# Patient Record
Sex: Male | Born: 1935 | Race: White | Hispanic: No | Marital: Married | Smoking: Former smoker
Health system: Southern US, Community
[De-identification: ages and names within clinical notes are randomized; demographics above are authoritative.]

## PROBLEM LIST (undated history)

## (undated) DIAGNOSIS — I10 Essential (primary) hypertension: Secondary | ICD-10-CM

## (undated) DIAGNOSIS — I509 Heart failure, unspecified: Secondary | ICD-10-CM

## (undated) DIAGNOSIS — Z87442 Personal history of urinary calculi: Secondary | ICD-10-CM

## (undated) DIAGNOSIS — M21371 Foot drop, right foot: Secondary | ICD-10-CM

## (undated) DIAGNOSIS — I2510 Atherosclerotic heart disease of native coronary artery without angina pectoris: Secondary | ICD-10-CM

## (undated) DIAGNOSIS — M1990 Unspecified osteoarthritis, unspecified site: Secondary | ICD-10-CM

## (undated) DIAGNOSIS — G250 Essential tremor: Secondary | ICD-10-CM

## (undated) DIAGNOSIS — M25512 Pain in left shoulder: Secondary | ICD-10-CM

## (undated) DIAGNOSIS — E785 Hyperlipidemia, unspecified: Secondary | ICD-10-CM

## (undated) DIAGNOSIS — M542 Cervicalgia: Secondary | ICD-10-CM

## (undated) DIAGNOSIS — J449 Chronic obstructive pulmonary disease, unspecified: Secondary | ICD-10-CM

## (undated) DIAGNOSIS — I219 Acute myocardial infarction, unspecified: Secondary | ICD-10-CM

## (undated) DIAGNOSIS — I5030 Unspecified diastolic (congestive) heart failure: Secondary | ICD-10-CM

## (undated) DIAGNOSIS — G4730 Sleep apnea, unspecified: Secondary | ICD-10-CM

## (undated) HISTORY — DX: Acute myocardial infarction, unspecified: I21.9

## (undated) HISTORY — PX: TONSILLECTOMY: SUR1361

## (undated) HISTORY — PX: CORONARY ANGIOPLASTY: SHX604

## (undated) HISTORY — PX: KNEE ARTHROSCOPY: SUR90

## (undated) HISTORY — PX: COLONOSCOPY: SHX174

---

## 1996-09-02 HISTORY — PX: KNEE SURGERY: SHX244

## 2008-02-09 ENCOUNTER — Other Ambulatory Visit: Payer: Self-pay

## 2008-02-09 ENCOUNTER — Inpatient Hospital Stay: Payer: Self-pay | Admitting: Internal Medicine

## 2008-02-19 ENCOUNTER — Ambulatory Visit: Payer: Self-pay | Admitting: Cardiovascular Disease

## 2009-01-06 ENCOUNTER — Observation Stay: Payer: Self-pay | Admitting: Internal Medicine

## 2009-07-05 ENCOUNTER — Ambulatory Visit: Payer: Self-pay | Admitting: Gastroenterology

## 2010-06-02 HISTORY — PX: BACK SURGERY: SHX140

## 2010-06-04 ENCOUNTER — Ambulatory Visit: Payer: Self-pay | Admitting: Internal Medicine

## 2010-06-06 ENCOUNTER — Ambulatory Visit: Payer: Self-pay | Admitting: Cardiovascular Disease

## 2010-06-06 ENCOUNTER — Inpatient Hospital Stay (HOSPITAL_COMMUNITY): Admission: EM | Admit: 2010-06-06 | Discharge: 2010-06-13 | Payer: Self-pay | Admitting: Emergency Medicine

## 2010-11-15 LAB — BASIC METABOLIC PANEL
BUN: 28 mg/dL — ABNORMAL HIGH (ref 6–23)
CO2: 26 mEq/L (ref 19–32)
CO2: 31 mEq/L (ref 19–32)
CO2: 31 mEq/L (ref 19–32)
Calcium: 9.3 mg/dL (ref 8.4–10.5)
Chloride: 102 mEq/L (ref 96–112)
Chloride: 103 mEq/L (ref 96–112)
Creatinine, Ser: 1.19 mg/dL (ref 0.4–1.5)
Creatinine, Ser: 1.3 mg/dL (ref 0.4–1.5)
Glucose, Bld: 123 mg/dL — ABNORMAL HIGH (ref 70–99)
Glucose, Bld: 135 mg/dL — ABNORMAL HIGH (ref 70–99)
Glucose, Bld: 96 mg/dL (ref 70–99)
Potassium: 3.1 mEq/L — ABNORMAL LOW (ref 3.5–5.1)
Potassium: 3.4 mEq/L — ABNORMAL LOW (ref 3.5–5.1)
Sodium: 139 mEq/L (ref 135–145)

## 2010-11-15 LAB — URINALYSIS, ROUTINE W REFLEX MICROSCOPIC
Bilirubin Urine: NEGATIVE
Glucose, UA: NEGATIVE mg/dL
Ketones, ur: NEGATIVE mg/dL
Leukocytes, UA: NEGATIVE
Nitrite: NEGATIVE
Protein, ur: 100 mg/dL — AB
pH: 7 (ref 5.0–8.0)

## 2010-11-15 LAB — DIFFERENTIAL
Basophils Absolute: 0 10*3/uL (ref 0.0–0.1)
Eosinophils Absolute: 0 10*3/uL (ref 0.0–0.7)
Eosinophils Relative: 0 % (ref 0–5)
Monocytes Absolute: 1.5 10*3/uL — ABNORMAL HIGH (ref 0.1–1.0)

## 2010-11-15 LAB — CBC
HCT: 41.5 % (ref 39.0–52.0)
HCT: 44.7 % (ref 39.0–52.0)
Hemoglobin: 14.2 g/dL (ref 13.0–17.0)
Hemoglobin: 14.8 g/dL (ref 13.0–17.0)
Hemoglobin: 15.3 g/dL (ref 13.0–17.0)
MCH: 30.7 pg (ref 26.0–34.0)
MCH: 31.2 pg (ref 26.0–34.0)
MCH: 31.5 pg (ref 26.0–34.0)
MCH: 31.7 pg (ref 26.0–34.0)
MCHC: 33.8 g/dL (ref 30.0–36.0)
MCHC: 34.2 g/dL (ref 30.0–36.0)
MCHC: 35 g/dL (ref 30.0–36.0)
MCHC: 35.1 g/dL (ref 30.0–36.0)
MCV: 89.1 fL (ref 78.0–100.0)
MCV: 90.3 fL (ref 78.0–100.0)
MCV: 93 fL (ref 78.0–100.0)
Platelets: 150 10*3/uL (ref 150–400)
Platelets: 154 10*3/uL (ref 150–400)
RBC: 4.75 MIL/uL (ref 4.22–5.81)
RDW: 13.9 % (ref 11.5–15.5)

## 2010-11-15 LAB — COMPREHENSIVE METABOLIC PANEL
ALT: 214 U/L — ABNORMAL HIGH (ref 0–53)
Albumin: 3.5 g/dL (ref 3.5–5.2)
Alkaline Phosphatase: 60 U/L (ref 39–117)
Glucose, Bld: 105 mg/dL — ABNORMAL HIGH (ref 70–99)
Potassium: 3.1 mEq/L — ABNORMAL LOW (ref 3.5–5.1)
Sodium: 141 mEq/L (ref 135–145)
Total Protein: 6.2 g/dL (ref 6.0–8.3)

## 2010-11-15 LAB — PROTIME-INR: Prothrombin Time: 14.3 seconds (ref 11.6–15.2)

## 2012-11-30 ENCOUNTER — Inpatient Hospital Stay: Payer: Self-pay | Admitting: Internal Medicine

## 2012-11-30 LAB — CBC WITH DIFFERENTIAL/PLATELET
Basophil #: 0.1 x10 3/mm 3
Basophil %: 0.7 %
Eosinophil #: 0.2 x10 3/mm 3
Eosinophil %: 2.4 %
HCT: 38 % — ABNORMAL LOW
HGB: 13 g/dL
Lymphocyte %: 25.3 %
Lymphs Abs: 2.4 x10 3/mm 3
MCH: 31.3 pg
MCHC: 34.2 g/dL
MCV: 92 fL
Monocyte #: 0.8 "x10 3/mm "
Monocyte %: 9 %
Neutrophil #: 5.9 x10 3/mm 3
Neutrophil %: 62.6 %
Platelet: 136 x10 3/mm 3 — ABNORMAL LOW
RBC: 4.16 x10 6/mm 3 — ABNORMAL LOW
RDW: 14.6 % — ABNORMAL HIGH
WBC: 9.4 x10 3/mm 3

## 2012-11-30 LAB — COMPREHENSIVE METABOLIC PANEL
Albumin: 3.3 g/dL — ABNORMAL LOW (ref 3.4–5.0)
BUN: 22 mg/dL — ABNORMAL HIGH (ref 7–18)
Calcium, Total: 8.2 mg/dL — ABNORMAL LOW (ref 8.5–10.1)
Chloride: 111 mmol/L — ABNORMAL HIGH (ref 98–107)
Co2: 27 mmol/L (ref 21–32)
Creatinine: 1.17 mg/dL (ref 0.60–1.30)
Osmolality: 291 (ref 275–301)
SGPT (ALT): 36 U/L (ref 12–78)
Total Protein: 6.3 g/dL — ABNORMAL LOW (ref 6.4–8.2)

## 2012-12-02 LAB — COMPREHENSIVE METABOLIC PANEL
Alkaline Phosphatase: 64 U/L (ref 50–136)
Calcium, Total: 8.2 mg/dL — ABNORMAL LOW (ref 8.5–10.1)
Osmolality: 287 (ref 275–301)
Sodium: 142 mmol/L (ref 136–145)

## 2012-12-02 LAB — PRO B NATRIURETIC PEPTIDE: B-Type Natriuretic Peptide: 805 pg/mL — ABNORMAL HIGH (ref 0–450)

## 2013-04-14 DIAGNOSIS — M542 Cervicalgia: Secondary | ICD-10-CM | POA: Insufficient documentation

## 2013-04-22 DIAGNOSIS — M75120 Complete rotator cuff tear or rupture of unspecified shoulder, not specified as traumatic: Secondary | ICD-10-CM | POA: Insufficient documentation

## 2014-04-07 DIAGNOSIS — M21371 Foot drop, right foot: Secondary | ICD-10-CM | POA: Insufficient documentation

## 2014-12-23 NOTE — Discharge Summary (Signed)
PATIENT NAME:  Dylan [name], Dylan [name] MR#:  [id number] DATE OF BIRTH:  1936-04-25  DATE OF ADMISSION:  11/30/2012 DATE OF DISCHARGE:  12/02/2012  ADMITTING DIAGNOSIS: Shortness of breath, and generalized edema.   DISCHARGE DIAGNOSES: 1.  Shortness of breath, generalized edema likely due to diastolic congestive heart failure, previous history of having diastolic dysfunction; however, echo done during recent office visit showed no diastolic dysfunction.  2.  Morbid obesity.  3.  Hypertension.  4.  Hyperlipidemia.  5.  History of coronary artery disease.  6.  Ongoing tobacco abuse.  7.  Osteoarthritis.  8.  Possible sleep apnea   PERTINENT LABS AND EVALUATIONS: Admitting glucose 105, BUN 22. Creatinine was 1.17, sodium 144, potassium was 3.3, chloride 111, CO2 was 27, calcium 8.2.   LFTs showed a total protein of 6.3, an albumin of 3.3. The rest of the LFTs were negative.   WBC count was 9.4, hemoglobin 13, platelet count was 136.   PA and lateral chest x-ray showed minimal right lung bases mild interstitial prominence, suggesting possible CHF.   BNP was 805.  CONSULTANTS: Dr. Welton FlakesKhan of cardiology.   HOSPITAL COURSE: Please refer to H and P done by the admitting physician. The patient is a pleasant 79 year old male who was referred to the hospital by Dr. Welton FlakesKhan, his cardiologist, for feeling of increase in swelling, weight gain, shortness of breath. The patient does have a history of diastolic CHF in the past. He had a recent echo done in the office which did not show any diastolic dysfunction. The patient was on hydrochlorothiazide, which was discontinued a few weeks ago. He was referred to the hospital and was started on IV Lasix. The patient showed significant diuresis. He lost approximately 3 to 5 pounds during the hospital stay. He is doing much better, and was seen by Cardiology, and was switched over to p.o. Lasix at this time. His volume status is significantly improved, and he will be discharged  home. Discharge instructions for CHF given.  DISCHARGE MEDICATIONS: Simvastatin daily; propranolol 60, 1 tablet p.o. [name].i.d.; Plavix 75 p.o. daily; multivitamin daily; stool softener 2 tablets at bedtime; lisinopril 40 daily; hydralazine 100, 1 tablet p.o. [name].i.d.; Celebrex 100, 2 tablets daily; doxazosin 8 mg at bedtime; aspirin 81, 1 tablet p.o. daily; alprazolam 0.5 at bedtime; Lasix 20, 1 tablet p.o. [name].i.d.; KCl 20, 1 tablet p.o. [name].i.d.   DIET: Low sodium, low fat, low cholesterol.   ACTIVITY: As tolerated.   Follow-up with primary M.D. in 1 to 2 weeks. Follow up with Dr. Welton FlakesKhan next week as scheduled. Check a BNP at the time of visit to Dr. Welton FlakesKhan or Dr. Dewaine Oatsenny Tate, his regular doctor.   Note: 35 minutes spent.    ____________________________ Lacie ScottsShreyang H. Allena KatzPatel, MD shp:dm D: 12/03/2012 08:30:00 ET T: 12/03/2012 08:43:52 ET JOB#: [id number]  cc: Camryn Quesinberry H. Allena KatzPatel, MD, <Dictator> Charise CarwinSHREYANG H Ninoska Goswick MD ELECTRONICALLY SIGNED 12/06/2012 19:34

## 2014-12-23 NOTE — Consult Note (Signed)
PATIENT NAME:  Dylan [name], Dylan [name] MR#:  [id number] DATE OF BIRTH:  May 31, 1936  DATE OF CONSULTATION:  12/01/2012  REFERRING PHYSICIAN:  Dr. Cherlynn KaiserSainani. CONSULTING PHYSICIAN:  Verta EllenMonica A. Meda Dudzinski, PA-C dictating for Dr. Adrian BlackwaterShaukat Khan.  REASON FOR CONSULTATION: Edema, anasarca.   HISTORY OF PRESENT ILLNESS: The patient is a 79 year old white male who was referred for admission after being seen in the office yesterday. The patient notes that since last Friday, he has had increased edema with increased abdominal girth, pedal edema, shortness of breath, and orthopnea. He was recently on a blood pressure medication with hydrochlorothiazide which was discontinued. He denies any chest pain, chest pressure, arm pain, and jaw pain. Per his wife, he has had elevated blood pressure as high as 220/97 over the last weekend. At his office visit, he was also noted to have some reduced urinary output. He had had labs by his primary care physician, Dr. Dewaine Oatsenny Tate, last Friday, but we did not have the labs at that time for review.   Since the patient has been admitted here, he was given IV Lasix and has had improvement in his shortness of breath, abdominal edema and pedal edema. He still has some shortness of breath and orthopnea though his symptoms are much improved.   PAST MEDICAL HISTORY:  1. Coronary artery disease. Last cardiac catheterization with dilated left ventricle, normal left ventricular ejection fraction, moderate 40 to 50% disease in the mid-LAD and 40% in the mid-left circumflex, but have ectatic almost aneurysmally dilated RCA with irregularities in the mid-portion of the RCA, currently being treated medically.  2. History of cardiomyopathy.  3. Hypertension.  4. Obesity.  5. Essential tremor.  6. BPH. 7. Osteoarthritis.   SURGERIES: Back surgery.  ALLERGIES: No known drug allergies.   HOME MEDICATIONS:  1. Celebrex 200 mg 1 tablet p.o. daily.  2. Centrum Silver multivitamin 1 tablet daily.  3. CVS  stool softener 100 mg daily as needed.  4. Doxazosin mesylate 8 mg p.o. daily.  5. Aspirin 81 mg p.o. daily.  6. Hydralazine 100 mg p.o. twice daily (due to his high blood pressure, it was recommended he increase his dose to 3 times a day).  7. Lisinopril 40 mg 1 tablet p.o. daily.  8. Plavix 75 mg 1 tablet p.o. daily.  9. Potassium chloride 20 mEq 1 tablet p.o. daily.  10. Propranolol 40 mg 1 tablet p.o. twice daily.  11. Simvastatin 20 mg 1 tablet p.o. at bedtime.   SOCIAL HISTORY: The patient smokes cigars occasionally, smokes 4 to 5 cigarettes per day for the last 40 to 50 years, denies alcohol abuse, denies illicit drug abuse. He lives at home with his wife.   FAMILY HISTORY: Mother died of complications of colon cancer, father had Parkinson's disease.   REVIEW OF SYSTEMS: CONSTITUTIONAL: The patient denies any fever, weight gain of 15 to 20 pounds.  EYES: No blurry vision, double vision.  ENT: No tinnitus. No postnasal drip.  RESPIRATORY: The patient complains of shortness of breath, worsening orthopnea.  CARDIOVASCULAR: No chest pain, palpitations, syncope.  GASTROINTESTINAL: The patient denies any nausea and vomiting, but has had increased abdominal girth and tightness.  HEMATOLOGICAL: The patient has a bruise on the left lower quadrant.  MUSCULOSKELETAL: The patient is positive for swelling.   PHYSICAL EXAMINATION:  GENERAL: This is a pleasant male who is not in any acute distress. He is alert and oriented x 3 and sitting up in bed.  VITAL SIGNS: With a temperature of  97.8 degrees Fahrenheit, heart rate is 53, respiratory rate is 22, blood pressure 173/79, O2 sat is 96% on room air.  HEENT: Head atraumatic, normocephalic. Eyes: Pupils are round, equal. There is no scleral icterus. Conjunctivae are pink. Ears and nose are normal to external inspection. Mouth: Poor dentition.  NECK: Neck is supple. Trachea is midline. There are no carotid bruits noted, no JVD.  LUNGS: Clear to  auscultation bilaterally with some diminished breath sounds, no adventitious breath sounds can be appreciated.  CARDIOVASCULAR: Regular rate and rhythm. No murmurs, rubs, or clicks.  ABDOMEN: Obese. Bowel sounds are present in all four quadrants. It is soft, nontender to palpation.  EXTREMITIES: The patient has skin discoloration consistent with chronic venous changes, 2+ edema from the knees to the ankles bilaterally.   ANCILLARY DATA: EKG done in our office 11/30/2012 with sinus bradycardia 58 beats per minute, first-degree AV block, nonspecific ST and T changes.   Echocardiogram done in our office 11/30/2012 with normal left ventricular ejection fraction of 60%, mild left ventricular hypertrophy, mildly dilated left and right atria, trace to mild aortic regurgitation, mild to moderate mitral regurgitation, mild tricuspid regurgitation. This was a technically difficult study. (Echocardiogram done in November 2013 also showed some mild diastolic dysfunction).   Labs done by Dr. Dewaine Oats on 11/27/2012 with a glucose 106, BUN 25, creatinine 1.17, sodium 144, potassium 4.2, chloride 108, total protein 6, albumin 3.9, bilirubin 0.6, alkaline phosphatase 67, AST 22, ALT 25, total cholesterol 84, HDL 35, triglycerides 70, LDL 35, TSH 2.74   Labs done at [facility] on 11/30/2012 with glucose of 105, BUN 22, creatinine 1.17, sodium 144, potassium 3.3, chloride 111, CO2 is 27, estimated GFR is greater than 60, total protein is 6.3, albumin is 3.3, total bilirubin 0.6, alkaline phosphatase 78, AST is 28, ALT is 36. White blood cell count is 9.4, hemoglobin is 13, hematocrit 38, platelet count 136,000, MCV is 92.   ASSESSMENT/PLAN:  1. Anasarca/worsening peripheral edema. The patient's edema probably multifactorial as he did have some mild diastolic congestive heart failure on his last echocardiogram in November of 2013. Since he has recently started hydrochlorothiazide as part of his blood pressure  medicines, he probably has fluid overload. Echocardiogram done in our office yesterday on 11/30/2012 as noted above. The patient has improved with Lasix 40 mg IV which should be continued and he notes that he has increased urinary output. The patient does have some hypoalbuminemia that may be contributing to symptoms as well. The patient will need to be discharged on diuretic  to avoid fluid overload in the future. 2. Hypertension. The patient is currently taking hydralazine 100 mg [name].i.d., lisinopril 40 mg daily, propranolol 60 mg [name].i.d. If BPs remain elevated, would increase hydralazine to 100 mg 3 times daily.  3. Hyperlipidemia. The patient should continue simvastatin.  4. Benign prostatic hypertrophy. Continue doxazosin. 5. History of coronary artery disease, stable. Continue aspirin, Plavix, statin, beta blocker.  Thank you very much for this consultation and allowing [place] to participate in this patient's care.    ____________________________ Verta Ellen, PA-C mam:es D: 12/01/2012 08:36:10 ET T: 12/01/2012 09:05:20 ET JOB#: [id number]  cc: Verta Ellen, PA-C, <Dictator> Jillene Bucks. Arlana Pouch, MD Tysheena Ginzburg A [facility] PA ELECTRONICALLY SIGNED 12/01/2012 12:07

## 2014-12-23 NOTE — Consult Note (Signed)
Brief Consult Note: Diagnosis: Anasarca/edema.   Patient was seen by consultant.   Consult note dictated.   Comments: Edema/anasarca likely multifactorial as has low protein & albumin and likely diastolic dysfx. Pt c/o decreased urinary output at office, but has had good output during hospitalization. His echo done in office yesterday was a technically difficult study with LVEF60%, mild LVH, mildly dilated atria, trace to mild AR, mild to moderate MR, mild TR. No diastolic dysfx noted, but had mild diastolic dysfx on last echo. Patient feeling better (less SOB and reduction in pedal edema) with IV Lasix which will be continued. If BP elevated, can increase hydralazine from 100mg  BID to 100mg  TID. Will follow patient with you.  Electronic Signatures: Radene KneeKhan, Shaukat Ali (MD)   (Signed 03-Apr-14 09:26)  Co-Signer: Brief Consult Note Maia PlanManzi, Darcel Frane A (PA-C)   (Signed 01-Apr-14 08:25)  Authored: Brief Consult Note  Last Updated: 03-Apr-14 09:26 by Radene KneeKhan, Shaukat Ali (MD)

## 2014-12-23 NOTE — H&P (Signed)
PATIENT NAME:  Dylan [name], Dylan [name] MR#:  [id number] DATE OF BIRTH:  [birth year]  DATE OF ADMISSION:  11/30/2012  PRIMARY CARE PHYSICIAN:  Katherina Right C. Arlana Pouch, MD  CARDIOLOGIST:  Laurier Nancy, MD  CHIEF COMPLAINT:  Generalized edema and also increasing peripheral edema.   HISTORY OF PRESENT ILLNESS:  This is a 79 year old male who was referred to the hospital by his cardiologist, Dr. Welton Flakes, due to feeling increasingly bloated and also having increasing weight gain and also increasing peripheral edema and generalized anasarca. The patient says that he was taken off his diuretics, HCTZ and possibly Lasix a few days back. Since then, the patient says that he has been feeling more and more bloated and feels like he is swelling up all over. He denies any shortness of breath or any paroxysmal nocturnal dyspnea. He denies any chest pain. He denies any fevers, chills, cough or any upper respiratory symptoms. He denies any diarrhea, constipation. Because of the fact that he was feeling  more and more bloated and swollen, he was sent over to the hospital for further evaluation.   REVIEW OF SYSTEMS:  CONSTITUTIONAL: No documented fever. Positive weight gain about 15 to 20 pounds, no weight loss.  EYES: No blurry or double vision.  ENT: No tinnitus, no postnasal drip, no redness of the oropharynx.  RESPIRATORY: No cough, no wheeze, hemoptysis, no dyspnea.  CARDIOVASCULAR: No chest pain, no orthopnea, no palpitations, no syncope.  GASTROINTESTINAL: No nausea. No vomiting, no diarrhea, no abdominal pain, no melena, no hematochezia.  GENITOURINARY: No dysuria, no hematuria.  ENDOCRINE: No polyuria or nocturia.  HEMATOLOGIC: No anemia, no bruising, no bleeding.  INTEGUMENTARY: No rashes. No lesions.  MUSCULOSKELETAL: No arthritis. No swelling. No gout.  NEUROLOGIC: No numbness, no tingling, no ataxia. No seizure-type activity.  PSYCHIATRIC: No anxiety, no insomnia, no ADD.   PAST MEDICAL HISTORY:  Consistent with  hypertension, hyperlipidemia, morbid obesity, history of coronary artery disease, history of diastolic congestive heart failure, ongoing tobacco abuse, osteoarthritis.   ALLERGIES:  No known drug allergies.   SOCIAL HISTORY:  Still smokes about 4 to 5 cigarettes per day, has been a smoker for the past 40 to 50 years. No alcohol abuse. No illicit drug abuse. Lives at home with his wife.   FAMILY HISTORY:  Mother died from complications of colon cancer. Father had Parkinson's disease and also heart disease.   CURRENT MEDICATIONS:  Xanax 0.5 mg daily, aspirin 81 mg daily, Celebrex 200 mg daily, Plavix 75 mg daily, doxazosin 8 mg at bedtime, hydralazine 100 mg b.i.d., lisinopril 40 mg daily, multivitamin daily, potassium 20 mEq b.i.d., propranolol 60 mg b.i.d., simvastatin 10 mg daily and stool softener daily.   PHYSICAL EXAMINATION ON ADMISSION:  VITAL SIGNS: Temperature is 95.1, pulse 63, respirations 20, blood pressure 130/77, saturation 97% on room air.  GENERAL: He is a pleasant appearing male in no apparent distress.  HEENT: Atraumatic, normocephalic. Extraocular muscles are intact. Pupils equal and reactive to light. Sclerae anicteric. No conjunctival injection. No pharyngeal erythema.  NECK: Supple. There is no jugular venous distention, no bruits, no lymphadenopathy or thyromegaly.  HEART: Regular rate and rhythm. No murmurs, rubs or clicks.  LUNGS: Clear to auscultation bilaterally. No rales or rhonchi. No wheezes.  ABDOMEN: Soft, flat, nontender, nondistended. Has good bowel sounds. No hepatosplenomegaly appreciated.  EXTREMITIES: No evidence of any cyanosis or clubbing. He does have +2 pitting edema from the knees to the ankles bilaterally, faint pedal pulses and +2 radial pulses bilaterally.  NEUROLOGICAL: The patient is alert, awake and oriented x 3 with no focal, motor or sensory deficits appreciated bilaterally.  SKIN: Moist and warm with no rash appreciated.  LYMPHATIC: There is no  cervical or axillary lymphadenopathy.   LABORATORY DATA:  Still pending.   ASSESSMENT AND PLAN:  This is a 79 year old male with a history of hypertension, benign prostatic hypertrophy, history of diastolic congestive heart failure, hyperlipidemia, anxiety, ongoing tobacco abuse, morbid obesity, history of coronary artery disease who presents to the hospital with lower extremity edema and feeling swollen all over. He is noted to have generalized anasarca with the peripheral edema.  1.  Anasarca/worsening peripheral edema. The exact etiology is probably related to underlying diastolic congestive heart failure. The patient was recently taken off his diuretics a few days back. He had an echocardiogram done at his cardiologist's office, Dr. Welton FlakesKhan, which showed a normal ejection fraction and no left ventricular dysfunction presently and no evidence of any pericardial effusion. I will start diuresing him with intravenous Lasix, follow ins and outs and daily weights. I will also check an albumin level to rule out any evidence of hypoalbuminemia causing his peripheral edema.  2.  Hypertension. The patient is presently hemodynamically stable. I will continue his hydralazine, lisinopril and propranolol for now.  3.  Hyperlipidemia. Continue with simvastatin.  4.  Benign prostatic hypertrophy. Continue doxazosin.  5.  History of coronary artery disease. Continue aspirin, Plavix, propranolol and simvastatin.  6.  History of osteoarthritis. Continue Celebrex.  7.  History of diastolic congestive heart failure. As mentioned, I will diurese the patient with intravenous Lasix, follow ins and outs and daily weights, continue his propranolol, continue his angiotensin-converting enzyme inhibitor as stated.   CODE STATUS:  THE PATIENT IS A FULL CODE.   TIME SPENT ON ADMISSION:  50 minutes.    ____________________________ Rolly PancakeVivek J. Cherlynn KaiserSainani, MD vjs:si D: 11/30/2012 14:59:00 ET T: 11/30/2012 15:38:25  ET JOB#: [id number]  cc: Rolly PancakeVivek J. Cherlynn KaiserSainani, MD, <Dictator> Houston SirenVIVEK J Denesha Brouse MD ELECTRONICALLY SIGNED 12/02/2012 21:17

## 2015-12-14 ENCOUNTER — Inpatient Hospital Stay
Admission: EM | Admit: 2015-12-14 | Discharge: 2015-12-16 | DRG: 292 | Disposition: A | Discharge status: Home / Self Care | Payer: Medicare Other | Attending: Specialist | Admitting: Specialist

## 2015-12-14 ENCOUNTER — Encounter: Payer: Self-pay | Admitting: Emergency Medicine

## 2015-12-14 ENCOUNTER — Emergency Department: Payer: Medicare Other

## 2015-12-14 DIAGNOSIS — I110 Hypertensive heart disease with heart failure: Secondary | ICD-10-CM | POA: Diagnosis not present

## 2015-12-14 DIAGNOSIS — Z833 Family history of diabetes mellitus: Secondary | ICD-10-CM

## 2015-12-14 DIAGNOSIS — I2510 Atherosclerotic heart disease of native coronary artery without angina pectoris: Secondary | ICD-10-CM | POA: Diagnosis present

## 2015-12-14 DIAGNOSIS — Z79899 Other long term (current) drug therapy: Secondary | ICD-10-CM

## 2015-12-14 DIAGNOSIS — I5033 Acute on chronic diastolic (congestive) heart failure: Secondary | ICD-10-CM | POA: Diagnosis present

## 2015-12-14 DIAGNOSIS — J209 Acute bronchitis, unspecified: Secondary | ICD-10-CM | POA: Diagnosis present

## 2015-12-14 DIAGNOSIS — R0602 Shortness of breath: Secondary | ICD-10-CM | POA: Diagnosis not present

## 2015-12-14 DIAGNOSIS — M1990 Unspecified osteoarthritis, unspecified site: Secondary | ICD-10-CM | POA: Diagnosis present

## 2015-12-14 DIAGNOSIS — Z7982 Long term (current) use of aspirin: Secondary | ICD-10-CM

## 2015-12-14 DIAGNOSIS — R7989 Other specified abnormal findings of blood chemistry: Secondary | ICD-10-CM

## 2015-12-14 DIAGNOSIS — E785 Hyperlipidemia, unspecified: Secondary | ICD-10-CM | POA: Diagnosis present

## 2015-12-14 DIAGNOSIS — F17210 Nicotine dependence, cigarettes, uncomplicated: Secondary | ICD-10-CM | POA: Diagnosis present

## 2015-12-14 DIAGNOSIS — I248 Other forms of acute ischemic heart disease: Secondary | ICD-10-CM | POA: Diagnosis present

## 2015-12-14 DIAGNOSIS — I509 Heart failure, unspecified: Secondary | ICD-10-CM

## 2015-12-14 DIAGNOSIS — R778 Other specified abnormalities of plasma proteins: Secondary | ICD-10-CM

## 2015-12-14 HISTORY — DX: Essential tremor: G25.0

## 2015-12-14 HISTORY — DX: Unspecified osteoarthritis, unspecified site: M19.90

## 2015-12-14 HISTORY — DX: Foot drop, right foot: M21.371

## 2015-12-14 HISTORY — DX: Cervicalgia: M54.2

## 2015-12-14 HISTORY — DX: Unspecified diastolic (congestive) heart failure: I50.30

## 2015-12-14 HISTORY — DX: Essential (primary) hypertension: I10

## 2015-12-14 HISTORY — DX: Pain in left shoulder: M25.512

## 2015-12-14 HISTORY — DX: Hyperlipidemia, unspecified: E78.5

## 2015-12-14 HISTORY — DX: Atherosclerotic heart disease of native coronary artery without angina pectoris: I25.10

## 2015-12-14 LAB — CBC
HCT: 41.1 % (ref 40.0–52.0)
HEMOGLOBIN: 13.8 g/dL (ref 13.0–18.0)
MCH: 31.6 pg (ref 26.0–34.0)
MCHC: 33.6 g/dL (ref 32.0–36.0)
MCV: 93.9 fL (ref 80.0–100.0)
Platelets: 111 10*3/uL — ABNORMAL LOW (ref 150–440)
RBC: 4.38 MIL/uL — AB (ref 4.40–5.90)
RDW: 14.1 % (ref 11.5–14.5)
WBC: 7 10*3/uL (ref 3.8–10.6)

## 2015-12-14 LAB — BASIC METABOLIC PANEL
ANION GAP: 6 (ref 5–15)
BUN: 24 mg/dL — ABNORMAL HIGH (ref 6–20)
CALCIUM: 8.7 mg/dL — AB (ref 8.9–10.3)
CO2: 24 mmol/L (ref 22–32)
Chloride: 106 mmol/L (ref 101–111)
Creatinine, Ser: 1.1 mg/dL (ref 0.61–1.24)
GLUCOSE: 105 mg/dL — AB (ref 65–99)
Potassium: 4 mmol/L (ref 3.5–5.1)
SODIUM: 136 mmol/L (ref 135–145)

## 2015-12-14 LAB — TROPONIN I: Troponin I: 0.07 ng/mL — ABNORMAL HIGH (ref <0.031)

## 2015-12-14 MED ORDER — METHYLPREDNISOLONE SODIUM SUCC 125 MG IJ SOLR
125.0000 mg | Freq: Once | INTRAMUSCULAR | Status: AC
  Administered 2015-12-15: 125 mg via INTRAVENOUS
  Filled 2015-12-14: qty 2

## 2015-12-14 MED ORDER — IPRATROPIUM-ALBUTEROL 0.5-2.5 (3) MG/3ML IN SOLN
3.0000 mL | Freq: Once | RESPIRATORY_TRACT | Status: AC
Start: 2015-12-14 — End: 2015-12-14
  Administered 2015-12-14: 3 mL via RESPIRATORY_TRACT
  Filled 2015-12-14: qty 3

## 2015-12-14 NOTE — ED Provider Notes (Signed)
[facility] Emergency Department Provider Note    ____________________________________________  Time seen: ~2115  I have reviewed the triage vital signs and the nursing notes.   HISTORY  Chief Complaint Shortness of Breath and Cough   History limited by: Not Limited   HPI Dylan [name] is a 80 y.o. male who presents to the emergency department today because of concerns for cough congestion shortness of breath. Patient states that the symptoms started 1-2 days ago. He states he did have a sick contact in that his wife had similar symptoms earlier in the week. Patient states that he has not been having any significant pain in his chest. He denies any fevers. He denies any productive cough. He went to urgent care where an x-ray showed a pleural effusion and he was sent to the emergency department.  In addition the patient states heart rate is typically not low.    Past Medical History  Diagnosis Date  . Osteoarthritis   . Tremor, essential   . Foot drop, right   . Cervicalgia   . Shoulder pain, left     There are no active problems to display for this patient.   Past Surgical History  Procedure Laterality Date  . Brain surgery    . Knee surgery      No current outpatient prescriptions on file.  Allergies Review of patient's allergies indicates no known allergies.  No family history on file.  Social History Social History  Substance Use Topics  . Smoking status: Current Every Day Smoker -- 0.50 packs/day    Types: Cigarettes  . Smokeless tobacco: None  . Alcohol Use: None    Review of Systems  Constitutional: Negative for fever. Cardiovascular: Negative for chest pain. Respiratory: Positive for shortness of breath, cough Gastrointestinal: Negative for abdominal pain, vomiting and diarrhea. Neurological: Negative for headaches, focal weakness or numbness.  10-point ROS otherwise  negative.  ____________________________________________   PHYSICAL EXAM:  VITAL SIGNS: ED Triage Vitals  Enc Vitals Group     BP 12/14/15 1937 161/67 mmHg     Pulse Rate 12/14/15 1937 55     Resp 12/14/15 1937 22     Temp 12/14/15 1937 98.3 F (36.8 C)     Temp Source 12/14/15 1937 Oral     SpO2 12/14/15 1937 94 %     Weight 12/14/15 1937 292 lb (132.45 kg)     Height 12/14/15 1937 5\' 11"  (1.803 m)   Constitutional: Alert and oriented. Well appearing and in no distress. Eyes: Conjunctivae are normal. PERRL. Normal extraocular movements. ENT   Head: Normocephalic and atraumatic.   Nose: No congestion/rhinnorhea.   Mouth/Throat: Mucous membranes are moist.   Neck: No stridor. Hematological/Lymphatic/Immunilogical: No cervical lymphadenopathy. Cardiovascular: Bradycardic, regular rhythm.  No murmurs, rubs, or gallops. Respiratory: Mildly increased respiratory effort. Bilateral expiratory wheezing. Gastrointestinal: Soft and nontender. No distention.  Genitourinary: Deferred Musculoskeletal: Normal range of motion in all extremities. No joint effusions.  No lower extremity tenderness nor edema. Neurologic:  Normal speech and language. No gross focal neurologic deficits are appreciated.  Skin:  Skin is warm, dry and intact. No rash noted. Psychiatric: Mood and affect are normal. Speech and behavior are normal. Patient exhibits appropriate insight and judgment.  ____________________________________________    LABS (pertinent positives/negatives)  Labs Reviewed  BASIC METABOLIC PANEL - Abnormal; Notable for the following:    Glucose, Bld 105 (*)    BUN 24 (*)    Calcium 8.7 (*)  All other components within normal limits  CBC - Abnormal; Notable for the following:    RBC 4.38 (*)    Platelets 111 (*)    All other components within normal limits  TROPONIN I - Abnormal; Notable for the following:    Troponin I 0.07 (*)    All other components within normal  limits  TROPONIN I  BRAIN NATRIURETIC PEPTIDE     ____________________________________________   EKG  I, Phineas Semen, attending physician, personally viewed and interpreted this EKG  EKG Time: 1933 Rate: 55 Rhythm: sinus bradycardia with 1st degree av block Axis: left axis deviation Intervals: qtc 472 QRS: nonspecific intraventricular block ST changes: no st elevation Impression: abnormal ekg  ____________________________________________    RADIOLOGY  CXR IMPRESSION: Tiny amount of right pleural fluid unchanged. ____________________________________________   PROCEDURES  Procedure(s) performed: None  Critical Care performed: No  ____________________________________________   INITIAL IMPRESSION / ASSESSMENT AND PLAN / ED COURSE  Pertinent labs & imaging results that were available during my care of the patient were reviewed by me and considered in my medical decision making (see chart for details).   Patient presented to the emergency department today because of concerns for cough and shortness of breath. On exam patient does have diffuse wheezing. No history of COPD. His troponin was elevated 0.07. Patient did not have any pain in EKG does not show any ST elevation however this is a concerning finding. Furthermore the patient was bradycardic during his time here in the emergency department. Y says this is quite unusual. Will plan on admission for further workup and evaluation. ____________________________________________   FINAL CLINICAL IMPRESSION(S) / ED DIAGNOSES  Final diagnoses:  Shortness of breath  Elevated troponin     Phineas Semen, MD 12/14/15 2355

## 2015-12-14 NOTE — ED Notes (Signed)
Pt bradycardic(55), MD made aware.

## 2015-12-14 NOTE — ED Notes (Signed)
Patient with complaint of cough, congestion and shortness of breath that started yesterday. Patient reports that it became worse today. Patient states that he was sent her by [facility] urgent care. Patient states that they did an x-ray and told him that he has fluid in his lung.

## 2015-12-14 NOTE — ED Notes (Signed)
Critical lab value troponin 0.07, MD made aware

## 2015-12-15 ENCOUNTER — Inpatient Hospital Stay
Admit: 2015-12-15 | Discharge: 2015-12-15 | Disposition: A | Discharge status: Home / Self Care | Payer: Medicare Other | Attending: Internal Medicine | Admitting: Internal Medicine

## 2015-12-15 ENCOUNTER — Encounter: Payer: Self-pay | Admitting: Internal Medicine

## 2015-12-15 DIAGNOSIS — Z7982 Long term (current) use of aspirin: Secondary | ICD-10-CM | POA: Diagnosis not present

## 2015-12-15 DIAGNOSIS — I2510 Atherosclerotic heart disease of native coronary artery without angina pectoris: Secondary | ICD-10-CM | POA: Diagnosis present

## 2015-12-15 DIAGNOSIS — R0602 Shortness of breath: Secondary | ICD-10-CM | POA: Diagnosis present

## 2015-12-15 DIAGNOSIS — Z79899 Other long term (current) drug therapy: Secondary | ICD-10-CM | POA: Diagnosis not present

## 2015-12-15 DIAGNOSIS — F17210 Nicotine dependence, cigarettes, uncomplicated: Secondary | ICD-10-CM | POA: Diagnosis present

## 2015-12-15 DIAGNOSIS — I110 Hypertensive heart disease with heart failure: Secondary | ICD-10-CM | POA: Diagnosis present

## 2015-12-15 DIAGNOSIS — E785 Hyperlipidemia, unspecified: Secondary | ICD-10-CM | POA: Diagnosis present

## 2015-12-15 DIAGNOSIS — M1990 Unspecified osteoarthritis, unspecified site: Secondary | ICD-10-CM | POA: Diagnosis present

## 2015-12-15 DIAGNOSIS — Z833 Family history of diabetes mellitus: Secondary | ICD-10-CM | POA: Diagnosis not present

## 2015-12-15 DIAGNOSIS — I5033 Acute on chronic diastolic (congestive) heart failure: Secondary | ICD-10-CM | POA: Diagnosis present

## 2015-12-15 DIAGNOSIS — I509 Heart failure, unspecified: Secondary | ICD-10-CM

## 2015-12-15 DIAGNOSIS — I248 Other forms of acute ischemic heart disease: Secondary | ICD-10-CM | POA: Diagnosis present

## 2015-12-15 DIAGNOSIS — J209 Acute bronchitis, unspecified: Secondary | ICD-10-CM | POA: Diagnosis present

## 2015-12-15 LAB — TROPONIN I
TROPONIN I: 0.06 ng/mL — AB (ref <0.031)
Troponin I: 0.05 ng/mL — ABNORMAL HIGH (ref <0.031)
Troponin I: 0.05 ng/mL — ABNORMAL HIGH (ref <0.031)
Troponin I: 0.05 ng/mL — ABNORMAL HIGH (ref <0.031)

## 2015-12-15 LAB — CBC
HCT: 43.2 % (ref 40.0–52.0)
Hemoglobin: 14.6 g/dL (ref 13.0–18.0)
MCH: 31.7 pg (ref 26.0–34.0)
MCHC: 33.8 g/dL (ref 32.0–36.0)
MCV: 93.7 fL (ref 80.0–100.0)
PLATELETS: 112 10*3/uL — AB (ref 150–440)
RBC: 4.61 MIL/uL (ref 4.40–5.90)
RDW: 14 % (ref 11.5–14.5)
WBC: 6.4 10*3/uL (ref 3.8–10.6)

## 2015-12-15 LAB — BRAIN NATRIURETIC PEPTIDE: B Natriuretic Peptide: 176 pg/mL — ABNORMAL HIGH (ref 0.0–100.0)

## 2015-12-15 LAB — CREATININE, SERUM: Creatinine, Ser: 1.1 mg/dL (ref 0.61–1.24)

## 2015-12-15 MED ORDER — IPRATROPIUM-ALBUTEROL 0.5-2.5 (3) MG/3ML IN SOLN
3.0000 mL | Freq: Four times a day (QID) | RESPIRATORY_TRACT | Status: DC
  Administered 2015-12-15 – 2015-12-16 (×4): 3 mL via RESPIRATORY_TRACT
  Filled 2015-12-15 (×5): qty 3

## 2015-12-15 MED ORDER — GUAIFENESIN ER 600 MG PO TB12
600.0000 mg | ORAL_TABLET | Freq: Two times a day (BID) | ORAL | Status: DC
  Administered 2015-12-15 – 2015-12-16 (×3): 600 mg via ORAL
  Filled 2015-12-15 (×3): qty 1

## 2015-12-15 MED ORDER — ENOXAPARIN SODIUM 40 MG/0.4ML ~~LOC~~ SOLN
40.0000 mg | Freq: Two times a day (BID) | SUBCUTANEOUS | Status: DC
  Filled 2015-12-15 (×2): qty 0.4

## 2015-12-15 MED ORDER — PROPRANOLOL HCL ER 120 MG PO CP24
120.0000 mg | ORAL_CAPSULE | Freq: Every day | ORAL | Status: DC
  Administered 2015-12-15: 120 mg via ORAL
  Filled 2015-12-15 (×2): qty 1

## 2015-12-15 MED ORDER — DOXAZOSIN MESYLATE 8 MG PO TABS
8.0000 mg | ORAL_TABLET | Freq: Every day | ORAL | Status: DC
  Administered 2015-12-15: 8 mg via ORAL
  Filled 2015-12-15 (×3): qty 1

## 2015-12-15 MED ORDER — SODIUM CHLORIDE 0.9% FLUSH: 3.0000 mL | INTRAVENOUS | Status: DC | PRN

## 2015-12-15 MED ORDER — SPIRONOLACTONE 25 MG PO TABS
25.0000 mg | ORAL_TABLET | Freq: Every day | ORAL | Status: DC
  Administered 2015-12-15: 25 mg via ORAL
  Filled 2015-12-15 (×2): qty 1

## 2015-12-15 MED ORDER — ENOXAPARIN SODIUM 40 MG/0.4ML ~~LOC~~ SOLN: 40.0000 mg | SUBCUTANEOUS | Status: DC

## 2015-12-15 MED ORDER — ACETAMINOPHEN 325 MG PO TABS: 650.0000 mg | ORAL_TABLET | ORAL | Status: DC | PRN

## 2015-12-15 MED ORDER — FUROSEMIDE 10 MG/ML IJ SOLN
40.0000 mg | Freq: Two times a day (BID) | INTRAMUSCULAR | Status: DC
  Administered 2015-12-15 – 2015-12-16 (×3): 40 mg via INTRAVENOUS
  Filled 2015-12-15 (×3): qty 4

## 2015-12-15 MED ORDER — ENALAPRIL MALEATE 10 MG PO TABS
10.0000 mg | ORAL_TABLET | Freq: Every day | ORAL | Status: DC
  Administered 2015-12-15: 10 mg via ORAL
  Filled 2015-12-15 (×2): qty 1

## 2015-12-15 MED ORDER — POTASSIUM CHLORIDE CRYS ER 20 MEQ PO TBCR
20.0000 meq | EXTENDED_RELEASE_TABLET | Freq: Two times a day (BID) | ORAL | Status: DC
  Administered 2015-12-15 – 2015-12-16 (×3): 20 meq via ORAL
  Filled 2015-12-15 (×3): qty 1

## 2015-12-15 MED ORDER — ASPIRIN EC 81 MG PO TBEC
81.0000 mg | DELAYED_RELEASE_TABLET | Freq: Every day | ORAL | Status: DC
  Administered 2015-12-15 – 2015-12-16 (×2): 81 mg via ORAL
  Filled 2015-12-15 (×2): qty 1

## 2015-12-15 MED ORDER — PREDNISONE 50 MG PO TABS
50.0000 mg | ORAL_TABLET | Freq: Every day | ORAL | Status: DC
  Administered 2015-12-15 – 2015-12-16 (×2): 50 mg via ORAL
  Filled 2015-12-15 (×2): qty 1

## 2015-12-15 MED ORDER — PRIMIDONE 50 MG PO TABS
50.0000 mg | ORAL_TABLET | Freq: Every day | ORAL | Status: DC
  Administered 2015-12-15: 50 mg via ORAL
  Filled 2015-12-15: qty 1

## 2015-12-15 MED ORDER — GUAIFENESIN-DM 100-10 MG/5ML PO SYRP
5.0000 mL | ORAL_SOLUTION | ORAL | Status: DC | PRN
  Administered 2015-12-15 (×2): 5 mL via ORAL
  Filled 2015-12-15 (×2): qty 5

## 2015-12-15 MED ORDER — ONDANSETRON HCL 4 MG/2ML IJ SOLN: 4.0000 mg | Freq: Four times a day (QID) | INTRAMUSCULAR | Status: DC | PRN

## 2015-12-15 MED ORDER — LEVOFLOXACIN 500 MG PO TABS
500.0000 mg | ORAL_TABLET | Freq: Every day | ORAL | Status: DC
  Administered 2015-12-15 – 2015-12-16 (×2): 500 mg via ORAL
  Filled 2015-12-15 (×2): qty 1

## 2015-12-15 MED ORDER — SODIUM CHLORIDE 0.9% FLUSH
3.0000 mL | Freq: Two times a day (BID) | INTRAVENOUS | Status: DC
  Administered 2015-12-15 (×3): 3 mL via INTRAVENOUS

## 2015-12-15 MED ORDER — HYDRALAZINE HCL 50 MG PO TABS
100.0000 mg | ORAL_TABLET | Freq: Two times a day (BID) | ORAL | Status: DC
  Administered 2015-12-15 – 2015-12-16 (×4): 100 mg via ORAL
  Filled 2015-12-15 (×4): qty 2

## 2015-12-15 MED ORDER — SODIUM CHLORIDE 0.9 % IV SOLN
250.0000 mL | INTRAVENOUS | Status: DC | PRN
Start: 2015-12-15 — End: 2015-12-16

## 2015-12-15 MED ORDER — GUAIFENESIN-DM 100-10 MG/5ML PO SYRP
5.0000 mL | ORAL_SOLUTION | ORAL | Status: DC | PRN
  Administered 2015-12-15: 5 mL via ORAL
  Filled 2015-12-15: qty 5

## 2015-12-15 MED ORDER — ATORVASTATIN CALCIUM 20 MG PO TABS
40.0000 mg | ORAL_TABLET | Freq: Every day | ORAL | Status: DC
  Administered 2015-12-15: 40 mg via ORAL
  Filled 2015-12-15: qty 2

## 2015-12-15 NOTE — H&P (Signed)
[facility] - [place] at [facility]   PATIENT NAME: Dylan CharJerry Soliz    MR#:  [id number]  DATE OF BIRTH:  1936/07/27  DATE OF ADMISSION:  12/14/2015  PRIMARY CARE PHYSICIAN: No primary care provider on file.   REQUESTING/REFERRING PHYSICIAN:   CHIEF COMPLAINT:   Chief Complaint  Patient presents with  . Shortness of Breath  . Cough    HISTORY OF PRESENT ILLNESS: Dylan [name]  is a 80 y.o. male with a known history of Hypertension, hyperlipidemia, diastolic heart failure, coronary artery disease presented to the emergency room with cough and congestion for the last 3 days. Patient also complains of shortness of breath for the last 2 days and was worse was yesterday. Patient went to the urgent care and he was worked up with a chest x-ray which showed a pleural effusion and he was referred to the emergency room. No complaints of any chest pain. Has history of orthopnea. No fevers or chills. The cough is dry in nature. No history of recent travel or sick contacts at home, workup in the emergency room showed a borderline troponin of 0.07.Hospitalist service was consulted for further care of the patient.  PAST MEDICAL HISTORY:   Past Medical History  Diagnosis Date  . Osteoarthritis   . Tremor, essential   . Foot drop, right   . Cervicalgia   . Shoulder pain, left   . Hypertension   . Hyperlipidemia   . Diastolic heart failure (HCC)   . CAD (coronary artery disease)     PAST SURGICAL HISTORY: Past Surgical History  Procedure Laterality Date  . Brain surgery    . Knee surgery      SOCIAL HISTORY:  Social History  Substance Use Topics  . Smoking status: Current Every Day Smoker -- 0.50 packs/day    Types: Cigarettes  . Smokeless tobacco: Not on file  . Alcohol Use: No    FAMILY HISTORY:  Family History  Problem Relation Age of Onset  . Diabetes Son     DRUG ALLERGIES: No Known Allergies  REVIEW OF SYSTEMS:   CONSTITUTIONAL: No fever, fatigue or  weakness.  EYES: No blurred or double vision.  EARS, NOSE, AND THROAT: No tinnitus or ear pain.  RESPIRATORY: Has cough, has shortness of breath, no wheezing or hemoptysis.  CARDIOVASCULAR: No chest pain, has orthopnea, has edema.  GASTROINTESTINAL: No nausea, vomiting, diarrhea or abdominal pain.  GENITOURINARY: No dysuria, hematuria.  ENDOCRINE: No polyuria, nocturia,  HEMATOLOGY: No anemia, easy bruising or bleeding SKIN: No rash or lesion. MUSCULOSKELETAL: No joint pain or arthritis.   NEUROLOGIC: No tingling, numbness, weakness.  PSYCHIATRY: No anxiety or depression.   MEDICATIONS AT HOME:  Prior to Admission medications   Medication Sig Start Date End Date Taking? Authorizing Provider  doxazosin (CARDURA) 8 MG tablet Take 1 tablet by mouth daily. 11/16/15  Yes Historical Provider, MD  enalapril-hydrochlorothiazide (VASERETIC) 10-25 MG tablet Take 1 tablet by mouth daily. 12/07/15  Yes Historical Provider, MD  hydrALAZINE (APRESOLINE) 100 MG tablet Take 1 tablet by mouth 2 (two) times daily. 11/16/15  Yes Historical Provider, MD  potassium chloride SA (K-DUR,KLOR-CON) 20 MEQ tablet Take 1 tablet by mouth 2 (two) times daily. 11/16/15  Yes Historical Provider, MD  primidone (MYSOLINE) 50 MG tablet Take 1 tablet by mouth 4 (four) times daily as needed. 11/16/15  Yes Historical Provider, MD  propranolol ER (INDERAL LA) 120 MG 24 hr capsule Take 1 capsule by mouth daily. 11/22/15  Yes  Historical Provider, MD  simvastatin (ZOCOR) 80 MG tablet Take 1 tablet by mouth daily. 11/22/15  Yes Historical Provider, MD  spironolactone (ALDACTONE) 25 MG tablet Take 1 tablet by mouth daily. 12/07/15  Yes Historical Provider, MD      PHYSICAL EXAMINATION:   VITAL SIGNS: Blood pressure 171/86, pulse 66, temperature 98.3 F (36.8 C), temperature source Oral, resp. rate 24, height  (1.803 m), weight 132.45 kg (292 lb), SpO2 99 %.  GENERAL:  79 y.o.-year-old patient lying in the bed with no acute  distress.  EYES: Pupils equal, round, reactive to light and accommodation. No scleral icterus. Extraocular muscles intact.  HEENT: Head atraumatic, normocephalic. Oropharynx and nasopharynx clear.  NECK:  Supple, no jugular venous distention. No thyroid enlargement, no tenderness.  LUNGS: Normal breath sounds bilaterally, no wheezing, basal crepitations heard bilaterally. No use of accessory muscles of respiration.  CARDIOVASCULAR: S1, S2 normal. No murmurs, rubs, or gallops.  ABDOMEN: Soft, nontender, nondistended. Bowel sounds present. No organomegaly or mass.  EXTREMITIES: Has pedal edema, no cyanosis, or clubbing.  NEUROLOGIC: Cranial nerves II through XII are intact. Muscle strength 5/5 in all extremities. Sensation intact. Gait normal. PSYCHIATRIC: The patient is alert and oriented x 3.  SKIN: No obvious rash, lesion, or ulcer.   LABORATORY PANEL:   CBC  Recent Labs Lab 12/14/15 1942  WBC 7.0  HGB 13.8  HCT 41.1  PLT 111*  MCV 93.9  MCH 31.6  MCHC 33.6  RDW 14.1   ------------------------------------------------------------------------------------------------------------------  Chemistries   Recent Labs Lab 12/14/15 1942  NA 136  K 4.0  CL 106  CO2 24  GLUCOSE 105*  BUN 24*  CREATININE 1.10  CALCIUM 8.7*   ------------------------------------------------------------------------------------------------------------------ estimated creatinine clearance is 76.9 mL/min (by C-G formula based on Cr of 1.1). ------------------------------------------------------------------------------------------------------------------ No results for input(s): TSH, T4TOTAL, T3FREE, THYROIDAB in the last 72 hours.  Invalid input(s): FREET3   Coagulation profile No results for input(s): INR, PROTIME in the last 168 hours. ------------------------------------------------------------------------------------------------------------------- No results for input(s): DDIMER in the last  72 hours. -------------------------------------------------------------------------------------------------------------------  Cardiac Enzymes  Recent Labs Lab 12/14/15 1942  TROPONINI 0.07*   ------------------------------------------------------------------------------------------------------------------ Invalid input(s): POCBNP  ---------------------------------------------------------------------------------------------------------------  Urinalysis    Component Value Date/Time   COLORURINE YELLOW 06/07/2010 0426   APPEARANCEUR CLEAR 06/07/2010 0426   LABSPEC 1.015 06/07/2010 0426   PHURINE 7.0 06/07/2010 0426   GLUCOSEU NEGATIVE 06/07/2010 0426   HGBUR SMALL* 06/07/2010 0426   BILIRUBINUR NEGATIVE 06/07/2010 0426   KETONESUR NEGATIVE 06/07/2010 0426   PROTEINUR 100* 06/07/2010 0426   UROBILINOGEN 0.2 06/07/2010 0426   NITRITE NEGATIVE 06/07/2010 0426   LEUKOCYTESUR NEGATIVE 06/07/2010 0426     RADIOLOGY: Dg Chest 2 View  12/14/2015  CLINICAL DATA:  Cough and congestion which shortness of breath beginning yesterday. EXAM: CHEST  2 VIEW COMPARISON:  12/01/2012 FINDINGS: Lungs are adequately inflated without focal consolidation. Minimal blunting of the right costophrenic angle compatible with a small amount of pleural fluid unchanged. Flattening of the hemidiaphragms. Cardiomediastinal silhouette is within normal. There is mild calcified plaque over the aortic arch. There are mild degenerative changes of the spine. IMPRESSION: Tiny amount of right pleural fluid unchanged. Electronically Signed   By: Elberta Fortis M.D.   On: 12/14/2015 21:07    EKG: Orders placed or performed during the hospital encounter of 12/14/15  . EKG 12-Lead  . EKG 12-Lead  . ED EKG within 10 minutes  . ED EKG within 10 minutes    IMPRESSION AND PLAN:  80 year old male patient with history of diastolic heart failure, hypertension, hyperlipidemia, coronary artery disease presented to the emergency  room with difficulty breathing for the last 3 days. Admitting diagnosis 1. Decompensated heart failure 2. Fluid overload 3. Acute on chronic diastolic heart failure 4. Hypertension 5. Hyperlipidemia 6. Coronary artery disease Treatment plan Admit patient to telemetry Diurese patient with IV Lasix 40 mg every 12 hourly and oral Aldactone Continue potassium supplementation  Low-salt diet Cycle troponin to rule out cardiac ischemia Check echocardiogram Resume home medications for coronary artery disease DVT prophylaxis with subcutaneous Lovenox 40 MG daily. Supportive care  All the records are reviewed and case discussed with ED provider. Management plans discussed with the patient, family and they are in agreement.  CODE STATUS:FULL Code Status History    This patient does not have a recorded code status. Please follow your organizational policy for patients in this situation.       TOTAL TIME TAKING CARE OF THIS PATIENT: 53 minutes.    Ihor Austin M.D on 12/15/2015 at 12:33 AM  Between 7am to 6pm - Pager - [phone]  After 6pm go to [email or web address] - password [email or web address] [facility]  [place] [facility]  Office  [phone]  CC: Primary care physician; No primary care provider on file.

## 2015-12-15 NOTE — Plan of Care (Signed)
Problem: Education: Goal: Ability to demonstrate managment of disease process will improve Outcome: Progressing Handouts provided to patients.

## 2015-12-15 NOTE — Progress Notes (Signed)
*  PRELIMINARY RESULTS* Echocardiogram 2D Echocardiogram has been performed.  Dylan HousekeeperJerry R [name] 12/15/2015, 11:15 AM

## 2015-12-15 NOTE — Progress Notes (Signed)
Patient complaining of some shortness of breath, lung sounds are slightly wheezy - pt requesting breathing treatment. Dr. Allena KatzPatel notified. MD to place orders.

## 2015-12-15 NOTE — Progress Notes (Signed)
Patient has rested quietly today. No pain. Refused bed alarm, but calls for assistance if family is not in the room. Family at bedside most of the day. Patient hoping to discharge tomorrow.

## 2015-12-15 NOTE — Progress Notes (Signed)
A&O. Admitted from home with swelling and shortness of breath. IV lasix given. Skin verified with Tiffany RN and [facility]. Robitussin given for cough. Educated on CHF. Patient of RA. No s/s distress.

## 2015-12-15 NOTE — Progress Notes (Signed)
Patient refuses bed alarm. Educated on safety. Agrees to call before getting up. 

## 2015-12-15 NOTE — Plan of Care (Signed)
Problem: Education: Goal: Knowledge of [facility] Education information/materials will improve Outcome: Progressing Handouts given to patient and verbally educated patient about CHF,and S&S of CHF.

## 2015-12-15 NOTE — Care Management Note (Signed)
Case Management Note  Patient Details  Name: KENO CARAWAY MRN: [id number] Date of Birth: 1936-07-01  Subjective/Objective:     CM consult for discharge planning.  Admitted with heart failure. On RA, No home O2. Lives at home with wife. Drives, ambulatory with a walker and a cane. Denies issues accessing medical care, medications or copays. PCP is [employer]. Cardiologist is Dr. Chancy Milroy. He has an appointment with Dr. Chancy Milroy on Monday. Patient states he should go home tomorrow per the attending. No needs anticipated.                Action/Plan: Home with self care.   Expected Discharge Date:                  Expected Discharge Plan:  Home/Self Care  In-House Referral:     Discharge [place] not met per provider, CM Consult  Post Acute Care Choice:    Choice offered to:     DME Arranged:    DME Agency:     HH Arranged:    HH Agency:     Status of Service:  Completed, signed off  Medicare Important Message Given:    Date Medicare IM Given:    Medicare IM give by:    Date Additional Medicare IM Given:    Additional Medicare Important Message give by:     If discussed at [place] of Stay Meetings, dates discussed:    Additional Comments:  Jolly Mango, RN 12/15/2015, 3:07 PM

## 2015-12-15 NOTE — Progress Notes (Signed)
[facility] - [place] at [facility]                                                                                                                                                                                            Patient Demographics   Dylan [name], is a 80 y.o. male, DOB - 01-19-1936, ZOX:[id number]  Admit date - 12/14/2015   Admitting Physician Ihor Austin, MD  Outpatient Primary MD for the patient is No primary care provider on file.   LOS - 0  Subjective: Patient continues to complain of cough swelling in the lower extremity improved. Denies any chest pains. Complains of intermittent wheezing     Review of Systems:   CONSTITUTIONAL: No documented fever. No fatigue, weakness. No weight gain, no weight loss.  EYES: No blurry or double vision.  ENT: No tinnitus. No postnasal drip. No redness of the oropharynx.  RESPIRATORY: Positive cough, positive wheeze, no hemoptysis. Positive dyspnea.  CARDIOVASCULAR: No chest pain. No orthopnea. No palpitations. No syncope.  GASTROINTESTINAL: No nausea, no vomiting or diarrhea. No abdominal pain. No melena or hematochezia.  GENITOURINARY: No dysuria or hematuria.  ENDOCRINE: No polyuria or nocturia. No heat or cold intolerance.  HEMATOLOGY: No anemia. No bruising. No bleeding.  INTEGUMENTARY: No rashes. No lesions.  MUSCULOSKELETAL: No arthritis. No swelling. No gout. Positive edema of the lower extremities NEUROLOGIC: No numbness, tingling, or ataxia. No seizure-type activity.  PSYCHIATRIC: No anxiety. No insomnia. No ADD.    Vitals:   Filed Vitals:   12/15/15 0147 12/15/15 0523 12/15/15 0923 12/15/15 1247  BP: 167/89 153/72 121/63 153/79  Pulse: 57 59 89 90  Temp: 98.2 F (36.8 C) 98.5 F (36.9 C)  98.5 F (36.9 C)  TempSrc: Oral Oral    Resp: Height:      Weight: 131.815 kg (290 lb 9.6 oz)     SpO2: 97% 93%  94%    Wt Readings from Last 3 Encounters:  12/15/15 131.815 kg (290 lb  9.6 oz)     Intake/Output Summary (Last 24 hours) at 12/15/15 1307 Last data filed at 12/15/15 0830  Gross per 24 hour  Intake    240 ml  Output    700 ml  Net   -460 ml    Physical Exam:   GENERAL: Pleasant-appearing in no apparent distress.  HEAD, EYES, EARS, NOSE AND THROAT: Atraumatic, normocephalic. Extraocular muscles are intact. Pupils equal and reactive to light. Sclerae anicteric. No conjunctival injection. No oro-pharyngeal erythema.  NECK: Supple. There is no jugular venous distention. No bruits, no lymphadenopathy, no thyromegaly.  HEART: Regular rate and rhythm,. No murmurs, no rubs, no clicks.  LUNGS: Occasional wheezing bilaterally no rhonchi has crackles at the bases ABDOMEN: Soft, flat, nontender, nondistended. Has good bowel sounds. No hepatosplenomegaly appreciated.  EXTREMITIES: No evidence of any cyanosis, clubbing, 2+ peripheral edema.  +2 pedal and radial pulses bilaterally.  NEUROLOGIC: The patient is alert, awake, and oriented x3 with no focal motor or sensory deficits appreciated bilaterally.  SKIN: Moist and warm with no rashes appreciated.  Psych: Not anxious, depressed LN: No inguinal LN enlargement    Antibiotics   Anti-infectives    Start     Dose/Rate Route Frequency Ordered Stop   12/15/15 1000  levofloxacin (LEVAQUIN) tablet 500 mg     500 mg Oral Daily 12/15/15 0846        Medications   Scheduled Meds: . aspirin EC  81 mg Oral Daily  . atorvastatin  40 mg Oral q1800  . doxazosin  8 mg Oral QHS  . enalapril  10 mg Oral Daily  . enoxaparin (LOVENOX) injection  40 mg Subcutaneous BID  . furosemide  40 mg Intravenous Q12H  . guaiFENesin  600 mg Oral BID  . hydrALAZINE  100 mg Oral BID  . levofloxacin  500 mg Oral Daily  . potassium chloride SA  20 mEq Oral BID  . predniSONE  50 mg Oral Q breakfast  . primidone  50 mg Oral QHS  . propranolol ER  120 mg Oral Daily  . sodium chloride flush  3 mL Intravenous Q12H  . spironolactone  25 mg  Oral Daily   Continuous Infusions:  PRN Meds:.sodium chloride, acetaminophen, guaiFENesin-dextromethorphan, ondansetron (ZOFRAN) IV, sodium chloride flush   Data Review:   Micro Results No results found for this or any previous visit (from the past 240 hour(s)).  Radiology Reports Dg Chest 2 View  12/14/2015  CLINICAL DATA:  Cough and congestion which shortness of breath beginning yesterday. EXAM: CHEST  2 VIEW COMPARISON:  12/01/2012 FINDINGS: Lungs are adequately inflated without focal consolidation. Minimal blunting of the right costophrenic angle compatible with a small amount of pleural fluid unchanged. Flattening of the hemidiaphragms. Cardiomediastinal silhouette is within normal. There is mild calcified plaque over the aortic arch. There are mild degenerative changes of the spine. IMPRESSION: Tiny amount of right pleural fluid unchanged. Electronically Signed   By: Elberta Fortisaniel  Boyle M.D.   On: 12/14/2015 21:07     CBC  Recent Labs Lab 12/14/15 1942 12/15/15 0256  WBC 7.0 6.4  HGB 13.8 14.6  HCT 41.1 43.2  PLT 111* 112*  MCV 93.9 93.7  MCH 31.6 31.7  MCHC 33.6 33.8  RDW 14.1 14.0    Chemistries   Recent Labs Lab 12/14/15 1942 12/15/15 0256  NA 136  --   K 4.0  --   CL 106  --   CO2 24  --   GLUCOSE 105*  --   BUN 24*  --   CREATININE 1.10 1.10  CALCIUM 8.7*  --    ------------------------------------------------------------------------------------------------------------------ estimated creatinine clearance is 76.6 mL/min (by C-G formula based on Cr of 1.1). ------------------------------------------------------------------------------------------------------------------ No results for input(s): HGBA1C in the last 72 hours. ------------------------------------------------------------------------------------------------------------------ No results for input(s): CHOL, HDL, LDLCALC, TRIG, CHOLHDL, LDLDIRECT in the last 72  hours. ------------------------------------------------------------------------------------------------------------------ No results for input(s): TSH, T4TOTAL, T3FREE, THYROIDAB in the last 72 hours.  Invalid input(s): FREET3 ------------------------------------------------------------------------------------------------------------------ No results for input(s): VITAMINB12, FOLATE, FERRITIN, TIBC, IRON, RETICCTPCT in the last 72 hours.  Coagulation profile No results  for input(s): INR, PROTIME in the last 168 hours.  No results for input(s): DDIMER in the last 72 hours.  Cardiac Enzymes  Recent Labs Lab 12/15/15 0005 12/15/15 0256 12/15/15 0735  TROPONINI 0.05* 0.05* 0.05*   ------------------------------------------------------------------------------------------------------------------ Invalid input(s): POCBNP    Assessment & Plan   80 year old male patient with history of diastolic heart failure, hypertension, hyperlipidemia, coronary artery disease presented to the emergency room with difficulty breathing and also coughing and wheezing for the past 3 days  1.  acute on chronic diastolic CHF will treat with IV Lasix and monitor renal function floor low ins and outs daily weights   2.cough with bronchospasm likely due to acute bronchitis at this time will place him on nebulizers. As well as oral steroids and oral antibiotics.  3. Coronary artery disease Slightly abnormal troponin likely due to demand ischemia on its her for any cardiac symptoms 4. Hypertension continue therapy with enalapril and Inderal 5. HyperlipidemiaContinue Lipitor  6. Coronary artery disease) continue aspirin and Inderal   7. Miscellaneous Lovenox for DVT prophylaxis      Code Status Orders        Start     Ordered   12/15/15 0134  Full code   Continuous     12/15/15 0134    Code Status History    Date Active Date Inactive Code Status Order ID Comments User Context   This patient has a  current code status but no historical code status.           Consults  none  DVT Prophylaxis  Lovenox    Lab Results  Component Value Date   PLT 112* 12/15/2015     Time Spent in minutes   Greater than 50% of time spent in care coordination and counseling patient regarding the condition and plan of care.   Auburn Bilberry M.D on 12/15/2015 at 1:07 PM  Between 7am to 6pm - Pager - [phone]  After 6pm go to [email or web address] - password [email or web address] [facility]  [facility] [place] [facility]   Office  [phone]

## 2015-12-15 NOTE — Progress Notes (Signed)
Per pt, son is going to bring in his home med list.

## 2015-12-16 LAB — BASIC METABOLIC PANEL
ANION GAP: 9 (ref 5–15)
BUN: 46 mg/dL — AB (ref 6–20)
CHLORIDE: 102 mmol/L (ref 101–111)
CO2: 24 mmol/L (ref 22–32)
Calcium: 8.1 mg/dL — ABNORMAL LOW (ref 8.9–10.3)
Creatinine, Ser: 1.24 mg/dL (ref 0.61–1.24)
GFR, EST NON AFRICAN AMERICAN: 54 mL/min — AB (ref >60)
GLUCOSE: 109 mg/dL — AB (ref 65–99)
POTASSIUM: 3.1 mmol/L — AB (ref 3.5–5.1)
SODIUM: 135 mmol/L (ref 135–145)

## 2015-12-16 LAB — ECHOCARDIOGRAM COMPLETE
Height: 71 in
WEIGHTICAEL: 4649.6 [oz_av]

## 2015-12-16 LAB — CBC
HEMATOCRIT: 39.8 % — AB (ref 40.0–52.0)
HEMOGLOBIN: 13.5 g/dL (ref 13.0–18.0)
MCH: 31.1 pg (ref 26.0–34.0)
MCHC: 34 g/dL (ref 32.0–36.0)
MCV: 91.5 fL (ref 80.0–100.0)
Platelets: 122 10*3/uL — ABNORMAL LOW (ref 150–440)
RBC: 4.35 MIL/uL — ABNORMAL LOW (ref 4.40–5.90)
RDW: 14 % (ref 11.5–14.5)
WBC: 7.5 10*3/uL (ref 3.8–10.6)

## 2015-12-16 MED ORDER — LEVOFLOXACIN 500 MG PO TABS: 500.0000 mg | ORAL_TABLET | Freq: Every day | ORAL | Status: DC

## 2015-12-16 MED ORDER — FUROSEMIDE 20 MG PO TABS: 20.0000 mg | ORAL_TABLET | Freq: Two times a day (BID) | ORAL | Status: DC

## 2015-12-16 MED ORDER — PREDNISONE 10 MG PO TABS: ORAL_TABLET | ORAL | Status: DC

## 2015-12-16 NOTE — Progress Notes (Signed)
Notified Dr. Tobi BastosPyreddy of heart rate going down into the 30's. Ordered to hold propranolol.

## 2015-12-16 NOTE — Discharge Summary (Signed)
[facility] [place] [facility]   PATIENT NAME: Dylan [name]    MR#:  [id number]  DATE OF BIRTH:  1935-09-03  DATE OF ADMISSION:  12/14/2015 ADMITTING PHYSICIAN: Ihor Austin, MD  DATE OF DISCHARGE: 12/16/2015 11:45 AM  PRIMARY CARE PHYSICIAN: No primary care provider on file.    ADMISSION DIAGNOSIS:  Shortness of breath [R06.02] Elevated troponin [R79.89]  DISCHARGE DIAGNOSIS:  Active Problems:   CHF (congestive heart failure) (HCC)   SECONDARY DIAGNOSIS:   Past Medical History  Diagnosis Date  . Osteoarthritis   . Tremor, essential   . Foot drop, right   . Cervicalgia   . Shoulder pain, left   . Hypertension   . Hyperlipidemia   . Diastolic heart failure (HCC)   . CAD (coronary artery disease)     HOSPITAL COURSE:   80 year old male with past medical history of coronary artery disease, diastolic CHF, hypertension, hyperlipidemia morbid obesity, osteoarthritis who presented to the hospital due to shortness of breath and noted to be in congestive heart failure.  1. Acute on chronic diastolic CHF-patient was admitted to the hospital and started on IV diuretics. He has responded well to it and clinically feels better. He is about 1.5 L negative since admission. -Patient was not on any diuretics prior to coming in the hospital and therefore is being short discharged on oral Lasix. -He will continue his propranolol and ACE inhibitor as stated below. He will follow-up with his cardiologist as an outpatient next week or so.  2. Acute bronchitis-while in the hospital patient was given some DuoNeb's, oral prednisone, and empiric Levaquin. -This has improved and now he is being discharged on oral prednisone taper and empiric Levaquin for a few more days.  3. Essential hypertension-patient remained hemodynamically stable while in the hospital -He will continue his propranolol, enalapril/HCTZ upon discharge.  4. Hyperlipidemia-patient resume his  simvastatin.  5. History of coronary disease-patient had no acute chest pain on the hospital. He will continue his aspirin, propranolol, statin.  DISCHARGE CONDITIONS:   Stable  CONSULTS OBTAINED:  Treatment Team:  Laurier Nancy, MD  DRUG ALLERGIES:  No Known Allergies  DISCHARGE MEDICATIONS:   Discharge Medication List as of 12/16/2015 11:05 AM    START taking these medications   Details  furosemide (LASIX) 20 MG tablet Take 1 tablet (20 mg total) by mouth 2 (two) times daily., Starting 12/16/2015, Until Discontinued, Print    levofloxacin (LEVAQUIN) 500 MG tablet Take 1 tablet (500 mg total) by mouth daily., Starting 12/16/2015, Until Discontinued, Print    predniSONE (DELTASONE) 10 MG tablet Label  & dispense according to the schedule below. 5 Pills PO for 1 day then, 4 Pills PO for 1 day, 3 Pills PO for 1 day, 2 Pills PO for 1 day, 1 Pill PO for 1 days then STOP., Print      CONTINUE these medications which have NOT CHANGED   Details  aspirin 81 MG tablet Take 81 mg by mouth daily., Until Discontinued, Historical Med    doxazosin (CARDURA) 8 MG tablet Take 1 tablet by mouth daily after supper. , Starting 11/16/2015, Until Discontinued, Historical Med    enalapril-hydrochlorothiazide (VASERETIC) 10-25 MG tablet Take 1 tablet by mouth daily after supper. , Starting 12/07/2015, Until Discontinued, Historical Med    hydrALAZINE (APRESOLINE) 100 MG tablet Take 1 tablet by mouth 2 (two) times daily., Starting 11/16/2015, Until Discontinued, Historical Med    multivitamin-iron-minerals-folic acid (CENTRUM) chewable tablet Chew 1 tablet by  mouth daily., Until Discontinued, Historical Med    potassium chloride SA (K-DUR,KLOR-CON) 20 MEQ tablet Take 1 tablet by mouth 2 (two) times daily., Starting 11/16/2015, Until Discontinued, Historical Med    primidone (MYSOLINE) 50 MG tablet Take 1 tablet by mouth 2 times daily at 12 noon and 4 pm. , Starting 11/16/2015, Until Discontinued,  Historical Med    propranolol ER (INDERAL LA) 120 MG 24 hr capsule Take 1 capsule by mouth daily., Starting 11/22/2015, Until Discontinued, Historical Med    simvastatin (ZOCOR) 80 MG tablet Take 1 tablet by mouth daily after supper. , Starting 11/22/2015, Until Discontinued, Historical Med    spironolactone (ALDACTONE) 25 MG tablet Take 1 tablet by mouth daily after supper. , Starting 12/07/2015, Until Discontinued, Historical Med         DISCHARGE INSTRUCTIONS:   DIET:  Cardiac diet  DISCHARGE CONDITION:  Stable  ACTIVITY:  Activity as tolerated  OXYGEN:  Home Oxygen: No.   Oxygen Delivery: room air  DISCHARGE LOCATION:  home   If you experience worsening of your admission symptoms, develop shortness of breath, life threatening emergency, suicidal or homicidal thoughts you must seek medical attention immediately by calling 911 or calling your MD immediately  if symptoms less severe.  You Must read complete instructions/literature along with all the possible adverse reactions/side effects for all the Medicines you take and that have been prescribed to you. Take any new Medicines after you have completely understood and accpet all the possible adverse reactions/side effects.   Please note  You were cared for by a hospitalist during your hospital stay. If you have any questions about your discharge medications or the care you received while you were in the hospital after you are discharged, you can call the unit and asked to speak with the hospitalist on call if the hospitalist that took care of you is not available. Once you are discharged, your primary care physician will handle any further medical issues. Please note that NO REFILLS for any discharge medications will be authorized once you are discharged, as it is imperative that you return to your primary care physician (or establish a relationship with a primary care physician if you do not have one) for your aftercare needs so  that they can reassess your need for medications and monitor your lab values.     Today   Shortness of breath much improved. Positive cough but nonproductive. Afebrile. No complaints presently. Wants to go home.  VITAL SIGNS:  Blood pressure 121/54, pulse 49, temperature 97.6 F (36.4 C), temperature source Oral, resp. rate 18, height 5\' 11"  (1.803 m), weight 130.636 kg (288 lb), SpO2 95 %.  I/O:   Intake/Output Summary (Last 24 hours) at 12/16/15 1347 Last data filed at 12/16/15 1027  Gross per 24 hour  Intake    600 ml  Output   1450 ml  Net   -850 ml    PHYSICAL EXAMINATION:  GENERAL:  80 y.o.-year-old obese patient sitting up in bed in no acute distress.  EYES: Pupils equal, round, reactive to light and accommodation. No scleral icterus. Extraocular muscles intact.  HEENT: Head atraumatic, normocephalic. Oropharynx and nasopharynx clear.  NECK:  Supple, no jugular venous distention. No thyroid enlargement, no tenderness.  LUNGS: Normal breath sounds bilaterally, no wheezing, rales,rhonchi. No use of accessory muscles of respiration.  CARDIOVASCULAR: S1, S2 normal. No murmurs, rubs, or gallops.  ABDOMEN: Soft, non-tender, non-distended. Bowel sounds present. No organomegaly or mass.  EXTREMITIES: +1 edema b/l,  No cyanosis, or clubbing.  NEUROLOGIC: Cranial nerves II through XII are intact. No focal motor or sensory defecits b/l.  PSYCHIATRIC: The patient is alert and oriented x 3. Good affect.  SKIN: No obvious rash, lesion, or ulcer.   DATA REVIEW:   CBC  Recent Labs Lab 12/16/15 0616  WBC 7.5  HGB 13.5  HCT 39.8*  PLT 122*    Chemistries   Recent Labs Lab 12/16/15 0616  NA 135  K 3.1*  CL 102  CO2 24  GLUCOSE 109*  BUN 46*  CREATININE 1.24  CALCIUM 8.1*    Cardiac Enzymes  Recent Labs Lab 12/15/15 1320  TROPONINI 0.06*    Microbiology Results  No results found for this or any previous visit.  RADIOLOGY:  Dg Chest 2 View  12/14/2015   CLINICAL DATA:  Cough and congestion which shortness of breath beginning yesterday. EXAM: CHEST  2 VIEW COMPARISON:  12/01/2012 FINDINGS: Lungs are adequately inflated without focal consolidation. Minimal blunting of the right costophrenic angle compatible with a small amount of pleural fluid unchanged. Flattening of the hemidiaphragms. Cardiomediastinal silhouette is within normal. There is mild calcified plaque over the aortic arch. There are mild degenerative changes of the spine. IMPRESSION: Tiny amount of right pleural fluid unchanged. Electronically Signed   By: Elberta Fortis M.D.   On: 12/14/2015 21:07      Management plans discussed with the patient, family and they are in agreement.  CODE STATUS:     Code Status Orders        Start     Ordered   12/15/15 0134  Full code   Continuous     12/15/15 0134    Code Status History    Date Active Date Inactive Code Status Order ID Comments User Context   This patient has a current code status but no historical code status.      TOTAL TIME TAKING CARE OF THIS PATIENT: 40 minutes.    Houston Siren M.D on 12/16/2015 at 1:47 PM  Between 7am to 6pm - Pager - [phone]  After 6pm go to [email or web address] - password [email or web address]  [facility]  Office  [phone]  CC: Primary care physician; No primary care provider on file.

## 2015-12-16 NOTE — Progress Notes (Signed)
Dylan [name] is a 80 y.o. male  [id number]  Primary Cardiologist: Adrian Blackwater Reason for Consultation: CHF  HPI: 80 year old white male with a past medical history of coronary artery disease congestive heart failure presented to the hospital with cough shortness of breath but no chest pain. The patient had mild pleural effusion and diuresed on Lasix and got better.   Review of Systems: Patient does have orthopnea PND but no chest pain   Past Medical History  Diagnosis Date  . Osteoarthritis   . Tremor, essential   . Foot drop, right   . Cervicalgia   . Shoulder pain, left   . Hypertension   . Hyperlipidemia   . Diastolic heart failure (HCC)   . CAD (coronary artery disease)     Medications Prior to Admission  Medication Sig Dispense Refill  . aspirin 81 MG tablet Take 81 mg by mouth daily.    Marland Kitchen doxazosin (CARDURA) 8 MG tablet Take 1 tablet by mouth daily after supper.     . enalapril-hydrochlorothiazide (VASERETIC) 10-25 MG tablet Take 1 tablet by mouth daily after supper.     . hydrALAZINE (APRESOLINE) 100 MG tablet Take 1 tablet by mouth 2 (two) times daily.    . multivitamin-iron-minerals-folic acid (CENTRUM) chewable tablet Chew 1 tablet by mouth daily.    . potassium chloride SA (K-DUR,KLOR-CON) 20 MEQ tablet Take 1 tablet by mouth 2 (two) times daily.    . primidone (MYSOLINE) 50 MG tablet Take 1 tablet by mouth 2 times daily at 12 noon and 4 pm.     . propranolol ER (INDERAL LA) 120 MG 24 hr capsule Take 1 capsule by mouth daily.    . simvastatin (ZOCOR) 80 MG tablet Take 1 tablet by mouth daily after supper.     Marland Kitchen spironolactone (ALDACTONE) 25 MG tablet Take 1 tablet by mouth daily after supper.        Marland Kitchen aspirin EC  81 mg Oral Daily  . atorvastatin  40 mg Oral q1800  . doxazosin  8 mg Oral QHS  . enalapril  10 mg Oral Daily  . enoxaparin (LOVENOX) injection  40 mg Subcutaneous BID  . furosemide  40 mg Intravenous Q12H  . guaiFENesin  600 mg Oral BID  .  hydrALAZINE  100 mg Oral BID  . ipratropium-albuterol  3 mL Nebulization Q6H  . levofloxacin  500 mg Oral Daily  . potassium chloride SA  20 mEq Oral BID  . predniSONE  50 mg Oral Q breakfast  . primidone  50 mg Oral QHS  . propranolol ER  120 mg Oral Daily  . sodium chloride flush  3 mL Intravenous Q12H  . spironolactone  25 mg Oral Daily    Infusions:    No Known Allergies  Social History   Social History  . Marital Status: Married    Spouse Name: N/A  . Number of Children: N/A  . Years of Education: N/A   Occupational History  . retired    Social History Main Topics  . Smoking status: Current Every Day Smoker -- 0.50 packs/day    Types: Cigarettes  . Smokeless tobacco: Not on file  . Alcohol Use: No  . Drug Use: No  . Sexual Activity: Not on file   Other Topics Concern  . Not on file   Social History Narrative    Family History  Problem Relation Age of Onset  . Diabetes Son     PHYSICAL EXAM: Filed Vitals:  12/15/15 2220 12/16/15 0520  BP:  121/54  Pulse: 70 49  Temp:  97.6 F (36.4 C)  Resp:  18     Intake/Output Summary (Last 24 hours) at 12/16/15 0958 Last data filed at 12/16/15 0754  Gross per 24 hour  Intake    240 ml  Output   1250 ml  Net  -1010 ml    General:  Well appearing. No respiratory difficulty HEENT: normal Neck: supple. no JVD. Carotids 2+ bilat; no bruits. No lymphadenopathy or thryomegaly appreciated. Cor: PMI nondisplaced. Regular rate & rhythm. No rubs, gallops or murmurs. Lungs: clear Abdomen: soft, nontender, nondistended. No hepatosplenomegaly. No bruits or masses. Good bowel sounds. Extremities: no cyanosis, clubbing, rash, edema Neuro: alert & oriented x 3, cranial nerves grossly intact. moves all 4 extremities w/o difficulty. Affect pleasant.  ZOX:WRUEAECG:Sinus bradycardia nonspecific ST-T changes  Results for orders placed or performed during the hospital encounter of 12/14/15 (from the past 24 hour(s))  Troponin I      Status: Abnormal   Collection Time: 12/15/15  1:20 PM  Result Value Ref Range   Troponin I 0.06 (H) <0.031 ng/mL  Basic metabolic panel     Status: Abnormal   Collection Time: 12/16/15  6:16 AM  Result Value Ref Range   Sodium 135 135 - 145 mmol/L   Potassium 3.1 (L) 3.5 - 5.1 mmol/L   Chloride 102 101 - 111 mmol/L   CO2 24 22 - 32 mmol/L   Glucose, Bld 109 (H) 65 - 99 mg/dL   BUN 46 (H) 6 - 20 mg/dL   Creatinine, Ser 5.401.24 0.61 - 1.24 mg/dL   Calcium 8.1 (L) 8.9 - 10.3 mg/dL   GFR calc non Af Amer 54 (L) >60 mL/min   GFR calc Af Amer >60 >60 mL/min   Anion gap 9 5 - 15  CBC     Status: Abnormal   Collection Time: 12/16/15  6:16 AM  Result Value Ref Range   WBC 7.5 3.8 - 10.6 K/uL   RBC 4.35 (L) 4.40 - 5.90 MIL/uL   Hemoglobin 13.5 13.0 - 18.0 g/dL   HCT 98.139.8 (L) 19.140.0 - 47.852.0 %   MCV 91.5 80.0 - 100.0 fL   MCH 31.1 26.0 - 34.0 pg   MCHC 34.0 32.0 - 36.0 g/dL   RDW 29.514.0 62.111.5 - 30.814.5 %   Platelets 122 (L) 150 - 440 K/uL   Dg Chest 2 View  12/14/2015  CLINICAL DATA:  Cough and congestion which shortness of breath beginning yesterday. EXAM: CHEST  2 VIEW COMPARISON:  12/01/2012 FINDINGS: Lungs are adequately inflated without focal consolidation. Minimal blunting of the right costophrenic angle compatible with a small amount of pleural fluid unchanged. Flattening of the hemidiaphragms. Cardiomediastinal silhouette is within normal. There is mild calcified plaque over the aortic arch. There are mild degenerative changes of the spine. IMPRESSION: Tiny amount of right pleural fluid unchanged. Electronically Signed   By: Elberta Fortisaniel  Boyle M.D.   On: 12/14/2015 21:07     ASSESSMENT AND PLAN: Scan congestive heart failure. Due to diastolic dysfunction with left ventricular ejection fraction normal on echocardiogram but mild diastolic dysfunction. Patient does not need to be on beta blocker but can be sent home on Lasix an KCL.Harlym Gehling A to the top or to the top

## 2016-02-08 DIAGNOSIS — I213 ST elevation (STEMI) myocardial infarction of unspecified site: Secondary | ICD-10-CM | POA: Insufficient documentation

## 2016-02-09 DIAGNOSIS — I2510 Atherosclerotic heart disease of native coronary artery without angina pectoris: Secondary | ICD-10-CM | POA: Insufficient documentation

## 2016-02-09 DIAGNOSIS — I441 Atrioventricular block, second degree: Secondary | ICD-10-CM | POA: Insufficient documentation

## 2016-04-22 ENCOUNTER — Other Ambulatory Visit: Payer: Self-pay | Admitting: Cardiovascular Disease

## 2016-04-30 ENCOUNTER — Encounter
Admission: RE | Disposition: A | Discharge status: Home / Self Care | Payer: Self-pay | Source: Ambulatory Visit | Attending: Cardiovascular Disease

## 2016-04-30 ENCOUNTER — Ambulatory Visit
Admission: RE | Admit: 2016-04-30 | Discharge: 2016-04-30 | Disposition: A | Discharge status: Home / Self Care | Payer: Medicare Other | Source: Ambulatory Visit | Attending: Cardiovascular Disease | Admitting: Cardiovascular Disease

## 2016-04-30 ENCOUNTER — Encounter: Payer: Self-pay | Admitting: *Deleted

## 2016-04-30 DIAGNOSIS — Z6841 Body Mass Index (BMI) 40.0 and over, adult: Secondary | ICD-10-CM | POA: Diagnosis not present

## 2016-04-30 DIAGNOSIS — F17200 Nicotine dependence, unspecified, uncomplicated: Secondary | ICD-10-CM | POA: Diagnosis not present

## 2016-04-30 DIAGNOSIS — R0600 Dyspnea, unspecified: Secondary | ICD-10-CM | POA: Insufficient documentation

## 2016-04-30 DIAGNOSIS — I509 Heart failure, unspecified: Secondary | ICD-10-CM | POA: Diagnosis not present

## 2016-04-30 DIAGNOSIS — I429 Cardiomyopathy, unspecified: Secondary | ICD-10-CM | POA: Diagnosis not present

## 2016-04-30 DIAGNOSIS — I252 Old myocardial infarction: Secondary | ICD-10-CM | POA: Diagnosis not present

## 2016-04-30 DIAGNOSIS — E669 Obesity, unspecified: Secondary | ICD-10-CM | POA: Insufficient documentation

## 2016-04-30 DIAGNOSIS — R0601 Orthopnea: Secondary | ICD-10-CM | POA: Diagnosis not present

## 2016-04-30 DIAGNOSIS — Z7901 Long term (current) use of anticoagulants: Secondary | ICD-10-CM | POA: Insufficient documentation

## 2016-04-30 DIAGNOSIS — Z7982 Long term (current) use of aspirin: Secondary | ICD-10-CM | POA: Diagnosis not present

## 2016-04-30 DIAGNOSIS — I110 Hypertensive heart disease with heart failure: Secondary | ICD-10-CM | POA: Diagnosis not present

## 2016-04-30 DIAGNOSIS — Z800 Family history of malignant neoplasm of digestive organs: Secondary | ICD-10-CM | POA: Insufficient documentation

## 2016-04-30 DIAGNOSIS — Z79899 Other long term (current) drug therapy: Secondary | ICD-10-CM | POA: Diagnosis not present

## 2016-04-30 DIAGNOSIS — R251 Tremor, unspecified: Secondary | ICD-10-CM | POA: Diagnosis not present

## 2016-04-30 DIAGNOSIS — R0602 Shortness of breath: Secondary | ICD-10-CM | POA: Diagnosis present

## 2016-04-30 DIAGNOSIS — E785 Hyperlipidemia, unspecified: Secondary | ICD-10-CM | POA: Diagnosis not present

## 2016-04-30 DIAGNOSIS — I2510 Atherosclerotic heart disease of native coronary artery without angina pectoris: Secondary | ICD-10-CM | POA: Insufficient documentation

## 2016-04-30 HISTORY — PX: CARDIAC CATHETERIZATION: SHX172

## 2016-04-30 HISTORY — DX: Heart failure, unspecified: I50.9

## 2016-04-30 SURGERY — LEFT HEART CATH AND CORONARY ANGIOGRAPHY
Anesthesia: Moderate Sedation | Laterality: Left

## 2016-04-30 MED ORDER — HEPARIN (PORCINE) IN NACL 2-0.9 UNIT/ML-% IJ SOLN
INTRAMUSCULAR | Status: AC
  Filled 2016-04-30: qty 1000

## 2016-04-30 MED ORDER — SODIUM CHLORIDE 0.9% FLUSH: 3.0000 mL | INTRAVENOUS | Status: DC | PRN

## 2016-04-30 MED ORDER — FENTANYL CITRATE (PF) 100 MCG/2ML IJ SOLN
INTRAMUSCULAR | Status: AC
  Filled 2016-04-30: qty 2

## 2016-04-30 MED ORDER — SODIUM CHLORIDE 0.9 % IV SOLN: INTRAVENOUS | Status: DC

## 2016-04-30 MED ORDER — MIDAZOLAM HCL 2 MG/2ML IJ SOLN
INTRAMUSCULAR | Status: DC | PRN
  Administered 2016-04-30: 1 mg via INTRAVENOUS

## 2016-04-30 MED ORDER — MIDAZOLAM HCL 2 MG/2ML IJ SOLN
INTRAMUSCULAR | Status: AC
  Filled 2016-04-30: qty 2

## 2016-04-30 MED ORDER — IOPAMIDOL (ISOVUE-300) INJECTION 61%
INTRAVENOUS | Status: DC | PRN
  Administered 2016-04-30: 130 mL via INTRA_ARTERIAL

## 2016-04-30 MED ORDER — ASPIRIN 81 MG PO CHEW: 81.0000 mg | CHEWABLE_TABLET | ORAL | Status: DC

## 2016-04-30 MED ORDER — FENTANYL CITRATE (PF) 100 MCG/2ML IJ SOLN
INTRAMUSCULAR | Status: DC | PRN
  Administered 2016-04-30 (×2): 25 ug via INTRAVENOUS

## 2016-04-30 MED ORDER — SODIUM CHLORIDE 0.9 % IV SOLN: 250.0000 mL | INTRAVENOUS | Status: DC | PRN

## 2016-04-30 MED ORDER — SODIUM CHLORIDE 0.9% FLUSH: 3.0000 mL | Freq: Two times a day (BID) | INTRAVENOUS | Status: DC

## 2016-04-30 SURGICAL SUPPLY — 10 items
CATH 5FR JL4 DIAGNOSTIC (CATHETERS) ×3 IMPLANT
CATH 5FR JR4 DIAGNOSTIC (CATHETERS) ×2 IMPLANT
CATH INFINITI 5FR ANG PIGTAIL (CATHETERS) ×3 IMPLANT
DEVICE CLOSURE MYNXGRIP 5F (Vascular Products) ×3 IMPLANT
GAUZE SPONGE 4X4 12PLY STRL (GAUZE/BANDAGES/DRESSINGS) ×3 IMPLANT
KIT MANI 3VAL PERCEP (MISCELLANEOUS) ×3 IMPLANT
NEEDLE PERC 18GX7CM (NEEDLE) ×3 IMPLANT
PACK CARDIAC CATH (CUSTOM PROCEDURE TRAY) ×3 IMPLANT
SHEATH PINNACLE 5F 10CM (SHEATH) ×3 IMPLANT
WIRE EMERALD 3MM-J .035X150CM (WIRE) ×3 IMPLANT

## 2016-04-30 NOTE — Discharge Instructions (Signed)

## 2016-05-24 DIAGNOSIS — J449 Chronic obstructive pulmonary disease, unspecified: Secondary | ICD-10-CM | POA: Insufficient documentation

## 2016-06-10 ENCOUNTER — Inpatient Hospital Stay
Admission: EM | Admit: 2016-06-10 | Discharge: 2016-06-13 | DRG: 292 | Disposition: A | Discharge status: Home Health | Payer: Medicare Other | Attending: Internal Medicine | Admitting: Internal Medicine

## 2016-06-10 ENCOUNTER — Emergency Department: Payer: Medicare Other

## 2016-06-10 DIAGNOSIS — R0602 Shortness of breath: Secondary | ICD-10-CM

## 2016-06-10 DIAGNOSIS — Z6841 Body Mass Index (BMI) 40.0 and over, adult: Secondary | ICD-10-CM

## 2016-06-10 DIAGNOSIS — R001 Bradycardia, unspecified: Secondary | ICD-10-CM | POA: Diagnosis present

## 2016-06-10 DIAGNOSIS — L0292 Furuncle, unspecified: Secondary | ICD-10-CM | POA: Diagnosis not present

## 2016-06-10 DIAGNOSIS — E876 Hypokalemia: Secondary | ICD-10-CM | POA: Diagnosis not present

## 2016-06-10 DIAGNOSIS — Z7982 Long term (current) use of aspirin: Secondary | ICD-10-CM | POA: Diagnosis not present

## 2016-06-10 DIAGNOSIS — Z8249 Family history of ischemic heart disease and other diseases of the circulatory system: Secondary | ICD-10-CM

## 2016-06-10 DIAGNOSIS — I890 Lymphedema, not elsewhere classified: Secondary | ICD-10-CM | POA: Diagnosis present

## 2016-06-10 DIAGNOSIS — Z23 Encounter for immunization: Secondary | ICD-10-CM | POA: Diagnosis present

## 2016-06-10 DIAGNOSIS — M6281 Muscle weakness (generalized): Secondary | ICD-10-CM

## 2016-06-10 DIAGNOSIS — M21371 Foot drop, right foot: Secondary | ICD-10-CM | POA: Diagnosis present

## 2016-06-10 DIAGNOSIS — M542 Cervicalgia: Secondary | ICD-10-CM | POA: Diagnosis not present

## 2016-06-10 DIAGNOSIS — E785 Hyperlipidemia, unspecified: Secondary | ICD-10-CM | POA: Diagnosis not present

## 2016-06-10 DIAGNOSIS — Z7902 Long term (current) use of antithrombotics/antiplatelets: Secondary | ICD-10-CM | POA: Diagnosis not present

## 2016-06-10 DIAGNOSIS — I5033 Acute on chronic diastolic (congestive) heart failure: Secondary | ICD-10-CM | POA: Diagnosis present

## 2016-06-10 DIAGNOSIS — R262 Difficulty in walking, not elsewhere classified: Secondary | ICD-10-CM

## 2016-06-10 DIAGNOSIS — M1990 Unspecified osteoarthritis, unspecified site: Secondary | ICD-10-CM | POA: Diagnosis present

## 2016-06-10 DIAGNOSIS — Z955 Presence of coronary angioplasty implant and graft: Secondary | ICD-10-CM | POA: Diagnosis not present

## 2016-06-10 DIAGNOSIS — I252 Old myocardial infarction: Secondary | ICD-10-CM

## 2016-06-10 DIAGNOSIS — I872 Venous insufficiency (chronic) (peripheral): Secondary | ICD-10-CM | POA: Diagnosis present

## 2016-06-10 DIAGNOSIS — I110 Hypertensive heart disease with heart failure: Principal | ICD-10-CM | POA: Diagnosis present

## 2016-06-10 DIAGNOSIS — I2510 Atherosclerotic heart disease of native coronary artery without angina pectoris: Secondary | ICD-10-CM | POA: Diagnosis present

## 2016-06-10 DIAGNOSIS — G4733 Obstructive sleep apnea (adult) (pediatric): Secondary | ICD-10-CM | POA: Diagnosis not present

## 2016-06-10 DIAGNOSIS — N179 Acute kidney failure, unspecified: Secondary | ICD-10-CM | POA: Diagnosis not present

## 2016-06-10 DIAGNOSIS — J449 Chronic obstructive pulmonary disease, unspecified: Secondary | ICD-10-CM | POA: Diagnosis present

## 2016-06-10 DIAGNOSIS — R609 Edema, unspecified: Secondary | ICD-10-CM

## 2016-06-10 LAB — COMPREHENSIVE METABOLIC PANEL
ALBUMIN: 3.4 g/dL — AB (ref 3.5–5.0)
ALT: 53 U/L (ref 17–63)
ANION GAP: 9 (ref 5–15)
AST: 50 U/L — ABNORMAL HIGH (ref 15–41)
Alkaline Phosphatase: 112 U/L (ref 38–126)
BUN: 30 mg/dL — ABNORMAL HIGH (ref 6–20)
CHLORIDE: 98 mmol/L — AB (ref 101–111)
CO2: 33 mmol/L — AB (ref 22–32)
Calcium: 9.2 mg/dL (ref 8.9–10.3)
Creatinine, Ser: 1.45 mg/dL — ABNORMAL HIGH (ref 0.61–1.24)
GFR calc non Af Amer: 45 mL/min — ABNORMAL LOW (ref >60)
GFR, EST AFRICAN AMERICAN: 52 mL/min — AB (ref >60)
Glucose, Bld: 150 mg/dL — ABNORMAL HIGH (ref 65–99)
Potassium: 3 mmol/L — ABNORMAL LOW (ref 3.5–5.1)
SODIUM: 140 mmol/L (ref 135–145)
Total Bilirubin: 0.5 mg/dL (ref 0.3–1.2)
Total Protein: 6.6 g/dL (ref 6.5–8.1)

## 2016-06-10 LAB — CBC WITH DIFFERENTIAL/PLATELET
BASOS ABS: 0.1 10*3/uL (ref 0–0.1)
Basophils Relative: 1 %
Eosinophils Absolute: 0.3 10*3/uL (ref 0–0.7)
Eosinophils Relative: 2 %
HEMATOCRIT: 38.1 % — AB (ref 40.0–52.0)
HEMOGLOBIN: 12.8 g/dL — AB (ref 13.0–18.0)
LYMPHS PCT: 15 %
Lymphs Abs: 1.8 10*3/uL (ref 1.0–3.6)
MCH: 30.3 pg (ref 26.0–34.0)
MCHC: 33.5 g/dL (ref 32.0–36.0)
MCV: 90.4 fL (ref 80.0–100.0)
MONO ABS: 0.9 10*3/uL (ref 0.2–1.0)
MONOS PCT: 8 %
NEUTROS ABS: 8.9 10*3/uL — AB (ref 1.4–6.5)
NEUTROS PCT: 74 %
Platelets: 192 10*3/uL (ref 150–440)
RBC: 4.22 MIL/uL — ABNORMAL LOW (ref 4.40–5.90)
RDW: 17.5 % — ABNORMAL HIGH (ref 11.5–14.5)
WBC: 11.9 10*3/uL — ABNORMAL HIGH (ref 3.8–10.6)

## 2016-06-10 LAB — URINALYSIS COMPLETE WITH MICROSCOPIC (ARMC ONLY)
BACTERIA UA: NONE SEEN
Bilirubin Urine: NEGATIVE
GLUCOSE, UA: NEGATIVE mg/dL
Hgb urine dipstick: NEGATIVE
Ketones, ur: NEGATIVE mg/dL
Leukocytes, UA: NEGATIVE
Nitrite: NEGATIVE
PROTEIN: NEGATIVE mg/dL
SPECIFIC GRAVITY, URINE: 1.004 — AB (ref 1.005–1.030)
Squamous Epithelial / LPF: NONE SEEN
WBC UA: NONE SEEN WBC/hpf (ref 0–5)
pH: 7 (ref 5.0–8.0)

## 2016-06-10 LAB — MAGNESIUM: MAGNESIUM: 1.6 mg/dL — AB (ref 1.7–2.4)

## 2016-06-10 LAB — BRAIN NATRIURETIC PEPTIDE: B Natriuretic Peptide: 627 pg/mL — ABNORMAL HIGH (ref 0.0–100.0)

## 2016-06-10 LAB — TROPONIN I: Troponin I: 0.1 ng/mL (ref <0.03)

## 2016-06-10 MED ORDER — ALBUTEROL SULFATE (2.5 MG/3ML) 0.083% IN NEBU: 2.5000 mg | INHALATION_SOLUTION | RESPIRATORY_TRACT | Status: DC | PRN

## 2016-06-10 MED ORDER — SODIUM CHLORIDE 0.9% FLUSH
3.0000 mL | Freq: Two times a day (BID) | INTRAVENOUS | Status: DC
  Administered 2016-06-10 – 2016-06-13 (×5): 3 mL via INTRAVENOUS

## 2016-06-10 MED ORDER — INFLUENZA VAC SPLIT QUAD 0.5 ML IM SUSY
0.5000 mL | PREFILLED_SYRINGE | INTRAMUSCULAR | Status: AC
  Administered 2016-06-11: 0.5 mL via INTRAMUSCULAR
  Filled 2016-06-10: qty 0.5

## 2016-06-10 MED ORDER — FUROSEMIDE 10 MG/ML IJ SOLN
60.0000 mg | Freq: Once | INTRAMUSCULAR | Status: AC
  Administered 2016-06-10: 60 mg via INTRAVENOUS
  Filled 2016-06-10: qty 8

## 2016-06-10 MED ORDER — SODIUM CHLORIDE 0.9 % IV SOLN: 250.0000 mL | INTRAVENOUS | Status: DC | PRN

## 2016-06-10 MED ORDER — SODIUM CHLORIDE 0.9% FLUSH
3.0000 mL | Freq: Two times a day (BID) | INTRAVENOUS | Status: DC
  Administered 2016-06-10 – 2016-06-12 (×4): 3 mL via INTRAVENOUS

## 2016-06-10 MED ORDER — ACETAMINOPHEN 650 MG RE SUPP: 650.0000 mg | Freq: Four times a day (QID) | RECTAL | Status: DC | PRN

## 2016-06-10 MED ORDER — BISACODYL 5 MG PO TBEC: 5.0000 mg | DELAYED_RELEASE_TABLET | Freq: Every day | ORAL | Status: DC | PRN

## 2016-06-10 MED ORDER — FUROSEMIDE 10 MG/ML IJ SOLN
40.0000 mg | Freq: Two times a day (BID) | INTRAMUSCULAR | Status: DC
  Administered 2016-06-10 – 2016-06-12 (×5): 40 mg via INTRAVENOUS
  Filled 2016-06-10 (×5): qty 4

## 2016-06-10 MED ORDER — SENNOSIDES-DOCUSATE SODIUM 8.6-50 MG PO TABS: 1.0000 | ORAL_TABLET | Freq: Every evening | ORAL | Status: DC | PRN

## 2016-06-10 MED ORDER — SODIUM CHLORIDE 0.9% FLUSH: 3.0000 mL | INTRAVENOUS | Status: DC | PRN

## 2016-06-10 MED ORDER — ONDANSETRON HCL 4 MG/2ML IJ SOLN: 4.0000 mg | Freq: Four times a day (QID) | INTRAMUSCULAR | Status: DC | PRN

## 2016-06-10 MED ORDER — HYDROCODONE-ACETAMINOPHEN 5-325 MG PO TABS: 1.0000 | ORAL_TABLET | ORAL | Status: DC | PRN

## 2016-06-10 MED ORDER — ONDANSETRON HCL 4 MG PO TABS
4.0000 mg | ORAL_TABLET | Freq: Four times a day (QID) | ORAL | Status: DC | PRN
Start: 2016-06-10 — End: 2016-06-13

## 2016-06-10 MED ORDER — ACETAMINOPHEN 325 MG PO TABS: 650.0000 mg | ORAL_TABLET | Freq: Four times a day (QID) | ORAL | Status: DC | PRN

## 2016-06-10 MED ORDER — HEPARIN SODIUM (PORCINE) 5000 UNIT/ML IJ SOLN
5000.0000 [IU] | Freq: Three times a day (TID) | INTRAMUSCULAR | Status: DC
  Filled 2016-06-10 (×4): qty 1

## 2016-06-10 NOTE — ED Notes (Signed)
Dr. Derrill KayGoodman notified of troponin 0.10.

## 2016-06-10 NOTE — H&P (Addendum)
[facility] [place] [facility]   PATIENT NAME: Dylan [name]    MR#:  [id number]  DATE OF BIRTH:  [birth year]  DATE OF ADMISSION:  06/10/2016  PRIMARY CARE PHYSICIAN: Jaclyn ShaggyATE,[name], MD   REQUESTING/REFERRING PHYSICIAN: Phineas SemenGraydon Goodman, MD  CHIEF COMPLAINT:   Chief Complaint  Patient presents with  . Leg Swelling  . Shortness of Breath   Shortness of breath and leg swelling for 1 week HISTORY OF PRESENT ILLNESS:  Dylan [name]  is a 80 y.o. male with a known history of CAD, chronic diastolic CHF, hypertension, hyperlipidemia and OSA. The patient has had worsening leg swelling and shortness of breath for the past one week. He also complains of orthopnea, nocturnal dyspnea, generalized weakness, increased body weight and leg skiing weeping. His cardiologist, Dr. Welton FlakesKhan, added additional diuretics without improvement. His chest x-ray show congestive heart failure.  PAST MEDICAL HISTORY:   Past Medical History:  Diagnosis Date  . CAD (coronary artery disease)   . Cervicalgia   . CHF (congestive heart failure) (HCC)   . Diastolic heart failure (HCC)   . Foot drop, right   . Hyperlipidemia   . Hypertension   . Osteoarthritis   . Shoulder pain, left   . Tremor, essential     PAST SURGICAL HISTORY:   Past Surgical History:  Procedure Laterality Date  . BACK SURGERY    . CARDIAC CATHETERIZATION Left 04/30/2016   Procedure: Left Heart Cath and Coronary Angiography;  Surgeon: Laurier NancyShaukat A Khan, MD;  Location: [facility] INVASIVE CV LAB;  Service: Cardiovascular;  Laterality: Left;  . CORONARY ANGIOPLASTY    . KNEE SURGERY      SOCIAL HISTORY:   Social History  Substance Use Topics  . Smoking status: Current Every Day Smoker    Packs/day: 0.25    Years: 58.00    Types: Cigarettes  . Smokeless tobacco: Never Used  . Alcohol use No    FAMILY HISTORY:   Family History  Problem Relation Age of Onset  . Diabetes Son   . Cancer Mother     DRUG ALLERGIES:  No  Known Allergies  REVIEW OF SYSTEMS:   Review of Systems  Constitutional: Positive for malaise/fatigue. Negative for chills and fever.       Increased body weight  Eyes: Negative for blurred vision and double vision.  Respiratory: Positive for cough and shortness of breath. Negative for hemoptysis, sputum production, wheezing and stridor.   Cardiovascular: Positive for orthopnea and leg swelling. Negative for chest pain and palpitations.       Nocturnal dyspnea  Gastrointestinal: Positive for constipation. Negative for abdominal pain, blood in stool, diarrhea, melena, nausea and vomiting.  Genitourinary: Positive for frequency. Negative for dysuria, hematuria and urgency.  Musculoskeletal: Negative for back pain.  Skin: Negative for itching and rash.  Neurological: Positive for weakness. Negative for dizziness, focal weakness, seizures, loss of consciousness and headaches.  Psychiatric/Behavioral: Negative for depression. The patient is not nervous/anxious.     MEDICATIONS AT HOME:   Prior to Admission medications   Medication Sig Start Date End Date Taking? Authorizing Provider  aspirin 81 MG tablet Take 81 mg by mouth daily.    Historical Provider, MD  atorvastatin (LIPITOR) 40 MG tablet Take 40 mg by mouth daily at 6 PM.    Historical Provider, MD  clopidogrel (PLAVIX) 75 MG tablet Take 75 mg by mouth daily.    Historical Provider, MD  docusate sodium (COLACE) 100 MG capsule Take 200  mg by mouth at bedtime.    Historical Provider, MD  doxazosin (CARDURA) 8 MG tablet Take 1 tablet by mouth daily after supper.  11/16/15   Historical Provider, MD  enalapril-hydrochlorothiazide (VASERETIC) 10-25 MG tablet Take 1 tablet by mouth daily after supper.  12/07/15   Historical Provider, MD  furosemide (LASIX) 20 MG tablet Take 1 tablet (20 mg total) by mouth 2 (two) times daily. Patient taking differently: Take 40 mg by mouth daily. 1 tab in pm 12/16/15   Houston Siren, MD  hydrALAZINE  (APRESOLINE) 100 MG tablet Take 1 tablet by mouth 2 (two) times daily. 11/16/15   Historical Provider, MD  levofloxacin (LEVAQUIN) 500 MG tablet Take 1 tablet (500 mg total) by mouth daily. 12/16/15   Houston Siren, MD  lisinopril (PRINIVIL,ZESTRIL) 5 MG tablet Take 5 mg by mouth daily.    Historical Provider, MD  multivitamin-iron-minerals-folic acid (CENTRUM) chewable tablet Chew 1 tablet by mouth daily.    Historical Provider, MD  potassium chloride SA (K-DUR,KLOR-CON) 20 MEQ tablet Take 1 tablet by mouth 2 (two) times daily. 11/16/15   Historical Provider, MD  predniSONE (DELTASONE) 10 MG tablet Label  & dispense according to the schedule below. 5 Pills PO for 1 day then, 4 Pills PO for 1 day, 3 Pills PO for 1 day, 2 Pills PO for 1 day, 1 Pill PO for 1 days then STOP. 12/16/15   Houston Siren, MD  primidone (MYSOLINE) 50 MG tablet Take 1 tablet by mouth 2 times daily at 12 noon and 4 pm. 3 tabs in am and 2 tabs in pm 11/16/15   Historical Provider, MD  propranolol ER (INDERAL LA) 120 MG 24 hr capsule Take 1 capsule by mouth daily. 11/22/15   Historical Provider, MD  simvastatin (ZOCOR) 80 MG tablet Take 1 tablet by mouth daily after supper.  11/22/15   Historical Provider, MD  spironolactone (ALDACTONE) 25 MG tablet Take 1 tablet by mouth daily after supper.  12/07/15   Historical Provider, MD      VITAL SIGNS:  Blood pressure (!) 137/55, pulse (!) 48, temperature 98.6 F (37 C), temperature source Oral, resp. rate 20, weight 290 lb (131.5 kg), SpO2 99 %.  PHYSICAL EXAMINATION:  Physical Exam  Constitutional: He is oriented to person, place, and time and well-developed, well-nourished, and in no distress.  Morbid obese.  HENT:  Mouth/Throat: Oropharynx is clear and moist.  Eyes: Conjunctivae and EOM are normal. Pupils are equal, round, and reactive to light. No scleral icterus.  Neck: Normal range of motion. Neck supple. No JVD present. No tracheal deviation present.  Cardiovascular: Normal  rate, regular rhythm and normal heart sounds.  Exam reveals no gallop.   No murmur heard. Pulmonary/Chest: Effort normal. No respiratory distress. He has no wheezes. He has rales.  Abdominal: Soft. Bowel sounds are normal. He exhibits no distension. There is no tenderness.  Musculoskeletal: Normal range of motion. He exhibits edema. He exhibits no tenderness.  Bilateral lower extremity, ankle and foot edema, 3+ with weeping  Lymphadenopathy:    He has no cervical adenopathy.  Neurological: He is alert and oriented to person, place, and time. No cranial nerve deficit.  Skin: No rash noted. No erythema.  Chronic skin changes on bilateral lower extremities.  Psychiatric: Affect normal.    LABORATORY PANEL:   CBC  Recent Labs Lab 06/10/16 1612  WBC 11.9*  HGB 12.8*  HCT 38.1*  PLT 192   ------------------------------------------------------------------------------------------------------------------  Chemistries  Recent Labs Lab 06/10/16 1612  NA 140  K 3.0*  CL 98*  CO2 33*  GLUCOSE 150*  BUN 30*  CREATININE 1.45*  CALCIUM 9.2  AST 50*  ALT 53  ALKPHOS 112  BILITOT 0.5   ------------------------------------------------------------------------------------------------------------------  Cardiac Enzymes  Recent Labs Lab 06/10/16 1612  TROPONINI 0.10*   ------------------------------------------------------------------------------------------------------------------  RADIOLOGY:  Dg Chest 2 View  Result Date: 06/10/2016 CLINICAL DATA:  Lower extremity edema and shortness of breath EXAM: CHEST  2 VIEW COMPARISON:  December 14, 2015. FINDINGS: There is patchy interstitial edema. There are bilateral pleural effusions. There is atelectatic change in the lung bases. There is cardiomegaly with pulmonary venous hypertension. There is atherosclerotic calcification in the aorta. No adenopathy. There are mild lesions evident. IMPRESSION: Evidence of congestive heart failure.   Aortic atherosclerosis. Electronically Signed   By: Bretta Bang III M.D.   On: 06/10/2016 17:18      IMPRESSION AND PLAN:   Acute on chronic diastolic CHF. The patient will be admitted to telemetry floor, continue telemetry monitor, start Lasix 40 mg IV twice a day, continue spironolactone, cardiology consult. He had a echocardiograph in cardiologist's office 4 days ago.  Hypertension. Continue home hypertension medication.  CAD, continue aspirin, Plavix and the statin.  OSA. CPAP at night. Morbid obesity. Tobacco abuse. Smoking cessation was counseled for 3 minutes.  All the records are reviewed and case discussed with ED provider. Management plans discussed with the patient, family and they are in agreement.  CODE STATUS: Full code  TOTAL TIME TAKING CARE OF THIS PATIENT: 57 minutes.    Shaune Pollack M.D on 06/10/2016 at 6:31 PM  Between 7am to 6pm - Pager - [phone]  After 6pm go to [email or web address] - Social research officer, government  [facility]  Office  [phone]  CC: Primary care physician; [name], MD   Note: This dictation was prepared with Dragon dictation along with smaller phrase technology. Any transcriptional errors that result from this process are unintentional.

## 2016-06-10 NOTE — ED Provider Notes (Signed)
[facility] Emergency Department Provider Note  ____________________________________________   I have reviewed the triage vital signs and the nursing notes.   HISTORY  Chief Complaint Leg Swelling and Shortness of Breath   History limited by: Not Limited   HPI Dylan [name] is a 80 y.o. male who presents to the emergency department today because of concerns for leg swelling and shortness breath. His legs have been getting more swollen over the past 5-6 days. Family is now concerned because they're weeping. Patient denies any pain in his legs. He also describes orthopnea stating that he now has to sit in a recliner. Patient denies any recent chest pain. Family states that his cardiologist has added on a second fluid pill.   Past Medical History:  Diagnosis Date  . CAD (coronary artery disease)   . Cervicalgia   . CHF (congestive heart failure) (HCC)   . Diastolic heart failure (HCC)   . Foot drop, right   . Hyperlipidemia   . Hypertension   . Osteoarthritis   . Shoulder pain, left   . Tremor, essential     Patient Active Problem List   Diagnosis Date Noted  . CHF (congestive heart failure) (HCC) 12/15/2015    Past Surgical History:  Procedure Laterality Date  . BACK SURGERY    . CARDIAC CATHETERIZATION Left 04/30/2016   Procedure: Left Heart Cath and Coronary Angiography;  Surgeon: Laurier NancyShaukat A Khan, MD;  Location: [facility] INVASIVE CV LAB;  Service: Cardiovascular;  Laterality: Left;  . CORONARY ANGIOPLASTY    . KNEE SURGERY      Prior to Admission medications   Medication Sig Start Date End Date Taking? Authorizing Provider  aspirin 81 MG tablet Take 81 mg by mouth daily.    Historical Provider, MD  atorvastatin (LIPITOR) 40 MG tablet Take 40 mg by mouth daily at 6 PM.    Historical Provider, MD  clopidogrel (PLAVIX) 75 MG tablet Take 75 mg by mouth daily.    Historical Provider, MD  docusate sodium (COLACE) 100 MG capsule Take 200 mg by mouth at  bedtime.    Historical Provider, MD  doxazosin (CARDURA) 8 MG tablet Take 1 tablet by mouth daily after supper.  11/16/15   Historical Provider, MD  enalapril-hydrochlorothiazide (VASERETIC) 10-25 MG tablet Take 1 tablet by mouth daily after supper.  12/07/15   Historical Provider, MD  furosemide (LASIX) 20 MG tablet Take 1 tablet (20 mg total) by mouth 2 (two) times daily. Patient taking differently: Take 40 mg by mouth daily. 1 tab in pm 12/16/15   [place] SirenVivek J Sainani, MD  hydrALAZINE (APRESOLINE) 100 MG tablet Take 1 tablet by mouth 2 (two) times daily. 11/16/15   Historical Provider, MD  levofloxacin (LEVAQUIN) 500 MG tablet Take 1 tablet (500 mg total) by mouth daily. 12/16/15   Houston SirenVivek J Sainani, MD  lisinopril (PRINIVIL,ZESTRIL) 5 MG tablet Take 5 mg by mouth daily.    Historical Provider, MD  multivitamin-iron-minerals-folic acid (CENTRUM) chewable tablet Chew 1 tablet by mouth daily.    Historical Provider, MD  potassium chloride SA (K-DUR,KLOR-CON) 20 MEQ tablet Take 1 tablet by mouth 2 (two) times daily. 11/16/15   Historical Provider, MD  predniSONE (DELTASONE) 10 MG tablet Label  & dispense according to the schedule below. 5 Pills PO for 1 day then, 4 Pills PO for 1 day, 3 Pills PO for 1 day, 2 Pills PO for 1 day, 1 Pill PO for 1 days then STOP. 12/16/15   Vivek  Jasper Loser, MD  primidone (MYSOLINE) 50 MG tablet Take 1 tablet by mouth 2 times daily at 12 noon and 4 pm. 3 tabs in am and 2 tabs in pm 11/16/15   Historical Provider, MD  propranolol ER (INDERAL LA) 120 MG 24 hr capsule Take 1 capsule by mouth daily. 11/22/15   Historical Provider, MD  simvastatin (ZOCOR) 80 MG tablet Take 1 tablet by mouth daily after supper.  11/22/15   Historical Provider, MD  spironolactone (ALDACTONE) 25 MG tablet Take 1 tablet by mouth daily after supper.  12/07/15   Historical Provider, MD    Allergies Review of patient's allergies indicates no known allergies.  Family History  Problem Relation Age of Onset  .  Diabetes Son     Social History Social History  Substance Use Topics  . Smoking status: Current Every Day Smoker    Packs/day: 0.25    Years: 58.00    Types: Cigarettes  . Smokeless tobacco: Never Used  . Alcohol use No    Review of Systems  Constitutional: Negative for fever. Cardiovascular: Negative for chest pain. Respiratory: Positive for shortness of breath. Gastrointestinal: Negative for abdominal pain, vomiting and diarrhea. Musculoskeletal: Negative for back pain. Bilateral leg swelling. Skin: Negative for rash. Neurological: Negative for headaches, focal weakness or numbness.  10-point ROS otherwise negative.  ____________________________________________   PHYSICAL EXAM:  VITAL SIGNS: ED Triage Vitals  Enc Vitals Group     BP 06/10/16 1609 (!) 170/69     Pulse Rate 06/10/16 1609 (!) 52     Resp 06/10/16 1609 (!) 22     Temp 06/10/16 1609 98.6 F (37 C)     Temp Source 06/10/16 1609 Oral     SpO2 06/10/16 1609 97 %     Weight 06/10/16 1610 290 lb (131.5 kg)     Height --      Head Circumference --      Peak Flow --      Pain Score 06/10/16 1610 0   Constitutional: Alert and oriented.  Eyes: Conjunctivae are normal. Normal extraocular movements. ENT   Head: Normocephalic and atraumatic.   Nose: No congestion/rhinnorhea.   Mouth/Throat: Mucous membranes are moist.   Neck: No stridor. Hematological/Lymphatic/Immunilogical: No cervical lymphadenopathy. Cardiovascular: Normal rate, regular rhythm.  No murmurs, rubs, or gallops. Respiratory: Mild increased respiratory effort.  Gastrointestinal: Soft and nontender. No distention.  Genitourinary: Deferred Musculoskeletal: Normal range of motion in all extremities. 3+ bilateral pitting edema with weeping noted.  Neurologic:  Normal speech and language. No gross focal neurologic deficits are appreciated.  Skin:  Skin is warm, dry and intact. No rash noted. Psychiatric: Mood and affect are  normal. Speech and behavior are normal. Patient exhibits appropriate insight and judgment.  ____________________________________________    LABS (pertinent positives/negatives)  Labs Reviewed  TROPONIN I - Abnormal; Notable for the following:       Result Value   Troponin I 0.10 (*)    All other components within normal limits  CBC WITH DIFFERENTIAL/PLATELET - Abnormal; Notable for the following:    WBC 11.9 (*)    RBC 4.22 (*)    Hemoglobin 12.8 (*)    HCT 38.1 (*)    RDW 17.5 (*)    Neutro Abs 8.9 (*)    All other components within normal limits  COMPREHENSIVE METABOLIC PANEL - Abnormal; Notable for the following:    Potassium 3.0 (*)    Chloride 98 (*)    CO2 33 (*)  Glucose, Bld 150 (*)    BUN 30 (*)    Creatinine, Ser 1.45 (*)    Albumin 3.4 (*)    AST 50 (*)    GFR calc non Af Amer 45 (*)    GFR calc Af Amer 52 (*)    All other components within normal limits  BRAIN NATRIURETIC PEPTIDE - Abnormal; Notable for the following:    B Natriuretic Peptide 627.0 (*)    All other components within normal limits     ____________________________________________   EKG  I, Phineas Semen, attending physician, personally viewed and interpreted this EKG  EKG Time: 1613 Rate: 52 Rhythm: sinus bradycardia with 1st degree av block Axis: left axis deviation Intervals: qtc 474 QRS: narrow, LVH ST changes: no st elevation Impression: abnormal ekg   ____________________________________________    RADIOLOGY  CXR IMPRESSION:  Evidence of congestive heart failure. Aortic atherosclerosis.    ____________________________________________   PROCEDURES  Procedures  ____________________________________________   INITIAL IMPRESSION / ASSESSMENT AND PLAN / ED COURSE  Pertinent labs & imaging results that were available during my care of the patient were reviewed by me and considered in my medical decision making (see chart for details).  Workup concerning for  CHF exacerbation. Will give IV lasix. Will admit to hospitalist. Think mildly elevated troponin likely secondary to CHF and not true ACS.  ____________________________________________   FINAL CLINICAL IMPRESSION(S) / ED DIAGNOSES  Final diagnoses:  Peripheral edema  Shortness of breath     Note: This dictation was prepared with Dragon dictation. Any transcriptional errors that result from this process are unintentional    Phineas Semen, MD 06/10/16 1759

## 2016-06-10 NOTE — ED Notes (Signed)
Pt has swelling of feet and lower legs for 2 weeks which is worse during last 2-3 days.  Pt also reports sob. cig smoker.  No fever.  Pt has drainage from right lower leg with redness to right foot.  No chest pain.  Pt alert.  Speech clear.  Pt states no n/v/d.  No abd pain.

## 2016-06-10 NOTE — Progress Notes (Signed)
Patient observed with wound on the right groin, per patient is boil wound.

## 2016-06-10 NOTE — Progress Notes (Signed)
Patient observed with rupture boil, dressing clean, dry and intact, bilateral lower extremities edema with some redness. Redness to abdominal fold, under breast, and groin areas.

## 2016-06-10 NOTE — ED Notes (Signed)
Report called to floor nurse nicki rn.  Pt alert.  Family with pt.

## 2016-06-10 NOTE — Progress Notes (Signed)
Patient received on 2A from Ed. Alert and oriented able to make needs known. Tele box  and skin verified with Paulino RilyAshley RN. Patient observed with surgical site on the right arm with steri strips, open area on the right groin with dry dressing. Patient on Ra, no distress noted. Will continues to monitor patient. Family at bedside with patient.

## 2016-06-10 NOTE — ED Triage Notes (Signed)
Pt to ER via POV c/o bilateral lower leg swelling, SOB with exertion. Pt unable to lie back and breathe comfortably. Pt advised to come to ER per Dr. Welton FlakesKhan today during appt.

## 2016-06-11 LAB — BASIC METABOLIC PANEL
Anion gap: 8 (ref 5–15)
BUN: 28 mg/dL — AB (ref 6–20)
CALCIUM: 9.2 mg/dL (ref 8.9–10.3)
CHLORIDE: 99 mmol/L — AB (ref 101–111)
CO2: 36 mmol/L — AB (ref 22–32)
CREATININE: 1.35 mg/dL — AB (ref 0.61–1.24)
GFR calc Af Amer: 56 mL/min — ABNORMAL LOW (ref >60)
GFR calc non Af Amer: 49 mL/min — ABNORMAL LOW (ref >60)
GLUCOSE: 107 mg/dL — AB (ref 65–99)
Potassium: 3.1 mmol/L — ABNORMAL LOW (ref 3.5–5.1)
Sodium: 143 mmol/L (ref 135–145)

## 2016-06-11 LAB — CBC
HCT: 37.3 % — ABNORMAL LOW (ref 40.0–52.0)
Hemoglobin: 12.6 g/dL — ABNORMAL LOW (ref 13.0–18.0)
MCH: 30.6 pg (ref 26.0–34.0)
MCHC: 33.9 g/dL (ref 32.0–36.0)
MCV: 90.1 fL (ref 80.0–100.0)
PLATELETS: 170 10*3/uL (ref 150–440)
RBC: 4.14 MIL/uL — ABNORMAL LOW (ref 4.40–5.90)
RDW: 17.1 % — AB (ref 11.5–14.5)
WBC: 10 10*3/uL (ref 3.8–10.6)

## 2016-06-11 MED ORDER — DOCUSATE SODIUM 100 MG PO CAPS
200.0000 mg | ORAL_CAPSULE | Freq: Every day | ORAL | Status: DC
  Administered 2016-06-11 – 2016-06-12 (×2): 200 mg via ORAL
  Filled 2016-06-11 (×2): qty 2

## 2016-06-11 MED ORDER — ATORVASTATIN CALCIUM 20 MG PO TABS
40.0000 mg | ORAL_TABLET | Freq: Every day | ORAL | Status: DC
  Administered 2016-06-11 – 2016-06-12 (×2): 40 mg via ORAL
  Filled 2016-06-11 (×2): qty 2

## 2016-06-11 MED ORDER — SPIRONOLACTONE 25 MG PO TABS
25.0000 mg | ORAL_TABLET | ORAL | Status: DC
  Administered 2016-06-11: 25 mg via ORAL
  Filled 2016-06-11: qty 1

## 2016-06-11 MED ORDER — UMECLIDINIUM-VILANTEROL 62.5-25 MCG/INH IN AEPB
1.0000 | INHALATION_SPRAY | Freq: Every day | RESPIRATORY_TRACT | Status: DC
  Administered 2016-06-11 – 2016-06-13 (×3): 1 via RESPIRATORY_TRACT
  Filled 2016-06-11: qty 14

## 2016-06-11 MED ORDER — LISINOPRIL 5 MG PO TABS
5.0000 mg | ORAL_TABLET | Freq: Every day | ORAL | Status: DC
  Administered 2016-06-11 – 2016-06-13 (×2): 5 mg via ORAL
  Filled 2016-06-11 (×3): qty 1

## 2016-06-11 MED ORDER — CLOPIDOGREL BISULFATE 75 MG PO TABS
75.0000 mg | ORAL_TABLET | Freq: Every day | ORAL | Status: DC
  Administered 2016-06-11 – 2016-06-13 (×3): 75 mg via ORAL
  Filled 2016-06-11 (×3): qty 1

## 2016-06-11 MED ORDER — HYDRALAZINE HCL 20 MG/ML IJ SOLN: 10.0000 mg | Freq: Four times a day (QID) | INTRAMUSCULAR | Status: DC | PRN

## 2016-06-11 MED ORDER — PRIMIDONE 50 MG PO TABS
150.0000 mg | ORAL_TABLET | Freq: Every day | ORAL | Status: DC
  Administered 2016-06-11 – 2016-06-13 (×3): 150 mg via ORAL
  Filled 2016-06-11 (×3): qty 3

## 2016-06-11 MED ORDER — PRIMIDONE 50 MG PO TABS
100.0000 mg | ORAL_TABLET | ORAL | Status: DC
  Administered 2016-06-11 – 2016-06-12 (×2): 100 mg via ORAL
  Filled 2016-06-11 (×2): qty 2

## 2016-06-11 MED ORDER — NYSTATIN 100000 UNIT/GM EX POWD
Freq: Two times a day (BID) | CUTANEOUS | Status: DC
  Administered 2016-06-11 (×2): via TOPICAL
  Administered 2016-06-12: 1 g via TOPICAL
  Administered 2016-06-12 – 2016-06-13 (×2): via TOPICAL
  Filled 2016-06-11: qty 15

## 2016-06-11 MED ORDER — PROPRANOLOL HCL ER 120 MG PO CP24
120.0000 mg | ORAL_CAPSULE | Freq: Every day | ORAL | Status: DC
  Filled 2016-06-11 (×2): qty 1

## 2016-06-11 MED ORDER — MAGNESIUM SULFATE 2 GM/50ML IV SOLN
2.0000 g | Freq: Once | INTRAVENOUS | Status: AC
  Administered 2016-06-11: 2 g via INTRAVENOUS
  Filled 2016-06-11: qty 50

## 2016-06-11 MED ORDER — HYDRALAZINE HCL 50 MG PO TABS
100.0000 mg | ORAL_TABLET | Freq: Two times a day (BID) | ORAL | Status: DC
  Administered 2016-06-11 (×2): 100 mg via ORAL
  Filled 2016-06-11 (×3): qty 2

## 2016-06-11 MED ORDER — PRIMIDONE 50 MG PO TABS: 50.0000 mg | ORAL_TABLET | Freq: Two times a day (BID) | ORAL | Status: DC

## 2016-06-11 MED ORDER — ASPIRIN EC 81 MG PO TBEC
81.0000 mg | DELAYED_RELEASE_TABLET | Freq: Every day | ORAL | Status: DC
  Administered 2016-06-11 – 2016-06-13 (×3): 81 mg via ORAL
  Filled 2016-06-11 (×3): qty 1

## 2016-06-11 MED ORDER — POTASSIUM CHLORIDE CRYS ER 20 MEQ PO TBCR
40.0000 meq | EXTENDED_RELEASE_TABLET | ORAL | Status: AC
  Administered 2016-06-11 (×2): 40 meq via ORAL
  Filled 2016-06-11 (×2): qty 2

## 2016-06-11 NOTE — Progress Notes (Signed)
Dylan [name] is a 80 y.o. male  [id number]  Primary Cardiologist: Adrian Blackwater Reason for Consultation: CHF  HPI: 12 YOWM with h/o CHF due diastolic dysfunction presented to office with swelling of legs discolaration and boils oozing and was sent to [facility].   Review of Systems: No chest pain, but has PND/Orthopnea along with leg swelling   Past Medical History:  Diagnosis Date  . CAD (coronary artery disease)   . Cervicalgia   . CHF (congestive heart failure) (HCC)   . Diastolic heart failure (HCC)   . Foot drop, right   . Hyperlipidemia   . Hypertension   . Osteoarthritis   . Shoulder pain, left   . Tremor, essential     Medications Prior to Admission  Medication Sig Dispense Refill  . albuterol (PROVENTIL HFA;VENTOLIN HFA) 108 (90 Base) MCG/ACT inhaler Inhale 2 puffs into the lungs every 6 (six) hours as needed.    Marland Kitchen aspirin 81 MG tablet Take 81 mg by mouth daily.    Marland Kitchen atorvastatin (LIPITOR) 40 MG tablet Take 40 mg by mouth daily at 6 PM.    . cephALEXin (KEFLEX) 500 MG capsule Take 500 mg by mouth 2 (two) times daily.    . clopidogrel (PLAVIX) 75 MG tablet Take 75 mg by mouth daily.    Marland Kitchen docusate sodium (COLACE) 100 MG capsule Take 200 mg by mouth at bedtime.    . furosemide (LASIX) 20 MG tablet Take 1 tablet (20 mg total) by mouth 2 (two) times daily. (Patient taking differently: Take 40 mg by mouth daily. 1 tab in pm) 60 tablet 1  . hydrALAZINE (APRESOLINE) 100 MG tablet Take 1 tablet by mouth 2 (two) times daily.    . hydrochlorothiazide (HYDRODIURIL) 25 MG tablet Take 25 mg by mouth daily.    Marland Kitchen lisinopril (PRINIVIL,ZESTRIL) 5 MG tablet Take 5 mg by mouth daily.    . multivitamin-iron-minerals-folic acid (CENTRUM) chewable tablet Chew 1 tablet by mouth daily.    . primidone (MYSOLINE) 50 MG tablet Take 1 tablet by mouth 2 times daily at 12 noon and 4 pm. 3 tabs in am and 2 tabs in pm    . propranolol ER (INDERAL LA) 120 MG 24 hr capsule Take 1 capsule by mouth daily.     Marland Kitchen spironolactone (ALDACTONE) 25 MG tablet Take 1 tablet by mouth daily after supper.     . umeclidinium-vilanterol (ANORO ELLIPTA) 62.5-25 MCG/INH AEPB Inhale 1 puff into the lungs daily.       Marland Kitchen aspirin EC  81 mg Oral Daily  . atorvastatin  40 mg Oral q1800  . clopidogrel  75 mg Oral Daily  . docusate sodium  200 mg Oral QHS  . furosemide  40 mg Intravenous Q12H  . heparin  5,000 Units Subcutaneous Q8H  . hydrALAZINE  100 mg Oral BID  . lisinopril  5 mg Oral Daily  . potassium chloride  40 mEq Oral Q4H  . primidone  100 mg Oral Daily  . primidone  150 mg Oral Q1200  . propranolol ER  120 mg Oral Daily  . sodium chloride flush  3 mL Intravenous Q12H  . sodium chloride flush  3 mL Intravenous Q12H  . spironolactone  25 mg Oral PC supper  . umeclidinium-vilanterol  1 puff Inhalation Daily    Infusions:    No Known Allergies  Social History   Social History  . Marital status: Married    Spouse name: N/A  . Number  of children: N/A  . Years of education: N/A   Occupational History  . retired    Social History Main Topics  . Smoking status: Current Every Day Smoker    Packs/day: 0.25    Years: 58.00    Types: Cigarettes  . Smokeless tobacco: Never Used  . Alcohol use No  . Drug use: No  . Sexual activity: Not on file   Other Topics Concern  . Not on file   Social History Narrative  . No narrative on file    Family History  Problem Relation Age of Onset  . Diabetes Son   . Cancer Mother     PHYSICAL EXAM: Vitals:   06/11/16 0439 06/11/16 0750  BP: (!) 137/57 (!) 134/55  Pulse: (!) 46 (!) 51  Resp: 14   Temp: 98.3 F (36.8 C) 98.4 F (36.9 C)     Intake/Output Summary (Last 24 hours) at 06/11/16 1007 Last data filed at 06/11/16 1000  Gross per 24 hour  Intake              250 ml  Output             3375 ml  Net            -3125 ml    General:  Well appearing. No respiratory difficulty HEENT: normal Neck: supple. no JVD. Carotids 2+ bilat;  no bruits. No lymphadenopathy or thryomegaly appreciated. Cor: PMI nondisplaced. Regular rate & rhythm. No rubs, gallops or murmurs. Lungs: clear Abdomen: soft, nontender, nondistended. No hepatosplenomegaly. No bruits or masses. Good bowel sounds. Extremities: no cyanosis, clubbing, rash, edema Neuro: alert & oriented x 3, cranial nerves grossly intact. moves all 4 extremities w/o difficulty. Affect pleasant.  ZOX:WRUEA bradycardia with non-specific st changes.  Results for orders placed or performed during the hospital encounter of 06/10/16 (from the past 24 hour(s))  Troponin I     Status: Abnormal   Collection Time: 06/10/16  4:12 PM  Result Value Ref Range   Troponin I 0.10 (HH) <0.03 ng/mL  CBC with Differential     Status: Abnormal   Collection Time: 06/10/16  4:12 PM  Result Value Ref Range   WBC 11.9 (H) 3.8 - 10.6 K/uL   RBC 4.22 (L) 4.40 - 5.90 MIL/uL   Hemoglobin 12.8 (L) 13.0 - 18.0 g/dL   HCT 54.0 (L) 98.1 - 19.1 %   MCV 90.4 80.0 - 100.0 fL   MCH 30.3 26.0 - 34.0 pg   MCHC 33.5 32.0 - 36.0 g/dL   RDW 47.8 (H) 29.5 - 62.1 %   Platelets 192 150 - 440 K/uL   Neutrophils Relative % 74 %   Neutro Abs 8.9 (H) 1.4 - 6.5 K/uL   Lymphocytes Relative 15 %   Lymphs Abs 1.8 1.0 - 3.6 K/uL   Monocytes Relative 8 %   Monocytes Absolute 0.9 0.2 - 1.0 K/uL   Eosinophils Relative 2 %   Eosinophils Absolute 0.3 0 - 0.7 K/uL   Basophils Relative 1 %   Basophils Absolute 0.1 0 - 0.1 K/uL  Comprehensive metabolic panel     Status: Abnormal   Collection Time: 06/10/16  4:12 PM  Result Value Ref Range   Sodium 140 135 - 145 mmol/L   Potassium 3.0 (L) 3.5 - 5.1 mmol/L   Chloride 98 (L) 101 - 111 mmol/L   CO2 33 (H) 22 - 32 mmol/L   Glucose, Bld 150 (H) 65 - 99 mg/dL  BUN 30 (H) 6 - 20 mg/dL   Creatinine, Ser 1.611.45 (H) 0.61 - 1.24 mg/dL   Calcium 9.2 8.9 - 09.610.3 mg/dL   Total Protein 6.6 6.5 - 8.1 g/dL   Albumin 3.4 (L) 3.5 - 5.0 g/dL   AST 50 (H) 15 - 41 U/L   ALT 53 17 - 63  U/L   Alkaline Phosphatase 112 38 - 126 U/L   Total Bilirubin 0.5 0.3 - 1.2 mg/dL   GFR calc non Af Amer 45 (L) >60 mL/min   GFR calc Af Amer 52 (L) >60 mL/min   Anion gap 9 5 - 15  Brain natriuretic peptide     Status: Abnormal   Collection Time: 06/10/16  4:13 PM  Result Value Ref Range   B Natriuretic Peptide 627.0 (H) 0.0 - 100.0 pg/mL  Urinalysis complete, with microscopic ([facility] only)     Status: Abnormal   Collection Time: 06/10/16  7:35 PM  Result Value Ref Range   Color, Urine COLORLESS (A) YELLOW   APPearance CLEAR (A) CLEAR   Glucose, UA NEGATIVE NEGATIVE mg/dL   Bilirubin Urine NEGATIVE NEGATIVE   Ketones, ur NEGATIVE NEGATIVE mg/dL   Specific Gravity, Urine 1.004 (L) 1.005 - 1.030   Hgb urine dipstick NEGATIVE NEGATIVE   pH 7.0 5.0 - 8.0   Protein, ur NEGATIVE NEGATIVE mg/dL   Nitrite NEGATIVE NEGATIVE   Leukocytes, UA NEGATIVE NEGATIVE   RBC / HPF 0-5 0 - 5 RBC/hpf   WBC, UA NONE SEEN 0 - 5 WBC/hpf   Bacteria, UA NONE SEEN NONE SEEN   Squamous Epithelial / LPF NONE SEEN NONE SEEN  Magnesium     Status: Abnormal   Collection Time: 06/10/16  8:39 PM  Result Value Ref Range   Magnesium 1.6 (L) 1.7 - 2.4 mg/dL  Basic metabolic panel     Status: Abnormal   Collection Time: 06/11/16  4:30 AM  Result Value Ref Range   Sodium 143 135 - 145 mmol/L   Potassium 3.1 (L) 3.5 - 5.1 mmol/L   Chloride 99 (L) 101 - 111 mmol/L   CO2 36 (H) 22 - 32 mmol/L   Glucose, Bld 107 (H) 65 - 99 mg/dL   BUN 28 (H) 6 - 20 mg/dL   Creatinine, Ser 0.451.35 (H) 0.61 - 1.24 mg/dL   Calcium 9.2 8.9 - 40.910.3 mg/dL   GFR calc non Af Amer 49 (L) >60 mL/min   GFR calc Af Amer 56 (L) >60 mL/min   Anion gap 8 5 - 15  CBC     Status: Abnormal   Collection Time: 06/11/16  4:30 AM  Result Value Ref Range   WBC 10.0 3.8 - 10.6 K/uL   RBC 4.14 (L) 4.40 - 5.90 MIL/uL   Hemoglobin 12.6 (L) 13.0 - 18.0 g/dL   HCT 81.137.3 (L) 91.440.0 - 78.252.0 %   MCV 90.1 80.0 - 100.0 fL   MCH 30.6 26.0 - 34.0 pg   MCHC 33.9  32.0 - 36.0 g/dL   RDW 95.617.1 (H) 21.311.5 - 08.614.5 %   Platelets 170 150 - 440 K/uL   Dg Chest 2 View  Result Date: 06/10/2016 CLINICAL DATA:  Lower extremity edema and shortness of breath EXAM: CHEST  2 VIEW COMPARISON:  December 14, 2015. FINDINGS: There is patchy interstitial edema. There are bilateral pleural effusions. There is atelectatic change in the lung bases. There is cardiomegaly with pulmonary venous hypertension. There is atherosclerotic calcification in the aorta. No adenopathy. There are  mild lesions evident. IMPRESSION: Evidence of congestive heart failure.  Aortic atherosclerosis. Electronically Signed   By: Bretta Bang III M.D.   On: 06/10/2016 17:18     ASSESSMENT AND PLAN:CHF due to diastolic dysfunction. H/o PCI with DES this year f/u with cath recently which showed distal RCA disease 60%, with ectasia and being treated medically. LVEF normal on echo last month with grade 1 diastolic dysfunction. Agree with diuresis but may need antibiotics and vascular consult for legs.  Ryan Ogborn A

## 2016-06-11 NOTE — Progress Notes (Signed)
Patient observed with heart rate in the low 50's. Patient attending made aware, new order to hold Propranolol ER med. Held as ordered. Patient stable, no acute distress noted at this time. Remains asymptomatic.

## 2016-06-11 NOTE — Consult Note (Signed)
WOC Nurse wound consult note Reason for Consult:Chronic venous insufficiency.  Nonintact blisters to right anterior lower leg.  Chronic skin changes to bilateral lower legs.  Edema from knees to toes.  Will begin compression today.  Boil to right abdominal pannus.  Has been I & D, wife is applying dry dressing daily.  Will pack with packing strip today.  Wound type: Chronic venous insufficiency Pressure Ulcer POA: N/A Measurement:Right abdominal pannus boil:  1 cm x 0.5 cm x 1 cm  Right lower legs with 3 0.5 cm blisters (ruptured) to anterior lower leg.  Feet are warm to touch and erythematous.  Will apply bilateral Unnas boots today Wound EAV:WUJWbed:pink and moist.  Boil with moderate drainage and erythema circumferentially.  Will pack with Iodoform packing strip.  Drainage (amount, consistency, odor) Moderate purulent to right abdominal pannus boil Weeping from bilateral lower legs, worse on the right.  Periwound:Dry, cracked skin to lower legs.  Dressing procedure/placement/frequency:Cleanse bialteral lower legs with soap and water and pat gently dry.  Apply zinc layer, secured with self adherent coban.  Change weekly.   Cleanse right abdominal boil with NS and pat gently dry.  Gently fill wound depth with Iodofrm packing strip.  Cover with dry dressing.  Change daily.  Will not follow at this time.  Please re-consult if needed.  Maple HudsonKaren Reign Bartnick RN BSN CWON Pager [phone]

## 2016-06-11 NOTE — Progress Notes (Signed)
[facility] - [place] at [facility]   PATIENT NAME: Dylan [name]    MR#:  [id number]  DATE OF BIRTH:  Oct 14, 1935  SUBJECTIVE:   Patient admitted due to lower extremity edema and shortness of breath and noted to be in congestive heart failure. Being diuresed with IV Lasix. Shortness of breath improved since yesterday.  REVIEW OF SYSTEMS:    Review of Systems  Constitutional: Negative for chills and fever.  HENT: Negative for congestion and tinnitus.   Eyes: Negative for blurred vision and double vision.  Respiratory: Positive for shortness of breath. Negative for cough and wheezing.   Cardiovascular: Positive for leg swelling. Negative for chest pain, orthopnea and PND.  Gastrointestinal: Negative for abdominal pain, diarrhea, nausea and vomiting.  Genitourinary: Negative for dysuria and hematuria.  Neurological: Negative for dizziness, sensory change and focal weakness.  All other systems reviewed and are negative.   Nutrition: Heart healthy Tolerating Diet: Yes Tolerating PT: Await Eval.    DRUG ALLERGIES:  No Known Allergies  VITALS:  Blood pressure (!) 130/50, pulse (!) 53, temperature 98 F (36.7 C), temperature source Oral, resp. rate 12, height 5\' 8"  (1.727 m), weight 125.8 kg (277 lb 6.4 oz), SpO2 95 %.  PHYSICAL EXAMINATION:   Physical Exam  GENERAL:  80 y.o.-year-old obese patient lying in the bed in no acute distress.  EYES: Pupils equal, round, reactive to light and accommodation. No scleral icterus. Extraocular muscles intact.  HEENT: Head atraumatic, normocephalic. Oropharynx and nasopharynx clear.  NECK:  Supple, + jugular venous distention. No thyroid enlargement, no tenderness.  LUNGS: Normal breath sounds bilaterally, no wheezing, rhonchi, bibasilar rales. No use of accessory muscles of respiration.  CARDIOVASCULAR: S1, S2 normal. No murmurs, rubs, or gallops.  ABDOMEN: Soft, nontender, nondistended. Bowel sounds present. No organomegaly  or mass.  EXTREMITIES: No cyanosis, clubbing, + 2 edema b/l, signs of chronic venous stasis b/l. NEUROLOGIC: Cranial nerves II through XII are intact. No focal Motor or sensory deficits b/l. Globally weak.   PSYCHIATRIC: The patient is alert and oriented x 3.  SKIN: No obvious rash, lesion, or ulcer.    LABORATORY PANEL:   CBC  Recent Labs Lab 06/11/16 0430  WBC 10.0  HGB 12.6*  HCT 37.3*  PLT 170   ------------------------------------------------------------------------------------------------------------------  Chemistries   Recent Labs Lab 06/10/16 1612 06/10/16 2039 06/11/16 0430  NA 140  --  143  K 3.0*  --  3.1*  CL 98*  --  99*  CO2 33*  --  36*  GLUCOSE 150*  --  107*  BUN 30*  --  28*  CREATININE 1.45*  --  1.35*  CALCIUM 9.2  --  9.2  MG  --  1.6*  --   AST 50*  --   --   ALT 53  --   --   ALKPHOS 112  --   --   BILITOT 0.5  --   --    ------------------------------------------------------------------------------------------------------------------  Cardiac Enzymes  Recent Labs Lab 06/10/16 1612  TROPONINI 0.10*   ------------------------------------------------------------------------------------------------------------------  RADIOLOGY:  Dg Chest 2 View  Result Date: 06/10/2016 CLINICAL DATA:  Lower extremity edema and shortness of breath EXAM: CHEST  2 VIEW COMPARISON:  December 14, 2015. FINDINGS: There is patchy interstitial edema. There are bilateral pleural effusions. There is atelectatic change in the lung bases. There is cardiomegaly with pulmonary venous hypertension. There is atherosclerotic calcification in the aorta. No adenopathy. There are mild lesions evident. IMPRESSION: Evidence of congestive  heart failure.  Aortic atherosclerosis. Electronically Signed   By: Bretta BangWilliam  Woodruff III M.D.   On: 06/10/2016 17:18     ASSESSMENT AND PLAN:   80 year old male with past medical history of congestive heart failure, hypertension,  hyperlipidemia, history of coronary disease who presented to the hospital due to shortness of breath and lower extremity edema noted to be in congestive heart failure.   1. Acute on chronic diastolic congestive heart failure-discussed the cause patient shortness of breath and worsening lower extremity edema. -Continue diuresis with IV Lasix, aldactone, follow I's and O's and daily weights. -Appreciate cardiology consult, continue lisinopril, propranolol on hold due to bradycardia.  2. Chronic Venous stasis - due to LE edema and poor mobility.  - will [facility].   3. HTN - cont. Lisinopril.   - hold Propranolol due to bradycardia  4. Hyperlipidemia - cont. Atorvastatin  5. COPD - no acute exacerbation.  - cont. Anoro Ellipta.     All the records are reviewed and case discussed with Care Management/Social Worker. Management plans discussed with the patient, family and they are in agreement.  CODE STATUS: Full Code  DVT Prophylaxis: Heparin SQ  TOTAL TIME TAKING CARE OF THIS PATIENT: 30 minutes.   POSSIBLE D/C IN 1-2 DAYS, DEPENDING ON CLINICAL CONDITION.   Houston SirenSAINANI,VIVEK J M.D on 06/11/2016 at 2:59 PM  Between 7am to 6pm - Pager - [phone]  After 6pm go to [email or web address] - Social research officer, governmentpassword EPAS [email or web address]  [employer]  [facility]  Office  [phone]  CC: Primary care physician; Jaclyn ShaggyATE,[name], MD

## 2016-06-11 NOTE — Care Management (Signed)
Patient admitted with congestive heart failure.  He has had increasing shortness of breath, inability to lie flat, swelling of lower extremities and skin weeping.  He is currently open to [facility] physical therapy for what sound like djd in his knee.  Patient discusses Dr Lenard ForthMundy says he needs surgery.  CM discussed with patient he would not be able to have any invasive surgery until his cardiac status improves.  [facility] and [facility] extremities are now wrapped with coban.  Patient also has an abscess  right abdomen .  he is able to perform his adls.  Wife works at [employer] [facility] [employer] [facility].  Has a walker.  Notified [facility] of admission.  Patient will require resumption of PT in addition to SN OT Aide at discharge if he is able to discharge directly home.  Will also need to be assessed for need of home 02.  This was discussed during progression.  Patient says He has qualified for a home cpap but it has not been delivered.  Ordered by Dr Milta DeitersKhan's office.  Contacted office to troubleshoot the cpap referral office made and had to leave a voicemail message.

## 2016-06-12 DIAGNOSIS — R600 Localized edema: Secondary | ICD-10-CM

## 2016-06-12 DIAGNOSIS — R0602 Shortness of breath: Secondary | ICD-10-CM

## 2016-06-12 DIAGNOSIS — I5032 Chronic diastolic (congestive) heart failure: Secondary | ICD-10-CM

## 2016-06-12 DIAGNOSIS — N189 Chronic kidney disease, unspecified: Secondary | ICD-10-CM

## 2016-06-12 LAB — BASIC METABOLIC PANEL
Anion gap: 10 (ref 5–15)
BUN: 33 mg/dL — AB (ref 6–20)
CHLORIDE: 95 mmol/L — AB (ref 101–111)
CO2: 33 mmol/L — ABNORMAL HIGH (ref 22–32)
CREATININE: 1.38 mg/dL — AB (ref 0.61–1.24)
Calcium: 8.8 mg/dL — ABNORMAL LOW (ref 8.9–10.3)
GFR calc Af Amer: 55 mL/min — ABNORMAL LOW (ref >60)
GFR calc non Af Amer: 47 mL/min — ABNORMAL LOW (ref >60)
GLUCOSE: 109 mg/dL — AB (ref 65–99)
POTASSIUM: 3.1 mmol/L — AB (ref 3.5–5.1)
SODIUM: 138 mmol/L (ref 135–145)

## 2016-06-12 LAB — MAGNESIUM: Magnesium: 1.7 mg/dL (ref 1.7–2.4)

## 2016-06-12 MED ORDER — POTASSIUM CHLORIDE CRYS ER 20 MEQ PO TBCR
40.0000 meq | EXTENDED_RELEASE_TABLET | Freq: Once | ORAL | Status: AC
  Administered 2016-06-12: 40 meq via ORAL
  Filled 2016-06-12: qty 2

## 2016-06-12 MED ORDER — POTASSIUM CHLORIDE CRYS ER 20 MEQ PO TBCR
20.0000 meq | EXTENDED_RELEASE_TABLET | Freq: Every day | ORAL | Status: DC
  Administered 2016-06-12 – 2016-06-13 (×2): 20 meq via ORAL
  Filled 2016-06-12 (×3): qty 1

## 2016-06-12 MED ORDER — MAGNESIUM SULFATE 2 GM/50ML IV SOLN
2.0000 g | Freq: Once | INTRAVENOUS | Status: AC
  Administered 2016-06-12: 2 g via INTRAVENOUS
  Filled 2016-06-12: qty 50

## 2016-06-12 MED ORDER — HYDRALAZINE HCL 50 MG PO TABS
50.0000 mg | ORAL_TABLET | Freq: Two times a day (BID) | ORAL | Status: DC
  Administered 2016-06-12 – 2016-06-13 (×2): 50 mg via ORAL
  Filled 2016-06-12 (×2): qty 1

## 2016-06-12 MED ORDER — SPIRONOLACTONE 25 MG PO TABS
25.0000 mg | ORAL_TABLET | ORAL | Status: DC
  Administered 2016-06-12: 25 mg via ORAL
  Filled 2016-06-12: qty 1

## 2016-06-12 NOTE — Progress Notes (Signed)
[facility] - [place] at [facility]   PATIENT NAME: Antionette CharJerry Primo    MR#:  [id number]  DATE OF BIRTH:  03-16-1936  SUBJECTIVE:   Better Shortness of breath, on O2 [place] 2L.  REVIEW OF SYSTEMS:    Review of Systems  Constitutional: Negative for chills and fever.  HENT: Negative for congestion and tinnitus.   Eyes: Negative for blurred vision and double vision.  Respiratory: Positive for shortness of breath. Negative for cough and wheezing.   Cardiovascular: Positive for leg swelling. Negative for chest pain, orthopnea and PND.  Gastrointestinal: Negative for abdominal pain, diarrhea, nausea and vomiting.  Genitourinary: Negative for dysuria and hematuria.  Neurological: Negative for dizziness, sensory change and focal weakness.  All other systems reviewed and are negative.   Nutrition: Heart healthy Tolerating Diet: Yes Tolerating PT: Await Eval.    DRUG ALLERGIES:  No Known Allergies  VITALS:  Blood pressure (!) 108/45, pulse (!) 49, temperature 98 F (36.7 C), temperature source Oral, resp. rate 20, height 5\' 8"  (1.727 m), weight 270 lb 11.2 oz (122.8 kg), SpO2 99 %.  PHYSICAL EXAMINATION:   Physical Exam  GENERAL:  80 y.o.-year-old obese patient lying in the bed in no acute distress.  EYES: Pupils equal, round, reactive to light and accommodation. No scleral icterus. Extraocular muscles intact.  HEENT: Head atraumatic, normocephalic. Oropharynx and nasopharynx clear.  NECK:  Supple, + jugular venous distention. No thyroid enlargement, no tenderness.  LUNGS: Normal breath sounds bilaterally, no wheezing, rhonchi, bibasilar rales. No use of accessory muscles of respiration.  CARDIOVASCULAR: S1, S2 normal. No murmurs, rubs, or gallops.  ABDOMEN: Soft, nontender, nondistended. Bowel sounds present. No organomegaly or mass.  EXTREMITIES: No cyanosis, clubbing,  chronic venous stasis on bilateral legs, in dressing, 2+ edema on both ankles and feet.    NEUROLOGIC: Cranial nerves II through XII are intact. No focal Motor or sensory deficits b/l. Globally weak.   PSYCHIATRIC: The patient is alert and oriented x 3.  SKIN: No obvious rash, lesion, or ulcer.    LABORATORY PANEL:   CBC  Recent Labs Lab 06/11/16 0430  WBC 10.0  HGB 12.6*  HCT 37.3*  PLT 170   ------------------------------------------------------------------------------------------------------------------  Chemistries   Recent Labs Lab 06/10/16 1612  06/12/16 0502  NA 140  < > 138  K 3.0*  < > 3.1*  CL 98*  < > 95*  CO2 33*  < > 33*  GLUCOSE 150*  < > 109*  BUN 30*  < > 33*  CREATININE 1.45*  < > 1.38*  CALCIUM 9.2  < > 8.8*  MG  --   < > 1.7  AST 50*  --   --   ALT 53  --   --   ALKPHOS 112  --   --   BILITOT 0.5  --   --   < > = values in this interval not displayed. ------------------------------------------------------------------------------------------------------------------  Cardiac Enzymes  Recent Labs Lab 06/10/16 1612  TROPONINI 0.10*   ------------------------------------------------------------------------------------------------------------------  RADIOLOGY:  Dg Chest 2 View  Result Date: 06/10/2016 CLINICAL DATA:  Lower extremity edema and shortness of breath EXAM: CHEST  2 VIEW COMPARISON:  December 14, 2015. FINDINGS: There is patchy interstitial edema. There are bilateral pleural effusions. There is atelectatic change in the lung bases. There is cardiomegaly with pulmonary venous hypertension. There is atherosclerotic calcification in the aorta. No adenopathy. There are mild lesions evident. IMPRESSION: Evidence of congestive heart failure.  Aortic atherosclerosis. Electronically Signed  By: Bretta Bang III M.D.   On: 06/10/2016 17:18     ASSESSMENT AND PLAN:   80 year old male with past medical history of congestive heart failure, hypertension, hyperlipidemia, history of coronary disease who presented to the hospital due  to shortness of breath and lower extremity edema noted to be in congestive heart failure.   1. Acute on chronic diastolic congestive heart failure-discussed the cause patient shortness of breath and worsening lower extremity edema. -Continue diuresis with IV Lasix, aldactone, follow I's and O's and daily weights. -Appreciate cardiology consult, continue lisinopril, propranolol on hold due to bradycardia.  2. Chronic Venous stasis - due to LE edema and poor mobility.  Wound care with UNNA boots. Vascular surgery consult per Dr. Welton Flakes.  3. HTN, BP is in low side, hold Lisinopril, hydralazine and lasix if low BP.   - hold Propranolol due to bradycardia  4. Hyperlipidemia - cont. Atorvastatin  5. COPD - no acute exacerbation.  - cont. Anoro Ellipta.    * ARF. Improving. F/u BMP.  * Hypokalemia. Given KCl. F/u K. * Hypomagnesemia. Given iv mag, f/u mag.  All the records are reviewed and case discussed with Care Management/Social Worker. Management plans discussed with the patient, family and they are in agreement.  CODE STATUS: Full Code  DVT Prophylaxis: Heparin SQ  TOTAL TIME TAKING CARE OF THIS PATIENT: 35 minutes.   POSSIBLE D/C IN 1-2 DAYS, DEPENDING ON CLINICAL CONDITION.   Shaune Pollack M.D on 06/12/2016 at 2:05 PM  Between 7am to 6pm - Pager - [phone]  After 6pm go to [email or web address] - Social research officer, government  [employer] [facility]  Office  [phone]  CC: Primary care physician; [name], MD

## 2016-06-12 NOTE — Care Management (Signed)
Spoke  with patient's wife. Found that [facility] is supplying patient's home cpap and patient will receive it today. Notified primary nurse that family to bring in unit and it will have to be cleared by  biomed before used.  If patient qualifies for home 02- referral should go to [facility].  Home health services to [facility].  Wife says that her husband is not very motivated to participate in his own plan of care

## 2016-06-12 NOTE — Progress Notes (Signed)
Heart Failure Clinic appointment on June 24, 2016 at 12:00pm with Dylan Kindredina Blima Jaimes, FNP. Please call [phone] to reschedule.

## 2016-06-12 NOTE — Consult Note (Signed)
[facility] VE[facility] Vascular Consult Note  MRN : [id number]  Dylan [name] is a 80 y.o. (1935-09-20) male who presents with chief complaint of  Chief Complaint  Patient presents with  . Leg Swelling  . Shortness of Breath  .  History of Present Illness: I am asked to see the patient by Dr. Romeo Apple for evaluation of his severe venous stasis.  The patient presented to [facility] approximately 2 days ago secondary to increasing shortness of breath and massive swelling of his lower extremities. At the time of admission is legs have become weeping. He notes that if he put on pants that they would be saturated with fluid within the hour. His son notes that he was sleeping upright in a chair with his feet in a dependent position for many days.  Patient feels that his difficulties began back in April when he experienced a severe episode of bronchitis subsequently in June he had a myocardial infarction and coronary angioplasty and stent placement was performed. Within the month he developed severe pneumonia. He states that as these multiple medical problems occurred his swelling became worse and worse.  Patient denies CHF. He denies DVT. He has not been wearing any compression.  Since his hospitalization he has been effectively diuresed. His sleep apnea is now being treated with CPAP. He states he is feeling much better and is now breathing quite easily with oxygen by nasal cannula.  Current Facility-Administered Medications  Medication Dose Route Frequency Provider Last Rate Last Dose  . 0.9 %  sodium chloride infusion  250 mL Intravenous PRN Shaune Pollack, MD      . acetaminophen (TYLENOL) tablet 650 mg  650 mg Oral Q6H PRN Shaune Pollack, MD       Or  . acetaminophen (TYLENOL) suppository 650 mg  650 mg Rectal Q6H PRN Shaune Pollack, MD      . albuterol (PROVENTIL) (2.5 MG/3ML) 0.083% nebulizer solution 2.5 mg  2.5 mg Nebulization Q2H PRN Shaune Pollack, MD      . aspirin EC tablet 81  mg  81 mg Oral Daily Shaune Pollack, MD   81 mg at 06/12/16 0932  . atorvastatin (LIPITOR) tablet 40 mg  40 mg Oral q1800 Shaune Pollack, MD   40 mg at 06/11/16 1751  . bisacodyl (DULCOLAX) EC tablet 5 mg  5 mg Oral Daily PRN Shaune Pollack, MD      . clopidogrel (PLAVIX) tablet 75 mg  75 mg Oral Daily Shaune Pollack, MD   75 mg at 06/12/16 0928  . docusate sodium (COLACE) capsule 200 mg  200 mg Oral QHS Shaune Pollack, MD   200 mg at 06/11/16 2123  . furosemide (LASIX) injection 40 mg  40 mg Intravenous Q12H Shaune Pollack, MD   40 mg at 06/12/16 1241  . heparin injection 5,000 Units  5,000 Units Subcutaneous Q8H Shaune Pollack, MD      . hydrALAZINE (APRESOLINE) injection 10 mg  10 mg Intravenous Q6H PRN Shaune Pollack, MD      . hydrALAZINE (APRESOLINE) tablet 50 mg  50 mg Oral BID Shaune Pollack, MD      . HYDROcodone-acetaminophen (NORCO/VICODIN) 5-325 MG per tablet 1-2 tablet  1-2 tablet Oral Q4H PRN Shaune Pollack, MD      . lisinopril (PRINIVIL,ZESTRIL) tablet 5 mg  5 mg Oral Daily Shaune Pollack, MD   5 mg at 06/11/16 4098  . nystatin (MYCOSTATIN/NYSTOP) topical powder   Topical BID Houston Siren, MD      .  ondansetron (ZOFRAN) tablet 4 mg  4 mg Oral Q6H PRN Shaune PollackQing Chen, MD       Or  . ondansetron [facility](ZOFRAN) injection 4 mg  4 mg Intravenous Q6H PRN Shaune PollackQing Chen, MD      . potassium chloride SA (K-DUR,KLOR-CON) CR tablet 20 mEq  20 mEq Oral Daily Shaune PollackQing Chen, MD   20 mEq at 06/12/16 1240  . primidone (MYSOLINE) tablet 100 mg  100 mg Oral Daily Houston SirenVivek J Sainani, MD   100 mg at 06/12/16 1623  . primidone (MYSOLINE) tablet 150 mg  150 mg Oral Q1200 Houston SirenVivek J Sainani, MD   150 mg at 06/12/16 1239  . senna-docusate (Senokot-S) tablet 1 tablet  1 tablet Oral QHS PRN Shaune PollackQing Chen, MD      . sodium chloride flush (NS) 0.9 % injection 3 mL  3 mL Intravenous Q12H Shaune PollackQing Chen, MD   3 mL at 06/12/16 0932  . sodium chloride flush (NS) 0.9 % injection 3 mL  3 mL Intravenous Q12H Shaune PollackQing Chen, MD   3 mL at 06/11/16 2120  . sodium chloride flush (NS) 0.9 % injection 3 mL  3  mL Intravenous PRN Shaune PollackQing Chen, MD      . spironolactone (ALDACTONE) tablet 25 mg  25 mg Oral PC supper Shaune PollackQing Chen, MD      . umeclidinium-vilanterol [facility](ANORO ELLIPTA) 62.5-25 MCG/INH 1 puff  1 puff Inhalation Daily Shaune PollackQing Chen, MD   1 puff at 06/12/16 [id number]    Past Medical History:  Diagnosis Date  . CAD (coronary artery disease)   . Cervicalgia   . CHF (congestive heart failure) (HCC)   . Diastolic heart failure (HCC)   . Foot drop, right   . Hyperlipidemia   . Hypertension   . Osteoarthritis   . Shoulder pain, left   . Tremor, essential     Past Surgical History:  Procedure Laterality Date  . BACK SURGERY    . CARDIAC CATHETERIZATION Left 04/30/2016   Procedure: Left Heart Cath and Coronary Angiography;  Surgeon: Laurier NancyShaukat A Khan, MD;  Location: [facility] INVASIVE CV LAB;  Service: Cardiovascular;  Laterality: Left;  . CORONARY ANGIOPLASTY    . KNEE SURGERY      Social History Social History  Substance Use Topics  . Smoking status: Current Every Day Smoker    Packs/day: 0.25    Years: 58.00    Types: Cigarettes  . Smokeless tobacco: Never Used  . Alcohol use No    Family History Family History  Problem Relation Age of Onset  . Diabetes Son   . Cancer Mother   No family history of bleeding clotting disorders, porphyria or autoimmune disease.  No Known Allergies   REVIEW OF SYSTEMS (Negative unless checked)  Constitutional: [] Weight loss  [] Fever  [] Chills Cardiac: [] Chest pain   [] Chest pressure   [] Palpitations   [x] Shortness of breath when laying flat   [] Shortness of breath at rest   [x] Shortness of breath with exertion. Vascular:  [] Pain in legs with walking   [] Pain in legs at rest   [] Pain in legs when laying flat   [] Claudication   [x] Pain in feet when walking  [] Pain in feet at rest  [] Pain in feet when laying flat   [] History of DVT   [] Phlebitis   [x] Swelling in legs   [] Varicose veins   [x] Non-healing ulcers Pulmonary:   [] Uses home oxygen   [] Productive cough    [] Hemoptysis   [] Wheeze  [x] COPD   [x] Asthma Neurologic:  [] Dizziness  []   Blackouts   [] Seizures   [] History of stroke   [] History of TIA  [] Aphasia   [] Temporary blindness   [] Dysphagia   [] Weakness or numbness in arms   [] Weakness or numbness in legs Musculoskeletal:  [x] Arthritis   [x] Joint swelling   [x] Joint pain   [] Low back pain Hematologic:  [] Easy bruising  [] Easy bleeding   [] Hypercoagulable state   [] Anemic  [] Hepatitis Gastrointestinal:  [] Blood in stool   [] Vomiting blood  [] Gastroesophageal reflux/heartburn   [] Difficulty swallowing. Genitourinary:  [] Chronic kidney disease   [] Difficult urination  [] Frequent urination  [] Burning with urination   [] Blood in urine Skin:  [] Rashes   [x] Ulcers   [x] Wounds Psychological:  [] History of anxiety   []  History of major depression.  Physical Examination  Vitals:   06/12/16 0421 06/12/16 0506 06/12/16 0815 06/12/16 1522  BP:  (!) 145/76 (!) 108/45 (!) 126/58  Pulse:  60 (!) 49 (!) 57  Resp:  15 20 18   Temp:  98.4 F (36.9 C) 98 F (36.7 C) 98.2 F (36.8 C)  TempSrc:  Oral Oral Oral  SpO2:  98% 99% 100%  Weight: 122.8 kg (270 lb 11.2 oz)     Height: 5\' 8"  (1.727 m)      Body mass index is 41.16 kg/m. Gen:  WD/WN, NAD Head: [place]/AT, No temporalis wasting. Prominent temp pulse not noted. Ear/Nose/Throat: Hearing grossly intact, nares w/o erythema or drainage, oropharynx w/o Erythema/Exudate Eyes: PERRLA, EOMI.  Neck: Supple, no nuchal rigidity.  No bruit or JVD.  Pulmonary:  Good air movement, clear to auscultation bilaterally.  Cardiac: RRR, normal S1, S2, no Murmurs, rubs or gallops. Vascular:   There is 3+ pitting edema, Unna boots are placed this morning they're clean and intact and did not remove them to inspect the wounds Vessel Right Left  Radial Palpable Palpable  Ulnar Palpable Palpable  Brachial Palpable Palpable  Carotid Palpable, without bruit Palpable, without bruit  Aorta Not palpable N/A  Femoral Palpable Palpable   Popliteal Not Palpable Not Palpable  PT Not Palpable Not Palpable  DP Not Palpable Not Palpable   Gastrointestinal: soft, non-tender/non-distended. No guarding/reflex. No masses, surgical incisions, or scars. Musculoskeletal: M/S 4/5 Lower extremities with 5 out of 5 strength upper extremities.  Extremities without ischemic changes.  No deformity or atrophy. No edema. Neurologic: CN 2-12 intact. Pain and light touch intact in extremities.  Symmetrical.  Speech is fluent. Motor exam as listed above. Psychiatric: Judgment intact, Mood & affect appropriate for pt's clinical situation. Dermatologic: No rashes.  No cellulitis or open wounds. Lymph : No Cervical, Axillary, or Inguinal lymphadenopathy.   CBC Lab Results  Component Value Date   WBC 10.0 06/11/2016   HGB 12.6 (L) 06/11/2016   HCT 37.3 (L) 06/11/2016   MCV 90.1 06/11/2016   PLT 170 06/11/2016    BMET    Component Value Date/Time   NA 138 06/12/2016 0502   NA 142 12/02/2012 0512   K 3.1 (L) 06/12/2016 0502   K 3.1 (L) 12/02/2012 0512   CL 95 (L) 06/12/2016 0502   CL 105 12/02/2012 0512   CO2 33 (H) 06/12/2016 0502   CO2 29 12/02/2012 0512   GLUCOSE 109 (H) 06/12/2016 0502   GLUCOSE 97 12/02/2012 0512   BUN 33 (H) 06/12/2016 0502   BUN 24 (H) 12/02/2012 0512   CREATININE 1.38 (H) 06/12/2016 0502   CREATININE 1.20 12/02/2012 0512   CALCIUM 8.8 (L) 06/12/2016 0502   CALCIUM 8.2 (L) 12/02/2012 1610  GFRNONAA 47 (L) 06/12/2016 0502   GFRNONAA 59 (L) 12/02/2012 0512   GFRAA 55 (L) 06/12/2016 0502   GFRAA >60 12/02/2012 0512   Estimated Creatinine Clearance: 56.3 mL/min (by C-G formula based on SCr of 1.38 mg/dL (H)).  COAG Lab Results  Component Value Date   INR 1.09 06/12/2010   INR 1.14 06/07/2010    Radiology Dg Chest 2 View  Result Date: 06/10/2016 CLINICAL DATA:  Lower extremity edema and shortness of breath EXAM: CHEST  2 VIEW COMPARISON:  December 14, 2015. FINDINGS: There is patchy interstitial edema.  There are bilateral pleural effusions. There is atelectatic change in the lung bases. There is cardiomegaly with pulmonary venous hypertension. There is atherosclerotic calcification in the aorta. No adenopathy. There are mild lesions evident. IMPRESSION: Evidence of congestive heart failure.  Aortic atherosclerosis. Electronically Signed   By: Bretta Bang III M.D.   On: 06/10/2016 17:18      Assessment/Plan 1. Chronic Venous stasis with ulcerations - due to LE edema and poor mobility in association with his CHF and sleep apnea/pulmonary dysfunction.  I agree with UNNA boots for now. I have reviewed lymphedema and the treatment plan with the patient compression will be the mainstay. He has a new recliner at home and so elevation was also discussed in detail. Furthermore, the possibility of adding a lymph pump was also reviewed with the patient. A total of 60 minutes of time was spent discussing the etiology of his venous disease as well as the treatment plan.  2. Acute on chronic diastolic congestive heart failure-Continue diuresis with IV Lasix, aldactone, follow I's and O's and daily weights continue lisinopril, propranolol on hold due to bradycardia.  3. HTN, BP is low, hold Lisinopril, hydralazine and lasix as needed.   The Propranolol was discontinued due to bradycardia  4. Hyperlipidemia - continue statin therapy with Atorvastatin  5. COPD/sleep apnea - he will continue his inhalers his CPAP as now been delivered he will begin using this.   6.  Acute renal failure-diuretics will be adjusted I's and O's will be monitored   Levora Dredge, MD  06/12/2016 5:04 PM    This note was created with Dragon medical transcription system.  Any error is purely unintentional

## 2016-06-12 NOTE — Progress Notes (Signed)
SUBJECTIVE: Patient is feeling much better less short of breath and got his CPAP   Vitals:   06/12/16 0421 06/12/16 0506 06/12/16 0815 06/12/16 1522  BP:  (!) 145/76 (!) 108/45 (!) 126/58  Pulse:  60 (!) 49 (!) 57  Resp:  Temp:  98.4 F (36.9 C) 98 F (36.7 C) 98.2 F (36.8 C)  TempSrc:  Oral Oral Oral  SpO2:  98% 99% 100%  Weight: 270 lb 11.2 oz (122.8 kg)     Height:  (1.727 m)       Intake/Output Summary (Last 24 hours) at 06/12/16 1825 Last data filed at 06/12/16 1555  Gross per 24 hour  Intake              840 ml  Output             2725 ml  Net            -1885 ml    LABS: Basic Metabolic Panel:  Recent Labs  16/10/96 2039 06/11/16 0430 06/12/16 0502  NA  --  143 138  K  --  3.1* 3.1*  CL  --  99* 95*  CO2  --  36* 33*  GLUCOSE  --  107* 109*  BUN  --  28* 33*  CREATININE  --  1.35* 1.38*  CALCIUM  --  9.2 8.8*  MG 1.6*  --  1.7   Liver Function Tests:  Recent Labs  06/10/16 1612  AST 50*  ALT 53  ALKPHOS 112  BILITOT 0.5  PROT 6.6  ALBUMIN 3.4*   No results for input(s): LIPASE, AMYLASE in the last 72 hours. CBC:  Recent Labs  06/10/16 1612 06/11/16 0430  WBC 11.9* 10.0  NEUTROABS 8.9*  --   HGB 12.8* 12.6*  HCT 38.1* 37.3*  MCV 90.4 90.1  PLT 192 170   Cardiac Enzymes:  Recent Labs  06/10/16 1612  TROPONINI 0.10*   BNP: Invalid input(s): POCBNP D-Dimer: No results for input(s): DDIMER in the last 72 hours. Hemoglobin A1C: No results for input(s): HGBA1C in the last 72 hours. Fasting Lipid Panel: No results for input(s): CHOL, HDL, LDLCALC, TRIG, CHOLHDL, LDLDIRECT in the last 72 hours. Thyroid Function Tests: No results for input(s): TSH, T4TOTAL, T3FREE, THYROIDAB in the last 72 hours.  Invalid input(s): FREET3 Anemia Panel: No results for input(s): VITAMINB12, FOLATE, FERRITIN, TIBC, IRON, RETICCTPCT in the last 72 hours.   PHYSICAL EXAM General: Well developed, well nourished, in no acute  distress HEENT:  Normocephalic and atramatic Neck:  No JVD.  Lungs: Clear bilaterally to auscultation and percussion. Heart: HRRR . Normal S1 and S2 without gallops or murmurs.  Abdomen: Bowel sounds are positive, abdomen soft and non-tender  Msk:  Back normal, normal gait. Normal strength and tone for age. Extremities: No clubbing, cyanosis or edema.   Neuro: Alert and oriented X 3. Psych:  Good affect, responds appropriately  TELEMETRY: Sinus rhythm  ASSESSMENT AND PLAN: Sleep apnea with right-sided failure and stasis of the lower extremity with blisters requiring vascular to see the patient. CPAP should be used which will help in improving patient's symptoms.  Active Problems:   Acute on chronic diastolic CHF (congestive heart failure) (HCC)    Adrian Blackwater A, MD, [facility] 06/12/2016 6:25 PM

## 2016-06-12 NOTE — Progress Notes (Signed)
Continue diuersis, and also advise getting beside wound care involved , get vascular to see patient.

## 2016-06-13 LAB — BASIC METABOLIC PANEL
Anion gap: 9 (ref 5–15)
BUN: 35 mg/dL — AB (ref 6–20)
CHLORIDE: 95 mmol/L — AB (ref 101–111)
CO2: 36 mmol/L — AB (ref 22–32)
Calcium: 9.1 mg/dL (ref 8.9–10.3)
Creatinine, Ser: 1.31 mg/dL — ABNORMAL HIGH (ref 0.61–1.24)
GFR calc Af Amer: 58 mL/min — ABNORMAL LOW (ref >60)
GFR calc non Af Amer: 50 mL/min — ABNORMAL LOW (ref >60)
Glucose, Bld: 109 mg/dL — ABNORMAL HIGH (ref 65–99)
POTASSIUM: 3.7 mmol/L (ref 3.5–5.1)
SODIUM: 140 mmol/L (ref 135–145)

## 2016-06-13 LAB — MAGNESIUM: MAGNESIUM: 2 mg/dL (ref 1.7–2.4)

## 2016-06-13 MED ORDER — ALUM & MAG HYDROXIDE-SIMETH 200-200-20 MG/5ML PO SUSP
15.0000 mL | ORAL | Status: DC | PRN
  Administered 2016-06-13: 15 mL via ORAL
  Filled 2016-06-13: qty 30

## 2016-06-13 MED ORDER — FUROSEMIDE 40 MG PO TABS
40.0000 mg | ORAL_TABLET | Freq: Every day | ORAL | Status: DC
  Administered 2016-06-13: 40 mg via ORAL
  Filled 2016-06-13: qty 1

## 2016-06-13 NOTE — Evaluation (Signed)
Physical Therapy Evaluation Patient Details Name: Dylan [name] MRN: [id number] DOB: [birth year] Today's Date: 06/13/2016   History of Present Illness  80 y.o. male with a history of CAD, chronic diastolic CHF, hypertension, hyperlipidemia and OSA. The patient has had worsening leg swelling and shortness of breath for the past week. He also complains of orthopnea, nocturnal dyspnea, generalized weakness, increased body weight and leg weeping.  Admitted for acute on chronic CHF.  Clinical Impression  Pt was on supplemental O2 initially, took off nasal cannula and he remained in the 90s t/o the session even with  increased therapeutic exercises apart from exam including prolonged ambulation/gait training and seated LE (LAQ, hip flexion marching, hip Ab/Ad) strengthening exercises.  Pt did well and should be able to go home, he would benefit from HHPT for issues related to this hospitalization as well as current L knee pain/weakness associated with arthritis.    Follow Up Recommendations Home health PT    Equipment Recommendations       Recommendations for Other Services       Precautions / Restrictions Precautions Precautions: Fall Restrictions Weight Bearing Restrictions: No      Mobility  Bed Mobility               General bed mobility comments: Pt in recliner on arrival, not tested, reports he needed light assist to get to sitting EOB from supine  Transfers Overall transfer level: Independent Equipment used: Rolling walker (2 wheeled)             General transfer comment: Pt was able to rise to standing w/o direct assist and overall showed good safety and confidence rising with walker  Ambulation/Gait Ambulation/Gait assistance: Supervision Ambulation Distance (Feet): 60 Feet Assistive device: Rolling walker (2 wheeled)     Gait velocity interpretation: <1.8 ft/sec, indicative of risk for recurrent falls General Gait Details: Pt with slow, deliberate cadence,  but was able to ambulate w/o LOBs or any direct assist.  He did have baseline R foot drop but was able to clear his toes and on room air his O2 sats remained >92% (generall 93-95%).  Stairs            Wheelchair Mobility    Modified Rankin (Stroke Patients Only)       Balance Overall balance assessment: Modified Independent                                           Pertinent Vitals/Pain Pain Assessment:  (reports groin pain from getting out of the 2 days ago)    Home Living Family/patient expects to be discharged to:: Private residence Living Arrangements: Spouse/significant other   Type of Home: House Home Access: Ramped entrance       Home Equipment: Environmental consultantWalker - 2 wheels      Prior Function Level of Independence: Independent with assistive device(s)         Comments: Pt reports that he can normally get out of the house with some regularity but recently has not been able to get out much     Hand Dominance        Extremity/Trunk Assessment   Upper Extremity Assessment: Generalized weakness (essentailly no shoulder elevation b/l, good tricep strength)           Lower Extremity Assessment: Overall WFL for tasks assessed;Generalized weakness (R functional foot drop, but able to  hold against mod resist)         Communication   Communication: No difficulties  Cognition Arousal/Alertness: Awake/alert Behavior During Therapy: WFL for tasks assessed/performed Overall Cognitive Status: Within Functional Limits for tasks assessed                      General Comments      Exercises     Assessment/Plan    PT Assessment Patient needs continued PT services  PT Problem List Decreased strength;Decreased balance;Decreased mobility;Decreased coordination;Decreased cognition;Decreased knowledge of use of DME;Decreased safety awareness;Decreased activity tolerance;Pain          PT Treatment Interventions DME instruction;Gait  training;Functional mobility training;Therapeutic activities;Therapeutic exercise;Balance training;Patient/family education    PT Goals (Current goals can be found in the Care Plan section)  Acute Rehab PT Goals Patient Stated Goal: go home PT Goal Formulation: With patient Time For Goal Achievement: 06/27/16 Potential to Achieve Goals: Good    Frequency Min 2X/week   Barriers to discharge        Co-evaluation               End of Session Equipment Utilized During Treatment: Gait belt Activity Tolerance: Patient tolerated treatment well Patient left: with bed alarm set;with call bell/phone within reach;with family/visitor present;with nursing/sitter in room Nurse Communication: Mobility status         Time: 1610-9604 PT Time Calculation (min) (ACUTE ONLY): 27 min   Charges:   PT Evaluation $PT Eval Low Complexity: 1 Procedure PT Treatments $Therapeutic Exercise: 8-22 mins   PT G Codes:        Malachi Pro, DPT 06/13/2016, 10:21 AM

## 2016-06-13 NOTE — Progress Notes (Signed)
SUBJECTIVE: Feeling much better   Vitals:   06/13/16 0003 06/13/16 0401 06/13/16 0406 06/13/16 0847  BP:  134/71  (!) 131/59  Pulse:  67  75  Resp:  18  18  Temp:  97.5 F (36.4 C)  98.1 F (36.7 C)  TempSrc:  Oral  Oral  SpO2: 95% 96%  97%  Weight:   269 lb 14.4 oz (122.4 kg)   Height:        Intake/Output Summary (Last 24 hours) at 06/13/16 0959 Last data filed at 06/13/16 [id number]  Gross per 24 hour  Intake             1080 ml  Output             3200 ml  Net            -2120 ml    LABS: Basic Metabolic Panel:  Recent Labs  96/12/5408/11/17 0502 06/13/16 0449  NA 138 140  K 3.1* 3.7  CL 95* 95*  CO2 33* 36*  GLUCOSE 109* 109*  BUN 33* 35*  CREATININE 1.38* 1.31*  CALCIUM 8.8* 9.1  MG 1.7 2.0   Liver Function Tests:  Recent Labs  06/10/16 1612  AST 50*  ALT 53  ALKPHOS 112  BILITOT 0.5  PROT 6.6  ALBUMIN 3.4*   No results for input(s): LIPASE, AMYLASE in the last 72 hours. CBC:  Recent Labs  06/10/16 1612 06/11/16 0430  WBC 11.9* 10.0  NEUTROABS 8.9*  --   HGB 12.8* 12.6*  HCT 38.1* 37.3*  MCV 90.4 90.1  PLT 192 170   Cardiac Enzymes:  Recent Labs  06/10/16 1612  TROPONINI 0.10*   BNP: Invalid input(s): POCBNP D-Dimer: No results for input(s): DDIMER in the last 72 hours. Hemoglobin A1C: No results for input(s): HGBA1C in the last 72 hours. Fasting Lipid Panel: No results for input(s): CHOL, HDL, LDLCALC, TRIG, CHOLHDL, LDLDIRECT in the last 72 hours. Thyroid Function Tests: No results for input(s): TSH, T4TOTAL, T3FREE, THYROIDAB in the last 72 hours.  Invalid input(s): FREET3 Anemia Panel: No results for input(s): VITAMINB12, FOLATE, FERRITIN, TIBC, IRON, RETICCTPCT in the last 72 hours.   PHYSICAL EXAM General: Well developed, well nourished, in no acute distress HEENT:  Normocephalic and atramatic Neck:  No JVD.  Lungs: Clear bilaterally to auscultation and percussion. Heart: HRRR . Normal S1 and S2 without gallops or murmurs.   Abdomen: Bowel sounds are positive, abdomen soft and non-tender  Msk:  Back normal, normal gait. Normal strength and tone for age. Extremities: No clubbing, cyanosis or edema.   Neuro: Alert and oriented X 3. Psych:  Good affect, responds appropriately  TELEMETRY:NSR  ASSESSMENT AND PLAN: CHF/Sleep apnea/venous stasis, may go home with f/u next Tuesday at 2 pm.  Active Problems:   Acute on chronic diastolic CHF (congestive heart failure) (HCC)    Adrian BlackwaterKHAN,Yovan Leeman A, MD, [facility] 06/13/2016 9:59 AM

## 2016-06-13 NOTE — Progress Notes (Signed)
Patient is discharge home in  A stable condition on room air, summary and f/u care given to both patient and wife , verbalized understanding. Left with wife

## 2016-06-13 NOTE — Discharge Instructions (Signed)
Heart Failure Clinic appointment on June 24, 2016 at 12:00pm with Clarisa Kindredina Hackney, FNP. Please call [phone] to reschedule.  Heart healthy diet. Activity as tolerated. CPAP at night. HHPT.

## 2016-06-13 NOTE — Care Management Note (Addendum)
Case Management Note  Patient Details  Name: Dylan [name] MRN: [id number] Date of Birth: December 01, 1935  Subjective/Objective:                  Met with patient and his wife to discuss discharge planning. He is open to [place] and would like to resume care with them. He is pending medical clearance for left knee surgery with Dr. Leanor Kail.  He has limited mobility due to knee pain. His PCP is Dr. Hall Busing of [facility]. He uses [facility] for medications. He has wheelchair, walker, rollator, and bedside commode at home. He has had sleep study per Dr. Bridgett Larsson and pending CPAP delivery to home. He has children and a sister that provide transportation at home. He lives with his wife and she works so he will be alone some of the time. He is not concerned with that. He is currently no requiring O2.   Action/Plan: Tharon Aquas RN with [facility] notified of patient discharge to home today. RN needed for disease management/diet instructions and wound care to bilateral lower extremities.  No further RNCM needs.   Expected Discharge Date:                  Expected Discharge Plan:     In-House Referral:     Discharge planning Services  CM Consult  Post Acute Care Choice:  Home Health Choice offered to:  Patient, Spouse  DME Arranged:    DME Agency:     HH Arranged:  RN, PT, Nurse's Aide, OT [place] Agency:  [place]  Status of Service:  Completed, signed off  If discussed at [place] of Stay Meetings, dates discussed:    Additional Comments:  Marshell Garfinkel, RN 06/13/2016, 9:47 AM

## 2016-06-13 NOTE — Progress Notes (Signed)
Pt placed on home CPAP unit. CPAP plugged into red outlet. Brand new machine, just delivered today to hospital, with no signs of damage. 2L O2 in line

## 2016-06-13 NOTE — Discharge Summary (Signed)
[facility] [place] [facility]   PATIENT NAME: Dylan [name]    MR#:  [id number]  DATE OF BIRTH:  [birth year]  DATE OF ADMISSION:  06/10/2016   ADMITTING PHYSICIAN: Shaune PollackQing Deverick Pruss, MD  DATE OF DISCHARGE: 06/13/2016 PRIMARY CARE PHYSICIAN: Jaclyn ShaggyATE,[name], MD   ADMISSION DIAGNOSIS:  Shortness of breath [R06.02] Peripheral edema [R60.9] DISCHARGE DIAGNOSIS:  Active Problems:   Acute on chronic diastolic CHF (congestive heart failure) (HCC) Chronic Venous stasis SECONDARY DIAGNOSIS:   Past Medical History:  Diagnosis Date  . CAD (coronary artery disease)   . Cervicalgia   . CHF (congestive heart failure) (HCC)   . Diastolic heart failure (HCC)   . Foot drop, right   . Hyperlipidemia   . Hypertension   . Osteoarthritis   . Shoulder pain, left   . Tremor, essential    HOSPITAL COURSE:   80 year old male with past medical history of congestive heart failure, hypertension, hyperlipidemia, history of coronary disease who presented to the hospital due to shortness of breath and lower extremity edema noted to be in congestive heart failure.   1. Acute on chronic diastolic congestive heart failure-discussed the cause patient shortness of breath and worsening lower extremity edema. He has been treated with IV Lasix, aldactone and lisinopril, propranolol on hold due to bradycardia.  2. Chronic Venous stasis - due to LE edema and poor mobility.  Wound care with UNNA boots. No intervention per Dr. Gilda CreaseSchnier, Vascular surgery consult.  3. HTN, BP was in low side, hold Lisinopril, hydralazine and lasix if low BP. - hold Propranolol due to bradycardia  4. Hyperlipidemia - cont. Atorvastatin  5. COPD - no acute exacerbation.  - cont. Anoro Ellipta.    * ARF. Improved.  * Hypokalemia. Improved with KCl. * Hypomagnesemia.  Improved with iv mag. Per Dr. Welton FlakesKhan, patient can be discharged to home today. DISCHARGE CONDITIONS:  Stable, discharge to home with HHPT  today. CONSULTS OBTAINED:  Treatment Team:  Laurier NancyShaukat A Khan, MD Renford DillsGregory G Schnier, MD DRUG ALLERGIES:  No Known Allergies DISCHARGE MEDICATIONS:     Medication List    STOP taking these medications   cephALEXin 500 MG capsule Commonly known as:  KEFLEX     TAKE these medications   albuterol 108 (90 Base) MCG/ACT inhaler Commonly known as:  PROVENTIL HFA;VENTOLIN HFA Inhale 2 puffs into the lungs every 6 (six) hours as needed.   aspirin 81 MG tablet Take 81 mg by mouth daily.   atorvastatin 40 MG tablet Commonly known as:  LIPITOR Take 40 mg by mouth daily at 6 PM.   clopidogrel 75 MG tablet Commonly known as:  PLAVIX Take 75 mg by mouth daily.   docusate sodium 100 MG capsule Commonly known as:  COLACE Take 200 mg by mouth at bedtime.   furosemide 20 MG tablet Commonly known as:  LASIX Take 1 tablet (20 mg total) by mouth 2 (two) times daily. What changed:  how much to take  when to take this  additional instructions   hydrALAZINE 100 MG tablet Commonly known as:  APRESOLINE Take 1 tablet by mouth 2 (two) times daily.   hydrochlorothiazide 25 MG tablet Commonly known as:  HYDRODIURIL Take 25 mg by mouth daily.   lisinopril 5 MG tablet Commonly known as:  PRINIVIL,ZESTRIL Take 5 mg by mouth daily.   multivitamin-iron-minerals-folic acid chewable tablet Chew 1 tablet by mouth daily.   primidone 50 MG tablet Commonly known as:  MYSOLINE Take 1 tablet  by mouth 2 times daily at 12 noon and 4 pm. 3 tabs in am and 2 tabs in pm   propranolol ER 120 MG 24 hr capsule Commonly known as:  INDERAL LA Take 1 capsule by mouth daily.   spironolactone 25 MG tablet Commonly known as:  ALDACTONE Take 1 tablet by mouth daily after supper.   umeclidinium-vilanterol 62.5-25 MCG/INH Aepb Commonly known as:  ANORO ELLIPTA Inhale 1 puff into the lungs daily.        DISCHARGE INSTRUCTIONS:   DIET:  Heart healthy diet. DISCHARGE CONDITION:   Stable. ACTIVITY:  As tolerated.  If you experience worsening of your admission symptoms, develop shortness of breath, life threatening emergency, suicidal or homicidal thoughts you must seek medical attention immediately by calling 911 or calling your MD immediately  if symptoms less severe.  You Must read complete instructions/literature along with all the possible adverse reactions/side effects for all the Medicines you take and that have been prescribed to you. Take any new Medicines after you have completely understood and accpet all the possible adverse reactions/side effects.   Please note  You were cared for by a hospitalist during your hospital stay. If you have any questions about your discharge medications or the care you received while you were in the hospital after you are discharged, you can call the unit and asked to speak with the hospitalist on call if the hospitalist that took care of you is not available. Once you are discharged, your primary care physician will handle any further medical issues. Please note that NO REFILLS for any discharge medications will be authorized once you are discharged, as it is imperative that you return to your primary care physician (or establish a relationship with a primary care physician if you do not have one) for your aftercare needs so that they can reassess your need for medications and monitor your lab values.    On the day of Discharge:  VITAL SIGNS:  Blood pressure (!) 131/59, pulse 75, temperature 98.1 F (36.7 C), temperature source Oral, resp. rate 18, height 5\' 8"  (1.727 m), weight 269 lb 14.4 oz (122.4 kg), SpO2 97 %. PHYSICAL EXAMINATION:  GENERAL:  80 y.o.-year-old patient lying in the bed with no acute distress.  EYES: Pupils equal, round, reactive to light and accommodation. No scleral icterus. Extraocular muscles intact.  HEENT: Head atraumatic, normocephalic. Oropharynx and nasopharynx clear.  NECK:  Supple, no jugular  venous distention. No thyroid enlargement, no tenderness.  LUNGS: Normal breath sounds bilaterally, no wheezing, rales,rhonchi or crepitation. No use of accessory muscles of respiration.  CARDIOVASCULAR: S1, S2 normal. No murmurs, rubs, or gallops.  ABDOMEN: Soft, non-tender, non-distended. Bowel sounds present. No organomegaly or mass.  EXTREMITIES: No pedal edema, cyanosis, or clubbing.  NEUROLOGIC: Cranial nerves II through XII are intact. Muscle strength 5/5 in all extremities. Sensation intact. Gait not checked.  PSYCHIATRIC: The patient is alert and oriented x 3.  SKIN: No obvious rash, lesion, or ulcer.  DATA REVIEW:   CBC  Recent Labs Lab 06/11/16 0430  WBC 10.0  HGB 12.6*  HCT 37.3*  PLT 170    Chemistries   Recent Labs Lab 06/10/16 1612  06/13/16 0449  NA 140  < > 140  K 3.0*  < > 3.7  CL 98*  < > 95*  CO2 33*  < > 36*  GLUCOSE 150*  < > 109*  BUN 30*  < > 35*  CREATININE 1.45*  < > 1.31*  CALCIUM 9.2  < > 9.1  MG  --   < > 2.0  AST 50*  --   --   ALT 53  --   --   ALKPHOS 112  --   --   BILITOT 0.5  --   --   < > = values in this interval not displayed.   Microbiology Results  No results found for this or any previous visit.  RADIOLOGY:  No results found.   Management plans discussed with the patient, family and they are in agreement.  CODE STATUS:     Code Status Orders        Start     Ordered   06/10/16 1935  Full code  Continuous     06/10/16 1934    Code Status History    Date Active Date Inactive Code Status Order ID Comments User Context   12/15/2015  1:34 AM 12/16/2015  3:07 PM Full Code [id number]  Ihor Austin, MD ED    Advance Directive Documentation   Flowsheet Row Most Recent Value  Type of Advance Directive  Living will, Healthcare Power of Attorney  Pre-existing out of facility DNR order (yellow form or pink MOST form)  No data  "MOST" Form in Place?  No data      TOTAL TIME TAKING CARE OF THIS PATIENT: 35 minutes.     Shaune Pollack M.D on 06/13/2016 at 11:40 AM  Between 7am to 6pm - Pager - [phone]  After 6pm go to [email or web address] - Social research officer, government  [facility] [place]  Office  [phone]  CC: Primary care physician; [name], MD   Note: This dictation was prepared with Dragon dictation along with smaller phrase technology. Any transcriptional errors that result from this process are unintentional.

## 2016-06-17 ENCOUNTER — Ambulatory Visit (INDEPENDENT_AMBULATORY_CARE_PROVIDER_SITE_OTHER): Payer: Medicare Other | Admitting: Vascular Surgery

## 2016-06-17 ENCOUNTER — Encounter (INDEPENDENT_AMBULATORY_CARE_PROVIDER_SITE_OTHER): Payer: Self-pay | Admitting: Vascular Surgery

## 2016-06-17 DIAGNOSIS — L97919 Non-pressure chronic ulcer of unspecified part of right lower leg with unspecified severity: Principal | ICD-10-CM

## 2016-06-17 DIAGNOSIS — I83019 Varicose veins of right lower extremity with ulcer of unspecified site: Secondary | ICD-10-CM

## 2016-06-17 DIAGNOSIS — I83009 Varicose veins of unspecified lower extremity with ulcer of unspecified site: Secondary | ICD-10-CM | POA: Insufficient documentation

## 2016-06-17 DIAGNOSIS — I872 Venous insufficiency (chronic) (peripheral): Secondary | ICD-10-CM | POA: Diagnosis not present

## 2016-06-17 DIAGNOSIS — I890 Lymphedema, not elsewhere classified: Secondary | ICD-10-CM | POA: Insufficient documentation

## 2016-06-17 DIAGNOSIS — L97909 Non-pressure chronic ulcer of unspecified part of unspecified lower leg with unspecified severity: Secondary | ICD-10-CM

## 2016-06-17 NOTE — Progress Notes (Signed)
MRN : [id number]  Dylan [name] is a 80 y.o. ([birth year]) male who presents with chief complaint of  Chief Complaint  Patient presents with  . New Patient (Initial Visit)  .  History of Present Illness: Patient is seen for evaluation of leg pain and swelling associated with new onset ulceration. The patient first noticed the swelling remotely. The swelling is associated with pain and discoloration. The pain and swelling worsens with prolonged dependency and improves with elevation. The pain is unrelated to activity.  The patient notes that in the morning the legs are better but the leg symptoms worsened throughout the course of the day. The patient has also noted a progressive worsening of the discoloration in the ankle and shin area.   The patient notes that an ulcer has developed acutely without specific trauma and since it occurred it has been very slow to heal.  There is a moderate amount of drainage associated with the open area.  The wound is also very painful.  The patient denies claudication symptoms or rest pain symptoms.  The patient denies DJD and LS spine disease.  The patient has not had any past angiography, interventions or vascular surgery.  Elevation makes the leg symptoms better, dependency makes them much worse. The patient denies any recent changes in medications.  The patient has not been wearing graduated compression.  The patient denies a history of DVT or PE. There is no prior history of phlebitis. There is no history of primary lymphedema.  No history of malignancies. No history of trauma or groin or pelvic surgery. There is no history of radiation treatment to the groin or pelvis       Current Outpatient Prescriptions  Medication Sig Dispense Refill  . albuterol (PROVENTIL HFA;VENTOLIN HFA) 108 (90 Base) MCG/ACT inhaler Inhale 2 puffs into the lungs every 6 (six) hours as needed.    Marland Kitchen. aspirin 81 MG tablet Take 81 mg by mouth daily.    Marland Kitchen. atorvastatin  (LIPITOR) 40 MG tablet Take 40 mg by mouth daily at 6 PM.    . clopidogrel (PLAVIX) 75 MG tablet Take 75 mg by mouth daily.    Marland Kitchen. docusate sodium (COLACE) 100 MG capsule Take 200 mg by mouth at bedtime.    . furosemide (LASIX) 20 MG tablet Take 1 tablet (20 mg total) by mouth 2 (two) times daily. (Patient taking differently: Take 40 mg by mouth daily. 1 tab in pm) 60 tablet 1  . hydrALAZINE (APRESOLINE) 100 MG tablet Take 1 tablet by mouth 2 (two) times daily.    . hydrochlorothiazide (HYDRODIURIL) 25 MG tablet Take 25 mg by mouth daily.    . isosorbide mononitrate (IMDUR) 30 MG 24 hr tablet     . lisinopril (PRINIVIL,ZESTRIL) 5 MG tablet Take 5 mg by mouth daily.    . multivitamin-iron-minerals-folic acid (CENTRUM) chewable tablet Chew 1 tablet by mouth daily.    . nitroGLYCERIN (NITROSTAT) 0.3 MG SL tablet Place under the tongue.    . primidone (MYSOLINE) 50 MG tablet Take 1 tablet by mouth 2 times daily at 12 noon and 4 pm. 3 tabs in am and 2 tabs in pm    . propranolol ER (INDERAL LA) 120 MG 24 hr capsule Take 1 capsule by mouth daily.    Marland Kitchen. spironolactone (ALDACTONE) 25 MG tablet Take 1 tablet by mouth daily after supper.     . umeclidinium-vilanterol (ANORO ELLIPTA) 62.5-25 MCG/INH AEPB Inhale 1 puff into the lungs daily.  No current facility-administered medications for this visit.     Past Medical History:  Diagnosis Date  . CAD (coronary artery disease)   . Cervicalgia   . CHF (congestive heart failure) (HCC)   . Diastolic heart failure (HCC)   . Foot drop, right   . Hyperlipidemia   . Hypertension   . Myocardial infarction   . Osteoarthritis   . Shoulder pain, left   . Tremor, essential     Past Surgical History:  Procedure Laterality Date  . BACK SURGERY    . CARDIAC CATHETERIZATION Left 04/30/2016   Procedure: Left Heart Cath and Coronary Angiography;  Surgeon: Laurier Nancy, MD;  Location: [facility] INVASIVE CV LAB;  Service: Cardiovascular;  Laterality: Left;  .  CORONARY ANGIOPLASTY    . KNEE SURGERY      Social History Social History  Substance Use Topics  . Smoking status: Former Smoker    Packs/day: 0.25    Years: 58.00    Types: Cigarettes  . Smokeless tobacco: Never Used  . Alcohol use No     Family History Family History  Problem Relation Age of Onset  . Diabetes Son   . Cancer Mother     No Known Allergies   REVIEW OF SYSTEMS (Negative unless checked)  Constitutional: [] Weight loss  [] Fever  [] Chills Cardiac: [] Chest pain   [] Chest pressure   [] Palpitations   [] Shortness of breath at rest   [] Shortness of breath with exertion. Vascular:  [] Pain in legs with walking   [x] Pain in legs at rest    [] Pain in feet at rest    [] History of DVT   [] Phlebitis   [x] Swelling in legs   [] Varicose veins   [] Non-healing ulcers Pulmonary:   [] Uses home oxygen   [] Productive cough   [] Hemoptysis   [] Wheeze  [] COPD   [] Asthma Neurologic:  [] Dizziness  [] Blackouts   [] Seizures   [] History of stroke   [] History of TIA  [] Aphasia   [] Temporary blindness   [] Dysphagia   [] Weakness or numbness in arm   [] Weakness or numbness in leg Musculoskeletal:  [] Arthritis   [] Joint swelling   [] Joint pain   [] Low back pain Hematologic:  [] Easy bruising  [] Easy bleeding   [] Hypercoagulable state   [] Anemic   Gastrointestinal:  [] Blood in stool   [] Vomiting blood  [] Gastroesophageal reflux/heartburn   [] Difficulty swallowing. Genitourinary:  [] Chronic kidney disease   [] Difficult urination  [] Frequent urination  [] Burning with urination   [] Blood in urine Skin:  [] Rashes   [x] Ulcers   Psychological:  [] History of anxiety   []  History of major depression.    Physical Examination  Vitals:   06/17/16 0931  BP: (!) 166/86  Pulse: 70  Resp: 16  Weight: 279 lb (126.6 kg)  Height: 5\' 10"  (1.778 m)   Body mass index is 40.03 kg/m. Gen:  WD/WN, NAD Head: [place]/AT, No temporalis wasting. Ear/Nose/Throat: Hearing grossly intact, nares w/o erythema or drainage,  trachea midline Eyes: PERR, EOM appear normal. Sclera non-icteric Neck: Supple, no nuchal rigidity.  No bruit or JVD.  Pulmonary:  Good air movement, equal and clear to auscultation bilaterally.  Cardiac: RRR, normal S1, S2, no Murmurs, rubs or gallops. Vascular:   Ulcer noninfected in the ankle Vessel Right Left  Radial Palpable Palpable  Ulnar Palpable Palpable  Brachial Palpable Palpable  Carotid Palpable Palpable  Aorta Not palpable N/A  Femoral Palpable Palpable  Popliteal Palpable Palpable  PT Palpable Palpable  DP Palpable Palpable   Gastrointestinal:  soft, non-rigid/non-distended. No guarding.  Musculoskeletal: M/S 5/5 throughout.  No deformity or atrophy. Neurologic: CN 2-12 intact. Pain and light touch intact in extremities.  Speech is fluent. Motor exam as listed above. Psychiatric: Judgment intact, Mood & affect appropriate for pt's clinical situation. Dermatologic: No signs consistent with cellulitis. Lymphatic:  No cervical lymphadenopathy  CBC Lab Results  Component Value Date   WBC 10.0 06/11/2016   HGB 12.6 (L) 06/11/2016   HCT 37.3 (L) 06/11/2016   MCV 90.1 06/11/2016   PLT 170 06/11/2016    BMET    Component Value Date/Time   NA 140 06/13/2016 0449   NA 142 12/02/2012 0512   K 3.7 06/13/2016 0449   K 3.1 (L) 12/02/2012 0512   CL 95 (L) 06/13/2016 0449   CL 105 12/02/2012 0512   CO2 36 (H) 06/13/2016 0449   CO2 29 12/02/2012 0512   GLUCOSE 109 (H) 06/13/2016 0449   GLUCOSE 97 12/02/2012 0512   BUN 35 (H) 06/13/2016 0449   BUN 24 (H) 12/02/2012 0512   CREATININE 1.31 (H) 06/13/2016 0449   CREATININE 1.20 12/02/2012 0512   CALCIUM 9.1 06/13/2016 0449   CALCIUM 8.2 (L) 12/02/2012 0512   GFRNONAA 50 (L) 06/13/2016 0449   GFRNONAA 59 (L) 12/02/2012 0512   GFRAA 58 (L) 06/13/2016 0449   GFRAA >60 12/02/2012 0512   Estimated Creatinine Clearance: 62.1 mL/min (by C-G formula based on SCr of 1.31 mg/dL (H)).  COAG Lab Results  Component Value  Date   INR 1.09 06/12/2010   INR 1.14 06/07/2010    Radiology Dg Chest 2 View  Result Date: 06/10/2016 CLINICAL DATA:  Lower extremity edema and shortness of breath EXAM: CHEST  2 VIEW COMPARISON:  December 14, 2015. FINDINGS: There is patchy interstitial edema. There are bilateral pleural effusions. There is atelectatic change in the lung bases. There is cardiomegaly with pulmonary venous hypertension. There is atherosclerotic calcification in the aorta. No adenopathy. There are mild lesions evident. IMPRESSION: Evidence of congestive heart failure.  Aortic atherosclerosis. Electronically Signed   By: Bretta Bang III M.D.   On: 06/10/2016 17:18      Assessment/Plan 1. 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    I have had a long discussion with the patient regarding venous insufficiency and why it  causes symptoms, specifically venous ulceration . I have discussed with the patient the chronic skin changes that accompany venous insufficiency and the long term sequela such as infection and recurring  ulceration.  Patient will be placed in [facility] which will be changed weekly drainage permitting.  In addition, behavioral modification including several periods of elevation of the lower extremities during the day will be continued. Achieving a position with the ankles at heart level was stressed to the patient  The patient is instructed to begin routine exercise, especially walking on a daily basis  Follow up in one week to reassess the degree of swelling and the control that Unna therapy is offering.   The patient can be assessed for graduated compression stockings or wraps as well as a Lymph Pump once the ulcers are healed.  - Apply unna boot  2. Chronic venous insufficiency See above - Apply unna boot  3. Lymphedema Compression and possibly a lymph pump    Levora Dredge, MD  06/17/2016 10:13 AM    This  note was created with Dragon medical transcription system.  Any errors from  dictation are purely unintentional

## 2016-06-24 ENCOUNTER — Ambulatory Visit: Payer: Medicare Other | Admitting: Family

## 2016-06-24 ENCOUNTER — Ambulatory Visit (INDEPENDENT_AMBULATORY_CARE_PROVIDER_SITE_OTHER): Payer: Medicare Other | Admitting: Vascular Surgery

## 2016-06-24 ENCOUNTER — Encounter (INDEPENDENT_AMBULATORY_CARE_PROVIDER_SITE_OTHER): Payer: Self-pay | Admitting: Vascular Surgery

## 2016-06-24 VITALS — BP 150/80 | HR 61 | Resp 16

## 2016-06-24 DIAGNOSIS — I509 Heart failure, unspecified: Secondary | ICD-10-CM

## 2016-06-24 DIAGNOSIS — I890 Lymphedema, not elsewhere classified: Secondary | ICD-10-CM | POA: Diagnosis not present

## 2016-06-24 DIAGNOSIS — I872 Venous insufficiency (chronic) (peripheral): Secondary | ICD-10-CM | POA: Diagnosis not present

## 2016-06-24 NOTE — Progress Notes (Signed)
Subjective:    Patient ID: Dylan ClintonJerry B Otoole, male    DOB: [birth year], 80 y.o.   MRN: [id number] Chief Complaint  Patient presents with  . Wound Check   Patient presents for a one week wound check. He was placed in bilateral unna boots last week for a right lower extremity calf ulceration and left lower extremity swelling. Patient states his week in unna boot has been unremarkable and he has been elevating his legs on a daily basis.    Wound Check  He was originally treated 3 to 5 days ago. Prior ED Treatment: Bilateral Unna Boots. His temperature was unmeasured prior to arrival. There has been no drainage from the wound. There is no redness present. There is no swelling present. The pain has no pain. He has no difficulty moving the affected extremity or digit.   Review of Systems  Constitutional: Negative.   HENT: Negative.   Eyes: Negative.   Respiratory: Negative.   Cardiovascular: Positive for leg swelling (Right Lower Extremity Ulceration).  Gastrointestinal: Negative.   Endocrine: Negative.   Genitourinary: Negative.   Musculoskeletal: Negative.   Skin: Negative.   Allergic/Immunologic: Negative.   Neurological: Negative.   Hematological: Negative.   Psychiatric/Behavioral: Negative.       Objective:   Physical Exam  Constitutional: He is oriented to person, place, and time. He appears well-developed and well-nourished.  HENT:  Head: Normocephalic and atraumatic.  Eyes: Conjunctivae and EOM are normal. Pupils are equal, round, and reactive to light.  Neck: Normal range of motion.  Cardiovascular: Normal rate, regular rhythm, normal heart sounds and intact distal pulses.   Pulses:      Dorsalis pedis pulses are 1+ on the right side, and 1+ on the left side.       Posterior tibial pulses are 1+ on the right side, and 1+ on the left side.  Pulmonary/Chest: Effort normal and breath sounds normal.  Abdominal: Soft. Bowel sounds are normal.  Musculoskeletal: Normal range of  motion. He exhibits edema (Edema has improved. Right lower extremity ulceration has healed. ).  Neurological: He is alert and oriented to person, place, and time.  Skin: Skin is warm and dry.  No ulcerations noted on bilateral lower extremities.  Psychiatric: He has a normal mood and affect. His behavior is normal. Judgment and thought content normal.   BP (!) 150/80   Pulse 61   Resp 16   Past Medical History:  Diagnosis Date  . CAD (coronary artery disease)   . Cervicalgia   . CHF (congestive heart failure) (HCC)   . Diastolic heart failure (HCC)   . Foot drop, right   . Hyperlipidemia   . Hypertension   . Myocardial infarction   . Osteoarthritis   . Shoulder pain, left   . Tremor, essential    Social History   Social History  . Marital status: Married    Spouse name: N/A  . Number of children: N/A  . Years of education: N/A   Occupational History  . retired    Social History Main Topics  . Smoking status: Former Smoker    Packs/day: 0.25    Years: 58.00    Types: Cigarettes  . Smokeless tobacco: Never Used  . Alcohol use No  . Drug use: No  . Sexual activity: Not on file   Other Topics Concern  . Not on file   Social History Narrative  . No narrative on file   Past Surgical History:  Procedure  Laterality Date  . BACK SURGERY    . CARDIAC CATHETERIZATION Left 04/30/2016   Procedure: Left Heart Cath and Coronary Angiography;  Surgeon: Laurier Nancy, MD;  Location: [facility] INVASIVE CV [facility];  Service: Cardiovascular;  Laterality: Left;  . CORONARY ANGIOPLASTY    . KNEE SURGERY     Family History  Problem Relation Age of Onset  . Diabetes Son   . Cancer Mother    No Known Allergies     Assessment & Plan:  Patient presents for a one week wound check. He was placed in bilateral unna boots last week for a right lower extremity calf ulceration and left lower extremity swelling. Patient states his week in unna boot has been unremarkable and he has been  elevating his legs on a daily basis.   1. Congestive heart failure, unspecified congestive heart failure chronicity, unspecified congestive heart failure type (HCC) - Stable This can be a contributing factor to the patient lower extremity edema. Encouraged compression and elevation on a daily basis to control symptoms.   2. Chronic venous insufficiency - Stable The patient was encouraged to wear graduated compression stockings (20-30 mmHg) on a daily basis. The patient was instructed to begin wearing the stockings first thing in the morning and removing them in the evening. The patient was instructed specifically not to sleep in the stockings. Prescription given. In addition, behavioral modification including elevation during the day will be continued. Anti-inflammatories for pain.  3. Lymphedema - Improved The patient was encouraged to wear graduated compression stockings (20-30 mmHg) on a daily basis. The patient was instructed to begin wearing the stockings first thing in the morning and removing them in the evening. The patient was instructed specifically not to sleep in the stockings. Prescription given. In addition, behavioral modification including elevation during the day will be continued. Anti-inflammatories for pain. We discussed a lymphedema pump if conventional therapy goes not work. The patient was advised to follow up in three weeks after wearing his compression stockings daily with elevation to assess conservative therapy.  Current Outpatient Prescriptions on File Prior to Visit  Medication Sig Dispense Refill  . albuterol (PROVENTIL HFA;VENTOLIN HFA) 108 (90 Base) MCG/ACT inhaler Inhale 2 puffs into the lungs every 6 (six) hours as needed.    Marland Kitchen aspirin 81 MG tablet Take 81 mg by mouth daily.    Marland Kitchen atorvastatin (LIPITOR) 40 MG tablet Take 40 mg by mouth daily at 6 PM.    . clopidogrel (PLAVIX) 75 MG tablet Take 75 mg by mouth daily.    Marland Kitchen docusate sodium (COLACE) 100 MG capsule  Take 200 mg by mouth at bedtime.    . hydrochlorothiazide (HYDRODIURIL) 25 MG tablet Take 25 mg by mouth daily.    . isosorbide mononitrate (IMDUR) 30 MG 24 hr tablet     . lisinopril (PRINIVIL,ZESTRIL) 5 MG tablet Take 5 mg by mouth daily.    . multivitamin-iron-minerals-folic acid (CENTRUM) chewable tablet Chew 1 tablet by mouth daily.    . nitroGLYCERIN (NITROSTAT) 0.3 MG SL tablet Place under the tongue.    . primidone (MYSOLINE) 50 MG tablet Take 1 tablet by mouth 2 times daily at 12 noon and 4 pm. 3 tabs in am and 2 tabs in pm    . propranolol ER (INDERAL LA) 120 MG 24 hr capsule Take 1 capsule by mouth daily.    Marland Kitchen spironolactone (ALDACTONE) 25 MG tablet Take 1 tablet by mouth daily after supper.     . umeclidinium-vilanterol [facility]  ELLIPTA) 62.5-25 MCG/INH AEPB Inhale 1 puff into the lungs daily.    . furosemide (LASIX) 20 MG tablet Take 1 tablet (20 mg total) by mouth 2 (two) times daily. (Patient not taking: Reported on 06/24/2016) 60 tablet 1  . hydrALAZINE (APRESOLINE) 100 MG tablet Take 1 tablet by mouth 2 (two) times daily.     No current facility-administered medications on file prior to visit.     There are no Patient Instructions on file for this visit. Return in about 3 weeks (around 07/15/2016), or Lymphedema Check.   Sulema Braid A Prayan Ulin, PA-C

## 2016-07-15 ENCOUNTER — Ambulatory Visit (INDEPENDENT_AMBULATORY_CARE_PROVIDER_SITE_OTHER): Payer: Medicare Other | Admitting: Vascular Surgery

## 2016-07-16 ENCOUNTER — Encounter (INDEPENDENT_AMBULATORY_CARE_PROVIDER_SITE_OTHER): Payer: Self-pay

## 2016-07-16 ENCOUNTER — Ambulatory Visit (INDEPENDENT_AMBULATORY_CARE_PROVIDER_SITE_OTHER): Payer: Medicare Other | Admitting: Vascular Surgery

## 2016-07-16 ENCOUNTER — Encounter (INDEPENDENT_AMBULATORY_CARE_PROVIDER_SITE_OTHER): Payer: Self-pay | Admitting: Vascular Surgery

## 2016-07-16 VITALS — BP 150/79 | HR 57 | Resp 16 | Ht 70.0 in | Wt 273.0 lb

## 2016-07-16 DIAGNOSIS — I83019 Varicose veins of right lower extremity with ulcer of unspecified site: Secondary | ICD-10-CM | POA: Diagnosis not present

## 2016-07-16 DIAGNOSIS — I890 Lymphedema, not elsewhere classified: Secondary | ICD-10-CM | POA: Diagnosis not present

## 2016-07-16 DIAGNOSIS — L97919 Non-pressure chronic ulcer of unspecified part of right lower leg with unspecified severity: Secondary | ICD-10-CM

## 2016-07-16 DIAGNOSIS — I509 Heart failure, unspecified: Secondary | ICD-10-CM

## 2016-07-16 NOTE — Progress Notes (Signed)
Subjective:    Patient ID: Dylan [name], male    DOB: Feb 11, 1936, 80 y.o.   MRN: [id number] Chief Complaint  Patient presents with  . Follow-up   Patient presents after four weeks of unna boot treatments for lymphedema exacerbation with left lower extremity calf ulcer formation. Underwent four weeks of unna boots transitioning to medical grade one compression stockings. Patient reports an overall improvement in the health of his legs. His "swelling" is controlled and his ulceration has healed. We reviewed how to correctly wear compression stockings and elevate his legs. At this point, he is not interested in pursing any additional treatment with a lymphedema boot.    Review of Systems  Constitutional: Negative.   HENT: Negative.   Eyes: Negative.   Respiratory: Negative.   Cardiovascular: Negative.   Gastrointestinal: Negative.   Endocrine: Negative.   Genitourinary: Negative.   Musculoskeletal: Negative.   Skin: Negative.   Allergic/Immunologic: Negative.   Neurological: Negative.   Hematological: Negative.   Psychiatric/Behavioral: Negative.        Objective:   Physical Exam  Constitutional: He is oriented to person, place, and time. He appears well-developed and well-nourished.  HENT:  Head: Normocephalic and atraumatic.  Eyes: Conjunctivae and EOM are normal. Pupils are equal, round, and reactive to light.  Neck: Normal range of motion.  Cardiovascular: Normal rate, regular rhythm, normal heart sounds and intact distal pulses.   Pulses:      Radial pulses are 2+ on the right side, and 2+ on the left side.       Dorsalis pedis pulses are 2+ on the right side, and 2+ on the left side.       Posterior tibial pulses are 2+ on the right side, and 2+ on the left side.  Pulmonary/Chest: Effort normal and breath sounds normal.  Abdominal: Soft. Bowel sounds are normal.  Musculoskeletal: Normal range of motion. He exhibits no edema.  Neurological: He is alert and oriented to  person, place, and time.  Skin: Skin is warm and dry.  Psychiatric: He has a normal mood and affect. His behavior is normal. Judgment and thought content normal.   BP (!) 150/79   Pulse (!) 57   Resp 16   Ht 5\' 10"  (1.778 m)   Wt 273 lb (123.8 kg)   BMI 39.17 kg/m   Past Medical History:  Diagnosis Date  . CAD (coronary artery disease)   . Cervicalgia   . CHF (congestive heart failure) (HCC)   . Diastolic heart failure (HCC)   . Foot drop, right   . Hyperlipidemia   . Hypertension   . Myocardial infarction   . Osteoarthritis   . Shoulder pain, left   . Tremor, essential    Social History   Social History  . Marital status: Married    Spouse name: N/A  . Number of children: N/A  . Years of education: N/A   Occupational History  . retired    Social History Main Topics  . Smoking status: Former Smoker    Packs/day: 0.25    Years: 58.00    Types: Cigarettes  . Smokeless tobacco: Never Used  . Alcohol use No  . Drug use: No  . Sexual activity: Not on file   Other Topics Concern  . Not on file   Social History Narrative  . No narrative on file   Past Surgical History:  Procedure Laterality Date  . BACK SURGERY    . CARDIAC CATHETERIZATION Left  04/30/2016   Procedure: Left Heart Cath and Coronary Angiography;  Surgeon: Laurier NancyShaukat A Khan, MD;  Location: [facility] INVASIVE CV LAB;  Service: Cardiovascular;  Laterality: Left;  . CORONARY ANGIOPLASTY    . KNEE SURGERY     Family History  Problem Relation Age of Onset  . Diabetes Son   . Cancer Mother    No Known Allergies     Assessment & Plan:  Patient presents after four weeks of unna boot treatments for lymphedema exacerbation with left lower extremity calf ulcer formation. Underwent four weeks of unna boots transitioning to medical grade one compression stockings. Patient reports an overall improvement in the health of his legs. His "swelling" is controlled and his ulceration has healed. We reviewed how to  correctly wear compression stockings and elevate his legs. At this point, he is not interested in pursing any additional treatment with a lymphedema boot.   1. Lymphedema - Improvement Improvement. Continue daily use of compression and elevation. Lymphedema pump is an option in future if patient wishes to pursue it.   2. 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 Continue compression and elevation.   3. Congestive heart failure, unspecified congestive heart failure chronicity, unspecified congestive heart failure type (HCC) Control important. Contributing factor in lower extremity edema.   Current Outpatient Prescriptions on File Prior to Visit  Medication Sig Dispense Refill  . albuterol (PROVENTIL HFA;VENTOLIN HFA) 108 (90 Base) MCG/ACT inhaler Inhale 2 puffs into the lungs every 6 (six) hours as needed.    Marland Kitchen. aspirin 81 MG tablet Take 81 mg by mouth daily.    Marland Kitchen. atorvastatin (LIPITOR) 40 MG tablet Take 40 mg by mouth daily at 6 PM.    . clopidogrel (PLAVIX) 75 MG tablet Take 75 mg by mouth daily.    Marland Kitchen. docusate sodium (COLACE) 100 MG capsule Take 200 mg by mouth at bedtime.    . hydrochlorothiazide (HYDRODIURIL) 25 MG tablet Take 25 mg by mouth daily.    . isosorbide mononitrate (IMDUR) 30 MG 24 hr tablet     . lisinopril (PRINIVIL,ZESTRIL) 5 MG tablet Take 5 mg by mouth daily.    . multivitamin-iron-minerals-folic acid (CENTRUM) chewable tablet Chew 1 tablet by mouth daily.    . nitroGLYCERIN (NITROSTAT) 0.3 MG SL tablet Place under the tongue.    . primidone (MYSOLINE) 50 MG tablet Take 1 tablet by mouth 2 times daily at 12 noon and 4 pm. 3 tabs in am and 2 tabs in pm    . propranolol ER (INDERAL LA) 120 MG 24 hr capsule Take 1 capsule by mouth daily.    Marland Kitchen. spironolactone (ALDACTONE) 25 MG tablet Take 1 tablet by mouth daily after supper.     . torsemide (DEMADEX) 20 MG tablet     . umeclidinium-vilanterol (ANORO ELLIPTA) 62.5-25 MCG/INH AEPB  Inhale 1 puff into the lungs daily.    . furosemide (LASIX) 20 MG tablet Take 1 tablet (20 mg total) by mouth 2 (two) times daily. (Patient not taking: Reported on 07/16/2016) 60 tablet 1  . hydrALAZINE (APRESOLINE) 100 MG tablet Take 1 tablet by mouth 2 (two) times daily.     No current facility-administered medications on file prior to visit.     There are no Patient Instructions on file for this visit. No Follow-up on file.   Jaimon Bugaj A Ekaterini Capitano, PA-C

## 2016-08-05 ENCOUNTER — Emergency Department: Payer: Medicare Other

## 2016-08-05 ENCOUNTER — Emergency Department
Admission: EM | Admit: 2016-08-05 | Discharge: 2016-08-05 | Disposition: A | Discharge status: Home / Self Care | Payer: Medicare Other | Attending: Emergency Medicine | Admitting: Emergency Medicine

## 2016-08-05 ENCOUNTER — Encounter: Payer: Self-pay | Admitting: Emergency Medicine

## 2016-08-05 DIAGNOSIS — M62830 Muscle spasm of back: Secondary | ICD-10-CM | POA: Diagnosis not present

## 2016-08-05 DIAGNOSIS — I110 Hypertensive heart disease with heart failure: Secondary | ICD-10-CM | POA: Insufficient documentation

## 2016-08-05 DIAGNOSIS — Z87891 Personal history of nicotine dependence: Secondary | ICD-10-CM | POA: Diagnosis not present

## 2016-08-05 DIAGNOSIS — I2510 Atherosclerotic heart disease of native coronary artery without angina pectoris: Secondary | ICD-10-CM | POA: Insufficient documentation

## 2016-08-05 DIAGNOSIS — Z79899 Other long term (current) drug therapy: Secondary | ICD-10-CM | POA: Diagnosis not present

## 2016-08-05 DIAGNOSIS — M549 Dorsalgia, unspecified: Secondary | ICD-10-CM | POA: Diagnosis present

## 2016-08-05 DIAGNOSIS — I509 Heart failure, unspecified: Secondary | ICD-10-CM | POA: Insufficient documentation

## 2016-08-05 DIAGNOSIS — Z9861 Coronary angioplasty status: Secondary | ICD-10-CM | POA: Insufficient documentation

## 2016-08-05 DIAGNOSIS — Z7982 Long term (current) use of aspirin: Secondary | ICD-10-CM | POA: Diagnosis not present

## 2016-08-05 MED ORDER — CYCLOBENZAPRINE HCL 10 MG PO TABS
10.0000 mg | ORAL_TABLET | Freq: Once | ORAL | Status: AC
  Administered 2016-08-05: 10 mg via ORAL
  Filled 2016-08-05: qty 1

## 2016-08-05 MED ORDER — TRAMADOL HCL 50 MG PO TABS
50.0000 mg | ORAL_TABLET | Freq: Once | ORAL | Status: AC
  Administered 2016-08-05: 50 mg via ORAL
  Filled 2016-08-05: qty 1

## 2016-08-05 MED ORDER — TRAMADOL HCL 50 MG PO TABS: 50.0000 mg | ORAL_TABLET | Freq: Two times a day (BID) | ORAL | 0 refills | Status: DC | PRN

## 2016-08-05 MED ORDER — CYCLOBENZAPRINE HCL 10 MG PO TABS: 10.0000 mg | ORAL_TABLET | Freq: Three times a day (TID) | ORAL | 0 refills | Status: DC | PRN

## 2016-08-05 NOTE — ED Triage Notes (Addendum)
Pt to ed with c/o back pain and right shoulder pain since Saturday.  Pt denies injury.  Pt states "I am having muscle spasms."  Pt states pain initially started on left lower back but then pain has radiated to all over back.

## 2016-08-05 NOTE — ED Provider Notes (Signed)
[facility] Emergency Department Provider Note   ____________________________________________   First MD Initiated Contact with Patient 08/05/16 1335     (approximate)  I have reviewed the triage vital signs and the nursing notes.   HISTORY  Chief Complaint Back Pain    HPI Dylan [name] is a 80 y.o. male patient complaining of upper back and right shoulder pain for 2 days. Patient denies any injury. Patient describes his pain as "muscle spasm". Patient says started the lower part of his back and radiating to his shoulder. Patient rates his pain as a 9/10. Patient described a pain is acute and sharp   Past Medical History:  Diagnosis Date  . CAD (coronary artery disease)   . Cervicalgia   . CHF (congestive heart failure) (HCC)   . Diastolic heart failure (HCC)   . Foot drop, right   . Hyperlipidemia   . Hypertension   . Myocardial infarction   . Osteoarthritis   . Shoulder pain, left   . Tremor, essential     Patient Active Problem List   Diagnosis Date Noted  . Varicose veins of lower extremities with ulcer (HCC) 06/17/2016  . Chronic venous insufficiency 06/17/2016  . Lymphedema 06/17/2016  . Acute on chronic diastolic CHF (congestive heart failure) (HCC) 06/10/2016  . CHF (congestive heart failure) (HCC) 12/15/2015    Past Surgical History:  Procedure Laterality Date  . BACK SURGERY    . CARDIAC CATHETERIZATION Left 04/30/2016   Procedure: Left Heart Cath and Coronary Angiography;  Surgeon: Laurier NancyShaukat A Khan, MD;  Location: [facility] INVASIVE CV LAB;  Service: Cardiovascular;  Laterality: Left;  . CORONARY ANGIOPLASTY    . KNEE SURGERY      Prior to Admission medications   Medication Sig Start Date End Date Taking? Authorizing Provider  albuterol (PROVENTIL HFA;VENTOLIN HFA) 108 (90 Base) MCG/ACT inhaler Inhale 2 puffs into the lungs every 6 (six) hours as needed. 05/24/16 05/24/17  Historical Provider, MD  aspirin 81 MG tablet Take 81 mg  by mouth daily.    Historical Provider, MD  atorvastatin (LIPITOR) 40 MG tablet Take 40 mg by mouth daily at 6 PM.    Historical Provider, MD  clopidogrel (PLAVIX) 75 MG tablet Take 75 mg by mouth daily.    Historical Provider, MD  cyclobenzaprine (FLEXERIL) 10 MG tablet Take 1 tablet (10 mg total) by mouth 3 (three) times daily as needed. 08/05/16   Joni Reiningonald K Smith, PA-C  docusate sodium (COLACE) 100 MG capsule Take 200 mg by mouth at bedtime.    Historical Provider, MD  furosemide (LASIX) 20 MG tablet Take 1 tablet (20 mg total) by mouth 2 (two) times daily. Patient not taking: Reported on 07/16/2016 12/16/15   Houston SirenVivek J Sainani, MD  hydrALAZINE (APRESOLINE) 100 MG tablet Take 1 tablet by mouth 2 (two) times daily. 11/16/15   Historical Provider, MD  hydrochlorothiazide (HYDRODIURIL) 25 MG tablet Take 25 mg by mouth daily.    Historical Provider, MD  isosorbide mononitrate (IMDUR) 30 MG 24 hr tablet  05/31/16   Historical Provider, MD  lisinopril (PRINIVIL,ZESTRIL) 5 MG tablet Take 5 mg by mouth daily.    Historical Provider, MD  multivitamin-iron-minerals-folic acid (CENTRUM) chewable tablet Chew 1 tablet by mouth daily.    Historical Provider, MD  nitroGLYCERIN (NITROSTAT) 0.3 MG SL tablet Place under the tongue. 02/09/16 02/08/17  Historical Provider, MD  primidone (MYSOLINE) 50 MG tablet Take 1 tablet by mouth 2 times daily at 12 noon and  4 pm. 3 tabs in am and 2 tabs in pm 11/16/15   Historical Provider, MD  propranolol ER (INDERAL LA) 120 MG 24 hr capsule Take 1 capsule by mouth daily. 11/22/15   Historical Provider, MD  spironolactone (ALDACTONE) 25 MG tablet Take 1 tablet by mouth daily after supper.  12/07/15   Historical Provider, MD  torsemide (DEMADEX) 20 MG tablet  06/21/16   Historical Provider, MD  traMADol (ULTRAM) 50 MG tablet Take 1 tablet (50 mg total) by mouth every 12 (twelve) hours as needed. 08/05/16   Joni Reiningonald K Smith, PA-C  umeclidinium-vilanterol (ANORO ELLIPTA) 62.5-25 MCG/INH AEPB  Inhale 1 puff into the lungs daily. 05/24/16   Historical Provider, MD    Allergies Patient has no known allergies.  Family History  Problem Relation Age of Onset  . Diabetes Son   . Cancer Mother     Social History Social History  Substance Use Topics  . Smoking status: Former Smoker    Packs/day: 0.25    Years: 58.00    Types: Cigarettes  . Smokeless tobacco: Never Used  . Alcohol use No    Review of Systems Constitutional: No fever/chills Eyes: No visual changes. ENT: No sore throat. Cardiovascular: Denies chest pain. Respiratory: Denies shortness of breath. Gastrointestinal: No abdominal pain.  No nausea, no vomiting.  No diarrhea.  No constipation. Genitourinary: Negative for dysuria. Musculoskeletal:Positive for back pain. Skin: Negative for rash. Neurological: Negative for headaches, focal weakness or numbness. Endocrine:Hyperlipidemia hypertension ____________________________________________   PHYSICAL EXAM:  VITAL SIGNS: ED Triage Vitals  Enc Vitals Group     BP 08/05/16 1124 (!) 162/75     Pulse Rate 08/05/16 1124 65     Resp 08/05/16 1124 20     Temp 08/05/16 1124 99.3 F (37.4 C)     Temp Source 08/05/16 1124 Oral     SpO2 08/05/16 1124 99 %     Weight 08/05/16 1125 273 lb (123.8 kg)     Height --      Head Circumference --      Peak Flow --      Pain Score 08/05/16 1125 0     Pain Loc --      Pain Edu? --      Excl. in GC? --     Constitutional: Alert and oriented. Well appearing and in no acute distress. Eyes: Conjunctivae are normal. PERRL. EOMI. Head: Atraumatic. Nose: No congestion/rhinnorhea. Mouth/Throat: Mucous membranes are moist.  Oropharynx non-erythematous. Neck: No stridor.  *No cervical spine tenderness to palpation. Hematological/Lymphatic/Immunilogical: No cervical lymphadenopathy. Cardiovascular: Normal rate, regular rhythm. Grossly normal heart sounds.  Good peripheral circulation. Respiratory: Normal respiratory  effort.  No retractions. Lungs CTAB. Gastrointestinal: Soft and nontender. No distention. No abdominal bruits. No CVA tenderness. Musculoskeletal: No lower extremity tenderness nor edema.  No joint effusions. Neurologic:  Normal speech and language. No gross focal neurologic deficits are appreciated. No gait instability. Skin:  Skin is warm, dry and intact. No rash noted. Psychiatric: Mood and affect are normal. Speech and behavior are normal.  ____________________________________________   LABS (all labs ordered are listed, but only abnormal results are displayed)  Labs Reviewed - No data to display ____________________________________________  EKG   ____________________________________________  RADIOLOGY  No acute findings. X-rays compared to previous findings showed compression fracture of lumbar vertebrae. ____________________________________________   PROCEDURES  Procedure(s) performed: None  Procedures  Critical Care performed: No  ____________________________________________   INITIAL IMPRESSION / ASSESSMENT AND PLAN / ED COURSE  Pertinent labs & imaging results that were available during my care of the patient were reviewed by me and considered in my medical decision making (see chart for details).  Muscle spasms upper back. Patient given discharge care instructions. Patient given prescription for Flexeril and tramadol. Patient advised to follow-up family doctor condition persists.  Clinical Course    Discussed x-ray finding with patient and wife.  ____________________________________________   FINAL CLINICAL IMPRESSION(S) / ED DIAGNOSES  Final diagnoses:  Muscle spasm of back      NEW MEDICATIONS STARTED DURING THIS VISIT:  New Prescriptions   CYCLOBENZAPRINE (FLEXERIL) 10 MG TABLET    Take 1 tablet (10 mg total) by mouth 3 (three) times daily as needed.   TRAMADOL (ULTRAM) 50 MG TABLET    Take 1 tablet (50 mg total) by mouth every 12 (twelve)  hours as needed.     Note:  This document was prepared using Dragon voice recognition software and may include unintentional dictation errors.    Joni Reining, PA-C 08/05/16 1429    Rockne Menghini, MD 08/05/16 [phone]

## 2016-08-08 ENCOUNTER — Emergency Department
Admission: EM | Admit: 2016-08-08 | Discharge: 2016-08-08 | Disposition: A | Discharge status: Home / Self Care | Payer: Medicare Other | Attending: Student in an Organized Health Care Education/Training Program | Admitting: Student in an Organized Health Care Education/Training Program

## 2016-08-08 ENCOUNTER — Encounter: Payer: Self-pay | Admitting: Emergency Medicine

## 2016-08-08 ENCOUNTER — Emergency Department: Payer: Medicare Other

## 2016-08-08 DIAGNOSIS — I110 Hypertensive heart disease with heart failure: Secondary | ICD-10-CM | POA: Insufficient documentation

## 2016-08-08 DIAGNOSIS — Z79899 Other long term (current) drug therapy: Secondary | ICD-10-CM | POA: Diagnosis not present

## 2016-08-08 DIAGNOSIS — Y999 Unspecified external cause status: Secondary | ICD-10-CM | POA: Insufficient documentation

## 2016-08-08 DIAGNOSIS — S82852A Displaced trimalleolar fracture of left lower leg, initial encounter for closed fracture: Secondary | ICD-10-CM | POA: Diagnosis not present

## 2016-08-08 DIAGNOSIS — W010XXA Fall on same level from slipping, tripping and stumbling without subsequent striking against object, initial encounter: Secondary | ICD-10-CM | POA: Diagnosis not present

## 2016-08-08 DIAGNOSIS — I5030 Unspecified diastolic (congestive) heart failure: Secondary | ICD-10-CM | POA: Insufficient documentation

## 2016-08-08 DIAGNOSIS — S8992XA Unspecified injury of left lower leg, initial encounter: Secondary | ICD-10-CM | POA: Diagnosis present

## 2016-08-08 DIAGNOSIS — Y939 Activity, unspecified: Secondary | ICD-10-CM | POA: Insufficient documentation

## 2016-08-08 DIAGNOSIS — I2510 Atherosclerotic heart disease of native coronary artery without angina pectoris: Secondary | ICD-10-CM | POA: Diagnosis not present

## 2016-08-08 DIAGNOSIS — Y929 Unspecified place or not applicable: Secondary | ICD-10-CM | POA: Diagnosis not present

## 2016-08-08 DIAGNOSIS — Z87891 Personal history of nicotine dependence: Secondary | ICD-10-CM | POA: Insufficient documentation

## 2016-08-08 MED ORDER — HYDROCODONE-ACETAMINOPHEN 5-325 MG PO TABS
1.0000 | ORAL_TABLET | Freq: Once | ORAL | Status: AC
  Administered 2016-08-08: 1 via ORAL
  Filled 2016-08-08: qty 1

## 2016-08-08 MED ORDER — POLYETHYLENE GLYCOL 3350 17 GM/SCOOP PO POWD: 1.0000 | Freq: Once | ORAL | 0 refills | Status: AC

## 2016-08-08 MED ORDER — POLYETHYLENE GLYCOL 3350 17 G PO PACK
17.0000 g | PACK | Freq: Every day | ORAL | Status: DC
  Administered 2016-08-08: 17 g via ORAL
  Filled 2016-08-08: qty 1

## 2016-08-08 NOTE — Care Management Note (Signed)
Case Management Note  Patient Details  Name: Dylan [name] MRN: [id number] Date of Birth: 1936-01-25  Subjective/Objective:       Spoke to [place] from [facility] and gave him the referral on the phone     For PT, OT and an aide if possible.   Action/Plan:   Expected Discharge Date:                  Expected Discharge Plan:     In-House Referral:     Discharge planning Services     Post Acute Care Choice:    Choice offered to:     DME Arranged:    DME Agency:     HH Arranged:    HH Agency:     Status of Service:     If discussed at [employer] Length [employer] Stay Meetings, dates discussed:    Additional Comments:  Dylan BueCheryl Myndi Wamble, RN 08/08/2016, 10:17 AM

## 2016-08-08 NOTE — ED Triage Notes (Signed)
Pt in via EMS; reports "slipping off toilet on Tuesday with my knees back under me."  Pt with complaints of worsening pain and swelling to left knee and difficulty ambulating since then.  Pt denies hitting head or LOC with fall.  Pt A/Ox4, vitals WDL, no immediate distress at this time.  MD at bedside.

## 2016-08-08 NOTE — ED Notes (Signed)
Patient transported to X-ray 

## 2016-08-08 NOTE — Care Management Note (Signed)
Case Management Note  Patient Details  Name: Dylan [name] MRN: [id number] Date of Birth: 10-28-1935  Subjective/Objective:    Spoke to patient per MD, who [facility]. He will complete the     Medicare face to face and I will contact the agency.            Action/Plan:   Expected Discharge Date:                  Expected Discharge Plan:     In-House Referral:     Discharge planning Services     Post Acute Care Choice:    Choice offered to:     DME Arranged:    DME Agency:     HH Arranged:    HH Agency:     Status of Service:     If discussed at [employer] Length [employer] Stay Meetings, dates discussed:    Additional Comments:  Berna BueCheryl Lashandra Arauz, RN 08/08/2016, 10:14 AM

## 2016-08-08 NOTE — Discharge Instructions (Signed)
Keep splint on until seen by your physician or the orthopedic physician As directed. Elevate above heart on two-three pillow during the day and night to reduce swelling and pain. Keep splint material dry. Take medications as needed for pain. Return for severe intractable pain or other concerns.

## 2016-08-08 NOTE — ED Provider Notes (Signed)
[facility] Emergency Department Provider Note    First MD Initiated Contact with Patient 08/08/16 [phone]     (approximate)  I have reviewed the triage vital signs and the nursing notes.   HISTORY  Chief Complaint Fall and Knee Pain    HPI MANUEL DALL is a 80 y.o. male who presents with progressively worsening left knee lower leg and ankle pain that started on Tuesday after the patient had a mechanical fall while getting off of this to. Patient states that he slipped and fell on her left knee and rolled his left ankle. States that he has a history of his knee giving out on him and states that this what happened on Tuesday. Patient was seen on Monday for low back pain and muscle spasm and had evidence of small vertebral compression fracture. Pain is been well controlled at home. No new numbness or tingling. No urinary or bowel incontinence. No urinary retention or hesitancy. No fevers. Patient denies any pain at this time and has been having worsening difficulty ambulating.   Past Medical History:  Diagnosis Date  . CAD (coronary artery disease)   . Cervicalgia   . CHF (congestive heart failure) (HCC)   . Diastolic heart failure (HCC)   . Foot drop, right   . Hyperlipidemia   . Hypertension   . Myocardial infarction   . Osteoarthritis   . Shoulder pain, left   . Tremor, essential    Family History  Problem Relation Age of Onset  . Diabetes Son   . Cancer Mother    Past Surgical History:  Procedure Laterality Date  . BACK SURGERY    . CARDIAC CATHETERIZATION Left 04/30/2016   Procedure: Left Heart Cath and Coronary Angiography;  Surgeon: Laurier Nancy, MD;  Location: [facility] INVASIVE CV LAB;  Service: Cardiovascular;  Laterality: Left;  . CORONARY ANGIOPLASTY    . KNEE SURGERY     Patient Active Problem List   Diagnosis Date Noted  . Varicose veins of lower extremities with ulcer (HCC) 06/17/2016  . Chronic venous insufficiency 06/17/2016  .  Lymphedema 06/17/2016  . Acute on chronic diastolic CHF (congestive heart failure) (HCC) 06/10/2016  . CHF (congestive heart failure) (HCC) 12/15/2015      Prior to Admission medications   Medication Sig Start Date End Date Taking? Authorizing Provider  albuterol (PROVENTIL HFA;VENTOLIN HFA) 108 (90 Base) MCG/ACT inhaler Inhale 2 puffs into the lungs every 6 (six) hours as needed. 05/24/16 05/24/17  Historical Provider, MD  aspirin 81 MG tablet Take 81 mg by mouth daily.    Historical Provider, MD  atorvastatin (LIPITOR) 40 MG tablet Take 40 mg by mouth daily at 6 PM.    Historical Provider, MD  clopidogrel (PLAVIX) 75 MG tablet Take 75 mg by mouth daily.    Historical Provider, MD  cyclobenzaprine (FLEXERIL) 10 MG tablet Take 1 tablet (10 mg total) by mouth 3 (three) times daily as needed. 08/05/16   Joni Reining, PA-C  docusate sodium (COLACE) 100 MG capsule Take 200 mg by mouth at bedtime.    Historical Provider, MD  furosemide (LASIX) 20 MG tablet Take 1 tablet (20 mg total) by mouth 2 (two) times daily. Patient not taking: Reported on 07/16/2016 12/16/15   Houston Siren, MD  hydrALAZINE (APRESOLINE) 100 MG tablet Take 1 tablet by mouth 2 (two) times daily. 11/16/15   Historical Provider, MD  hydrochlorothiazide (HYDRODIURIL) 25 MG tablet Take 25 mg by mouth daily.  Historical Provider, MD  isosorbide mononitrate (IMDUR) 30 MG 24 hr tablet  05/31/16   Historical Provider, MD  lisinopril (PRINIVIL,ZESTRIL) 5 MG tablet Take 5 mg by mouth daily.    Historical Provider, MD  multivitamin-iron-minerals-folic acid (CENTRUM) chewable tablet Chew 1 tablet by mouth daily.    Historical Provider, MD  nitroGLYCERIN (NITROSTAT) 0.3 MG SL tablet Place under the tongue. 02/09/16 02/08/17  Historical Provider, MD  polyethylene glycol powder (MIRALAX) powder Take 255 g by mouth once. Mix one tablespoon with 8oz of your favorite juice or water every day until you are having soft formed stools. Then start taking  once daily if you didn't have a stool the day before. 08/08/16 08/08/16  Willy EddyPatrick Rector Devonshire, MD  primidone (MYSOLINE) 50 MG tablet Take 1 tablet by mouth 2 times daily at 12 noon and 4 pm. 3 tabs in am and 2 tabs in pm 11/16/15   Historical Provider, MD  propranolol ER (INDERAL LA) 120 MG 24 hr capsule Take 1 capsule by mouth daily. 11/22/15   Historical Provider, MD  spironolactone (ALDACTONE) 25 MG tablet Take 1 tablet by mouth daily after supper.  12/07/15   Historical Provider, MD  torsemide (DEMADEX) 20 MG tablet  06/21/16   Historical Provider, MD  traMADol (ULTRAM) 50 MG tablet Take 1 tablet (50 mg total) by mouth every 12 (twelve) hours as needed. 08/05/16   Joni Reiningonald K Smith, PA-C  umeclidinium-vilanterol (ANORO ELLIPTA) 62.5-25 MCG/INH AEPB Inhale 1 puff into the lungs daily. 05/24/16   Historical Provider, MD    Allergies Patient has no known allergies.    Social History Social History  Substance Use Topics  . Smoking status: Former Smoker    Packs/day: 0.25    Years: 58.00    Types: Cigarettes  . Smokeless tobacco: Never Used  . Alcohol use No    Review of Systems Patient denies headaches, rhinorrhea, blurry vision, numbness, shortness of breath, chest pain, edema, cough, abdominal pain, nausea, vomiting, diarrhea, dysuria, fevers, rashes or hallucinations unless otherwise stated above in HPI. ____________________________________________   PHYSICAL EXAM:  VITAL SIGNS: Vitals:   08/08/16 1131 08/08/16 1134  BP: (!) 121/59   Pulse:  64  Resp:    Temp:      Constitutional: Alert and oriented. Well appearing and in no acute distress. Eyes: Conjunctivae are normal. PERRL. EOMI. Head: Atraumatic. Nose: No congestion/rhinnorhea. Mouth/Throat: Mucous membranes are moist.  Oropharynx non-erythematous. Neck: No stridor. Painless ROM. No cervical spine tenderness to palpation Hematological/Lymphatic/Immunilogical: No cervical lymphadenopathy. Cardiovascular: Normal rate, regular  rhythm. Grossly normal heart sounds.  Good peripheral circulation. Respiratory: Normal respiratory effort.  No retractions. Lungs CTAB. Gastrointestinal: Soft and nontender. No distention. No abdominal bruits. No CVA tenderness.  Musculoskeletal: Bilateral 2+ lower extremity swelling. DP and PT pulses are palpable. Tenderness to palpation to the left proximal tibia as well as ankle. No evidence of warmth or erythema overlying the joints. No effusions. There is clicking with range of motion of the left. Sensation is intact distally to light touch. Neurologic:  Normal speech and language. No gross focal neurologic deficits are appreciated. No gait instability. Skin:  Skin is warm, dry and intact. No rash noted. Psychiatric: Mood and affect are normal. Speech and behavior are normal.  ____________________________________________   LABS (all labs ordered are listed, but only abnormal results are displayed)  No results found for this or any previous visit (from the past 24 hour(s)). ____________________________________________  _________________________________  RADIOLOGY  I personally reviewed all radiographic images  ordered to evaluate for the above acute complaints and reviewed radiology reports and findings.  These findings were personally discussed with the patient.  Please see medical record for radiology report.  ____________________________________________   PROCEDURES  Procedure(s) performed: none ORTHOPEDIC INJURY TREATMENT Date/Time: 08/08/2016 10:14 AM Performed by: Willy EddyOBINSON, Riyad Keena Authorized by: Willy EddyOBINSON, Madicyn Mesina  Consent: Verbal consent obtained. Consent given by: patient Injury location: ankle Pre-procedure neurovascular assessment: neurovascularly intact Pre-procedure distal perfusion: normal Pre-procedure neurological function: normal Pre-procedure range of motion: reduced  Anesthesia: Local anesthesia used: no  Sedation: Patient sedated: no Immobilization:  splint Splint type: short leg Post-procedure neurovascular assessment: post-procedure neurovascularly intact Post-procedure distal perfusion: normal Post-procedure neurological function: normal Post-procedure range of motion: unchanged Patient tolerance: Patient tolerated the procedure well with no immediate complications       Critical Care performed: no ____________________________________________   INITIAL IMPRESSION / ASSESSMENT AND PLAN / ED COURSE  Pertinent labs & imaging results that were available during my care of the patient were reviewed by me and considered in my medical decision making (see chart for details).  DDX: fracutre, contusion, Oa, ligament injury, septic arthritis, dvt, edema  Orpah ClintonJerry B Murnane is a 80 y.o. who presents to the ED with complaint of left knee and left ankle pain.  Patient is AFVSS in ED. Exam as above. Given current presentation have considered the above differential.  No evidence of neurologic injury. Not clinically consistent with cauda equina or radiculopathy that would cause the patient's fall. Based on his acute pain will order radiographic imaging to evaluate for fracture.  Clinical Course as of Aug 08 1556  Thu Aug 08, 2016  0912 XR with evidence of triamalleolar fracture.  Will touch base with ortho.  [PR]  1015 Spoke with Dr. Hyacinth MeekerMiller regarding the patient's x-ray imaging. He reviewed imaging and does feel patient is amenable to outpatient follow-up and casting. A spoke with case manager who agrees to evaluate patient and help coordinate home health. Patient remains hemodynamic stable.  [PR]  1100 Have arranged for home PT/OT.  Patient  [PR]    Clinical Course User Index [PR] Willy EddyPatrick Keondra Haydu, MD     ____________________________________________   FINAL CLINICAL IMPRESSION(S) / ED DIAGNOSES  Final diagnoses:  Trimalleolar fracture of ankle, closed, left, initial encounter      NEW MEDICATIONS STARTED DURING THIS  VISIT:  Discharge Medication List as of 08/08/2016 11:45 AM    START taking these medications   Details  polyethylene glycol powder (MIRALAX) powder Take 255 g by mouth once. Mix one tablespoon with 8oz of your favorite juice or water every day until you are having soft formed stools. Then start taking once daily if you didn't have a stool the day before., Starting Thu 08/08/2016, Print         Note:  This document was prepared using Dragon voice recognition software and may include unintentional dictation errors.    Willy EddyPatrick Navaeh Kehres, MD 08/08/16 [phone]

## 2016-10-18 DIAGNOSIS — Z6841 Body Mass Index (BMI) 40.0 and over, adult: Secondary | ICD-10-CM

## 2016-10-18 DIAGNOSIS — G250 Essential tremor: Secondary | ICD-10-CM | POA: Insufficient documentation

## 2017-08-31 ENCOUNTER — Inpatient Hospital Stay
Admission: EM | Admit: 2017-08-31 | Discharge: 2017-09-05 | DRG: 191 | Disposition: A | Discharge status: Skilled Nursing Facility | Payer: Medicare Other | Attending: Internal Medicine | Admitting: Internal Medicine

## 2017-08-31 ENCOUNTER — Other Ambulatory Visit: Payer: Self-pay

## 2017-08-31 ENCOUNTER — Emergency Department: Payer: Medicare Other

## 2017-08-31 ENCOUNTER — Encounter: Payer: Self-pay | Admitting: Emergency Medicine

## 2017-08-31 DIAGNOSIS — L308 Other specified dermatitis: Secondary | ICD-10-CM | POA: Diagnosis present

## 2017-08-31 DIAGNOSIS — R531 Weakness: Secondary | ICD-10-CM | POA: Diagnosis present

## 2017-08-31 DIAGNOSIS — Z7982 Long term (current) use of aspirin: Secondary | ICD-10-CM

## 2017-08-31 DIAGNOSIS — I252 Old myocardial infarction: Secondary | ICD-10-CM

## 2017-08-31 DIAGNOSIS — W1839XA Other fall on same level, initial encounter: Secondary | ICD-10-CM | POA: Diagnosis present

## 2017-08-31 DIAGNOSIS — Z9861 Coronary angioplasty status: Secondary | ICD-10-CM

## 2017-08-31 DIAGNOSIS — G250 Essential tremor: Secondary | ICD-10-CM | POA: Diagnosis present

## 2017-08-31 DIAGNOSIS — J441 Chronic obstructive pulmonary disease with (acute) exacerbation: Principal | ICD-10-CM | POA: Diagnosis present

## 2017-08-31 DIAGNOSIS — Z7902 Long term (current) use of antithrombotics/antiplatelets: Secondary | ICD-10-CM

## 2017-08-31 DIAGNOSIS — M25562 Pain in left knee: Secondary | ICD-10-CM

## 2017-08-31 DIAGNOSIS — Z87891 Personal history of nicotine dependence: Secondary | ICD-10-CM

## 2017-08-31 DIAGNOSIS — Z833 Family history of diabetes mellitus: Secondary | ICD-10-CM

## 2017-08-31 DIAGNOSIS — Y92009 Unspecified place in unspecified non-institutional (private) residence as the place of occurrence of the external cause: Secondary | ICD-10-CM

## 2017-08-31 DIAGNOSIS — I5032 Chronic diastolic (congestive) heart failure: Secondary | ICD-10-CM | POA: Diagnosis present

## 2017-08-31 DIAGNOSIS — N183 Chronic kidney disease, stage 3 (moderate): Secondary | ICD-10-CM | POA: Diagnosis present

## 2017-08-31 DIAGNOSIS — N179 Acute kidney failure, unspecified: Secondary | ICD-10-CM | POA: Diagnosis present

## 2017-08-31 DIAGNOSIS — Z7952 Long term (current) use of systemic steroids: Secondary | ICD-10-CM

## 2017-08-31 DIAGNOSIS — M1712 Unilateral primary osteoarthritis, left knee: Secondary | ICD-10-CM | POA: Diagnosis present

## 2017-08-31 DIAGNOSIS — R059 Cough, unspecified: Secondary | ICD-10-CM

## 2017-08-31 DIAGNOSIS — E669 Obesity, unspecified: Secondary | ICD-10-CM | POA: Diagnosis present

## 2017-08-31 DIAGNOSIS — I130 Hypertensive heart and chronic kidney disease with heart failure and stage 1 through stage 4 chronic kidney disease, or unspecified chronic kidney disease: Secondary | ICD-10-CM | POA: Diagnosis present

## 2017-08-31 DIAGNOSIS — G4733 Obstructive sleep apnea (adult) (pediatric): Secondary | ICD-10-CM | POA: Diagnosis present

## 2017-08-31 DIAGNOSIS — Z79899 Other long term (current) drug therapy: Secondary | ICD-10-CM

## 2017-08-31 DIAGNOSIS — S8011XA Contusion of right lower leg, initial encounter: Secondary | ICD-10-CM | POA: Diagnosis present

## 2017-08-31 DIAGNOSIS — E785 Hyperlipidemia, unspecified: Secondary | ICD-10-CM | POA: Diagnosis present

## 2017-08-31 DIAGNOSIS — I2510 Atherosclerotic heart disease of native coronary artery without angina pectoris: Secondary | ICD-10-CM | POA: Diagnosis present

## 2017-08-31 DIAGNOSIS — E876 Hypokalemia: Secondary | ICD-10-CM | POA: Diagnosis present

## 2017-08-31 DIAGNOSIS — G629 Polyneuropathy, unspecified: Secondary | ICD-10-CM | POA: Diagnosis present

## 2017-08-31 DIAGNOSIS — R05 Cough: Secondary | ICD-10-CM

## 2017-08-31 LAB — URINALYSIS, COMPLETE (UACMP) WITH MICROSCOPIC
BACTERIA UA: NONE SEEN
BILIRUBIN URINE: NEGATIVE
Glucose, UA: NEGATIVE mg/dL
Hgb urine dipstick: NEGATIVE
KETONES UR: NEGATIVE mg/dL
LEUKOCYTES UA: NEGATIVE
Nitrite: NEGATIVE
Protein, ur: 30 mg/dL — AB
SQUAMOUS EPITHELIAL / LPF: NONE SEEN
Specific Gravity, Urine: 1.015 (ref 1.005–1.030)
pH: 6 (ref 5.0–8.0)

## 2017-08-31 LAB — COMPREHENSIVE METABOLIC PANEL
ALBUMIN: 3.8 g/dL (ref 3.5–5.0)
ALT: 45 U/L (ref 17–63)
ANION GAP: 8 (ref 5–15)
AST: 46 U/L — ABNORMAL HIGH (ref 15–41)
Alkaline Phosphatase: 105 U/L (ref 38–126)
BUN: 39 mg/dL — ABNORMAL HIGH (ref 6–20)
CO2: 32 mmol/L (ref 22–32)
Calcium: 9 mg/dL (ref 8.9–10.3)
Chloride: 104 mmol/L (ref 101–111)
Creatinine, Ser: 1.56 mg/dL — ABNORMAL HIGH (ref 0.61–1.24)
GFR calc Af Amer: 47 mL/min — ABNORMAL LOW (ref >60)
GFR calc non Af Amer: 41 mL/min — ABNORMAL LOW (ref >60)
GLUCOSE: 117 mg/dL — AB (ref 65–99)
POTASSIUM: 3.4 mmol/L — AB (ref 3.5–5.1)
SODIUM: 144 mmol/L (ref 135–145)
Total Bilirubin: 0.8 mg/dL (ref 0.3–1.2)
Total Protein: 7.1 g/dL (ref 6.5–8.1)

## 2017-08-31 LAB — CBC
HCT: 42.6 % (ref 40.0–52.0)
HEMOGLOBIN: 14.4 g/dL (ref 13.0–18.0)
MCH: 31.9 pg (ref 26.0–34.0)
MCHC: 33.8 g/dL (ref 32.0–36.0)
MCV: 94.4 fL (ref 80.0–100.0)
Platelets: 153 10*3/uL (ref 150–440)
RBC: 4.52 MIL/uL (ref 4.40–5.90)
RDW: 15.3 % — AB (ref 11.5–14.5)
WBC: 7.9 10*3/uL (ref 3.8–10.6)

## 2017-08-31 LAB — INFLUENZA PANEL BY PCR (TYPE A & B)
INFLAPCR: NEGATIVE
Influenza B By PCR: NEGATIVE

## 2017-08-31 LAB — TSH: TSH: 3.782 u[IU]/mL (ref 0.350–4.500)

## 2017-08-31 MED ORDER — GUAIFENESIN-DM 100-10 MG/5ML PO SYRP
5.0000 mL | ORAL_SOLUTION | ORAL | Status: DC | PRN
  Administered 2017-08-31 – 2017-09-04 (×9): 5 mL via ORAL
  Filled 2017-08-31 (×9): qty 5

## 2017-08-31 MED ORDER — ISOSORBIDE MONONITRATE ER 30 MG PO TB24
30.0000 mg | ORAL_TABLET | Freq: Every day | ORAL | Status: DC
Start: 2017-08-31 — End: 2017-09-05
  Administered 2017-08-31 – 2017-09-05 (×6): 30 mg via ORAL
  Filled 2017-08-31 (×6): qty 1

## 2017-08-31 MED ORDER — LISINOPRIL 5 MG PO TABS
5.0000 mg | ORAL_TABLET | Freq: Every day | ORAL | Status: DC
  Administered 2017-08-31 – 2017-09-05 (×6): 5 mg via ORAL
  Filled 2017-08-31 (×6): qty 1

## 2017-08-31 MED ORDER — HEPARIN SODIUM (PORCINE) 5000 UNIT/ML IJ SOLN
5000.0000 [IU] | Freq: Three times a day (TID) | INTRAMUSCULAR | Status: DC
  Administered 2017-08-31 – 2017-09-05 (×11): 5000 [IU] via SUBCUTANEOUS
  Filled 2017-08-31 (×18): qty 1

## 2017-08-31 MED ORDER — HYDRALAZINE HCL 50 MG PO TABS
100.0000 mg | ORAL_TABLET | Freq: Two times a day (BID) | ORAL | Status: DC
Start: 2017-08-31 — End: 2017-09-05
  Administered 2017-08-31 – 2017-09-05 (×9): 100 mg via ORAL
  Filled 2017-08-31 (×10): qty 2

## 2017-08-31 MED ORDER — CENTRUM PO CHEW: 1.0000 | CHEWABLE_TABLET | Freq: Every day | ORAL | Status: DC

## 2017-08-31 MED ORDER — CARVEDILOL 12.5 MG PO TABS
12.5000 mg | ORAL_TABLET | Freq: Two times a day (BID) | ORAL | Status: DC
  Administered 2017-08-31 – 2017-09-04 (×7): 12.5 mg via ORAL
  Filled 2017-08-31 (×9): qty 1

## 2017-08-31 MED ORDER — HYDROCHLOROTHIAZIDE 25 MG PO TABS
25.0000 mg | ORAL_TABLET | Freq: Every day | ORAL | Status: DC
  Administered 2017-08-31: 25 mg via ORAL
  Filled 2017-08-31: qty 1

## 2017-08-31 MED ORDER — GABAPENTIN 300 MG PO CAPS
300.0000 mg | ORAL_CAPSULE | Freq: Three times a day (TID) | ORAL | Status: DC
  Administered 2017-08-31 – 2017-09-05 (×16): 300 mg via ORAL
  Filled 2017-08-31 (×16): qty 1

## 2017-08-31 MED ORDER — OXYCODONE-ACETAMINOPHEN 5-325 MG PO TABS
1.0000 | ORAL_TABLET | Freq: Four times a day (QID) | ORAL | Status: DC | PRN
  Administered 2017-08-31 (×2): 1 via ORAL
  Filled 2017-08-31 (×2): qty 1

## 2017-08-31 MED ORDER — PROPRANOLOL HCL ER 60 MG PO CP24
120.0000 mg | ORAL_CAPSULE | Freq: Every day | ORAL | Status: DC
  Administered 2017-08-31 – 2017-09-02 (×3): 120 mg via ORAL
  Filled 2017-08-31 (×7): qty 2

## 2017-08-31 MED ORDER — ADULT MULTIVITAMIN W/MINERALS CH: 1.0000 | ORAL_TABLET | Freq: Every day | ORAL | Status: DC

## 2017-08-31 MED ORDER — ASPIRIN 81 MG PO CHEW
81.0000 mg | CHEWABLE_TABLET | Freq: Every day | ORAL | Status: DC
  Administered 2017-08-31 – 2017-09-05 (×6): 81 mg via ORAL
  Filled 2017-08-31 (×6): qty 1

## 2017-08-31 MED ORDER — HALOPERIDOL 2 MG PO TABS
2.0000 mg | ORAL_TABLET | Freq: Four times a day (QID) | ORAL | Status: DC | PRN
  Filled 2017-08-31: qty 1

## 2017-08-31 MED ORDER — ACETAMINOPHEN 650 MG RE SUPP: 650.0000 mg | Freq: Four times a day (QID) | RECTAL | Status: DC | PRN

## 2017-08-31 MED ORDER — PRIMIDONE 50 MG PO TABS
50.0000 mg | ORAL_TABLET | Freq: Two times a day (BID) | ORAL | Status: DC
  Administered 2017-08-31 – 2017-09-05 (×11): 50 mg via ORAL
  Filled 2017-08-31 (×14): qty 1

## 2017-08-31 MED ORDER — CYCLOBENZAPRINE HCL 10 MG PO TABS: 10.0000 mg | ORAL_TABLET | Freq: Three times a day (TID) | ORAL | Status: DC | PRN

## 2017-08-31 MED ORDER — NITROGLYCERIN 0.3 MG SL SUBL: 0.3000 mg | SUBLINGUAL_TABLET | SUBLINGUAL | Status: DC | PRN

## 2017-08-31 MED ORDER — UMECLIDINIUM-VILANTEROL 62.5-25 MCG/INH IN AEPB
1.0000 | INHALATION_SPRAY | Freq: Every day | RESPIRATORY_TRACT | Status: DC
  Administered 2017-08-31 – 2017-09-05 (×6): 1 via RESPIRATORY_TRACT
  Filled 2017-08-31 (×2): qty 14

## 2017-08-31 MED ORDER — ACETAMINOPHEN 325 MG PO TABS
650.0000 mg | ORAL_TABLET | Freq: Four times a day (QID) | ORAL | Status: DC | PRN
  Administered 2017-09-01: 650 mg via ORAL
  Filled 2017-08-31 (×2): qty 2

## 2017-08-31 MED ORDER — NITROGLYCERIN 0.4 MG SL SUBL: 0.4000 mg | SUBLINGUAL_TABLET | SUBLINGUAL | Status: DC | PRN

## 2017-08-31 MED ORDER — ONDANSETRON HCL 4 MG/2ML IJ SOLN: 4.0000 mg | Freq: Four times a day (QID) | INTRAMUSCULAR | Status: DC | PRN

## 2017-08-31 MED ORDER — SPIRONOLACTONE 25 MG PO TABS
25.0000 mg | ORAL_TABLET | ORAL | Status: DC
  Administered 2017-08-31 – 2017-09-04 (×5): 25 mg via ORAL
  Filled 2017-08-31 (×5): qty 1

## 2017-08-31 MED ORDER — ONDANSETRON HCL 4 MG PO TABS
4.0000 mg | ORAL_TABLET | Freq: Four times a day (QID) | ORAL | Status: DC | PRN
Start: 2017-08-31 — End: 2017-09-05

## 2017-08-31 MED ORDER — DOCUSATE SODIUM 100 MG PO CAPS
200.0000 mg | ORAL_CAPSULE | Freq: Every day | ORAL | Status: DC
  Administered 2017-08-31 – 2017-09-04 (×5): 200 mg via ORAL
  Filled 2017-08-31 (×5): qty 2

## 2017-08-31 MED ORDER — HALOPERIDOL LACTATE 5 MG/ML IJ SOLN: 2.0000 mg | Freq: Four times a day (QID) | INTRAMUSCULAR | Status: DC | PRN

## 2017-08-31 MED ORDER — TRAMADOL HCL 50 MG PO TABS
50.0000 mg | ORAL_TABLET | Freq: Two times a day (BID) | ORAL | Status: DC | PRN
  Administered 2017-08-31: 50 mg via ORAL
  Filled 2017-08-31: qty 1

## 2017-08-31 MED ORDER — ATORVASTATIN CALCIUM 20 MG PO TABS
40.0000 mg | ORAL_TABLET | Freq: Every day | ORAL | Status: DC
  Administered 2017-08-31 – 2017-09-04 (×5): 40 mg via ORAL
  Filled 2017-08-31 (×5): qty 2

## 2017-08-31 MED ORDER — TORSEMIDE 20 MG PO TABS
20.0000 mg | ORAL_TABLET | Freq: Every day | ORAL | Status: DC
  Administered 2017-08-31 – 2017-09-05 (×6): 20 mg via ORAL
  Filled 2017-08-31 (×6): qty 1

## 2017-08-31 MED ORDER — CLOPIDOGREL BISULFATE 75 MG PO TABS
75.0000 mg | ORAL_TABLET | Freq: Every day | ORAL | Status: DC
  Administered 2017-08-31 – 2017-09-02 (×3): 75 mg via ORAL
  Filled 2017-08-31 (×5): qty 1

## 2017-08-31 NOTE — Progress Notes (Signed)
PT Cancellation Note  Patient Details Name: Orpah ClintonJerry B Wessler MRN: [id number] DOB: 1935-12-04   Cancelled Treatment:    Reason Eval/Treat Not Completed: Medical issues which prohibited therapy.  Awaiting ortho consult and will see when guidance given.   Ivar DrapeRuth E Ronnesha Mester 08/31/2017, 1:40 PM   Samul Dadauth Magdelyn Roebuck, PT MS Acute Rehab Dept. Number: [facility] [phone] and [facility] [phone]

## 2017-08-31 NOTE — ED Triage Notes (Addendum)
Pt arrives via [facility] s/p fall in his home. Pt reports increased weakness in legs for several days. Typically walks with a walker but has no strength in bilateral legs. Swelling in bilateral legs. Pt reports pain in lower right leg with visible swelling and hematoma. Pt has several skin tears from fall on left hand. Stiches on right ear from dermatologist appt skin ca removal.

## 2017-08-31 NOTE — Progress Notes (Signed)
Note reviewed, saw the patient, and agree with the getting physical therapy in order consult.  Patient has large hematoma on the right leg, patient has chronic dermatitis in both legs.  Continue conservative management get physical therapy, orthopedic consult.  Continue current medications, for hypokalemia replace the potassium.

## 2017-08-31 NOTE — Care Management Obs Status (Signed)
MEDICARE OBSERVATION STATUS NOTIFICATION   Patient Details  Name: Dylan [name] MRN: [id number] Date of Birth: Apr 06, 1936   Medicare Observation Status Notification Given:  Yes    Ainara Eldridge A, RN 08/31/2017, 12:31 PM

## 2017-08-31 NOTE — H&P (Signed)
Dylan [name] is an 81 y.o. male.   Chief Complaint: Fall HPI: The patient with past medical history of CAD, diastolic heart failure, hypertension and arthritis presents to the emergency department after suffering a fall.  The patient states that his left knee which has been surgically repaired 20 years ago has been hurting much more lately.  Also over the last 2 days he has become precipitously weaker in his legs to the point where they "give out" and he falls.  The patient is tried to walk with a walker but is unable to support himself.  In the emergency department x-ray of the patient's right leg showed no fracture however he does have a very large hematoma of the right lower leg.  Due to his inability to ambulate and groin weakness emergency department staff called the hospitalist service for admission.  Past Medical History:  Diagnosis Date  . CAD (coronary artery disease)   . Cervicalgia   . CHF (congestive heart failure) ([place])   . Diastolic heart failure ([place])   . Foot drop, right   . Hyperlipidemia   . Hypertension   . Myocardial infarction ([place])   . Osteoarthritis   . Shoulder pain, left   . Tremor, essential     Past Surgical History:  Procedure Laterality Date  . BACK SURGERY    . CARDIAC CATHETERIZATION Left 04/30/2016   Procedure: Left Heart Cath and Coronary Angiography;  Surgeon: Dionisio David, MD;  Location: [place] CV LAB;  Service: Cardiovascular;  Laterality: Left;  . CORONARY ANGIOPLASTY    . KNEE SURGERY      Family History  Problem Relation Age of Onset  . Diabetes Son   . Cancer Mother    Social History:  reports that he has quit smoking. His smoking use included cigarettes. He has a 14.50 pack-year smoking history. he has never used smokeless tobacco. He reports that he does not drink alcohol or use drugs.  Allergies: No Known Allergies  Medications Prior to Admission  Medication Sig Dispense Refill  . aspirin 81 MG tablet Take 81 mg by mouth  daily.    Marland Kitchen atorvastatin (LIPITOR) 40 MG tablet Take 40 mg by mouth daily at 6 PM.    . carvedilol (COREG) 12.5 MG tablet Take 12.5 mg by mouth 2 (two) times daily.    Marland Kitchen docusate sodium (COLACE) 100 MG capsule Take 200 mg by mouth at bedtime.    . gabapentin (NEURONTIN) 300 MG capsule Take 300 mg by mouth 3 (three) times daily.     . hydrochlorothiazide (HYDRODIURIL) 25 MG tablet Take 25 mg by mouth daily.    . isosorbide mononitrate (IMDUR) 30 MG 24 hr tablet     . lisinopril (PRINIVIL,ZESTRIL) 5 MG tablet Take 5 mg by mouth daily.    . multivitamin-iron-minerals-folic acid (CENTRUM) chewable tablet Chew 1 tablet by mouth daily.    . nitroGLYCERIN (NITROSTAT) 0.3 MG SL tablet Place under the tongue.    . primidone (MYSOLINE) 50 MG tablet Take 200 tablets by mouth 2 times daily at 12 noon and 4 pm.     . spironolactone (ALDACTONE) 25 MG tablet Take 1 tablet by mouth daily.     Marland Kitchen torsemide (DEMADEX) 20 MG tablet Take 20 mg by mouth daily.       Results for orders placed or performed during the hospital encounter of 08/31/17 (from the past 48 hour(s))  CBC     Status: Abnormal   Collection Time: 08/31/17  2:59 AM  Result Value Ref Range   WBC 7.9 3.8 - 10.6 K/uL   RBC 4.52 4.40 - 5.90 MIL/uL   Hemoglobin 14.4 13.0 - 18.0 g/dL   HCT 42.6 40.0 - 52.0 %   MCV 94.4 80.0 - 100.0 fL   MCH 31.9 26.0 - 34.0 pg   MCHC 33.8 32.0 - 36.0 g/dL   RDW 15.3 (H) 11.5 - 14.5 %   Platelets 153 150 - 440 K/uL    Comment: Performed at [facility], [address] [place], [place] [address]  Comprehensive metabolic panel     Status: Abnormal   Collection Time: 08/31/17  2:59 AM  Result Value Ref Range   Sodium 144 135 - 145 mmol/L   Potassium 3.4 (L) 3.5 - 5.1 mmol/L   Chloride 104 101 - 111 mmol/L   CO2 32 22 - 32 mmol/L   Glucose, Bld 117 (H) 65 - 99 mg/dL   BUN 39 (H) 6 - 20 mg/dL   Creatinine, Ser 1.56 (H) 0.61 - 1.24 mg/dL   Calcium 9.0 8.9 - 10.3 mg/dL   Total Protein 7.1 6.5 - 8.1  g/dL   Albumin 3.8 3.5 - 5.0 g/dL   AST 46 (H) 15 - 41 U/L   ALT 45 17 - 63 U/L   Alkaline Phosphatase 105 38 - 126 U/L   Total Bilirubin 0.8 0.3 - 1.2 mg/dL   GFR calc non Af Amer 41 (L) >60 mL/min   GFR calc Af Amer 47 (L) >60 mL/min    Comment: (NOTE) The eGFR has been calculated using the CKD EPI equation. This calculation has not been validated in all clinical situations. eGFR's persistently <60 mL/min signify possible Chronic Kidney Disease.    Anion gap 8 5 - 15    Comment: Performed at [facility], [address] [place], [place] [address]  TSH     Status: None   Collection Time: 08/31/17  2:59 AM  Result Value Ref Range   TSH 3.782 0.350 - 4.500 uIU/mL    Comment: Performed by a 3rd Generation assay with a functional sensitivity of <=0.01 uIU/mL. Performed at [facility], [address] [place], [place] [address]   Urinalysis, Complete w Microscopic     Status: Abnormal   Collection Time: 08/31/17  4:10 AM  Result Value Ref Range   Color, Urine YELLOW (A) YELLOW   APPearance CLEAR (A) CLEAR   Specific Gravity, Urine 1.015 1.005 - 1.030   pH 6.0 5.0 - 8.0   Glucose, UA NEGATIVE NEGATIVE mg/dL   Hgb urine dipstick NEGATIVE NEGATIVE   Bilirubin Urine NEGATIVE NEGATIVE   Ketones, ur NEGATIVE NEGATIVE mg/dL   Protein, ur 30 (A) NEGATIVE mg/dL   Nitrite NEGATIVE NEGATIVE   Leukocytes, UA NEGATIVE NEGATIVE   RBC / HPF 0-5 0 - 5 RBC/hpf   WBC, UA 0-5 0 - 5 WBC/hpf   Bacteria, UA NONE SEEN NONE SEEN   Squamous Epithelial / LPF NONE SEEN NONE SEEN   Mucus PRESENT     Comment: Performed at [facility], [address], [place], [place] [address]   Dg Chest 2 View  Result Date: 08/31/2017 CLINICAL DATA:  81 year old male with cough. EXAM: CHEST  2 VIEW COMPARISON:  Chest radiograph dated 06/10/2016 FINDINGS: Mild bibasilar atelectatic changes. There is diffuse interstitial coarsening. No focal consolidation, pleural effusion, or  pneumothorax. Mild cardiomegaly. No acute osseous pathology. IMPRESSION: 1. No acute cardiopulmonary process. 2. Mild cardiomegaly and  chronic interstitial coarsening. Electronically Signed   By: Anner Crete M.D.   On: 08/31/2017 03:50   Dg Tibia/fibula Right  Result Date: 08/31/2017 CLINICAL DATA:  81 year old male with fall and right lower extremity pain. EXAM: RIGHT TIBIA AND FIBULA - 2 VIEW COMPARISON:  Right femur radiograph dated 06/04/2010 FINDINGS: There is no acute fracture or dislocation. Mild osteopenia. Stable sclerotic changes of the distal femur most consistent with an enchondroma. There is mild arthritic changes of the knee with meniscal calcinosis. Focal soft tissue hematoma noted in the anterolateral soft tissues of the mid calf. Small calcifications of the skin likely related to chronic venous stasis. IMPRESSION: 1. No acute fracture or dislocation. 2. Nonacute findings as described above. Electronically Signed   By: Anner Crete M.D.   On: 08/31/2017 03:53    Review of Systems  Constitutional: Negative for chills and fever.  HENT: Negative for sore throat and tinnitus.   Eyes: Negative for blurred vision and redness.  Respiratory: Negative for cough and shortness of breath.   Cardiovascular: Negative for chest pain, palpitations, orthopnea and PND.  Gastrointestinal: Negative for abdominal pain, diarrhea, nausea and vomiting.  Genitourinary: Negative for dysuria, frequency and urgency.  Musculoskeletal: Positive for falls. Negative for joint pain and myalgias.  Skin: Negative for rash.       No lesions  Neurological: Negative for speech change, focal weakness and weakness.  Endo/Heme/Allergies: Does not bruise/bleed easily.       No temperature intolerance  Psychiatric/Behavioral: Negative for depression and suicidal ideas.    Blood pressure 139/62, pulse 67, temperature 98.6 F (37 C), temperature source Oral, resp. rate (!) 21, height '5\' 9"'  (1.753 m), weight  136.1 kg (300 lb), SpO2 96 %. Physical Exam  Vitals reviewed. Constitutional: He is oriented to person, place, and time. He appears well-developed and well-nourished.  HENT:  Head: Normocephalic and atraumatic.  Mouth/Throat: Oropharynx is clear and moist.  Eyes: Conjunctivae and EOM are normal. Pupils are equal, round, and reactive to light. No scleral icterus.  Neck: Normal range of motion. Neck supple. No JVD present. No tracheal deviation present. No thyromegaly present.  Cardiovascular: Regular rhythm. Exam reveals no gallop and no friction rub.  No murmur heard. GI: Soft. Bowel sounds are normal. He exhibits no distension. There is no tenderness.  Genitourinary:  Genitourinary Comments: deferred  Musculoskeletal: Normal range of motion. He exhibits edema (feet bilarerally).  Lymphadenopathy:    He has no cervical adenopathy.  Neurological: He is alert and oriented to person, place, and time. No cranial nerve deficit.  Skin: Skin is warm and dry. No rash noted. No pallor.  Hematoma right lower leg; left knee enlarged with well-healed post-operative scar; skin tears left forearm; areas of ecchymosis everywhere  Psychiatric: He has a normal mood and affect. His behavior is normal. Judgment and thought content normal.     Assessment/Plan This is a 81 year old male admitted for weakness. 1.  Weakness: Multifactorial; obesity, arthritis, neuropathy and now large hematoma of the right leg.  Needs PT/OT eval.  Orthopedics consulted.  Hematoma may need evacuation. 2.  CKD: Stage III; avoid nephrotoxic agents.  Hydrate with intravenous fluid. 3.  Obstructive sleep apnea: The patient should use CPAP at night.  Encourage usage while hospitalized. 4.  Falls: Patient may need rehab following discharge from the hospital. 5.  CAD: Stable; continue aspirin, Plavix and Imdur 6.  Hypertension: Acceptable for age; continue carvedilol, hydralazine, hydrochlorothiazide and lisinopril 7.  COPD:  Undiagnosed however, the patient  has been on an [place] and currently has some wheezing.  Continue inhaled steroid.  Albuterol as needed 8.  Hyperlipidemia: Continue statin therapy 9. DVT prophylaxis: Heparin/SCDs 10.  GI prophylaxis: None The patient is a full code.  Time spent on admission orders and patient care approximately 45 minutes  Harrie Foreman, MD 08/31/2017, 7:17 AM

## 2017-08-31 NOTE — Progress Notes (Signed)
OT Cancellation Note  Patient Details Name: Orpah ClintonJerry B Wilcock MRN: [id number] DOB: 1936-02-17   Cancelled Treatment:     OT order received, patient admitted this date and currently has an ortho consult pending.  Will await results of consult and then proceed with OT evaluation next date.  Jaivyn Gulla T Christionna Poland, OTR/L, CLT   Diany Formosa 08/31/2017, 9:40 AM

## 2017-08-31 NOTE — ED Notes (Signed)
Steri strips placed on patients skin tears on left hand and left elbow

## 2017-09-01 ENCOUNTER — Observation Stay: Payer: Medicare Other

## 2017-09-01 NOTE — Progress Notes (Signed)
PT Cancellation Note  Patient Details Name: Dylan [name] MRN: [id number] DOB: 1936/06/25   Cancelled Treatment:    Reason Eval/Treat Not Completed: Medical issues which prohibited therapy(Ortho consult still pending; anticipate completion this AM.  Will re-attempt and initiate services as appropriate this PM.)   Silvie Obremski H. Manson PasseyBrown, PT, DPT, NCS 09/01/17, 8:43 AM [phone]

## 2017-09-01 NOTE — Progress Notes (Signed)
[facility] [place] at [facility]   PATIENT NAME: Dylan [name]    MR#:  [id number]  DATE OF BIRTH:  July 27, 1936  SUBJECTIVE: Needed for generalized weakness in the legs, falls, noted to have hematoma on the right leg and also left knee pain, having some cough.  CHIEF COMPLAINT:   Chief Complaint  Patient presents with  . Fall    REVIEW OF SYSTEMS:   ROS CONSTITUTIONAL: No fever, fatigue or weakness.  EYES: No blurred or double vision.  EARS, NOSE, AND THROAT: No tinnitus or ear pain.  RESPIRATORY: No cough, shortness of breath, wheezing or hemoptysis.  CARDIOVASCULAR: No chest pain, orthopnea, edema.  GASTROINTESTINAL: No nausea, vomiting, diarrhea or abdominal pain.  GENITOURINARY: No dysuria, hematuria.  ENDOCRINE: No polyuria, nocturia,  HEMATOLOGY: No anemia, easy bruising or bleeding SKIN: No rash or lesion. MUSCULOSKELETAL: No joint pain or arthritis.   NEUROLOGIC: No tingling, numbness, weakness.  PSYCHIATRY: No anxiety or depression.   DRUG ALLERGIES:  No Known Allergies  VITALS:  Blood pressure (!) 143/73, pulse 68, temperature 99.1 F (37.3 C), temperature source Oral, resp. rate 20, height 5\' 9"  (1.753 m), weight 133.2 kg (293 lb 11.2 oz), SpO2 95 %.  PHYSICAL EXAMINATION:  GENERAL:  81 y.o.-year-old patient lying in the bed with no acute distress.  EYES: Pupils equal, round, reactive to light and accommodation. No scleral icterus. Extraocular muscles intact.  HEENT: Head atraumatic, normocephalic. Oropharynx and nasopharynx clear.  NECK:  Supple, no jugular venous distention. No thyroid enlargement, no tenderness.  LUNGS: Normal breath sounds bilaterally, no wheezing, rales,rhonchi or crepitation. No use of accessory muscles of respiration.  CARDIOVASCULAR: S1, S2 normal. No murmurs, rubs, or gallops.  ABDOMEN: Soft, nontender, nondistended. Bowel sounds present. No organomegaly or mass.  EXTREMITIES: No pedal edema, cyanosis, or  clubbing.  NEUROLOGIC: Cranial nerves II through XII are intact. Muscle strength 5/5 in all extremities. Sensation intact. Gait not checked.  PSYCHIATRIC: The patient is alert and oriented x 3.  SKIN: No obvious rash, lesion, or ulcer.    LABORATORY PANEL:   CBC Recent Labs  Lab 08/31/17 0259  WBC 7.9  HGB 14.4  HCT 42.6  PLT 153   ------------------------------------------------------------------------------------------------------------------  Chemistries  Recent Labs  Lab 08/31/17 0259  NA 144  K 3.4*  CL 104  CO2 32  GLUCOSE 117*  BUN 39*  CREATININE 1.56*  CALCIUM 9.0  AST 46*  ALT 45  ALKPHOS 105  BILITOT 0.8   ------------------------------------------------------------------------------------------------------------------  Cardiac Enzymes No results for input(s): TROPONINI in the last 168 hours. ------------------------------------------------------------------------------------------------------------------  RADIOLOGY:  Dg Chest 2 View  Result Date: 08/31/2017 CLINICAL DATA:  81 year old male with cough. EXAM: CHEST  2 VIEW COMPARISON:  Chest radiograph dated 06/10/2016 FINDINGS: Mild bibasilar atelectatic changes. There is diffuse interstitial coarsening. No focal consolidation, pleural effusion, or pneumothorax. Mild cardiomegaly. No acute osseous pathology. IMPRESSION: 1. No acute cardiopulmonary process. 2. Mild cardiomegaly and chronic interstitial coarsening. Electronically Signed   By: Elgie CollardArash  Radparvar M.D.   On: 08/31/2017 03:50   Dg Tibia/fibula Right  Result Date: 08/31/2017 CLINICAL DATA:  81 year old male with fall and right lower extremity pain. EXAM: RIGHT TIBIA AND FIBULA - 2 VIEW COMPARISON:  Right femur radiograph dated 06/04/2010 FINDINGS: There is no acute fracture or dislocation. Mild osteopenia. Stable sclerotic changes of the distal femur most consistent with an enchondroma. There is mild arthritic changes of the knee with meniscal  calcinosis. Focal soft tissue hematoma noted in the anterolateral  soft tissues of the mid calf. Small calcifications of the skin likely related to chronic venous stasis. IMPRESSION: 1. No acute fracture or dislocation. 2. Nonacute findings as described above. Electronically Signed   By: Elgie CollardArash  Radparvar M.D.   On: 08/31/2017 03:53   Dg Knee 4 Views W/patella Left  Result Date: 09/01/2017 CLINICAL DATA:  Left knee pain and limited range of motion. Weakness. No known injury. EXAM: LEFT KNEE - COMPLETE 4+ VIEW COMPARISON:  08/08/2016. FINDINGS: Marked lateral joint space narrowing with mild progression. Spur formation involving all 3 joint compartments. Medial meniscal calcification. No effusion seen. Diffuse subcutaneous edema. IMPRESSION: 1. Tricompartmental degenerative changes, most pronounced involving the lateral compartment. 2. Chondrocalcinosis. 3. Diffuse subcutaneous edema. Electronically Signed   By: Beckie SaltsSteven  Reid M.D.   On: 09/01/2017 07:43    EKG:   Orders placed or performed during the hospital encounter of 08/31/17  . EKG 12-Lead  . EKG 12-Lead    ASSESSMENT AND PLAN:  Ambulatory difficulty secondary to knee pain: Physical therapy recommended skilled nursing. #2. large hematoma on the right leg due to fall: Decreasing: 3.  Chronic dermatitis in both legs.  Continue Unna boots. 4.  Diastolic heart failure: Stable.on diuretics,aldactone 4.  Essential hypertension: Controlled. #5 mild hypokalemia.  Replaced. 6..  Acute on chronic renal failure: Recheck kidney function.  Chronic kidney disease stage III.   All the records are reviewed and case discussed with Care Management/Social Workerr. Management plans discussed with the patient, family and they are in agreement.  CODE STATUS: full  TOTAL TIME TAKING CARE OF THIS PATIENT: 35 minutes.   POSSIBLE D/C IN 1-2DAYS, DEPENDING ON CLINICAL CONDITION.   Katha HammingSnehalatha Maurie Olesen M.D on 09/01/2017 at 2:53 PM  Between 7am to 6pm -  Pager - [phone]  After 6pm go to [email or web address] - password [email or web address] [facility]  [facility]  Office  [phone]  CC: Primary care physician; Jaclyn Shaggyate, [name], MD   Note: This dictation was prepared with Dragon dictation along with smaller phrase technology. Any transcriptional errors that result from this process are unintentional.

## 2017-09-01 NOTE — Clinical Social Work Placement (Signed)
   CLINICAL SOCIAL WORK PLACEMENT  NOTE  Date:  09/01/2017  Patient Details  Name: [name] MRN: [id number] Date of Birth: [birth year]  Clinical Social Work is seeking post-discharge placement for this patient at the Skilled  Nursing Facility level of care (*CSW will initial, date and re-position this form in  chart as items are completed):  Yes   Patient/family provided with [place] Clinical Social Work Department's list of facilities offering this level of care within the geographic area requested by the patient (or if unable, by the patient's family).  Yes   Patient/family informed of their freedom to choose among providers that offer the needed level of care, that participate in Medicare, Medicaid or managed care program needed by the patient, have an available bed and are willing to accept the patient.  Yes   Patient/family informed of 's ownership interest in [facility] and [facility], as well as of the fact that they are under no obligation to receive care at these facilities.  PASRR submitted to EDS on 09/01/17     PASRR number received on 09/01/17     Existing PASRR number confirmed on       FL2 transmitted to all facilities in geographic area requested by pt/family on 09/01/17     FL2 transmitted to all facilities within larger geographic area on       Patient informed that his/her managed care company has contracts with or will negotiate with certain facilities, including the following:            Patient/family informed of bed offers received.  Patient chooses bed at       Physician recommends and patient chooses bed at      Patient to be transferred to   on  .  Patient to be transferred to facility by       Patient family notified on   of transfer.  Name of family member notified:        PHYSICIAN       Additional Comment:    _______________________________________________ Garima Chronis, Darleen CrockerBailey M, LCSW 09/01/2017, 5:35 PM

## 2017-09-01 NOTE — Clinical Social Work Note (Signed)
Clinical Social Work Assessment  Patient Details  Name: Dylan [name] MRN: [id number] Date of Birth: 1935-12-14  Date of referral:  09/01/17               Reason for consult:  Facility Placement                Permission sought to share information with:  Chartered certified accountant granted to share information::  Yes, Verbal Permission Granted  Name::      [place]::   [place]   Relationship::     Contact Information:     Housing/Transportation Living arrangements for the past 2 months:  [place] of Information:  Patient, Spouse Patient Interpreter Needed:  None Criminal Activity/Legal Involvement Pertinent to Current Situation/Hospitalization:  No - Comment as needed Significant Relationships:  Adult Children, Spouse Lives with:  Spouse Do you feel safe going back to the place where you live?  Yes Need for family participation in patient care:  Yes (Comment)  Care giving concerns:  Patient lives in [place] with his wife [name].    Social Worker assessment / plan:  Holiday representative (CSW) received verbal consult from PT that recommendation is SNF. CSW met with patient and his wife [name] and son [name] were at bedside. Patient was alert and oriented X4 and was laying in the bed. CSW introduced self and explained role of CSW department. Patient reported that he lives in [place] with his wife. CSW explained that PT is recommending SNF and that [facility] requires authorization. Patient reported that he doesn't really want to go to SNF and prefers to go home. Patient reported that he will make a decision about SNF after he talks to the Ortho MD. Patient gave CSW permission to fax out [place] to [facility]. CSW will continue to follow and assist as needed.   Employment status:  Disabled (Comment on whether or not currently receiving Disability), Retired Nurse, adult PT Recommendations:  [place] / Referral to community resources:  [place]  Patient/Family's Response to care:  Patient is agreeable to [facility] in [place].   Patient/Family's Understanding of and Emotional Response to Diagnosis, Current Treatment, and Prognosis:  Patient and his wife were very pleasant and thanked CSW for assistance.   Emotional Assessment Appearance:  Appears stated age Attitude/Demeanor/Rapport:    Affect (typically observed):  Accepting, Adaptable, Pleasant Orientation:  Oriented to Self, Oriented to Place, Oriented to  Time, Oriented to Situation Alcohol / Substance use:  Not Applicable Psych involvement (Current and /or in the community):  No (Comment)  Discharge Needs  Concerns to be addressed:  Discharge Planning Concerns Readmission within the last 30 days:  No Current discharge risk:  Dependent with Mobility Barriers to Discharge:  Continued Medical Work up   [employer], [name], LCSW 09/01/2017, 5:36 PM

## 2017-09-01 NOTE — NC FL2 (Signed)
[place] MEDICAID FL2 LEVEL OF CARE SCREENING TOOL     IDENTIFICATION  Patient Name: Dylan [name] Birthdate: 1936-05-21 Sex: male Admission Date (Current Location): 08/31/2017  [place]   Facility and Address:  [facility], [address], [place], [place] [address]      Provider Number: [id number]  Attending Physician Name and Address:  Katha HammingKonidena, Snehalatha, MD  Relative Name and Phone Number:       Current Level of Care: Hospital Recommended Level of Care: Skilled Nursing Facility Prior Approval Number:    Date Approved/Denied:   PASRR Number: [id number])  Discharge Plan: SNF    Current Diagnoses: Patient Active Problem List   Diagnosis Date Noted  . Weakness 08/31/2017  . Varicose veins of lower extremities with ulcer (HCC) 06/17/2016  . Chronic venous insufficiency 06/17/2016  . Lymphedema 06/17/2016  . Acute on chronic diastolic CHF (congestive heart failure) (HCC) 06/10/2016  . CHF (congestive heart failure) (HCC) 12/15/2015    Orientation RESPIRATION BLADDER Height & Weight     Self, Time, Situation, Place  Normal Continent Weight: 293 lb 11.2 oz (133.2 kg) Height:  5\' 9"  (175.3 cm)  BEHAVIORAL SYMPTOMS/MOOD NEUROLOGICAL BOWEL NUTRITION STATUS      Continent Diet(Diet: Heart Healthy )  AMBULATORY STATUS COMMUNICATION OF NEEDS Skin   Extensive Assist Verbally Normal                       Personal Care Assistance Level of Assistance  Bathing, Feeding, Dressing Bathing Assistance: Limited assistance Feeding assistance: Independent Dressing Assistance: Limited assistance     Functional Limitations Info  Sight, Hearing, Speech Sight Info: Adequate Hearing Info: Adequate Speech Info: Adequate    SPECIAL CARE FACTORS FREQUENCY  PT (By licensed PT), OT (By licensed OT)     PT Frequency: (5) OT Frequency: (5)            Contractures      Additional Factors Info  Code Status,  Allergies Code Status Info: (Full Code. ) Allergies Info: (No Known Allergies. )           Current Medications (09/01/2017):  This is the current hospital active medication list Current Facility-Administered Medications  Medication Dose Route Frequency Provider Last Rate Last Dose  . acetaminophen (TYLENOL) tablet 650 mg  650 mg Oral Q6H PRN Arnaldo Nataliamond, Michael S, MD       Or  . acetaminophen (TYLENOL) suppository 650 mg  650 mg Rectal Q6H PRN Arnaldo Nataliamond, Michael S, MD      . aspirin chewable tablet 81 mg  81 mg Oral Daily Arnaldo Nataliamond, Michael S, MD   81 mg at 09/01/17 1100  . atorvastatin (LIPITOR) tablet 40 mg  40 mg Oral q1800 Arnaldo Nataliamond, Michael S, MD   40 mg at 08/31/17 1716  . carvedilol (COREG) tablet 12.5 mg  12.5 mg Oral BID Arnaldo Nataliamond, Michael S, MD   12.5 mg at 09/01/17 1100  . clopidogrel (PLAVIX) tablet 75 mg  75 mg Oral Daily Arnaldo Nataliamond, Michael S, MD   75 mg at 09/01/17 1100  . cyclobenzaprine (FLEXERIL) tablet 10 mg  10 mg Oral TID PRN Arnaldo Nataliamond, Michael S, MD      . docusate sodium (COLACE) capsule 200 mg  200 mg Oral QHS Arnaldo Nataliamond, Michael S, MD   200 mg at 08/31/17 2050  . gabapentin (NEURONTIN) capsule 300 mg  300 mg Oral TID Arnaldo Nataliamond, Michael S, MD   300 mg at 09/01/17 1100  .  guaiFENesin-dextromethorphan (ROBITUSSIN DM) 100-10 MG/5ML syrup 5 mL  5 mL Oral Q4H PRN Katha HammingKonidena, Snehalatha, MD   5 mL at 09/01/17 1108  . heparin injection 5,000 Units  5,000 Units Subcutaneous Q8H Arnaldo Nataliamond, Michael S, MD   5,000 Units at 09/01/17 1443  . hydrALAZINE (APRESOLINE) tablet 100 mg  100 mg Oral BID Arnaldo Nataliamond, Michael S, MD   100 mg at 09/01/17 1100  . isosorbide mononitrate (IMDUR) 24 hr tablet 30 mg  30 mg Oral Daily Arnaldo Nataliamond, Michael S, MD   30 mg at 09/01/17 1100  . lisinopril (PRINIVIL,ZESTRIL) tablet 5 mg  5 mg Oral Daily Arnaldo Nataliamond, Michael S, MD   5 mg at 09/01/17 1100  . ondansetron (ZOFRAN) tablet 4 mg  4 mg Oral Q6H PRN Arnaldo Nataliamond, Michael S, MD       Or  . ondansetron [facility](ZOFRAN) injection 4 mg  4 mg  Intravenous Q6H PRN Arnaldo Nataliamond, Michael S, MD      . oxyCODONE-acetaminophen (PERCOCET/ROXICET) 5-325 MG per tablet 1 tablet  1 tablet Oral Q6H PRN Arnaldo Nataliamond, Michael S, MD   1 tablet at 08/31/17 1321  . primidone (MYSOLINE) tablet 50 mg  50 mg Oral q12n4p Arnaldo Nataliamond, Michael S, MD   50 mg at 09/01/17 1110  . propranolol ER (INDERAL LA) 24 hr capsule 120 mg  120 mg Oral Daily Arnaldo Nataliamond, Michael S, MD   120 mg at 09/01/17 1100  . spironolactone (ALDACTONE) tablet 25 mg  25 mg Oral PC supper Arnaldo Nataliamond, Michael S, MD   25 mg at 08/31/17 2051  . torsemide (DEMADEX) tablet 20 mg  20 mg Oral Daily Arnaldo Nataliamond, Michael S, MD   20 mg at 09/01/17 1100  . traMADol (ULTRAM) tablet 50 mg  50 mg Oral Q12H PRN Arnaldo Nataliamond, Michael S, MD   50 mg at 08/31/17 1009  . umeclidinium-vilanterol (ANORO ELLIPTA) 62.5-25 MCG/INH 1 puff  1 puff Inhalation Daily Arnaldo Nataliamond, Michael S, MD   1 puff at 09/01/17 1100     Discharge Medications: Please see discharge summary for a list of discharge medications.  Relevant Imaging Results:  Relevant Lab Results:   Additional Information (SSN: [id number])  Vera Furniss, Darleen CrockerBailey M, LCSW
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
## 2017-09-01 NOTE — Progress Notes (Signed)
Dr. Hooten spokErnest Pinee to this writer this morning about patient. It is okay for patient to work with physical therapy. Dr. Ernest PineHooten will see the patient later today.

## 2017-09-01 NOTE — Progress Notes (Signed)
OT Cancellation Note  Patient Details Name: Dylan [name] MRN: [id number] DOB: 1935-09-26   Cancelled Treatment:    Reason Eval/Treat Not Completed: Medical issues which prohibited therapy. Order received, chart reviewed. Ortho consult still pending; anticipate completion this AM.  Will re-attempt and initiate services as appropriate this PM.   Richrd PrimeJamie Stiller, MPH, MS, OTR/L ascom [phone] 09/01/17, 11:11 AM

## 2017-09-01 NOTE — Consult Note (Signed)
ORTHOPAEDIC CONSULTATION  PATIENT NAME: Dylan ClintonJerry B Caughlin DOB: 08-29-1936  MRN: [id number]  REQUESTING PHYSICIAN: Katha HammingKonidena, Snehalatha, MD  Chief Complaint: Left knee pain  HPI: Dylan [name] is a 81 y.o. male who complains of a long history of progressive left knee pain.  The patient has a remote history of repair of a left quadriceps tendon rupture performed by Dr. Deeann SaintHoward Miller.  More recently he has had progressive valgus deformity to the left knee with increasing pain, especially along the lateral aspect of the knee.  He has become relatively sedentary at home and tends to spend most of his time in a recliner.  The patient apparently sustained a fall in the home yesterday when the left leg "buckled" and he struck the right lower leg.  He developed a large hematoma to the right lower leg and had additional difficulty walking.  His family is concerned that he has become progressively weaker and unstable with standing or even simple ambulation.  The patient was apparently fitted with a brace by Dr. Hyacinth MeekerMiller.  He has received intra-articular corticosteroid injections to the left knee with minimal benefit.  More recently he received another injection by Dr. Hyacinth MeekerMiller.  Of note, the patient already had an appointment to see me on January 22 for evaluation of the left knee.  Past Medical History:  Diagnosis Date  . CAD (coronary artery disease)   . Cervicalgia   . CHF (congestive heart failure) (HCC)   . Diastolic heart failure (HCC)   . Foot drop, right   . Hyperlipidemia   . Hypertension   . Myocardial infarction (HCC)   . Osteoarthritis   . Shoulder pain, left   . Tremor, essential    Past Surgical History:  Procedure Laterality Date  . BACK SURGERY    . CARDIAC CATHETERIZATION Left 04/30/2016   Procedure: Left Heart Cath and Coronary Angiography;  Surgeon: Laurier NancyShaukat A Khan, MD;  Location: [facility] INVASIVE CV LAB;  Service: Cardiovascular;  Laterality: Left;  . CORONARY ANGIOPLASTY    . KNEE  SURGERY     Social History   Socioeconomic History  . Marital status: Married    Spouse name: None  . Number of children: None  . Years of education: None  . Highest education level: None  Social Needs  . Financial resource strain: None  . Food insecurity - worry: None  . Food insecurity - inability: None  . Transportation needs - medical: None  . Transportation needs - non-medical: None  Occupational History  . Occupation: retired  Tobacco Use  . Smoking status: Former Smoker    Packs/day: 0.25    Years: 58.00    Pack years: 14.50    Types: Cigarettes  . Smokeless tobacco: Never Used  Substance and Sexual Activity  . Alcohol use: No    Alcohol/week: 0.0 oz  . Drug use: No  . Sexual activity: None  Other Topics Concern  . None  Social History Narrative  . None   Family History  Problem Relation Age of Onset  . Diabetes Son   . Cancer Mother    No Known Allergies Prior to Admission medications   Medication Sig Start Date End Date Taking? Authorizing Provider  aspirin 81 MG tablet Take 81 mg by mouth daily.   Yes [provider]  atorvastatin (LIPITOR) 40 MG tablet Take 40 mg by mouth daily at 6 PM.   Yes [provider]  carvedilol (COREG) 12.5 MG tablet Take 12.5 mg by mouth 2 (  two) times daily.   Yes [provider]  docusate sodium (COLACE) 100 MG capsule Take 200 mg by mouth at bedtime.   Yes [provider]  gabapentin (NEURONTIN) 300 MG capsule Take 300 mg by mouth 3 (three) times daily.    Yes [provider]  hydrochlorothiazide (HYDRODIURIL) 25 MG tablet Take 25 mg by mouth daily.   Yes [provider]  isosorbide mononitrate (IMDUR) 30 MG 24 hr tablet  05/31/16  Yes [provider]  lisinopril (PRINIVIL,ZESTRIL) 5 MG tablet Take 5 mg by mouth daily.   Yes [provider]  multivitamin-iron-minerals-folic acid (CENTRUM) chewable tablet Chew 1 tablet by mouth daily.   Yes [provider]  nitroGLYCERIN (NITROSTAT) 0.3 MG SL tablet Place under the tongue. 02/09/16 08/31/17 Yes [provider]  primidone (MYSOLINE) 50 MG tablet Take 200 tablets by mouth 2 times daily at 12 noon and 4 pm.  11/16/15  Yes [provider]  spironolactone (ALDACTONE) 25 MG tablet Take 1 tablet by mouth daily.    Yes [provider]  torsemide (DEMADEX) 20 MG tablet Take 20 mg by mouth daily.    Yes [provider]   Dg Chest 2 View  Result Date: 08/31/2017 CLINICAL DATA:  81 year old male with cough. EXAM: CHEST  2 VIEW COMPARISON:  Chest radiograph dated 06/10/2016 FINDINGS: Mild bibasilar atelectatic changes. There is diffuse interstitial coarsening. No focal consolidation, pleural effusion, or pneumothorax. Mild cardiomegaly. No acute osseous pathology. IMPRESSION: 1. No acute cardiopulmonary process. 2. Mild cardiomegaly and chronic interstitial coarsening. Electronically Signed   By: Elgie Collard M.D.   On: 08/31/2017 03:50   Dg Tibia/fibula Right  Result Date: 08/31/2017 CLINICAL DATA:  81 year old male with fall and right lower extremity pain. EXAM: RIGHT TIBIA AND FIBULA - 2 VIEW COMPARISON:  Right femur radiograph dated 06/04/2010 FINDINGS: There is no acute fracture or dislocation. Mild osteopenia. Stable sclerotic changes of the distal femur most consistent with an enchondroma. There is mild arthritic changes of the knee with meniscal calcinosis. Focal soft tissue hematoma noted in the anterolateral soft tissues of the mid calf. Small calcifications of the skin likely related to chronic venous stasis. IMPRESSION: 1. No acute fracture or dislocation. 2. Nonacute findings as described above. Electronically Signed   By: Elgie Collard M.D.   On: 08/31/2017 03:53   Dg Knee 4 Views W/patella Left  Result Date: 09/01/2017 CLINICAL DATA:  Left knee pain and limited range of motion. Weakness. No known injury. EXAM: LEFT KNEE - COMPLETE 4+ VIEW  COMPARISON:  08/08/2016. FINDINGS: Marked lateral joint space narrowing with mild progression. Spur formation involving all 3 joint compartments. Medial meniscal calcification. No effusion seen. Diffuse subcutaneous edema. IMPRESSION: 1. Tricompartmental degenerative changes, most pronounced involving the lateral compartment. 2. Chondrocalcinosis. 3. Diffuse subcutaneous edema. Electronically Signed   By: Beckie Salts M.D.   On: 09/01/2017 07:43    Positive ROS: All other systems have been reviewed and were otherwise negative with the exception of those mentioned in the HPI and as above.  Physical Exam: General: Well developed, well nourished male seen in no acute distress. HEENT: Atraumatic and normocephalic. Sclera are clear. Extraocular motion is intact. Oropharynx is clear with moist mucosa. Neck: Supple, nontender, good range of motion.  Lungs: Bilateral wheezing is noted, somewhat more pronounced on the right side. Cardiovascular: Regular rate and rhythm with normal S1 and S2. No murmurs. No gallops or rubs. Pedal pulses are palpable bilaterally. Homans test is negative  bilaterally.  Moderate pretibial and ankle edema. Abdomen: Soft, nontender, and nondistended. Bowel sounds are present. Skin: No lesions in the area of chief complaint Neurologic: Awake, alert, and oriented. Sensory function is grossly intact. Motor strength is felt to be 4-5 over 5 bilaterally with the exception of the left foot and ankle where he demonstrates a complete foot drop. No clonus or tremor. Good motor coordination. Lymphatic: No axillary or cervical lymphadenopathy  MUSCULOSKELETAL: Examination of the right lower extremity demonstrates some mild tenderness to palpation about the knee.  No gross knee effusion.  There is a large hematoma noted along the lateral aspect of the right lower leg.  Some trophic skin changes are noted extending up the lower leg.  No active dorsiflexion of the foot or toes.  Examination of  the left lower extremity demonstrates reasonably good range of motion of the left hip.  Examination of the left knee demonstrates moderate swelling.  A relative valgus deformity is noted.  There is tenderness to palpation on the lateral joint line with lateral pseudolaxity appreciated.  Well-healed surgical incisions are noted to the left knee with some soft tissue swelling noted anteriorly.  The patient is able to perform a straight leg raise, although there does appear to be some palpable atrophy to the distal portion of the extensor mechanism.  The patient demonstrates approximately 90 degrees of flexion.  Crepitance is noted with range of motion.  Trophic skin changes are noted to the right lower leg.  Assessment: Severe degenerative arthrosis of the left knee Status post remote repair of left quadriceps tendon rupture Hematoma to the right lower leg  Plan: The findings were discussed in detail with the patient and his family. The importance of using the right AFO brace at all times was emphasized with the patient due to his increased fall risk.  I reviewed the evaluation by Physical Therapy today.  The patient is extremely weak and deconditioned.  At the present time I do not believe he has the strength or endurance to tolerate total knee arthroplasty.  I would agree with recommendations for rehabilitation.  The in the meantime, the patient may benefit from a lateral unloading osteoarthritis brace for the left knee.  The patient is to follow-up with me in the office on September 23, 2017 as already scheduled.  Emiel Kielty P. Angie FavaHooten, Jr. M.D.

## 2017-09-02 ENCOUNTER — Encounter
Admission: RE | Admit: 2017-09-02 | Discharge: 2017-09-02 | Disposition: A | Discharge status: Home / Self Care | Payer: Medicare Other | Source: Ambulatory Visit | Attending: Internal Medicine | Admitting: Internal Medicine

## 2017-09-02 ENCOUNTER — Observation Stay: Payer: Medicare Other

## 2017-09-02 DIAGNOSIS — I5032 Chronic diastolic (congestive) heart failure: Secondary | ICD-10-CM | POA: Insufficient documentation

## 2017-09-02 LAB — BASIC METABOLIC PANEL
ANION GAP: 11 (ref 5–15)
BUN: 41 mg/dL — ABNORMAL HIGH (ref 6–20)
CALCIUM: 8.5 mg/dL — AB (ref 8.9–10.3)
CO2: 31 mmol/L (ref 22–32)
CREATININE: 1.51 mg/dL — AB (ref 0.61–1.24)
Chloride: 99 mmol/L — ABNORMAL LOW (ref 101–111)
GFR, EST AFRICAN AMERICAN: 49 mL/min — AB (ref >60)
GFR, EST NON AFRICAN AMERICAN: 42 mL/min — AB (ref >60)
Glucose, Bld: 123 mg/dL — ABNORMAL HIGH (ref 65–99)
Potassium: 3.7 mmol/L (ref 3.5–5.1)
SODIUM: 141 mmol/L (ref 135–145)

## 2017-09-02 MED ORDER — AZITHROMYCIN 250 MG PO TABS
250.0000 mg | ORAL_TABLET | Freq: Every day | ORAL | Status: DC
  Administered 2017-09-03 – 2017-09-05 (×3): 250 mg via ORAL
  Filled 2017-09-02 (×3): qty 1

## 2017-09-02 MED ORDER — IPRATROPIUM-ALBUTEROL 0.5-2.5 (3) MG/3ML IN SOLN
3.0000 mL | Freq: Four times a day (QID) | RESPIRATORY_TRACT | Status: DC
  Administered 2017-09-02 – 2017-09-03 (×7): 3 mL via RESPIRATORY_TRACT
  Filled 2017-09-02 (×7): qty 3

## 2017-09-02 MED ORDER — AZITHROMYCIN 500 MG PO TABS
500.0000 mg | ORAL_TABLET | Freq: Every day | ORAL | Status: AC
  Administered 2017-09-02: 500 mg via ORAL
  Filled 2017-09-02: qty 1

## 2017-09-02 MED ORDER — METHYLPREDNISOLONE SODIUM SUCC 125 MG IJ SOLR
60.0000 mg | INTRAMUSCULAR | Status: DC
  Administered 2017-09-02 – 2017-09-03 (×2): 60 mg via INTRAVENOUS
  Filled 2017-09-02 (×2): qty 2

## 2017-09-02 NOTE — Progress Notes (Signed)
Clinical Social Worker (CSW) presented SNF bed offers to patient and his wife Dylan [name]. [facility]. [facility] is agreeable [facility]. [facility] is aware of above and [facility].   [facility]Bailey Leni Pankonin, LCSW [phone]

## 2017-09-02 NOTE — Progress Notes (Signed)
Dressing to left arm changed. Telfa applied with gauze and paper tape

## 2017-09-02 NOTE — Progress Notes (Signed)
[facility] [place] [facility]   PATIENT NAME: Dylan [name]    MR#:  [id number]  DATE OF BIRTH:  Mar 13, 1936  Lots of cough, fever yesterday, he feels cough is productive.  Having knee pain, hematoma in the right leg.  Requesting sutures removed from the right ear.  CHIEF COMPLAINT:   Chief Complaint  Patient presents with  . Fall    REVIEW OF SYSTEMS:   ROS CONSTITUTIONAL: No fever, fatigue or weakness.  EYES: No blurred or double vision.  EARS, NOSE, AND THROAT: No tinnitus or ear pain.  Concern about stitches on the right ear. RESPIRATORY: Cough, wheezing, mild shortness of breath.Marland Kitchen  CARDIOVASCULAR: No chest pain, orthopnea, edema.  GASTROINTESTINAL: No nausea, vomiting, diarrhea or abdominal pain.  GENITOURINARY: No dysuria, hematuria.  ENDOCRINE: No polyuria, nocturia,  HEMATOLOGY: No anemia, easy bruising or bleeding SKIN: No rash or lesion. MUSCULOSKELETAL: Left knee pain, hematoma in the right leg. NEUROLOGIC: No tingling, numbness, weakness.  PSYCHIATRY: No anxiety or depression.   DRUG ALLERGIES:  No Known Allergies  VITALS:  Blood pressure 126/68, pulse 66, temperature 99.6 F (37.6 C), temperature source Oral, resp. rate (!) 26, height 5\' 9"  (1.753 m), weight 134.7 kg (296 lb 14.4 oz), SpO2 95 %.  PHYSICAL EXAMINATION:  GENERAL:  82 y.o.-year-old patient lying in the bed with no acute distress.  EYES: Pupils equal, round, reactive to light and accommodation. No scleral icterus. Extraocular muscles intact.  HEENT: Head atraumatic, normocephalic. Oropharynx and nasopharynx clear.  NECK:  Supple, no jugular venous distention. No thyroid enlargement, no tenderness.  LUNGS:  patient has wheezing bilaterally  cARDIOVASCULAR: S1, S2 normal. No murmurs, rubs, or gallops.  ABDOMEN: Soft, nontender, nondistended. Bowel sounds present. No organomegaly or mass.  EXTREMITIES: Shortness hematoma in the right leg, swollen left knee, chronic  dermatitis in both legs. NEUROLOGIC: Cranial nerves II through XII are intact. Muscle strength 5/5 in all extremities. Sensation intact. Gait not checked.  PSYCHIATRIC: The patient is alert and oriented x 3.  SKIN: Chronic dermatitis in both legs. LABORATORY PANEL:   CBC Recent Labs  Lab 08/31/17 0259  WBC 7.9  HGB 14.4  HCT 42.6  PLT 153   ------------------------------------------------------------------------------------------------------------------  Chemistries  Recent Labs  Lab 08/31/17 0259 09/02/17 0426  NA 144 141  K 3.4* 3.7  CL 104 99*  CO2 32 31  GLUCOSE 117* 123*  BUN 39* 41*  CREATININE 1.56* 1.51*  CALCIUM 9.0 8.5*  AST 46*  --   ALT 45  --   ALKPHOS 105  --   BILITOT 0.8  --    ------------------------------------------------------------------------------------------------------------------  Cardiac Enzymes No results for input(s): TROPONINI in the last 168 hours. ------------------------------------------------------------------------------------------------------------------  RADIOLOGY:  Dg Knee 4 Views W/patella Left  Result Date: 09/01/2017 CLINICAL DATA:  Left knee pain and limited range of motion. Weakness. No known injury. EXAM: LEFT KNEE - COMPLETE 4+ VIEW COMPARISON:  08/08/2016. FINDINGS: Marked lateral joint space narrowing with mild progression. Spur formation involving all 3 joint compartments. Medial meniscal calcification. No effusion seen. Diffuse subcutaneous edema. IMPRESSION: 1. Tricompartmental degenerative changes, most pronounced involving the lateral compartment. 2. Chondrocalcinosis. 3. Diffuse subcutaneous edema. Electronically Signed   By: Beckie Salts M.D.   On: 09/01/2017 07:43    EKG:   Orders placed or performed during the hospital encounter of 08/31/17  . EKG 12-Lead  . EKG 12-Lead    ASSESSMENT AND PLAN:  Ambulatory difficulty secondary to knee pain: Physical therapy recommended skilled  nursing and by orthopedic,  follow-up with Dr. Ernest PineHooten as an outpatient for knee pain and swelling.. #2. large hematoma on the right leg due to fall: Decreasing: continue cold compressions.ice packs,   3.  Chronic dermatitis in both legs.  Continue Unna boots. 4.  Diastolic heart failure: Stable.on diuretics,aldactone 4.  Essential hypertension: Controlled. #5 mild hypokalemia.  Replaced. 6..  Acute on chronic renal failure: Recheck kidney function.  Chronic kidney disease stage III. 7.  COPD exacerbation, possible acute bronchitis.  Evaluate for pneumonia by doing chest x-ray Bronchodilators, IV antibiotics, steroids.  Unable to discharge because patient is wheezing and also has cough and fever.  Remove the sutures from the right Ear as he requested.  He is due to get sutures removed from by PCP but he got admitted that day. .  All the records are reviewed and case discussed with Care Management/Social Workerr. Management plans discussed with the patient, family and they are in agreement.  CODE STATUS: full  TOTAL TIME TAKING CARE OF THIS PATIENT: 35 minutes.   POSSIBLE D/C IN 1-2DAYS, DEPENDING ON CLINICAL CONDITION.   Katha HammingSnehalatha Rayon Mcchristian M.D on 09/02/2017 at 8:43 AM  Between 7am to 6pm - Pager - [phone]  After 6pm go to [email or web address] - password [email or web address] [facility]  [facility]  Office  [phone]  CC: Primary care physician; Jaclyn Shaggyate, [name], MD   Note: This dictation was prepared with Dragon dictation along with smaller phrase technology. Any transcriptional errors that result from this process are unintentional.

## 2017-09-02 NOTE — Evaluation (Signed)
Occupational Therapy Evaluation Patient Details Name: Dylan [name] MRN: [id number] DOB: November 16, 1935 Today's Date: 09/02/2017    History of Present Illness 81yo male pt presented to ER status and admitted under observation status post fall in home environment with acute onset of R LE pain/hematoma.    Clinical Impression   Pt seen for OT evaluation this date. Spouse present. Pt was modified independent using RW and indep with ADL tasks up until a few weeks ago when he progressively got weaker and fell leading to this admission. Pt presents with decreased shoulder ROM bilaterally (chronic), tremors in hands (pt reports seeing rheumatologist for this), decreased strength, impaired balance, activity tolerance, and knowledge of AE/DME requiring significant assist for functional mobility and ADL tasks. Pt will benefit from skilled OT services to address noted impairments and functional deficits in order to maximize return to PLOF and minimize risk of future falls/injury/readmission. Recommend STR following hospitalization.     Follow Up Recommendations  SNF    Equipment Recommendations  3 in 1 bedside commode(bariatric 3:1)    Recommendations for Other Services       Precautions / Restrictions Precautions Precautions: Fall Restrictions Weight Bearing Restrictions: No      Mobility Bed Mobility Overal bed mobility: Needs Assistance Bed Mobility: Supine to Sit;Sit to Supine     Supine to sit: Mod assist;Max assist Sit to supine: Mod assist;Max assist      Transfers                      Balance Overall balance assessment: Needs assistance Sitting-balance support: No upper extremity supported;Feet supported Sitting balance-Leahy Scale: Good                                     ADL either performed or assessed with clinical judgement   ADL Overall ADL's : Needs assistance/impaired Eating/Feeding: Set up;Sitting Eating/Feeding Details (indicate cue type  and reason): PRN assist from spouse for opening small containers/packets due to tremors Grooming: Bed level;Set up   Upper Body Bathing: Sitting;Minimal assistance;Moderate assistance   Lower Body Bathing: Sitting/lateral leans;Moderate assistance;Maximal assistance   Upper Body Dressing : Sitting;Minimal assistance   Lower Body Dressing: Sitting/lateral leans;Moderate assistance;Maximal assistance   Toilet Transfer: Stand-pivot;BSC;Moderate assistance;+2 for physical assistance;Maximal assistance   Toileting- Clothing Manipulation and Hygiene: Maximal assistance;Sitting/lateral lean               Vision Baseline Vision/History: Wears glasses Wears Glasses: Reading only Patient Visual Report: No change from baseline       Perception     Praxis      Pertinent Vitals/Pain Pain Assessment: No/denies pain     Hand Dominance     Extremity/Trunk Assessment Upper Extremity Assessment Upper Extremity Assessment: RUE deficits/detail;LUE deficits/detail RUE Deficits / Details: shoulder flexion 2+/5, otherwise 4/5, essential tremor RUE Coordination: decreased fine motor LUE Deficits / Details: shoulder flexion 2+/5, otherwise 4/5, essential tremor LUE Coordination: decreased fine motor   Lower Extremity Assessment Lower Extremity Assessment: Generalized weakness(3-/5 bilaterally, hematoma on lateral side of RLE)   Cervical / Trunk Assessment Cervical / Trunk Assessment: Normal   Communication Communication Communication: No difficulties   Cognition Arousal/Alertness: Awake/alert Behavior During Therapy: WFL for tasks assessed/performed Overall Cognitive Status: Within Functional Limits for tasks assessed  General Comments       Exercises     Shoulder Instructions      Home Living Family/patient expects to be discharged to:: Private residence Living Arrangements: Spouse/significant other Available Help at  Discharge: Family(spouse with recent back surgery, unable to provide much physical assist) Type of Home: House Home Access: Ramped entrance     Home Layout: One level     Bathroom Shower/Tub: Tub/shower unit;Walk-in shower(uses walk in)   Bathroom Toilet: Handicapped height     Home Equipment: Environmental consultant - 2 wheels          Prior Functioning/Environment Level of Independence: Independent with assistive device(s)        Comments: Mod indep with ADLs, household and limited community mobility with RW; endorses progressive LE weakness in past 2-3 weeks leading to fall and this admission        OT Problem List: Decreased strength;Decreased knowledge of use of DME or AE;Decreased range of motion;Decreased activity tolerance;Impaired UE functional use      OT Treatment/Interventions: Self-care/ADL training;Therapeutic exercise;Therapeutic activities;Energy conservation;DME and/or AE instruction;Patient/family education    OT Goals(Current goals can be found in the care plan section) Acute Rehab OT Goals Patient Stated Goal: to go to rehab to get stronger OT Goal Formulation: With patient/family Time For Goal Achievement: 09/16/17 Potential to Achieve Goals: Good ADL Goals Pt Will Perform Lower Body Dressing: sit to/from stand;with adaptive equipment;with min guard assist(min guard x2 during transitional movements/standing) Pt Will Transfer to Toilet: with min guard assist;stand pivot transfer;bedside commode(min guard x2)  OT Frequency: Min 1X/week   Barriers to D/C: Decreased caregiver support;Inaccessible home environment          Co-evaluation              AM-PAC PT "6 Clicks" Daily Activity     Outcome Measure Help from another person eating meals?: A Little Help from another person taking care of personal grooming?: A Little Help from another person toileting, which includes using toliet, bedpan, or urinal?: A Lot Help from another person bathing (including  washing, rinsing, drying)?: A Lot Help from another person to put on and taking off regular upper body clothing?: A Little Help from another person to put on and taking off regular lower body clothing?: A Lot 6 Click Score: 15   End of Session    Activity Tolerance: Patient tolerated treatment well Patient left: in bed;with call bell/phone within reach;with bed alarm set;with family/visitor present  OT Visit Diagnosis: Other abnormalities of gait and mobility (R26.89);Muscle weakness (generalized) (M62.81)                Time: 4098-1191 OT Time Calculation (min): 34 min Charges:  OT General Charges $OT Visit: 1 Visit OT Evaluation $OT Eval Low Complexity: 1 Low OT Treatments $Self Care/Home Management : 8-22 mins G-Codes: OT G-codes **NOT FOR INPATIENT CLASS** Functional Assessment Tool Used: AM-PAC 6 Clicks Daily Activity;Clinical judgement Functional Limitation: Self care Self Care Current Status (Y7829): At least 40 percent but less than 60 percent impaired, limited or restricted Self Care Goal Status (F6213): At least 20 percent but less than 40 percent impaired, limited or restricted   Richrd Prime, MPH, MS, OTR/L ascom [phone] 09/02/17, 12:25 PM

## 2017-09-02 NOTE — Progress Notes (Signed)
R ear sutures removed by RN. Pt has moderate yellow/green purulent drainage from suture sites. MD notified. Will continue to monitor. Pt is currently on PO antibiotic.

## 2017-09-02 NOTE — Progress Notes (Signed)
Pt Heart rate 45. BP 119/55. Dr. Anne HahnWillis made aware. Order to hold Coreg and Hydralazine, and to put cardiac monitor obtained.

## 2017-09-03 DIAGNOSIS — G4733 Obstructive sleep apnea (adult) (pediatric): Secondary | ICD-10-CM | POA: Diagnosis present

## 2017-09-03 DIAGNOSIS — Y92009 Unspecified place in unspecified non-institutional (private) residence as the place of occurrence of the external cause: Secondary | ICD-10-CM | POA: Diagnosis not present

## 2017-09-03 DIAGNOSIS — Z833 Family history of diabetes mellitus: Secondary | ICD-10-CM | POA: Diagnosis not present

## 2017-09-03 DIAGNOSIS — N183 Chronic kidney disease, stage 3 (moderate): Secondary | ICD-10-CM | POA: Diagnosis present

## 2017-09-03 DIAGNOSIS — E669 Obesity, unspecified: Secondary | ICD-10-CM | POA: Diagnosis present

## 2017-09-03 DIAGNOSIS — E785 Hyperlipidemia, unspecified: Secondary | ICD-10-CM | POA: Diagnosis present

## 2017-09-03 DIAGNOSIS — G250 Essential tremor: Secondary | ICD-10-CM | POA: Diagnosis present

## 2017-09-03 DIAGNOSIS — E876 Hypokalemia: Secondary | ICD-10-CM | POA: Diagnosis present

## 2017-09-03 DIAGNOSIS — J441 Chronic obstructive pulmonary disease with (acute) exacerbation: Secondary | ICD-10-CM | POA: Diagnosis present

## 2017-09-03 DIAGNOSIS — I5032 Chronic diastolic (congestive) heart failure: Secondary | ICD-10-CM | POA: Diagnosis present

## 2017-09-03 DIAGNOSIS — Z7952 Long term (current) use of systemic steroids: Secondary | ICD-10-CM | POA: Diagnosis not present

## 2017-09-03 DIAGNOSIS — I2510 Atherosclerotic heart disease of native coronary artery without angina pectoris: Secondary | ICD-10-CM | POA: Diagnosis present

## 2017-09-03 DIAGNOSIS — Z7902 Long term (current) use of antithrombotics/antiplatelets: Secondary | ICD-10-CM | POA: Diagnosis not present

## 2017-09-03 DIAGNOSIS — M1712 Unilateral primary osteoarthritis, left knee: Secondary | ICD-10-CM | POA: Diagnosis present

## 2017-09-03 DIAGNOSIS — Z7982 Long term (current) use of aspirin: Secondary | ICD-10-CM | POA: Diagnosis not present

## 2017-09-03 DIAGNOSIS — G629 Polyneuropathy, unspecified: Secondary | ICD-10-CM | POA: Diagnosis present

## 2017-09-03 DIAGNOSIS — N179 Acute kidney failure, unspecified: Secondary | ICD-10-CM | POA: Diagnosis present

## 2017-09-03 DIAGNOSIS — Z79899 Other long term (current) drug therapy: Secondary | ICD-10-CM | POA: Diagnosis not present

## 2017-09-03 DIAGNOSIS — R531 Weakness: Secondary | ICD-10-CM | POA: Diagnosis present

## 2017-09-03 DIAGNOSIS — Z9861 Coronary angioplasty status: Secondary | ICD-10-CM | POA: Diagnosis not present

## 2017-09-03 DIAGNOSIS — W1839XA Other fall on same level, initial encounter: Secondary | ICD-10-CM | POA: Diagnosis present

## 2017-09-03 DIAGNOSIS — S8011XA Contusion of right lower leg, initial encounter: Secondary | ICD-10-CM | POA: Diagnosis present

## 2017-09-03 DIAGNOSIS — I130 Hypertensive heart and chronic kidney disease with heart failure and stage 1 through stage 4 chronic kidney disease, or unspecified chronic kidney disease: Secondary | ICD-10-CM | POA: Diagnosis present

## 2017-09-03 DIAGNOSIS — Z87891 Personal history of nicotine dependence: Secondary | ICD-10-CM | POA: Diagnosis not present

## 2017-09-03 DIAGNOSIS — L308 Other specified dermatitis: Secondary | ICD-10-CM | POA: Diagnosis present

## 2017-09-03 DIAGNOSIS — I252 Old myocardial infarction: Secondary | ICD-10-CM | POA: Diagnosis not present

## 2017-09-03 MED ORDER — IPRATROPIUM-ALBUTEROL 0.5-2.5 (3) MG/3ML IN SOLN
3.0000 mL | Freq: Three times a day (TID) | RESPIRATORY_TRACT | Status: DC
  Administered 2017-09-04 – 2017-09-05 (×5): 3 mL via RESPIRATORY_TRACT
  Filled 2017-09-03 (×5): qty 3

## 2017-09-03 MED ORDER — PRIMIDONE 50 MG PO TABS: 50.0000 mg | ORAL_TABLET | Freq: Two times a day (BID) | ORAL | Status: DC

## 2017-09-03 MED ORDER — IPRATROPIUM-ALBUTEROL 0.5-2.5 (3) MG/3ML IN SOLN: 3.0000 mL | Freq: Four times a day (QID) | RESPIRATORY_TRACT | Status: DC

## 2017-09-03 MED ORDER — HYDRALAZINE HCL 100 MG PO TABS: 100.0000 mg | ORAL_TABLET | Freq: Two times a day (BID) | ORAL | Status: DC

## 2017-09-03 MED ORDER — METHYLPREDNISOLONE SODIUM SUCC 40 MG IJ SOLR
40.0000 mg | Freq: Four times a day (QID) | INTRAMUSCULAR | Status: DC
  Administered 2017-09-03 – 2017-09-04 (×5): 40 mg via INTRAVENOUS
  Filled 2017-09-03 (×5): qty 1

## 2017-09-03 MED ORDER — UMECLIDINIUM-VILANTEROL 62.5-25 MCG/INH IN AEPB: 1.0000 | INHALATION_SPRAY | Freq: Every day | RESPIRATORY_TRACT | Status: DC

## 2017-09-03 MED ORDER — PROPRANOLOL HCL ER 120 MG PO CP24: 120.0000 mg | ORAL_CAPSULE | Freq: Every day | ORAL | Status: DC

## 2017-09-03 MED ORDER — ALUM & MAG HYDROXIDE-SIMETH 200-200-20 MG/5ML PO SUSP
15.0000 mL | Freq: Four times a day (QID) | ORAL | Status: DC | PRN
  Administered 2017-09-03: 15 mL via ORAL
  Filled 2017-09-03 (×2): qty 30

## 2017-09-03 MED ORDER — CLOPIDOGREL BISULFATE 75 MG PO TABS: 75.0000 mg | ORAL_TABLET | Freq: Every day | ORAL | Status: DC

## 2017-09-03 MED ORDER — GUAIFENESIN-DM 100-10 MG/5ML PO SYRP: 5.0000 mL | ORAL_SOLUTION | ORAL | 0 refills | Status: DC | PRN

## 2017-09-03 MED ORDER — NICOTINE 14 MG/24HR TD PT24
14.0000 mg | MEDICATED_PATCH | Freq: Every day | TRANSDERMAL | Status: DC
  Filled 2017-09-03: qty 1

## 2017-09-03 NOTE — Progress Notes (Signed)
[facility] has been received. Per MD patient is not stable for D/C today. Marcelino DusterMichelle is aware of above. Clinical Social Worker (CSW) will continue to follow and assist as needed.    [employer] IncorporatedBailey Emerson Schreifels, LCSW [phone]

## 2017-09-03 NOTE — Progress Notes (Signed)
Occupational Therapy Treatment Patient Details Name: Dylan [name] MRN: [id number] DOB: Oct 11, 1935 Today's Date: 09/03/2017    History of present illness 82yo male pt presented to ER status and admitted under observation status post fall in home environment with acute onset of R LE pain/hematoma.    OT comments  Pt seen for co-tx for safety with focus on functional mobility and ADL tasks. Pt performed bed mobility and functional transfers with no LOB, good compensatory techniques to minimize impact of R foot drop. VSS throughout. Once seated, pt/spouse educated in energy conservation strategies with handout provided as well as LB dressing using AE. Pt progressing towards goals. Will continue to progress. STR remains appropriate.    Follow Up Recommendations  SNF    Equipment Recommendations       Recommendations for Other Services      Precautions / Restrictions Precautions Precautions: Fall Restrictions Weight Bearing Restrictions: No       Mobility Bed Mobility Overal bed mobility: Needs Assistance Bed Mobility: Supine to Sit     Supine to sit: Min assist     General bed mobility comments: min assist x1 by PT   Transfers Overall transfer level: Needs assistance Equipment used: Rolling walker (2 wheeled) Transfers: Sit to/from Stand Sit to Stand: Min guard;Min assist         General transfer comment: min assist x1 min guard x1    Balance Overall balance assessment: Needs assistance Sitting-balance support: No upper extremity supported;Feet supported Sitting balance-Leahy Scale: Good     Standing balance support: Bilateral upper extremity supported Standing balance-Leahy Scale: Fair                             ADL either performed or assessed with clinical judgement   ADL Overall ADL's : Needs assistance/impaired                       Lower Body Dressing Details (indicate cue type and reason): pt/spouse educated in use of reacher for  LB dressing to minimize SOB, energy conservation and improve functional independence                     Vision       Perception     Praxis      Cognition Arousal/Alertness: Awake/alert Behavior During Therapy: WFL for tasks assessed/performed Overall Cognitive Status: Within Functional Limits for tasks assessed                                          Exercises Other Exercises Other Exercises: pt/spouse educated in energy conservation strategies including home/routines modifications, AE/DME, falls prevention with handout provided. Would benefit from additional training to support recall and carryover    Shoulder Instructions       General Comments      Pertinent Vitals/ Pain       Pain Assessment: Faces Faces Pain Scale: Hurts little more Pain Location: L knee, R LE Pain Descriptors / Indicators: Aching;Grimacing;Guarding Pain Intervention(s): Limited activity within patient's tolerance;Monitored during session  Home Living                                          Prior Functioning/Environment  Frequency  Min 1X/week        Progress Toward Goals  OT Goals(current goals can now be found in the care plan section)  Progress towards OT goals: Progressing toward goals  Acute Rehab OT Goals Patient Stated Goal: to go to rehab to get stronger OT Goal Formulation: With patient/family Time For Goal Achievement: 09/16/17 Potential to Achieve Goals: Good  Plan Discharge plan remains appropriate;Frequency remains appropriate    Co-evaluation    PT/OT/SLP Co-Evaluation/Treatment: Yes Reason for Co-Treatment: For patient/therapist safety;To address functional/ADL transfers PT goals addressed during session: Mobility/safety with mobility OT goals addressed during session: ADL's and self-care;Proper use of Adaptive equipment and DME      AM-PAC PT "6 Clicks" Daily Activity     Outcome Measure   Help  from another person eating meals?: A Little Help from another person taking care of personal grooming?: A Little Help from another person toileting, which includes using toliet, bedpan, or urinal?: A Little Help from another person bathing (including washing, rinsing, drying)?: A Lot Help from another person to put on and taking off regular upper body clothing?: A Little Help from another person to put on and taking off regular lower body clothing?: A Lot 6 Click Score: 16    End of Session Equipment Utilized During Treatment: Gait belt  OT Visit Diagnosis: Other abnormalities of gait and mobility (R26.89);Muscle weakness (generalized) (M62.81)   Activity Tolerance Patient tolerated treatment well   Patient Left in bed;with call bell/phone within reach;with bed alarm set;with family/visitor present   Nurse Communication          Time: 1610-9604 OT Time Calculation (min): 20 min  Charges: OT General Charges $OT Visit: 1 Visit OT Treatments $Self Care/Home Management : 8-22 mins  Richrd Prime, MPH, MS, OTR/L ascom [phone] 09/03/17, 3:57 PM

## 2017-09-03 NOTE — Progress Notes (Addendum)
Pt has been alert and oriented x4 throughout shift. Pt has had non-productive cough and wheezing- Robitussin and duonebs decreased symptoms per pt. RLE has been elevated with ice applied. [employer] came this morning to take measurements for brace- stated it may take 1-2 days and would [facility] to [facility] if needed.  [place], Latricia HeftKorie G

## 2017-09-03 NOTE — Discharge Summary (Addendum)
[facility] - [place] [facility]   PATIENT NAME: Dylan [name]    MR#:  [id number]  DATE OF BIRTH:  21-Sep-1935  DATE OF ADMISSION:  08/31/2017   ADMITTING PHYSICIAN: Arnaldo Natal, MD  DATE OF DISCHARGE: 09/04/2017  PRIMARY CARE PHYSICIAN: Dylan Shaggy, MD   ADMISSION DIAGNOSIS:  [facility] - Fall DISCHARGE DIAGNOSIS:  Active Problems:   Weakness  SECONDARY DIAGNOSIS:   Past Medical History:  Diagnosis Date  . CAD (coronary artery disease)   . Cervicalgia   . CHF (congestive heart failure) (HCC)   . Diastolic heart failure (HCC)   . Foot drop, right   . Hyperlipidemia   . Hypertension   . Myocardial infarction (HCC)   . Osteoarthritis   . Shoulder pain, left   . Tremor, essential    HOSPITAL COURSE:  Ambulatory difficulty secondary to knee pain: Physical therapy recommended skilled nursing and by orthopedic, follow-up with Dr. Ernest Pine as an outpatient for knee pain and swelling.. #2. large hematoma on the right leg due to fall: Decreasing: continue cold compressions.ice packs, 3.  Chronic dermatitis in both legs.  Continue Unna boots. 4.  Diastolic heart failure: Stable.on diuretics,aldactone 4.  Essential hypertension: Controlled. #5 mild hypokalemia.  improved. 6..  Acute on chronic renal failure:  Stable. Chronic kidney disease stage III. 7.  COPD exacerbation, possible acute bronchitis.  No pneumonia per chest x-ray. He is treated with ronchodilators, antibiotics, steroids. Robitussin prn. But still has a lot of cough and wheezing. Increased iv solumedrol to q6h.  * tobacco abuse.  Smoking cessation was counseled for 3- 4 minutes. Nicotine patch. DISCHARGE CONDITIONS:  Unable to discharge today, possible discharge to SNF in 2 days. CONSULTS OBTAINED:  Treatment Team:  Donato Heinz, MD DRUG ALLERGIES:  No Known Allergies DISCHARGE MEDICATIONS:   Allergies as of 09/03/2017   No Known Allergies     Medication List    STOP taking  these medications   hydrochlorothiazide 25 MG tablet Commonly known as:  HYDRODIURIL     TAKE these medications   aspirin 81 MG tablet Take 81 mg by mouth daily.   atorvastatin 40 MG tablet Commonly known as:  LIPITOR Take 40 mg by mouth daily at 6 PM.   carvedilol 12.5 MG tablet Commonly known as:  COREG Take 12.5 mg by mouth 2 (two) times daily.   clopidogrel 75 MG tablet Commonly known as:  PLAVIX Take 1 tablet (75 mg total) by mouth daily.   docusate sodium 100 MG capsule Commonly known as:  COLACE Take 200 mg by mouth at bedtime.   gabapentin 300 MG capsule Commonly known as:  NEURONTIN Take 300 mg by mouth 3 (three) times daily.   guaiFENesin-dextromethorphan 100-10 MG/5ML syrup Commonly known as:  ROBITUSSIN DM Take 5 mLs by mouth every 4 (four) hours as needed for cough.   hydrALAZINE 100 MG tablet Commonly known as:  APRESOLINE Take 1 tablet (100 mg total) by mouth 2 (two) times daily.   ipratropium-albuterol 0.5-2.5 (3) MG/3ML Soln Commonly known as:  DUONEB Take 3 mLs by nebulization every 6 (six) hours.   isosorbide mononitrate 30 MG 24 hr tablet Commonly known as:  IMDUR   lisinopril 5 MG tablet Commonly known as:  PRINIVIL,ZESTRIL Take 5 mg by mouth daily.   multivitamin-iron-minerals-folic acid chewable tablet Chew 1 tablet by mouth daily.   nitroGLYCERIN 0.3 MG SL tablet Commonly known as:  NITROSTAT Place under the tongue.   primidone 50  MG tablet Commonly known as:  MYSOLINE Take 1 tablet (50 mg total) by mouth 2 times daily at 12 noon and 4 pm. What changed:  how much to take   propranolol ER 120 MG 24 hr capsule Commonly known as:  INDERAL LA Take 1 capsule (120 mg total) by mouth daily.   spironolactone 25 MG tablet Commonly known as:  ALDACTONE Take 1 tablet by mouth daily.   torsemide 20 MG tablet Commonly known as:  DEMADEX Take 20 mg by mouth daily.   umeclidinium-vilanterol 62.5-25 MCG/INH Aepb Commonly known as:   ANORO ELLIPTA Inhale 1 puff into the lungs daily. Start taking on:  09/04/2017        DISCHARGE INSTRUCTIONS:  See AVS.  If you experience worsening of your admission symptoms, develop shortness of breath, life threatening emergency, suicidal or homicidal thoughts you must seek medical attention immediately by calling 911 or calling your MD immediately  if symptoms less severe.  You Must read complete instructions/literature along with all the possible adverse reactions/side effects for all the Medicines you take and that have been prescribed to you. Take any new Medicines after you have completely understood and accpet all the possible adverse reactions/side effects.   Please note  You were cared for by a hospitalist during your hospital stay. If you have any questions about your discharge medications or the care you received while you were in the hospital after you are discharged, you can call the unit and asked to speak with the hospitalist on call if the hospitalist that took care of you is not available. Once you are discharged, your primary care physician will handle any further medical issues. Please note that NO REFILLS for any discharge medications will be authorized once you are discharged, as it is imperative that you return to your primary care physician (or establish a relationship with a primary care physician if you do not have one) for your aftercare needs so that they can reassess your need for medications and monitor your lab values.    On the day of Discharge:  VITAL SIGNS:  Blood pressure (!) 141/73, pulse (!) 53, temperature 98 F (36.7 C), temperature source Oral, resp. rate 15, height 5\' 9"  (1.753 m), weight 295 lb 14.4 oz (134.2 kg), SpO2 95 %. PHYSICAL EXAMINATION:  GENERAL:  82 y.o.-year-old patient lying in the bed with no acute distress.  EYES: Pupils equal, round, reactive to light and accommodation. No scleral icterus. Extraocular muscles intact.  HEENT: Head  atraumatic, normocephalic. Oropharynx and nasopharynx clear.  NECK:  Supple, no jugular venous distention. No thyroid enlargement, no tenderness.  LUNGS: Normal breath sounds bilaterally, moderate wheezing, no rales,rhonchi or crepitation. No use of accessory muscles of respiration.  CARDIOVASCULAR: S1, S2 normal. No murmurs, rubs, or gallops.  ABDOMEN: Soft, non-tender, non-distended. Bowel sounds present. No organomegaly or mass.  EXTREMITIES: No pedal edema, cyanosis, or clubbing. Right leg hematoma. NEUROLOGIC: Cranial nerves II through XII are intact. Muscle strength 4/5 in all extremities. Sensation intact. Gait not checked.  PSYCHIATRIC: The patient is alert and oriented x 3.  SKIN: No obvious rash, lesion, or ulcer.  DATA REVIEW:   CBC Recent Labs  Lab 08/31/17 0259  WBC 7.9  HGB 14.4  HCT 42.6  PLT 153    Chemistries  Recent Labs  Lab 08/31/17 0259 09/02/17 0426  NA 144 141  K 3.4* 3.7  CL 104 99*  CO2 32 31  GLUCOSE 117* 123*  BUN 39* 41*  CREATININE 1.56* 1.51*  CALCIUM 9.0 8.5*  AST 46*  --   ALT 45  --   ALKPHOS 105  --   BILITOT 0.8  --      Microbiology Results  No results found for this or any previous visit.  RADIOLOGY:  No results found.   Management plans discussed with the patient, his wife and they are in agreement.  CODE STATUS: Full Code   TOTAL TIME TAKING CARE OF THIS PATIENT: 37 minutes.    Shaune Pollack M.D on 09/03/2017 at 10:22 AM  Between 7am to 6pm - Pager - [phone]  After 6pm go to [email or web address] - Social research officer, government  [facility]  Office  [phone]  CC: Primary care physician; Dylan Shaggy, MD   Note: This dictation was prepared with Dragon dictation along with smaller phrase technology. Any transcriptional errors that result from this process are unintentional.

## 2017-09-03 NOTE — Progress Notes (Signed)
Physical Therapy Treatment Patient Details Name: Dylan [name] MRN: [id number] DOB: [birth year] Today's Date: 09/03/2017    History of Present Illness 82yo male pt presented to ER status and admitted under observation status post fall in home environment with acute onset of R LE pain/hematoma.     PT Comments    Pt is making good progress towards goals with decreased physical assist required and pt motivated to ambulate. +2 for safety with use of RW. Compensates with larger step length due to foot drop. Good endurance with there-ex. Still recommending rehab due to high fall risk and since pt is not at baseline level.   Follow Up Recommendations  SNF     Equipment Recommendations  Rolling walker with 5" wheels;3in1 (PT)    Recommendations for Other Services       Precautions / Restrictions Precautions Precautions: Fall Restrictions Weight Bearing Restrictions: No    Mobility  Bed Mobility Overal bed mobility: Needs Assistance Bed Mobility: Supine to Sit     Supine to sit: Min assist     General bed mobility comments: cues for initiation. Needs slight assist for trunkal elevation. Once seated, able to sit with supervision. Reported dizziness with sitting.  Transfers Overall transfer level: Needs assistance Equipment used: Rolling walker (2 wheeled) Transfers: Sit to/from Stand Sit to Stand: Min assist         General transfer comment: stood from elevated bed. Broad BOS. Reports initial dizziness upon standing, however disapates quickly  Ambulation/Gait Ambulation/Gait assistance: Min assist Ambulation Distance (Feet): 15 Feet Assistive device: Rolling walker (2 wheeled) Gait Pattern/deviations: Step-to pattern     General Gait Details: increased step height on R foot due to foot drop. Ambulated in room with min assist and cues for safe technique.   Stairs            Wheelchair Mobility    Modified Rankin (Stroke Patients Only)       Balance                                             Cognition Arousal/Alertness: Awake/alert Behavior During Therapy: WFL for tasks assessed/performed Overall Cognitive Status: Within Functional Limits for tasks assessed                                        Exercises Other Exercises Other Exercises: supine ther-ex performed on B LE including SLRs, hip abd/ad, SAQ, hip add squeezes, and glut squeezes. All ther-ex performed x 10 reps with min assist for correct technique    General Comments        Pertinent Vitals/Pain Pain Assessment: Faces Faces Pain Scale: Hurts little more Pain Location: L knee, R LE Pain Descriptors / Indicators: Aching;Grimacing;Guarding Pain Intervention(s): Limited activity within patient's tolerance    Home Living                      Prior Function            PT Goals (current goals can now be found in the care plan section) Acute Rehab PT Goals Patient Stated Goal: to go to rehab to get stronger PT Goal Formulation: With patient/family Time For Goal Achievement: 09/15/17 Potential to Achieve Goals: Good Progress towards PT goals: Progressing toward goals  Frequency    Min 2X/week      PT Plan Current plan remains appropriate    Co-evaluation              AM-PAC PT "6 Clicks" Daily Activity  Outcome Measure  Difficulty turning over in bed (including adjusting bedclothes, sheets and blankets)?: Unable Difficulty moving from lying on back to sitting on the side of the bed? : Unable Difficulty sitting down on and standing up from a chair with arms (e.g., wheelchair, bedside commode, etc,.)?: Unable Help needed moving to and from a bed to chair (including a wheelchair)?: A Little Help needed walking in hospital room?: A Little Help needed climbing 3-5 steps with a railing? : A Lot 6 Click Score: 11    End of Session Equipment Utilized During Treatment: Gait belt Activity Tolerance: Patient  tolerated treatment well Patient left: in chair;with chair alarm set Nurse Communication: Mobility status PT Visit Diagnosis: Muscle weakness (generalized) (M62.81);Repeated falls (R29.6);Pain;Difficulty in walking, not elsewhere classified (R26.2) Pain - Right/Left: Right Pain - part of body: Leg     Time: 0960-4540 PT Time Calculation (min) (ACUTE ONLY): 30 min  Charges:  $Gait Training: 8-22 mins $Therapeutic Exercise: 8-22 mins                    G Codes:       Elizabeth Palau, PT, DPT [phone]    Dylan [name] 09/03/2017, 3:33 PM

## 2017-09-04 LAB — CBC
HEMATOCRIT: 36.3 % — AB (ref 40.0–52.0)
HEMOGLOBIN: 12.2 g/dL — AB (ref 13.0–18.0)
MCH: 31.9 pg (ref 26.0–34.0)
MCHC: 33.7 g/dL (ref 32.0–36.0)
MCV: 94.7 fL (ref 80.0–100.0)
Platelets: 146 10*3/uL — ABNORMAL LOW (ref 150–440)
RBC: 3.83 MIL/uL — AB (ref 4.40–5.90)
RDW: 15.5 % — ABNORMAL HIGH (ref 11.5–14.5)
WBC: 8.3 10*3/uL (ref 3.8–10.6)

## 2017-09-04 MED ORDER — METHYLPREDNISOLONE SODIUM SUCC 40 MG IJ SOLR
40.0000 mg | Freq: Three times a day (TID) | INTRAMUSCULAR | Status: DC
  Administered 2017-09-04 – 2017-09-05 (×2): 40 mg via INTRAVENOUS
  Filled 2017-09-04 (×3): qty 1

## 2017-09-04 NOTE — Progress Notes (Signed)
OT Cancellation Note  Patient Details Name: Dylan [name] MRN: [id number] DOB: June 02, 1936   Cancelled Treatment:    Reason Eval/Treat Not Completed: Other (comment). Pt with RN upon attempt to treat. Will attempt at later date/time as pt is available.   Richrd PrimeJamie Stiller, MPH, MS, OTR/L ascom [phone] 09/04/17, 4:28 PM

## 2017-09-04 NOTE — Progress Notes (Signed)
Physical Therapy Treatment Patient Details Name: Dylan [name] MRN: [id number] DOB: 07-08-1936 Today's Date: 09/04/2017    History of Present Illness 81yo male pt presented to ER status and admitted under observation status post fall in home environment with acute onset of R LE pain/hematoma.     PT Comments    Pt in recliner, ready to walk.  Stood with min guard and was able to ambulate around unit this am.  PT took frequent self initiated rest breaks due to right knee instability and "sliding".  Gait remains very slow and guarded.  Continues to await arrival of knee brace.  Overall he was able to increase his gait distances but he continues to rely heavily on walker for support and fall risk remains high.  SNF remains appropriate at this time.    Follow Up Recommendations  SNF     Equipment Recommendations  Rolling walker with 5" wheels;3in1 (PT)    Recommendations for Other Services       Precautions / Restrictions Precautions Precautions: Fall Restrictions Weight Bearing Restrictions: No    Mobility  Bed Mobility               General bed mobility comments: in recliner  Transfers Overall transfer level: Needs assistance Equipment used: Rolling walker (2 wheeled) Transfers: Sit to/from Stand Sit to Stand: Min guard;Min assist            Ambulation/Gait Ambulation/Gait assistance: Min assist Ambulation Distance (Feet): 160 Feet Assistive device: Rolling walker (2 wheeled) Gait Pattern/deviations: Step-to pattern   Gait velocity interpretation: <1.8 ft/sec, indicative of risk for recurrent falls General Gait Details: gait slow and guarded, increased step height on R due to foot drop.     Stairs            Wheelchair Mobility    Modified Rankin (Stroke Patients Only)       Balance Overall balance assessment: Needs assistance Sitting-balance support: No upper extremity supported;Feet supported Sitting balance-Leahy Scale: Good      Standing balance support: Bilateral upper extremity supported Standing balance-Leahy Scale: Fair Standing balance comment: relies heavily on walker for support                            Cognition Arousal/Alertness: Awake/alert Behavior During Therapy: WFL for tasks assessed/performed Overall Cognitive Status: Within Functional Limits for tasks assessed                                        Exercises      General Comments        Pertinent Vitals/Pain Pain Assessment: 0-10 Pain Score: 4  Pain Location: L knee  Pain Descriptors / Indicators: Aching;Guarding Pain Intervention(s): Limited activity within patient's tolerance;Monitored during session    Home Living                      Prior Function            PT Goals (current goals can now be found in the care plan section) Progress towards PT goals: Progressing toward goals    Frequency    Min 2X/week      PT Plan Current plan remains appropriate    Co-evaluation              AM-PAC PT "6 Clicks" Daily Activity  Outcome Measure  Difficulty turning over in bed (including adjusting bedclothes, sheets and blankets)?: Unable Difficulty moving from lying on back to sitting on the side of the bed? : Unable Difficulty sitting down on and standing up from a chair with arms (e.g., wheelchair, bedside commode, etc,.)?: A Little Help needed moving to and from a bed to chair (including a wheelchair)?: A Little Help needed walking in hospital room?: A Little Help needed climbing 3-5 steps with a railing? : A Lot 6 Click Score: 13    End of Session Equipment Utilized During Treatment: Gait belt Activity Tolerance: Patient tolerated treatment well Patient left: in chair;with chair alarm set;with call bell/phone within reach;with family/visitor present   Pain - Right/Left: Right Pain - part of body: Leg     Time: [id number] PT Time Calculation (min) (ACUTE ONLY): 17  min  Charges:  $Gait Training: 8-22 mins                    G Codes:       Danielle DessSarah Maryem Shuffler, PTA 09/04/17, 11:17 AM

## 2017-09-04 NOTE — Progress Notes (Signed)
[facility] - [place] at [facility]   PATIENT NAME: Muneer Leider    MR#:  [id number]  DATE OF BIRTH:  [birth year]  Lots of cough, fever yesterday, he feels cough is productive.  Having knee pain, hematoma in the right leg.  Requesting sutures removed from the right ear.  CHIEF COMPLAINT:   Chief Complaint  Patient presents with  . Fall   Still cough, better shortness of breath and wheezing. REVIEW OF SYSTEMS:   ROS CONSTITUTIONAL: No fever, fatigue or weakness.  EYES: No blurred or double vision.  EARS, NOSE, AND THROAT: No tinnitus or ear pain.  Concern about stitches on the right ear. RESPIRATORY: Cough, wheezing, mild shortness of breath.Marland Kitchen  CARDIOVASCULAR: No chest pain, orthopnea, edema.  GASTROINTESTINAL: No nausea, vomiting, diarrhea or abdominal pain.  GENITOURINARY: No dysuria, hematuria.  ENDOCRINE: No polyuria, nocturia,  HEMATOLOGY: No anemia, easy bruising or bleeding SKIN: No rash or lesion. MUSCULOSKELETAL: Left knee pain, hematoma in the right leg. NEUROLOGIC: No tingling, numbness, weakness.  PSYCHIATRY: No anxiety or depression.   DRUG ALLERGIES:  No Known Allergies  VITALS:  Blood pressure (!) 151/76, pulse (!) 51, temperature 98.6 F (37 C), temperature source Oral, resp. rate 17, height 5\' 9"  (1.753 m), weight 295 lb 14.4 oz (134.2 kg), SpO2 100 %.  PHYSICAL EXAMINATION:  GENERAL:  82 y.o.-year-old patient lying in the bed with no acute distress.  EYES: Pupils equal, round, reactive to light and accommodation. No scleral icterus. Extraocular muscles intact.  HEENT: Head atraumatic, normocephalic. Oropharynx and nasopharynx clear.  NECK:  Supple, no jugular venous distention. No thyroid enlargement, no tenderness.  LUNGS:  patient has wheezing bilaterally, no rales or use of accessory muscle to breath. cARDIOVASCULAR: S1, S2 normal. No murmurs, rubs, or gallops.  ABDOMEN: Soft, nontender, nondistended. Bowel sounds present. No  organomegaly or mass.  EXTREMITIES: hematoma in the right leg, swollen left knee, chronic dermatitis in both legs. NEUROLOGIC: Cranial nerves II through XII are intact. Muscle strength 4/5 in all extremities. Sensation intact. Gait not checked.  PSYCHIATRIC: The patient is alert and oriented x 3.  SKIN: Chronic dermatitis in both legs. LABORATORY PANEL:   CBC Recent Labs  Lab 09/04/17 0442  WBC 8.3  HGB 12.2*  HCT 36.3*  PLT 146*   ------------------------------------------------------------------------------------------------------------------  Chemistries  Recent Labs  Lab 08/31/17 0259 09/02/17 0426  NA 144 141  K 3.4* 3.7  CL 104 99*  CO2 32 31  GLUCOSE 117* 123*  BUN 39* 41*  CREATININE 1.56* 1.51*  CALCIUM 9.0 8.5*  AST 46*  --   ALT 45  --   ALKPHOS 105  --   BILITOT 0.8  --    ------------------------------------------------------------------------------------------------------------------  Cardiac Enzymes No results for input(s): TROPONINI in the last 168 hours. ------------------------------------------------------------------------------------------------------------------  RADIOLOGY:  No results found.  EKG:   Orders placed or performed during the hospital encounter of 08/31/17  . EKG 12-Lead  . EKG 12-Lead    ASSESSMENT AND PLAN:  Ambulatory difficulty secondary to knee pain: Physical therapy recommended skilled nursingand by orthopedic, follow-up with Dr. Ernest Pine as an outpatient for knee pain and swelling.. #2. large hematoma on the right leg due to fall: Decreasing:continue cold compressions.ice packs, 3. Chronic dermatitis in both legs. Continue Unna boots. 4. Diastolic heart failure: Stable.on diuretics,aldactone 4. Essential hypertension: Controlled. #5 mild hypokalemia. improved. 6.. Acute on chronic renal failure:  Stable. Chronic kidney disease stage III. 7.COPD exacerbation, possible acute bronchitis. No pneumonia per chest  x-ray. He is treated with ronchodilators, antibiotics, steroids. Robitussin prn. taper iv solumedrol.  * tobacco abuse.  Smoking cessation was counseled for 3- 4 minutes. Nicotine patch. .  All the records are reviewed and case discussed with Care Management/Social Workerr. Management plans discussed with the patient, his wife and they are in agreement.  CODE STATUS: full  TOTAL TIME TAKING CARE OF THIS PATIENT: 32 minutes.   POSSIBLE D/C to SNF IN 1-2 DAYS, DEPENDING ON CLINICAL CONDITION.   Shaune PollackQing Willodene Stallings M.D on 09/04/2017 at 1:55 PM  Between 7am to 6pm - Pager - [phone]  After 6pm go to [email or web address] - password [email or web address] [facility]  [facility] [place] [facility]  Office  [phone]  CC: Primary care physician; Jaclyn Shaggyate, [name], MD   Note: This dictation was prepared with Dragon dictation along with smaller phrase technology. Any transcriptional errors that result from this process are unintentional.

## 2017-09-04 NOTE — Progress Notes (Signed)
Pt and his wife very concerned this morning about the hematoma to pt's right leg. They state that the swelling has not decreased at all since admission. Pt's legs have been elevated and ice applied continuously. Dr. Imogene Burnhen assessed on rounds and asked that RN contact Dr. Ernest PineHooten. Dr. Ernest PineHooten stated that it would take weeks for swelling to decrease and that he would assess tomorrow prior to discharge. Dr. Ernest PineHooten stated that a larger pair of knee TED hose could be applied. Pt and his wife informed.   [place], Latricia HeftKorie G

## 2017-09-05 MED ORDER — PREDNISONE 10 MG PO TABS: ORAL_TABLET | ORAL | 0 refills | Status: DC

## 2017-09-05 MED ORDER — ALUM & MAG HYDROXIDE-SIMETH 200-200-20 MG/5ML PO SUSP: 15.0000 mL | Freq: Four times a day (QID) | ORAL | 0 refills | Status: DC | PRN

## 2017-09-05 NOTE — Progress Notes (Signed)
[facility] being [facility] today via EMS. [facility] DC paperwork given to patient, IV removed and belongings packed.  NT will prepare patient for transportation after Dr. Ernest PineHooten sees patient.

## 2017-09-05 NOTE — Progress Notes (Signed)
EMS here to transport patient. VSS at time of discharge. 

## 2017-09-05 NOTE — Progress Notes (Signed)
[facility] - [place] at [facility]   PATIENT NAME: Dylan [name]    MR#:  [id number]  DATE OF BIRTH:  [birth year]  DATE OF ADMISSION:  08/31/2017   ADMITTING PHYSICIAN: Arnaldo NatalMichael S Diamond, MD  DATE OF DISCHARGE: 09/05/2017  PRIMARY CARE PHYSICIAN: Jaclyn Shaggyate, [name], MD   ADMISSION DIAGNOSIS:  [facility] - Fall DISCHARGE DIAGNOSIS:  Active Problems:   Weakness  SECONDARY DIAGNOSIS:   Past Medical History:  Diagnosis Date  . CAD (coronary artery disease)   . Cervicalgia   . CHF (congestive heart failure) (HCC)   . Diastolic heart failure (HCC)   . Foot drop, right   . Hyperlipidemia   . Hypertension   . Myocardial infarction (HCC)   . Osteoarthritis   . Shoulder pain, left   . Tremor, essential    HOSPITAL COURSE:  Ambulatory difficulty secondary to knee pain: Physical therapy recommended skilled nursing and by orthopedic, follow-up with Dr. Ernest PineHooten as an outpatient for knee pain and swelling.. #2. large hematoma on the right leg due to fall: Decreasing: continue cold compressions.ice packs, 3.  Chronic dermatitis in both legs.  Continue Unna boots. 4.  Diastolic heart failure: Stable.on diuretics,aldactone 4.  Essential hypertension: Controlled. #5 mild hypokalemia.  improved. 6..  Acute on chronic renal failure:  Stable. Chronic kidney disease stage III. 7.  COPD exacerbation, possible acute bronchitis.  No pneumonia per chest x-ray. He is treated with ronchodilators, antibiotics, steroids. Robitussin prn. Taper prednisone.  * tobacco abuse.  Smoking cessation was counseled for 3- 4 minutes. Nicotine patch. DISCHARGE CONDITIONS:  Stable, discharge to SNF today.. CONSULTS OBTAINED:  Treatment Team:  Donato HeinzHooten, James P, MD DRUG ALLERGIES:  No Known Allergies DISCHARGE MEDICATIONS:   Allergies as of 09/05/2017   No Known Allergies     Medication List    STOP taking these medications   hydrochlorothiazide 25 MG tablet Commonly known as:  HYDRODIURIL     TAKE these medications   alum & mag hydroxide-simeth 200-200-20 MG/5ML suspension Commonly known as:  MAALOX/MYLANTA Take 15 mLs by mouth every 6 (six) hours as needed for indigestion.   aspirin 81 MG tablet Take 81 mg by mouth daily.   atorvastatin 40 MG tablet Commonly known as:  LIPITOR Take 40 mg by mouth daily at 6 PM.   carvedilol 12.5 MG tablet Commonly known as:  COREG Take 12.5 mg by mouth 2 (two) times daily.   clopidogrel 75 MG tablet Commonly known as:  PLAVIX Take 1 tablet (75 mg total) by mouth daily.   docusate sodium 100 MG capsule Commonly known as:  COLACE Take 200 mg by mouth at bedtime.   gabapentin 300 MG capsule Commonly known as:  NEURONTIN Take 300 mg by mouth 3 (three) times daily.   guaiFENesin-dextromethorphan 100-10 MG/5ML syrup Commonly known as:  ROBITUSSIN DM Take 5 mLs by mouth every 4 (four) hours as needed for cough.   hydrALAZINE 100 MG tablet Commonly known as:  APRESOLINE Take 1 tablet (100 mg total) by mouth 2 (two) times daily.   ipratropium-albuterol 0.5-2.5 (3) MG/3ML Soln Commonly known as:  DUONEB Take 3 mLs by nebulization every 6 (six) hours.   isosorbide mononitrate 30 MG 24 hr tablet Commonly known as:  IMDUR   lisinopril 5 MG tablet Commonly known as:  PRINIVIL,ZESTRIL Take 5 mg by mouth daily.   multivitamin-iron-minerals-folic acid chewable tablet Chew 1 tablet by mouth daily.   nitroGLYCERIN 0.3 MG SL tablet Commonly known as:  NITROSTAT  Place under the tongue.   predniSONE 10 MG tablet Commonly known as:  DELTASONE 40 mg po daily 1 day, 30 mg po daily 1 day, 20 mg po daily 1 day, 10 mg po daily 1 day.   primidone 50 MG tablet Commonly known as:  MYSOLINE Take 1 tablet (50 mg total) by mouth 2 times daily at 12 noon and 4 pm. What changed:  how much to take   propranolol ER 120 MG 24 hr capsule Commonly known as:  INDERAL LA Take 1 capsule (120 mg total) by mouth daily.   spironolactone 25 MG  tablet Commonly known as:  ALDACTONE Take 1 tablet by mouth daily.   torsemide 20 MG tablet Commonly known as:  DEMADEX Take 20 mg by mouth daily.   umeclidinium-vilanterol 62.5-25 MCG/INH Aepb Commonly known as:  ANORO ELLIPTA Inhale 1 puff into the lungs daily.        DISCHARGE INSTRUCTIONS:  See AVS.  If you experience worsening of your admission symptoms, develop shortness of breath, life threatening emergency, suicidal or homicidal thoughts you must seek medical attention immediately by calling 911 or calling your MD immediately  if symptoms less severe.  You Must read complete instructions/literature along with all the possible adverse reactions/side effects for all the Medicines you take and that have been prescribed to you. Take any new Medicines after you have completely understood and accpet all the possible adverse reactions/side effects.   Please note  You were cared for by a hospitalist during your hospital stay. If you have any questions about your discharge medications or the care you received while you were in the hospital after you are discharged, you can call the unit and asked to speak with the hospitalist on call if the hospitalist that took care of you is not available. Once you are discharged, your primary care physician will handle any further medical issues. Please note that NO REFILLS for any discharge medications will be authorized once you are discharged, as it is imperative that you return to your primary care physician (or establish a relationship with a primary care physician if you do not have one) for your aftercare needs so that they can reassess your need for medications and monitor your lab values.    On the day of Discharge:  VITAL SIGNS:  Blood pressure 136/66, pulse (!) 51, temperature 97.7 F (36.5 C), temperature source Oral, resp. rate 19, height 5\' 9"  (1.753 m), weight (!) 300 lb 11.3 oz (136.4 kg), SpO2 99 %. PHYSICAL EXAMINATION:  GENERAL:   82 y.o.-year-old patient lying in the bed with no acute distress.  EYES: Pupils equal, round, reactive to light and accommodation. No scleral icterus. Extraocular muscles intact.  HEENT: Head atraumatic, normocephalic. Oropharynx and nasopharynx clear.  NECK:  Supple, no jugular venous distention. No thyroid enlargement, no tenderness.  LUNGS: Normal breath sounds bilaterally, mild wheezing, no rales,rhonchi or crepitation. No use of accessory muscles of respiration.  CARDIOVASCULAR: S1, S2 normal. No murmurs, rubs, or gallops.  ABDOMEN: Soft, non-tender, non-distended. Bowel sounds present. No organomegaly or mass.  EXTREMITIES: No pedal edema, cyanosis, or clubbing. Right leg hematoma. NEUROLOGIC: Cranial nerves II through XII are intact. Muscle strength 4/5 in all extremities. Sensation intact. Gait not checked.  PSYCHIATRIC: The patient is alert and oriented x 3.  SKIN: No obvious rash, lesion, or ulcer.  DATA REVIEW:   CBC Recent Labs  Lab 09/04/17 0442  WBC 8.3  HGB 12.2*  HCT 36.3*  PLT 146*  Chemistries  Recent Labs  Lab 08/31/17 0259 09/02/17 0426  NA 144 141  K 3.4* 3.7  CL 104 99*  CO2 32 31  GLUCOSE 117* 123*  BUN 39* 41*  CREATININE 1.56* 1.51*  CALCIUM 9.0 8.5*  AST 46*  --   ALT 45  --   ALKPHOS 105  --   BILITOT 0.8  --      Microbiology Results  No results found for this or any previous visit.  RADIOLOGY:  No results found.   Management plans discussed with the patient, his wife and they are in agreement.  CODE STATUS: Full Code   TOTAL TIME TAKING CARE OF THIS PATIENT: 35 minutes.    Shaune Pollack M.D on 09/05/2017 at 9:04 AM  Between 7am to 6pm - Pager - [phone]  After 6pm go to [email or web address] - Social research officer, government  [facility] [place] [facility]  Office  [phone]  CC: Primary care physician; [name], MD   Note: This dictation was prepared with Dragon dictation along with smaller phrase technology. Any  transcriptional errors that result from this process are unintentional.

## 2017-09-05 NOTE — Progress Notes (Signed)
EMS called

## 2017-09-05 NOTE — Discharge Instructions (Signed)
Fall precaution. Smoking cessation.

## 2017-09-05 NOTE — Progress Notes (Signed)
Patient is medically stable for [facility]. [facility] authorization has been received and patient can come today to room 210-A. RN will call report at [phone] and arrange EMS for transport. Clinical Child psychotherapistocial Worker (CSW) sent [facility] [employer]. Patient is aware of above. Patient's wife Eber JonesCarolyn is at bedside and aware of above. Please reconsult if future social work needs arise. CSW signing off.   [facility] Hughes IncorporatedBailey Keiyana Stehr, LCSW [phone]

## 2017-09-05 NOTE — Discharge Summary (Signed)
[facility] -  at [facility]   PATIENT NAME: Dylan [name]    MR#:  [id number]  DATE OF BIRTH:  23-Aug-1936  DATE OF ADMISSION:  08/31/2017   ADMITTING PHYSICIAN: Arnaldo NatalMichael S Diamond, MD  DATE OF DISCHARGE: 09/05/2017  PRIMARY CARE PHYSICIAN: Jaclyn Shaggyate, [name], MD   ADMISSION DIAGNOSIS:  [facility] - Fall DISCHARGE DIAGNOSIS:  Active Problems:   Weakness  SECONDARY DIAGNOSIS:   Past Medical History:  Diagnosis Date  . CAD (coronary artery disease)   . Cervicalgia   . CHF (congestive heart failure) (HCC)   . Diastolic heart failure (HCC)   . Foot drop, right   . Hyperlipidemia   . Hypertension   . Myocardial infarction (HCC)   . Osteoarthritis   . Shoulder pain, left   . Tremor, essential    HOSPITAL COURSE:  Ambulatory difficulty secondary to knee pain: Physical therapy recommended skilled nursing and by orthopedic, follow-up with Dr. Ernest PineHooten as an outpatient for knee pain and swelling.. #2. large hematoma on the right leg due to fall: Decreasing: continue cold compressions.ice packs, 3.  Chronic dermatitis in both legs.  Continue Unna boots. 4.  Diastolic heart failure: Stable.on diuretics,aldactone 4.  Essential hypertension: Controlled. #5 mild hypokalemia.  improved. 6..  Acute on chronic renal failure:  Stable. Chronic kidney disease stage III. 7.  COPD exacerbation, possible acute bronchitis.  No pneumonia per chest x-ray. He is treated with ronchodilators, antibiotics, steroids. Robitussin prn. Taper prednisone.  * tobacco abuse.  Smoking cessation was counseled for 3- 4 minutes. Nicotine patch. DISCHARGE CONDITIONS:  Stable, discharge to SNF today.. CONSULTS OBTAINED:  Treatment Team:  Donato HeinzHooten, James P, MD DRUG ALLERGIES:  No Known Allergies DISCHARGE MEDICATIONS:   Allergies as of 09/05/2017   No Known Allergies     Medication List    STOP taking these medications   hydrochlorothiazide 25 MG tablet Commonly known as:  HYDRODIURIL     TAKE these medications   alum & mag hydroxide-simeth 200-200-20 MG/5ML suspension Commonly known as:  MAALOX/MYLANTA Take 15 mLs by mouth every 6 (six) hours as needed for indigestion.   aspirin 81 MG tablet Take 81 mg by mouth daily.   atorvastatin 40 MG tablet Commonly known as:  LIPITOR Take 40 mg by mouth daily at 6 PM.   carvedilol 12.5 MG tablet Commonly known as:  COREG Take 12.5 mg by mouth 2 (two) times daily.   clopidogrel 75 MG tablet Commonly known as:  PLAVIX Take 1 tablet (75 mg total) by mouth daily.   docusate sodium 100 MG capsule Commonly known as:  COLACE Take 200 mg by mouth at bedtime.   gabapentin 300 MG capsule Commonly known as:  NEURONTIN Take 300 mg by mouth 3 (three) times daily.   guaiFENesin-dextromethorphan 100-10 MG/5ML syrup Commonly known as:  ROBITUSSIN DM Take 5 mLs by mouth every 4 (four) hours as needed for cough.   hydrALAZINE 100 MG tablet Commonly known as:  APRESOLINE Take 1 tablet (100 mg total) by mouth 2 (two) times daily.   ipratropium-albuterol 0.5-2.5 (3) MG/3ML Soln Commonly known as:  DUONEB Take 3 mLs by nebulization every 6 (six) hours.   isosorbide mononitrate 30 MG 24 hr tablet Commonly known as:  IMDUR   lisinopril 5 MG tablet Commonly known as:  PRINIVIL,ZESTRIL Take 5 mg by mouth daily.   multivitamin-iron-minerals-folic acid chewable tablet Chew 1 tablet by mouth daily.   nitroGLYCERIN 0.3 MG SL tablet Commonly known as:  NITROSTAT  Place under the tongue.   predniSONE 10 MG tablet Commonly known as:  DELTASONE 40 mg po daily 1 day, 30 mg po daily 1 day, 20 mg po daily 1 day, 10 mg po daily 1 day.   primidone 50 MG tablet Commonly known as:  MYSOLINE Take 1 tablet (50 mg total) by mouth 2 times daily at 12 noon and 4 pm. What changed:  how much to take   propranolol ER 120 MG 24 hr capsule Commonly known as:  INDERAL LA Take 1 capsule (120 mg total) by mouth daily.   spironolactone 25 MG  tablet Commonly known as:  ALDACTONE Take 1 tablet by mouth daily.   torsemide 20 MG tablet Commonly known as:  DEMADEX Take 20 mg by mouth daily.   umeclidinium-vilanterol 62.5-25 MCG/INH Aepb Commonly known as:  ANORO ELLIPTA Inhale 1 puff into the lungs daily.        DISCHARGE INSTRUCTIONS:  See AVS.  If you experience worsening of your admission symptoms, develop shortness of breath, life threatening emergency, suicidal or homicidal thoughts you must seek medical attention immediately by calling 911 or calling your MD immediately  if symptoms less severe.  You Must read complete instructions/literature along with all the possible adverse reactions/side effects for all the Medicines you take and that have been prescribed to you. Take any new Medicines after you have completely understood and accpet all the possible adverse reactions/side effects.   Please note  You were cared for by a hospitalist during your hospital stay. If you have any questions about your discharge medications or the care you received while you were in the hospital after you are discharged, you can call the unit and asked to speak with the hospitalist on call if the hospitalist that took care of you is not available. Once you are discharged, your primary care physician will handle any further medical issues. Please note that NO REFILLS for any discharge medications will be authorized once you are discharged, as it is imperative that you return to your primary care physician (or establish a relationship with a primary care physician if you do not have one) for your aftercare needs so that they can reassess your need for medications and monitor your lab values.    On the day of Discharge:  VITAL SIGNS:  Blood pressure 136/66, pulse (!) 51, temperature 97.7 F (36.5 C), temperature source Oral, resp. rate 19, height 5\' 9"  (1.753 m), weight (!) 300 lb 11.3 oz (136.4 kg), SpO2 99 %. PHYSICAL EXAMINATION:  GENERAL:   82 y.o.-year-old patient lying in the bed with no acute distress.  EYES: Pupils equal, round, reactive to light and accommodation. No scleral icterus. Extraocular muscles intact.  HEENT: Head atraumatic, normocephalic. Oropharynx and nasopharynx clear.  NECK:  Supple, no jugular venous distention. No thyroid enlargement, no tenderness.  LUNGS: Normal breath sounds bilaterally, mild wheezing, no rales,rhonchi or crepitation. No use of accessory muscles of respiration.  CARDIOVASCULAR: S1, S2 normal. No murmurs, rubs, or gallops.  ABDOMEN: Soft, non-tender, non-distended. Bowel sounds present. No organomegaly or mass.  EXTREMITIES: No pedal edema, cyanosis, or clubbing. Right leg hematoma. NEUROLOGIC: Cranial nerves II through XII are intact. Muscle strength 4/5 in all extremities. Sensation intact. Gait not checked.  PSYCHIATRIC: The patient is alert and oriented x 3.  SKIN: No obvious rash, lesion, or ulcer.  DATA REVIEW:   CBC Recent Labs  Lab 09/04/17 0442  WBC 8.3  HGB 12.2*  HCT 36.3*  PLT 146*  Chemistries  Recent Labs  Lab 08/31/17 0259 09/02/17 0426  NA 144 141  K 3.4* 3.7  CL 104 99*  CO2 32 31  GLUCOSE 117* 123*  BUN 39* 41*  CREATININE 1.56* 1.51*  CALCIUM 9.0 8.5*  AST 46*  --   ALT 45  --   ALKPHOS 105  --   BILITOT 0.8  --      Microbiology Results  No results found for this or any previous visit.  RADIOLOGY:  No results found.   Management plans discussed with the patient, his wife and they are in agreement.  CODE STATUS: Full Code   TOTAL TIME TAKING CARE OF THIS PATIENT: 35 minutes.    Shaune Pollack M.D on 09/05/2017 at 9:26 AM  Between 7am to 6pm - Pager - [phone]  After 6pm go to [email or web address] - Social research officer, government  [facility] [place] [facility]  Office  [phone]  CC: Primary care physician; [name], MD   Note: This dictation was prepared with Dragon dictation along with smaller phrase technology. Any  transcriptional errors that result from this process are unintentional.

## 2017-09-05 NOTE — Consult Note (Signed)
Orthopaedics: Patient up in a chair. Making progress daily with PT. Measured for a left knee brace as per [employer].  Hematoma to lateral right lower leg soft. Skin intact. Moderate ecchymosis.  Ace wrap applied. Continue with ice, elevation, and light compression. Follow-up as previously scheduled.  James P. Angie FavaHooten, Jr. M.D.

## 2017-09-05 NOTE — Clinical Social Work Placement (Signed)
   CLINICAL SOCIAL WORK PLACEMENT  NOTE  Date:  09/05/2017  Patient Details  Name: Dylan [name] MRN: [id number] Date of Birth: 1935/12/30  Clinical Social Work is seeking post-discharge placement for this patient at the Skilled  Nursing Facility level of care (*CSW will initial, date and re-position this form in  chart as items are completed):  Yes   Patient/family provided with [facility] Clinical Social Work Department's list of facilities offering this level of care within the geographic area requested by the patient (or if unable, by the patient's family).  Yes   Patient/family informed of their freedom to choose among providers that offer the needed level of care, that participate in Medicare, Medicaid or managed care program needed by the patient, have an available bed and are willing to accept the patient.  Yes   Patient/family informed of [facility]'s ownership interest [facility] and [facility], as well as of the fact that they are under no obligation to receive care at these facilities.  PASRR submitted to EDS on 09/01/17     PASRR number received on 09/01/17     Existing PASRR number confirmed on       FL2 transmitted to all facilities in geographic area requested by pt/family on 09/01/17     FL2 transmitted to all facilities within larger geographic area on       Patient informed that his/her managed care company has contracts with or will negotiate with certain facilities, including the following:        Yes   Patient/family informed of bed offers received.  Patient chooses bed at [facility] )     Physician recommends and patient chooses bed at      Patient to be transferred to [facility] ) on 09/05/17.  Patient to be [facility]     Patient family notified on 09/05/17 of transfer.  Name of family member notified:  (Patient's wife Dylan [name] is at bedside and aware of D/C today. )     PHYSICIAN        Additional Comment:    _______________________________________________ Bobetta Korf, Darleen CrockerBailey M, LCSW 09/05/2017, 9:59 AM

## 2017-09-08 ENCOUNTER — Other Ambulatory Visit
Admission: RE | Admit: 2017-09-08 | Discharge: 2017-09-08 | Disposition: A | Discharge status: Home / Self Care | Payer: Medicare Other | Source: Ambulatory Visit | Attending: Gerontology | Admitting: Gerontology

## 2017-09-08 DIAGNOSIS — R531 Weakness: Secondary | ICD-10-CM | POA: Diagnosis present

## 2017-09-08 LAB — CBC WITH DIFFERENTIAL/PLATELET
BASOS ABS: 0.1 10*3/uL (ref 0–0.1)
Basophils Relative: 1 %
Eosinophils Absolute: 0.4 10*3/uL (ref 0–0.7)
Eosinophils Relative: 3 %
HCT: 40.6 % (ref 40.0–52.0)
Hemoglobin: 13.5 g/dL (ref 13.0–18.0)
LYMPHS ABS: 2.2 10*3/uL (ref 1.0–3.6)
Lymphocytes Relative: 19 %
MCH: 31.5 pg (ref 26.0–34.0)
MCHC: 33.2 g/dL (ref 32.0–36.0)
MCV: 95 fL (ref 80.0–100.0)
MONOS PCT: 9 %
Monocytes Absolute: 1.1 10*3/uL — ABNORMAL HIGH (ref 0.2–1.0)
NEUTROS PCT: 68 %
Neutro Abs: 8 10*3/uL — ABNORMAL HIGH (ref 1.4–6.5)
Platelets: 208 10*3/uL (ref 150–440)
RBC: 4.28 MIL/uL — AB (ref 4.40–5.90)
RDW: 15.7 % — AB (ref 11.5–14.5)
WBC: 11.8 10*3/uL — AB (ref 3.8–10.6)

## 2017-09-08 LAB — COMPREHENSIVE METABOLIC PANEL
ALBUMIN: 2.9 g/dL — AB (ref 3.5–5.0)
ALK PHOS: 110 U/L (ref 38–126)
ALT: 96 U/L — ABNORMAL HIGH (ref 17–63)
ANION GAP: 12 (ref 5–15)
AST: 61 U/L — ABNORMAL HIGH (ref 15–41)
BUN: 47 mg/dL — ABNORMAL HIGH (ref 6–20)
CALCIUM: 8.2 mg/dL — AB (ref 8.9–10.3)
CO2: 24 mmol/L (ref 22–32)
Chloride: 104 mmol/L (ref 101–111)
Creatinine, Ser: 1.45 mg/dL — ABNORMAL HIGH (ref 0.61–1.24)
GFR calc non Af Amer: 44 mL/min — ABNORMAL LOW (ref >60)
GFR, EST AFRICAN AMERICAN: 51 mL/min — AB (ref >60)
GLUCOSE: 119 mg/dL — AB (ref 65–99)
POTASSIUM: 3.8 mmol/L (ref 3.5–5.1)
SODIUM: 140 mmol/L (ref 135–145)
Total Bilirubin: 0.9 mg/dL (ref 0.3–1.2)
Total Protein: 5.9 g/dL — ABNORMAL LOW (ref 6.5–8.1)

## 2017-09-08 LAB — LIPID PANEL
CHOL/HDL RATIO: 2.9 ratio
CHOLESTEROL: 100 mg/dL (ref 0–200)
HDL: 34 mg/dL — ABNORMAL LOW (ref >40)
LDL CALC: 54 mg/dL (ref 0–99)
Triglycerides: 61 mg/dL (ref <150)
VLDL: 12 mg/dL (ref 0–40)

## 2017-09-08 LAB — VITAMIN B12: Vitamin B-12: 699 pg/mL (ref 180–914)

## 2017-09-08 LAB — C-REACTIVE PROTEIN: CRP: 5.3 mg/dL — AB (ref <1.0)

## 2017-09-08 LAB — MAGNESIUM: Magnesium: 2.1 mg/dL (ref 1.7–2.4)

## 2017-09-08 LAB — SEDIMENTATION RATE: SED RATE: 45 mm/h — AB (ref 0–20)

## 2017-09-09 LAB — VITAMIN D 25 HYDROXY (VIT D DEFICIENCY, FRACTURES): Vit D, 25-Hydroxy: 35.3 ng/mL (ref 30.0–100.0)

## 2017-09-10 NOTE — ED Provider Notes (Signed)
[facility] Emergency Department Provider Note    First MD Initiated Contact with Patient 08/31/17 [phone]     (approximate)  I have reviewed the triage vital signs and the nursing notes.   HISTORY  Chief Complaint Fall    HPI Dylan [name] is a 82 y.o. male with below list of chronic medical conditions including CAD and diastolic heart failure presents to the emergency department after fall.  Patient states that he fell secondary to bilateral lower extremity weakness.  Patient states that he has attempted to walk with a walker but is unable to support himself when doing so.  Patient states that he has noted progressive weakness over the past 2 days rendering him unable to walk.  Patient admits to 7 out of 10 right lower extremity pain secondary to the fall tonight.  Patient denies any head injury.  Patient also admits to preceding nonproductive cough and dyspnea.  Patient denies any fever   Past Medical History:  Diagnosis Date  . CAD (coronary artery disease)   . Cervicalgia   . CHF (congestive heart failure) (HCC)   . Diastolic heart failure (HCC)   . Foot drop, right   . Hyperlipidemia   . Hypertension   . Myocardial infarction (HCC)   . Osteoarthritis   . Shoulder pain, left   . Tremor, essential     Patient Active Problem List   Diagnosis Date Noted  . Weakness 08/31/2017  . Varicose veins of lower extremities with ulcer (HCC) 06/17/2016  . Chronic venous insufficiency 06/17/2016  . Lymphedema 06/17/2016  . Acute on chronic diastolic CHF (congestive heart failure) (HCC) 06/10/2016  . CHF (congestive heart failure) (HCC) 12/15/2015    Past Surgical History:  Procedure Laterality Date  . BACK SURGERY    . CARDIAC CATHETERIZATION Left 04/30/2016   Procedure: Left Heart Cath and Coronary Angiography;  Surgeon: Laurier Nancy, MD;  Location: [facility] INVASIVE CV LAB;  Service: Cardiovascular;  Laterality: Left;  . CORONARY ANGIOPLASTY    . KNEE  SURGERY      Prior to Admission medications   Medication Sig Start Date End Date Taking? Authorizing Provider  aspirin 81 MG tablet Take 81 mg by mouth daily.   Yes [provider]  atorvastatin (LIPITOR) 40 MG tablet Take 40 mg by mouth daily at 6 PM.   Yes [provider]  carvedilol (COREG) 12.5 MG tablet Take 12.5 mg by mouth 2 (two) times daily.   Yes [provider]  docusate sodium (COLACE) 100 MG capsule Take 200 mg by mouth at bedtime.   Yes [provider]  gabapentin (NEURONTIN) 300 MG capsule Take 300 mg by mouth 3 (three) times daily.    Yes [provider]  isosorbide mononitrate (IMDUR) 30 MG 24 hr tablet  05/31/16  Yes [provider]  lisinopril (PRINIVIL,ZESTRIL) 5 MG tablet Take 5 mg by mouth daily.   Yes [provider]  multivitamin-iron-minerals-folic acid (CENTRUM) chewable tablet Chew 1 tablet by mouth daily.   Yes [provider]  nitroGLYCERIN (NITROSTAT) 0.3 MG SL tablet Place under the tongue. 02/09/16 08/31/17 Yes [provider]  spironolactone (ALDACTONE) 25 MG tablet Take 1 tablet by mouth daily.    Yes [provider]  torsemide (DEMADEX) 20 MG tablet Take 20 mg by mouth daily.    Yes [provider]  alum & mag hydroxide-simeth (MAALOX/MYLANTA) 200-200-20 MG/5ML suspension Take 15 mLs by mouth every 6 (six) hours as needed  for indigestion. 09/05/17   Shaune Pollackhen, Qing, MD  clopidogrel (PLAVIX) 75 MG tablet Take 1 tablet (75 mg total) by mouth daily. 09/03/17   Shaune Pollackhen, Qing, MD  guaiFENesin-dextromethorphan [facility](ROBITUSSIN DM) 100-10 MG/5ML syrup Take 5 mLs by mouth every 4 (four) hours as needed for cough. 09/03/17   Shaune Pollackhen, Qing, MD  hydrALAZINE (APRESOLINE) 100 MG tablet Take 1 tablet (100 mg total) by mouth 2 (two) times daily. 09/03/17   Shaune Pollackhen, Qing, MD  ipratropium-albuterol (DUONEB) 0.5-2.5 (3) MG/3ML SOLN Take 3 mLs by nebulization every 6 (six) hours. 09/03/17   Shaune Pollackhen, Qing, MD    predniSONE (DELTASONE) 10 MG tablet 40 mg po daily 1 day, 30 mg po daily 1 day, 20 mg po daily 1 day, 10 mg po daily 1 day. 09/05/17   Shaune Pollackhen, Qing, MD  primidone (MYSOLINE) 50 MG tablet Take 1 tablet (50 mg total) by mouth 2 times daily at 12 noon and 4 pm. 09/03/17   Shaune Pollackhen, Qing, MD  propranolol ER (INDERAL LA) 120 MG 24 hr capsule Take 1 capsule (120 mg total) by mouth daily. 09/03/17   Shaune Pollackhen, Qing, MD  umeclidinium-vilanterol [facility](ANORO ELLIPTA) 62.5-25 MCG/INH AEPB Inhale 1 puff into the lungs daily. 09/04/17   Shaune Pollackhen, Qing, MD    Allergies No known drug allergies  Family History  Problem Relation Age of Onset  . Diabetes Son   . Cancer Mother     Social History Social History   Tobacco Use  . Smoking status: Former Smoker    Packs/day: 0.25    Years: 58.00    Pack years: 14.50    Types: Cigarettes  . Smokeless tobacco: Never Used  Substance Use Topics  . Alcohol use: No    Alcohol/week: 0.0 oz  . Drug use: No    Review of Systems Constitutional: No fever/chills Eyes: No visual changes. ENT: No sore throat. Cardiovascular: Denies chest pain. Respiratory: Denies shortness of breath. Gastrointestinal: No abdominal pain.  No nausea, no vomiting.  No diarrhea.  No constipation. Genitourinary: Negative for dysuria. Musculoskeletal: Negative for neck pain.  Negative for back pain.  Positive for right leg pain Integumentary: Negative for rash. Neurological: Negative for headaches, focal weakness or numbness.   ____________________________________________   PHYSICAL EXAM:  VITAL SIGNS: ED Triage Vitals  Enc Vitals Group     BP 08/31/17 0257 124/67     Pulse Rate 08/31/17 0253 70     Resp 08/31/17 0253 (!) 22     Temp 08/31/17 0253 99.4 F (37.4 C)     Temp Source 08/31/17 0253 Oral     SpO2 08/31/17 0253 96 %     Weight 08/31/17 0256 136.1 kg (300 lb)     Height 08/31/17 0256 1.753 m (5\' 9" )     Head Circumference --      Peak Flow --      Pain Score 08/31/17 0252 4      Pain Loc --      Pain Edu? --      Excl. in GC? --     Constitutional: Alert and oriented. Well appearing and in no acute distress. Eyes: Conjunctivae are normal.  Head: Atraumatic. Mouth/Throat: Mucous membranes are moist.  Oropharynx non-erythematous. Neck: No stridor.  No cervical spine tenderness to palpation. Cardiovascular: Normal rate, regular rhythm. Good peripheral circulation. Grossly normal heart sounds. Respiratory: Normal respiratory effort.  No retractions. Lungs CTAB. Gastrointestinal: Soft and nontender. No distention.  Musculoskeletal: Large area of ecchymoses (hematoma) noted to the right lower  extremity  neurologic:  Normal speech and language. No gross focal neurologic deficits are appreciated.  Skin:  Skin is warm, dry and intact. No rash noted.  Large hematoma right lower extremity Psychiatric: Mood and affect are normal. Speech and behavior are normal.  ____________________________________________   LABS (all labs ordered are listed, but only abnormal results are displayed)  Labs Reviewed  CBC - Abnormal; Notable for the following components:      Result Value   RDW 15.3 (*)    All other components within normal limits  COMPREHENSIVE METABOLIC PANEL - Abnormal; Notable for the following components:   Potassium 3.4 (*)    Glucose, Bld 117 (*)    BUN 39 (*)    Creatinine, Ser 1.56 (*)    AST 46 (*)    GFR calc non Af Amer 41 (*)    GFR calc Af Amer 47 (*)    All other components within normal limits  URINALYSIS, COMPLETE (UACMP) WITH MICROSCOPIC - Abnormal; Notable for the following components:   Color, Urine YELLOW (*)    APPearance CLEAR (*)    Protein, ur 30 (*)    All other components within normal limits  BASIC METABOLIC PANEL - Abnormal; Notable for the following components:   Chloride 99 (*)    Glucose, Bld 123 (*)    BUN 41 (*)    Creatinine, Ser 1.51 (*)    Calcium 8.5 (*)    GFR calc non Af Amer 42 (*)    GFR calc Af Amer 49 (*)    All  other components within normal limits  CBC - Abnormal; Notable for the following components:   RBC 3.83 (*)    Hemoglobin 12.2 (*)    HCT 36.3 (*)    RDW 15.5 (*)    Platelets 146 (*)    All other components within normal limits  TSH  INFLUENZA PANEL BY PCR (TYPE A & B)   ____________________________________________  EKG  ED ECG REPORT I, [place] Tyree Vandruff, the attending physician, personally viewed and interpreted this ECG.   Date: 09/10/2017  EKG Time: 2:58 AM  Rate: 72  Rhythm: Normal sinus rhythm  Axis: Normal  Intervals: Normal  ST&T Change: None  ____________________________________________  RADIOLOGY I, [place] Bayley Hurn, personally viewed and evaluated these images (plain radiographs) as part of my medical decision making, as well as reviewing the written report by the radiologist.  CLINICAL DATA:  82 year old male with cough.  EXAM: CHEST  2 VIEW  COMPARISON:  Chest radiograph dated 06/10/2016  FINDINGS: Mild bibasilar atelectatic changes. There is diffuse interstitial coarsening. No focal consolidation, pleural effusion, or pneumothorax. Mild cardiomegaly. No acute osseous pathology.  IMPRESSION: 1. No acute cardiopulmonary process. 2. Mild cardiomegaly and chronic interstitial coarsening.   Electronically Signed   By: Elgie Collard M.D.   On: 08/31/2017 03:50  CLINICAL DATA:  82 year old male with fall and right lower extremity pain.  EXAM: RIGHT TIBIA AND FIBULA - 2 VIEW  COMPARISON:  Right femur radiograph dated 06/04/2010  FINDINGS: There is no acute fracture or dislocation. Mild osteopenia. Stable sclerotic changes of the distal femur most consistent with an enchondroma. There is mild arthritic changes of the knee with meniscal calcinosis. Focal soft tissue hematoma noted in the anterolateral soft tissues of the mid calf. Small calcifications of the skin likely related to chronic venous stasis.  IMPRESSION: 1. No acute  fracture or dislocation. 2. Nonacute findings as described above.   Electronically Signed  By: Elgie Collard M.D.   On: 08/31/2017 03:53 __________________________ Procedures   ____________________________________________   INITIAL IMPRESSION / ASSESSMENT AND PLAN / ED COURSE  As part of my medical decision making, I reviewed the following data within the electronic MEDICAL RECORD [id number] year old male presenting with above-stated history of progressive weakness with inability to ambulate.  X-ray of the right lower extremity revealed no evidence of fracture or dislocation likewise chest x-ray unremarkable.  Patient unable to ambulate while in the emergency department and patient's elderly wife states inability to care for the patient at home.  As such patient discussed with Dr. Sheryle Hail for hospital admission for further management ____________________________________________  FINAL CLINICAL IMPRESSION(S) / ED DIAGNOSES  Final diagnoses:  Knee pain, left  Cough     MEDICATIONS GIVEN DURING THIS VISIT:  Medications  azithromycin (ZITHROMAX) tablet 500 mg (500 mg Oral Given 09/02/17 0953)     ED Discharge Orders        Ordered    alum & mag hydroxide-simeth (MAALOX/MYLANTA) 200-200-20 MG/5ML suspension  Every 6 hours PRN     09/05/17 0849    predniSONE (DELTASONE) 10 MG tablet     09/05/17 0849    Increase activity slowly     09/05/17 0849    Diet - low sodium heart healthy     09/05/17 0849    umeclidinium-vilanterol (ANORO ELLIPTA) 62.5-25 MCG/INH AEPB  Daily     09/03/17 1018    guaiFENesin-dextromethorphan (ROBITUSSIN DM) 100-10 MG/5ML syrup  Every 4 hours PRN     09/03/17 1014    primidone (MYSOLINE) 50 MG tablet  2 times daily     09/03/17 1014    clopidogrel (PLAVIX) 75 MG tablet  Daily     09/03/17 1018    hydrALAZINE (APRESOLINE) 100 MG tablet  2 times daily     09/03/17 1018    propranolol ER (INDERAL LA) 120 MG 24 hr capsule  Daily     09/03/17 1018     ipratropium-albuterol (DUONEB) 0.5-2.5 (3) MG/3ML SOLN  Every 6 hours     09/03/17 1018    Increase activity slowly     09/03/17 1021    Diet - low sodium heart healthy     09/03/17 1021       Note:  This document was prepared using Dragon voice recognition software and may include unintentional dictation errors.    Darci Current, MD 09/10/17 2248

## 2017-09-12 ENCOUNTER — Encounter: Payer: Self-pay | Admitting: Gerontology

## 2017-09-12 ENCOUNTER — Non-Acute Institutional Stay (SKILLED_NURSING_FACILITY): Payer: Medicare Other | Admitting: Gerontology

## 2017-09-12 DIAGNOSIS — T148XXA Other injury of unspecified body region, initial encounter: Secondary | ICD-10-CM

## 2017-09-12 DIAGNOSIS — S8011XD Contusion of right lower leg, subsequent encounter: Secondary | ICD-10-CM

## 2017-09-12 NOTE — Progress Notes (Signed)
Location:   [facility] Room Number: 210A Place of Service:  SNF [phone]) Provider:  Lorenso Quarry, NP-C  [name], MD  Patient Care Team: [name], MD as PCP - General (Internal Medicine)  Extended Emergency Contact Information Primary Emergency Contact: Clute,[name] Address: [address]          [place], [place] [address] D[place] of [place] Home Phone: [phone] Mobile Phone: [phone] Relation: Spouse Secondary Emergency Contact: Donavan Foil of [place] Home Phone: [phone] Mobile Phone: [phone] Relation: Son  Code Status: FULL Goals of care: Advanced Directive information Advanced Directives 09/12/2017  Does Patient Have a Medical Advance Directive? Yes  Type of Advance Directive Healthcare Power of Attorney  Does patient want to make changes to medical advance directive? No - Patient declined  Copy of Healthcare Power of Attorney in Chart? Yes  Would patient like information on creating a medical advance directive? -     Chief Complaint  Patient presents with  . Acute Visit    Recheck hematoma    HPI:  Pt is a 82 y.o. [name] seen today for an acute visit for recheck of the hematoma on the right lateral tibia.  On initial evaluation, there is a large, bulbous hematoma approximately 10 cm in diameter on the proximal, lateral right tibia.  Initially, there was a serous filled pocket that was flat at the apex of the hematoma. Surrounding tissue was red, bruised, warm to touch and very tender to touch. Lower leg/tibia extending to the ankle was also red, warm, edematous cellulitis appearing. 3+ pitting edema of the foot. Pt was started on IV Clindamycin and warm compresses once XRays were taken and did not reveal osteomyelitis. Today, the hematoma is more flattened, with a slightly extended base. The serous filled blister on the apex is approx 4 cm in diameter. The blister ruptured and was draining serosanginous  fluid. Surrounding tissue was less red and less warm than on last check. Area is still very tender touch. Tissue was cleaned/dried. Tissue flap left in place. Blister compressed. Dry dressing applied until medication received from the pharmacy. Pt remains afebrile. Pt continues to work with PT and OT. Pain is controlled except with palpation of the hematoma. Otherwise, VSS. No other complaints.    Past Medical History:  Diagnosis Date  . CAD (coronary artery disease)   . Cervicalgia   . CHF (congestive heart failure) (HCC)   . Diastolic heart failure (HCC)   . Foot drop, right   . Hyperlipidemia    unspecified  . Hypertension   . Myocardial infarction (HCC)   . Osteoarthritis   . Shoulder pain, left   . Tremor, essential    Past Surgical History:  Procedure Laterality Date  . BACK SURGERY  06/2010  . CARDIAC CATHETERIZATION Left 04/30/2016   Procedure: Left Heart Cath and Coronary Angiography;  Surgeon: Laurier Nancy, MD;  Location: [facility] INVASIVE CV LAB;  Service: Cardiovascular;  Laterality: Left;  . CORONARY ANGIOPLASTY    . KNEE ARTHROSCOPY    . KNEE SURGERY Left 1998    No Known Allergies  Allergies as of 09/12/2017   No Known Allergies     Medication List        Accurate as of 09/12/17  1:55 PM. Always use your most recent med list.          alum & mag hydroxide-simeth 200-200-20 MG/5ML suspension Commonly known as:  MAALOX/MYLANTA Take Dylan mLs  by mouth every 6 (six) hours as needed for indigestion.   aspirin EC 81 MG tablet Take 81 mg by mouth daily.   atorvastatin 10 MG tablet Commonly known as:  LIPITOR Take 10 mg by mouth daily.   carvedilol 12.5 MG tablet Commonly known as:  COREG Take 12.5 mg by mouth 2 (two) times daily.   CENTROVITE Tabs Take 1 tablet by mouth daily.   docusate sodium 100 MG capsule Commonly known as:  COLACE Take 200 mg by mouth at bedtime.   ENSURE ENLIVE PO Take 1 Bottle by mouth 2 (two) times daily between meals.     feeding supplement (PRO-STAT SUGAR FREE 64) Liqd Take 30 mLs by mouth 2 (two) times daily between meals.   guaiFENesin-dextromethorphan 100-10 MG/5ML syrup Commonly known as:  ROBITUSSIN DM Take 5 mLs by mouth every 4 (four) hours as needed for cough.   hydrALAZINE 100 MG tablet Commonly known as:  APRESOLINE Take 1 tablet (100 mg total) by mouth 2 (two) times daily.   ipratropium-albuterol 0.5-2.5 (3) MG/3ML Soln Commonly known as:  DUONEB Take 3 mLs by nebulization every 6 (six) hours as needed.   isosorbide mononitrate 30 MG 24 hr tablet Commonly known as:  IMDUR Take 30 mg by mouth daily.   lisinopril 5 MG tablet Commonly known as:  PRINIVIL,ZESTRIL Take 5 mg by mouth daily.   nitroGLYCERIN 0.3 MG SL tablet Commonly known as:  NITROSTAT Place 0.3 mg under the tongue every 5 (five) minutes as needed for chest pain.   primidone 50 MG tablet Commonly known as:  MYSOLINE Take 200 mg by mouth 2 (two) times daily. 4 tabs   propranolol ER 120 MG 24 hr capsule Commonly known as:  INDERAL LA Take 1 capsule (120 mg total) by mouth daily.   RISA-BID PROBIOTIC Tabs Take 1 tablet by mouth daily.   spironolactone 25 MG tablet Commonly known as:  ALDACTONE Take 1 tablet by mouth daily.   torsemide 20 MG tablet Commonly known as:  DEMADEX Take 20 mg by mouth daily.   umeclidinium-vilanterol 62.5-25 MCG/INH Aepb Commonly known as:  ANORO ELLIPTA Inhale 1 puff into the lungs daily.       Review of Systems  Constitutional: Negative for activity change, appetite change, chills, diaphoresis and fever.  HENT: Negative for congestion, mouth sores, nosebleeds, postnasal drip, sneezing, sore throat, trouble swallowing and voice change.   Respiratory: Negative for apnea, cough, choking, chest tightness, shortness of breath and wheezing.   Cardiovascular: Positive for leg swelling. Negative for chest pain and palpitations.  Gastrointestinal: Negative for abdominal distention,  abdominal pain, constipation, diarrhea and nausea.  Genitourinary: Negative for difficulty urinating, dysuria, frequency and urgency.  Musculoskeletal: Positive for arthralgias (typical arthritis) and gait problem. Negative for back pain and myalgias.  Skin: Positive for wound. Negative for color change, pallor and rash.  Neurological: Negative for dizziness, tremors, syncope, speech difficulty, weakness, numbness and headaches.  Psychiatric/Behavioral: Negative for agitation and behavioral problems.  All other systems reviewed and are negative.   Immunization History  Administered Date(s) Administered  . Influenza,inj,Quad PF,6+ Mos 06/11/2016   Pertinent  Health Maintenance Due  Topic Date Due  . PNA vac Low Risk Adult (1 of 2 - PCV13) 09/14/2002  . INFLUENZA VACCINE  04/02/2017   No flowsheet data found. Functional Status Survey:    Vitals:   09/12/17 1324  BP: 119/66  Pulse: 63  Resp: 20  Temp: 98.8 F (37.1 C)  TempSrc: Oral  SpO2:  98%  Weight: (!) 301 lb 6.4 oz (136.7 kg)  Height: 5\' 9"  (1.753 m)   Body mass index is 44.51 kg/m. Physical Exam  Constitutional: He is oriented to person, place, and time. Vital signs are normal. He appears well-developed and well-nourished. He is active and cooperative. He does not appear ill. No distress.  HENT:  Head: Normocephalic and atraumatic.  Mouth/Throat: Uvula is midline, oropharynx is clear and moist and mucous membranes are normal. Mucous membranes are not pale, not dry and not cyanotic.  Eyes: Conjunctivae, EOM and lids are normal. Pupils are equal, round, and reactive to light.  Neck: Trachea normal, normal range of motion and full passive range of motion without pain. Neck supple. No JVD present. No tracheal deviation, no edema and no erythema present. No thyromegaly present.  Cardiovascular: Normal rate, regular rhythm, normal heart sounds, intact distal pulses and normal pulses. Exam reveals no gallop, no distant heart  sounds and no friction rub.  No murmur heard. Pulses:      Dorsalis pedis pulses are 2+ on the right side, and 2+ on the left side.  2-3+ BLE pitting edema  Pulmonary/Chest: Effort normal. No accessory muscle usage. No respiratory distress. He has decreased breath sounds in the right lower field and the left lower field. He has no wheezes. He has no rhonchi. He has no rales. He exhibits no tenderness.  Abdominal: Soft. Normal appearance and bowel sounds are normal. He exhibits no distension and no ascites. There is no tenderness.  Musculoskeletal: Normal range of motion. He exhibits no edema or tenderness.  Expected osteoarthritis, stiffness; Bilateral Calves soft, supple. Negative Homan's Sign. B- pedal pulses equal; generalized weakness  Neurological: He is alert and oriented to person, place, and time. He has normal strength. A sensory deficit is present. Coordination and gait abnormal.  Skin: Skin is warm, dry and intact. He is not diaphoretic. No cyanosis. No pallor. Nails show no clubbing.     Psychiatric: He has a normal mood and affect. His speech is normal and behavior is normal. Judgment and thought content normal. Cognition and memory are normal.  Nursing note and vitals reviewed.   Labs reviewed: Recent Labs    08/31/17 0259 09/02/17 0426 09/08/17 0400  NA 144 141 140  K 3.4* 3.7 3.8  CL 104 99* 104  CO2 32 31 24  GLUCOSE 117* 123* 119*  BUN 39* 41* 47*  CREATININE 1.56* 1.51* 1.45*  CALCIUM 9.0 8.5* 8.2*  MG  --   --  2.1   Recent Labs    08/31/17 0259 09/08/17 0400  AST 46* 61*  ALT 45 96*  ALKPHOS 105 110  BILITOT 0.8 0.9  PROT 7.1 5.9*  ALBUMIN 3.8 2.9*   Recent Labs    08/31/17 0259 09/04/17 0442 09/08/17 0930  WBC 7.9 8.3 11.8*  NEUTROABS  --   --  8.0*  HGB 14.4 12.2* 13.5  HCT 42.6 36.3* 40.6  MCV 94.4 94.7 95.0  PLT 153 146* 208   Lab Results  Component Value Date   TSH 3.782 08/31/2017   No results found for: HGBA1C Lab Results    Component Value Date   CHOL 100 09/08/2017   HDL 34 (L) 09/08/2017   LDLCALC 54 09/08/2017   TRIG 61 09/08/2017   CHOLHDL 2.9 09/08/2017    Significant Diagnostic Results in last 30 days:  Dg Chest 1 View  Result Date: 09/02/2017 CLINICAL DATA:  Cough EXAM: CHEST 1 VIEW COMPARISON:  08/31/2017 FINDINGS:  Cardiac shadow is enlarged but stable. Aortic calcifications are again seen. The lungs are well aerated bilaterally. No focal infiltrate is noted. No bony abnormality is seen. IMPRESSION: No active disease. Electronically Signed   By: Alcide CleverMark  Lukens M.D.   On: 09/02/2017 09:58   Dg Chest 2 View  Result Date: 08/31/2017 CLINICAL DATA:  82 year old [name] with cough. EXAM: CHEST  2 VIEW COMPARISON:  Chest radiograph dated 06/10/2016 FINDINGS: Mild bibasilar atelectatic changes. There is diffuse interstitial coarsening. No focal consolidation, pleural effusion, or pneumothorax. Mild cardiomegaly. No acute osseous pathology. IMPRESSION: 1. No acute cardiopulmonary process. 2. Mild cardiomegaly and chronic interstitial coarsening. Electronically Signed   By: Elgie CollardArash  Radparvar M.D.   On: 08/31/2017 03:50   Dg Tibia/fibula Right  Result Date: 08/31/2017 CLINICAL DATA:  82 year old [name] with fall and right lower extremity pain. EXAM: RIGHT TIBIA AND FIBULA - 2 VIEW COMPARISON:  Right femur radiograph dated 06/04/2010 FINDINGS: There is no acute fracture or dislocation. Mild osteopenia. Stable sclerotic changes of the distal femur most consistent with an enchondroma. There is mild arthritic changes of the knee with meniscal calcinosis. Focal soft tissue hematoma noted in the anterolateral soft tissues of the mid calf. Small calcifications of the skin likely related to chronic venous stasis. IMPRESSION: 1. No acute fracture or dislocation. 2. Nonacute findings as described above. Electronically Signed   By: Elgie CollardArash  Radparvar M.D.   On: 08/31/2017 03:53   Dg Knee 4 Views W/patella Left  Result Date:  09/01/2017 CLINICAL DATA:  Left knee pain and limited range of motion. Weakness. No known injury. EXAM: LEFT KNEE - COMPLETE 4+ VIEW COMPARISON:  08/08/2016. FINDINGS: Marked lateral joint space narrowing with mild progression. Spur formation involving all 3 joint compartments. Medial meniscal calcification. No effusion seen. Diffuse subcutaneous edema. IMPRESSION: 1. Tricompartmental degenerative changes, most pronounced involving the lateral compartment. 2. Chondrocalcinosis. 3. Diffuse subcutaneous edema. Electronically Signed   By: Beckie SaltsSteven  Reid M.D.   On: 09/01/2017 07:43    Assessment/Plan Hematoma of leg, right, subsequent encounter Blood blister  Silver sulfadiazine 1% cream- apply thin film to area of blisters/hematoma/cellulitis of the RLE daily.  Cover with ABD and Ace wrap  Monitor for edema  Wrap leg with Ace wrap starting at the toes and working the way up to the knee  Elevate leg when at rest  Continue warm moist compresses to the hematoma twice daily  Course of IV clindamycin completed today  Follow up with orthopedics as instructed  Family/ staff Communication:   Total Time:  Documentation:  Face to Face:  Family/Phone:   Labs/tests ordered:    Medication list reviewed and assessed for continued appropriateness.  Brynda RimShannon H. Kenza Munar, NP-C Geriatrics [facility] [phone] [address] [place], [place] [address] Cell Phone (Mon-Fri 8am-5pm):  [phone] On Call:  [phone] & follow prompts after 5pm & weekends Office Phone:  [phone] Office Fax:  [phone]

## 2017-09-18 ENCOUNTER — Encounter: Payer: Self-pay | Admitting: Gerontology

## 2017-09-18 ENCOUNTER — Non-Acute Institutional Stay (SKILLED_NURSING_FACILITY): Payer: Medicare Other | Admitting: Gerontology

## 2017-09-18 DIAGNOSIS — I509 Heart failure, unspecified: Secondary | ICD-10-CM | POA: Diagnosis not present

## 2017-09-18 DIAGNOSIS — S8011XD Contusion of right lower leg, subsequent encounter: Secondary | ICD-10-CM

## 2017-09-18 NOTE — Progress Notes (Signed)
Location:   [facility] Room Number: [place] of Service:  SNF [id number]) Provider:  Toni Arthurs, NP-C  Albina Billet, MD  Patient Care Team: Albina Billet, MD as PCP - General (Internal Medicine)  Extended Emergency Contact Information Primary Emergency Contact: Moccia,[name] Address: [address], [place] [address] Johnnette Litter of [place] Phone: [phone] Mobile Phone: [phone] Relation: Spouse Secondary Emergency Contact: Lilli Light of [place] Phone: [phone] Mobile Phone: [phone] Relation: Son  Code Status: FULL Goals of care: Advanced Directive information Advanced Directives 09/18/2017  Does Patient Have a Medical Advance Directive? Yes  Type of Advance Directive [place]  Does patient want to make changes to medical advance directive? No - Patient declined  Copy of [place] in Chart? Yes  Would patient like information on creating a medical advance directive? -     Chief Complaint  Patient presents with  . Medical Management of Chronic Issues    Routine Visit    HPI:  Pt is a 82 y.o. male seen today for medical management of chronic diseases.  Patient was admitted to the facility for rehab following admission to the hospital after a fall for generalized weakness and deconditioning.  Patient also struck his right anterior lower leg on the corner of a wall when he fell, causing a very large hematoma on the lateral aspect of the tibia that is painful with surrounding cellulitis.  The serosanguineous filled blister at the apex of the hematoma rupture last week.  Patient has been getting twice daily warm compresses to the hematoma and daily application of silver sulfadiazine and compression Ace wrap.  Patient also had 1 week course of IV clindamycin for the cellulitis.  Today, the cellulitis is improving, areas less red and less warm.  Hematoma appears  somewhat smaller.  However the top layer of skin is sloughing and surrounding area appears indurated.  Patient continues to have bilateral pedal edema.  He appears to have had a 10 pound weight gain since admission as well as noted edema to bilateral upper extremities.  Patient denies chest pain or shortness of breath.  Lungs sound clear.  Patient is also requesting something a little stronger than Tylenol for pain in the leg.  Patient reports a tingling, electrical sensation in the leg starting around the area of the hematoma.  Patient is already on gabapentin for cervicalgia.  Otherwise, patient continues to try to participate in PT and OT.  Vital signs stable.  No other complaints.     Past Medical History:  Diagnosis Date  . CAD (coronary artery disease)   . Cervicalgia   . CHF (congestive heart failure) ([place])   . Diastolic heart failure ([place])   . Foot drop, right   . Hyperlipidemia    unspecified  . Hypertension   . Myocardial infarction ([place])   . Osteoarthritis   . Shoulder pain, left   . Tremor, essential    Past Surgical History:  Procedure Laterality Date  . BACK SURGERY  06/2010  . CARDIAC CATHETERIZATION Left 04/30/2016   Procedure: Left Heart Cath and Coronary Angiography;  Surgeon: Dionisio David, MD;  Location: [facility];  Service: Cardiovascular;  Laterality: Left;  . CORONARY ANGIOPLASTY    . KNEE ARTHROSCOPY    . KNEE SURGERY Left 1998    No Known Allergies  Allergies as of 09/18/2017  No Known Allergies     Medication List        Accurate as of 09/18/17  2:02 PM. Always use your most recent med list.          acetaminophen 325 MG tablet Commonly known as:  TYLENOL Take 650 mg by mouth every 4 (four) hours as needed.   alum & mag hydroxide-simeth 200-200-20 MG/5ML suspension Commonly known as:  MAALOX/MYLANTA Take 15 mLs by mouth every 6 (six) hours as needed for indigestion.   aspirin EC 81 MG tablet Take 81 mg by mouth daily.     atorvastatin 10 MG tablet Commonly known as:  LIPITOR Take 10 mg by mouth daily.   carvedilol 12.5 MG tablet Commonly known as:  COREG Take 12.5 mg by mouth 2 (two) times daily.   CENTROVITE Tabs Take 1 tablet by mouth daily.   docusate sodium 100 MG capsule Commonly known as:  COLACE Take 200 mg by mouth at bedtime.   ENSURE ENLIVE PO Take 1 Bottle by mouth 2 (two) times daily between meals.   feeding supplement (PRO-STAT SUGAR FREE 64) Liqd Take 30 mLs by mouth 2 (two) times daily between meals.   gabapentin 300 MG capsule Commonly known as:  NEURONTIN Take 300 mg by mouth 3 (three) times daily.   guaiFENesin-dextromethorphan 100-10 MG/5ML syrup Commonly known as:  ROBITUSSIN DM Take 5 mLs by mouth every 4 (four) hours as needed for cough.   hydrALAZINE 100 MG tablet Commonly known as:  APRESOLINE Take 1 tablet (100 mg total) by mouth 2 (two) times daily.   ipratropium-albuterol 0.5-2.5 (3) MG/3ML Soln Commonly known as:  DUONEB Take 3 mLs by nebulization every 6 (six) hours as needed.   isosorbide mononitrate 30 MG 24 hr tablet Commonly known as:  IMDUR Take 30 mg by mouth daily.   lisinopril 5 MG tablet Commonly known as:  PRINIVIL,ZESTRIL Take 5 mg by mouth daily.   nitroGLYCERIN 0.3 MG SL tablet Commonly known as:  NITROSTAT Place 0.3 mg under the tongue every 5 (five) minutes as needed for chest pain.   primidone 50 MG tablet Commonly known as:  MYSOLINE Take 200 mg by mouth 2 (two) times daily. 4 tabs   propranolol ER 120 MG 24 hr capsule Commonly known as:  INDERAL LA Take 1 capsule (120 mg total) by mouth daily.   RISA-BID PROBIOTIC Tabs Take 1 tablet by mouth daily.   spironolactone 25 MG tablet Commonly known as:  ALDACTONE Take 1 tablet by mouth daily.   torsemide 20 MG tablet Commonly known as:  DEMADEX Take 20 mg by mouth daily.   umeclidinium-vilanterol 62.5-25 MCG/INH Aepb Commonly known as:  ANORO ELLIPTA Inhale 1 puff into the  lungs daily.       Review of Systems  Constitutional: Negative for activity change, appetite change, chills, diaphoresis and fever.  HENT: Negative for congestion, mouth sores, nosebleeds, postnasal drip, sneezing, sore throat, trouble swallowing and voice change.   Respiratory: Negative for apnea, cough, choking, chest tightness, shortness of breath and wheezing.   Cardiovascular: Positive for leg swelling. Negative for chest pain and palpitations.  Gastrointestinal: Negative for abdominal distention, abdominal pain, constipation, diarrhea and nausea.  Genitourinary: Negative for difficulty urinating, dysuria, frequency and urgency.  Musculoskeletal: Positive for arthralgias (typical arthritis) and gait problem. Negative for back pain and myalgias.  Skin: Positive for wound. Negative for color change, pallor and rash.  Neurological: Negative for dizziness, tremors, syncope, speech difficulty, weakness, numbness and headaches.  Psychiatric/Behavioral: Negative for agitation and behavioral problems.  All other systems reviewed and are negative.   Immunization History  Administered Date(s) Administered  . Influenza,inj,Quad PF,6+ Mos 06/11/2016   Pertinent  Health Maintenance Due  Topic Date Due  . PNA vac Low Risk Adult (1 of 2 - PCV13) 09/14/2002  . INFLUENZA VACCINE  04/02/2017   No flowsheet data found. Functional Status Survey:    Vitals:   09/18/17 1353  BP: (!) 131/59  Pulse: 67  Resp: 20  Temp: 98.4 F (36.9 C)  TempSrc: Oral  SpO2: 96%  Weight: (!) 311 lb 9.6 oz (141.3 kg)  Height: '5\' 9"'  (1.753 m)   Body mass index is 46.02 kg/m. Physical Exam  Constitutional: He is oriented to person, place, and time. Vital signs are normal. He appears well-developed and well-nourished. He is active and cooperative. He does not appear ill. No distress.  HENT:  Head: Normocephalic and atraumatic.  Mouth/Throat: Uvula is midline, oropharynx is clear and moist and mucous  membranes are normal. Mucous membranes are not pale, not dry and not cyanotic.  Eyes: Conjunctivae, EOM and lids are normal. Pupils are equal, round, and reactive to light.  Neck: Trachea normal, normal range of motion and full passive range of motion without pain. Neck supple. No JVD present. No tracheal deviation, no edema and no erythema present. No thyromegaly present.  Cardiovascular: Normal rate, regular rhythm, normal heart sounds, intact distal pulses and normal pulses. Exam reveals no gallop, no distant heart sounds and no friction rub.  No murmur heard. Pulses:      Dorsalis pedis pulses are 2+ on the right side, and 2+ on the left side.  3+ BLE edema  Pulmonary/Chest: Effort normal. No accessory muscle usage. No respiratory distress. He has decreased breath sounds in the right lower field and the left lower field. He has no wheezes. He has no rhonchi. He has no rales. He exhibits no tenderness.  Abdominal: Soft. Normal appearance and bowel sounds are normal. He exhibits no distension and no ascites. There is no tenderness.  Musculoskeletal: Normal range of motion. He exhibits no edema or tenderness.  Expected osteoarthritis, stiffness; Bilateral Calves soft, supple. Negative Homan's Sign. B- pedal pulses equal; generalized weakness  Neurological: He is alert and oriented to person, place, and time. He has normal strength.  Skin: Skin is warm, dry and intact. He is not diaphoretic. No cyanosis. No pallor. Nails show no clubbing.     Psychiatric: He has a normal mood and affect. His speech is normal and behavior is normal. Judgment and thought content normal. Cognition and memory are normal.  Nursing note and vitals reviewed.   Labs reviewed: Recent Labs    08/31/17 0259 09/02/17 0426 09/08/17 0400  NA 144 141 140  K 3.4* 3.7 3.8  CL 104 99* 104  CO2 32 31 24  GLUCOSE 117* 123* 119*  BUN 39* 41* 47*  CREATININE 1.56* 1.51* 1.45*  CALCIUM 9.0 8.5* 8.2*  MG  --   --  2.1    Recent Labs    08/31/17 0259 09/08/17 0400  AST 46* 61*  ALT 45 96*  ALKPHOS 105 110  BILITOT 0.8 0.9  PROT 7.1 5.9*  ALBUMIN 3.8 2.9*   Recent Labs    08/31/17 0259 09/04/17 0442 09/08/17 0930  WBC 7.9 8.3 11.8*  NEUTROABS  --   --  8.0*  HGB 14.4 12.2* 13.5  HCT 42.6 36.3* 40.6  MCV 94.4 94.7 95.0  PLT  153 146* 208   Lab Results  Component Value Date   TSH 3.782 08/31/2017   No results found for: HGBA1C Lab Results  Component Value Date   CHOL 100 09/08/2017   HDL 34 (L) 09/08/2017   LDLCALC 54 09/08/2017   TRIG 61 09/08/2017   CHOLHDL 2.9 09/08/2017    Significant Diagnostic Results in last 30 days:  Dg Chest 1 View  Result Date: 09/02/2017 CLINICAL DATA:  Cough EXAM: CHEST 1 VIEW COMPARISON:  08/31/2017 FINDINGS: Cardiac shadow is enlarged but stable. Aortic calcifications are again seen. The lungs are well aerated bilaterally. No focal infiltrate is noted. No bony abnormality is seen. IMPRESSION: No active disease. Electronically Signed   By: Inez Catalina M.D.   On: 09/02/2017 09:58   Dg Chest 2 View  Result Date: 08/31/2017 CLINICAL DATA:  82 year old male with cough. EXAM: CHEST  2 VIEW COMPARISON:  Chest radiograph dated 06/10/2016 FINDINGS: Mild bibasilar atelectatic changes. There is diffuse interstitial coarsening. No focal consolidation, pleural effusion, or pneumothorax. Mild cardiomegaly. No acute osseous pathology. IMPRESSION: 1. No acute cardiopulmonary process. 2. Mild cardiomegaly and chronic interstitial coarsening. Electronically Signed   By: Anner Crete M.D.   On: 08/31/2017 03:50   Dg Tibia/fibula Right  Result Date: 08/31/2017 CLINICAL DATA:  82 year old male with fall and right lower extremity pain. EXAM: RIGHT TIBIA AND FIBULA - 2 VIEW COMPARISON:  Right femur radiograph dated 06/04/2010 FINDINGS: There is no acute fracture or dislocation. Mild osteopenia. Stable sclerotic changes of the distal femur most consistent with an  enchondroma. There is mild arthritic changes of the knee with meniscal calcinosis. Focal soft tissue hematoma noted in the anterolateral soft tissues of the mid calf. Small calcifications of the skin likely related to chronic venous stasis. IMPRESSION: 1. No acute fracture or dislocation. 2. Nonacute findings as described above. Electronically Signed   By: Anner Crete M.D.   On: 08/31/2017 03:53   Dg Knee 4 Views W/patella Left  Result Date: 09/01/2017 CLINICAL DATA:  Left knee pain and limited range of motion. Weakness. No known injury. EXAM: LEFT KNEE - COMPLETE 4+ VIEW COMPARISON:  08/08/2016. FINDINGS: Marked lateral joint space narrowing with mild progression. Spur formation involving all 3 joint compartments. Medial meniscal calcification. No effusion seen. Diffuse subcutaneous edema. IMPRESSION: 1. Tricompartmental degenerative changes, most pronounced involving the lateral compartment. 2. Chondrocalcinosis. 3. Diffuse subcutaneous edema. Electronically Signed   By: Claudie Revering M.D.   On: 09/01/2017 07:43    Assessment/Plan Hematoma of the leg, right, subsequent encounter  Continue daily dressing changes with Silvadene cream, ABD pads, and Ace wrap  Continue to elevate the leg as much as possible or tolerable  Increase gabapentin to 400 mg p.o. 3 times daily  Tramadol 50 mg p.o. every 4 hours as needed pain  Continue Tylenol 650 mg p.o. every 4 hours as needed pain  Congestive heart failure, unspecified HF chronicity, unspecified heart failure type (HCC)  Lasix 40 mg IM x1 now  Increase torsemide to 1-1/2 tablets (30 mg) p.o. daily  Continue spironolactone 25 mg p.o. daily  TED hose to the left leg daily.  On in the early a.m., off in the p.m.  Elevate BLE on pillows when at rest  Decrease carvedilol to 6.25 mg p.o. twice daily due to bradycardia and increase in torsemide  Recheck labs next week  Family/ staff Communication:   Total Time:  Documentation:  Face  to Face:  Family/Phone:   Labs/tests ordered: CBC, met C  next week  Medication list reviewed and assessed for continued appropriateness. Monthly medication orders reviewed and signed.  Vikki Ports, NP-C Geriatrics [facility] [phone], [place] [address] Cell Phone (Mon-Fri 8am-5pm):  [phone] On Call:  [phone] & follow prompts after 5pm & weekends Office Phone:  [phone] Office Fax:  [phone]

## 2017-09-22 DIAGNOSIS — S8011XD Contusion of right lower leg, subsequent encounter: Secondary | ICD-10-CM | POA: Insufficient documentation

## 2017-09-22 DIAGNOSIS — T148XXA Other injury of unspecified body region, initial encounter: Secondary | ICD-10-CM | POA: Insufficient documentation

## 2017-09-23 ENCOUNTER — Non-Acute Institutional Stay (SKILLED_NURSING_FACILITY): Payer: Medicare Other | Admitting: Gerontology

## 2017-09-23 ENCOUNTER — Encounter (INDEPENDENT_AMBULATORY_CARE_PROVIDER_SITE_OTHER): Payer: Self-pay | Admitting: Vascular Surgery

## 2017-09-23 ENCOUNTER — Ambulatory Visit (INDEPENDENT_AMBULATORY_CARE_PROVIDER_SITE_OTHER): Payer: Medicare Other | Admitting: Vascular Surgery

## 2017-09-23 VITALS — BP 116/58 | HR 57 | Resp 20 | Ht 69.0 in | Wt 306.0 lb

## 2017-09-23 DIAGNOSIS — I872 Venous insufficiency (chronic) (peripheral): Secondary | ICD-10-CM

## 2017-09-23 DIAGNOSIS — L97211 Non-pressure chronic ulcer of right calf limited to breakdown of skin: Secondary | ICD-10-CM | POA: Diagnosis not present

## 2017-09-23 DIAGNOSIS — I83012 Varicose veins of right lower extremity with ulcer of calf: Secondary | ICD-10-CM | POA: Diagnosis not present

## 2017-09-23 DIAGNOSIS — I509 Heart failure, unspecified: Secondary | ICD-10-CM

## 2017-09-23 DIAGNOSIS — E785 Hyperlipidemia, unspecified: Secondary | ICD-10-CM | POA: Diagnosis not present

## 2017-09-23 DIAGNOSIS — I890 Lymphedema, not elsewhere classified: Secondary | ICD-10-CM | POA: Diagnosis not present

## 2017-09-23 DIAGNOSIS — I10 Essential (primary) hypertension: Secondary | ICD-10-CM

## 2017-09-23 NOTE — Assessment & Plan Note (Signed)
Although this currently looks like a somewhat shallow ulceration, given the underlying hematoma I think this will likely break down and have some liquefactive hematoma expressed.  He is going to have a fair size ulceration that weeks to heal.  I think an Unna boot would be the best therapy.  Were asked going to place that on both legs due to the massive swelling on the left leg too.  Change weekly.  Recheck in 3-4 weeks

## 2017-09-23 NOTE — Assessment & Plan Note (Signed)
BLE painful, edematous.  Patient reports the pain is improved when legs are in the dependent position.  Bilateral pedal pulses faint but equal.  Bilateral capillary refills equal and WNL.  RLE with large hematoma and cellulitis.  Patient scheduled for office visit with vascular MD today.

## 2017-09-23 NOTE — Patient Instructions (Signed)

## 2017-09-23 NOTE — Assessment & Plan Note (Signed)
Swelling is much worse.  Unna boots today.  Change weekly and recheck in 3-4 weeks.

## 2017-09-23 NOTE — Assessment & Plan Note (Signed)
Could certainly exacerbate lower extremity swelling.

## 2017-09-23 NOTE — Assessment & Plan Note (Signed)
lipid control important in reducing the progression of atherosclerotic disease. Continue statin therapy  

## 2017-09-23 NOTE — Progress Notes (Signed)
Patient ID: Dylan [name], male   DOB: 07-Jun-1936, 82 y.o.   MRN: [id number]  Chief Complaint  Patient presents with  . New Patient (Initial Visit)    Bilateral leg swelling    HPI Dylan [name] is a 82 y.o. male.  I am asked to see the patient by Dr. Marry Guan for evaluation of leg swelling.  He has been previously seen in our office for leg swelling and lymphedema back in 2017, and was at that time reporting that he was doing well with compression stockings and did not desire any other treatments for his lymphedema.  The patient reports what seems like a minor trauma to his right leg resulting in a significant hematoma in the right lateral and anterior mid calf area.  This was a very large pain that has decreased in size with Ace wraps.  It has left a very irregular, 6-8 cm ulceration in that place.  Following that, he has been sitting with his legs dependent much of the time in the left leg is gotten very swollen.  He was seen by Dr. Marry Guan who referred him to see [place].  He denies fever or chills.  His activity level has been very poor.   Past Medical History:  Diagnosis Date  . CAD (coronary artery disease)   . Cervicalgia   . CHF (congestive heart failure) ([place])   . Diastolic heart failure ([place])   . Foot drop, right   . Hyperlipidemia    unspecified  . Hypertension   . Myocardial infarction ([place])   . Osteoarthritis   . Shoulder pain, left   . Tremor, essential     Past Surgical History:  Procedure Laterality Date  . BACK SURGERY  06/2010  . CARDIAC CATHETERIZATION Left 04/30/2016   Procedure: Left Heart Cath and Coronary Angiography;  Surgeon: Dionisio David, MD;  Location: [facility];  Service: Cardiovascular;  Laterality: Left;  . CORONARY ANGIOPLASTY    . KNEE ARTHROSCOPY    . KNEE SURGERY Left 1998    Family History  Problem Relation Age of Onset  . Diabetes Son   . Cancer Mother   . Colon cancer Mother   . Lung cancer Mother   . Tremor Father   . Tremor  Brother      Social History Social History   Tobacco Use  . Smoking status: Former Smoker    Packs/day: 0.25    Years: 58.00    Pack years: 14.50    Types: Cigarettes  . Smokeless tobacco: Never Used  Substance Use Topics  . Alcohol use: No    Alcohol/week: 0.0 oz  . Drug use: No     No Known Allergies  Current Outpatient Medications  Medication Sig Dispense Refill  . acetaminophen (TYLENOL) 325 MG tablet Take 650 mg by mouth every 4 (four) hours as needed.    Marland Kitchen alum & mag hydroxide-simeth (MAALOX/MYLANTA) 200-200-20 MG/5ML suspension Take 15 mLs by mouth every 6 (six) hours as needed for indigestion. 355 mL 0  . Amino Acids-Protein Hydrolys (FEEDING SUPPLEMENT, PRO-STAT SUGAR FREE 64,) LIQD Take 30 mLs by mouth 2 (two) times daily between meals.    Marland Kitchen aspirin EC 81 MG tablet Take 81 mg by mouth daily.    Marland Kitchen atorvastatin (LIPITOR) 10 MG tablet Take 10 mg by mouth daily.    . carvedilol (COREG) 12.5 MG tablet Take 12.5 mg by mouth 2 (two) times daily.    Marland Kitchen docusate sodium (COLACE)  100 MG capsule Take 200 mg by mouth at bedtime.    . gabapentin (NEURONTIN) 300 MG capsule Take 300 mg by mouth 3 (three) times daily.    Marland Kitchen guaiFENesin-dextromethorphan (ROBITUSSIN DM) 100-10 MG/5ML syrup Take 5 mLs by mouth every 4 (four) hours as needed for cough. 118 mL 0  . hydrALAZINE (APRESOLINE) 100 MG tablet Take 1 tablet (100 mg total) by mouth 2 (two) times daily.    Marland Kitchen ipratropium-albuterol (DUONEB) 0.5-2.5 (3) MG/3ML SOLN Take 3 mLs by nebulization every 6 (six) hours as needed.    . isosorbide mononitrate (IMDUR) 30 MG 24 hr tablet Take 30 mg by mouth daily.     Marland Kitchen lisinopril (PRINIVIL,ZESTRIL) 5 MG tablet Take 5 mg by mouth daily.     . Multiple Vitamins-Minerals (CENTROVITE) TABS Take 1 tablet by mouth daily.    . nitroGLYCERIN (NITROSTAT) 0.3 MG SL tablet Place 0.3 mg under the tongue every 5 (five) minutes as needed for chest pain.    . Nutritional Supplements (ENSURE ENLIVE PO) Take 1  Bottle by mouth 2 (two) times daily between meals.    . potassium chloride (KLOR-CON) 20 MEQ packet Take by mouth once.    . primidone (MYSOLINE) 50 MG tablet Take 200 mg by mouth 2 (two) times daily. 4 tabs    . Probiotic Product (RISA-BID PROBIOTIC) TABS Take 1 tablet by mouth daily.    . propranolol ER (INDERAL LA) 120 MG 24 hr capsule Take 1 capsule (120 mg total) by mouth daily.    Marland Kitchen spironolactone (ALDACTONE) 25 MG tablet Take 1 tablet by mouth daily.     Marland Kitchen torsemide (DEMADEX) 20 MG tablet Take 20 mg by mouth daily.     . traMADol (ULTRAM) 50 MG tablet Take by mouth.    . umeclidinium-vilanterol (ANORO ELLIPTA) 62.5-25 MCG/INH AEPB Inhale 1 puff into the lungs daily.     No current facility-administered medications for this visit.       REVIEW OF SYSTEMS (Negative unless checked)  Constitutional: '[]' Weight loss  '[]' Fever  '[]' Chills Cardiac: '[]' Chest pain   '[]' Chest pressure   '[]' Palpitations   '[]' Shortness of breath when laying flat   '[]' Shortness of breath at rest   '[]' Shortness of breath with exertion. Vascular:  '[]' Pain in legs with walking   '[]' Pain in legs at rest   '[]' Pain in legs when laying flat   '[]' Claudication   '[]' Pain in feet when walking  '[]' Pain in feet at rest  '[]' Pain in feet when laying flat   '[]' History of DVT   '[]' Phlebitis   '[x]' Swelling in legs   '[x]' Varicose veins   '[x]' Non-healing ulcers Pulmonary:   '[]' Uses home oxygen   '[]' Productive cough   '[]' Hemoptysis   '[]' Wheeze  '[]' COPD   '[]' Asthma Neurologic:  '[]' Dizziness  '[]' Blackouts   '[]' Seizures   '[]' History of stroke   '[]' History of TIA  '[]' Aphasia   '[]' Temporary blindness   '[]' Dysphagia   '[]' Weakness or numbness in arms   '[]' Weakness or numbness in legs Musculoskeletal:  '[x]' Arthritis   '[]' Joint swelling   '[]' Joint pain   '[]' Low back pain Hematologic:  '[]' Easy bruising  '[]' Easy bleeding   '[]' Hypercoagulable state   '[]' Anemic  '[]' Hepatitis Gastrointestinal:  '[]' Blood in stool   '[]' Vomiting blood  '[]' Gastroesophageal reflux/heartburn   '[]' Abdominal  pain Genitourinary:  '[]' Chronic kidney disease   '[]' Difficult urination  '[]' Frequent urination  '[]' Burning with urination   '[]' Hematuria Skin:  '[]' Rashes   '[x]' Ulcers   '[x]' Wounds Psychological:  '[]' History of anxiety   '[]'  History of major  depression.    Physical Exam BP (!) 116/58 (BP Location: Right Arm, Patient Position: Sitting)   Pulse (!) 57   Resp 20   Ht '5\' 9"'  (1.753 m)   Wt (!) 138.8 kg (306 lb)   BMI 45.19 kg/m  Gen:  WD/WN, NAD Head: [place]/AT, No temporalis wasting.  Ear/Nose/Throat: Hearing grossly intact, nares w/o erythema or drainage, oropharynx w/o Erythema/Exudate Eyes: Conjunctiva clear, sclera non-icteric  Neck: trachea midline.  No JVD.  Pulmonary:  Good air movement, respirations not labored, no use of accessory muscles Cardiac: Irregular Vascular:  Vessel Right Left  Radial Palpable Palpable                          PT Not Palpable Not Palpable  DP Not Palpable Not Palpable   Gastrointestinal: soft, non-tender/non-distended.  Musculoskeletal: Right foot drop.  No deformity or atrophy.  1-2+ right lower extremity edema, 3-4+ left lower extremity edema.  Irregular ulceration on the right lateral and anterior calf as described above Neurologic: Sensation grossly intact in extremities.  Symmetrical.  Speech is fluent. Motor exam as listed above. Psychiatric: Judgment intact, Mood & affect appropriate for pt's clinical situation. Dermatologic: Right calf ulceration   Radiology Dg Chest 1 View  Result Date: 09/02/2017 CLINICAL DATA:  Cough EXAM: CHEST 1 VIEW COMPARISON:  08/31/2017 FINDINGS: Cardiac shadow is enlarged but stable. Aortic calcifications are again seen. The lungs are well aerated bilaterally. No focal infiltrate is noted. No bony abnormality is seen. IMPRESSION: No active disease. Electronically Signed   By: Inez Catalina M.D.   On: 09/02/2017 09:58   Dg Chest 2 View  Result Date: 08/31/2017 CLINICAL DATA:  82 year old male with cough. EXAM: CHEST  2  VIEW COMPARISON:  Chest radiograph dated 06/10/2016 FINDINGS: Mild bibasilar atelectatic changes. There is diffuse interstitial coarsening. No focal consolidation, pleural effusion, or pneumothorax. Mild cardiomegaly. No acute osseous pathology. IMPRESSION: 1. No acute cardiopulmonary process. 2. Mild cardiomegaly and chronic interstitial coarsening. Electronically Signed   By: Anner Crete M.D.   On: 08/31/2017 03:50   Dg Tibia/fibula Right  Result Date: 08/31/2017 CLINICAL DATA:  82 year old male with fall and right lower extremity pain. EXAM: RIGHT TIBIA AND FIBULA - 2 VIEW COMPARISON:  Right femur radiograph dated 06/04/2010 FINDINGS: There is no acute fracture or dislocation. Mild osteopenia. Stable sclerotic changes of the distal femur most consistent with an enchondroma. There is mild arthritic changes of the knee with meniscal calcinosis. Focal soft tissue hematoma noted in the anterolateral soft tissues of the mid calf. Small calcifications of the skin likely related to chronic venous stasis. IMPRESSION: 1. No acute fracture or dislocation. 2. Nonacute findings as described above. Electronically Signed   By: Anner Crete M.D.   On: 08/31/2017 03:53   Dg Knee 4 Views W/patella Left  Result Date: 09/01/2017 CLINICAL DATA:  Left knee pain and limited range of motion. Weakness. No known injury. EXAM: LEFT KNEE - COMPLETE 4+ VIEW COMPARISON:  08/08/2016. FINDINGS: Marked lateral joint space narrowing with mild progression. Spur formation involving all 3 joint compartments. Medial meniscal calcification. No effusion seen. Diffuse subcutaneous edema. IMPRESSION: 1. Tricompartmental degenerative changes, most pronounced involving the lateral compartment. 2. Chondrocalcinosis. 3. Diffuse subcutaneous edema. Electronically Signed   By: Claudie Revering M.D.   On: 09/01/2017 07:43    Labs Recent Results (from the past 2160 hour(s))  CBC     Status: Abnormal   Collection Time: 08/31/17  2:59 AM  Result Value Ref Range   WBC 7.9 3.8 - 10.6 K/uL   RBC 4.52 4.40 - 5.90 MIL/uL   Hemoglobin 14.4 13.0 - 18.0 g/dL   HCT 42.6 40.0 - 52.0 %   MCV 94.4 80.0 - 100.0 fL   MCH 31.9 26.0 - 34.0 pg   MCHC 33.8 32.0 - 36.0 g/dL   RDW 15.3 (H) 11.5 - 14.5 %   Platelets 153 150 - 440 K/uL    Comment: Performed at [facility], [address] [place], [place] [address]  Comprehensive metabolic panel     Status: Abnormal   Collection Time: 08/31/17  2:59 AM  Result Value Ref Range   Sodium 144 135 - 145 mmol/L   Potassium 3.4 (L) 3.5 - 5.1 mmol/L   Chloride 104 101 - 111 mmol/L   CO2 32 22 - 32 mmol/L   Glucose, Bld 117 (H) 65 - 99 mg/dL   BUN 39 (H) 6 - 20 mg/dL   Creatinine, Ser 1.56 (H) 0.61 - 1.24 mg/dL   Calcium 9.0 8.9 - 10.3 mg/dL   Total Protein 7.1 6.5 - 8.1 g/dL   Albumin 3.8 3.5 - 5.0 g/dL   AST 46 (H) 15 - 41 U/L   ALT 45 17 - 63 U/L   Alkaline Phosphatase 105 38 - 126 U/L   Total Bilirubin 0.8 0.3 - 1.2 mg/dL   GFR calc non Af Amer 41 (L) >60 mL/min   GFR calc Af Amer 47 (L) >60 mL/min    Comment: (NOTE) The eGFR has been calculated using the CKD EPI equation. This calculation has not been validated in all clinical situations. eGFR's persistently <60 mL/min signify possible Chronic Kidney Disease.    Anion gap 8 5 - 15    Comment: Performed at [facility], [address] [place], [place] [address]  TSH     Status: None   Collection Time: 08/31/17  2:59 AM  Result Value Ref Range   TSH 3.782 0.350 - 4.500 uIU/mL    Comment: Performed by a 3rd Generation assay with a functional sensitivity of <=0.01 uIU/mL. Performed at [facility], [address] [place], [place] [address]   Urinalysis, Complete w Microscopic     Status: Abnormal   Collection Time: 08/31/17  4:10 AM  Result Value Ref Range   Color, Urine YELLOW (A) YELLOW   APPearance CLEAR (A) CLEAR   Specific Gravity, Urine 1.015 1.005 - 1.030   pH 6.0 5.0 - 8.0   Glucose, UA  NEGATIVE NEGATIVE mg/dL   Hgb urine dipstick NEGATIVE NEGATIVE   Bilirubin Urine NEGATIVE NEGATIVE   Ketones, ur NEGATIVE NEGATIVE mg/dL   Protein, ur 30 (A) NEGATIVE mg/dL   Nitrite NEGATIVE NEGATIVE   Leukocytes, UA NEGATIVE NEGATIVE   RBC / HPF 0-5 0 - 5 RBC/hpf   WBC, UA 0-5 0 - 5 WBC/hpf   Bacteria, UA NONE SEEN NONE SEEN   Squamous Epithelial / LPF NONE SEEN NONE SEEN   Mucus PRESENT     Comment: Performed at [facility], [address], [place], [place] [address]  Influenza panel by PCR (type A & B)     Status: None   Collection Time: 08/31/17 10:15 AM  Result Value Ref Range   Influenza A By PCR NEGATIVE NEGATIVE   Influenza B By PCR NEGATIVE NEGATIVE    Comment: (NOTE) The Xpert Xpress Flu assay is intended as an aid in the diagnosis of  influenza and  should not be used as a sole basis for treatment.  This  assay is FDA approved for nasopharyngeal swab specimens only. Nasal  washings and aspirates are unacceptable for Xpert Xpress Flu testing. Performed at [facility], [address] [place], [place] [address]   Basic metabolic panel     Status: Abnormal   Collection Time: 09/02/17  4:26 AM  Result Value Ref Range   Sodium 141 135 - 145 mmol/L   Potassium 3.7 3.5 - 5.1 mmol/L   Chloride 99 (L) 101 - 111 mmol/L   CO2 31 22 - 32 mmol/L   Glucose, Bld 123 (H) 65 - 99 mg/dL   BUN 41 (H) 6 - 20 mg/dL   Creatinine, Ser 1.51 (H) 0.61 - 1.24 mg/dL   Calcium 8.5 (L) 8.9 - 10.3 mg/dL   GFR calc non Af Amer 42 (L) >60 mL/min   GFR calc Af Amer 49 (L) >60 mL/min    Comment: (NOTE) The eGFR has been calculated using the CKD EPI equation. This calculation has not been validated in all clinical situations. eGFR's persistently <60 mL/min signify possible Chronic Kidney Disease.    Anion gap 11 5 - 15    Comment: Performed at [facility], [address] [place], [place] [address]  CBC     Status: Abnormal   Collection Time: 09/04/17  4:42 AM   Result Value Ref Range   WBC 8.3 3.8 - 10.6 K/uL   RBC 3.83 (L) 4.40 - 5.90 MIL/uL   Hemoglobin 12.2 (L) 13.0 - 18.0 g/dL   HCT 36.3 (L) 40.0 - 52.0 %   MCV 94.7 80.0 - 100.0 fL   MCH 31.9 26.0 - 34.0 pg   MCHC 33.7 32.0 - 36.0 g/dL   RDW 15.5 (H) 11.5 - 14.5 %   Platelets 146 (L) 150 - 440 K/uL    Comment: Performed at [facility], [address] [place], [place] [address]  Lipid panel     Status: Abnormal   Collection Time: 09/08/17  4:00 AM  Result Value Ref Range   Cholesterol 100 0 - 200 mg/dL   Triglycerides 61 <150 mg/dL   HDL 34 (L) >40 mg/dL   Total CHOL/HDL Ratio 2.9 RATIO   VLDL 12 0 - 40 mg/dL   LDL Cholesterol 54 0 - 99 mg/dL    Comment:        Total Cholesterol/HDL:CHD Risk Coronary Heart Disease Risk Table                     Men   Women  1/2 Average Risk   3.4   3.3  Average Risk       5.0   4.4  2 X Average Risk   9.6   7.1  3 X Average Risk  23.4   11.0        Use the calculated Patient Ratio above and the CHD Risk Table to determine the patient's CHD Risk.        ATP III CLASSIFICATION (LDL):  <100     mg/dL   Optimal  100-129  mg/dL   Near or Above                    Optimal  130-159  mg/dL   Borderline  160-189  mg/dL   High  >190     mg/dL   Very High Performed at [facility], [place], [place]  [address]   Magnesium     Status: None   Collection Time: 09/08/17  4:00 AM  Result Value Ref Range   Magnesium 2.1 1.7 - 2.4 mg/dL    Comment: Performed at [facility], [address] [place], [place] [address]  Comprehensive metabolic panel     Status: Abnormal   Collection Time: 09/08/17  4:00 AM  Result Value Ref Range   Sodium 140 135 - 145 mmol/L   Potassium 3.8 3.5 - 5.1 mmol/L    Comment: HEMOLYSIS AT THIS LEVEL MAY AFFECT RESULT   Chloride 104 101 - 111 mmol/L   CO2 24 22 - 32 mmol/L   Glucose, Bld 119 (H) 65 - 99 mg/dL   BUN 47 (H) 6 - 20 mg/dL   Creatinine, Ser 1.45 (H) 0.61 - 1.24  mg/dL   Calcium 8.2 (L) 8.9 - 10.3 mg/dL   Total Protein 5.9 (L) 6.5 - 8.1 g/dL   Albumin 2.9 (L) 3.5 - 5.0 g/dL   AST 61 (H) 15 - 41 U/L   ALT 96 (H) 17 - 63 U/L   Alkaline Phosphatase 110 38 - 126 U/L   Total Bilirubin 0.9 0.3 - 1.2 mg/dL   GFR calc non Af Amer 44 (L) >60 mL/min   GFR calc Af Amer 51 (L) >60 mL/min    Comment: (NOTE) The eGFR has been calculated using the CKD EPI equation. This calculation has not been validated in all clinical situations. eGFR's persistently <60 mL/min signify possible Chronic Kidney Disease.    Anion gap 12 5 - 15    Comment: Performed at [facility], [address] [place], [place] [address]  Vitamin B12     Status: None   Collection Time: 09/08/17  4:00 AM  Result Value Ref Range   Vitamin B-12 699 180 - 914 pg/mL    Comment: (NOTE) This assay is not validated for testing neonatal or myeloproliferative syndrome specimens for Vitamin B12 levels. Performed at [facility], [place] [address] [place], [place] [address]   VITAMIN D 25 Hydroxy (Vit-D Deficiency, Fractures)     Status: None   Collection Time: 09/08/17  4:00 AM  Result Value Ref Range   Vit D, 25-Hydroxy 35.3 30.0 - 100.0 ng/mL    Comment: (NOTE) Vitamin D deficiency has been defined by the [place] practice guideline as a level of serum 25-OH vitamin D less than 20 ng/mL (1,2). The Endocrine Society went on to further define vitamin D insufficiency as a level between 21 and 29 ng/mL (2). 1. IOM (Institute of Medicine). 2010. Dietary reference   intakes for calcium and D. [place]: The   [employer]. 2. Holick MF, Binkley [place], Bischoff-Ferrari HA, et al.   Evaluation, treatment, and prevention of vitamin D   deficiency: an Endocrine Society clinical practice   guideline. JCEM. 2011 Jul; 96(7):1911-30. Performed At: [facility] [place], [place] [id number] Rush Farmer MD  [id number] Performed at [facility], [address] [place],  [address]   C-reactive protein     Status: Abnormal   Collection Time: 09/08/17  4:00 AM  Result Value Ref Range   CRP 5.3 (H) <1.0 mg/dL    Comment: Performed at [facility] [address], [place],  [address]  CBC with Differential/Platelet     Status: Abnormal   Collection Time: 09/08/17  9:30 AM  Result Value Ref Range  WBC 11.8 (H) 3.8 - 10.6 K/uL   RBC 4.28 (L) 4.40 - 5.90 MIL/uL   Hemoglobin 13.5 13.0 - 18.0 g/dL   HCT 40.6 40.0 - 52.0 %   MCV 95.0 80.0 - 100.0 fL   MCH 31.5 26.0 - 34.0 pg   MCHC 33.2 32.0 - 36.0 g/dL   RDW 15.7 (H) 11.5 - 14.5 %   Platelets 208 150 - 440 K/uL   Neutrophils Relative % 68 %   Lymphocytes Relative 19 %   Monocytes Relative 9 %   Eosinophils Relative 3 %   Basophils Relative 1 %   Neutro Abs 8.0 (H) 1.4 - 6.5 K/uL   Lymphs Abs 2.2 1.0 - 3.6 K/uL   Monocytes Absolute 1.1 (H) 0.2 - 1.0 K/uL   Eosinophils Absolute 0.4 0 - 0.7 K/uL   Basophils Absolute 0.1 0 - 0.1 K/uL   RBC Morphology MIXED RBC POPULATION    WBC Morphology MILD LEFT SHIFT (1-5% METAS, OCC MYELO, OCC BANDS)     Comment: Performed at [facility], [address] [place], [place] [address]  Sedimentation rate     Status: Abnormal   Collection Time: 09/08/17  9:30 AM  Result Value Ref Range   Sed Rate 45 (H) 0 - 20 mm/hr    Comment: Performed at [facility], [address] [place], [place] [address]    Assessment/Plan:  CHF (congestive heart failure) ([place]) Could certainly exacerbate lower extremity swelling.  Hypertension blood pressure control important in reducing the progression of atherosclerotic disease. On appropriate oral medications.   Hyperlipidemia lipid control important in reducing the progression of atherosclerotic disease. Continue statin therapy   Varicose veins of lower extremities with ulcer ([place]) Although this currently looks  like a somewhat shallow ulceration, given the underlying hematoma I think this will likely break down and have some liquefactive hematoma expressed.  He is going to have a fair size ulceration that weeks to heal.  I think an Unna boot would be the best therapy.  Were asked going to place that on both legs due to the massive swelling on the left leg too.  Change weekly.  Recheck in 3-4 weeks  Lymphedema Swelling is much worse.  Unna boots today.  Change weekly and recheck in 3-4 weeks.      Leotis Pain 09/23/2017, 4:16 PM   This note was created with Dragon medical transcription system.  Any errors from dictation are unintentional.

## 2017-09-23 NOTE — Assessment & Plan Note (Signed)
Patient has received 40 mg IM Lasix x2 doses since admission, plus an increase in p.o. Torsemide.  Patient's weight continues to be significantly higher than admission weight.

## 2017-09-23 NOTE — Progress Notes (Signed)
Location:      Place of Service:  SNF (31) Provider:  Lorenso QuarryShannon Dvonte Gatliff, NP-[name]  Dylan [name], Dylan C, MD  Patient Care Team: Dylan [name], Dylan C, MD as PCP - General (Internal Medicine)  Extended Emergency Contact Information Primary Emergency Contact: [name],Dylan T Address: [address]          [place], [place] [address] [place] Home Phone: [phone] Mobile Phone: [phone] Relation: Spouse Secondary Emergency Contact: Dylan [name],Dylan  [place] Home Phone: [phone] Mobile Phone: [phone] Relation: Son  Code Status:  FULL Goals of care: Advanced Directive information Advanced Directives 09/18/2017  Does Patient Have a Medical Advance Directive? Yes  Type of Advance Directive Healthcare Power of Attorney  Does patient want to make changes to medical advance directive? No - Patient declined  Copy of Healthcare Power of Attorney in Chart? Yes  Would patient like information on creating a medical advance directive? -     Chief Complaint  Patient presents with  . Follow-up    RLE hematoma    HPI:  Pt is a 82 y.o. male seen today for medical management of chronic diseases.    CHF (congestive heart failure) (HCC) Patient has received 40 mg IM Lasix x2 doses since admission, plus an increase in p.o. Torsemide.  Patient's weight continues to be significantly higher than admission weight. Denies chest pain or shortness of breath.  Chronic venous insufficiency BLE painful, edematous.  Patient reports the pain is improved when legs are in the dependent position.  Bilateral pedal pulses faint but equal.  Bilateral capillary refills equal and WNL.  RLE with large hematoma and cellulitis.  Patient scheduled for office visit with vascular MD today.    Past Medical History:  Diagnosis Date  . CAD (coronary artery disease)   . Cervicalgia   . CHF (congestive heart failure) (HCC)   . Diastolic heart failure (HCC)   . Foot drop, right   . Hyperlipidemia    unspecified  . Hypertension   . Myocardial infarction (HCC)   . Osteoarthritis   . Shoulder pain, left   . Tremor, essential    Past Surgical History:  Procedure Laterality Date  . BACK SURGERY  06/2010  . CARDIAC CATHETERIZATION Left 04/30/2016   Procedure: Left Heart Cath and Coronary Angiography;  Surgeon: Dylan NancyShaukat A Khan, MD;  Location: [facility] INVASIVE CV LAB;  Service: Cardiovascular;  Laterality: Left;  . CORONARY ANGIOPLASTY    . KNEE ARTHROSCOPY    . KNEE SURGERY Left 1998    No Known Allergies  Allergies as of 09/23/2017   No Known Allergies     Medication List        Accurate as of 09/23/17  4:27 PM. Always use your most recent med list.          acetaminophen 325 MG tablet Commonly known as:  TYLENOL Take 650 mg by mouth every 4 (four) hours as needed.   alum & mag hydroxide-simeth 200-200-20 MG/5ML suspension Commonly known as:  MAALOX/MYLANTA Take 15 mLs by mouth every 6 (six) hours as needed for indigestion.   aspirin EC 81 MG tablet Take 81 mg by mouth daily.   atorvastatin 10 MG tablet Commonly known as:  LIPITOR Take 10 mg by mouth daily.   carvedilol 12.5 MG tablet Commonly known as:  COREG Take 12.5 mg by mouth 2 (two) times daily.   CENTROVITE Tabs Take 1 tablet by mouth daily.   docusate sodium 100 MG capsule Commonly known as:  COLACE Take 200 mg by mouth at bedtime.   ENSURE ENLIVE PO Take 1 Bottle by mouth 2 (two) times daily between meals.   feeding supplement (PRO-STAT SUGAR FREE 64) Liqd Take 30 mLs by mouth 2 (two) times daily between meals.   gabapentin 300 MG capsule Commonly known as:  NEURONTIN Take 300 mg by mouth 3 (three) times daily.   guaiFENesin-dextromethorphan 100-10 MG/5ML syrup Commonly known as:  ROBITUSSIN DM Take 5 mLs by mouth every 4 (four) hours as needed for cough.   hydrALAZINE 100 MG tablet Commonly known as:  APRESOLINE Take 1 tablet (100 mg total) by mouth 2 (two) times daily.     ipratropium-albuterol 0.5-2.5 (3) MG/3ML Soln Commonly known as:  DUONEB Take 3 mLs by nebulization every 6 (six) hours as needed.   isosorbide mononitrate 30 MG 24 hr tablet Commonly known as:  IMDUR Take 30 mg by mouth daily.   lisinopril 5 MG tablet Commonly known as:  PRINIVIL,ZESTRIL Take 5 mg by mouth daily.   nitroGLYCERIN 0.3 MG SL tablet Commonly known as:  NITROSTAT Place 0.3 mg under the tongue every 5 (five) minutes as needed for chest pain.   potassium chloride 20 MEQ packet Commonly known as:  KLOR-CON Take by mouth once.   primidone 50 MG tablet Commonly known as:  MYSOLINE Take 200 mg by mouth 2 (two) times daily. 4 tabs   propranolol ER 120 MG 24 hr capsule Commonly known as:  INDERAL LA Take 1 capsule (120 mg total) by mouth daily.   RISA-BID PROBIOTIC Tabs Take 1 tablet by mouth daily.   spironolactone 25 MG tablet Commonly known as:  ALDACTONE Take 1 tablet by mouth daily.   torsemide 20 MG tablet Commonly known as:  DEMADEX Take 20 mg by mouth daily.   ULTRAM 50 MG tablet Generic drug:  traMADol Take by mouth.   umeclidinium-vilanterol 62.5-25 MCG/INH Aepb Commonly known as:  ANORO ELLIPTA Inhale 1 puff into the lungs daily.       Review of Systems  Constitutional: Negative for activity change, appetite change, chills, diaphoresis and fever.  HENT: Negative for congestion, mouth sores, nosebleeds, postnasal drip, sneezing, sore throat, trouble swallowing and voice change.   Respiratory: Negative for apnea, cough, choking, chest tightness, shortness of breath and wheezing.   Cardiovascular: Positive for leg swelling. Negative for chest pain and palpitations.  Gastrointestinal: Negative for abdominal distention, abdominal pain, constipation, diarrhea and nausea.  Genitourinary: Negative for difficulty urinating, dysuria, frequency and urgency.  Musculoskeletal: Positive for arthralgias (typical arthritis). Negative for back pain, gait  problem and myalgias.  Skin: Positive for wound. Negative for color change, pallor and rash.  Neurological: Negative for dizziness, tremors, syncope, speech difficulty, weakness, numbness and headaches.  Psychiatric/Behavioral: Negative for agitation and behavioral problems.  All other systems reviewed and are negative.   Immunization History  Administered Date(s) Administered  . Influenza,inj,Quad PF,6+ Mos 06/11/2016   Pertinent  Health Maintenance Due  Topic Date Due  . PNA vac Low Risk Adult (1 of 2 - PCV13) 09/14/2002  . INFLUENZA VACCINE  04/02/2017   No flowsheet data found. Functional Status Survey:    Vitals:   09/23/17 0430  BP: (!) 123/58  Pulse: 78  Resp: 18  Temp: 98.6 F (37 [name])  SpO2: 96%  Weight: (!) 307 lb 3.2 oz (139.3 kg)   Body mass index is 45.37 kg/m. Physical Exam  Constitutional: He is oriented to person, place, and time. Vital signs are normal. He  appears well-developed and well-nourished. He is active and cooperative. He does not appear ill. No distress.  HENT:  Head: Normocephalic and atraumatic.  Mouth/Throat: Uvula is midline, oropharynx is clear and moist and mucous membranes are normal. Mucous membranes are not pale, not dry and not cyanotic.  Eyes: Conjunctivae, EOM and lids are normal. Pupils are equal, round, and reactive to light.  Neck: Trachea normal, normal range of motion and full passive range of motion without pain. Neck supple. No JVD present. No tracheal deviation, no edema and no erythema present. No thyromegaly present.  Cardiovascular: Normal rate, regular rhythm, normal heart sounds, intact distal pulses and normal pulses. Exam reveals no gallop, no distant heart sounds and no friction rub.  No murmur heard. Pulses:      Dorsalis pedis pulses are 2+ on the right side, and 2+ on the left side.  3+ BLE pitting edema  Pulmonary/Chest: Effort normal and breath sounds normal. No accessory muscle usage. No respiratory distress. He  has no decreased breath sounds. He has no wheezes. He has no rhonchi. He has no rales. He exhibits no tenderness.  Abdominal: Soft. Normal appearance and bowel sounds are normal. He exhibits no distension and no ascites. There is no tenderness.  Musculoskeletal: Normal range of motion. He exhibits no edema or tenderness.  Expected osteoarthritis, stiffness; Bilateral Calves soft, supple. Negative Homan's Sign. B- pedal pulses equal; generalized weakness  Neurological: He is alert and oriented to person, place, and time. He has normal strength.  Skin: Skin is warm, dry and intact. He is not diaphoretic. No cyanosis. No pallor. Nails show no clubbing.  RLE hematoma, cellulitis, edema  Psychiatric: He has a normal mood and affect. His speech is normal and behavior is normal. Judgment and thought content normal. Cognition and memory are normal.  Nursing note and vitals reviewed.   Labs reviewed: Recent Labs    08/31/17 0259 09/02/17 0426 09/08/17 0400  NA 144 141 140  K 3.4* 3.7 3.8  CL 104 99* 104  CO2 32 31 24  GLUCOSE 117* 123* 119*  BUN 39* 41* 47*  CREATININE 1.56* 1.51* 1.45*  CALCIUM 9.0 8.5* 8.2*  MG  --   --  2.1   Recent Labs    08/31/17 0259 09/08/17 0400  AST 46* 61*  ALT 45 96*  ALKPHOS 105 110  BILITOT 0.8 0.9  PROT 7.1 5.9*  ALBUMIN 3.8 2.9*   Recent Labs    08/31/17 0259 09/04/17 0442 09/08/17 0930  WBC 7.9 8.3 11.8*  NEUTROABS  --   --  8.0*  HGB 14.4 12.2* 13.5  HCT 42.6 36.3* 40.6  MCV 94.4 94.7 95.0  PLT 153 146* 208   Lab Results  Component Value Date   TSH 3.782 08/31/2017   No results found for: HGBA1C Lab Results  Component Value Date   CHOL 100 09/08/2017   HDL 34 (L) 09/08/2017   LDLCALC 54 09/08/2017   TRIG 61 09/08/2017   CHOLHDL 2.9 09/08/2017    Significant Diagnostic Results in last 30 days:  Dg Chest 1 View  Result Date: 09/02/2017 CLINICAL DATA:  Cough EXAM: CHEST 1 VIEW COMPARISON:  08/31/2017 FINDINGS: Cardiac shadow is  enlarged but stable. Aortic calcifications are again seen. The lungs are well aerated bilaterally. No focal infiltrate is noted. No bony abnormality is seen. IMPRESSION: No active disease. Electronically Signed   By: Alcide Clever M.D.   On: 09/02/2017 09:58   Dg Chest 2 View  Result Date:  08/31/2017 CLINICAL DATA:  82 year old male with cough. EXAM: CHEST  2 VIEW COMPARISON:  Chest radiograph dated 06/10/2016 FINDINGS: Mild bibasilar atelectatic changes. There is diffuse interstitial coarsening. No focal consolidation, pleural effusion, or pneumothorax. Mild cardiomegaly. No acute osseous pathology. IMPRESSION: 1. No acute cardiopulmonary process. 2. Mild cardiomegaly and chronic interstitial coarsening. Electronically Signed   By: Elgie Collard M.D.   On: 08/31/2017 03:50   Dg Tibia/fibula Right  Result Date: 08/31/2017 CLINICAL DATA:  82 year old male with fall and right lower extremity pain. EXAM: RIGHT TIBIA AND FIBULA - 2 VIEW COMPARISON:  Right femur radiograph dated 06/04/2010 FINDINGS: There is no acute fracture or dislocation. Mild osteopenia. Stable sclerotic changes of the distal femur most consistent with an enchondroma. There is mild arthritic changes of the knee with meniscal calcinosis. Focal soft tissue hematoma noted in the anterolateral soft tissues of the mid calf. Small calcifications of the skin likely related to chronic venous stasis. IMPRESSION: 1. No acute fracture or dislocation. 2. Nonacute findings as described above. Electronically Signed   By: Elgie Collard M.D.   On: 08/31/2017 03:53   Dg Knee 4 Views W/patella Left  Result Date: 09/01/2017 CLINICAL DATA:  Left knee pain and limited range of motion. Weakness. No known injury. EXAM: LEFT KNEE - COMPLETE 4+ VIEW COMPARISON:  08/08/2016. FINDINGS: Marked lateral joint space narrowing with mild progression. Spur formation involving all 3 joint compartments. Medial meniscal calcification. No effusion seen. Diffuse  subcutaneous edema. IMPRESSION: 1. Tricompartmental degenerative changes, most pronounced involving the lateral compartment. 2. Chondrocalcinosis. 3. Diffuse subcutaneous edema. Electronically Signed   By: Beckie Salts M.D.   On: 09/01/2017 07:43    Assessment/Plan Dylan [name] was seen today for follow-up.  Diagnoses and all orders for this visit:  Chronic venous insufficiency  Congestive heart failure, unspecified HF chronicity, unspecified heart failure type (HCC)   Continue to encourage patient to keep legs elevated as much as tolerable  Ace wrap or appropriately sized TED hose to bilateral lower extremities daily  Increase torsemide to 40 mg p.o. daily  Labs in 1 week  Follow recommendations given by Dr. Wyn Quaker  Family/ staff Communication:   Total Time:  Documentation:  Face to Face:  Family/Phone:   Labs/tests ordered: CBC, CRP, CMP next week  Medication list reviewed and assessed for continued appropriateness. Monthly medication orders reviewed and signed.  Brynda Rim, NP-[name] [phone], [place] [address] Cell Phone (Mon-Fri 8am-5pm):  [phone] On Call:  [phone] & follow prompts after 5pm & weekends Office Phone:  [phone] Office Fax:  [phone]

## 2017-09-23 NOTE — Assessment & Plan Note (Signed)
blood pressure control important in reducing the progression of atherosclerotic disease. On appropriate oral medications.  

## 2017-09-24 ENCOUNTER — Other Ambulatory Visit: Payer: Self-pay

## 2017-09-24 ENCOUNTER — Non-Acute Institutional Stay (SKILLED_NURSING_FACILITY): Payer: Medicare Other | Admitting: Gerontology

## 2017-09-24 DIAGNOSIS — I872 Venous insufficiency (chronic) (peripheral): Secondary | ICD-10-CM | POA: Diagnosis not present

## 2017-09-24 DIAGNOSIS — R531 Weakness: Secondary | ICD-10-CM

## 2017-09-24 DIAGNOSIS — E46 Unspecified protein-calorie malnutrition: Secondary | ICD-10-CM | POA: Diagnosis not present

## 2017-09-24 DIAGNOSIS — T148XXA Other injury of unspecified body region, initial encounter: Secondary | ICD-10-CM | POA: Diagnosis not present

## 2017-09-24 DIAGNOSIS — I509 Heart failure, unspecified: Secondary | ICD-10-CM | POA: Diagnosis not present

## 2017-09-24 DIAGNOSIS — S8011XD Contusion of right lower leg, subsequent encounter: Secondary | ICD-10-CM | POA: Diagnosis not present

## 2017-09-24 MED ORDER — TRAMADOL HCL 50 MG PO TABS: 50.0000 mg | ORAL_TABLET | ORAL | 0 refills | Status: DC | PRN

## 2017-09-24 NOTE — Telephone Encounter (Signed)
Rx sent to [facility] phone : [phone] , fax : [phone]  

## 2017-09-25 ENCOUNTER — Encounter: Payer: Self-pay | Admitting: Gerontology

## 2017-09-25 NOTE — Progress Notes (Signed)
Location:   [facility] Room Number: 210A Place of Service:  SNF [phone]  Provider: Lorenso QuarryShannon Ripley Lovecchio, NP-C  PCP: Jaclyn Shaggyate, [name], MD Patient Care Team: Jaclyn Shaggyate, [name], MD as PCP - General (Internal Medicine)  Extended Emergency Contact Information Primary Emergency Contact: Hanner,[name] Address: [address]          [place], [place] [address] [place] Home Phone: [phone] Mobile Phone: [phone] Relation: Spouse Secondary Emergency Contact: [name],[name]  [place] Home Phone: [phone] Mobile Phone: [phone] Relation: Son  Code Status: FULL Goals of care:  Advanced Directive information Advanced Directives 09/25/2017  Does Patient Have a Medical Advance Directive? Yes  Type of Estate agentAdvance Directive Healthcare Power of CrescentAttorney;Living will  Does patient want to make changes to medical advance directive? No - Patient declined  Copy of Healthcare Power of Attorney in Chart? Yes  Would patient like information on creating a medical advance directive? -     No Known Allergies  Chief Complaint  Patient presents with  . Discharge Note    Discharged from SNF    HPI:  82 y.o. male seen today for discharge evaluation. Pt was admitted to the facility for rehab for generalized weakness and deconditioning. Pt fell at home, sticking his right tibia on the corner of a wall. He developed a large hematoma with surrounding cellulitis and sanginous filled bullous. The leg had 3+ pitting edema, warm to touch and tender. Pt was treated for 1 week of IV antibiotics with improvement of symptoms. Bullous and surrounding tissue was treated with Silver Sulfadiazine and daily wraps with compression until pt was able to be seen by Vascular MD. Orders were given to apply UNNA boots and changed weekly. Ambulation encouraged. Pt participated with PT/OT for strengthening. Hematoma was painful, but improved with medications, intermittent heat, elevation  and diuretics. Pt reports his appetite has improved. He is voiding well and having regular BMs. Toes warm, pink with WNL cap refills. B-pedal pulses weak but equal. Pt reports he is feeling better and wants to go home. VSS. No other complaints.     Past Medical History:  Diagnosis Date  . CAD (coronary artery disease)   . Cervicalgia   . CHF (congestive heart failure) (HCC)   . Diastolic heart failure (HCC)   . Foot drop, right   . Hyperlipidemia    unspecified  . Hypertension   . Myocardial infarction (HCC)   . Osteoarthritis   . Shoulder pain, left   . Tremor, essential     Past Surgical History:  Procedure Laterality Date  . BACK SURGERY  06/2010  . CARDIAC CATHETERIZATION Left 04/30/2016   Procedure: Left Heart Cath and Coronary Angiography;  Surgeon: Laurier NancyShaukat A Khan, MD;  Location: [facility] INVASIVE CV LAB;  Service: Cardiovascular;  Laterality: Left;  . CORONARY ANGIOPLASTY    . KNEE ARTHROSCOPY    . KNEE SURGERY Left 1998      reports that he has quit smoking. His smoking use included cigarettes. He has a 14.50 pack-year smoking history. he has never used smokeless tobacco. He reports that he does not drink alcohol or use drugs. Social History   Socioeconomic History  . Marital status: Married    Spouse name: Eber JonesCarolyn  . Number of children: 2  . Years of education: 3211  . Highest education level: 11th grade  Social Needs  . Financial resource strain: Not on file  . Food insecurity - worry: Not on file  .  Food insecurity - inability: Not on file  . Transportation needs - medical: Not on file  . Transportation needs - non-medical: Not on file  Occupational History  . Occupation: retired  Tobacco Use  . Smoking status: Former Smoker    Packs/day: 0.25    Years: 58.00    Pack years: 14.50    Types: Cigarettes  . Smokeless tobacco: Never Used  Substance and Sexual Activity  . Alcohol use: No    Alcohol/week: 0.0 oz  . Drug use: No  . Sexual activity: Not on file    Other Topics Concern  . Not on file  Social History Narrative   Full Code with HCPOA and living will   Married with 2 children   Former smoker   Alcohol - none   Smokeless tobacco - none   Functional Status Survey:    No Known Allergies  Pertinent  Health Maintenance Due  Topic Date Due  . PNA vac Low Risk Adult (1 of 2 - PCV13) 09/14/2002  . INFLUENZA VACCINE  04/02/2017    Medications: Allergies as of 09/24/2017   No Known Allergies     Medication List        Accurate as of 09/24/17 11:59 PM. Always use your most recent med list.          acetaminophen 325 MG tablet Commonly known as:  TYLENOL Take 650 mg by mouth every 4 (four) hours as needed.   alum & mag hydroxide-simeth 200-200-20 MG/5ML suspension Commonly known as:  MAALOX/MYLANTA Take 15 mLs by mouth every 6 (six) hours as needed for indigestion.   aspirin EC 81 MG tablet Take 81 mg by mouth daily.   atorvastatin 10 MG tablet Commonly known as:  LIPITOR Take 10 mg by mouth daily.   carvedilol 12.5 MG tablet Commonly known as:  COREG Take 12.5 mg by mouth 2 (two) times daily.   CENTROVITE Tabs Take 1 tablet by mouth daily.   DERMACLOUD Crea Apply liberal amount topically to area of skin irritation as needed. Ok to leave at bedside.   docusate sodium 100 MG capsule Commonly known as:  COLACE Take 200 mg by mouth at bedtime.   ENDIT EX Apply topically to sacrum/buttocks 2 times daily and as needed   ENSURE ENLIVE PO Take 1 Bottle by mouth 2 (two) times daily between meals.   feeding supplement (PRO-STAT SUGAR FREE 64) Liqd Take 30 mLs by mouth 2 (two) times daily between meals.   gabapentin 400 MG capsule Commonly known as:  NEURONTIN Take 400 mg by mouth 3 (three) times daily.   guaiFENesin-dextromethorphan 100-10 MG/5ML syrup Commonly known as:  ROBITUSSIN DM Take 5 mLs by mouth every 4 (four) hours as needed for cough.   hydrALAZINE 100 MG tablet Commonly known as:   APRESOLINE Take 1 tablet (100 mg total) by mouth 2 (two) times daily.   ipratropium-albuterol 0.5-2.5 (3) MG/3ML Soln Commonly known as:  DUONEB Take 3 mLs by nebulization every 6 (six) hours as needed.   isosorbide mononitrate 30 MG 24 hr tablet Commonly known as:  IMDUR Take 30 mg by mouth daily.   lisinopril 5 MG tablet Commonly known as:  PRINIVIL,ZESTRIL Take 5 mg by mouth daily.   nitroGLYCERIN 0.3 MG SL tablet Commonly known as:  NITROSTAT Place 0.3 mg under the tongue every 5 (five) minutes as needed for chest pain.   primidone 50 MG tablet Commonly known as:  MYSOLINE Take 200 mg by mouth 2 (two) times  daily. 4 tabs   RISA-BID PROBIOTIC Tabs Take 1 tablet by mouth daily.   spironolactone 25 MG tablet Commonly known as:  ALDACTONE Take 1 tablet by mouth daily.   torsemide 20 MG tablet Commonly known as:  DEMADEX Take 40 mg by mouth daily. 2 tabs   traMADol 50 MG tablet Commonly known as:  ULTRAM Take 1 tablet (50 mg total) by mouth every 4 (four) hours as needed.   umeclidinium-vilanterol 62.5-25 MCG/INH Aepb Commonly known as:  ANORO ELLIPTA Inhale 1 puff into the lungs daily.       Review of Systems  Constitutional: Negative for activity change, appetite change, chills, diaphoresis and fever.  HENT: Negative for congestion, mouth sores, nosebleeds, postnasal drip, sneezing, sore throat, trouble swallowing and voice change.   Respiratory: Negative for apnea, cough, choking, chest tightness, shortness of breath and wheezing.   Cardiovascular: Negative for chest pain, palpitations and leg swelling.  Gastrointestinal: Negative for abdominal distention, abdominal pain, constipation, diarrhea and nausea.  Genitourinary: Negative for difficulty urinating, dysuria, frequency and urgency.  Musculoskeletal: Positive for arthralgias (typical arthritis). Negative for back pain, gait problem and myalgias.  Skin: Positive for wound. Negative for color change, pallor  and rash.  Neurological: Positive for weakness. Negative for dizziness, tremors, syncope, speech difficulty, numbness and headaches.  Psychiatric/Behavioral: Negative for agitation and behavioral problems.  All other systems reviewed and are negative.   Vitals:   09/24/17 0806  BP: 127/78  Pulse: 67  Resp: 20  Temp: 97.9 F (36.6 C)  TempSrc: Oral  SpO2: 97%  Weight: (!) 305 lb 12.8 oz (138.7 kg)  Height: 5\' 9"  (1.753 m)   Body mass index is 45.16 kg/m. Physical Exam  Constitutional: He is oriented to person, place, and time. Vital signs are normal. He appears well-developed and well-nourished. He is active and cooperative. He does not appear ill. No distress.  HENT:  Head: Normocephalic and atraumatic.  Mouth/Throat: Uvula is midline, oropharynx is clear and moist and mucous membranes are normal. Mucous membranes are not pale, not dry and not cyanotic.  Eyes: Conjunctivae, EOM and lids are normal. Pupils are equal, round, and reactive to light.  Neck: Trachea normal, normal range of motion and full passive range of motion without pain. Neck supple. No JVD present. No tracheal deviation, no edema and no erythema present. No thyromegaly present.  Cardiovascular: Normal rate, regular rhythm, normal heart sounds, intact distal pulses and normal pulses. Exam reveals no gallop, no distant heart sounds and no friction rub.  No murmur heard. Pulses:      Dorsalis pedis pulses are 2+ on the right side, and 2+ on the left side.  2+ BLE pitting edema  Pulmonary/Chest: Effort normal and breath sounds normal. No accessory muscle usage. No respiratory distress. He has no decreased breath sounds. He has no wheezes. He has no rhonchi. He has no rales. He exhibits no tenderness.  Abdominal: Soft. Normal appearance and bowel sounds are normal. He exhibits no distension and no ascites. There is no tenderness.  Musculoskeletal: Normal range of motion. He exhibits no edema or tenderness.  Expected  osteoarthritis, stiffness; Bilateral Calves soft, supple. Negative Homan's Sign. B- pedal pulses equal; generalized weakness and deconditioning  Neurological: He is alert and oriented to person, place, and time. He has normal strength. Coordination and gait abnormal.  Skin: Skin is warm, dry and intact. He is not diaphoretic. No cyanosis. No pallor. Nails show no clubbing.  BLE in [employer]  Psychiatric: He  has a normal mood and affect. His speech is normal and behavior is normal. Judgment and thought content normal. Cognition and memory are normal.  Nursing note and vitals reviewed.   Labs reviewed: Basic Metabolic Panel: Recent Labs    08/31/17 0259 09/02/17 0426 09/08/17 0400  NA 144 141 140  K 3.4* 3.7 3.8  CL 104 99* 104  CO2 32 31 24  GLUCOSE 117* 123* 119*  BUN 39* 41* 47*  CREATININE 1.56* 1.51* 1.45*  CALCIUM 9.0 8.5* 8.2*  MG  --   --  2.1   Liver Function Tests: Recent Labs    08/31/17 0259 09/08/17 0400  AST 46* 61*  ALT 45 96*  ALKPHOS 105 110  BILITOT 0.8 0.9  PROT 7.1 5.9*  ALBUMIN 3.8 2.9*   No results for input(s): LIPASE, AMYLASE in the last 8760 hours. No results for input(s): AMMONIA in the last 8760 hours. CBC: Recent Labs    08/31/17 0259 09/04/17 0442 09/08/17 0930  WBC 7.9 8.3 11.8*  NEUTROABS  --   --  8.0*  HGB 14.4 12.2* 13.5  HCT 42.6 36.3* 40.6  MCV 94.4 94.7 95.0  PLT 153 146* 208   Cardiac Enzymes: No results for input(s): CKTOTAL, CKMB, CKMBINDEX, TROPONINI in the last 8760 hours. BNP: Invalid input(s): POCBNP CBG: No results for input(s): GLUCAP in the last 8760 hours.  Procedures and Imaging Studies During Stay: Dg Chest 1 View  Result Date: 09/02/2017 CLINICAL DATA:  Cough EXAM: CHEST 1 VIEW COMPARISON:  08/31/2017 FINDINGS: Cardiac shadow is enlarged but stable. Aortic calcifications are again seen. The lungs are well aerated bilaterally. No focal infiltrate is noted. No bony abnormality is seen. IMPRESSION: No active  disease. Electronically Signed   By: Alcide Clever M.D.   On: 09/02/2017 09:58   Dg Chest 2 View  Result Date: 08/31/2017 CLINICAL DATA:  82 year old male with cough. EXAM: CHEST  2 VIEW COMPARISON:  Chest radiograph dated 06/10/2016 FINDINGS: Mild bibasilar atelectatic changes. There is diffuse interstitial coarsening. No focal consolidation, pleural effusion, or pneumothorax. Mild cardiomegaly. No acute osseous pathology. IMPRESSION: 1. No acute cardiopulmonary process. 2. Mild cardiomegaly and chronic interstitial coarsening. Electronically Signed   By: Elgie Collard M.D.   On: 08/31/2017 03:50   Dg Tibia/fibula Right  Result Date: 08/31/2017 CLINICAL DATA:  82 year old male with fall and right lower extremity pain. EXAM: RIGHT TIBIA AND FIBULA - 2 VIEW COMPARISON:  Right femur radiograph dated 06/04/2010 FINDINGS: There is no acute fracture or dislocation. Mild osteopenia. Stable sclerotic changes of the distal femur most consistent with an enchondroma. There is mild arthritic changes of the knee with meniscal calcinosis. Focal soft tissue hematoma noted in the anterolateral soft tissues of the mid calf. Small calcifications of the skin likely related to chronic venous stasis. IMPRESSION: 1. No acute fracture or dislocation. 2. Nonacute findings as described above. Electronically Signed   By: Elgie Collard M.D.   On: 08/31/2017 03:53   Dg Knee 4 Views W/patella Left  Result Date: 09/01/2017 CLINICAL DATA:  Left knee pain and limited range of motion. Weakness. No known injury. EXAM: LEFT KNEE - COMPLETE 4+ VIEW COMPARISON:  08/08/2016. FINDINGS: Marked lateral joint space narrowing with mild progression. Spur formation involving all 3 joint compartments. Medial meniscal calcification. No effusion seen. Diffuse subcutaneous edema. IMPRESSION: 1. Tricompartmental degenerative changes, most pronounced involving the lateral compartment. 2. Chondrocalcinosis. 3. Diffuse subcutaneous edema.  Electronically Signed   By: Beckie Salts M.D.   On:  09/01/2017 07:43    Assessment/Plan:   1. Congestive heart failure, unspecified HF chronicity, unspecified heart failure type (HCC)  Continue Torsemide 40 mg po Q Day  Continue Spironolactone 25 mg po Q Day  Follow up with Cardiologist asap after discharge for continuity of care  2. Chronic venous insufficiency 3. Hematoma of leg, right, subsequent encounter 4. Blood blister  UNNA boots to BLE  Change weekly by [facility]  Elevate legs when at rest  Edema control  Follow up with Vascular MD asap after discharge for continuity of care  5. Protein-calorie malnutrition, unspecified severity (HCC)  Continue Pro-Stat 30 mL PO BID for wound healing, low protein. Low albumin  Continue Ensure Enlive 1 bottle po BID for wound healing, low protein, low albumin  Follow up with PCP asap after discharge for medication management and f/u  6. Generalized weakness  Continue PT/OT  Continue exercises as taught by PT/OT  Ambulate with assistance  Follow up with PCP asap after discharge for continuity of care   Patient is being discharged with the following home health services: [facility] PT/OT/NSG/aide through [facility]   Patient is being discharged with the following durable medical equipment: nebulizer    Patient has been advised to f/u with their PCP in 1-2 weeks to bring them up to date on their rehab stay.  Social services at facility was responsible for arranging this appointment.  Pt was provided with a 30 day supply of prescriptions for medications and refills must be obtained from their PCP.  For controlled substances, a more limited supply may be provided adequate until PCP appointment only.  Future labs/tests needed:    Family/ staff Communication:   Total Time:  Documentation:  Face to Face:  Family/Phone:  Brynda Rim, NP-C Geriatrics [facility] [address], [place] [address] Cell Phone (Mon-Fri 8am-5pm):  [phone] On Call:  [phone] & follow prompts after 5pm & weekends Office Phone:  [phone] Office Fax:  [phone]

## 2017-09-30 ENCOUNTER — Ambulatory Visit (INDEPENDENT_AMBULATORY_CARE_PROVIDER_SITE_OTHER): Payer: Medicare Other | Admitting: Vascular Surgery

## 2017-09-30 ENCOUNTER — Encounter (INDEPENDENT_AMBULATORY_CARE_PROVIDER_SITE_OTHER): Payer: Self-pay

## 2017-09-30 VITALS — BP 156/71 | HR 66 | Resp 18 | Ht 70.0 in | Wt 300.0 lb

## 2017-09-30 DIAGNOSIS — I83012 Varicose veins of right lower extremity with ulcer of calf: Secondary | ICD-10-CM | POA: Diagnosis not present

## 2017-09-30 DIAGNOSIS — L97211 Non-pressure chronic ulcer of right calf limited to breakdown of skin: Secondary | ICD-10-CM

## 2017-09-30 DIAGNOSIS — I890 Lymphedema, not elsewhere classified: Secondary | ICD-10-CM

## 2017-09-30 NOTE — Progress Notes (Signed)
History of Present Illness  There is no documented history at this time  Assessments & Plan   There are no diagnoses linked to this encounter.    Additional instructions  Subjective:  Patient presents with venous ulcer of the Bilateral lower extremity.    Procedure:  3 layer unna wrap was placed Bilateral lower extremity.   Plan:   Follow up in one week.  

## 2017-10-07 ENCOUNTER — Encounter (INDEPENDENT_AMBULATORY_CARE_PROVIDER_SITE_OTHER): Payer: Medicare Other

## 2017-10-14 ENCOUNTER — Ambulatory Visit (INDEPENDENT_AMBULATORY_CARE_PROVIDER_SITE_OTHER): Payer: Medicare Other | Admitting: Vascular Surgery

## 2017-10-16 ENCOUNTER — Encounter (INDEPENDENT_AMBULATORY_CARE_PROVIDER_SITE_OTHER): Payer: Self-pay | Admitting: Vascular Surgery

## 2017-10-16 ENCOUNTER — Ambulatory Visit (INDEPENDENT_AMBULATORY_CARE_PROVIDER_SITE_OTHER): Payer: Medicare Other | Admitting: Vascular Surgery

## 2017-10-16 VITALS — BP 142/76 | HR 56 | Resp 17 | Ht 70.0 in | Wt 295.0 lb

## 2017-10-16 DIAGNOSIS — S8011XD Contusion of right lower leg, subsequent encounter: Secondary | ICD-10-CM | POA: Diagnosis not present

## 2017-10-16 DIAGNOSIS — I890 Lymphedema, not elsewhere classified: Secondary | ICD-10-CM

## 2017-10-16 MED ORDER — CEPHALEXIN 500 MG PO CAPS: 500.0000 mg | ORAL_CAPSULE | Freq: Four times a day (QID) | ORAL | 0 refills | Status: DC

## 2017-10-16 NOTE — Progress Notes (Signed)
Subjective:    Patient ID: Dylan [name], male    DOB: 09-16-1935, 82 y.o.   MRN: [id number] Chief Complaint  Patient presents with  . Follow-up    Unna check   Patient presents for a monthly lymphedema / wound follow-up.  The patient has a hematoma status post a fall to the right lateral calf.  Patient seen with wife.  The patient has noted minimal improvement in the size of his hematoma.  The patient has noted an improvement to the lymphedema exacerbation noted to the bilateral lower extremity.  The patient denies any ulceration to the hematoma area.  The patient denies any erythema to the right lower extremity.  The patient is scheduled to undergo a right total knee replacement in March 2019.  The patient denies any worsening pain to the bilateral lower extremity.  Patient denies any new ulceration formation to the bilateral lower extremity.  Patient denies any fever, nausea vomiting.   Review of Systems  Constitutional: Negative.   HENT: Negative.   Eyes: Negative.   Respiratory: Negative.   Cardiovascular: Positive for leg swelling.       Right Calf Hemtoma  Gastrointestinal: Negative.   Endocrine: Negative.   Genitourinary: Negative.   Musculoskeletal: Negative.   Skin: Negative.   Allergic/Immunologic: Negative.   Neurological: Negative.   Hematological: Negative.   Psychiatric/Behavioral: Negative.       Objective:   Physical Exam  Constitutional: He is oriented to person, place, and time. He appears well-developed and well-nourished. No distress.  HENT:  Head: Normocephalic and atraumatic.  Eyes: Conjunctivae are normal. Pupils are equal, round, and reactive to light.  Neck: Normal range of motion.  Cardiovascular: Normal rate, regular rhythm, normal heart sounds and intact distal pulses.  Pulses:      Radial pulses are 2+ on the right side, and 2+ on the left side.       Dorsalis pedis pulses are 1+ on the right side, and 1+ on the left side.       Posterior tibial  pulses are 1+ on the right side, and 1+ on the left side.  Right lower extremity: Large hematoma noted to the right lateral calf.  There is a small wound noted mid hematoma.  Slightly erythematous.  Pulmonary/Chest: Effort normal and breath sounds normal.  Musculoskeletal: Normal range of motion. He exhibits edema (Mild lower extremity nonpitting edema noted bilaterally).  Neurological: He is alert and oriented to person, place, and time.  Skin: He is not diaphoretic.  Psychiatric: He has a normal mood and affect. His behavior is normal. Judgment and thought content normal.  Vitals reviewed.  BP (!) 142/76 (BP Location: Right Arm, Patient Position: Sitting)   Pulse (!) 56   Resp 17   Ht 5\' 10"  (1.778 m)   Wt 295 lb (133.8 kg)   BMI 42.33 kg/m   Past Medical History:  Diagnosis Date  . CAD (coronary artery disease)   . Cervicalgia   . CHF (congestive heart failure) (HCC)   . Diastolic heart failure (HCC)   . Foot drop, right   . Hyperlipidemia    unspecified  . Hypertension   . Myocardial infarction (HCC)   . Osteoarthritis   . Shoulder pain, left   . Tremor, essential    Social History   Socioeconomic History  . Marital status: Married    Spouse name: Dylan [name]  . Number of children: 2  . Years of education: 6511  . Highest education level:  11th grade  Social Needs  . Financial resource strain: Not on file  . Food insecurity - worry: Not on file  . Food insecurity - inability: Not on file  . Transportation needs - medical: Not on file  . Transportation needs - non-medical: Not on file  Occupational History  . Occupation: retired  Tobacco Use  . Smoking status: Former Smoker    Packs/day: 0.25    Years: 58.00    Pack years: 14.50    Types: Cigarettes  . Smokeless tobacco: Never Used  Substance and Sexual Activity  . Alcohol use: No    Alcohol/week: 0.0 oz  . Drug use: No  . Sexual activity: Not on file  Other Topics Concern  . Not on file  Social History  Narrative   Full Code with HCPOA and living will   Married with 2 children   Former smoker   Alcohol - none   Smokeless tobacco - none   Past Surgical History:  Procedure Laterality Date  . BACK SURGERY  06/2010  . CARDIAC CATHETERIZATION Left 04/30/2016   Procedure: Left Heart Cath and Coronary Angiography;  Surgeon: Laurier Nancy, MD;  Location: [facility] INVASIVE CV [facility];  Service: Cardiovascular;  Laterality: Left;  . CORONARY ANGIOPLASTY    . KNEE ARTHROSCOPY    . KNEE SURGERY Left 1998   Family History  Problem Relation Age of Onset  . Diabetes Son   . Cancer Mother   . Colon cancer Mother   . Lung cancer Mother   . Tremor Father   . Tremor Brother    No Known Allergies     Assessment & Plan:  Patient presents for a monthly lymphedema / wound follow-up.  The patient has a hematoma status post a fall to the right lateral calf.  Patient seen with wife.  The patient has noted minimal improvement in the size of his hematoma.  The patient has noted an improvement to the lymphedema exacerbation noted to the bilateral lower extremity.  The patient denies any ulceration to the hematoma area.  The patient denies any erythema to the right lower extremity.  The patient is scheduled to undergo a right total knee replacement in March 2019.  The patient denies any worsening pain to the bilateral lower extremity.  Patient denies any new ulceration formation to the bilateral lower extremity.  Patient denies any fever, nausea vomiting.  1. Hematoma of leg, right, subsequent encounter - Stable Minimal improvement to the size of the hematoma status post one month of unna wrap therapy There is a small wound to the top of the hematoma I gently probed this with a sterile Q-tip and was able to apply gentle pressure to the hematoma expressing nonpurulent old blood from the area. Keflex 500 mg 1 tab every 6 hours x 10 days I will place the patient in bilateral 3 layer zinc oxide Unna wrap therapy for  another month with weekly changes The patient is to follow-up with me in 1 week to assess the hematoma site The patient should call the office if he should experience any increasing pain to the hematoma area, fever, nausea vomiting. The patient and his wife expressed their understanding  2. Lymphedema - Stable As above  Current Outpatient Medications on File Prior to Visit  Medication Sig Dispense Refill  . acetaminophen (TYLENOL) 325 MG tablet Take 650 mg by mouth every 4 (four) hours as needed.    Marland Kitchen alum & mag hydroxide-simeth (MAALOX/MYLANTA) 200-200-20 MG/5ML suspension Take  15 mLs by mouth every 6 (six) hours as needed for indigestion. 355 mL 0  . Amino Acids-Protein Hydrolys (FEEDING SUPPLEMENT, PRO-STAT SUGAR FREE 64,) LIQD Take 30 mLs by mouth 2 (two) times daily between meals.    Marland Kitchen aspirin EC 81 MG tablet Take 81 mg by mouth daily.    Marland Kitchen atorvastatin (LIPITOR) 10 MG tablet Take 10 mg by mouth daily.    . carvedilol (COREG) 12.5 MG tablet Take 12.5 mg by mouth 2 (two) times daily.    Marland Kitchen docusate sodium (COLACE) 100 MG capsule Take 200 mg by mouth at bedtime.     . gabapentin (NEURONTIN) 400 MG capsule Take 400 mg by mouth 3 (three) times daily.    Marland Kitchen guaiFENesin-dextromethorphan (ROBITUSSIN DM) 100-10 MG/5ML syrup Take 5 mLs by mouth every 4 (four) hours as needed for cough. 118 mL 0  . hydrALAZINE (APRESOLINE) 100 MG tablet Take 1 tablet (100 mg total) by mouth 2 (two) times daily.    . Infant Care Products (DERMACLOUD) CREA Apply liberal amount topically to area of skin irritation as needed. Ok to leave at bedside.    Marland Kitchen ipratropium-albuterol (DUONEB) 0.5-2.5 (3) MG/3ML SOLN Take 3 mLs by nebulization every 6 (six) hours as needed.    . isosorbide mononitrate (IMDUR) 30 MG 24 hr tablet Take 30 mg by mouth daily.     Marland Kitchen lisinopril (PRINIVIL,ZESTRIL) 5 MG tablet Take 5 mg by mouth daily.     . Multiple Vitamins-Minerals (CENTROVITE) TABS Take 1 tablet by mouth daily.    . nitroGLYCERIN  (NITROSTAT) 0.3 MG SL tablet Place 0.3 mg under the tongue every 5 (five) minutes as needed for chest pain.    . Nutritional Supplements (ENSURE ENLIVE PO) Take 1 Bottle by mouth 2 (two) times daily between meals.    . primidone (MYSOLINE) 50 MG tablet Take 200 mg by mouth 2 (two) times daily. 4 tabs    . Probiotic Product (RISA-BID PROBIOTIC) TABS Take 1 tablet by mouth daily.    . Skin Protectants, Misc. (ENDIT EX) Apply topically to sacrum/buttocks 2 times daily and as needed    . spironolactone (ALDACTONE) 25 MG tablet Take 1 tablet by mouth daily.     Marland Kitchen torsemide (DEMADEX) 20 MG tablet Take 40 mg by mouth daily. 2 tabs    . traMADol (ULTRAM) 50 MG tablet Take 1 tablet (50 mg total) by mouth every 4 (four) hours as needed. 60 tablet 0  . umeclidinium-vilanterol (ANORO ELLIPTA) 62.5-25 MCG/INH AEPB Inhale 1 puff into the lungs daily.     No current facility-administered medications on file prior to visit.    There are no Patient Instructions on file for this visit. No Follow-up on file.  Tyresa Prindiville A Jayvian Escoe, PA-C

## 2017-10-23 ENCOUNTER — Ambulatory Visit (INDEPENDENT_AMBULATORY_CARE_PROVIDER_SITE_OTHER): Payer: Medicare Other | Admitting: Vascular Surgery

## 2017-10-23 ENCOUNTER — Encounter (INDEPENDENT_AMBULATORY_CARE_PROVIDER_SITE_OTHER): Payer: Self-pay | Admitting: Vascular Surgery

## 2017-10-23 VITALS — BP 141/67 | HR 78 | Resp 18 | Ht 70.0 in | Wt 295.0 lb

## 2017-10-23 DIAGNOSIS — S8011XD Contusion of right lower leg, subsequent encounter: Secondary | ICD-10-CM

## 2017-10-23 DIAGNOSIS — I890 Lymphedema, not elsewhere classified: Secondary | ICD-10-CM

## 2017-10-24 ENCOUNTER — Encounter (INDEPENDENT_AMBULATORY_CARE_PROVIDER_SITE_OTHER): Payer: Self-pay | Admitting: Vascular Surgery

## 2017-10-24 NOTE — Progress Notes (Signed)
Subjective:    Patient ID: Dylan [name], male    DOB: 09/19/1935, 82 y.o.   MRN: [id number] Chief Complaint  Patient presents with  . Follow-up    1 week f/u unna check   The patient presents for a one-week right lower extremity hematoma check.  The patient has been in bilateral 3 layer zinc oxide Unna wraps for a lymphedema exacerbation and hematoma to the right lower extremity after a fall.  Last week I was able to express a large amount of old blood from the hematoma site.  The patient notes that his visiting nurse was also able to express additional fluid from the hematoma site on Monday.  The patient presents today with an improvement to the size of the hematoma site.  The patient is almost finished with his course of Keflex I prescribed to him last week.  The patient denies any worsening opening of the bilateral lower extremity edema.  The patient denies any erythema to the bilateral lower extremity.  Patient denies any new ulcer formation to the bilateral lower extremity.  The patient denies any fever, nausea vomiting.   Review of Systems  Constitutional: Negative.   HENT: Negative.   Eyes: Negative.   Respiratory: Negative.   Cardiovascular: Positive for leg swelling.  Gastrointestinal: Negative.   Endocrine: Negative.   Genitourinary: Negative.   Musculoskeletal: Negative.   Skin: Positive for wound.  Allergic/Immunologic: Negative.   Neurological: Negative.   Hematological: Negative.   Psychiatric/Behavioral: Negative.       Objective:   Physical Exam  Constitutional: He is oriented to person, place, and time. He appears well-developed and well-nourished. No distress.  HENT:  Head: Normocephalic and atraumatic.  Eyes: Conjunctivae are normal. Pupils are equal, round, and reactive to light.  Neck: Normal range of motion.  Cardiovascular: Normal rate, regular rhythm, normal heart sounds and intact distal pulses.  Pulses:      Radial pulses are 2+ on the right side, and  2+ on the left side.  Pulmonary/Chest: Effort normal and breath sounds normal.  Musculoskeletal: He exhibits no edema (There is minimal to no edema noted to the bilateral lower extremity).  Neurological: He is alert and oriented to person, place, and time.  Skin: Skin is warm and dry. He is not diaphoretic.  Right lower extremity: The hematoma site is almost completely resolved.  There is a 2 cm x 2 cm shallow ulceration noted to the lateral aspect of that hematoma site.  This is limited to the breakdown of skin.  There is no drainage.  There is no surrounding erythema.  This is nontender to palpation.  The wound bed has granulation tissue noted.  Psychiatric: He has a normal mood and affect. His behavior is normal. Judgment and thought content normal.  Vitals reviewed.  BP (!) 141/67 (BP Location: Right Arm)   Pulse 78   Resp 18   Ht 5\' 10"  (1.778 m)   Wt 295 lb (133.8 kg)   BMI 42.33 kg/m   Past Medical History:  Diagnosis Date  . CAD (coronary artery disease)   . Cervicalgia   . CHF (congestive heart failure) (HCC)   . Diastolic heart failure (HCC)   . Foot drop, right   . Hyperlipidemia    unspecified  . Hypertension   . Myocardial infarction (HCC)   . Osteoarthritis   . Shoulder pain, left   . Tremor, essential    Social History   Socioeconomic History  . Marital status:  Married    Spouse name: Eber JonesCarolyn  . Number of children: 2  . Years of education: 7111  . Highest education level: 11th grade  Social Needs  . Financial resource strain: Not on file  . Food insecurity - worry: Not on file  . Food insecurity - inability: Not on file  . Transportation needs - medical: Not on file  . Transportation needs - non-medical: Not on file  Occupational History  . Occupation: retired  Tobacco Use  . Smoking status: Former Smoker    Packs/day: 0.25    Years: 58.00    Pack years: 14.50    Types: Cigarettes  . Smokeless tobacco: Never Used  Substance and Sexual Activity  .  Alcohol use: No    Alcohol/week: 0.0 oz  . Drug use: No  . Sexual activity: Not on file  Other Topics Concern  . Not on file  Social History Narrative   Full Code with HCPOA and living will   Married with 2 children   Former smoker   Alcohol - none   Smokeless tobacco - none   Past Surgical History:  Procedure Laterality Date  . BACK SURGERY  06/2010  . CARDIAC CATHETERIZATION Left 04/30/2016   Procedure: Left Heart Cath and Coronary Angiography;  Surgeon: Laurier NancyShaukat A Khan, MD;  Location: [facility] INVASIVE CV LAB;  Service: Cardiovascular;  Laterality: Left;  . CORONARY ANGIOPLASTY    . KNEE ARTHROSCOPY    . KNEE SURGERY Left 1998   Family History  Problem Relation Age of Onset  . Diabetes Son   . Cancer Mother   . Colon cancer Mother   . Lung cancer Mother   . Tremor Father   . Tremor Brother    No Known Allergies     Assessment & Plan:  The patient presents for a one-week right lower extremity hematoma check.  The patient has been in bilateral 3 layer zinc oxide Unna wraps for a lymphedema exacerbation and hematoma to the right lower extremity after a fall.  Last week I was able to express a large amount of old blood from the hematoma site.  The patient notes that his visiting nurse was also able to express additional fluid from the hematoma site on Monday.  The patient presents today with an improvement to the size of the hematoma site.  The patient is almost finished with his course of Keflex I prescribed to him last week.  The patient denies any worsening opening of the bilateral lower extremity edema.  The patient denies any erythema to the bilateral lower extremity.  Patient denies any new ulcer formation to the bilateral lower extremity.  The patient denies any fever, nausea vomiting.  1. Hematoma of leg, right, subsequent encounter - Stable After evacuation of the hematoma in the office last week and his home visiting nurse on Monday the hematoma is practically  resolved. There is a small 2 cm x 2 cm very shallow wound on ulceration noted to the lateral aspect of the right calf where the hematoma was Recommend continued 3 layer zinc oxide Unna wrap to that area until the ulceration is healed The patient states that he has a medical grade 1 compression stocking for the left lower extremity. In my opinion his lymphedema is now controlled and there are no ulcerations to the left lower extremity.  I do not recommend continued Unna boot therapy to the left lower extremity however his wife who states that she "is in the medical field" is  insisting both legs continue to be wrapped. The patient to follow-up in 1 month to assess the progress he is making in Radio broadcast assistant therapy  2. Lymphedema - Stable As above  Current Outpatient Medications on File Prior to Visit  Medication Sig Dispense Refill  . acetaminophen (TYLENOL) 325 MG tablet Take 650 mg by mouth every 4 (four) hours as needed.    Marland Kitchen alum & mag hydroxide-simeth (MAALOX/MYLANTA) 200-200-20 MG/5ML suspension Take 15 mLs by mouth every 6 (six) hours as needed for indigestion. 355 mL 0  . Amino Acids-Protein Hydrolys (FEEDING SUPPLEMENT, PRO-STAT SUGAR FREE 64,) LIQD Take 30 mLs by mouth 2 (two) times daily between meals.    Marland Kitchen aspirin EC 81 MG tablet Take 81 mg by mouth daily.    Marland Kitchen atorvastatin (LIPITOR) 10 MG tablet Take 10 mg by mouth daily.    . carvedilol (COREG) 12.5 MG tablet Take 12.5 mg by mouth 2 (two) times daily.    . cephALEXin (KEFLEX) 500 MG capsule Take 1 capsule (500 mg total) by mouth 4 (four) times daily. 40 capsule 0  . docusate sodium (COLACE) 100 MG capsule Take 200 mg by mouth at bedtime.     . gabapentin (NEURONTIN) 400 MG capsule Take 400 mg by mouth 3 (three) times daily.    Marland Kitchen guaiFENesin-dextromethorphan (ROBITUSSIN DM) 100-10 MG/5ML syrup Take 5 mLs by mouth every 4 (four) hours as needed for cough. 118 mL 0  . hydrALAZINE (APRESOLINE) 100 MG tablet Take 1 tablet (100 mg total) by  mouth 2 (two) times daily.    . hydrochlorothiazide (HYDRODIURIL) 25 MG tablet     . Infant Care Products (DERMACLOUD) CREA Apply liberal amount topically to area of skin irritation as needed. Ok to leave at bedside.    Marland Kitchen ipratropium-albuterol (DUONEB) 0.5-2.5 (3) MG/3ML SOLN Take 3 mLs by nebulization every 6 (six) hours as needed.    . isosorbide mononitrate (IMDUR) 30 MG 24 hr tablet Take 30 mg by mouth daily.     Marland Kitchen lisinopril (PRINIVIL,ZESTRIL) 5 MG tablet Take 5 mg by mouth daily.     . Multiple Vitamins-Minerals (CENTROVITE) TABS Take 1 tablet by mouth daily.    . nitroGLYCERIN (NITROSTAT) 0.3 MG SL tablet Place 0.3 mg under the tongue every 5 (five) minutes as needed for chest pain.    . Nutritional Supplements (ENSURE ENLIVE PO) Take 1 Bottle by mouth 2 (two) times daily between meals.    . primidone (MYSOLINE) 50 MG tablet Take 200 mg by mouth 2 (two) times daily. 4 tabs    . Probiotic Product (RISA-BID PROBIOTIC) TABS Take 1 tablet by mouth daily.    . Skin Protectants, Misc. (ENDIT EX) Apply topically to sacrum/buttocks 2 times daily and as needed    . spironolactone (ALDACTONE) 25 MG tablet Take 1 tablet by mouth daily.     Marland Kitchen torsemide (DEMADEX) 20 MG tablet Take 40 mg by mouth daily. 2 tabs    . traMADol (ULTRAM) 50 MG tablet Take 1 tablet (50 mg total) by mouth every 4 (four) hours as needed. 60 tablet 0  . umeclidinium-vilanterol (ANORO ELLIPTA) 62.5-25 MCG/INH AEPB Inhale 1 puff into the lungs daily.     No current facility-administered medications on file prior to visit.    There are no Patient Instructions on file for this visit. No Follow-up on file.  Zenobia Kuennen A Kasheena Sambrano, PA-C

## 2017-10-28 ENCOUNTER — Ambulatory Visit (INDEPENDENT_AMBULATORY_CARE_PROVIDER_SITE_OTHER): Payer: Medicare Other | Admitting: Vascular Surgery

## 2017-10-30 ENCOUNTER — Encounter (INDEPENDENT_AMBULATORY_CARE_PROVIDER_SITE_OTHER): Payer: Self-pay

## 2017-10-30 ENCOUNTER — Ambulatory Visit (INDEPENDENT_AMBULATORY_CARE_PROVIDER_SITE_OTHER): Payer: Medicare Other | Admitting: Vascular Surgery

## 2017-10-30 VITALS — BP 145/72 | HR 53 | Resp 16

## 2017-10-30 DIAGNOSIS — L97211 Non-pressure chronic ulcer of right calf limited to breakdown of skin: Secondary | ICD-10-CM | POA: Diagnosis not present

## 2017-10-30 DIAGNOSIS — I83012 Varicose veins of right lower extremity with ulcer of calf: Secondary | ICD-10-CM | POA: Diagnosis not present

## 2017-10-30 NOTE — Progress Notes (Signed)
History of Present Illness  There is no documented history at this time  Assessments & Plan   There are no diagnoses linked to this encounter.    Additional instructions  Subjective:  Patient presents with venous ulcer of the Right lower extremity.    Procedure:  3 layer unna wrap was placed Right lower extremity.   Plan:   Follow up in one week.   

## 2017-10-31 ENCOUNTER — Encounter (INDEPENDENT_AMBULATORY_CARE_PROVIDER_SITE_OTHER): Payer: Self-pay | Admitting: Vascular Surgery

## 2017-11-07 ENCOUNTER — Encounter (INDEPENDENT_AMBULATORY_CARE_PROVIDER_SITE_OTHER): Payer: Self-pay

## 2017-11-07 ENCOUNTER — Ambulatory Visit (INDEPENDENT_AMBULATORY_CARE_PROVIDER_SITE_OTHER): Payer: Medicare Other | Admitting: Vascular Surgery

## 2017-11-07 VITALS — BP 131/69 | HR 54 | Resp 16

## 2017-11-07 DIAGNOSIS — L97211 Non-pressure chronic ulcer of right calf limited to breakdown of skin: Secondary | ICD-10-CM | POA: Diagnosis not present

## 2017-11-07 DIAGNOSIS — I83012 Varicose veins of right lower extremity with ulcer of calf: Secondary | ICD-10-CM

## 2017-11-07 NOTE — Progress Notes (Signed)
History of Present Illness  There is no documented history at this time  Assessments & Plan   There are no diagnoses linked to this encounter.    Additional instructions  Subjective:  Patient presents with venous ulcer of the Right lower extremity.    Procedure:  3 layer unna wrap was placed Right lower extremity.   Plan:   Follow up in one week.    There is significant tracking medially and superiorly creating a large pocket.  This will need to be debrided and a negative pressure dressing placed.  This can be done next week.  I am going to start him on antibiotics as well

## 2017-11-10 ENCOUNTER — Other Ambulatory Visit (INDEPENDENT_AMBULATORY_CARE_PROVIDER_SITE_OTHER): Payer: Self-pay | Admitting: Vascular Surgery

## 2017-11-11 ENCOUNTER — Other Ambulatory Visit: Payer: Self-pay

## 2017-11-11 ENCOUNTER — Encounter
Admission: RE | Admit: 2017-11-11 | Discharge: 2017-11-11 | Disposition: A | Discharge status: Home / Self Care | Payer: Medicare Other | Source: Ambulatory Visit | Attending: Vascular Surgery | Admitting: Vascular Surgery

## 2017-11-11 DIAGNOSIS — I2510 Atherosclerotic heart disease of native coronary artery without angina pectoris: Secondary | ICD-10-CM | POA: Diagnosis not present

## 2017-11-11 DIAGNOSIS — I110 Hypertensive heart disease with heart failure: Secondary | ICD-10-CM | POA: Diagnosis not present

## 2017-11-11 DIAGNOSIS — J449 Chronic obstructive pulmonary disease, unspecified: Secondary | ICD-10-CM | POA: Diagnosis not present

## 2017-11-11 DIAGNOSIS — L97215 Non-pressure chronic ulcer of right calf with muscle involvement without evidence of necrosis: Secondary | ICD-10-CM | POA: Diagnosis present

## 2017-11-11 DIAGNOSIS — Z6841 Body Mass Index (BMI) 40.0 and over, adult: Secondary | ICD-10-CM | POA: Diagnosis not present

## 2017-11-11 DIAGNOSIS — E785 Hyperlipidemia, unspecified: Secondary | ICD-10-CM | POA: Diagnosis not present

## 2017-11-11 DIAGNOSIS — Z79899 Other long term (current) drug therapy: Secondary | ICD-10-CM | POA: Diagnosis not present

## 2017-11-11 DIAGNOSIS — I5030 Unspecified diastolic (congestive) heart failure: Secondary | ICD-10-CM | POA: Diagnosis not present

## 2017-11-11 DIAGNOSIS — I252 Old myocardial infarction: Secondary | ICD-10-CM | POA: Diagnosis not present

## 2017-11-11 HISTORY — DX: Personal history of urinary calculi: Z87.442

## 2017-11-11 HISTORY — DX: Chronic obstructive pulmonary disease, unspecified: J44.9

## 2017-11-11 HISTORY — DX: Sleep apnea, unspecified: G47.30

## 2017-11-11 LAB — CBC WITH DIFFERENTIAL/PLATELET
Basophils Absolute: 0 10*3/uL (ref 0–0.1)
Basophils Relative: 1 %
Eosinophils Absolute: 0.5 10*3/uL (ref 0–0.7)
Eosinophils Relative: 6 %
HEMATOCRIT: 38.7 % — AB (ref 40.0–52.0)
Hemoglobin: 13 g/dL (ref 13.0–18.0)
LYMPHS PCT: 15 %
Lymphs Abs: 1.1 10*3/uL (ref 1.0–3.6)
MCH: 31.3 pg (ref 26.0–34.0)
MCHC: 33.6 g/dL (ref 32.0–36.0)
MCV: 93 fL (ref 80.0–100.0)
MONO ABS: 0.5 10*3/uL (ref 0.2–1.0)
MONOS PCT: 7 %
NEUTROS ABS: 5.6 10*3/uL (ref 1.4–6.5)
Neutrophils Relative %: 71 %
Platelets: 160 10*3/uL (ref 150–440)
RBC: 4.16 MIL/uL — ABNORMAL LOW (ref 4.40–5.90)
RDW: 15.3 % — AB (ref 11.5–14.5)
WBC: 7.7 10*3/uL (ref 3.8–10.6)

## 2017-11-11 LAB — BASIC METABOLIC PANEL
ANION GAP: 10 (ref 5–15)
BUN: 62 mg/dL — ABNORMAL HIGH (ref 6–20)
CO2: 27 mmol/L (ref 22–32)
Calcium: 8.9 mg/dL (ref 8.9–10.3)
Chloride: 99 mmol/L — ABNORMAL LOW (ref 101–111)
Creatinine, Ser: 2.21 mg/dL — ABNORMAL HIGH (ref 0.61–1.24)
GFR calc Af Amer: 31 mL/min — ABNORMAL LOW (ref >60)
GFR calc non Af Amer: 26 mL/min — ABNORMAL LOW (ref >60)
GLUCOSE: 124 mg/dL — AB (ref 65–99)
POTASSIUM: 3.6 mmol/L (ref 3.5–5.1)
Sodium: 136 mmol/L (ref 135–145)

## 2017-11-11 LAB — APTT: aPTT: 31 seconds (ref 24–36)

## 2017-11-11 LAB — PROTIME-INR
INR: 1.08
Prothrombin Time: 13.9 seconds (ref 11.4–15.2)

## 2017-11-11 LAB — TYPE AND SCREEN
ABO/RH(D): O NEG
Antibody Screen: NEGATIVE

## 2017-11-11 NOTE — Patient Instructions (Signed)
Your procedure is scheduled on: Thursday, November 13, 2017 Report [facility] To find out your arrival time, please call [phone] between 1PM - 3PM on: Wednesday, November 12, 2017  REMEMBER: Instructions that are not followed completely may result in serious medical risk, up to and including death; or upon the discretion of your surgeon and anesthesiologist your surgery may need to be rescheduled.  Do not eat food after midnight the night before your procedure.  No gum chewing, lozengers or hard candies.  You may however, drink CLEAR liquids up to 2 hours before you are scheduled to arrive for your surgery. Do not drink anything within 2 hours of the start of your surgery.  Clear liquids include: - water  - apple juice without pulp - clear gatorade - black coffee or tea (Do NOT add anything to the coffee or tea) Do NOT drink anything that is not on this list.  No Alcohol for 24 hours before or after surgery.  No Smoking including e-cigarettes for 24 hours prior to surgery.  No chewable tobacco products for at least 6 hours prior to surgery.  No nicotine patches on the day of surgery.  On the morning of surgery brush your teeth with toothpaste and water, you may rinse your mouth with mouthwash if you wish. Do not swallow any toothpaste or mouthwash.  Notify your doctor if there is any change in your medical condition (cold, fever, infection).  Do not wear jewelry, make-up, hairpins, clips or nail polish.  Do not wear lotions, powders, or perfumes. You may wear deodorant.  Do not shave 48 hours prior to surgery. Men may shave face and neck.  Contacts and dentures may not be worn into surgery.  Do not bring valuables to the hospital, including drivers license, insurance or credit cards.  [facility] is not responsible for any belongings or valuables.   TAKE THESE MEDICATIONS THE MORNING OF SURGERY:  1.  Carvedilol 2.  Keflex 3.   Gabapentin 4.  Isosorbide 5.  Spironolactone 6.  Anoro Ellipta inhaler 7.  Tramadol (if needed for pain) 8.  duoneb nebulizer  Use CHG wipes as directed on instruction sheet.  Use inhalers on the day of surgery and bring to the hospital.  Bring your C-PAP to the hospital with you in case you may have to spend the night.   Follow recommendations from Cardiologist or PCP regarding stopping Aspirin. STOP NOW!  NOW!  Stop Anti-inflammatories (NSAIDS) such as Advil, Aleve, Ibuprofen, Motrin, Naproxen, Naprosyn and Aspirin based products such as Excedrin, Goodys Powder, BC Powder. (May take Tylenol or Acetaminophen if needed.)  NOW!  Stop ANY OVER THE COUNTER supplements until after surgery. (May continue multivitamin.)  Wear comfortable clothing (specific to your surgery type) to the hospital.  Plan for stool softeners for home use.  If you are being discharged the day of surgery, you will not be allowed to drive home. You will need a responsible adult to drive you home and stay with you that night.   If you are taking public transportation, you will need to have a responsible adult with you. Please confirm with your physician that it is acceptable to use public transportation.   Please call [phone] if you have any questions about these instructions.

## 2017-11-12 ENCOUNTER — Telehealth (INDEPENDENT_AMBULATORY_CARE_PROVIDER_SITE_OTHER): Payer: Self-pay

## 2017-11-12 NOTE — Telephone Encounter (Signed)
Patient's wife called with questions regarding the patient's wound vac. I explained that after talking with [facility] per them they will deliver the vac to the hospital before his surgery.

## 2017-11-13 ENCOUNTER — Ambulatory Visit
Admission: RE | Admit: 2017-11-13 | Discharge: 2017-11-13 | Disposition: A | Discharge status: Home / Self Care | Payer: Medicare Other | Source: Ambulatory Visit | Attending: Vascular Surgery | Admitting: Vascular Surgery

## 2017-11-13 ENCOUNTER — Other Ambulatory Visit: Payer: Self-pay

## 2017-11-13 ENCOUNTER — Encounter: Payer: Self-pay | Admitting: Certified Registered Nurse Anesthetist

## 2017-11-13 ENCOUNTER — Encounter
Admission: RE | Disposition: A | Discharge status: Home / Self Care | Payer: Self-pay | Source: Ambulatory Visit | Attending: Vascular Surgery

## 2017-11-13 ENCOUNTER — Ambulatory Visit: Payer: Medicare Other | Admitting: Certified Registered Nurse Anesthetist

## 2017-11-13 DIAGNOSIS — E785 Hyperlipidemia, unspecified: Secondary | ICD-10-CM | POA: Insufficient documentation

## 2017-11-13 DIAGNOSIS — J449 Chronic obstructive pulmonary disease, unspecified: Secondary | ICD-10-CM | POA: Insufficient documentation

## 2017-11-13 DIAGNOSIS — Z79899 Other long term (current) drug therapy: Secondary | ICD-10-CM | POA: Insufficient documentation

## 2017-11-13 DIAGNOSIS — I110 Hypertensive heart disease with heart failure: Secondary | ICD-10-CM | POA: Insufficient documentation

## 2017-11-13 DIAGNOSIS — L97215 Non-pressure chronic ulcer of right calf with muscle involvement without evidence of necrosis: Secondary | ICD-10-CM | POA: Diagnosis not present

## 2017-11-13 DIAGNOSIS — Z6841 Body Mass Index (BMI) 40.0 and over, adult: Secondary | ICD-10-CM | POA: Insufficient documentation

## 2017-11-13 DIAGNOSIS — I5030 Unspecified diastolic (congestive) heart failure: Secondary | ICD-10-CM | POA: Insufficient documentation

## 2017-11-13 DIAGNOSIS — S81801A Unspecified open wound, right lower leg, initial encounter: Secondary | ICD-10-CM | POA: Diagnosis not present

## 2017-11-13 DIAGNOSIS — I252 Old myocardial infarction: Secondary | ICD-10-CM | POA: Insufficient documentation

## 2017-11-13 DIAGNOSIS — I2510 Atherosclerotic heart disease of native coronary artery without angina pectoris: Secondary | ICD-10-CM | POA: Insufficient documentation

## 2017-11-13 HISTORY — PX: APPLICATION OF WOUND VAC: SHX5189

## 2017-11-13 HISTORY — PX: WOUND DEBRIDEMENT: SHX247

## 2017-11-13 LAB — ABO/RH: ABO/RH(D): O NEG

## 2017-11-13 SURGERY — DEBRIDEMENT, WOUND
Anesthesia: General | Site: Leg Lower | Laterality: Right | Wound class: Clean Contaminated

## 2017-11-13 MED ORDER — CEFAZOLIN SODIUM-DEXTROSE 2-4 GM/100ML-% IV SOLN
INTRAVENOUS | Status: AC
  Filled 2017-11-13: qty 100

## 2017-11-13 MED ORDER — KETAMINE HCL 50 MG/ML IJ SOLN
INTRAMUSCULAR | Status: AC
  Filled 2017-11-13: qty 10

## 2017-11-13 MED ORDER — FAMOTIDINE 20 MG PO TABS
20.0000 mg | ORAL_TABLET | Freq: Once | ORAL | Status: AC
  Administered 2017-11-13: 20 mg via ORAL

## 2017-11-13 MED ORDER — PROPOFOL 500 MG/50ML IV EMUL
INTRAVENOUS | Status: DC | PRN
  Administered 2017-11-13: 50 ug/kg/min via INTRAVENOUS

## 2017-11-13 MED ORDER — PROPOFOL 10 MG/ML IV BOLUS
INTRAVENOUS | Status: DC | PRN
  Administered 2017-11-13: 30 mg via INTRAVENOUS
  Administered 2017-11-13: 20 mg via INTRAVENOUS

## 2017-11-13 MED ORDER — OXYCODONE HCL 5 MG PO TABS
ORAL_TABLET | ORAL | Status: AC
  Administered 2017-11-13: 5 mg via ORAL
  Filled 2017-11-13: qty 1

## 2017-11-13 MED ORDER — OXYCODONE HCL 5 MG/5ML PO SOLN
ORAL | Status: AC
  Filled 2017-11-13: qty 5

## 2017-11-13 MED ORDER — CHLORHEXIDINE GLUCONATE CLOTH 2 % EX PADS
6.0000 | MEDICATED_PAD | Freq: Once | CUTANEOUS | Status: AC
  Administered 2017-11-13: 6 via TOPICAL

## 2017-11-13 MED ORDER — MIDAZOLAM HCL 5 MG/5ML IJ SOLN
INTRAMUSCULAR | Status: AC
  Filled 2017-11-13: qty 5

## 2017-11-13 MED ORDER — OXYCODONE HCL 5 MG PO TABS
5.0000 mg | ORAL_TABLET | ORAL | Status: AC | PRN
  Administered 2017-11-13: 5 mg via ORAL

## 2017-11-13 MED ORDER — EPHEDRINE SULFATE 50 MG/ML IJ SOLN
INTRAMUSCULAR | Status: DC | PRN
  Administered 2017-11-13: 10 mg via INTRAVENOUS

## 2017-11-13 MED ORDER — EPINEPHRINE PF 1 MG/ML IJ SOLN
INTRAMUSCULAR | Status: AC
  Filled 2017-11-13: qty 1

## 2017-11-13 MED ORDER — PROPOFOL 500 MG/50ML IV EMUL
INTRAVENOUS | Status: AC
  Filled 2017-11-13: qty 50

## 2017-11-13 MED ORDER — FENTANYL CITRATE (PF) 100 MCG/2ML IJ SOLN
INTRAMUSCULAR | Status: DC | PRN
  Administered 2017-11-13 (×2): 25 ug via INTRAVENOUS
  Administered 2017-11-13: 50 ug via INTRAVENOUS

## 2017-11-13 MED ORDER — CEFAZOLIN SODIUM-DEXTROSE 2-4 GM/100ML-% IV SOLN
2.0000 g | INTRAVENOUS | Status: AC
  Administered 2017-11-13: 2 g via INTRAVENOUS

## 2017-11-13 MED ORDER — FAMOTIDINE 20 MG PO TABS
ORAL_TABLET | ORAL | Status: AC
  Administered 2017-11-13: 20 mg via ORAL
  Filled 2017-11-13: qty 1

## 2017-11-13 MED ORDER — FENTANYL CITRATE (PF) 100 MCG/2ML IJ SOLN: 25.0000 ug | INTRAMUSCULAR | Status: DC | PRN

## 2017-11-13 MED ORDER — VASOPRESSIN 20 UNIT/ML IV SOLN
INTRAVENOUS | Status: AC
  Filled 2017-11-13: qty 1

## 2017-11-13 MED ORDER — LACTATED RINGERS IV SOLN
INTRAVENOUS | Status: DC
  Administered 2017-11-13: 13:00:00 via INTRAVENOUS

## 2017-11-13 MED ORDER — ONDANSETRON HCL 4 MG/2ML IJ SOLN: 4.0000 mg | Freq: Once | INTRAMUSCULAR | Status: DC | PRN

## 2017-11-13 MED ORDER — FENTANYL CITRATE (PF) 100 MCG/2ML IJ SOLN
INTRAMUSCULAR | Status: AC
  Filled 2017-11-13: qty 2

## 2017-11-13 MED ORDER — LIDOCAINE HCL (CARDIAC) 20 MG/ML IV SOLN
INTRAVENOUS | Status: DC | PRN
  Administered 2017-11-13: 50 mg via INTRAVENOUS

## 2017-11-13 MED ORDER — PROPOFOL 10 MG/ML IV BOLUS
INTRAVENOUS | Status: AC
  Filled 2017-11-13: qty 20

## 2017-11-13 SURGICAL SUPPLY — 16 items
CANISTER SUCT 1200ML W/VALVE (MISCELLANEOUS) ×3 IMPLANT
DRAPE INCISE IOBAN 66X45 STRL (DRAPES) ×3 IMPLANT
DRAPE LAPAROTOMY 100X77 ABD (DRAPES) ×3 IMPLANT
ELECT REM PT RETURN 9FT ADLT (ELECTROSURGICAL) ×3
ELECTRODE REM PT RTRN 9FT ADLT (ELECTROSURGICAL) ×1 IMPLANT
GLOVE BIO SURGEON STRL SZ7 (GLOVE) ×3 IMPLANT
GOWN STRL REUS W/ TWL LRG LVL3 (GOWN DISPOSABLE) ×1 IMPLANT
GOWN STRL REUS W/ TWL XL LVL3 (GOWN DISPOSABLE) ×1 IMPLANT
GOWN STRL REUS W/TWL LRG LVL3 (GOWN DISPOSABLE) ×2
GOWN STRL REUS W/TWL XL LVL3 (GOWN DISPOSABLE) ×2
KIT TURNOVER KIT A (KITS) ×3 IMPLANT
NS IRRIG 1000ML POUR BTL (IV SOLUTION) ×3 IMPLANT
PACK BASIN MINOR ARMC (MISCELLANEOUS) ×3 IMPLANT
SOL PREP PVP 2OZ (MISCELLANEOUS) ×3
SOLUTION PREP PVP 2OZ (MISCELLANEOUS) ×1 IMPLANT
SPONGE LAP 18X18 5 PK (GAUZE/BANDAGES/DRESSINGS) ×3 IMPLANT

## 2017-11-13 NOTE — Anesthesia Preprocedure Evaluation (Signed)
Anesthesia Evaluation  Patient identified by MRN, date of birth, ID band Patient awake    Reviewed: Allergy & Precautions, H&P , NPO status , Patient's Chart, lab work & pertinent test results, reviewed documented beta blocker date and time   History of Anesthesia Complications Negative for: history of anesthetic complications  Airway Mallampati: III  TM Distance: >3 FB Neck ROM: full    Dental  (+) Dental Advidsory Given, Poor Dentition   Pulmonary shortness of breath and with exertion, sleep apnea , COPD,  COPD inhaler, neg recent URI, Current Smoker,           Cardiovascular Exercise Tolerance: Good hypertension, (-) angina+ CAD, + Past MI, + Cardiac Stents, + Peripheral Vascular Disease and +CHF  (-) CABG (-) dysrhythmias (-) Valvular Problems/Murmurs     Neuro/Psych negative neurological ROS  negative psych ROS   GI/Hepatic negative GI ROS, Neg liver ROS,   Endo/Other  neg diabetesMorbid obesity  Renal/GU negative Renal ROS  negative genitourinary   Musculoskeletal   Abdominal   Peds  Hematology negative hematology ROS (+)   Anesthesia Other Findings Past Medical History: No date: CAD (coronary artery disease) No date: Cervicalgia No date: CHF (congestive heart failure) (HCC) No date: COPD (chronic obstructive pulmonary disease) (HCC) No date: Diastolic heart failure (HCC) No date: Foot drop, right No date: History of kidney stones No date: Hyperlipidemia     Comment:  unspecified No date: Hypertension No date: Myocardial infarction (HCC) No date: Osteoarthritis No date: Shoulder pain, left No date: Sleep apnea No date: Tremor, essential   Reproductive/Obstetrics negative OB ROS                             Anesthesia Physical Anesthesia Plan  ASA: III  Anesthesia Plan: General   Post-op Pain Management:    Induction: Intravenous  PONV Risk Score and Plan: 1 and  Propofol infusion  Airway Management Planned: Simple Face Mask  Additional Equipment:   Intra-op Plan:   Post-operative Plan:   Informed Consent: I have reviewed the patients History and Physical, chart, labs and discussed the procedure including the risks, benefits and alternatives for the proposed anesthesia with the patient or authorized representative who has indicated his/her understanding and acceptance.   Dental Advisory Given  Plan Discussed with: Anesthesiologist, CRNA and Surgeon  Anesthesia Plan Comments:         Anesthesia Quick Evaluation

## 2017-11-13 NOTE — Transfer of Care (Signed)
Immediate Anesthesia Transfer of Care Note  Patient: Dylan [name]  Procedure(s) Performed: DEBRIDEMENT WOUND (Right Leg Lower) APPLICATION OF WOUND VAC (Right Leg Lower)  Patient Location: PACU  Anesthesia Type:MAC  Level of Consciousness: awake, alert , oriented and patient cooperative  Airway & Oxygen Therapy: Patient Spontanous Breathing and Patient connected to face mask oxygen  Post-op Assessment: Report given to RN and Post -op Vital signs reviewed and stable  Post vital signs: Reviewed and stable  Last Vitals:  Vitals:   11/13/17 1047  BP: 94/60  Pulse: 71  Resp: (!) 22  Temp: 36.4 C  SpO2: 98%    Last Pain:  Vitals:   11/13/17 1047  TempSrc: Oral         Complications: No apparent anesthesia complications

## 2017-11-13 NOTE — Op Note (Signed)
    OPERATIVE NOTE   PROCEDURE: 1. Irrigation and debridement of right calf wound on the skin, soft tissue, and muscle with VAC dressing placement for approximately 25 cm2  PRE-OPERATIVE DIAGNOSIS: Nonviable tissue and infection of right calf wound  POST-OPERATIVE DIAGNOSIS: Same as above  SURGEON: Leotis Pain, MD  ASSISTANT(S): Hezzie Bump, PA-C  ANESTHESIA: MAC  ESTIMATED BLOOD LOSS: 10 cc  FINDING(S): None  SPECIMEN(S):  none  INDICATIONS:   Dylan [name] is a 82 y.o. male who presents with a nonhealing ulcer of the right calf with significant undermining and tunneling.  This tracks several centimeters superiorly and several centimeters medially in addition to the right lateral calf wound.  He is brought to the operating room for debridement and a negative pressure dressing.  DESCRIPTION: After obtaining full informed written consent, the patient was brought back to the operating room and placed supine upon the operating table.  The patient received IV antibiotics prior to induction.  After obtaining adequate anesthesia, the patient was prepped and draped in the standard fashion.  The wound was then opened and excisional debridement was performed to the skin, soft tissue, and down to the muscle to remove all clearly non-viable tissue.  The tissue was taken back to bleeding tissue that appeared viable.  The debridement was performed with a scalpel and electrocautery and encompassed an area of approximately 25 cm2.  The skin was taken back such that the VAC sponge could easily be fit into the wound even though there is still a few centimeters of tunneling superiorly and medially.  The wound was irrigated copiously with sterile saline.  After all clearly non-viable tissue was removed, a negative pressure dressing was then placed.  It was tucked to fill the entire space of the wound which included a few centimeters superiorly and medially. The patient was then awakened from anesthesia  and taken to the recovery room in stable condition having tolerated the procedure well.  COMPLICATIONS: none  CONDITION: stable  Leotis Pain  11/13/2017, 2:11 PM   This note was created with Dragon Medical transcription system. Any errors in dictation are purely unintentional.

## 2017-11-13 NOTE — Anesthesia Post-op Follow-up Note (Signed)
Anesthesia QCDR form completed.        

## 2017-11-13 NOTE — Discharge Instructions (Signed)

## 2017-11-13 NOTE — H&P (Signed)
[facility] History & Physical Update  The patient was interviewed and re-examined.  The patient's previous History and Physical has been reviewed and is unchanged.  There is no change in the plan of care. We plan to proceed with the scheduled procedure.  Festus BarrenJason Dew, MD  11/13/2017, 11:56 AM

## 2017-11-14 ENCOUNTER — Ambulatory Visit (INDEPENDENT_AMBULATORY_CARE_PROVIDER_SITE_OTHER): Payer: Medicare Other | Admitting: Vascular Surgery

## 2017-11-14 ENCOUNTER — Encounter: Payer: Self-pay | Admitting: Vascular Surgery

## 2017-11-14 NOTE — Anesthesia Postprocedure Evaluation (Signed)
Anesthesia Post Note  Patient: Dylan [name]  Procedure(s) Performed: DEBRIDEMENT WOUND (Right Leg Lower) APPLICATION OF WOUND VAC (Right Leg Lower)  Patient location during evaluation: PACU Anesthesia Type: General Level of consciousness: awake and alert Pain management: pain level controlled Vital Signs Assessment: post-procedure vital signs reviewed and stable Respiratory status: spontaneous breathing, nonlabored ventilation, respiratory function stable and patient connected to nasal cannula oxygen Cardiovascular status: blood pressure returned to baseline and stable Postop Assessment: no apparent nausea or vomiting Anesthetic complications: no     Last Vitals:  Vitals:   11/13/17 1507 11/13/17 1541  BP: 139/66 105/67  Pulse: 65 (!) 54  Resp: 18 18  Temp: 36.5 C   SpO2: 100% 100%    Last Pain:  Vitals:   11/13/17 1541  TempSrc:   PainSc: 3                  Cleda MccreedyJoseph K Jazlynne Milliner

## 2017-11-18 ENCOUNTER — Telehealth (INDEPENDENT_AMBULATORY_CARE_PROVIDER_SITE_OTHER): Payer: Self-pay

## 2017-11-18 NOTE — Telephone Encounter (Signed)
Called Dylan BeeKevin back to let him know that Kim-PA stated that it should be fine for the patient to use a foot pedal, even with some minor pain. I had to leave a message.

## 2017-11-18 NOTE — Telephone Encounter (Signed)
Spoke with Caryn BeeKevin the therapist and informed him of Dr. Driscilla Grammesew's recommendations. See notes below.

## 2017-11-18 NOTE — Telephone Encounter (Signed)
See note below

## 2017-11-18 NOTE — Telephone Encounter (Signed)
Caryn BeeKevin therapist at [facility] called stating the patient is using a foot pedal for PT, the patient has a wound vac on his leg and the patient stated that over the weekend he used the foot pedal and it bothered him. Is it okay to continue using the foot pedal for the patient.

## 2017-11-18 NOTE — Telephone Encounter (Signed)
Caryn BeeKevin from [facility] called to state that the patient is doing PT, OT and he now has a VAC.   Caryn BeeKevin says that the patient is using a foot pedal, but says that the patient is complaining of pain. Is it okay that the patient still use the foot pedal?

## 2017-11-18 NOTE — Telephone Encounter (Signed)
Yes using a foot pedal is fine.

## 2017-12-01 ENCOUNTER — Other Ambulatory Visit: Payer: Self-pay

## 2017-12-01 ENCOUNTER — Encounter: Payer: Self-pay | Admitting: Emergency Medicine

## 2017-12-01 ENCOUNTER — Inpatient Hospital Stay
Admission: EM | Admit: 2017-12-01 | Discharge: 2017-12-05 | DRG: 871 | Disposition: A | Discharge status: Skilled Nursing Facility | Payer: Medicare Other | Attending: Internal Medicine | Admitting: Internal Medicine

## 2017-12-01 ENCOUNTER — Emergency Department: Payer: Medicare Other

## 2017-12-01 DIAGNOSIS — N183 Chronic kidney disease, stage 3 (moderate): Secondary | ICD-10-CM | POA: Diagnosis present

## 2017-12-01 DIAGNOSIS — H5789 Other specified disorders of eye and adnexa: Secondary | ICD-10-CM | POA: Diagnosis present

## 2017-12-01 DIAGNOSIS — G629 Polyneuropathy, unspecified: Secondary | ICD-10-CM | POA: Diagnosis present

## 2017-12-01 DIAGNOSIS — Z6841 Body Mass Index (BMI) 40.0 and over, adult: Secondary | ICD-10-CM | POA: Diagnosis not present

## 2017-12-01 DIAGNOSIS — L8990 Pressure ulcer of unspecified site, unspecified stage: Secondary | ICD-10-CM

## 2017-12-01 DIAGNOSIS — I2510 Atherosclerotic heart disease of native coronary artery without angina pectoris: Secondary | ICD-10-CM | POA: Diagnosis present

## 2017-12-01 DIAGNOSIS — I252 Old myocardial infarction: Secondary | ICD-10-CM | POA: Diagnosis not present

## 2017-12-01 DIAGNOSIS — E876 Hypokalemia: Secondary | ICD-10-CM | POA: Diagnosis present

## 2017-12-01 DIAGNOSIS — I872 Venous insufficiency (chronic) (peripheral): Secondary | ICD-10-CM | POA: Diagnosis present

## 2017-12-01 DIAGNOSIS — J440 Chronic obstructive pulmonary disease with acute lower respiratory infection: Secondary | ICD-10-CM | POA: Diagnosis present

## 2017-12-01 DIAGNOSIS — N189 Chronic kidney disease, unspecified: Secondary | ICD-10-CM | POA: Diagnosis not present

## 2017-12-01 DIAGNOSIS — L03115 Cellulitis of right lower limb: Secondary | ICD-10-CM | POA: Diagnosis present

## 2017-12-01 DIAGNOSIS — E785 Hyperlipidemia, unspecified: Secondary | ICD-10-CM | POA: Diagnosis present

## 2017-12-01 DIAGNOSIS — J449 Chronic obstructive pulmonary disease, unspecified: Secondary | ICD-10-CM | POA: Diagnosis not present

## 2017-12-01 DIAGNOSIS — A419 Sepsis, unspecified organism: Principal | ICD-10-CM | POA: Diagnosis present

## 2017-12-01 DIAGNOSIS — F17210 Nicotine dependence, cigarettes, uncomplicated: Secondary | ICD-10-CM | POA: Diagnosis present

## 2017-12-01 DIAGNOSIS — Z7982 Long term (current) use of aspirin: Secondary | ICD-10-CM | POA: Diagnosis not present

## 2017-12-01 DIAGNOSIS — L97919 Non-pressure chronic ulcer of unspecified part of right lower leg with unspecified severity: Secondary | ICD-10-CM | POA: Diagnosis present

## 2017-12-01 DIAGNOSIS — G250 Essential tremor: Secondary | ICD-10-CM | POA: Diagnosis present

## 2017-12-01 DIAGNOSIS — Z79899 Other long term (current) drug therapy: Secondary | ICD-10-CM

## 2017-12-01 DIAGNOSIS — J189 Pneumonia, unspecified organism: Secondary | ICD-10-CM | POA: Diagnosis present

## 2017-12-01 DIAGNOSIS — N179 Acute kidney failure, unspecified: Secondary | ICD-10-CM | POA: Diagnosis present

## 2017-12-01 DIAGNOSIS — R0602 Shortness of breath: Secondary | ICD-10-CM | POA: Diagnosis not present

## 2017-12-01 DIAGNOSIS — I130 Hypertensive heart and chronic kidney disease with heart failure and stage 1 through stage 4 chronic kidney disease, or unspecified chronic kidney disease: Secondary | ICD-10-CM | POA: Diagnosis present

## 2017-12-01 DIAGNOSIS — G4730 Sleep apnea, unspecified: Secondary | ICD-10-CM | POA: Diagnosis present

## 2017-12-01 DIAGNOSIS — I5032 Chronic diastolic (congestive) heart failure: Secondary | ICD-10-CM | POA: Diagnosis present

## 2017-12-01 DIAGNOSIS — R296 Repeated falls: Secondary | ICD-10-CM | POA: Diagnosis present

## 2017-12-01 DIAGNOSIS — I10 Essential (primary) hypertension: Secondary | ICD-10-CM | POA: Diagnosis not present

## 2017-12-01 DIAGNOSIS — S81801D Unspecified open wound, right lower leg, subsequent encounter: Secondary | ICD-10-CM | POA: Diagnosis not present

## 2017-12-01 LAB — CBC WITH DIFFERENTIAL/PLATELET
BASOS ABS: 0 10*3/uL (ref 0–0.1)
Basophils Relative: 0 %
EOS ABS: 0 10*3/uL (ref 0–0.7)
EOS PCT: 0 %
HCT: 36.1 % — ABNORMAL LOW (ref 40.0–52.0)
Hemoglobin: 12 g/dL — ABNORMAL LOW (ref 13.0–18.0)
Lymphocytes Relative: 9 %
Lymphs Abs: 1.5 10*3/uL (ref 1.0–3.6)
MCH: 31.2 pg (ref 26.0–34.0)
MCHC: 33.2 g/dL (ref 32.0–36.0)
MCV: 93.9 fL (ref 80.0–100.0)
MONO ABS: 1.2 10*3/uL — AB (ref 0.2–1.0)
Monocytes Relative: 7 %
Neutro Abs: 13.6 10*3/uL — ABNORMAL HIGH (ref 1.4–6.5)
Neutrophils Relative %: 84 %
PLATELETS: 129 10*3/uL — AB (ref 150–440)
RBC: 3.84 MIL/uL — AB (ref 4.40–5.90)
RDW: 16.7 % — AB (ref 11.5–14.5)
WBC: 16.3 10*3/uL — AB (ref 3.8–10.6)

## 2017-12-01 LAB — LACTIC ACID, PLASMA
LACTIC ACID, VENOUS: 1.6 mmol/L (ref 0.5–1.9)
Lactic Acid, Venous: 2.4 mmol/L (ref 0.5–1.9)

## 2017-12-01 LAB — COMPREHENSIVE METABOLIC PANEL
ALBUMIN: 3.2 g/dL — AB (ref 3.5–5.0)
ALT: 44 U/L (ref 17–63)
AST: 45 U/L — AB (ref 15–41)
Alkaline Phosphatase: 93 U/L (ref 38–126)
Anion gap: 10 (ref 5–15)
BILIRUBIN TOTAL: 0.7 mg/dL (ref 0.3–1.2)
BUN: 53 mg/dL — AB (ref 6–20)
CALCIUM: 8.3 mg/dL — AB (ref 8.9–10.3)
CO2: 28 mmol/L (ref 22–32)
CREATININE: 1.8 mg/dL — AB (ref 0.61–1.24)
Chloride: 100 mmol/L — ABNORMAL LOW (ref 101–111)
GFR calc Af Amer: 39 mL/min — ABNORMAL LOW (ref >60)
GFR, EST NON AFRICAN AMERICAN: 34 mL/min — AB (ref >60)
GLUCOSE: 160 mg/dL — AB (ref 65–99)
Potassium: 3 mmol/L — ABNORMAL LOW (ref 3.5–5.1)
Sodium: 138 mmol/L (ref 135–145)
TOTAL PROTEIN: 6.5 g/dL (ref 6.5–8.1)

## 2017-12-01 LAB — INFLUENZA PANEL BY PCR (TYPE A & B)
INFLBPCR: NEGATIVE
Influenza A By PCR: NEGATIVE

## 2017-12-01 LAB — TROPONIN I: TROPONIN I: 0.12 ng/mL — AB (ref <0.03)

## 2017-12-01 MED ORDER — HEPARIN SODIUM (PORCINE) 5000 UNIT/ML IJ SOLN
5000.0000 [IU] | Freq: Three times a day (TID) | INTRAMUSCULAR | Status: DC
  Administered 2017-12-01 – 2017-12-05 (×10): 5000 [IU] via SUBCUTANEOUS
  Filled 2017-12-01 (×11): qty 1

## 2017-12-01 MED ORDER — DOCUSATE SODIUM 100 MG PO CAPS: 100.0000 mg | ORAL_CAPSULE | Freq: Two times a day (BID) | ORAL | Status: DC | PRN

## 2017-12-01 MED ORDER — IPRATROPIUM-ALBUTEROL 0.5-2.5 (3) MG/3ML IN SOLN: 3.0000 mL | Freq: Four times a day (QID) | RESPIRATORY_TRACT | Status: DC | PRN

## 2017-12-01 MED ORDER — POTASSIUM CHLORIDE CRYS ER 20 MEQ PO TBCR
20.0000 meq | EXTENDED_RELEASE_TABLET | Freq: Two times a day (BID) | ORAL | Status: AC
  Administered 2017-12-01 – 2017-12-02 (×3): 20 meq via ORAL
  Filled 2017-12-01 (×3): qty 1

## 2017-12-01 MED ORDER — GABAPENTIN 400 MG PO CAPS
400.0000 mg | ORAL_CAPSULE | Freq: Every day | ORAL | Status: DC
  Administered 2017-12-01 – 2017-12-04 (×4): 400 mg via ORAL
  Filled 2017-12-01 (×4): qty 1

## 2017-12-01 MED ORDER — SODIUM CHLORIDE 0.9 % IV SOLN
2.0000 g | Freq: Once | INTRAVENOUS | Status: AC
  Administered 2017-12-01: 2 g via INTRAVENOUS
  Filled 2017-12-01: qty 2

## 2017-12-01 MED ORDER — SODIUM CHLORIDE 0.9 % IV SOLN
3.0000 g | Freq: Four times a day (QID) | INTRAVENOUS | Status: DC
  Administered 2017-12-01 – 2017-12-02 (×3): 3 g via INTRAVENOUS
  Filled 2017-12-01 (×5): qty 3

## 2017-12-01 MED ORDER — ASPIRIN EC 81 MG PO TBEC
81.0000 mg | DELAYED_RELEASE_TABLET | Freq: Every day | ORAL | Status: DC
  Administered 2017-12-02 – 2017-12-05 (×4): 81 mg via ORAL
  Filled 2017-12-01 (×4): qty 1

## 2017-12-01 MED ORDER — BOOST PO LIQD
237.0000 mL | Freq: Every day | ORAL | Status: DC
  Administered 2017-12-01 – 2017-12-05 (×5): 237 mL via ORAL
  Filled 2017-12-01 (×5): qty 237

## 2017-12-01 MED ORDER — ISOSORBIDE MONONITRATE ER 30 MG PO TB24: 30.0000 mg | ORAL_TABLET | Freq: Every day | ORAL | Status: DC

## 2017-12-01 MED ORDER — SODIUM CHLORIDE 0.9 % IV SOLN
INTRAVENOUS | Status: DC
Start: 2017-12-01 — End: 2017-12-02
  Administered 2017-12-01: 23:00:00 via INTRAVENOUS

## 2017-12-01 MED ORDER — DOCUSATE SODIUM 100 MG PO CAPS
200.0000 mg | ORAL_CAPSULE | Freq: Every day | ORAL | Status: DC
  Administered 2017-12-01 – 2017-12-04 (×4): 200 mg via ORAL
  Filled 2017-12-01 (×5): qty 2

## 2017-12-01 MED ORDER — UMECLIDINIUM-VILANTEROL 62.5-25 MCG/INH IN AEPB
1.0000 | INHALATION_SPRAY | Freq: Every day | RESPIRATORY_TRACT | Status: DC
  Administered 2017-12-01 – 2017-12-05 (×5): 1 via RESPIRATORY_TRACT
  Filled 2017-12-01: qty 14

## 2017-12-01 MED ORDER — FLUTICASONE PROPIONATE 50 MCG/ACT NA SUSP
1.0000 | Freq: Every day | NASAL | Status: DC | PRN
  Filled 2017-12-01 (×2): qty 16

## 2017-12-01 MED ORDER — VANCOMYCIN HCL IN DEXTROSE 1-5 GM/200ML-% IV SOLN
1000.0000 mg | Freq: Once | INTRAVENOUS | Status: AC
  Administered 2017-12-01: 1000 mg via INTRAVENOUS
  Filled 2017-12-01: qty 200

## 2017-12-01 MED ORDER — ATORVASTATIN CALCIUM 20 MG PO TABS
40.0000 mg | ORAL_TABLET | Freq: Every evening | ORAL | Status: DC
  Administered 2017-12-02 – 2017-12-04 (×3): 40 mg via ORAL
  Filled 2017-12-01 (×3): qty 2

## 2017-12-01 MED ORDER — SODIUM CHLORIDE 0.9 % IV BOLUS
1000.0000 mL | Freq: Once | INTRAVENOUS | Status: AC
Start: 2017-12-01 — End: 2017-12-01
  Administered 2017-12-01: 1000 mL via INTRAVENOUS

## 2017-12-01 NOTE — Consult Note (Signed)
Pharmacy Antibiotic Note  Dylan [name] is a 82 y.o. male admitted on 12/01/2017 with pneumonia.  Pharmacy has been consulted for Unasyn dosing.  Plan: Start Unasyn 3g IV every 6 hours.   Weight: 280 lb (127 kg)  Temp (24hrs), Avg:99.3 F (37.4 C), Min:98.6 F (37 C), Max:100 F (37.8 C)  Recent Labs  Lab 12/01/17 1253 12/01/17 1418  WBC 16.3*  --   CREATININE 1.80*  --   LATICACIDVEN 1.6 2.4*    Estimated Creatinine Clearance: 43.8 mL/min (A) (by C-G formula based on SCr of 1.8 mg/dL (H)).    No Known Allergies  Antimicrobials this admission: 4/1 cefepime >> x 1 dose 4/1 Unasyn >>   Microbiology results: 4/1 BCx: pending  4/1 MRSA PCR: pending  Thank you for allowing pharmacy to be a part of this patient's care.  Gardner CandleSheema M Nethan Caudillo, PharmD, BCPS Clinical Pharmacist 12/01/2017 6:21 PM

## 2017-12-01 NOTE — ED Notes (Signed)
Patient's family reports that he has been weak since yesterday and had a temperature of 101 yesterday.  Family states patient is more pale than usual and that patient had a fall this morning.  Family states they are unsure of whether patient hit his head.

## 2017-12-01 NOTE — ED Notes (Signed)
Family at bedside. 

## 2017-12-01 NOTE — ED Notes (Signed)
Date and time results received: 12/01/17 1500 (use smartphrase ".now" to insert current time)  Test: troponin Critical Value: 0.12  Name of Provider Notified: Siadecki  Orders Received? Or Actions Taken?: Orders Received - See Orders for details

## 2017-12-01 NOTE — ED Notes (Signed)
Family updated on plan of care  

## 2017-12-01 NOTE — ED Notes (Signed)
Assisted pt up to toilet with EDT Crystal and Charge RN Avery DennisonHeather

## 2017-12-01 NOTE — H&P (Signed)
[facility] [place] [facility]   PATIENT NAME: Dylan [name]    MR#:  [id number]  DATE OF BIRTH:  [birth year]  DATE OF ADMISSION:  12/01/2017  PRIMARY CARE PHYSICIAN: [name], MD   REQUESTING/REFERRING PHYSICIAN: seidecki  CHIEF COMPLAINT:   Chief Complaint  Patient presents with  . Knee Pain    HISTORY OF PRESENT ILLNESS: Dylan [name]  is a 82 y.o. male with a known history of coronary artery disease, CHF, COPD, diastolic heart failure, hyperlipidemia, hypertension, sleep apnea- had hematoma on right leg and some circular issues,  Followed by vascular surgery clinic and  Wound VAC on the right leg with following nurse at home.was on antibiotics in past, but not recently. Dressing change was done in vascular clinic visit 2 weeks ago. His next visit was scheduled tomorrow. His family noted he is progressively getting weaker and more lethargic since yesterday, so brought him to emergency room. He was noted to have elevated white blood cell counts and pneumonia, given for admission after starting on antibiotics. As per family there was low-grade fever at home.  PAST MEDICAL HISTORY:   Past Medical History:  Diagnosis Date  . CAD (coronary artery disease)   . Cervicalgia   . CHF (congestive heart failure) (HCC)   . COPD (chronic obstructive pulmonary disease) (HCC)   . Diastolic heart failure (HCC)   . Foot drop, right   . History of kidney stones   . Hyperlipidemia    unspecified  . Hypertension   . Myocardial infarction (HCC)   . Osteoarthritis   . Shoulder pain, left   . Sleep apnea   . Tremor, essential     PAST SURGICAL HISTORY:  Past Surgical History:  Procedure Laterality Date  . APPLICATION OF WOUND VAC Right 11/13/2017   Procedure: APPLICATION OF WOUND VAC;  Surgeon: Annice Needy, MD;  Location: [facility] ORS;  Service: Vascular;  Laterality: Right;  . BACK SURGERY  06/2010  . CARDIAC CATHETERIZATION Left 04/30/2016   Procedure: Left Heart Cath  and Coronary Angiography;  Surgeon: Laurier Nancy, MD;  Location: [facility] INVASIVE CV LAB;  Service: Cardiovascular;  Laterality: Left;  . COLONOSCOPY    . CORONARY ANGIOPLASTY    . KNEE ARTHROSCOPY Left   . KNEE SURGERY Left 1998  . TONSILLECTOMY    . WOUND DEBRIDEMENT Right 11/13/2017   Procedure: DEBRIDEMENT WOUND;  Surgeon: Annice Needy, MD;  Location: [facility] ORS;  Service: Vascular;  Laterality: Right;    SOCIAL HISTORY:  Social History   Tobacco Use  . Smoking status: Light Tobacco Smoker    Packs/day: 0.25    Years: 58.00    Pack years: 14.50    Types: Cigarettes  . Smokeless tobacco: Former Neurosurgeon  . Tobacco comment: 1 cigarette per day  Substance Use Topics  . Alcohol use: No    Alcohol/week: 0.0 oz    FAMILY HISTORY:  Family History  Problem Relation Age of Onset  . Diabetes Son   . Cancer Mother   . Colon cancer Mother   . Lung cancer Mother   . Tremor Father   . Heart disease Father   . Tremor Brother     DRUG ALLERGIES: No Known Allergies  REVIEW OF SYSTEMS:   CONSTITUTIONAL: No fever, Have fatigue or weakness.  EYES: No blurred or double vision.  EARS, NOSE, AND THROAT: No tinnitus or ear pain.  RESPIRATORY: No cough, shortness of breath, wheezing or hemoptysis.  CARDIOVASCULAR: No  chest pain, orthopnea, edema.  GASTROINTESTINAL: No nausea, vomiting, diarrhea or abdominal pain.  GENITOURINARY: No dysuria, hematuria.  ENDOCRINE: No polyuria, nocturia,  HEMATOLOGY: No anemia, easy bruising or bleeding SKIN: No rash or lesion. MUSCULOSKELETAL: No joint pain or arthritis.  Right leg swelling and redness with wound VAC. NEUROLOGIC: No tingling, numbness, weakness.  PSYCHIATRY: No anxiety or depression.   MEDICATIONS AT HOME:  Prior to Admission medications   Medication Sig Start Date End Date Taking? Authorizing Provider  aspirin EC 81 MG tablet Take 81 mg by mouth daily.   Yes [provider]  atorvastatin (LIPITOR) 40 MG tablet Take 40 mg by  mouth every evening.   Yes [provider]  carvedilol (COREG) 12.5 MG tablet Take 12.5 mg by mouth 2 (two) times daily.   Yes [provider]  docusate sodium (COLACE) 100 MG capsule Take 200 mg by mouth at bedtime.    Yes [provider]  hydrochlorothiazide (HYDRODIURIL) 25 MG tablet Take 25 mg by mouth daily.  10/21/17  Yes [provider]  isosorbide mononitrate (IMDUR) 30 MG 24 hr tablet Take 30 mg by mouth daily.  05/31/16  Yes [provider]  lisinopril (PRINIVIL,ZESTRIL) 5 MG tablet Take 5 mg by mouth daily.    Yes [provider]  Multiple Vitamins-Minerals (CENTROVITE) TABS Take 1 tablet by mouth daily.   Yes [provider]  Potassium 99 MG TABS Take 99 mg by mouth daily.   Yes [provider]  primidone (MYSOLINE) 50 MG tablet Take 200 mg by mouth 2 (two) times daily. 4 tabs   Yes [provider]  Skin Protectants, Misc. (ENDIT EX) Apply topically to sacrum/buttocks 2 times daily and as needed for irritation   Yes [provider]  spironolactone (ALDACTONE) 25 MG tablet Take 12.5 mg by mouth daily.    Yes [provider]  torsemide (DEMADEX) 20 MG tablet Take 20 mg by mouth daily.    Yes [provider]  cephALEXin (KEFLEX) 500 MG capsule Take 1 capsule (500 mg total) by mouth 4 (four) times daily. Patient not taking: Reported on 12/01/2017 10/16/17   Stegmayer, Cala Bradford A, PA-C  Chlorpheniramine Maleate (ALLERGY PO) Take 1 tablet by mouth daily as needed (allergies).    [provider]  fluticasone (FLONASE) 50 MCG/ACT nasal spray Place 1 spray into both nostrils daily. 11/10/17   [provider]  gabapentin (NEURONTIN) 400 MG capsule Take 400 mg by mouth at bedtime.    [provider]  Infant Care Products [facility]) CREA Apply liberal amount topically to area of skin irritation as needed. Ok to leave at bedside.    [provider]   ipratropium-albuterol (DUONEB) 0.5-2.5 (3) MG/3ML SOLN Take 3 mLs by nebulization every 6 (six) hours as needed (shortness of breath).     [provider]  lactose free nutrition (BOOST) LIQD Take 237 mLs by mouth daily.    [provider]  nitroGLYCERIN (NITROSTAT) 0.3 MG SL tablet Place 0.3 mg under the tongue every 5 (five) minutes as needed for chest pain.    [provider]  traMADol (ULTRAM) 50 MG tablet Take 1 tablet (50 mg total) by mouth every 4 (four) hours as needed. Patient taking differently: Take 50-100 mg by mouth every 4 (four) hours as needed for moderate pain.  09/24/17   Lorenso Quarry, NP  umeclidinium-vilanterol (ANORO ELLIPTA) 62.5-25 MCG/INH AEPB Inhale 1 puff into the lungs daily. 09/04/17   Shaune Pollack, MD  PHYSICAL EXAMINATION:   VITAL SIGNS: Blood pressure (!) 88/52, pulse (!) 57, temperature 97.7 F (36.5 C), temperature source Oral, resp. rate 20, weight 127 kg (280 lb), SpO2 99 %.  GENERAL:  82 y.o.-year-old patient lying in the bed with no acute distress.  EYES: Pupils equal, round, reactive to light and accommodation. No scleral icterus. Extraocular muscles intact.  HEENT: Head atraumatic, normocephalic. Oropharynx and nasopharynx clear.  NECK:  Supple, no jugular venous distention. No thyroid enlargement, no tenderness.  LUNGS: Normal breath sounds bilaterally, no wheezing, have crepitation. No use of accessory muscles of respiration.  CARDIOVASCULAR: S1, S2 normal. No murmurs, rubs, or gallops.  ABDOMEN: Soft, nontender, nondistended. Bowel sounds present. No organomegaly or mass.  EXTREMITIES: No pedal edema, cyanosis, or clubbing. Right leg have wound VAC, redness on lower half of the leg with some swelling. NEUROLOGIC: Cranial nerves II through XII are intact. Muscle strength 4/5 in all extremities. Sensation intact. Gait not checked.  PSYCHIATRIC: The patient is alert and oriented x 3.  SKIN: No obvious rash, lesion, or ulcer.    LABORATORY PANEL:   CBC Recent Labs  Lab 12/01/17 1253  WBC 16.3*  HGB 12.0*  HCT 36.1*  PLT 129*  MCV 93.9  MCH 31.2  MCHC 33.2  RDW 16.7*  LYMPHSABS 1.5  MONOABS 1.2*  EOSABS 0.0  BASOSABS 0.0   ------------------------------------------------------------------------------------------------------------------  Chemistries  Recent Labs  Lab 12/01/17 1253  NA 138  K 3.0*  CL 100*  CO2 28  GLUCOSE 160*  BUN 53*  CREATININE 1.80*  CALCIUM 8.3*  AST 45*  ALT 44  ALKPHOS 93  BILITOT 0.7   ------------------------------------------------------------------------------------------------------------------ estimated creatinine clearance is 43.8 mL/min (A) (by C-G formula based on SCr of 1.8 mg/dL (H)). ------------------------------------------------------------------------------------------------------------------ No results for input(s): TSH, T4TOTAL, T3FREE, THYROIDAB in the last 72 hours.  Invalid input(s): FREET3   Coagulation profile No results for input(s): INR, PROTIME in the last 168 hours. ------------------------------------------------------------------------------------------------------------------- No results for input(s): DDIMER in the last 72 hours. -------------------------------------------------------------------------------------------------------------------  Cardiac Enzymes Recent Labs  Lab 12/01/17 1253  TROPONINI 0.12*   ------------------------------------------------------------------------------------------------------------------ Invalid input(s): POCBNP  ---------------------------------------------------------------------------------------------------------------  Urinalysis    Component Value Date/Time   COLORURINE YELLOW (A) 08/31/2017 0410   APPEARANCEUR CLEAR (A) 08/31/2017 0410   LABSPEC 1.015 08/31/2017 0410   PHURINE 6.0 08/31/2017 0410   GLUCOSEU NEGATIVE 08/31/2017 0410   HGBUR NEGATIVE 08/31/2017 0410    BILIRUBINUR NEGATIVE 08/31/2017 0410   KETONESUR NEGATIVE 08/31/2017 0410   PROTEINUR 30 (A) 08/31/2017 0410   UROBILINOGEN 0.2 06/07/2010 0426   NITRITE NEGATIVE 08/31/2017 0410   LEUKOCYTESUR NEGATIVE 08/31/2017 0410     RADIOLOGY: Dg Tibia/fibula Right  Result Date: 12/01/2017 CLINICAL DATA:  Patient's family reports that he has been weak since yesterday and had a temperature of 101 yesterday. Family states patient is more pale than usual and that patient had a fall this morning. Family states they are unsure of whether patient hit his head. Wound vac right lower leg in place since 11/13/2017 for non-healing ulcer. EXAM: RIGHT TIBIA AND FIBULA - 2 VIEW COMPARISON:  08/31/2017 FINDINGS: No fracture. No bone resorption is seen to suggest osteomyelitis. There is area of sclerosis in the distal femur consistent with a healed cartilaginous lesion, stable from the prior exam. Knee and ankle joints are normally aligned. Wound VAC device projects over the lateral mid leg. No underlying mass or soft tissue air. There are soft tissue calcifications most evident along the medial lower leg to ankle.  There are also vascular calcifications. IMPRESSION: 1. No fracture or evidence of osteomyelitis. 2. No soft tissue air. Electronically Signed   By: Amie Portland M.D.   On: 12/01/2017 14:59   Dg Chest Portable 1 View  Result Date: 12/01/2017 CLINICAL DATA:  Weakness and fever. EXAM: PORTABLE CHEST 1 VIEW COMPARISON:  09/02/2017 FINDINGS: Mildly enlarged cardiac silhouette. Calcific atherosclerotic disease of the aorta. There is no evidence of pleural effusion or pneumothorax. Mild haziness of the left lung base may represent subtle airspace consolidation. Osseous structures are without acute abnormality. Soft tissues are grossly normal. IMPRESSION: Possible left lower lobe airspace consolidation. Electronically Signed   By: Ted Mcalpine M.D.   On: 12/01/2017 14:56    EKG: Orders placed or performed during  the hospital encounter of 12/01/17  . ED EKG  . ED EKG    IMPRESSION AND PLAN: * sepsis   Secondary to pneumonia and cellulitis   IV fluids, cultures are sent, IV antibiotics, check MRSA screening.  * right leg hematoma, status post wound VAC, cellulitis.   Unasyn for now,vascular consult to follow in hospital.  * History of hypertension   Due to hypotension, hold all antihypertensive medications for now, keep on IV fluids for now.  * hyperlipidemia   Continue atorvastatin.  * COPD   Continue nebulizer treatment.  * acute renal failure on CK D stage III   Continue monitoring with IV fluids.  * hypokalemia    Replace oral and check again.  * elevated troponin   Likely due to infection and sepsis.   All the records are reviewed and case discussed with ED provider. Management plans discussed with the patient, family and they are in agreement.  CODE STATUS: full.    Code Status Orders  (From admission, onward)        Start     Ordered   12/01/17 1924  Full code  Continuous     12/01/17 1923    Code Status History    Date Active Date Inactive Code Status Order ID Comments User Context   08/31/2017 0557 09/05/2017 1959 Full Code [id number]  Arnaldo Natal, MD Inpatient   06/10/2016 1934 06/13/2016 1816 Full Code [id number]  Shaune Pollack, MD Inpatient   12/15/2015 0134 12/16/2015 1507 Full Code [id number]  Ihor Austin, MD ED    Advance Directive Documentation     Most Recent Value  Type of Advance Directive  Healthcare Power of Attorney, Living will  Pre-existing out of facility DNR order (yellow form or pink MOST form)  -  "MOST" Form in Place?  -     Patient's wife and son present in the room.  TOTAL TIME TAKING CARE OF THIS PATIENT: 50 minutes.    Altamese Dilling M.D on 12/01/2017   Between 7am to 6pm - Pager - [phone]  After 6pm go to [email or web address] - password [email or web address]  [facility]  Office  [phone]  CC: Primary care  physician; [name], MD   Note: This dictation was prepared with Dragon dictation along with smaller phrase technology. Any transcriptional errors that result from this process are unintentional.

## 2017-12-01 NOTE — ED Notes (Signed)
Pt resting comfortably at this time. Family at bedside

## 2017-12-01 NOTE — ED Notes (Signed)
Pt assisted back into bed.

## 2017-12-01 NOTE — ED Provider Notes (Signed)
[facility] Emergency Department Provider Note ____________________________________________   First MD Initiated Contact with Patient 12/01/17 1302     (approximate)  I have reviewed the triage vital signs and the nursing notes.   HISTORY  Chief Complaint Knee Pain    HPI Dylan [name] is a 82 y.o. male with past medical history as noted below as well as chronic right calf wound status post VAC dressing who presents with increased generalized weakness over the last day, relatively acute onset, associated with decreased energy and appetite, and with fever to 101 at home yesterday.  The patient fell this morning because he was so weak.  He denies any specific acute pain to the right lower leg, but the sons state that the right lower leg might be more red than usual.  He has had no acute trauma to the right leg.   Past Medical History:  Diagnosis Date  . CAD (coronary artery disease)   . Cervicalgia   . CHF (congestive heart failure) (HCC)   . COPD (chronic obstructive pulmonary disease) (HCC)   . Diastolic heart failure (HCC)   . Foot drop, right   . History of kidney stones   . Hyperlipidemia    unspecified  . Hypertension   . Myocardial infarction (HCC)   . Osteoarthritis   . Shoulder pain, left   . Sleep apnea   . Tremor, essential     Patient Active Problem List   Diagnosis Date Noted  . Hypertension 09/23/2017  . Hyperlipidemia 09/23/2017  . Hematoma of leg, right, subsequent encounter 09/22/2017  . Blood blister 09/22/2017  . Weakness 08/31/2017  . Varicose veins of lower extremities with ulcer (HCC) 06/17/2016  . Chronic venous insufficiency 06/17/2016  . Lymphedema 06/17/2016  . Acute on chronic diastolic CHF (congestive heart failure) (HCC) 06/10/2016  . CHF (congestive heart failure) (HCC) 12/15/2015    Past Surgical History:  Procedure Laterality Date  . APPLICATION OF WOUND VAC Right 11/13/2017   Procedure: APPLICATION OF  WOUND VAC;  Surgeon: Annice Needy, MD;  Location: [facility] ORS;  Service: Vascular;  Laterality: Right;  . BACK SURGERY  06/2010  . CARDIAC CATHETERIZATION Left 04/30/2016   Procedure: Left Heart Cath and Coronary Angiography;  Surgeon: Laurier Nancy, MD;  Location: [facility] INVASIVE CV LAB;  Service: Cardiovascular;  Laterality: Left;  . COLONOSCOPY    . CORONARY ANGIOPLASTY    . KNEE ARTHROSCOPY Left   . KNEE SURGERY Left 1998  . TONSILLECTOMY    . WOUND DEBRIDEMENT Right 11/13/2017   Procedure: DEBRIDEMENT WOUND;  Surgeon: Annice Needy, MD;  Location: [facility] ORS;  Service: Vascular;  Laterality: Right;    Prior to Admission medications   Medication Sig Start Date End Date Taking? Authorizing Provider  aspirin EC 81 MG tablet Take 81 mg by mouth daily.    [provider]  atorvastatin (LIPITOR) 40 MG tablet Take 40 mg by mouth every evening.    [provider]  carvedilol (COREG) 12.5 MG tablet Take 12.5 mg by mouth 2 (two) times daily.    [provider]  cephALEXin (KEFLEX) 500 MG capsule Take 1 capsule (500 mg total) by mouth 4 (four) times daily. Patient taking differently: Take 500 mg by mouth 2 (two) times daily.  10/16/17   Stegmayer, Ranae Plumber, PA-C  Chlorpheniramine Maleate (ALLERGY PO) Take 1 tablet by mouth daily as needed (allergies).    [provider]  docusate sodium (COLACE) 100 MG capsule  Take 200 mg by mouth at bedtime.     [provider]  gabapentin (NEURONTIN) 300 MG capsule Take 300 mg by mouth daily.    [provider]  gabapentin (NEURONTIN) 400 MG capsule Take 400 mg by mouth at bedtime.    [provider]  hydrochlorothiazide (HYDRODIURIL) 25 MG tablet Take 25 mg by mouth daily.  10/21/17   [provider]  Infant Care Products (DERMACLOUD) CREA Apply liberal amount topically to area of skin irritation as needed. Ok to leave at bedside.    [provider]  ipratropium-albuterol (DUONEB) 0.5-2.5  (3) MG/3ML SOLN Take 3 mLs by nebulization every 6 (six) hours as needed (shortness of breath).     [provider]  isosorbide mononitrate (IMDUR) 30 MG 24 hr tablet Take 30 mg by mouth daily.  05/31/16   [provider]  lactose free nutrition (BOOST) LIQD Take 237 mLs by mouth daily.    [provider]  lisinopril (PRINIVIL,ZESTRIL) 5 MG tablet Take 5 mg by mouth daily.     [provider]  Multiple Vitamins-Minerals (CENTROVITE) TABS Take 1 tablet by mouth daily.    [provider]  nitroGLYCERIN (NITROSTAT) 0.3 MG SL tablet Place 0.3 mg under the tongue every 5 (five) minutes as needed for chest pain.    [provider]  Potassium 99 MG TABS Take 99 mg by mouth daily.    [provider]  primidone (MYSOLINE) 50 MG tablet Take 200 mg by mouth 2 (two) times daily. 4 tabs    [provider]  Skin Protectants, Misc. (ENDIT EX) Apply topically to sacrum/buttocks 2 times daily and as needed for irritation    [provider]  spironolactone (ALDACTONE) 25 MG tablet Take 12.5 mg by mouth daily.     [provider]  torsemide (DEMADEX) 20 MG tablet Take 20 mg by mouth daily.     [provider]  traMADol (ULTRAM) 50 MG tablet Take 1 tablet (50 mg total) by mouth every 4 (four) hours as needed. Patient taking differently: Take 50-100 mg by mouth every 4 (four) hours as needed for moderate pain.  09/24/17   Lorenso QuarryLeach, Shannon, NP  umeclidinium-vilanterol (ANORO ELLIPTA) 62.5-25 MCG/INH AEPB Inhale 1 puff into the lungs daily. 09/04/17   Shaune Pollackhen, Qing, MD    Allergies Patient has no known allergies.  Family History  Problem Relation Age of Onset  . Diabetes Son   . Cancer Mother   . Colon cancer Mother   . Lung cancer Mother   . Tremor Father   . Heart disease Father   . Tremor Brother     Social History Social History   Tobacco Use  . Smoking status: Light Tobacco Smoker    Packs/day: 0.25    Years:  58.00    Pack years: 14.50    Types: Cigarettes  . Smokeless tobacco: Former NeurosurgeonUser  . Tobacco comment: 1 cigarette per day  Substance Use Topics  . Alcohol use: No    Alcohol/week: 0.0 oz  . Drug use: No    Review of Systems  Constitutional: Positive for fever. Eyes: No redness. ENT: No neck pain. Cardiovascular: Denies chest pain. Respiratory: Denies shortness of breath. Gastrointestinal: No nausea, no vomiting.  No diarrhea.  Genitourinary: Negative for dysuria.  Musculoskeletal: Positive for right lower leg pain. Skin: Positive for right lower leg rash. Neurological: Negative for headache.   ____________________________________________   PHYSICAL EXAM:  VITAL SIGNS: ED Triage Vitals  Enc Vitals Group     BP 12/01/17 1215 137/62     Pulse Rate 12/01/17 1215 73     Resp 12/01/17 1215 18     Temp 12/01/17 1215 100 F (37.8 C)     Temp Source 12/01/17 1215 Oral     SpO2 12/01/17 1215 99 %     Weight --      Height --      Head Circumference --      Peak Flow --      Pain Score 12/01/17 1216 0     Pain Loc --      Pain Edu? --      Excl. in GC? --     Constitutional: Somewhat tired appearing but alert and oriented x4. Well appearing and in no acute distress. Eyes: Conjunctivae are normal.  Head: Atraumatic. Nose: No congestion/rhinnorhea. Mouth/Throat: Mucous membranes are somewhat dry.   Neck: Normal range of motion.  Cardiovascular: Normal rate, regular rhythm. Grossly normal heart sounds.  Good peripheral circulation. Respiratory: Normal respiratory effort.  No retractions.  Slightly decreased breath sounds bilaterally.   Gastrointestinal: Soft and nontender. No distention.  Genitourinary: No flank tenderness. Musculoskeletal: Chronic appearing bilateral lower extremity edema.  Wound VAC in place on right lower lateral leg.  Extremities warm and well perfused.  Neurologic: Motor intact in all extremities. Skin:  Skin is warm and dry.  Right lower leg with  erythema with some induration and warmth from approximately one third the way down the calf to the ankle. Psychiatric: Mood and affect are normal. Speech and behavior are normal.  ____________________________________________   LABS (all labs ordered are listed, but only abnormal results are displayed)  Labs Reviewed  LACTIC ACID, PLASMA - Abnormal; Notable for the following components:      Result Value   Lactic Acid, Venous 2.4 (*)    All other components within normal limits  COMPREHENSIVE METABOLIC PANEL - Abnormal; Notable for the following components:   Potassium 3.0 (*)    Chloride 100 (*)    Glucose, Bld 160 (*)    BUN 53 (*)    Creatinine, Ser 1.80 (*)    Calcium 8.3 (*)    Albumin 3.2 (*)    AST 45 (*)    GFR calc non Af Amer 34 (*)    GFR calc Af Amer 39 (*)    All other components within normal limits  CBC WITH DIFFERENTIAL/PLATELET - Abnormal; Notable for the following components:   WBC 16.3 (*)    RBC 3.84 (*)    Hemoglobin 12.0 (*)    HCT 36.1 (*)    RDW 16.7 (*)    Platelets 129 (*)    Neutro Abs 13.6 (*)    Monocytes Absolute 1.2 (*)    All other components within normal limits  TROPONIN I - Abnormal; Notable for the following components:   Troponin I 0.12 (*)    All other components within normal limits  CULTURE, BLOOD (ROUTINE X 2)  CULTURE, BLOOD (ROUTINE X 2)  LACTIC ACID, PLASMA  INFLUENZA PANEL BY PCR (TYPE A & B)  URINALYSIS, COMPLETE (UACMP) WITH MICROSCOPIC   ____________________________________________  EKG  ED ECG REPORT I, Dionne Bucy, the attending physician, personally viewed and interpreted this ECG.  Date: 12/01/2017 EKG Time: 1259 Rate: 77 Rhythm: normal sinus rhythm with PVCs QRS Axis: normal Intervals: Short PR, RBBB ST/T Wave abnormalities: normal Narrative Interpretation: no evidence of acute ischemia  ____________________________________________  RADIOLOGY  CXR:  Left lower lobe infiltrate XR RLE: No fracture  or evidence of osteomyelitis ____________________________________________   PROCEDURES  Procedure(s) performed: No  Procedures  Critical Care performed: No ____________________________________________   INITIAL IMPRESSION / ASSESSMENT AND PLAN / ED COURSE  Pertinent labs & imaging results that were available during my care of the patient were reviewed by me and considered in my medical decision making (see chart for details).  82 year old male with past medical history as noted above presents with increased generalized weakness over the last day as well as fever at home, and a fall today with no specific trauma except for abrasion to left arm.  On exam, the patient has a borderline temperature but otherwise normal vital signs, and the remainder the exam is as described above.  There is erythema and warmth to the right lower extremity; the sons are not completely sure whether this is new but they believe that it is a change from his baseline.  I reviewed the past medical records in Epic and confirmed that the patient has had I&D of right calf wound and that the wound VAC is in place because of this.  Differential is broad but includes primarily infectious causes, primarily cellulitis of the right lower extremity, pneumonia, UTI, viral syndrome, or less likely noninfectious cause such as dehydration, electrolyte abnormality, or other metabolic cause.  We will obtain basic labs, infection workup, x-ray of the right lower extremity, and reassess.  Anticipate the patient will need likely admission.    ----------------------------------------- 4:10 PM on 12/01/2017 -----------------------------------------  Workup reveals elevated WBC, elevated lactate, and left lower lobe infiltrate.  X-ray of the right lower extremity is negative, and the flu test is also negative.  Clinically, patient may still have cellulitis in addition to the left lower lobe infiltrate.  We will treat with  broad-spectrum Intermedics as the patient was hospitalized 2 months ago.  He is receiving IV fluids for the elevated lactate and borderline blood pressures.  I signed the patient out for admission to the hospitalist Dr. Elisabeth Pigeon.  ____________________________________________   FINAL CLINICAL IMPRESSION(S) / ED DIAGNOSES  Final diagnoses:  Healthcare-associated pneumonia  Cellulitis of right lower extremity      NEW MEDICATIONS STARTED DURING THIS VISIT:  New Prescriptions   No medications on file     Note:  This document was prepared using Dragon voice recognition software and may include unintentional dictation errors.    Dionne Bucy, MD 12/01/17 [phone]

## 2017-12-01 NOTE — ED Triage Notes (Signed)
Pt to ED via EMS from home with c/o LFT knee pain. Per EMS family is no longer able to care for pt at home and is looking for placement. Pt has wound vac noted to RT lower extremely. PT A&OX4

## 2017-12-01 NOTE — Progress Notes (Signed)
Family Meeting Note  Advance Directive:yes  Today a meeting took place with the Patient, spouse and son.   The following clinical team members were present during this meeting:MD  The following were discussed:Patient's diagnosis: Sepsis, cellulitis, Pneumonia, Patient's progosis: Unable to determine and Goals for treatment: Full Code  Additional follow-up to be provided: vascular  Time spent during discussion:20 minutes  Altamese DillingVaibhavkumar Linday Rhodes, MD

## 2017-12-01 NOTE — ED Notes (Signed)
Family states pt has been having multiple falls and has become weak and unable to get around like normal. Pt states he has been feeling weak. Pt is in and out of sleeping, per family he has been up since 5am from falling and has had a rough morning.

## 2017-12-02 ENCOUNTER — Ambulatory Visit (INDEPENDENT_AMBULATORY_CARE_PROVIDER_SITE_OTHER): Payer: Medicare Other | Admitting: Vascular Surgery

## 2017-12-02 DIAGNOSIS — L8990 Pressure ulcer of unspecified site, unspecified stage: Secondary | ICD-10-CM

## 2017-12-02 LAB — BASIC METABOLIC PANEL
Anion gap: 9 (ref 5–15)
BUN: 50 mg/dL — AB (ref 6–20)
CHLORIDE: 104 mmol/L (ref 101–111)
CO2: 28 mmol/L (ref 22–32)
Calcium: 8 mg/dL — ABNORMAL LOW (ref 8.9–10.3)
Creatinine, Ser: 1.59 mg/dL — ABNORMAL HIGH (ref 0.61–1.24)
GFR calc Af Amer: 46 mL/min — ABNORMAL LOW (ref >60)
GFR calc non Af Amer: 39 mL/min — ABNORMAL LOW (ref >60)
GLUCOSE: 106 mg/dL — AB (ref 65–99)
POTASSIUM: 3 mmol/L — AB (ref 3.5–5.1)
Sodium: 141 mmol/L (ref 135–145)

## 2017-12-02 LAB — URINALYSIS, COMPLETE (UACMP) WITH MICROSCOPIC
BILIRUBIN URINE: NEGATIVE
Bacteria, UA: NONE SEEN
GLUCOSE, UA: NEGATIVE mg/dL
Hgb urine dipstick: NEGATIVE
KETONES UR: NEGATIVE mg/dL
Leukocytes, UA: NEGATIVE
NITRITE: NEGATIVE
PH: 5 (ref 5.0–8.0)
Protein, ur: NEGATIVE mg/dL
Specific Gravity, Urine: 1.013 (ref 1.005–1.030)
Squamous Epithelial / LPF: NONE SEEN

## 2017-12-02 LAB — MRSA PCR SCREENING: MRSA by PCR: NEGATIVE

## 2017-12-02 LAB — CBC
HCT: 35 % — ABNORMAL LOW (ref 40.0–52.0)
Hemoglobin: 11.7 g/dL — ABNORMAL LOW (ref 13.0–18.0)
MCH: 31.6 pg (ref 26.0–34.0)
MCHC: 33.5 g/dL (ref 32.0–36.0)
MCV: 94.4 fL (ref 80.0–100.0)
Platelets: 122 10*3/uL — ABNORMAL LOW (ref 150–440)
RBC: 3.71 MIL/uL — ABNORMAL LOW (ref 4.40–5.90)
RDW: 16.8 % — ABNORMAL HIGH (ref 11.5–14.5)
WBC: 11.4 10*3/uL — ABNORMAL HIGH (ref 3.8–10.6)

## 2017-12-02 MED ORDER — SODIUM CHLORIDE 0.9 % IV SOLN
1.0000 g | Freq: Every day | INTRAVENOUS | Status: DC
  Administered 2017-12-02 – 2017-12-05 (×4): 1 g via INTRAVENOUS
  Filled 2017-12-02 (×4): qty 10

## 2017-12-02 MED ORDER — DIPHENHYDRAMINE HCL 25 MG PO CAPS
50.0000 mg | ORAL_CAPSULE | Freq: Three times a day (TID) | ORAL | Status: DC | PRN
  Administered 2017-12-03 – 2017-12-04 (×4): 50 mg via ORAL
  Filled 2017-12-02 (×4): qty 2

## 2017-12-02 MED ORDER — AZITHROMYCIN 250 MG PO TABS
250.0000 mg | ORAL_TABLET | Freq: Every day | ORAL | Status: DC
  Administered 2017-12-03 – 2017-12-04 (×2): 250 mg via ORAL
  Filled 2017-12-02 (×2): qty 1

## 2017-12-02 MED ORDER — AZITHROMYCIN 250 MG PO TABS
500.0000 mg | ORAL_TABLET | Freq: Once | ORAL | Status: AC
  Administered 2017-12-02: 500 mg via ORAL
  Filled 2017-12-02: qty 2

## 2017-12-02 MED ORDER — ACETAMINOPHEN 325 MG PO TABS
650.0000 mg | ORAL_TABLET | Freq: Four times a day (QID) | ORAL | Status: DC | PRN
  Administered 2017-12-02 – 2017-12-05 (×4): 650 mg via ORAL
  Filled 2017-12-02 (×4): qty 2

## 2017-12-02 MED ORDER — NAPHAZOLINE-PHENIRAMINE 0.025-0.3 % OP SOLN
1.0000 [drp] | Freq: Four times a day (QID) | OPHTHALMIC | Status: DC | PRN
  Administered 2017-12-02 – 2017-12-05 (×9): 1 [drp] via OPHTHALMIC
  Filled 2017-12-02: qty 5

## 2017-12-02 NOTE — Progress Notes (Signed)
Called Dr. Caryn BeeMaier regarding medication for itchy eyes.  Appropriate orders were placed.  Arturo MortonClay, Casey Fye N 12/02/2017  11:24 PM

## 2017-12-02 NOTE — Progress Notes (Signed)
PT Cancellation Note  Patient Details Name: Dylan [name] MRN: [id number] DOB: Feb 12, 1936   Cancelled Treatment:    Reason Eval/Treat Not Completed: Patient declined, no reason specified. Order received.  Chart reviewed.  Pt refused several times, stating that he "had help" last time he tried to get from bed to chair and "ended up in the floor".  PT educated pt concerning PT ability concerning transfers and all an evaluation entails and pt stated that he would like to wait until after his Dr. Florestine Aversomes by to check his wound.  PT will re-attempt if time allows.   Glenetta HewSarah Zailynn Brandel, PT, DPT 12/02/2017, 11:06 AM

## 2017-12-02 NOTE — Progress Notes (Signed)
Patient ID: Dylan [name], male   DOB: 27-Apr-1936, 82 y.o.   MRN: [id number]  [employer] PROGRESS NOTE  Dylan [name] XBJ:[id number] DOB: 12-18-1935 DOA: 12/01/2017 PCP: Dylan Shaggy, MD  HPI/Subjective: Patient having watery itchy eyes that improved with the eyedrop.  Patient had some cough.  Had a fall and could not get up.  He has been having lots of falls recently.  He is scared about falling.  Objective: Vitals:   12/02/17 0441 12/02/17 1306  BP: 124/64   Pulse:  69  Resp:  20  Temp:  98.4 F (36.9 C)  SpO2:  100%    Filed Weights   12/01/17 1800 12/01/17 1930  Weight: 127 kg (280 lb) 135.7 kg (299 lb 2.6 oz)    ROS: Review of Systems  Constitutional: Negative for chills and fever.  Eyes: Negative for blurred vision.  Respiratory: Positive for cough. Negative for shortness of breath.   Cardiovascular: Negative for chest pain.  Gastrointestinal: Negative for abdominal pain, constipation, diarrhea, nausea and vomiting.  Genitourinary: Negative for dysuria.  Musculoskeletal: Negative for joint pain.  Neurological: Negative for dizziness and headaches.   Exam: Physical Exam  Constitutional: He is oriented to person, place, and time.  HENT:  Nose: No mucosal edema.  Mouth/Throat: No oropharyngeal exudate or posterior oropharyngeal edema.  Eyes: Pupils are equal, round, and reactive to light. Conjunctivae and EOM are normal.  Some crusting around the left lid  Neck: No JVD present. Carotid bruit is not present. No edema present. No thyroid mass and no thyromegaly present.  Cardiovascular: S1 normal and S2 normal. Exam reveals no gallop.  No murmur heard. Pulses:      Dorsalis pedis pulses are 2+ on the right side, and 2+ on the left side.  Respiratory: No respiratory distress. He has decreased breath sounds in the right lower field and the left lower field. He has no wheezes. He has no rhonchi. He has no rales.  GI: Soft. Bowel sounds are normal. There is no  tenderness.  Musculoskeletal:       Right ankle: He exhibits swelling.       Left ankle: He exhibits swelling.  Lymphadenopathy:    He has no cervical adenopathy.  Neurological: He is alert and oriented to person, place, and time. No cranial nerve deficit.  Skin: Skin is warm. Nails show no clubbing.  Wound VAC around right leg.  Chronic lower extremity discoloration  Psychiatric: He has a normal mood and affect.      Data Reviewed: Basic Metabolic Panel: Recent Labs  Lab 12/01/17 1253 12/02/17 0512  NA 138 141  K 3.0* 3.0*  CL 100* 104  CO2 28 28  GLUCOSE 160* 106*  BUN 53* 50*  CREATININE 1.80* 1.59*  CALCIUM 8.3* 8.0*   Liver Function Tests: Recent Labs  Lab 12/01/17 1253  AST 45*  ALT 44  ALKPHOS 93  BILITOT 0.7  PROT 6.5  ALBUMIN 3.2*   CBC: Recent Labs  Lab 12/01/17 1253 12/02/17 0512  WBC 16.3* 11.4*  NEUTROABS 13.6*  --   HGB 12.0* 11.7*  HCT 36.1* 35.0*  MCV 93.9 94.4  PLT 129* 122*   Cardiac Enzymes: Recent Labs  Lab 12/01/17 1253  TROPONINI 0.12*     Recent Results (from the past 240 hour(s))  Blood Culture (routine x 2)     Status: None (Preliminary result)   Collection Time: 12/01/17 12:53 PM  Result Value Ref Range Status   Specimen Description  BLOOD RIGHT ARM  Final   Special Requests   Final    BOTTLES DRAWN AEROBIC AND ANAEROBIC Blood Culture results may not be optimal due to an excessive volume of blood received in culture bottles   Culture   Final    NO GROWTH < 24 HOURS Performed at [facility], [address] [place], [place] [address]    Report Status PENDING  Incomplete  Blood Culture (routine x 2)     Status: None (Preliminary result)   Collection Time: 12/01/17  3:55 PM  Result Value Ref Range Status   Specimen Description BLOOD BLOOD LEFT ARM  Final   Special Requests   Final    BOTTLES DRAWN AEROBIC AND ANAEROBIC Blood Culture adequate volume   Culture   Final    NO GROWTH < 24 HOURS Performed at  [facility], [address], [place], [place] [address]    Report Status PENDING  Incomplete  MRSA PCR Screening     Status: None   Collection Time: 12/02/17  5:58 AM  Result Value Ref Range Status   MRSA by PCR NEGATIVE NEGATIVE Final    Comment:        The GeneXpert MRSA Assay (FDA approved for NASAL specimens only), is one component of a comprehensive MRSA colonization surveillance program. It is not intended to diagnose MRSA infection nor to guide or monitor treatment for MRSA infections. Performed at [facility], [address] [place], [place] [address]      Studies: Dg Tibia/fibula Right  Result Date: 12/01/2017 CLINICAL DATA:  Patient's family reports that he has been weak since yesterday and had a temperature of 101 yesterday. Family states patient is more pale than usual and that patient had a fall this morning. Family states they are unsure of whether patient hit his head. Wound vac right lower leg in place since 11/13/2017 for non-healing ulcer. EXAM: RIGHT TIBIA AND FIBULA - 2 VIEW COMPARISON:  08/31/2017 FINDINGS: No fracture. No bone resorption is seen to suggest osteomyelitis. There is area of sclerosis in the distal femur consistent with a healed cartilaginous lesion, stable from the prior exam. Knee and ankle joints are normally aligned. Wound VAC device projects over the lateral mid leg. No underlying mass or soft tissue air. There are soft tissue calcifications most evident along the medial lower leg to ankle. There are also vascular calcifications. IMPRESSION: 1. No fracture or evidence of osteomyelitis. 2. No soft tissue air. Electronically Signed   By: Amie Portland M.D.   On: 12/01/2017 14:59   Dg Chest Portable 1 View  Result Date: 12/01/2017 CLINICAL DATA:  Weakness and fever. EXAM: PORTABLE CHEST 1 VIEW COMPARISON:  09/02/2017 FINDINGS: Mildly enlarged cardiac silhouette. Calcific atherosclerotic disease of the aorta. There is no evidence of  pleural effusion or pneumothorax. Mild haziness of the left lung base may represent subtle airspace consolidation. Osseous structures are without acute abnormality. Soft tissues are grossly normal. IMPRESSION: Possible left lower lobe airspace consolidation. Electronically Signed   By: Ted Mcalpine M.D.   On: 12/01/2017 14:56    Scheduled Meds: . aspirin EC  81 mg Oral Daily  . atorvastatin  40 mg Oral QPM  . azithromycin  500 mg Oral Once   Followed by  . [START ON 12/03/2017] azithromycin  250 mg Oral Daily  . docusate sodium  200 mg Oral QHS  . gabapentin  400 mg Oral QHS  . heparin  5,000 Units Subcutaneous Q8H  .  lactose free nutrition  237 mL Oral Daily  . potassium chloride  20 mEq Oral BID  . umeclidinium-vilanterol  1 puff Inhalation Daily   Continuous Infusions: . cefTRIAXone (ROCEPHIN)  IV      Assessment/Plan:  1. Clinical sepsis with pneumonia.  Switch antibiotics to Rocephin and Zithromax. 2. Chronic ulcer wound right lower extremity.  Patient has a wound VAC.  Operated by Dr. Wyn Quakerew recently. 3. Acute kidney injury on chronic kidney disease stage III.  Received IV fluids overnight.  With history of CHF I will hold off on IV fluids. 4. Relative hypotension last night.  This has improved with IV fluids.  Continue to hold antihypertensive medications 5. Weakness and falls.  Physical therapy evaluation. 6. Hyperlipidemia unspecified on atorvastatin 7. Neuropathy on gabapentin 8. Morbid obesity.  Weight loss needed  Code Status:     Code Status Orders  (From admission, onward)        Start     Ordered   12/01/17 1924  Full code  Continuous     12/01/17 1923    Code Status History    Date Active Date Inactive Code Status Order ID Comments User Context   08/31/2017 0557 09/05/2017 1959 Full Code [id number]  Arnaldo Nataliamond, Michael S, MD Inpatient   06/10/2016 1934 06/13/2016 1816 Full Code [id number]  Shaune Pollackhen, Qing, MD Inpatient   12/15/2015 0134 12/16/2015 1507 Full Code  [id number]  Ihor AustinPyreddy, Pavan, MD ED    Advance Directive Documentation     Most Recent Value  Type of Advance Directive  Healthcare Power of Attorney, Living will  Pre-existing out of facility DNR order (yellow form or pink MOST form)  -  "MOST" Form in Place?  -     Family Communication: Spoke with son at the bedside Disposition Plan: To be determined  Consultants:  Vascular surgery  Antibiotics:  Rocephin  Zithromax  Time spent: 28 minutes  Jason Frisbee [employer]  [employer]

## 2017-12-03 ENCOUNTER — Inpatient Hospital Stay: Admission: RE | Admit: 2017-12-03 | Payer: Medicare Other | Source: Ambulatory Visit

## 2017-12-03 DIAGNOSIS — I10 Essential (primary) hypertension: Secondary | ICD-10-CM

## 2017-12-03 DIAGNOSIS — R0602 Shortness of breath: Secondary | ICD-10-CM

## 2017-12-03 DIAGNOSIS — S81801D Unspecified open wound, right lower leg, subsequent encounter: Secondary | ICD-10-CM

## 2017-12-03 DIAGNOSIS — J449 Chronic obstructive pulmonary disease, unspecified: Secondary | ICD-10-CM

## 2017-12-03 DIAGNOSIS — N189 Chronic kidney disease, unspecified: Secondary | ICD-10-CM

## 2017-12-03 LAB — BASIC METABOLIC PANEL
ANION GAP: 8 (ref 5–15)
BUN: 49 mg/dL — ABNORMAL HIGH (ref 6–20)
CALCIUM: 7.7 mg/dL — AB (ref 8.9–10.3)
CO2: 25 mmol/L (ref 22–32)
Chloride: 106 mmol/L (ref 101–111)
Creatinine, Ser: 1.63 mg/dL — ABNORMAL HIGH (ref 0.61–1.24)
GFR, EST AFRICAN AMERICAN: 44 mL/min — AB (ref >60)
GFR, EST NON AFRICAN AMERICAN: 38 mL/min — AB (ref >60)
Glucose, Bld: 116 mg/dL — ABNORMAL HIGH (ref 65–99)
POTASSIUM: 2.8 mmol/L — AB (ref 3.5–5.1)
Sodium: 139 mmol/L (ref 135–145)

## 2017-12-03 MED ORDER — HYDROCODONE-ACETAMINOPHEN 5-325 MG PO TABS
1.0000 | ORAL_TABLET | Freq: Four times a day (QID) | ORAL | Status: DC | PRN
  Administered 2017-12-03 – 2017-12-05 (×2): 1 via ORAL
  Filled 2017-12-03 (×2): qty 1

## 2017-12-03 MED ORDER — SPIRONOLACTONE 25 MG PO TABS
12.5000 mg | ORAL_TABLET | Freq: Every day | ORAL | Status: DC
  Administered 2017-12-03 – 2017-12-05 (×3): 12.5 mg via ORAL
  Filled 2017-12-03: qty 1
  Filled 2017-12-03: qty 0.5
  Filled 2017-12-03: qty 1
  Filled 2017-12-03 (×2): qty 0.5
  Filled 2017-12-03: qty 1

## 2017-12-03 MED ORDER — POTASSIUM CHLORIDE CRYS ER 20 MEQ PO TBCR
40.0000 meq | EXTENDED_RELEASE_TABLET | Freq: Once | ORAL | Status: AC
  Administered 2017-12-03: 40 meq via ORAL
  Filled 2017-12-03: qty 2

## 2017-12-03 MED ORDER — TORSEMIDE 20 MG PO TABS
20.0000 mg | ORAL_TABLET | Freq: Every day | ORAL | Status: DC
  Administered 2017-12-03 – 2017-12-04 (×2): 20 mg via ORAL
  Filled 2017-12-03 (×2): qty 1

## 2017-12-03 MED ORDER — POTASSIUM CHLORIDE CRYS ER 20 MEQ PO TBCR
40.0000 meq | EXTENDED_RELEASE_TABLET | Freq: Once | ORAL | Status: DC
  Filled 2017-12-03: qty 2

## 2017-12-03 MED ORDER — MAGNESIUM SULFATE 2 GM/50ML IV SOLN
2.0000 g | Freq: Once | INTRAVENOUS | Status: AC
  Administered 2017-12-03: 2 g via INTRAVENOUS
  Filled 2017-12-03: qty 50

## 2017-12-03 NOTE — Plan of Care (Signed)
Patient has PT eval in the morning due to weakness and history of falls.  Dylan [name], [name] N

## 2017-12-03 NOTE — Progress Notes (Signed)
Patient ID: Dylan [name], male   DOB: Nov 19, 1935, 82 y.o.   MRN: [id number]  [employer] [facility] PROGRESS NOTE  FINAS DELONE WUJ:[id number] DOB: Aug 10, 1936 DOA: 12/01/2017 PCP: [name], MD  HPI/Subjective: Patient awakened from sleep.  He had a fever last night.  Still feeling weak.  Still having a cough.  Objective: Vitals:   12/03/17 0509 12/03/17 1331  BP:  112/61  Pulse:  (!) 54  Resp:    Temp: 99.8 F (37.7 C) 98.5 F (36.9 C)  SpO2:  98%    Filed Weights   12/01/17 1800 12/01/17 1930 12/03/17 0500  Weight: 127 kg (280 lb) 135.7 kg (299 lb 2.6 oz) (!) 141 kg (310 lb 14.4 oz)    ROS: Review of Systems  Constitutional: Negative for chills and fever.  Eyes: Negative for blurred vision.  Respiratory: Positive for cough. Negative for shortness of breath.   Cardiovascular: Negative for chest pain.  Gastrointestinal: Negative for abdominal pain, constipation, diarrhea, nausea and vomiting.  Genitourinary: Negative for dysuria.  Musculoskeletal: Negative for joint pain.  Neurological: Negative for dizziness and headaches.   Exam: Physical Exam  Constitutional: He is oriented to person, place, and time.  HENT:  Nose: No mucosal edema.  Mouth/Throat: No oropharyngeal exudate or posterior oropharyngeal edema.  Eyes: Pupils are equal, round, and reactive to light. Conjunctivae, EOM and lids are normal.  Some crusting around the left lid  Neck: No JVD present. Carotid bruit is not present. No edema present. No thyroid mass and no thyromegaly present.  Cardiovascular: S1 normal and S2 normal. Exam reveals no gallop.  No murmur heard. Pulses:      Dorsalis pedis pulses are 2+ on the right side, and 2+ on the left side.  Respiratory: No respiratory distress. He has decreased breath sounds in the right lower field and the left lower field. He has no wheezes. He has no rhonchi. He has no rales.  GI: Soft. Bowel sounds are normal. There is no tenderness.  Musculoskeletal:    Right shoulder: He exhibits no swelling.       Right ankle: He exhibits swelling.       Left ankle: He exhibits swelling.  Lymphadenopathy:    He has no cervical adenopathy.  Neurological: He is alert and oriented to person, place, and time. No cranial nerve deficit.  Skin: Skin is warm. No rash noted. Nails show no clubbing.  Wound VAC around right leg.  Chronic lower extremity discoloration  Psychiatric: He has a normal mood and affect.      Data Reviewed: Basic Metabolic Panel: Recent Labs  Lab 12/01/17 1253 12/02/17 0512 12/03/17 0756  NA 138 141 139  K 3.0* 3.0* 2.8*  CL 100* 104 106  CO2 28 28 25   GLUCOSE 160* 106* 116*  BUN 53* 50* 49*  CREATININE 1.80* 1.59* 1.63*  CALCIUM 8.3* 8.0* 7.7*   Liver Function Tests: Recent Labs  Lab 12/01/17 1253  AST 45*  ALT 44  ALKPHOS 93  BILITOT 0.7  PROT 6.5  ALBUMIN 3.2*   CBC: Recent Labs  Lab 12/01/17 1253 12/02/17 0512  WBC 16.3* 11.4*  NEUTROABS 13.6*  --   HGB 12.0* 11.7*  HCT 36.1* 35.0*  MCV 93.9 94.4  PLT 129* 122*   Cardiac Enzymes: Recent Labs  Lab 12/01/17 1253  TROPONINI 0.12*     Recent Results (from the past 240 hour(s))  Blood Culture (routine x 2)     Status: None (Preliminary result)  Collection Time: 12/01/17 12:53 PM  Result Value Ref Range Status   Specimen Description BLOOD RIGHT ARM  Final   Special Requests   Final    BOTTLES DRAWN AEROBIC AND ANAEROBIC Blood Culture results may not be optimal due to an excessive volume of blood received in culture bottles   Culture   Final    NO GROWTH 2 DAYS Performed at [facility], [address] [place], [place] [address]    Report Status PENDING  Incomplete  Blood Culture (routine x 2)     Status: None (Preliminary result)   Collection Time: 12/01/17  3:55 PM  Result Value Ref Range Status   Specimen Description BLOOD BLOOD LEFT ARM  Final   Special Requests   Final    BOTTLES DRAWN AEROBIC AND ANAEROBIC Blood Culture  adequate volume   Culture   Final    NO GROWTH 2 DAYS Performed at [facility], [address] [place], [place] [address]    Report Status PENDING  Incomplete  MRSA PCR Screening     Status: None   Collection Time: 12/02/17  5:58 AM  Result Value Ref Range Status   MRSA by PCR NEGATIVE NEGATIVE Final    Comment:        The GeneXpert MRSA Assay (FDA approved for NASAL specimens only), is one component of a comprehensive MRSA colonization surveillance program. It is not intended to diagnose MRSA infection nor to guide or monitor treatment for MRSA infections. Performed at [facility], [address], [place], [place] [address]      Studies: No results found.  Scheduled Meds: . aspirin EC  81 mg Oral Daily  . atorvastatin  40 mg Oral QPM  . azithromycin  250 mg Oral Daily  . docusate sodium  200 mg Oral QHS  . gabapentin  400 mg Oral QHS  . heparin  5,000 Units Subcutaneous Q8H  . lactose free nutrition  237 mL Oral Daily  . potassium chloride  40 mEq Oral Once  . potassium chloride  40 mEq Oral Once  . spironolactone  12.5 mg Oral Daily  . torsemide  20 mg Oral Daily  . umeclidinium-vilanterol  1 puff Inhalation Daily   Continuous Infusions: . cefTRIAXone (ROCEPHIN)  IV Stopped (12/03/17 1035)  . magnesium sulfate 1 - 4 g bolus IVPB      Assessment/Plan:  1. Clinical sepsis with pneumonia.   continue antibiotics to Rocephin and Zithromax. 2. Chronic ulcer wound right lower extremity.  Patient has a wound VAC.   appreciate vascular consultation 3. Acute kidney injury on chronic kidney disease stage III.  IV fluids stopped yesterday.  Continue to monitor kidney function 4. essential hypertension and weight gain.  Restart torsemide and Aldactone. 5. Hypokalemia give IV magnesium and oral potassium recheck BMP tomorrow morning 6. Weakness and falls.  Physical therapy evaluation. 7. Hyperlipidemia unspecified on atorvastatin 8. Neuropathy on  gabapentin 9. Morbid obesity.  Weight loss needed  Code Status:     Code Status Orders  (From admission, onward)        Start     Ordered   12/01/17 1924  Full code  Continuous     12/01/17 1923    Code Status History    Date Active Date Inactive Code Status Order ID Comments User Context   08/31/2017 0557 09/05/2017 1959 Full Code [id number]  Arnaldo Natal, MD Inpatient   06/10/2016 1934 06/13/2016 1816 Full Code [id number]  Shaune Pollack,  MD Inpatient   12/15/2015 0134 12/16/2015 1507 Full Code [id number]  Ihor AustinPyreddy, Pavan, MD ED    Advance Directive Documentation     Most Recent Value  Type of Advance Directive  Healthcare Power of Attorney, Living will  Pre-existing out of facility DNR order (yellow form or pink MOST form)  -  "MOST" Form in Place?  -     Family Communication: Spoke with son at the bedside yesterday Disposition Plan:  to be determined  Consultants:  Vascular surgery  Antibiotics:  Rocephin  Zithromax  Time spent: 25 minutes  Ova Meegan [employer]  [employer]

## 2017-12-03 NOTE — Clinical Social Work Note (Signed)
Clinical Social Work Assessment  Patient Details  Name: Dylan ClintonJerry B Faro MRN: [id number] Date of Birth: Jan 30, 1936  Date of referral:  12/03/17               Reason for consult:  Discharge Planning                Permission sought to share information with:  [facility] Contact Representative Permission granted to share information::  Yes, Verbal Permission Granted  Name::        Agency::     Relationship::     Contact Information:     Housing/Transportation Living arrangements for the past 2 months:  Single Family Home Source of Information:  Patient Patient Interpreter Needed:  None Criminal Activity/Legal Involvement Pertinent to Current Situation/Hospitalization:  No - Comment as needed Significant Relationships:  Spouse Lives with:  Spouse Do you feel safe going back to the place where you live?  Yes Need for family participation in patient care:  Yes (Comment)  Care giving concerns:  Patient lives with his spouse.   Social Worker assessment / plan:  CSW informed by nursing staff that patient and family have stated that patient will need to go to [facility] for wound vac therapy and PT. Patient did not work with PT yesterday. CSW spoke with patient this morning and patient states he knows he needs to go somewhere for his PT and wound vac changes as he has fallen several times and is not able to care for himself. Patient states he prefers [facility].  Employment status:  Disabled (Comment on whether or not currently receiving Disability) Insurance information:  Managed Medicare PT Recommendations:    Information / Referral to community resources:     Patient/Family's Response to care:  Patient expressed appreciation for CSW assistance.  Patient/Family's Understanding of and Emotional Response to Diagnosis, Current Treatment, and Prognosis:  Patient is aware of his limitations.   Emotional Assessment Appearance:  Appears stated age Attitude/Demeanor/Rapport:  (pleasant and  cooperative) Affect (typically observed):  Calm, Adaptable, Accepting Orientation:  Oriented to Self, Oriented to Place, Oriented to  Time, Oriented to Situation Alcohol / Substance use:  Not Applicable Psych involvement (Current and /or in the community):  No (Comment)  Discharge Needs  Concerns to be addressed:  Care Coordination Readmission within the last 30 days:  No Current discharge risk:  None Barriers to Discharge:  No Barriers Identified   York SpanielMonica Kindle Strohmeier, LCSW 12/03/2017, 11:08 AM

## 2017-12-03 NOTE — Consult Note (Signed)
[facility] Vascular Consult Note  MRN : [id number]  Dylan [name] is a 82 y.o. (1936-06-30) male who presents with chief complaint of  Chief Complaint  Patient presents with  . Knee Pain  .  History of Present Illness:   I am asked to see the patient by Dr. Elisabeth Pigeon.  The patient is an 82 year old gentleman well-known to our service with a long history of venous stasis disease and venous insufficiency.  Earlier last month the patient was seen by Dr. Wyn Quaker for a nonhealing wound of the right calf.  This was a traumatic injury he sustained after a fall and subsequently developed a very large hematoma.  On November 13, 2017 he was taken to the operating room by Dr. Wyn Quaker where debridement of a 25 cm area was performed and a VAC dressing was placed.  He has been followed by home health and has been undergoing VAC dressing changes Monday Wednesdays and Fridays.  2 days ago he presented to the [facility] ER with increasing shortness of breath increasing lethargy and progressive weakness.  On evaluation in the emergency room he was noted to have findings consistent with a pneumonia as well as an elevated white blood cell count and changes of his right lower extremity that suggested an early cellulitis.  He has been admitted and initiated on appropriate antibiotic therapy.  After 24 hours he states he is feeling significantly better.  Current Facility-Administered Medications  Medication Dose Route Frequency Provider Last Rate Last Dose  . acetaminophen (TYLENOL) tablet 650 mg  650 mg Oral Q6H PRN Altamese Dilling, MD   650 mg at 12/03/17 0355  . aspirin EC tablet 81 mg  81 mg Oral Daily Altamese Dilling, MD   81 mg at 12/03/17 0959  . atorvastatin (LIPITOR) tablet 40 mg  40 mg Oral QPM Altamese Dilling, MD   40 mg at 12/02/17 1707  . azithromycin (ZITHROMAX) tablet 250 mg  250 mg Oral Daily Alford Highland, MD   250 mg at 12/03/17 0959  .  cefTRIAXone (ROCEPHIN) 1 g in sodium chloride 0.9 % 100 mL IVPB  1 g Intravenous Daily Alford Highland, MD 200 mL/hr at 12/03/17 1000 1 g at 12/03/17 1000  . diphenhydrAMINE (BENADRYL) capsule 50 mg  50 mg Oral Q8H PRN Cammy Copa, MD   50 mg at 12/03/17 1000  . docusate sodium (COLACE) capsule 100 mg  100 mg Oral BID PRN Altamese Dilling, MD      . docusate sodium (COLACE) capsule 200 mg  200 mg Oral QHS Altamese Dilling, MD   200 mg at 12/02/17 2110  . fluticasone (FLONASE) 50 MCG/ACT nasal spray 1 spray  1 spray Each Nare Daily PRN Altamese Dilling, MD      . gabapentin (NEURONTIN) capsule 400 mg  400 mg Oral QHS Altamese Dilling, MD   400 mg at 12/02/17 2110  . heparin injection 5,000 Units  5,000 Units Subcutaneous Q8H Altamese Dilling, MD   5,000 Units at 12/03/17 0507  . HYDROcodone-acetaminophen (NORCO/VICODIN) 5-325 MG per tablet 1 tablet  1 tablet Oral Q6H PRN Alford Highland, MD   1 tablet at 12/03/17 0959  . ipratropium-albuterol (DUONEB) 0.5-2.5 (3) MG/3ML nebulizer solution 3 mL  3 mL Nebulization Q6H PRN Altamese Dilling, MD      . lactose free nutrition (Boost) liquid 237 mL  237 mL Oral Daily Altamese Dilling, MD   237 mL at 12/02/17 1055  . naphazoline-pheniramine (NAPHCON-A) 0.025-0.3 % ophthalmic  solution 1 drop  1 drop Both Eyes QID PRN Alford HighlandWieting, Richard, MD   1 drop at 12/03/17 1000  . umeclidinium-vilanterol (ANORO ELLIPTA) 62.5-25 MCG/INH 1 puff  1 puff Inhalation Daily Altamese DillingVachhani, Vaibhavkumar, MD   1 puff at 12/03/17 [id number]    Past Medical History:  Diagnosis Date  . CAD (coronary artery disease)   . Cervicalgia   . CHF (congestive heart failure) (HCC)   . COPD (chronic obstructive pulmonary disease) (HCC)   . Diastolic heart failure (HCC)   . Foot drop, right   . History of kidney stones   . Hyperlipidemia    unspecified  . Hypertension   . Myocardial infarction (HCC)   . Osteoarthritis   . Shoulder pain, left   . Sleep  apnea   . Tremor, essential     Past Surgical History:  Procedure Laterality Date  . APPLICATION OF WOUND VAC Right 11/13/2017   Procedure: APPLICATION OF WOUND VAC;  Surgeon: Annice Needyew, Jason S, MD;  Location: [facility] ORS;  Service: Vascular;  Laterality: Right;  . BACK SURGERY  06/2010  . CARDIAC CATHETERIZATION Left 04/30/2016   Procedure: Left Heart Cath and Coronary Angiography;  Surgeon: Laurier NancyShaukat A Khan, MD;  Location: [facility] INVASIVE CV LAB;  Service: Cardiovascular;  Laterality: Left;  . COLONOSCOPY    . CORONARY ANGIOPLASTY    . KNEE ARTHROSCOPY Left   . KNEE SURGERY Left 1998  . TONSILLECTOMY    . WOUND DEBRIDEMENT Right 11/13/2017   Procedure: DEBRIDEMENT WOUND;  Surgeon: Annice Needyew, Jason S, MD;  Location: [facility] ORS;  Service: Vascular;  Laterality: Right;    Social History Social History   Tobacco Use  . Smoking status: Light Tobacco Smoker    Packs/day: 0.25    Years: 58.00    Pack years: 14.50    Types: Cigarettes  . Smokeless tobacco: Former NeurosurgeonUser  . Tobacco comment: 1 cigarette per day  Substance Use Topics  . Alcohol use: No    Alcohol/week: 0.0 oz  . Drug use: No    Family History Family History  Problem Relation Age of Onset  . Diabetes Son   . Cancer Mother   . Colon cancer Mother   . Lung cancer Mother   . Tremor Father   . Heart disease Father   . Tremor Brother   No family history of bleeding/clotting disorders, porphyria or autoimmune disease   No Known Allergies   REVIEW OF SYSTEMS (Negative unless checked)  Constitutional: [] Weight loss  [] Fever  [] Chills Cardiac: [] Chest pain   [] Chest pressure   [] Palpitations   [] Shortness of breath when laying flat   [] Shortness of breath at rest   [] Shortness of breath with exertion. Vascular:  [] Pain in legs with walking   [x] Pain in legs at rest   [] Pain in legs when laying flat   [] Claudication   [] Pain in feet when walking  [] Pain in feet at rest  [] Pain in feet when laying flat   [] History of DVT   [] Phlebitis    [x] Swelling in legs   [x] Varicose veins   [x] Non-healing ulcers Pulmonary:   [] Uses home oxygen   [x] Productive cough   [] Hemoptysis   [] Wheeze  [x] COPD   [] Asthma Neurologic:  [] Dizziness  [] Blackouts   [] Seizures   [] History of stroke   [] History of TIA  [] Aphasia   [] Temporary blindness   [] Dysphagia   [] Weakness or numbness in arms   [] Weakness or numbness in legs Musculoskeletal:  [x] Arthritis   [] Joint swelling   [  x]Joint pain   [] Low back pain Hematologic:  [] Easy bruising  [] Easy bleeding   [] Hypercoagulable state   [] Anemic  [] Hepatitis Gastrointestinal:  [] Blood in stool   [] Vomiting blood  [] Gastroesophageal reflux/heartburn   [] Difficulty swallowing. Genitourinary:  [] Chronic kidney disease   [] Difficult urination  [] Frequent urination  [] Burning with urination   [] Blood in urine Skin:  [x] Rashes   [x] Ulcers   [] Wounds Psychological:  [] History of anxiety   []  History of major depression.  Physical Examination  Vitals:   12/02/17 1952 12/03/17 0353 12/03/17 0500 12/03/17 0509  BP: (!) 122/55 118/69    Pulse: 78 88    Resp:  (!) 22    Temp:  (!) 102.7 F (39.3 C)  99.8 F (37.7 C)  TempSrc:  Oral  Oral  SpO2:  94%    Weight:   (!) 310 lb 14.4 oz (141 kg)   Height:       Body mass index is 45.91 kg/m.  Head: [place]/AT, No temporalis wasting. Prominent temp pulse not noted. Ear/Nose/Throat: Nares w/o erythema or drainage, oropharynx w/o obsrtuction, Mallampati score: 4.  Dentition poor.  Eyes: PERRLA, Sclera nonicteric.  Neck: Supple, no nuchal rigidity.  No bruit or JVD.  Pulmonary:  Breath sounds equal bilaterally, no use of accessory muscles.  Cardiac: RRR, normal S1, S2, no Murmurs, rubs or gallops. Vascular: 3+ edema of the right and left legs with severe venous changes.  Venous ulcer noted in the lateral calf area on the right Gastrointestinal: soft, non-tender, non-distended.  Musculoskeletal: Moves all extremities.  No deformity or atrophy. No edema. Neurologic: CN  2-12 intact. Symmetrical.  Speech is fluent.  Psychiatric: Judgment intact, Mood & affect appropriate for pt's clinical situation. Dermatologic: Venous stasis dermatitis with ulcers present on the right .  No cellulitis or open wounds. Lymph : No Cervical,  or Inguinal lymphadenopathy.      CBC Lab Results  Component Value Date   WBC 11.4 (H) 12/02/2017   HGB 11.7 (L) 12/02/2017   HCT 35.0 (L) 12/02/2017   MCV 94.4 12/02/2017   PLT 122 (L) 12/02/2017    BMET    Component Value Date/Time   NA 139 12/03/2017 0756   NA 142 12/02/2012 0512   K 2.8 (L) 12/03/2017 0756   K 3.1 (L) 12/02/2012 0512   CL 106 12/03/2017 0756   CL 105 12/02/2012 0512   CO2 25 12/03/2017 0756   CO2 29 12/02/2012 0512   GLUCOSE 116 (H) 12/03/2017 0756   GLUCOSE 97 12/02/2012 0512   BUN 49 (H) 12/03/2017 0756   BUN 24 (H) 12/02/2012 0512   CREATININE 1.63 (H) 12/03/2017 0756   CREATININE 1.20 12/02/2012 0512   CALCIUM 7.7 (L) 12/03/2017 0756   CALCIUM 8.2 (L) 12/02/2012 0512   GFRNONAA 38 (L) 12/03/2017 0756   GFRNONAA 59 (L) 12/02/2012 0512   GFRAA 44 (L) 12/03/2017 0756   GFRAA >60 12/02/2012 0512   Estimated Creatinine Clearance: 50.5 mL/min (A) (by C-G formula based on SCr of 1.63 mg/dL (H)).  COAG Lab Results  Component Value Date   INR 1.08 11/11/2017   INR 1.09 06/12/2010   INR 1.14 06/07/2010     Assessment/Plan 1.  Venous ulceration secondary to trauma and hematoma formation: At the present time the patient appears to be improved his wound is certainly smaller than it was in March and the [facility] has been working well.  I would continue VAC therapy and change the dressing Monday Wednesday and Fridays.  He is on appropriate antibiotics for dealing with his cellulitis.  I have stressed to the patient elevation to control his edema.  No indication for re-debridement at this time we will continue to monitor.  2.  Sepsis   Secondary to pneumonia and cellulitis   IV fluids, cultures are  pending, IV antibiotics.  3.  Hypertension   Due to hypotension, hold all antihypertensive medications for now, keep on IV fluids for now.  4.  Hyperlipidemia   Continue atorvastatin.  5.  COPD   Continue nebulizer treatment.  Inspiratory spirometry will also be added  6.  Acute renal failure on CK D stage III   Continue monitoring with IV fluids.     Levora Dredge, MD  12/03/2017 10:07 AM

## 2017-12-03 NOTE — Evaluation (Signed)
Physical Therapy Evaluation Patient Details Name: Dylan ClintonJerry B Savannah MRN: [id number] DOB: [birth year] Today's Date: 12/03/2017   History of Present Illness  Pt is an 82 year old male admitted for community acquired pneumonia and cellulitis of R LE.  Pt also reports having a fall in the past week which has caused L hip pain.  PMH includes tremor, L shoulder pain, arthritis, MI, OA, Htn, HLD, R foot drop, COPD, CHF and cervicalgia.  Clinical Impression  Pt is an 82 year old male who lives in a one story home with his wife.  He reports being dependent on an aide and using a RW and WC for mobility prior to admission.  Pt was in bed upon pt arrival and reported 0/10 pain.  He had a wound vac on R LL and reports having bilateral neuropathy in feet and R foot drop.  Pt is very fearful of falling and hesitant to attempt mobility.  He required max A to attempt supine to sit at EOB, assisting minimally with bringing LE's over EOB.  Pt reported increased pain in L hip with movement and reported that his L shoulder is also in too much pain for him to assist with bed mobility.  Pt requested assistance to get back in to bed and PT and RN provided total A to scoot pt up in bed with bed in trendelenburg position.  PT provided education concerning SNF setting and importance of frequent participation in PT prior to and following pending L TKA.  Pt expressed understanding.  Pt will continue to benefit from skilled PT with focus on strength, safe use of AD, tolerance to activity, ROM, wound care and pain management.    Follow Up Recommendations SNF    Equipment Recommendations       Recommendations for Other Services       Precautions / Restrictions Precautions Precautions: Fall Precaution Comments: High fall risk Restrictions Weight Bearing Restrictions: No      Mobility  Bed Mobility Overal bed mobility: Needs Assistance Bed Mobility: Supine to Sit;Sit to Supine     Supine to sit: Max assist Sit to supine:  Max assist   General bed mobility comments: Pt assisted with minimal hip abduction/adduction to bring LE's over EOB but is unable to sit upright or scoot up in bed.  Transfers Overall transfer level: (Did not perform.  Unable to assess.  Pt is experiencing hip pain but is also very fearful of falling.)                  Ambulation/Gait                Stairs            Wheelchair Mobility    Modified Rankin (Stroke Patients Only)       Balance Overall balance assessment: Needs assistance;History of Falls Sitting-balance support: Bilateral upper extremity supported;Feet supported   Sitting balance - Comments: Requires assistance to sit at EOB.                                     Pertinent Vitals/Pain Pain Assessment: Faces Faces Pain Scale: Hurts little more Pain Location: L hip with bed mobility. Pain Intervention(s): Limited activity within patient's tolerance;Monitored during session;Patient requesting pain meds-RN notified    Home Living Family/patient expects to be discharged to:: Skilled nursing facility Living Arrangements: Spouse/significant other Available Help at Discharge: Personal care attendant;Available  PRN/intermittently Type of Home: House Home Access: Ramped entrance     Home Layout: One level Home Equipment: Walker - 2 wheels;Wheelchair - manual      Prior Function Level of Independence: Needs assistance   Gait / Transfers Assistance Needed: Uses RW for short distances and WC for longer distances.  ADL's / Homemaking Assistance Needed: Receives assistance with bathing and meals.        Hand Dominance        Extremity/Trunk Assessment   Upper Extremity Assessment Upper Extremity Assessment: Generalized weakness(L shoulder flexion/abduction ROM limited by pain.)    Lower Extremity Assessment Lower Extremity Assessment: Generalized weakness(Unable to perform SLR or hip abduction through full range against  gravity.  Neuropathy in bilateral feet.  )    Cervical / Trunk Assessment Cervical / Trunk Assessment: Normal  Communication   Communication: No difficulties  Cognition Arousal/Alertness: Awake/alert Behavior During Therapy: WFL for tasks assessed/performed Overall Cognitive Status: Within Functional Limits for tasks assessed                                        General Comments      Exercises     Assessment/Plan    PT Assessment Patient needs continued PT services  PT Problem List Decreased strength;Decreased mobility;Decreased range of motion;Decreased activity tolerance;Decreased balance;Decreased knowledge of use of DME;Impaired sensation;Cardiopulmonary status limiting activity       PT Treatment Interventions DME instruction;Therapeutic activities;Gait training;Therapeutic exercise;Patient/family education;Stair training;Balance training;Functional mobility training;Neuromuscular re-education;Wheelchair mobility training    PT Goals (Current goals can be found in the Care Plan section)  Acute Rehab PT Goals Patient Stated Goal: To have a L knee replacement and be able to walk again. PT Goal Formulation: With patient Time For Goal Achievement: 12/17/17 Potential to Achieve Goals: Good    Frequency Min 2X/week   Barriers to discharge        Co-evaluation               AM-PAC PT "6 Clicks" Daily Activity  Outcome Measure Difficulty turning over in bed (including adjusting bedclothes, sheets and blankets)?: A Lot Difficulty moving from lying on back to sitting on the side of the bed? : A Lot Difficulty sitting down on and standing up from a chair with arms (e.g., wheelchair, bedside commode, etc,.)?: A Lot Help needed moving to and from a bed to chair (including a wheelchair)?: Total Help needed walking in hospital room?: Total Help needed climbing 3-5 steps with a railing? : Total 6 Click Score: 9    End of Session   Activity  Tolerance: Patient limited by pain;Patient limited by fatigue Patient left: in bed;with call bell/phone within reach Nurse Communication: Mobility status PT Visit Diagnosis: Muscle weakness (generalized) (M62.81);History of falling (Z91.81)    Time: 1610-9604 PT Time Calculation (min) (ACUTE ONLY): 25 min   Charges:   PT Evaluation $PT Eval Moderate Complexity: 1 Mod     PT G Codes:   PT G-Codes **NOT FOR INPATIENT CLASS** Functional Assessment Tool Used: AM-PAC 6 Clicks Basic Mobility    Glenetta Hew, PT, DPT   Glenetta Hew 12/03/2017, 9:12 AM

## 2017-12-03 NOTE — NC FL2 (Signed)
[place] MEDICAID FL2 LEVEL OF CARE SCREENING TOOL     IDENTIFICATION  Patient Name: Dylan [name] Birthdate: August 20, 1936 Sex: male Admission Date (Current Location): 12/01/2017  [facility] and [place] Number:  Chiropodist and Address:  [facility], [address], [place], [place] [address]      Provider Number: [id number]  Attending Physician Name and Address:  Alford Highland, MD  Relative Name and Phone Number:       Current Level of Care: Hospital Recommended Level of Care: Skilled Nursing Facility Prior Approval Number:    Date Approved/Denied:   PASRR Number:    Discharge Plan: SNF    Current Diagnoses: Patient Active Problem List   Diagnosis Date Noted  . Pressure injury of skin 12/02/2017  . Community acquired pneumonia 12/01/2017  . Pneumonia 12/01/2017  . Hypertension 09/23/2017  . Hyperlipidemia 09/23/2017  . Hematoma of leg, right, subsequent encounter 09/22/2017  . Blood blister 09/22/2017  . Weakness 08/31/2017  . Varicose veins of lower extremities with ulcer (HCC) 06/17/2016  . Chronic venous insufficiency 06/17/2016  . Lymphedema 06/17/2016  . Acute on chronic diastolic CHF (congestive heart failure) (HCC) 06/10/2016  . CHF (congestive heart failure) (HCC) 12/15/2015    Orientation RESPIRATION BLADDER Height & Weight     Self, Time, Situation, Place  Normal Continent Weight: (!) 310 lb 14.4 oz (141 kg) Height:  5\' 9"  (175.3 cm)  BEHAVIORAL SYMPTOMS/MOOD NEUROLOGICAL BOWEL NUTRITION STATUS  (none) (none) Continent Diet(heart healthy,carb modified)  AMBULATORY STATUS COMMUNICATION OF NEEDS Skin   Extensive Assist Verbally (wound vac therapy)                       Personal Care Assistance Level of Assistance  Bathing, Dressing Bathing Assistance: Maximum assistance   Dressing Assistance: Maximum assistance     Functional Limitations Info  Sight Sight Info: Impaired        SPECIAL CARE FACTORS  FREQUENCY  PT (By licensed PT)(patient will need a wound vac after discharge)                    Contractures Contractures Info: Not present    Additional Factors Info                  Current Medications (12/03/2017):  This is the current hospital active medication list Current Facility-Administered Medications  Medication Dose Route Frequency Provider Last Rate Last Dose  . acetaminophen (TYLENOL) tablet 650 mg  650 mg Oral Q6H PRN Altamese Dilling, MD   650 mg at 12/03/17 0355  . aspirin EC tablet 81 mg  81 mg Oral Daily Altamese Dilling, MD   81 mg at 12/03/17 0959  . atorvastatin (LIPITOR) tablet 40 mg  40 mg Oral QPM Altamese Dilling, MD   40 mg at 12/02/17 1707  . azithromycin (ZITHROMAX) tablet 250 mg  250 mg Oral Daily Alford Highland, MD   250 mg at 12/03/17 0959  . cefTRIAXone (ROCEPHIN) 1 g in sodium chloride 0.9 % 100 mL IVPB  1 g Intravenous Daily Alford Highland, MD   Stopped at 12/03/17 1035  . diphenhydrAMINE (BENADRYL) capsule 50 mg  50 mg Oral Q8H PRN Cammy Copa, MD   50 mg at 12/03/17 1000  . docusate sodium (COLACE) capsule 100 mg  100 mg Oral BID PRN Altamese Dilling, MD      . docusate sodium (COLACE) capsule 200 mg  200 mg Oral QHS Altamese Dilling, MD  200 mg at 12/02/17 2110  . fluticasone (FLONASE) 50 MCG/ACT nasal spray 1 spray  1 spray Each Nare Daily PRN Altamese DillingVachhani, Vaibhavkumar, MD      . gabapentin (NEURONTIN) capsule 400 mg  400 mg Oral QHS Altamese DillingVachhani, Vaibhavkumar, MD   400 mg at 12/02/17 2110  . heparin injection 5,000 Units  5,000 Units Subcutaneous Q8H Altamese DillingVachhani, Vaibhavkumar, MD   5,000 Units at 12/03/17 0507  . HYDROcodone-acetaminophen (NORCO/VICODIN) 5-325 MG per tablet 1 tablet  1 tablet Oral Q6H PRN Alford HighlandWieting, Richard, MD   1 tablet at 12/03/17 0959  . ipratropium-albuterol (DUONEB) 0.5-2.5 (3) MG/3ML nebulizer solution 3 mL  3 mL Nebulization Q6H PRN Altamese DillingVachhani, Vaibhavkumar, MD      . lactose free nutrition  (Boost) liquid 237 mL  237 mL Oral Daily Altamese DillingVachhani, Vaibhavkumar, MD   237 mL at 12/03/17 1010  . naphazoline-pheniramine (NAPHCON-A) 0.025-0.3 % ophthalmic solution 1 drop  1 drop Both Eyes QID PRN Alford HighlandWieting, Richard, MD   1 drop at 12/03/17 1000  . umeclidinium-vilanterol (ANORO ELLIPTA) 62.5-25 MCG/INH 1 puff  1 puff Inhalation Daily Altamese DillingVachhani, Vaibhavkumar, MD   1 puff at 12/03/17 [id number]     Discharge Medications: Please see discharge summary for a list of discharge medications.  Relevant Imaging Results:  Relevant Lab Results:   Additional Information ss: [id number]  York SpanielMonica Rossi Silvestro, LCSW

## 2017-12-04 LAB — BASIC METABOLIC PANEL
ANION GAP: 9 (ref 5–15)
BUN: 47 mg/dL — ABNORMAL HIGH (ref 6–20)
CO2: 26 mmol/L (ref 22–32)
Calcium: 7.9 mg/dL — ABNORMAL LOW (ref 8.9–10.3)
Chloride: 104 mmol/L (ref 101–111)
Creatinine, Ser: 1.66 mg/dL — ABNORMAL HIGH (ref 0.61–1.24)
GFR calc non Af Amer: 37 mL/min — ABNORMAL LOW (ref >60)
GFR, EST AFRICAN AMERICAN: 43 mL/min — AB (ref >60)
GLUCOSE: 101 mg/dL — AB (ref 65–99)
POTASSIUM: 3.3 mmol/L — AB (ref 3.5–5.1)
Sodium: 139 mmol/L (ref 135–145)

## 2017-12-04 LAB — MAGNESIUM: Magnesium: 2 mg/dL (ref 1.7–2.4)

## 2017-12-04 MED ORDER — CARVEDILOL 6.25 MG PO TABS
3.1250 mg | ORAL_TABLET | Freq: Two times a day (BID) | ORAL | Status: DC
Start: 2017-12-04 — End: 2017-12-05
  Administered 2017-12-04 – 2017-12-05 (×2): 3.125 mg via ORAL
  Filled 2017-12-04 (×2): qty 1

## 2017-12-04 MED ORDER — LACTULOSE 10 GM/15ML PO SOLN
30.0000 g | Freq: Once | ORAL | Status: DC
  Filled 2017-12-04: qty 60

## 2017-12-04 MED ORDER — TORSEMIDE 20 MG PO TABS
20.0000 mg | ORAL_TABLET | Freq: Two times a day (BID) | ORAL | Status: DC
  Administered 2017-12-04 – 2017-12-05 (×2): 20 mg via ORAL
  Filled 2017-12-04 (×3): qty 1

## 2017-12-04 MED ORDER — POTASSIUM CHLORIDE CRYS ER 20 MEQ PO TBCR
20.0000 meq | EXTENDED_RELEASE_TABLET | Freq: Two times a day (BID) | ORAL | Status: DC
  Administered 2017-12-04 – 2017-12-05 (×3): 20 meq via ORAL
  Filled 2017-12-04 (×2): qty 1

## 2017-12-04 NOTE — Clinical Social Work Note (Signed)
[place] [facility] [place] [facility]. They now have a bed for patient. CSW has [facility]. CSW left a message for patient's wife. York SpanielMonica Lorra Freeman MSW,LCSW [phone]

## 2017-12-04 NOTE — Progress Notes (Signed)
Patient ID: Dylan [name], male   DOB: 10/28/1935, 82 y.o.   MRN: [id number]  [facility] PROGRESS NOTE  Dylan [name] [id number] DOB: 10/28/1935 DOA: 12/01/2017 PCP: Jaclyn Shaggyate, [name], MD  HPI/Subjective: Patient sweating at night.  Had low-grade temperature last night.  Slight cough.  Still very weak.  Objective: Vitals:   12/04/17 0446 12/04/17 1343  BP: 134/83 132/67  Pulse: 75 80  Resp: 20   Temp: 98.5 F (36.9 C) 98.6 F (37 C)  SpO2: 99% 98%    Filed Weights   12/01/17 1930 12/03/17 0500 12/04/17 0500  Weight: 135.7 kg (299 lb 2.6 oz) (!) 141 kg (310 lb 14.4 oz) (!) 143.6 kg (316 lb 8 oz)    ROS: Review of Systems  Constitutional: Negative for chills and fever.  Eyes: Negative for blurred vision.  Respiratory: Positive for cough. Negative for shortness of breath.   Cardiovascular: Negative for chest pain.  Gastrointestinal: Negative for abdominal pain, constipation, diarrhea, nausea and vomiting.  Genitourinary: Negative for dysuria.  Musculoskeletal: Negative for joint pain.  Neurological: Negative for dizziness and headaches.   Exam: Physical Exam  Constitutional: He is oriented to person, place, and time.  HENT:  Nose: No mucosal edema.  Mouth/Throat: No oropharyngeal exudate or posterior oropharyngeal edema.  Eyes: Pupils are equal, round, and reactive to light. Conjunctivae, EOM and lids are normal.  Some crusting around the left lid  Neck: No JVD present. Carotid bruit is not present. No edema present. No thyroid mass and no thyromegaly present.  Cardiovascular: S1 normal and S2 normal. Exam reveals no gallop.  No murmur heard. Pulses:      Dorsalis pedis pulses are 2+ on the right side, and 2+ on the left side.  Respiratory: No respiratory distress. He has decreased breath sounds in the right lower field and the left lower field. He has no wheezes. He has no rhonchi. He has no rales.  GI: Soft. Bowel sounds are normal. There is no tenderness.   Musculoskeletal:       Right shoulder: He exhibits no swelling.       Right ankle: He exhibits swelling.       Left ankle: He exhibits swelling.  Lymphadenopathy:    He has no cervical adenopathy.  Neurological: He is alert and oriented to person, place, and time. No cranial nerve deficit.  Skin: Skin is warm. No rash noted. Nails show no clubbing.  Wound VAC around right leg.  Chronic lower extremity discoloration  Psychiatric: He has a normal mood and affect.      Data Reviewed: Basic Metabolic Panel: Recent Labs  Lab 12/01/17 1253 12/02/17 0512 12/03/17 0756 12/04/17 0613  NA 138 141 139 139  K 3.0* 3.0* 2.8* 3.3*  CL 100* 104 106 104  CO2 28 28 25 26   GLUCOSE 160* 106* 116* 101*  BUN 53* 50* 49* 47*  CREATININE 1.80* 1.59* 1.63* 1.66*  CALCIUM 8.3* 8.0* 7.7* 7.9*  MG  --   --   --  2.0   Liver Function Tests: Recent Labs  Lab 12/01/17 1253  AST 45*  ALT 44  ALKPHOS 93  BILITOT 0.7  PROT 6.5  ALBUMIN 3.2*   CBC: Recent Labs  Lab 12/01/17 1253 12/02/17 0512  WBC 16.3* 11.4*  NEUTROABS 13.6*  --   HGB 12.0* 11.7*  HCT 36.1* 35.0*  MCV 93.9 94.4  PLT 129* 122*   Cardiac Enzymes: Recent Labs  Lab 12/01/17 1253  TROPONINI 0.12*  Recent Results (from the past 240 hour(s))  Blood Culture (routine x 2)     Status: None (Preliminary result)   Collection Time: 12/01/17 12:53 PM  Result Value Ref Range Status   Specimen Description BLOOD RIGHT ARM  Final   Special Requests   Final    BOTTLES DRAWN AEROBIC AND ANAEROBIC Blood Culture results may not be optimal due to an excessive volume of blood received in culture bottles   Culture   Final    NO GROWTH 3 DAYS Performed at [facility], [address] [place], [place] [address]    Report Status PENDING  Incomplete  Blood Culture (routine x 2)     Status: None (Preliminary result)   Collection Time: 12/01/17  3:55 PM  Result Value Ref Range Status   Specimen Description BLOOD BLOOD  LEFT ARM  Final   Special Requests   Final    BOTTLES DRAWN AEROBIC AND ANAEROBIC Blood Culture adequate volume   Culture   Final    NO GROWTH 3 DAYS Performed at [facility], [address], [place], [place] [address]    Report Status PENDING  Incomplete  MRSA PCR Screening     Status: None   Collection Time: 12/02/17  5:58 AM  Result Value Ref Range Status   MRSA by PCR NEGATIVE NEGATIVE Final    Comment:        The GeneXpert MRSA Assay (FDA approved for NASAL specimens only), is one component of a comprehensive MRSA colonization surveillance program. It is not intended to diagnose MRSA infection nor to guide or monitor treatment for MRSA infections. Performed at [facility], [address] [place], [place] [address]       Scheduled Meds: . aspirin EC  81 mg Oral Daily  . atorvastatin  40 mg Oral QPM  . azithromycin  250 mg Oral Daily  . docusate sodium  200 mg Oral QHS  . gabapentin  400 mg Oral QHS  . heparin  5,000 Units Subcutaneous Q8H  . lactose free nutrition  237 mL Oral Daily  . lactulose  30 g Oral Once  . potassium chloride  20 mEq Oral BID  . potassium chloride  40 mEq Oral Once  . spironolactone  12.5 mg Oral Daily  . torsemide  20 mg Oral BID  . umeclidinium-vilanterol  1 puff Inhalation Daily   Continuous Infusions: . cefTRIAXone (ROCEPHIN)  IV Stopped (12/04/17 9147)    Assessment/Plan:  1. Clinical sepsis with pneumonia.   continue antibiotics to Rocephin and Zithromax.  Blood cultures so far negative.  Would like to see afebrile for 24 hours prior to disposition. 2. Chronic ulcer wound right lower extremity.  Patient has a wound VAC.   appreciate vascular consultation 3. Acute kidney injury on chronic kidney disease stage III.  Creatinine baseline around 1.45.  Creatinine today 1.66 4. essential hypertension and weight gain.  Restart torsemide and Aldactone. 5. Hypokalemia.  Oral potassium replacement 6. Weakness and  falls.  Physical therapy evaluation. 7. Hyperlipidemia unspecified on atorvastatin 8. Neuropathy on gabapentin 9. Morbid obesity.  Weight loss needed  Code Status:     Code Status Orders  (From admission, onward)        Start     Ordered   12/01/17 1924  Full code  Continuous     12/01/17 1923    Code Status History    Date Active Date Inactive Code Status Order ID Comments User Context  08/31/2017 0557 09/05/2017 1959 Full Code [id number]  Arnaldo Natal, MD Inpatient   06/10/2016 1934 06/13/2016 1816 Full Code [id number]  Shaune Pollack, MD Inpatient   12/15/2015 0134 12/16/2015 1507 Full Code [id number]  Ihor Austin, MD ED    Advance Directive Documentation     Most Recent Value  Type of Advance Directive  Healthcare Power of Attorney, Living will  Pre-existing out of facility DNR order (yellow form or pink MOST form)  -  "MOST" Form in Place?  -     Disposition Plan: [facility] Saturday depending on temperature curve  Consultants:  Vascular surgery  Antibiotics:  Rocephin  Zithromax  Time spent: 25 minutes  [employer]

## 2017-12-04 NOTE — Progress Notes (Signed)
Pt wife requesting for pt to be seen by PT ASAP or first thing in the AM.  Family is upset that PT is not working daily with patient.

## 2017-12-04 NOTE — Care Management Important Message (Signed)
Important Message  Patient Details  Name: Dylan [name] MRN: [id number] Date of Birth: Jan 18, 1936   Medicare Important Message Given:  Yes    Chapman FitchBOWEN, Amelio Brosky T, RN 12/04/2017, 2:21 PM

## 2017-12-05 LAB — CBC
HEMATOCRIT: 35.4 % — AB (ref 40.0–52.0)
HEMOGLOBIN: 11.8 g/dL — AB (ref 13.0–18.0)
MCH: 31.4 pg (ref 26.0–34.0)
MCHC: 33.3 g/dL (ref 32.0–36.0)
MCV: 94.4 fL (ref 80.0–100.0)
Platelets: 169 10*3/uL (ref 150–440)
RBC: 3.75 MIL/uL — ABNORMAL LOW (ref 4.40–5.90)
RDW: 16.2 % — ABNORMAL HIGH (ref 11.5–14.5)
WBC: 8.6 10*3/uL (ref 3.8–10.6)

## 2017-12-05 LAB — BASIC METABOLIC PANEL
ANION GAP: 9 (ref 5–15)
BUN: 42 mg/dL — ABNORMAL HIGH (ref 6–20)
CALCIUM: 8 mg/dL — AB (ref 8.9–10.3)
CHLORIDE: 103 mmol/L (ref 101–111)
CO2: 27 mmol/L (ref 22–32)
CREATININE: 1.45 mg/dL — AB (ref 0.61–1.24)
GFR calc non Af Amer: 44 mL/min — ABNORMAL LOW (ref >60)
GFR, EST AFRICAN AMERICAN: 51 mL/min — AB (ref >60)
Glucose, Bld: 110 mg/dL — ABNORMAL HIGH (ref 65–99)
Potassium: 3.6 mmol/L (ref 3.5–5.1)
Sodium: 139 mmol/L (ref 135–145)

## 2017-12-05 MED ORDER — LEVOFLOXACIN 750 MG PO TABS: 750.0000 mg | ORAL_TABLET | ORAL | Status: DC

## 2017-12-05 MED ORDER — CARVEDILOL 3.125 MG PO TABS: 3.1250 mg | ORAL_TABLET | Freq: Two times a day (BID) | ORAL | 0 refills | Status: DC

## 2017-12-05 MED ORDER — LEVOFLOXACIN 750 MG PO TABS: 750.0000 mg | ORAL_TABLET | Freq: Every day | ORAL | 0 refills | Status: DC

## 2017-12-05 MED ORDER — HYDROCODONE-ACETAMINOPHEN 5-325 MG PO TABS: 1.0000 | ORAL_TABLET | Freq: Four times a day (QID) | ORAL | 0 refills | Status: DC | PRN

## 2017-12-05 MED ORDER — LEVOFLOXACIN 750 MG PO TABS
750.0000 mg | ORAL_TABLET | Freq: Every day | ORAL | Status: DC
  Administered 2017-12-05: 750 mg via ORAL
  Filled 2017-12-05: qty 1

## 2017-12-05 MED ORDER — NAPHAZOLINE-PHENIRAMINE 0.025-0.3 % OP SOLN: 1.0000 [drp] | Freq: Four times a day (QID) | OPHTHALMIC | 0 refills | Status: DC | PRN

## 2017-12-05 MED ORDER — ACETAMINOPHEN 325 MG PO TABS: 650.0000 mg | ORAL_TABLET | Freq: Four times a day (QID) | ORAL | Status: DC | PRN

## 2017-12-05 MED ORDER — DOXYCYCLINE HYCLATE 100 MG PO TABS: 100.0000 mg | ORAL_TABLET | Freq: Two times a day (BID) | ORAL | 0 refills | Status: DC

## 2017-12-05 MED ORDER — DOXYCYCLINE HYCLATE 100 MG PO TABS
100.0000 mg | ORAL_TABLET | Freq: Two times a day (BID) | ORAL | Status: DC
  Administered 2017-12-05: 100 mg via ORAL
  Filled 2017-12-05: qty 1

## 2017-12-05 NOTE — Clinical Social Work Note (Signed)
Patient discharging to [facility] today. Discharge information sent. Nurse to call report. Patient to transport via EMS however currently patient is 3rd on EMS' list. York SpanielMonica Anairis Knick MSW,LCSW [phone]

## 2017-12-05 NOTE — Clinical Social Work Placement (Signed)
   CLINICAL SOCIAL WORK PLACEMENT  NOTE  Date:  12/05/2017  Patient Details  Name: Dylan [name] MRN: [id number] Date of Birth: 09/13/1935  Clinical Social Work is seeking post-discharge placement for this patient at the Skilled  Nursing Facility level of care (*CSW will initial, date and re-position this form in  chart as items are completed):  Yes   Patient/family provided with [facility] Clinical Social Work Department's list of facilities offering this level of care within the geographic area requested by the patient (or if unable, by the patient's family).  Yes   Patient/family informed of their freedom to choose among providers that offer the needed level of care, that participate in Medicare, Medicaid or managed care program needed by the patient, have an available bed and are willing to accept the patient.  Yes   Patient/family informed of [facility]'s ownership interest in [facility] and [facility], as well as of the fact that they are under no obligation to receive care at these facilities.  PASRR submitted to EDS on       PASRR number received on       Existing PASRR number confirmed on 12/03/17     FL2 transmitted to all facilities in geographic area requested by pt/family on 12/03/17     FL2 transmitted to all facilities within larger geographic area on       Patient informed that his/her managed care company has contracts with or will negotiate with certain facilities, including the following:        Yes   Patient/family informed of bed offers received.  [facility] ([facility])     Physician recommends and patient chooses bed at (snf)    Patient to be transferred to ([facility]) on 12/05/17.  Patient to be transferred to facility by (EMS)     Patient family notified on 12/05/17 of transfer.  Name of family member notified:  Dylan [name]([name]: wife)     PHYSICIAN       Additional Comment:    _______________________________________________ York SpanielMonica Tamela Elsayed,  LCSW 12/05/2017, 3:11 PM

## 2017-12-05 NOTE — Discharge Summary (Addendum)
[facility] [place] [facility]   PATIENT NAME: Dylan [name]    MR#:  [id number]  DATE OF BIRTH:  November 10, 1935  DATE OF ADMISSION:  12/01/2017 ADMITTING PHYSICIAN: Altamese DillingVaibhavkumar Vachhani, MD  DATE OF DISCHARGE: 12/05/2017  PRIMARY CARE PHYSICIAN: Jaclyn Shaggyate, [name], MD    ADMISSION DIAGNOSIS:  Healthcare-associated pneumonia [J18.9] Cellulitis of right lower extremity [L03.115]  DISCHARGE DIAGNOSIS:  Principal Problem:   Community acquired pneumonia Active Problems:   Pneumonia   Pressure injury of skin   SECONDARY DIAGNOSIS:   Past Medical History:  Diagnosis Date  . CAD (coronary artery disease)   . Cervicalgia   . CHF (congestive heart failure) (HCC)   . COPD (chronic obstructive pulmonary disease) (HCC)   . Diastolic heart failure (HCC)   . Foot drop, right   . History of kidney stones   . Hyperlipidemia    unspecified  . Hypertension   . Myocardial infarction (HCC)   . Osteoarthritis   . Shoulder pain, left   . Sleep apnea   . Tremor, essential     HOSPITAL COURSE:   1.  Clinical sepsis with pneumonia and possible right lower extremity cellulitis.  The patient was initially on Unasyn and I switched antibiotics to Rocephin and Zithromax.  The patient was having low-grade temperatures around 10 PM every night.  Patient stated he wanted to get out of the hospital today to go to rehab.  I figured I did use a little broader coverage to cover the leg a little bit better.  I switched antibiotics to Levaquin high-dose for 7 days total and doxycycline.  Patient will finish up a course. 2.  Chronic ulcer of the right lower extremity.  Patient has a wound VAC.  Appreciate vascular consultation.  Continue wound VAC. 3.  Acute kidney injury on chronic kidney disease stage III.  Creatinine back to baseline.  Continue Lasix.  The patient was given IV fluids during the hospital course. 4.  Essential hypertension weight gain.  I restarted torsemide and Aldactone.  I  restarted low-dose Coreg.  Can go back on his low-dose lisinopril. 5.  Hypokalemia.  Replace potassium orally.  Recheck BMP on a weekly basis at facility. 6.  Weakness and falls.  Patient has a bed over at rehab at [facility] and wants to go today. 7.  Morbid obesity.  Weight loss needed 8.  Neuropathy on gabapentin 9.  Hyperlipidemia unspecified on atorvastatin 10.  History of diastolic congestive heart failure.  No signs of heart failure at this point.  I actually gave him IV fluids during the hospital course.  Continue usual medications of torsemide, Aldactone low-dose Coreg and low-dose lisinopril.  Daily weights 11.  Eye allergy and watering.  Patient was prescribed eyedrops.  DISCHARGE CONDITIONS:   Satisfactory  CONSULTS OBTAINED:  Treatment Team:  Renford DillsSchnier, Gregory G, MD  DRUG ALLERGIES:  No Known Allergies  DISCHARGE MEDICATIONS:   Allergies as of 12/05/2017   No Known Allergies     Medication List    STOP taking these medications   cephALEXin 500 MG capsule Commonly known as:  KEFLEX   hydrochlorothiazide 25 MG tablet Commonly known as:  HYDRODIURIL   traMADol 50 MG tablet Commonly known as:  ULTRAM     TAKE these medications   acetaminophen 325 MG tablet Commonly known as:  TYLENOL Take 2 tablets (650 mg total) by mouth every 6 (six) hours as needed for mild pain or fever.   ALLERGY PO Take 1  tablet by mouth daily as needed (allergies).   aspirin EC 81 MG tablet Take 81 mg by mouth daily.   atorvastatin 40 MG tablet Commonly known as:  LIPITOR Take 40 mg by mouth every evening.   carvedilol 3.125 MG tablet Commonly known as:  COREG Take 1 tablet (3.125 mg total) by mouth 2 (two) times daily with a meal. Hold for HR less than 55 What changed:    medication strength  how much to take  when to take this  additional instructions   CENTROVITE Tabs Take 1 tablet by mouth daily.   DERMACLOUD Crea Apply liberal amount topically to area of  skin irritation as needed. Ok to leave at bedside.   docusate sodium 100 MG capsule Commonly known as:  COLACE Take 200 mg by mouth at bedtime.   doxycycline 100 MG tablet Commonly known as:  VIBRA-TABS Take 1 tablet (100 mg total) by mouth every 12 (twelve) hours.   ENDIT EX Apply topically to sacrum/buttocks 2 times daily and as needed for irritation   fluticasone 50 MCG/ACT nasal spray Commonly known as:  FLONASE Place 1 spray into both nostrils daily.   gabapentin 400 MG capsule Commonly known as:  NEURONTIN Take 400 mg by mouth at bedtime.   HYDROcodone-acetaminophen 5-325 MG tablet Commonly known as:  NORCO/VICODIN Take 1 tablet by mouth every 6 (six) hours as needed for moderate pain or severe pain.   ipratropium-albuterol 0.5-2.5 (3) MG/3ML Soln Commonly known as:  DUONEB Take 3 mLs by nebulization every 6 (six) hours as needed (shortness of breath).   isosorbide mononitrate 30 MG 24 hr tablet Commonly known as:  IMDUR Take 30 mg by mouth daily.   lactose free nutrition Liqd Take 237 mLs by mouth daily.   levofloxacin 750 MG tablet Commonly known as:  LEVAQUIN Take 1 tablet (750 mg total) by mouth daily. Start taking on:  12/06/2017   lisinopril 5 MG tablet Commonly known as:  PRINIVIL,ZESTRIL Take 5 mg by mouth daily.   naphazoline-pheniramine 0.025-0.3 % ophthalmic solution Commonly known as:  NAPHCON-A Place 1 drop into both eyes 4 (four) times daily as needed for eye irritation or allergies.   nitroGLYCERIN 0.3 MG SL tablet Commonly known as:  NITROSTAT Place 0.3 mg under the tongue every 5 (five) minutes as needed for chest pain.   Potassium 99 MG Tabs Take 99 mg by mouth daily.   primidone 50 MG tablet Commonly known as:  MYSOLINE Take 200 mg by mouth 2 (two) times daily. 4 tabs   spironolactone 25 MG tablet Commonly known as:  ALDACTONE Take 12.5 mg by mouth daily.   torsemide 20 MG tablet Commonly known as:  DEMADEX Take 20 mg by mouth  daily.   umeclidinium-vilanterol 62.5-25 MCG/INH Aepb Commonly known as:  ANORO ELLIPTA Inhale 1 puff into the lungs daily.        DISCHARGE INSTRUCTIONS:    Follow-up with Dr. at rehab 1 day  If you experience worsening of your admission symptoms, develop shortness of breath, life threatening emergency, suicidal or homicidal thoughts you must seek medical attention immediately by calling 911 or calling your MD immediately  if symptoms less severe.  You Must read complete instructions/literature along with all the possible adverse reactions/side effects for all the Medicines you take and that have been prescribed to you. Take any new Medicines after you have completely understood and accept all the possible adverse reactions/side effects.   Please note  You were cared for by  a hospitalist during your hospital stay. If you have any questions about your discharge medications or the care you received while you were in the hospital after you are discharged, you can call the unit and asked to speak with the hospitalist on call if the hospitalist that took care of you is not available. Once you are discharged, your primary care physician will handle any further medical issues. Please note that NO REFILLS for any discharge medications will be authorized once you are discharged, as it is imperative that you return to your primary care physician (or establish a relationship with a primary care physician if you do not have one) for your aftercare needs so that they can reassess your need for medications and monitor your lab values.    Today   CHIEF COMPLAINT:   Chief Complaint  Patient presents with  . Knee Pain    HISTORY OF PRESENT ILLNESS:  Dylan [name]  is a 82 y.o. male was admitted with clinical sepsis and pneumonia   VITAL SIGNS:  Blood pressure (!) 147/84, pulse 85, temperature 99.2 F (37.3 C), temperature source Oral, resp. rate 20, height 5\' 9"  (1.753 m), weight (!) 136.6 kg  (301 lb 2.4 oz), SpO2 97 %.   PHYSICAL EXAMINATION:  GENERAL:  82 y.o.-year-old patient lying in the bed with no acute distress.  EYES: Pupils equal, round, reactive to light and accommodation. No scleral icterus. Extraocular muscles intact.  HEENT: Head atraumatic, normocephalic. Oropharynx and nasopharynx clear.  NECK:  Supple, no jugular venous distention. No thyroid enlargement, no tenderness.  LUNGS:   decreased breath sounds bilateral  bases, no wheezing, rales,rhonchi or crepitation. No use of accessory muscles of respiration.  CARDIOVASCULAR: S1, S2 normal. No murmurs, rubs, or gallops.  ABDOMEN: Soft, non-tender, non-distended. Bowel sounds present. No organomegaly or mass.  EXTREMITIES: 2+ edema, no cyanosis, or clubbing.  NEUROLOGIC: Cranial nerves II through XII are intact. Muscle strength 5/5 in all extremities. Sensation intact. Gait not checked.  PSYCHIATRIC: The patient is alert and oriented x 3.  SKIN: Chronic lower extremity skin discoloration.  Wound VAC right leg  DATA REVIEW:   CBC Recent Labs  Lab 12/05/17 0512  WBC 8.6  HGB 11.8*  HCT 35.4*  PLT 169    Chemistries  Recent Labs  Lab 12/01/17 1253  12/04/17 0613 12/05/17 0512  NA 138   < > 139 139  K 3.0*   < > 3.3* 3.6  CL 100*   < > 104 103  CO2 28   < > 26 27  GLUCOSE 160*   < > 101* 110*  BUN 53*   < > 47* 42*  CREATININE 1.80*   < > 1.66* 1.45*  CALCIUM 8.3*   < > 7.9* 8.0*  MG  --   --  2.0  --   AST 45*  --   --   --   ALT 44  --   --   --   ALKPHOS 93  --   --   --   BILITOT 0.7  --   --   --    < > = values in this interval not displayed.    Cardiac Enzymes Recent Labs  Lab 12/01/17 1253  TROPONINI 0.12*    Microbiology Results  Results for orders placed or performed during the hospital encounter of 12/01/17  Blood Culture (routine x 2)     Status: None (Preliminary result)   Collection Time: 12/01/17 12:53 PM  Result  Value Ref Range Status   Specimen Description BLOOD RIGHT  ARM  Final   Special Requests   Final    BOTTLES DRAWN AEROBIC AND ANAEROBIC Blood Culture results may not be optimal due to an excessive volume of blood received in culture bottles   Culture   Final    NO GROWTH 4 DAYS Performed at [facility], [address], [place], [place] [address]    Report Status PENDING  Incomplete  Blood Culture (routine x 2)     Status: None (Preliminary result)   Collection Time: 12/01/17  3:55 PM  Result Value Ref Range Status   Specimen Description BLOOD BLOOD LEFT ARM  Final   Special Requests   Final    BOTTLES DRAWN AEROBIC AND ANAEROBIC Blood Culture adequate volume   Culture   Final    NO GROWTH 4 DAYS Performed at [facility], [address], [place], [place] [address]    Report Status PENDING  Incomplete  MRSA PCR Screening     Status: None   Collection Time: 12/02/17  5:58 AM  Result Value Ref Range Status   MRSA by PCR NEGATIVE NEGATIVE Final    Comment:        The GeneXpert MRSA Assay (FDA approved for NASAL specimens only), is one component of a comprehensive MRSA colonization surveillance program. It is not intended to diagnose MRSA infection nor to guide or monitor treatment for MRSA infections. Performed at [facility], [address] [place], [place] [address]       Management plans discussed with the patient, and he wanted to get out of the hospital today rather than wait till tomorrow.  I will set up discharge for today.  CODE STATUS:     Code Status Orders  (From admission, onward)        Start     Ordered   12/01/17 1924  Full code  Continuous     12/01/17 1923    Code Status History    Date Active Date Inactive Code Status Order ID Comments User Context   08/31/2017 0557 09/05/2017 1959 Full Code [id number]  Arnaldo Natal, MD Inpatient   06/10/2016 1934 06/13/2016 1816 Full Code [id number]  Shaune Pollack, MD Inpatient   12/15/2015 0134 12/16/2015 1507 Full Code [id number]  Ihor Austin, MD ED    Advance Directive Documentation     Most Recent Value  Type of Advance Directive  Healthcare Power of Attorney, Living will  Pre-existing out of facility DNR order (yellow form or pink MOST form)  -  "MOST" Form in Place?  -      TOTAL TIME TAKING CARE OF THIS PATIENT: 35 minutes.    Alford Highland M.D on 12/05/2017 at 11:46 AM  Between 7am to 6pm - Pager - [phone]  After 6pm go to [email or web address] - password [email or web address]  [facility] Office  [phone]  CC: Primary care physician; [name], MD

## 2017-12-05 NOTE — Progress Notes (Signed)
Physical Therapy Treatment Patient Details Name: Dylan [name] MRN: [id number] DOB: [birth year] Today's Date: 12/05/2017    History of Present Illness Pt is an 82 year old male admitted for community acquired pneumonia and cellulitis of R LE.  Pt also reports having a fall in the past week which has caused L hip pain.  PMH includes tremor, L shoulder pain, arthritis, MI, OA, Htn, HLD, R foot drop, COPD, CHF and cervicalgia.    PT Comments    Pt limited this session by back pain with movement during limited bed mobility training.  Pt tolerated below therapeutic exercises but required rest breaks and only limited RLE AAROM secondary to pain.  Discussed at length physiological benefits of activity and importance of progressing activity levels and spending time in chair vs in the bed with pt agreeing but unwilling to try this date.  Pt will benefit from PT services in a SNF setting upon discharge to safely address above deficits for decreased caregiver assistance and eventual return to PLOF.     Follow Up Recommendations  SNF     Equipment Recommendations       Recommendations for Other Services       Precautions / Restrictions Precautions Precautions: Fall Precaution Comments: High fall risk Restrictions Weight Bearing Restrictions: No    Mobility  Bed Mobility   Bed Mobility: Rolling(Gentle rolling L and R)           General bed mobility comments: Pt reported no pain at rest but pt refused to sit up to EOB secondary to back pain during limited rolling L and R, MD aware  Transfers                 General transfer comment: Pt declined to attept secondary to back pain  Ambulation/Gait                 Stairs            Wheelchair Mobility    Modified Rankin (Stroke Patients Only)       Balance                                            Cognition Arousal/Alertness: Awake/alert Behavior During Therapy: WFL for tasks  assessed/performed Overall Cognitive Status: Within Functional Limits for tasks assessed                                        Exercises Total Joint Exercises Ankle Circles/Pumps: AROM;Left;10 reps;15 reps Quad Sets: Strengthening;Both;10 reps;15 reps Gluteal Sets: Strengthening;Both;10 reps;15 reps Heel Slides: Both;AAROM;10 reps;Other reps (comment)(Gentle AAROM on the R with very limited amplitude x 5 reps) Hip ABduction/ADduction: AAROM;Both;10 reps(Gentle AAROM on the R with very limited amplitude x 5 reps) Straight Leg Raises: AAROM;Both;10 reps(Gentle AAROM on the R with very limited amplitude x 5 reps) Other Exercises Other Exercises: HEP education with pt for LLE APs and BLE QS and GS x 10 each 5-6x/day Other Exercises: Pt education provided regarding physiological benefits of activities and benefits of being out of bed and in chair    General Comments        Pertinent Vitals/Pain Pain Assessment: No/denies pain    Home Living  Prior Function            PT Goals (current goals can now be found in the care plan section)      Frequency    Min 2X/week      PT Plan Current plan remains appropriate    Co-evaluation              AM-PAC PT "6 Clicks" Daily Activity  Outcome Measure                   End of Session   Activity Tolerance: Patient limited by pain Patient left: in bed;with call bell/phone within reach;with bed alarm set Nurse Communication: Mobility status PT Visit Diagnosis: Muscle weakness (generalized) (M62.81);History of falling (Z91.81)     Time: [id number] PT Time Calculation (min) (ACUTE ONLY): 27 min  Charges:  $Therapeutic Exercise: 8-22 mins $Therapeutic Activity: 8-22 mins                    G Codes:       Dylan [name]. Dylan [name] PT, DPT 12/05/17, 11:53 AM

## 2017-12-05 NOTE — Progress Notes (Signed)
Pt prepared for d/c to SNF. IV d/c'd. Skin intact except as charted in most recent assessments. Vitals are stable. Wound Vac dressing changed and dry and intact. Wound Vac was clamped when EMS arrived for transport. Report called to receiving facility. Pt to be transported by ambulance service.  Briton Sellman [place]

## 2017-12-06 LAB — CULTURE, BLOOD (ROUTINE X 2)
CULTURE: NO GROWTH
Culture: NO GROWTH
Special Requests: ADEQUATE

## 2017-12-15 ENCOUNTER — Encounter: Admission: RE | Payer: Self-pay | Source: Ambulatory Visit

## 2017-12-15 ENCOUNTER — Inpatient Hospital Stay: Admission: RE | Admit: 2017-12-15 | Payer: Medicare Other | Source: Ambulatory Visit | Admitting: Orthopedic Surgery

## 2017-12-15 SURGERY — ARTHROPLASTY, KNEE, TOTAL, USING IMAGELESS COMPUTER-ASSISTED NAVIGATION
Anesthesia: Choice | Laterality: Left

## 2017-12-23 ENCOUNTER — Ambulatory Visit (INDEPENDENT_AMBULATORY_CARE_PROVIDER_SITE_OTHER): Payer: Medicare Other | Admitting: Vascular Surgery

## 2017-12-23 ENCOUNTER — Encounter (INDEPENDENT_AMBULATORY_CARE_PROVIDER_SITE_OTHER): Payer: Self-pay | Admitting: Vascular Surgery

## 2017-12-23 VITALS — BP 153/82 | HR 74 | Resp 18 | Ht 70.0 in | Wt 282.0 lb

## 2017-12-23 DIAGNOSIS — S8011XD Contusion of right lower leg, subsequent encounter: Secondary | ICD-10-CM

## 2017-12-23 NOTE — Progress Notes (Signed)
Subjective:    Patient ID: Dylan [name], male    DOB: 10-15-1935, 82 y.o.   MRN: [id number] Chief Complaint  Patient presents with  . Follow-up    Wound check   Patient presents for his first postoperative follow-up.  The patient is status post a right lower extremity wound debridement with VAC placement on November 13, 2017.  The patient has been residing in a rehab and will be discharged within the next day or two to home.  The patient presents today for a wound check.  The patient was seen with his son.  The patient and his son note a market improvement to the patient's wound.  The wound is much smaller and healthier looking.  The patient is really eager to stop VAC therapy and transition to Unna wraps.  The patient tenuous to need a left knee replacement however Dr. Ernest Pine will not move forward with this until the wound on his right lower extremity is completely healed.  The patient notes decreased drainage to the [facility] machine.  The patient denies any new ulcer formation.  The patient denies any fever, nausea vomiting.  Review of Systems  Constitutional: Negative.   HENT: Negative.   Eyes: Negative.   Respiratory: Negative.   Cardiovascular: Positive for leg swelling.  Gastrointestinal: Negative.   Endocrine: Negative.   Genitourinary: Negative.   Musculoskeletal: Negative.   Skin: Positive for wound.  Allergic/Immunologic: Negative.   Neurological: Negative.   Hematological: Negative.   Psychiatric/Behavioral: Negative.       Objective:   Physical Exam  Constitutional: He is oriented to person, place, and time. He appears well-developed and well-nourished. No distress.  HENT:  Head: Normocephalic and atraumatic.  Right Ear: External ear normal.  Left Ear: External ear normal.  Eyes: Pupils are equal, round, and reactive to light. Conjunctivae and EOM are normal.  Neck: Normal range of motion.  Cardiovascular: Normal rate, regular rhythm and normal heart sounds.    Pulmonary/Chest: Effort normal.  Musculoskeletal: Normal range of motion. He exhibits edema (Mild nonpitting edema noted to the bilateral lower extremity).  Neurological: He is alert and oriented to person, place, and time.  Skin: He is not diaphoretic.  Right lower extremity calf: Undermining is now resolved.  Wound is a lot smaller.  Approximately 5 cm x 3 cm.  Very shallow 100% granulation tissue noted to the wound bed.  There is no drainage.  There is no surrounding erythema.  Psychiatric: He has a normal mood and affect. His behavior is normal. Judgment and thought content normal.  Vitals reviewed.  BP (!) 153/82 (BP Location: Right Arm, Patient Position: Sitting)   Pulse 74   Resp 18   Ht 5\' 10"  (1.778 m)   Wt 282 lb (127.9 kg)   BMI 40.46 kg/m   Past Medical History:  Diagnosis Date  . CAD (coronary artery disease)   . Cervicalgia   . CHF (congestive heart failure) (HCC)   . COPD (chronic obstructive pulmonary disease) (HCC)   . Diastolic heart failure (HCC)   . Foot drop, right   . History of kidney stones   . Hyperlipidemia    unspecified  . Hypertension   . Myocardial infarction (HCC)   . Osteoarthritis   . Shoulder pain, left   . Sleep apnea   . Tremor, essential    Social History   Socioeconomic History  . Marital status: Married    Spouse name: [name]  . Number of children: 2  .  Years of education: 77  . Highest education level: 11th grade  Occupational History  . Occupation: retired  Engineer, production  . Financial resource strain: Not on file  . Food insecurity:    Worry: Not on file    Inability: Not on file  . Transportation needs:    Medical: Not on file    Non-medical: Not on file  Tobacco Use  . Smoking status: Light Tobacco Smoker    Packs/day: 0.25    Years: 58.00    Pack years: 14.50    Types: Cigarettes  . Smokeless tobacco: Former Neurosurgeon  . Tobacco comment: 1 cigarette per day  Substance and Sexual Activity  . Alcohol use: No     Alcohol/week: 0.0 oz  . Drug use: No  . Sexual activity: Not on file  Lifestyle  . Physical activity:    Days per week: Not on file    Minutes per session: Not on file  . Stress: Not on file  Relationships  . Social connections:    Talks on phone: Not on file    Gets together: Not on file    Attends religious service: Not on file    Active member of club or organization: Not on file    Attends meetings of clubs or organizations: Not on file    Relationship status: Not on file  . Intimate partner violence:    Fear of current or ex partner: Not on file    Emotionally abused: Not on file    Physically abused: Not on file    Forced sexual activity: Not on file  Other Topics Concern  . Not on file  Social History Narrative   Full Code with HCPOA and living will   Married with 2 children   Former smoker   Alcohol - none   Smokeless tobacco - none   Past Surgical History:  Procedure Laterality Date  . APPLICATION OF WOUND VAC Right 11/13/2017   Procedure: APPLICATION OF WOUND VAC;  Surgeon: Annice Needy, MD;  Location: [facility] ORS;  Service: Vascular;  Laterality: Right;  . BACK SURGERY  06/2010  . CARDIAC CATHETERIZATION Left 04/30/2016   Procedure: Left Heart Cath and Coronary Angiography;  Surgeon: Laurier Nancy, MD;  Location: [facility] INVASIVE CV LAB;  Service: Cardiovascular;  Laterality: Left;  . COLONOSCOPY    . CORONARY ANGIOPLASTY    . KNEE ARTHROSCOPY Left   . KNEE SURGERY Left 1998  . TONSILLECTOMY    . WOUND DEBRIDEMENT Right 11/13/2017   Procedure: DEBRIDEMENT WOUND;  Surgeon: Annice Needy, MD;  Location: [facility] ORS;  Service: Vascular;  Laterality: Right;   Family History  Problem Relation Age of Onset  . Diabetes Son   . Cancer Mother   . Colon cancer Mother   . Lung cancer Mother   . Tremor Father   . Heart disease Father   . Tremor Brother    No Known Allergies     Assessment & Plan:  Patient presents for his first postoperative follow-up.  The patient is  status post a right lower extremity wound debridement with VAC placement on November 13, 2017.  The patient has been residing in a rehab and will be discharged within the next day or two to home.  The patient presents today for a wound check.  The patient was seen with his son.  The patient and his son note a market improvement to the patient's wound.  The wound is much smaller and healthier  looking.  The patient is really eager to stop VAC therapy and transition to Unna wraps.  The patient tenuous to need a left knee replacement however Dr. Ernest Pine will not move forward with this until the wound on his right lower extremity is completely healed.  The patient notes decreased drainage to the [facility] machine.  The patient denies any new ulcer formation.  The patient denies any fever, nausea vomiting.  1) right lower extremity hematoma - stable The patient is status post a debridement and VAC placement on November 13, 2017 The patient has seen an improvement to the undermining in total size of the wound. Granulation tissue noted to the wound bed Recommend continuing VAC therapy until the wound is much smaller and almost healed as a fear if we remove the VAC now we will lose any progress that we have made. The VAC should be changed every other day The patient should elevate his legs heart level or higher as much as possible The VAC will also speed the wound healing process as the patient is in need of a left total knee replacement and he cannot move forward with this until the wound is completely healed. The patient is to follow-up in 4-6 weeks for another wound check  Current Outpatient Medications on File Prior to Visit  Medication Sig Dispense Refill  . acetaminophen (TYLENOL) 325 MG tablet Take 2 tablets (650 mg total) by mouth every 6 (six) hours as needed for mild pain or fever.    Marland Kitchen aspirin EC 81 MG tablet Take 81 mg by mouth daily.    Marland Kitchen atorvastatin (LIPITOR) 40 MG tablet Take 40 mg by mouth every evening.     . carvedilol (COREG) 3.125 MG tablet Take 1 tablet (3.125 mg total) by mouth 2 (two) times daily with a meal. Hold for HR less than 55 60 tablet 0  . Chlorpheniramine Maleate (ALLERGY PO) Take 1 tablet by mouth daily as needed (allergies).    . docusate sodium (COLACE) 100 MG capsule Take 200 mg by mouth at bedtime.     Marland Kitchen doxycycline (VIBRA-TABS) 100 MG tablet Take 1 tablet (100 mg total) by mouth every 12 (twelve) hours. 18 tablet 0  . fluticasone (FLONASE) 50 MCG/ACT nasal spray Place 1 spray into both nostrils daily.    Marland Kitchen gabapentin (NEURONTIN) 400 MG capsule Take 400 mg by mouth at bedtime.    Marland Kitchen HYDROcodone-acetaminophen (NORCO/VICODIN) 5-325 MG tablet Take 1 tablet by mouth every 6 (six) hours as needed for moderate pain or severe pain. 20 tablet 0  . Infant Care Products (DERMACLOUD) CREA Apply liberal amount topically to area of skin irritation as needed. Ok to leave at bedside.    Marland Kitchen ipratropium-albuterol (DUONEB) 0.5-2.5 (3) MG/3ML SOLN Take 3 mLs by nebulization every 6 (six) hours as needed (shortness of breath).     . isosorbide mononitrate (IMDUR) 30 MG 24 hr tablet Take 30 mg by mouth daily.     Marland Kitchen lactose free nutrition (BOOST) LIQD Take 237 mLs by mouth daily.    Marland Kitchen levofloxacin (LEVAQUIN) 750 MG tablet Take 1 tablet (750 mg total) by mouth daily. 6 tablet 0  . lisinopril (PRINIVIL,ZESTRIL) 5 MG tablet Take 5 mg by mouth daily.     . Multiple Vitamins-Minerals (CENTROVITE) TABS Take 1 tablet by mouth daily.    . naphazoline-pheniramine (NAPHCON-A) 0.025-0.3 % ophthalmic solution Place 1 drop into both eyes 4 (four) times daily as needed for eye irritation or allergies. 15 mL 0  .  nitroGLYCERIN (NITROSTAT) 0.3 MG SL tablet Place 0.3 mg under the tongue every 5 (five) minutes as needed for chest pain.    Marland Kitchen. Potassium 99 MG TABS Take 99 mg by mouth daily.    . primidone (MYSOLINE) 50 MG tablet Take 200 mg by mouth 2 (two) times daily. 4 tabs    . Skin Protectants, Misc. (ENDIT EX) Apply  topically to sacrum/buttocks 2 times daily and as needed for irritation    . spironolactone (ALDACTONE) 25 MG tablet Take 12.5 mg by mouth daily.     Marland Kitchen. torsemide (DEMADEX) 20 MG tablet Take 20 mg by mouth daily.     Marland Kitchen. umeclidinium-vilanterol (ANORO ELLIPTA) 62.5-25 MCG/INH AEPB Inhale 1 puff into the lungs daily.     No current facility-administered medications on file prior to visit.    There are no Patient Instructions on file for this visit. Return in about 1 month (around 01/22/2018), or if symptoms worsen or fail to improve.  Phyllis Whitefield A Katalina Magri, PA-C

## 2017-12-24 ENCOUNTER — Telehealth (INDEPENDENT_AMBULATORY_CARE_PROVIDER_SITE_OTHER): Payer: Self-pay

## 2017-12-24 NOTE — Telephone Encounter (Signed)
Nurse called from [facility] to let you know that the patient is refusing the wound VAC today. She states that the patient wants to wait until he goes home tomorrow so that he can use his own wound VAC.  FYI---So today he did only accept a wet to dry wrap.

## 2017-12-26 ENCOUNTER — Telehealth (INDEPENDENT_AMBULATORY_CARE_PROVIDER_SITE_OTHER): Payer: Self-pay

## 2017-12-26 NOTE — Telephone Encounter (Signed)
Called Trey PaulaJeff back and gave him the order verbally.

## 2017-12-26 NOTE — Telephone Encounter (Signed)
Trey PaulaJeff the nurse with [facility] was calling stating that the patient has now been released from [facility] and he needs new orders for the patients wound vac care? He says that he had been changing the wound vac three times a week.  Would three times a week be okay moving forward? Is there any other instructions for this patient and his wound Vac care?

## 2018-01-09 ENCOUNTER — Telehealth (INDEPENDENT_AMBULATORY_CARE_PROVIDER_SITE_OTHER): Payer: Self-pay

## 2018-01-09 NOTE — Telephone Encounter (Signed)
Vikki Ports from [facility] called and left a message regarding the patient's wound vac and requesting a call back. I have attempted to contact Vikki Ports with the number and extension she left and was unable to make contact or leave a message for a return call.

## 2018-01-10 ENCOUNTER — Emergency Department: Payer: Medicare Other

## 2018-01-10 ENCOUNTER — Other Ambulatory Visit: Payer: Self-pay

## 2018-01-10 ENCOUNTER — Inpatient Hospital Stay
Admission: EM | Admit: 2018-01-10 | Discharge: 2018-01-14 | DRG: 871 | Disposition: A | Discharge status: Home Health | Payer: Medicare Other | Attending: Internal Medicine | Admitting: Internal Medicine

## 2018-01-10 DIAGNOSIS — Z9861 Coronary angioplasty status: Secondary | ICD-10-CM

## 2018-01-10 DIAGNOSIS — S81801A Unspecified open wound, right lower leg, initial encounter: Secondary | ICD-10-CM | POA: Diagnosis not present

## 2018-01-10 DIAGNOSIS — G9341 Metabolic encephalopathy: Secondary | ICD-10-CM | POA: Diagnosis present

## 2018-01-10 DIAGNOSIS — I252 Old myocardial infarction: Secondary | ICD-10-CM | POA: Diagnosis not present

## 2018-01-10 DIAGNOSIS — I130 Hypertensive heart and chronic kidney disease with heart failure and stage 1 through stage 4 chronic kidney disease, or unspecified chronic kidney disease: Secondary | ICD-10-CM | POA: Diagnosis present

## 2018-01-10 DIAGNOSIS — I739 Peripheral vascular disease, unspecified: Secondary | ICD-10-CM | POA: Diagnosis present

## 2018-01-10 DIAGNOSIS — L97919 Non-pressure chronic ulcer of unspecified part of right lower leg with unspecified severity: Secondary | ICD-10-CM | POA: Diagnosis present

## 2018-01-10 DIAGNOSIS — R531 Weakness: Secondary | ICD-10-CM | POA: Diagnosis present

## 2018-01-10 DIAGNOSIS — N183 Chronic kidney disease, stage 3 (moderate): Secondary | ICD-10-CM | POA: Diagnosis present

## 2018-01-10 DIAGNOSIS — L03115 Cellulitis of right lower limb: Secondary | ICD-10-CM | POA: Diagnosis present

## 2018-01-10 DIAGNOSIS — M21371 Foot drop, right foot: Secondary | ICD-10-CM | POA: Diagnosis present

## 2018-01-10 DIAGNOSIS — Z87442 Personal history of urinary calculi: Secondary | ICD-10-CM | POA: Diagnosis not present

## 2018-01-10 DIAGNOSIS — Z6841 Body Mass Index (BMI) 40.0 and over, adult: Secondary | ICD-10-CM | POA: Diagnosis not present

## 2018-01-10 DIAGNOSIS — G250 Essential tremor: Secondary | ICD-10-CM | POA: Diagnosis present

## 2018-01-10 DIAGNOSIS — S8011XA Contusion of right lower leg, initial encounter: Secondary | ICD-10-CM | POA: Diagnosis present

## 2018-01-10 DIAGNOSIS — G4733 Obstructive sleep apnea (adult) (pediatric): Secondary | ICD-10-CM | POA: Diagnosis present

## 2018-01-10 DIAGNOSIS — Z8249 Family history of ischemic heart disease and other diseases of the circulatory system: Secondary | ICD-10-CM

## 2018-01-10 DIAGNOSIS — I2510 Atherosclerotic heart disease of native coronary artery without angina pectoris: Secondary | ICD-10-CM | POA: Diagnosis present

## 2018-01-10 DIAGNOSIS — E785 Hyperlipidemia, unspecified: Secondary | ICD-10-CM | POA: Diagnosis present

## 2018-01-10 DIAGNOSIS — F329 Major depressive disorder, single episode, unspecified: Secondary | ICD-10-CM | POA: Diagnosis present

## 2018-01-10 DIAGNOSIS — Z801 Family history of malignant neoplasm of trachea, bronchus and lung: Secondary | ICD-10-CM

## 2018-01-10 DIAGNOSIS — X58XXXA Exposure to other specified factors, initial encounter: Secondary | ICD-10-CM | POA: Diagnosis present

## 2018-01-10 DIAGNOSIS — M1712 Unilateral primary osteoarthritis, left knee: Secondary | ICD-10-CM | POA: Diagnosis present

## 2018-01-10 DIAGNOSIS — Z79899 Other long term (current) drug therapy: Secondary | ICD-10-CM

## 2018-01-10 DIAGNOSIS — A419 Sepsis, unspecified organism: Secondary | ICD-10-CM | POA: Diagnosis not present

## 2018-01-10 DIAGNOSIS — F17210 Nicotine dependence, cigarettes, uncomplicated: Secondary | ICD-10-CM | POA: Diagnosis present

## 2018-01-10 DIAGNOSIS — I5032 Chronic diastolic (congestive) heart failure: Secondary | ICD-10-CM | POA: Diagnosis present

## 2018-01-10 DIAGNOSIS — Z800 Family history of malignant neoplasm of digestive organs: Secondary | ICD-10-CM

## 2018-01-10 DIAGNOSIS — G629 Polyneuropathy, unspecified: Secondary | ICD-10-CM | POA: Diagnosis present

## 2018-01-10 DIAGNOSIS — J449 Chronic obstructive pulmonary disease, unspecified: Secondary | ICD-10-CM | POA: Diagnosis present

## 2018-01-10 DIAGNOSIS — I872 Venous insufficiency (chronic) (peripheral): Secondary | ICD-10-CM | POA: Diagnosis present

## 2018-01-10 DIAGNOSIS — Z993 Dependence on wheelchair: Secondary | ICD-10-CM

## 2018-01-10 DIAGNOSIS — F419 Anxiety disorder, unspecified: Secondary | ICD-10-CM | POA: Diagnosis present

## 2018-01-10 DIAGNOSIS — Z7982 Long term (current) use of aspirin: Secondary | ICD-10-CM

## 2018-01-10 DIAGNOSIS — Z9981 Dependence on supplemental oxygen: Secondary | ICD-10-CM

## 2018-01-10 DIAGNOSIS — Z833 Family history of diabetes mellitus: Secondary | ICD-10-CM

## 2018-01-10 LAB — COMPREHENSIVE METABOLIC PANEL
ALK PHOS: 109 U/L (ref 38–126)
ALT: 73 U/L — AB (ref 17–63)
AST: 69 U/L — AB (ref 15–41)
Albumin: 3.2 g/dL — ABNORMAL LOW (ref 3.5–5.0)
Anion gap: 8 (ref 5–15)
BUN: 41 mg/dL — AB (ref 6–20)
CALCIUM: 8.9 mg/dL (ref 8.9–10.3)
CO2: 30 mmol/L (ref 22–32)
CREATININE: 1.48 mg/dL — AB (ref 0.61–1.24)
Chloride: 103 mmol/L (ref 101–111)
GFR calc non Af Amer: 43 mL/min — ABNORMAL LOW (ref >60)
GFR, EST AFRICAN AMERICAN: 50 mL/min — AB (ref >60)
GLUCOSE: 143 mg/dL — AB (ref 65–99)
Potassium: 3.7 mmol/L (ref 3.5–5.1)
SODIUM: 141 mmol/L (ref 135–145)
Total Bilirubin: 0.6 mg/dL (ref 0.3–1.2)
Total Protein: 6.6 g/dL (ref 6.5–8.1)

## 2018-01-10 LAB — URINALYSIS, ROUTINE W REFLEX MICROSCOPIC
Bacteria, UA: NONE SEEN
Bilirubin Urine: NEGATIVE
Glucose, UA: NEGATIVE mg/dL
Hgb urine dipstick: NEGATIVE
KETONES UR: NEGATIVE mg/dL
LEUKOCYTES UA: NEGATIVE
Nitrite: NEGATIVE
PROTEIN: 30 mg/dL — AB
Specific Gravity, Urine: 1.013 (ref 1.005–1.030)
Squamous Epithelial / LPF: NONE SEEN (ref 0–5)
pH: 7 (ref 5.0–8.0)

## 2018-01-10 LAB — CBC WITH DIFFERENTIAL/PLATELET
BASOS PCT: 0 %
Basophils Absolute: 0 10*3/uL (ref 0–0.1)
EOS PCT: 1 %
Eosinophils Absolute: 0.1 10*3/uL (ref 0–0.7)
HCT: 37.4 % — ABNORMAL LOW (ref 40.0–52.0)
Hemoglobin: 12.5 g/dL — ABNORMAL LOW (ref 13.0–18.0)
Lymphocytes Relative: 6 %
Lymphs Abs: 1.1 10*3/uL (ref 1.0–3.6)
MCH: 31.7 pg (ref 26.0–34.0)
MCHC: 33.4 g/dL (ref 32.0–36.0)
MCV: 94.9 fL (ref 80.0–100.0)
MONO ABS: 0.7 10*3/uL (ref 0.2–1.0)
MONOS PCT: 4 %
Neutro Abs: 16.7 10*3/uL — ABNORMAL HIGH (ref 1.4–6.5)
Neutrophils Relative %: 89 %
Platelets: 144 10*3/uL — ABNORMAL LOW (ref 150–440)
RBC: 3.94 MIL/uL — ABNORMAL LOW (ref 4.40–5.90)
RDW: 16.5 % — AB (ref 11.5–14.5)
WBC: 18.5 10*3/uL — ABNORMAL HIGH (ref 3.8–10.6)

## 2018-01-10 LAB — LACTIC ACID, PLASMA: Lactic Acid, Venous: 1.6 mmol/L (ref 0.5–1.9)

## 2018-01-10 MED ORDER — PIPERACILLIN-TAZOBACTAM 3.375 G IVPB 30 MIN
3.3750 g | Freq: Once | INTRAVENOUS | Status: AC
  Administered 2018-01-10: 3.375 g via INTRAVENOUS
  Filled 2018-01-10: qty 50

## 2018-01-10 MED ORDER — VANCOMYCIN HCL IN DEXTROSE 1-5 GM/200ML-% IV SOLN
1000.0000 mg | Freq: Once | INTRAVENOUS | Status: AC
  Administered 2018-01-10: 1000 mg via INTRAVENOUS
  Filled 2018-01-10: qty 200

## 2018-01-10 MED ORDER — VANCOMYCIN HCL 10 G IV SOLR
1250.0000 mg | Freq: Two times a day (BID) | INTRAVENOUS | Status: DC
  Administered 2018-01-11 – 2018-01-12 (×3): 1250 mg via INTRAVENOUS
  Filled 2018-01-10 (×4): qty 1250

## 2018-01-10 MED ORDER — ACETAMINOPHEN 500 MG PO TABS
1000.0000 mg | ORAL_TABLET | Freq: Once | ORAL | Status: AC
  Administered 2018-01-10: 1000 mg via ORAL

## 2018-01-10 MED ORDER — ACETAMINOPHEN 500 MG PO TABS
ORAL_TABLET | ORAL | Status: AC
  Administered 2018-01-10: 1000 mg via ORAL
  Filled 2018-01-10: qty 2

## 2018-01-10 MED ORDER — PIPERACILLIN-TAZOBACTAM 3.375 G IVPB
3.3750 g | Freq: Three times a day (TID) | INTRAVENOUS | Status: DC
  Administered 2018-01-11 – 2018-01-14 (×10): 3.375 g via INTRAVENOUS
  Filled 2018-01-10 (×10): qty 50

## 2018-01-10 NOTE — H&P (Addendum)
[facility] [place] [facility]   PATIENT NAME: Dylan [name]    MR#:  [id number]  DATE OF BIRTH:  [birth year]  DATE OF ADMISSION:  01/10/2018  PRIMARY CARE PHYSICIAN: [name], MD   REQUESTING/REFERRING PHYSICIAN:   CHIEF COMPLAINT:   Chief Complaint  Patient presents with  . Fever    HISTORY OF PRESENT ILLNESS: Dylan [name]  is a 82 y.o. male with a known history of CAD, COPD, CHF, peripheral vascular disease, venous insufficiency, tobacco abuse and other comorbidities.  Is wheelchair-bound at baseline. Patient is poor historian and currently short of breath and somewhat lethargic.  He is not able to provide history.  Most of the information was obtained from reviewing the medical records and from discussion with emergency room physician and with the patient's wife. He was brought to emergency room for fever, noticed by patient's wife twice today.  On further discussion with the patient's wife, she feels that the right lower extremity is more red and swollen and that he is more short of breath in the past 2 to 3 days. Patient has been noted with productive cough with yellow sputum, going on for the past 3 days.  He continues to smoke daily and spends most of his day and night sitting in a recliner, as it is easier for him to breathe when sitting up. At the arrival to emergency room patient was noted to be febrile with temperature of 103.1, respiratory rate at 34 heart rate at 95.  Oxygen saturation was 91%. Blood test done emergency room are notable for elevated WBC at 18,000 and creatinine level of 1.48.  Glucose is 143.  Lactic acid is 1.6.  AST 69, ALT 73.  UA is negative for UTI. Chest x-ray is negative for acute cardiopulmonary disease. A recent 2D echo from last month, shows ejection fraction around 60%. Patient is admitted for further evaluation and treatment.   PAST MEDICAL HISTORY:   Past Medical History:  Diagnosis Date  . CAD (coronary artery  disease)   . Cervicalgia   . CHF (congestive heart failure) (HCC)   . COPD (chronic obstructive pulmonary disease) (HCC)   . Diastolic heart failure (HCC)   . Foot drop, right   . History of kidney stones   . Hyperlipidemia    unspecified  . Hypertension   . Myocardial infarction (HCC)   . Osteoarthritis   . Shoulder pain, left   . Sleep apnea   . Tremor, essential     PAST SURGICAL HISTORY:  Past Surgical History:  Procedure Laterality Date  . APPLICATION OF WOUND VAC Right 11/13/2017   Procedure: APPLICATION OF WOUND VAC;  Surgeon: Annice Needy, MD;  Location: [facility] ORS;  Service: Vascular;  Laterality: Right;  . BACK SURGERY  06/2010  . CARDIAC CATHETERIZATION Left 04/30/2016   Procedure: Left Heart Cath and Coronary Angiography;  Surgeon: Laurier Nancy, MD;  Location: [facility] INVASIVE CV LAB;  Service: Cardiovascular;  Laterality: Left;  . COLONOSCOPY    . CORONARY ANGIOPLASTY    . KNEE ARTHROSCOPY Left   . KNEE SURGERY Left 1998  . TONSILLECTOMY    . WOUND DEBRIDEMENT Right 11/13/2017   Procedure: DEBRIDEMENT WOUND;  Surgeon: Annice Needy, MD;  Location: [facility] ORS;  Service: Vascular;  Laterality: Right;    SOCIAL HISTORY:  Social History   Tobacco Use  . Smoking status: Light Tobacco Smoker    Packs/day: 0.25    Years: 58.00  Pack years: 14.50    Types: Cigarettes  . Smokeless tobacco: Former Neurosurgeon  . Tobacco comment: 1 cigarette per day  Substance Use Topics  . Alcohol use: No    Alcohol/week: 0.0 oz    FAMILY HISTORY:  Family History  Problem Relation Age of Onset  . Diabetes Son   . Cancer Mother   . Colon cancer Mother   . Lung cancer Mother   . Tremor Father   . Heart disease Father   . Tremor Brother     DRUG ALLERGIES: No Known Allergies  REVIEW OF SYSTEMS:   CONSTITUTIONAL: Positive for fever, fatigue and generalized weakness.  EYES: No blurred or double vision.  EARS, NOSE, AND THROAT: No tinnitus or ear pain.  RESPIRATORY: Positive for  productive cough, shortness of breath, wheezing; no hemoptysis.  CARDIOVASCULAR: No chest pain.  Positive for bilateral lower extremities chronic edema secondary to venous insufficiency. GASTROINTESTINAL: No nausea, vomiting, diarrhea or abdominal pain.  GENITOURINARY: No dysuria, hematuria.  ENDOCRINE: No polyuria, nocturia,  HEMATOLOGY: No bleeding SKIN: There is dark pigmentation at both ankles secondary to venous insufficiency. Right lower extremity has wound VAC on. MUSCULOSKELETAL: Right lower extremity has wound VAC on.  Patient is wheelchair-bound at baseline. NEUROLOGIC: No focal weakness.  PSYCHIATRY: No anxiety or depression.   MEDICATIONS AT HOME:  Prior to Admission medications   Medication Sig Start Date End Date Taking? Authorizing Provider  acetaminophen (TYLENOL) 325 MG tablet Take 2 tablets (650 mg total) by mouth every 6 (six) hours as needed for mild pain or fever. 12/05/17  Yes Wieting, Richard, MD  aspirin EC 81 MG tablet Take 81 mg by mouth daily.   Yes [provider]  atorvastatin (LIPITOR) 40 MG tablet Take 40 mg by mouth every evening.   Yes [provider]  carvedilol (COREG) 3.125 MG tablet Take 1 tablet (3.125 mg total) by mouth 2 (two) times daily with a meal. Hold for HR less than 55 12/05/17  Yes Wieting, Richard, MD  Chlorpheniramine Maleate (ALLERGY PO) Take 1 tablet by mouth daily as needed (allergies).   Yes [provider]  docusate sodium (COLACE) 100 MG capsule Take 200 mg by mouth at bedtime.    Yes [provider]  fluticasone (FLONASE) 50 MCG/ACT nasal spray Place 1 spray into both nostrils daily. 11/10/17  Yes [provider]  gabapentin (NEURONTIN) 300 MG capsule Take 300 mg by mouth every morning.   Yes [provider]  gabapentin (NEURONTIN) 400 MG capsule Take 400 mg by mouth at bedtime.   Yes [provider]  Infant Care Products (DERMACLOUD) CREA Apply liberal amount topically to area of  skin irritation as needed. Ok to leave at bedside.   Yes [provider]  ipratropium-albuterol (DUONEB) 0.5-2.5 (3) MG/3ML SOLN Take 3 mLs by nebulization every 6 (six) hours as needed (shortness of breath).    Yes [provider]  isosorbide mononitrate (IMDUR) 30 MG 24 hr tablet Take 30 mg by mouth daily.  05/31/16  Yes [provider]  lactose free nutrition (BOOST) LIQD Take 237 mLs by mouth daily.   Yes [provider]  lisinopril (PRINIVIL,ZESTRIL) 5 MG tablet Take 5 mg by mouth daily.    Yes [provider]  Multiple Vitamins-Minerals (CENTROVITE) TABS Take 1 tablet by mouth daily.   Yes [provider]  naphazoline-pheniramine (NAPHCON-A) 0.025-0.3 % ophthalmic solution Place 1 drop into both eyes 4 (four) times daily as needed for eye irritation or  allergies. 12/05/17  Yes Wieting, Richard, MD  nitroGLYCERIN (NITROSTAT) 0.3 MG SL tablet Place 0.3 mg under the tongue every 5 (five) minutes as needed for chest pain.   Yes [provider]  Potassium 99 MG TABS Take 99 mg by mouth daily.   Yes [provider]  primidone (MYSOLINE) 50 MG tablet Take 200 mg by mouth 2 (two) times daily. 4 tabs   Yes [provider]  Skin Protectants, Misc. (ENDIT EX) Apply topically to sacrum/buttocks 2 times daily and as needed for irritation   Yes [provider]  spironolactone (ALDACTONE) 25 MG tablet Take 12.5 mg by mouth daily.    Yes [provider]  torsemide (DEMADEX) 20 MG tablet Take 20 mg by mouth daily.    Yes [provider]  umeclidinium-vilanterol (ANORO ELLIPTA) 62.5-25 MCG/INH AEPB Inhale 1 puff into the lungs daily. 09/04/17  Yes Shaune Pollack, MD  doxycycline (VIBRA-TABS) 100 MG tablet Take 1 tablet (100 mg total) by mouth every 12 (twelve) hours. Patient not taking: Reported on 01/10/2018 12/05/17   Alford Highland, MD  HYDROcodone-acetaminophen (NORCO/VICODIN) 5-325 MG tablet Take 1 tablet by  mouth every 6 (six) hours as needed for moderate pain or severe pain. Patient not taking: Reported on 01/10/2018 12/05/17   Alford Highland, MD  levofloxacin (LEVAQUIN) 750 MG tablet Take 1 tablet (750 mg total) by mouth daily. Patient not taking: Reported on 01/10/2018 12/06/17   Alford Highland, MD      PHYSICAL EXAMINATION:   VITAL SIGNS: Blood pressure (!) 120/41, pulse 85, temperature (!) 103.1 F (39.5 C), temperature source Oral, resp. rate (!) 32, height  (1.778 m), weight 127.9 kg (282 lb), SpO2 95 %.  GENERAL:  82 y.o.-year-old, morbidly obese patient lying in the bed with mild respiratory distress, tachypneic and somnolent.  EYES: Pupils equal, round, reactive to light and accommodation. No scleral icterus.  HEENT: Head atraumatic, normocephalic. Oropharynx and nasopharynx clear.  NECK:  Supple, no jugular venous distention. No thyroid enlargement, no tenderness.  LUNGS: Reduced breath sounds and scattered wheezing are heard bilaterally.  Mild use of accessory muscles of respiration.  CARDIOVASCULAR: S1, S2 normal. No 3/S4.  ABDOMEN: Soft, obese, nontender, nondistended. Bowel sounds present. No organomegaly or mass.  EXTREMITIES: Bilateral, chronic moderate pedal edema, up to the knees, right side worse than left side.  NEUROLOGIC: No focal weakness.  Patient is wheelchair-bound at baseline. PSYCHIATRIC: The patient is alert and oriented x 3.  SKIN: No obvious rash, lesion, or ulcer. There is a wound VAC to the lateral and proximal right calf without any tenderness to palpation.  No fluctuance.  No exudate.  The sponge in the wound VAC is approximately 4 x 6 cm.   LABORATORY PANEL:   CBC Recent Labs  Lab 01/10/18 2039  WBC 18.5*  HGB 12.5*  HCT 37.4*  PLT 144*  MCV 94.9  MCH 31.7  MCHC 33.4  RDW 16.5*  LYMPHSABS 1.1  MONOABS 0.7  EOSABS 0.1  BASOSABS 0.0    ------------------------------------------------------------------------------------------------------------------  Chemistries  Recent Labs  Lab 01/10/18 2039  NA 141  K 3.7  CL 103  CO2 30  GLUCOSE 143*  BUN 41*  CREATININE 1.48*  CALCIUM 8.9  AST 69*  ALT 73*  ALKPHOS 109  BILITOT 0.6   ------------------------------------------------------------------------------------------------------------------ estimated creatinine clearance is 53.5 mL/min (A) (by C-G formula based on SCr of 1.48 mg/dL (H)). ------------------------------------------------------------------------------------------------------------------ No results for input(s): TSH, T4TOTAL, T3FREE, THYROIDAB in the last 72 hours.  Invalid input(s): FREET3   Coagulation profile No results for input(s): INR, PROTIME in the last 168 hours. ------------------------------------------------------------------------------------------------------------------- No results for input(s): DDIMER in the last 72 hours. -------------------------------------------------------------------------------------------------------------------  Cardiac Enzymes No results for input(s): CKMB, TROPONINI, MYOGLOBIN in the last 168 hours.  Invalid input(s): CK ------------------------------------------------------------------------------------------------------------------ Invalid input(s): POCBNP  ---------------------------------------------------------------------------------------------------------------  Urinalysis    Component Value Date/Time   COLORURINE YELLOW (A) 01/10/2018 2042   APPEARANCEUR CLEAR (A) 01/10/2018 2042   LABSPEC 1.013 01/10/2018 2042   PHURINE 7.0 01/10/2018 2042   GLUCOSEU NEGATIVE 01/10/2018 2042   HGBUR NEGATIVE 01/10/2018 2042   BILIRUBINUR NEGATIVE 01/10/2018 2042   KETONESUR NEGATIVE 01/10/2018 2042   PROTEINUR 30 (A) 01/10/2018 2042   UROBILINOGEN 0.2 06/07/2010 0426   NITRITE NEGATIVE 01/10/2018  2042   LEUKOCYTESUR NEGATIVE 01/10/2018 2042     RADIOLOGY: Dg Chest Port 1 View  Result Date: 01/10/2018 CLINICAL DATA:  Cough and fever. EXAM: PORTABLE CHEST 1 VIEW COMPARISON:  12/01/2017 and prior chest radiograph FINDINGS: UPPER limits normal heart size again noted. Mediastinal silhouette is unchanged. Minimal LEFT basilar atelectasis/scarring again noted. There is no evidence of focal airspace disease, pulmonary edema, suspicious pulmonary nodule/mass, pleural effusion, or pneumothorax. No acute bony abnormalities are identified. IMPRESSION: No evidence of acute cardiopulmonary disease. Electronically Signed   By: Harmon Pier M.D.   On: 01/10/2018 21:34    EKG: Orders placed or performed during the hospital encounter of 01/10/18  . EKG 12-Lead  . EKG 12-Lead  . EKG 12-Lead  . EKG 12-Lead  . ED EKG 12-Lead  . ED EKG 12-Lead    IMPRESSION AND PLAN:  1.  Sepsis, of unclear etiology.  Could be related to right lower extremity cellulitis or possible pneumonia, although not obvious per chest x-ray.  We will start IV fluids and broad-spectrum IV antibiotics, while waiting for blood cultures and urine culture  Results. 2.  Right lower extremity cellulitis.  Will cover with broad-spectrum antibiotics. 3.  Right lower extremity chronic venous ulcer.  Continue management per wound care. 4.  Acute COPD exacerbation, continue nebulizer treatment and oxygen therapy as needed, to maintain oxygen saturation around 92 to 94%.  We try to avoid steroids at this time if possible, due to many side effects, including poor wound healing and increased edema when on steroids. 5.  Chronic renal failure, stable, creatinine at baseline.  Continue to monitor kidney function closely and avoid nephrotoxic medications. 6. OSA, will place on CPAP at night. 7.  History of hypertension.  BP is currently on the low side.  Will hold BP meds continue to monitor blood pressure closely, while treating with IV  fluids.  All the records are reviewed and case discussed with ED provider. Management plans discussed with the patient, family and they are in agreement.  CODE STATUS: FULL Code Status History    Date Active Date Inactive Code Status Order ID Comments User Context   12/01/2017 1924 12/05/2017 1948 Full Code [id number]  Altamese Dilling, MD Inpatient   08/31/2017 0557 09/05/2017 1959 Full Code [id number]  Arnaldo Natal, MD Inpatient   06/10/2016 1934 06/13/2016 1816 Full Code [id number]  Shaune Pollack, MD Inpatient   12/15/2015 0134 12/16/2015 1507 Full Code [id number]  Ihor Austin, MD ED       TOTAL TIME TAKING CARE OF THIS PATIENT: 50 minutes.    Cammy Copa M.D on 01/10/2018 at 11:17 PM  Between 7am to 6pm - Pager - [phone]  After 6pm go to [email or web address] - password  [facility]  [place] [facility]  Office  [phone]  CC: Primary care physician; [name], MD

## 2018-01-10 NOTE — Progress Notes (Signed)
CODE SEPSIS - PHARMACY COMMUNICATION  **Broad Spectrum Antibiotics should be administered within 1 hour of Sepsis diagnosis**  Time Code Sepsis Called/Page Received:  21:00   Antibiotics Ordered:  Vanc, Zosyn   Time of 1st antibiotic administration:  5/11 @ 21:10   Additional action taken by pharmacy:  None   If necessary, Name of Provider/Nurse Contacted:     Aadil Sur D ,PharmD Clinical Pharmacist  01/10/2018  9:12 PM

## 2018-01-10 NOTE — ED Provider Notes (Addendum)
[facility] Emergency Department Provider Note  ___________________________________________   First MD Initiated Contact with Patient 01/10/18 2055     (approximate)  I have reviewed the triage vital signs and the nursing notes.   HISTORY  Chief Complaint Fever   HPI Dylan [name] is a 82 y.o. male with a history of CAD, CHF as well as peripheral arterial disease with recurrent cellulitis of the right lower extremity who is presenting with fever.  He is accompanied by his wife who says that the fever started at about noon today.  He was given a dose of Tylenol then.  However, later in the day, about 4 PM he started having recurrent fever.  She also reports the right lower extremity is more swollen than normal and slightly more red than normal.  This is all occurred over the day today.  Patient denies any pain otherwise.  Just a wound VAC changed yesterday.  Past Medical History:  Diagnosis Date  . CAD (coronary artery disease)   . Cervicalgia   . CHF (congestive heart failure) (HCC)   . COPD (chronic obstructive pulmonary disease) (HCC)   . Diastolic heart failure (HCC)   . Foot drop, right   . History of kidney stones   . Hyperlipidemia    unspecified  . Hypertension   . Myocardial infarction (HCC)   . Osteoarthritis   . Shoulder pain, left   . Sleep apnea   . Tremor, essential     Patient Active Problem List   Diagnosis Date Noted  . Pressure injury of skin 12/02/2017  . Community acquired pneumonia 12/01/2017  . Pneumonia 12/01/2017  . Hypertension 09/23/2017  . Hyperlipidemia 09/23/2017  . Hematoma of leg, right, subsequent encounter 09/22/2017  . Blood blister 09/22/2017  . Weakness 08/31/2017  . Varicose veins of lower extremities with ulcer (HCC) 06/17/2016  . Chronic venous insufficiency 06/17/2016  . Lymphedema 06/17/2016  . Acute on chronic diastolic CHF (congestive heart failure) (HCC) 06/10/2016  . CHF (congestive heart  failure) (HCC) 12/15/2015    Past Surgical History:  Procedure Laterality Date  . APPLICATION OF WOUND VAC Right 11/13/2017   Procedure: APPLICATION OF WOUND VAC;  Surgeon: Annice Needy, MD;  Location: [facility] ORS;  Service: Vascular;  Laterality: Right;  . BACK SURGERY  06/2010  . CARDIAC CATHETERIZATION Left 04/30/2016   Procedure: Left Heart Cath and Coronary Angiography;  Surgeon: Laurier Nancy, MD;  Location: [facility] INVASIVE CV LAB;  Service: Cardiovascular;  Laterality: Left;  . COLONOSCOPY    . CORONARY ANGIOPLASTY    . KNEE ARTHROSCOPY Left   . KNEE SURGERY Left 1998  . TONSILLECTOMY    . WOUND DEBRIDEMENT Right 11/13/2017   Procedure: DEBRIDEMENT WOUND;  Surgeon: Annice Needy, MD;  Location: [facility] ORS;  Service: Vascular;  Laterality: Right;    Prior to Admission medications   Medication Sig Start Date End Date Taking? Authorizing Provider  acetaminophen (TYLENOL) 325 MG tablet Take 2 tablets (650 mg total) by mouth every 6 (six) hours as needed for mild pain or fever. 12/05/17   Alford Highland, MD  aspirin EC 81 MG tablet Take 81 mg by mouth daily.    [provider]  atorvastatin (LIPITOR) 40 MG tablet Take 40 mg by mouth every evening.    [provider]  carvedilol (COREG) 3.125 MG tablet Take 1 tablet (3.125 mg total) by mouth 2 (two) times daily with a meal. Hold for HR less than 55 12/05/17  Alford Highland, MD  Chlorpheniramine Maleate (ALLERGY PO) Take 1 tablet by mouth daily as needed (allergies).    [provider]  docusate sodium (COLACE) 100 MG capsule Take 200 mg by mouth at bedtime.     [provider]  doxycycline (VIBRA-TABS) 100 MG tablet Take 1 tablet (100 mg total) by mouth every 12 (twelve) hours. 12/05/17   Alford Highland, MD  fluticasone (FLONASE) 50 MCG/ACT nasal spray Place 1 spray into both nostrils daily. 11/10/17   [provider]  gabapentin (NEURONTIN) 400 MG capsule Take 400 mg by mouth at bedtime.    [provider]  HYDROcodone-acetaminophen (NORCO/VICODIN) 5-325 MG tablet Take 1 tablet by mouth every 6 (six) hours as needed for moderate pain or severe pain. 12/05/17   Alford Highland, MD  Infant Care Products [facility]) CREA Apply liberal amount topically to area of skin irritation as needed. Ok to leave at bedside.    [provider]  ipratropium-albuterol (DUONEB) 0.5-2.5 (3) MG/3ML SOLN Take 3 mLs by nebulization every 6 (six) hours as needed (shortness of breath).     [provider]  isosorbide mononitrate (IMDUR) 30 MG 24 hr tablet Take 30 mg by mouth daily.  05/31/16   [provider]  lactose free nutrition (BOOST) LIQD Take 237 mLs by mouth daily.    [provider]  levofloxacin (LEVAQUIN) 750 MG tablet Take 1 tablet (750 mg total) by mouth daily. 12/06/17   Alford Highland, MD  lisinopril (PRINIVIL,ZESTRIL) 5 MG tablet Take 5 mg by mouth daily.     [provider]  Multiple Vitamins-Minerals (CENTROVITE) TABS Take 1 tablet by mouth daily.    [provider]  naphazoline-pheniramine (NAPHCON-A) 0.025-0.3 % ophthalmic solution Place 1 drop into both eyes 4 (four) times daily as needed for eye irritation or allergies. 12/05/17   Alford Highland, MD  nitroGLYCERIN (NITROSTAT) 0.3 MG SL tablet Place 0.3 mg under the tongue every 5 (five) minutes as needed for chest pain.    [provider]  Potassium 99 MG TABS Take 99 mg by mouth daily.    [provider]  primidone (MYSOLINE) 50 MG tablet Take 200 mg by mouth 2 (two) times daily. 4 tabs    [provider]  Skin Protectants, Misc. (ENDIT EX) Apply topically to sacrum/buttocks 2 times daily and as needed for irritation    [provider]  spironolactone (ALDACTONE) 25 MG tablet Take 12.5 mg by mouth daily.     [provider]  torsemide (DEMADEX) 20 MG tablet Take 20 mg by mouth daily.     [provider]  umeclidinium-vilanterol  (ANORO ELLIPTA) 62.5-25 MCG/INH AEPB Inhale 1 puff into the lungs daily. 09/04/17   Shaune Pollack, MD    Allergies Patient has no known allergies.  Family History  Problem Relation Age of Onset  . Diabetes Son   . Cancer Mother   . Colon cancer Mother   . Lung cancer Mother   . Tremor Father   . Heart disease Father   . Tremor Brother     Social History Social History   Tobacco Use  . Smoking status: Light Tobacco Smoker    Packs/day: 0.25    Years: 58.00    Pack years: 14.50    Types: Cigarettes  . Smokeless tobacco: Former Neurosurgeon  . Tobacco comment: 1 cigarette per day  Substance Use Topics  . Alcohol use: No    Alcohol/week: 0.0 oz  . Drug use: No  Review of Systems  Constitutional: Fever Eyes: No visual changes. ENT: No sore throat. Cardiovascular: Denies chest pain. Respiratory: Denies shortness of breath. Gastrointestinal: No abdominal pain.  No nausea, no vomiting.  No diarrhea.  No constipation. Genitourinary: Negative for dysuria. Musculoskeletal: Negative for back pain. Skin: Negative for rash. Neurological: Negative for headaches, focal weakness or numbness.   ____________________________________________   PHYSICAL EXAM:  VITAL SIGNS: ED Triage Vitals  Enc Vitals Group     BP 01/10/18 2034 (!) 140/100     Pulse Rate 01/10/18 2034 93     Resp 01/10/18 2034 20     Temp 01/10/18 2034 (!) 103 F (39.4 C)     Temp Source 01/10/18 2034 Oral     SpO2 01/10/18 2034 94 %     Weight 01/10/18 2035 282 lb (127.9 kg)     Height 01/10/18 2035  (1.778 m)     Head Circumference --      Peak Flow --      Pain Score 01/10/18 2035 0     Pain Loc --      Pain Edu? --      Excl. in GC? --     Constitutional: Alert and oriented. in no acute distress. Eyes: Conjunctivae are normal.  Head: Atraumatic. Nose: No congestion/rhinnorhea. Mouth/Throat: Mucous membranes are moist.  Neck: No stridor.   Cardiovascular: Normal rate, regular rhythm. Grossly  normal heart sounds.  Good peripheral circulation with equal and bilateral dorsalis pedis pulses. Respiratory: Tachypneic with decreased lung sounds in the right lower field. Gastrointestinal: Soft and nontender. No distention. No CVA tenderness. Musculoskeletal: Bilateral lower extremity edema.  Moderate to severe edema to the bilateral lower extreme knees.  However, the right side is slightly more edematous with erythema associated.  There is a wound VAC to the lateral and proximal right calf without any tenderness to palpation.  No fluctuance.  No exudate.  The sponge in the wound VAC is approximately 4 x 6 cm. Neurologic:  Normal speech and language. No gross focal neurologic deficits are appreciated. Skin:  Skin is warm, dry and intact. No rash noted. Psychiatric: Mood and affect are normal. Speech and behavior are normal.  ____________________________________________   LABS (all labs ordered are listed, but only abnormal results are displayed)  Labs Reviewed  COMPREHENSIVE METABOLIC PANEL - Abnormal; Notable for the following components:      Result Value   Glucose, Bld 143 (*)    BUN 41 (*)    Creatinine, Ser 1.48 (*)    Albumin 3.2 (*)    AST 69 (*)    ALT 73 (*)    GFR calc non Af Amer 43 (*)    GFR calc Af Amer 50 (*)    All other components within normal limits  CBC WITH DIFFERENTIAL/PLATELET - Abnormal; Notable for the following components:   WBC 18.5 (*)    RBC 3.94 (*)    Hemoglobin 12.5 (*)    HCT 37.4 (*)    RDW 16.5 (*)    Platelets 144 (*)    Neutro Abs 16.7 (*)    All other components within normal limits  URINALYSIS, ROUTINE W REFLEX MICROSCOPIC - Abnormal; Notable for the following components:   Color, Urine YELLOW (*)    APPearance CLEAR (*)    Protein, ur 30 (*)    All other components within normal limits  CULTURE, BLOOD (ROUTINE X 2)  CULTURE, BLOOD (ROUTINE X 2)  LACTIC ACID, PLASMA  LACTIC  ACID, PLASMA    ____________________________________________  EKG  ED ECG REPORT I, Arelia Longest, the attending physician, personally viewed and interpreted this ECG.   Date: 01/10/2018   EKG Time: 2036  Rate: 92  Rhythm: atrial fibrillation, rate 92  Axis: Normal  Intervals:right bundle branch block  ST&T Change: No ST segment elevation or depression.  No abnormal T wave inversion.  ____________________________________________  RADIOLOGY  Possible repeat left lower lobe airspace consolidation. ____________________________________________   PROCEDURES  Procedure(s) performed:   .Critical Care Performed by: Myrna Blazer, MD Authorized by: Myrna Blazer, MD   Critical care provider statement:    Critical care time (minutes):  35   Critical care time was exclusive of:  Separately billable procedures and treating other patients   Critical care was time spent personally by me on the following activities:  Development of treatment plan with patient or surrogate, discussions with consultants, evaluation of patient's response to treatment, examination of patient, obtaining history from patient or surrogate, ordering and performing treatments and interventions, ordering and review of laboratory studies, ordering and review of radiographic studies, pulse oximetry, re-evaluation of patient's condition and review of old charts    Critical Care performed:   ____________________________________________   INITIAL IMPRESSION / ASSESSMENT AND PLAN / ED COURSE  Pertinent labs & imaging results that were available during my care of the patient were reviewed by me and considered in my medical decision making (see chart for details).  DDX: Cellulitis, pneumonia, sepsis, bacteremia, UTI As part of my medical decision making, I reviewed the following data within the electronic MEDICAL RECORD NUMBER Notes from prior ED visits  ----------------------------------------- 9:35 PM  on 01/10/2018 -----------------------------------------  Patient with similar presentation approximately 1 month prior to arrival.  Sepsis alert called.  He will be requiring admission to the hospital for broad-spectrum antibiotics.  Possible repeat cellulitis the right lower extremity with similar appearing x-ray as previously found in April as well.  Holding fluids at this time as the patient is hypertensive.  Lactic acid is not resulted.  History of CHF.  I do not want overload the patient.  Signed out to Dr. Caryn Bee.  ____________________________________________   FINAL CLINICAL IMPRESSION(S) / ED DIAGNOSES  Sepsis.  Right lower extremity cellulitis.    NEW MEDICATIONS STARTED DURING THIS VISIT:  New Prescriptions   No medications on file     Note:  This document was prepared using Dragon voice recognition software and may include unintentional dictation errors.     Myrna Blazer, MD 01/10/18 2136    Myrna Blazer, MD 01/16/18 2005

## 2018-01-10 NOTE — ED Notes (Signed)
Spoke with Karena Addison in lab and she will add on blood test to blood in lab

## 2018-01-10 NOTE — ED Notes (Signed)
Report given to Jennifer RN

## 2018-01-10 NOTE — ED Notes (Signed)
Temp documented twice

## 2018-01-10 NOTE — Progress Notes (Signed)
Pharmacy Antibiotic Note  Dylan [name] is a 82 y.o. male admitted on 01/10/2018 with fever, leukocytosis.  Pharmacy has been consulted for vanc/zosyn dosing.  Plan: Patient received vanc 1g and zosyn 3.375g IV x 1  Will continue vanc 1.25g IV q12h w/ 6 hour stack Will draw vanc trough 05/13 @ 1430 prior to 4th dose. Will continue zosyn 3.375g IV q8h  Ke 0.0488 T1/2 14 ~ 12 hrs Goal trough 15 - 20 mcg/mL  Height:  (177.8 cm) Weight: 282 lb (127.9 kg) IBW/kg (Calculated) : 73  Temp (24hrs), Avg:103 F (39.4 C), Min:103 F (39.4 C), Max:103 F (39.4 C)  Recent Labs  Lab 01/10/18 2039  WBC 18.5*  CREATININE 1.48*  LATICACIDVEN 1.6    Estimated Creatinine Clearance: 53.5 mL/min (A) (by C-G formula based on SCr of 1.48 mg/dL (H)).    No Known Allergies  Thank you for allowing pharmacy to be a part of this patient's care.  Thomasene Ripple, PharmD, BCPS Clinical Pharmacist 01/10/2018

## 2018-01-10 NOTE — ED Triage Notes (Addendum)
Pt comes via [facility] from home with c/o fever. Per EMS family took temp at about 6:30/7pm and it was 99.0 so they gave pt 1000 acetaminophen. EMS checked and got 103.0. Pt is alert and not complaining of any other symptoms. Pt does have wound vac noted to right leg. EMS, 98% room air, BS-157, HR-100.

## 2018-01-10 NOTE — ED Notes (Signed)
Pt cleaned up and repositioned in bed. Pt resting comfortably at this time. Family at bedside

## 2018-01-11 LAB — CBC
HCT: 34.8 % — ABNORMAL LOW (ref 40.0–52.0)
HEMOGLOBIN: 11.6 g/dL — AB (ref 13.0–18.0)
MCH: 31.6 pg (ref 26.0–34.0)
MCHC: 33.4 g/dL (ref 32.0–36.0)
MCV: 94.6 fL (ref 80.0–100.0)
PLATELETS: 133 10*3/uL — AB (ref 150–440)
RBC: 3.67 MIL/uL — AB (ref 4.40–5.90)
RDW: 16.1 % — AB (ref 11.5–14.5)
WBC: 21.5 10*3/uL — AB (ref 3.8–10.6)

## 2018-01-11 LAB — LACTIC ACID, PLASMA: LACTIC ACID, VENOUS: 1.6 mmol/L (ref 0.5–1.9)

## 2018-01-11 LAB — BASIC METABOLIC PANEL
ANION GAP: 7 (ref 5–15)
BUN: 46 mg/dL — ABNORMAL HIGH (ref 6–20)
CHLORIDE: 105 mmol/L (ref 101–111)
CO2: 28 mmol/L (ref 22–32)
CREATININE: 1.55 mg/dL — AB (ref 0.61–1.24)
Calcium: 8.3 mg/dL — ABNORMAL LOW (ref 8.9–10.3)
GFR calc non Af Amer: 41 mL/min — ABNORMAL LOW (ref >60)
GFR, EST AFRICAN AMERICAN: 47 mL/min — AB (ref >60)
Glucose, Bld: 122 mg/dL — ABNORMAL HIGH (ref 65–99)
Potassium: 3.3 mmol/L — ABNORMAL LOW (ref 3.5–5.1)
SODIUM: 140 mmol/L (ref 135–145)

## 2018-01-11 LAB — GLUCOSE, CAPILLARY: Glucose-Capillary: 107 mg/dL — ABNORMAL HIGH (ref 65–99)

## 2018-01-11 MED ORDER — BISACODYL 5 MG PO TBEC: 5.0000 mg | DELAYED_RELEASE_TABLET | Freq: Every day | ORAL | Status: DC | PRN

## 2018-01-11 MED ORDER — FLUTICASONE PROPIONATE 50 MCG/ACT NA SUSP
1.0000 | Freq: Every day | NASAL | Status: DC
  Administered 2018-01-11 – 2018-01-14 (×4): 1 via NASAL
  Filled 2018-01-11: qty 16

## 2018-01-11 MED ORDER — PRIMIDONE 50 MG PO TABS
200.0000 mg | ORAL_TABLET | Freq: Two times a day (BID) | ORAL | Status: DC
  Administered 2018-01-11 – 2018-01-14 (×7): 200 mg via ORAL
  Filled 2018-01-11 (×9): qty 4

## 2018-01-11 MED ORDER — LACTATED RINGERS IV BOLUS
1000.0000 mL | Freq: Once | INTRAVENOUS | Status: AC
  Administered 2018-01-11: 1000 mL via INTRAVENOUS

## 2018-01-11 MED ORDER — GABAPENTIN 400 MG PO CAPS
400.0000 mg | ORAL_CAPSULE | Freq: Every day | ORAL | Status: DC
  Administered 2018-01-11 – 2018-01-13 (×4): 400 mg via ORAL
  Filled 2018-01-11 (×4): qty 1

## 2018-01-11 MED ORDER — SODIUM CHLORIDE 0.9 % IV SOLN
Freq: Once | INTRAVENOUS | Status: AC
  Administered 2018-01-11: 02:00:00 via INTRAVENOUS

## 2018-01-11 MED ORDER — GABAPENTIN 300 MG PO CAPS
300.0000 mg | ORAL_CAPSULE | ORAL | Status: DC
  Administered 2018-01-11 – 2018-01-14 (×4): 300 mg via ORAL
  Filled 2018-01-11 (×4): qty 1

## 2018-01-11 MED ORDER — ASPIRIN EC 81 MG PO TBEC
81.0000 mg | DELAYED_RELEASE_TABLET | Freq: Every day | ORAL | Status: DC
Start: 2018-01-11 — End: 2018-01-14
  Administered 2018-01-11 – 2018-01-14 (×4): 81 mg via ORAL
  Filled 2018-01-11 (×4): qty 1

## 2018-01-11 MED ORDER — UMECLIDINIUM-VILANTEROL 62.5-25 MCG/INH IN AEPB
1.0000 | INHALATION_SPRAY | Freq: Every day | RESPIRATORY_TRACT | Status: DC
  Administered 2018-01-11 – 2018-01-14 (×4): 1 via RESPIRATORY_TRACT
  Filled 2018-01-11: qty 14

## 2018-01-11 MED ORDER — ISOSORBIDE MONONITRATE ER 30 MG PO TB24
30.0000 mg | ORAL_TABLET | Freq: Every day | ORAL | Status: DC
  Administered 2018-01-12 – 2018-01-14 (×3): 30 mg via ORAL
  Filled 2018-01-11 (×3): qty 1

## 2018-01-11 MED ORDER — CHLORPHENIRAMINE MALEATE 4 MG PO TABS: 4.0000 mg | ORAL_TABLET | Freq: Every day | ORAL | Status: DC | PRN

## 2018-01-11 MED ORDER — NAPHAZOLINE-PHENIRAMINE 0.025-0.3 % OP SOLN
1.0000 [drp] | Freq: Four times a day (QID) | OPHTHALMIC | Status: DC | PRN
  Filled 2018-01-11: qty 5

## 2018-01-11 MED ORDER — ACETAMINOPHEN 650 MG RE SUPP: 650.0000 mg | Freq: Four times a day (QID) | RECTAL | Status: DC | PRN

## 2018-01-11 MED ORDER — TORSEMIDE 20 MG PO TABS
20.0000 mg | ORAL_TABLET | Freq: Every day | ORAL | Status: DC
  Filled 2018-01-11: qty 1

## 2018-01-11 MED ORDER — POTASSIUM 99 MG PO TABS: 99.0000 mg | ORAL_TABLET | Freq: Every day | ORAL | Status: DC

## 2018-01-11 MED ORDER — POTASSIUM CHLORIDE CRYS ER 10 MEQ PO TBCR
10.0000 meq | EXTENDED_RELEASE_TABLET | Freq: Every day | ORAL | Status: DC
  Administered 2018-01-11 – 2018-01-14 (×4): 10 meq via ORAL
  Filled 2018-01-11 (×4): qty 1

## 2018-01-11 MED ORDER — HYDROCODONE-ACETAMINOPHEN 5-325 MG PO TABS: 1.0000 | ORAL_TABLET | ORAL | Status: DC | PRN

## 2018-01-11 MED ORDER — ONDANSETRON HCL 4 MG/2ML IJ SOLN: 4.0000 mg | Freq: Four times a day (QID) | INTRAMUSCULAR | Status: DC | PRN

## 2018-01-11 MED ORDER — ACETAMINOPHEN 325 MG PO TABS
650.0000 mg | ORAL_TABLET | Freq: Four times a day (QID) | ORAL | Status: DC | PRN
  Administered 2018-01-11 (×2): 650 mg via ORAL
  Filled 2018-01-11 (×2): qty 2

## 2018-01-11 MED ORDER — ATORVASTATIN CALCIUM 20 MG PO TABS
40.0000 mg | ORAL_TABLET | Freq: Every evening | ORAL | Status: DC
  Administered 2018-01-11 – 2018-01-13 (×3): 40 mg via ORAL
  Filled 2018-01-11 (×3): qty 2

## 2018-01-11 MED ORDER — LISINOPRIL 10 MG PO TABS: 5.0000 mg | ORAL_TABLET | Freq: Every day | ORAL | Status: DC

## 2018-01-11 MED ORDER — TRAZODONE HCL 50 MG PO TABS: 25.0000 mg | ORAL_TABLET | Freq: Every evening | ORAL | Status: DC | PRN

## 2018-01-11 MED ORDER — IPRATROPIUM-ALBUTEROL 0.5-2.5 (3) MG/3ML IN SOLN
3.0000 mL | Freq: Four times a day (QID) | RESPIRATORY_TRACT | Status: DC
  Administered 2018-01-11 (×4): 3 mL via RESPIRATORY_TRACT
  Filled 2018-01-11 (×4): qty 3

## 2018-01-11 MED ORDER — DOCUSATE SODIUM 100 MG PO CAPS
100.0000 mg | ORAL_CAPSULE | Freq: Two times a day (BID) | ORAL | Status: DC
  Administered 2018-01-11 – 2018-01-14 (×7): 100 mg via ORAL
  Filled 2018-01-11 (×7): qty 1

## 2018-01-11 MED ORDER — NITROGLYCERIN 0.4 MG SL SUBL: 0.4000 mg | SUBLINGUAL_TABLET | SUBLINGUAL | Status: DC | PRN

## 2018-01-11 MED ORDER — HEPARIN SODIUM (PORCINE) 5000 UNIT/ML IJ SOLN
5000.0000 [IU] | Freq: Three times a day (TID) | INTRAMUSCULAR | Status: DC
  Administered 2018-01-11 – 2018-01-14 (×10): 5000 [IU] via SUBCUTANEOUS
  Filled 2018-01-11 (×10): qty 1

## 2018-01-11 MED ORDER — CARVEDILOL 6.25 MG PO TABS: 3.1250 mg | ORAL_TABLET | Freq: Two times a day (BID) | ORAL | Status: DC

## 2018-01-11 MED ORDER — IPRATROPIUM-ALBUTEROL 0.5-2.5 (3) MG/3ML IN SOLN
3.0000 mL | Freq: Three times a day (TID) | RESPIRATORY_TRACT | Status: DC
  Administered 2018-01-12 – 2018-01-13 (×4): 3 mL via RESPIRATORY_TRACT
  Filled 2018-01-11 (×4): qty 3

## 2018-01-11 MED ORDER — SPIRONOLACTONE 25 MG PO TABS
12.5000 mg | ORAL_TABLET | Freq: Every day | ORAL | Status: DC
Start: 2018-01-11 — End: 2018-01-11
  Filled 2018-01-11: qty 0.5

## 2018-01-11 MED ORDER — ADULT MULTIVITAMIN W/MINERALS CH
1.0000 | ORAL_TABLET | Freq: Every day | ORAL | Status: DC
  Administered 2018-01-11 – 2018-01-14 (×4): 1 via ORAL
  Filled 2018-01-11 (×4): qty 1

## 2018-01-11 MED ORDER — ONDANSETRON HCL 4 MG PO TABS: 4.0000 mg | ORAL_TABLET | Freq: Four times a day (QID) | ORAL | Status: DC | PRN

## 2018-01-11 MED ORDER — LORATADINE 10 MG PO TABS
10.0000 mg | ORAL_TABLET | Freq: Every day | ORAL | Status: DC
  Administered 2018-01-11 – 2018-01-13 (×4): 10 mg via ORAL
  Filled 2018-01-11 (×4): qty 1

## 2018-01-11 MED ORDER — ENSURE ENLIVE PO LIQD
237.0000 mL | Freq: Every day | ORAL | Status: DC
  Administered 2018-01-11 – 2018-01-14 (×4): 237 mL via ORAL

## 2018-01-11 NOTE — Progress Notes (Signed)
Dr. Renae Gloss notified of patient BP 87/40. Patient has a history CHF and Diastolic Heart Failure. Per Dr. Renae Gloss, it is okay for RN to give LR 1 liter bolus. Will continue to monitor.

## 2018-01-11 NOTE — Progress Notes (Signed)
Patient ID: Dylan [name], male   DOB: May 16, 1936, 82 y.o.   MRN: [id number]  ACP note  Patient unable to participate in conversation.  Wife and son at the bedside.  Diagnosis sepsis secondary to right lower extremity cellulitis, hypotension, acute encephalopathy, gradual decline over time.  Inability to walk.  History of diastolic congestive heart failure.  Wife answer the question that he would like to be a full code.  I mentioned that people that gradually decline over time do have a poor prognosis especially if we do not get up and walk.  Time spent on ACP discussion 17 minutes  Dr. Alford Highland

## 2018-01-11 NOTE — Progress Notes (Signed)
Patient ID: AMIR FICK, male   DOB: [birth year], 82 y.o.   MRN: [id number]  [employer] PROGRESS NOTE  HAYDON DORRIS WGN:[id number] DOB: Feb 04, 1936 DOA: 01/10/2018 PCP: [name], MD  HPI/Subjective: Patient seen earlier today.  Was sleeping through most of the talk.  Family states that he had chills and urinary frequency and a fever and some altered mental status.  Patient was admitted with sepsis.  Objective: Vitals:   01/11/18 0805 01/11/18 1345  BP:  (!) 111/58  Pulse:  68  Resp:    Temp:  99.4 F (37.4 C)  SpO2: 94%     Filed Weights   01/10/18 2035 01/11/18 0024 01/11/18 0500  Weight: 127.9 kg (282 lb) 131.9 kg (290 lb 12.6 oz) 133.7 kg (294 lb 12.1 oz)    ROS: Review of Systems  Unable to perform ROS: Acuity of condition   Exam: Physical Exam  Constitutional: He appears lethargic.  HENT:  Nose: No mucosal edema.  Mouth/Throat: No oropharyngeal exudate or posterior oropharyngeal edema.  Eyes: Pupils are equal, round, and reactive to light. Conjunctivae, EOM and lids are normal.  Neck: No JVD present. Carotid bruit is not present. No edema present. No thyroid mass and no thyromegaly present.  Cardiovascular: S1 normal and S2 normal. Exam reveals no gallop.  No murmur heard. Pulses:      Dorsalis pedis pulses are 2+ on the right side, and 2+ on the left side.  Respiratory: No respiratory distress. He has decreased breath sounds in the right lower field and the left lower field. He has no wheezes. He has no rhonchi. He has no rales.  GI: Soft. Bowel sounds are normal. There is no tenderness.  Musculoskeletal:       Right ankle: He exhibits swelling.       Left ankle: He exhibits swelling.  Lymphadenopathy:    He has no cervical adenopathy.  Neurological: He appears lethargic.  Skin: Skin is warm. Nails show no clubbing.  Erythema right leg, wound VAC right leg, chronic lower extremity discoloration bilaterally  Psychiatric:  Patient lethargic but did  answer few questions      Data Reviewed: Basic Metabolic Panel: Recent Labs  Lab 01/10/18 2039 01/11/18 0030  NA 141 140  K 3.7 3.3*  CL 103 105  CO2 30 28  GLUCOSE 143* 122*  BUN 41* 46*  CREATININE 1.48* 1.55*  CALCIUM 8.9 8.3*   Liver Function Tests: Recent Labs  Lab 01/10/18 2039  AST 69*  ALT 73*  ALKPHOS 109  BILITOT 0.6  PROT 6.6  ALBUMIN 3.2*   CBC: Recent Labs  Lab 01/10/18 2039 01/11/18 0030  WBC 18.5* 21.5*  NEUTROABS 16.7*  --   HGB 12.5* 11.6*  HCT 37.4* 34.8*  MCV 94.9 94.6  PLT 144* 133*    CBG: Recent Labs  Lab 01/11/18 0747  GLUCAP 107*    Recent Results (from the past 240 hour(s))  Blood Culture (routine x 2)     Status: None (Preliminary result)   Collection Time: 01/10/18  8:39 PM  Result Value Ref Range Status   Specimen Description BLOOD LEFT FOREARM  Final   Special Requests   Final    BOTTLES DRAWN AEROBIC AND ANAEROBIC Blood Culture results may not be optimal due to an excessive volume of blood received in culture bottles   Culture   Final    NO GROWTH < 12 HOURS Performed at [facility], [address], [place], [place] [address]  Report Status PENDING  Incomplete  Blood Culture (routine x 2)     Status: None (Preliminary result)   Collection Time: 01/10/18  8:42 PM  Result Value Ref Range Status   Specimen Description BLOOD LEFT FOREARM MEDIAL  Final   Special Requests   Final    BOTTLES DRAWN AEROBIC AND ANAEROBIC Blood Culture results may not be optimal due to an excessive volume of blood received in culture bottles   Culture   Final    NO GROWTH < 12 HOURS Performed at [facility], [address] [place], [place] [address]    Report Status PENDING  Incomplete     Studies: Dg Chest Port 1 View  Result Date: 01/10/2018 CLINICAL DATA:  Cough and fever. EXAM: PORTABLE CHEST 1 VIEW COMPARISON:  12/01/2017 and prior chest radiograph FINDINGS: UPPER limits normal heart size again noted.  Mediastinal silhouette is unchanged. Minimal LEFT basilar atelectasis/scarring again noted. There is no evidence of focal airspace disease, pulmonary edema, suspicious pulmonary nodule/mass, pleural effusion, or pneumothorax. No acute bony abnormalities are identified. IMPRESSION: No evidence of acute cardiopulmonary disease. Electronically Signed   By: Harmon Pier M.D.   On: 01/10/2018 21:34    Scheduled Meds: . aspirin EC  81 mg Oral Daily  . atorvastatin  40 mg Oral QPM  . docusate sodium  100 mg Oral BID  . feeding supplement (ENSURE ENLIVE)  237 mL Oral Daily  . fluticasone  1 spray Each Nare Daily  . gabapentin  300 mg Oral BH-q7a  . gabapentin  400 mg Oral QHS  . heparin  5,000 Units Subcutaneous Q8H  . ipratropium-albuterol  3 mL Nebulization Q6H  . isosorbide mononitrate  30 mg Oral Daily  . loratadine  10 mg Oral QHS  . multivitamin with minerals  1 tablet Oral Daily  . potassium chloride  10 mEq Oral Daily  . primidone  200 mg Oral BID  . umeclidinium-vilanterol  1 puff Inhalation Daily   Continuous Infusions: . piperacillin-tazobactam (ZOSYN)  IV 3.375 g (01/11/18 1406)  . vancomycin Stopped (01/11/18 7829)    Assessment/Plan:  1. Clinical sepsis likely cellulitis of the right lower extremity, leukocytosis, fever and hypotension.  Patient also has acute encephalopathy.  IV fluid hydration and boluses.  Hold antihypertensive medications at this time.  Aggressive antibiotics with vancomycin and Zosyn.  So far blood cultures are negative.  Urine analysis is negative. 2. Right lower extremity chronic venous ulcer with wound VAC 3. Chronic kidney disease stage III. 4. Morbid obesity and sleep apnea on CPAP at night 5. Chronic diastolic congestive heart failure no signs at this point  6. hyperlipidemia unspecified on atorvastatin 7. Neuropathy on gabapentin 8. Weakness Physical therapy evaluation  Code Status:     Code Status Orders  (From admission, onward)         Start     Ordered   01/11/18 0013  Full code  Continuous     01/11/18 0012    Code Status History    Date Active Date Inactive Code Status Order ID Comments User Context   12/01/2017 1924 12/05/2017 1948 Full Code [id number]  Altamese Dilling, MD Inpatient   08/31/2017 0557 09/05/2017 1959 Full Code [id number]  Arnaldo Natal, MD Inpatient   06/10/2016 1934 06/13/2016 1816 Full Code [id number]  Shaune Pollack, MD Inpatient   12/15/2015 0134 12/16/2015 1507 Full Code [id number]  Ihor Austin, MD ED    Advance Directive Documentation     Most Recent Value  Type of Advance Directive  Living will  Pre-existing out of facility DNR order (yellow form or pink MOST form)  -  "MOST" Form in Place?  -     Family Communication: Family at bedside  disposition Plan: Likely will need a few days of antibiotics  Antibiotics:  Vancomycin  Zosyn  Time spent: 28 minutes   Al Gagen [employer]

## 2018-01-12 DIAGNOSIS — S81801A Unspecified open wound, right lower leg, initial encounter: Secondary | ICD-10-CM

## 2018-01-12 DIAGNOSIS — A419 Sepsis, unspecified organism: Secondary | ICD-10-CM

## 2018-01-12 LAB — BASIC METABOLIC PANEL
Anion gap: 8 (ref 5–15)
BUN: 43 mg/dL — AB (ref 6–20)
CALCIUM: 8.1 mg/dL — AB (ref 8.9–10.3)
CO2: 30 mmol/L (ref 22–32)
CREATININE: 1.65 mg/dL — AB (ref 0.61–1.24)
Chloride: 100 mmol/L — ABNORMAL LOW (ref 101–111)
GFR calc Af Amer: 44 mL/min — ABNORMAL LOW (ref >60)
GFR, EST NON AFRICAN AMERICAN: 38 mL/min — AB (ref >60)
GLUCOSE: 100 mg/dL — AB (ref 65–99)
Potassium: 3.7 mmol/L (ref 3.5–5.1)
SODIUM: 138 mmol/L (ref 135–145)

## 2018-01-12 LAB — BLOOD CULTURE ID PANEL (REFLEXED)
ACINETOBACTER BAUMANNII: NOT DETECTED
CANDIDA GLABRATA: NOT DETECTED
CANDIDA TROPICALIS: NOT DETECTED
Candida albicans: NOT DETECTED
Candida krusei: NOT DETECTED
Candida parapsilosis: NOT DETECTED
ENTEROBACTERIACEAE SPECIES: NOT DETECTED
Enterobacter cloacae complex: NOT DETECTED
Enterococcus species: NOT DETECTED
Escherichia coli: NOT DETECTED
Haemophilus influenzae: NOT DETECTED
KLEBSIELLA PNEUMONIAE: NOT DETECTED
Klebsiella oxytoca: NOT DETECTED
Listeria monocytogenes: NOT DETECTED
NEISSERIA MENINGITIDIS: NOT DETECTED
PSEUDOMONAS AERUGINOSA: NOT DETECTED
Proteus species: NOT DETECTED
STREPTOCOCCUS PNEUMONIAE: NOT DETECTED
STREPTOCOCCUS PYOGENES: NOT DETECTED
Serratia marcescens: NOT DETECTED
Staphylococcus aureus (BCID): NOT DETECTED
Staphylococcus species: NOT DETECTED
Streptococcus agalactiae: NOT DETECTED
Streptococcus species: NOT DETECTED

## 2018-01-12 LAB — CBC
HEMATOCRIT: 36.1 % — AB (ref 40.0–52.0)
Hemoglobin: 12.1 g/dL — ABNORMAL LOW (ref 13.0–18.0)
MCH: 31.8 pg (ref 26.0–34.0)
MCHC: 33.5 g/dL (ref 32.0–36.0)
MCV: 94.9 fL (ref 80.0–100.0)
PLATELETS: 128 10*3/uL — AB (ref 150–440)
RBC: 3.8 MIL/uL — ABNORMAL LOW (ref 4.40–5.90)
RDW: 16.9 % — AB (ref 11.5–14.5)
WBC: 9.5 10*3/uL (ref 3.8–10.6)

## 2018-01-12 LAB — GLUCOSE, CAPILLARY
Glucose-Capillary: 78 mg/dL (ref 65–99)
Glucose-Capillary: 117 mg/dL — ABNORMAL HIGH (ref 65–99)

## 2018-01-12 LAB — VANCOMYCIN, TROUGH: Vancomycin Tr: 22 ug/mL (ref 15–20)

## 2018-01-12 LAB — SEDIMENTATION RATE: SED RATE: 52 mm/h — AB (ref 0–20)

## 2018-01-12 MED ORDER — GERHARDT'S BUTT CREAM
TOPICAL_CREAM | Freq: Three times a day (TID) | CUTANEOUS | Status: DC
  Administered 2018-01-12 – 2018-01-14 (×6): via TOPICAL
  Filled 2018-01-12 (×3): qty 1

## 2018-01-12 MED ORDER — VANCOMYCIN HCL IN DEXTROSE 1-5 GM/200ML-% IV SOLN
1000.0000 mg | Freq: Two times a day (BID) | INTRAVENOUS | Status: DC
  Administered 2018-01-12 – 2018-01-13 (×2): 1000 mg via INTRAVENOUS
  Filled 2018-01-12 (×3): qty 200

## 2018-01-12 NOTE — Consult Note (Signed)
WOC Nurse wound consult note Reason for Consult:Chronic, healing wound on right LE with NPWT.  Patient is managed by Dr. Wyn Quaker.  Patient and sister state that Dr. Wyn Quaker told them that the V.A.C. Could not be changed just twice weekly, on Mondays and Fridays. Wound type: Full thickness Pressure Injury POA: NA Measurement: Right LE full thickness wound with NPWT:  2.6cm x 2cm x 0.4cm.  Red moist wound bed.  Small amount serous drainage. No odor. Bilateral buttocks partial thickness tissue loss secondary to friction and moisture.  Larger of the two areas is on the right and measures 2cm x 1cm x 0.1cm. Pink moist wound bed, scant serous drainage. Right great toe: circular area of dried serum (scab) measuring 0.4cm round. No drainage. Wound bed:As described above Drainage (amount, consistency, odor) As described above Periwound: As described above Dressing procedure/placement/frequency: I will use a zinc oxide cream to the buttocks, float heels and pain the right great toe dried serum with a betadine swabstick.  NPWT dressing was changed today, wound is clean and granulating, but still has depth. Will change twice weekly as patient and his sister are both emphatic that that is what Dr. Wyn Quaker told them was to be the new frequency. As I do not disagree, that is our POC.  WOC nursing team will not follow, but will remain available to this patient, the nursing and medical teams.  Please re-consult if needed. Thanks, Ladona Mow, MSN, RN, GNP, [place]  Pager# [phone]

## 2018-01-12 NOTE — Evaluation (Signed)
Physical Therapy Evaluation Patient Details Name: Dylan [name] MRN: [id number] DOB: 26-Feb-1936 Today's Date: 01/12/2018   History of Present Illness  Pt is a 82 y/o M who presented with SOB and lethargic with RLE swelling and rednes .  Pt admitted with sepsis likely cellulitis of RLE, leuikocytosis, fever, and hypotension.  Pt with acute encephalopathy as well.  Pt's PMH includes CHF, COPD, R foot drop, MI, L shoulder pain, cervicalgia.    Clinical Impression  Pt admitted with above diagnosis. Pt currently with functional limitations due to the deficits listed below (see PT Problem List). Dylan [name] presents at his baseline level of mobility.  At baseline the pt performs stand pivot transfers to his WC using his RW.  Pt is then able to self propel his manual WC without assist.  His wife assists him with sponge bathing, dressing.  Upon evaluation today the pt currently requires min assist for supine>sit (pt sleeps in lift chair at home), and min guard for sit<>stand and stand pivot transfers.  Pt ambulated 7 ft with RW with slower but steady gait. Pt will benefit from skilled PT to increase their independence and safety with mobility to allow discharge to the venue listed below.      Follow Up Recommendations Home health PT;Supervision for mobility/OOB    Equipment Recommendations  None recommended by PT    Recommendations for Other Services       Precautions / Restrictions Precautions Precautions: Fall Precaution Comments: wound vac RLE Restrictions Weight Bearing Restrictions: No      Mobility  Bed Mobility Overal bed mobility: Needs Assistance Bed Mobility: Supine to Sit     Supine to sit: Min assist;HOB elevated     General bed mobility comments: Increased time and effort and assist to elevate trunk.  Pt uses bed rail for assist.   Transfers Overall transfer level: Needs assistance Equipment used: Rolling walker (2 wheeled) Transfers: Sit to/from [employer] Sit to Stand: Min guard;From elevated surface Stand pivot transfers: Min guard       General transfer comment: Elevated bed to height of lift chair at home.  Close min guard assist as pt rises slowly.  Well controlled descent to sit.  Pt slow but steady with stand pivot transfer.   Ambulation/Gait Ambulation/Gait assistance: Min guard Ambulation Distance (Feet): 7 Feet Assistive device: Rolling walker (2 wheeled) Gait Pattern/deviations: Decreased step length - right;Decreased step length - left;Trunk flexed;Wide base of support Gait velocity: decreased   General Gait Details: Pt ambulates with wide BOS and takes short steps with slightly flexed posture.   Stairs            Wheelchair Mobility    Modified Rankin (Stroke Patients Only)       Balance Overall balance assessment: Needs assistance;History of Falls Sitting-balance support: No upper extremity supported;Feet supported Sitting balance-Leahy Scale: Fair     Standing balance support: Bilateral upper extremity supported;During functional activity Standing balance-Leahy Scale: Poor Standing balance comment: Pt relies on UE support for static and dynamic activities                             Pertinent Vitals/Pain Pain Assessment: No/denies pain    Home Living Family/patient expects to be discharged to:: Private residence Living Arrangements: Spouse/significant other Available Help at Discharge: Family;Available 24 hours/day;Personal care attendant Type of Home: House Home Access: Ramped entrance     Home Layout: One level Home Equipment:  Walker - 2 wheels;Other (comment);Wheelchair - manual;Bedside commode(lift chair) Additional Comments: Aide 2 days/wk who assists with sponge bath    Prior Function Level of Independence: Needs assistance   Gait / Transfers Assistance Needed: Pt uses RW to perform stand pivot transfer. Has not ambulated more than 3 ft since January 2019. He has had  "a few" falls in the past 6 months.  Pt sleeps in lift chair.   ADL's / Homemaking Assistance Needed: Wife assists with dressing, sponge bathing.  Wife does the cooking, cleaning.          Hand Dominance   Dominant Hand: Right    Extremity/Trunk Assessment   Upper Extremity Assessment Upper Extremity Assessment: LUE deficits/detail LUE Deficits / Details: h/o L RTC injury, limited strength and ROM    Lower Extremity Assessment Lower Extremity Assessment: RLE deficits/detail;LLE deficits/detail RLE Deficits / Details: H/o R foot drop (pt reports from back surgery).  Otherwise, strength grossly 3/5 LLE Deficits / Details: Strength grossly 3+/5       Communication   Communication: No difficulties  Cognition Arousal/Alertness: Awake/alert Behavior During Therapy: WFL for tasks assessed/performed Overall Cognitive Status: Within Functional Limits for tasks assessed                                        General Comments      Exercises General Exercises - Lower Extremity Ankle Circles/Pumps: AROM;Left;10 reps;Supine   Assessment/Plan    PT Assessment Patient needs continued PT services  PT Problem List Decreased strength;Decreased activity tolerance;Decreased balance;Decreased mobility       PT Treatment Interventions DME instruction;Gait training;Functional mobility training;Therapeutic activities;Therapeutic exercise;Patient/family education;Neuromuscular re-education;Balance training;Wheelchair mobility training    PT Goals (Current goals can be found in the Care Plan section)  Acute Rehab PT Goals Patient Stated Goal: to return home and improve strength and independence PT Goal Formulation: With patient Time For Goal Achievement: 01/26/18 Potential to Achieve Goals: Good    Frequency Min 2X/week   Barriers to discharge        Co-evaluation               AM-PAC PT "6 Clicks" Daily Activity  Outcome Measure Difficulty turning over in  bed (including adjusting bedclothes, sheets and blankets)?: Unable Difficulty moving from lying on back to sitting on the side of the bed? : Unable Difficulty sitting down on and standing up from a chair with arms (e.g., wheelchair, bedside commode, etc,.)?: A Lot Help needed moving to and from a bed to chair (including a wheelchair)?: A Little Help needed walking in hospital room?: A Little Help needed climbing 3-5 steps with a railing? : Total 6 Click Score: 11    End of Session Equipment Utilized During Treatment: Gait belt Activity Tolerance: Patient tolerated treatment well;Patient limited by fatigue Patient left: in chair;with call bell/phone within reach;with chair alarm set Nurse Communication: Mobility status PT Visit Diagnosis: Muscle weakness (generalized) (M62.81);History of falling (Z91.81);Unsteadiness on feet (R26.81);Difficulty in walking, not elsewhere classified (R26.2)    Time: 1610-9604 PT Time Calculation (min) (ACUTE ONLY): 26 min   Charges:   PT Evaluation $PT Eval Low Complexity: 1 Low PT Treatments $Therapeutic Activity: 8-22 mins   PT G Codes:        Encarnacion Chu PT, DPT 01/12/2018, 9:32 AM

## 2018-01-12 NOTE — Consult Note (Signed)
[facility] Vascular Consult Note  MRN : [id number]  Dylan [name] is a 82 y.o. ([birth year]) male who presents with chief complaint of  Chief Complaint  Patient presents with  . Fever  .  History of Present Illness: I am asked to see the patient by Dr. Renae Gloss for evaluation of right leg wound.  He was admitted with sepsis from presumed right lower extremity wound or cellulitis.  1 of his blood cultures shows gram-positive rods.  He has had a chronic right leg wound for months now.  This has been gradually decreasing in size and has been getting negative pressure dressings for many weeks now.  His leg is actually less swollen today than most of the times I have seen it.  There is no erythema.  There is no drainage.  His VAC sponge was changed earlier today and by report was the size of a silver dollar with granulation tissue which was good.  When admitted, he had a markedly elevated white blood cell count and low blood pressure.  That has since resolved.  He feels reasonably well today.  Current Facility-Administered Medications  Medication Dose Route Frequency Provider Last Rate Last Dose  . acetaminophen (TYLENOL) tablet 650 mg  650 mg Oral Q6H PRN Cammy Copa, MD   650 mg at 01/11/18 2116   Or  . acetaminophen (TYLENOL) suppository 650 mg  650 mg Rectal Q6H PRN Cammy Copa, MD      . aspirin EC tablet 81 mg  81 mg Oral Daily Cammy Copa, MD   81 mg at 01/12/18 1130  . atorvastatin (LIPITOR) tablet 40 mg  40 mg Oral QPM Cammy Copa, MD   40 mg at 01/11/18 1805  . bisacodyl (DULCOLAX) EC tablet 5 mg  5 mg Oral Daily PRN Cammy Copa, MD      . docusate sodium (COLACE) capsule 100 mg  100 mg Oral BID Cammy Copa, MD   100 mg at 01/11/18 2115  . feeding supplement (ENSURE ENLIVE) (ENSURE ENLIVE) liquid 237 mL  237 mL Oral Daily Cammy Copa, MD   237 mL at 01/12/18 1133  . fluticasone (FLONASE) 50 MCG/ACT nasal spray 1 spray  1 spray Each Nare Daily Cammy Copa, MD   1 spray at 01/12/18 1131  . gabapentin (NEURONTIN) capsule 300 mg  300 mg Oral Mylo Red, MD   300 mg at 01/12/18 0606  . gabapentin (NEURONTIN) capsule 400 mg  400 mg Oral QHS Cammy Copa, MD   400 mg at 01/11/18 2115  . Gerhardt's butt cream   Topical TID Alford Highland, MD      . heparin injection 5,000 Units  5,000 Units Subcutaneous Q8H Cammy Copa, MD   5,000 Units at 01/12/18 1413  . HYDROcodone-acetaminophen (NORCO/VICODIN) 5-325 MG per tablet 1-2 tablet  1-2 tablet Oral Q4H PRN Cammy Copa, MD      . ipratropium-albuterol (DUONEB) 0.5-2.5 (3) MG/3ML nebulizer solution 3 mL  3 mL Nebulization TID Cammy Copa, MD   3 mL at 01/12/18 1409  . isosorbide mononitrate (IMDUR) 24 hr tablet 30 mg  30 mg Oral Daily Cammy Copa, MD   30 mg at 01/12/18 1133  . loratadine (CLARITIN) tablet 10 mg  10 mg Oral QHS Cammy Copa, MD   10 mg at 01/11/18 2115  . multivitamin with minerals tablet 1 tablet  1 tablet Oral Daily Cammy Copa, MD   1 tablet at 01/12/18 1129  . naphazoline-pheniramine (NAPHCON-A) 0.025-0.3 %  ophthalmic solution 1 drop  1 drop Both Eyes QID PRN Cammy Copa, MD      . nitroGLYCERIN (NITROSTAT) SL tablet 0.4 mg  0.4 mg Sublingual Q5 min PRN Cammy Copa, MD      . ondansetron [facility]) tablet 4 mg  4 mg Oral Q6H PRN Cammy Copa, MD       Or  . ondansetron [facility]) injection 4 mg  4 mg Intravenous Q6H PRN Cammy Copa, MD      . piperacillin-tazobactam (ZOSYN) IVPB 3.375 g  3.375 g Intravenous Roxanna Mew, MD 12.5 mL/hr at 01/12/18 1413 3.375 g at 01/12/18 1413  . potassium chloride (K-DUR,KLOR-CON) CR tablet 10 mEq  10 mEq Oral Daily Cammy Copa, MD   10 mEq at 01/12/18 1130  . primidone (MYSOLINE) tablet 200 mg  200 mg Oral BID Cammy Copa, MD   200 mg at 01/12/18 1132  . traZODone (DESYREL) tablet 25 mg  25 mg Oral QHS PRN Cammy Copa, MD      . umeclidinium-vilanterol [facility] ELLIPTA) 62.5-25 MCG/INH 1 puff  1 puff Inhalation  Daily Cammy Copa, MD   1 puff at 01/12/18 1130  . vancomycin (VANCOCIN) 1,250 mg in sodium chloride 0.9 % 250 mL IVPB  1,250 mg Intravenous Q12H Cammy Copa, MD   Stopped at 01/12/18 1610    Past Medical History:  Diagnosis Date  . CAD (coronary artery disease)   . Cervicalgia   . CHF (congestive heart failure) (HCC)   . COPD (chronic obstructive pulmonary disease) (HCC)   . Diastolic heart failure (HCC)   . Foot drop, right   . History of kidney stones   . Hyperlipidemia    unspecified  . Hypertension   . Myocardial infarction (HCC)   . Osteoarthritis   . Shoulder pain, left   . Sleep apnea   . Tremor, essential     Past Surgical History:  Procedure Laterality Date  . APPLICATION OF WOUND VAC Right 11/13/2017   Procedure: APPLICATION OF WOUND VAC;  Surgeon: Annice Needy, MD;  Location: [facility] ORS;  Service: Vascular;  Laterality: Right;  . BACK SURGERY  06/2010  . CARDIAC CATHETERIZATION Left 04/30/2016   Procedure: Left Heart Cath and Coronary Angiography;  Surgeon: Laurier Nancy, MD;  Location: [facility] INVASIVE CV LAB;  Service: Cardiovascular;  Laterality: Left;  . COLONOSCOPY    . CORONARY ANGIOPLASTY    . KNEE ARTHROSCOPY Left   . KNEE SURGERY Left 1998  . TONSILLECTOMY    . WOUND DEBRIDEMENT Right 11/13/2017   Procedure: DEBRIDEMENT WOUND;  Surgeon: Annice Needy, MD;  Location: [facility] ORS;  Service: Vascular;  Laterality: Right;    Social History Social History   Tobacco Use  . Smoking status: Light Tobacco Smoker    Packs/day: 0.25    Years: 58.00    Pack years: 14.50    Types: Cigarettes  . Smokeless tobacco: Former Neurosurgeon  . Tobacco comment: 1 cigarette per day  Substance Use Topics  . Alcohol use: No    Alcohol/week: 0.0 oz  . Drug use: No    Family History Family History  Problem Relation Age of Onset  . Diabetes Son   . Cancer Mother   . Colon cancer Mother   . Lung cancer Mother   . Tremor Father   . Heart disease Father   . Tremor Brother      No Known Allergies   REVIEW OF SYSTEMS (Negative unless checked)  Constitutional: Weight loss  [  x]Fever  Chills Cardiac: Chest pain   Chest pressure   Palpitations   Shortness of breath when laying flat   Shortness of breath at rest   Shortness of breath with exertion. Vascular:  Pain in legs with walking   Pain in legs at rest   Pain in legs when laying flat   Claudication   Pain in feet when walking  Pain in feet at rest  Pain in feet when laying flat   History of DVT   Phlebitis   Swelling in legs   Varicose veins   Non-healing ulcers Pulmonary:   Uses home oxygen   Productive cough   Hemoptysis   Wheeze  COPD   Asthma Neurologic:  Dizziness  Blackouts   Seizures   History of stroke   History of TIA  Aphasia   Temporary blindness   Dysphagia   Weakness or numbness in arms   Weakness or numbness in legs Musculoskeletal:  Arthritis   Joint swelling   Joint pain   Low back pain Hematologic:  Easy bruising  Easy bleeding   Hypercoagulable state   Anemic  Hepatitis Gastrointestinal:  Blood in stool   Vomiting blood  Gastroesophageal reflux/heartburn   Difficulty swallowing. Genitourinary:  Chronic kidney disease   Difficult urination  Frequent urination  Burning with urination   Blood in urine Skin:  Rashes   Ulcers   Wounds Psychological:  History of anxiety    History of major depression.  Physical Examination  Vitals:   01/11/18 2104 01/12/18 0437 01/12/18 0500 01/12/18 1140  BP: 115/79 124/73  136/78  Pulse: 71 66  71  Resp: Temp: (!) 101.3 F (38.5 C) (!) 97.5 F (36.4 C)  98.2 F (36.8 C)  TempSrc: Oral Oral  Axillary  SpO2: 97% 100%  100%  Weight:   (!) 300 lb 11.3 oz (136.4 kg)   Height:       Body mass index is 43.15 kg/m. Gen:  WD/WN, NAD.  Morbidly obese Head: [place]/AT, No temporalis wasting.  Ear/Nose/Throat: Hearing grossly  intact, nares w/o erythema or drainage, oropharynx w/o Erythema/Exudate Eyes: Sclera non-icteric, conjunctiva clear Neck: Trachea midline.  No JVD.  Pulmonary:  Good air movement, respirations not labored, equal bilaterally.  Cardiac: Irregular Vascular:  Vessel Right Left  Radial Palpable Palpable                                    Musculoskeletal: M/S 5/5 throughout.  Extremities without ischemic changes.  Right leg wound currently dressed.  About the size of a silver dollar.  No erythema.  No purulent drainage.  No signs of cellulitis currently.  His leg is actually only about 1+ swollen on the right and is 2+ swollen on the left.  Moderate stasis dermatitis changes bilaterally Neurologic: Sensation grossly intact in extremities.  Symmetrical.  Speech is fluent. Motor exam as listed above. Psychiatric: Judgment intact, Mood & affect appropriate for pt's clinical situation. Dermatologic: Right leg wound dressed as above      CBC Lab Results  Component Value Date   WBC 9.5 01/12/2018   HGB 12.1 (L) 01/12/2018   HCT 36.1 (L) 01/12/2018   MCV 94.9 01/12/2018   PLT 128 (L) 01/12/2018    BMET    Component Value Date/Time   NA 138 01/12/2018 0744   NA 142 12/02/2012 0512   K 3.7 01/12/2018 0744  K 3.1 (L) 12/02/2012 0512   CL 100 (L) 01/12/2018 0744   CL 105 12/02/2012 0512   CO2 30 01/12/2018 0744   CO2 29 12/02/2012 0512   GLUCOSE 100 (H) 01/12/2018 0744   GLUCOSE 97 12/02/2012 0512   BUN 43 (H) 01/12/2018 0744   BUN 24 (H) 12/02/2012 0512   CREATININE 1.65 (H) 01/12/2018 0744   CREATININE 1.20 12/02/2012 0512   CALCIUM 8.1 (L) 01/12/2018 0744   CALCIUM 8.2 (L) 12/02/2012 0512   GFRNONAA 38 (L) 01/12/2018 0744   GFRNONAA 59 (L) 12/02/2012 0512   GFRAA 44 (L) 01/12/2018 0744   GFRAA >60 12/02/2012 0512   Estimated Creatinine Clearance: 49.7 mL/min (A) (by C-G formula based on SCr of 1.65 mg/dL (H)).  COAG Lab Results  Component Value Date   INR 1.08  11/11/2017   INR 1.09 06/12/2010   INR 1.14 06/07/2010    Radiology Dg Chest Port 1 View  Result Date: 01/10/2018 CLINICAL DATA:  Cough and fever. EXAM: PORTABLE CHEST 1 VIEW COMPARISON:  12/01/2017 and prior chest radiograph FINDINGS: UPPER limits normal heart size again noted. Mediastinal silhouette is unchanged. Minimal LEFT basilar atelectasis/scarring again noted. There is no evidence of focal airspace disease, pulmonary edema, suspicious pulmonary nodule/mass, pleural effusion, or pneumothorax. No acute bony abnormalities are identified. IMPRESSION: No evidence of acute cardiopulmonary disease. Electronically Signed   By: Harmon Pier M.D.   On: 01/10/2018 21:34      Assessment/Plan 1.  Right leg ulceration which is chronic.  Continue negative pressure dressing.  No signs of infection in this wound currently.  It is possible he had some cellulitis which is quickly resolved with antibiotics continuing the course of antibiotics is very reasonable although I do not think the leg is actively infected. 2.  Sepsis.  Etiology somewhat unclear.  Markedly improved.  Although it is possible his right leg wound was a source, it certainly looks pretty good today.  I did not see it when he was admitted.  Continue antibiotics and management per primary service 3.  Morbid obesity.  Worsens his lower extremity swelling.  Difficult for him to walk secondary to left knee osteoarthritis. 4.  Left knee osteoarthritis.  Had a left knee replacement scheduled that was delayed by his right leg hematoma and subsequent ulceration and infection.  Has been a very slowly healing process.   Festus Barren, MD  01/12/2018 2:25 PM    This note was created with Dragon medical transcription system.  Any error is purely unintentional

## 2018-01-12 NOTE — Progress Notes (Signed)
Patient ID: PHOENYX PAULSEN, male   DOB: Jul 08, 1936, 82 y.o.   MRN: [id number]  [facility] [employer] PROGRESS NOTE  PRATHAM CASSATT NFA:[id number] DOB: [birth year] DOA: 01/10/2018 PCP: [name], MD  HPI/Subjective: Patient feeling much better today.  He did not recognize me from yesterday.  States he feels okay.  No complaints of any pain.  No shortness of breath.  Objective: Vitals:   01/12/18 0437 01/12/18 1140  BP: 124/73 136/78  Pulse: 66 71  Resp: 20 16  Temp: (!) 97.5 F (36.4 C) 98.2 F (36.8 C)  SpO2: 100% 100%    Filed Weights   01/11/18 0024 01/11/18 0500 01/12/18 0500  Weight: 131.9 kg (290 lb 12.6 oz) 133.7 kg (294 lb 12.1 oz) (!) 136.4 kg (300 lb 11.3 oz)    ROS: Review of Systems  Constitutional: Negative for chills and fever.  Eyes: Negative for blurred vision.  Respiratory: Negative for cough and shortness of breath.   Cardiovascular: Negative for chest pain.  Gastrointestinal: Negative for abdominal pain, constipation, diarrhea, nausea and vomiting.  Genitourinary: Negative for dysuria.  Musculoskeletal: Positive for joint pain.  Neurological: Negative for dizziness and headaches.   Exam: Physical Exam  Constitutional: He is oriented to person, place, and time.  HENT:  Nose: No mucosal edema.  Mouth/Throat: No oropharyngeal exudate or posterior oropharyngeal edema.  Eyes: Pupils are equal, round, and reactive to light. Conjunctivae, EOM and lids are normal.  Neck: No JVD present. Carotid bruit is not present. No edema present. No thyroid mass and no thyromegaly present.  Cardiovascular: S1 normal and S2 normal. Exam reveals no gallop.  No murmur heard. Pulses:      Dorsalis pedis pulses are 2+ on the right side, and 2+ on the left side.  Respiratory: No respiratory distress. He has decreased breath sounds in the right lower field and the left lower field. He has no wheezes. He has no rhonchi. He has no rales.  GI: Soft. Bowel sounds are normal. There is no  tenderness.  Musculoskeletal:       Right ankle: He exhibits swelling.       Left ankle: He exhibits swelling.  Lymphadenopathy:    He has no cervical adenopathy.  Neurological: He is alert and oriented to person, place, and time.  Skin: Skin is warm. Nails show no clubbing.  Improved erythema right leg, wound VAC right leg, chronic lower extremity discoloration bilaterally  Psychiatric: He has a normal mood and affect.      Data Reviewed: Basic Metabolic Panel: Recent Labs  Lab 01/10/18 2039 01/11/18 0030 01/12/18 0744  NA 141 140 138  K 3.7 3.3* 3.7  CL 103 105 100*  CO2 GLUCOSE 143* 122* 100*  BUN 41* 46* 43*  CREATININE 1.48* 1.55* 1.65*  CALCIUM 8.9 8.3* 8.1*   Liver Function Tests: Recent Labs  Lab 01/10/18 2039  AST 69*  ALT 73*  ALKPHOS 109  BILITOT 0.6  PROT 6.6  ALBUMIN 3.2*   CBC: Recent Labs  Lab 01/10/18 2039 01/11/18 0030 01/12/18 0744  WBC 18.5* 21.5* 9.5  NEUTROABS 16.7*  --   --   HGB 12.5* 11.6* 12.1*  HCT 37.4* 34.8* 36.1*  MCV 94.9 94.6 94.9  PLT 144* 133* 128*    CBG: Recent Labs  Lab 01/11/18 0747 01/12/18 0727 01/12/18 1139  GLUCAP 107* 78 117*    Recent Results (from the past 240 hour(s))  Blood Culture (routine x 2)  Status: None (Preliminary result)   Collection Time: 01/10/18  8:39 PM  Result Value Ref Range Status   Specimen Description BLOOD LEFT FOREARM  Final   Special Requests   Final    BOTTLES DRAWN AEROBIC AND ANAEROBIC Blood Culture results may not be optimal due to an excessive volume of blood received in culture bottles   Culture   Final    NO GROWTH 2 DAYS Performed at [facility], [address] [place], [place] [address]    Report Status PENDING  Incomplete  Blood Culture (routine x 2)     Status: None (Preliminary result)   Collection Time: 01/10/18  8:42 PM  Result Value Ref Range Status   Specimen Description   Final    BLOOD LEFT FOREARM MEDIAL Performed at [facility], [address], [place], [place] [address]    Special Requests   Final    BOTTLES DRAWN AEROBIC AND ANAEROBIC Blood Culture results may not be optimal due to an excessive volume of blood received in culture bottles Performed at [facility], [address], [place], [place] [address]    Culture  Setup Time   Final    GRAM POSITIVE RODS AEROBIC BOTTLE ONLY DAVID BESANTI AT 0527 ON 01/12/18 [facility]. Performed at [facility], [address], [place], [place] [address]    Culture GRAM POSITIVE RODS  Final   Report Status PENDING  Incomplete  Blood Culture ID Panel (Reflexed)     Status: None   Collection Time: 01/10/18  8:42 PM  Result Value Ref Range Status   Enterococcus species NOT DETECTED NOT DETECTED Final   Listeria monocytogenes NOT DETECTED NOT DETECTED Final   Staphylococcus species NOT DETECTED NOT DETECTED Final   Staphylococcus aureus NOT DETECTED NOT DETECTED Final   Streptococcus species NOT DETECTED NOT DETECTED Final   Streptococcus agalactiae NOT DETECTED NOT DETECTED Final   Streptococcus pneumoniae NOT DETECTED NOT DETECTED Final   Streptococcus pyogenes NOT DETECTED NOT DETECTED Final   Acinetobacter baumannii NOT DETECTED NOT DETECTED Final   Enterobacteriaceae species NOT DETECTED NOT DETECTED Final   Enterobacter cloacae complex NOT DETECTED NOT DETECTED Final   Escherichia coli NOT DETECTED NOT DETECTED Final   Klebsiella oxytoca NOT DETECTED NOT DETECTED Final   Klebsiella pneumoniae NOT DETECTED NOT DETECTED Final   Proteus species NOT DETECTED NOT DETECTED Final   Serratia marcescens NOT DETECTED NOT DETECTED Final   Haemophilus influenzae NOT DETECTED NOT DETECTED Final   Neisseria meningitidis NOT DETECTED NOT DETECTED Final   Pseudomonas aeruginosa NOT DETECTED NOT DETECTED Final   Candida albicans NOT DETECTED NOT DETECTED Final   Candida glabrata NOT DETECTED NOT DETECTED Final   Candida krusei NOT DETECTED NOT DETECTED  Final   Candida parapsilosis NOT DETECTED NOT DETECTED Final   Candida tropicalis NOT DETECTED NOT DETECTED Final    Comment: Performed at [facility], [address] [place], [place] [address]     Studies: Dg Chest Port 1 View  Result Date: 01/10/2018 CLINICAL DATA:  Cough and fever. EXAM: PORTABLE CHEST 1 VIEW COMPARISON:  12/01/2017 and prior chest radiograph FINDINGS: UPPER limits normal heart size again noted. Mediastinal silhouette is unchanged. Minimal LEFT basilar atelectasis/scarring again noted. There is no evidence of focal airspace disease, pulmonary edema, suspicious pulmonary nodule/mass, pleural effusion, or pneumothorax. No acute bony abnormalities are identified. IMPRESSION: No evidence of acute cardiopulmonary disease. Electronically Signed   By: Henrietta Hoover.D.  On: 01/10/2018 21:34    Scheduled Meds: . aspirin EC  81 mg Oral Daily  . atorvastatin  40 mg Oral QPM  . docusate sodium  100 mg Oral BID  . feeding supplement (ENSURE ENLIVE)  237 mL Oral Daily  . fluticasone  1 spray Each Nare Daily  . gabapentin  300 mg Oral BH-q7a  . gabapentin  400 mg Oral QHS  . Gerhardt's butt cream   Topical TID  . heparin  5,000 Units Subcutaneous Q8H  . ipratropium-albuterol  3 mL Nebulization TID  . isosorbide mononitrate  30 mg Oral Daily  . loratadine  10 mg Oral QHS  . multivitamin with minerals  1 tablet Oral Daily  . potassium chloride  10 mEq Oral Daily  . primidone  200 mg Oral BID  . umeclidinium-vilanterol  1 puff Inhalation Daily   Continuous Infusions: . piperacillin-tazobactam (ZOSYN)  IV 3.375 g (01/12/18 1413)  . vancomycin 1,000 mg (01/12/18 1526)    Assessment/Plan:  1. Clinical sepsis likely cellulitis of the right lower extremity, leukocytosis, fever and hypotension.  Patient also had acute encephalopathy which has improved.  Hypotension has improved with fluid boluses.  Continue to hold antihypertensive medications at this time.  Aggressive  antibiotics with vancomycin and Zosyn.  So far blood cultures  one blood culture out of 4+ with gram-positive rods which could be a contaminant.  Urine analysis is negative. 2. Right lower extremity hematoma that popped now with wound VAC 3. Chronic kidney disease stage III. 4. Morbid obesity and sleep apnea on CPAP at night 5. Chronic diastolic congestive heart failure no signs at this point  6. hyperlipidemia unspecified on atorvastatin 7. Neuropathy on gabapentin 8. Weakness Physical therapy evaluation 9. History of right foot drop and left knee arthritis  Code Status:     Code Status Orders  (From admission, onward)        Start     Ordered   01/11/18 0013  Full code  Continuous     01/11/18 0012    Code Status History    Date Active Date Inactive Code Status Order ID Comments User Context   12/01/2017 1924 12/05/2017 1948 Full Code [id number]  Altamese Dilling, MD Inpatient   08/31/2017 0557 09/05/2017 1959 Full Code [id number]  Arnaldo Natal, MD Inpatient   06/10/2016 1934 06/13/2016 1816 Full Code [id number]  Shaune Pollack, MD Inpatient   12/15/2015 0134 12/16/2015 1507 Full Code [id number]  Ihor Austin, MD ED    Advance Directive Documentation     Most Recent Value  Type of Advance Directive  Living will  Pre-existing out of facility DNR order (yellow form or pink MOST form)  -  "MOST" Form in Place?  -     Family Communication: Family at bedside  disposition Plan: Likely will need a few days of antibiotics  Antibiotics:  Vancomycin  Zosyn  Time spent: 27 minutes   Maize Brittingham [employer]

## 2018-01-12 NOTE — Progress Notes (Signed)
PHARMACY - PHYSICIAN COMMUNICATION CRITICAL VALUE ALERT - BLOOD CULTURE IDENTIFICATION (BCID)  Dylan [name] is an 82 y.o. male who presented to [facility] on 01/10/2018 with a chief complaint of afebrile  Assessment:  Afebrile, WBC 18.6 >> 21, 1/4 GPR BCID -  Name of physician (or Provider) Contacted: Barbaraann Rondo  Current antibiotics: Vanc/Zosyn  Changes to prescribed antibiotics recommended:  Patient is on recommended antibiotics no changes needed -- probable contamination  Results for orders placed or performed during the hospital encounter of 01/10/18  Blood Culture ID Panel (Reflexed) (Collected: 01/10/2018  8:42 PM)  Result Value Ref Range   Enterococcus species NOT DETECTED NOT DETECTED   Listeria monocytogenes NOT DETECTED NOT DETECTED   Staphylococcus species NOT DETECTED NOT DETECTED   Staphylococcus aureus NOT DETECTED NOT DETECTED   Streptococcus species NOT DETECTED NOT DETECTED   Streptococcus agalactiae NOT DETECTED NOT DETECTED   Streptococcus pneumoniae NOT DETECTED NOT DETECTED   Streptococcus pyogenes NOT DETECTED NOT DETECTED   Acinetobacter baumannii NOT DETECTED NOT DETECTED   Enterobacteriaceae species NOT DETECTED NOT DETECTED   Enterobacter cloacae complex NOT DETECTED NOT DETECTED   Escherichia coli NOT DETECTED NOT DETECTED   Klebsiella oxytoca NOT DETECTED NOT DETECTED   Klebsiella pneumoniae NOT DETECTED NOT DETECTED   Proteus species NOT DETECTED NOT DETECTED   Serratia marcescens NOT DETECTED NOT DETECTED   Haemophilus influenzae NOT DETECTED NOT DETECTED   Neisseria meningitidis NOT DETECTED NOT DETECTED   Pseudomonas aeruginosa NOT DETECTED NOT DETECTED   Candida albicans NOT DETECTED NOT DETECTED   Candida glabrata NOT DETECTED NOT DETECTED   Candida krusei NOT DETECTED NOT DETECTED   Candida parapsilosis NOT DETECTED NOT DETECTED   Candida tropicalis NOT DETECTED NOT DETECTED   Thomasene Ripple, PharmD, BCPS Clinical  Pharmacist 01/12/2018

## 2018-01-12 NOTE — Progress Notes (Signed)
Pharmacy Antibiotic Note  Dylan [name] is a 82 y.o. male admitted on 01/10/2018 with fever, leukocytosis.  Pharmacy has been consulted for vanc/zosyn dosing.  Plan: Patient received vanc 1g and zosyn 3.375g IV x 1  Will continue vanc 1.25g IV q12h w/ 6 hour stack Will draw vanc trough 05/13 @ 1430 prior to 4th dose. Will continue zosyn 3.375g IV q8h  Ke 0.0488 T1/2 14 ~ 12 hrs Goal trough 15 - 20 mcg/mL 5/13@1500  VT 22, will reduce dose to vancomycin 1gm iv q12h, renal function fairly stable will continue to monitor. Trough after a few doses.    Height:  (177.8 cm) Weight: (!) 300 lb 11.3 oz (136.4 kg) IBW/kg (Calculated) : 73  Temp (24hrs), Avg:99 F (37.2 C), Min:97.5 F (36.4 C), Max:101.3 F (38.5 C)  Recent Labs  Lab 01/10/18 2039 01/11/18 0030 01/12/18 0744 01/12/18 1424  WBC 18.5* 21.5* 9.5  --   CREATININE 1.48* 1.55* 1.65*  --   LATICACIDVEN 1.6 1.6  --   --   VANCOTROUGH  --   --   --  22*    Estimated Creatinine Clearance: 49.7 mL/min (A) (by C-G formula based on SCr of 1.65 mg/dL (H)).    No Known Allergies  Thank you for allowing pharmacy to be a part of this patient's care.  Thomasene Ripple, PharmD, BCPS Clinical Pharmacist 01/12/2018

## 2018-01-12 NOTE — Care Management (Signed)
Patient admitted from home with sepsis.  Patient lives at home with wife.  PCP Arlana Pouch. Patient is open with [facility] at [facility].  [name] with [facility] notified of admission. Patient has home wound vac in his hospital room.  RNCM confirmed it is a [employer] vac.  Patient has been switched over to the hospital vac.  Per Vascular patient will discharge with vac in place, with plan to remove it at his outpatient appointment next week.  Patient states that he has RW, canes, and CPAP in the home.  RNCM following

## 2018-01-13 DIAGNOSIS — S81801A Unspecified open wound, right lower leg, initial encounter: Secondary | ICD-10-CM

## 2018-01-13 DIAGNOSIS — A419 Sepsis, unspecified organism: Secondary | ICD-10-CM

## 2018-01-13 LAB — BASIC METABOLIC PANEL
ANION GAP: 8 (ref 5–15)
BUN: 37 mg/dL — AB (ref 6–20)
CHLORIDE: 100 mmol/L — AB (ref 101–111)
CO2: 26 mmol/L (ref 22–32)
Calcium: 7.8 mg/dL — ABNORMAL LOW (ref 8.9–10.3)
Creatinine, Ser: 1.3 mg/dL — ABNORMAL HIGH (ref 0.61–1.24)
GFR calc Af Amer: 58 mL/min — ABNORMAL LOW (ref >60)
GFR calc non Af Amer: 50 mL/min — ABNORMAL LOW (ref >60)
GLUCOSE: 97 mg/dL (ref 65–99)
POTASSIUM: 3.4 mmol/L — AB (ref 3.5–5.1)
Sodium: 134 mmol/L — ABNORMAL LOW (ref 135–145)

## 2018-01-13 LAB — GLUCOSE, CAPILLARY: Glucose-Capillary: 105 mg/dL — ABNORMAL HIGH (ref 65–99)

## 2018-01-13 MED ORDER — IPRATROPIUM-ALBUTEROL 0.5-2.5 (3) MG/3ML IN SOLN
3.0000 mL | Freq: Two times a day (BID) | RESPIRATORY_TRACT | Status: DC
  Administered 2018-01-14: 3 mL via RESPIRATORY_TRACT
  Filled 2018-01-13: qty 3

## 2018-01-13 MED ORDER — IPRATROPIUM-ALBUTEROL 0.5-2.5 (3) MG/3ML IN SOLN: 3.0000 mL | Freq: Four times a day (QID) | RESPIRATORY_TRACT | Status: DC | PRN

## 2018-01-13 MED ORDER — VANCOMYCIN HCL 10 G IV SOLR
2000.0000 mg | INTRAVENOUS | Status: DC
  Filled 2018-01-13: qty 2000

## 2018-01-13 MED ORDER — DOXYCYCLINE HYCLATE 100 MG PO TABS
100.0000 mg | ORAL_TABLET | Freq: Two times a day (BID) | ORAL | Status: DC
  Administered 2018-01-13 – 2018-01-14 (×3): 100 mg via ORAL
  Filled 2018-01-13 (×3): qty 1

## 2018-01-13 NOTE — Care Management Important Message (Signed)
Copy of signed IM left in patient's room.    

## 2018-01-13 NOTE — Progress Notes (Signed)
[facility]  Daily Progress Note   Subjective  - * No surgery found *  No overnight events Fever curve down.  No pain  Objective Vitals:   01/12/18 2032 01/12/18 2215 01/13/18 0524 01/13/18 0734  BP: 125/63  130/83   Pulse: (!) 56 (!) 10 68   Resp: Temp: 98.6 F (37 C)  98.4 F (36.9 C)   TempSrc: Oral  Oral   SpO2: 99% 99% 97% 96%  Weight:   299 lb 13.2 oz (136 kg)   Height:        Intake/Output Summary (Last 24 hours) at 01/13/2018 0830 Last data filed at 01/13/2018 0500 Gross per 24 hour  Intake 1790 ml  Output 1425 ml  Net 365 ml    PULM  CTAB CV  RRR VASC  Right leg with stable stasis changes and no significant erythema today.  Laboratory CBC    Component Value Date/Time   WBC 9.5 01/12/2018 0744   HGB 12.1 (L) 01/12/2018 0744   HGB 13.0 11/30/2012 1708   HCT 36.1 (L) 01/12/2018 0744   HCT 38.0 (L) 11/30/2012 1708   PLT 128 (L) 01/12/2018 0744   PLT 136 (L) 11/30/2012 1708    BMET    Component Value Date/Time   NA 134 (L) 01/13/2018 0502   NA 142 12/02/2012 0512   K 3.4 (L) 01/13/2018 0502   K 3.1 (L) 12/02/2012 0512   CL 100 (L) 01/13/2018 0502   CL 105 12/02/2012 0512   CO2 26 01/13/2018 0502   CO2 29 12/02/2012 0512   GLUCOSE 97 01/13/2018 0502   GLUCOSE 97 12/02/2012 0512   BUN 37 (H) 01/13/2018 0502   BUN 24 (H) 12/02/2012 0512   CREATININE 1.30 (H) 01/13/2018 0502   CREATININE 1.20 12/02/2012 0512   CALCIUM 7.8 (L) 01/13/2018 0502   CALCIUM 8.2 (L) 12/02/2012 0512   GFRNONAA 50 (L) 01/13/2018 0502   GFRNONAA 59 (L) 12/02/2012 0512   GFRAA 58 (L) 01/13/2018 0502   GFRAA >60 12/02/2012 0512    Assessment/Planning:    Right leg cellulitis. Improved.  Discussed with primary service and switching to oral agents and likely discharge home tomorrow  Has follow up with me next week in the office already    [facility]  01/13/2018, 8:30 AM

## 2018-01-13 NOTE — Progress Notes (Signed)
Patient ID: Dylan [name], male   DOB: [birth year], 82 y.o.   MRN: [id number]  Patient ID: Dylan [name], male   DOB: 09-08-1935, 82 y.o.   MRN: [id number]  [employer] [facility] PROGRESS NOTE  Dylan [name] KGM:[id number] DOB: [birth year] DOA: 01/10/2018 PCP: [name], MD  HPI/Subjective: Patient feeling much better today.  He did not recognize me from yesterday.  States he feels okay.  No complaints of any pain.  No shortness of breath.  Objective: Vitals:   01/13/18 0734 01/13/18 1239  BP:  120/62  Pulse:  70  Resp:  20  Temp:  98.1 F (36.7 C)  SpO2: 96% 97%    Filed Weights   01/11/18 0500 01/12/18 0500 01/13/18 0524  Weight: 133.7 kg (294 lb 12.1 oz) (!) 136.4 kg (300 lb 11.3 oz) 136 kg (299 lb 13.2 oz)    ROS: Review of Systems  Constitutional: Negative for chills and fever.  Eyes: Negative for blurred vision.  Respiratory: Negative for cough and shortness of breath.   Cardiovascular: Negative for chest pain.  Gastrointestinal: Negative for abdominal pain, constipation, diarrhea, nausea and vomiting.  Genitourinary: Negative for dysuria.  Musculoskeletal: Positive for joint pain.  Neurological: Negative for dizziness and headaches.   Exam: Physical Exam  Constitutional: He is oriented to person, place, and time.  HENT:  Nose: No mucosal edema.  Mouth/Throat: No oropharyngeal exudate or posterior oropharyngeal edema.  Eyes: Pupils are equal, round, and reactive to light. Conjunctivae, EOM and lids are normal.  Neck: No JVD present. Carotid bruit is not present. No edema present. No thyroid mass and no thyromegaly present.  Cardiovascular: S1 normal and S2 normal. Exam reveals no gallop.  No murmur heard. Pulses:      Dorsalis pedis pulses are 2+ on the right side, and 2+ on the left side.  Respiratory: No respiratory distress. He has decreased breath sounds in the right lower field and the left lower field. He has no wheezes. He has no rhonchi. He has no rales.  GI:  Soft. Bowel sounds are normal. There is no tenderness.  Musculoskeletal:       Right ankle: He exhibits swelling.       Left ankle: He exhibits swelling.  Lymphadenopathy:    He has no cervical adenopathy.  Neurological: He is alert and oriented to person, place, and time.  Skin: Skin is warm. Nails show no clubbing.  Improved erythema right leg, wound VAC right leg, chronic lower extremity discoloration bilaterally  Psychiatric: He has a normal mood and affect.      Data Reviewed: Basic Metabolic Panel: Recent Labs  Lab 01/10/18 2039 01/11/18 0030 01/12/18 0744 01/13/18 0502  NA 141 140 138 134*  K 3.7 3.3* 3.7 3.4*  CL 103 105 100* 100*  CO2 GLUCOSE 143* 122* 100* 97  BUN 41* 46* 43* 37*  CREATININE 1.48* 1.55* 1.65* 1.30*  CALCIUM 8.9 8.3* 8.1* 7.8*   Liver Function Tests: Recent Labs  Lab 01/10/18 2039  AST 69*  ALT 73*  ALKPHOS 109  BILITOT 0.6  PROT 6.6  ALBUMIN 3.2*   CBC: Recent Labs  Lab 01/10/18 2039 01/11/18 0030 01/12/18 0744  WBC 18.5* 21.5* 9.5  NEUTROABS 16.7*  --   --   HGB 12.5* 11.6* 12.1*  HCT 37.4* 34.8* 36.1*  MCV 94.9 94.6 94.9  PLT 144* 133* 128*    CBG: Recent Labs  Lab 01/11/18 0747 01/12/18 0727 01/12/18 1139 01/13/18  0738  GLUCAP 107* 78 117* 105*    Recent Results (from the past 240 hour(s))  Blood Culture (routine x 2)     Status: None (Preliminary result)   Collection Time: 01/10/18  8:39 PM  Result Value Ref Range Status   Specimen Description BLOOD LEFT FOREARM  Final   Special Requests   Final    BOTTLES DRAWN AEROBIC AND ANAEROBIC Blood Culture results may not be optimal due to an excessive volume of blood received in culture bottles   Culture   Final    NO GROWTH 3 DAYS Performed at [facility], [address] [place], [place] [address]    Report Status PENDING  Incomplete  Blood Culture (routine x 2)     Status: None (Preliminary result)   Collection Time: 01/10/18  8:42 PM   Result Value Ref Range Status   Specimen Description   Final    BLOOD LEFT FOREARM MEDIAL Performed at [facility], [address], [place], [place] [address]    Special Requests   Final    BOTTLES DRAWN AEROBIC AND ANAEROBIC Blood Culture results may not be optimal due to an excessive volume of blood received in culture bottles Performed at [facility], [address], [place], [place] [address]    Culture  Setup Time   Final    GRAM POSITIVE RODS AEROBIC BOTTLE ONLY DAVID BESANTI AT 0527 ON 01/12/18 [facility].    Culture   Final    GRAM POSITIVE RODS IDENTIFICATION TO FOLLOW Performed at [facility], [address] [place], [place] [address]    Report Status PENDING  Incomplete  Blood Culture ID Panel (Reflexed)     Status: None   Collection Time: 01/10/18  8:42 PM  Result Value Ref Range Status   Enterococcus species NOT DETECTED NOT DETECTED Final   Listeria monocytogenes NOT DETECTED NOT DETECTED Final   Staphylococcus species NOT DETECTED NOT DETECTED Final   Staphylococcus aureus NOT DETECTED NOT DETECTED Final   Streptococcus species NOT DETECTED NOT DETECTED Final   Streptococcus agalactiae NOT DETECTED NOT DETECTED Final   Streptococcus pneumoniae NOT DETECTED NOT DETECTED Final   Streptococcus pyogenes NOT DETECTED NOT DETECTED Final   Acinetobacter baumannii NOT DETECTED NOT DETECTED Final   Enterobacteriaceae species NOT DETECTED NOT DETECTED Final   Enterobacter cloacae complex NOT DETECTED NOT DETECTED Final   Escherichia coli NOT DETECTED NOT DETECTED Final   Klebsiella oxytoca NOT DETECTED NOT DETECTED Final   Klebsiella pneumoniae NOT DETECTED NOT DETECTED Final   Proteus species NOT DETECTED NOT DETECTED Final   Serratia marcescens NOT DETECTED NOT DETECTED Final   Haemophilus influenzae NOT DETECTED NOT DETECTED Final   Neisseria meningitidis NOT DETECTED NOT DETECTED Final   Pseudomonas aeruginosa NOT DETECTED NOT DETECTED Final    Candida albicans NOT DETECTED NOT DETECTED Final   Candida glabrata NOT DETECTED NOT DETECTED Final   Candida krusei NOT DETECTED NOT DETECTED Final   Candida parapsilosis NOT DETECTED NOT DETECTED Final   Candida tropicalis NOT DETECTED NOT DETECTED Final    Comment: Performed at [facility], [address] [place], [place] [address]     Studies: No results found.  Scheduled Meds: . aspirin EC  81 mg Oral Daily  . atorvastatin  40 mg Oral QPM  . docusate sodium  100 mg Oral BID  . doxycycline  100 mg Oral Q12H  . feeding supplement (ENSURE ENLIVE)  237 mL Oral Daily  .  fluticasone  1 spray Each Nare Daily  . gabapentin  300 mg Oral BH-q7a  . gabapentin  400 mg Oral QHS  . Gerhardt's butt cream   Topical TID  . heparin  5,000 Units Subcutaneous Q8H  . ipratropium-albuterol  3 mL Nebulization BID  . isosorbide mononitrate  30 mg Oral Daily  . loratadine  10 mg Oral QHS  . multivitamin with minerals  1 tablet Oral Daily  . potassium chloride  10 mEq Oral Daily  . primidone  200 mg Oral BID  . umeclidinium-vilanterol  1 puff Inhalation Daily   Continuous Infusions: . piperacillin-tazobactam (ZOSYN)  IV Stopped (01/13/18 0900)    Assessment/Plan:  1. Clinical sepsis likely cellulitis of the right lower extremity, leukocytosis, fever and hypotension.  Patient also had acute encephalopathy which has improved.  Hypotension has improved with fluid boluses.  Continue to hold antihypertensive medications at this time.  Change antibiotics to doxycycline and continue Zosyn at this point..  So far blood cultures  one blood culture out of 4+ with gram-positive rods which could be a contaminant.  Urine analysis is negative. 2. Right lower extremity hematoma that popped now with wound VAC 3. Chronic kidney disease stage III. 4. Morbid obesity and sleep apnea on CPAP at night 5. Chronic diastolic congestive heart failure no signs at this point  6. hyperlipidemia unspecified on  atorvastatin 7. Neuropathy on gabapentin 8. Weakness Physical therapy evaluation appreciated.  Hopefully home with home health tomorrow. 9. History of right foot drop and left knee arthritis  Code Status:     Code Status Orders  (From admission, onward)        Start     Ordered   01/11/18 0013  Full code  Continuous     01/11/18 0012    Code Status History    Date Active Date Inactive Code Status Order ID Comments User Context   12/01/2017 1924 12/05/2017 1948 Full Code [id number]  Altamese Dilling, MD Inpatient   08/31/2017 0557 09/05/2017 1959 Full Code [id number]  Arnaldo Natal, MD Inpatient   06/10/2016 1934 06/13/2016 1816 Full Code [id number]  Shaune Pollack, MD Inpatient   12/15/2015 0134 12/16/2015 1507 Full Code [id number]  Ihor Austin, MD ED    Advance Directive Documentation     Most Recent Value  Type of Advance Directive  Living will  Pre-existing out of facility DNR order (yellow form or pink MOST form)  -  "MOST" Form in Place?  -     disposition Plan:  potential disposition tomorrow after blood cultures  fully resulted  Antibiotics:  doxycycline  Zosyn  Time spent: 26 minutes   Jacquise Rarick [employer]

## 2018-01-13 NOTE — Progress Notes (Signed)
Pharmacy Antibiotic Note  Dylan [name] is a 82 y.o. male admitted on 01/10/2018 with fever, leukocytosis.  Pharmacy has been consulted for vanc/zosyn dosing.  Plan: Patient received vanc 1g and zosyn 3.375g IV x 1  Will continue vanc 1.25g IV q12h w/ 6 hour stack Will draw vanc trough 05/13 @ 1430 prior to 4th dose. Will continue zosyn 3.375g IV q8h  Ke 0.0488 T1/2 14 ~ 12 hrs Goal trough 15 - 20 mcg/mL 5/13@1500  VT 22, will reduce dose to vancomycin 1gm iv q12h, renal function fairly stable will continue to monitor. Trough after a few doses.  5/14@0800  Crcl 63, estimated t1/2 still over 12 hours. Will need to change to q18h or q24h dosing.  Vancomycin  iv q18h, given 10 hours after last 1gm dose. Will check trough after 3 doses. Patient is 300lbs.   Height:  (177.8 cm) Weight: 299 lb 13.2 oz (136 kg) IBW/kg (Calculated) : 73  Temp (24hrs), Avg:98.4 F (36.9 C), Min:98.2 F (36.8 C), Max:98.6 F (37 C)  Recent Labs  Lab 01/10/18 2039 01/11/18 0030 01/12/18 0744 01/12/18 1424 01/13/18 0502  WBC 18.5* 21.5* 9.5  --   --   CREATININE 1.48* 1.55* 1.65*  --  1.30*  LATICACIDVEN 1.6 1.6  --   --   --   VANCOTROUGH  --   --   --  22*  --     Estimated Creatinine Clearance: 62.9 mL/min (A) (by C-G formula based on SCr of 1.3 mg/dL (H)).    No Known Allergies  Thank you for allowing pharmacy to be a part of this patient's care. Luan Pulling, PharmD, MBA, BCGP Clinical Pharmacist  01/13/2018

## 2018-01-14 LAB — GLUCOSE, CAPILLARY: GLUCOSE-CAPILLARY: 94 mg/dL (ref 65–99)

## 2018-01-14 LAB — CULTURE, BLOOD (ROUTINE X 2)

## 2018-01-14 MED ORDER — KETOTIFEN FUMARATE 0.025 % OP SOLN
1.0000 [drp] | Freq: Two times a day (BID) | OPHTHALMIC | Status: DC
  Administered 2018-01-14: 1 [drp] via OPHTHALMIC
  Filled 2018-01-14: qty 5

## 2018-01-14 MED ORDER — CEPHALEXIN 500 MG PO CAPS: 500.0000 mg | ORAL_CAPSULE | Freq: Two times a day (BID) | ORAL | Status: DC

## 2018-01-14 MED ORDER — DOXYCYCLINE HYCLATE 100 MG PO TABS: 100.0000 mg | ORAL_TABLET | Freq: Two times a day (BID) | ORAL | 0 refills | Status: DC

## 2018-01-14 MED ORDER — CEPHALEXIN 500 MG PO CAPS: 500.0000 mg | ORAL_CAPSULE | Freq: Two times a day (BID) | ORAL | 0 refills | Status: DC

## 2018-01-14 MED ORDER — KETOTIFEN FUMARATE 0.025 % OP SOLN: 1.0000 [drp] | Freq: Two times a day (BID) | OPHTHALMIC | 0 refills | Status: DC

## 2018-01-14 NOTE — Care Management Note (Signed)
Case Management Note  Patient Details  Name: Dylan [name] MRN: [id number] Date of Birth: November 08, 1935   Patient to discharge home today.  Orders have been written for home vac changes to be done twice a week. [name] with [facility] notified of discharge, and informed last vac changed was 01/12/18.  RNCM signing off.   Subjective/Objective:                    Action/Plan:   Expected Discharge Date:  01/14/18               Expected Discharge Plan:  Home w Home Health Services  In-House Referral:     Discharge planning Services  CM Consult  Post Acute Care Choice:  Home Health, Resumption of Svcs/PTA Provider Choice offered to:  Patient  DME Arranged:    DME Agency:     HH Arranged:  RN, PT, Nurse's Aide HH Agency:  [facility] (now [facility])  Status of Service:  Completed, signed off  If discussed at [employer] of Stay Meetings, dates discussed:    Additional Comments:  Chapman Fitch, RN 01/14/2018, 10:27 AM

## 2018-01-14 NOTE — Progress Notes (Signed)
Dylan [name] to be D/C'd Home per MD order.  Discussed prescriptions and follow up appointments with the [name]. Prescriptions given to [name], medication list explained in detail. Pt verbalized understanding.  Allergies as of 01/14/2018   No Known Allergies     Medication List    STOP taking these medications   carvedilol 3.125 MG tablet Commonly known as:  COREG   DERMACLOUD Crea   HYDROcodone-acetaminophen 5-325 MG tablet Commonly known as:  NORCO/VICODIN   levofloxacin 750 MG tablet Commonly known as:  LEVAQUIN   lisinopril 5 MG tablet Commonly known as:  PRINIVIL,ZESTRIL   naphazoline-pheniramine 0.025-0.3 % ophthalmic solution Commonly known as:  NAPHCON-A   spironolactone 25 MG tablet Commonly known as:  ALDACTONE     TAKE these medications   acetaminophen 325 MG tablet Commonly known as:  TYLENOL Take 2 tablets (650 mg total) by mouth every 6 (six) hours as needed for mild pain or fever.   ALLERGY PO Take 1 tablet by mouth daily as needed (allergies).   aspirin EC 81 MG tablet Take 81 mg by mouth daily.   atorvastatin 40 MG tablet Commonly known as:  LIPITOR Take 40 mg by mouth every evening.   CENTROVITE Tabs Take 1 tablet by mouth daily.   cephALEXin 500 MG capsule Commonly known as:  KEFLEX Take 1 capsule (500 mg total) by mouth every 12 (twelve) hours.   docusate sodium 100 MG capsule Commonly known as:  COLACE Take 200 mg by mouth at bedtime.   doxycycline 100 MG tablet Commonly known as:  VIBRA-TABS Take 1 tablet (100 mg total) by mouth every 12 (twelve) hours.   ENDIT EX Apply topically to sacrum/buttocks 2 times daily and as needed for irritation   fluticasone 50 MCG/ACT nasal spray Commonly known as:  FLONASE Place 1 spray into both nostrils daily.   gabapentin 400 MG capsule Commonly known as:  NEURONTIN Take 400 mg by mouth at bedtime.   gabapentin 300 MG capsule Commonly known as:  NEURONTIN Take 300 mg by mouth every  morning.   ipratropium-albuterol 0.5-2.5 (3) MG/3ML Soln Commonly known as:  DUONEB Take 3 mLs by nebulization every 6 (six) hours as needed (shortness of breath).   isosorbide mononitrate 30 MG 24 hr tablet Commonly known as:  IMDUR Take 30 mg by mouth daily.   ketotifen 0.025 % ophthalmic solution Commonly known as:  ZADITOR Place 1 drop into both eyes 2 (two) times daily.   lactose free nutrition Liqd Take 237 mLs by mouth daily.   nitroGLYCERIN 0.3 MG SL tablet Commonly known as:  NITROSTAT Place 0.3 mg under the tongue every 5 (five) minutes as needed for chest pain.   Potassium 99 MG Tabs Take 99 mg by mouth daily.   primidone 50 MG tablet Commonly known as:  MYSOLINE Take 200 mg by mouth 2 (two) times daily. 4 tabs   torsemide 20 MG tablet Commonly known as:  DEMADEX Take 20 mg by mouth daily.   umeclidinium-vilanterol 62.5-25 MCG/INH Aepb Commonly known as:  ANORO ELLIPTA Inhale 1 puff into the lungs daily.       Vitals:   01/14/18 0753 01/14/18 1222  BP:  (!) 124/102  Pulse:  71  Resp:  18  Temp:  98.8 F (37.1 C)  SpO2: 97% 98%    Skin clean, dry and intact without evidence of skin break down, no evidence of skin tears noted. IV catheter discontinued intact. Site without signs and symptoms of complications. Dressing  and pressure applied. Pt denies pain at this time. No complaints noted.   An After Visit Summary was printed and given to the [name]. [name] escorted via WC, and D/C home via private auto.  Dylan [name]

## 2018-01-14 NOTE — Discharge Summary (Signed)
[facility] [place] [facility]   PATIENT NAME: Dylan [name]    MR#:  [id number]  DATE OF BIRTH:  Dec 18, 1935  DATE OF ADMISSION:  01/10/2018 ADMITTING PHYSICIAN: Cammy Copa, MD  DATE OF DISCHARGE: 01/14/2018  PRIMARY CARE PHYSICIAN: [name], MD    ADMISSION DIAGNOSIS:  Cellulitis of right lower extremity [L03.115] Sepsis, due to unspecified organism  [facility]) [A41.9]  DISCHARGE DIAGNOSIS:  Active Problems:   Sepsis (HCC)   SECONDARY DIAGNOSIS:   Past Medical History:  Diagnosis Date  . CAD (coronary artery disease)   . Cervicalgia   . CHF (congestive heart failure) (HCC)   . COPD (chronic obstructive pulmonary disease) (HCC)   . Diastolic heart failure (HCC)   . Foot drop, right   . History of kidney stones   . Hyperlipidemia    unspecified  . Hypertension   . Myocardial infarction (HCC)   . Osteoarthritis   . Shoulder pain, left   . Sleep apnea   . Tremor, essential     HOSPITAL COURSE:   1.  Clinical sepsis.  This is cellulitis of the lower extremity as the cause with leukocytosis, fever and hypotension.  The patient also had acute metabolic encephalopathy secondary to sepsis.  Hypotension improved by giving fluid boluses and holding antihypertensive medications at this time.  The patient was initially placed on vancomycin and Zosyn.  One blood culture out of 4 was positive for corynebacterium which is a skin contaminant.  Urine analysis was negative.  Discharged home on doxycycline and Keflex.  Continue wound VAC to the right lower extremity. 2.  Right lower extremity hematoma that popped and now has a wound VAC.  Seen by consultation by Dr. Wyn Quaker.  Follow-up with Dr. Wyn Quaker as outpatient.   3.  Chronic kidney disease stage III 4.  Morbid obesity and sleep apnea on CPAP at night 5.  Chronic diastolic congestive heart failure.  No signs at this point.  Can restart torsemide as outpatient. 6.  Hyperlipidemia unspecified on atorvastatin 7.   Neuropathy on gabapentin 8.  Weakness.  Physical therapy evaluation appreciated.  Home with home health. 9.  History of right dropfoot, left knee arthritis and right leg wound VAC.  All these things make it hard for this patient to ambulate. 10.  Eye allergy started eyedrop.   DISCHARGE CONDITIONS:   Satisfactory  CONSULTS OBTAINED:  Treatment Team:  Renford Dills, MD  DRUG ALLERGIES:  No Known Allergies  DISCHARGE MEDICATIONS:   Allergies as of 01/14/2018   No Known Allergies     Medication List    STOP taking these medications   carvedilol 3.125 MG tablet Commonly known as:  COREG   DERMACLOUD Crea   HYDROcodone-acetaminophen 5-325 MG tablet Commonly known as:  NORCO/VICODIN   levofloxacin 750 MG tablet Commonly known as:  LEVAQUIN   lisinopril 5 MG tablet Commonly known as:  PRINIVIL,ZESTRIL   naphazoline-pheniramine 0.025-0.3 % ophthalmic solution Commonly known as:  NAPHCON-A   spironolactone 25 MG tablet Commonly known as:  ALDACTONE     TAKE these medications   acetaminophen 325 MG tablet Commonly known as:  TYLENOL Take 2 tablets (650 mg total) by mouth every 6 (six) hours as needed for mild pain or fever.   ALLERGY PO Take 1 tablet by mouth daily as needed (allergies).   aspirin EC 81 MG tablet Take 81 mg by mouth daily.   atorvastatin 40 MG tablet Commonly known as:  LIPITOR Take 40 mg by  mouth every evening.   CENTROVITE Tabs Take 1 tablet by mouth daily.   cephALEXin 500 MG capsule Commonly known as:  KEFLEX Take 1 capsule (500 mg total) by mouth every 12 (twelve) hours.   docusate sodium 100 MG capsule Commonly known as:  COLACE Take 200 mg by mouth at bedtime.   doxycycline 100 MG tablet Commonly known as:  VIBRA-TABS Take 1 tablet (100 mg total) by mouth every 12 (twelve) hours.   ENDIT EX Apply topically to sacrum/buttocks 2 times daily and as needed for irritation   fluticasone 50 MCG/ACT nasal spray Commonly known  as:  FLONASE Place 1 spray into both nostrils daily.   gabapentin 400 MG capsule Commonly known as:  NEURONTIN Take 400 mg by mouth at bedtime.   gabapentin 300 MG capsule Commonly known as:  NEURONTIN Take 300 mg by mouth every morning.   ipratropium-albuterol 0.5-2.5 (3) MG/3ML Soln Commonly known as:  DUONEB Take 3 mLs by nebulization every 6 (six) hours as needed (shortness of breath).   isosorbide mononitrate 30 MG 24 hr tablet Commonly known as:  IMDUR Take 30 mg by mouth daily.   ketotifen 0.025 % ophthalmic solution Commonly known as:  ZADITOR Place 1 drop into both eyes 2 (two) times daily.   lactose free nutrition Liqd Take 237 mLs by mouth daily.   nitroGLYCERIN 0.3 MG SL tablet Commonly known as:  NITROSTAT Place 0.3 mg under the tongue every 5 (five) minutes as needed for chest pain.   Potassium 99 MG Tabs Take 99 mg by mouth daily.   primidone 50 MG tablet Commonly known as:  MYSOLINE Take 200 mg by mouth 2 (two) times daily. 4 tabs   torsemide 20 MG tablet Commonly known as:  DEMADEX Take 20 mg by mouth daily.   umeclidinium-vilanterol 62.5-25 MCG/INH Aepb Commonly known as:  ANORO ELLIPTA Inhale 1 puff into the lungs daily.        DISCHARGE INSTRUCTIONS:    Follow-up PMD 6 days Follow-up with vascular surgery 2 weeks   If you experience worsening of your admission symptoms, develop shortness of breath, life threatening emergency, suicidal or homicidal thoughts you must seek medical attention immediately by calling 911 or calling your MD immediately  if symptoms less severe.  You Must read complete instructions/literature along with all the possible adverse reactions/side effects for all the Medicines you take and that have been prescribed to you. Take any new Medicines after you have completely understood and accept all the possible adverse reactions/side effects.   Please note  You were cared for by a hospitalist during your hospital  stay. If you have any questions about your discharge medications or the care you received while you were in the hospital after you are discharged, you can call the unit and asked to speak with the hospitalist on call if the hospitalist that took care of you is not available. Once you are discharged, your primary care physician will handle any further medical issues. Please note that NO REFILLS for any discharge medications will be authorized once you are discharged, as it is imperative that you return to your primary care physician (or establish a relationship with a primary care physician if you do not have one) for your aftercare needs so that they can reassess your need for medications and monitor your lab values.    Today   CHIEF COMPLAINT:   Chief Complaint  Patient presents with  . Fever    HISTORY OF PRESENT ILLNESS:  Dylan [name]  is a 82 y.o. male with a presents with fever.   VITAL SIGNS:  Blood pressure 121/89, heart rate 65, respirations 16, pulse ox on room air 98%.  PHYSICAL EXAMINATION:  GENERAL:  82 y.o.-year-old patient lying in the bed with no acute distress.  EYES: Pupils equal, round, reactive to light and accommodation. No scleral icterus. Extraocular muscles intact.  HEENT: Head atraumatic, normocephalic. Oropharynx and nasopharynx clear.  NECK:  Supple, no jugular venous distention. No thyroid enlargement, no tenderness.  LUNGS: Normal breath sounds bilaterally, no wheezing, rales,rhonchi or crepitation. No use of accessory muscles of respiration.  CARDIOVASCULAR: S1, S2 normal. No murmurs, rubs, or gallops.  ABDOMEN: Soft, non-tender, non-distended. Bowel sounds present. No organomegaly or mass.  EXTREMITIES: 3+ pedal edema, no cyanosis, or clubbing.  NEUROLOGIC: Cranial nerves II through XII are intact. Muscle strength 5/5 in all extremities. Sensation intact. Gait not checked.  PSYCHIATRIC: The patient is alert and oriented x 3.  SKIN: Right lower extremity has  wound VAC on it.  The erythema that was seen on the right lower extremity has now disappeared.  Chronic lower extremity discoloration bilateral lower extremities  DATA REVIEW:   CBC Recent Labs  Lab 01/12/18 0744  WBC 9.5  HGB 12.1*  HCT 36.1*  PLT 128*    Chemistries  Recent Labs  Lab 01/10/18 2039  01/13/18 0502  NA 141   < > 134*  K 3.7   < > 3.4*  CL 103   < > 100*  CO2 30   < > 26  GLUCOSE 143*   < > 97  BUN 41*   < > 37*  CREATININE 1.48*   < > 1.30*  CALCIUM 8.9   < > 7.8*  AST 69*  --   --   ALT 73*  --   --   ALKPHOS 109  --   --   BILITOT 0.6  --   --    < > = values in this interval not displayed.    Cardiac Enzymes No results for input(s): TROPONINI in the last 168 hours.  Microbiology Results  Results for orders placed or performed during the hospital encounter of 01/10/18  Blood Culture (routine x 2)     Status: None (Preliminary result)   Collection Time: 01/10/18  8:39 PM  Result Value Ref Range Status   Specimen Description BLOOD LEFT FOREARM  Final   Special Requests   Final    BOTTLES DRAWN AEROBIC AND ANAEROBIC Blood Culture results may not be optimal due to an excessive volume of blood received in culture bottles   Culture   Final    NO GROWTH 4 DAYS Performed at [facility], [address], [place], [place] [address]    Report Status PENDING  Incomplete  Blood Culture (routine x 2)     Status: Abnormal   Collection Time: 01/10/18  8:42 PM  Result Value Ref Range Status   Specimen Description   Final    BLOOD LEFT FOREARM MEDIAL Performed at [facility], [address] [place], [place] [address]    Special Requests   Final    BOTTLES DRAWN AEROBIC AND ANAEROBIC Blood Culture results may not be optimal due to an excessive volume of blood received in culture bottles Performed at [facility], [address] [place], [place] [address]    Culture  Setup Time   Final    GRAM POSITIVE RODS AEROBIC BOTTLE  ONLY  DAVID BESANTI AT 1610 ON 01/12/18 [facility].    Culture (A)  Final    CORYNEBACTERIUM STRIATUM Standardized susceptibility testing for this organism is not available. Performed at [facility], [address] [place], [place] [address]    Report Status 01/14/2018 FINAL  Final  Blood Culture ID Panel (Reflexed)     Status: None   Collection Time: 01/10/18  8:42 PM  Result Value Ref Range Status   Enterococcus species NOT DETECTED NOT DETECTED Final   Listeria monocytogenes NOT DETECTED NOT DETECTED Final   Staphylococcus species NOT DETECTED NOT DETECTED Final   Staphylococcus aureus NOT DETECTED NOT DETECTED Final   Streptococcus species NOT DETECTED NOT DETECTED Final   Streptococcus agalactiae NOT DETECTED NOT DETECTED Final   Streptococcus pneumoniae NOT DETECTED NOT DETECTED Final   Streptococcus pyogenes NOT DETECTED NOT DETECTED Final   Acinetobacter baumannii NOT DETECTED NOT DETECTED Final   Enterobacteriaceae species NOT DETECTED NOT DETECTED Final   Enterobacter cloacae complex NOT DETECTED NOT DETECTED Final   Escherichia coli NOT DETECTED NOT DETECTED Final   Klebsiella oxytoca NOT DETECTED NOT DETECTED Final   Klebsiella pneumoniae NOT DETECTED NOT DETECTED Final   Proteus species NOT DETECTED NOT DETECTED Final   Serratia marcescens NOT DETECTED NOT DETECTED Final   Haemophilus influenzae NOT DETECTED NOT DETECTED Final   Neisseria meningitidis NOT DETECTED NOT DETECTED Final   Pseudomonas aeruginosa NOT DETECTED NOT DETECTED Final   Candida albicans NOT DETECTED NOT DETECTED Final   Candida glabrata NOT DETECTED NOT DETECTED Final   Candida krusei NOT DETECTED NOT DETECTED Final   Candida parapsilosis NOT DETECTED NOT DETECTED Final   Candida tropicalis NOT DETECTED NOT DETECTED Final    Comment: Performed at [facility] [address], [place], [place] [address]    Management plans discussed with the patient, and he is in agreement.  CODE  STATUS:     Code Status Orders  (From admission, onward)        Start     Ordered   01/11/18 0013  Full code  Continuous     01/11/18 0012    Code Status History    Date Active Date Inactive Code Status Order ID Comments User Context   12/01/2017 1924 12/05/2017 1948 Full Code [id number]  Altamese Dilling, MD Inpatient   08/31/2017 0557 09/05/2017 1959 Full Code [id number]  Arnaldo Natal, MD Inpatient   06/10/2016 1934 06/13/2016 1816 Full Code [id number]  Shaune Pollack, MD Inpatient   12/15/2015 0134 12/16/2015 1507 Full Code [id number]  Ihor Austin, MD ED    Advance Directive Documentation     Most Recent Value  Type of Advance Directive  Living will  Pre-existing out of facility DNR order (yellow form or pink MOST form)  -  "MOST" Form in Place?  -      TOTAL TIME TAKING CARE OF THIS PATIENT: 35 minutes.    Alford Highland M.D on 01/14/2018 at 3:25 PM  Between 7am to 6pm - Pager - [phone]  After 6pm go to [email or web address] - password [email or web address]  [facility] Office  [phone]  CC: Primary care physician; [name], MD

## 2018-01-15 LAB — CULTURE, BLOOD (ROUTINE X 2): Culture: NO GROWTH

## 2018-01-23 ENCOUNTER — Encounter (INDEPENDENT_AMBULATORY_CARE_PROVIDER_SITE_OTHER): Payer: Self-pay | Admitting: Vascular Surgery

## 2018-01-23 ENCOUNTER — Ambulatory Visit (INDEPENDENT_AMBULATORY_CARE_PROVIDER_SITE_OTHER): Payer: Medicare Other | Admitting: Vascular Surgery

## 2018-01-23 VITALS — BP 133/79 | HR 60 | Resp 16 | Ht 70.0 in | Wt 299.1 lb

## 2018-01-23 DIAGNOSIS — I890 Lymphedema, not elsewhere classified: Secondary | ICD-10-CM

## 2018-01-23 DIAGNOSIS — S8011XD Contusion of right lower leg, subsequent encounter: Secondary | ICD-10-CM

## 2018-01-23 NOTE — Progress Notes (Signed)
Subjective:    Patient ID: Dylan [name], male    DOB: November 04, 1935, 82 y.o.   MRN: [id number] Chief Complaint  Patient presents with  . Wound Check   Patient presents today for a one-month wound check.  The patient has been receiving visiting nurse services who has been placing a [employer] wound VAC to the right calf wound.  The patient presents today stating that the visiting nurse and that the last time he saw Dr. Wyn Quaker he was told that the [facility] will be removed today.  The patient is pretty adamant about removing the VAC today.  Patient states that there is barely any drainage coming from the [facility].  The patient denies any worsening in the wound status.  Patient denies any erythema to the lower extremity.  Patient denies any necrotic tissue to the area.  Patient denies any fever, nausea vomiting.  Review of Systems  Constitutional: Negative.   HENT: Negative.   Eyes: Negative.   Respiratory: Negative.   Cardiovascular: Negative.   Gastrointestinal: Negative.   Endocrine: Negative.   Genitourinary: Negative.   Musculoskeletal: Negative.   Skin: Positive for wound.  Allergic/Immunologic: Negative.   Neurological: Negative.   Hematological: Negative.   Psychiatric/Behavioral: Negative.       Objective:   Physical Exam  Constitutional: He is oriented to person, place, and time. He appears well-developed and well-nourished. No distress.  HENT:  Head: Normocephalic and atraumatic.  Right Ear: External ear normal.  Left Ear: External ear normal.  Eyes: Pupils are equal, round, and reactive to light. Conjunctivae and EOM are normal.  Neck: Normal range of motion.  Cardiovascular: Normal rate, regular rhythm, normal heart sounds and intact distal pulses.  Pulses:      Radial pulses are 2+ on the right side, and 2+ on the left side.  Unable to palpate pedal pulses due to severe edema and body habitus  Pulmonary/Chest: Effort normal and breath sounds normal.  Musculoskeletal: Normal range of  motion. He exhibits edema (Severe edema to the bilateral lower extremity).  Neurological: He is alert and oriented to person, place, and time.  Skin: He is not diaphoretic.  4 cm x 3 cm circular wound to the lateral aspect of the right calf.  The wound bed is 100% granulation tissue however is not completely healed.  It is draining a clear serous fluid.  The surrounding skin is healthy.  There is no necrotic tissue noted.  There is no cellulitis to the bilateral lower extremities.  Psychiatric: He has a normal mood and affect. His behavior is normal. Judgment and thought content normal.  Vitals reviewed.  BP 133/79 (BP Location: Right Arm)   Pulse 60   Resp 16   Ht  (1.778 m)   Wt 299 lb 2.1 oz (135.7 kg)   BMI 42.92 kg/m   Past Medical History:  Diagnosis Date  . CAD (coronary artery disease)   . Cervicalgia   . CHF (congestive heart failure) (HCC)   . COPD (chronic obstructive pulmonary disease) (HCC)   . Diastolic heart failure (HCC)   . Foot drop, right   . History of kidney stones   . Hyperlipidemia    unspecified  . Hypertension   . Myocardial infarction (HCC)   . Osteoarthritis   . Shoulder pain, left   . Sleep apnea   . Tremor, essential    Social History   Socioeconomic History  . Marital status: Married    Spouse name: [name]  .  Number of children: 2  . Years of education: 50  . Highest education level: 11th grade  Occupational History  . Occupation: retired  Engineer, production  . Financial resource strain: Not on file  . Food insecurity:    Worry: Not on file    Inability: Not on file  . Transportation needs:    Medical: Not on file    Non-medical: Not on file  Tobacco Use  . Smoking status: Light Tobacco Smoker    Packs/day: 0.25    Years: 58.00    Pack years: 14.50    Types: Cigarettes  . Smokeless tobacco: Former Neurosurgeon  . Tobacco comment: 1 cigarette per day  Substance and Sexual Activity  . Alcohol use: No    Alcohol/week: 0.0 oz  . Drug  use: No  . Sexual activity: Not on file  Lifestyle  . Physical activity:    Days per week: Not on file    Minutes per session: Not on file  . Stress: Not on file  Relationships  . Social connections:    Talks on phone: Not on file    Gets together: Not on file    Attends religious service: Not on file    Active member of club or organization: Not on file    Attends meetings of clubs or organizations: Not on file    Relationship status: Not on file  . Intimate partner violence:    Fear of current or ex partner: Not on file    Emotionally abused: Not on file    Physically abused: Not on file    Forced sexual activity: Not on file  Other Topics Concern  . Not on file  Social History Narrative   Full Code with HCPOA and living will   Married with 2 children   Former smoker   Alcohol - none   Smokeless tobacco - none   Past Surgical History:  Procedure Laterality Date  . APPLICATION OF WOUND VAC Right 11/13/2017   Procedure: APPLICATION OF WOUND VAC;  Surgeon: Annice Needy, MD;  Location: [facility] ORS;  Service: Vascular;  Laterality: Right;  . BACK SURGERY  06/2010  . CARDIAC CATHETERIZATION Left 04/30/2016   Procedure: Left Heart Cath and Coronary Angiography;  Surgeon: Laurier Nancy, MD;  Location: [facility] INVASIVE CV LAB;  Service: Cardiovascular;  Laterality: Left;  . COLONOSCOPY    . CORONARY ANGIOPLASTY    . KNEE ARTHROSCOPY Left   . KNEE SURGERY Left 1998  . TONSILLECTOMY    . WOUND DEBRIDEMENT Right 11/13/2017   Procedure: DEBRIDEMENT WOUND;  Surgeon: Annice Needy, MD;  Location: [facility] ORS;  Service: Vascular;  Laterality: Right;   Family History  Problem Relation Age of Onset  . Diabetes Son   . Cancer Mother   . Colon cancer Mother   . Lung cancer Mother   . Tremor Father   . Heart disease Father   . Tremor Brother    No Known Allergies     Assessment & Plan:  Patient presents today for a one-month wound check.  The patient has been receiving visiting nurse  services who has been placing a [employer] wound VAC to the right calf wound.  The patient presents today stating that the visiting nurse and that the last time he saw Dr. Wyn Quaker he was told that the [facility] will be removed today.  The patient is pretty adamant about removing the VAC today.  Patient states that there is barely any drainage coming  from the [facility].  The patient denies any worsening in the wound status.  Patient denies any erythema to the lower extremity.  Patient denies any necrotic tissue to the area.  Patient denies any fever, nausea vomiting.  1. Hematoma of leg, right, subsequent encounter - Stable Recommend keeping the VAC intact as the patient's lower extremity is extremely edematous and I feel that even in unna wraps this fluid will interfere with wound healing and any progress we have made will regress. The patient is adamant about removing the back so I will go ahead and place 3 layer zinc oxide Unna wraps to the bilateral lower extremity.  They should be changed weekly by his visiting nurse or as necessary due to drainage.  Patient should continue to elevate his legs heart level or higher as much as possible. I encouraged the patient to please tell his visiting nurse to be in contact with our office to inform us if any regression in his wound healing status occurs. The patient and his son who was present understand that the amount of edema he has in his legs needs to be controlled because he is at risk for recurrent cellulitis and regression in his wound healing they both expressed their understanding I will see the patient back in 2 weeks for wound check  2. Lymphedema - Stable As above  Current Outpatient Medications on File Prior to Visit  Medication Sig Dispense Refill  . acetaminophen (TYLENOL) 325 MG tablet Take 2 tablets (650 mg total) by mouth every 6 (six) hours as needed for mild pain or fever.    Marland Kitchen aspirin EC 81 MG tablet Take 81 mg by mouth daily.    Marland Kitchen atorvastatin (LIPITOR) 40 MG  tablet Take 40 mg by mouth every evening.    . cephALEXin (KEFLEX) 500 MG capsule Take 1 capsule (500 mg total) by mouth every 12 (twelve) hours. 12 capsule 0  . Chlorpheniramine Maleate (ALLERGY PO) Take 1 tablet by mouth daily as needed (allergies).    . docusate sodium (COLACE) 100 MG capsule Take 200 mg by mouth at bedtime.     Marland Kitchen doxycycline (VIBRA-TABS) 100 MG tablet Take 1 tablet (100 mg total) by mouth every 12 (twelve) hours. 12 tablet 0  . fluticasone (FLONASE) 50 MCG/ACT nasal spray Place 1 spray into both nostrils daily.    Marland Kitchen gabapentin (NEURONTIN) 300 MG capsule Take 300 mg by mouth every morning.    . gabapentin (NEURONTIN) 400 MG capsule Take 400 mg by mouth at bedtime.    Marland Kitchen ipratropium-albuterol (DUONEB) 0.5-2.5 (3) MG/3ML SOLN Take 3 mLs by nebulization every 6 (six) hours as needed (shortness of breath).     . isosorbide mononitrate (IMDUR) 30 MG 24 hr tablet Take 30 mg by mouth daily.     Marland Kitchen ketotifen (ZADITOR) 0.025 % ophthalmic solution Place 1 drop into both eyes 2 (two) times daily. 5 mL 0  . lactose free nutrition (BOOST) LIQD Take 237 mLs by mouth daily.    . Multiple Vitamins-Minerals (CENTROVITE) TABS Take 1 tablet by mouth daily.    . nitroGLYCERIN (NITROSTAT) 0.3 MG SL tablet Place 0.3 mg under the tongue every 5 (five) minutes as needed for chest pain.    Marland Kitchen Potassium 99 MG TABS Take 99 mg by mouth daily.    . primidone (MYSOLINE) 50 MG tablet Take 200 mg by mouth 2 (two) times daily. 4 tabs    . Skin Protectants, Misc. (ENDIT EX) Apply topically to sacrum/buttocks 2 times  daily and as needed for irritation    . torsemide (DEMADEX) 20 MG tablet Take 20 mg by mouth daily.     Marland Kitchen umeclidinium-vilanterol (ANORO ELLIPTA) 62.5-25 MCG/INH AEPB Inhale 1 puff into the lungs daily.     No current facility-administered medications on file prior to visit.    There are no Patient Instructions on file for this visit. No follow-ups on file.  Kilee Hedding A Jonah Gingras, PA-C

## 2018-01-27 ENCOUNTER — Telehealth (INDEPENDENT_AMBULATORY_CARE_PROVIDER_SITE_OTHER): Payer: Self-pay

## 2018-01-27 NOTE — Telephone Encounter (Signed)
Called the patient's wife back to explain to her that the patient refused the wound vac at his last visit and that this is how he ended up in the unna wraps. She stated that the nurse from [facility] came out today and changed the wraps, and the nurse stated that his leg looked good. She will call back if anything changes. She is going to send the wound vac back.

## 2018-01-27 NOTE — Telephone Encounter (Signed)
Patient's wife called to see if the patient is still supposed to be applying the wound vac. She states that at the patient's last visit you took him off of the wound vac, back the home health nurse has orders to continue the vac for 2 more weeks?  They need some clarification of the wound vac orders, either to continue with the application of the vac, or to DC the wound vac. She states that [employer] would need new orders, should they have to continue with the wound vac, as the machine that they had was leased and they would have to be issued a new machine.

## 2018-02-02 ENCOUNTER — Telehealth (INDEPENDENT_AMBULATORY_CARE_PROVIDER_SITE_OTHER): Payer: Self-pay

## 2018-02-02 NOTE — Telephone Encounter (Signed)
Patient's wife called and stated that [facility].   They want to know if you received the pictures, and if the patient is required to keep his appointment with us on this coming Friday, as he appears to be healing up just fine according to the nurse?   I couldn't find where they emailed the photos in his chart.

## 2018-02-02 NOTE — Telephone Encounter (Signed)
Called the patient's wife back and she stated that they are going to cancel the appointment for this Friday, and if anything gets to be worse they will call us back to reschedule a follow up at that time. She went on to say that she had not yet setup the email through the MyChart yet, and that the pictures have not been sent because of this reason.

## 2018-02-05 DIAGNOSIS — M1712 Unilateral primary osteoarthritis, left knee: Secondary | ICD-10-CM | POA: Insufficient documentation

## 2018-02-06 ENCOUNTER — Ambulatory Visit (INDEPENDENT_AMBULATORY_CARE_PROVIDER_SITE_OTHER): Payer: Medicare Other | Admitting: Vascular Surgery

## 2018-03-10 ENCOUNTER — Ambulatory Visit (INDEPENDENT_AMBULATORY_CARE_PROVIDER_SITE_OTHER): Payer: Medicare Other | Admitting: Vascular Surgery

## 2018-03-17 ENCOUNTER — Encounter (INDEPENDENT_AMBULATORY_CARE_PROVIDER_SITE_OTHER): Payer: Self-pay | Admitting: Vascular Surgery

## 2018-03-17 ENCOUNTER — Ambulatory Visit (INDEPENDENT_AMBULATORY_CARE_PROVIDER_SITE_OTHER): Payer: Medicare Other | Admitting: Vascular Surgery

## 2018-03-17 VITALS — BP 150/87 | HR 76 | Resp 16 | Ht 70.0 in

## 2018-03-17 DIAGNOSIS — L97211 Non-pressure chronic ulcer of right calf limited to breakdown of skin: Secondary | ICD-10-CM

## 2018-03-17 DIAGNOSIS — I83012 Varicose veins of right lower extremity with ulcer of calf: Secondary | ICD-10-CM

## 2018-03-17 DIAGNOSIS — E785 Hyperlipidemia, unspecified: Secondary | ICD-10-CM

## 2018-03-17 DIAGNOSIS — I509 Heart failure, unspecified: Secondary | ICD-10-CM

## 2018-03-17 DIAGNOSIS — I10 Essential (primary) hypertension: Secondary | ICD-10-CM | POA: Diagnosis not present

## 2018-03-17 DIAGNOSIS — I890 Lymphedema, not elsewhere classified: Secondary | ICD-10-CM

## 2018-03-17 NOTE — Assessment & Plan Note (Signed)
Needs to use more compression and elevation but his control is currently pretty good

## 2018-03-17 NOTE — Progress Notes (Signed)
MRN : [id number]  Dylan [name] is a 82 y.o. (17-Feb-1936) male who presents with chief complaint of  Chief Complaint  Patient presents with  . Wound Check  .  History of Present Illness: Patient returns today in follow up of his chronic right leg ulceration.  There is now just a small scab without any drainage, erythema, or major open wound at this point.  This is markedly improved.  Current Outpatient Medications  Medication Sig Dispense Refill  . acetaminophen (TYLENOL) 325 MG tablet Take 2 tablets (650 mg total) by mouth every 6 (six) hours as needed for mild pain or fever.    Marland Kitchen aspirin EC 81 MG tablet Take 81 mg by mouth daily.    Marland Kitchen atorvastatin (LIPITOR) 40 MG tablet Take 40 mg by mouth every evening.    . cephALEXin (KEFLEX) 500 MG capsule Take 1 capsule (500 mg total) by mouth every 12 (twelve) hours. 12 capsule 0  . Chlorpheniramine Maleate (ALLERGY PO) Take 1 tablet by mouth daily as needed (allergies).    . docusate sodium (COLACE) 100 MG capsule Take 200 mg by mouth at bedtime.     Marland Kitchen doxycycline (VIBRA-TABS) 100 MG tablet Take 1 tablet (100 mg total) by mouth every 12 (twelve) hours. 12 tablet 0  . fluticasone (FLONASE) 50 MCG/ACT nasal spray Place 1 spray into both nostrils daily.    Marland Kitchen gabapentin (NEURONTIN) 300 MG capsule Take 300 mg by mouth every morning.    . gabapentin (NEURONTIN) 400 MG capsule Take 400 mg by mouth at bedtime.    Marland Kitchen ipratropium-albuterol (DUONEB) 0.5-2.5 (3) MG/3ML SOLN Take 3 mLs by nebulization every 6 (six) hours as needed (shortness of breath).     . isosorbide mononitrate (IMDUR) 30 MG 24 hr tablet Take 30 mg by mouth daily.     Marland Kitchen ketotifen (ZADITOR) 0.025 % ophthalmic solution Place 1 drop into both eyes 2 (two) times daily. 5 mL 0  . lactose free nutrition (BOOST) LIQD Take 237 mLs by mouth daily.    . Multiple Vitamins-Minerals (CENTROVITE) TABS Take 1 tablet by mouth daily.    . nitroGLYCERIN (NITROSTAT) 0.3 MG SL tablet Place 0.3 mg under the  tongue every 5 (five) minutes as needed for chest pain.    Marland Kitchen Potassium 99 MG TABS Take 99 mg by mouth daily.    . primidone (MYSOLINE) 50 MG tablet Take 200 mg by mouth 2 (two) times daily. 4 tabs    . Skin Protectants, Misc. (ENDIT EX) Apply topically to sacrum/buttocks 2 times daily and as needed for irritation    . torsemide (DEMADEX) 20 MG tablet Take 20 mg by mouth daily.     Marland Kitchen umeclidinium-vilanterol (ANORO ELLIPTA) 62.5-25 MCG/INH AEPB Inhale 1 puff into the lungs daily.     No current facility-administered medications for this visit.     Past Medical History:  Diagnosis Date  . CAD (coronary artery disease)   . Cervicalgia   . CHF (congestive heart failure) ([place])   . COPD (chronic obstructive pulmonary disease) ([place])   . Diastolic heart failure ([place])   . Foot drop, right   . History of kidney stones   . Hyperlipidemia    unspecified  . Hypertension   . Myocardial infarction ([place])   . Osteoarthritis   . Shoulder pain, left   . Sleep apnea   . Tremor, essential     Past Surgical History:  Procedure Laterality Date  . APPLICATION OF WOUND VAC Right  11/13/2017   Procedure: APPLICATION OF WOUND VAC;  Surgeon: Algernon Huxley, MD;  Location: [facility];  Service: Vascular;  Laterality: Right;  . BACK SURGERY  06/2010  . CARDIAC CATHETERIZATION Left 04/30/2016   Procedure: Left Heart Cath and Coronary Angiography;  Surgeon: Dionisio David, MD;  Location: [facility];  Service: Cardiovascular;  Laterality: Left;  . COLONOSCOPY    . CORONARY ANGIOPLASTY    . KNEE ARTHROSCOPY Left   . KNEE SURGERY Left 1998  . TONSILLECTOMY    . WOUND DEBRIDEMENT Right 11/13/2017   Procedure: DEBRIDEMENT WOUND;  Surgeon: Algernon Huxley, MD;  Location: [facility];  Service: Vascular;  Laterality: Right;    Social History Social History   Tobacco Use  . Smoking status: Light Tobacco Smoker    Packs/day: 0.25    Years: 58.00    Pack years: 14.50    Types: Cigarettes  . Smokeless tobacco:  Former Systems developer  . Tobacco comment: 1 cigarette per day  Substance Use Topics  . Alcohol use: No    Alcohol/week: 0.0 oz  . Drug use: No     Family History Family History  Problem Relation Age of Onset  . Diabetes Son   . Cancer Mother   . Colon cancer Mother   . Lung cancer Mother   . Tremor Father   . Heart disease Father   . Tremor Brother      No Known Allergies    REVIEW OF SYSTEMS (Negative unless checked)  Constitutional: '[]' Weight loss  '[]' Fever  '[]' Chills Cardiac: '[]' Chest pain   '[]' Chest pressure   '[]' Palpitations   '[]' Shortness of breath when laying flat   '[]' Shortness of breath at rest   '[]' Shortness of breath with exertion. Vascular:  '[]' Pain in legs with walking   '[]' Pain in legs at rest   '[]' Pain in legs when laying flat   '[]' Claudication   '[]' Pain in feet when walking  '[]' Pain in feet at rest  '[]' Pain in feet when laying flat   '[]' History of DVT   '[]' Phlebitis   '[x]' Swelling in legs   '[x]' Varicose veins   '[x]' Non-healing ulcers Pulmonary:   '[]' Uses home oxygen   '[]' Productive cough   '[]' Hemoptysis   '[]' Wheeze  '[]' COPD   '[]' Asthma Neurologic:  '[]' Dizziness  '[]' Blackouts   '[]' Seizures   '[]' History of stroke   '[]' History of TIA  '[]' Aphasia   '[]' Temporary blindness   '[]' Dysphagia   '[]' Weakness or numbness in arms   '[]' Weakness or numbness in legs Musculoskeletal:  '[x]' Arthritis   '[]' Joint swelling   '[]' Joint pain   '[]' Low back pain Hematologic:  '[]' Easy bruising  '[]' Easy bleeding   '[]' Hypercoagulable state   '[]' Anemic  '[]' Hepatitis Gastrointestinal:  '[]' Blood in stool   '[]' Vomiting blood  '[]' Gastroesophageal reflux/heartburn   '[]' Abdominal pain Genitourinary:  '[]' Chronic kidney disease   '[]' Difficult urination  '[]' Frequent urination  '[]' Burning with urination   '[]' Hematuria Skin:  '[]' Rashes   '[x]' Ulcers   '[x]' Wounds Psychological:  '[]' History of anxiety   '[]'  History of major depression.    Physical Examination  BP (!) 150/87 (BP Location: Left Arm)   Pulse 76   Resp 16   Ht '5\' 10"'  (1.778 m)   BMI 42.92 kg/m  Gen:   WD/WN, NAD Head: [place]/AT, No temporalis wasting. Ear/Nose/Throat: Hearing diminished, nares w/o erythema or drainage Eyes: Conjunctiva clear. Sclera non-icteric Neck: Supple.  Trachea midline Pulmonary:  Good air movement, no use of accessory muscles.  Cardiac: irregular Vascular:  Vessel Right Left  Radial Palpable Palpable  PT  not palpable  not palpable  DP  trace palpable  trace palpable    Musculoskeletal: M/S 5/5 throughout.  No deformity or atrophy.  Uses a wheelchair.  2+ bilateral lower extremity edema. Neurologic: Sensation grossly intact in extremities.  Symmetrical.  Speech is fluent.  Psychiatric: Judgment intact, Mood & affect appropriate for pt's clinical situation. Dermatologic: Approximately 2 cm circular scab on the right lateral mid calf area.  Markedly improved.       Labs Recent Results (from the past 2160 hour(s))  Comprehensive metabolic panel     Status: Abnormal   Collection Time: 01/10/18  8:39 PM  Result Value Ref Range   Sodium 141 135 - 145 mmol/L   Potassium 3.7 3.5 - 5.1 mmol/L   Chloride 103 101 - 111 mmol/L   CO2 30 22 - 32 mmol/L   Glucose, Bld 143 (H) 65 - 99 mg/dL   BUN 41 (H) 6 - 20 mg/dL   Creatinine, Ser 1.48 (H) 0.61 - 1.24 mg/dL   Calcium 8.9 8.9 - 10.3 mg/dL   Total Protein 6.6 6.5 - 8.1 g/dL   Albumin 3.2 (L) 3.5 - 5.0 g/dL   AST 69 (H) 15 - 41 U/L   ALT 73 (H) 17 - 63 U/L   Alkaline Phosphatase 109 38 - 126 U/L   Total Bilirubin 0.6 0.3 - 1.2 mg/dL   GFR calc non Af Amer 43 (L) >60 mL/min   GFR calc Af Amer 50 (L) >60 mL/min    Comment: (NOTE) The eGFR has been calculated using the CKD EPI equation. This calculation has not been validated in all clinical situations. eGFR's persistently <60 mL/min signify possible Chronic Kidney Disease.    Anion gap 8 5 - 15    Comment: Performed at [facility], [address] [place], [place] [address]  CBC WITH DIFFERENTIAL     Status: Abnormal     Collection Time: 01/10/18  8:39 PM  Result Value Ref Range   WBC 18.5 (H) 3.8 - 10.6 K/uL   RBC 3.94 (L) 4.40 - 5.90 MIL/uL   Hemoglobin 12.5 (L) 13.0 - 18.0 g/dL   HCT 37.4 (L) 40.0 - 52.0 %   MCV 94.9 80.0 - 100.0 fL   MCH 31.7 26.0 - 34.0 pg   MCHC 33.4 32.0 - 36.0 g/dL   RDW 16.5 (H) 11.5 - 14.5 %   Platelets 144 (L) 150 - 440 K/uL   Neutrophils Relative % 89 %   Neutro Abs 16.7 (H) 1.4 - 6.5 K/uL   Lymphocytes Relative 6 %   Lymphs Abs 1.1 1.0 - 3.6 K/uL   Monocytes Relative 4 %   Monocytes Absolute 0.7 0.2 - 1.0 K/uL   Eosinophils Relative 1 %   Eosinophils Absolute 0.1 0 - 0.7 K/uL   Basophils Relative 0 %   Basophils Absolute 0.0 0 - 0.1 K/uL    Comment: Performed at [facility], [address] [place], [place] [address]  Blood Culture (routine x 2)     Status: None   Collection Time: 01/10/18  8:39 PM  Result Value Ref Range   Specimen Description BLOOD LEFT FOREARM    Special Requests      BOTTLES DRAWN AEROBIC AND ANAEROBIC Blood Culture results may not be optimal due to an excessive volume of blood received in culture bottles   Culture      NO GROWTH 5 DAYS Performed at [facility], [place],  [place] [address]    Report Status 01/15/2018 FINAL   Lactic acid, plasma     Status: None   Collection Time: 01/10/18  8:39 PM  Result Value Ref Range   Lactic Acid, Venous 1.6 0.5 - 1.9 mmol/L    Comment: Performed at [facility], [address] [place], [place] [address]  Blood Culture (routine x 2)     Status: Abnormal   Collection Time: 01/10/18  8:42 PM  Result Value Ref Range   Specimen Description      BLOOD LEFT FOREARM MEDIAL Performed at [facility], [address], [place], [place] [address]    Special Requests      BOTTLES DRAWN AEROBIC AND ANAEROBIC Blood Culture results may not be optimal due to an excessive volume of blood received in culture bottles Performed at [facility], [address] [place], [place] [address]    Culture  Setup Time      GRAM [place] AT 9458 ON 01/12/18 [place].    Culture (A)     CORYNEBACTERIUM STRIATUM Standardized susceptibility testing for this organism is not available. Performed at [facility], [place] [address] [place], [place] [address]    Report Status 01/14/2018 FINAL   Urinalysis, Routine w reflex microscopic     Status: Abnormal   Collection Time: 01/10/18  8:42 PM  Result Value Ref Range   Color, Urine YELLOW (A) YELLOW   APPearance CLEAR (A) CLEAR   Specific Gravity, Urine 1.013 1.005 - 1.030   pH 7.0 5.0 - 8.0   Glucose, UA NEGATIVE NEGATIVE mg/dL   Hgb urine dipstick NEGATIVE NEGATIVE   Bilirubin Urine NEGATIVE NEGATIVE   Ketones, ur NEGATIVE NEGATIVE mg/dL   Protein, ur 30 (A) NEGATIVE mg/dL   Nitrite NEGATIVE NEGATIVE   Leukocytes, UA NEGATIVE NEGATIVE   RBC / HPF 0-5 0 - 5 RBC/hpf   WBC, UA 0-5 0 - 5 WBC/hpf   Bacteria, UA NONE SEEN NONE SEEN   Squamous Epithelial / LPF NONE SEEN 0 - 5    Comment: Performed at [facility], [address] [place], [place] [address]  Blood Culture ID Panel (Reflexed)     Status: None   Collection Time: 01/10/18  8:42 PM  Result Value Ref Range   Enterococcus species NOT DETECTED NOT DETECTED   Listeria monocytogenes NOT DETECTED NOT DETECTED   Staphylococcus species NOT DETECTED NOT DETECTED   Staphylococcus aureus NOT DETECTED NOT DETECTED   Streptococcus species NOT DETECTED NOT DETECTED   Streptococcus agalactiae NOT DETECTED NOT DETECTED   Streptococcus pneumoniae NOT DETECTED NOT DETECTED   Streptococcus pyogenes NOT DETECTED NOT DETECTED   Acinetobacter baumannii NOT DETECTED NOT DETECTED   Enterobacteriaceae species NOT DETECTED NOT DETECTED   Enterobacter cloacae complex NOT DETECTED NOT DETECTED   Escherichia coli NOT DETECTED NOT DETECTED   Klebsiella oxytoca NOT DETECTED NOT DETECTED   Klebsiella pneumoniae  NOT DETECTED NOT DETECTED   Proteus species NOT DETECTED NOT DETECTED   Serratia marcescens NOT DETECTED NOT DETECTED   Haemophilus influenzae NOT DETECTED NOT DETECTED   Neisseria meningitidis NOT DETECTED NOT DETECTED   Pseudomonas aeruginosa NOT DETECTED NOT DETECTED   Candida albicans NOT DETECTED NOT DETECTED   Candida glabrata NOT DETECTED NOT DETECTED   Candida krusei NOT DETECTED NOT DETECTED   Candida parapsilosis NOT DETECTED NOT DETECTED   Candida tropicalis NOT DETECTED NOT DETECTED    Comment: Performed at  [facility], [address], [place], [place] [address]  Lactic acid, plasma     Status: None   Collection Time: 01/11/18 12:30 AM  Result Value Ref Range   Lactic Acid, Venous 1.6 0.5 - 1.9 mmol/L    Comment: Performed at [facility], [address] [place], [place] [address]  Basic metabolic panel     Status: Abnormal   Collection Time: 01/11/18 12:30 AM  Result Value Ref Range   Sodium 140 135 - 145 mmol/L   Potassium 3.3 (L) 3.5 - 5.1 mmol/L   Chloride 105 101 - 111 mmol/L   CO2 28 22 - 32 mmol/L   Glucose, Bld 122 (H) 65 - 99 mg/dL   BUN 46 (H) 6 - 20 mg/dL   Creatinine, Ser 1.55 (H) 0.61 - 1.24 mg/dL   Calcium 8.3 (L) 8.9 - 10.3 mg/dL   GFR calc non Af Amer 41 (L) >60 mL/min   GFR calc Af Amer 47 (L) >60 mL/min    Comment: (NOTE) The eGFR has been calculated using the CKD EPI equation. This calculation has not been validated in all clinical situations. eGFR's persistently <60 mL/min signify possible Chronic Kidney Disease.    Anion gap 7 5 - 15    Comment: Performed at [facility], [address] [place], [place] [address]  CBC     Status: Abnormal   Collection Time: 01/11/18 12:30 AM  Result Value Ref Range   WBC 21.5 (H) 3.8 - 10.6 K/uL   RBC 3.67 (L) 4.40 - 5.90 MIL/uL   Hemoglobin 11.6 (L) 13.0 - 18.0 g/dL   HCT 34.8 (L) 40.0 - 52.0 %   MCV 94.6 80.0 - 100.0 fL   MCH 31.6 26.0 - 34.0 pg   MCHC 33.4 32.0 - 36.0  g/dL   RDW 16.1 (H) 11.5 - 14.5 %   Platelets 133 (L) 150 - 440 K/uL    Comment: Performed at [facility], [address] [place], [place] [address]  Glucose, capillary     Status: Abnormal   Collection Time: 01/11/18  7:47 AM  Result Value Ref Range   Glucose-Capillary 107 (H) 65 - 99 mg/dL   Comment 1 Notify RN   Glucose, capillary     Status: None   Collection Time: 01/12/18  7:27 AM  Result Value Ref Range   Glucose-Capillary 78 65 - 99 mg/dL  Basic metabolic panel     Status: Abnormal   Collection Time: 01/12/18  7:44 AM  Result Value Ref Range   Sodium 138 135 - 145 mmol/L   Potassium 3.7 3.5 - 5.1 mmol/L   Chloride 100 (L) 101 - 111 mmol/L   CO2 30 22 - 32 mmol/L   Glucose, Bld 100 (H) 65 - 99 mg/dL   BUN 43 (H) 6 - 20 mg/dL   Creatinine, Ser 1.65 (H) 0.61 - 1.24 mg/dL   Calcium 8.1 (L) 8.9 - 10.3 mg/dL   GFR calc non Af Amer 38 (L) >60 mL/min   GFR calc Af Amer 44 (L) >60 mL/min    Comment: (NOTE) The eGFR has been calculated using the CKD EPI equation. This calculation has not been validated in all clinical situations. eGFR's persistently <60 mL/min signify possible Chronic Kidney Disease.    Anion gap 8 5 - 15    Comment: Performed at [facility], [address] [place],  [address]  CBC     Status: Abnormal   Collection Time:  01/12/18  7:44 AM  Result Value Ref Range   WBC 9.5 3.8 - 10.6 K/uL   RBC 3.80 (L) 4.40 - 5.90 MIL/uL   Hemoglobin 12.1 (L) 13.0 - 18.0 g/dL   HCT 36.1 (L) 40.0 - 52.0 %   MCV 94.9 80.0 - 100.0 fL   MCH 31.8 26.0 - 34.0 pg   MCHC 33.5 32.0 - 36.0 g/dL   RDW 16.9 (H) 11.5 - 14.5 %   Platelets 128 (L) 150 - 440 K/uL    Comment: Performed at [facility], [address] [place], [place] [address]  Sedimentation rate     Status: Abnormal   Collection Time: 01/12/18  7:44 AM  Result Value Ref Range   Sed Rate 52 (H) 0 - 20 mm/hr    Comment: Performed at [facility], [address]  [place], [place] [address]  Glucose, capillary     Status: Abnormal   Collection Time: 01/12/18 11:39 AM  Result Value Ref Range   Glucose-Capillary 117 (H) 65 - 99 mg/dL  Vancomycin, trough     Status: Abnormal   Collection Time: 01/12/18  2:24 PM  Result Value Ref Range   Vancomycin Tr 22 (HH) 15 - 20 ug/mL    Comment: CRITICAL RESULT CALLED TO, READ BACK BY AND VERIFIED WITH [facility] '@[id number]'  01/12/18 [facility] Performed at [facility], [address] [place], [place] [address]   Basic metabolic panel     Status: Abnormal   Collection Time: 01/13/18  5:02 AM  Result Value Ref Range   Sodium 134 (L) 135 - 145 mmol/L   Potassium 3.4 (L) 3.5 - 5.1 mmol/L   Chloride 100 (L) 101 - 111 mmol/L   CO2 26 22 - 32 mmol/L   Glucose, Bld 97 65 - 99 mg/dL   BUN 37 (H) 6 - 20 mg/dL   Creatinine, Ser 1.30 (H) 0.61 - 1.24 mg/dL   Calcium 7.8 (L) 8.9 - 10.3 mg/dL   GFR calc non Af Amer 50 (L) >60 mL/min   GFR calc Af Amer 58 (L) >60 mL/min    Comment: (NOTE) The eGFR has been calculated using the CKD EPI equation. This calculation has not been validated in all clinical situations. eGFR's persistently <60 mL/min signify possible Chronic Kidney Disease.    Anion gap 8 5 - 15    Comment: Performed at [facility], [address] [place], [place] [address]  Glucose, capillary     Status: Abnormal   Collection Time: 01/13/18  7:38 AM  Result Value Ref Range   Glucose-Capillary 105 (H) 65 - 99 mg/dL  Glucose, capillary     Status: None   Collection Time: 01/14/18  7:41 AM  Result Value Ref Range   Glucose-Capillary 94 65 - 99 mg/dL   Comment 1 Notify RN     Radiology No results found.  Assessment/Plan CHF (congestive heart failure) ([place]) Could certainly exacerbate lower extremity swelling.  Hypertension blood pressure control important in reducing the progression of atherosclerotic disease. On appropriate oral medications.   Hyperlipidemia lipid control  important in reducing the progression of atherosclerotic disease. Continue statin therapy   Lymphedema Needs to use more compression and elevation but his control is currently pretty good  Varicose veins of lower extremities with ulcer ([place]) The ulcer at this point is basically just a scab.  It has healed to the point where if he is felt a candidate by the orthopedic surgeons, they can now  consider him for a left knee replacement.  The risk of infection at this point would be minimal as the wound is basically closed.  I will see him back as needed.    Leotis Pain, MD  03/17/2018 4:17 PM    This note was created with Dragon medical transcription system.  Any errors from dictation are purely unintentional

## 2018-03-17 NOTE — Assessment & Plan Note (Signed)
The ulcer at this point is basically just a scab.  It has healed to the point where if he is felt a candidate by the orthopedic surgeons, they can now consider him for a left knee replacement.  The risk of infection at this point would be minimal as the wound is basically closed.  I will see him back as needed.

## 2018-04-23 ENCOUNTER — Other Ambulatory Visit: Payer: Self-pay

## 2018-04-23 ENCOUNTER — Emergency Department
Admission: EM | Admit: 2018-04-23 | Discharge: 2018-04-23 | Disposition: A | Discharge status: Home / Self Care | Payer: Medicare Other | Attending: Emergency Medicine | Admitting: Emergency Medicine

## 2018-04-23 ENCOUNTER — Emergency Department: Payer: Medicare Other

## 2018-04-23 ENCOUNTER — Encounter: Payer: Self-pay | Admitting: *Deleted

## 2018-04-23 DIAGNOSIS — J449 Chronic obstructive pulmonary disease, unspecified: Secondary | ICD-10-CM | POA: Diagnosis not present

## 2018-04-23 DIAGNOSIS — F17210 Nicotine dependence, cigarettes, uncomplicated: Secondary | ICD-10-CM | POA: Diagnosis not present

## 2018-04-23 DIAGNOSIS — R2243 Localized swelling, mass and lump, lower limb, bilateral: Secondary | ICD-10-CM | POA: Diagnosis present

## 2018-04-23 DIAGNOSIS — I5033 Acute on chronic diastolic (congestive) heart failure: Secondary | ICD-10-CM | POA: Diagnosis not present

## 2018-04-23 DIAGNOSIS — I509 Heart failure, unspecified: Secondary | ICD-10-CM

## 2018-04-23 DIAGNOSIS — I110 Hypertensive heart disease with heart failure: Secondary | ICD-10-CM | POA: Diagnosis not present

## 2018-04-23 DIAGNOSIS — I2510 Atherosclerotic heart disease of native coronary artery without angina pectoris: Secondary | ICD-10-CM | POA: Insufficient documentation

## 2018-04-23 LAB — URINALYSIS, COMPLETE (UACMP) WITH MICROSCOPIC
BACTERIA UA: NONE SEEN
Bilirubin Urine: NEGATIVE
Glucose, UA: NEGATIVE mg/dL
Hgb urine dipstick: NEGATIVE
Ketones, ur: NEGATIVE mg/dL
Leukocytes, UA: NEGATIVE
Nitrite: NEGATIVE
PROTEIN: 100 mg/dL — AB
Specific Gravity, Urine: 1.019 (ref 1.005–1.030)
pH: 6 (ref 5.0–8.0)

## 2018-04-23 LAB — BASIC METABOLIC PANEL
Anion gap: 8 (ref 5–15)
BUN: 40 mg/dL — AB (ref 8–23)
CALCIUM: 9.2 mg/dL (ref 8.9–10.3)
CHLORIDE: 105 mmol/L (ref 98–111)
CO2: 30 mmol/L (ref 22–32)
CREATININE: 1.24 mg/dL (ref 0.61–1.24)
GFR calc Af Amer: >60 mL/min (ref >60)
GFR calc non Af Amer: 53 mL/min — ABNORMAL LOW (ref >60)
Glucose, Bld: 123 mg/dL — ABNORMAL HIGH (ref 70–99)
Potassium: 4.1 mmol/L (ref 3.5–5.1)
SODIUM: 143 mmol/L (ref 135–145)

## 2018-04-23 LAB — CBC
HCT: 39.3 % — ABNORMAL LOW (ref 40.0–52.0)
Hemoglobin: 13.2 g/dL (ref 13.0–18.0)
MCH: 32.1 pg (ref 26.0–34.0)
MCHC: 33.7 g/dL (ref 32.0–36.0)
MCV: 95.2 fL (ref 80.0–100.0)
Platelets: 182 10*3/uL (ref 150–440)
RBC: 4.13 MIL/uL — ABNORMAL LOW (ref 4.40–5.90)
RDW: 16.3 % — AB (ref 11.5–14.5)
WBC: 9 10*3/uL (ref 3.8–10.6)

## 2018-04-23 LAB — BRAIN NATRIURETIC PEPTIDE: B Natriuretic Peptide: 672 pg/mL — ABNORMAL HIGH (ref 0.0–100.0)

## 2018-04-23 LAB — TROPONIN I: Troponin I: 0.05 ng/mL (ref <0.03)

## 2018-04-23 MED ORDER — FUROSEMIDE 10 MG/ML IJ SOLN
60.0000 mg | Freq: Once | INTRAMUSCULAR | Status: AC
  Administered 2018-04-23: 60 mg via INTRAVENOUS
  Filled 2018-04-23: qty 6

## 2018-04-23 NOTE — Discharge Instructions (Addendum)
Please double your Torsemide dose to 40mg  for the next three days. Please make an appointment with your cardiologist for follow up.  Monitor yourself for symptoms of congestive heart failure including difficulty breathing, increasing swelling, chest pain, and return to the emergency department if you develop any concerning symptoms.

## 2018-04-23 NOTE — ED Notes (Signed)
Vasoline gauze applied to pt's arm and rewrapped with clean gauze.

## 2018-04-23 NOTE — ED Provider Notes (Signed)
[facility] Emergency Department Provider Note  ____________________________________________  Time seen: Approximately 7:30 PM  I have reviewed the triage vital signs and the nursing notes.   HISTORY  Chief Complaint Leg Swelling    HPI Dylan [name] is a 82 y.o. male a history of CHF presenting for lower extremity and scrotal swelling.  The patient reports that today he have to his torsemide dose because he was "tired of wetting on himself."  Throughout the day, he noted increasing lower extremity swelling and scrotal swelling.  He has not had any other symptoms including chest pain, palpitations, shortness of breath, orthopnea.  Yesterday, he did sustain a fall and when his family member tried to catch him it resulted in a right forearm skin tear that has been treated with cleaning, Vaseline gauze; he has no complaints about his abrasion and he did not have a syncopal episode.  Past Medical History:  Diagnosis Date  . CAD (coronary artery disease)   . Cervicalgia   . CHF (congestive heart failure) (HCC)   . COPD (chronic obstructive pulmonary disease) (HCC)   . Diastolic heart failure (HCC)   . Foot drop, right   . History of kidney stones   . Hyperlipidemia    unspecified  . Hypertension   . Myocardial infarction (HCC)   . Osteoarthritis   . Shoulder pain, left   . Sleep apnea   . Tremor, essential     Patient Active Problem List   Diagnosis Date Noted  . Sepsis (HCC) 01/10/2018  . Pressure injury of skin 12/02/2017  . Community acquired pneumonia 12/01/2017  . Pneumonia 12/01/2017  . Hypertension 09/23/2017  . Hyperlipidemia 09/23/2017  . Hematoma of leg, right, subsequent encounter 09/22/2017  . Blood blister 09/22/2017  . Weakness 08/31/2017  . Varicose veins of lower extremities with ulcer (HCC) 06/17/2016  . Chronic venous insufficiency 06/17/2016  . Lymphedema 06/17/2016  . Acute on chronic diastolic CHF (congestive heart failure)  (HCC) 06/10/2016  . CHF (congestive heart failure) (HCC) 12/15/2015    Past Surgical History:  Procedure Laterality Date  . APPLICATION OF WOUND VAC Right 11/13/2017   Procedure: APPLICATION OF WOUND VAC;  Surgeon: Annice Needy, MD;  Location: [facility] ORS;  Service: Vascular;  Laterality: Right;  . BACK SURGERY  06/2010  . CARDIAC CATHETERIZATION Left 04/30/2016   Procedure: Left Heart Cath and Coronary Angiography;  Surgeon: Laurier Nancy, MD;  Location: [facility] INVASIVE CV LAB;  Service: Cardiovascular;  Laterality: Left;  . COLONOSCOPY    . CORONARY ANGIOPLASTY    . KNEE ARTHROSCOPY Left   . KNEE SURGERY Left 1998  . TONSILLECTOMY    . WOUND DEBRIDEMENT Right 11/13/2017   Procedure: DEBRIDEMENT WOUND;  Surgeon: Annice Needy, MD;  Location: [facility] ORS;  Service: Vascular;  Laterality: Right;    Current Outpatient Rx  . Order #: [id number] Class: No Print  . Order #: [id number] Class: Historical Med  . Order #: [id number] Class: Historical Med  . Order #: [id number] Class: Normal  . Order #: [id number] Class: Historical Med  . Order #: [id number] Class: Historical Med  . Order #: [id number] Class: Normal  . Order #: [id number] Class: Historical Med  . Order #: [id number] Class: Historical Med  . Order #: [id number] Class: Historical Med  . Order #: [id number] Class: Historical Med  . Order #: [id number] Class: Historical Med  . Order #: [id number] Class: Normal  . Order #: [id number] Class: Historical Med  . Order #: [id number] Class: Historical Med  . Order #: [id number] Class: Historical Med  .  Order #: [id number] Class: Historical Med  . Order #: [id number] Class: Historical Med  . Order #: [id number] Class: Historical Med  . Order #: [id number] Class: Historical Med  . Order #: [id number] Class: No Print    Allergies Patient has no known allergies.  Family History  Problem Relation Age of Onset  . Diabetes Son   . Cancer Mother   . Colon cancer Mother   . Lung cancer Mother   . Tremor Father   . Heart disease  Father   . Tremor Brother     Social History Social History   Tobacco Use  . Smoking status: Light Tobacco Smoker    Packs/day: 0.25    Years: 58.00    Pack years: 14.50    Types: Cigarettes  . Smokeless tobacco: Former Neurosurgeon  . Tobacco comment: 1 cigarette per day  Substance Use Topics  . Alcohol use: No    Alcohol/week: 0.0 standard drinks  . Drug use: No    Review of Systems Constitutional: No fever/chills.  Lightheadedness or syncope. Eyes: No visual changes. ENT: No sore throat. No congestion or rhinorrhea. Cardiovascular: Denies chest pain. Denies palpitations. Respiratory: Denies shortness of breath.  No cough. Gastrointestinal: No abdominal pain.  No nausea, no vomiting.  No diarrhea.  No constipation. Genitourinary: Negative for dysuria.  Positive edema of the frontal perineum, and scrotum without any overlying erythema.  No Fluctuance or crepitus. Musculoskeletal: Negative for back pain. Skin: Negative for rash.  Positive skin tear. Neurological: Negative for headaches. No focal numbness, tingling or weakness.     ____________________________________________   PHYSICAL EXAM:  VITAL SIGNS: ED Triage Vitals [04/23/18 1612]  Enc Vitals Group     BP (!) 158/100     Pulse Rate 64     Resp 20     Temp 98.2 F (36.8 C)     Temp Source Oral     SpO2 98 %     Weight 300 lb (136.1 kg)     Height 5\' 10"  (1.778 m)     Head Circumference      Peak Flow      Pain Score 0     Pain Loc      Pain Edu?      Excl. in GC?     Constitutional: Alert and oriented.answers questions appropriately.  Chronically ill-appearing. Eyes: Conjunctivae are normal.  EOMI. No scleral icterus. Head: Atraumatic. Nose: No congestion/rhinnorhea. Mouth/Throat: Mucous membranes are moist.  Neck: No stridor.  Supple.  Mild JVD. Cardiovascular: Normal rate, regular rhythm. No murmurs, rubs or gallops.  Respiratory: Normal respiratory effort.  No accessory muscle use or retractions.  Lungs CTAB.  No wheezes, rales or ronchi. Gastrointestinal: Soft, nontender and nondistended.  No guarding or rebound.  No peritoneal signs. GU: Swelling over the perineum above the penis with mild swelling of the scrotum.  No overlying skin changes including discharge, rash or erythema.  No crepitus. Musculoskeletal: Positive bilateral lower extremity edema that is pitting to the proximal tibial shaft.  No ttp in the calves or palpable cords.  Negative Homan's sign. Neurologic:  A&Ox3.  Speech is clear.  Face and smile are symmetric.  EOMI.  Moves all extremities well. Skin:  Skin is warm, dry.  On the right forearm, the patient has superficial skin tear approximately 10cm that is c/d w/o surrounding erythema, or discharge. Psychiatric: Mood and affect are normal. Speech and behavior are normal.  Normal judgement.  ____________________________________________   LABS (all labs ordered are listed, but only  abnormal results are displayed)  Labs Reviewed  BASIC METABOLIC PANEL - Abnormal; Notable for the following components:      Result Value   Glucose, Bld 123 (*)    BUN 40 (*)    GFR calc non Af Amer 53 (*)    All other components within normal limits  CBC - Abnormal; Notable for the following components:   RBC 4.13 (*)    HCT 39.3 (*)    RDW 16.3 (*)    All other components within normal limits  BRAIN NATRIURETIC PEPTIDE - Abnormal; Notable for the following components:   B Natriuretic Peptide 672.0 (*)    All other components within normal limits  TROPONIN I - Abnormal; Notable for the following components:   Troponin I 0.05 (*)    All other components within normal limits  URINALYSIS, COMPLETE (UACMP) WITH MICROSCOPIC - Abnormal; Notable for the following components:   Color, Urine YELLOW (*)    APPearance CLEAR (*)    Protein, ur 100 (*)    All other components within normal limits   ____________________________________________  EKG  ED ECG REPORT I, Anne-Caroline  Sharma CovertNorman, the attending physician, personally viewed and interpreted this ECG.   Date: 04/23/2018  EKG Time: 2128  Rate: 65  Rhythm: normal sinus rhythm; RBBB  Axis: leftward  Intervals:prolonged Qtc  ST&T Change: No STEMI  Repeat EKG: This EKG is compared to 01/10/2018, and is grossly unchanged in morphology.  ____________________________________________  RADIOLOGY  Dg Chest 2 View  Result Date: 04/23/2018 CLINICAL DATA:  82 y/o M; swelling to the bilateral lower extremities and scrotum. EXAM: CHEST - 2 VIEW COMPARISON:  01/10/2018 chest radiograph FINDINGS: Stable cardiac silhouette given projection and technique. Aortic atherosclerosis with calcification. Reticular opacities. Trace right and small left pleural effusions. Patchy left basilar opacity. No pneumothorax. No acute osseous abnormality is evident. IMPRESSION: Interstitial pulmonary edema. Trace right and small left pleural effusions. Left basilar opacity is probably associated atelectasis, less likely pneumonia. Electronically Signed   By: Mitzi HansenLance  Furusawa-Stratton M.D.   On: 04/23/2018 19:42    ____________________________________________   PROCEDURES  Procedure(s) performed: None  Procedures  Critical Care performed: No ____________________________________________   INITIAL IMPRESSION / ASSESSMENT AND PLAN / ED COURSE  Pertinent labs & imaging results that were available during my care of the patient were reviewed by me and considered in my medical decision making (see chart for details).  82 y.o. with a history of CHF presenting with fluid overload after cutting his diuretic in half.  Overall, the patient is mildly hypertensive at 158/100 but otherwise has no other symptoms that would be concerning for ACS or MI, arrhythmia.  Or severe CHF exacerbation requiring admission.  His work-up in the emergency department is reassuring.  After Lasix, he has put out 700 cc of fluid.  His troponin is 0.05, which is in his  normal baseline.  He does have an elevated BNP in the 600s, similar to his prior BNP.  Regarding the patient's skin tear, we have redressed to the wound with Vaseline gauze.  At this time, the patient is feeling significantly better and I will have him follow-up with his primary care physician and his cardiologist closely.  We will also increase his torsemide for the next several days.  Return precautions were discussed.  ____________________________________________  FINAL CLINICAL IMPRESSION(S) / ED DIAGNOSES  Final diagnoses:  Acute on chronic congestive heart failure, unspecified heart failure type (HCC)         NEW  MEDICATIONS STARTED DURING THIS VISIT:  New Prescriptions   No medications on file      Rockne Menghini, MD 04/23/18 2147

## 2018-04-23 NOTE — ED Notes (Signed)
Pt prescribed 20mg  torsemide daily. Pt only taking 10mg  d/t "not wanting to keep peeing." Pt having swelling to bilateral lower extremities and scrotum. Pt not very ambulatory. States "my left knee is bone on bone and I have drop foot from a back surgery."

## 2018-04-23 NOTE — ED Triage Notes (Signed)
Pt to triage via wheelchair.  Pt has swelling to legs for several weeks. No chest pain or sob.  Pt alert speech clear.  Pt has abrasion to right knee and right forearm

## 2018-04-23 NOTE — ED Notes (Signed)
Patient transported to X-ray 

## 2018-04-26 NOTE — Discharge Instructions (Signed)
°  Instructions after Total Knee Replacement ° ° [name] P. Ercole Georg, Jr., M.D.    ° Dept. of Orthopaedics & Sports Medicine ° [facility] ° [address] ° [place], [place]  [address] ° Phone: [phone]   Fax: [phone] ° °  °DIET: °• Drink plenty of non-alcoholic fluids. °• Resume your normal diet. Include foods high in fiber. ° °ACTIVITY:  °• You may use crutches or a walker with weight-bearing as tolerated, unless instructed otherwise. °• You may be weaned off of the walker or crutches by your Physical Therapist.  °• Do NOT place pillows under the knee. Anything placed under the knee could limit your ability to straighten the knee.   °• Continue doing gentle exercises. Exercising will reduce the pain and swelling, increase motion, and prevent muscle weakness.   °• Please continue to use the TED compression stockings for 6 weeks. You may remove the stockings at night, but should reapply them in the morning. °• Do not drive or operate any equipment until instructed. ° °WOUND CARE:  °• Continue to use the PolarCare or ice packs periodically to reduce pain and swelling. °• You may bathe or shower after the staples are removed at the first office visit following surgery. ° °MEDICATIONS: °• You may resume your regular medications. °• Please take the pain medication as prescribed on the medication. °• Do not take pain medication on an empty stomach. °• You have been given a prescription for a blood thinner (Lovenox or Coumadin). Please take the medication as instructed. (NOTE: After completing a 2 week course of Lovenox, take one Enteric-coated aspirin once a day. This along with elevation will help reduce the possibility of phlebitis in your operated leg.) °• Do not drive or drink alcoholic beverages when taking pain medications. ° °CALL THE OFFICE FOR: °• Temperature above 101 degrees °• Excessive bleeding or drainage on the dressing. °• Excessive swelling, coldness, or paleness of the toes. °• Persistent  nausea and vomiting. ° °FOLLOW-UP:  °• You should have an appointment to return to the office in 10-14 days after surgery. °• Arrangements have been made for continuation of Physical Therapy (either home therapy or outpatient therapy). °  °

## 2018-05-12 ENCOUNTER — Other Ambulatory Visit (INDEPENDENT_AMBULATORY_CARE_PROVIDER_SITE_OTHER): Payer: Self-pay | Admitting: Vascular Surgery

## 2018-05-12 ENCOUNTER — Encounter (INDEPENDENT_AMBULATORY_CARE_PROVIDER_SITE_OTHER): Payer: Self-pay | Admitting: Vascular Surgery

## 2018-05-12 ENCOUNTER — Ambulatory Visit (INDEPENDENT_AMBULATORY_CARE_PROVIDER_SITE_OTHER): Payer: Medicare Other | Admitting: Vascular Surgery

## 2018-05-12 VITALS — BP 148/80 | HR 59 | Resp 17 | Ht 70.0 in | Wt 300.0 lb

## 2018-05-12 DIAGNOSIS — I890 Lymphedema, not elsewhere classified: Secondary | ICD-10-CM | POA: Diagnosis not present

## 2018-05-12 DIAGNOSIS — S8011XD Contusion of right lower leg, subsequent encounter: Secondary | ICD-10-CM

## 2018-05-12 DIAGNOSIS — I872 Venous insufficiency (chronic) (peripheral): Secondary | ICD-10-CM | POA: Diagnosis not present

## 2018-05-13 ENCOUNTER — Encounter (INDEPENDENT_AMBULATORY_CARE_PROVIDER_SITE_OTHER): Payer: Self-pay | Admitting: Vascular Surgery

## 2018-05-13 NOTE — Progress Notes (Signed)
Subjective:    Patient ID: Dylan [name], male    DOB: 28-Aug-1936, 82 y.o.   MRN: [id number] Chief Complaint  Patient presents with  . Follow-up    Check leg, knee surgery coming up   Patient presents in preparation for his upcoming left lower extremity knee replacement.  Patient requested an appointment to have the edema and his previous wound site examined in preparation for surgery.  The patient presents today without complaint.  The patient continues to engage in conservative therapy including wearing medical grade 1 compression socks, elevating his legs and trying to remain as active as possible.  The patient denies any worsening in his edema or new formation of any ulcers/wounds to the lower extremity.  The patient notes that the wound located to the lateral aspect of his right calf continues to heal and is now a scab.  He denies any drainage or discomfort from this area.  The patient denies any erythema to the bilateral legs.  The patient denies any fever, nausea vomiting.  Review of Systems  Constitutional: Negative.   HENT: Negative.   Eyes: Negative.   Respiratory: Negative.   Cardiovascular: Positive for leg swelling.  Gastrointestinal: Negative.   Endocrine: Negative.   Genitourinary: Negative.   Musculoskeletal: Negative.   Skin: Positive for wound.  Allergic/Immunologic: Negative.   Neurological: Negative.   Hematological: Negative.   Psychiatric/Behavioral: Negative.       Objective:   Physical Exam  Constitutional: He is oriented to person, place, and time. He appears well-developed and well-nourished. No distress.  HENT:  Head: Normocephalic and atraumatic.  Right Ear: External ear normal.  Left Ear: External ear normal.  Eyes: Pupils are equal, round, and reactive to light. Conjunctivae and EOM are normal.  Neck: Normal range of motion.  Cardiovascular: Normal rate, regular rhythm, normal heart sounds and intact distal pulses.  Pulses:      Radial pulses are  2+ on the right side, and 2+ on the left side.  Hard to palpate pedal pulses due to body habitus and some edema however the bilateral feet are warm.  Pulmonary/Chest: Effort normal and breath sounds normal.  Musculoskeletal: Normal range of motion. He exhibits edema (Minimal nonpitting edema noted to bilateral lower extremity.).  Neurological: He is alert and oriented to person, place, and time.  Skin: Skin is warm and dry. He is not diaphoretic.  There is no active cellulitis or ulcerations noted to the bilateral lower extremity. Right lower extremity: Lateral calf.  The hematoma is now resolved.  The wound is now healed.  There is a scab covering the lateral aspect of the calf.  Surrounding skin is healthy.  There is no erythema.  There is no discharge.  There is no necrotic tissue.  Psychiatric: He has a normal mood and affect. His behavior is normal. Judgment and thought content normal.  Vitals reviewed.  BP (!) 148/80 (BP Location: Right Arm, Patient Position: Sitting)   Pulse (!) 59   Resp 17   Ht 5\' 10"  (1.778 m)   Wt 300 lb (136.1 kg)   BMI 43.05 kg/m   Past Medical History:  Diagnosis Date  . CAD (coronary artery disease)   . Cervicalgia   . CHF (congestive heart failure) (HCC)   . COPD (chronic obstructive pulmonary disease) (HCC)   . Diastolic heart failure (HCC)   . Foot drop, right   . History of kidney stones   . Hyperlipidemia    unspecified  . Hypertension   .  Myocardial infarction (HCC)   . Osteoarthritis   . Shoulder pain, left   . Sleep apnea   . Tremor, essential    Social History   Socioeconomic History  . Marital status: Married    Spouse name: [name]  . Number of children: 2  . Years of education: 48  . Highest education level: 11th grade  Occupational History  . Occupation: retired  Engineer, production  . Financial resource strain: Not on file  . Food insecurity:    Worry: Not on file    Inability: Not on file  . Transportation needs:    Medical:  Not on file    Non-medical: Not on file  Tobacco Use  . Smoking status: Former Smoker    Packs/day: 0.25    Years: 58.00    Pack years: 14.50    Types: Cigarettes  . Smokeless tobacco: Former Neurosurgeon  . Tobacco comment: Quit 2 weeks ago  Substance and Sexual Activity  . Alcohol use: No    Alcohol/week: 0.0 standard drinks  . Drug use: No  . Sexual activity: Not on file  Lifestyle  . Physical activity:    Days per week: Not on file    Minutes per session: Not on file  . Stress: Not on file  Relationships  . Social connections:    Talks on phone: Not on file    Gets together: Not on file    Attends religious service: Not on file    Active member of club or organization: Not on file    Attends meetings of clubs or organizations: Not on file    Relationship status: Not on file  . Intimate partner violence:    Fear of current or ex partner: Not on file    Emotionally abused: Not on file    Physically abused: Not on file    Forced sexual activity: Not on file  Other Topics Concern  . Not on file  Social History Narrative   Full Code with HCPOA and living will   Married with 2 children   Former smoker   Alcohol - none   Smokeless tobacco - none   Past Surgical History:  Procedure Laterality Date  . APPLICATION OF WOUND VAC Right 11/13/2017   Procedure: APPLICATION OF WOUND VAC;  Surgeon: Annice Needy, MD;  Location: [facility] ORS;  Service: Vascular;  Laterality: Right;  . BACK SURGERY  06/2010  . CARDIAC CATHETERIZATION Left 04/30/2016   Procedure: Left Heart Cath and Coronary Angiography;  Surgeon: Laurier Nancy, MD;  Location: [facility] INVASIVE CV LAB;  Service: Cardiovascular;  Laterality: Left;  . COLONOSCOPY    . CORONARY ANGIOPLASTY    . KNEE ARTHROSCOPY Left   . KNEE SURGERY Left 1998  . TONSILLECTOMY    . WOUND DEBRIDEMENT Right 11/13/2017   Procedure: DEBRIDEMENT WOUND;  Surgeon: Annice Needy, MD;  Location: [facility] ORS;  Service: Vascular;  Laterality: Right;   Family  History  Problem Relation Age of Onset  . Diabetes Son   . Cancer Mother   . Colon cancer Mother   . Lung cancer Mother   . Tremor Father   . Heart disease Father   . Tremor Brother    No Known Allergies     Assessment & Plan:  Patient presents in preparation for his upcoming left lower extremity knee replacement.  Patient requested an appointment to have the edema and his previous wound site examined in preparation for surgery.  The patient presents  today without complaint.  The patient continues to engage in conservative therapy including wearing medical grade 1 compression socks, elevating his legs and trying to remain as active as possible.  The patient denies any worsening in his edema or new formation of any ulcers/wounds to the lower extremity.  The patient notes that the wound located to the lateral aspect of his right calf continues to heal and is now a scab.  He denies any drainage or discomfort from this area.  The patient denies any erythema to the bilateral legs.  The patient denies any fever, nausea vomiting.  1. Chronic venous insufficiency - Stable The patient's bilateral lower extremity edema is relatively controlled at this time. Encouraged the patient to be extremely diligent in wearing his compression socks, elevating his legs and remaining as active as possible in preparation for his upcoming surgery to decrease the amount of edema. This is his understanding  2. Lymphedema - Stable As above  3. Hematoma of leg, right, subsequent encounter - Stable Right calf hematoma has resolved Wound seems to have healed.  There is a dry noninfective scab over the area.  Surrounding skin is healthy.  There is no drainage. From a vascular surgery standpoint I do not feel that this area will be an issue for his upcoming surgery however the patient does understand that that would ultimately be up to the orthopedic surgeon at the time of his surgery. The patient is to call us at any  time if he notices any changes in his wound status.  Current Outpatient Medications on File Prior to Visit  Medication Sig Dispense Refill  . acetaminophen (TYLENOL) 325 MG tablet Take 2 tablets (650 mg total) by mouth every 6 (six) hours as needed for mild pain or fever.    Marland Kitchen aspirin EC 81 MG tablet Take 81 mg by mouth daily.    Marland Kitchen atorvastatin (LIPITOR) 40 MG tablet Take 40 mg by mouth every evening.    . carvedilol (COREG) 12.5 MG tablet     . Chlorpheniramine Maleate (ALLERGY PO) Take 1 tablet by mouth daily as needed (allergies).    . docusate sodium (COLACE) 100 MG capsule Take 200 mg by mouth at bedtime.     Marland Kitchen doxycycline (VIBRA-TABS) 100 MG tablet Take 1 tablet (100 mg total) by mouth every 12 (twelve) hours. 12 tablet 0  . fluticasone (FLONASE) 50 MCG/ACT nasal spray Place 1 spray into both nostrils daily.    Marland Kitchen gabapentin (NEURONTIN) 300 MG capsule Take 300 mg by mouth every morning.    . gabapentin (NEURONTIN) 400 MG capsule Take 400 mg by mouth at bedtime.    . hydrochlorothiazide (HYDRODIURIL) 25 MG tablet     . ipratropium-albuterol (DUONEB) 0.5-2.5 (3) MG/3ML SOLN Take 3 mLs by nebulization every 6 (six) hours as needed (shortness of breath).     . isosorbide mononitrate (IMDUR) 30 MG 24 hr tablet Take 30 mg by mouth daily.     Marland Kitchen ketotifen (ZADITOR) 0.025 % ophthalmic solution Place 1 drop into both eyes 2 (two) times daily. 5 mL 0  . lactose free nutrition (BOOST) LIQD Take 237 mLs by mouth daily.    Marland Kitchen lisinopril (PRINIVIL,ZESTRIL) 5 MG tablet     . Multiple Vitamins-Minerals (CENTROVITE) TABS Take 1 tablet by mouth daily.    . nitroGLYCERIN (NITROSTAT) 0.3 MG SL tablet Place 0.3 mg under the tongue every 5 (five) minutes as needed for chest pain.    Marland Kitchen Potassium 99 MG TABS Take 99  mg by mouth daily.    . potassium chloride SA (K-DUR,KLOR-CON) 20 MEQ tablet Take 1 tablet as needed with Lasix (furosemide).    . primidone (MYSOLINE) 50 MG tablet Take 200 mg by mouth 2 (two) times  daily. 4 tabs    . Skin Protectants, Misc. (ENDIT EX) Apply topically to sacrum/buttocks 2 times daily and as needed for irritation    . torsemide (DEMADEX) 20 MG tablet Take 20 mg by mouth daily.     Marland Kitchen umeclidinium-vilanterol (ANORO ELLIPTA) 62.5-25 MCG/INH AEPB Inhale 1 puff into the lungs daily.     No current facility-administered medications on file prior to visit.    There are no Patient Instructions on file for this visit. No follow-ups on file.  Sky Primo A Prerana Strayer, PA-C

## 2018-05-20 ENCOUNTER — Other Ambulatory Visit: Payer: Self-pay

## 2018-05-20 ENCOUNTER — Encounter
Admission: RE | Admit: 2018-05-20 | Discharge: 2018-05-20 | Disposition: A | Discharge status: Home / Self Care | Payer: Medicare Other | Source: Ambulatory Visit | Attending: Orthopedic Surgery | Admitting: Orthopedic Surgery

## 2018-05-20 DIAGNOSIS — Z01812 Encounter for preprocedural laboratory examination: Secondary | ICD-10-CM | POA: Insufficient documentation

## 2018-05-20 LAB — URINALYSIS, ROUTINE W REFLEX MICROSCOPIC
BILIRUBIN URINE: NEGATIVE
GLUCOSE, UA: NEGATIVE mg/dL
Hgb urine dipstick: NEGATIVE
KETONES UR: NEGATIVE mg/dL
LEUKOCYTES UA: NEGATIVE
NITRITE: NEGATIVE
PH: 6 (ref 5.0–8.0)
Protein, ur: 100 mg/dL — AB
Specific Gravity, Urine: 1.018 (ref 1.005–1.030)

## 2018-05-20 LAB — CBC
HEMATOCRIT: 38.3 % — AB (ref 40.0–52.0)
HEMOGLOBIN: 13.2 g/dL (ref 13.0–18.0)
MCH: 32.8 pg (ref 26.0–34.0)
MCHC: 34.4 g/dL (ref 32.0–36.0)
MCV: 95.4 fL (ref 80.0–100.0)
Platelets: 115 10*3/uL — ABNORMAL LOW (ref 150–440)
RBC: 4.01 MIL/uL — ABNORMAL LOW (ref 4.40–5.90)
RDW: 17.1 % — AB (ref 11.5–14.5)
WBC: 8.4 10*3/uL (ref 3.8–10.6)

## 2018-05-20 LAB — COMPREHENSIVE METABOLIC PANEL
ALK PHOS: 105 U/L (ref 38–126)
ALT: 44 U/L (ref 0–44)
AST: 38 U/L (ref 15–41)
Albumin: 3.4 g/dL — ABNORMAL LOW (ref 3.5–5.0)
Anion gap: 7 (ref 5–15)
BILIRUBIN TOTAL: 1 mg/dL (ref 0.3–1.2)
BUN: 36 mg/dL — AB (ref 8–23)
CALCIUM: 8.9 mg/dL (ref 8.9–10.3)
CO2: 31 mmol/L (ref 22–32)
CREATININE: 1.25 mg/dL — AB (ref 0.61–1.24)
Chloride: 104 mmol/L (ref 98–111)
GFR calc Af Amer: >60 mL/min (ref >60)
GFR, EST NON AFRICAN AMERICAN: 53 mL/min — AB (ref >60)
Glucose, Bld: 111 mg/dL — ABNORMAL HIGH (ref 70–99)
Potassium: 3.6 mmol/L (ref 3.5–5.1)
Sodium: 142 mmol/L (ref 135–145)
Total Protein: 6.8 g/dL (ref 6.5–8.1)

## 2018-05-20 LAB — SEDIMENTATION RATE: Sed Rate: 26 mm/hr — ABNORMAL HIGH (ref 0–20)

## 2018-05-20 LAB — TYPE AND SCREEN
ABO/RH(D): O NEG
Antibody Screen: NEGATIVE

## 2018-05-20 LAB — PROTIME-INR
INR: 1.18
PROTHROMBIN TIME: 14.9 s (ref 11.4–15.2)

## 2018-05-20 LAB — C-REACTIVE PROTEIN: CRP: 3.5 mg/dL — ABNORMAL HIGH (ref <1.0)

## 2018-05-20 LAB — SURGICAL PCR SCREEN
MRSA, PCR: NEGATIVE
STAPHYLOCOCCUS AUREUS: NEGATIVE

## 2018-05-20 NOTE — Pre-Procedure Instructions (Signed)
AS INSTRUCTED BY DR Sampson GoonFITZGERALD, REQUEST FOR CARDIAC CLEARANCE CALLED AND FAXED TO DR Milta DeitersS [facility]. PATIENT IN ED 04/23/18 CHF. FYI FAXED TO DR Ernest PineHOOTEN

## 2018-05-20 NOTE — Patient Instructions (Signed)
Your procedure is scheduled on: June 01, 2018 MONDAY Report to Day Surgery on the 2nd floor of the [facility]. To find out your arrival time, please call [phone] between 1PM - 3PM on: Friday May 29, 2018  REMEMBER: Instructions that are not followed completely may result in serious medical risk, up to and including death; or upon the discretion of your surgeon and anesthesiologist your surgery may need to be rescheduled.  Do not eat food after midnight the night before surgery.  No gum chewing, lozengers or hard candies.  You may however, drink CLEAR liquids up to 2 hours before you are scheduled to arrive for your surgery. Do not drink anything within 2 hours of the start of your surgery.  Clear liquids include: - water  - apple juice without pulp - gatorade - black coffee or tea (Do NOT add milk or creamers to the coffee or tea) Do NOT drink anything that is not on this list.  No Alcohol for 24 hours before or after surgery.  No Smoking including e-cigarettes for 24 hours prior to surgery.  No chewable tobacco products for at least 6 hours prior to surgery.  No nicotine patches on the day of surgery.  On the morning of surgery brush your teeth with toothpaste and water, you may rinse your mouth with mouthwash if you wish. Do not swallow any toothpaste or mouthwash.  Notify your doctor if there is any change in your medical condition (cold, fever, infection).  Do not wear jewelry, make-up, hairpins, clips or nail polish.  Do not wear lotions, powders, or perfumes. You may wear deodorant.  Do not shave 48 hours prior to surgery. Men may shave face and neck.  Contacts and dentures may not be worn into surgery.  Do not bring valuables to the hospital, including drivers license, insurance or credit cards.  [facility] is not responsible for any belongings or valuables.   TAKE THESE MEDICATIONS THE MORNING OF SURGERY: CARVEDILOL CLARITIN IF  NEEDED ISOSORBIDE PRIMIDONE DO NOT TAKE HYDROCHLOROTHIAZIDE, LISINOPRIL , TORSEMIDE   Use SAGE wipes as directed on instruction sheet.  Use inhalers on the day of surgery  Bring your C-PAP to the hospital with you in case you may have to spend the night.   Follow recommendations from Cardiologist, Pulmonologist or PCP regarding stopping Aspirin  Stop Anti-inflammatories (NSAIDS) such as Advil, Aleve, Ibuprofen, Motrin, Naproxen, Naprosyn and Aspirin based products such as Excedrin, Goodys Powder, BC Powder ON May 22, 2018. (May take Tylenol or Acetaminophen if needed.)  Stop ANY OVER THE COUNTER supplements until after surgery VITAMIN C (May continue Vitamin D, Vitamin B, and multivitamin AND POTASSIUM.)  Wear comfortable clothing (specific to your surgery type) to the hospital.  Plan for stool softeners for home use.  If you are being admitted to the hospital overnight, leave your suitcase in the car. After surgery it may be brought to your room.  If you are being discharged the day of surgery, you will not be allowed to drive home. You will need a responsible adult to drive you home and stay with you that night.   If you are taking public transportation, you will need to have a responsible adult with you. Please confirm with your physician that it is acceptable to use public transportation.   Please call [phone] if you have any questions about these instructions.

## 2018-05-20 NOTE — Pre-Procedure Instructions (Signed)
LABS FAXED TO DR HOOTEN 

## 2018-05-21 LAB — HEMOGLOBIN A1C
HEMOGLOBIN A1C: 5.8 % — AB (ref 4.8–5.6)
MEAN PLASMA GLUCOSE: 120 mg/dL

## 2018-05-21 NOTE — Pre-Procedure Instructions (Signed)
LABS FAXED TO DR HOOTEN 

## 2018-05-24 LAB — URINE CULTURE
Culture: >=100000 — AB
Special Requests: NORMAL

## 2018-05-25 NOTE — Pre-Procedure Instructions (Signed)
UC FAXED TO DR HOOTEN 

## 2018-05-26 NOTE — Pre-Procedure Instructions (Signed)
Urine culture results sent to Dr. Hooten for review. 

## 2018-05-27 ENCOUNTER — Other Ambulatory Visit: Payer: Self-pay

## 2018-05-27 ENCOUNTER — Encounter: Payer: Self-pay | Admitting: Oncology

## 2018-05-27 ENCOUNTER — Inpatient Hospital Stay: Payer: Medicare Other | Attending: Oncology | Admitting: Oncology

## 2018-05-27 ENCOUNTER — Inpatient Hospital Stay: Payer: Medicare Other

## 2018-05-27 VITALS — BP 149/78 | HR 58 | Temp 97.0°F | Resp 58

## 2018-05-27 DIAGNOSIS — D696 Thrombocytopenia, unspecified: Secondary | ICD-10-CM

## 2018-05-27 DIAGNOSIS — Z87891 Personal history of nicotine dependence: Secondary | ICD-10-CM | POA: Diagnosis not present

## 2018-05-27 DIAGNOSIS — Z8052 Family history of malignant neoplasm of bladder: Secondary | ICD-10-CM | POA: Diagnosis not present

## 2018-05-27 DIAGNOSIS — Z801 Family history of malignant neoplasm of trachea, bronchus and lung: Secondary | ICD-10-CM | POA: Insufficient documentation

## 2018-05-27 DIAGNOSIS — Z800 Family history of malignant neoplasm of digestive organs: Secondary | ICD-10-CM | POA: Insufficient documentation

## 2018-05-27 LAB — CBC WITH DIFFERENTIAL/PLATELET
BASOS PCT: 1 %
Basophils Absolute: 0.1 10*3/uL (ref 0–0.1)
EOS ABS: 0.3 10*3/uL (ref 0–0.7)
EOS PCT: 3 %
HCT: 39.4 % — ABNORMAL LOW (ref 40.0–52.0)
Hemoglobin: 13.4 g/dL (ref 13.0–18.0)
Lymphocytes Relative: 27 %
Lymphs Abs: 2.4 10*3/uL (ref 1.0–3.6)
MCH: 32.6 pg (ref 26.0–34.0)
MCHC: 34 g/dL (ref 32.0–36.0)
MCV: 95.8 fL (ref 80.0–100.0)
MONO ABS: 0.7 10*3/uL (ref 0.2–1.0)
MONOS PCT: 7 %
NEUTROS PCT: 62 %
Neutro Abs: 5.6 10*3/uL (ref 1.4–6.5)
PLATELETS: 143 10*3/uL — AB (ref 150–440)
RBC: 4.11 MIL/uL — ABNORMAL LOW (ref 4.40–5.90)
RDW: 16.9 % — AB (ref 11.5–14.5)
WBC: 9 10*3/uL (ref 3.8–10.6)

## 2018-05-27 LAB — VITAMIN B12: VITAMIN B 12: 675 pg/mL (ref 180–914)

## 2018-05-27 LAB — TECHNOLOGIST SMEAR REVIEW

## 2018-05-27 LAB — LACTATE DEHYDROGENASE: LDH: 138 U/L (ref 98–192)

## 2018-05-27 LAB — FOLATE: FOLATE: 26 ng/mL (ref >5.9)

## 2018-05-27 NOTE — Progress Notes (Signed)
Patient here for initial visit. He will having left total Knee replacement on 9/30. He went to preadmission for surger and they told him that blood platelets were on the low side and therefore Dr. Ernest Pine referred him to hematology.

## 2018-05-27 NOTE — Progress Notes (Signed)
Hematology/Oncology Consult note [facility] Telephone:([phone] [phone]   Patient Care Team: [name], MD as PCP - General (Internal Medicine)  REFERRING PROVIDER: Dr.Hooten CHIEF COMPLAINTS/REASON FOR VISIT:  Evaluation of thrombocytopenai  HISTORY OF PRESENTING ILLNESS:  Dylan [name] is a  82 y.o.  male with multiple comorbidities listed below who was referred to me for evaluation of thrombocytopenia.  Patient recently had lab work done which revealed thrombocytopenia, platelet counts 115,000.    Reviewed patient's previous labs, thrombocytopenia duration is chronic, dated back to 2011. Grade 1.  No aggravating or improving factors.  Associated symptoms: Patient denies fatigue, weight loss, easy bruising, hematochezia, hemoptysis.  History hepatitis or HIV infection. History of chronic liver disease Alcohol consumption: denies Denies any herbal supplements.   He has an upcoming left knee arthroplasty to be performed by Dr. Ernest Pine. There is concern about his thrombocytopenia and patient was referred to me for further evaluation.   Review of Systems  Constitutional: Negative for chills, fever, malaise/fatigue and weight loss.  HENT: Negative for nosebleeds and sore throat.   Eyes: Negative for double vision, photophobia and redness.  Respiratory: Negative for cough, shortness of breath and wheezing.   Cardiovascular: Positive for leg swelling. Negative for chest pain, palpitations and orthopnea.  Gastrointestinal: Negative for abdominal pain, blood in stool, nausea and vomiting.  Genitourinary: Negative for dysuria.  Musculoskeletal: Positive for joint pain. Negative for back pain, myalgias and neck pain.  Skin: Negative for itching and rash.  Neurological: Negative for dizziness, tingling and tremors.  Endo/Heme/Allergies: Negative for environmental allergies. Does not bruise/bleed easily.  Psychiatric/Behavioral: Negative for  depression.    MEDICAL HISTORY:  Past Medical History:  Diagnosis Date  . CAD (coronary artery disease)   . Cervicalgia   . CHF (congestive heart failure) (HCC)   . COPD (chronic obstructive pulmonary disease) (HCC)   . Diastolic heart failure (HCC)   . Foot drop, right   . History of kidney stones   . Hyperlipidemia    unspecified  . Hypertension   . Myocardial infarction (HCC)   . Osteoarthritis   . Shoulder pain, left   . Sleep apnea   . Tremor, essential     SURGICAL HISTORY: Past Surgical History:  Procedure Laterality Date  . APPLICATION OF WOUND VAC Right 11/13/2017   Procedure: APPLICATION OF WOUND VAC;  Surgeon: Annice Needy, MD;  Location: [facility] ORS;  Service: Vascular;  Laterality: Right;  . BACK SURGERY  06/2010  . CARDIAC CATHETERIZATION Left 04/30/2016   Procedure: Left Heart Cath and Coronary Angiography;  Surgeon: Laurier Nancy, MD;  Location: [facility] INVASIVE CV LAB;  Service: Cardiovascular;  Laterality: Left;  . COLONOSCOPY    . CORONARY ANGIOPLASTY    . KNEE ARTHROSCOPY Left   . KNEE SURGERY Left 1998  . TONSILLECTOMY    . WOUND DEBRIDEMENT Right 11/13/2017   Procedure: DEBRIDEMENT WOUND;  Surgeon: Annice Needy, MD;  Location: [facility] ORS;  Service: Vascular;  Laterality: Right;    SOCIAL HISTORY: Social History   Socioeconomic History  . Marital status: Married    Spouse name: [name]  . Number of children: 2  . Years of education: 26  . Highest education level: 11th grade  Occupational History  . Occupation: retired  Engineer, production  . Financial resource strain: Not on file  . Food insecurity:    Worry: Not on file    Inability: Not on file  . Transportation needs:  Medical: Not on file    Non-medical: Not on file  Tobacco Use  . Smoking status: Former Smoker    Packs/day: 0.25    Years: 58.00    Pack years: 14.50    Types: Cigarettes  . Smokeless tobacco: Former Neurosurgeon  . Tobacco comment: quit the begginning of septembet 2019  Substance  and Sexual Activity  . Alcohol use: No    Alcohol/week: 0.0 standard drinks  . Drug use: No  . Sexual activity: Not on file  Lifestyle  . Physical activity:    Days per week: Not on file    Minutes per session: Not on file  . Stress: Not on file  Relationships  . Social connections:    Talks on phone: Not on file    Gets together: Not on file    Attends religious service: Not on file    Active member of club or organization: Not on file    Attends meetings of clubs or organizations: Not on file    Relationship status: Not on file  . Intimate partner violence:    Fear of current or ex partner: Not on file    Emotionally abused: Not on file    Physically abused: Not on file    Forced sexual activity: Not on file  Other Topics Concern  . Not on file  Social History Narrative   Full Code with HCPOA and living will   Married with 2 children   Former smoker   Alcohol - none   Smokeless tobacco - none    FAMILY HISTORY: Family History  Problem Relation Age of Onset  . Diabetes Son   . Cancer Mother   . Colon cancer Mother   . Lung cancer Mother   . Tremor Father   . Heart disease Father   . Tremor Brother   . Bladder Cancer Brother   . Tremor Sister     ALLERGIES:  has No Known Allergies.  MEDICATIONS:  Current Outpatient Medications  Medication Sig Dispense Refill  . acetaminophen (TYLENOL) 500 MG tablet Take 1,000 mg by mouth 2 (two) times daily as needed for moderate pain.    . Ascorbic Acid (VITAMIN C PO) Take 1 tablet by mouth daily.    Marland Kitchen aspirin EC 81 MG tablet Take 81 mg by mouth daily.    Marland Kitchen atorvastatin (LIPITOR) 40 MG tablet Take 40 mg by mouth every evening.    . carvedilol (COREG) 12.5 MG tablet Take 12.5 mg by mouth 2 (two) times daily.     Marland Kitchen docusate sodium (COLACE) 100 MG capsule Take 300 mg by mouth at bedtime.     . gabapentin (NEURONTIN) 400 MG capsule Take 400 mg by mouth at bedtime.    Marland Kitchen guaiFENesin (MUCUS-ER PO) Take 1 tablet by mouth every 12  (twelve) hours as needed.    . hydrochlorothiazide (HYDRODIURIL) 25 MG tablet Take 25 mg by mouth daily.     Marland Kitchen ipratropium-albuterol (DUONEB) 0.5-2.5 (3) MG/3ML SOLN Take 3 mLs by nebulization every 6 (six) hours as needed (shortness of breath).     . isosorbide mononitrate (IMDUR) 30 MG 24 hr tablet Take 30 mg by mouth daily.     Marland Kitchen ketotifen (ZADITOR) 0.025 % ophthalmic solution Place 1 drop into both eyes 2 (two) times daily. 5 mL 0  . lactose free nutrition (BOOST) LIQD Take 237 mLs by mouth 3 (three) times a week.     Marland Kitchen lisinopril (PRINIVIL,ZESTRIL) 5 MG tablet Take 5 mg  by mouth daily.     Marland Kitchen loratadine (CLARITIN) 10 MG tablet Take 10 mg by mouth daily as needed for allergies.    . Multiple Vitamins-Minerals (CENTROVITE) TABS Take 1 tablet by mouth daily.    . nitroGLYCERIN (NITROSTAT) 0.3 MG SL tablet Place 0.3 mg under the tongue every 5 (five) minutes as needed for chest pain.    . potassium chloride (K-DUR,KLOR-CON) 10 MEQ tablet Take 10 mEq by mouth daily. May take an additional 10 meq dose as needed for swelling    . POTASSIUM GLUCONATE PO Take 1 capsule by mouth daily.    . primidone (MYSOLINE) 50 MG tablet Take 100 mg by mouth 2 (two) times daily.     Marland Kitchen torsemide (DEMADEX) 20 MG tablet Take 20 mg by mouth daily.     Marland Kitchen umeclidinium-vilanterol (ANORO ELLIPTA) 62.5-25 MCG/INH AEPB Inhale 1 puff into the lungs daily. (Patient taking differently: Inhale 1 puff into the lungs daily as needed (shortness of breath). )    . doxycycline (VIBRA-TABS) 100 MG tablet Take 1 tablet (100 mg total) by mouth every 12 (twelve) hours. (Patient not taking: Reported on 05/15/2018) 12 tablet 0   No current facility-administered medications for this visit.      PHYSICAL EXAMINATION: ECOG PERFORMANCE STATUS: 2 - Symptomatic, <50% confined to bed Vitals:   05/27/18 1117  BP: (!) 149/78  Pulse: (!) 58  Resp: (!) 58  Temp: (!) 97 F (36.1 C)   Filed Weights    Physical Exam  Constitutional: He is  oriented to person, place, and time. No distress.  Sitting in wheelchair.  Obese.   HENT:  Head: Normocephalic and atraumatic.  Mouth/Throat: Oropharynx is clear and moist.  Eyes: Pupils are equal, round, and reactive to light. EOM are normal. No scleral icterus.  Neck: Normal range of motion. Neck supple.  Cardiovascular: Normal rate, regular rhythm and normal heart sounds.  Pulmonary/Chest: Effort normal. No respiratory distress. He has no wheezes.  Abdominal: Soft. There is no tenderness.  Musculoskeletal: Normal range of motion. He exhibits edema. He exhibits no deformity.  Bilateral lower extremities swelling.    Neurological: He is alert and oriented to person, place, and time. No cranial nerve deficit. Coordination normal.  Skin: Skin is warm and dry. No rash noted. No erythema.  Psychiatric: He has a normal mood and affect. His behavior is normal. Thought content normal.     LABORATORY DATA:  I have reviewed the data as listed Lab Results  Component Value Date   WBC 9.0 05/27/2018   HGB 13.4 05/27/2018   HCT 39.4 (L) 05/27/2018   MCV 95.8 05/27/2018   PLT 143 (L) 05/27/2018   Recent Labs    12/01/17 1253  01/10/18 2039  01/13/18 0502 04/23/18 1615 05/20/18 1001  NA 138   < > 141   < > 134* 143 142  K 3.0*   < > 3.7   < > 3.4* 4.1 3.6  CL 100*   < > 103   < > 100* 105 104  CO2 28   < > 30   < > 26 30 31   GLUCOSE 160*   < > 143*   < > 97 123* 111*  BUN 53*   < > 41*   < > 37* 40* 36*  CREATININE 1.80*   < > 1.48*   < > 1.30* 1.24 1.25*  CALCIUM 8.3*   < > 8.9   < > 7.8* 9.2 8.9  GFRNONAA 34*   < >  43*   < > 50* 53* 53*  GFRAA 39*   < > 50*   < > 58* >60 >60  PROT 6.5  --  6.6  --   --   --  6.8  ALBUMIN 3.2*  --  3.2*  --   --   --  3.4*  AST 45*  --  69*  --   --   --  38  ALT 44  --  73*  --   --   --  44  ALKPHOS 93  --  109  --   --   --  105  BILITOT 0.7  --  0.6  --   --   --  1.0   < > = values in this interval not displayed.   Iron/TIBC/Ferritin/  %Sat No results found for: IRON, TIBC, FERRITIN, IRONPCTSAT   RADIOGRAPHIC STUDIES: I have personally reviewed the radiological images as listed and agreed with the findings in the report. 04/23/2018 CXR chest 2 view Interstitial pulmonary edema. Trace right and small left pleural effusions. Left basilar opacity is probably associated atelectasis, less likely pneumonia.   ASSESSMENT & PLAN:  1. Thrombocytopenia (HCC)    For the work up of patient's thrombocytopenia, I recommend checking CBC;CMP, LDH; smear review, folate, Vitamin B12, hepatitis, HIV,  flowcytometry Will also check ultrasound of the abdomen. Discussed with patient that from hematology aspect, platelet count above 100,000 is generally acceptable for invasive procedures. Repeat CBC showed 143,000.   # Patient follow-up with me after his knee replacement surgery.   Orders Placed This Encounter  Procedures  . US SPLEEN (ABDOMEN LIMITED)    Standing Status:   Future    Standing Expiration Date:   07/28/2019    Order Specific Question:   Reason for Exam (SYMPTOM  OR DIAGNOSIS REQUIRED)    Answer:   thrombocytopenia    Order Specific Question:   Preferred imaging location?    Answer:   [facility]  . CBC with Differential/Platelet    Standing Status:   Future    Number of Occurrences:   1    Standing Expiration Date:   05/28/2019  . Technologist smear review    Standing Status:   Future    Number of Occurrences:   1    Standing Expiration Date:   05/28/2019  . Hepatitis panel, acute    Standing Status:   Future    Number of Occurrences:   1    Standing Expiration Date:   05/28/2019  . HIV Antibody (routine testing w rflx)    Standing Status:   Future    Number of Occurrences:   1    Standing Expiration Date:   05/28/2019  . Flow cytometry panel-leukemia/lymphoma work-up    Standing Status:   Future    Number of Occurrences:   1    Standing Expiration Date:   05/28/2019  . Protein electrophoresis, serum     Standing Status:   Future    Number of Occurrences:   1    Standing Expiration Date:   05/28/2019  . Vitamin B12    Standing Status:   Future    Number of Occurrences:   1    Standing Expiration Date:   05/28/2019  . Folate    Standing Status:   Future    Number of Occurrences:   1    Standing Expiration Date:   05/28/2019  . Lactate dehydrogenase    Standing Status:  Future    Number of Occurrences:   1    Standing Expiration Date:   05/28/2019    All questions were answered. The patient knows to call the clinic with any problems questions or concerns.  Return of visit: 2 months Thank you for this kind referral and the opportunity to participate in the care of this patient. A copy of today's note is routed to referring provider  Total face to face encounter time for this patient visit was 45 min. >50% of the time was  spent in counseling and coordination of care.    Rickard Patience, MD, PhD Hematology Oncology [facility] Pager- [phone] 05/27/2018

## 2018-05-28 LAB — PROTEIN ELECTROPHORESIS, SERUM
A/G Ratio: 1.1 (ref 0.7–1.7)
Albumin ELP: 3.3 g/dL (ref 2.9–4.4)
Alpha-1-Globulin: 0.3 g/dL (ref 0.0–0.4)
Alpha-2-Globulin: 0.8 g/dL (ref 0.4–1.0)
Beta Globulin: 1.1 g/dL (ref 0.7–1.3)
GLOBULIN, TOTAL: 3.1 g/dL (ref 2.2–3.9)
Gamma Globulin: 1 g/dL (ref 0.4–1.8)
TOTAL PROTEIN ELP: 6.4 g/dL (ref 6.0–8.5)

## 2018-05-28 LAB — HEPATITIS PANEL, ACUTE
HCV Ab: <0.1 s/co ratio (ref 0.0–0.9)
Hep A IgM: NEGATIVE
Hep B C IgM: NEGATIVE
Hepatitis B Surface Ag: NEGATIVE

## 2018-05-28 LAB — HIV ANTIBODY (ROUTINE TESTING W REFLEX): HIV SCREEN 4TH GENERATION: NONREACTIVE

## 2018-05-28 NOTE — Pre-Procedure Instructions (Signed)
Cardiac and pulmonary clearances on chart.

## 2018-05-29 LAB — COMP PANEL: LEUKEMIA/LYMPHOMA

## 2018-05-31 MED ORDER — TRANEXAMIC ACID 1000 MG/10ML IV SOLN
1000.0000 mg | INTRAVENOUS | Status: DC
  Filled 2018-05-31: qty 10

## 2018-05-31 MED ORDER — DEXTROSE 5 % IV SOLN
3.0000 g | INTRAVENOUS | Status: DC
  Filled 2018-05-31: qty 3000

## 2018-06-01 ENCOUNTER — Inpatient Hospital Stay: Payer: Medicare Other | Admitting: Anesthesiology

## 2018-06-01 ENCOUNTER — Inpatient Hospital Stay
Admission: RE | Admit: 2018-06-01 | Discharge: 2018-06-04 | DRG: 470 | Disposition: A | Discharge status: Skilled Nursing Facility | Payer: Medicare Other | Attending: Orthopedic Surgery | Admitting: Orthopedic Surgery

## 2018-06-01 ENCOUNTER — Other Ambulatory Visit: Payer: Self-pay

## 2018-06-01 ENCOUNTER — Inpatient Hospital Stay: Payer: Medicare Other

## 2018-06-01 ENCOUNTER — Encounter
Admission: RE | Disposition: A | Discharge status: Skilled Nursing Facility | Payer: Self-pay | Source: Home / Self Care | Attending: Orthopedic Surgery

## 2018-06-01 ENCOUNTER — Encounter: Payer: Self-pay | Admitting: Orthopedic Surgery

## 2018-06-01 DIAGNOSIS — E785 Hyperlipidemia, unspecified: Secondary | ICD-10-CM | POA: Diagnosis present

## 2018-06-01 DIAGNOSIS — Z800 Family history of malignant neoplasm of digestive organs: Secondary | ICD-10-CM | POA: Diagnosis not present

## 2018-06-01 DIAGNOSIS — Z7982 Long term (current) use of aspirin: Secondary | ICD-10-CM | POA: Diagnosis not present

## 2018-06-01 DIAGNOSIS — I872 Venous insufficiency (chronic) (peripheral): Secondary | ICD-10-CM | POA: Diagnosis present

## 2018-06-01 DIAGNOSIS — J449 Chronic obstructive pulmonary disease, unspecified: Secondary | ICD-10-CM | POA: Diagnosis present

## 2018-06-01 DIAGNOSIS — G250 Essential tremor: Secondary | ICD-10-CM | POA: Diagnosis present

## 2018-06-01 DIAGNOSIS — G4730 Sleep apnea, unspecified: Secondary | ICD-10-CM | POA: Diagnosis present

## 2018-06-01 DIAGNOSIS — I110 Hypertensive heart disease with heart failure: Secondary | ICD-10-CM | POA: Diagnosis present

## 2018-06-01 DIAGNOSIS — M21371 Foot drop, right foot: Secondary | ICD-10-CM | POA: Diagnosis present

## 2018-06-01 DIAGNOSIS — I5032 Chronic diastolic (congestive) heart failure: Secondary | ICD-10-CM | POA: Diagnosis present

## 2018-06-01 DIAGNOSIS — I252 Old myocardial infarction: Secondary | ICD-10-CM

## 2018-06-01 DIAGNOSIS — Z801 Family history of malignant neoplasm of trachea, bronchus and lung: Secondary | ICD-10-CM | POA: Diagnosis not present

## 2018-06-01 DIAGNOSIS — Z23 Encounter for immunization: Secondary | ICD-10-CM

## 2018-06-01 DIAGNOSIS — Z79891 Long term (current) use of opiate analgesic: Secondary | ICD-10-CM | POA: Diagnosis not present

## 2018-06-01 DIAGNOSIS — Z79899 Other long term (current) drug therapy: Secondary | ICD-10-CM

## 2018-06-01 DIAGNOSIS — Z87442 Personal history of urinary calculi: Secondary | ICD-10-CM

## 2018-06-01 DIAGNOSIS — Z820 Family history of epilepsy and other diseases of the nervous system: Secondary | ICD-10-CM

## 2018-06-01 DIAGNOSIS — M1712 Unilateral primary osteoarthritis, left knee: Principal | ICD-10-CM | POA: Diagnosis present

## 2018-06-01 DIAGNOSIS — Z96659 Presence of unspecified artificial knee joint: Secondary | ICD-10-CM

## 2018-06-01 DIAGNOSIS — M25762 Osteophyte, left knee: Secondary | ICD-10-CM | POA: Diagnosis present

## 2018-06-01 DIAGNOSIS — Z6841 Body Mass Index (BMI) 40.0 and over, adult: Secondary | ICD-10-CM | POA: Diagnosis not present

## 2018-06-01 DIAGNOSIS — I2510 Atherosclerotic heart disease of native coronary artery without angina pectoris: Secondary | ICD-10-CM | POA: Diagnosis present

## 2018-06-01 HISTORY — PX: KNEE ARTHROPLASTY: SHX992

## 2018-06-01 LAB — APTT: aPTT: 40 s — ABNORMAL HIGH (ref 24–36)

## 2018-06-01 SURGERY — ARTHROPLASTY, KNEE, TOTAL, USING IMAGELESS COMPUTER-ASSISTED NAVIGATION
Anesthesia: Spinal | Laterality: Left

## 2018-06-01 MED ORDER — PROPOFOL 500 MG/50ML IV EMUL
INTRAVENOUS | Status: AC
  Filled 2018-06-01: qty 50

## 2018-06-01 MED ORDER — LIDOCAINE HCL (CARDIAC) PF 100 MG/5ML IV SOSY
PREFILLED_SYRINGE | INTRAVENOUS | Status: DC | PRN
  Administered 2018-06-01: 60 mg via INTRAVENOUS

## 2018-06-01 MED ORDER — BUPIVACAINE HCL (PF) 0.5 % IJ SOLN
INTRAMUSCULAR | Status: DC | PRN
  Administered 2018-06-01: 3 mL

## 2018-06-01 MED ORDER — METOCLOPRAMIDE HCL 5 MG/ML IJ SOLN: 5.0000 mg | Freq: Three times a day (TID) | INTRAMUSCULAR | Status: DC | PRN

## 2018-06-01 MED ORDER — CELECOXIB 200 MG PO CAPS
ORAL_CAPSULE | ORAL | Status: AC
  Administered 2018-06-01: 400 mg via ORAL
  Filled 2018-06-01: qty 2

## 2018-06-01 MED ORDER — PRIMIDONE 50 MG PO TABS
100.0000 mg | ORAL_TABLET | Freq: Two times a day (BID) | ORAL | Status: DC
  Administered 2018-06-02 – 2018-06-04 (×5): 100 mg via ORAL
  Filled 2018-06-01 (×7): qty 2

## 2018-06-01 MED ORDER — CELECOXIB 200 MG PO CAPS
200.0000 mg | ORAL_CAPSULE | Freq: Two times a day (BID) | ORAL | Status: DC
  Administered 2018-06-01 – 2018-06-04 (×6): 200 mg via ORAL
  Filled 2018-06-01 (×6): qty 1

## 2018-06-01 MED ORDER — KETOTIFEN FUMARATE 0.025 % OP SOLN
1.0000 [drp] | Freq: Two times a day (BID) | OPHTHALMIC | Status: DC
  Administered 2018-06-03: 1 [drp] via OPHTHALMIC
  Filled 2018-06-01: qty 5

## 2018-06-01 MED ORDER — ONDANSETRON HCL 4 MG/2ML IJ SOLN: 4.0000 mg | Freq: Once | INTRAMUSCULAR | Status: DC | PRN

## 2018-06-01 MED ORDER — HYDROMORPHONE HCL 1 MG/ML IJ SOLN: 0.5000 mg | INTRAMUSCULAR | Status: DC | PRN

## 2018-06-01 MED ORDER — ONDANSETRON HCL 4 MG/2ML IJ SOLN
4.0000 mg | Freq: Four times a day (QID) | INTRAMUSCULAR | Status: DC | PRN
  Administered 2018-06-02: 4 mg via INTRAVENOUS
  Filled 2018-06-01: qty 2

## 2018-06-01 MED ORDER — DEXAMETHASONE SODIUM PHOSPHATE 10 MG/ML IJ SOLN
INTRAMUSCULAR | Status: AC
  Administered 2018-06-01: 8 mg via INTRAVENOUS
  Filled 2018-06-01: qty 1

## 2018-06-01 MED ORDER — ISOSORBIDE MONONITRATE ER 30 MG PO TB24
30.0000 mg | ORAL_TABLET | Freq: Every day | ORAL | Status: DC
  Administered 2018-06-02 – 2018-06-04 (×3): 30 mg via ORAL
  Filled 2018-06-01 (×3): qty 1

## 2018-06-01 MED ORDER — CARVEDILOL 12.5 MG PO TABS
12.5000 mg | ORAL_TABLET | Freq: Two times a day (BID) | ORAL | Status: DC
  Administered 2018-06-02 – 2018-06-04 (×5): 12.5 mg via ORAL
  Filled 2018-06-01 (×5): qty 1

## 2018-06-01 MED ORDER — FAMOTIDINE 20 MG PO TABS
ORAL_TABLET | ORAL | Status: AC
  Administered 2018-06-01: 20 mg via ORAL
  Filled 2018-06-01: qty 1

## 2018-06-01 MED ORDER — PROPOFOL 500 MG/50ML IV EMUL
INTRAVENOUS | Status: DC | PRN
  Administered 2018-06-01: 25 ug/kg/min via INTRAVENOUS
  Administered 2018-06-01: 60 ug/kg/min via INTRAVENOUS

## 2018-06-01 MED ORDER — BOOST / RESOURCE BREEZE PO LIQD CUSTOM: 237.0000 mL | ORAL | Status: DC

## 2018-06-01 MED ORDER — BISACODYL 10 MG RE SUPP: 10.0000 mg | Freq: Every day | RECTAL | Status: DC | PRN

## 2018-06-01 MED ORDER — TRANEXAMIC ACID 1000 MG/10ML IV SOLN
INTRAVENOUS | Status: DC | PRN
  Administered 2018-06-01: 1000 mg via INTRAVENOUS

## 2018-06-01 MED ORDER — CELECOXIB 200 MG PO CAPS
400.0000 mg | ORAL_CAPSULE | Freq: Once | ORAL | Status: AC
  Administered 2018-06-01: 400 mg via ORAL

## 2018-06-01 MED ORDER — PHENOL 1.4 % MT LIQD
1.0000 | OROMUCOSAL | Status: DC | PRN
  Filled 2018-06-01: qty 177

## 2018-06-01 MED ORDER — BUPIVACAINE HCL (PF) 0.25 % IJ SOLN
INTRAMUSCULAR | Status: DC | PRN
  Administered 2018-06-01: 60 mL

## 2018-06-01 MED ORDER — NITROGLYCERIN 0.4 MG SL SUBL: 0.4000 mg | SUBLINGUAL_TABLET | SUBLINGUAL | Status: DC | PRN

## 2018-06-01 MED ORDER — DEXTROSE 5 % IV SOLN
INTRAVENOUS | Status: DC | PRN
  Administered 2018-06-01: 3 g via INTRAVENOUS

## 2018-06-01 MED ORDER — METOCLOPRAMIDE HCL 10 MG PO TABS
5.0000 mg | ORAL_TABLET | Freq: Three times a day (TID) | ORAL | Status: DC | PRN
  Administered 2018-06-01 (×2): 10 mg via ORAL

## 2018-06-01 MED ORDER — FENTANYL CITRATE (PF) 100 MCG/2ML IJ SOLN
INTRAMUSCULAR | Status: DC | PRN
  Administered 2018-06-01: 25 ug via INTRAVENOUS

## 2018-06-01 MED ORDER — LISINOPRIL 5 MG PO TABS
5.0000 mg | ORAL_TABLET | Freq: Every day | ORAL | Status: DC
  Administered 2018-06-01 – 2018-06-04 (×4): 5 mg via ORAL
  Filled 2018-06-01 (×4): qty 1

## 2018-06-01 MED ORDER — FLEET ENEMA 7-19 GM/118ML RE ENEM
1.0000 | ENEMA | Freq: Once | RECTAL | Status: AC | PRN
  Administered 2018-06-03: 1 via RECTAL

## 2018-06-01 MED ORDER — FERROUS SULFATE 325 (65 FE) MG PO TABS
325.0000 mg | ORAL_TABLET | Freq: Two times a day (BID) | ORAL | Status: DC
  Administered 2018-06-02 – 2018-06-04 (×4): 325 mg via ORAL
  Filled 2018-06-01 (×4): qty 1

## 2018-06-01 MED ORDER — ALUM & MAG HYDROXIDE-SIMETH 200-200-20 MG/5ML PO SUSP: 30.0000 mL | ORAL | Status: DC | PRN

## 2018-06-01 MED ORDER — DIPHENHYDRAMINE HCL 12.5 MG/5ML PO ELIX: 12.5000 mg | ORAL_SOLUTION | ORAL | Status: DC | PRN

## 2018-06-01 MED ORDER — POTASSIUM CHLORIDE CRYS ER 20 MEQ PO TBCR
10.0000 meq | EXTENDED_RELEASE_TABLET | Freq: Every day | ORAL | Status: DC
  Administered 2018-06-02 – 2018-06-04 (×3): 10 meq via ORAL
  Filled 2018-06-01 (×3): qty 1

## 2018-06-01 MED ORDER — TORSEMIDE 20 MG PO TABS
20.0000 mg | ORAL_TABLET | Freq: Every day | ORAL | Status: DC
  Administered 2018-06-01 – 2018-06-04 (×4): 20 mg via ORAL
  Filled 2018-06-01 (×4): qty 1

## 2018-06-01 MED ORDER — MAGNESIUM HYDROXIDE 400 MG/5ML PO SUSP
30.0000 mL | Freq: Every day | ORAL | Status: DC
  Administered 2018-06-02 – 2018-06-04 (×3): 30 mL via ORAL
  Filled 2018-06-01 (×3): qty 30

## 2018-06-01 MED ORDER — ATORVASTATIN CALCIUM 20 MG PO TABS
40.0000 mg | ORAL_TABLET | Freq: Every evening | ORAL | Status: DC
  Administered 2018-06-01 – 2018-06-03 (×3): 40 mg via ORAL
  Filled 2018-06-01 (×3): qty 2

## 2018-06-01 MED ORDER — FENTANYL CITRATE (PF) 100 MCG/2ML IJ SOLN
INTRAMUSCULAR | Status: AC
  Filled 2018-06-01: qty 2

## 2018-06-01 MED ORDER — GABAPENTIN 300 MG PO CAPS
ORAL_CAPSULE | ORAL | Status: AC
  Administered 2018-06-01: 300 mg via ORAL
  Filled 2018-06-01: qty 1

## 2018-06-01 MED ORDER — TRAMADOL HCL 50 MG PO TABS
50.0000 mg | ORAL_TABLET | ORAL | Status: DC | PRN
  Administered 2018-06-02: 50 mg via ORAL
  Filled 2018-06-01: qty 1

## 2018-06-01 MED ORDER — ENOXAPARIN SODIUM 30 MG/0.3ML ~~LOC~~ SOLN
30.0000 mg | Freq: Two times a day (BID) | SUBCUTANEOUS | Status: DC
  Administered 2018-06-02 – 2018-06-04 (×5): 30 mg via SUBCUTANEOUS
  Filled 2018-06-01 (×5): qty 0.3

## 2018-06-01 MED ORDER — ONDANSETRON HCL 4 MG PO TABS
4.0000 mg | ORAL_TABLET | Freq: Four times a day (QID) | ORAL | Status: DC | PRN
  Administered 2018-06-04: 4 mg via ORAL
  Filled 2018-06-01: qty 1

## 2018-06-01 MED ORDER — ACETAMINOPHEN 10 MG/ML IV SOLN
INTRAVENOUS | Status: DC | PRN
  Administered 2018-06-01: 1000 mg via INTRAVENOUS

## 2018-06-01 MED ORDER — VITAMIN C 500 MG PO TABS
500.0000 mg | ORAL_TABLET | Freq: Every day | ORAL | Status: DC
  Administered 2018-06-01 – 2018-06-04 (×4): 500 mg via ORAL
  Filled 2018-06-01 (×4): qty 1

## 2018-06-01 MED ORDER — CHLORHEXIDINE GLUCONATE 4 % EX LIQD: 60.0000 mL | Freq: Once | CUTANEOUS | Status: DC

## 2018-06-01 MED ORDER — SODIUM CHLORIDE 0.9 % IV SOLN
INTRAVENOUS | Status: DC | PRN
  Administered 2018-06-01: 35 ug/min via INTRAVENOUS

## 2018-06-01 MED ORDER — SENNOSIDES-DOCUSATE SODIUM 8.6-50 MG PO TABS
1.0000 | ORAL_TABLET | Freq: Two times a day (BID) | ORAL | Status: DC
  Administered 2018-06-01 – 2018-06-04 (×6): 1 via ORAL
  Filled 2018-06-01 (×6): qty 1

## 2018-06-01 MED ORDER — IPRATROPIUM-ALBUTEROL 0.5-2.5 (3) MG/3ML IN SOLN: 3.0000 mL | Freq: Four times a day (QID) | RESPIRATORY_TRACT | Status: DC | PRN

## 2018-06-01 MED ORDER — FENTANYL CITRATE (PF) 100 MCG/2ML IJ SOLN: 25.0000 ug | INTRAMUSCULAR | Status: DC | PRN

## 2018-06-01 MED ORDER — UMECLIDINIUM-VILANTEROL 62.5-25 MCG/INH IN AEPB
1.0000 | INHALATION_SPRAY | Freq: Every day | RESPIRATORY_TRACT | Status: DC
  Administered 2018-06-02 – 2018-06-03 (×2): 1 via RESPIRATORY_TRACT
  Filled 2018-06-01 (×2): qty 14

## 2018-06-01 MED ORDER — GABAPENTIN 300 MG PO CAPS
300.0000 mg | ORAL_CAPSULE | Freq: Once | ORAL | Status: AC
  Administered 2018-06-01: 300 mg via ORAL

## 2018-06-01 MED ORDER — OXYCODONE HCL 5 MG PO TABS
10.0000 mg | ORAL_TABLET | ORAL | Status: DC | PRN
  Administered 2018-06-01 – 2018-06-02 (×3): 10 mg via ORAL
  Filled 2018-06-01 (×3): qty 2

## 2018-06-01 MED ORDER — INFLUENZA VAC SPLIT HIGH-DOSE 0.5 ML IM SUSY
0.5000 mL | PREFILLED_SYRINGE | INTRAMUSCULAR | Status: AC
  Administered 2018-06-03: 0.5 mL via INTRAMUSCULAR
  Filled 2018-06-01: qty 0.5

## 2018-06-01 MED ORDER — KETAMINE HCL 50 MG/ML IJ SOLN
INTRAMUSCULAR | Status: AC
  Filled 2018-06-01: qty 10

## 2018-06-01 MED ORDER — ADULT MULTIVITAMIN W/MINERALS CH
ORAL_TABLET | Freq: Every day | ORAL | Status: DC
  Administered 2018-06-01 – 2018-06-04 (×4): 1 via ORAL
  Filled 2018-06-01 (×4): qty 1

## 2018-06-01 MED ORDER — NEOMYCIN-POLYMYXIN B GU 40-200000 IR SOLN
Status: DC | PRN
  Administered 2018-06-01: 14 mL

## 2018-06-01 MED ORDER — PANTOPRAZOLE SODIUM 40 MG PO TBEC
40.0000 mg | DELAYED_RELEASE_TABLET | Freq: Two times a day (BID) | ORAL | Status: DC
  Administered 2018-06-01 – 2018-06-04 (×6): 40 mg via ORAL
  Filled 2018-06-01 (×6): qty 1

## 2018-06-01 MED ORDER — GABAPENTIN 300 MG PO CAPS
300.0000 mg | ORAL_CAPSULE | Freq: Every day | ORAL | Status: DC
  Administered 2018-06-01 – 2018-06-03 (×3): 300 mg via ORAL
  Filled 2018-06-01 (×3): qty 1

## 2018-06-01 MED ORDER — METOCLOPRAMIDE HCL 10 MG PO TABS
10.0000 mg | ORAL_TABLET | Freq: Three times a day (TID) | ORAL | Status: AC
  Administered 2018-06-02 – 2018-06-03 (×6): 10 mg via ORAL
  Filled 2018-06-01 (×7): qty 1

## 2018-06-01 MED ORDER — GUAIFENESIN ER 600 MG PO TB12: 600.0000 mg | ORAL_TABLET | Freq: Two times a day (BID) | ORAL | Status: DC | PRN

## 2018-06-01 MED ORDER — FAMOTIDINE 20 MG PO TABS
20.0000 mg | ORAL_TABLET | Freq: Once | ORAL | Status: AC
  Administered 2018-06-01: 20 mg via ORAL

## 2018-06-01 MED ORDER — ACETAMINOPHEN 10 MG/ML IV SOLN
INTRAVENOUS | Status: AC
  Filled 2018-06-01: qty 100

## 2018-06-01 MED ORDER — LIDOCAINE HCL (PF) 2 % IJ SOLN
INTRAMUSCULAR | Status: AC
  Filled 2018-06-01: qty 10

## 2018-06-01 MED ORDER — SODIUM CHLORIDE 0.9 % IV SOLN
INTRAVENOUS | Status: DC
  Administered 2018-06-01: 19:00:00 via INTRAVENOUS

## 2018-06-01 MED ORDER — ACETAMINOPHEN 325 MG PO TABS: 325.0000 mg | ORAL_TABLET | Freq: Four times a day (QID) | ORAL | Status: DC | PRN

## 2018-06-01 MED ORDER — DEXAMETHASONE SODIUM PHOSPHATE 10 MG/ML IJ SOLN
8.0000 mg | Freq: Once | INTRAMUSCULAR | Status: AC
  Administered 2018-06-01: 8 mg via INTRAVENOUS

## 2018-06-01 MED ORDER — SODIUM CHLORIDE 0.9 % IV SOLN
INTRAVENOUS | Status: DC | PRN
  Administered 2018-06-01: 60 mL

## 2018-06-01 MED ORDER — CEFAZOLIN SODIUM-DEXTROSE 2-4 GM/100ML-% IV SOLN
2.0000 g | Freq: Four times a day (QID) | INTRAVENOUS | Status: AC
  Administered 2018-06-01 – 2018-06-02 (×3): 2 g via INTRAVENOUS
  Filled 2018-06-01 (×5): qty 100

## 2018-06-01 MED ORDER — HYDROCHLOROTHIAZIDE 25 MG PO TABS
25.0000 mg | ORAL_TABLET | Freq: Every day | ORAL | Status: DC
  Administered 2018-06-01 – 2018-06-04 (×4): 25 mg via ORAL
  Filled 2018-06-01 (×4): qty 1

## 2018-06-01 MED ORDER — ACETAMINOPHEN 10 MG/ML IV SOLN
1000.0000 mg | Freq: Four times a day (QID) | INTRAVENOUS | Status: AC
  Administered 2018-06-02 (×3): 1000 mg via INTRAVENOUS
  Filled 2018-06-01 (×5): qty 100

## 2018-06-01 MED ORDER — TRANEXAMIC ACID 1000 MG/10ML IV SOLN
1000.0000 mg | Freq: Once | INTRAVENOUS | Status: AC
  Administered 2018-06-01: 1000 mg via INTRAVENOUS
  Filled 2018-06-01: qty 1000

## 2018-06-01 MED ORDER — MENTHOL 3 MG MT LOZG
1.0000 | LOZENGE | OROMUCOSAL | Status: DC | PRN
  Filled 2018-06-01: qty 9

## 2018-06-01 MED ORDER — LACTATED RINGERS IV SOLN
INTRAVENOUS | Status: DC
  Administered 2018-06-01: 14:00:00 via INTRAVENOUS

## 2018-06-01 MED ORDER — LORATADINE 10 MG PO TABS: 10.0000 mg | ORAL_TABLET | Freq: Every day | ORAL | Status: DC | PRN

## 2018-06-01 MED ORDER — OXYCODONE HCL 5 MG PO TABS
5.0000 mg | ORAL_TABLET | ORAL | Status: DC | PRN
  Administered 2018-06-04: 5 mg via ORAL
  Filled 2018-06-01 (×2): qty 1

## 2018-06-01 SURGICAL SUPPLY — 74 items
ATTUNE MED DOME PAT 41 KNEE (Knees) ×2 IMPLANT
ATTUNE MED DOME PAT 41MM KNEE (Knees) ×1 IMPLANT
ATTUNE PS FEM LT SZ 8 CEM KNEE (Femur) ×3 IMPLANT
ATTUNE PSRP INSR SZ8 5 KNEE (Insert) ×2 IMPLANT
ATTUNE PSRP INSR SZ8 5MM KNEE (Insert) ×1 IMPLANT
BASE TIBIAL ATTUNE KNEE SZ9 (Knees) ×1 IMPLANT
BATTERY INSTRU NAVIGATION (MISCELLANEOUS) ×12 IMPLANT
BLADE SAW 70X12.5 (BLADE) ×3 IMPLANT
BLADE SAW 90X13X1.19 OSCILLAT (BLADE) ×3 IMPLANT
BLADE SAW 90X25X1.19 OSCILLAT (BLADE) ×3 IMPLANT
BONE CEMENT GENTAMICIN (Cement) ×6 IMPLANT
CANISTER SUCT 1200ML W/VALVE (MISCELLANEOUS) ×3 IMPLANT
CANISTER SUCT 3000ML PPV (MISCELLANEOUS) ×6 IMPLANT
CEMENT BONE GENTAMICIN 40 (Cement) ×2 IMPLANT
COOLER POLAR GLACIER W/PUMP (MISCELLANEOUS) ×3 IMPLANT
CUFF TOURN 24 STER (MISCELLANEOUS) IMPLANT
CUFF TOURN 30 STER DUAL PORT (MISCELLANEOUS) IMPLANT
CUFF TOURN 34 STER (MISCELLANEOUS) ×3 IMPLANT
DRAPE SHEET LG 3/4 BI-LAMINATE (DRAPES) ×3 IMPLANT
DRSG DERMACEA 8X12 NADH (GAUZE/BANDAGES/DRESSINGS) ×3 IMPLANT
DRSG OPSITE POSTOP 4X14 (GAUZE/BANDAGES/DRESSINGS) ×3 IMPLANT
DRSG TEGADERM 4X4.75 (GAUZE/BANDAGES/DRESSINGS) ×3 IMPLANT
DURAPREP 26ML APPLICATOR (WOUND CARE) ×6 IMPLANT
ELECT CAUTERY BLADE 6.4 (BLADE) ×3 IMPLANT
ELECT REM PT RETURN 9FT ADLT (ELECTROSURGICAL) ×3
ELECTRODE REM PT RTRN 9FT ADLT (ELECTROSURGICAL) ×1 IMPLANT
EX-PIN ORTHOLOCK NAV 4X150 (PIN) ×6 IMPLANT
GLOVE BIOGEL M STRL SZ7.5 (GLOVE) ×6 IMPLANT
GLOVE BIOGEL PI IND STRL 9 (GLOVE) ×1 IMPLANT
GLOVE BIOGEL PI INDICATOR 9 (GLOVE) ×2
GLOVE INDICATOR 8.0 STRL GRN (GLOVE) ×3 IMPLANT
GLOVE SURG SYN 9.0  PF PI (GLOVE) ×2
GLOVE SURG SYN 9.0 PF PI (GLOVE) ×1 IMPLANT
GOWN STRL REUS W/ TWL LRG LVL3 (GOWN DISPOSABLE) ×2 IMPLANT
GOWN STRL REUS W/TWL 2XL LVL3 (GOWN DISPOSABLE) ×3 IMPLANT
GOWN STRL REUS W/TWL LRG LVL3 (GOWN DISPOSABLE) ×4
HEMOVAC 400CC 10FR (MISCELLANEOUS) ×3 IMPLANT
HOLDER FOLEY CATH W/STRAP (MISCELLANEOUS) ×3 IMPLANT
HOOD PEEL AWAY FLYTE STAYCOOL (MISCELLANEOUS) ×6 IMPLANT
KIT TURNOVER KIT A (KITS) ×3 IMPLANT
KNIFE SCULPS 14X20 (INSTRUMENTS) ×3 IMPLANT
LABEL OR SOLS (LABEL) ×3 IMPLANT
NDL SAFETY ECLIPSE 18X1.5 (NEEDLE) ×1 IMPLANT
NEEDLE HYPO 18GX1.5 SHARP (NEEDLE) ×2
NEEDLE SPNL 20GX3.5 QUINCKE YW (NEEDLE) ×6 IMPLANT
NS IRRIG 500ML POUR BTL (IV SOLUTION) ×3 IMPLANT
PACK TOTAL KNEE (MISCELLANEOUS) ×3 IMPLANT
PAD WRAPON POLAR KNEE (MISCELLANEOUS) IMPLANT
PAD WRAPON POLOR MULTI XL (MISCELLANEOUS) ×1 IMPLANT
PIN DRILL QUICK PACK ×3 IMPLANT
PIN FIXATION 1/8DIA X 3INL (PIN) ×9 IMPLANT
PULSAVAC PLUS IRRIG FAN TIP (DISPOSABLE) ×3
SOL .9 NS 3000ML IRR  AL (IV SOLUTION) ×2
SOL .9 NS 3000ML IRR UROMATIC (IV SOLUTION) ×1 IMPLANT
SOL PREP PVP 2OZ (MISCELLANEOUS) ×3
SOLUTION PREP PVP 2OZ (MISCELLANEOUS) ×1 IMPLANT
SPONGE DRAIN TRACH 4X4 STRL 2S (GAUZE/BANDAGES/DRESSINGS) ×3 IMPLANT
STAPLER SKIN PROX 35W (STAPLE) ×3 IMPLANT
STRAP TIBIA SHORT (MISCELLANEOUS) ×3 IMPLANT
SUCTION FRAZIER HANDLE 10FR (MISCELLANEOUS) ×2
SUCTION TUBE FRAZIER 10FR DISP (MISCELLANEOUS) ×1 IMPLANT
SUT VIC AB 0 CT1 36 (SUTURE) ×3 IMPLANT
SUT VIC AB 1 CT1 36 (SUTURE) ×6 IMPLANT
SUT VIC AB 2-0 CT2 27 (SUTURE) ×3 IMPLANT
SYR 20CC LL (SYRINGE) ×3 IMPLANT
SYR 30ML LL (SYRINGE) ×6 IMPLANT
TIBIAL BASE ATTUNE KNEE SZ9 (Knees) ×3 IMPLANT
TIP FAN IRRIG PULSAVAC PLUS (DISPOSABLE) ×1 IMPLANT
TOWEL OR 17X26 4PK STRL BLUE (TOWEL DISPOSABLE) ×3 IMPLANT
TOWER CARTRIDGE SMART MIX (DISPOSABLE) ×3 IMPLANT
TRAY FOLEY MTR SLVR 16FR STAT (SET/KITS/TRAYS/PACK) ×3 IMPLANT
WRAP-ON POLOR PAD MULTI XL (MISCELLANEOUS) ×1
WRAPON POLAR PAD KNEE (MISCELLANEOUS)
WRAPON POLOR PAD MULTI XL (MISCELLANEOUS) ×2

## 2018-06-01 NOTE — Op Note (Signed)
OPERATIVE NOTE  DATE OF SURGERY:  06/01/2018  PATIENT NAME:  Dylan [name]   DOB: [birth year]  MRN: [id number]  PRE-OPERATIVE DIAGNOSIS: Degenerative arthrosis of the left knee, primary  POST-OPERATIVE DIAGNOSIS:  Same  PROCEDURE:  Left total knee arthroplasty using computer-assisted navigation  SURGEON:  Jena Gauss. M.D.  ASSISTANT:  Van Clines, PA (present and scrubbed throughout the case, critical for assistance with exposure, retraction, instrumentation, and closure)  ANESTHESIA: spinal  ESTIMATED BLOOD LOSS: 50 mL  FLUIDS REPLACED: 600 mL of crystalloid  TOURNIQUET TIME: 122 minutes  DRAINS: 2 medium Hemovac drains  SOFT TISSUE RELEASES: Anterior cruciate ligament, posterior cruciate ligament, deep medial collateral ligament, patellofemoral ligament, posterolateral corner, and pie crusting of the IT band.  IMPLANTS UTILIZED: DePuy Attune size 8 posterior stabilized femoral component (cemented), size 9 rotating platform tibial component (cemented), 41 mm medialized dome patella (cemented), and a 5 mm stabilized rotating platform polyethylene insert.  INDICATIONS FOR SURGERY: Dylan [name] is a 82 y.o. year old male with a long history of progressive knee pain. X-rays demonstrated severe degenerative changes in tricompartmental fashion. The patient had not seen any significant improvement despite conservative nonsurgical intervention. After discussion of the risks and benefits of surgical intervention, the patient expressed understanding of the risks benefits and agree with plans for total knee arthroplasty.   The risks, benefits, and alternatives were discussed at length including but not limited to the risks of infection, bleeding, nerve injury, stiffness, blood clots, the need for revision surgery, cardiopulmonary complications, among others, and they were willing to proceed.  PROCEDURE IN DETAIL: The patient was brought into the operating room and, after adequate spinal  anesthesia was achieved, a tourniquet was placed on the patient's upper thigh. The patient's knee and leg were cleaned and prepped with alcohol and DuraPrep and draped in the usual sterile fashion. A "timeout" was performed as per usual protocol. The lower extremity was exsanguinated using an Esmarch, and the tourniquet was inflated to 300 mmHg. An anterior longitudinal incision was made followed by a standard medial parapatellar approach.  The patient had a remote quadriceps tendon repair and there was a soft tissue lesion to the anterior aspect of the quadriceps tendon.  This was excised and noted to be somewhat like a lipoma.  The specimen was submitted for pathology.  The deep fibers of the medial collateral ligament were elevated in a subperiosteal fashion off of the medial flare of the tibia so as to maintain a continuous soft tissue sleeve. The patella was subluxed laterally and the patellofemoral ligament was incised. Inspection of the knee demonstrated severe degenerative changes with full-thickness loss of articular cartilage. Osteophytes were debrided using a rongeur. Anterior and posterior cruciate ligaments were excised. Two 4.0 mm Schanz pins were inserted in the femur and into the tibia for attachment of the array of trackers used for computer-assisted navigation. Hip center was identified using a circumduction technique. Distal landmarks were mapped using the computer. The distal femur and proximal tibia were mapped using the computer. The distal femoral cutting guide was positioned using computer-assisted navigation so as to achieve a 5 distal valgus cut. The femur was sized and it was felt that a size 8 femoral component was appropriate. A size 8 femoral cutting guide was positioned and the anterior cut was performed and verified using the computer. This was followed by completion of the posterior and chamfer cuts. Femoral cutting guide for the central box was then positioned in the center box  cut  was performed.  Attention was then directed to the proximal tibia. Medial and lateral menisci were excised. The extramedullary tibial cutting guide was positioned using computer-assisted navigation so as to achieve a 0 varus-valgus alignment and 3 posterior slope. The cut was performed and verified using the computer. The proximal tibia was sized and it was felt that a size 9 tibial tray was appropriate. Tibial and femoral trials were inserted followed by insertion of a 5 mm polyethylene insert. The knee was felt to be tight laterally.  The trial components were removed and the knee was brought into full extension and distracted using the Moreland retractors.  The posterolateral corner was carefully released using a combination of electrocautery and Metzenbaum scissors.  The IT band was pie crusted to allow for additional soft tissue balancing.  The extra medullary tibial cutting guide was reapplied so as to remove an additional 2 mm of bone from the proximal tibia. Trial components were reinserted followed by placement of a 5 mm polyethylene trial.  This allowed for excellent mediolateral soft tissue balancing both in flexion and in full extension. Finally, the patella was cut and prepared so as to accommodate a 41 mm medialized dome patella. A patella trial was placed and the knee was placed through a range of motion with excellent patellar tracking appreciated. The femoral trial was removed after debridement of posterior osteophytes. The central post-hole for the tibial component was reamed followed by insertion of a keel punch. Tibial trials were then removed. Cut surfaces of bone were irrigated with copious amounts of normal saline with antibiotic solution using pulsatile lavage and then suctioned dry. Polymethylmethacrylate cement with gentamicin was prepared in the usual fashion using a vacuum mixer. Cement was applied to the cut surface of the proximal tibia as well as along the undersurface of a size 9  rotating platform tibial component. Tibial component was positioned and impacted into place. Excess cement was removed using Personal assistant. Cement was then applied to the cut surfaces of the femur as well as along the posterior flanges of the size 8 femoral component. The femoral component was positioned and impacted into place. Excess cement was removed using Personal assistant. A 5 mm polyethylene trial was inserted and the knee was brought into full extension with steady axial compression applied. Finally, cement was applied to the backside of a 41 mm medialized dome patella and the patellar component was positioned and patellar clamp applied. Excess cement was removed using Personal assistant. After adequate curing of the cement, the tourniquet was deflated after a total tourniquet time of 122 minutes. Hemostasis was achieved using electrocautery. The knee was irrigated with copious amounts of normal saline with antibiotic solution using pulsatile lavage and then suctioned dry. 20 mL of 1.3% Exparel and 60 mL of 0.25% Marcaine in 40 mL of normal saline was injected along the posterior capsule, medial and lateral gutters, and along the arthrotomy site. A 5 mm stabilized rotating platform polyethylene insert was inserted and the knee was placed through a range of motion with excellent mediolateral soft tissue balancing appreciated and excellent patellar tracking noted. 2 medium drains were placed in the wound bed and brought out through separate stab incisions. The medial parapatellar portion of the incision was reapproximated using interrupted sutures of #1 Vicryl. Subcutaneous tissue was approximated in layers using first #0 Vicryl followed #2-0 Vicryl. The skin was approximated with skin staples. A sterile dressing was applied.  The patient tolerated the procedure well and was transported  to the recovery room in stable condition.    James P. Angie Fava., M.D.

## 2018-06-01 NOTE — Transfer of Care (Signed)
Immediate Anesthesia Transfer of Care Note  Patient: Dylan [name]  Procedure(s) Performed: COMPUTER ASSISTED TOTAL KNEE ARTHROPLASTY (Left )  Patient Location: PACU  Anesthesia Type:Spinal  Level of Consciousness: awake  Airway & Oxygen Therapy: Patient connected to face mask oxygen  Post-op Assessment: Post -op Vital signs reviewed and stable  Post vital signs: stable  Last Vitals:  Vitals Value Taken Time  BP    Temp    Pulse 63 06/01/2018  6:55 PM  Resp 14 06/01/2018  6:55 PM  SpO2 100 % 06/01/2018  6:55 PM  Vitals shown include unvalidated device data.  Last Pain:  Vitals:   06/01/18 1242  TempSrc: Temporal  PainSc: 0-No pain         Complications: No apparent anesthesia complications

## 2018-06-01 NOTE — Anesthesia Preprocedure Evaluation (Signed)
Anesthesia Evaluation  Patient identified by MRN, date of birth, ID band Patient awake    Reviewed: Allergy & Precautions, H&P , NPO status , Patient's Chart, lab work & pertinent test results, reviewed documented beta blocker date and time   History of Anesthesia Complications Negative for: history of anesthetic complications  Airway Mallampati: III  TM Distance: >3 FB Neck ROM: full    Dental  (+) Dental Advidsory Given, Poor Dentition   Pulmonary shortness of breath and with exertion, sleep apnea , COPD,  COPD inhaler, neg recent URI, Current Smoker, former smoker,           Cardiovascular Exercise Tolerance: Good hypertension, (-) angina+ CAD, + Past MI, + Cardiac Stents, + Peripheral Vascular Disease and +CHF  (-) CABG (-) dysrhythmias (-) Valvular Problems/Murmurs     Neuro/Psych negative neurological ROS  negative psych ROS   GI/Hepatic negative GI ROS, Neg liver ROS,   Endo/Other  neg diabetesMorbid obesity  Renal/GU negative Renal ROS  negative genitourinary   Musculoskeletal   Abdominal   Peds  Hematology negative hematology ROS (+)   Anesthesia Other Findings Past Medical History: No date: CAD (coronary artery disease) No date: Cervicalgia No date: CHF (congestive heart failure) (HCC) No date: COPD (chronic obstructive pulmonary disease) (HCC) No date: Diastolic heart failure (HCC) No date: Foot drop, right No date: History of kidney stones No date: Hyperlipidemia     Comment:  unspecified No date: Hypertension No date: Myocardial infarction (HCC) No date: Osteoarthritis No date: Shoulder pain, left No date: Sleep apnea No date: Tremor, essential   Reproductive/Obstetrics negative OB ROS                             Anesthesia Physical  Anesthesia Plan  ASA: III  Anesthesia Plan: Spinal   Post-op Pain Management:    Induction: Intravenous  PONV Risk Score and  Plan: 1 and Propofol infusion and TIVA  Airway Management Planned: Simple Face Mask and Natural Airway  Additional Equipment:   Intra-op Plan:   Post-operative Plan:   Informed Consent: I have reviewed the patients History and Physical, chart, labs and discussed the procedure including the risks, benefits and alternatives for the proposed anesthesia with the patient or authorized representative who has indicated his/her understanding and acceptance.   Dental Advisory Given  Plan Discussed with: Anesthesiologist, CRNA and Surgeon  Anesthesia Plan Comments:         Anesthesia Quick Evaluation

## 2018-06-01 NOTE — Anesthesia Post-op Follow-up Note (Signed)
Anesthesia QCDR form completed.        

## 2018-06-01 NOTE — OR Nursing (Signed)
Patient with 2+ pitting edema of left lower leg and both feet. Dr. Ernest Pine aware and has spoken to patient about the importance of wearing his stockings at all times. Dr. Karlton Lemon and Dr. Ernest Pine aware of PTT results.

## 2018-06-01 NOTE — H&P (Signed)
The patient has been re-examined, and the chart reviewed, and there have been no interval changes to the documented history and physical.    The risks, benefits, and alternatives have been discussed at length. The patient expressed understanding of the risks benefits and agreed with plans for surgical intervention.  Kennethia Lynes P. Harlow Basley, Jr. M.D.    

## 2018-06-01 NOTE — Anesthesia Procedure Notes (Signed)
Spinal  Patient location during procedure: OR Start time: 06/01/2018 2:46 PM End time: 06/01/2018 3:00 PM Staffing Resident/CRNA: , , CRNA Performed: resident/CRNA  Preanesthetic Checklist Completed: patient identified, site marked, surgical consent, pre-op evaluation, timeout performed, IV checked, risks and benefits discussed and monitors and equipment checked Spinal Block Patient position: sitting Prep: ChloraPrep Patient monitoring: heart rate, continuous pulse ox, blood pressure and cardiac monitor Approach: midline Location: L3-4 Injection technique: single-shot Needle Needle type: Introducer and Pencan  Needle gauge: 24 G Needle length: 10 cm Assessment Sensory level: T6 Additional Notes Negative paresthesia. Negative blood return. Positive free-flowing CSF. Expiration date of kit checked and confirmed. Patient tolerated procedure well, without complications.       

## 2018-06-02 ENCOUNTER — Encounter: Payer: Self-pay | Admitting: Orthopedic Surgery

## 2018-06-02 MED ORDER — ENOXAPARIN SODIUM 30 MG/0.3ML ~~LOC~~ SOLN: 30.0000 mg | Freq: Two times a day (BID) | SUBCUTANEOUS | 0 refills | Status: DC

## 2018-06-02 MED ORDER — TAMSULOSIN HCL 0.4 MG PO CAPS
0.4000 mg | ORAL_CAPSULE | Freq: Every day | ORAL | Status: DC
  Administered 2018-06-02 – 2018-06-04 (×3): 0.4 mg via ORAL
  Filled 2018-06-02 (×3): qty 1

## 2018-06-02 MED ORDER — OXYCODONE HCL 5 MG PO TABS: 5.0000 mg | ORAL_TABLET | ORAL | 0 refills | Status: DC | PRN

## 2018-06-02 MED ORDER — TRAMADOL HCL 50 MG PO TABS: 50.0000 mg | ORAL_TABLET | ORAL | 0 refills | Status: DC | PRN

## 2018-06-02 MED ORDER — SODIUM CHLORIDE 0.9 % IV BOLUS
250.0000 mL | Freq: Once | INTRAVENOUS | Status: AC
  Administered 2018-06-02: 250 mL via INTRAVENOUS

## 2018-06-02 NOTE — NC FL2 (Signed)
[place] MEDICAID FL2 LEVEL OF CARE SCREENING TOOL     IDENTIFICATION  Patient Name: Dylan [name] Birthdate: [birth year] Sex: male Admission Date (Current Location): 06/01/2018  [place] and [place] Number:  Chiropodist and Address:  [facility], [address] [place], [place] [address]      Provider Number: [id number]  Attending Physician Name and Address:  Donato Heinz, MD  Relative Name and Phone Number:       Current Level of Care: Hospital Recommended Level of Care: Skilled Nursing Facility Prior Approval Number:    Date Approved/Denied:   PASRR Number: [id number])  Discharge Plan: SNF    Current Diagnoses: Patient Active Problem List   Diagnosis Date Noted  . S/P total knee arthroplasty 06/01/2018  . Primary osteoarthritis of left knee 02/05/2018  . Sepsis (HCC) 01/10/2018  . Pressure injury of skin 12/02/2017  . Community acquired pneumonia 12/01/2017  . Pneumonia 12/01/2017  . Hypertension 09/23/2017  . Hyperlipidemia 09/23/2017  . Hematoma of leg, right, subsequent encounter 09/22/2017  . Blood blister 09/22/2017  . Weakness 08/31/2017  . Benign essential tremor 10/18/2016  . Class 3 severe obesity due to excess calories with serious comorbidity and body mass index (BMI) of 45.0 to 49.9 in adult (HCC) 10/18/2016  . Varicose veins of lower extremities with ulcer (HCC) 06/17/2016  . Chronic venous insufficiency 06/17/2016  . Lymphedema 06/17/2016  . Acute on chronic diastolic CHF (congestive heart failure) (HCC) 06/10/2016  . COPD, severe (HCC) 05/24/2016  . AV block, Mobitz 1 02/09/2016  . Coronary atherosclerosis of native coronary artery 02/09/2016  . ST elevation MI (STEMI) (HCC) 02/08/2016  . CHF (congestive heart failure) (HCC) 12/15/2015  . Right foot drop 04/07/2014  . Rotator cuff rupture, complete 04/22/2013  . Neck pain 04/14/2013    Orientation RESPIRATION BLADDER Height & Weight     Self, Time,  Situation, Place  Normal Continent Weight: (!) 302 lb 7.5 oz (137.2 kg) Height:  5\' 10"  (177.8 cm)  BEHAVIORAL SYMPTOMS/MOOD NEUROLOGICAL BOWEL NUTRITION STATUS      Continent Diet(Diet: Heart Healthy )  AMBULATORY STATUS COMMUNICATION OF NEEDS Skin   Extensive Assist Verbally Surgical wounds(Incision: Left Knee. )                       Personal Care Assistance Level of Assistance  Bathing, Feeding, Dressing Bathing Assistance: Limited assistance Feeding assistance: Independent Dressing Assistance: Limited assistance     Functional Limitations Info  Sight, Hearing, Speech Sight Info: Adequate Hearing Info: Impaired Speech Info: Adequate    SPECIAL CARE FACTORS FREQUENCY  PT (By licensed PT), OT (By licensed OT)     PT Frequency: (5) OT Frequency: (5)            Contractures      Additional Factors Info  Code Status, Allergies Code Status Info: (Full Code. ) Allergies Info: (No Known Allergies. )           Current Medications (06/02/2018):  This is the current hospital active medication list Current Facility-Administered Medications  Medication Dose Route Frequency Provider Last Rate Last Dose  . 0.9 %  sodium chloride infusion   Intravenous Continuous Hooten, Illene Labrador, MD 100 mL/hr at 06/01/18 1923    . acetaminophen (OFIRMEV) IV 1,000 mg  1,000 mg Intravenous Q6H Hooten, Illene Labrador, MD 400 mL/hr at 06/02/18 0728 1,000 mg at 06/02/18 0728  . acetaminophen (TYLENOL) tablet 325-650 mg  325-650 mg Oral  Q6H PRN Hooten, Illene Labrador, MD      . alum & mag hydroxide-simeth (MAALOX/MYLANTA) 200-200-20 MG/5ML suspension 30 mL  30 mL Oral Q4H PRN Hooten, Illene Labrador, MD      . atorvastatin (LIPITOR) tablet 40 mg  40 mg Oral QPM Hooten, Illene Labrador, MD   40 mg at 06/01/18 2118  . bisacodyl (DULCOLAX) suppository 10 mg  10 mg Rectal Daily PRN Hooten, Illene Labrador, MD      . carvedilol (COREG) tablet 12.5 mg  12.5 mg Oral BID Hooten, Illene Labrador, MD      . ceFAZolin (ANCEF) IVPB 2g/100 mL premix   2 g Intravenous Q6H Hooten, Illene Labrador, MD 200 mL/hr at 06/02/18 0505 2 g at 06/02/18 0505  . celecoxib (CELEBREX) capsule 200 mg  200 mg Oral BID Donato Heinz, MD   200 mg at 06/01/18 2118  . diphenhydrAMINE (BENADRYL) 12.5 MG/5ML elixir 12.5-25 mg  12.5-25 mg Oral Q4H PRN Hooten, Illene Labrador, MD      . enoxaparin (LOVENOX) injection 30 mg  30 mg Subcutaneous Q12H Hooten, Illene Labrador, MD      . feeding supplement (BOOST / RESOURCE BREEZE) liquid 1 Container  237 mL Oral Once per day on Mon Wed Fri Hooten, Illene Labrador, MD      . ferrous sulfate tablet 325 mg  325 mg Oral BID WC Hooten, Illene Labrador, MD      . gabapentin (NEURONTIN) capsule 300 mg  300 mg Oral QHS Hooten, Illene Labrador, MD   300 mg at 06/01/18 2118  . guaiFENesin (MUCINEX) 12 hr tablet 600 mg  600 mg Oral BID PRN Hooten, Illene Labrador, MD      . hydrochlorothiazide (HYDRODIURIL) tablet 25 mg  25 mg Oral Daily Hooten, Illene Labrador, MD   25 mg at 06/01/18 2118  . HYDROmorphone (DILAUDID) injection 0.5-1 mg  0.5-1 mg Intravenous Q4H PRN Hooten, Illene Labrador, MD      . Influenza vac split quadrivalent PF (FLUZONE HIGH-DOSE) injection 0.5 mL  0.5 mL Intramuscular Tomorrow-1000 Hooten, Illene Labrador, MD      . ipratropium-albuterol (DUONEB) 0.5-2.5 (3) MG/3ML nebulizer solution 3 mL  3 mL Nebulization Q6H PRN Hooten, Illene Labrador, MD      . isosorbide mononitrate (IMDUR) 24 hr tablet 30 mg  30 mg Oral Daily Hooten, Illene Labrador, MD      . ketotifen (ZADITOR) 0.025 % ophthalmic solution 1 drop  1 drop Both Eyes BID Hooten, Illene Labrador, MD      . lisinopril (PRINIVIL,ZESTRIL) tablet 5 mg  5 mg Oral Daily Hooten, Illene Labrador, MD   5 mg at 06/01/18 2126  . loratadine (CLARITIN) tablet 10 mg  10 mg Oral Daily PRN Hooten, Illene Labrador, MD      . magnesium hydroxide (MILK OF MAGNESIA) suspension 30 mL  30 mL Oral Daily Hooten, Illene Labrador, MD      . menthol-cetylpyridinium (CEPACOL) lozenge 3 mg  1 lozenge Oral PRN Hooten, Illene Labrador, MD       Or  . phenol (CHLORASEPTIC) mouth spray 1 spray  1 spray Mouth/Throat PRN  Hooten, Illene Labrador, MD      . metoCLOPramide (REGLAN) tablet 5-10 mg  5-10 mg Oral Q8H PRN Hooten, Illene Labrador, MD   10 mg at 06/01/18 2129   Or  . metoCLOPramide (REGLAN) injection 5-10 mg  5-10 mg Intravenous Q8H PRN Hooten, Illene Labrador, MD      . metoCLOPramide (REGLAN) tablet 10 mg  10 mg Oral TID AC & HS Hooten, Illene Labrador, MD      . multivitamin with minerals tablet   Oral Daily Hooten, Illene Labrador, MD   1 tablet at 06/01/18 2118  . nitroGLYCERIN (NITROSTAT) SL tablet 0.4 mg  0.4 mg Sublingual Q5 min PRN Hooten, Illene Labrador, MD      . ondansetron (ZOFRAN) tablet 4 mg  4 mg Oral Q6H PRN Hooten, Illene Labrador, MD       Or  . ondansetron (ZOFRAN) injection 4 mg  4 mg Intravenous Q6H PRN Hooten, Illene Labrador, MD      . oxyCODONE (Oxy IR/ROXICODONE) immediate release tablet 10 mg  10 mg Oral Q4H PRN Donato Heinz, MD   10 mg at 06/02/18 0606  . oxyCODONE (Oxy IR/ROXICODONE) immediate release tablet 5 mg  5 mg Oral Q4H PRN Hooten, Illene Labrador, MD      . pantoprazole (PROTONIX) EC tablet 40 mg  40 mg Oral BID Donato Heinz, MD   40 mg at 06/01/18 2118  . potassium chloride SA (K-DUR,KLOR-CON) CR tablet 10 mEq  10 mEq Oral Daily Hooten, Illene Labrador, MD      . primidone (MYSOLINE) tablet 100 mg  100 mg Oral BID Hooten, Illene Labrador, MD      . senna-docusate (Senokot-S) tablet 1 tablet  1 tablet Oral BID Donato Heinz, MD   1 tablet at 06/01/18 2126  . sodium phosphate (FLEET) 7-19 GM/118ML enema 1 enema  1 enema Rectal Once PRN Hooten, Illene Labrador, MD      . torsemide (DEMADEX) tablet 20 mg  20 mg Oral Daily Hooten, Illene Labrador, MD   20 mg at 06/01/18 2126  . traMADol (ULTRAM) tablet 50-100 mg  50-100 mg Oral Q4H PRN Hooten, Illene Labrador, MD      . umeclidinium-vilanterol (ANORO ELLIPTA) 62.5-25 MCG/INH 1 puff  1 puff Inhalation Daily Hooten, Illene Labrador, MD      . vitamin C (ASCORBIC ACID) tablet 500 mg  500 mg Oral Daily Hooten, Illene Labrador, MD   500 mg at 06/01/18 2122     Discharge Medications: Please see discharge summary for a list of discharge  medications.  Relevant Imaging Results:  Relevant Lab Results:   Additional Information (SSN: [id number])  Kamillah Didonato, Darleen Crocker, LCSW

## 2018-06-02 NOTE — Evaluation (Signed)
Occupational Therapy Evaluation Patient Details Name: Dylan [name] MRN: [id number] DOB: 15-Oct-1935 Today's Date: 06/02/2018    History of Present Illness Pt is an 82 y.o. male s/p L TKA 06/01/18.  Pitting edema noted L foot and R LE.  PMH includes CAD, cervicalgia, CHF, COPD, diastolic heart failure, R foot drop, htn, MI, L shoulder pain, L rotator cuff injury, back surgery, tremor, h/o wound vac R LE.   Clinical Impression   Pt seen for OT evaluation this date, POD#1 from above surgery. Pt was limited with household mobility (lift recliner<>BSC or w/c) and required assist for sponge bathing, LB dressing, med mgt, and meals from neighbor and spouse, prior to surgery. Pt with multiple falls with the L knee buckling/giving way during most falls. Pt is eager to return to PLOF with less pain and improved safety and independence. Pt currently requires max assist for LB dressing while in seated position due to pain and limited AROM of L knee. Pt required min assist for LLE mgt initially increasing to max assist for BLE mgt and trunk support during sup>sit EOB. Pt/son instructed in polar care mgt, falls prevention strategies, home/routines modifications, DME/AE for LB bathing and dressing tasks, and compression stocking mgt. Pt would benefit from skilled OT services including additional instruction in dressing techniques with or without assistive devices for dressing and bathing skills to support recall and carryover prior to discharge and ultimately to maximize safety, independence, and minimize falls risk and caregiver burden. Recommend pt continue therapy with STR upon discharge.    Follow Up Recommendations  SNF    Equipment Recommendations  Other (comment)(TBD)    Recommendations for Other Services       Precautions / Restrictions Precautions Precautions: Fall Precaution Comments: R AFO Required Braces or Orthoses: Knee Immobilizer - Left Knee Immobilizer - Left: Discontinue once straight leg  raise with < 10 degree lag Restrictions Weight Bearing Restrictions: Yes LLE Weight Bearing: Weight bearing as tolerated      Mobility Bed Mobility Overal bed mobility: Needs Assistance Bed Mobility: Supine to Sit     Supine to sit: HOB elevated;Min assist;Max assist Sit to supine: Mod assist;+2 for physical assistance;HOB elevated   General bed mobility comments: initial MIN assist for initiating sup>sit, about halfway through pt required max assist to complete coming to EOB for trunk support and BLE support  Transfers         General transfer comment: mobility deferred, PT in room to further assess    Balance Overall balance assessment: Needs assistance Sitting-balance support: Feet supported;Single extremity supported Sitting balance-Leahy Scale: Fair Sitting balance - Comments: requires at least single UE support for static sitting balance       Standing balance comment: unable to obtain standing to assess                           ADL either performed or assessed with clinical judgement   ADL Overall ADL's : Needs assistance/impaired Eating/Feeding: Sitting;Set up Eating/Feeding Details (indicate cue type and reason): PRN set up when tremors are worse Grooming: Sitting;Set up   Upper Body Bathing: Sitting;Minimal assistance;Moderate assistance   Lower Body Bathing: Sitting/lateral leans;Maximal assistance   Upper Body Dressing : Sitting;Minimal assistance   Lower Body Dressing: Sitting/lateral leans;Maximal assistance Lower Body Dressing Details (indicate cue type and reason): pt/family instructed in AE for LB dressing to improve safety and independence   Toilet Transfer Details (indicate cue type and reason): unsafe to  attempt                 Vision Baseline Vision/History: Wears glasses Wears Glasses: Reading only Patient Visual Report: No change from baseline       Perception     Praxis      Pertinent Vitals/Pain Pain  Assessment: No/denies pain(0/10 at rest beginning/end of OT session; increases with activity)     Hand Dominance Right   Extremity/Trunk Assessment Upper Extremity Assessment Upper Extremity Assessment: RUE deficits/detail;LUE deficits/detail RUE Deficits / Details: hx impaired shoulder ROM 3-/5, all else grossly WFL, hx of hand tremors RUE Coordination: decreased gross motor;decreased fine motor LUE Deficits / Details: hx impaired shoulder ROM 2+/5, all else grossly WFL, hx of hand tremors LUE Coordination: decreased gross motor;decreased fine motor   Lower Extremity Assessment Lower Extremity Assessment: Defer to PT evaluation;RLE deficits/detail;LLE deficits/detail RLE Deficits / Details: 0/5 DF (h/o drop foot); at least 3/5 AROM hip flexion and knee flexion/extension LLE Deficits / Details: able to perform L LE SLR with vc's for technique and positioning; at least 3/5 hip flexion and DF/PF LLE: Unable to fully assess due to pain   Cervical / Trunk Assessment Cervical / Trunk Assessment: Kyphotic   Communication Communication Communication: HOH   Cognition Arousal/Alertness: Awake/alert Behavior During Therapy: WFL for tasks assessed/performed Overall Cognitive Status: Within Functional Limits for tasks assessed                                     General Comments  hemovac in place, SCDs and polar care removed for PT session    Exercises Other Exercises Other Exercises: pt/family instructed in polar care mgt, compression stocking mgt, and home/routines modifications, and falls prevention strategies   Shoulder Instructions      Home Living Family/patient expects to be discharged to:: Skilled nursing facility Living Arrangements: Spouse/significant other Available Help at Discharge: Family;Neighbor Type of Home: House Home Access: Ramped entrance(ramp in front; 4 steps with L railing from back (hasn't used since January))     Home Layout: One level      Bathroom Shower/Tub: (pt takes sponge baths, doesn't use shower)   Bathroom Toilet: (uses BSC)     Home Equipment: Walker - 2 wheels;Cane - single point;Bedside commode;Shower seat;Grab bars - tub/shower;Wheelchair - manual;Other (comment)(Lift chair (recliner); adjustable bed with bed rail)          Prior Functioning/Environment Level of Independence: Needs assistance  Gait / Transfers Assistance Needed: Pt uses RW to perform stand pivot transfers from recliner lift chair to elevated BSC.  Needs assist to stand from manual w/c with elevated cushion.  Has not ambulated since earlier this year.  Has h/o falls in past 6 months d/t L knee buckling.  Sleeps in lift chair. ADL's / Homemaking Assistance Needed: Has assist from wife and neighbor that comes 5 days a week (check in on pt multiple times a day); assist with sponge bathing and LB dressing, has been able to perform SPTs to [facility] during the day. Neighbor will assist with lunchtime meal prep as needed. Wife sets up medications.    Comments: Uses R AFO for mobility.  Wife works.        OT Problem List: Decreased strength;Decreased knowledge of use of DME or AE;Decreased coordination;Obesity;Decreased activity tolerance;Impaired UE functional use;Impaired balance (sitting and/or standing);Pain      OT Treatment/Interventions: Self-care/ADL training;Balance training;Therapeutic exercise;Therapeutic activities;Energy conservation;DME and/or AE  instruction;Patient/family education    OT Goals(Current goals can be found in the care plan section) Acute Rehab OT Goals Patient Stated Goal: improve with mobility and have less pain OT Goal Formulation: With patient/family Time For Goal Achievement: 06/16/18 Potential to Achieve Goals: Good ADL Goals Pt Will Perform Lower Body Dressing: with mod assist;sit to/from stand;with adaptive equipment Pt Will Transfer to Toilet: with max assist;bedside commode;stand pivot transfer Additional ADL Goal  #1: Pt will independently instruct family/caregivers in compression stocking mgt, including donning/doffing, and positioning. Additional ADL Goal #2: Pt will independently instruct family/caregivers in polar care mgt, including donning/doffing, and positioning.  OT Frequency: Min 2X/week   Barriers to D/C:            Co-evaluation              AM-PAC PT "6 Clicks" Daily Activity     Outcome Measure Help from another person eating meals?: None Help from another person taking care of personal grooming?: None Help from another person toileting, which includes using toliet, bedpan, or urinal?: A Lot Help from another person bathing (including washing, rinsing, drying)?: A Lot Help from another person to put on and taking off regular upper body clothing?: A Little Help from another person to put on and taking off regular lower body clothing?: A Lot 6 Click Score: 17   End of Session    Activity Tolerance: Patient tolerated treatment well Patient left: in bed;with call bell/phone within reach;with family/visitor present;Other (comment)(Sitting EOB, w/ PT for session)  OT Visit Diagnosis: Other abnormalities of gait and mobility (R26.89);Muscle weakness (generalized) (M62.81);Repeated falls (R29.6);Pain Pain - Right/Left: Left Pain - part of body: Knee                Time: 1310-1354 OT Time Calculation (min): 44 min Charges:  OT General Charges $OT Visit: 1 Visit OT Evaluation $OT Eval Moderate Complexity: 1 Mod OT Treatments $Self Care/Home Management : 23-37 mins  Richrd Prime, MPH, MS, OTR/L ascom [phone] 06/02/18, 3:15 PM

## 2018-06-02 NOTE — Clinical Social Work Note (Addendum)
Clinical Social Work Assessment  Patient Details  Name: Dylan [name] MRN: [id number] Date of Birth: [birth year]  Date of referral:  06/02/18               Reason for consult:  Facility Placement                Permission sought to share information with:  Dylan [name] granted to share information::  Yes, Verbal Permission Granted  Name::      Dylan::   [name]   Relationship::     Contact Information:     Housing/Transportation Living arrangements for the past 2 months:  [place] of Information:  Patient, Adult Children, Spouse Patient Interpreter Needed:  None Criminal Activity/Legal Involvement Pertinent to Current Situation/Hospitalization:  No - Comment as needed Significant Relationships:  Adult Children, Spouse Lives with:  Spouse Do you feel safe going back to the place where you live?  Yes Need for family participation in patient care:  Yes (Comment)  Care giving concerns:  Patient lives in [place] with his wife Dylan [name] [phone].    Social Worker assessment / plan:  Holiday representative (CSW) received SNF consult. PT is recommending SNF. CSW met with patient and his son Dylan [name] was at bedside. Patient was alert and oriented X4 and was laying in the bed. CSW is familiar with patient and his son from joint class. CSW introduced self and explained role of CSW department. Per patient he lives in [place] with his wife and she will assist with the Dylan [name]. CSW explained that PT is recommending SNF and that Dylan [name] will have to approve it. Patient is agreeable to SNF search in Dylan [name]. [employer] compete and faxed out.   CSW presented bed offers to patient. Patient chose Dylan [name]. CSW contacted patient's wife Dylan [name] and made her aware of above. Dylan [name] is in agreement with Dylan [name]. Per [facility] has been expensive for patient in the past and they would like to use a wheel chair Dylan [name] for [name]. CSW  gave wife Dylan [name] contact information so she can make private pay arrestments. Per wife she has arranged for Dylan [name] and she has a wheel chair at their house that patient can use for [name]. Per Dylan [name] Dylan [name] liaison she will start Dylan [name] SNF authorization today. CSW will continue to follow and assist as needed.   Employment status:  Retired Nurse, adult PT Recommendations:  Dylan [name]:  Dylan [name]  Patient/Family's Response to care:  Patient and his wife chose Dylan [name].   Patient/Family's Understanding of and Emotional Response to Diagnosis, Current Treatment, and Prognosis:  Patient was very pleasant and thanked CSW for assistance.   Emotional Assessment Appearance:  Appears stated age Attitude/Demeanor/Rapport:    Affect (typically observed):  Accepting, Adaptable, Pleasant Orientation:  Oriented to Self, Oriented to Place, Oriented to  Time, Oriented to Situation Alcohol / Substance use:  Not Applicable Psych involvement (Current and /or in the community):  No (Comment)  Discharge Needs  Concerns to be addressed:  Discharge [name] Concerns Readmission within the last 30 days:  No Current discharge risk:  Dependent with Mobility Barriers to Discharge:  Continued [name] Work up   [employer], Dylan Beets, LCSW 06/02/2018, 2:44 PM

## 2018-06-02 NOTE — Discharge Summary (Addendum)
Physician Discharge Summary  Patient ID: Dylan [name] MRN: [id number] DOB/AGE: September 29, 1935 82 y.o.  Admit date: 06/01/2018 Discharge date: 06/03/2018  Admission Diagnoses:  PRIMARY OSTEOARTHRITIS OF LEFT KNEE   Discharge Diagnoses: Patient Active Problem List   Diagnosis Date Noted  . S/P total knee arthroplasty 06/01/2018  . Primary osteoarthritis of left knee 02/05/2018  . Sepsis (HCC) 01/10/2018  . Pressure injury of skin 12/02/2017  . Community acquired pneumonia 12/01/2017  . Pneumonia 12/01/2017  . Hypertension 09/23/2017  . Hyperlipidemia 09/23/2017  . Hematoma of leg, right, subsequent encounter 09/22/2017  . Blood blister 09/22/2017  . Weakness 08/31/2017  . Benign essential tremor 10/18/2016  . Class 3 severe obesity due to excess calories with serious comorbidity and body mass index (BMI) of 45.0 to 49.9 in adult (HCC) 10/18/2016  . Varicose veins of lower extremities with ulcer (HCC) 06/17/2016  . Chronic venous insufficiency 06/17/2016  . Lymphedema 06/17/2016  . Acute on chronic diastolic CHF (congestive heart failure) (HCC) 06/10/2016  . COPD, severe (HCC) 05/24/2016  . AV block, Mobitz 1 02/09/2016  . Coronary atherosclerosis of native coronary artery 02/09/2016  . ST elevation MI (STEMI) (HCC) 02/08/2016  . CHF (congestive heart failure) (HCC) 12/15/2015  . Right foot drop 04/07/2014  . Rotator cuff rupture, complete 04/22/2013  . Neck pain 04/14/2013    Past Medical History:  Diagnosis Date  . CAD (coronary artery disease)   . Cervicalgia   . CHF (congestive heart failure) (HCC)   . COPD (chronic obstructive pulmonary disease) (HCC)   . Diastolic heart failure (HCC)   . Foot drop, right   . History of kidney stones   . Hyperlipidemia    unspecified  . Hypertension   . Myocardial infarction (HCC)   . Osteoarthritis   . Shoulder pain, left   . Sleep apnea   . Tremor, essential      Transfusion: No transfusions during this admission    Consultants (if any):   Discharged Condition: Improved  Hospital Course: Dylan [name] is an 82 y.o. male who was admitted 06/01/2018 with a diagnosis of degenerative arthrosis left knee and went to the operating room on 06/01/2018 and underwent the above named procedures.    Surgeries:Procedure(s): COMPUTER ASSISTED TOTAL KNEE ARTHROPLASTY on 06/01/2018  PRE-OPERATIVE DIAGNOSIS: Degenerative arthrosis of the left knee, primary  POST-OPERATIVE DIAGNOSIS:  Same  PROCEDURE:  Left total knee arthroplasty using computer-assisted navigation  SURGEON:  Dylan Gauss. M.D.  ASSISTANT:  Van Clines, PA (present and scrubbed throughout the case, critical for assistance with exposure, retraction, instrumentation, and closure)  ANESTHESIA: spinal  ESTIMATED BLOOD LOSS: 50 mL  FLUIDS REPLACED: 600 mL of crystalloid  TOURNIQUET TIME: 122 minutes  DRAINS: 2 medium Hemovac drains  SOFT TISSUE RELEASES: Anterior cruciate ligament, posterior cruciate ligament, deep medial collateral ligament, patellofemoral ligament, posterolateral corner, and pie crusting of the IT band.  IMPLANTS UTILIZED: DePuy Attune size 8 posterior stabilized femoral component (cemented), size 9 rotating platform tibial component (cemented), 41 mm medialized dome patella (cemented), and a 5 mm stabilized rotating platform polyethylene insert.  INDICATIONS FOR SURGERY: Dylan [name] is a 82 y.o. year old male with a long history of progressive knee pain. X-rays demonstrated severe degenerative changes in tricompartmental fashion. The patient had not seen any significant improvement despite conservative nonsurgical intervention. After discussion of the risks and benefits of surgical intervention, the patient expressed understanding of the risks benefits and agree with plans for total knee arthroplasty.  The risks, benefits, and alternatives were discussed at length including but not limited to the risks of  infection, bleeding, nerve injury, stiffness, blood clots, the need for revision surgery, cardiopulmonary complications, among others, and they were willing to proceed. Patient tolerated the surgery well. No complications .Patient was taken to PACU where she was stabilized and then transferred to the orthopedic floor.  Patient started on Lovenox 30 mg q 12 hrs. Foot pumps applied bilaterally at 80 mm hgb. Heels elevated off bed with rolled towels. No evidence of DVT. Calves non tender. Negative Homan. Physical therapy started on day #1 for gait training and transfer with OT starting on  day #1 for ADL and assisted devices. Patient has done well with therapy.  Able to ambulate.  Took to assist with getting patient from bed to standing with knee being blocked.    Patient's IV And Foley were discontinued on day #1 with Hemovac being discontinued on day #2. Dressing was changed on day 2 prior to patient being discharged   He was given perioperative antibiotics:  Anti-infectives (From admission, onward)   Start     Dose/Rate Route Frequency Ordered Stop   06/01/18 2100  ceFAZolin (ANCEF) IVPB 2g/100 mL premix     2 g 200 mL/hr over 30 Minutes Intravenous Every 6 hours 06/01/18 2009 06/02/18 2059   06/01/18 0600  ceFAZolin (ANCEF) 3 g in dextrose 5 % 50 mL IVPB  Status:  Discontinued     3 g 100 mL/hr over 30 Minutes Intravenous On call to O.R. 05/31/18 2157 06/01/18 1300    .  He was fitted with AV 1 compression foot pump devices, instructed on heel pumps, early ambulation, and fitted with TED stockings bilaterally for DVT prophylaxis.  He benefited maximally from the hospital stay and there were no complications.    Recent vital signs:  Vitals:   06/02/18 0426 06/02/18 0737  BP: (!) 144/72 (!) 146/93  Pulse: 60 60  Resp: 19 18  Temp: (!) 97.5 F (36.4 C) (!) 97.5 F (36.4 C)  SpO2: 100% 97%    Recent laboratory studies:  Lab Results  Component Value Date   HGB 13.4 05/27/2018    HGB 13.2 05/20/2018   HGB 13.2 04/23/2018   Lab Results  Component Value Date   WBC 9.0 05/27/2018   PLT 143 (L) 05/27/2018   Lab Results  Component Value Date   INR 1.18 05/20/2018   Lab Results  Component Value Date   NA 142 05/20/2018   K 3.6 05/20/2018   CL 104 05/20/2018   CO2 31 05/20/2018   BUN 36 (H) 05/20/2018   CREATININE 1.25 (H) 05/20/2018   GLUCOSE 111 (H) 05/20/2018    Discharge Medications:   Allergies as of 06/02/2018   No Known Allergies     Medication List    STOP taking these medications   aspirin EC 81 MG tablet     TAKE these medications   acetaminophen 500 MG tablet Commonly known as:  TYLENOL Take 1,000 mg by mouth 2 (two) times daily as needed for moderate pain.   atorvastatin 40 MG tablet Commonly known as:  LIPITOR Take 40 mg by mouth every evening.   carvedilol 12.5 MG tablet Commonly known as:  COREG Take 12.5 mg by mouth 2 (two) times daily.   CENTROVITE Tabs Take 1 tablet by mouth daily.   docusate sodium 100 MG capsule Commonly known as:  COLACE Take 300 mg by mouth at bedtime.  enoxaparin 30 MG/0.3ML injection Commonly known as:  LOVENOX Inject 0.3 mLs (30 mg total) into the skin every 12 (twelve) hours for 14 days. Start taking on:  06/04/2018   gabapentin 400 MG capsule Commonly known as:  NEURONTIN Take 400 mg by mouth at bedtime.   hydrochlorothiazide 25 MG tablet Commonly known as:  HYDRODIURIL Take 25 mg by mouth daily.   ipratropium-albuterol 0.5-2.5 (3) MG/3ML Soln Commonly known as:  DUONEB Take 3 mLs by nebulization every 6 (six) hours as needed (shortness of breath).   isosorbide mononitrate 30 MG 24 hr tablet Commonly known as:  IMDUR Take 30 mg by mouth daily.   ketotifen 0.025 % ophthalmic solution Commonly known as:  ZADITOR Place 1 drop into both eyes 2 (two) times daily.   lactose free nutrition Liqd Take 237 mLs by mouth 3 (three) times a week.   lisinopril 5 MG tablet Commonly known  as:  PRINIVIL,ZESTRIL Take 5 mg by mouth daily.   loratadine 10 MG tablet Commonly known as:  CLARITIN Take 10 mg by mouth daily as needed for allergies.   MUCUS-ER PO Take 1 tablet by mouth every 12 (twelve) hours as needed.   nitroGLYCERIN 0.3 MG SL tablet Commonly known as:  NITROSTAT Place 0.3 mg under the tongue every 5 (five) minutes as needed for chest pain.   oxyCODONE 5 MG immediate release tablet Commonly known as:  Oxy IR/ROXICODONE Take 1 tablet (5 mg total) by mouth every 4 (four) hours as needed for moderate pain (pain score 4-6).   potassium chloride 10 MEQ tablet Commonly known as:  K-DUR,KLOR-CON Take 10 mEq by mouth daily. May take an additional 10 meq dose as needed for swelling   POTASSIUM GLUCONATE PO Take 1 capsule by mouth daily.   primidone 50 MG tablet Commonly known as:  MYSOLINE Take 100 mg by mouth 2 (two) times daily.   torsemide 20 MG tablet Commonly known as:  DEMADEX Take 20 mg by mouth daily.   traMADol 50 MG tablet Commonly known as:  ULTRAM Take 1-2 tablets (50-100 mg total) by mouth every 4 (four) hours as needed for moderate pain.   umeclidinium-vilanterol 62.5-25 MCG/INH Aepb Commonly known as:  ANORO ELLIPTA Inhale 1 puff into the lungs daily. What changed:    when to take this  reasons to take this   VITAMIN C PO Take 1 tablet by mouth daily.            Durable Medical Equipment  (From admission, onward)         Start     Ordered   06/01/18 2010  DME Walker rolling  Once    Question:  Patient needs a walker to treat with the following condition  Answer:  Total knee replacement status   06/01/18 2009   06/01/18 2010  DME Bedside commode  Once    Question:  Patient needs a bedside commode to treat with the following condition  Answer:  Total knee replacement status   06/01/18 2009          Diagnostic Studies: Dg Knee Left Port  Result Date: 06/01/2018 CLINICAL DATA:  Post LT knee replacement. Best possible  images due to pt body habitus, per Ortho DR images fine for what he needed. EXAM: PORTABLE LEFT KNEE - 1-2 VIEW COMPARISON:  09/01/2017 FINDINGS: Patient has undergone LEFT total knee arthroplasty. Alignment is normal. Postoperative gas and fluid identified within the joint space. There is atherosclerotic calcification of the popliteal artery.  IMPRESSION: Status post total knee arthroplasty. Expected postoperative changes. Electronically Signed   By: Norva Pavlov M.D.   On: 06/01/2018 19:27    Disposition:   Discharge Instructions    Diet - low sodium heart healthy   Complete by:  As directed    Increase activity slowly   Complete by:  As directed       Follow-up Information    Tera Partridge, PA On 06/16/2018.   Specialty:  Physician Assistant Why:  at 10:15am Contact information: [address] [facility] [place] [address] [phone]        Donato Heinz, MD On 07/16/2018.   Specialty:  Orthopedic Surgery Why:  at 10:45am Contact information: [address] [facility] [place] [address] [phone]            Signed: Tera Partridge 06/02/2018, 7:47 AM

## 2018-06-02 NOTE — Progress Notes (Signed)
Pt remains alert and oriented. Tolerated sitting on side of bed with no problem. Incentive spirometer teaching done with teach back. Pain managed. Pt tolerated bone foam in short increments. Foley removed this am. Dsg to left knee dry and intact with hemovac and polar care in place

## 2018-06-02 NOTE — Progress Notes (Signed)
Per conversation with Hamilton Capri, 250cc bolus x 1, bladder scan and may In and out cath for for 300 or greater. New order for tamsulosin given per order.

## 2018-06-02 NOTE — Progress Notes (Signed)
Spoke with pt about home Cpap. Pt stated he was nauseated at this time. RN aware and will call when pt needs assistance.

## 2018-06-02 NOTE — Evaluation (Signed)
Physical Therapy Evaluation Patient Details Name: Dylan [name] MRN: [id number] DOB: Nov 09, 1935 Today's Date: 06/02/2018   History of Present Illness  Pt is an 82 y.o. male s/p L TKA 06/01/18.  Pitting edema noted L foot and R LE.  PMH includes CAD, cervicalgia, CHF, COPD, diastolic heart failure, R foot drop, htn, MI, L shoulder pain, L rotator cuff injury, back surgery, tremor, h/o wound vac R LE.  Clinical Impression  Prior to hospital admission, pt was able to transfer from lift recliner to/from elevated BSC with RW (has not been able to take more than a couple of steps for a while).  Pt lives with his wife in 1 level home with ramp to enter.  Currently pt is 2 assist with bed mobility and unable to stand fully upright with max assist of 2 using RW and bed height significantly elevated.  Able to perform L LE SLR without physical assist x10 reps with vc's for positioning and technique.  Pain L knee 0/10 at rest beginning/end of session and 8-9/10 with activity.  H/o L shoulder issues.  Pt would benefit from skilled PT to address noted impairments and functional limitations (see below for any additional details).  Upon hospital discharge, recommend pt discharge to STR.    Follow Up Recommendations SNF    Equipment Recommendations  Rolling walker with 5" wheels;Wheelchair cushion (measurements PT);Wheelchair (measurements PT)    Recommendations for Other Services OT consult     Precautions / Restrictions Precautions Precautions: Fall Precaution Comments: R AFO Required Braces or Orthoses: Knee Immobilizer - Left Knee Immobilizer - Left: Discontinue once straight leg raise with < 10 degree lag Restrictions Weight Bearing Restrictions: Yes LLE Weight Bearing: Weight bearing as tolerated      Mobility  Bed Mobility Overal bed mobility: Needs Assistance Bed Mobility: Supine to Sit;Sit to Supine     Supine to sit: Min assist;Mod assist;+2 for physical assistance;HOB elevated Sit to  supine: Mod assist;+2 for physical assistance;HOB elevated   General bed mobility comments: assist for trunk and B LE's; vc's for technique; limited d/t L knee pain and B shoulder issues  Transfers Overall transfer level: Needs assistance Equipment used: Rolling walker (2 wheeled) Transfers: Sit to/from Stand Sit to Stand: Max assist;+2 physical assistance         General transfer comment: x5 trials; 1st 2 attempts unable to get pt's bottom off of bed; 3rd and 4th attempt pt able to get bottom just off of bed; and 5th attempt able to come to 3/4th stand; bed height significantly elevated for all transfers; vc's for UE and LE placement required  Ambulation/Gait             General Gait Details: not appropriate at this time  Stairs            Wheelchair Mobility    Modified Rankin (Stroke Patients Only)       Balance Overall balance assessment: Needs assistance Sitting-balance support: Single extremity supported;Feet supported Sitting balance-Leahy Scale: Poor Sitting balance - Comments: requires at least single UE support for static sitting balance       Standing balance comment: unable to obtain standing to assess                             Pertinent Vitals/Pain Pain Assessment: No/denies pain(0/10 at rest beginning/end of PT session; 8-9/10 with activity).      Home Living Family/patient expects to be discharged to::  Skilled nursing facility Living Arrangements: Spouse/significant other Available Help at Discharge: Family;Personal care attendant Type of Home: House Home Access: Ramped entrance(ramp in front; 4 steps with L railing from back (hasn't used since January))     Home Layout: One level Home Equipment: Walker - 2 wheels;Cane - single point;Bedside commode;Shower seat;Grab bars - tub/shower;Wheelchair - manual;Other (comment)(Lift chair (recliner); adjustable bed with bed rail)      Prior Function Level of Independence: Needs  assistance   Gait / Transfers Assistance Needed: Pt uses RW to perform stand pivot transfers from recliner lift chair to elevated BSC.  Needs assist to stand from manual w/c with elevated cushion.  Has not ambulated since earlier this year.  Has h/o falls in past 6 months d/t L knee buckling.  Sleeps in lift chair.  ADL's / Homemaking Assistance Needed: Has assist from wife and PCA that comes 5 days a week (check in on pt multiple times a day); assist with sponge bathing and dressing  Comments: Uses R AFO for mobility.  Wife works.     Hand Dominance        Extremity/Trunk Assessment   Upper Extremity Assessment Upper Extremity Assessment: Defer to OT evaluation(H/o B shoulder issues with limited B UE AROM)    Lower Extremity Assessment Lower Extremity Assessment: LLE deficits/detail;RLE deficits/detail RLE Deficits / Details: 0/5 DF (h/o drop foot); at least 3/5 AROM hip flexion and knee flexion/extension LLE Deficits / Details: able to perform L LE SLR with vc's for technique and positioning; at least 3/5 hip flexion and DF/PF LLE: Unable to fully assess due to pain    Cervical / Trunk Assessment Cervical / Trunk Assessment: Kyphotic  Communication   Communication: No difficulties  Cognition Arousal/Alertness: Awake/alert Behavior During Therapy: WFL for tasks assessed/performed Overall Cognitive Status: Within Functional Limits for tasks assessed                                        General Comments General comments (skin integrity, edema, etc.): Hemovac and polar care in place.  Pt agreeable to PT session.  Pt's son present during session.    Exercises Total Joint Exercises Ankle Circles/Pumps: AROM;Strengthening;Left;10 reps;Supine Quad Sets: AROM;Strengthening;Both;10 reps;Supine Short Arc Quad: AAROM;Strengthening;Left;10 reps;Supine Heel Slides: AAROM;Strengthening;Left;10 reps;Supine Hip ABduction/ADduction: AAROM;Strengthening;Left;10  reps;Supine Straight Leg Raises: AROM;Strengthening;Left;10 reps;Supine Goniometric ROM: L knee extension to neutral; plan to assess L knee flexion ROM this afternoon   Assessment/Plan    PT Assessment Patient needs continued PT services  PT Problem List Decreased strength;Decreased range of motion;Decreased activity tolerance;Decreased balance;Decreased mobility;Decreased knowledge of use of DME;Decreased knowledge of precautions;Pain;Decreased skin integrity       PT Treatment Interventions DME instruction;Gait training;Functional mobility training;Therapeutic activities;Therapeutic exercise;Balance training;Patient/family education    PT Goals (Current goals can be found in the Care Plan section)  Acute Rehab PT Goals Patient Stated Goal: to improve mobility and have less knee pain PT Goal Formulation: With patient Time For Goal Achievement: 06/16/18 Potential to Achieve Goals: Good    Frequency BID   Barriers to discharge Decreased caregiver support      Co-evaluation               AM-PAC PT "6 Clicks" Daily Activity  Outcome Measure Difficulty turning over in bed (including adjusting bedclothes, sheets and blankets)?: A Lot Difficulty moving from lying on back to sitting on the side of the bed? :  Unable Difficulty sitting down on and standing up from a chair with arms (e.g., wheelchair, bedside commode, etc,.)?: Unable Help needed moving to and from a bed to chair (including a wheelchair)?: Total Help needed walking in hospital room?: Total Help needed climbing 3-5 steps with a railing? : Total 6 Click Score: 7    End of Session Equipment Utilized During Treatment: Gait belt Activity Tolerance: Patient limited by pain Patient left: in bed;with call bell/phone within reach;with bed alarm set;with nursing/sitter in room;with family/visitor present;with SCD's reapplied;Other (comment)(L LE elevated via bone foam; R heel elevated via towel roll; polar care in place and  activated) Nurse Communication: Mobility status;Precautions;Weight bearing status;Other (comment)(Pt's pain status) PT Visit Diagnosis: Other abnormalities of gait and mobility (R26.89);Muscle weakness (generalized) (M62.81);History of falling (Z91.81);Difficulty in walking, not elsewhere classified (R26.2);Pain Pain - Right/Left: Left Pain - part of body: Knee    Time: 1040-1136 PT Time Calculation (min) (ACUTE ONLY): 56 min   Charges:   PT Evaluation $PT Eval Low Complexity: 1 Low PT Treatments $Therapeutic Exercise: 8-22 mins $Therapeutic Activity: 8-22 mins       Hendricks Limes, PT 06/02/18, 12:26 PM [phone]

## 2018-06-02 NOTE — Anesthesia Postprocedure Evaluation (Signed)
Anesthesia Post Note  Patient: Dylan [name]  Procedure(s) Performed: COMPUTER ASSISTED TOTAL KNEE ARTHROPLASTY (Left )  Patient location during evaluation: Nursing Unit Anesthesia Type: Spinal Level of consciousness: awake, awake and alert and oriented Pain management: pain level controlled Vital Signs Assessment: post-procedure vital signs reviewed and stable Respiratory status: spontaneous breathing, nonlabored ventilation and respiratory function stable Cardiovascular status: blood pressure returned to baseline and stable Postop Assessment: no headache and no backache Anesthetic complications: no     Last Vitals:  Vitals:   06/02/18 0426 06/02/18 0737  BP: (!) 144/72 (!) 146/93  Pulse: 60 60  Resp: 19 18  Temp: (!) 36.4 C (!) 36.4 C  SpO2: 100% 97%    Last Pain:  Vitals:   06/02/18 0908  TempSrc:   PainSc: 5                  [employer]

## 2018-06-02 NOTE — Clinical Social Work Placement (Signed)
   CLINICAL SOCIAL WORK PLACEMENT  NOTE  Date:  06/02/2018  Patient Details  Name: Dylan [name] MRN: [id number] Date of Birth: 1936/02/25  Clinical Social Work is seeking post-discharge placement for this patient at the Skilled  Nursing Facility level of care (*CSW will initial, date and re-position this form in  chart as items are completed):  Yes   Patient/family provided with [facility] Clinical Social Work Department's list of facilities offering this level of care within the geographic area requested by the patient (or if unable, by the patient's family).  Yes   Patient/family informed of their freedom to choose among providers that offer the needed level of care, that participate in Medicare, Medicaid or managed care program needed by the patient, have an available bed and are willing to accept the patient.  Yes   Patient/family informed of [facility]'s ownership interest in [facility] and [facility], as well as of the fact that they are under no obligation to receive care at these facilities.  PASRR submitted to EDS on       PASRR number received on       Existing PASRR number confirmed on 06/02/18     FL2 transmitted to all facilities in geographic area requested by pt/family on 06/02/18     FL2 transmitted to all facilities within larger geographic area on       Patient informed that his/her managed care company has contracts with or will negotiate with certain facilities, including the following:        Yes   Patient/family informed of bed offers received.  Patient chooses bed at ([facility] )     Physician recommends and patient chooses bed at      Patient to be transferred to   on  .  Patient to be transferred to facility by       Patient family notified on   of transfer.  Name of family member notified:        PHYSICIAN       Additional Comment:    _______________________________________________ Betty Daidone, Darleen Crocker, LCSW 06/02/2018, 2:44 PM

## 2018-06-02 NOTE — Progress Notes (Signed)
ORTHOPAEDICS PROGRESS NOTE  PATIENT NAME: Dylan [name] DOB: 1936-03-08  MRN: [id number]  POD # 1: Left total knee arthroplasty  Subjective: The patient rested well last night.  Pain has been under good control. He denies any nausea or vomiting.  He denies any shortness of breath.  Objective: Vital signs in last 24 hours: Temp:  [97 F (36.1 C)-98.7 F (37.1 C)] 97.5 F (36.4 C) (10/01 0426) Pulse Rate:  [58-67] 60 (10/01 0426) Resp:  [10-19] 19 (10/01 0426) BP: (127-177)/(72-97) 144/72 (10/01 0426) SpO2:  [95 %-100 %] 100 % (10/01 0426) Weight:  [136.2 kg-137.2 kg] 137.2 kg (09/30 2131)  Intake/Output from previous day: 09/30 0701 - 10/01 0700 In: 1000 [I.V.:1000] Out: 1310 [Urine:1000; Drains:260; Blood:50]  No results for input(s): WBC, HGB, HCT, PLT, K, CL, CO2, BUN, CREATININE, GLUCOSE, CALCIUM, LABPT, INR in the last 72 hours.  EXAM General: Well-developed well-nourished obese male seen in no apparent discomfort. Lungs: clear to auscultation Cardiac: normal rate and regular rhythm Left lower extremity: Dressing is dry and intact.  Polar Care and Hemovac drain are in place and functioning.  The bone foam is in place and the knee is fully extended.  The patient can perform a straight leg raise with assist.  Some pitting edema is noted to the left foot (and mild pitting edema to the right lower extremity). Neurologic: Awake, alert, and oriented.  Sensory and motor function are intact with the exception of a right foot drop.  Assessment: Left total knee arthroplasty  Secondary diagnoses: Sleep apnea History of myocardial infarction Hypertension Hyperlipidemia Nephrolithiasis Diastolic heart failure COPD Congestive heart failure Coronary artery disease Morbid obesity  Plan: Today's goals were reviewed with the patient.  Begin physical therapy and Occupational Therapy as per total knee arthroplasty rehab protocol. Plan is to go Skilled nursing facility after  hospital stay. DVT Prophylaxis - Lovenox, Foot Pumps and TED hose  James P. Angie Fava M.D.

## 2018-06-02 NOTE — Progress Notes (Signed)
Physical Therapy Treatment Patient Details Name: Dylan [name] MRN: [id number] DOB: 1936/06/27 Today's Date: 06/02/2018    History of Present Illness Pt is an 82 y.o. male s/p L TKA 06/01/18.  Pitting edema noted L foot and R LE.  PMH includes CAD, cervicalgia, CHF, COPD, diastolic heart failure, R foot drop, htn, MI, L shoulder pain, L rotator cuff injury, back surgery, tremor, h/o wound vac R LE.    PT Comments    Pt able to stand with 2 assist, RW, and bed height significantly elevated.  Pt able to march in place with L LE x5 trials (B UE support on RW) but pt unable take any steps with R LE (d/t c/o pain in L knee with increased weight bearing).  L knee flexion AAROM to 80 degrees sitting edge of bed.  Will continue to progress pt with strengthening, ROM, and progressive functional mobility per pt tolerance.   Follow Up Recommendations  SNF     Equipment Recommendations  Rolling walker with 5" wheels;Wheelchair cushion (measurements PT);Wheelchair (measurements PT)    Recommendations for Other Services OT consult     Precautions / Restrictions Precautions Precautions: Fall Precaution Comments: R AFO Required Braces or Orthoses: Knee Immobilizer - Left Knee Immobilizer - Left: Discontinue once straight leg raise with < 10 degree lag Restrictions Weight Bearing Restrictions: Yes LLE Weight Bearing: Weight bearing as tolerated    Mobility  Bed Mobility Overal bed mobility: Needs Assistance Bed Mobility: Sit to Supine     Sit to supine: Mod assist;+2 for physical assistance;HOB elevated   General bed mobility comments: assist for trunk and B LE's sit to supine; vc's for technique; 2 assist to boost pt up in bed  Transfers Overall transfer level: Needs assistance Equipment used: Rolling walker (2 wheeled) Transfers: Sit to/from Stand Sit to Stand: Max assist;+2 physical assistance         General transfer comment: bed height significantly elevated;  1st trial unable  to stand with 2 assist; 2nd-4th trial pt able to stand with R knee blocked  Ambulation/Gait Ambulation/Gait assistance: Min assist;Mod assist;+2 physical assistance   Assistive device: Rolling walker (2 wheeled)   Gait velocity: decreased   General Gait Details: weight shifting R/L x5 trials with RW; pt able to march in place with L LE x5 trials but unable put weight through L LE to take any steps with R LE; vc's for technique   Stairs             Wheelchair Mobility    Modified Rankin (Stroke Patients Only)       Balance Overall balance assessment: Needs assistance Sitting-balance support: Feet supported;No upper extremity supported Sitting balance-Leahy Scale: Fair Sitting balance - Comments: steady sitting static   Standing balance support: Bilateral upper extremity supported Standing balance-Leahy Scale: Poor Standing balance comment: requires B UE support for static standing balance                            Cognition Arousal/Alertness: Awake/alert Behavior During Therapy: WFL for tasks assessed/performed Overall Cognitive Status: Within Functional Limits for tasks assessed                                        Exercises Total Joint Exercises Goniometric ROM: L knee flexion AAROM 80 degrees sitting edge of bed    General Comments  General comments (skin integrity, edema, etc.): hemovac and polar care in place.  Pt agreeable to PT session.      Pertinent Vitals/Pain Pain Assessment: No/denies pain(0/10 at rest beginning/end of PT session)    Home Living Family/patient expects to be discharged to:: Skilled nursing facility Living Arrangements: Spouse/significant other Available Help at Discharge: Family;Neighbor Type of Home: House Home Access: Ramped entrance(ramp in front; 4 steps with L railing from back (hasn't used since January))   Home Layout: One level Home Equipment: Walker - 2 wheels;Cane - single point;Bedside  commode;Shower seat;Grab bars - tub/shower;Wheelchair - manual;Other (comment)(Lift chair (recliner); adjustable bed with bed rail)      Prior Function Level of Independence: Needs assistance  Gait / Transfers Assistance Needed: Pt uses RW to perform stand pivot transfers from recliner lift chair to elevated BSC.  Needs assist to stand from manual w/c with elevated cushion.  Has not ambulated since earlier this year.  Has h/o falls in past 6 months d/t L knee buckling.  Sleeps in lift chair. ADL's / Homemaking Assistance Needed: Has assist from wife and neighbor that comes 5 days a week (check in on pt multiple times a day); assist with sponge bathing and LB dressing, has been able to perform SPTs to [facility] during the day. Neighbor will assist with lunchtime meal prep as needed. Wife sets up medications.  Comments: Uses R AFO for mobility.  Wife works.   PT Goals (current goals can now be found in the care plan section) Acute Rehab PT Goals Patient Stated Goal: improve with mobility and have less pain PT Goal Formulation: With patient Time For Goal Achievement: 06/16/18 Potential to Achieve Goals: Good Progress towards PT goals: Progressing toward goals    Frequency    BID      PT Plan Current plan remains appropriate    Co-evaluation              AM-PAC PT "6 Clicks" Daily Activity  Outcome Measure  Difficulty turning over in bed (including adjusting bedclothes, sheets and blankets)?: A Lot Difficulty moving from lying on back to sitting on the side of the bed? : Unable Difficulty sitting down on and standing up from a chair with arms (e.g., wheelchair, bedside commode, etc,.)?: Unable Help needed moving to and from a bed to chair (including a wheelchair)?: Total Help needed walking in hospital room?: Total Help needed climbing 3-5 steps with a railing? : Total 6 Click Score: 7    End of Session Equipment Utilized During Treatment: Gait belt Activity Tolerance: Patient  limited by pain Patient left: in bed;with call bell/phone within reach;with bed alarm set;with nursing/sitter in room;with family/visitor present;with SCD's reapplied;Other (comment)(L LE elevated via bone foam; R heel elevated via towel roll; polar care in place and activated) Nurse Communication: Mobility status;Precautions;Weight bearing status;Other (comment)(Pt's pain status) PT Visit Diagnosis: Other abnormalities of gait and mobility (R26.89);Muscle weakness (generalized) (M62.81);History of falling (Z91.81);Difficulty in walking, not elsewhere classified (R26.2);Pain Pain - Right/Left: Left Pain - part of body: Knee     Time: 1355-1433 PT Time Calculation (min) (ACUTE ONLY): 38 min  Charges:  $Gait Training: 8-22 mins $Therapeutic Exercise: 8-22 mins $Therapeutic Activity: 8-22 mins                    Hendricks Limes, PT 06/02/18, 4:33 PM [phone]

## 2018-06-03 LAB — SURGICAL PATHOLOGY

## 2018-06-03 NOTE — Progress Notes (Signed)
Per Inetta Fermo [facility] liaison [facility] SNF authorization is still pending. Clinical Child psychotherapist (CSW) sent D/C orders to [facility] via [facility] and completed D/C packet in case [facility] gets [facility] authorization today.   [employer], LCSW [phone]

## 2018-06-03 NOTE — Progress Notes (Signed)
Physical Therapy Treatment Patient Details Name: Dylan [name] MRN: [id number] DOB: 02/13/1936 Today's Date: 06/03/2018    History of Present Illness Pt is an 82 y.o. male s/p L TKA 06/01/18.  Pitting edema noted L foot and R LE.  PMH includes CAD, cervicalgia, CHF, COPD, diastolic heart failure, R foot drop, htn, MI, L shoulder pain, L rotator cuff injury, back surgery, tremor, h/o wound vac R LE.    PT Comments    Pt improving with ability to stand with 2 assist from elevated bed surface using RW.  Pt able to march with L LE but L knee buckling with attempt to march with R LE (d/t L LE weakness and pain).  Pain 5/10 L knee during session but 0/10 end of session resting in bed.  Will continue to progress pt with strengthening, ROM, and progressive functional mobility per pt tolerance.    Follow Up Recommendations  SNF     Equipment Recommendations  Rolling walker with 5" wheels;Wheelchair cushion (measurements PT);Wheelchair (measurements PT)    Recommendations for Other Services OT consult     Precautions / Restrictions Precautions Precautions: Fall Precaution Comments: R AFO Required Braces or Orthoses: Knee Immobilizer - Left Knee Immobilizer - Left: Discontinue once straight leg raise with < 10 degree lag Restrictions Weight Bearing Restrictions: Yes LLE Weight Bearing: Weight bearing as tolerated    Mobility  Bed Mobility Overal bed mobility: Needs Assistance Bed Mobility: Supine to Sit;Sit to Supine     Supine to sit: Max assist;HOB elevated Sit to supine: Mod assist;+2 for physical assistance;HOB elevated   General bed mobility comments: assist for trunk and B LE's; vc's for technique; 2 assist to boost pt up in bed end of session  Transfers Overall transfer level: Needs assistance Equipment used: Rolling walker (2 wheeled) Transfers: Sit to/from Stand Sit to Stand: Min assist;Mod assist;+2 physical assistance         General transfer comment: bed height  significantly elevated;  x4 trials; B UE's pushing up off of walker to stand per therapist cueing (pt requesting to pull up on therapist instead though); vc's for upright posture and technique required  Ambulation/Gait Ambulation/Gait assistance: Min assist;Mod assist;+2 physical assistance   Assistive device: Rolling walker (2 wheeled)   Gait velocity: decreased   General Gait Details: 1st trial able to march in place x5 reps B LE's with RW use (L knee blocked with pillow between pt's knee and therapists knee so pt able to perform R LE marching); 2nd trial pt unable to march with R LE d/t L knee buckling (trialing without knee blocked) but able to march in place x10 reps with L LE; 3rd trial attempted marching with R LE with vc's and tactile cues for L quad set during L LE stance phase but pt unable to perform with 2 assist; 4th trial pt unable to march in place d/t fatigue; vc's for posture and technique required   Stairs             Wheelchair Mobility    Modified Rankin (Stroke Patients Only)       Balance Overall balance assessment: Needs assistance Sitting-balance support: Bilateral upper extremity supported;Feet supported Sitting balance-Leahy Scale: Fair Sitting balance - Comments: occasional posterior lean requiring assist to shift weight forward   Standing balance support: Bilateral upper extremity supported Standing balance-Leahy Scale: Poor Standing balance comment: requires B UE support for static standing balance  Cognition Arousal/Alertness: Awake/alert Behavior During Therapy: WFL for tasks assessed/performed Overall Cognitive Status: Within Functional Limits for tasks assessed                                        Exercises      General Comments General comments (skin integrity, edema, etc.): polar care in place; red drainage noted from prior hemovac site and lower part of honeycomb dressing (nurse  notified).  Pt agreeable to PT session.      Pertinent Vitals/Pain Pain Assessment: 0-10 Pain Score: 0-No pain Pain Location: L knee Pain Intervention(s): Limited activity within patient's tolerance;Monitored during session;Premedicated before session;Repositioned;Other (comment)(polar care applied and activated)    Home Living                      Prior Function            PT Goals (current goals can now be found in the care plan section) Acute Rehab PT Goals Patient Stated Goal: improve with mobility and have less pain PT Goal Formulation: With patient Time For Goal Achievement: 06/16/18 Potential to Achieve Goals: Good Progress towards PT goals: Progressing toward goals    Frequency    BID      PT Plan Current plan remains appropriate    Co-evaluation              AM-PAC PT "6 Clicks" Daily Activity  Outcome Measure  Difficulty turning over in bed (including adjusting bedclothes, sheets and blankets)?: A Lot Difficulty moving from lying on back to sitting on the side of the bed? : Unable Difficulty sitting down on and standing up from a chair with arms (e.g., wheelchair, bedside commode, etc,.)?: Unable Help needed moving to and from a bed to chair (including a wheelchair)?: Total Help needed walking in hospital room?: Total Help needed climbing 3-5 steps with a railing? : Total 6 Click Score: 7    End of Session Equipment Utilized During Treatment: Gait belt Activity Tolerance: Patient limited by pain;Patient limited by fatigue Patient left: in bed;with call bell/phone within reach;with bed alarm set;with SCD's reapplied;Other (comment)(L LE elevated via bone foam; R heel elevated via towel roll; polar care in place and activated) Nurse Communication: Mobility status;Precautions;Weight bearing status;Other (comment);Need for lift equipment(red drainage in dressings) PT Visit Diagnosis: Other abnormalities of gait and mobility (R26.89);Muscle  weakness (generalized) (M62.81);History of falling (Z91.81);Difficulty in walking, not elsewhere classified (R26.2);Pain Pain - Right/Left: Left Pain - part of body: Knee     Time: 1610-9604 PT Time Calculation (min) (ACUTE ONLY): 40 min  Charges:  $Gait Training: 8-22 mins $Therapeutic Activity: 23-37 mins                    Hendricks Limes, PT 06/03/18, 4:38 PM [phone]

## 2018-06-03 NOTE — Progress Notes (Signed)
Physical Therapy Treatment Patient Details Name: Dylan [name] MRN: [id number] DOB: October 12, 1935 Today's Date: 06/03/2018    History of Present Illness Pt is an 82 y.o. male s/p L TKA 06/01/18.  Pitting edema noted L foot and R LE.  PMH includes CAD, cervicalgia, CHF, COPD, diastolic heart failure, R foot drop, htn, MI, L shoulder pain, L rotator cuff injury, back surgery, tremor, h/o wound vac R LE.    PT Comments    Pt able to progress with standing marching in place x5 reps L LE, had sitting rest break, and then x5 reps marching with R LE with 2 assist (and walker use).  After another sitting rest break pt stood again but too fatigued to maintain upright standing with RW and unable to march in place again so pt assisted back to bed.  Still requiring 2 assist to stand with bed height significantly elevated.  Nurse notified of pt's functional status/assist levels and recommendation for hoyer lift for any transfer to chair or BSC (also written on pt's white board) d/t discussion of pt needing bowel movement today.  Follow Up Recommendations  SNF     Equipment Recommendations  Rolling walker with 5" wheels;Wheelchair cushion (measurements PT);Wheelchair (measurements PT)    Recommendations for Other Services OT consult     Precautions / Restrictions Precautions Precautions: Fall Precaution Comments: R AFO Required Braces or Orthoses: Knee Immobilizer - Left Knee Immobilizer - Left: Discontinue once straight leg raise with < 10 degree lag Restrictions Weight Bearing Restrictions: Yes LLE Weight Bearing: Weight bearing as tolerated    Mobility  Bed Mobility Overal bed mobility: Needs Assistance Bed Mobility: Supine to Sit;Sit to Supine     Supine to sit: Max assist;HOB elevated Sit to supine: Mod assist;+2 for physical assistance;HOB elevated   General bed mobility comments: assist for trunk and B LE's; vc's for technique; 2 assist to boost pt up in bed end of  session  Transfers Overall transfer level: Needs assistance Equipment used: Rolling walker (2 wheeled) Transfers: Sit to/from Stand Sit to Stand: Mod assist;+2 physical assistance         General transfer comment: bed height significantly elevated;  1st trial able to get to almost upright standing position with R knee blocked; 2nd-3rd trial pt able to stand with R knee blocked; able to stand 4th trial with no knee blocked; vc's for posture and technique required  Ambulation/Gait Ambulation/Gait assistance: Min assist;Mod assist;+2 physical assistance   Assistive device: Rolling walker (2 wheeled)   Gait velocity: decreased   General Gait Details: Pt able to stand marching in place with L LE x5 trials with R knee blocked; able to stand marching in place with R LE x5 trials with L knee blocked (pillow between pt's knee and therapists knee for comfort); pt sat between attempts; after another sitting rest break attempted to march again in standing but pt fatigued and unable to attempt marching in place again with 2 assist and walker use   Stairs             Wheelchair Mobility    Modified Rankin (Stroke Patients Only)       Balance Overall balance assessment: Needs assistance Sitting-balance support: Bilateral upper extremity supported;Feet supported   Sitting balance - Comments: intermittent posterior lean required assist to shift weight forward   Standing balance support: Bilateral upper extremity supported Standing balance-Leahy Scale: Poor Standing balance comment: requires B UE support for static standing balance  Cognition Arousal/Alertness: Awake/alert Behavior During Therapy: WFL for tasks assessed/performed Overall Cognitive Status: Within Functional Limits for tasks assessed                                        Exercises Total Joint Exercises Goniometric ROM: L knee extension to neutral; L knee  flexion AAROM 90 degrees sitting edge of bed    General Comments General comments (skin integrity, edema, etc.): polar care in place.  Nursing cleared pt for participation in physical therapy.  Pt agreeable to PT session.       Pertinent Vitals/Pain Pain Assessment: 0-10 Pain Score: 3 (8-9/10 with activity; 2-3/10 at rest end of session) Pain Location: L knee Pain Descriptors / Indicators: Sore Pain Intervention(s): Limited activity within patient's tolerance;Monitored during session;Premedicated before session;Repositioned;Other (comment)(polar care applied and activated)    Home Living                      Prior Function            PT Goals (current goals can now be found in the care plan section) Acute Rehab PT Goals Patient Stated Goal: improve with mobility and have less pain PT Goal Formulation: With patient Time For Goal Achievement: 06/16/18 Potential to Achieve Goals: Good Progress towards PT goals: Progressing toward goals    Frequency    BID      PT Plan Current plan remains appropriate    Co-evaluation              AM-PAC PT "6 Clicks" Daily Activity  Outcome Measure  Difficulty turning over in bed (including adjusting bedclothes, sheets and blankets)?: A Lot Difficulty moving from lying on back to sitting on the side of the bed? : Unable Difficulty sitting down on and standing up from a chair with arms (e.g., wheelchair, bedside commode, etc,.)?: Unable Help needed moving to and from a bed to chair (including a wheelchair)?: Total Help needed walking in hospital room?: Total Help needed climbing 3-5 steps with a railing? : Total 6 Click Score: 7    End of Session Equipment Utilized During Treatment: Gait belt Activity Tolerance: Patient limited by pain;Patient limited by fatigue Patient left: in bed;with call bell/phone within reach;with bed alarm set;with SCD's reapplied;Other (comment)(L LE elevated via bone foam; R heel elevated via  towel roll; polar care in place and activated) Nurse Communication: Mobility status;Precautions;Weight bearing status;Other (comment);Need for lift equipment PT Visit Diagnosis: Other abnormalities of gait and mobility (R26.89);Muscle weakness (generalized) (M62.81);History of falling (Z91.81);Difficulty in walking, not elsewhere classified (R26.2);Pain Pain - Right/Left: Left Pain - part of body: Knee     Time: 0981-1914 PT Time Calculation (min) (ACUTE ONLY): 38 min  Charges:  $Therapeutic Exercise: 8-22 mins $Therapeutic Activity: 23-37 mins                    Hendricks Limes, PT 06/03/18, 11:00 AM [phone]

## 2018-06-03 NOTE — Progress Notes (Addendum)
Per [facility] [place] liaison [facility] SNF authorization is still pending. Clinical Social Worker (CSW) contacted patient's wife [name] and made her aware of above. Per wife patient will need EMS for transport. Patient is aware of above.   [employer], LCSW [phone]

## 2018-06-03 NOTE — Progress Notes (Signed)
   Subjective: 2 Days Post-Op Procedure(s) (LRB): COMPUTER ASSISTED TOTAL KNEE ARTHROPLASTY (Left) Patient reports pain as mild.   Patient is well, and has had no acute complaints or problems Patient was not able to ambulate yesterday.  Have difficulty time getting out of the bed and took to assistance.  Also he had to block the knee. Plan is to go Rehab after hospital stay. no nausea and vomiting Patient denies any chest pains or shortness of breath. Objective: Vital signs in last 24 hours: Temp:  [97.5 F (36.4 C)-98.6 F (37 C)] 98.6 F (37 C) (10/02 0101) Pulse Rate:  [60-84] 67 (10/02 0101) Resp:  [18-20] 18 (10/02 0101) BP: (140-160)/(73-93) 160/86 (10/02 0101) SpO2:  [94 %-99 %] 94 % (10/02 0101) well approximated incision Heels are non tender and elevated off the bed using rolled towels Intake/Output from previous day: 10/01 0701 - 10/02 0700 In: 240 [P.O.:240] Out: 935 [Urine:725; Drains:210] Intake/Output this shift: No intake/output data recorded.  No results for input(s): HGB in the last 72 hours. No results for input(s): WBC, RBC, HCT, PLT in the last 72 hours. No results for input(s): NA, K, CL, CO2, BUN, CREATININE, GLUCOSE, CALCIUM in the last 72 hours. No results for input(s): LABPT, INR in the last 72 hours.  EXAM General - Patient is Alert, Appropriate and Oriented Extremity - Neurologically intact Neurovascular intact Sensation intact distally Intact pulses distally Dorsiflexion/Plantar flexion intact No cellulitis present Compartment soft Dressing - scant drainage Motor Function - intact, moving foot and toes well on exam.  Able to do straight leg raise on his own  Past Medical History:  Diagnosis Date  . CAD (coronary artery disease)   . Cervicalgia   . CHF (congestive heart failure) ([place])   . COPD (chronic obstructive pulmonary disease) ([place])   . Diastolic heart failure ([place])   . Foot drop, right   . History of kidney stones   .  Hyperlipidemia    unspecified  . Hypertension   . Myocardial infarction ([place])   . Osteoarthritis   . Shoulder pain, left   . Sleep apnea   . Tremor, essential     Assessment/Plan: 2 Days Post-Op Procedure(s) (LRB): COMPUTER ASSISTED TOTAL KNEE ARTHROPLASTY (Left) Active Problems:   S/P total knee arthroplasty  Estimated body mass index is 43.4 kg/m as calculated from the following:   Height as of this encounter: '5\' 10"'$  (1.778 m).   Weight as of this encounter: 137.2 kg. Up with therapy Discharge to SNF  Labs: None DVT Prophylaxis - Lovenox, Foot Pumps and TED hose Weight-Bearing as tolerated to left leg Patient needs a bowel movement prior to being discharged Hemovac was discontinued today.  No resistance met.  Into the drain appeared to be intact. Please change dressing, wash operative leg and apply TED stockings to both legs prior to discharge Please be sure bone foam goes with patient  Jillyn Ledger. [place] Stronach 06/03/2018, 7:22 AM

## 2018-06-04 NOTE — Progress Notes (Signed)
Physical Therapy Treatment Patient Details Name: Dylan [name] MRN: [id number] DOB: September 11, 1935 Today's Date: 06/04/2018    History of Present Illness Pt is an 82 y.o. male s/p L TKA 06/01/18.  Pitting edema noted L foot and R LE.  PMH includes CAD, cervicalgia, CHF, COPD, diastolic heart failure, R foot drop, htn, MI, L shoulder pain, L rotator cuff injury, back surgery, tremor, h/o wound vac R LE.    PT Comments    Pt did well with LE ex's in bed.  L KI donned for OOB mobility activities.  Pt still required 2 assist to stand from elevated bed height (and RW) but able to march in place with RW.  Pt sat to rest and then able to side step to R a couple steps with RW before needing to sit and rest again.  Attempted sidestepping to L but pt too fatigued to take any step with 2 assist and RW so pt assisted back to bed.  4-5/10 L knee pain with activity but 0/10 at rest end of session.  Will continue to progress pt with strengthening, ROM, and progressive functional mobility per pt tolerance.    Follow Up Recommendations  SNF     Equipment Recommendations  Rolling walker with 5" wheels;Wheelchair cushion (measurements PT);Wheelchair (measurements PT)    Recommendations for Other Services OT consult     Precautions / Restrictions Precautions Precautions: Fall Precaution Comments: R AFO Required Braces or Orthoses: Knee Immobilizer - Left Knee Immobilizer - Left: On when out of bed or walking Restrictions Weight Bearing Restrictions: Yes LLE Weight Bearing: Weight bearing as tolerated    Mobility  Bed Mobility Overal bed mobility: Needs Assistance Bed Mobility: Supine to Sit;Sit to Supine     Supine to sit: Max assist;HOB elevated Sit to supine: Mod assist;+2 for physical assistance;HOB elevated   General bed mobility comments: assist for trunk and B LE's; vc's for technique; 2 assist to boost pt up in bed end of session  Transfers Overall transfer level: Needs  assistance Equipment used: Rolling walker (2 wheeled) Transfers: Sit to/from Stand Sit to Stand: Min assist;Mod assist;+2 physical assistance         General transfer comment: bed height significantly elevated;  x5 trials; B UE's pushing up off of walker to stand; vc's for upright posture and technique required  Ambulation/Gait Ambulation/Gait assistance: Min assist;Mod assist;+2 physical assistance   Assistive device: Rolling walker (2 wheeled)   Gait velocity: decreased   General Gait Details: 1st trial able to step in place x5 reps B LE's with RW use (L knee in KI for all mobility); 2nd trial pt able to side step with RW to R x2 steps each B LE's; 3rd trial pt unable to take any steps with max cueing and 2 assist (d/t fatigue); vc's for posture and technique required; vc's for L LE positioning/movement required with use of L KI)   Stairs             Wheelchair Mobility    Modified Rankin (Stroke Patients Only)       Balance Overall balance assessment: Needs assistance Sitting-balance support: Bilateral upper extremity supported;Feet supported Sitting balance-Leahy Scale: Fair Sitting balance - Comments: occasional posterior lean requiring assist to shift weight forward   Standing balance support: Bilateral upper extremity supported Standing balance-Leahy Scale: Poor Standing balance comment: requires B UE support for static standing balance  Cognition Arousal/Alertness: Awake/alert Behavior During Therapy: WFL for tasks assessed/performed Overall Cognitive Status: Within Functional Limits for tasks assessed                                        Exercises Total Joint Exercises Ankle Circles/Pumps: AROM;Strengthening;Left;10 reps;Supine(x10 R PF AROM) Quad Sets: AROM;Strengthening;Both;10 reps;Supine Short Arc Quad: AROM;Strengthening;Left;10 reps;Supine Heel Slides: AAROM;Strengthening;Left;10  reps;Supine Hip ABduction/ADduction: AAROM;Strengthening;Left;10 reps;Supine Straight Leg Raises: AAROM;Strengthening;Left;10 reps;Supine Goniometric ROM: L knee extension to neutral; L knee flexion AAROM to 90 degrees    General Comments General comments (skin integrity, edema, etc.): polar care in place; red drainage noted from prior hemovac site and honeycomb dressing (nurse notified).  Pt agreeable to PT session.      Pertinent Vitals/Pain Pain Assessment: 0-10(4-5/10 with activity; 0/10 end of session resting in bed) Pain Score: 0-No pain Pain Descriptors / Indicators: Sore Pain Intervention(s): Limited activity within patient's tolerance;Monitored during session;Premedicated before session;Repositioned;Other (comment)(polar care applied and activated)  Vitals (HR and O2 on room air) stable and WFL throughout treatment session.    Home Living                      Prior Function            PT Goals (current goals can now be found in the care plan section) Acute Rehab PT Goals Patient Stated Goal: improve with mobility and have less pain PT Goal Formulation: With patient Time For Goal Achievement: 06/16/18 Potential to Achieve Goals: Good Progress towards PT goals: Progressing toward goals    Frequency    BID      PT Plan Current plan remains appropriate    Co-evaluation              AM-PAC PT "6 Clicks" Daily Activity  Outcome Measure  Difficulty turning over in bed (including adjusting bedclothes, sheets and blankets)?: A Lot Difficulty moving from lying on back to sitting on the side of the bed? : Unable Difficulty sitting down on and standing up from a chair with arms (e.g., wheelchair, bedside commode, etc,.)?: Unable Help needed moving to and from a bed to chair (including a wheelchair)?: Total Help needed walking in hospital room?: Total Help needed climbing 3-5 steps with a railing? : Total 6 Click Score: 7    End of Session Equipment  Utilized During Treatment: Gait belt Activity Tolerance: Patient limited by pain;Patient limited by fatigue Patient left: in bed;with call bell/phone within reach;with bed alarm set;with SCD's reapplied;Other (comment)(L LE elevated via bone foam; R heel elevated via towel roll; polar care in place and activated) Nurse Communication: Mobility status;Precautions;Weight bearing status;Other (comment);Need for lift equipment(red drainage in dressings) PT Visit Diagnosis: Other abnormalities of gait and mobility (R26.89);Muscle weakness (generalized) (M62.81);History of falling (Z91.81);Difficulty in walking, not elsewhere classified (R26.2);Pain Pain - Right/Left: Left Pain - part of body: Knee     Time: 0857-1005 PT Time Calculation (min) (ACUTE ONLY): 68 min  Charges:  $Therapeutic Exercise: 23-37 mins $Therapeutic Activity: 38-52 mins                     Taysom Glymph, PT 06/04/18, 11:00 AM [phone]

## 2018-06-04 NOTE — Progress Notes (Signed)
   Subjective: 3 Days Post-Op Procedure(s) (LRB): COMPUTER ASSISTED TOTAL KNEE ARTHROPLASTY (Left) Patient reports pain as mild.   Patient is well, and has had no acute complaints or problems Patient still unable to ambulate secondary to knee buckling. Plan is to go Rehab after hospital stay. no nausea and no vomiting Patient denies any chest pains or shortness of breath. Objective: Vital signs in last 24 hours: Temp:  [97.5 F (36.4 C)-97.8 F (36.6 C)] 97.8 F (36.6 C) (10/03 0735) Pulse Rate:  [57-64] 57 (10/03 0735) Resp:  [14-18] 14 (10/02 2314) BP: (117-132)/(58-74) 132/72 (10/03 0735) SpO2:  [97 %-100 %] 97 % (10/03 0735) well approximated incision Heels are non tender and elevated off the bed using rolled towels Intake/Output from previous day: 10/02 0701 - 10/03 0700 In: 240 [P.O.:240] Out: 1000 [Urine:1000] Intake/Output this shift: No intake/output data recorded.  No results for input(s): HGB in the last 72 hours. No results for input(s): WBC, RBC, HCT, PLT in the last 72 hours. No results for input(s): NA, K, CL, CO2, BUN, CREATININE, GLUCOSE, CALCIUM in the last 72 hours. No results for input(s): LABPT, INR in the last 72 hours.  EXAM General - Patient is Alert, Appropriate and Oriented Extremity - Neurologically intact Neurovascular intact Sensation intact distally Intact pulses distally Dorsiflexion/Plantar flexion intact No cellulitis present Compartment soft Dressing - moderate drainage serious sanguinous Motor Function - intact, moving foot and toes well on exam.    Past Medical History:  Diagnosis Date  . CAD (coronary artery disease)   . Cervicalgia   . CHF (congestive heart failure) (HCC)   . COPD (chronic obstructive pulmonary disease) (HCC)   . Diastolic heart failure (HCC)   . Foot drop, right   . History of kidney stones   . Hyperlipidemia    unspecified  . Hypertension   . Myocardial infarction (HCC)   . Osteoarthritis   . Shoulder  pain, left   . Sleep apnea   . Tremor, essential     Assessment/Plan: 3 Days Post-Op Procedure(s) (LRB): COMPUTER ASSISTED TOTAL KNEE ARTHROPLASTY (Left) Active Problems:   S/P total knee arthroplasty  Estimated body mass index is 43.4 kg/m as calculated from the following:   Height as of this encounter: 5\' 10"  (1.778 m).   Weight as of this encounter: 137.2 kg. Up with therapy Discharge to SNF  Labs: None DVT Prophylaxis - Lovenox, Foot Pumps and TED hose Weight-Bearing as tolerated to left leg Will order knee immobilizer to be worn only when ambulating.  Is not to be worn in bed Please change dressing prior to being discharged. Please apply TED stockings to both legs prior to discharge  [facility] 06/04/2018, 8:03 AM

## 2018-06-04 NOTE — Progress Notes (Addendum)
Patient is medically stable for D/C to [facility] today. Per Inetta Fermo [facility] liaison [facility] authorization has been received and they have no private rooms available and patient will go to room 407-A today. Per Inetta Fermo she should have a private room today or tomorrow for patient. RN will call report and arrange EMS for transport. Clinical Child psychotherapist (CSW) sent D/C orders to [facility] via [facility]. Patient is aware of above. Patient's son Ladene Artist is at bedside and aware of above. CSW contacted patient's wife [name] and made her aware of above. Please reconsult if future social work needs arise. CSW signing off.   Per Inetta Fermo patient can come to a private room today room 502. Patient and his son are aware of above.   [employer], LCSW [phone]

## 2018-06-04 NOTE — Care Management Important Message (Signed)
Important Message  Patient Details  Name: Dylan [name] MRN: [id number] Date of Birth: [birth year]   Medicare Important Message Given:  Yes    Olegario Messier A Kamaile Zachow 06/04/2018, 10:40 AM

## 2018-06-04 NOTE — Clinical Social Work Placement (Signed)
   CLINICAL SOCIAL WORK PLACEMENT  NOTE  Date:  06/04/2018  Patient Details  Name: Dylan [name] MRN: [id number] Date of Birth: 05-26-1936  Clinical Social Work is seeking post-discharge placement for this patient at the Skilled  Nursing Facility level of care (*CSW will initial, date and re-position this form in  chart as items are completed):  Yes   Patient/family provided with [place] Clinical Social Work Department's list of facilities offering this level of care within the geographic area requested by the patient (or if unable, by the patient's family).  Yes   Patient/family informed of their freedom to choose among providers that offer the needed level of care, that participate in Medicare, Medicaid or managed care program needed by the patient, have an available bed and are willing to accept the patient.  Yes   Patient/family informed of [facility]'s ownership interest in [facility] and [facility], as well as of the fact that they are under no obligation to receive care at these facilities.  PASRR submitted to EDS on       PASRR number received on       Existing PASRR number confirmed on 06/02/18     FL2 transmitted to all facilities in geographic area requested by pt/family on 06/02/18     FL2 transmitted to all facilities within larger geographic area on       Patient informed that his/her managed care company has contracts with or will negotiate with certain facilities, including the following:        Yes   Patient/family informed of bed offers received.  Patient chooses bed at ([facility] )     Physician recommends and patient chooses bed at      Patient to be transferred to ([facility] ) on 06/04/18.  Patient to be transferred to facility by [facility] )     Patient family notified on 06/04/18 of transfer.  Name of family member notified:  (Patient's wife [name] is aware of D/C today. )     PHYSICIAN       Additional Comment:     _______________________________________________ Jaelin Fackler, Darleen Crocker, LCSW 06/04/2018, 9:50 AM

## 2018-06-21 ENCOUNTER — Emergency Department: Payer: Medicare Other

## 2018-06-21 ENCOUNTER — Other Ambulatory Visit: Payer: Self-pay

## 2018-06-21 ENCOUNTER — Encounter
Admission: EM | Disposition: A | Discharge status: Skilled Nursing Facility | Payer: Self-pay | Source: Home / Self Care | Attending: Internal Medicine

## 2018-06-21 ENCOUNTER — Inpatient Hospital Stay
Admission: EM | Admit: 2018-06-21 | Discharge: 2018-06-24 | DRG: 247 | Disposition: A | Discharge status: Skilled Nursing Facility | Payer: Medicare Other | Attending: Internal Medicine | Admitting: Internal Medicine

## 2018-06-21 ENCOUNTER — Inpatient Hospital Stay
Admit: 2018-06-21 | Discharge: 2018-06-21 | Disposition: A | Discharge status: Home / Self Care | Payer: Medicare Other | Attending: Cardiovascular Disease | Admitting: Cardiovascular Disease

## 2018-06-21 ENCOUNTER — Encounter: Payer: Self-pay | Admitting: Emergency Medicine

## 2018-06-21 DIAGNOSIS — I2111 ST elevation (STEMI) myocardial infarction involving right coronary artery: Principal | ICD-10-CM | POA: Diagnosis present

## 2018-06-21 DIAGNOSIS — E876 Hypokalemia: Secondary | ICD-10-CM | POA: Diagnosis present

## 2018-06-21 DIAGNOSIS — I5033 Acute on chronic diastolic (congestive) heart failure: Secondary | ICD-10-CM

## 2018-06-21 DIAGNOSIS — G250 Essential tremor: Secondary | ICD-10-CM | POA: Diagnosis present

## 2018-06-21 DIAGNOSIS — I509 Heart failure, unspecified: Secondary | ICD-10-CM

## 2018-06-21 DIAGNOSIS — R0789 Other chest pain: Secondary | ICD-10-CM | POA: Diagnosis present

## 2018-06-21 DIAGNOSIS — I2119 ST elevation (STEMI) myocardial infarction involving other coronary artery of inferior wall: Secondary | ICD-10-CM | POA: Diagnosis not present

## 2018-06-21 DIAGNOSIS — I110 Hypertensive heart disease with heart failure: Secondary | ICD-10-CM | POA: Diagnosis present

## 2018-06-21 DIAGNOSIS — J449 Chronic obstructive pulmonary disease, unspecified: Secondary | ICD-10-CM | POA: Diagnosis present

## 2018-06-21 DIAGNOSIS — I2510 Atherosclerotic heart disease of native coronary artery without angina pectoris: Secondary | ICD-10-CM | POA: Diagnosis present

## 2018-06-21 DIAGNOSIS — F17210 Nicotine dependence, cigarettes, uncomplicated: Secondary | ICD-10-CM | POA: Diagnosis present

## 2018-06-21 DIAGNOSIS — I252 Old myocardial infarction: Secondary | ICD-10-CM

## 2018-06-21 DIAGNOSIS — I5032 Chronic diastolic (congestive) heart failure: Secondary | ICD-10-CM | POA: Diagnosis present

## 2018-06-21 DIAGNOSIS — Z79899 Other long term (current) drug therapy: Secondary | ICD-10-CM

## 2018-06-21 DIAGNOSIS — Z96652 Presence of left artificial knee joint: Secondary | ICD-10-CM | POA: Diagnosis present

## 2018-06-21 DIAGNOSIS — Z7951 Long term (current) use of inhaled steroids: Secondary | ICD-10-CM | POA: Diagnosis not present

## 2018-06-21 DIAGNOSIS — G4730 Sleep apnea, unspecified: Secondary | ICD-10-CM | POA: Diagnosis present

## 2018-06-21 DIAGNOSIS — E785 Hyperlipidemia, unspecified: Secondary | ICD-10-CM | POA: Diagnosis present

## 2018-06-21 DIAGNOSIS — Z6841 Body Mass Index (BMI) 40.0 and over, adult: Secondary | ICD-10-CM

## 2018-06-21 DIAGNOSIS — Z8249 Family history of ischemic heart disease and other diseases of the circulatory system: Secondary | ICD-10-CM

## 2018-06-21 DIAGNOSIS — I213 ST elevation (STEMI) myocardial infarction of unspecified site: Secondary | ICD-10-CM

## 2018-06-21 HISTORY — PX: LEFT HEART CATH AND CORONARY ANGIOGRAPHY: CATH118249

## 2018-06-21 HISTORY — PX: CORONARY/GRAFT ACUTE MI REVASCULARIZATION: CATH118305

## 2018-06-21 LAB — CBC WITH DIFFERENTIAL/PLATELET
Abs Immature Granulocytes: 0.08 10*3/uL — ABNORMAL HIGH (ref 0.00–0.07)
BASOS PCT: 1 %
Basophils Absolute: 0.1 10*3/uL (ref 0.0–0.1)
EOS ABS: 0.3 10*3/uL (ref 0.0–0.5)
Eosinophils Relative: 4 %
HCT: 37.1 % — ABNORMAL LOW (ref 39.0–52.0)
Hemoglobin: 11.9 g/dL — ABNORMAL LOW (ref 13.0–17.0)
Immature Granulocytes: 1 %
Lymphocytes Relative: 22 %
Lymphs Abs: 1.9 10*3/uL (ref 0.7–4.0)
MCH: 32.1 pg (ref 26.0–34.0)
MCHC: 32.1 g/dL (ref 30.0–36.0)
MCV: 100 fL (ref 80.0–100.0)
MONOS PCT: 10 %
Monocytes Absolute: 0.8 10*3/uL (ref 0.1–1.0)
Neutro Abs: 5.6 10*3/uL (ref 1.7–7.7)
Neutrophils Relative %: 62 %
PLATELETS: 226 10*3/uL (ref 150–400)
RBC: 3.71 MIL/uL — ABNORMAL LOW (ref 4.22–5.81)
RDW: 17.2 % — ABNORMAL HIGH (ref 11.5–15.5)
WBC: 8.8 10*3/uL (ref 4.0–10.5)
nRBC: 0 % (ref 0.0–0.2)

## 2018-06-21 LAB — ECHOCARDIOGRAM COMPLETE
HEIGHTINCHES: 70 in
Weight: 4553.82 oz

## 2018-06-21 LAB — COMPREHENSIVE METABOLIC PANEL
ALBUMIN: 3.3 g/dL — AB (ref 3.5–5.0)
ALK PHOS: 97 U/L (ref 38–126)
ALT: 24 U/L (ref 0–44)
ANION GAP: 7 (ref 5–15)
AST: 31 U/L (ref 15–41)
BILIRUBIN TOTAL: 0.7 mg/dL (ref 0.3–1.2)
BUN: 39 mg/dL — AB (ref 8–23)
CO2: 31 mmol/L (ref 22–32)
CREATININE: 1.3 mg/dL — AB (ref 0.61–1.24)
Calcium: 8.9 mg/dL (ref 8.9–10.3)
Chloride: 105 mmol/L (ref 98–111)
GFR calc Af Amer: 58 mL/min — ABNORMAL LOW (ref >60)
GFR calc non Af Amer: 50 mL/min — ABNORMAL LOW (ref >60)
GLUCOSE: 164 mg/dL — AB (ref 70–99)
Potassium: 3.9 mmol/L (ref 3.5–5.1)
SODIUM: 143 mmol/L (ref 135–145)
TOTAL PROTEIN: 6.7 g/dL (ref 6.5–8.1)

## 2018-06-21 LAB — PLATELET COUNT: Platelets: 215 10*3/uL (ref 150–400)

## 2018-06-21 LAB — APTT: APTT: 39 s — AB (ref 24–36)

## 2018-06-21 LAB — GLUCOSE, CAPILLARY: Glucose-Capillary: 141 mg/dL — ABNORMAL HIGH (ref 70–99)

## 2018-06-21 LAB — POCT ACTIVATED CLOTTING TIME
Activated Clotting Time: 274 seconds
Activated Clotting Time: 296 seconds

## 2018-06-21 LAB — BRAIN NATRIURETIC PEPTIDE: B NATRIURETIC PEPTIDE 5: 684 pg/mL — AB (ref 0.0–100.0)

## 2018-06-21 LAB — MRSA PCR SCREENING: MRSA by PCR: POSITIVE — AB

## 2018-06-21 LAB — TROPONIN I: TROPONIN I: 0.11 ng/mL — AB (ref <0.03)

## 2018-06-21 SURGERY — CORONARY/GRAFT ACUTE MI REVASCULARIZATION
Anesthesia: Moderate Sedation

## 2018-06-21 MED ORDER — TIROFIBAN HCL IV 12.5 MG/250 ML
INTRAVENOUS | Status: AC
  Filled 2018-06-21: qty 250

## 2018-06-21 MED ORDER — CARVEDILOL 6.25 MG PO TABS
6.2500 mg | ORAL_TABLET | Freq: Two times a day (BID) | ORAL | Status: DC
  Administered 2018-06-21 – 2018-06-24 (×7): 6.25 mg via ORAL
  Filled 2018-06-21 (×7): qty 1

## 2018-06-21 MED ORDER — ACETAMINOPHEN 325 MG PO TABS: 650.0000 mg | ORAL_TABLET | ORAL | Status: DC | PRN

## 2018-06-21 MED ORDER — TIROFIBAN (AGGRASTAT) BOLUS VIA INFUSION
INTRAVENOUS | Status: DC | PRN
  Administered 2018-06-21: 3430 ug via INTRAVENOUS

## 2018-06-21 MED ORDER — SODIUM CHLORIDE 0.9 % IV SOLN: INTRAVENOUS | Status: AC

## 2018-06-21 MED ORDER — TIROFIBAN HCL IN NACL 5-0.9 MG/100ML-% IV SOLN
INTRAVENOUS | Status: DC | PRN
  Administered 2018-06-21: 0.15 ug/kg/min via INTRAVENOUS

## 2018-06-21 MED ORDER — IPRATROPIUM-ALBUTEROL 0.5-2.5 (3) MG/3ML IN SOLN: 3.0000 mL | Freq: Four times a day (QID) | RESPIRATORY_TRACT | Status: DC | PRN

## 2018-06-21 MED ORDER — LISINOPRIL 5 MG PO TABS
5.0000 mg | ORAL_TABLET | Freq: Every day | ORAL | Status: DC
  Administered 2018-06-21: 5 mg via ORAL
  Filled 2018-06-21: qty 1

## 2018-06-21 MED ORDER — SODIUM CHLORIDE 0.9% FLUSH: 3.0000 mL | INTRAVENOUS | Status: DC | PRN

## 2018-06-21 MED ORDER — ENOXAPARIN SODIUM 40 MG/0.4ML ~~LOC~~ SOLN
40.0000 mg | SUBCUTANEOUS | Status: DC
  Administered 2018-06-22: 40 mg via SUBCUTANEOUS
  Filled 2018-06-21: qty 0.4

## 2018-06-21 MED ORDER — ASPIRIN 81 MG PO CHEW
81.0000 mg | CHEWABLE_TABLET | Freq: Every day | ORAL | Status: DC
  Administered 2018-06-21 – 2018-06-24 (×4): 81 mg via ORAL
  Filled 2018-06-21 (×4): qty 1

## 2018-06-21 MED ORDER — SODIUM CHLORIDE 0.9% FLUSH
3.0000 mL | Freq: Two times a day (BID) | INTRAVENOUS | Status: DC
  Administered 2018-06-21 – 2018-06-24 (×6): 3 mL via INTRAVENOUS

## 2018-06-21 MED ORDER — TIROFIBAN HCL IN NACL 5-0.9 MG/100ML-% IV SOLN
INTRAVENOUS | Status: AC
  Filled 2018-06-21: qty 100

## 2018-06-21 MED ORDER — TRAMADOL HCL 50 MG PO TABS: 50.0000 mg | ORAL_TABLET | ORAL | Status: DC | PRN

## 2018-06-21 MED ORDER — HEPARIN SODIUM (PORCINE) 1000 UNIT/ML IJ SOLN
INTRAMUSCULAR | Status: AC
  Filled 2018-06-21: qty 1

## 2018-06-21 MED ORDER — SODIUM CHLORIDE 0.9 % IV BOLUS
1000.0000 mL | Freq: Once | INTRAVENOUS | Status: AC
  Administered 2018-06-21: 1000 mL via INTRAVENOUS

## 2018-06-21 MED ORDER — OXYCODONE HCL 5 MG PO TABS: 5.0000 mg | ORAL_TABLET | ORAL | Status: DC | PRN

## 2018-06-21 MED ORDER — HEPARIN SODIUM (PORCINE) 5000 UNIT/ML IJ SOLN
4000.0000 [IU] | Freq: Once | INTRAMUSCULAR | Status: AC
  Administered 2018-06-21: 4000 [IU] via INTRAVENOUS

## 2018-06-21 MED ORDER — HEPARIN SODIUM (PORCINE) 1000 UNIT/ML IJ SOLN
INTRAMUSCULAR | Status: DC | PRN
  Administered 2018-06-21: 8000 [IU] via INTRAVENOUS
  Administered 2018-06-21: 2000 [IU] via INTRAVENOUS

## 2018-06-21 MED ORDER — ISOSORBIDE MONONITRATE ER 30 MG PO TB24
30.0000 mg | ORAL_TABLET | Freq: Every day | ORAL | Status: DC
  Administered 2018-06-21 – 2018-06-24 (×4): 30 mg via ORAL
  Filled 2018-06-21 (×4): qty 1

## 2018-06-21 MED ORDER — HEPARIN (PORCINE) IN NACL 1000-0.9 UT/500ML-% IV SOLN
INTRAVENOUS | Status: DC | PRN
  Administered 2018-06-21: 500 mL

## 2018-06-21 MED ORDER — KETOTIFEN FUMARATE 0.025 % OP SOLN
1.0000 [drp] | Freq: Two times a day (BID) | OPHTHALMIC | Status: DC
  Administered 2018-06-21: 1 [drp] via OPHTHALMIC
  Filled 2018-06-21: qty 5

## 2018-06-21 MED ORDER — LORATADINE 10 MG PO TABS: 10.0000 mg | ORAL_TABLET | Freq: Every day | ORAL | Status: DC | PRN

## 2018-06-21 MED ORDER — NITROGLYCERIN 0.4 MG SL SUBL: 0.4000 mg | SUBLINGUAL_TABLET | SUBLINGUAL | Status: DC | PRN

## 2018-06-21 MED ORDER — TICAGRELOR 90 MG PO TABS
90.0000 mg | ORAL_TABLET | Freq: Two times a day (BID) | ORAL | Status: DC
  Administered 2018-06-21 – 2018-06-22 (×2): 90 mg via ORAL
  Filled 2018-06-21 (×5): qty 1

## 2018-06-21 MED ORDER — PRIMIDONE 50 MG PO TABS
100.0000 mg | ORAL_TABLET | Freq: Two times a day (BID) | ORAL | Status: DC
  Administered 2018-06-21 – 2018-06-24 (×7): 100 mg via ORAL
  Filled 2018-06-21 (×8): qty 2

## 2018-06-21 MED ORDER — DOCUSATE SODIUM 100 MG PO CAPS
300.0000 mg | ORAL_CAPSULE | Freq: Every day | ORAL | Status: DC
  Administered 2018-06-21 – 2018-06-23 (×3): 300 mg via ORAL
  Filled 2018-06-21 (×3): qty 3

## 2018-06-21 MED ORDER — SODIUM CHLORIDE 0.9 % IV SOLN: 250.0000 mL | INTRAVENOUS | Status: DC | PRN

## 2018-06-21 MED ORDER — POTASSIUM CHLORIDE CRYS ER 10 MEQ PO TBCR
10.0000 meq | EXTENDED_RELEASE_TABLET | Freq: Every day | ORAL | Status: DC
  Administered 2018-06-21 – 2018-06-24 (×4): 10 meq via ORAL
  Filled 2018-06-21 (×4): qty 1

## 2018-06-21 MED ORDER — TIROFIBAN HCL IV 12.5 MG/250 ML
INTRAVENOUS | Status: DC | PRN
  Administered 2018-06-21: 0.15 ug/kg/min via INTRAVENOUS

## 2018-06-21 MED ORDER — FENTANYL CITRATE (PF) 100 MCG/2ML IJ SOLN
INTRAMUSCULAR | Status: AC
  Filled 2018-06-21: qty 2

## 2018-06-21 MED ORDER — IOPAMIDOL (ISOVUE-300) INJECTION 61%
INTRAVENOUS | Status: DC | PRN
  Administered 2018-06-21: 210 mL via INTRA_ARTERIAL

## 2018-06-21 MED ORDER — ATORVASTATIN CALCIUM 20 MG PO TABS
40.0000 mg | ORAL_TABLET | Freq: Every evening | ORAL | Status: DC
  Administered 2018-06-21 – 2018-06-23 (×3): 40 mg via ORAL
  Filled 2018-06-21 (×3): qty 2

## 2018-06-21 MED ORDER — VERAPAMIL HCL 2.5 MG/ML IV SOLN
INTRAVENOUS | Status: DC | PRN
  Administered 2018-06-21: 2.5 mg via INTRA_ARTERIAL

## 2018-06-21 MED ORDER — PERFLUTREN LIPID MICROSPHERE
1.0000 mL | INTRAVENOUS | Status: AC | PRN
  Administered 2018-06-21: 3.5 mL via INTRAVENOUS
  Filled 2018-06-21: qty 10

## 2018-06-21 MED ORDER — UMECLIDINIUM-VILANTEROL 62.5-25 MCG/INH IN AEPB
1.0000 | INHALATION_SPRAY | Freq: Every day | RESPIRATORY_TRACT | Status: DC | PRN
  Filled 2018-06-21: qty 14

## 2018-06-21 MED ORDER — NITROGLYCERIN 5 MG/ML IV SOLN
INTRAVENOUS | Status: AC
  Filled 2018-06-21: qty 10

## 2018-06-21 MED ORDER — ONDANSETRON HCL 4 MG/2ML IJ SOLN: 4.0000 mg | Freq: Four times a day (QID) | INTRAMUSCULAR | Status: DC | PRN

## 2018-06-21 MED ORDER — HYDROCHLOROTHIAZIDE 25 MG PO TABS
25.0000 mg | ORAL_TABLET | Freq: Every day | ORAL | Status: DC
  Administered 2018-06-21 – 2018-06-24 (×4): 25 mg via ORAL
  Filled 2018-06-21 (×4): qty 1

## 2018-06-21 MED ORDER — VERAPAMIL HCL 2.5 MG/ML IV SOLN
INTRAVENOUS | Status: AC
  Filled 2018-06-21: qty 2

## 2018-06-21 MED ORDER — BOOST PLUS PO LIQD
237.0000 mL | ORAL | Status: DC
  Administered 2018-06-22 – 2018-06-24 (×2): 237 mL via ORAL
  Filled 2018-06-21: qty 237

## 2018-06-21 MED ORDER — GABAPENTIN 400 MG PO CAPS
400.0000 mg | ORAL_CAPSULE | Freq: Every day | ORAL | Status: DC
  Administered 2018-06-21 – 2018-06-23 (×3): 400 mg via ORAL
  Filled 2018-06-21 (×3): qty 1

## 2018-06-21 MED ORDER — ADULT MULTIVITAMIN W/MINERALS CH
1.0000 | ORAL_TABLET | Freq: Every day | ORAL | Status: DC
  Administered 2018-06-21 – 2018-06-24 (×4): 1 via ORAL
  Filled 2018-06-21 (×4): qty 1

## 2018-06-21 MED ORDER — TIROFIBAN (AGGRASTAT) BOLUS VIA INFUSION
25.0000 ug/kg | Freq: Once | INTRAVENOUS | Status: DC
  Filled 2018-06-21: qty 69

## 2018-06-21 MED ORDER — FENTANYL CITRATE (PF) 100 MCG/2ML IJ SOLN
INTRAMUSCULAR | Status: DC | PRN
  Administered 2018-06-21: 50 ug via INTRAVENOUS

## 2018-06-21 MED ORDER — TICAGRELOR 90 MG PO TABS
ORAL_TABLET | ORAL | Status: AC
  Filled 2018-06-21: qty 2

## 2018-06-21 MED ORDER — TIROFIBAN HCL IN NACL 5-0.9 MG/100ML-% IV SOLN
0.1500 ug/kg/min | INTRAVENOUS | Status: AC
  Administered 2018-06-21 (×2): 0.15 ug/kg/min via INTRAVENOUS
  Filled 2018-06-21 (×7): qty 100

## 2018-06-21 MED ORDER — TICAGRELOR 90 MG PO TABS
ORAL_TABLET | ORAL | Status: DC | PRN
  Administered 2018-06-21: 180 mg via ORAL

## 2018-06-21 SURGICAL SUPPLY — 15 items
BALLN TREK RX 2.75X15 (BALLOONS) ×3
BALLOON TREK RX 2.75X15 (BALLOONS) ×1 IMPLANT
CATH INFINITI 5FR JL4 (CATHETERS) ×3 IMPLANT
CATH INFINITI 5FR TG (CATHETERS) ×3 IMPLANT
CATH LAUNCHER 6FR JR4 (CATHETERS) ×3 IMPLANT
CATH PRIORITY ONE AC 6F (CATHETERS) ×3 IMPLANT
DEVICE INFLAT 30 PLUS (MISCELLANEOUS) ×3 IMPLANT
DEVICE RAD COMP TR BAND LRG (VASCULAR PRODUCTS) ×3 IMPLANT
GLIDESHEATH SLEND SS 6F .021 (SHEATH) ×3 IMPLANT
KIT MANI 3VAL PERCEP (MISCELLANEOUS) ×3 IMPLANT
PACK CARDIAC CATH (CUSTOM PROCEDURE TRAY) ×3 IMPLANT
STENT RESOLUTE ONYX 4.0X22 (Permanent Stent) ×3 IMPLANT
STENT RESOLUTE ONYX 4.0X38 (Permanent Stent) ×3 IMPLANT
WIRE ROSEN-J .035X260CM (WIRE) ×3 IMPLANT
WIRE RUNTHROUGH .014X180CM (WIRE) ×6 IMPLANT

## 2018-06-21 NOTE — ED Triage Notes (Signed)
EMS pt to Rm [facility] from [facility] with report of chest pain that started around 4 am. EMS EKG show STEMI.

## 2018-06-21 NOTE — ED Notes (Signed)
Pt ready for transport to cath lab. Awaiting Dr Kirke Corin direction to go.

## 2018-06-21 NOTE — ED Notes (Signed)
Pt to cath lab.

## 2018-06-21 NOTE — ED Provider Notes (Signed)
[facility] Emergency Department Provider Note  ____________________________________________   First MD Initiated Contact with Patient 06/21/18 [phone]     (approximate)  I have reviewed the triage vital signs and the nursing notes.   HISTORY  Chief Complaint Code STEMI   HPI Dylan [name] is a 82 y.o. male who comes to the emergency department via EMS with epigastric discomfort associated with nausea and chest pressure.  [facility] residing [facility] for [facility] and [facility].  His pain began suddenly around 4 AM and he was given 3 tablets of nitroglycerin at his rehab facility with no relief.  When EMS arrived they gave him 324 mg of aspirin and her EKG showed an inferior STEMI.  The patient's pain is gradual onset severe nonradiating associated with nausea and diaphoresis.  Nothing seems to make it better or worse.  He does have a long history of coronary artery disease and congestive heart failure.  His cardiologist is Dr. Welton Flakes.  He takes a baby aspirin every day but no Plavix or Brilinta.  He takes no blood thinning medication.    Past Medical History:  Diagnosis Date  . CAD (coronary artery disease)   . Cervicalgia   . CHF (congestive heart failure) (HCC)   . COPD (chronic obstructive pulmonary disease) (HCC)   . Diastolic heart failure (HCC)   . Foot drop, right   . History of kidney stones   . Hyperlipidemia    unspecified  . Hypertension   . Myocardial infarction (HCC)   . Osteoarthritis   . Shoulder pain, left   . Sleep apnea   . Tremor, essential     Patient Active Problem List   Diagnosis Date Noted  . S/P total knee arthroplasty 06/01/2018  . Primary osteoarthritis of left knee 02/05/2018  . Sepsis (HCC) 01/10/2018  . Pressure injury of skin 12/02/2017  . Community acquired pneumonia 12/01/2017  . Pneumonia 12/01/2017  . Hypertension 09/23/2017  . Hyperlipidemia 09/23/2017  . Hematoma of leg, right,  subsequent encounter 09/22/2017  . Blood blister 09/22/2017  . Weakness 08/31/2017  . Benign essential tremor 10/18/2016  . Class 3 severe obesity due to excess calories with serious comorbidity and body mass index (BMI) of 45.0 to 49.9 in adult (HCC) 10/18/2016  . Varicose veins of lower extremities with ulcer (HCC) 06/17/2016  . Chronic venous insufficiency 06/17/2016  . Lymphedema 06/17/2016  . Acute on chronic diastolic CHF (congestive heart failure) (HCC) 06/10/2016  . COPD, severe (HCC) 05/24/2016  . AV block, Mobitz 1 02/09/2016  . Coronary atherosclerosis of native coronary artery 02/09/2016  . ST elevation MI (STEMI) (HCC) 02/08/2016  . CHF (congestive heart failure) (HCC) 12/15/2015  . Right foot drop 04/07/2014  . Rotator cuff rupture, complete 04/22/2013  . Neck pain 04/14/2013    Past Surgical History:  Procedure Laterality Date  . APPLICATION OF WOUND VAC Right 11/13/2017   Procedure: APPLICATION OF WOUND VAC;  Surgeon: Annice Needy, MD;  Location: [facility] ORS;  Service: Vascular;  Laterality: Right;  . BACK SURGERY  06/2010  . CARDIAC CATHETERIZATION Left 04/30/2016   Procedure: Left Heart Cath and Coronary Angiography;  Surgeon: Laurier Nancy, MD;  Location: [facility] INVASIVE CV LAB;  Service: Cardiovascular;  Laterality: Left;  . COLONOSCOPY    . CORONARY ANGIOPLASTY    . KNEE ARTHROPLASTY Left 06/01/2018   Procedure: COMPUTER ASSISTED TOTAL KNEE ARTHROPLASTY;  Surgeon: Donato Heinz, MD;  Location: [facility] ORS;  Service: Orthopedics;  Laterality: Left;  . KNEE ARTHROSCOPY Left   . KNEE SURGERY Left 1998  . TONSILLECTOMY    . WOUND DEBRIDEMENT Right 11/13/2017   Procedure: DEBRIDEMENT WOUND;  Surgeon: Annice Needy, MD;  Location: [facility] ORS;  Service: Vascular;  Laterality: Right;    Prior to Admission medications   Medication Sig Start Date End Date Taking? Authorizing Provider  acetaminophen (TYLENOL) 500 MG tablet Take 1,000 mg by mouth 2 (two) times daily as needed for  moderate pain.    [provider]  Ascorbic Acid (VITAMIN C PO) Take 1 tablet by mouth daily.    [provider]  atorvastatin (LIPITOR) 40 MG tablet Take 40 mg by mouth every evening.    [provider]  carvedilol (COREG) 12.5 MG tablet Take 12.5 mg by mouth 2 (two) times daily.  04/24/18   [provider]  docusate sodium (COLACE) 100 MG capsule Take 300 mg by mouth at bedtime.     [provider]  enoxaparin (LOVENOX) 30 MG/0.3ML injection Inject 0.3 mLs (30 mg total) into the skin every 12 (twelve) hours for 14 days. 06/04/18 06/18/18  Tera Partridge, PA  gabapentin (NEURONTIN) 400 MG capsule Take 400 mg by mouth at bedtime.    [provider]  guaiFENesin (MUCUS-ER PO) Take 1 tablet by mouth every 12 (twelve) hours as needed.    [provider]  hydrochlorothiazide (HYDRODIURIL) 25 MG tablet Take 25 mg by mouth daily.  03/23/18   [provider]  ipratropium-albuterol (DUONEB) 0.5-2.5 (3) MG/3ML SOLN Take 3 mLs by nebulization every 6 (six) hours as needed (shortness of breath).     [provider]  isosorbide mononitrate (IMDUR) 30 MG 24 hr tablet Take 30 mg by mouth daily.  05/31/16   [provider]  ketotifen (ZADITOR) 0.025 % ophthalmic solution Place 1 drop into both eyes 2 (two) times daily. 01/14/18   Alford Highland, MD  lactose free nutrition (BOOST) LIQD Take 237 mLs by mouth 3 (three) times a week.     [provider]  lisinopril (PRINIVIL,ZESTRIL) 5 MG tablet Take 5 mg by mouth daily.  03/28/18   [provider]  loratadine (CLARITIN) 10 MG tablet Take 10 mg by mouth daily as needed for allergies.    [provider]  Multiple Vitamins-Minerals (CENTROVITE) TABS Take 1 tablet by mouth daily.    [provider]  nitroGLYCERIN (NITROSTAT) 0.3 MG SL tablet Place 0.3 mg under the tongue every 5 (five) minutes as needed for chest pain.    [provider]    oxyCODONE (OXY IR/ROXICODONE) 5 MG immediate release tablet Take 1 tablet (5 mg total) by mouth every 4 (four) hours as needed for moderate pain (pain score 4-6). 06/02/18   Tera Partridge, PA  potassium chloride (K-DUR,KLOR-CON) 10 MEQ tablet Take 10 mEq by mouth daily. May take an additional 10 meq dose as needed for swelling    [provider]  POTASSIUM GLUCONATE PO Take 1 capsule by mouth daily.    [provider]  primidone (MYSOLINE) 50 MG tablet Take 100 mg by mouth 2 (two) times daily.     [provider]  torsemide (DEMADEX) 20 MG tablet Take 20 mg by mouth daily.     [provider]  traMADol (ULTRAM) 50 MG tablet Take 1-2 tablets (50-100 mg total) by mouth every 4 (four) hours as needed for moderate pain. 06/02/18   Tera Partridge, PA  umeclidinium-vilanterol (ANORO ELLIPTA) 62.5-25 MCG/INH AEPB Inhale 1 puff into the lungs daily. Patient taking differently: Inhale 1 puff into the lungs daily as needed (shortness of breath).  09/04/17   Shaune Pollack, MD    Allergies Patient has no known allergies.  Family History  Problem Relation Age of Onset  . Diabetes Son   . Cancer Mother   . Colon cancer Mother   . Lung cancer Mother   . Tremor Father   . Heart disease Father   . Tremor Brother   . Bladder Cancer Brother   . Tremor Sister     Social History Social History   Tobacco Use  . Smoking status: Former Smoker    Packs/day: 0.25    Years: 58.00    Pack years: 14.50    Types: Cigarettes  . Smokeless tobacco: Former Neurosurgeon  . Tobacco comment: quit the begginning of septembet 2019  Substance Use Topics  . Alcohol use: No    Alcohol/week: 0.0 standard drinks  . Drug use: No    Review of Systems Constitutional: No fever/chills Eyes: No visual changes. ENT: No sore throat. Cardiovascular: Positive for chest pain. Respiratory: Positive for shortness of breath. Gastrointestinal: No abdominal pain.  Positive for nausea, no vomiting.  No  diarrhea.  No constipation. Genitourinary: Negative for dysuria. Musculoskeletal: Negative for back pain. Skin: Negative for rash. Neurological: Negative for headaches, focal weakness or numbness.   ____________________________________________   PHYSICAL EXAM:  VITAL SIGNS: ED Triage Vitals  Enc Vitals Group     BP 06/21/18 0602 (!) 151/96     Pulse Rate 06/21/18 0602 62     Resp 06/21/18 0602 14     Temp 06/21/18 0602 97.8 F (36.6 C)     Temp Source 06/21/18 0602 Oral     SpO2 06/21/18 0602 100 %     Weight --      Height --      Head Circumference --      [facility] Flow --      Pain Score 06/21/18 0603 7     Pain Loc --      Pain Edu? --      Excl. in GC? --     Constitutional: Alert and oriented x4 appears anxious and unwell.  Diaphoretic Eyes: PERRL EOMI. Head: Atraumatic. Nose: No congestion/rhinnorhea. Mouth/Throat: No trismus Neck: No stridor.   Cardiovascular: Normal rate, regular rhythm. Grossly normal heart sounds.  Good peripheral circulation. Respiratory: Increased respiratory effort.  No retractions. Lungs CTAB and moving good air Gastrointestinal: Morbidly obese Musculoskeletal: Legs equal in size Neurologic:  Normal speech and language. No gross focal neurologic deficits are appreciated. Skin: Diaphoretic Psychiatric: Anxious appearing    ____________________________________________   DIFFERENTIAL includes but not limited to  STEMI, non-STEMI, pericarditis, myocarditis, pulmonary embolism, aortic dissection ____________________________________________   LABS (all labs ordered are listed, but only abnormal results are displayed)  Labs Reviewed  TROPONIN I - Abnormal; Notable for the following components:      Result Value   Troponin I 0.11 (*)    All other components within normal limits  BRAIN NATRIURETIC PEPTIDE - Abnormal; Notable for the following components:   B Natriuretic Peptide 684.0 (*)    All other components within normal limits    COMPREHENSIVE METABOLIC PANEL - Abnormal; Notable for the following components:   Glucose, Bld 164 (*)    BUN 39 (*)    Creatinine, Ser 1.30 (*)    Albumin 3.3 (*)  GFR calc non Af Amer 50 (*)    GFR calc Af Amer 58 (*)    All other components within normal limits  CBC WITH DIFFERENTIAL/PLATELET - Abnormal; Notable for the following components:   RBC 3.71 (*)    Hemoglobin 11.9 (*)    HCT 37.1 (*)    RDW 17.2 (*)    Abs Immature Granulocytes 0.08 (*)    All other components within normal limits  APTT - Abnormal; Notable for the following components:   aPTT 39 (*)    All other components within normal limits    Lab work reviewed by me with elevated troponin consistent with acute myocardial ischemia __________________________________________  EKG  ED ECG REPORT I, Merrily Brittle, the attending physician, personally viewed and interpreted this ECG.  Date: 06/21/2018 EKG Time: 0602 Rate: 64 Rhythm: normal sinus rhythm QRS Axis: Leftward axis Intervals: Prolonged QTC ST/T Wave abnormalities: ST elevation in leads II, III and aVF with elevation 3 greater than 2 and reciprocal depression in leads I and aVL consistent with inferior STEMI Narrative Interpretation: Inferior STEMI  ED ECG REPORT I, Merrily Brittle, the attending physician, personally viewed and interpreted this ECG.  Date: 06/21/2018 EKG Time: 0602 Rate: 62 Rhythm: normal sinus rhythm QRS Axis: Leftward Intervals: normal ST/T Wave abnormalities: This is a right sided EKG that shows ST elevation in 2 3 aVF 3 greater than 2 depression in 1 and aVL no right-sided elevation consistent with STEMI but does not show right-sided STEMI Narrative Interpretation: Inferior STEMI  ED ECG REPORT I, Merrily Brittle, the attending physician, personally viewed and interpreted this ECG.  Date: 06/21/2018 EKG Time: 0610 Rate: 65 Rhythm: normal sinus rhythm QRS Axis: Leftward axis Intervals: normal ST/T Wave  abnormalities: This is a posterior EKG showing persistent ST elevation inferiorly now with posterior elevation Narrative Interpretation: Inferior and posterior STEMI  ____________________________________________  RADIOLOGY  ___________________________________________   PROCEDURES  Procedure(s) performed: no  .Critical Care Performed by: Merrily Brittle, MD Authorized by: Merrily Brittle, MD   Critical care provider statement:    Critical care time (minutes):  20   Critical care time was exclusive of:  Separately billable procedures and treating other patients   Critical care was necessary to treat or prevent imminent or life-threatening deterioration of the following conditions:  Cardiac failure   Critical care was time spent personally by me on the following activities:  Development of treatment plan with patient or surrogate, discussions with consultants, evaluation of patient's response to treatment, examination of patient, obtaining history from patient or surrogate, ordering and performing treatments and interventions, ordering and review of laboratory studies, ordering and review of radiographic studies, pulse oximetry, re-evaluation of patient's condition and review of old charts    Critical Care performed: Yes  ____________________________________________   INITIAL IMPRESSION / ASSESSMENT AND PLAN / ED COURSE  Pertinent labs & imaging results that were available during my care of the patient were reviewed by me and considered in my medical decision making (see chart for details).   As part of my medical decision making, I reviewed the following data within the electronic MEDICAL RECORD NUMBER History obtained from family if available, nursing notes, old chart and ekg, as well as notes from prior ED visits.  On arrival the patient is critically ill-appearing with a prehospital EKG consistent with STEMI.  Our EKG here confirms inferior STEMI with leads III greater than 2  concerning for right-sided involvement so I will bolus him a liter of fluid and  hold off on all nitroglycerin.  The patient already received aspirin and 50 mcg of fentanyl which did seem to help his pain somewhat.  We will bolus him 4000 units of heparin and the catheterization lab and cardiologist were activated from the field.  While we were awaiting a obtain right sided EKG which was negative for right-sided infarction however the posterior EKG was positive for posterior involvement.  Dr. Kirke Corin was at bedside quickly and he left the emergency department at 620 which was 23 minutes prior to arrival.  He remains critically ill.  I did discuss the case with the hospitalist Dr. Sheryle Hail to make him aware that the patient would be coming out of the Cath Lab shortly.      ____________________________________________   FINAL CLINICAL IMPRESSION(S) / ED DIAGNOSES  Final diagnoses:  ST elevation myocardial infarction (STEMI), unspecified artery (HCC)      NEW MEDICATIONS STARTED DURING THIS VISIT:  Current Discharge Medication List       Note:  This document was prepared using Dragon voice recognition software and may include unintentional dictation errors.     Merrily Brittle, MD 06/21/18 [phone]

## 2018-06-21 NOTE — Consult Note (Signed)
Reason for Consult: ST elevation MI, in ICU, assistance with CCM issues  Referring Physician: Boykin Reaper.  Dylan [name] is an 82 y.o. male.  HPI:   Dylan [name]  is an  82 y.o. male, current smoker,  with a known history of CAD, CHF, hypertension who had knee replacement three weeks ago and was in SNF for rehab when he noted epigastric pain, nausea and bloating at around 4 AM this morning. The patient recognize that the symptoms were like his prior myocardial infarction. He was taken to the emergency room at [facility] where he  was found to have ST elevation MI and cardiology was consulted.  Patient was taken to Cath Lab and had 2 stents placed in the RCA. Patient is chest pain-free at this time. He is currently in the intensive care unit undergoing post STEMI and PCI management. The patient is currently hemodynamically stable.   Past Medical History:  Diagnosis Date  . CAD (coronary artery disease)   . Cervicalgia   . CHF (congestive heart failure) ([place])   . COPD (chronic obstructive pulmonary disease) ([place])   . Diastolic heart failure ([place])   . Foot drop, right   . History of kidney stones   . Hyperlipidemia    unspecified  . Hypertension   . Myocardial infarction ([place])   . Osteoarthritis   . Shoulder pain, left   . Sleep apnea   . Tremor, essential     Past Surgical History:  Procedure Laterality Date  . APPLICATION OF WOUND VAC Right 11/13/2017   Procedure: APPLICATION OF WOUND VAC;  Surgeon: Algernon Huxley, MD;  Location: [facility] ORS;  Service: Vascular;  Laterality: Right;  . BACK SURGERY  06/2010  . CARDIAC CATHETERIZATION Left 04/30/2016   Procedure: Left Heart Cath and Coronary Angiography;  Surgeon: Dionisio David, MD;  Location: [facility];  Service: Cardiovascular;  Laterality: Left;  . COLONOSCOPY    . CORONARY ANGIOPLASTY    . KNEE ARTHROPLASTY Left 06/01/2018   Procedure: COMPUTER ASSISTED TOTAL KNEE ARTHROPLASTY;  Surgeon: Dereck Leep, MD;  Location: [facility] ORS;   Service: Orthopedics;  Laterality: Left;  . KNEE ARTHROSCOPY Left   . KNEE SURGERY Left 1998  . TONSILLECTOMY    . WOUND DEBRIDEMENT Right 11/13/2017   Procedure: DEBRIDEMENT WOUND;  Surgeon: Algernon Huxley, MD;  Location: [facility] ORS;  Service: Vascular;  Laterality: Right;    Family History  Problem Relation Age of Onset  . Diabetes Son   . Cancer Mother   . Colon cancer Mother   . Lung cancer Mother   . Tremor Father   . Heart disease Father   . Tremor Brother   . Bladder Cancer Brother   . Tremor Sister     Social History:  reports that he has quit smoking. His smoking use included cigarettes. He has a 14.50 pack-year smoking history. He has quit using smokeless tobacco. He reports that he does not drink alcohol or use drugs. though he reported above that he had quit smoking he is actually still smoking 1 to 2 cigarettes per day. He has smoked for quite a number of years.   Allergies: No Known Allergies  Medications: I have reviewed the patient's current medications.  Results for orders placed or performed during the hospital encounter of 06/21/18 (from the past 48 hour(s))  Troponin I     Status: Abnormal   Collection Time: 06/21/18  6:02 AM  Result Value Ref Range   Troponin  I 0.11 (HH) <0.03 ng/mL    Comment: CRITICAL RESULT CALLED TO, READ BACK BY AND VERIFIED WITH ANN CALES AT 5361 ON 06/21/18 BY SNJ Performed at [facility], [address] [place], [place] [address]   Comprehensive metabolic panel     Status: Abnormal   Collection Time: 06/21/18  6:02 AM  Result Value Ref Range   Sodium 143 135 - 145 mmol/L   Potassium 3.9 3.5 - 5.1 mmol/L   Chloride 105 98 - 111 mmol/L   CO2 31 22 - 32 mmol/L   Glucose, Bld 164 (H) 70 - 99 mg/dL   BUN 39 (H) 8 - 23 mg/dL   Creatinine, Ser 1.30 (H) 0.61 - 1.24 mg/dL   Calcium 8.9 8.9 - 10.3 mg/dL   Total Protein 6.7 6.5 - 8.1 g/dL   Albumin 3.3 (L) 3.5 - 5.0 g/dL   AST 31 15 - 41 U/L   ALT 24 0 - 44 U/L   Alkaline  Phosphatase 97 38 - 126 U/L   Total Bilirubin 0.7 0.3 - 1.2 mg/dL   GFR calc non Af Amer 50 (L) >60 mL/min   GFR calc Af Amer 58 (L) >60 mL/min    Comment: (NOTE) The eGFR has been calculated using the CKD EPI equation. This calculation has not been validated in all clinical situations. eGFR's persistently <60 mL/min signify possible Chronic Kidney Disease.    Anion gap 7 5 - 15    Comment: Performed at [facility], [address] [place], [place] [address]  CBC with Differential     Status: Abnormal   Collection Time: 06/21/18  6:02 AM  Result Value Ref Range   WBC 8.8 4.0 - 10.5 K/uL   RBC 3.71 (L) 4.22 - 5.81 MIL/uL   Hemoglobin 11.9 (L) 13.0 - 17.0 g/dL   HCT 37.1 (L) 39.0 - 52.0 %   MCV 100.0 80.0 - 100.0 fL   MCH 32.1 26.0 - 34.0 pg   MCHC 32.1 30.0 - 36.0 g/dL   RDW 17.2 (H) 11.5 - 15.5 %   Platelets 226 150 - 400 K/uL   nRBC 0.0 0.0 - 0.2 %   Neutrophils Relative % 62 %   Neutro Abs 5.6 1.7 - 7.7 K/uL   Lymphocytes Relative 22 %   Lymphs Abs 1.9 0.7 - 4.0 K/uL   Monocytes Relative 10 %   Monocytes Absolute 0.8 0.1 - 1.0 K/uL   Eosinophils Relative 4 %   Eosinophils Absolute 0.3 0.0 - 0.5 K/uL   Basophils Relative 1 %   Basophils Absolute 0.1 0.0 - 0.1 K/uL   Immature Granulocytes 1 %   Abs Immature Granulocytes 0.08 (H) 0.00 - 0.07 K/uL    Comment: Performed at [facility], [address] [place], [place] [address]  APTT     Status: Abnormal   Collection Time: 06/21/18  6:02 AM  Result Value Ref Range   aPTT 39 (H) 24 - 36 seconds    Comment:        IF BASELINE aPTT IS ELEVATED, SUGGEST PATIENT RISK ASSESSMENT BE USED TO DETERMINE APPROPRIATE ANTICOAGULANT THERAPY. Performed at [facility], [address] [place], [place] [address]   Brain natriuretic peptide     Status: Abnormal   Collection Time: 06/21/18  6:05 AM  Result Value Ref Range   B Natriuretic Peptide 684.0 (H) 0.0 - 100.0 pg/mL    Comment: Performed at  [facility], [address] [place],  [place] [address]  POCT Activated clotting time     Status: None   Collection Time: 06/21/18  6:55 AM  Result Value Ref Range   Activated Clotting Time 296 seconds  POCT Activated clotting time     Status: None   Collection Time: 06/21/18  7:26 AM  Result Value Ref Range   Activated Clotting Time 274 seconds  MRSA PCR Screening     Status: Abnormal   Collection Time: 06/21/18  8:26 AM  Result Value Ref Range   MRSA by PCR POSITIVE (A) NEGATIVE    Comment:        The GeneXpert MRSA Assay (FDA approved for NASAL specimens only), is one component of a comprehensive MRSA colonization surveillance program. It is not intended to diagnose MRSA infection nor to guide or monitor treatment for MRSA infections. RESULT CALLED TO, READ BACK BY AND VERIFIED WITH: TANAILLY GIRALT AT 1004 ON 06/21/18 [place]. Performed at [facility], [address] [place], [place] [address]   Glucose, capillary     Status: Abnormal   Collection Time: 06/21/18  8:28 AM  Result Value Ref Range   Glucose-Capillary 141 (H) 70 - 99 mg/dL  Platelet count     Status: None   Collection Time: 06/21/18  1:11 PM  Result Value Ref Range   Platelets 215 150 - 400 K/uL    Comment: Performed at [facility], [address] [place],  [address]    No results found.  ROS Blood pressure 138/82, pulse 67, temperature (!) 97.5 F (36.4 C), temperature source Oral, resp. rate 12, height '5\' 10"'  (1.778 m), weight 129.1 kg, SpO2 100 %.   Physical Exam  Nursing note and vitals reviewed. Constitutional: He is oriented to person, place, and time. He appears well-developed. No distress.  Obese gentleman (40.8 BMI)  HENT:  Head: Normocephalic and atraumatic.  Right Ear: External ear normal.  Left Ear: External ear normal.  Mouth/Throat: Oropharynx is clear and moist. No oropharyngeal exudate.  Eyes: Pupils are equal, round, and reactive to light.  Conjunctivae are normal. No scleral icterus.  Neck: Neck supple. No tracheal deviation present. No thyromegaly present.  Thick neck cannot discern JVD.  Cardiovascular: Normal rate, regular rhythm, normal heart sounds and intact distal pulses. Exam reveals no gallop and no friction rub.  No murmur heard. Respiratory: Breath sounds normal. No respiratory distress. He has no wheezes. He has no rales.  GI: Soft. Bowel sounds are normal. He exhibits no distension.  Obese abdomen  Musculoskeletal: He exhibits no edema.  Left knee replacement surgery scar healing well no evidence of infection. Multiple Steri-Strips still in place.  Lymphadenopathy:    He has no cervical adenopathy.  Neurological: He is alert and oriented to person, place, and time.  Focal deficit noted.  Skin: Skin is warm and dry. He is not diaphoretic.  Psychiatric: He has a normal mood and affect. His behavior is normal. Thought content normal.      Assessment/Plan:  1) Acute ST elevation MI, status post stent placement in the RCA times two. Continue management per cardiology. Continue hemodynamic monitoring in the ICU after his cardiac intervention.  2) Morbid obesity, BMI 40.8, this issue adds complexity to his management. Weight loss is recommended.  3) Ongoing tobacco use, patient was canceled with regards to discontinuation of smoking.  4) Recent left knee replacement no evidence of infection. Continue PT/OT.      Dylan [name] 06/21/2018, 3:01 PM

## 2018-06-21 NOTE — Progress Notes (Signed)
Patient states he is experiencing moments of sleep apnea, he says he has a cpap at home he uses but has not been using it at [facility]. 2L nasal cannula place because patient hates to wear his mask. This nurse mentioned that if he continues to fell like he's having moments of apnea he will need to place a mask on. NP Marcos Eke made aware.

## 2018-06-21 NOTE — Progress Notes (Signed)
*  PRELIMINARY RESULTS* Echocardiogram 2D Echocardiogram has been performed. Definity IV Contrast used on this study.  Garrel Ridgel Naila Elizondo 06/21/2018, 1:15 PM

## 2018-06-21 NOTE — Progress Notes (Signed)
Chart reviewed and events noted, had PCI distal RCA after STEMI. Will f/u patient.Full note to follow.

## 2018-06-21 NOTE — NC FL2 (Signed)
[place] MEDICAID FL2 LEVEL OF CARE SCREENING TOOL     IDENTIFICATION  Patient Name: Dylan [name] Birthdate: Dec 06, 1935 Sex: male Admission Date (Current Location): 06/21/2018  [place] and [place] Number:  Chiropodist and Address:  [facility], [address] [place], [place] [address]      Provider Number: [id number]  Attending Physician Name and Address:  Arnaldo Natal, MD  Relative Name and Phone Number:  Ayden Hardwick (Spouse/HCPOA) [phone]. Eulis Salazar (Son) [phone], [facility] [place]) [phone]    Current Level of Care: Hospital Recommended Level of Care: Skilled Nursing Facility Prior Approval Number:    Date Approved/Denied:   PASRR Number: [id number]  Discharge Plan: SNF    Current Diagnoses: Patient Active Problem List   Diagnosis Date Noted  . Acute ST elevation myocardial infarction (STEMI) of inferior wall (HCC) 06/21/2018  . S/P total knee arthroplasty 06/01/2018  . Primary osteoarthritis of left knee 02/05/2018  . Sepsis (HCC) 01/10/2018  . Pressure injury of skin 12/02/2017  . Community acquired pneumonia 12/01/2017  . Pneumonia 12/01/2017  . Hypertension 09/23/2017  . Hyperlipidemia 09/23/2017  . Hematoma of leg, right, subsequent encounter 09/22/2017  . Blood blister 09/22/2017  . Weakness 08/31/2017  . Benign essential tremor 10/18/2016  . Class 3 severe obesity due to excess calories with serious comorbidity and body mass index (BMI) of 45.0 to 49.9 in adult (HCC) 10/18/2016  . Varicose veins of lower extremities with ulcer (HCC) 06/17/2016  . Chronic venous insufficiency 06/17/2016  . Lymphedema 06/17/2016  . Acute on chronic diastolic CHF (congestive heart failure) (HCC) 06/10/2016  . COPD, severe (HCC) 05/24/2016  . AV block, [place] 1 02/09/2016  . Coronary atherosclerosis of native coronary artery 02/09/2016  . ST elevation MI (STEMI) (HCC) 02/08/2016  . CHF (congestive heart  failure) (HCC) 12/15/2015  . Right foot drop 04/07/2014  . Rotator cuff rupture, complete 04/22/2013  . Neck pain 04/14/2013    Orientation RESPIRATION BLADDER Height & Weight     Self, Time, Situation, Place  O2(2L o2) Continent Weight: 284 lb 9.8 oz (129.1 kg) Height:  5\' 10"  (177.8 cm)  BEHAVIORAL SYMPTOMS/MOOD NEUROLOGICAL BOWEL NUTRITION STATUS      Continent Diet(Heart healthy)  AMBULATORY STATUS COMMUNICATION OF NEEDS Skin   Extensive Assist Verbally Normal                       Personal Care Assistance Level of Assistance  Bathing, Feeding, Dressing Bathing Assistance: Limited assistance Feeding assistance: Independent Dressing Assistance: Limited assistance     Functional Limitations Info    Sight Info: Adequate Hearing Info: Impaired Speech Info: Adequate    SPECIAL CARE FACTORS FREQUENCY  PT (By licensed PT), OT (By licensed OT)     PT Frequency: Up to 5X per week OT Frequency: Up to 3X per week            Contractures Contractures Info: Not present    Additional Factors Info  Code Status, Allergies Code Status Info: Partial (DNI) Allergies Info: No Known Allergies           Current Medications (06/21/2018):  This is the current hospital active medication list Current Facility-Administered Medications  Medication Dose Route Frequency Provider Last Rate Last Dose  . 0.9 %  sodium chloride infusion   Intravenous Continuous Arida, Muhammad A, MD      . 0.9 %  sodium chloride infusion  250 mL Intravenous PRN Lorine Bears A,  MD      . acetaminophen (TYLENOL) tablet 650 mg  650 mg Oral Q4H PRN Lorine Bears A, MD      . aspirin chewable tablet 81 mg  81 mg Oral Daily Lorine Bears A, MD   81 mg at 06/21/18 0906  . atorvastatin (LIPITOR) tablet 40 mg  40 mg Oral QPM Arida, Muhammad A, MD      . carvedilol (COREG) tablet 6.25 mg  6.25 mg Oral BID WC Lorine Bears A, MD   6.25 mg at 06/21/18 0932  . docusate sodium (COLACE) capsule 300 mg   300 mg Oral QHS Iran Ouch, MD      . Melene Muller ON 06/22/2018] enoxaparin (LOVENOX) injection 40 mg  40 mg Subcutaneous Q24H Arida, Muhammad A, MD      . gabapentin (NEURONTIN) capsule 400 mg  400 mg Oral QHS Arida, Muhammad A, MD      . hydrochlorothiazide (HYDRODIURIL) tablet 25 mg  25 mg Oral Daily Iran Ouch, MD   25 mg at 06/21/18 0906  . ipratropium-albuterol (DUONEB) 0.5-2.5 (3) MG/3ML nebulizer solution 3 mL  3 mL Nebulization Q6H PRN Lorine Bears A, MD      . isosorbide mononitrate (IMDUR) 24 hr tablet 30 mg  30 mg Oral Daily Lorine Bears A, MD   30 mg at 06/21/18 0906  . ketotifen (ZADITOR) 0.025 % ophthalmic solution 1 drop  1 drop Both Eyes BID Iran Ouch, MD      . Melene Muller ON 06/22/2018] lactose free nutrition (BOOST PLUS) liquid 237 mL  237 mL Oral Once per day on Mon Wed Fri Arida, Muhammad A, MD      . lisinopril (PRINIVIL,ZESTRIL) tablet 5 mg  5 mg Oral Daily Iran Ouch, MD   5 mg at 06/21/18 0906  . loratadine (CLARITIN) tablet 10 mg  10 mg Oral Daily PRN Iran Ouch, MD      . multivitamin with minerals tablet 1 tablet  1 tablet Oral Daily Iran Ouch, MD   1 tablet at 06/21/18 0905  . nitroGLYCERIN (NITROSTAT) SL tablet 0.4 mg  0.4 mg Sublingual Q5 min PRN Lorine Bears A, MD      . ondansetron (ZOFRAN) injection 4 mg  4 mg Intravenous Q6H PRN Lorine Bears A, MD      . oxyCODONE (Oxy IR/ROXICODONE) immediate release tablet 5 mg  5 mg Oral Q4H PRN Lorine Bears A, MD      . potassium chloride SA (K-DUR,KLOR-CON) CR tablet 10 mEq  10 mEq Oral Daily Iran Ouch, MD   10 mEq at 06/21/18 0906  . primidone (MYSOLINE) tablet 100 mg  100 mg Oral BID Lorine Bears A, MD   100 mg at 06/21/18 0905  . sodium chloride flush (NS) 0.9 % injection 3 mL  3 mL Intravenous Q12H Lorine Bears A, MD   3 mL at 06/21/18 1200  . sodium chloride flush (NS) 0.9 % injection 3 mL  3 mL Intravenous PRN Iran Ouch, MD      . ticagrelor  (BRILINTA) tablet 90 mg  90 mg Oral BID Lorine Bears A, MD      . tirofiban (AGGRASTAT) bolus via infusion 3,430 mcg  25 mcg/kg Intravenous Once Lorine Bears A, MD      . tirofiban (AGGRASTAT) infusion 50 mcg/mL 100 mL  0.15 mcg/kg/min Intravenous Continuous Lorine Bears A, MD      . traMADol (ULTRAM) tablet 50-100 mg  50-100 mg Oral  Q4H PRN Iran Ouch, MD      . umeclidinium-vilanterol (ANORO ELLIPTA) 62.5-25 MCG/INH 1 puff  1 puff Inhalation Daily PRN Iran Ouch, MD         Discharge Medications: Please see discharge summary for a list of discharge medications.  Relevant Imaging Results:  Relevant Lab Results:   Additional Information SS# [id number]  Judi Cong, LCSW

## 2018-06-21 NOTE — Progress Notes (Signed)
  Advance care planning  Purpose of Encounter STEMI, Code status  Parties in Attendance Patient and wife  Patients Decisional capacity Patient is alert and oriented.  Able to make medical decisions.  Wife is documented healthcare power of attorney.  This regarding patient's ST elevation MI, prognosis and treatment plan.  Patient appreciative of drug-eluting stents placed emergently.  Discussed regarding CODE STATUS and patient wants to be partial code with no intubation or ventilatory support.  He does request that we pursue CPR and defibrillation if needed.  Time spent - 17 minutes

## 2018-06-21 NOTE — H&P (Signed)
[employer] -  at [facility]   PATIENT NAME: Dylan [name]    MR#:  [id number]  DATE OF BIRTH:  08-03-1936  DATE OF ADMISSION:  06/21/2018  PRIMARY CARE PHYSICIAN: [name], MD   REQUESTING/REFERRING PHYSICIAN: Dr. Kirke Corin  CHIEF COMPLAINT:   Chief Complaint  Patient presents with  . Code STEMI    HISTORY OF PRESENT ILLNESS:  Dylan [name]  is a 82 y.o. male with a known history of CAD, CHF, hypertension who recently had knee replacement and was in skilled nursing facility for rehab presented to the hospital with epigastric pain, nausea and bloating.  Was found to have ST elevation MI and cardiology was consulted.  Patient was taken to Cath Lab and had 2 stents placed in RCA. Patient is chest pain-free at this time.  Admitted to ICU.  PAST MEDICAL HISTORY:   Past Medical History:  Diagnosis Date  . CAD (coronary artery disease)   . Cervicalgia   . CHF (congestive heart failure) (HCC)   . COPD (chronic obstructive pulmonary disease) (HCC)   . Diastolic heart failure (HCC)   . Foot drop, right   . History of kidney stones   . Hyperlipidemia    unspecified  . Hypertension   . Myocardial infarction (HCC)   . Osteoarthritis   . Shoulder pain, left   . Sleep apnea   . Tremor, essential     PAST SURGICAL HISTORY:   Past Surgical History:  Procedure Laterality Date  . APPLICATION OF WOUND VAC Right 11/13/2017   Procedure: APPLICATION OF WOUND VAC;  Surgeon: Annice Needy, MD;  Location: [facility] ORS;  Service: Vascular;  Laterality: Right;  . BACK SURGERY  06/2010  . CARDIAC CATHETERIZATION Left 04/30/2016   Procedure: Left Heart Cath and Coronary Angiography;  Surgeon: Laurier Nancy, MD;  Location: [facility] INVASIVE CV LAB;  Service: Cardiovascular;  Laterality: Left;  . COLONOSCOPY    . CORONARY ANGIOPLASTY    . KNEE ARTHROPLASTY Left 06/01/2018   Procedure: COMPUTER ASSISTED TOTAL KNEE ARTHROPLASTY;  Surgeon: Donato Heinz, MD;  Location: [facility] ORS;   Service: Orthopedics;  Laterality: Left;  . KNEE ARTHROSCOPY Left   . KNEE SURGERY Left 1998  . TONSILLECTOMY    . WOUND DEBRIDEMENT Right 11/13/2017   Procedure: DEBRIDEMENT WOUND;  Surgeon: Annice Needy, MD;  Location: [facility] ORS;  Service: Vascular;  Laterality: Right;    SOCIAL HISTORY:   Social History   Tobacco Use  . Smoking status: Former Smoker    Packs/day: 0.25    Years: 58.00    Pack years: 14.50    Types: Cigarettes  . Smokeless tobacco: Former Neurosurgeon  . Tobacco comment: quit the begginning of septembet 2019  Substance Use Topics  . Alcohol use: No    Alcohol/week: 0.0 standard drinks    FAMILY HISTORY:   Family History  Problem Relation Age of Onset  . Diabetes Son   . Cancer Mother   . Colon cancer Mother   . Lung cancer Mother   . Tremor Father   . Heart disease Father   . Tremor Brother   . Bladder Cancer Brother   . Tremor Sister     DRUG ALLERGIES:  No Known Allergies  REVIEW OF SYSTEMS:   Review of Systems  Constitutional: Positive for malaise/fatigue. Negative for chills and fever.  HENT: Negative for sore throat.   Eyes: Negative for blurred vision, double vision and pain.  Respiratory: Negative for cough, hemoptysis,  shortness of breath and wheezing.   Cardiovascular: Positive for chest pain. Negative for palpitations, orthopnea and leg swelling.  Gastrointestinal: Negative for abdominal pain, constipation, diarrhea, heartburn, nausea and vomiting.  Genitourinary: Negative for dysuria and hematuria.  Musculoskeletal: Negative for back pain and joint pain.  Skin: Negative for rash.  Neurological: Negative for sensory change, speech change, focal weakness and headaches.  Endo/Heme/Allergies: Does not bruise/bleed easily.  Psychiatric/Behavioral: Negative for depression. The patient is not nervous/anxious.     MEDICATIONS AT HOME:   Prior to Admission medications   Medication Sig Start Date End Date Taking? Authorizing Provider   acetaminophen (TYLENOL) 500 MG tablet Take 1,000 mg by mouth 2 (two) times daily as needed for moderate pain.    [provider]  Ascorbic Acid (VITAMIN C PO) Take 1 tablet by mouth daily.    [provider]  atorvastatin (LIPITOR) 40 MG tablet Take 40 mg by mouth every evening.    [provider]  carvedilol (COREG) 12.5 MG tablet Take 12.5 mg by mouth 2 (two) times daily.  04/24/18   [provider]  docusate sodium (COLACE) 100 MG capsule Take 300 mg by mouth at bedtime.     [provider]  enoxaparin (LOVENOX) 30 MG/0.3ML injection Inject 0.3 mLs (30 mg total) into the skin every 12 (twelve) hours for 14 days. 06/04/18 06/18/18  Tera Partridge, PA  gabapentin (NEURONTIN) 400 MG capsule Take 400 mg by mouth at bedtime.    [provider]  guaiFENesin (MUCUS-ER PO) Take 1 tablet by mouth every 12 (twelve) hours as needed.    [provider]  hydrochlorothiazide (HYDRODIURIL) 25 MG tablet Take 25 mg by mouth daily.  03/23/18   [provider]  ipratropium-albuterol (DUONEB) 0.5-2.5 (3) MG/3ML SOLN Take 3 mLs by nebulization every 6 (six) hours as needed (shortness of breath).     [provider]  isosorbide mononitrate (IMDUR) 30 MG 24 hr tablet Take 30 mg by mouth daily.  05/31/16   [provider]  ketotifen (ZADITOR) 0.025 % ophthalmic solution Place 1 drop into both eyes 2 (two) times daily. 01/14/18   Alford Highland, MD  lactose free nutrition (BOOST) LIQD Take 237 mLs by mouth 3 (three) times a week.     [provider]  lisinopril (PRINIVIL,ZESTRIL) 5 MG tablet Take 5 mg by mouth daily.  03/28/18   [provider]  loratadine (CLARITIN) 10 MG tablet Take 10 mg by mouth daily as needed for allergies.    [provider]  Multiple Vitamins-Minerals (CENTROVITE) TABS Take 1 tablet by mouth daily.    [provider]  nitroGLYCERIN (NITROSTAT) 0.3 MG SL tablet Place 0.3 mg  under the tongue every 5 (five) minutes as needed for chest pain.    [provider]  oxyCODONE (OXY IR/ROXICODONE) 5 MG immediate release tablet Take 1 tablet (5 mg total) by mouth every 4 (four) hours as needed for moderate pain (pain score 4-6). 06/02/18   Tera Partridge, PA  potassium chloride (K-DUR,KLOR-CON) 10 MEQ tablet Take 10 mEq by mouth daily. May take an additional 10 meq dose as needed for swelling    [provider]  POTASSIUM GLUCONATE PO Take 1 capsule by mouth daily.    [provider]  primidone (MYSOLINE) 50 MG tablet Take 100 mg by mouth 2 (two) times daily.     [provider]  torsemide (DEMADEX) 20 MG tablet Take 20 mg by mouth daily.  [provider]  traMADol (ULTRAM) 50 MG tablet Take 1-2 tablets (50-100 mg total) by mouth every 4 (four) hours as needed for moderate pain. 06/02/18   Tera Partridge, PA  umeclidinium-vilanterol (ANORO ELLIPTA) 62.5-25 MCG/INH AEPB Inhale 1 puff into the lungs daily. Patient taking differently: Inhale 1 puff into the lungs daily as needed (shortness of breath).  09/04/17   Shaune Pollack, MD     VITAL SIGNS:  Blood pressure 138/82, pulse 67, temperature (!) 97.5 F (36.4 C), temperature source Oral, resp. rate 12, height 5\' 10"  (1.778 m), weight 129.1 kg, SpO2 100 %.  PHYSICAL EXAMINATION:  Physical Exam  GENERAL:  82 y.o.-year-old patient lying in the bed with no acute distress.  Obese EYES: Pupils equal, round, reactive to light and accommodation. No scleral icterus. Extraocular muscles intact.  HEENT: Head atraumatic, normocephalic. Oropharynx and nasopharynx clear. No oropharyngeal erythema, moist oral mucosa  NECK:  Supple, no jugular venous distention. No thyroid enlargement, no tenderness.  LUNGS: Normal breath sounds bilaterally, no wheezing, rales, rhonchi. No use of accessory muscles of respiration.  CARDIOVASCULAR: S1, S2 normal. No murmurs, rubs, or gallops.  ABDOMEN: Soft, nontender,  nondistended. Bowel sounds present. No organomegaly or mass.  EXTREMITIES: No pedal edema, cyanosis, or clubbing. + 2 pedal & radial pulses b/l.   NEUROLOGIC: Cranial nerves II through XII are intact. No focal Motor or sensory deficits appreciated b/l PSYCHIATRIC: The patient is alert and oriented x 3. Good affect.  SKIN: No obvious rash, lesion, or ulcer.  Left knee sutures are clean  LABORATORY PANEL:   CBC Recent Labs  Lab 06/21/18 0602 06/21/18 1311  WBC 8.8  --   HGB 11.9*  --   HCT 37.1*  --   PLT 226 215   ------------------------------------------------------------------------------------------------------------------  Chemistries  Recent Labs  Lab 06/21/18 0602  NA 143  K 3.9  CL 105  CO2 31  GLUCOSE 164*  BUN 39*  CREATININE 1.30*  CALCIUM 8.9  AST 31  ALT 24  ALKPHOS 97  BILITOT 0.7   ------------------------------------------------------------------------------------------------------------------  Cardiac Enzymes Recent Labs  Lab 06/21/18 0602  TROPONINI 0.11*   ------------------------------------------------------------------------------------------------------------------  RADIOLOGY:  No results found.   IMPRESSION AND PLAN:   *ST elevation MI.  Status post 2 drug-eluting stents to RCA On aspirin and Brilinta. Aggrastat Lipitor Appreciate cardiology help  *Hypertension.  Continue home medications.  *Recent left knee arthroplasty Discharge back to skilled nursing facility when ready   All the records are reviewed and case discussed with ED provider. Management plans discussed with the patient, family and they are in agreement.  CODE STATUS: Partial code  TOTAL TIME TAKING CARE OF THIS PATIENT: 35 minutes.   Dylan [name] M.D on 06/21/2018 at 2:33 PM  Between 7am to 6pm - Pager - [phone]  After 6pm go to [email or web address] - password [email or web address] [facility]  [facility] [employer] [facility]  Office  [phone]  CC: Primary care  physician; [name], MD  Note: This dictation was prepared with Dragon dictation along with smaller phrase technology. Any transcriptional errors that result from this process are unintentional.

## 2018-06-21 NOTE — Consult Note (Signed)
Dylan [name] is a 82 y.o. male  [id number]  Primary Cardiologist: Natasha Paulson Reason for Consultation: STEMI  HPI: [id number] s/p knee replacement surgery 06/16/18,  came with STEMI . Dr Kirke Corin did PCI/DES of dital RCA.   Review of Systems: chest ppain   Past Medical History:  Diagnosis Date  . CAD (coronary artery disease)   . Cervicalgia   . CHF (congestive heart failure) (HCC)   . COPD (chronic obstructive pulmonary disease) (HCC)   . Diastolic heart failure (HCC)   . Foot drop, right   . History of kidney stones   . Hyperlipidemia    unspecified  . Hypertension   . Myocardial infarction (HCC)   . Osteoarthritis   . Shoulder pain, left   . Sleep apnea   . Tremor, essential     Medications Prior to Admission  Medication Sig Dispense Refill  . acetaminophen (TYLENOL) 500 MG tablet Take 1,000 mg by mouth 2 (two) times daily as needed for moderate pain.    . Ascorbic Acid (VITAMIN C PO) Take 1 tablet by mouth daily.    Marland Kitchen atorvastatin (LIPITOR) 40 MG tablet Take 40 mg by mouth every evening.    . carvedilol (COREG) 12.5 MG tablet Take 12.5 mg by mouth 2 (two) times daily.     Marland Kitchen docusate sodium (COLACE) 100 MG capsule Take 300 mg by mouth at bedtime.     . enoxaparin (LOVENOX) 30 MG/0.3ML injection Inject 0.3 mLs (30 mg total) into the skin every 12 (twelve) hours for 14 days. 28 Syringe 0  . gabapentin (NEURONTIN) 400 MG capsule Take 400 mg by mouth at bedtime.    Marland Kitchen guaiFENesin (MUCUS-ER PO) Take 1 tablet by mouth every 12 (twelve) hours as needed.    . hydrochlorothiazide (HYDRODIURIL) 25 MG tablet Take 25 mg by mouth daily.     Marland Kitchen ipratropium-albuterol (DUONEB) 0.5-2.5 (3) MG/3ML SOLN Take 3 mLs by nebulization every 6 (six) hours as needed (shortness of breath).     . isosorbide mononitrate (IMDUR) 30 MG 24 hr tablet Take 30 mg by mouth daily.     Marland Kitchen ketotifen (ZADITOR) 0.025 % ophthalmic solution Place 1 drop into both eyes 2 (two) times daily. 5 mL 0  . lactose free  nutrition (BOOST) LIQD Take 237 mLs by mouth 3 (three) times a week.     Marland Kitchen lisinopril (PRINIVIL,ZESTRIL) 5 MG tablet Take 5 mg by mouth daily.     Marland Kitchen loratadine (CLARITIN) 10 MG tablet Take 10 mg by mouth daily as needed for allergies.    . Multiple Vitamins-Minerals (CENTROVITE) TABS Take 1 tablet by mouth daily.    . nitroGLYCERIN (NITROSTAT) 0.3 MG SL tablet Place 0.3 mg under the tongue every 5 (five) minutes as needed for chest pain.    Marland Kitchen oxyCODONE (OXY IR/ROXICODONE) 5 MG immediate release tablet Take 1 tablet (5 mg total) by mouth every 4 (four) hours as needed for moderate pain (pain score 4-6). 30 tablet 0  . potassium chloride (K-DUR,KLOR-CON) 10 MEQ tablet Take 10 mEq by mouth daily. May take an additional 10 meq dose as needed for swelling    . POTASSIUM GLUCONATE PO Take 1 capsule by mouth daily.    . primidone (MYSOLINE) 50 MG tablet Take 100 mg by mouth 2 (two) times daily.     Marland Kitchen torsemide (DEMADEX) 20 MG tablet Take 20 mg by mouth daily.     . traMADol (ULTRAM) 50 MG tablet Take 1-2 tablets (50-100 mg  total) by mouth every 4 (four) hours as needed for moderate pain. 60 tablet 0  . umeclidinium-vilanterol (ANORO ELLIPTA) 62.5-25 MCG/INH AEPB Inhale 1 puff into the lungs daily. (Patient taking differently: Inhale 1 puff into the lungs daily as needed (shortness of breath). )       . aspirin  81 mg Oral Daily  . atorvastatin  40 mg Oral QPM  . carvedilol  6.25 mg Oral BID WC  . docusate sodium  300 mg Oral QHS  . [START ON 06/22/2018] enoxaparin (LOVENOX) injection  40 mg Subcutaneous Q24H  . gabapentin  400 mg Oral QHS  . hydrochlorothiazide  25 mg Oral Daily  . isosorbide mononitrate  30 mg Oral Daily  . ketotifen  1 drop Both Eyes BID  . [START ON 06/22/2018] lactose free nutrition  237 mL Oral Once per day on Mon Wed Fri  . lisinopril  5 mg Oral Daily  . multivitamin with minerals  1 tablet Oral Daily  . potassium chloride  10 mEq Oral Daily  . primidone  100 mg Oral BID   . sodium chloride flush  3 mL Intravenous Q12H  . ticagrelor  90 mg Oral BID  . tirofiban  25 mcg/kg Intravenous Once    Infusions: . sodium chloride    . sodium chloride    . tirofiban      No Known Allergies  Social History   Socioeconomic History  . Marital status: Married    Spouse name: [name]  . Number of children: 2  . Years of education: 41  . Highest education level: 11th grade  Occupational History  . Occupation: retired  Engineer, production  . Financial resource strain: Not on file  . Food insecurity:    Worry: Not on file    Inability: Not on file  . Transportation needs:    Medical: Not on file    Non-medical: Not on file  Tobacco Use  . Smoking status: Former Smoker    Packs/day: 0.25    Years: 58.00    Pack years: 14.50    Types: Cigarettes  . Smokeless tobacco: Former Neurosurgeon  . Tobacco comment: quit the begginning of septembet 2019  Substance and Sexual Activity  . Alcohol use: No    Alcohol/week: 0.0 standard drinks  . Drug use: No  . Sexual activity: Not on file  Lifestyle  . Physical activity:    Days per week: Not on file    Minutes per session: Not on file  . Stress: Not on file  Relationships  . Social connections:    Talks on phone: Not on file    Gets together: Not on file    Attends religious service: Not on file    Active member of club or organization: Not on file    Attends meetings of clubs or organizations: Not on file    Relationship status: Not on file  . Intimate partner violence:    Fear of current or ex partner: Not on file    Emotionally abused: Not on file    Physically abused: Not on file    Forced sexual activity: Not on file  Other Topics Concern  . Not on file  Social History Narrative   Full Code with HCPOA and living will   Married with 2 children   Former smoker   Alcohol - none   Smokeless tobacco - none    Family History  Problem Relation Age of Onset  . Diabetes Son   .  Cancer Mother   . Colon cancer  Mother   . Lung cancer Mother   . Tremor Father   . Heart disease Father   . Tremor Brother   . Bladder Cancer Brother   . Tremor Sister     PHYSICAL EXAM: Vitals:   06/21/18 0653 06/21/18 0826  BP:  122/75  Pulse:  66  Resp:  12  Temp:  (!) 97.5 F (36.4 C)  SpO2: 98% 98%    No intake or output data in the 24 hours ending 06/21/18 1028  General:  Well appearing. No respiratory difficulty HEENT: normal Neck: supple. no JVD. Carotids 2+ bilat; no bruits. No lymphadenopathy or thryomegaly appreciated. Cor: PMI nondisplaced. Regular rate & rhythm. No rubs, gallops or murmurs. Lungs: clear Abdomen: soft, nontender, nondistended. No hepatosplenomegaly. No bruits or masses. Good bowel sounds. Extremities: no cyanosis, clubbing, rash, edema Neuro: alert & oriented x 3, cranial nerves grossly intact. moves all 4 extremities w/o difficulty. Affect pleasant.  ECG: NSR 62/min st elevation inferior leads  Results for orders placed or performed during the hospital encounter of 06/21/18 (from the past 24 hour(s))  Troponin I     Status: Abnormal   Collection Time: 06/21/18  6:02 AM  Result Value Ref Range   Troponin I 0.11 (HH) <0.03 ng/mL  Comprehensive metabolic panel     Status: Abnormal   Collection Time: 06/21/18  6:02 AM  Result Value Ref Range   Sodium 143 135 - 145 mmol/L   Potassium 3.9 3.5 - 5.1 mmol/L   Chloride 105 98 - 111 mmol/L   CO2 31 22 - 32 mmol/L   Glucose, Bld 164 (H) 70 - 99 mg/dL   BUN 39 (H) 8 - 23 mg/dL   Creatinine, Ser 1.61 (H) 0.61 - 1.24 mg/dL   Calcium 8.9 8.9 - 09.6 mg/dL   Total Protein 6.7 6.5 - 8.1 g/dL   Albumin 3.3 (L) 3.5 - 5.0 g/dL   AST 31 15 - 41 U/L   ALT 24 0 - 44 U/L   Alkaline Phosphatase 97 38 - 126 U/L   Total Bilirubin 0.7 0.3 - 1.2 mg/dL   GFR calc non Af Amer 50 (L) >60 mL/min   GFR calc Af Amer 58 (L) >60 mL/min   Anion gap 7 5 - 15  CBC with Differential     Status: Abnormal   Collection Time: 06/21/18  6:02 AM  Result  Value Ref Range   WBC 8.8 4.0 - 10.5 K/uL   RBC 3.71 (L) 4.22 - 5.81 MIL/uL   Hemoglobin 11.9 (L) 13.0 - 17.0 g/dL   HCT 04.5 (L) 40.9 - 81.1 %   MCV 100.0 80.0 - 100.0 fL   MCH 32.1 26.0 - 34.0 pg   MCHC 32.1 30.0 - 36.0 g/dL   RDW 91.4 (H) 78.2 - 95.6 %   Platelets 226 150 - 400 K/uL   nRBC 0.0 0.0 - 0.2 %   Neutrophils Relative % 62 %   Neutro Abs 5.6 1.7 - 7.7 K/uL   Lymphocytes Relative 22 %   Lymphs Abs 1.9 0.7 - 4.0 K/uL   Monocytes Relative 10 %   Monocytes Absolute 0.8 0.1 - 1.0 K/uL   Eosinophils Relative 4 %   Eosinophils Absolute 0.3 0.0 - 0.5 K/uL   Basophils Relative 1 %   Basophils Absolute 0.1 0.0 - 0.1 K/uL   Immature Granulocytes 1 %   Abs Immature Granulocytes 0.08 (H) 0.00 - 0.07 K/uL  APTT     Status: Abnormal   Collection Time: 06/21/18  6:02 AM  Result Value Ref Range   aPTT 39 (H) 24 - 36 seconds  Brain natriuretic peptide     Status: Abnormal   Collection Time: 06/21/18  6:05 AM  Result Value Ref Range   B Natriuretic Peptide 684.0 (H) 0.0 - 100.0 pg/mL  POCT Activated clotting time     Status: None   Collection Time: 06/21/18  6:55 AM  Result Value Ref Range   Activated Clotting Time 296 seconds  POCT Activated clotting time     Status: None   Collection Time: 06/21/18  7:26 AM  Result Value Ref Range   Activated Clotting Time 274 seconds  MRSA PCR Screening     Status: Abnormal   Collection Time: 06/21/18  8:26 AM  Result Value Ref Range   MRSA by PCR POSITIVE (A) NEGATIVE  Glucose, capillary     Status: Abnormal   Collection Time: 06/21/18  8:28 AM  Result Value Ref Range   Glucose-Capillary 141 (H) 70 - 99 mg/dL   No results found.   ASSESSMENT AND PLAN: S/P STEMI 5 days after knee replacement. H/o STEMI in same vessel RCA at [facility] 6/ 8/17. Patient was seen on 02/25/18 and was asymptomatic but stress test had infarction already in RCA territory due to prior STEMI. LVEF was 35-40%. Will get echo.  Mahoganie Basher A

## 2018-06-21 NOTE — ED Notes (Signed)
Dr Samuella Cota to bedside.

## 2018-06-22 ENCOUNTER — Other Ambulatory Visit: Payer: Self-pay

## 2018-06-22 ENCOUNTER — Encounter: Payer: Self-pay | Admitting: Cardiovascular Disease

## 2018-06-22 LAB — BASIC METABOLIC PANEL
Anion gap: 10 (ref 5–15)
BUN: 37 mg/dL — AB (ref 8–23)
CO2: 28 mmol/L (ref 22–32)
CREATININE: 1.4 mg/dL — AB (ref 0.61–1.24)
Calcium: 8.4 mg/dL — ABNORMAL LOW (ref 8.9–10.3)
Chloride: 104 mmol/L (ref 98–111)
GFR calc Af Amer: 53 mL/min — ABNORMAL LOW (ref >60)
GFR, EST NON AFRICAN AMERICAN: 46 mL/min — AB (ref >60)
Glucose, Bld: 120 mg/dL — ABNORMAL HIGH (ref 70–99)
POTASSIUM: 3.5 mmol/L (ref 3.5–5.1)
Sodium: 142 mmol/L (ref 135–145)

## 2018-06-22 LAB — CBC
HCT: 33.2 % — ABNORMAL LOW (ref 39.0–52.0)
HEMOGLOBIN: 10.6 g/dL — AB (ref 13.0–17.0)
MCH: 32.4 pg (ref 26.0–34.0)
MCHC: 31.9 g/dL (ref 30.0–36.0)
MCV: 101.5 fL — ABNORMAL HIGH (ref 80.0–100.0)
Platelets: 220 10*3/uL (ref 150–400)
RBC: 3.27 MIL/uL — ABNORMAL LOW (ref 4.22–5.81)
RDW: 17.2 % — ABNORMAL HIGH (ref 11.5–15.5)
WBC: 8.1 10*3/uL (ref 4.0–10.5)
nRBC: 0 % (ref 0.0–0.2)

## 2018-06-22 MED ORDER — CHLORHEXIDINE GLUCONATE CLOTH 2 % EX PADS
6.0000 | MEDICATED_PAD | Freq: Every day | CUTANEOUS | Status: DC
  Administered 2018-06-23: 6 via TOPICAL

## 2018-06-22 MED ORDER — SACUBITRIL-VALSARTAN 24-26 MG PO TABS
1.0000 | ORAL_TABLET | Freq: Two times a day (BID) | ORAL | Status: DC
  Administered 2018-06-22 – 2018-06-24 (×4): 1 via ORAL
  Filled 2018-06-22 (×5): qty 1

## 2018-06-22 MED ORDER — ENOXAPARIN SODIUM 40 MG/0.4ML ~~LOC~~ SOLN
40.0000 mg | Freq: Two times a day (BID) | SUBCUTANEOUS | Status: DC
  Administered 2018-06-22 – 2018-06-24 (×4): 40 mg via SUBCUTANEOUS
  Filled 2018-06-22 (×4): qty 0.4

## 2018-06-22 MED ORDER — UMECLIDINIUM-VILANTEROL 62.5-25 MCG/INH IN AEPB
1.0000 | INHALATION_SPRAY | Freq: Every day | RESPIRATORY_TRACT | Status: DC
  Administered 2018-06-22 – 2018-06-24 (×3): 1 via RESPIRATORY_TRACT
  Filled 2018-06-22 (×2): qty 14

## 2018-06-22 MED ORDER — BISACODYL 5 MG PO TBEC
5.0000 mg | DELAYED_RELEASE_TABLET | Freq: Every day | ORAL | Status: DC | PRN
  Administered 2018-06-22 – 2018-06-24 (×2): 5 mg via ORAL
  Filled 2018-06-22 (×2): qty 1

## 2018-06-22 MED ORDER — MUPIROCIN 2 % EX OINT
1.0000 "application " | TOPICAL_OINTMENT | Freq: Two times a day (BID) | CUTANEOUS | Status: DC
  Administered 2018-06-22 – 2018-06-24 (×4): 1 via NASAL
  Filled 2018-06-22: qty 22

## 2018-06-22 MED ORDER — POTASSIUM CHLORIDE CRYS ER 20 MEQ PO TBCR
20.0000 meq | EXTENDED_RELEASE_TABLET | Freq: Once | ORAL | Status: AC
  Administered 2018-06-22: 20 meq via ORAL
  Filled 2018-06-22: qty 1

## 2018-06-22 NOTE — Evaluation (Signed)
Physical Therapy Evaluation Patient Details Name: Dylan [name] MRN: [id number] DOB: [birth year] Today's Date: 06/22/2018   History of Present Illness  Pt is a 82 y/o M from SNF due to recent L TKA (~3 wks PTA) who was found to have STEMI.  Pt was taken to cath lab and had 2 stents placed in RCA.  Pt's PMH includes cervicalgia, CHF, COPD, R foot drop, MI, L shoulder pain, tremor.      Clinical Impression  Pt admitted with above diagnosis. Pt currently with functional limitations due to the deficits listed below (see PT Problem List). Mr. Mccuiston demonstrates decline in mobility level compared to his time at [facility]. This is likely due to need for use of platform RW due to recent radial cardiac cath as well as time spent in bed due to recent STEMI.  Pt put forth good effort but ultimately required assist for all aspects of mobility, thus recommending return to SNF at d/c. Pt will benefit from skilled PT to increase their independence and safety with mobility to allow discharge to the venue listed below.      Follow Up Recommendations SNF    Equipment Recommendations  Other (comment)(TBD at next venue of care)    Recommendations for Other Services OT consult     Precautions / Restrictions Precautions Precautions: Fall Precaution Comments: Has R AFO (pt reports his AFO is at home); reviewed no pillow under knee(encouraged pt to have family member bring AFO to hospital. ) Knee Immobilizer - Left: (Discontinued used of KI once at [facility] Restrictions Weight Bearing Restrictions: Yes RUE Weight Bearing: Non weight bearing LLE Weight Bearing: Weight bearing as tolerated Other Position/Activity Restrictions: Pt s/p radial cariac cath on R on 10/20 therefore is NWB R wrist for 48 hrs.  Then pt is permitted to gradually increase WBing over the course of 1 wk.       Mobility  Bed Mobility Overal bed mobility: Needs Assistance Bed Mobility: Supine to Sit     Supine to sit: Min guard;HOB elevated      General bed mobility comments: Pt with heavy use of bed rail and increased time with cues for sequencing  Transfers Overall transfer level: Needs assistance Equipment used: (Platform bari RW) Transfers: Sit to/from [employer] Sit to Stand: Min assist;+2 physical assistance;From elevated surface;Mod assist Stand pivot transfers: Min assist;+2 physical assistance;+2 safety/equipment       General transfer comment: Bed heigh slightly elevated.  Pt requires min +2 assist to boost to standing and to remain steady.  Pt requires mod +2 assist to control descent to sit. Pt requires step by step cues when pivoting and requires assist with RW management.  Constant cues for upright posture.  Ambulation/Gait             General Gait Details: Not safe to attempt at this time  Stairs            Wheelchair Mobility    Modified Rankin (Stroke Patients Only)       Balance Overall balance assessment: Needs assistance Sitting-balance support: No upper extremity supported;Feet supported Sitting balance-Leahy Scale: Fair Sitting balance - Comments: Pt able to sit EOB without UE support but would likely lose his balance with perturbation   Standing balance support: Bilateral upper extremity supported;During functional activity Standing balance-Leahy Scale: Poor Standing balance comment: Pt relies on BUE support for static and dynamic activities  Pertinent Vitals/Pain Pain Assessment: No/denies pain Pain Intervention(s): Monitored during session    Home Living Family/patient expects to be discharged to:: Skilled nursing facility                 Additional Comments: Pt from SNF following L TKA    Prior Function Level of Independence: Needs assistance   Gait / Transfers Assistance Needed: Pt ambulating up to 30 ft with RW at SNF   ADL's / Homemaking Assistance Needed: Pt requires assist for dressing, bathing (pt  has been getting into the shower with assist)        Hand Dominance        Extremity/Trunk Assessment   Upper Extremity Assessment Upper Extremity Assessment: Defer to OT evaluation    Lower Extremity Assessment Lower Extremity Assessment: LLE deficits/detail;RLE deficits/detail RLE Deficits / Details: 0/5 DF (h/o drop foot); at least 3/5 AROM hip flexion and knee flexion/extension LLE Deficits / Details: Limited assessment due to recent L TKA.  Pt able to perform L LAQ and SLR without assist.     Cervical / Trunk Assessment Cervical / Trunk Assessment: Kyphotic  Communication   Communication: No difficulties  Cognition Arousal/Alertness: Awake/alert Behavior During Therapy: WFL for tasks assessed/performed Overall Cognitive Status: Within Functional Limits for tasks assessed                                        General Comments General comments (skin integrity, edema, etc.): VSS throughout session    Exercises Total Joint Exercises Ankle Circles/Pumps: AROM;Both;10 reps;Supine Quad Sets: Strengthening;Both;10 reps;Supine Short Arc Quad: Strengthening;Left;10 reps;Supine Hip ABduction/ADduction: Strengthening;Left;10 reps;Supine Straight Leg Raises: Strengthening;Left;10 reps;Supine Long Arc Quad: Strengthening;Left;10 reps;Seated Marching in Standing: AROM;Both;10 reps;Standing   Assessment/Plan    PT Assessment Patient needs continued PT services  PT Problem List Decreased strength;Decreased range of motion;Decreased activity tolerance;Decreased balance;Decreased knowledge of use of DME;Decreased safety awareness;Pain       PT Treatment Interventions DME instruction;Gait training;Stair training;Functional mobility training;Therapeutic activities;Therapeutic exercise;Balance training;Neuromuscular re-education;Patient/family education;Manual techniques;Modalities    PT Goals (Current goals can be found in the Care Plan section)  Acute Rehab PT  Goals Patient Stated Goal: to improve mobility PT Goal Formulation: With patient Time For Goal Achievement: 07/06/18 Potential to Achieve Goals: Good    Frequency 7X/week   Barriers to discharge Decreased caregiver support pt alone while wife works    Co-evaluation               AM-PAC PT "6 Clicks" Daily Activity  Outcome Measure Difficulty turning over in bed (including adjusting bedclothes, sheets and blankets)?: Unable Difficulty moving from lying on back to sitting on the side of the bed? : Unable Difficulty sitting down on and standing up from a chair with arms (e.g., wheelchair, bedside commode, etc,.)?: Unable Help needed moving to and from a bed to chair (including a wheelchair)?: A Lot Help needed walking in hospital room?: A Lot Help needed climbing 3-5 steps with a railing? : Total 6 Click Score: 8    End of Session Equipment Utilized During Treatment: Gait belt Activity Tolerance: Patient limited by fatigue Patient left: in chair;with call bell/phone within reach Nurse Communication: Mobility status;Weight bearing status;Precautions(pt aware of no chair alarm ) PT Visit Diagnosis: Other abnormalities of gait and mobility (R26.89);Unsteadiness on feet (R26.81);Muscle weakness (generalized) (M62.81);Difficulty in walking, not elsewhere classified (R26.2);Pain Pain - Right/Left: Left Pain - part  of body: Knee    Time: 0981-1914 PT Time Calculation (min) (ACUTE ONLY): 31 min   Charges:   PT Evaluation $PT Eval Moderate Complexity: 1 Mod PT Treatments $Therapeutic Exercise: 8-22 mins        Encarnacion Chu PT, DPT 06/22/2018, 1:34 PM

## 2018-06-22 NOTE — Progress Notes (Signed)
SUBJECTIVE: Feeling well today. No chest pain or shortness of breath however he reports that he had shortness of breath with Brilinta in the past so he wanted to switch to Plavix.   Vitals:   06/22/18 0400 06/22/18 0500 06/22/18 0600 06/22/18 0804  BP: 115/67 124/64 121/67 119/83  Pulse: (!) 55 (!) 56 (!) 59 (!) 58  Resp: (!) 2 (!) 21 (!) 5   Temp: 98.3 F (36.8 C)     TempSrc: Oral     SpO2: 100% 100% 100%   Weight:      Height:        Intake/Output Summary (Last 24 hours) at 06/22/2018 0855 Last data filed at 06/22/2018 0839 Gross per 24 hour  Intake 663.44 ml  Output 1075 ml  Net -411.56 ml    LABS: Basic Metabolic Panel: Recent Labs    06/21/18 0602 06/22/18 0600  NA 143 142  K 3.9 3.5  CL 105 104  CO2 31 28  GLUCOSE 164* 120*  BUN 39* 37*  CREATININE 1.30* 1.40*  CALCIUM 8.9 8.4*   Liver Function Tests: Recent Labs    06/21/18 0602  AST 31  ALT 24  ALKPHOS 97  BILITOT 0.7  PROT 6.7  ALBUMIN 3.3*   No results for input(s): LIPASE, AMYLASE in the last 72 hours. CBC: Recent Labs    06/21/18 0602 06/21/18 1311 06/22/18 0600  WBC 8.8  --  8.1  NEUTROABS 5.6  --   --   HGB 11.9*  --  10.6*  HCT 37.1*  --  33.2*  MCV 100.0  --  101.5*  PLT 226 215 220   Cardiac Enzymes: Recent Labs    06/21/18 0602  TROPONINI 0.11*   BNP: Invalid input(s): POCBNP D-Dimer: No results for input(s): DDIMER in the last 72 hours. Hemoglobin A1C: No results for input(s): HGBA1C in the last 72 hours. Fasting Lipid Panel: No results for input(s): CHOL, HDL, LDLCALC, TRIG, CHOLHDL, LDLDIRECT in the last 72 hours. Thyroid Function Tests: No results for input(s): TSH, T4TOTAL, T3FREE, THYROIDAB in the last 72 hours.  Invalid input(s): FREET3 Anemia Panel: No results for input(s): VITAMINB12, FOLATE, FERRITIN, TIBC, IRON, RETICCTPCT in the last 72 hours.   PHYSICAL EXAM General: Well developed, well nourished, in no acute distress HEENT:  Normocephalic and  atramatic Neck:  No JVD.  Lungs: Clear bilaterally to auscultation and percussion. Heart: HRRR . Normal S1 and S2 without gallops or murmurs.  Abdomen: Bowel sounds are positive, abdomen soft and non-tender  Msk:  Back normal, normal gait. Normal strength and tone for age. Extremities: Purple-blue discoloration of both shins, mild LE edema. No bleeding at right radial cath site. Neuro: Alert and oriented X 3. Psych:  Good affect, responds appropriately  TELEMETRY: Sinus bradycardia 58bpm  ASSESSMENT AND PLAN:  Acute ST elevation MI: status post aspiration thrombectomy and two overlapping stents placed in RCA. 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 Status post knee replacement 3 weeks ago. Plan to continue Brilinta while inpatient, if patient develops shortness of breath will need to switch to Plavix. Continue DAPT.  CHF with reduced ejection fraction: Echo Results: 4 chamber dilatation with severe LV systolic dysfunction apprxomatly 35% with diffuse hypokinesis.  Will replace lisinopril with Entresto due to severe LV dysfunction and plan to advance coreg as tolerated.    Active Problems:   Acute ST elevation myocardial infarction (STEMI) of inferior wall (HCC)    Caroleen Hamman, NP-C 06/22/2018 8:55 AM Cell: [phone]

## 2018-06-22 NOTE — Evaluation (Signed)
Occupational Therapy Evaluation Patient Details Name: Dylan [name] MRN: [id number] DOB: 1935-09-04 Today's Date: 06/22/2018    History of Present Illness Pt is a 82 y/o M from SNF due to recent L TKA (~3 wks PTA) who was found to have STEMI.  Pt was taken to cath lab and had 2 stents placed in RCA.  Pt's PMH includes cervicalgia, CHF, COPD, R foot drop, MI, L shoulder pain, tremor.     Clinical Impression   Pt seen for OT evaluation this date. Prior to hospital admission, pt was completing STR following a knee replacement ~3 wks prior. Since initiating therapy, pt reports ambulating with therapist approx 73ft w/ a RW (about the distance of his living room to the bathroom at home). Pt eager to continue therapy to help support his recovery. Currently pt demonstrates impairments in activity tolerance, strength, cardiopulmonary functional status, and balance requiring mod assist for LB ADL and assist with mobility.  Pt would benefit from skilled OT to address noted impairments and functional limitations (see below for any additional details) in order to maximize safety and independence while minimizing falls risk and caregiver burden.  Upon hospital discharge, recommend pt discharge back to STR to continue therapy focused on energy conservation strategies, improving independence/safety with ADL, and supporting safe transition home.    Follow Up Recommendations  SNF    Equipment Recommendations  Other (comment)(TBD)    Recommendations for Other Services       Precautions / Restrictions Precautions Precautions: Fall Precaution Comments: Has R AFO (pt reports his AFO is at home); reviewed no pillow under knee(encouraged pt to have family member bring AFO to hospital) Required Braces or Orthoses: Knee Immobilizer - Left Knee Immobilizer - Left: (Discontinued used of KI once at [facility]) Restrictions Weight Bearing Restrictions: Yes RUE Weight Bearing: Non weight bearing LLE Weight Bearing: Weight  bearing as tolerated Other Position/Activity Restrictions: Pt s/p radial cariac cath on R on 10/20 therefore is NWB R wrist for 48 hrs.  Then pt is permitted to gradually increase WBing over the course of 1 wk.       Mobility Bed Mobility               General bed mobility comments: deferred, up in recliner  Transfers                      Balance Overall balance assessment: Needs assistance Sitting-balance support: No upper extremity supported;Feet supported Sitting balance-Leahy Scale: Fair                                     ADL either performed or assessed with clinical judgement   ADL Overall ADL's : Needs assistance/impaired Eating/Feeding: Sitting;Set up Eating/Feeding Details (indicate cue type and reason): PRN set up when tremors are worse Grooming: Sitting;Set up   Upper Body Bathing: Sitting;Minimal assistance;Moderate assistance   Lower Body Bathing: Sitting/lateral leans;Moderate assistance   Upper Body Dressing : Sitting;Minimal assistance   Lower Body Dressing: Sit to/from stand;Moderate assistance;Maximal assistance                       Vision Baseline Vision/History: Wears glasses Wears Glasses: Reading only Patient Visual Report: No change from baseline       Perception     Praxis      Pertinent Vitals/Pain Pain Assessment: No/denies pain Pain Intervention(s): Monitored during session  Hand Dominance Right   Extremity/Trunk Assessment Upper Extremity Assessment Upper Extremity Assessment: RUE deficits/detail;LUE deficits/detail RUE Deficits / Details: assessment deferred 2/2 radial cath precautionsp; hx impaired shoulder ROM 3-/5, hx of hand tremors LUE Deficits / Details: hx impaired shoulder ROM 2+/5, all else grossly WFL, hx of hand tremors   Lower Extremity Assessment Lower Extremity Assessment: Defer to PT evaluation;RLE deficits/detail;LLE deficits/detail RLE Deficits / Details: 0/5 DF (h/o  drop foot); at least 3/5 AROM hip flexion and knee flexion/extension LLE Deficits / Details: Limited assessment due to recent L TKA.  Pt able to perform L LAQ and SLR without assist.    Cervical / Trunk Assessment Cervical / Trunk Assessment: Kyphotic   Communication Communication Communication: No difficulties   Cognition Arousal/Alertness: Awake/alert Behavior During Therapy: WFL for tasks assessed/performed Overall Cognitive Status: Within Functional Limits for tasks assessed                                     General Comments  HR 49-51 t/o session at rest    Exercises     Shoulder Instructions      Home Living Family/patient expects to be discharged to:: Skilled nursing facility                                 Additional Comments: Pt from SNF following L TKA      Prior Functioning/Environment Level of Independence: Needs assistance  Gait / Transfers Assistance Needed: Pt ambulating up to 30 ft with RW at SNF  ADL's / Homemaking Assistance Needed: Pt requires assist for dressing, bathing (pt has been getting into the shower with assist)            OT Problem List: Decreased strength;Decreased knowledge of use of DME or AE;Decreased coordination;Obesity;Decreased activity tolerance;Impaired UE functional use;Impaired balance (sitting and/or standing);Cardiopulmonary status limiting activity      OT Treatment/Interventions: Self-care/ADL training;Balance training;Therapeutic exercise;Therapeutic activities;Energy conservation;DME and/or AE instruction;Patient/family education    OT Goals(Current goals can be found in the care plan section) Acute Rehab OT Goals Patient Stated Goal: to keep getting stronger and independent OT Goal Formulation: With patient/family Time For Goal Achievement: 07/06/18 Potential to Achieve Goals: Good ADL Goals Pt Will Perform Lower Body Dressing: sit to/from stand;with adaptive equipment;with min assist Pt  Will Transfer to Toilet: with min guard assist;ambulating(LRAD for amb, comfort height toilet) Pt Will Perform Tub/Shower Transfer: with min assist;ambulating(LRAD/DME for seated shower) Additional ADL Goal #1: Pt will utilize at least 2 learned energy conservation strategies to maximize safety and independence with functional mobility and ADL tasks in order to minimize falls risk and risk of overexertion.  OT Frequency: Min 1X/week   Barriers to D/C:            Co-evaluation              AM-PAC PT "6 Clicks" Daily Activity     Outcome Measure Help from another person eating meals?: None Help from another person taking care of personal grooming?: None Help from another person toileting, which includes using toliet, bedpan, or urinal?: A Lot Help from another person bathing (including washing, rinsing, drying)?: A Lot Help from another person to put on and taking off regular upper body clothing?: A Little Help from another person to put on and taking off regular lower body clothing?: A Lot 6  Click Score: 17   End of Session    Activity Tolerance: Patient tolerated treatment well Patient left: in chair;with call bell/phone within reach;with family/visitor present  OT Visit Diagnosis: Other abnormalities of gait and mobility (R26.89);Repeated falls (R29.6);Muscle weakness (generalized) (M62.81)                Time: 4098-1191 OT Time Calculation (min): 14 min Charges:  OT General Charges $OT Visit: 1 Visit OT Evaluation $OT Eval Low Complexity: 1 Low  Richrd Prime, MPH, MS, OTR/L ascom [phone] 06/22/18, 2:59 PM

## 2018-06-22 NOTE — Progress Notes (Addendum)
BMI > 40, changed lovenox to 40 mg q12h  Mauri Reading, PharmD Pharmacy Resident  06/22/2018 8:29 AM

## 2018-06-22 NOTE — Progress Notes (Signed)
[facility] [facility] [place] [facility]   PATIENT NAME: Dylan [name]    MR#:  [id number]  DATE OF BIRTH:  [birth year]  SUBJECTIVE:  CHIEF COMPLAINT:   Chief Complaint  Patient presents with  . Code STEMI  Patient seen and evaluated today No new episodes of chest pain Tolerating diet well Status post cardiac cath and stent placement No complaints of any shortness of breath  REVIEW OF SYSTEMS:    ROS  CONSTITUTIONAL: No documented fever. No fatigue, weakness. No weight gain, no weight loss.  EYES: No blurry or double vision.  ENT: No tinnitus. No postnasal drip. No redness of the oropharynx.  RESPIRATORY: No cough, no wheeze, no hemoptysis. No dyspnea.  CARDIOVASCULAR: No chest pain. No orthopnea. No palpitations. No syncope.  GASTROINTESTINAL: No nausea, no vomiting or diarrhea. No abdominal pain. No melena or hematochezia.  GENITOURINARY: No dysuria or hematuria.  ENDOCRINE: No polyuria or nocturia. No heat or cold intolerance.  HEMATOLOGY: No anemia. No bruising. No bleeding.  INTEGUMENTARY: No rashes. No lesions.  MUSCULOSKELETAL: No arthritis. No swelling. No gout.  NEUROLOGIC: No numbness, tingling, or ataxia. No seizure-type activity.  PSYCHIATRIC: No anxiety. No insomnia. No ADD.   DRUG ALLERGIES:  No Known Allergies  VITALS:  Blood pressure 119/83, pulse (!) 58, temperature 98.3 F (36.8 C), temperature source Oral, resp. rate (!) 5, height 5\' 10"  (1.778 m), weight 129.1 kg, SpO2 100 %.  PHYSICAL EXAMINATION:   Physical Exam  GENERAL:  82 y.o.-year-old patient lying in the bed with no acute distress.  EYES: Pupils equal, round, reactive to light and accommodation. No scleral icterus. Extraocular muscles intact.  HEENT: Head atraumatic, normocephalic. Oropharynx and nasopharynx clear.  NECK:  Supple, no jugular venous distention. No thyroid enlargement, no tenderness.  LUNGS: Normal breath sounds bilaterally, no wheezing, rales, rhonchi. No use of  accessory muscles of respiration.  CARDIOVASCULAR: S1, S2 normal. No murmurs, rubs, or gallops.  ABDOMEN: Soft, nontender, nondistended. Bowel sounds present. No organomegaly or mass.  EXTREMITIES: No cyanosis, clubbing or edema b/l.    NEUROLOGIC: Cranial nerves II through XII are intact. No focal Motor or sensory deficits b/l.   PSYCHIATRIC: The patient is alert and oriented x 3.  SKIN: No obvious rash, lesion, or ulcer.   LABORATORY PANEL:   CBC Recent Labs  Lab 06/22/18 0600  WBC 8.1  HGB 10.6*  HCT 33.2*  PLT 220   ------------------------------------------------------------------------------------------------------------------ Chemistries  Recent Labs  Lab 06/21/18 0602 06/22/18 0600  NA 143 142  K 3.9 3.5  CL 105 104  CO2 31 28  GLUCOSE 164* 120*  BUN 39* 37*  CREATININE 1.30* 1.40*  CALCIUM 8.9 8.4*  AST 31  --   ALT 24  --   ALKPHOS 97  --   BILITOT 0.7  --    ------------------------------------------------------------------------------------------------------------------  Cardiac Enzymes Recent Labs  Lab 06/21/18 0602  TROPONINI 0.11*   ------------------------------------------------------------------------------------------------------------------  RADIOLOGY:  No results found.   ASSESSMENT AND PLAN:   82 year old male patient with history of coronary artery disease, congestive heart failure, hypertension, recent knee replacement [facility] service for MI  -STEMI Status post cardiac cath and placement of two drug-eluting stents in the RCA Continue aspirin, Brilinta and high intensity statin Cardiology follow-up appreciated  -Hypokalemia Replace potassium orally  -Recent history of left knee arthroplasty Disposition to SNF at the time of discharge  -DVT prophylaxis subcu Lovenox daily  All the records are reviewed  and case discussed with Care Management/Social Worker. Management plans  discussed with the patient, family and they are in agreement.  CODE STATUS: Partial code  DVT Prophylaxis: SCDs  TOTAL TIME TAKING CARE OF THIS PATIENT: 31 minutes.   POSSIBLE D/C IN 1 to 2 DAYS, DEPENDING ON CLINICAL CONDITION.  Ihor Austin M.D on 06/22/2018 at 11:03 AM  Between 7am to 6pm - Pager - [phone]  After 6pm go to [email or web address] - password [email or web address] [facility]  [facility] [place] [facility]  Office  [phone]  CC: Primary care physician; [name], MD  Note: This dictation was prepared with Dragon dictation along with smaller phrase technology. Any transcriptional errors that result from this process are unintentional.

## 2018-06-22 NOTE — Progress Notes (Signed)
Multiple old and new skin tears. Covered with mepitel and transparent cover.  Instructed patient to leave these on until they fell off on their own, or he could soak them off.  His skin is very fragile.

## 2018-06-22 NOTE — Progress Notes (Signed)
Report called to [facility].

## 2018-06-22 NOTE — Clinical Social Work Note (Signed)
CSW received consult that patient is from [facility] under short term rehab.  CSW to assess at a later time.  Ervin Knack. Hassan Rowan, MSW, Theresia Majors [phone]  06/22/2018 6:46 PM

## 2018-06-23 LAB — BASIC METABOLIC PANEL
Anion gap: 8 (ref 5–15)
BUN: 41 mg/dL — ABNORMAL HIGH (ref 8–23)
CALCIUM: 8.6 mg/dL — AB (ref 8.9–10.3)
CO2: 28 mmol/L (ref 22–32)
Chloride: 104 mmol/L (ref 98–111)
Creatinine, Ser: 1.43 mg/dL — ABNORMAL HIGH (ref 0.61–1.24)
GFR calc Af Amer: 52 mL/min — ABNORMAL LOW (ref >60)
GFR calc non Af Amer: 45 mL/min — ABNORMAL LOW (ref >60)
Glucose, Bld: 103 mg/dL — ABNORMAL HIGH (ref 70–99)
POTASSIUM: 3.3 mmol/L — AB (ref 3.5–5.1)
Sodium: 140 mmol/L (ref 135–145)

## 2018-06-23 MED ORDER — SPIRONOLACTONE 25 MG PO TABS
12.5000 mg | ORAL_TABLET | Freq: Every day | ORAL | Status: DC
  Administered 2018-06-23 – 2018-06-24 (×2): 12.5 mg via ORAL
  Filled 2018-06-23 (×2): qty 0.5
  Filled 2018-06-23 (×2): qty 1

## 2018-06-23 MED ORDER — CLOPIDOGREL BISULFATE 75 MG PO TABS
75.0000 mg | ORAL_TABLET | Freq: Every day | ORAL | Status: DC
  Administered 2018-06-23 – 2018-06-24 (×2): 75 mg via ORAL
  Filled 2018-06-23 (×2): qty 1

## 2018-06-23 MED ORDER — CLOPIDOGREL BISULFATE 75 MG PO TABS: 75.0000 mg | ORAL_TABLET | Freq: Every day | ORAL | Status: DC

## 2018-06-23 MED ORDER — DIPHENHYDRAMINE HCL 25 MG PO CAPS
25.0000 mg | ORAL_CAPSULE | Freq: Four times a day (QID) | ORAL | Status: DC | PRN
  Administered 2018-06-23 – 2018-06-24 (×2): 25 mg via ORAL
  Filled 2018-06-23 (×2): qty 1

## 2018-06-23 MED ORDER — POTASSIUM CHLORIDE CRYS ER 20 MEQ PO TBCR
40.0000 meq | EXTENDED_RELEASE_TABLET | Freq: Once | ORAL | Status: AC
  Administered 2018-06-23: 40 meq via ORAL
  Filled 2018-06-23: qty 2

## 2018-06-23 NOTE — Plan of Care (Signed)

## 2018-06-23 NOTE — Plan of Care (Signed)
  Problem: Coping: Goal: Level of anxiety will decrease Outcome: Progressing   Problem: Pain Managment: Goal: General experience of comfort will improve Outcome: Progressing   Problem: Safety: Goal: Ability to remain free from injury will improve Outcome: Progressing   Problem: Activity: Goal: Ability to return to baseline activity level will improve Outcome: Progressing   Problem: Cardiovascular: Goal: Ability to achieve and maintain adequate cardiovascular perfusion will improve Outcome: Progressing Goal: Vascular access site(s) Level 0-1 will be maintained Outcome: Progressing

## 2018-06-23 NOTE — Progress Notes (Signed)
Pt home Cpap unit preliminary check looks good. [facility] has been called for further assessment.

## 2018-06-23 NOTE — Progress Notes (Signed)
Physical Therapy Treatment Patient Details Name: Dylan [name] MRN: [id number] DOB: [birth year] Today's Date: 06/23/2018    History of Present Illness Pt is a 82 y/o M from SNF due to recent L TKA (~3 wks PTA) who was found to have STEMI.  Pt was taken to cath lab and had 2 stents placed in RCA.  Pt's PMH includes cervicalgia, CHF, COPD, R foot drop, MI, L shoulder pain, tremor.      PT Comments    Participated in exercises as described below.  PT able to SLR x 10.  KI not in room and he stated he was not using it at rehab prior to admit.  AFO has not been brought in at this time.  Transitioned to edge of bed with min assist.  Able to sit without support.  He was able to ambulate 12' almost to door with right platform walker.  He leans heavy on walker and requires +2 assist for gait with wc follow for safety.  Gait was limited by Clinical research associate for pt and staff safety.  He was motivated to walk again but required increased assist from chair to stand (lower surface) and after taking 2 steps he was unsafe to continue.  Cues to limit WB RUE from cardiac cath procedure (+48 hours ago) with orders to gradually increase over 1 week.   Follow Up Recommendations  SNF     Equipment Recommendations       Recommendations for Other Services       Precautions / Restrictions Precautions Precautions: Fall Precaution Comments: Has R AFO (pt reports his AFO is at home); reviewed no pillow under knee Restrictions Weight Bearing Restrictions: Yes RUE Weight Bearing: Non weight bearing LLE Weight Bearing: Weight bearing as tolerated Other Position/Activity Restrictions: Pt s/p radial cariac cath on R on 10/20 therefore is NWB R wrist for 48 hrs.  Then pt is permitted to gradually increase WBing over the course of 1 wk.       Mobility  Bed Mobility Overal bed mobility: Needs Assistance Bed Mobility: Supine to Sit     Supine to sit: Min assist        Transfers Overall transfer level: Needs  assistance Equipment used: Right platform walker Transfers: Sit to/from Stand Sit to Stand: Mod assist;+2 physical assistance            Ambulation/Gait Ambulation/Gait assistance: Min assist;+2 physical assistance Gait Distance (Feet): 10 Feet Assistive device: Right platform walker;Rolling walker (2 wheeled) Gait Pattern/deviations: Step-to pattern Gait velocity: decreased   General Gait Details: Generally unsteady with forward lean on walker, mod vc's to correct with little results.  Difficuly advancing RLE as he fatigued.  AFO not brought in at this time.   Stairs             Wheelchair Mobility    Modified Rankin (Stroke Patients Only)       Balance Overall balance assessment: Needs assistance Sitting-balance support: No upper extremity supported;Feet supported Sitting balance-Leahy Scale: Fair     Standing balance support: Bilateral upper extremity supported;During functional activity Standing balance-Leahy Scale: Poor Standing balance comment: Pt relies on BUE support for static and dynamic activities                            Cognition Arousal/Alertness: Awake/alert Behavior During Therapy: WFL for tasks assessed/performed Overall Cognitive Status: Within Functional Limits for tasks assessed  Exercises Total Joint Exercises Ankle Circles/Pumps: AROM;Both;10 reps;Supine Quad Sets: Strengthening;Both;10 reps;Supine Heel Slides: AAROM;Strengthening;Left;10 reps;Supine Hip ABduction/ADduction: Strengthening;Left;10 reps;Supine Straight Leg Raises: Strengthening;Left;10 reps;Supine    General Comments        Pertinent Vitals/Pain Pain Assessment: 0-10 Pain Score: 4  Pain Location: L knee Pain Descriptors / Indicators: Sore Pain Intervention(s): Limited activity within patient's tolerance;Monitored during session    Home Living                      Prior Function             PT Goals (current goals can now be found in the care plan section) Progress towards PT goals: Progressing toward goals    Frequency    7X/week      PT Plan Current plan remains appropriate    Co-evaluation              AM-PAC PT "6 Clicks" Daily Activity  Outcome Measure  Difficulty turning over in bed (including adjusting bedclothes, sheets and blankets)?: Unable Difficulty moving from lying on back to sitting on the side of the bed? : Unable   Help needed moving to and from a bed to chair (including a wheelchair)?: A Lot Help needed walking in hospital room?: A Lot Help needed climbing 3-5 steps with a railing? : Total 6 Click Score: 7    End of Session Equipment Utilized During Treatment: Gait belt Activity Tolerance: Patient limited by fatigue Patient left: in chair;with call bell/phone within reach   Pain - Right/Left: Left Pain - part of body: Knee     Time: 1130-1153 PT Time Calculation (min) (ACUTE ONLY): 23 min  Charges:  $Gait Training: 8-22 mins $Therapeutic Exercise: 8-22 mins                     Danielle Dess, PTA 06/23/18, 12:22 PM

## 2018-06-23 NOTE — Progress Notes (Signed)
SUBJECTIVE: Patient denies any chest pain or shortness of breath   Vitals:   06/22/18 2040 06/23/18 0529 06/23/18 0718 06/23/18 1007  BP: 137/65 125/64 117/68 (!) 122/56  Pulse: 66 (!) 56 (!) 57 (!) 59  Resp:  20 18   Temp:  97.8 F (36.6 C) 98.1 F (36.7 C)   TempSrc:  Oral Oral   SpO2:  99% 91%   Weight:      Height:        Intake/Output Summary (Last 24 hours) at 06/23/2018 1450 Last data filed at 06/23/2018 1335 Gross per 24 hour  Intake -  Output 750 ml  Net -750 ml    LABS: Basic Metabolic Panel: Recent Labs    06/22/18 0600 06/23/18 0337  NA 142 140  K 3.5 3.3*  CL 104 104  CO2 28 28  GLUCOSE 120* 103*  BUN 37* 41*  CREATININE 1.40* 1.43*  CALCIUM 8.4* 8.6*   Liver Function Tests: Recent Labs    06/21/18 0602  AST 31  ALT 24  ALKPHOS 97  BILITOT 0.7  PROT 6.7  ALBUMIN 3.3*   No results for input(s): LIPASE, AMYLASE in the last 72 hours. CBC: Recent Labs    06/21/18 0602 06/21/18 1311 06/22/18 0600  WBC 8.8  --  8.1  NEUTROABS 5.6  --   --   HGB 11.9*  --  10.6*  HCT 37.1*  --  33.2*  MCV 100.0  --  101.5*  PLT 226 215 220   Cardiac Enzymes: Recent Labs    06/21/18 0602  TROPONINI 0.11*   BNP: Invalid input(s): POCBNP D-Dimer: No results for input(s): DDIMER in the last 72 hours. Hemoglobin A1C: No results for input(s): HGBA1C in the last 72 hours. Fasting Lipid Panel: No results for input(s): CHOL, HDL, LDLCALC, TRIG, CHOLHDL, LDLDIRECT in the last 72 hours. Thyroid Function Tests: No results for input(s): TSH, T4TOTAL, T3FREE, THYROIDAB in the last 72 hours.  Invalid input(s): FREET3 Anemia Panel: No results for input(s): VITAMINB12, FOLATE, FERRITIN, TIBC, IRON, RETICCTPCT in the last 72 hours.   PHYSICAL EXAM General: Well developed, well nourished, in no acute distress HEENT:  Normocephalic and atramatic Neck:  No JVD.  Lungs: Clear bilaterally to auscultation and percussion. Heart: HRRR . Normal S1 and S2 without  gallops or murmurs.  Abdomen: Bowel sounds are positive, abdomen soft and non-tender  Msk:  Back normal, normal gait. Normal strength and tone for age. Extremities: No clubbing, cyanosis or edema.   Neuro: Alert and oriented X 3. Psych:  Good affect, responds appropriately  TELEMETRY: Sinus rhythm  ASSESSMENT AND PLAN: Status post STEMI with occlusion of the distal RCA.  Patient was complaining of shortness of breath with taking Brilinta and has been on the Plavix for 1 year after the last STEMI and will continue Plavix and aspirin uninterrupted from now on.  He also needs to be placed on spironolactone small dose 12.5 p.o. once a day.  Active Problems:   Acute ST elevation myocardial infarction (STEMI) of inferior wall (HCC)    Adrian Blackwater A, MD, [facility] 06/23/2018 2:50 PM

## 2018-06-23 NOTE — Clinical Social Work Note (Signed)
CSW presented bed offers to patient's wife [name] [phone], per patient's request.  Patient's wife would like him to return back to [facility] [place], CSW contacted [facility], they can accept patient and will begin insurance authorization.  CSW awaiting for insurance authorization and patient to be medically ready for discharge.  Formal assessment to follow, patient is in agreement to returning back to [facility].  Ervin Knack. Messina Kosinski, MSW, [facility] [phone]  06/23/2018 6:14 PM

## 2018-06-23 NOTE — Progress Notes (Signed)
[facility] - [place] at [facility]   PATIENT NAME: Dylan [name]    MR#:  [id number]  DATE OF BIRTH:  [birth year]  SUBJECTIVE:  CHIEF COMPLAINT:   Chief Complaint  Patient presents with  . Code STEMI  Patient seen and evaluated today No new episodes of chest pain Tolerating diet well Status post cardiac cath and stent placement Receiving physical therapy for recent knee surgery No complaints of any shortness of breath  REVIEW OF SYSTEMS:    ROS  CONSTITUTIONAL: No documented fever. No fatigue, weakness. No weight gain, no weight loss.  EYES: No blurry or double vision.  ENT: No tinnitus. No postnasal drip. No redness of the oropharynx.  RESPIRATORY: No cough, no wheeze, no hemoptysis. No dyspnea.  CARDIOVASCULAR: No chest pain. No orthopnea. No palpitations. No syncope.  GASTROINTESTINAL: No nausea, no vomiting or diarrhea. No abdominal pain. No melena or hematochezia.  GENITOURINARY: No dysuria or hematuria.  ENDOCRINE: No polyuria or nocturia. No heat or cold intolerance.  HEMATOLOGY: No anemia. No bruising. No bleeding.  INTEGUMENTARY: No rashes. No lesions.  MUSCULOSKELETAL: No arthritis. No swelling. No gout.  NEUROLOGIC: No numbness, tingling, or ataxia. No seizure-type activity.  PSYCHIATRIC: No anxiety. No insomnia. No ADD.   DRUG ALLERGIES:  No Known Allergies  VITALS:  Blood pressure (!) 111/53, pulse (!) 48, temperature 98.8 F (37.1 C), temperature source Oral, resp. rate 18, height 5\' 10"  (1.778 m), weight 133.1 kg, SpO2 100 %.  PHYSICAL EXAMINATION:   Physical Exam  GENERAL:  82 y.o.-year-old patient lying in the bed with no acute distress.  EYES: Pupils equal, round, reactive to light and accommodation. No scleral icterus. Extraocular muscles intact.  HEENT: Head atraumatic, normocephalic. Oropharynx and nasopharynx clear.  NECK:  Supple, no jugular venous distention. No thyroid enlargement, no tenderness.  LUNGS: Normal breath sounds  bilaterally, no wheezing, rales, rhonchi. No use of accessory muscles of respiration.  CARDIOVASCULAR: S1, S2 normal. No murmurs, rubs, or gallops.  ABDOMEN: Soft, nontender, nondistended. Bowel sounds present. No organomegaly or mass.  EXTREMITIES: No cyanosis, clubbing or edema b/l.    NEUROLOGIC: Cranial nerves II through XII are intact. No focal Motor or sensory deficits b/l.   PSYCHIATRIC: The patient is alert and oriented x 3.  SKIN: No obvious rash, lesion, or ulcer.   LABORATORY PANEL:   CBC Recent Labs  Lab 06/22/18 0600  WBC 8.1  HGB 10.6*  HCT 33.2*  PLT 220   ------------------------------------------------------------------------------------------------------------------ Chemistries  Recent Labs  Lab 06/21/18 0602  06/23/18 0337  NA 143   < > 140  K 3.9   < > 3.3*  CL 105   < > 104  CO2 31   < > 28  GLUCOSE 164*   < > 103*  BUN 39*   < > 41*  CREATININE 1.30*   < > 1.43*  CALCIUM 8.9   < > 8.6*  AST 31  --   --   ALT 24  --   --   ALKPHOS 97  --   --   BILITOT 0.7  --   --    < > = values in this interval not displayed.   ------------------------------------------------------------------------------------------------------------------  Cardiac Enzymes Recent Labs  Lab 06/21/18 0602  TROPONINI 0.11*   ------------------------------------------------------------------------------------------------------------------  RADIOLOGY:  No results found.   ASSESSMENT AND PLAN:   82 year old male patient with history of coronary artery disease, congestive heart failure, hypertension, recent knee replacement currently a resident of [facility] under hospitalist service for MI  -STEMI Status post cardiac cath and placement of two drug-eluting stents in the RCA by cardiology Continue aspirin, Plavix and high intensity statin Deferred Brilinta as per cardiology recommendation Cardiology follow-up appreciated Low-dose of Aldactone started as per  cardiology recommendation Continue betablocker  -Hypokalemia acute Replace potassium orally  -Recent history of left knee arthroplasty Disposition to SNF once bed is available Social worker follow-up  -DVT prophylaxis subcu Lovenox daily  All the records are reviewed and case discussed with Care Management/Social Worker. Management plans discussed with the patient, family and they are in agreement.  CODE STATUS: Partial code  DVT Prophylaxis: SCDs  TOTAL TIME TAKING CARE OF THIS PATIENT: 31 minutes.   POSSIBLE D/C IN 1 to 2 DAYS, DEPENDING ON CLINICAL CONDITION.  Ihor Austin M.D on 06/23/2018 at 3:38 PM  Between 7am to 6pm - Pager - [phone]  After 6pm go to [email or web address] - password [email or web address] [facility]  [facility]  Office  [phone]  CC: Primary care physician; [name], MD  Note: This dictation was prepared with Dragon dictation along with smaller phrase technology. Any transcriptional errors that result from this process are unintentional.

## 2018-06-23 NOTE — Care Management (Signed)
Pending insurance authorization for SNF. From [facility] would prefer [facility].  Minerva Areola, CSW following.

## 2018-06-24 LAB — CBC
HCT: 35 % — ABNORMAL LOW (ref 39.0–52.0)
Hemoglobin: 11.1 g/dL — ABNORMAL LOW (ref 13.0–17.0)
MCH: 31.6 pg (ref 26.0–34.0)
MCHC: 31.7 g/dL (ref 30.0–36.0)
MCV: 99.7 fL (ref 80.0–100.0)
NRBC: 0 % (ref 0.0–0.2)
PLATELETS: 208 10*3/uL (ref 150–400)
RBC: 3.51 MIL/uL — ABNORMAL LOW (ref 4.22–5.81)
RDW: 16.8 % — AB (ref 11.5–15.5)
WBC: 7.4 10*3/uL (ref 4.0–10.5)

## 2018-06-24 LAB — POTASSIUM: POTASSIUM: 3.8 mmol/L (ref 3.5–5.1)

## 2018-06-24 MED ORDER — SPIRONOLACTONE 25 MG PO TABS: 12.5000 mg | ORAL_TABLET | Freq: Every day | ORAL | 0 refills | Status: DC

## 2018-06-24 MED ORDER — TRAMADOL HCL 50 MG PO TABS: 50.0000 mg | ORAL_TABLET | Freq: Four times a day (QID) | ORAL | 0 refills | Status: AC | PRN

## 2018-06-24 MED ORDER — CLOPIDOGREL BISULFATE 75 MG PO TABS: 75.0000 mg | ORAL_TABLET | Freq: Every day | ORAL | 0 refills | Status: AC

## 2018-06-24 MED ORDER — ASPIRIN 81 MG PO CHEW: 81.0000 mg | CHEWABLE_TABLET | Freq: Every day | ORAL | 0 refills | Status: AC

## 2018-06-24 MED ORDER — SACUBITRIL-VALSARTAN 24-26 MG PO TABS: 1.0000 | ORAL_TABLET | Freq: Two times a day (BID) | ORAL | 0 refills | Status: AC

## 2018-06-24 MED ORDER — CARVEDILOL 6.25 MG PO TABS: 6.2500 mg | ORAL_TABLET | Freq: Two times a day (BID) | ORAL | 0 refills | Status: DC

## 2018-06-24 MED ORDER — ATORVASTATIN CALCIUM 80 MG PO TABS: 80.0000 mg | ORAL_TABLET | Freq: Every day | ORAL | 11 refills | Status: DC

## 2018-06-24 MED ORDER — OXYCODONE HCL 5 MG PO TABS: 5.0000 mg | ORAL_TABLET | ORAL | 0 refills | Status: AC | PRN

## 2018-06-24 NOTE — Plan of Care (Signed)
  Problem: Health Behavior/Discharge Planning: Goal: Ability to manage health-related needs will improve Outcome: Progressing Note:  Patient to hopefully d/c to [facility] today if authorization from insurance received. D/C order already entered. Will continue to monitor for an update. Dylan [name] [facility]

## 2018-06-24 NOTE — Care Management (Signed)
Order for CM for medication needs addressed with patient.  Brought Brilinta 30 day free coupon to patient.  He states he is not going to be discharged on Brilinta because of the side effects in the past.  States he will continue on plavix.  Checked current medication list and Brilinta is not on it.

## 2018-06-24 NOTE — Discharge Summary (Signed)
[facility] [place] [facility]   PATIENT NAME: Dylan [name]    MR#:  [id number]  DATE OF BIRTH:  [birth year]  DATE OF ADMISSION:  06/21/2018 ADMITTING PHYSICIAN: Arnaldo Natal, MD  DATE OF DISCHARGE: 06/24/2018  PRIMARY CARE PHYSICIAN: [name], MD   ADMISSION DIAGNOSIS:  ST elevation myocardial infarction (STEMI), unspecified artery (HCC) [I21.3] Acute ST elevation myocardial infarction (STEMI) of inferior wall (HCC) [I21.19]  DISCHARGE DIAGNOSIS:  Active Problems:   Acute ST elevation myocardial infarction (STEMI) of inferior wall (HCC) Coronary artery disease Chronic congestive heart failure diastolic dysfunction Hypertension History of recent knee replacement COPD  SECONDARY DIAGNOSIS:   Past Medical History:  Diagnosis Date  . CAD (coronary artery disease)   . Cervicalgia   . CHF (congestive heart failure) (HCC)   . COPD (chronic obstructive pulmonary disease) (HCC)   . Diastolic heart failure (HCC)   . Foot drop, right   . History of kidney stones   . Hyperlipidemia    unspecified  . Hypertension   . Myocardial infarction (HCC)   . Osteoarthritis   . Shoulder pain, left   . Sleep apnea   . Tremor, essential      ADMITTING HISTORY Ilian Wessell  is a 82 y.o. male with a known history of CAD, CHF, hypertension who recently had knee replacement and was in skilled nursing facility for rehab presented to the hospital with epigastric pain, nausea and bloating.  Was found to have ST elevation MI and cardiology was consulted.  Patient was taken to Cath Lab and had 2 stents placed in RCA. Patient is chest pain-free at this time.  Admitted to ICU.  HOSPITAL COURSE:  Patient was taken to Cath Lab from the emergency room and 2 stents were placed in the RCA by cardiology for ST elevation MI.  Patient was initially admitted to ICU.  Successfully cardiac stents were placed.  Patient was initially started on aspirin and Brilinta but later on  switched to aspirin and Plavix.  Continue with high intensity statin.  He was transferred to telemetry floor.  He received physical therapy and orthopedic service also followed up the patient.  Low-dose Aldactone was also added by cardiology and patient continued beta-blocker.  No new episodes of chest pain patient tolerated procedure well patient will be discharged [facility] for [facility].  CONSULTS OBTAINED:  Treatment Team:  Laurier Nancy, MD  DRUG ALLERGIES:  No Known Allergies  DISCHARGE MEDICATIONS:   Allergies as of 06/24/2018   No Known Allergies     Medication List    STOP taking these medications   enoxaparin 30 MG/0.3ML injection Commonly known as:  LOVENOX   hydrochlorothiazide 25 MG tablet Commonly known as:  HYDRODIURIL   POTASSIUM GLUCONATE PO     TAKE these medications   acetaminophen 500 MG tablet Commonly known as:  TYLENOL Take 1,000 mg by mouth 2 (two) times daily as needed for moderate pain.   aspirin 81 MG chewable tablet Chew 1 tablet (81 mg total) by mouth daily. Start taking on:  06/25/2018   atorvastatin 80 MG tablet Commonly known as:  LIPITOR Take 1 tablet (80 mg total) by mouth daily. What changed:    medication strength  how much to take  when to take this   carvedilol 6.25 MG tablet Commonly known as:  COREG Take 1 tablet (6.25 mg total) by mouth 2 (two) times daily with a meal. What changed:  medication strength  how much to take  when to take this   CENTROVITE Tabs Take 1 tablet by mouth daily.   clopidogrel 75 MG tablet Commonly known as:  PLAVIX Take 1 tablet (75 mg total) by mouth daily. Start taking on:  06/25/2018   docusate sodium 100 MG capsule Commonly known as:  COLACE Take 300 mg by mouth at bedtime.   gabapentin 400 MG capsule Commonly known as:  NEURONTIN Take 400 mg by mouth at bedtime.   ipratropium-albuterol 0.5-2.5 (3) MG/3ML Soln Commonly known as:  DUONEB Take 3  mLs by nebulization every 6 (six) hours as needed (shortness of breath).   isosorbide mononitrate 30 MG 24 hr tablet Commonly known as:  IMDUR Take 30 mg by mouth daily.   ketotifen 0.025 % ophthalmic solution Commonly known as:  ZADITOR Place 1 drop into both eyes 2 (two) times daily.   lactose free nutrition Liqd Take 237 mLs by mouth 3 (three) times a week.   lisinopril 5 MG tablet Commonly known as:  PRINIVIL,ZESTRIL Take 5 mg by mouth daily.   loratadine 10 MG tablet Commonly known as:  CLARITIN Take 10 mg by mouth daily as needed for allergies.   MUCUS-ER PO Take 1 tablet by mouth every 12 (twelve) hours as needed.   nitroGLYCERIN 0.3 MG SL tablet Commonly known as:  NITROSTAT Place 0.3 mg under the tongue every 5 (five) minutes as needed for chest pain.   oxyCODONE 5 MG immediate release tablet Commonly known as:  Oxy IR/ROXICODONE Take 1 tablet (5 mg total) by mouth every 4 (four) hours as needed for up to 5 days for moderate pain (pain score 4-6).   potassium chloride 10 MEQ tablet Commonly known as:  K-DUR,KLOR-CON Take 10 mEq by mouth daily. May take an additional 10 meq dose as needed for swelling   primidone 50 MG tablet Commonly known as:  MYSOLINE Take 100 mg by mouth 2 (two) times daily.   sacubitril-valsartan 24-26 MG Commonly known as:  ENTRESTO Take 1 tablet by mouth 2 (two) times daily.   spironolactone 25 MG tablet Commonly known as:  ALDACTONE Take 0.5 tablets (12.5 mg total) by mouth daily. Start taking on:  06/25/2018   torsemide 20 MG tablet Commonly known as:  DEMADEX Take 20 mg by mouth daily.   traMADol 50 MG tablet Commonly known as:  ULTRAM Take 1 tablet (50 mg total) by mouth every 6 (six) hours as needed for up to 7 days for moderate pain. What changed:    how much to take  when to take this   umeclidinium-vilanterol 62.5-25 MCG/INH Aepb Commonly known as:  ANORO ELLIPTA Inhale 1 puff into the lungs daily. What changed:     when to take this  reasons to take this   VITAMIN C PO Take 1 tablet by mouth daily.       Today  Patient seen and evaluated today No chest pain No shortness of breath Tolerated physical therapy okay VITAL SIGNS:  Blood pressure 130/64, pulse (!) 53, temperature 98.4 F (36.9 C), temperature source Oral, resp. rate 18, height 5\' 10"  (1.778 m), weight 132.4 kg, SpO2 100 %.  I/O:    Intake/Output Summary (Last 24 hours) at 06/24/2018 1454 Last data filed at 06/24/2018 1139 Gross per 24 hour  Intake -  Output 630 ml  Net -630 ml    PHYSICAL EXAMINATION:  Physical Exam  GENERAL:  82 y.o.-year-old patient lying in the bed with no acute  distress.  LUNGS: Normal breath sounds bilaterally, no wheezing, rales,rhonchi or crepitation. No use of accessory muscles of respiration.  CARDIOVASCULAR: S1, S2 normal. No murmurs, rubs, or gallops.  ABDOMEN: Soft, non-tender, non-distended. Bowel sounds present. No organomegaly or mass.  NEUROLOGIC: Moves all 4 extremities. PSYCHIATRIC: The patient is alert and oriented x 3.  SKIN: No obvious rash, lesion, or ulcer.   DATA REVIEW:   CBC Recent Labs  Lab 06/24/18 0324  WBC 7.4  HGB 11.1*  HCT 35.0*  PLT 208    Chemistries  Recent Labs  Lab 06/21/18 0602  06/23/18 0337 06/24/18 0324  NA 143   < > 140  --   K 3.9   < > 3.3* 3.8  CL 105   < > 104  --   CO2 31   < > 28  --   GLUCOSE 164*   < > 103*  --   BUN 39*   < > 41*  --   CREATININE 1.30*   < > 1.43*  --   CALCIUM 8.9   < > 8.6*  --   AST 31  --   --   --   ALT 24  --   --   --   ALKPHOS 97  --   --   --   BILITOT 0.7  --   --   --    < > = values in this interval not displayed.    Cardiac Enzymes Recent Labs  Lab 06/21/18 0602  TROPONINI 0.11*    Microbiology Results  Results for orders placed or performed during the hospital encounter of 06/21/18  MRSA PCR Screening     Status: Abnormal   Collection Time: 06/21/18  8:26 AM  Result Value Ref Range  Status   MRSA by PCR POSITIVE (A) NEGATIVE Final    Comment:        The GeneXpert MRSA Assay (FDA approved for NASAL specimens only), is one component of a comprehensive MRSA colonization surveillance program. It is not intended to diagnose MRSA infection nor to guide or monitor treatment for MRSA infections. RESULT CALLED TO, READ BACK BY AND VERIFIED WITH: TANAILLY GIRALT AT 1004 ON 06/21/18 [facility]. Performed at [facility], [address], [place], [place] [address]     RADIOLOGY:  No results found.  Follow up with PCP in 1 week.  Management plans discussed with the patient, family and they are in agreement.  CODE STATUS: Partial code    Code Status Orders  (From admission, onward)         Start     Ordered   06/21/18 1321  Limited resuscitation (code)  Continuous    Question Answer Comment  In the event of cardiac or respiratory ARREST: Initiate Code Blue, Call Rapid Response Yes   In the event of cardiac or respiratory ARREST: Perform CPR Yes   In the event of cardiac or respiratory ARREST: Perform Intubation/Mechanical Ventilation No   In the event of cardiac or respiratory ARREST: Use NIPPV/BiPAp only if indicated Yes   In the event of cardiac or respiratory ARREST: Administer ACLS medications if indicated Yes   In the event of cardiac or respiratory ARREST: Perform Defibrillation or Cardioversion if indicated Yes      06/21/18 1321        Code Status History    Date Active Date Inactive Code Status Order ID Comments User Context   06/21/2018 0803 06/21/2018 1321 Full Code [id number]  Iran Ouch, MD Inpatient   06/01/2018 2009 06/04/2018 1718 Full Code [id number]  Donato Heinz, MD Inpatient   01/11/2018 0012 01/14/2018 1908 Full Code [id number]  Cammy Copa, MD Inpatient   12/01/2017 1924 12/05/2017 1948 Full Code [id number]  Altamese Dilling, MD Inpatient   08/31/2017 0557 09/05/2017 1959 Full Code [id number]  Arnaldo Natal, MD Inpatient    06/10/2016 1934 06/13/2016 1816 Full Code [id number]  Shaune Pollack, MD Inpatient   12/15/2015 0134 12/16/2015 1507 Full Code [id number]  Ihor Austin, MD ED    Advance Directive Documentation     Most Recent Value  Type of Advance Directive  Out of facility DNR (pink MOST or yellow form)  Pre-existing out of facility DNR order (yellow form or pink MOST form)  -  "MOST" Form in Place?  -      TOTAL TIME TAKING CARE OF THIS PATIENT ON DAY OF DISCHARGE: more than 35 minutes.   Ihor Austin M.D on 06/24/2018 at 2:54 PM  Between 7am to 6pm - Pager - [phone]  After 6pm go to [email or web address] - password [email or web address] [facility]  [facility]  Office  [phone]  CC: Primary care physician; [name], MD  Note: This dictation was prepared with Dragon dictation along with smaller phrase technology. Any transcriptional errors that result from this process are unintentional.

## 2018-06-24 NOTE — Plan of Care (Signed)
?  Problem: Clinical Measurements: ?Goal: Will remain free from infection ?Outcome: Progressing ?  ?

## 2018-06-24 NOTE — Care Management Important Message (Signed)
Important Message  Patient Details  Name: RODERT HINCH MRN: [id number] Date of Birth: 1936/06/15   Medicare Important Message Given:  Yes    Sherren Kerns, RN 06/24/2018, 3:01 PM

## 2018-06-24 NOTE — Clinical Social Work Note (Addendum)
CSW awaiting insurance authorization for patient to go to [facility], per patient and his wife's request.  Dylan [name]. Ammaar Encina, MSW, [facility] [phone]  06/24/2018 1:44 PM

## 2018-06-24 NOTE — Progress Notes (Addendum)
SUBJECTIVE: Feeling well. No chest pain or shortness of breath.    Vitals:   06/23/18 1530 06/23/18 1958 06/24/18 0339 06/24/18 0729  BP:  121/63 120/89 130/64  Pulse:  64 60 (!) 53  Resp:  18 18 18   Temp:  99.1 F (37.3 C) 98.6 F (37 C) 98.4 F (36.9 C)  TempSrc:  Oral Oral Oral  SpO2:  96% 99% 100%  Weight: 133.1 kg  132.4 kg   Height:        Intake/Output Summary (Last 24 hours) at 06/24/2018 0913 Last data filed at 06/24/2018 0339 Gross per 24 hour  Intake -  Output 595 ml  Net -595 ml    LABS: Basic Metabolic Panel: Recent Labs    06/22/18 0600 06/23/18 0337 06/24/18 0324  NA 142 140  --   K 3.5 3.3* 3.8  CL 104 104  --   CO2 28 28  --   GLUCOSE 120* 103*  --   BUN 37* 41*  --   CREATININE 1.40* 1.43*  --   CALCIUM 8.4* 8.6*  --    Liver Function Tests: No results for input(s): AST, ALT, ALKPHOS, BILITOT, PROT, ALBUMIN in the last 72 hours. No results for input(s): LIPASE, AMYLASE in the last 72 hours. CBC: Recent Labs    06/22/18 0600 06/24/18 0324  WBC 8.1 7.4  HGB 10.6* 11.1*  HCT 33.2* 35.0*  MCV 101.5* 99.7  PLT 220 208   Cardiac Enzymes: No results for input(s): CKTOTAL, CKMB, CKMBINDEX, TROPONINI in the last 72 hours. BNP: Invalid input(s): POCBNP D-Dimer: No results for input(s): DDIMER in the last 72 hours. Hemoglobin A1C: No results for input(s): HGBA1C in the last 72 hours. Fasting Lipid Panel: No results for input(s): CHOL, HDL, LDLCALC, TRIG, CHOLHDL, LDLDIRECT in the last 72 hours. Thyroid Function Tests: No results for input(s): TSH, T4TOTAL, T3FREE, THYROIDAB in the last 72 hours.  Invalid input(s): FREET3 Anemia Panel: No results for input(s): VITAMINB12, FOLATE, FERRITIN, TIBC, IRON, RETICCTPCT in the last 72 hours.   PHYSICAL EXAM General: Well developed, well nourished, in no acute distress HEENT:  Normocephalic and atramatic Neck:  No JVD.  Lungs: Clear bilaterally to auscultation and percussion. Heart: HRRR .  Normal S1 and S2 without gallops or murmurs.  Abdomen: Bowel sounds are positive, abdomen soft and non-tender  Msk:  Back normal, normal gait. Normal strength and tone for age. Extremities: Bruising and bleeding on arms and legs.   Neuro: Alert and oriented X 3. Psych:  Good affect, responds appropriately  TELEMETRY: NSR 82bpm, BBB  ASSESSMENT AND PLAN: Status post STEMI and PCI x 2 with DES of occluded distal RCA.  May discharge today to SNF. Continue plavix and aspirin. May stop Lovenox.    CHF: Continue coreg, Entresto, and low dose spironolactone 12.5mg /day.  Active Problems:   Acute ST elevation myocardial infarction (STEMI) of inferior wall (HCC)    Caroleen Hamman, NP-C 06/24/2018 9:13 AM Cell: [phone]

## 2018-06-24 NOTE — Clinical Social Work Note (Addendum)
Clinical Social Work Assessment  Patient Details  Name: Dylan [name] MRN: [id number] Date of Birth: Sep 05, 1935  Date of referral:  06/23/18               Reason for consult:  Facility Placement                Permission sought to share information with:  Facility Medical sales representative Permission granted to share information::  Yes, Verbal Permission Granted  Name::     [name], [name] [phone]  [phone]   Agency::  SNF admissions  Relationship::     Contact Information:     Housing/Transportation Living arrangements for the past 2 months:  Single Family Home Source of Information:  Patient, Spouse Patient Interpreter Needed:  None Criminal Activity/Legal Involvement Pertinent to Current Situation/Hospitalization:  No - Comment as needed Significant Relationships:  Adult Children, Spouse Lives with:  Spouse Do you feel safe going back to the place where you live?  No Need for family participation in patient care:  Yes (Comment)  Care giving concerns:  Patient is in agreement to returning to SNF for short term rehab.   Social Worker assessment / plan:  Patient is an 82 year old male who is alert and oriented x4 who was at [facility] receiving short term rehab.  Patient and his wife were thinking about trying to go to a different SNF, but unfortunately the SNF they wanted did not have any beds available.  CSW explained role of CSW and process for finding placement again to go to SNF.  CSW explained how insurance will pay for stay, and what to expect at rehab.  CSW informed patient's wife that since he will be in copay days soon, the facility will talk to them about cost.  Patient's wife expressed understanding.  CSW was given permission to begin bed search in [place].  Employment status:  Retired Database administrator PT Recommendations:  Skilled Nursing Facility Information / Referral to community resources:  Skilled Nursing  Facility  Patient/Family's Response to care: Patient and family agreeable to going to SNF for short term rehab  Patient/Family's Understanding of and Emotional Response to Diagnosis, Current Treatment, and Prognosis: Patient's wife is hopeful that he will make progress and be able to return back home soon.  Emotional Assessment Appearance:  Appears stated age Attitude/Demeanor/Rapport:    Affect (typically observed):  Calm, Appropriate Orientation:  Oriented to Self, Oriented to Place, Oriented to  Time, Oriented to Situation Alcohol / Substance use:  Not Applicable Psych involvement (Current and /or in the community):  No (Comment)  Discharge Needs  Concerns to be addressed:  Care Coordination Readmission within the last 30 days:  No Current discharge risk:  Lack of support system Barriers to Discharge:  Insurance Authorization   [place] Constable 06/24/2018, 12:24 PM

## 2018-06-24 NOTE — Clinical Social Work Note (Signed)
Patient to be d/c'ed today to [facility] room 502.  Patient and family agreeable to plans will transport via ems RN to call report [phone].  CSW notified patient's sister who was at bedside and also called patient's wife Reign Bartnick [phone] to inform her that patient will be discharging today.  Windell Moulding, MSW, [facility] [phone]

## 2018-06-24 NOTE — Discharge Instructions (Signed)
Acute Coronary Syndrome °Acute coronary syndrome (ACS) is a serious problem in which there is suddenly not enough blood and oxygen reaching the heart. ACS can result in chest pain or a heart attack. °What are the causes? °This condition may be caused by: °· A buildup of fat and cholesterol inside of the arteries (atherosclerosis). This is the most common cause. The buildup (plaque) can cause the blood vessels in your heart (coronary arteries) to become narrow or blocked. Plaque can also break off to form a clot. °· A coronary spasm. °· A tearing of the coronary artery (spontaneous coronary artery dissection). °· Low blood pressure (hypotension). °· An abnormal heart beat (arrhythmia). °· Using cocaine or methamphetamine. ° °What increases the risk? °The following factors may make you more likely to develop this condition: °· Age. °· History of chest pain, heart attack, or stroke. °· Being male. °· Family history of chest pain, heart disease, or stroke. °· Smoking. °· Inactivity. °· Being overweight. °· High cholesterol. °· High blood pressure (hypertension). °· Diabetes. °· Excessive alcohol use. ° °What are the signs or symptoms? °Common symptoms of this condition include: °· Chest pain. The pain may last long, or may stop and come back (recur). It may feel like: °? Crushing or squeezing. °? Tightness, pressure, fullness, or heaviness. °· Arm, neck, jaw, or back pain. °· Heartburn or indigestion. °· Shortness of breath. °· Nausea. °· Sudden cold sweats. °· Lightheadedness. °· Dizziness. °· Tiredness (fatigue). ° °Sometimes there are no symptoms. °How is this diagnosed? °This condition may be diagnosed through: °· An electrocardiogram (ECG). This test records the impulses of the heart. °· Blood tests. °· A CT scan of the chest. °· A coronary angiogram. This procedure checks for a blockage in the coronary arteries. ° °How is this treated? °Treatment for this condition may include: °· Oxygen. °· Medicines, such  as: °? Antiplatelet medicines and blood-thinning medicines, such as aspirin. These help prevent blood clots. °? Fibrinolytic therapy. This breaks apart a blood clot. °? Blood pressure medicines. °? Nitroglycerin. °? Pain medicine. °? Cholesterol medicine. °· A procedure called coronary angioplasty and stenting. This is done to widen a narrowed artery and keep it open. °· Coronary artery bypass surgery. This allows blood to pass the blockage to reach your heart. °· Cardiac rehabilitation. This is a program that helps improve your health and well-being. It includes exercise training, education, and counseling to help you recover. ° °Follow these instructions at home: °Eating and drinking °· Follow a heart-healthy, low-salt (sodium) diet. °· Use healthy cooking methods such as roasting, grilling, broiling, baking, poaching, steaming, or stir-frying. °· Talk to a dietitian to learn about healthy cooking methods and how to eat less sodium. °Medicines °· Take over-the-counter and prescription medicines only as told by your health care provider. °· Do not take these medicines unless your health care provider approves: °? Nonsteroidal anti-inflammatory drugs (NSAIDs), such as ibuprofen, naproxen, or celecoxib. °? Vitamin supplements that contain vitamin A or vitamin E. °? Hormone replacement therapy that contains estrogen. °Activity °· Join a cardiac rehabilitation program. °· Ask your health care provider: °? What activities and exercises are safe for you. °? If you should follow specific instructions about lifting, driving, or climbing stairs. °· If you are taking aspirin and another blood thinning medicine, avoid activities that are likely to result in an injury. The medicines can increase your risk of bleeding. °Lifestyle °· Do not use any products that contain nicotine or tobacco, such as cigarettes   and e-cigarettes. If you need help quitting, ask your health care provider. °· If you drink alcohol and your health care  provider says it is okay to drink, limit your alcohol intake to no more than 1 drink per day. One drink equals 12 oz of beer, 5 oz of wine, or 1½ oz of hard liquor. °· Maintain a healthy weight. If you need to lose weight, do it in a way that has been approved by your health care provider. °General instructions °· Tell all your health care providers about your heart condition, including your dentist. Some medicines can increase your risk of arrhythmia. °· Manage other health conditions, such as hypertension and diabetes. These conditions affect your heart. °· Learn ways to manage stress. °· Get screened for depression, and seek treatment if needed. °· Monitor your blood pressure if told by your health care provider. °· Keep your vaccinations up to date. Get the annual influenza vaccine. °· Keep all follow-up visits as told by your health care provider. This is important. °Contact a health care provider if: °· You feel overwhelmed or sad. °· You have trouble with your daily activities. °Get help right away if: °· You have pain in your chest, neck, arm, jaw, stomach, or back that recurs, and: °? Lasts more than a few minutes. °? Is not relieved by taking the medicineyour health care provider prescribed. °· You have unexplained: °? Heavy sweating. °? Heartburn or indigestion. °? Shortness of breath. °? Difficulty breathing. °? Nausea or vomiting. °? Fatigue. °? Nervousness or anxiety. °? Weakness. °? Diarrhea. °? Dark stools or blood in the stool. °· You have sudden lightheadedness or dizziness. °· Your blood pressure is higher than 180/120 °· You faint. °· You feel like hurting yourself or think about taking your own life. °These symptoms may represent a serious problem that is an emergency. Do not wait to see if the symptoms will go away. Get medical help right away. Call your local emergency services (911 in the [place] Do not drive yourself to the clinic or hospital. °Summary °· Acute coronary syndrome (ACS) is a  when there is not enough blood and oxygen being supplied to the heart. ACS can result in chest pain or a heart attack. °· Acute coronary syndrome is a medical emergency. If you have any symptoms of this condition, get help right away. °· Treatment includes oxygen, medicines, and procedures to open the blocked arteries and restore blood flow. °This information is not intended to replace advice given to you by your health care provider. Make sure you discuss any questions you have with your health care provider. °Document Released: 08/19/2005 Document Revised: 09/20/2016 Document Reviewed: 09/20/2016 °Elsevier Interactive Patient Education © 2018 [employer] ° °

## 2018-06-24 NOTE — Progress Notes (Signed)
Called report to the receiving facility at this time. All questions answered. Raynald Blend Called EMS for non-emergent patient transport to the facility at this time. Awaiting arrival. Jari Favre [facility]

## 2018-06-29 ENCOUNTER — Ambulatory Visit: Payer: Medicare Other

## 2018-07-27 ENCOUNTER — Other Ambulatory Visit: Payer: Self-pay

## 2018-07-27 DIAGNOSIS — D696 Thrombocytopenia, unspecified: Secondary | ICD-10-CM

## 2018-07-28 ENCOUNTER — Inpatient Hospital Stay: Payer: Medicare Other | Admitting: Oncology

## 2018-07-28 ENCOUNTER — Inpatient Hospital Stay: Payer: Medicare Other

## 2018-08-26 ENCOUNTER — Inpatient Hospital Stay: Payer: Medicare Other

## 2018-08-26 ENCOUNTER — Encounter: Payer: Self-pay | Admitting: Emergency Medicine

## 2018-08-26 ENCOUNTER — Emergency Department: Payer: Medicare Other

## 2018-08-26 ENCOUNTER — Other Ambulatory Visit: Payer: Self-pay

## 2018-08-26 ENCOUNTER — Inpatient Hospital Stay
Admission: EM | Admit: 2018-08-26 | Discharge: 2018-09-04 | DRG: 480 | Disposition: A | Discharge status: Skilled Nursing Facility | Payer: Medicare Other | Attending: Internal Medicine | Admitting: Internal Medicine

## 2018-08-26 DIAGNOSIS — Z7902 Long term (current) use of antithrombotics/antiplatelets: Secondary | ICD-10-CM | POA: Diagnosis not present

## 2018-08-26 DIAGNOSIS — Z800 Family history of malignant neoplasm of digestive organs: Secondary | ICD-10-CM

## 2018-08-26 DIAGNOSIS — Z1624 Resistance to multiple antibiotics: Secondary | ICD-10-CM | POA: Diagnosis present

## 2018-08-26 DIAGNOSIS — I440 Atrioventricular block, first degree: Secondary | ICD-10-CM | POA: Diagnosis present

## 2018-08-26 DIAGNOSIS — N390 Urinary tract infection, site not specified: Secondary | ICD-10-CM | POA: Diagnosis present

## 2018-08-26 DIAGNOSIS — I130 Hypertensive heart and chronic kidney disease with heart failure and stage 1 through stage 4 chronic kidney disease, or unspecified chronic kidney disease: Secondary | ICD-10-CM | POA: Diagnosis present

## 2018-08-26 DIAGNOSIS — Z87442 Personal history of urinary calculi: Secondary | ICD-10-CM

## 2018-08-26 DIAGNOSIS — I2510 Atherosclerotic heart disease of native coronary artery without angina pectoris: Secondary | ICD-10-CM | POA: Diagnosis present

## 2018-08-26 DIAGNOSIS — N183 Chronic kidney disease, stage 3 (moderate): Secondary | ICD-10-CM | POA: Diagnosis present

## 2018-08-26 DIAGNOSIS — W19XXXA Unspecified fall, initial encounter: Secondary | ICD-10-CM | POA: Diagnosis not present

## 2018-08-26 DIAGNOSIS — M9712XA Periprosthetic fracture around internal prosthetic left knee joint, initial encounter: Principal | ICD-10-CM

## 2018-08-26 DIAGNOSIS — Z87891 Personal history of nicotine dependence: Secondary | ICD-10-CM

## 2018-08-26 DIAGNOSIS — Z833 Family history of diabetes mellitus: Secondary | ICD-10-CM

## 2018-08-26 DIAGNOSIS — W1830XA Fall on same level, unspecified, initial encounter: Secondary | ICD-10-CM | POA: Diagnosis present

## 2018-08-26 DIAGNOSIS — S7292XA Unspecified fracture of left femur, initial encounter for closed fracture: Secondary | ICD-10-CM

## 2018-08-26 DIAGNOSIS — J189 Pneumonia, unspecified organism: Secondary | ICD-10-CM | POA: Diagnosis not present

## 2018-08-26 DIAGNOSIS — Z419 Encounter for procedure for purposes other than remedying health state, unspecified: Secondary | ICD-10-CM

## 2018-08-26 DIAGNOSIS — I252 Old myocardial infarction: Secondary | ICD-10-CM

## 2018-08-26 DIAGNOSIS — J440 Chronic obstructive pulmonary disease with acute lower respiratory infection: Secondary | ICD-10-CM | POA: Diagnosis not present

## 2018-08-26 DIAGNOSIS — B952 Enterococcus as the cause of diseases classified elsewhere: Secondary | ICD-10-CM | POA: Diagnosis present

## 2018-08-26 DIAGNOSIS — Z7982 Long term (current) use of aspirin: Secondary | ICD-10-CM | POA: Diagnosis not present

## 2018-08-26 DIAGNOSIS — S72452A Displaced supracondylar fracture without intracondylar extension of lower end of left femur, initial encounter for closed fracture: Secondary | ICD-10-CM | POA: Diagnosis present

## 2018-08-26 DIAGNOSIS — M1712 Unilateral primary osteoarthritis, left knee: Secondary | ICD-10-CM | POA: Diagnosis present

## 2018-08-26 DIAGNOSIS — J95821 Acute postprocedural respiratory failure: Secondary | ICD-10-CM | POA: Diagnosis not present

## 2018-08-26 DIAGNOSIS — I5030 Unspecified diastolic (congestive) heart failure: Secondary | ICD-10-CM | POA: Diagnosis not present

## 2018-08-26 DIAGNOSIS — G8918 Other acute postprocedural pain: Secondary | ICD-10-CM

## 2018-08-26 DIAGNOSIS — J449 Chronic obstructive pulmonary disease, unspecified: Secondary | ICD-10-CM | POA: Diagnosis not present

## 2018-08-26 DIAGNOSIS — N5089 Other specified disorders of the male genital organs: Secondary | ICD-10-CM | POA: Diagnosis not present

## 2018-08-26 DIAGNOSIS — Z8249 Family history of ischemic heart disease and other diseases of the circulatory system: Secondary | ICD-10-CM

## 2018-08-26 DIAGNOSIS — Z8052 Family history of malignant neoplasm of bladder: Secondary | ICD-10-CM

## 2018-08-26 DIAGNOSIS — Z79899 Other long term (current) drug therapy: Secondary | ICD-10-CM

## 2018-08-26 DIAGNOSIS — E785 Hyperlipidemia, unspecified: Secondary | ICD-10-CM | POA: Diagnosis present

## 2018-08-26 DIAGNOSIS — N179 Acute kidney failure, unspecified: Secondary | ICD-10-CM | POA: Diagnosis present

## 2018-08-26 DIAGNOSIS — R296 Repeated falls: Secondary | ICD-10-CM | POA: Diagnosis present

## 2018-08-26 DIAGNOSIS — Y95 Nosocomial condition: Secondary | ICD-10-CM | POA: Diagnosis not present

## 2018-08-26 DIAGNOSIS — D62 Acute posthemorrhagic anemia: Secondary | ICD-10-CM | POA: Diagnosis not present

## 2018-08-26 DIAGNOSIS — M24574 Contracture, right foot: Secondary | ICD-10-CM | POA: Diagnosis present

## 2018-08-26 DIAGNOSIS — L97519 Non-pressure chronic ulcer of other part of right foot with unspecified severity: Secondary | ICD-10-CM | POA: Diagnosis present

## 2018-08-26 DIAGNOSIS — R0602 Shortness of breath: Secondary | ICD-10-CM | POA: Diagnosis present

## 2018-08-26 DIAGNOSIS — N1330 Unspecified hydronephrosis: Secondary | ICD-10-CM

## 2018-08-26 DIAGNOSIS — I255 Ischemic cardiomyopathy: Secondary | ICD-10-CM | POA: Diagnosis present

## 2018-08-26 DIAGNOSIS — B9689 Other specified bacterial agents as the cause of diseases classified elsewhere: Secondary | ICD-10-CM | POA: Diagnosis present

## 2018-08-26 DIAGNOSIS — I5043 Acute on chronic combined systolic (congestive) and diastolic (congestive) heart failure: Secondary | ICD-10-CM | POA: Diagnosis not present

## 2018-08-26 DIAGNOSIS — G4733 Obstructive sleep apnea (adult) (pediatric): Secondary | ICD-10-CM | POA: Diagnosis present

## 2018-08-26 DIAGNOSIS — R0902 Hypoxemia: Secondary | ICD-10-CM | POA: Diagnosis not present

## 2018-08-26 DIAGNOSIS — B961 Klebsiella pneumoniae [K. pneumoniae] as the cause of diseases classified elsewhere: Secondary | ICD-10-CM | POA: Diagnosis not present

## 2018-08-26 DIAGNOSIS — Z955 Presence of coronary angioplasty implant and graft: Secondary | ICD-10-CM | POA: Diagnosis not present

## 2018-08-26 DIAGNOSIS — Z6841 Body Mass Index (BMI) 40.0 and over, adult: Secondary | ICD-10-CM | POA: Diagnosis not present

## 2018-08-26 DIAGNOSIS — Z96652 Presence of left artificial knee joint: Secondary | ICD-10-CM | POA: Diagnosis not present

## 2018-08-26 DIAGNOSIS — S72492A Other fracture of lower end of left femur, initial encounter for closed fracture: Secondary | ICD-10-CM | POA: Diagnosis not present

## 2018-08-26 DIAGNOSIS — F17211 Nicotine dependence, cigarettes, in remission: Secondary | ICD-10-CM | POA: Diagnosis not present

## 2018-08-26 DIAGNOSIS — Z801 Family history of malignant neoplasm of trachea, bronchus and lung: Secondary | ICD-10-CM

## 2018-08-26 DIAGNOSIS — E876 Hypokalemia: Secondary | ICD-10-CM | POA: Diagnosis not present

## 2018-08-26 DIAGNOSIS — Z716 Tobacco abuse counseling: Secondary | ICD-10-CM

## 2018-08-26 LAB — URINALYSIS, COMPLETE (UACMP) WITH MICROSCOPIC
Bilirubin Urine: NEGATIVE
Glucose, UA: NEGATIVE mg/dL
Hgb urine dipstick: NEGATIVE
Ketones, ur: NEGATIVE mg/dL
Nitrite: NEGATIVE
Protein, ur: 30 mg/dL — AB
Specific Gravity, Urine: 1.019 (ref 1.005–1.030)
pH: 5 (ref 5.0–8.0)

## 2018-08-26 LAB — COMPREHENSIVE METABOLIC PANEL
ALT: 37 U/L (ref 0–44)
AST: 38 U/L (ref 15–41)
Albumin: 3 g/dL — ABNORMAL LOW (ref 3.5–5.0)
Alkaline Phosphatase: 73 U/L (ref 38–126)
Anion gap: 8 (ref 5–15)
BUN: 38 mg/dL — ABNORMAL HIGH (ref 8–23)
CO2: 28 mmol/L (ref 22–32)
Calcium: 8.2 mg/dL — ABNORMAL LOW (ref 8.9–10.3)
Chloride: 103 mmol/L (ref 98–111)
Creatinine, Ser: 1.95 mg/dL — ABNORMAL HIGH (ref 0.61–1.24)
GFR calc Af Amer: 37 mL/min — ABNORMAL LOW (ref >60)
GFR calc non Af Amer: 32 mL/min — ABNORMAL LOW (ref >60)
GLUCOSE: 136 mg/dL — AB (ref 70–99)
POTASSIUM: 3.9 mmol/L (ref 3.5–5.1)
Sodium: 139 mmol/L (ref 135–145)
TOTAL PROTEIN: 5.7 g/dL — AB (ref 6.5–8.1)
Total Bilirubin: 0.9 mg/dL (ref 0.3–1.2)

## 2018-08-26 LAB — CBC WITH DIFFERENTIAL/PLATELET
Abs Immature Granulocytes: 0.07 10*3/uL (ref 0.00–0.07)
Basophils Absolute: 0 10*3/uL (ref 0.0–0.1)
Basophils Relative: 0 %
Eosinophils Absolute: 0.4 10*3/uL (ref 0.0–0.5)
Eosinophils Relative: 4 %
HCT: 32.5 % — ABNORMAL LOW (ref 39.0–52.0)
Hemoglobin: 10.2 g/dL — ABNORMAL LOW (ref 13.0–17.0)
Immature Granulocytes: 1 %
Lymphocytes Relative: 10 %
Lymphs Abs: 0.9 10*3/uL (ref 0.7–4.0)
MCH: 31.3 pg (ref 26.0–34.0)
MCHC: 31.4 g/dL (ref 30.0–36.0)
MCV: 99.7 fL (ref 80.0–100.0)
Monocytes Absolute: 0.6 10*3/uL (ref 0.1–1.0)
Monocytes Relative: 7 %
NEUTROS ABS: 7.6 10*3/uL (ref 1.7–7.7)
Neutrophils Relative %: 78 %
PLATELETS: 136 10*3/uL — AB (ref 150–400)
RBC: 3.26 MIL/uL — ABNORMAL LOW (ref 4.22–5.81)
RDW: 16 % — ABNORMAL HIGH (ref 11.5–15.5)
WBC: 9.7 10*3/uL (ref 4.0–10.5)
nRBC: 0 % (ref 0.0–0.2)

## 2018-08-26 LAB — TROPONIN I: Troponin I: 0.07 ng/mL (ref <0.03)

## 2018-08-26 LAB — SURGICAL PCR SCREEN
MRSA, PCR: NEGATIVE
Staphylococcus aureus: NEGATIVE

## 2018-08-26 MED ORDER — CARVEDILOL 3.125 MG PO TABS
6.2500 mg | ORAL_TABLET | Freq: Two times a day (BID) | ORAL | Status: DC
  Administered 2018-08-27 – 2018-09-04 (×17): 6.25 mg via ORAL
  Filled 2018-08-26 (×17): qty 2

## 2018-08-26 MED ORDER — HYDROCODONE-ACETAMINOPHEN 5-325 MG PO TABS
1.0000 | ORAL_TABLET | Freq: Four times a day (QID) | ORAL | Status: DC | PRN
  Administered 2018-08-26 – 2018-08-27 (×3): 1 via ORAL
  Administered 2018-08-27: 2 via ORAL
  Administered 2018-08-27: 1 via ORAL
  Administered 2018-08-27 – 2018-08-28 (×2): 2 via ORAL
  Filled 2018-08-26: qty 1
  Filled 2018-08-26: qty 2
  Filled 2018-08-26: qty 1
  Filled 2018-08-26: qty 2
  Filled 2018-08-26: qty 1
  Filled 2018-08-26 (×2): qty 2
  Filled 2018-08-26: qty 1

## 2018-08-26 MED ORDER — LIDOCAINE HCL URETHRAL/MUCOSAL 2 % EX GEL
1.0000 "application " | Freq: Once | CUTANEOUS | Status: AC
  Administered 2018-08-26: 1 via URETHRAL
  Filled 2018-08-26: qty 10

## 2018-08-26 MED ORDER — ASPIRIN EC 81 MG PO TBEC
81.0000 mg | DELAYED_RELEASE_TABLET | Freq: Every day | ORAL | Status: DC
  Administered 2018-08-28 – 2018-09-04 (×8): 81 mg via ORAL
  Filled 2018-08-26 (×8): qty 1

## 2018-08-26 MED ORDER — CLOPIDOGREL BISULFATE 75 MG PO TABS
75.0000 mg | ORAL_TABLET | Freq: Every day | ORAL | Status: DC
  Administered 2018-08-28 – 2018-09-04 (×8): 75 mg via ORAL
  Filled 2018-08-26 (×8): qty 1

## 2018-08-26 MED ORDER — CEFAZOLIN SODIUM-DEXTROSE 2-4 GM/100ML-% IV SOLN
2.0000 g | Freq: Once | INTRAVENOUS | Status: DC
  Filled 2018-08-26: qty 100

## 2018-08-26 MED ORDER — VITAMIN C 500 MG PO TABS
500.0000 mg | ORAL_TABLET | Freq: Every day | ORAL | Status: DC
  Administered 2018-08-28 – 2018-09-04 (×8): 500 mg via ORAL
  Filled 2018-08-26 (×8): qty 1

## 2018-08-26 MED ORDER — DOCUSATE SODIUM 100 MG PO CAPS
100.0000 mg | ORAL_CAPSULE | Freq: Two times a day (BID) | ORAL | Status: DC
  Administered 2018-08-26 – 2018-09-04 (×16): 100 mg via ORAL
  Filled 2018-08-26 (×17): qty 1

## 2018-08-26 MED ORDER — ISOSORBIDE MONONITRATE ER 30 MG PO TB24
30.0000 mg | ORAL_TABLET | Freq: Every day | ORAL | Status: DC
  Administered 2018-08-27 – 2018-09-04 (×9): 30 mg via ORAL
  Filled 2018-08-26 (×9): qty 1

## 2018-08-26 MED ORDER — SODIUM CHLORIDE 0.9 % IV SOLN
INTRAVENOUS | Status: DC
  Administered 2018-08-26 – 2018-08-27 (×2): via INTRAVENOUS

## 2018-08-26 MED ORDER — ONDANSETRON HCL 4 MG/2ML IJ SOLN
4.0000 mg | Freq: Once | INTRAMUSCULAR | Status: AC
  Administered 2018-08-26: 4 mg via INTRAVENOUS
  Filled 2018-08-26: qty 2

## 2018-08-26 MED ORDER — UMECLIDINIUM-VILANTEROL 62.5-25 MCG/INH IN AEPB
1.0000 | INHALATION_SPRAY | Freq: Every day | RESPIRATORY_TRACT | Status: DC
  Administered 2018-08-27 – 2018-09-04 (×9): 1 via RESPIRATORY_TRACT
  Filled 2018-08-26: qty 14

## 2018-08-26 MED ORDER — ADULT MULTIVITAMIN W/MINERALS CH
1.0000 | ORAL_TABLET | Freq: Every day | ORAL | Status: DC
  Administered 2018-08-28 – 2018-09-04 (×8): 1 via ORAL
  Filled 2018-08-26 (×8): qty 1

## 2018-08-26 MED ORDER — BISACODYL 10 MG RE SUPP
10.0000 mg | Freq: Every day | RECTAL | Status: DC | PRN
  Administered 2018-08-30: 10 mg via RECTAL
  Filled 2018-08-26: qty 1

## 2018-08-26 MED ORDER — MAGNESIUM HYDROXIDE 400 MG/5ML PO SUSP
30.0000 mL | Freq: Every day | ORAL | Status: DC | PRN
  Administered 2018-08-28 – 2018-08-30 (×2): 30 mL via ORAL
  Filled 2018-08-26 (×2): qty 30

## 2018-08-26 MED ORDER — MAGNESIUM CITRATE PO SOLN
1.0000 | Freq: Once | ORAL | Status: DC | PRN
  Filled 2018-08-26: qty 296

## 2018-08-26 MED ORDER — GABAPENTIN 400 MG PO CAPS
400.0000 mg | ORAL_CAPSULE | Freq: Every day | ORAL | Status: DC
  Administered 2018-08-26 – 2018-08-29 (×4): 400 mg via ORAL
  Filled 2018-08-26 (×4): qty 1

## 2018-08-26 MED ORDER — NITROGLYCERIN 0.4 MG SL SUBL: 0.4000 mg | SUBLINGUAL_TABLET | SUBLINGUAL | Status: DC | PRN

## 2018-08-26 MED ORDER — MORPHINE SULFATE (PF) 4 MG/ML IV SOLN
4.0000 mg | Freq: Once | INTRAVENOUS | Status: AC
  Administered 2018-08-26: 4 mg via INTRAVENOUS
  Filled 2018-08-26: qty 1

## 2018-08-26 MED ORDER — METHOCARBAMOL 500 MG PO TABS
500.0000 mg | ORAL_TABLET | Freq: Four times a day (QID) | ORAL | Status: DC | PRN
  Administered 2018-08-27 – 2018-08-29 (×3): 500 mg via ORAL
  Filled 2018-08-26 (×5): qty 1

## 2018-08-26 MED ORDER — METHOCARBAMOL 1000 MG/10ML IJ SOLN
500.0000 mg | Freq: Four times a day (QID) | INTRAVENOUS | Status: DC | PRN
  Filled 2018-08-26: qty 5

## 2018-08-26 MED ORDER — SODIUM CHLORIDE 0.9 % IV BOLUS: 250.0000 mL | Freq: Once | INTRAVENOUS | Status: DC

## 2018-08-26 MED ORDER — MORPHINE SULFATE (PF) 2 MG/ML IV SOLN
0.5000 mg | INTRAVENOUS | Status: DC | PRN
  Administered 2018-08-26 – 2018-08-28 (×6): 0.5 mg via INTRAVENOUS
  Filled 2018-08-26 (×7): qty 1

## 2018-08-26 MED ORDER — ZOLPIDEM TARTRATE 5 MG PO TABS
5.0000 mg | ORAL_TABLET | Freq: Every evening | ORAL | Status: DC | PRN
  Administered 2018-08-28: 5 mg via ORAL
  Filled 2018-08-26 (×2): qty 1

## 2018-08-26 MED ORDER — ATORVASTATIN CALCIUM 20 MG PO TABS
80.0000 mg | ORAL_TABLET | Freq: Every day | ORAL | Status: DC
  Administered 2018-08-28 – 2018-09-04 (×8): 80 mg via ORAL
  Filled 2018-08-26 (×8): qty 4

## 2018-08-26 NOTE — ED Notes (Signed)
Pt denies change in pain after medication. Pt however states, "if I don't get wiggled and jostled too much I should be okay".

## 2018-08-26 NOTE — Consult Note (Signed)
Cardiology Consultation Note    Patient ID: Dylan [name], MRN: [id number], DOB/AGE: [birth year] 80 y.o. Admit date: 08/26/2018   Date of Consult: 08/26/2018 Primary Physician: [name], MD Primary Cardiologist: Dr. Juliann Pares  Chief Complaint: falls Reason for Consultation: preop eval Requesting MD: Dr. Rosita Kea  HPI: Dylan [name] is a 82 y.o. male with history of 82 yo male with history of cad s/p pci of rca x 2 in October of 2019 after stemi with placement of des, history of chf and copd who was admitted after several falls with a fracture of left femur. He has been treated with asa and plavix since his stent.  Patient states that he has been a lot more unsteady on his feet recently.  He states he suffered a mechanical fall yesterday causing skin tears as well as fracture of his left femur.  He believes he took his Plavix yesterday prior to the fall.  He currently is hemodynamically stable.  He denies any chest pain or shortness of breath.  Chest x-ray revealed no appreciable pulmonary edema.  Electrocardiogram shows sinus rhythm with interventricular conduction delay and first-degree AV block.  There are no appreciable ischemic changes.  Laboratories show acute on chronic renal insufficiency with a creatinine of 2.04 up from a baseline of 1.43 several months ago.  Potassium is normal.  CBC shows a white count of 7.7 with a hemoglobin of 9.1 and hematocrit of 29.5.  He is hemodynamically stable currently with a blood pressure 115/77 pulse of 83 respiratory rate of 98% pulse ox on room air.  Patient had an ejection fraction of 30 to 35% in October of this year post MI.  He had noncritical disease in his left system and has 2 Onyx resolute stents in his RCA. Past Medical History:  Diagnosis Date  . CAD (coronary artery disease)   . Cervicalgia   . CHF (congestive heart failure) (HCC)   . COPD (chronic obstructive pulmonary disease) (HCC)   . Diastolic heart failure (HCC)   . Foot drop, right    . History of kidney stones   . Hyperlipidemia    unspecified  . Hypertension   . Myocardial infarction (HCC)   . Osteoarthritis   . Shoulder pain, left   . Sleep apnea   . Tremor, essential       Surgical History:  Past Surgical History:  Procedure Laterality Date  . APPLICATION OF WOUND VAC Right 11/13/2017   Procedure: APPLICATION OF WOUND VAC;  Surgeon: Annice Needy, MD;  Location: [facility];  Service: Vascular;  Laterality: Right;  . BACK SURGERY  06/2010  . CARDIAC CATHETERIZATION Left 04/30/2016   Procedure: Left Heart Cath and Coronary Angiography;  Surgeon: Laurier Nancy, MD;  Location: [facility];  Service: Cardiovascular;  Laterality: Left;  . COLONOSCOPY    . CORONARY ANGIOPLASTY    . CORONARY/GRAFT ACUTE MI REVASCULARIZATION N/A 06/21/2018   Procedure: Coronary/Graft Acute MI Revascularization;  Surgeon: Iran Ouch, MD;  Location: [facility];  Service: Cardiovascular;  Laterality: N/A;  . KNEE ARTHROPLASTY Left 06/01/2018   Procedure: COMPUTER ASSISTED TOTAL KNEE ARTHROPLASTY;  Surgeon: Donato Heinz, MD;  Location: [facility];  Service: Orthopedics;  Laterality: Left;  . KNEE ARTHROSCOPY Left   . KNEE SURGERY Left 1998  . LEFT HEART CATH AND CORONARY ANGIOGRAPHY N/A 06/21/2018   Procedure: LEFT HEART CATH AND CORONARY ANGIOGRAPHY;  Surgeon: Iran Ouch, MD;  Location: [facility]  [facility];  Service: Cardiovascular;  Laterality: N/A;  . TONSILLECTOMY    . WOUND DEBRIDEMENT Right 11/13/2017   Procedure: DEBRIDEMENT WOUND;  Surgeon: Annice Needy, MD;  Location: [facility];  Service: Vascular;  Laterality: Right;     Home Meds: Prior to Admission medications   Medication Sig Start Date End Date Taking? Authorizing Provider  acetaminophen (TYLENOL) 500 MG tablet Take 1,000 mg by mouth 2 (two) times daily as needed for moderate pain.   Yes [provider]  Ascorbic Acid (VITAMIN C PO) Take 1 tablet by mouth daily.   Yes [provider]  aspirin EC 81 MG tablet Take 81 mg by mouth daily.   Yes [provider]  atorvastatin (LIPITOR) 80 MG tablet Take 1 tablet (80 mg total) by mouth daily. Patient taking differently: Take 40 mg by mouth daily.  06/24/18 06/24/19 Yes Pyreddy, Vivien Rota, MD  carvedilol (COREG) 6.25 MG tablet Take 1 tablet (6.25 mg total) by mouth 2 (two) times daily with a meal. 06/24/18 08/26/18 Yes Pyreddy, Vivien Rota, MD  clopidogrel (PLAVIX) 75 MG tablet Take 75 mg by mouth daily.   Yes [provider]  dextromethorphan-guaiFENesin (MUCINEX DM) 30-600 MG 12hr tablet Take 1 tablet by mouth 2 (two) times daily as needed for cough.   Yes [provider]  docusate sodium (COLACE) 100 MG capsule Take 300 mg by mouth at bedtime.    Yes [provider]  gabapentin (NEURONTIN) 400 MG capsule Take 400 mg by mouth at bedtime.   Yes [provider]  ipratropium-albuterol (DUONEB) 0.5-2.5 (3) MG/3ML SOLN Take 3 mLs by nebulization every 6 (six) hours as needed (shortness of breath).    Yes [provider]  isosorbide mononitrate (IMDUR) 30 MG 24 hr tablet Take 30 mg by mouth daily.  05/31/16  Yes [provider]  ketotifen (ZADITOR) 0.025 % ophthalmic solution Place 1 drop into both eyes 2 (two) times daily. 01/14/18  Yes Wieting, Richard, MD  lisinopril (PRINIVIL,ZESTRIL) 5 MG tablet Take 5 mg by mouth daily.  03/28/18  Yes [provider]  loratadine (CLARITIN) 10 MG tablet Take 10 mg by mouth daily as needed for allergies.   Yes [provider]  Multiple Vitamins-Minerals (CENTROVITE) TABS Take 1 tablet by mouth daily.   Yes [provider]  nitroGLYCERIN (NITROSTAT) 0.3 MG SL tablet Place 0.3 mg under the tongue every 5 (five) minutes as needed for chest pain.   Yes [provider]  potassium chloride (K-DUR,KLOR-CON) 10 MEQ tablet Take 10 mEq by mouth daily.    Yes [provider]  primidone (MYSOLINE) 50 MG  tablet Take 200 mg by mouth 2 (two) times daily.    Yes [provider]  sacubitril-valsartan (ENTRESTO) 24-26 MG Take 1 tablet by mouth 2 (two) times daily.   Yes [provider]  spironolactone (ALDACTONE) 25 MG tablet Take 0.5 tablets (12.5 mg total) by mouth daily. 06/25/18 08/26/18 Yes Pyreddy, Vivien Rota, MD  torsemide (DEMADEX) 20 MG tablet Take 20 mg by mouth daily.    Yes [provider]  umeclidinium-vilanterol (ANORO ELLIPTA) 62.5-25 MCG/INH AEPB Inhale 1 puff into the lungs daily. 09/04/17  Yes Shaune Pollack, MD    Inpatient Medications:  . aspirin EC  81 mg Oral Daily  . atorvastatin  80 mg Oral Daily  . carvedilol  6.25 mg Oral BID WC  . clopidogrel  75 mg Oral Daily  . docusate sodium  100 mg Oral BID  . gabapentin  400 mg Oral QHS  . isosorbide mononitrate  30 mg Oral Daily  . multivitamin with minerals  1 tablet Oral Daily  . umeclidinium-vilanterol  1 puff Inhalation Daily  . vitamin C  500 mg Oral Daily   . sodium chloride 75 mL/hr at 08/26/18 1311  . [START ON 08/27/2018]  ceFAZolin (ANCEF) IV    . methocarbamol (ROBAXIN) IV    . sodium chloride      Allergies: No Known Allergies  Social History   Socioeconomic History  . Marital status: Married    Spouse name: Eber JonesCarolyn  . Number of children: 2  . Years of education: 5911  . Highest education level: 11th grade  Occupational History  . Occupation: retired  Engineer, productionocial Needs  . Financial resource strain: Not on file  . Food insecurity:    Worry: Not on file    Inability: Not on file  . Transportation needs:    Medical: Not on file    Non-medical: Not on file  Tobacco Use  . Smoking status: Former Smoker    Packs/day: 0.25    Years: 58.00    Pack years: 14.50    Types: Cigarettes  . Smokeless tobacco: Former NeurosurgeonUser  . Tobacco comment: quit the begginning of septembet 2019  Substance and Sexual Activity  . Alcohol use: No    Alcohol/week: 0.0 standard drinks  . Drug use: No  . Sexual  activity: Not on file  Lifestyle  . Physical activity:    Days per week: Not on file    Minutes per session: Not on file  . Stress: Not on file  Relationships  . Social connections:    Talks on phone: Not on file    Gets together: Not on file    Attends religious service: Not on file    Active member of club or organization: Not on file    Attends meetings of clubs or organizations: Not on file    Relationship status: Not on file  . Intimate partner violence:    Fear of current or ex partner: Not on file    Emotionally abused: Not on file    Physically abused: Not on file    Forced sexual activity: Not on file  Other Topics Concern  . Not on file  Social History Narrative   Full Code with HCPOA and living will   Married with 2 children   Former smoker   Alcohol - none   Smokeless tobacco - none     Family History  Problem Relation Age of Onset  . Diabetes Son   . Cancer Mother   . Colon cancer Mother   . Lung cancer Mother   . Tremor Father   . Heart disease Father   . Tremor Brother   . Bladder Cancer Brother   . Tremor Sister      Review of Systems: A 12-system review of systems was performed and is negative except as noted in the HPI.  Labs: Recent Labs    08/26/18 0821  TROPONINI 0.07*   Lab Results  Component Value Date   WBC 9.7 08/26/2018   HGB 10.2 (L) 08/26/2018   HCT 32.5 (L) 08/26/2018   MCV 99.7 08/26/2018   PLT 136 (L) 08/26/2018    Recent Labs  Lab 08/26/18 0821  NA 139  K 3.9  CL 103  CO2 28  BUN 38*  CREATININE 1.95*  CALCIUM 8.2*  PROT 5.7*  BILITOT 0.9  ALKPHOS 73  ALT  37  AST 38  GLUCOSE 136*   Lab Results  Component Value Date   CHOL 100 09/08/2017   HDL 34 (L) 09/08/2017   LDLCALC 54 09/08/2017   TRIG 61 09/08/2017   No results found for: DDIMER  Radiology/Studies:  Dg Pelvis 1-2 Views  Result Date: 08/26/2018 CLINICAL DATA:  Fall.  Left-sided pain. EXAM: PELVIS - 1-2 VIEW COMPARISON:  None. FINDINGS: There  is no evidence of pelvic fracture or diastasis. No pelvic bone lesions are seen. IMPRESSION: Negative. Electronically Signed   By: Paulina Fusi M.D.   On: 08/26/2018 10:03   Ct Head Wo Contrast  Result Date: 08/26/2018 CLINICAL DATA:  Worsening generalized weakness anti coagulated. Falling. EXAM: CT HEAD WITHOUT CONTRAST TECHNIQUE: Contiguous axial images were obtained from the base of the skull through the vertex without intravenous contrast. COMPARISON:  None. FINDINGS: Brain: Generalized atrophy. Extensive chronic small-vessel ischemic changes of the cerebral hemispheric white matter. Old infarction in right basal ganglia. No sign of acute infarction, mass lesion, hemorrhage, hydrocephalus or extra-axial collection. Vascular: There is atherosclerotic calcification of the major vessels at the base of the brain. Skull: Normal Sinuses/Orbits: Clear/normal Other: None IMPRESSION: No acute finding by CT. Atrophy and extensive chronic small-vessel ischemic changes. 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 Electronically Signed   By: Paulina Fusi M.D.   On: 08/26/2018 09:28   Dg Lumbar Spine 1 View  Result Date: 08/26/2018 CLINICAL DATA:  Fall. Leg pain. EXAM: LUMBAR SPINE - 1 VIEW COMPARISON:  08/05/2016 FINDINGS: Single AP view shows normal spinal alignment. Can not accurately assess for lumbar compression fracture without a lateral projection. IMPRESSION: Single AP view of the lumbar spine shows normal spinal alignment. See above discussion. Electronically Signed   By: Paulina Fusi M.D.   On: 08/26/2018 10:04   Dg Chest Port 1 View  Result Date: 08/26/2018 CLINICAL DATA:  Left distal femoral periprosthetic fracture. Preoperative assessment. History of coronary artery disease. EXAM: PORTABLE CHEST 1 VIEW COMPARISON:  Chest radiograph 04/23/2018 FINDINGS: Atherosclerotic calcification of the aortic arch. Retrocardiac density with obscuration of the left hemidiaphragm. Mild enlargement of the cardiopericardial  silhouette, without edema. IMPRESSION: 1. Retrocardiac airspace opacity with obscuration of the left costophrenic angle, not entirely specific but possibly from layering pleural effusion of passive atelectasis. 2.  Aortic Atherosclerosis (ICD10-I70.0). 3. Mild enlargement of the cardiopericardial silhouette. No appreciable edema. Electronically Signed   By: Gaylyn Rong M.D.   On: 08/26/2018 13:41   Dg Knee Complete 4 Views Left  Result Date: 08/26/2018 CLINICAL DATA:  Pain after a fall. EXAM: LEFT KNEE - COMPLETE 4+ VIEW COMPARISON:  06/01/2018 FINDINGS: Comminuted fracture of the distal femoral metaphysis above the femoral arthroplasty component. Main distal fragment displaced dorsally by about 1.5 cm. No angulation. Fat fluid level in the joint. No fracture of the proximal tibia or fibula identified. IMPRESSION: Comminuted fracture distal femoral metaphysis above the femoral arthroplasty component. Electronically Signed   By: Paulina Fusi M.D.   On: 08/26/2018 10:02   Dg Femur Min 2 Views Left  Result Date: 08/26/2018 CLINICAL DATA:  Fall. Pain and deformity. EXAM: LEFT FEMUR 2 VIEWS COMPARISON:  None. FINDINGS: No proximal injury. As noted on the knee exam, there is a comminuted fracture of the distal femoral metaphysis above the femoral arthroplasty component. Main distal fragment is displaced dorsally about 1.5 cm. Fat fluid level in the knee joint. IMPRESSION: Comminuted displaced fracture of the distal femoral metaphysis above the femoral arthroplasty component. Electronically Signed  By: Paulina FusiMark  Shogry M.D.   On: 08/26/2018 10:03    Wt Readings from Last 3 Encounters:  08/26/18 126.1 kg  06/24/18 132.4 kg  06/01/18 (!) 137.2 kg    EKG: Sinus rhythm with first-degree AV block.  Interventricular conduction delay  Physical Exam:  Blood pressure (!) 108/50, pulse 71, temperature 99.3 F (37.4 C), temperature source Oral, resp. rate (!) 22, height 5\' 10"  (1.778 m), weight 126.1 kg,  SpO2 96 %. Body mass index is 39.89 kg/m. General: Well developed, well nourished, in no acute distress. Head: Normocephalic, atraumatic, sclera non-icteric, no xanthomas, nares are without discharge.  Neck: Negative for carotid bruits. JVD not elevated. Lungs: Clear bilaterally to auscultation without wheezes, rales, or rhonchi. Breathing is unlabored. Heart: RRR with S1 S2. No murmurs, rubs, or gallops appreciated. Abdomen: Soft, non-tender, non-distended with normoactive bowel sounds. No hepatomegaly. No rebound/guarding. No obvious abdominal masses. Msk:  Strength and tone appear normal for age. Extremities: No clubbing or cyanosis. No edema.  Distal pedal pulses are 2+ and equal bilaterally. Neuro: Alert and oriented X 3. No facial asymmetry. No focal deficit. Moves all extremities spontaneously. Psych:  Responds to questions appropriately with a normal affect.     Assessment and Plan  82 year old male with history of ischemic cardiomyopathy EF 30 to 35% with recent STEMI in October of this year with resultant treatment of a complex RCA lesion with 2 drug-eluting stents.  There is mild disease but noncritical disease in the LAD and circumflex not indicated for PCI.  He now is admitted with a femur fracture which will require fixation.  He is 3 months post inferior IMI.  Has been on aspirin and Plavix for dual antiplatelet therapy since his STEMI.  Ideal recommendations would to be to remain on dual antiplatelet therapy for at least 6 to 12 months by ACS recommendation.  Should surgery be urgently indicated, would recommend holding Plavix for shorter period of time as possible.  Patient was taken off on admission.  Would resume as soon as possible post procedure.  Given EF of 30 to 35% will need to limit fluid resuscitation during surgery.  Careful management of I's and O's would be recommended.  No further diagnostic or interventional procedures would reduce risk.  Patient is at moderate to high  risk given aforementioned cardiovascular issues however appears optimized as best as possible currently.  Signed, Dalia HeadingKenneth A Fath MD 08/26/2018, 6:07 PM Pager: [phone]

## 2018-08-26 NOTE — ED Triage Notes (Signed)
Pt presents to ED via [facility] with c/o increasing weakness from home. Per EMS pt was dx with R foot infection, has started abx for that. Per EMS pt had 2 falls yesterday, bruising noted to R eyebrow, pt is on Plavix and Entresto for blood thinners. Pt c/o L leg pain after fall. Pt also c/o urinary retention, pt states last urination was approx 8 hrs ago.

## 2018-08-26 NOTE — Progress Notes (Signed)
Pt admitted to room 142 from ED. Pt alert and oriented X4. No complaints of pain. Foley in place, sacral foam applied, low bed in place. Bed in lowest position call bell in reach bed alarm on.

## 2018-08-26 NOTE — ED Provider Notes (Signed)
[facility] Emergency Department Provider Note   ____________________________________________   First MD Initiated Contact with Patient 08/26/18 0818     (approximate)  I have reviewed the triage vital signs and the nursing notes.   HISTORY  Chief Complaint Weakness   HPI Dylan [name] is a 82 y.o. male history of congestive heart failure, coronary disease COPD  Patient reports that he started having some weakness over the last few days, he is also had a couple falls.  Denies any serious injuries.  He did however bump his head during 1 fall that occurred last night, and also he had a small cut that home health nurse has been managing on his ankle but it has not had any redness.  Denies any pain in the right leg.  But he also reports his left leg is been swollen since falling on it yesterday, pain is located in his left thigh and also his left knee seems swollen with that.  Additionally this morning and last night he had trouble urinating, he reports that this never happened before and usually he has the opposite problem where he has trouble holding his urine in.  He is not been able to urinate for about 8 hours.  Denies back pain.  No numbness or tingling.    Past Medical History:  Diagnosis Date  . CAD (coronary artery disease)   . Cervicalgia   . CHF (congestive heart failure) (HCC)   . COPD (chronic obstructive pulmonary disease) (HCC)   . Diastolic heart failure (HCC)   . Foot drop, right   . History of kidney stones   . Hyperlipidemia    unspecified  . Hypertension   . Myocardial infarction (HCC)   . Osteoarthritis   . Shoulder pain, left   . Sleep apnea   . Tremor, essential     Patient Active Problem List   Diagnosis Date Noted  . Periprosthetic fracture around internal prosthetic left knee joint 08/26/2018  . Acute ST elevation myocardial infarction (STEMI) of inferior wall (HCC) 06/21/2018  . S/P total knee arthroplasty 06/01/2018    . Primary osteoarthritis of left knee 02/05/2018  . Sepsis (HCC) 01/10/2018  . Pressure injury of skin 12/02/2017  . Community acquired pneumonia 12/01/2017  . Pneumonia 12/01/2017  . Hypertension 09/23/2017  . Hyperlipidemia 09/23/2017  . Hematoma of leg, right, subsequent encounter 09/22/2017  . Blood blister 09/22/2017  . Weakness 08/31/2017  . Benign essential tremor 10/18/2016  . Class 3 severe obesity due to excess calories with serious comorbidity and body mass index (BMI) of 45.0 to 49.9 in adult (HCC) 10/18/2016  . Varicose veins of lower extremities with ulcer (HCC) 06/17/2016  . Chronic venous insufficiency 06/17/2016  . Lymphedema 06/17/2016  . Acute on chronic diastolic CHF (congestive heart failure) (HCC) 06/10/2016  . COPD, severe (HCC) 05/24/2016  . AV block, Mobitz 1 02/09/2016  . Coronary atherosclerosis of native coronary artery 02/09/2016  . ST elevation MI (STEMI) (HCC) 02/08/2016  . CHF (congestive heart failure) (HCC) 12/15/2015  . Right foot drop 04/07/2014  . Rotator cuff rupture, complete 04/22/2013  . Neck pain 04/14/2013    Past Surgical History:  Procedure Laterality Date  . APPLICATION OF WOUND VAC Right 11/13/2017   Procedure: APPLICATION OF WOUND VAC;  Surgeon: Annice Needyew, Jason S, MD;  Location: [facility] ORS;  Service: Vascular;  Laterality: Right;  . BACK SURGERY  06/2010  . CARDIAC CATHETERIZATION Left 04/30/2016   Procedure: Left Heart Cath and Coronary  Angiography;  Surgeon: Laurier NancyShaukat A Khan, MD;  Location: [facility] INVASIVE CV LAB;  Service: Cardiovascular;  Laterality: Left;  . COLONOSCOPY    . CORONARY ANGIOPLASTY    . CORONARY/GRAFT ACUTE MI REVASCULARIZATION N/A 06/21/2018   Procedure: Coronary/Graft Acute MI Revascularization;  Surgeon: Iran OuchArida, Muhammad A, MD;  Location: [facility] INVASIVE CV LAB;  Service: Cardiovascular;  Laterality: N/A;  . KNEE ARTHROPLASTY Left 06/01/2018   Procedure: COMPUTER ASSISTED TOTAL KNEE ARTHROPLASTY;  Surgeon: Donato HeinzHooten, James P,  MD;  Location: [facility] ORS;  Service: Orthopedics;  Laterality: Left;  . KNEE ARTHROSCOPY Left   . KNEE SURGERY Left 1998  . LEFT HEART CATH AND CORONARY ANGIOGRAPHY N/A 06/21/2018   Procedure: LEFT HEART CATH AND CORONARY ANGIOGRAPHY;  Surgeon: Iran OuchArida, Muhammad A, MD;  Location: [facility] INVASIVE CV LAB;  Service: Cardiovascular;  Laterality: N/A;  . TONSILLECTOMY    . WOUND DEBRIDEMENT Right 11/13/2017   Procedure: DEBRIDEMENT WOUND;  Surgeon: Annice Needyew, Jason S, MD;  Location: [facility] ORS;  Service: Vascular;  Laterality: Right;    Prior to Admission medications   Medication Sig Start Date End Date Taking? Authorizing Provider  acetaminophen (TYLENOL) 500 MG tablet Take 1,000 mg by mouth 2 (two) times daily as needed for moderate pain.   Yes [provider]  Ascorbic Acid (VITAMIN C PO) Take 1 tablet by mouth daily.   Yes [provider]  aspirin EC 81 MG tablet Take 81 mg by mouth daily.   Yes [provider]  atorvastatin (LIPITOR) 80 MG tablet Take 1 tablet (80 mg total) by mouth daily. Patient taking differently: Take 40 mg by mouth daily.  06/24/18 06/24/19 Yes Pyreddy, Vivien RotaPavan, MD  carvedilol (COREG) 6.25 MG tablet Take 1 tablet (6.25 mg total) by mouth 2 (two) times daily with a meal. 06/24/18 08/26/18 Yes Pyreddy, Vivien RotaPavan, MD  clopidogrel (PLAVIX) 75 MG tablet Take 75 mg by mouth daily.   Yes [provider]  dextromethorphan-guaiFENesin (MUCINEX DM) 30-600 MG 12hr tablet Take 1 tablet by mouth 2 (two) times daily as needed for cough.   Yes [provider]  docusate sodium (COLACE) 100 MG capsule Take 300 mg by mouth at bedtime.    Yes [provider]  gabapentin (NEURONTIN) 400 MG capsule Take 400 mg by mouth at bedtime.   Yes [provider]  ipratropium-albuterol (DUONEB) 0.5-2.5 (3) MG/3ML SOLN Take 3 mLs by nebulization every 6 (six) hours as needed (shortness of breath).    Yes [provider]  isosorbide mononitrate (IMDUR) 30  MG 24 hr tablet Take 30 mg by mouth daily.  05/31/16  Yes [provider]  ketotifen (ZADITOR) 0.025 % ophthalmic solution Place 1 drop into both eyes 2 (two) times daily. 01/14/18  Yes Wieting, Richard, MD  lisinopril (PRINIVIL,ZESTRIL) 5 MG tablet Take 5 mg by mouth daily.  03/28/18  Yes [provider]  loratadine (CLARITIN) 10 MG tablet Take 10 mg by mouth daily as needed for allergies.   Yes [provider]  Multiple Vitamins-Minerals (CENTROVITE) TABS Take 1 tablet by mouth daily.   Yes [provider]  nitroGLYCERIN (NITROSTAT) 0.3 MG SL tablet Place 0.3 mg under the tongue every 5 (five) minutes as needed for chest pain.   Yes [provider]  potassium chloride (K-DUR,KLOR-CON) 10 MEQ tablet Take 10 mEq by mouth daily.    Yes [provider]  primidone (MYSOLINE) 50 MG tablet Take 200 mg by mouth 2 (two) times daily.    Yes [provider]  sacubitril-valsartan (ENTRESTO) 24-26 MG Take 1 tablet by mouth 2 (two) times daily.   Yes [provider]  spironolactone (ALDACTONE) 25 MG tablet Take 0.5 tablets (12.5 mg total) by mouth daily. 06/25/18 08/26/18 Yes Pyreddy, Vivien Rota, MD  torsemide (DEMADEX) 20 MG tablet Take 20 mg by mouth daily.    Yes [provider]  umeclidinium-vilanterol (ANORO ELLIPTA) 62.5-25 MCG/INH AEPB Inhale 1 puff into the lungs daily. 09/04/17  Yes Shaune Pollack, MD    Allergies Patient has no known allergies.  Family History  Problem Relation Age of Onset  . Diabetes Son   . Cancer Mother   . Colon cancer Mother   . Lung cancer Mother   . Tremor Father   . Heart disease Father   . Tremor Brother   . Bladder Cancer Brother   . Tremor Sister     Social History Social History   Tobacco Use  . Smoking status: Former Smoker    Packs/day: 0.25    Years: 58.00    Pack years: 14.50    Types: Cigarettes  . Smokeless tobacco: Former Neurosurgeon  . Tobacco comment: quit the begginning of  septembet 2019  Substance Use Topics  . Alcohol use: No    Alcohol/week: 0.0 standard drinks  . Drug use: No    Review of Systems Constitutional: No fever/chills feeling a bit more fatigued than normal Eyes: No visual changes. ENT: No sore throat. Cardiovascular: Denies chest pain. Respiratory: Denies shortness of breath. Gastrointestinal: No abdominal pain.  Does however report it feels like he needs to go to the bathroom but is unable to urinate. Genitourinary: Negative for dysuria.  Also reports of that he has not urinated for about 8 hours. Musculoskeletal: Negative for back pain.  Pain in the left hip and left thigh since falling yesterday. Skin: Negative for rash. Neurological: Negative for headaches, areas of focal weakness or numbness.  Did strike his head when he fell yesterday.  No neck pain.    ____________________________________________   PHYSICAL EXAM:  VITAL SIGNS: ED Triage Vitals  Enc Vitals Group     BP 08/26/18 0813 125/62     Pulse Rate 08/26/18 0813 62     Resp 08/26/18 0813 (!) 22     Temp 08/26/18 0813 98.4 F (36.9 C)     Temp Source 08/26/18 0813 Oral     SpO2 08/26/18 0810 100 %     Weight 08/26/18 0810 278 lb (126.1 kg)     Height 08/26/18 0810 5\' 10"  (1.778 m)     Head Circumference --      Peak Flow --      Pain Score 08/26/18 0810 8     Pain Loc --      Pain Edu? --      Excl. in GC? --     Constitutional: Alert and oriented.  Mildly ill-appearing but in no distress.  He is very pleasant, but just appears generally fatigued. Eyes: Conjunctivae are normal. Head: Atraumatic. Nose: No congestion/rhinnorhea. Mouth/Throat: Mucous membranes are moist. Neck: No stridor.  Cardiovascular: Normal rate, regular rhythm. Grossly normal heart sounds.  Good peripheral circulation. Respiratory: Normal respiratory effort.  No retractions. Lungs CTAB. Gastrointestinal: Soft and nontender. No distention.  Knows reports is uncomfortable palpate  suprapubically.  Morbidly obese abdomen. Musculoskeletal: Moves all upper extremities well without any difficulty or pain.  Right lower extremity full range of motion without pain or discomfort.  No bruising or injury to the right lower extremity noted.  Left lower extremity some discomfort and pain to palpation especially of the anterior knee joint with some slight to moderate edema overlying the anterior knee joint.  Also some tenderness lateral to the left hip joint.  No obvious shortening or deformity.  No open cuts or wounds except for a small what appears to be well-healing skin tear over the left lower ankle that does not appear superinfected.  He has normal capillary refill in both lower extremities bilateral.  Able to wiggle the toes well in both lower extremities.  He does have one toe on the left lower extremity that appears slightly necrotic but is black and does not exhibit any purulent drainage.  Reports that he had infection in that toe, home health is been monitoring it, he was in antibiotics and the infection is improving involving that toe. Neurologic:  Normal speech and language. No gross focal neurologic deficits are appreciated.  Skin:  Skin is warm, dry and intact. No rash noted. Psychiatric: Mood and affect are normal. Speech and behavior are normal.  ____________________________________________   LABS (all labs ordered are listed, but only abnormal results are displayed)  Labs Reviewed  COMPREHENSIVE METABOLIC PANEL - Abnormal; Notable for the following components:      Result Value   Glucose, Bld 136 (*)    BUN 38 (*)    Creatinine, Ser 1.95 (*)    Calcium 8.2 (*)    Total Protein 5.7 (*)    Albumin 3.0 (*)    GFR calc non Af Amer 32 (*)    GFR calc Af Amer 37 (*)    All other components within normal limits  CBC WITH DIFFERENTIAL/PLATELET - Abnormal; Notable for the following components:   RBC 3.26 (*)    Hemoglobin 10.2 (*)    HCT 32.5 (*)    RDW 16.0 (*)     Platelets 136 (*)    All other components within normal limits  TROPONIN I - Abnormal; Notable for the following components:   Troponin I 0.07 (*)    All other components within normal limits  URINE CULTURE  URINALYSIS, COMPLETE (UACMP) WITH MICROSCOPIC   ____________________________________________  EKG  Reviewed enterotomy at 8:15 AM Heart rate 69 QRS 140 QTc 480 Probably junctional rhythm.  Some nonspecific T wave normality's but nothing that appears frankly ischemic. ____________________________________________  RADIOLOGY  Dg Pelvis 1-2 Views  Result Date: 08/26/2018 CLINICAL DATA:  Fall.  Left-sided pain. EXAM: PELVIS - 1-2 VIEW COMPARISON:  None. FINDINGS: There is no evidence of pelvic fracture or diastasis. No pelvic bone lesions are seen. IMPRESSION: Negative. Electronically Signed   By: Paulina Fusi M.D.   On: 08/26/2018 10:03   Ct Head Wo Contrast  Result Date: 08/26/2018 CLINICAL DATA:  Worsening generalized weakness anti coagulated. Falling. EXAM: CT HEAD WITHOUT CONTRAST TECHNIQUE: Contiguous axial images were obtained from the base of the skull through the vertex without intravenous contrast. COMPARISON:  None. FINDINGS: Brain: Generalized atrophy. Extensive chronic small-vessel ischemic changes of the cerebral hemispheric white matter. Old infarction in right basal ganglia. No sign of acute infarction, mass lesion, hemorrhage, hydrocephalus or extra-axial collection. Vascular: There is atherosclerotic calcification of the major vessels at the base of the brain. Skull: Normal Sinuses/Orbits: Clear/normal Other: None IMPRESSION: No acute finding by CT. Atrophy and extensive chronic small-vessel ischemic changes. 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 Electronically Signed   By: Paulina Fusi M.D.   On: 08/26/2018 09:28   Dg Lumbar Spine 1 View  Result Date: 08/26/2018  CLINICAL DATA:  Fall. Leg pain. EXAM: LUMBAR SPINE - 1 VIEW COMPARISON:  08/05/2016 FINDINGS: Single AP  view shows normal spinal alignment. Can not accurately assess for lumbar compression fracture without a lateral projection. IMPRESSION: Single AP view of the lumbar spine shows normal spinal alignment. See above discussion. Electronically Signed   By: Paulina Fusi M.D.   On: 08/26/2018 10:04   Dg Knee Complete 4 Views Left  Result Date: 08/26/2018 CLINICAL DATA:  Pain after a fall. EXAM: LEFT KNEE - COMPLETE 4+ VIEW COMPARISON:  06/01/2018 FINDINGS: Comminuted fracture of the distal femoral metaphysis above the femoral arthroplasty component. Main distal fragment displaced dorsally by about 1.5 cm. No angulation. Fat fluid level in the joint. No fracture of the proximal tibia or fibula identified. IMPRESSION: Comminuted fracture distal femoral metaphysis above the femoral arthroplasty component. Electronically Signed   By: Paulina Fusi M.D.   On: 08/26/2018 10:02   Dg Femur Min 2 Views Left  Result Date: 08/26/2018 CLINICAL DATA:  Fall. Pain and deformity. EXAM: LEFT FEMUR 2 VIEWS COMPARISON:  None. FINDINGS: No proximal injury. As noted on the knee exam, there is a comminuted fracture of the distal femoral metaphysis above the femoral arthroplasty component. Main distal fragment is displaced dorsally about 1.5 cm. Fat fluid level in the knee joint. IMPRESSION: Comminuted displaced fracture of the distal femoral metaphysis above the femoral arthroplasty component. Electronically Signed   By: Paulina Fusi M.D.   On: 08/26/2018 10:03   CT head without contrast no acute findings.  Imaging studies reviewed by me.  Discussed with Dr. Rosita Kea. ____________________________________________   PROCEDURES  Procedure(s) performed: None  Procedures  Critical Care performed: No  ____________________________________________   INITIAL IMPRESSION / ASSESSMENT AND PLAN / ED COURSE  Pertinent labs & imaging results that were available during my care of the patient were reviewed by me and considered in my  medical decision making (see chart for details).   Patient presents for evaluation of increasing fatigue with 2 falls in the last day.  Concern for notable pain in his left thigh and left knee which she has had recently repaired  After review of imaging, appears the patient has a displaced fracture of the distal femoral metaphysis of the left.  Discussed with Dr. Rosita Kea, orthopedics will admit the patient.  Originally discussed with the hospitalist but they advised orthopedic admission on this case.  Also discussed with Dr. Rosita Kea that the patient has a slightly elevated troponin, increased fatigue.  Patient also having some mild acute on chronic renal insufficiency.      ____________________________________________   FINAL CLINICAL IMPRESSION(S) / ED DIAGNOSES  Final diagnoses:  AKI (acute kidney injury) (HCC)  Closed fracture of left femur, unspecified fracture morphology, unspecified portion of femur, initial encounter (HCC)  Fall, initial encounter        Note:  This document was prepared using Conservation officer, historic buildings and may include unintentional dictation errors       Sharyn Creamer, MD 08/26/18 1145

## 2018-08-26 NOTE — ED Notes (Signed)
Wound care provided by this RN. Skin tears noted to R hand and L forearm. Non-adhesive gauze and telfa applied by this RN.

## 2018-08-26 NOTE — ED Notes (Signed)
EDP at bedside  

## 2018-08-26 NOTE — Consult Note (Signed)
Patient Demographics  Dylan [name], is a 82 y.o. male   MRN: [id number]   DOB - [birth year]  Admit Date - 08/26/2018    Outpatient Primary MD for the patient is [name], MD  Consult requested in the Hospital by Kennedy Bucker, MD, On 08/26/2018    Reason for consult ; medical management HPI: 82 year old male patient with history of CAD, recent stent in October 2019 in RCA, sent because of fall, patient fell 2 times since yesterday, did have a right knee fracture.  Patient cardiac history significant for recent angioplasty with 2 overlapping DES drug-eluting stents in the right coronary artery, who follows up with Dr. Mathis Fare.  Patient denies any chest pain or shortness of breath.  Wife told me that since last night he could not urinate noted to have urine retention, Foley catheter inserted in the emergency room, noted to have concentrated urine in the Foley bag.  Patient also has right great toe ulcer and has been followed by home health nurse for dressing changes and according to the wife patient took antibiotics.  He noted to have chronic leg edema in both legs.  Complains of left knee pain.  Noted to have small abrasions on right side of the head and also right upper lip.   With History of -  Past Medical History:  Diagnosis Date  . CAD (coronary artery disease)   . Cervicalgia   . CHF (congestive heart failure) (HCC)   . COPD (chronic obstructive pulmonary disease) (HCC)   . Diastolic heart failure (HCC)   . Foot drop, right   . History of kidney stones   . Hyperlipidemia    unspecified  . Hypertension   . Myocardial infarction (HCC)   . Osteoarthritis   . Shoulder pain, left   . Sleep apnea   . Tremor, essential       Past Surgical History:  Procedure Laterality Date  . APPLICATION OF WOUND VAC Right  11/13/2017   Procedure: APPLICATION OF WOUND VAC;  Surgeon: Annice Needy, MD;  Location: [facility];  Service: Vascular;  Laterality: Right;  . BACK SURGERY  06/2010  . CARDIAC CATHETERIZATION Left 04/30/2016   Procedure: Left Heart Cath and Coronary Angiography;  Surgeon: Laurier Nancy, MD;  Location: [facility];  Service: Cardiovascular;  Laterality: Left;  . COLONOSCOPY    . CORONARY ANGIOPLASTY    . CORONARY/GRAFT ACUTE MI REVASCULARIZATION N/A 06/21/2018   Procedure: Coronary/Graft Acute MI Revascularization;  Surgeon: Iran Ouch, MD;  Location: [facility];  Service: Cardiovascular;  Laterality: N/A;  . KNEE ARTHROPLASTY Left 06/01/2018   Procedure: COMPUTER ASSISTED TOTAL KNEE ARTHROPLASTY;  Surgeon: Donato Heinz, MD;  Location: [facility];  Service: Orthopedics;  Laterality: Left;  . KNEE ARTHROSCOPY Left   . KNEE SURGERY Left 1998  . LEFT HEART CATH AND CORONARY ANGIOGRAPHY N/A 06/21/2018   Procedure: LEFT HEART CATH AND CORONARY ANGIOGRAPHY;  Surgeon: Iran Ouch, MD;  Location: [facility]  [facility];  Service: Cardiovascular;  Laterality: N/A;  . TONSILLECTOMY    . WOUND DEBRIDEMENT Right 11/13/2017   Procedure: DEBRIDEMENT WOUND;  Surgeon: Annice Needy, MD;  Location: [facility];  Service: Vascular;  Laterality: Right;    in for   Chief Complaint  Patient presents with  . Weakness        Review of Systems    In addition to the HPI above, No Fever-chills, No Headache, No changes with Vision or hearing, No problems swallowing food or Liquids, No Chest pain, Cough or Shortness of Breath, No Abdominal pain, No Nausea or Vommitting, Bowel movements are regular, No Blood in stool or Urine, No dysuria but has urine retention. No new skin rashes or bruises, No new joints pains-aches,  No new weakness, tingling, numbness in any extremity, No recent weight gain or loss, No polyuria, polydypsia or polyphagia, No significant Mental Stressors.  A  full 10 point Review of Systems was done, except as stated above, all other Review of Systems were negative.   Social History Social History   Tobacco Use  . Smoking status: Former Smoker    Packs/day: 0.25    Years: 58.00    Pack years: 14.50    Types: Cigarettes  . Smokeless tobacco: Former Neurosurgeon  . Tobacco comment: quit the begginning of septembet 2019  Substance Use Topics  . Alcohol use: No    Alcohol/week: 0.0 standard drinks     Family History Family History  Problem Relation Age of Onset  . Diabetes Son   . Cancer Mother   . Colon cancer Mother   . Lung cancer Mother   . Tremor Father   . Heart disease Father   . Tremor Brother   . Bladder Cancer Brother   . Tremor Sister      Prior to Admission medications   Medication Sig Start Date End Date Taking? Authorizing Provider  acetaminophen (TYLENOL) 500 MG tablet Take 1,000 mg by mouth 2 (two) times daily as needed for moderate pain.   Yes [provider]  Ascorbic Acid (VITAMIN C PO) Take 1 tablet by mouth daily.   Yes [provider]  aspirin EC 81 MG tablet Take 81 mg by mouth daily.   Yes [provider]  atorvastatin (LIPITOR) 80 MG tablet Take 1 tablet (80 mg total) by mouth daily. Patient taking differently: Take 40 mg by mouth daily.  06/24/18 06/24/19 Yes Pyreddy, Vivien Rota, MD  carvedilol (COREG) 6.25 MG tablet Take 1 tablet (6.25 mg total) by mouth 2 (two) times daily with a meal. 06/24/18 08/26/18 Yes Pyreddy, Vivien Rota, MD  clopidogrel (PLAVIX) 75 MG tablet Take 75 mg by mouth daily.   Yes [provider]  dextromethorphan-guaiFENesin (MUCINEX DM) 30-600 MG 12hr tablet Take 1 tablet by mouth 2 (two) times daily as needed for cough.   Yes [provider]  docusate sodium (COLACE) 100 MG capsule Take 300 mg by mouth at bedtime.    Yes [provider]  gabapentin (NEURONTIN) 400 MG capsule Take 400 mg by mouth at bedtime.   Yes [provider]   ipratropium-albuterol (DUONEB) 0.5-2.5 (3) MG/3ML SOLN Take 3 mLs by nebulization every 6 (six) hours as needed (shortness of breath).    Yes [provider]  isosorbide mononitrate (IMDUR) 30 MG 24 hr tablet Take 30 mg by mouth daily.  05/31/16  Yes [provider]  ketotifen (ZADITOR) 0.025 % ophthalmic solution Place 1 drop into both eyes 2 (two)  times daily. 01/14/18  Yes Wieting, Richard, MD  lisinopril (PRINIVIL,ZESTRIL) 5 MG tablet Take 5 mg by mouth daily.  03/28/18  Yes [provider]  loratadine (CLARITIN) 10 MG tablet Take 10 mg by mouth daily as needed for allergies.   Yes [provider]  Multiple Vitamins-Minerals (CENTROVITE) TABS Take 1 tablet by mouth daily.   Yes [provider]  nitroGLYCERIN (NITROSTAT) 0.3 MG SL tablet Place 0.3 mg under the tongue every 5 (five) minutes as needed for chest pain.   Yes [provider]  potassium chloride (K-DUR,KLOR-CON) 10 MEQ tablet Take 10 mEq by mouth daily.    Yes [provider]  primidone (MYSOLINE) 50 MG tablet Take 200 mg by mouth 2 (two) times daily.    Yes [provider]  sacubitril-valsartan (ENTRESTO) 24-26 MG Take 1 tablet by mouth 2 (two) times daily.   Yes [provider]  spironolactone (ALDACTONE) 25 MG tablet Take 0.5 tablets (12.5 mg total) by mouth daily. 06/25/18 08/26/18 Yes Pyreddy, Vivien RotaPavan, MD  torsemide (DEMADEX) 20 MG tablet Take 20 mg by mouth daily.    Yes [provider]  umeclidinium-vilanterol (ANORO ELLIPTA) 62.5-25 MCG/INH AEPB Inhale 1 puff into the lungs daily. 09/04/17  Yes Shaune Pollackhen, Qing, MD    Anti-infectives (From admission, onward)   Start     Dose/Rate Route Frequency Ordered Stop   08/27/18 1400  ceFAZolin (ANCEF) IVPB 2g/100 mL premix     2 g 200 mL/hr over 30 Minutes Intravenous  Once 08/26/18 1137        Scheduled Meds: . docusate sodium  100 mg Oral BID   Continuous Infusions: . sodium chloride    . [START  ON 08/27/2018]  ceFAZolin (ANCEF) IV    . methocarbamol (ROBAXIN) IV    . sodium chloride     PRN Meds:.bisacodyl, HYDROcodone-acetaminophen, magnesium citrate, magnesium hydroxide, methocarbamol **OR** methocarbamol (ROBAXIN) IV, morphine injection, zolpidem  No Known Allergies  Physical Exam  Vitals  Blood pressure 117/61, pulse 68, temperature 98.4 F (36.9 C), temperature source Oral, resp. rate 18, height 5\' 10"  (1.778 m), weight 126.1 kg, SpO2 96 %.   1. General alert,, awake, oriented lying in bed in NAD,   2. Normal affect and insight, Not Suicidal or Homicidal, Awake Alert, Oriented X 3.  3. No F.N deficits, ALL C.Nerves Intact, Strength 5/5 all 4 extremities, Sensation intact all 4 extremities, Plantars down going.  4. Ears and Eyes appear Normal, Conjunctivae clear, PERRLA. Moist Oral Mucosa.  5. Supple Neck, No JVD, No cervical lymphadenopathy appriciated, No Carotid Bruits.  6. Symmetrical Chest wall movement, Good air movement bilaterally, CTAB.  7. RRR, No Gallops, Rubs or Murmurs, No Parasternal Heave.  8. Positive Bowel Sounds, Abdomen Soft, No tenderness, No organomegaly appriciated,No rebound -guarding or rigidity.  9.  No Cyanosis, Normal Skin Turgor, No Skin Rash or Bruise.  10.  Noted to have left knee swelling, tenderness in the lateral to left hip.  No shortening.  Patient has chronic venous dermatitis.  No neurological deficit observed. 11.  Patient skin showed right great toe has necrotic area, I showed me the old pictures did have a right great toe infection with purulence.    Data Review  CBC Recent Labs  Lab 08/26/18 0821  WBC 9.7  HGB 10.2*  HCT 32.5*  PLT 136*  MCV 99.7  MCH 31.3  MCHC 31.4  RDW 16.0*  LYMPHSABS 0.9  MONOABS 0.6  EOSABS 0.4  BASOSABS 0.0   ------------------------------------------------------------------------------------------------------------------  Chemistries  Recent Labs  Lab 08/26/18 0821  NA 139  K  3.9  CL 103  CO2 28  GLUCOSE 136*  BUN 38*  CREATININE 1.95*  CALCIUM 8.2*  AST 38  ALT 37  ALKPHOS 73  BILITOT 0.9   ------------------------------------------------------------------------------------------------------------------ estimated creatinine clearance is 40.3 mL/min (A) (by C-G formula based on SCr of 1.95 mg/dL (H)). ------------------------------------------------------------------------------------------------------------------ No results for input(s): TSH, T4TOTAL, T3FREE, THYROIDAB in the last 72 hours.  Invalid input(s): FREET3   Coagulation profile No results for input(s): INR, PROTIME in the last 168 hours. ------------------------------------------------------------------------------------------------------------------- No results for input(s): DDIMER in the last 72 hours. -------------------------------------------------------------------------------------------------------------------  Cardiac Enzymes Recent Labs  Lab 08/26/18 0821  TROPONINI 0.07*   ------------------------------------------------------------------------------------------------------------------ Invalid input(s): POCBNP   ---------------------------------------------------------------------------------------------------------------  Urinalysis    Component Value Date/Time   COLORURINE YELLOW (A) 05/20/2018 1001   APPEARANCEUR CLEAR (A) 05/20/2018 1001   LABSPEC 1.018 05/20/2018 1001   PHURINE 6.0 05/20/2018 1001   GLUCOSEU NEGATIVE 05/20/2018 1001   HGBUR NEGATIVE 05/20/2018 1001   BILIRUBINUR NEGATIVE 05/20/2018 1001   KETONESUR NEGATIVE 05/20/2018 1001   PROTEINUR 100 (A) 05/20/2018 1001   UROBILINOGEN 0.2 06/07/2010 0426   NITRITE NEGATIVE 05/20/2018 1001   LEUKOCYTESUR NEGATIVE 05/20/2018 1001     Imaging results:   Dg Pelvis 1-2 Views  Result Date: 08/26/2018 CLINICAL DATA:  Fall.  Left-sided pain. EXAM: PELVIS - 1-2 VIEW COMPARISON:  None. FINDINGS: There is  no evidence of pelvic fracture or diastasis. No pelvic bone lesions are seen. IMPRESSION: Negative. Electronically Signed   By: Paulina FusiMark  Shogry M.D.   On: 08/26/2018 10:03   Ct Head Wo Contrast  Result Date: 08/26/2018 CLINICAL DATA:  Worsening generalized weakness anti coagulated. Falling. EXAM: CT HEAD WITHOUT CONTRAST TECHNIQUE: Contiguous axial images were obtained from the base of the skull through the vertex without intravenous contrast. COMPARISON:  None. FINDINGS: Brain: Generalized atrophy. Extensive chronic small-vessel ischemic changes of the cerebral hemispheric white matter. Old infarction in right basal ganglia. No sign of acute infarction, mass lesion, hemorrhage, hydrocephalus or extra-axial collection. Vascular: There is atherosclerotic calcification of the major vessels at the base of the brain. Skull: Normal Sinuses/Orbits: Clear/normal Other: None IMPRESSION: No acute finding by CT. Atrophy and extensive chronic small-vessel ischemic changes. 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 Electronically Signed   By: Paulina FusiMark  Shogry M.D.   On: 08/26/2018 09:28   Dg Lumbar Spine 1 View  Result Date: 08/26/2018 CLINICAL DATA:  Fall. Leg pain. EXAM: LUMBAR SPINE - 1 VIEW COMPARISON:  08/05/2016 FINDINGS: Single AP view shows normal spinal alignment. Can not accurately assess for lumbar compression fracture without a lateral projection. IMPRESSION: Single AP view of the lumbar spine shows normal spinal alignment. See above discussion. Electronically Signed   By: Paulina FusiMark  Shogry M.D.   On: 08/26/2018 10:04   Dg Knee Complete 4 Views Left  Result Date: 08/26/2018 CLINICAL DATA:  Pain after a fall. EXAM: LEFT KNEE - COMPLETE 4+ VIEW COMPARISON:  06/01/2018 FINDINGS: Comminuted fracture of the distal femoral metaphysis above the femoral arthroplasty component. Main distal fragment displaced dorsally by about 1.5 cm. No angulation. Fat fluid level in the joint. No fracture of the proximal tibia or fibula  identified. IMPRESSION: Comminuted fracture distal femoral metaphysis above the femoral arthroplasty component. Electronically Signed   By: Paulina FusiMark  Shogry M.D.   On: 08/26/2018 10:02   Dg Femur Min 2 Views Left  Result Date: 08/26/2018 CLINICAL DATA:  Fall. Pain and deformity. EXAM: LEFT FEMUR  2 VIEWS COMPARISON:  None. FINDINGS: No proximal injury. As noted on the knee exam, there is a comminuted fracture of the distal femoral metaphysis above the femoral arthroplasty component. Main distal fragment is displaced dorsally about 1.5 cm. Fat fluid level in the knee joint. IMPRESSION: Comminuted displaced fracture of the distal femoral metaphysis above the femoral arthroplasty component. Electronically Signed   By: Paulina Fusi M.D.   On: 08/26/2018 10:03    EKG shows heart rate 69 bpm, nonspecific ST-T changes.  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  82 year old male patient with recurrent falls noted to have periprosthetic fracture of left knee: Admitted to orthopedic service.  Patient is at moderate risk for surgery because of recent cardiac stent, advanced age, acute on chronic renal failure, now has urine retention which is improved with Foley catheter that was placed in the emergency room.  #1 .coronary artery disease, recent stent placement, patient is on Plavix, Coreg, aspirin, statins, ACE inhibitors at home, re needs preop cardiac clearance, Dr. Gwendalyn Ege spoke to Dr. Lady Gary.   #2 acute on chronic renal failure likely due to retention, hold lisinopril, Entresto today. 3.  Chronic lymphedema of the legs: Patient is on Aldactone, torsemide at home but hold them today because of worsening renal failure. #4 CKD stage III: Baseline creatinine is around 1.4. 5.  History of COPD, no wheezing today.  Continue his Anoro Ellipta.  #6 balance problems, multiple falls: Wife wanted me to check the ears, both ears look clean without  evidence of infection. 7.  Right great toe infection, continue dressing changes, consult wound care.  Hold off on antibiotics for now. AM Labs Ordered, also please review Full Orders  Family Communication: Discussed with patient's wife.   Thank you for the consult, we will follow the patient with you in the Hospital.   Katha Hamming M.D on 08/26/2018 at 12:33 PM

## 2018-08-26 NOTE — Progress Notes (Signed)
PCR sent.

## 2018-08-26 NOTE — H&P (Signed)
Subjective:   Patient is a 82 y.o. male presents with severe left thigh pain. Onset of symptoms was abrupt starting 6 hours ago with unchanged course since that time. The pain is located distal femur. Patient describes the pain as sharp at times, continuous and rated as moderate and severe. Pain has been associated with 2 falls yesterday and today. Patient denies LOC, has had trouble with balance lately. Symptoms are aggravated by motion. Symptoms improve withholding still. Past history includes Left TKA in September, MI and stents in [place].  Previous studies include radiographs.  Patient Active Problem List   Diagnosis Date Noted  . Periprosthetic fracture around internal prosthetic left knee joint 08/26/2018  . Acute ST elevation myocardial infarction (STEMI) of inferior wall (HCC) 06/21/2018  . S/P total knee arthroplasty 06/01/2018  . Primary osteoarthritis of left knee 02/05/2018  . Sepsis (HCC) 01/10/2018  . Pressure injury of skin 12/02/2017  . Community acquired pneumonia 12/01/2017  . Pneumonia 12/01/2017  . Hypertension 09/23/2017  . Hyperlipidemia 09/23/2017  . Hematoma of leg, right, subsequent encounter 09/22/2017  . Blood blister 09/22/2017  . Weakness 08/31/2017  . Benign essential tremor 10/18/2016  . Class 3 severe obesity due to excess calories with serious comorbidity and body mass index (BMI) of 45.0 to 49.9 in adult (HCC) 10/18/2016  . Varicose veins of lower extremities with ulcer (HCC) 06/17/2016  . Chronic venous insufficiency 06/17/2016  . Lymphedema 06/17/2016  . Acute on chronic diastolic CHF (congestive heart failure) (HCC) 06/10/2016  . COPD, severe (HCC) 05/24/2016  . AV block, Mobitz 1 02/09/2016  . Coronary atherosclerosis of native coronary artery 02/09/2016  . ST elevation MI (STEMI) (HCC) 02/08/2016  . CHF (congestive heart failure) (HCC) 12/15/2015  . Right foot drop 04/07/2014  . Rotator cuff rupture, complete 04/22/2013  . Neck pain 04/14/2013    Past Medical History:  Diagnosis Date  . CAD (coronary artery disease)   . Cervicalgia   . CHF (congestive heart failure) (HCC)   . COPD (chronic obstructive pulmonary disease) (HCC)   . Diastolic heart failure (HCC)   . Foot drop, right   . History of kidney stones   . Hyperlipidemia    unspecified  . Hypertension   . Myocardial infarction (HCC)   . Osteoarthritis   . Shoulder pain, left   . Sleep apnea   . Tremor, essential     Past Surgical History:  Procedure Laterality Date  . APPLICATION OF WOUND VAC Right 11/13/2017   Procedure: APPLICATION OF WOUND VAC;  Surgeon: Annice Needy, MD;  Location: [facility] ORS;  Service: Vascular;  Laterality: Right;  . BACK SURGERY  06/2010  . CARDIAC CATHETERIZATION Left 04/30/2016   Procedure: Left Heart Cath and Coronary Angiography;  Surgeon: Laurier Nancy, MD;  Location: [facility] INVASIVE CV [facility];  Service: Cardiovascular;  Laterality: Left;  . COLONOSCOPY    . CORONARY ANGIOPLASTY    . CORONARY/GRAFT ACUTE MI REVASCULARIZATION N/A 06/21/2018   Procedure: Coronary/Graft Acute MI Revascularization;  Surgeon: Iran Ouch, MD;  Location: [facility] INV[facility] CV [facility];  Service: Cardiovascular;  Laterality: N/A;  . KNEE ARTHROPLASTY Left 06/01/2018   Procedure: COMPUTER ASSISTED TOTAL KNEE ARTHROPLASTY;  Surgeon: Donato Heinz, MD;  Location: [facility] ORS;  Service: Orthopedics;  Laterality: Left;  . KNEE ARTHROSCOPY Left   . KNEE SURGERY Left 1998  . LEFT HEART CATH AND CORONARY ANGIOGRAPHY N/A 06/21/2018   Procedure: LEFT HEART CATH AND CORONARY ANGIOGRAPHY;  Surgeon: Iran Ouch, MD;  Location: [facility] CV [facility];  Service: Cardiovascular;  Laterality: N/A;  . TONSILLECTOMY    . WOUND DEBRIDEMENT Right 11/13/2017   Procedure: DEBRIDEMENT WOUND;  Surgeon: Annice Needyew, Jason S, MD;  Location: [facility] ORS;  Service: Vascular;  Laterality: Right;    Medications Prior to Admission  Medication Sig Dispense Refill Last Dose  . acetaminophen (TYLENOL) 500 MG  tablet Take 1,000 mg by mouth 2 (two) times daily as needed for moderate pain.   06/01/2018 at 0800  . Ascorbic Acid (VITAMIN C PO) Take 1 tablet by mouth daily.   05/31/2018 at Unknown time  . aspirin EC 81 MG tablet Take 81 mg by mouth daily.     Marland Kitchen. atorvastatin (LIPITOR) 80 MG tablet Take 1 tablet (80 mg total) by mouth daily. (Patient taking differently: Take 40 mg by mouth daily. ) 30 tablet 11   . carvedilol (COREG) 6.25 MG tablet Take 1 tablet (6.25 mg total) by mouth 2 (two) times daily with a meal. 60 tablet 0   . clopidogrel (PLAVIX) 75 MG tablet Take 75 mg by mouth daily.     Marland Kitchen. dextromethorphan-guaiFENesin (MUCINEX DM) 30-600 MG 12hr tablet Take 1 tablet by mouth 2 (two) times daily as needed for cough.     . docusate sodium (COLACE) 100 MG capsule Take 300 mg by mouth at bedtime.    05/31/2018 at Unknown time  . gabapentin (NEURONTIN) 400 MG capsule Take 400 mg by mouth at bedtime.   05/31/2018 at Unknown time  . ipratropium-albuterol (DUONEB) 0.5-2.5 (3) MG/3ML SOLN Take 3 mLs by nebulization every 6 (six) hours as needed (shortness of breath).    August 09, 202019  . isosorbide mononitrate (IMDUR) 30 MG 24 hr tablet Take 30 mg by mouth daily.    06/01/2018 at 0600  . ketotifen (ZADITOR) 0.025 % ophthalmic solution Place 1 drop into both eyes 2 (two) times daily. 5 mL 0 Taking  . lisinopril (PRINIVIL,ZESTRIL) 5 MG tablet Take 5 mg by mouth daily.    05/30/2018  . loratadine (CLARITIN) 10 MG tablet Take 10 mg by mouth daily as needed for allergies.   06/01/2018 at 0600e  . Multiple Vitamins-Minerals (CENTROVITE) TABS Take 1 tablet by mouth daily.   Taking  . nitroGLYCERIN (NITROSTAT) 0.3 MG SL tablet Place 0.3 mg under the tongue every 5 (five) minutes as needed for chest pain.   Taking  . potassium chloride (K-DUR,KLOR-CON) 10 MEQ tablet Take 10 mEq by mouth daily.    05/30/2018  . primidone (MYSOLINE) 50 MG tablet Take 200 mg by mouth 2 (two) times daily.    06/01/2018 at 0600  . sacubitril-valsartan  (ENTRESTO) 24-26 MG Take 1 tablet by mouth 2 (two) times daily.     Marland Kitchen. spironolactone (ALDACTONE) 25 MG tablet Take 0.5 tablets (12.5 mg total) by mouth daily. 15 tablet 0   . torsemide (DEMADEX) 20 MG tablet Take 20 mg by mouth daily.    05/31/2018 at Unknown time  . umeclidinium-vilanterol (ANORO ELLIPTA) 62.5-25 MCG/INH AEPB Inhale 1 puff into the lungs daily.   05/18/2018   No Known Allergies  Social History   Tobacco Use  . Smoking status: Former Smoker    Packs/day: 0.25    Years: 58.00    Pack years: 14.50    Types: Cigarettes  . Smokeless tobacco: Former NeurosurgeonUser  . Tobacco comment: quit the begginning of septembet 2019  Substance Use Topics  . Alcohol use: No    Alcohol/week: 0.0 standard drinks    Family  History  Problem Relation Age of Onset  . Diabetes Son   . Cancer Mother   . Colon cancer Mother   . Lung cancer Mother   . Tremor Father   . Heart disease Father   . Tremor Brother   . Bladder Cancer Brother   . Tremor Sister     Review of Systems Pertinent items are noted in HPI.  Objective:   Patient Vitals for the past 8 hrs:  BP Temp Temp src Pulse Resp SpO2  08/26/18 1940 (!) 126/55 99.8 F (37.7 C) Oral 77 20 99 %  08/26/18 1553 (!) 108/50 99.3 F (37.4 C) Oral 71 (!) 22 96 %  08/26/18 1302 (!) 125/52 98.2 F (36.8 C) Oral 73 18 100 %   I/O last 3 completed shifts: In: -  Out: 600 [Urine:600] No intake/output data recorded.    BP (!) 126/55 (BP Location: Right Arm)   Pulse 77   Temp 99.8 F (37.7 C) (Oral)   Resp 20   Ht 5\' 10"  (1.778 m)   Wt 126.1 kg   SpO2 99%   BMI 39.89 kg/m   General Appearance:    Alert, cooperative, no distress, appears stated age  Head:    Normocephalic, without obvious abnormality, bruising to right side of face and lip  Eyes:    PERRL, conjunctiva/corneas clear, EOM's intact, fundi    benign, both eyes       Ears:    Normal TM's and external ear canals, both ears        Neck:   Supple, symmetrical, trachea  midline, no adenopathy;       thyroid:  No enlargement/tenderness/nodules; no carotid   bruit or JVD  Back:     Symmetric, no curvature, ROM normal, no CVA tenderness  Lungs:     Clear to auscultation bilaterally, respirations unlabored  Chest wall:    No tenderness or deformity  Heart:    Regular rate and rhythm, S1 and S2 normal, no murmur, rub   or gallop  Abdomen:     Soft, non-tender, bowel sounds active all four quadrants,    no masses, no organomegaly        Extremities:   Extremities with swelling to left distal thigh, skin intact, healied anterior knee incision.  Small bruised areas to great toe on left with dressing on right foot.  Pulses:   1+ and symmetric all extremities  Skin:   Skin color, texture, turgor normal, no rashes or lesions  Lymph nodes:   Cervical, supraclavicular, and axillary nodes normal     Assessment:   Active Problems:   Periprosthetic fracture around internal prosthetic left knee joint   Plan:   ORIF distal femur after cardiology eval

## 2018-08-26 NOTE — ED Notes (Signed)
Attempted to call report x 1  

## 2018-08-26 NOTE — ED Notes (Signed)
Dr. Rosita KeaMenz at bedside at this time.

## 2018-08-26 NOTE — ED Notes (Signed)
Admitting MD at bedside.

## 2018-08-27 ENCOUNTER — Inpatient Hospital Stay: Payer: Medicare Other

## 2018-08-27 ENCOUNTER — Inpatient Hospital Stay: Payer: Medicare Other | Admitting: Anesthesiology

## 2018-08-27 ENCOUNTER — Encounter
Admission: EM | Disposition: A | Discharge status: Skilled Nursing Facility | Payer: Self-pay | Source: Home / Self Care | Attending: Internal Medicine

## 2018-08-27 HISTORY — PX: ORIF FEMUR FRACTURE: SHX2119

## 2018-08-27 LAB — BASIC METABOLIC PANEL
Anion gap: 9 (ref 5–15)
BUN: 44 mg/dL — ABNORMAL HIGH (ref 8–23)
CO2: 26 mmol/L (ref 22–32)
Calcium: 8.1 mg/dL — ABNORMAL LOW (ref 8.9–10.3)
Chloride: 104 mmol/L (ref 98–111)
Creatinine, Ser: 2.04 mg/dL — ABNORMAL HIGH (ref 0.61–1.24)
GFR calc Af Amer: 35 mL/min — ABNORMAL LOW (ref >60)
GFR, EST NON AFRICAN AMERICAN: 30 mL/min — AB (ref >60)
GLUCOSE: 129 mg/dL — AB (ref 70–99)
Potassium: 3.6 mmol/L (ref 3.5–5.1)
Sodium: 139 mmol/L (ref 135–145)

## 2018-08-27 LAB — CBC
HCT: 29.5 % — ABNORMAL LOW (ref 39.0–52.0)
Hemoglobin: 9.1 g/dL — ABNORMAL LOW (ref 13.0–17.0)
MCH: 30.7 pg (ref 26.0–34.0)
MCHC: 30.8 g/dL (ref 30.0–36.0)
MCV: 99.7 fL (ref 80.0–100.0)
PLATELETS: 132 10*3/uL — AB (ref 150–400)
RBC: 2.96 MIL/uL — ABNORMAL LOW (ref 4.22–5.81)
RDW: 16 % — ABNORMAL HIGH (ref 11.5–15.5)
WBC: 7.7 10*3/uL (ref 4.0–10.5)
nRBC: 0 % (ref 0.0–0.2)

## 2018-08-27 LAB — HEMOGLOBIN: Hemoglobin: 8.5 g/dL — ABNORMAL LOW (ref 13.0–17.0)

## 2018-08-27 SURGERY — OPEN REDUCTION INTERNAL FIXATION (ORIF) DISTAL FEMUR FRACTURE
Anesthesia: General | Site: Leg Upper | Laterality: Left

## 2018-08-27 MED ORDER — FENTANYL CITRATE (PF) 100 MCG/2ML IJ SOLN
INTRAMUSCULAR | Status: AC
  Administered 2018-08-27: 25 ug via INTRAVENOUS
  Filled 2018-08-27: qty 2

## 2018-08-27 MED ORDER — ONDANSETRON HCL 4 MG/2ML IJ SOLN
INTRAMUSCULAR | Status: AC
  Filled 2018-08-27: qty 2

## 2018-08-27 MED ORDER — NEOMYCIN-POLYMYXIN B GU 40-200000 IR SOLN
Status: DC | PRN
  Administered 2018-08-27: 2 mL

## 2018-08-27 MED ORDER — SUGAMMADEX SODIUM 200 MG/2ML IV SOLN
INTRAVENOUS | Status: DC | PRN
  Administered 2018-08-27: 250 mg via INTRAVENOUS

## 2018-08-27 MED ORDER — SUGAMMADEX SODIUM 500 MG/5ML IV SOLN
INTRAVENOUS | Status: AC
  Filled 2018-08-27: qty 5

## 2018-08-27 MED ORDER — ONDANSETRON HCL 4 MG/2ML IJ SOLN: 4.0000 mg | Freq: Once | INTRAMUSCULAR | Status: DC | PRN

## 2018-08-27 MED ORDER — ETOMIDATE 2 MG/ML IV SOLN
INTRAVENOUS | Status: AC
  Filled 2018-08-27: qty 10

## 2018-08-27 MED ORDER — PROPOFOL 10 MG/ML IV BOLUS
INTRAVENOUS | Status: AC
  Filled 2018-08-27: qty 20

## 2018-08-27 MED ORDER — DEXTROSE 5 % IV SOLN
INTRAVENOUS | Status: DC | PRN
  Administered 2018-08-27: 3 g via INTRAVENOUS

## 2018-08-27 MED ORDER — SUCCINYLCHOLINE CHLORIDE 20 MG/ML IJ SOLN
INTRAMUSCULAR | Status: DC | PRN
  Administered 2018-08-27: 100 mg via INTRAVENOUS

## 2018-08-27 MED ORDER — KETAMINE HCL 50 MG/ML IJ SOLN
INTRAMUSCULAR | Status: DC | PRN
  Administered 2018-08-27: 25 mg via INTRAMUSCULAR
  Administered 2018-08-27: 25 mg via INTRAVENOUS

## 2018-08-27 MED ORDER — ETOMIDATE 2 MG/ML IV SOLN
INTRAVENOUS | Status: DC | PRN
  Administered 2018-08-27: 14 mg via INTRAVENOUS

## 2018-08-27 MED ORDER — PHENOL 1.4 % MT LIQD
1.0000 | OROMUCOSAL | Status: DC | PRN
  Filled 2018-08-27: qty 177

## 2018-08-27 MED ORDER — SODIUM CHLORIDE 0.9 % IV SOLN
INTRAVENOUS | Status: DC
  Administered 2018-08-27 – 2018-08-28 (×2): via INTRAVENOUS

## 2018-08-27 MED ORDER — METOCLOPRAMIDE HCL 10 MG PO TABS: 5.0000 mg | ORAL_TABLET | Freq: Three times a day (TID) | ORAL | Status: DC | PRN

## 2018-08-27 MED ORDER — DEXAMETHASONE SODIUM PHOSPHATE 10 MG/ML IJ SOLN
INTRAMUSCULAR | Status: DC | PRN
  Administered 2018-08-27: 5 mg via INTRAVENOUS

## 2018-08-27 MED ORDER — ROCURONIUM BROMIDE 100 MG/10ML IV SOLN
INTRAVENOUS | Status: DC | PRN
  Administered 2018-08-27: 30 mg via INTRAVENOUS

## 2018-08-27 MED ORDER — PHENYLEPHRINE HCL 10 MG/ML IJ SOLN
INTRAMUSCULAR | Status: DC | PRN
  Administered 2018-08-27 (×2): 50 ug via INTRAVENOUS
  Administered 2018-08-27: 100 ug via INTRAVENOUS

## 2018-08-27 MED ORDER — ONDANSETRON HCL 4 MG/2ML IJ SOLN
INTRAMUSCULAR | Status: DC | PRN
  Administered 2018-08-27: 4 mg via INTRAVENOUS

## 2018-08-27 MED ORDER — FENTANYL CITRATE (PF) 100 MCG/2ML IJ SOLN
INTRAMUSCULAR | Status: AC
  Filled 2018-08-27: qty 2

## 2018-08-27 MED ORDER — LACTATED RINGERS IV SOLN
INTRAVENOUS | Status: DC
  Administered 2018-08-27: 13:00:00 via INTRAVENOUS

## 2018-08-27 MED ORDER — FENTANYL CITRATE (PF) 100 MCG/2ML IJ SOLN
INTRAMUSCULAR | Status: DC | PRN
  Administered 2018-08-27 (×3): 50 ug via INTRAVENOUS

## 2018-08-27 MED ORDER — MAGNESIUM HYDROXIDE 400 MG/5ML PO SUSP: 30.0000 mL | Freq: Every day | ORAL | Status: DC | PRN

## 2018-08-27 MED ORDER — DEXTROSE 5 % IV SOLN
3.0000 g | Freq: Four times a day (QID) | INTRAVENOUS | Status: AC
  Administered 2018-08-27 – 2018-08-28 (×3): 3 g via INTRAVENOUS
  Filled 2018-08-27 (×3): qty 3

## 2018-08-27 MED ORDER — LIDOCAINE HCL (PF) 2 % IJ SOLN
INTRAMUSCULAR | Status: AC
  Filled 2018-08-27: qty 10

## 2018-08-27 MED ORDER — ACETAMINOPHEN 10 MG/ML IV SOLN
INTRAVENOUS | Status: DC | PRN
  Administered 2018-08-27: 1000 mg via INTRAVENOUS

## 2018-08-27 MED ORDER — METOCLOPRAMIDE HCL 5 MG/ML IJ SOLN: 5.0000 mg | Freq: Three times a day (TID) | INTRAMUSCULAR | Status: DC | PRN

## 2018-08-27 MED ORDER — CEFAZOLIN SODIUM-DEXTROSE 2-4 GM/100ML-% IV SOLN
INTRAVENOUS | Status: AC
  Filled 2018-08-27: qty 100

## 2018-08-27 MED ORDER — PRIMIDONE 50 MG PO TABS
200.0000 mg | ORAL_TABLET | Freq: Two times a day (BID) | ORAL | Status: DC
  Administered 2018-08-27 – 2018-09-04 (×16): 200 mg via ORAL
  Filled 2018-08-27 (×18): qty 4

## 2018-08-27 MED ORDER — EPHEDRINE SULFATE 50 MG/ML IJ SOLN
INTRAMUSCULAR | Status: DC | PRN
  Administered 2018-08-27 (×4): 10 mg via INTRAVENOUS

## 2018-08-27 MED ORDER — ALUM & MAG HYDROXIDE-SIMETH 200-200-20 MG/5ML PO SUSP
30.0000 mL | ORAL | Status: DC | PRN
  Administered 2018-09-02 – 2018-09-04 (×4): 30 mL via ORAL
  Filled 2018-08-27 (×4): qty 30

## 2018-08-27 MED ORDER — BISACODYL 10 MG RE SUPP: 10.0000 mg | Freq: Every day | RECTAL | Status: DC | PRN

## 2018-08-27 MED ORDER — ONDANSETRON HCL 4 MG/2ML IJ SOLN: 4.0000 mg | Freq: Four times a day (QID) | INTRAMUSCULAR | Status: DC | PRN

## 2018-08-27 MED ORDER — LIDOCAINE HCL (CARDIAC) PF 100 MG/5ML IV SOSY
PREFILLED_SYRINGE | INTRAVENOUS | Status: DC | PRN
  Administered 2018-08-27: 25 mg via INTRAVENOUS

## 2018-08-27 MED ORDER — FENTANYL CITRATE (PF) 100 MCG/2ML IJ SOLN
25.0000 ug | INTRAMUSCULAR | Status: DC | PRN
  Administered 2018-08-27 (×2): 25 ug via INTRAVENOUS

## 2018-08-27 MED ORDER — DOCUSATE SODIUM 100 MG PO CAPS: 100.0000 mg | ORAL_CAPSULE | Freq: Two times a day (BID) | ORAL | Status: DC

## 2018-08-27 MED ORDER — ACETAMINOPHEN 10 MG/ML IV SOLN
INTRAVENOUS | Status: AC
  Filled 2018-08-27: qty 100

## 2018-08-27 MED ORDER — CEFAZOLIN SODIUM 1 G IJ SOLR
INTRAMUSCULAR | Status: AC
  Filled 2018-08-27: qty 10

## 2018-08-27 MED ORDER — ROCURONIUM BROMIDE 50 MG/5ML IV SOLN
INTRAVENOUS | Status: AC
  Filled 2018-08-27: qty 1

## 2018-08-27 MED ORDER — SUCCINYLCHOLINE CHLORIDE 20 MG/ML IJ SOLN
INTRAMUSCULAR | Status: AC
  Filled 2018-08-27: qty 1

## 2018-08-27 MED ORDER — DEXAMETHASONE SODIUM PHOSPHATE 10 MG/ML IJ SOLN
INTRAMUSCULAR | Status: AC
  Filled 2018-08-27: qty 1

## 2018-08-27 MED ORDER — ONDANSETRON HCL 4 MG PO TABS: 4.0000 mg | ORAL_TABLET | Freq: Four times a day (QID) | ORAL | Status: DC | PRN

## 2018-08-27 MED ORDER — MENTHOL 3 MG MT LOZG
1.0000 | LOZENGE | OROMUCOSAL | Status: DC | PRN
  Filled 2018-08-27: qty 9

## 2018-08-27 SURGICAL SUPPLY — 53 items
ANCHOR JUGGERKNOT WTAP NDL 2.9 (Anchor) ×6 IMPLANT
BIT DRILL 4.3 (BIT) ×2
BIT DRILL 4.3MM (BIT) ×1
BIT DRILL 4.3X300MM (BIT) ×1 IMPLANT
BIT DRILL JUGRKNT W/NDL BIT2.9 (DRILL) ×1 IMPLANT
BIT DRILL QC 3.3X195 (BIT) ×3 IMPLANT
CANISTER SUCT 1200ML W/VALVE (MISCELLANEOUS) ×3 IMPLANT
CAP LOCK NCB (Cap) ×18 IMPLANT
CHLORAPREP W/TINT 26ML (MISCELLANEOUS) ×3 IMPLANT
COVER WAND RF STERILE (DRAPES) ×3 IMPLANT
DRAPE C-ARM XRAY 36X54 (DRAPES) ×3 IMPLANT
DRAPE C-ARMOR (DRAPES) ×3 IMPLANT
DRILL JUGGERKNOT W/NDL BIT 2.9 (DRILL) ×3
ELECT REM PT RETURN 9FT ADLT (ELECTROSURGICAL) ×3
ELECTRODE REM PT RTRN 9FT ADLT (ELECTROSURGICAL) ×1 IMPLANT
GAUZE PETRO XEROFOAM 1X8 (MISCELLANEOUS) ×3 IMPLANT
GAUZE SPONGE 4X4 12PLY STRL (GAUZE/BANDAGES/DRESSINGS) ×3 IMPLANT
GLOVE BIOGEL PI IND STRL 9 (GLOVE) ×1 IMPLANT
GLOVE BIOGEL PI INDICATOR 9 (GLOVE) ×2
GLOVE SURG SYN 9.0  PF PI (GLOVE) ×2
GLOVE SURG SYN 9.0 PF PI (GLOVE) ×1 IMPLANT
GOWN SRG 2XL LVL 4 RGLN SLV (GOWNS) ×1 IMPLANT
GOWN STRL NON-REIN 2XL LVL4 (GOWNS) ×2
GOWN STRL REUS W/ TWL LRG LVL3 (GOWN DISPOSABLE) ×1 IMPLANT
GOWN STRL REUS W/TWL LRG LVL3 (GOWN DISPOSABLE) ×2
HEMOVAC 400ML (MISCELLANEOUS) ×3
K-WIRE 2.0 (WIRE) ×4
K-WIRE FXSTD 280X2XNS SS (WIRE) ×2
KIT DRAIN HEMOVAC JP 7FR 400ML (MISCELLANEOUS) ×1 IMPLANT
KIT TURNOVER KIT A (KITS) ×3 IMPLANT
KWIRE FXSTD 280X2XNS SS (WIRE) ×2 IMPLANT
MAT ABSORB  FLUID 56X50 GRAY (MISCELLANEOUS) ×2
MAT ABSORB FLUID 56X50 GRAY (MISCELLANEOUS) ×1 IMPLANT
NEEDLE FILTER BLUNT 18X 1/2SAF (NEEDLE) ×2
NEEDLE FILTER BLUNT 18X1 1/2 (NEEDLE) ×1 IMPLANT
NS IRRIG 500ML POUR BTL (IV SOLUTION) ×3 IMPLANT
PACK TOTAL KNEE (MISCELLANEOUS) ×3 IMPLANT
PLATE DIST FEM 12H (Plate) ×3 IMPLANT
SCALPEL PROTECTED #10 DISP (BLADE) ×6 IMPLANT
SCREW 5.0 60MM (Screw) ×6 IMPLANT
SCREW 5.0 70MM (Screw) ×6 IMPLANT
SCREW CORT NCB SELFTAP 5.0X42 (Screw) ×6 IMPLANT
SCREW CORTICAL NCB 5.0X40 (Screw) ×3 IMPLANT
SCREW CORTICAL NCB 5.0X44 (Screw) ×3 IMPLANT
SCREW NCB 3.5X75X5X6.2XST (Screw) ×1 IMPLANT
SCREW NCB 5.0X36MM (Screw) ×3 IMPLANT
SCREW NCB 5.0X75MM (Screw) ×2 IMPLANT
STAPLER SKIN PROX 35W (STAPLE) ×3 IMPLANT
SUT VIC AB 0 CT1 36 (SUTURE) ×6 IMPLANT
SUT VIC AB 2-0 CT1 27 (SUTURE) ×4
SUT VIC AB 2-0 CT1 TAPERPNT 27 (SUTURE) ×2 IMPLANT
SYR 5ML LL (SYRINGE) ×3 IMPLANT
TAPE MICROFOAM 4IN (TAPE) ×3 IMPLANT

## 2018-08-27 NOTE — Anesthesia Preprocedure Evaluation (Signed)
Anesthesia Evaluation  Patient identified by MRN, date of birth, ID band Patient awake    Reviewed: Allergy & Precautions, NPO status , Patient's Chart, lab work & pertinent test results  History of Anesthesia Complications Negative for: history of anesthetic complications  Airway Mallampati: III       Dental   Pulmonary sleep apnea and Continuous Positive Airway Pressure Ventilation , COPD,  COPD inhaler, Current Smoker,           Cardiovascular hypertension, + CAD, + Past MI, + Cardiac Stents and +CHF  (-) Valvular Problems/Murmurs     Neuro/Psych neg Seizures    GI/Hepatic Neg liver ROS, neg GERD  ,  Endo/Other  neg diabetes  Renal/GU negative Renal ROS     Musculoskeletal   Abdominal   Peds  Hematology   Anesthesia Other Findings   Reproductive/Obstetrics                             Anesthesia Physical Anesthesia Plan  ASA: III  Anesthesia Plan: General   Post-op Pain Management:    Induction: Intravenous  PONV Risk Score and Plan: 2  Airway Management Planned: Oral ETT  Additional Equipment:   Intra-op Plan:   Post-operative Plan:   Informed Consent: I have reviewed the patients History and Physical, chart, labs and discussed the procedure including the risks, benefits and alternatives for the proposed anesthesia with the patient or authorized representative who has indicated his/her understanding and acceptance.     Plan Discussed with:   Anesthesia Plan Comments:         Anesthesia Quick Evaluation

## 2018-08-27 NOTE — Consult Note (Signed)
WOC Nurse wound consult note [facility]  No family present. Reason for Consult: Right great toe wound Wound type: Trauma, patient states he bumped his toe some time ago while in a rehab center. Measurement: 1 cm x 1.2 cm x no measurable depth. Wound bed: 100% clean, pink. Drainage (amount, consistency, odor) none on existing foam dressing Periwound: Normal color skin, dry flaking.  Dressing procedure/placement/frequency: Apply a small piece of Xeroform gauze Hart [place]([facility] # [id number]) to the wound on the right great toe.  Cover with a foam pad.  Change every 3 days and prn. Additional care item:  SizeWise bariatric bed with air mattress.  The current bed does not provide adequate space for the patient to turn side to side. Monitor the wound area(s) for worsening of condition such as: Signs/symptoms of infection,  Increase in size,  Development of or worsening of odor, Development of pain, or increased pain at the affected locations.  Notify the medical team if any of these develop.  Thank you for the consult.  Discussed plan of care with the patient and bedside nurse.  WOC nurse will not follow at this time.  Please re-consult the WOC team if needed.  Helmut MusterSherry [place]oss, RN, MSN, CWOCN, CNS-BC, pager [phone]

## 2018-08-27 NOTE — Transfer of Care (Signed)
Immediate Anesthesia Transfer of Care Note  Patient: Dylan [name]  Procedure(s) Performed: OPEN REDUCTION INTERNAL FIXATION (ORIF) SUPRACONDYLAR FEMUR FRACTURE ABOVE PROSTHESIS, QUADRICEPS REPAIR (Left Leg Upper)  Patient Location: PACU  Anesthesia Type:General  Level of Consciousness: awake  Airway & Oxygen Therapy: Patient Spontanous Breathing and Patient connected to face mask oxygen  Post-op Assessment: Report given to RN and Post -op Vital signs reviewed and stable  Post vital signs: Reviewed  Last Vitals:  Vitals Value Taken Time  BP 124/68 08/27/2018  3:52 PM  Temp    Pulse 63 08/27/2018  3:52 PM  Resp 18 08/27/2018  3:52 PM  SpO2 100 % 08/27/2018  3:52 PM  Vitals shown include unvalidated device data.  Last Pain:  Vitals:   08/27/18 1235  TempSrc: Temporal  PainSc: 0-No pain      Patients Stated Pain Goal: 2 (08/26/18 2000)  Complications: No apparent anesthesia complications

## 2018-08-27 NOTE — Anesthesia Post-op Follow-up Note (Signed)
Anesthesia QCDR form completed.        

## 2018-08-27 NOTE — Care Management Note (Signed)
Case Management Note  Patient Details  Name: VALENTINO SAAVEDRA MRN: [id number] Date of Birth: 25-Aug-1936  Subjective/Objective:                  RNCM met with a patient and his son Randall Hiss to discuss discharge planning He expresses concern that he continues to lose independence. His son states that he has had increased falling over the last 2 years and "they can't figure out what is wrong with is him".  His PCP is Dr. Hall Busing.  He lives with his wife.  He has a wheelchair, bedside commode, lift chair, and rolling walker but has limited mobility.  He is currently followed by [facility] for home health. Action/Plan:   Helene Kelp with [facility] notified of patient presentation.  Home health agency list provided via [email or web address].    Expected Discharge Date:                  Expected Discharge Plan:     In-House Referral:     Discharge planning Services  CM Consult  Post Acute Care Choice:  Home Health, Resumption of Svcs/PTA Provider Choice offered to:  Patient, Adult Children  DME Arranged:    DME Agency:     HH Arranged:    [place] Agency:  [facility] [employer]  Status of Service:  In process, will continue to follow  If discussed at Long Length of Stay Meetings, dates discussed:    Additional Comments:  Marshell Garfinkel, RN 08/27/2018, 10:41 AM

## 2018-08-27 NOTE — Progress Notes (Signed)
[facility] [place] [facility]   PATIENT NAME: Dylan [name]    MR#:  [id number]  DATE OF BIRTH:  28-Jan-1936  SUBJECTIVE:   Patient having left hip/thigh pain  REVIEW OF SYSTEMS:    Review of Systems  Constitutional: Negative for fever, chills weight loss HENT: Negative for ear pain, nosebleeds, congestion, facial swelling, rhinorrhea, neck pain, neck stiffness and ear discharge.   Respiratory: Negative for cough, shortness of breath, wheezing  Cardiovascular: Negative for chest pain, palpitations and leg swelling.  Gastrointestinal: Negative for heartburn, abdominal pain, vomiting, diarrhea or consitpation Genitourinary: Negative for dysuria, urgency, frequency, hematuria Musculoskeletal: Negative for back pain ++ left hip/thigh /knee pain Neurological: Negative for dizziness, seizures, syncope, focal weakness,  numbness and headaches.  Hematological: Does not bruise/bleed easily.  Psychiatric/Behavioral: Negative for hallucinations, confusion, dysphoric mood    Tolerating Diet:*npo     DRUG ALLERGIES:  No Known Allergies  VITALS:  Blood pressure (!) 113/57, pulse 69, temperature (!) 97.5 F (36.4 C), temperature source Oral, resp. rate 18, height 5\' 10"  (1.778 m), weight 126.1 kg, SpO2 99 %.  PHYSICAL EXAMINATION:  Constitutional: Appears well-developed and well-nourished. No distress. HENT: Normocephalic. Marland Kitchen. Oropharynx is clear and moist.  Eyes: Conjunctivae and EOM are normal. PERRLA, no scleral icterus.  Neck: Normal ROM. Neck supple. No JVD. No tracheal deviation. CVS: RRR, S1/S2 +, no murmurs, no gallops, no carotid bruit.  Pulmonary: Effort and breath sounds normal, no stridor, rhonchi, wheezes, rales.  Abdominal: Soft. BS +,  no distension, tenderness, rebound or guarding.  Musculoskeletal: Extremities with swelling to left distal thigh, skin intact, healied anterior knee incision.  Small bruised areas to great toe on left with dressing on  right footNeuro: Alert. CN 2-12 grossly intact. No focal deficits. Skin: Skin is warm and dry. No rash noted. Psychiatric: Normal mood and affect.      LABORATORY PANEL:   CBC Recent Labs  Lab 08/27/18 0353  WBC 7.7  HGB 9.1*  HCT 29.5*  PLT 132*   ------------------------------------------------------------------------------------------------------------------  Chemistries  Recent Labs  Lab 08/26/18 0821 08/27/18 0353  NA 139 139  K 3.9 3.6  CL 103 104  CO2 28 26  GLUCOSE 136* 129*  BUN 38* 44*  CREATININE 1.95* 2.04*  CALCIUM 8.2* 8.1*  AST 38  --   ALT 37  --   ALKPHOS 73  --   BILITOT 0.9  --    ------------------------------------------------------------------------------------------------------------------  Cardiac Enzymes Recent Labs  Lab 08/26/18 0821  TROPONINI 0.07*   ------------------------------------------------------------------------------------------------------------------  RADIOLOGY:  Dg Pelvis 1-2 Views  Result Date: 08/26/2018 CLINICAL DATA:  Fall.  Left-sided pain. EXAM: PELVIS - 1-2 VIEW COMPARISON:  None. FINDINGS: There is no evidence of pelvic fracture or diastasis. No pelvic bone lesions are seen. IMPRESSION: Negative. Electronically Signed   By: Paulina FusiMark  Shogry M.D.   On: 08/26/2018 10:03   Ct Head Wo Contrast  Result Date: 08/26/2018 CLINICAL DATA:  Worsening generalized weakness anti coagulated. Falling. EXAM: CT HEAD WITHOUT CONTRAST TECHNIQUE: Contiguous axial images were obtained from the base of the skull through the vertex without intravenous contrast. COMPARISON:  None. FINDINGS: Brain: Generalized atrophy. Extensive chronic small-vessel ischemic changes of the cerebral hemispheric white matter. Old infarction in right basal ganglia. No sign of acute infarction, mass lesion, hemorrhage, hydrocephalus or extra-axial collection. Vascular: There is atherosclerotic calcification of the major vessels at the base of the brain. Skull:  Normal Sinuses/Orbits: Clear/normal Other: None IMPRESSION: No acute finding by CT. Atrophy  and extensive chronic small-vessel ischemic changes. 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 Electronically Signed   By: Paulina Fusi M.D.   On: 08/26/2018 09:28   Dg Lumbar Spine 1 View  Result Date: 08/26/2018 CLINICAL DATA:  Fall. Leg pain. EXAM: LUMBAR SPINE - 1 VIEW COMPARISON:  08/05/2016 FINDINGS: Single AP view shows normal spinal alignment. Can not accurately assess for lumbar compression fracture without a lateral projection. IMPRESSION: Single AP view of the lumbar spine shows normal spinal alignment. See above discussion. Electronically Signed   By: Paulina Fusi M.D.   On: 08/26/2018 10:04   Dg Chest Port 1 View  Result Date: 08/26/2018 CLINICAL DATA:  Left distal femoral periprosthetic fracture. Preoperative assessment. History of coronary artery disease. EXAM: PORTABLE CHEST 1 VIEW COMPARISON:  Chest radiograph 04/23/2018 FINDINGS: Atherosclerotic calcification of the aortic arch. Retrocardiac density with obscuration of the left hemidiaphragm. Mild enlargement of the cardiopericardial silhouette, without edema. IMPRESSION: 1. Retrocardiac airspace opacity with obscuration of the left costophrenic angle, not entirely specific but possibly from layering pleural effusion of passive atelectasis. 2.  Aortic Atherosclerosis (ICD10-I70.0). 3. Mild enlargement of the cardiopericardial silhouette. No appreciable edema. Electronically Signed   By: Gaylyn Rong M.D.   On: 08/26/2018 13:41   Dg Knee Complete 4 Views Left  Result Date: 08/26/2018 CLINICAL DATA:  Pain after a fall. EXAM: LEFT KNEE - COMPLETE 4+ VIEW COMPARISON:  06/01/2018 FINDINGS: Comminuted fracture of the distal femoral metaphysis above the femoral arthroplasty component. Main distal fragment displaced dorsally by about 1.5 cm. No angulation. Fat fluid level in the joint. No fracture of the proximal tibia or fibula identified.  IMPRESSION: Comminuted fracture distal femoral metaphysis above the femoral arthroplasty component. Electronically Signed   By: Paulina Fusi M.D.   On: 08/26/2018 10:02   Dg Femur Min 2 Views Left  Result Date: 08/26/2018 CLINICAL DATA:  Fall. Pain and deformity. EXAM: LEFT FEMUR 2 VIEWS COMPARISON:  None. FINDINGS: No proximal injury. As noted on the knee exam, there is a comminuted fracture of the distal femoral metaphysis above the femoral arthroplasty component. Main distal fragment is displaced dorsally about 1.5 cm. Fat fluid level in the knee joint. IMPRESSION: Comminuted displaced fracture of the distal femoral metaphysis above the femoral arthroplasty component. Electronically Signed   By: Paulina Fusi M.D.   On: 08/26/2018 10:03     ASSESSMENT AND PLAN:   82 year old male with history of CAD status post PCI of RCA x2 in October 2019 who presented to the emergency room after a fall with a fracture of left femur.   1.  Periprosthetic fracture around internal prosthetic left knee joint: Patient for ORIF distal femur today  Management as per orthopedic surgery.  2.  CAD: Patient evaluated by cardiology and cleared for surgery Continue dual antiplatelet therapy given history of STEMI Continue aspirin, statin, Coreg Start Plavix after surgery   3.  Chronic systolic heart failure ejection fraction 30 to 35% without signs of exacerbation Continue Coreg  4.  Essential hypertension: Continue isosorbide and Coreg  Management plans discussed with the patient and he is in agreement.  CODE STATUS: Full  TOTAL TIME TAKING CARE OF THIS PATIENT: 30 minutes.     POSSIBLE D/C 2 days, DEPENDING ON CLINICAL CONDITION.   Chyler Creely M.D on 08/27/2018 at 11:13 AM  Between 7am to 6pm - Pager - [phone] After 6pm go to [email or web address] - Social research officer, government  [facility]  Office  [phone]  CC: Primary care physician;  Jaclyn Shaggyate, [name], MD  Note: This dictation was  prepared with Dragon dictation along with smaller phrase technology. Any transcriptional errors that result from this process are unintentional.

## 2018-08-27 NOTE — Op Note (Signed)
08/27/2018  3:42 PM  PATIENT:  Dylan [name]  82 y.o. male  PRE-OPERATIVE DIAGNOSIS:  LEFT SUPRACONDYLAR FEMUR FRACTURE  POST-OPERATIVE DIAGNOSIS:  LEFT SUPRACONDYLAR FEMUR FRACTURE, QUADRACEPS RUPTURE  PROCEDURE:  Procedure(s): OPEN REDUCTION INTERNAL FIXATION (ORIF) SUPRACONDYLAR FEMUR FRACTURE ABOVE PROSTHESIS, QUADRICEPS REPAIR (Left)  SURGEON: Leitha SchullerMichael J Makoto Sellitto, MD  ASSISTANTS: None  ANESTHESIA:   general  EBL:  Total I/O In: 1027.4 [I.V.:1027.4] Out: 875 [Urine:525; Blood:350]  BLOOD ADMINISTERED:none  DRAINS: none   LOCAL MEDICATIONS USED:  NONE  SPECIMEN:  No Specimen  DISPOSITION OF SPECIMEN:  N/A  COUNTS:  YES  TOURNIQUET:  * No tourniquets in log *  IMPLANTS: Zimmer walking periprosthetic plate with multiple screws and locking nuts, juggernaut anchors x2  DICTATION: .Dragon Dictation patient was brought to the operating room and after adequate anesthesia was obtained the left leg was prepped and draped in the  usual  sterile fashion.  After patient identification and timeout procedure were completed, an anterior lateral approach to the knee was made with the IT band split in the distal femur exposed.  There is a large hematoma present within the joint and this was evacuated at this time it was there is a visible defect above the patella and there was a history of prior quadriceps  tendon rupture and repair remotely but this appeared to be a new quad rupture.  After keeping the leg in a slightly flexed position essentially anatomic alignment could be obtained with closed means and then the 12 hole Zimmer plate was slid subcu muscularly along the shaft held in position with K wires proximally and distally and the distal screws were filled first using the C arm to assess length and position with all distal screw holes filled in the supracondylar region.  Next 4 cortical screws were placed proximally going through the drill sleeves drilling measuring and placing the  nonlocking screws the shaft came to the plate very well and alignment appeared to be acceptable in both AP and lateral projections.  Locking nuts were then placed on each of the distal screws to provide for a more rigid construct distally.  With this the serum views showed acceptable position and so the wound was thoroughly irrigated with #1 Vicryl to close the IT band and joint followed by 2-0 Vicryl subcutaneously and skin staples.  The proximal incisions were just closed with skin staples as they were done percutaneously.  The prior anterior knee incision was then opened and there was clear rupture of the quad tendon centrally.  Very old suture anchors were noted and removed.  A fiber tape was then placed circumferentially around the patella and then woven through the quadriceps distally and tightened to give what felt like anatomic repair.  Additionally 2 juggernaut anchors were placed in the proximal pole of the patella and those sutures used to reinforce the repair with the knee stable to 45 degrees flexion with the quadriceps be feeling intact at this point.  The wound was then thoroughly irrigated and closed with 2-0 Vicryl and skin staples followed by a Provena wound VAC as this was fairly recent scar. The remaining wounds were dressed with Xeroform 4 x 4's ABD web roll bias wrap and then the knee held in a knee immobilizer.  PLAN OF CARE: Continue as inpatient  PATIENT DISPOSITION:  PACU - hemodynamically stable.

## 2018-08-27 NOTE — Anesthesia Procedure Notes (Signed)
Procedure Name: Intubation Performed by: Agam Davenport, CRNA Pre-anesthesia Checklist: Patient identified, Patient being monitored, Timeout performed, Emergency Drugs available and Suction available Patient Re-evaluated:Patient Re-evaluated prior to induction Oxygen Delivery Method: Circle system utilized Preoxygenation: Pre-oxygenation with 100% oxygen Induction Type: IV induction and Rapid sequence Laryngoscope Size: Miller and 2 Grade View: Grade I Tube type: Oral Tube size: 7.5 mm Number of attempts: 1 Placement Confirmation: ETT inserted through vocal cords under direct vision,  positive ETCO2 and breath sounds checked- equal and bilateral Secured at: 23 cm Tube secured with: Tape Dental Injury: Teeth and Oropharynx as per pre-operative assessment        

## 2018-08-28 ENCOUNTER — Encounter: Payer: Self-pay | Admitting: Orthopedic Surgery

## 2018-08-28 ENCOUNTER — Encounter
Admission: RE | Admit: 2018-08-28 | Discharge: 2018-08-28 | Disposition: A | Discharge status: Home / Self Care | Payer: Medicare Other | Source: Ambulatory Visit | Attending: Internal Medicine | Admitting: Internal Medicine

## 2018-08-28 LAB — CBC
HCT: 23 % — ABNORMAL LOW (ref 39.0–52.0)
HCT: 22.9 % — ABNORMAL LOW (ref 39.0–52.0)
Hemoglobin: 7.2 g/dL — ABNORMAL LOW (ref 13.0–17.0)
Hemoglobin: 7.2 g/dL — ABNORMAL LOW (ref 13.0–17.0)
MCH: 31.4 pg (ref 26.0–34.0)
MCH: 31.3 pg (ref 26.0–34.0)
MCHC: 31.3 g/dL (ref 30.0–36.0)
MCHC: 31.4 g/dL (ref 30.0–36.0)
MCV: 100.4 fL — ABNORMAL HIGH (ref 80.0–100.0)
MCV: 99.6 fL (ref 80.0–100.0)
PLATELETS: 117 10*3/uL — AB (ref 150–400)
Platelets: 114 10*3/uL — ABNORMAL LOW (ref 150–400)
RBC: 2.29 MIL/uL — ABNORMAL LOW (ref 4.22–5.81)
RBC: 2.3 MIL/uL — ABNORMAL LOW (ref 4.22–5.81)
RDW: 15.8 % — ABNORMAL HIGH (ref 11.5–15.5)
RDW: 15.9 % — AB (ref 11.5–15.5)
WBC: 7.3 10*3/uL (ref 4.0–10.5)
WBC: 7.4 10*3/uL (ref 4.0–10.5)
nRBC: 0 % (ref 0.0–0.2)
nRBC: 0 % (ref 0.0–0.2)

## 2018-08-28 LAB — BASIC METABOLIC PANEL
Anion gap: 7 (ref 5–15)
BUN: 42 mg/dL — AB (ref 8–23)
CO2: 27 mmol/L (ref 22–32)
Calcium: 7.6 mg/dL — ABNORMAL LOW (ref 8.9–10.3)
Chloride: 105 mmol/L (ref 98–111)
Creatinine, Ser: 1.93 mg/dL — ABNORMAL HIGH (ref 0.61–1.24)
GFR calc Af Amer: 37 mL/min — ABNORMAL LOW (ref >60)
GFR calc non Af Amer: 32 mL/min — ABNORMAL LOW (ref >60)
Glucose, Bld: 132 mg/dL — ABNORMAL HIGH (ref 70–99)
Potassium: 4 mmol/L (ref 3.5–5.1)
Sodium: 139 mmol/L (ref 135–145)

## 2018-08-28 LAB — URINE CULTURE
Culture: NO GROWTH
Special Requests: NORMAL

## 2018-08-28 LAB — PREPARE RBC (CROSSMATCH)

## 2018-08-28 MED ORDER — SODIUM CHLORIDE 0.9% IV SOLUTION
Freq: Once | INTRAVENOUS | Status: AC
  Administered 2018-08-28: 11:00:00 via INTRAVENOUS

## 2018-08-28 MED ORDER — HYDROCODONE-ACETAMINOPHEN 5-325 MG PO TABS
1.0000 | ORAL_TABLET | ORAL | Status: DC | PRN
  Administered 2018-08-28: 2 via ORAL
  Administered 2018-08-29: 1 via ORAL
  Administered 2018-08-29: 2 via ORAL
  Filled 2018-08-28: qty 2
  Filled 2018-08-28: qty 1
  Filled 2018-08-28 (×2): qty 2

## 2018-08-28 MED ORDER — FE FUMARATE-B12-VIT C-FA-IFC PO CAPS
1.0000 | ORAL_CAPSULE | Freq: Two times a day (BID) | ORAL | Status: DC
  Administered 2018-08-28 – 2018-09-04 (×15): 1 via ORAL
  Filled 2018-08-28 (×16): qty 1

## 2018-08-28 MED ORDER — SODIUM CHLORIDE 0.9 % IV SOLN
1.0000 g | INTRAVENOUS | Status: DC
  Administered 2018-08-28: 1 g via INTRAVENOUS
  Filled 2018-08-28: qty 1

## 2018-08-28 NOTE — Care Management Important Message (Signed)
Important Message  Patient Details  Name: Dylan [name] MRN: [id number] Date of Birth: [birth year]   Medicare Important Message Given:  Yes    Olegario MessierKathy A Aundraya Dripps 08/28/2018, 9:42 AM

## 2018-08-28 NOTE — Progress Notes (Addendum)
Physical Therapy Treatment Patient Details Name: Dylan [name] MRN: [id number] DOB: December 12, 1935 Today's Date: 08/28/2018    History of Present Illness Pt is 82 yo male s/p ORIF supracondylar femur fx above prosthesis, quadriceps repair. post PCI of RCA x2 in October 2019 who presented to the emergency room after a fall, fracture of left femur. PMH of HTN, beign essential tremor, lymphedema, COPD, CHF, R foot drop    PT Comments    Patient alert, agreeable to PT, reported L knee pain is 8/10. Knee brace locked in knee extension throughout mobility. Pt able to initate movements, but maxAx2 to mobilize to EOB. Pt able to sit EOB, progressing from MaxAx1-CGA to maintain balance with heavy use of bed rails, verbal cues, R foot supported (L knee maintained in extension with brace and PT assist) and significantly extended time. Returned to supine, all needs in reach and family at bedside for visit. Pt also educated about PWB status 1-2x this session, fixated on attempting to put no weight at all through LLE and attempting to hop on RLE when ambulating at a later date. Nursing notified pt requesting pain medication. The patient would benefit from further skilled PT to continue to progress towards goals as well as further assessment of mobility and education.    Follow Up Recommendations  SNF     Equipment Recommendations  Other (comment)    Recommendations for Other Services       Precautions / Restrictions Precautions Precautions: Fall;Knee Required Braces or Orthoses: Knee Immobilizer - Left Knee Immobilizer - Left: On at all times;Other (comment)(locked in extension) Restrictions Weight Bearing Restrictions: Yes LLE Weight Bearing: Partial weight bearing LLE Partial Weight Bearing Percentage or Pounds: 20 Other Position/Activity Restrictions: knee brace locked in extension at all times, no ROM exercises on LLE     Mobility  Bed Mobility Overal bed mobility: Needs Assistance Bed  Mobility: Supine to Sit;Sit to Supine     Supine to sit: Max assist;+2 for physical assistance Sit to supine: Max assist;+2 for physical assistance   General bed mobility comments: Pt able to initate movements, but maxAx2 to achieve transfer. Repositioned in bed with maxAx2 as well.   Transfers                 General transfer comment: unable to attempt at this time  Ambulation/Gait                 Stairs             Wheelchair Mobility    Modified Rankin (Stroke Patients Only)       Balance                                            Cognition Arousal/Alertness: Awake/alert Behavior During Therapy: WFL for tasks assessed/performed Overall Cognitive Status: Within Functional Limits for tasks assessed                                        Exercises General Exercises - Lower Extremity Ankle Circles/Pumps: AROM;Both;10 reps Gluteal Sets: AROM;Both;10 reps Other Exercises Other Exercises: Pt sat EOB progressing  from maxAx1 to CGA with heavy use of bed rails and and verbal cues PT assisting with maintaining LLE in extension/supported. Pt became fatigued towards end, needed more  assistance to maintain seated position. Unable to successfully scoot buttocks to allow L heel to rest on ground despite     General Comments        Pertinent Vitals/Pain Pain Score: 8  Pain Location: L knee  Pain Descriptors / Indicators: Grimacing;Guarding;Moaning;Aching Pain Intervention(s): Limited activity within patient's tolerance;Patient requesting pain meds-RN notified;Monitored during session;Repositioned    Home Living                      Prior Function            PT Goals (current goals can now be found in the care plan section) Progress towards PT goals: Progressing toward goals    Frequency    BID      PT Plan Current plan remains appropriate    Co-evaluation              AM-PAC PT "6 Clicks"  Mobility   Outcome Measure  Help needed turning from your back to your side while in a flat bed without using bedrails?: A Lot Help needed moving from lying on your back to sitting on the side of a flat bed without using bedrails?: A Lot Help needed moving to and from a bed to a chair (including a wheelchair)?: Total Help needed standing up from a chair using your arms (e.g., wheelchair or bedside chair)?: Total Help needed to walk in hospital room?: Total Help needed climbing 3-5 steps with a railing? : Total 6 Click Score: 8    End of Session Equipment Utilized During Treatment: Left knee immobilizer Activity Tolerance: Patient limited by fatigue Patient left: in bed;with call bell/phone within reach;with bed alarm set;with family/visitor present;with SCD's reapplied Nurse Communication: Patient requests pain meds PT Visit Diagnosis: Unsteadiness on feet (R26.81);Muscle weakness (generalized) (M62.81);History of falling (Z91.81);Difficulty in walking, not elsewhere classified (R26.2);Pain Pain - Right/Left: Left Pain - part of body: Knee     Time: [id number] PT Time Calculation (min) (ACUTE ONLY): 45 min  Charges:  $Therapeutic Activity: 23-37 mins $Neuromuscular Re-education: 8-22 mins                     Olga Coasteriana Decker Cogdell PT, DPT 4:41 PM,08/28/18 [id number]

## 2018-08-28 NOTE — Progress Notes (Signed)
   Subjective: 1 Day Post-Op Procedure(s) (LRB): OPEN REDUCTION INTERNAL FIXATION (ORIF) SUPRACONDYLAR FEMUR FRACTURE ABOVE PROSTHESIS, QUADRICEPS REPAIR (Left) Patient reports pain as mild.   Patient is well, and has had no acute complaints or problems Denies any CP, SOB, ABD pain. We will continue therapy today.   Objective: Vital signs in last 24 hours: Temp:  [96.5 F (35.8 C)-97.9 F (36.6 C)] 97.6 F (36.4 C) (12/27 0749) Pulse Rate:  [54-70] 62 (12/27 0749) Resp:  [11-19] 19 (12/27 0334) BP: (97-132)/(34-80) 110/73 (12/27 0749) SpO2:  [96 %-100 %] 100 % (12/27 0749) Weight:  [126.1 kg] 126.1 kg (12/26 1235)  Intake/Output from previous day: 12/26 0701 - 12/27 0700 In: 1779.1 [P.O.:240; I.V.:1539.1] Out: 1525 [Urine:1175; Blood:350] Intake/Output this shift: No intake/output data recorded.  Recent Labs    08/26/18 0821 08/27/18 0353 08/27/18 1905 08/28/18 0330  HGB 10.2* 9.1* 8.5* 7.2*   Recent Labs    08/27/18 0353 08/28/18 0330  WBC 7.7 7.3  RBC 2.96* 2.29*  HCT 29.5* 23.0*  PLT 132* 114*   Recent Labs    08/27/18 0353 08/28/18 0330  NA 139 139  K 3.6 4.0  CL 104 105  CO2 26 27  BUN 44* 42*  CREATININE 2.04* 1.93*  GLUCOSE 129* 132*  CALCIUM 8.1* 7.6*   No results for input(s): LABPT, INR in the last 72 hours.  EXAM General - Patient is Alert, Appropriate and Oriented Extremity - Neurovascular intact Sensation intact distally Intact pulses distally Dorsiflexion/Plantar flexion intact No cellulitis present Compartment soft Dressing - dressing C/D/I and no drainage, wound vac intact with 50 cc output Motor Function - intact, moving foot and toes well on exam.   Past Medical History:  Diagnosis Date  . CAD (coronary artery disease)   . Cervicalgia   . CHF (congestive heart failure) (HCC)   . COPD (chronic obstructive pulmonary disease) (HCC)   . Diastolic heart failure (HCC)   . Foot drop, right   . History of kidney stones   .  Hyperlipidemia    unspecified  . Hypertension   . Myocardial infarction (HCC)   . Osteoarthritis   . Shoulder pain, left   . Sleep apnea   . 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   Weight as of this encounter: 126.1 kg. Advance diet Up with therapy, 20% weightbearing left leg, must wear knee immobilizer locked in extension.  No knee range of motion  Acute post op blood loss anemia -transfuse 1 unit packed red blood cells and recheck hemoglobin in the morning  DVT Prophylaxis - TED hose and SCDs, plavix, Aspirin    T. Cranston Neighborhris Gaines, PA-C [facility] 08/28/2018, 8:23 AM

## 2018-08-28 NOTE — Anesthesia Postprocedure Evaluation (Signed)
Anesthesia Post Note  Patient: Orpah ClintonJerry B Pender  Procedure(s) Performed: OPEN REDUCTION INTERNAL FIXATION (ORIF) SUPRACONDYLAR FEMUR FRACTURE ABOVE PROSTHESIS, QUADRICEPS REPAIR (Left Leg Upper)  Patient location during evaluation: PACU Anesthesia Type: General Level of consciousness: awake and alert and oriented Pain management: pain level controlled Vital Signs Assessment: post-procedure vital signs reviewed and stable Respiratory status: spontaneous breathing Cardiovascular status: blood pressure returned to baseline Anesthetic complications: no     Last Vitals:  Vitals:   08/27/18 2359 08/28/18 0334  BP: (!) 109/53 (!) 104/51  Pulse: 66 60  Resp: 19 19  Temp: 36.6 C (!) 36.4 C  SpO2: 99% 100%    Last Pain:  Vitals:   08/28/18 0334  TempSrc: Oral  PainSc:                  Ethelean Colla

## 2018-08-28 NOTE — Clinical Social Work Placement (Signed)
   CLINICAL SOCIAL WORK PLACEMENT  NOTE  Date:  08/28/2018  Patient Details  Name: Dylan [name] MRN: [id number] Date of Birth: August 11, 1936  Clinical Social Work is seeking post-discharge placement for this patient at the Skilled  Nursing Facility level of care (*CSW will initial, date and re-position this form in  chart as items are completed):  Yes   Patient/family provided with [facility] Clinical Social Work Department's list of facilities offering this level of care within the geographic area requested by the patient (or if unable, by the patient's family).  Yes   Patient/family informed of their freedom to choose among providers that offer the needed level of care, that participate in Medicare, Medicaid or managed care program needed by the patient, have an available bed and are willing to accept the patient.  Yes   Patient/family informed of [facility]'s ownership interest in [facility] and [facility], as well as of the fact that they are under no obligation to receive care at these facilities.  PASRR submitted to EDS on       PASRR number received on       Existing PASRR number confirmed on 08/28/18     FL2 transmitted to all facilities in geographic area requested by pt/family on 08/28/18     FL2 transmitted to all facilities within larger geographic area on       Patient informed that his/her managed care company has contracts with or will negotiate with certain facilities, including the following:        Yes   Patient/family informed of bed offers received.  Patient chooses bed at [facility]     Physician recommends and patient chooses bed at      Patient to be transferred to   on  .  Patient to be transferred to facility by       Patient family notified on   of transfer.  Name of family member notified:        PHYSICIAN       Additional Comment:    _______________________________________________ Pinchus Weckwerth, Darleen CrockerBailey M, LCSW 08/28/2018, 12:23  PM

## 2018-08-28 NOTE — Clinical Social Work Note (Signed)
Clinical Social Work Assessment  Patient Details  Name: Dylan [name] MRN: [id number] Date of Birth: Jul 07, 1936  Date of referral:  08/28/18               Reason for consult:  Facility Placement                Permission sought to share information with:  Chartered certified accountant granted to share information::  Yes, Verbal Permission Granted  Name::      [place]::   [place]   Relationship::     Contact Information:     Housing/Transportation Living arrangements for the past 2 months:  [place] of Information:  Patient Patient Interpreter Needed:    Criminal Activity/Legal Involvement Pertinent to Current Situation/Hospitalization:  No - Comment as needed Significant Relationships:  Spouse Lives with:  Spouse Do you feel safe going back to the place where you live?  Yes Need for family participation in patient care:  Yes (Comment)  Care giving concerns: Patient lives in [place] with his wife [name].    Social Worker assessment / plan: [place] representative (CSW) received SNF consult. PT is recommending SNF. CSW met with patient alone at bedside to discuss D/C plan. Patient was alert and oriented X4 and was laying in the bed. CSW introduced self and explained role of CSW department. Per patient he lives in [place] with his wife. CSW explained SNF process and that [facility] will have to approve SNF. Patient is agreeable to SNF search and requested [facility]. Per patient he has been to [facility] and [facility] in the past. FL2 complete and faxed out.   CSW presented bed offers to patient and he chose [facility]. Patient is agreeable to a semi-private room at [facility]. Per [facility] admissions coordinator at [facility] she will order patient a bariatric bed with air mattress. Per Lovena Le she will start [facility] SNF authorization today. CSW will continue to follow and assist as needed.   Employment status:  Disabled (Comment on whether or not  currently receiving Disability), Retired Nurse, adult PT Recommendations:  [place] / Referral to community resources:  [place]  Patient/Family's Response to care:  Patient chose [employer]   Patient/Family's Understanding of and Emotional Response to Diagnosis, Current Treatment, and Prognosis:  Patient was very pleasant and thanked CSW for assistance.   Emotional Assessment Appearance:  Appears stated age Attitude/Demeanor/Rapport:    Affect (typically observed):  Accepting, Adaptable, Pleasant Orientation:  Oriented to Self, Oriented to Place, Oriented to  Time, Oriented to Situation Alcohol / Substance use:  Not Applicable Psych involvement (Current and /or in the community):  No (Comment)  Discharge Needs  Concerns to be addressed:  Discharge Planning Concerns Readmission within the last 30 days:  No Current discharge risk:  Dependent with Mobility, Chronically ill Barriers to Discharge:  Continued Medical Work up   [employer], [name], LCSW 08/28/2018, 12:24 PM

## 2018-08-28 NOTE — Progress Notes (Signed)
OT Cancellation Note  Patient Details Name: Dylan [name] MRN: [id number] DOB: 12/20/1935   Cancelled Treatment:    Reason Eval/Treat Not Completed: Patient at procedure or test/ unavailable Pt receiving blood transfusion on second attempt. OT to f/u for evaluation another date as appropriate.   Darl Householderlison M [name] 08/28/2018, 12:28 PM

## 2018-08-28 NOTE — [place] FL2 (Signed)
[place] MEDICAID FL2 LEVEL OF CARE SCREENING TOOL     IDENTIFICATION  Patient Name: Dylan [name] Birthdate: 1936-04-27 Sex: male Admission Date (Current Location): 08/26/2018  [place] and [place] Number:  Chiropodist and Address:  [facility], [address] [place], [place] [address]      Provider Number: [id number]  Attending Physician Name and Address:  Adrian Saran, MD  Relative Name and Phone Number:       Current Level of Care: Hospital Recommended Level of Care: Skilled Nursing Facility Prior Approval Number:    Date Approved/Denied:   PASRR Number: [id number])  Discharge Plan: SNF    Current Diagnoses: Patient Active Problem List   Diagnosis Date Noted  . Periprosthetic fracture around internal prosthetic left knee joint 08/26/2018  . Acute ST elevation myocardial infarction (STEMI) of inferior wall (HCC) 06/21/2018  . S/P total knee arthroplasty 06/01/2018  . Primary osteoarthritis of left knee 02/05/2018  . Sepsis (HCC) 01/10/2018  . Pressure injury of skin 12/02/2017  . Community acquired pneumonia 12/01/2017  . Pneumonia 12/01/2017  . Hypertension 09/23/2017  . Hyperlipidemia 09/23/2017  . Hematoma of leg, right, subsequent encounter 09/22/2017  . Blood blister 09/22/2017  . Weakness 08/31/2017  . Benign essential tremor 10/18/2016  . Class 3 severe obesity due to excess calories with serious comorbidity and body mass index (BMI) of 45.0 to 49.9 in adult (HCC) 10/18/2016  . Varicose veins of lower extremities with ulcer (HCC) 06/17/2016  . Chronic venous insufficiency 06/17/2016  . Lymphedema 06/17/2016  . Acute on chronic diastolic CHF (congestive heart failure) (HCC) 06/10/2016  . COPD, severe (HCC) 05/24/2016  . AV block, Mob[place] 1 02/09/2016  . Coronary atherosclerosis of native coronary artery 02/09/2016  . ST elevation MI (STEMI) (HCC) 02/08/2016  . CHF (congestive heart failure) (HCC) 12/15/2015  .  Right foot drop 04/07/2014  . Rotator cuff rupture, complete 04/22/2013  . Neck pain 04/14/2013    Orientation RESPIRATION BLADDER Height & Weight     Self, Time, Situation, Place  O2(2 Liters Oxygen. ) Continent Weight: 278 lb (126.1 kg) Height:  5\' 10"  (177.8 cm)  BEHAVIORAL SYMPTOMS/MOOD NEUROLOGICAL BOWEL NUTRITION STATUS      Continent Diet(Diet: Regular )  AMBULATORY STATUS COMMUNICATION OF NEEDS Skin   Extensive Assist Verbally Surgical wounds, Wound Vac(Incision: Left Leg, Provena Wound Vac. )                       Personal Care Assistance Level of Assistance  Bathing, Feeding, Dressing Bathing Assistance: Limited assistance Feeding assistance: Independent Dressing Assistance: Limited assistance     Functional Limitations Info  Sight, Hearing, Speech Sight Info: Adequate Hearing Info: Adequate Speech Info: Adequate    SPECIAL CARE FACTORS FREQUENCY  PT (By licensed PT), OT (By licensed OT)     PT Frequency: (5) OT Frequency: (5)            Contractures      Additional Factors Info  Code Status, Allergies Code Status Info: (Full Code. ) Allergies Info: (No Known Allergies. )           Current Medications (08/28/2018):  This is the current hospital active medication list Current Facility-Administered Medications  Medication Dose Route Frequency Provider Last Rate Last Dose  . 0.9 %  sodium chloride infusion (Manually program via Guardrails IV Fluids)   Intravenous Once Mody, Sital, MD      . 0.9 %  sodium chloride infusion  Intravenous Continuous Kennedy BuckerMenz, Michael, MD   Stopped at 08/27/18 1223  . 0.9 %  sodium chloride infusion   Intravenous Continuous Kennedy BuckerMenz, Michael, MD 75 mL/hr at 08/28/18 0558    . alum & mag hydroxide-simeth (MAALOX/MYLANTA) 200-200-20 MG/5ML suspension 30 mL  30 mL Oral Q4H PRN Kennedy BuckerMenz, Michael, MD      . aspirin EC tablet 81 mg  81 mg Oral Daily Kennedy BuckerMenz, Michael, MD   81 mg at 08/28/18 0820  . atorvastatin (LIPITOR) tablet 80 mg  80  mg Oral Daily Kennedy BuckerMenz, Michael, MD   80 mg at 08/28/18 0819  . bisacodyl (DULCOLAX) suppository 10 mg  10 mg Rectal Daily PRN Kennedy BuckerMenz, Michael, MD      . carvedilol (COREG) tablet 6.25 mg  6.25 mg Oral BID WC Kennedy BuckerMenz, Michael, MD   6.25 mg at 08/28/18 0818  . ceFAZolin (ANCEF) 3 g in dextrose 5 % 50 mL IVPB  3 g Intravenous Q6H Kennedy BuckerMenz, Michael, MD 100 mL/hr at 08/28/18 0127 3 g at 08/28/18 0127  . ceFAZolin (ANCEF) IVPB 2g/100 mL premix  2 g Intravenous Once Kennedy BuckerMenz, Michael, MD      . cefTRIAXone (ROCEPHIN) 1 g in sodium chloride 0.9 % 100 mL IVPB  1 g Intravenous Q24H Adrian SaranMody, Sital, MD 200 mL/hr at 08/28/18 0806 1 g at 08/28/18 0806  . clopidogrel (PLAVIX) tablet 75 mg  75 mg Oral Daily Kennedy BuckerMenz, Michael, MD   75 mg at 08/28/18 0818  . docusate sodium (COLACE) capsule 100 mg  100 mg Oral BID Kennedy BuckerMenz, Michael, MD   100 mg at 08/28/18 0818  . ferrous fumarate-b12-vitamic C-folic acid (TRINSICON / FOLTRIN) capsule 1 capsule  1 capsule Oral BID PC Kennedy BuckerMenz, Michael, MD      . gabapentin (NEURONTIN) capsule 400 mg  400 mg Oral QHS Kennedy BuckerMenz, Michael, MD   400 mg at 08/27/18 2135  . HYDROcodone-acetaminophen (NORCO/VICODIN) 5-325 MG per tablet 1-2 tablet  1-2 tablet Oral Q6H PRN Kennedy BuckerMenz, Michael, MD   2 tablet at 08/28/18 0818  . isosorbide mononitrate (IMDUR) 24 hr tablet 30 mg  30 mg Oral Daily Kennedy BuckerMenz, Michael, MD   30 mg at 08/28/18 0818  . lactated ringers infusion   Intravenous Continuous Kennedy BuckerMenz, Michael, MD 50 mL/hr at 08/27/18 1241    . magnesium citrate solution 1 Bottle  1 Bottle Oral Once PRN Kennedy BuckerMenz, Michael, MD      . magnesium hydroxide (MILK OF MAGNESIA) suspension 30 mL  30 mL Oral Daily PRN Kennedy BuckerMenz, Michael, MD      . menthol-cetylpyridinium (CEPACOL) lozenge 3 mg  1 lozenge Oral PRN Kennedy BuckerMenz, Michael, MD       Or  . phenol (CHLORASEPTIC) mouth spray 1 spray  1 spray Mouth/Throat PRN Kennedy BuckerMenz, Michael, MD      . methocarbamol (ROBAXIN) tablet 500 mg  500 mg Oral Q6H PRN Kennedy BuckerMenz, Michael, MD   500 mg at 08/27/18 2135   Or  . methocarbamol  (ROBAXIN) 500 mg in dextrose 5 % 50 mL IVPB  500 mg Intravenous Q6H PRN Kennedy BuckerMenz, Michael, MD      . metoCLOPramide (REGLAN) tablet 5-10 mg  5-10 mg Oral Q8H PRN Kennedy BuckerMenz, Michael, MD       Or  . metoCLOPramide (REGLAN) injection 5-10 mg  5-10 mg Intravenous Q8H PRN Kennedy BuckerMenz, Michael, MD      . morphine 2 MG/ML injection 0.5 mg  0.5 mg Intravenous Q2H PRN Kennedy BuckerMenz, Michael, MD   0.5 mg at 08/28/18 0052  . multivitamin with  minerals tablet 1 tablet  1 tablet Oral Daily Kennedy BuckerMenz, Michael, MD   1 tablet at 08/28/18 0819  . nitroGLYCERIN (NITROSTAT) SL tablet 0.4 mg  0.4 mg Sublingual Q5 min PRN Kennedy BuckerMenz, Michael, MD      . ondansetron [facility](ZOFRAN) tablet 4 mg  4 mg Oral Q6H PRN Kennedy BuckerMenz, Michael, MD       Or  . ondansetron [facility](ZOFRAN) injection 4 mg  4 mg Intravenous Q6H PRN Kennedy BuckerMenz, Michael, MD      . primidone (MYSOLINE) tablet 200 mg  200 mg Oral BID Kennedy BuckerMenz, Michael, MD   200 mg at 08/28/18 0819  . sodium chloride 0.9 % bolus 250 mL  250 mL Intravenous Once Kennedy BuckerMenz, Michael, MD      . umeclidinium-vilanterol [facility](ANORO ELLIPTA) 62.5-25 MCG/INH 1 puff  1 puff Inhalation Daily Kennedy BuckerMenz, Michael, MD   1 puff at 08/28/18 [id number]  . vitamin C (ASCORBIC ACID) tablet 500 mg  500 mg Oral Daily Kennedy BuckerMenz, Michael, MD   500 mg at 08/28/18 [id number]  . zolpidem (AMBIEN) tablet 5 mg  5 mg Oral QHS PRN Kennedy BuckerMenz, Michael, MD   5 mg at 08/28/18 0131     Discharge Medications: Please see discharge summary for a list of discharge medications.  Relevant Imaging Results:  Relevant Lab Results:   Additional Information (SSN: [id number])  Wendal Wilkie, Darleen CrockerBailey M, LCSW

## 2018-08-28 NOTE — Progress Notes (Signed)
[employer] [facility] [facility] [place] at [facility]   PATIENT NAME: Antionette CharJerry Campi    MR#:  [id number]  DATE OF BIRTH:  1936/01/04  SUBJECTIVE:   hgb dropped this am Denies lightheaded, chest pain SOB Pain better controlled   REVIEW OF SYSTEMS:    Review of Systems  Constitutional: Negative for fever, chills weight loss HENT: Negative for ear pain, nosebleeds, congestion, facial swelling, rhinorrhea, neck pain, neck stiffness and ear discharge.   Respiratory: Negative for cough, shortness of breath, wheezing  Cardiovascular: Negative for chest pain, palpitations and leg swelling.  Gastrointestinal: Negative for heartburn, abdominal pain, vomiting, diarrhea or consitpation Genitourinary: Negative for dysuria, urgency, frequency, hematuria Musculoskeletal: Negative for back pain Neurological: Negative for dizziness, seizures, syncope, focal weakness,  numbness and headaches.  Hematological: Does not bruise/bleed easily.  Psychiatric/Behavioral: Negative for hallucinations, confusion, dysphoric mood    Tolerating Diet: yes     DRUG ALLERGIES:  No Known Allergies  VITALS:  Blood pressure 110/73, pulse 62, temperature 97.6 F (36.4 C), temperature source Oral, resp. rate 19, height 5\' 10"  (1.778 m), weight 126.1 kg, SpO2 100 %.  PHYSICAL EXAMINATION:  Constitutional: Appears well-developed and well-nourished. No distress. HENT: Normocephalic. Marland Kitchen. Oropharynx is clear and moist.  Eyes: Conjunctivae and EOM are normal. PERRLA, no scleral icterus.  Neck: Normal ROM. Neck supple. No JVD. No tracheal deviation. CVS: RRR, S1/S2 +, no murmurs, no gallops, no carotid bruit.  Pulmonary: Effort and breath sounds normal, no stridor, rhonchi, wheezes, rales.  Abdominal: Soft. BS +,  no distension, tenderness, rebound or guarding.  Musculoskeletal: Extremities with swelling to left distal thigh, skin intact, healied anterior knee incision.  Small bruised areas to great toe on left with  dressing on right footNeuro: Alert. CN 2-12 grossly intact. No focal deficits. Skin: Skin is warm and dry. No rash noted. Psychiatric: Normal mood and affect.      LABORATORY PANEL:   CBC Recent Labs  Lab 08/28/18 0330  WBC 7.3  HGB 7.2*  HCT 23.0*  PLT 114*   ------------------------------------------------------------------------------------------------------------------  Chemistries  Recent Labs  Lab 08/26/18 0821  08/28/18 0330  NA 139   < > 139  K 3.9   < > 4.0  CL 103   < > 105  CO2 28   < > 27  GLUCOSE 136*   < > 132*  BUN 38*   < > 42*  CREATININE 1.95*   < > 1.93*  CALCIUM 8.2*   < > 7.6*  AST 38  --   --   ALT 37  --   --   ALKPHOS 73  --   --   BILITOT 0.9  --   --    < > = values in this interval not displayed.   ------------------------------------------------------------------------------------------------------------------  Cardiac Enzymes Recent Labs  Lab 08/26/18 0821  TROPONINI 0.07*   ------------------------------------------------------------------------------------------------------------------  RADIOLOGY:  Dg Knee 1-2 Views Left  Result Date: 08/27/2018 CLINICAL DATA:  ORIF of distal left femoral fracture. EXAM: LEFT KNEE - 1-2 VIEW COMPARISON:  August 26, 2018 FINDINGS: A plate has been affixed to the distal femur utilizing multiple screws. Patient is status post knee replacement. Postoperative soft tissue air noted. IMPRESSION: Continued distal femoral fracture. A plate now crosses the fracture line, affixed proximally and distally with multiple screws. Left knee replacement. Electronically Signed   By: Gerome Samavid  Williams III M.D   On: 08/27/2018 16:22   Dg Chest Port 1 View  Result Date: 08/26/2018 CLINICAL DATA:  Left distal femoral periprosthetic fracture. Preoperative assessment. History of coronary artery disease. EXAM: PORTABLE CHEST 1 VIEW COMPARISON:  Chest radiograph 04/23/2018 FINDINGS: Atherosclerotic calcification of the  aortic arch. Retrocardiac density with obscuration of the left hemidiaphragm. Mild enlargement of the cardiopericardial silhouette, without edema. IMPRESSION: 1. Retrocardiac airspace opacity with obscuration of the left costophrenic angle, not entirely specific but possibly from layering pleural effusion of passive atelectasis. 2.  Aortic Atherosclerosis (ICD10-I70.0). 3. Mild enlargement of the cardiopericardial silhouette. No appreciable edema. Electronically Signed   By: Gaylyn RongWalter  Liebkemann M.D.   On: 08/26/2018 13:41   Dg C-arm 1-60 Min-no Report  Result Date: 08/27/2018 Fluoroscopy was utilized by the requesting physician.  No radiographic interpretation.     ASSESSMENT AND PLAN:   82 year old male with history of CAD status post PCI of RCA x2 in October 2019 who presented to the emergency room after a fall with a fracture of left femur.   1.  Periprosthetic fracture around internal prosthetic left knee joint (LEFT SUPRACONDYLAR FEMUR FRACTURE) POD #1 ORIF Patient with post operative blood loss anemia Transfuse 1 unit this am Patient has consented  DVT prophylaxis Management as per orthopedic surgery.  2.  CAD: Patient evaluated by cardiology and cleared for surgery Continue dual antiplatelet therapy given history of recent STEMI Continue aspirin, statin, Coreg, isosorbide Continue Plavix (watch hgb tomorrow)  3.  Chronic systolic heart failure ejection fraction 30 to 35% without signs of exacerbation Continue Coreg  4.  Essential hypertension: Continue isosorbide and Coreg 5. UTI Rocephin and f/u urine culture 6. CKD stage 3: Creatinine is at baseline  Management plans discussed with the patient and he is in agreement.  CODE STATUS: Full  TOTAL TIME TAKING CARE OF THIS PATIENT: 26 minutes.     POSSIBLE D/C tomorrow to SNF, DEPENDING ON CLINICAL CONDITION.   Nezzie Manera M.D on 08/28/2018 at 10:27 AM  Between 7am to 6pm - Pager - [phone] After 6pm go to  [email or web address] - password [email or web address]  [facility] [place] [facility]  Office  [phone]  CC: Primary care physician; Jaclyn Shaggyate, [name], MD  Note: This dictation was prepared with Dragon dictation along with smaller phrase technology. Any transcriptional errors that result from this process are unintentional.

## 2018-08-28 NOTE — Evaluation (Signed)
Physical Therapy Evaluation Patient Details Name: Dylan [name] MRN: [id number] DOB: [birth year] Today's Date: 08/28/2018   History of Present Illness  Pt is 82 yo male s/p ORIF supracondylar femur fx above prosthesis, quadriceps repair. post PCI of RCA x2 in October 2019 who presented to the emergency room after a fall, fracture of left femur. PMH of HTN, beign essential tremor, lymphedema, COPD, CHF, R foot drop  Clinical Impression  Patient alert and in bed, agreeable to PT evaluation with encouragement. Pt reported living with wife in one story home, with family, neighbor/CNA to assist as needed (except for a few hours during the day pt has almost 24/7 supervision). Reported needing assistance with ADLS, IADLs, able to ambulate from bed to chair and bedside commode, x2 falls this week (that led to this admission) currently receiving home health PT. Hemoglobin 7.2, awaiting transfusion.  Patient with complaints of L knee pain that increased from 7/10 to 9/10 with quad sets. Able to perform isometric exercises on LLE. KI in place locked in extension throughout session. Pt educated on PWB status, PT POC and role during hospital stay, and importance of mobility. Due to mild SOB at rest (vitals WFLs), pt increased pain as well as request to rest, further mobility held this session. The patient would benefit from further skilled PT to address limitations in mobility, independence, and safety, as well as further education and assessment. Current recommendation is STR due to wife being unable to provide adequate physical assist as needed, co-morbidities that affect mobility ( R foot drop, COPD, CHF), as well as safety concerns for mobility.   Follow Up Recommendations SNF    Equipment Recommendations  Other (comment)(TBD at next venue of care)    Recommendations for Other Services       Precautions / Restrictions Precautions Precautions: Fall;Knee Required Braces or Orthoses: Knee Immobilizer -  Left Knee Immobilizer - Left: On at all times;Other (comment)(locked in extension) Restrictions Weight Bearing Restrictions: Yes LLE Weight Bearing: Partial weight bearing LLE Partial Weight Bearing Percentage or Pounds: 20 Other Position/Activity Restrictions: knee brace locked in extension at all times, no ROM exercises on LLE       Mobility  Bed Mobility               General bed mobility comments: deferred due to pts increase in pain (9/10) with quad sets, mild SOB at rest (vitals stable), fatigue, pt request  Transfers                    Ambulation/Gait                Stairs            Wheelchair Mobility    Modified Rankin (Stroke Patients Only)       Balance                                             Pertinent Vitals/Pain Pain Assessment: 0-10 Pain Score: 9  Pain Location: L knee  Pain Descriptors / Indicators: Grimacing;Guarding;Moaning Pain Intervention(s): Limited activity within patient's tolerance;Patient requesting pain meds-RN notified;Monitored during session;Repositioned    Home Living Family/patient expects to be discharged to:: Private residence Living Arrangements: Spouse/significant other Available Help at Discharge: Family;Neighbor;Personal care attendant Type of Home: House Home Access: Ramped entrance     Home Layout: One level Home Equipment: Dan HumphreysWalker -  2 wheels;Cane - single point;Bedside commode;Shower seat;Grab bars - tub/shower;Wheelchair - Careers adviser (comment)(lift chair (recliner)) Additional Comments: Pt reported that he spends most of his day in his recliner.    Prior Function Level of Independence: Needs assistance   Gait / Transfers Assistance Needed: Pt able to ambulate from bed to [facility] and to bedside commode with RW with supervision.   ADL's / Homemaking Assistance Needed: Assistance needed for ADLs, dependent for IADLs.  Comments: R AFO. Wife available intermittently Neighbor,  and CNA that provide care, son checks in on. Only a few hours during the day that pt does not have supervision. Pt stated he fell x2 on Tuesday (12/24), led to this admission.     Hand Dominance   Dominant Hand: Right    Extremity/Trunk Assessment   Upper Extremity Assessment Upper Extremity Assessment: Generalized weakness    Lower Extremity Assessment Lower Extremity Assessment: RLE deficits/detail;LLE deficits/detail;Generalized weakness RLE Deficits / Details: able to perform SLR without physical assist, though effortful LLE: Unable to fully assess due to immobilization       Communication   Communication: No difficulties  Cognition Arousal/Alertness: Awake/alert Behavior During Therapy: WFL for tasks assessed/performed Overall Cognitive Status: Within Functional Limits for tasks assessed                                        General Comments      Exercises General Exercises - Lower Extremity Ankle Circles/Pumps: AROM;Left;10 reps;AAROM;Right Quad Sets: AROM;Left;10 reps;Strengthening Gluteal Sets: AROM;Both;10 reps;Strengthening Heel Slides: AROM;Strengthening;Right;10 reps Hip ABduction/ADduction: AROM;Strengthening;Right;10 reps Straight Leg Raises: AROM;Strengthening;Right;10 reps   Assessment/Plan    PT Assessment Patient needs continued PT services  PT Problem List Decreased strength;Pain;Decreased range of motion;Decreased activity tolerance;Decreased knowledge of use of DME;Decreased balance;Decreased safety awareness;Decreased mobility;Obesity;Decreased knowledge of precautions       PT Treatment Interventions DME instruction;Therapeutic exercise;Gait training;Balance training;Neuromuscular re-education;Functional mobility training;Therapeutic activities;Patient/family education    PT Goals (Current goals can be found in the Care Plan section)  Acute Rehab PT Goals Patient Stated Goal: to get stronger PT Goal Formulation: With  patient Time For Goal Achievement: 09/11/18 Potential to Achieve Goals: Good    Frequency BID   Barriers to discharge Decreased caregiver support      Co-evaluation               AM-PAC PT "6 Clicks" Mobility  Outcome Measure Help needed turning from your back to your side while in a flat bed without using bedrails?: A Lot Help needed moving from lying on your back to sitting on the side of a flat bed without using bedrails?: A Lot Help needed moving to and from a bed to a chair (including a wheelchair)?: A Lot Help needed standing up from a chair using your arms (e.g., wheelchair or bedside chair)?: A Lot Help needed to walk in hospital room?: Total Help needed climbing 3-5 steps with a railing? : Total 6 Click Score: 10    End of Session Equipment Utilized During Treatment: Left knee immobilizer Activity Tolerance: Patient limited by pain;Patient limited by fatigue Patient left: in bed;with call bell/phone within reach;with bed alarm set Nurse Communication: Patient requests pain meds PT Visit Diagnosis: Unsteadiness on feet (R26.81);Muscle weakness (generalized) (M62.81);History of falling (Z91.81);Difficulty in walking, not elsewhere classified (R26.2);Pain Pain - Right/Left: Left Pain - part of body: Knee    Time: 6962-9528 PT Time Calculation (min) (ACUTE  ONLY): 32 min   Charges:   PT Evaluation $PT Eval Moderate Complexity: 1 Mod PT Treatments $Therapeutic Exercise: 8-22 mins        Olga Coasteriana Oris Staffieri PT, DPT 11:41 AM,08/28/18 [phone]

## 2018-08-28 NOTE — Progress Notes (Signed)
OT Cancellation Note  Patient Details Name: Dylan [name] MRN: [id number] DOB: 04-01-1936   Cancelled Treatment:    Reason Eval/Treat Not Completed: Patient at procedure or test/ unavailable Lab personnel present in pt's room at this time. Pt's Hgb noted to be low (7.2), and lab reassessing with transfusion ordered. Will f/u as appropriate.   Darl Householderlison M Terica Yogi 08/28/2018, 10:47 AM

## 2018-08-29 LAB — BASIC METABOLIC PANEL
Anion gap: 6 (ref 5–15)
BUN: 38 mg/dL — AB (ref 8–23)
CO2: 25 mmol/L (ref 22–32)
Calcium: 7.9 mg/dL — ABNORMAL LOW (ref 8.9–10.3)
Chloride: 106 mmol/L (ref 98–111)
Creatinine, Ser: 1.6 mg/dL — ABNORMAL HIGH (ref 0.61–1.24)
GFR calc Af Amer: 46 mL/min — ABNORMAL LOW (ref >60)
GFR calc non Af Amer: 40 mL/min — ABNORMAL LOW (ref >60)
Glucose, Bld: 144 mg/dL — ABNORMAL HIGH (ref 70–99)
Potassium: 3.9 mmol/L (ref 3.5–5.1)
Sodium: 137 mmol/L (ref 135–145)

## 2018-08-29 LAB — CBC
HCT: 26.5 % — ABNORMAL LOW (ref 39.0–52.0)
Hemoglobin: 8.4 g/dL — ABNORMAL LOW (ref 13.0–17.0)
MCH: 30.9 pg (ref 26.0–34.0)
MCHC: 31.7 g/dL (ref 30.0–36.0)
MCV: 97.4 fL (ref 80.0–100.0)
Platelets: 138 10*3/uL — ABNORMAL LOW (ref 150–400)
RBC: 2.72 MIL/uL — ABNORMAL LOW (ref 4.22–5.81)
RDW: 17.3 % — AB (ref 11.5–15.5)
WBC: 7.4 10*3/uL (ref 4.0–10.5)
nRBC: 0.4 % — ABNORMAL HIGH (ref 0.0–0.2)

## 2018-08-29 LAB — TYPE AND SCREEN
ABO/RH(D): O NEG
Antibody Screen: NEGATIVE
UNIT DIVISION: 0
Unit division: 0

## 2018-08-29 LAB — BPAM RBC
Blood Product Expiration Date: 202001012359
Blood Product Expiration Date: 202001012359
ISSUE DATE / TIME: 201912271052
UNIT TYPE AND RH: 9500
Unit Type and Rh: 9500

## 2018-08-29 MED ORDER — SODIUM CHLORIDE 0.9 % IV SOLN
1.0000 g | INTRAVENOUS | Status: DC
  Administered 2018-08-29 – 2018-08-30 (×2): 1 g via INTRAVENOUS
  Filled 2018-08-29 (×2): qty 1

## 2018-08-29 NOTE — Evaluation (Signed)
Occupational Therapy Evaluation Patient Details Name: Dylan [name] MRN: [id number] DOB: 12/29/1935 Today's Date: 08/29/2018    History of Present Illness Pt is 82 yo male s/p ORIF supracondylar femur fx above prosthesis, quadriceps repair. post PCI of RCA x2 in October 2019 who presented to the emergency room after a fall, fracture of left femur. PMH of HTN, beign essential tremor, lymphedema, COPD, CHF, R foot drop   Clinical Impression   Pt present at OT eval with decrease bilateral shoulder ROM and strength - old RC issues. Pt show decrease LE strength and ROM -increase pain in L LE- and drop foot on the R with ulcer on big toe. Pt in brace locked in extention on the L and PWB - pt show decrease sitting balance and functional mobility in ADL.s and dependent in ADL's except eating. Mod A for grooming. Pt was needing  3 + supine<> sit - to max sitting EOB,  to CG sitting balance with mod v/c - pt can benefit from OT services     Follow Up Recommendations  SNF    Equipment Recommendations  (Wife report they have most all DME)    Recommendations for Other Services       Precautions / Restrictions Precautions Precautions: Fall;Knee Required Braces or Orthoses: Knee Immobilizer - Left Knee Immobilizer - Left: On at all times;Other (comment) Restrictions Weight Bearing Restrictions: Yes LLE Weight Bearing: Partial weight bearing LLE Partial Weight Bearing Percentage or Pounds: 20 Other Position/Activity Restrictions: knee brace locked in extension at all times, no ROM exercises on LLE       Mobility Bed Mobility Overal bed mobility: Needs Assistance    General bed mobility comments: Pt able to initate movements, but maxAx3 to achieve SIT<> SUPINE. Repositioned in bed with maxAx3 as well.   Transfers                 General transfer comment: unable to attempt at this time    Balance Overall balance assessment: (max A sitting , but gradually CG leaning FW and putting  hands on knees )                                         ADL either performed or assessed with clinical judgement   ADL                                         General ADL Comments: Depenedent in grooming, bathing and dressing      Vision Baseline Vision/History: Wears glasses       Perception     Praxis      Pertinent Vitals/Pain Pain Assessment: 0-10 Pain Score: 8  Pain Location: L knee Pain Descriptors / Indicators: Grimacing;Guarding;Moaning;Aching Pain Intervention(s): Monitored during session;Limited activity within patient's tolerance     Hand Dominance Right   Extremity/Trunk Assessment Upper Extremity Assessment Upper Extremity Assessment: (Bilateral UE shoulder - only to 90 degrees )           Communication Communication Communication: No difficulties   Cognition Arousal/Alertness: Lethargic Behavior During Therapy: WFL for tasks assessed/performed Overall Cognitive Status: Within Functional Limits for tasks assessed  General Comments       Exercises Other Exercises: Pt sat at EOB for 5-7 min with frequent VC's to lean anteriorly.  Pt was eventually able to sit upright with SBA and less frequent VC's.  Pt declined attempt to STS.   Shoulder Instructions      Home Living Family/patient expects to be discharged to:: Private residence Living Arrangements: Spouse/significant other Available Help at Discharge: Family;Neighbor;Personal care attendant Type of Home: House Home Access: Ramped entrance     Home Layout: One level               Home Equipment: Walker - 2 wheels;Cane - single point;Bedside commode;Shower seat;Grab bars - tub/shower;Wheelchair - manual;Other (comment)   Additional Comments: Pt reported that he spends most of his day in his recliner.      Prior Functioning/Environment Level of Independence: Needs assistance  Gait / Transfers  Assistance Needed: Pt able to amb[facility] to [facility] and to bedside commode with RW with supervision.  ADL's / Homemaking Assistance Needed: Assistance needed for ADLs, dependent for IADLs.            OT Problem List: Decreased strength;Decreased knowledge of use of DME or AE;Decreased activity tolerance;Impaired UE functional use;Impaired balance (sitting and/or standing);Pain      OT Treatment/Interventions: Self-care/ADL training;Therapeutic exercise;Patient/family education;Neuromuscular education;Balance training;DME and/or AE instruction;Therapeutic activities    OT Goals(Current goals can be found in the care plan section) Acute Rehab OT Goals Patient Stated Goal: To get better again  OT Goal Formulation: With patient Time For Goal Achievement: 09/12/18 Potential to Achieve Goals: Fair  OT Frequency: Min 3X/week   Barriers to D/C:            Co-evaluation PT/OT/SLP Co-Evaluation/Treatment: Yes Reason for Co-Treatment: For patient/therapist safety;Complexity of the patient's impairments (multi-system involvement)   OT goals addressed during session: ADL's and self-care      AM-PAC OT "6 Clicks" Daily Activity     Outcome Measure Help from another person eating meals?: A Little Help from another person taking care of personal grooming?: A Lot Help from another person toileting, which includes using toliet, bedpan, or urinal?: Total Help from another person bathing (including washing, rinsing, drying)?: Total Help from another person to put on and taking off regular upper body clothing?: Total Help from another person to put on and taking off regular lower body clothing?: Total 6 Click Score: 9   End of Session    Activity Tolerance: Patient limited by fatigue;Patient limited by pain Patient left: in bed;with call bell/phone within reach;with family/visitor present;with bed alarm set  OT Visit Diagnosis: Repeated falls (R29.6);Muscle weakness (generalized)  (M62.81);History of falling (Z91.81);Pain Pain - Right/Left: Left Pain - part of body: Knee                Time: [id number] OT Time Calculation (min): 44 min Charges:  OT General Charges $OT Visit: 1 Visit OT Evaluation $OT Eval High Complexity: 1 High   Karlo Goeden OTR/L,CLT 08/29/2018, 1:26 PM

## 2018-08-29 NOTE — Progress Notes (Signed)
Physical Therapy Treatment Patient Details Name: Dylan [name] MRN: [id number] DOB: 10-15-1935 Today's Date: 08/29/2018    History of Present Illness Pt is 82 yo male s/p ORIF supracondylar femur fx above prosthesis, quadriceps repair. post PCI of RCA x2 in October 2019 who presented to the emergency room after a fall, fracture of left femur. PMH of HTN, beign essential tremor, lymphedema, COPD, CHF, R foot drop    PT Comments    Pt very lethargic this afternoon and reporting pain in L knee and R foot.  He was able to complete all there ex with AAROM and did demonstrate improvement in range achieved with hip abduction and SAQ's.  Pt open to education regarding recovery process and stated that he "can't wait until this is all over".  Pt positioned for prevention of pressure ulcers upon termination of treatment but encouraged to perform HEP frequently.  Pt will continue to benefit from skilled PT with focus on strength, tolerance to activity and pain management.   Follow Up Recommendations  SNF     Equipment Recommendations  Other (comment)    Recommendations for Other Services       Precautions / Restrictions Precautions Precautions: Fall;Knee Required Braces or Orthoses: Knee Immobilizer - Left Knee Immobilizer - Left: On at all times;Other (comment) Restrictions Weight Bearing Restrictions: Yes LLE Weight Bearing: Partial weight bearing Other Position/Activity Restrictions: knee brace locked in extension at all times, no ROM exercises on LLE     Mobility  Bed Mobility Overal bed mobility: (No bed mobility performed) Bed Mobility: Supine to Sit;Sit to Supine     Supine to sit: Max assist;+2 for physical assistance;HOB elevated Sit to supine: Max assist;+2 for physical assistance;HOB elevated   General bed mobility comments: Pt able to initate movements, but maxAx3 to achieve SIT<> SUPINE. Repositioned in bed with maxAx3 as well.   Transfers                     Ambulation/Gait                 Stairs             Wheelchair Mobility    Modified Rankin (Stroke Patients Only)       Balance Overall balance assessment: (max A sitting , but gradually CG leaning FW and putting hands on knees )                                          Cognition Arousal/Alertness: Lethargic Behavior During Therapy: WFL for tasks assessed/performed Overall Cognitive Status: Within Functional Limits for tasks assessed                                 General Comments: Very lethargic, closing eyes throughout session.      Exercises General Exercises - Lower Extremity Ankle Circles/Pumps: Both;10 reps;Supine;Strengthening;AAROM(R AAROM) Quad Sets: Strengthening;Left;10 reps;Supine Gluteal Sets: Strengthening;Both;10 reps;Supine Short Arc Quad: Right;10 reps;Supine;Strengthening Hip ABduction/ADduction: Both;AAROM;Strengthening;10 reps;Supine(Heavy assist with L side.) Other Exercises Other Exercises: Education regarding importance of frequent exercise and movement related to recovery process and pain management. x4 min    General Comments        Pertinent Vitals/Pain Pain Assessment: Faces Pain Score: 8  Faces Pain Scale: Hurts little more Pain Location: L knee, R foot Pain Descriptors /  Indicators: Grimacing;Guarding;Moaning;Aching Pain Intervention(s): Limited activity within patient's tolerance    Home Living Family/patient expects to be discharged to:: Private residence Living Arrangements: Spouse/significant other Available Help at Discharge: Family;Neighbor;Personal care attendant Type of Home: House Home Access: Ramped entrance   Home Layout: One level Home Equipment: Walker - 2 wheels;Cane - single point;Bedside commode;Shower seat;Grab bars - tub/shower;Wheelchair - manual;Other (comment) Additional Comments: Pt reported that he spends most of his day in his recliner.    Prior Function  Level of Independence: Needs assistance  Gait / Transfers Assistance Needed: Pt able to ambulate [facility] bed to [facility] and to bedside commode with RW with supervision.  ADL's / Homemaking Assistance Needed: Assistance needed for ADLs, dependent for IADLs.     PT Goals (current goals can now be found in the care plan section) Acute Rehab PT Goals Patient Stated Goal: To get better again  Progress towards PT goals: PT to reassess next treatment    Frequency    BID      PT Plan Current plan remains appropriate    Co-evaluation   Reason for Co-Treatment: For patient/therapist safety;Complexity of the patient's impairments (multi-system involvement)   OT goals addressed during session: ADL's and self-care      AM-PAC PT "6 Clicks" Mobility   Outcome Measure  Help needed turning from your back to your side while in a flat bed without using bedrails?: A Lot Help needed moving from lying on your back to sitting on the side of a flat bed without using bedrails?: A Lot Help needed moving to and from a bed to a chair (including a wheelchair)?: Total Help needed standing up from a chair using your arms (e.g., wheelchair or bedside chair)?: Total Help needed to walk in hospital room?: Total Help needed climbing 3-5 steps with a railing? : Total 6 Click Score: 8    End of Session Equipment Utilized During Treatment: Left knee immobilizer Activity Tolerance: Patient limited by fatigue;Patient limited by pain;Patient limited by lethargy Patient left: in bed;with call bell/phone within reach;with bed alarm set   PT Visit Diagnosis: Unsteadiness on feet (R26.81);Muscle weakness (generalized) (M62.81);History of falling (Z91.81);Difficulty in walking, not elsewhere classified (R26.2);Pain Pain - Right/Left: Left Pain - part of body: Knee     Time: 1430-1453 PT Time Calculation (min) (ACUTE ONLY): 23 min  Charges:  $Therapeutic Exercise: 8-22 mins                     Dylan [name], PT,  DPT    Dylan [name] 08/29/2018, 4:21 PM

## 2018-08-29 NOTE — Progress Notes (Signed)
Advanced care plan. Purpose of the Encounter: CODE STATUS Parties in Attendance: Patient and family Patient's Decision Capacity: Good Subjective/Patient's story: Patient presented with left thigh and knee pain Objective/Medical story At the time of presentation to ER patient had periprosthetic fracture around internal prosthetic left knee joint Needs orthopedic intervention and procedure Patient underwent open reduction internal fixation supracondylar femur fracture above processes and quadriceps repair on the left side Goals of care determination:  Advance care directives goals of care treatment plan discussed Patient in agreement with all the procedure plans Wants everything done which includes CPR, ventilator if need arises CODE STATUS: Full  code Time spent discussing advanced care planning: 16 minutes

## 2018-08-29 NOTE — Progress Notes (Signed)
   Subjective: 2 Days Post-Op Procedure(s) (LRB): OPEN REDUCTION INTERNAL FIXATION (ORIF) SUPRACONDYLAR FEMUR FRACTURE ABOVE PROSTHESIS, QUADRICEPS REPAIR (Left) Patient reports pain as mild.   Patient is well, and has had no acute complaints or problems Denies any CP, SOB, ABD pain. We will continue therapy today, currently recommending SNF.  Objective: Vital signs in last 24 hours: Temp:  [98.2 F (36.8 C)-99.1 F (37.3 C)] 99.1 F (37.3 C) (12/28 0734) Pulse Rate:  [66-76] 76 (12/28 0734) Resp:  [18-19] 18 (12/28 0734) BP: (107-143)/(49-98) 143/80 (12/28 0734) SpO2:  [95 %-98 %] 97 % (12/28 0734)  Intake/Output from previous day: 12/27 0701 - 12/28 0700 In: 2608.3 [P.O.:960; I.V.:1078.3; Blood:320; IV Piggyback:250] Out: 850 [Urine:850] Intake/Output this shift: No intake/output data recorded.  Recent Labs    08/27/18 0353 08/27/18 1905 08/28/18 0330 08/28/18 1045 08/29/18 0453  HGB 9.1* 8.5* 7.2* 7.2* 8.4*   Recent Labs    08/28/18 1045 08/29/18 0453  WBC 7.4 7.4  RBC 2.30* 2.72*  HCT 22.9* 26.5*  PLT 117* 138*   Recent Labs    08/28/18 0330 08/29/18 0453  NA 139 137  K 4.0 3.9  CL 105 106  CO2 27 25  BUN 42* 38*  CREATININE 1.93* 1.60*  GLUCOSE 132* 144*  CALCIUM 7.6* 7.9*   No results for input(s): LABPT, INR in the last 72 hours.  EXAM General - Patient is Alert, Appropriate and Oriented Extremity - Neurovascular intact Sensation intact distally Intact pulses distally Dorsiflexion/Plantar flexion intact No cellulitis present Compartment soft Dressing - dressing C/D/I and no drainage, wound vac intact with minimal bloody output Bulky dressing removed today and honeycomb dressings applied to the lateral leg incisions. Intact to light touch to the left foot. Motor Function - intact, moving foot and toes well on exam.   Past Medical History:  Diagnosis Date  . CAD (coronary artery disease)   . Cervicalgia   . CHF (congestive heart  failure) (HCC)   . COPD (chronic obstructive pulmonary disease) (HCC)   . Diastolic heart failure (HCC)   . Foot drop, right   . History of kidney stones   . Hyperlipidemia    unspecified  . Hypertension   . Myocardial infarction (HCC)   . Osteoarthritis   . Shoulder pain, left   . Sleep apnea   . Tremor, essential    Assessment/Plan:   2 Days Post-Op Procedure(s) (LRB): OPEN REDUCTION INTERNAL FIXATION (ORIF) SUPRACONDYLAR FEMUR FRACTURE ABOVE PROSTHESIS, QUADRICEPS REPAIR (Left) Active Problems:   Periprosthetic fracture around internal prosthetic left knee joint  Estimated body mass index is 39.89 kg/m as calculated from the following:   Height as of this encounter: 5\' 10"  (1.778 m).   Weight as of this encounter: 126.1 kg. Advance diet Up with therapy, 20% weightbearing left leg, must wear knee immobilizer locked in extension.  No knee range of motion at this time.  Hg 8.4 this AM following 1 unit PRBC transfused yesterday. CBC and BMP ordered for tomorrow morning. Continue to work on having a BM.  DVT Prophylaxis - TED hose and SCDs, plavix, Aspirin  Valeria BatmanJ. Lance , PA-C [facility] 08/29/2018, 9:00 AM

## 2018-08-29 NOTE — Progress Notes (Signed)
Patient complaining of sweating, being hot, and short of breath. VSS, wife at bedside. Cool cloth applied. Patient states he feels better. Head of bed lowered per patient request. Call bell in reach. Continue to monitor.

## 2018-08-29 NOTE — Plan of Care (Signed)

## 2018-08-29 NOTE — Clinical Social Work Note (Signed)
[facility]. The patient can discharge once medically stable. CSW is following.  Argentina PonderKaren Martha Ezmeralda Stefanick, MSW, Theresia MajorsLCSWA [phone]

## 2018-08-29 NOTE — Progress Notes (Addendum)
[facility] [place] [facility]   PATIENT NAME: Dylan [name]    MR#:  [id number]  DATE OF BIRTH:  1936/01/01  SUBJECTIVE:  Patient seen and evaluated today morning No complaints of any lightheadedness No chest pain, shortness of breath Has some dysuria Pain better controlled  REVIEW OF SYSTEMS:    Review of Systems  Constitutional: Negative for fever, chills weight loss HENT: Negative for ear pain, nosebleeds, congestion, facial swelling, rhinorrhea, neck pain, neck stiffness and ear discharge.   Respiratory: Negative for cough, shortness of breath, wheezing  Cardiovascular: Negative for chest pain, palpitations and leg swelling.  Gastrointestinal: Negative for heartburn, abdominal pain, vomiting, diarrhea or consitpation Genitourinary: Negative for dysuria, urgency, frequency, hematuria Musculoskeletal: Negative for back pain Neurological: Negative for dizziness, seizures, syncope, focal weakness,  numbness and headaches.  Hematological: Does not bruise/bleed easily.  Psychiatric/Behavioral: Negative for hallucinations, confusion, dysphoric mood  Tolerating Diet: yes  DRUG ALLERGIES:  No Known Allergies  VITALS:  Blood pressure (!) 143/80, pulse 76, temperature 99.1 F (37.3 [name]), temperature source Oral, resp. rate 18, height 5\' 10"  (1.778 m), weight 126.1 kg, SpO2 97 %.  PHYSICAL EXAMINATION:  Constitutional: Appears well-developed and well-nourished. No distress. HENT: Normocephalic. Marland Kitchen. Oropharynx is clear and moist.  Eyes: Conjunctivae and EOM are normal. PERRLA, no scleral icterus.  Neck: Normal ROM. Neck supple. No JVD. No tracheal deviation. CVS: RRR, S1/S2 +, no murmurs, no gallops, no carotid bruit.  Pulmonary: Effort and breath sounds normal, no stridor, rhonchi, wheezes, rales.  Abdominal: Soft. BS +,  no distension, tenderness, rebound or guarding.  Musculoskeletal: Extremities with decreased swelling to left distal thigh, skin intact, healied  anterior knee incision.  Small bruised areas to great toe on left with dressing on right foot. Wound vac noted. Neuro: Alert. CN 2-12 grossly intact. No focal deficits. Skin: Skin is warm and dry. No rash noted. Psychiatric: Normal mood and affect.   LABORATORY PANEL:   CBC Recent Labs  Lab 08/29/18 0453  WBC 7.4  HGB 8.4*  HCT 26.5*  PLT 138*   ------------------------------------------------------------------------------------------------------------------  Chemistries  Recent Labs  Lab 08/26/18 0821  08/29/18 0453  NA 139   < > 137  K 3.9   < > 3.9  CL 103   < > 106  CO2 28   < > 25  GLUCOSE 136*   < > 144*  BUN 38*   < > 38*  CREATININE 1.95*   < > 1.60*  CALCIUM 8.2*   < > 7.9*  AST 38  --   --   ALT 37  --   --   ALKPHOS 73  --   --   BILITOT 0.9  --   --    < > = values in this interval not displayed.   ------------------------------------------------------------------------------------------------------------------  Cardiac Enzymes Recent Labs  Lab 08/26/18 0821  TROPONINI 0.07*   ------------------------------------------------------------------------------------------------------------------  RADIOLOGY:  Dg Knee 1-2 Views Left  Result Date: 08/27/2018 CLINICAL DATA:  ORIF of distal left femoral fracture. EXAM: LEFT KNEE - 1-2 VIEW COMPARISON:  August 26, 2018 FINDINGS: A plate has been affixed to the distal femur utilizing multiple screws. Patient is status post knee replacement. Postoperative soft tissue air noted. IMPRESSION: Continued distal femoral fracture. A plate now crosses the fracture line, affixed proximally and distally with multiple screws. Left knee replacement. Electronically Signed   By: Gerome Samavid  Williams III M.D   On: 08/27/2018 16:22   Dg [name]-arm 1-60 Min-no Report  Result Date: 08/27/2018 Fluoroscopy was utilized by the requesting physician.  No radiographic interpretation.     ASSESSMENT AND PLAN:   82 year old male with  history of CAD status post PCI of RCA x2 in October 2019 who presented to the emergency room after a fall with a fracture of left femur.   1.  Periprosthetic fracture around internal prosthetic left knee joint (LEFT SUPRACONDYLAR FEMUR FRACTURE) POD #2 ORIF Wound vac mgmt as per ortho team  2. Post operative anemia S/p prbc transfusion yesterday IV Hb stable today DVT prophylaxis Management as per orthopedic surgery.  3. Gram negative UTI Start patient on IV rocephin antibiotic  Follow up cultures and sensitivites  4.  CAD: Patient evaluated by cardiology and cleared for surgery Continue dual antiplatelet therapy given history of recent STEMI Continue aspirin, statin, Coreg, isosorbide Continue Plavix (watch hgb tomorrow)  5.  Chronic systolic heart failure ejection fraction 30 to 35% without signs of exacerbation Continue Coreg  6.  Essential hypertension: Continue isosorbide and Coreg  7. CKD stage 3: Creatinine is at baseline  8.  Tobacco abuse Tobacco cessation counseled for 6 minutes Nicotine patch offered but patient does not want it  Management plans discussed with the patient and he is in agreement.  CODE STATUS: Full  TOTAL TIME TAKING CARE OF THIS PATIENT: 34 minutes.     POSSIBLE D/[name]  to SNF in 1 to 2 days, DEPENDING ON CLINICAL CONDITION.   Dylan AustinPavan  M.D on 08/29/2018 at 11:41 AM  Between 7am to 6pm - Pager - [phone] After 6pm go to [email or web address] - password [email or web address]  [facility]  Office  [phone]  CC: Primary care physician; Dylan Shaggyate, Dylan C, MD  Note: This dictation was prepared with Dragon dictation along with smaller phrase technology. Any transcriptional errors that result from this process are unintentional.

## 2018-08-29 NOTE — Progress Notes (Signed)
Physical Therapy Treatment Patient Details Name: Dylan [name] MRN: [id number] DOB: Aug 26, 1936 Today's Date: 08/29/2018    History of Present Illness Pt is 82 yo male s/p ORIF supracondylar femur fx above prosthesis, quadriceps repair. post PCI of RCA x2 in October 2019 who presented to the emergency room after a fall, fracture of left femur. PMH of HTN, beign essential tremor, lymphedema, COPD, CHF, R foot drop    PT Comments    Pt demonstrated progress with sitting posture today and participation in supine there ex.  He becomes fatigued very quickly and requires heavy VC's for body mechanics during mobility.  3+ assistance was needed to get pt to EOB and to return to bed with therapist supporting L LE with knee immobilizer donned.  Pt very fearful and focused on knee pain.  PT educated pt regarding WB status of L LE and pt was reassured.  Pt required assistance with all there ex, with best participation being during L LE quad sets. Pt expressed understanding of all exercises.  Pt will continue to benefit from skilled PT with focus on strength, tolerance to activity, pain management and prevention of future falls.  Follow Up Recommendations  SNF     Equipment Recommendations  Other (comment)    Recommendations for Other Services       Precautions / Restrictions Precautions Precautions: Fall;Knee Required Braces or Orthoses: Knee Immobilizer - Left Knee Immobilizer - Left: On at all times;Other (comment)(locked in extension) Restrictions Weight Bearing Restrictions: Yes LLE Weight Bearing: Partial weight bearing LLE Partial Weight Bearing Percentage or Pounds: 20 Other Position/Activity Restrictions: knee brace locked in extension at all times, no ROM exercises on LLE     Mobility  Bed Mobility Overal bed mobility: Needs Assistance Bed Mobility: Supine to Sit;Sit to Supine     Supine to sit: Max assist;+2 for physical assistance;HOB elevated Sit to supine: Max assist;+2 for  physical assistance;HOB elevated   General bed mobility comments: Pt able to initate movements, but maxAx3 to achieve transfer. Repositioned in bed with maxAx3 as well. Pt encouraged to activate abdominal muscles and "lean forward" which pt was able to do once sitting upright.  Transfers                 General transfer comment: unable to attempt at this time  Ambulation/Gait                 Stairs             Wheelchair Mobility    Modified Rankin (Stroke Patients Only)       Balance                                            Cognition Arousal/Alertness: Lethargic Behavior During Therapy: WFL for tasks assessed/performed Overall Cognitive Status: Within Functional Limits for tasks assessed                                        Exercises General Exercises - Lower Extremity Ankle Circles/Pumps: Both;10 reps;Supine;Strengthening;AAROM(R AAROM) Quad Sets: Strengthening;Left;10 reps;Supine Gluteal Sets: Strengthening;Both;10 reps;Supine Short Arc Quad: Right;10 reps;Supine;Strengthening Hip ABduction/ADduction: Both;AAROM;Strengthening;10 reps;Supine(Heavy assist with L side.) Other Exercises Other Exercises: Pt sat at EOB for 5-7 min with frequent VC's to lean anteriorly.  Pt was eventually  able to sit upright with SBA and less frequent VC's.  Pt declined attempt to STS.    General Comments        Pertinent Vitals/Pain Pain Assessment: 0-10 Pain Score: 8  Pain Location: L knee Pain Intervention(s): Monitored during session;Limited activity within patient's tolerance    Home Living                      Prior Function            PT Goals (current goals can now be found in the care plan section) Progress towards PT goals: Progressing toward goals    Frequency    BID      PT Plan Current plan remains appropriate    Co-evaluation              AM-PAC PT "6 Clicks" Mobility   Outcome  Measure  Help needed turning from your back to your side while in a flat bed without using bedrails?: A Lot Help needed moving from lying on your back to sitting on the side of a flat bed without using bedrails?: A Lot Help needed moving to and from a bed to a chair (including a wheelchair)?: Total Help needed standing up from a chair using your arms (e.g., wheelchair or bedside chair)?: Total Help needed to walk in hospital room?: Total Help needed climbing 3-5 steps with a railing? : Total 6 Click Score: 8    End of Session Equipment Utilized During Treatment: Left knee immobilizer Activity Tolerance: Patient limited by fatigue;Patient limited by pain Patient left: in bed;with call bell/phone within reach;with bed alarm set;with family/visitor present;with SCD's reapplied Nurse Communication: Patient requests pain meds PT Visit Diagnosis: Unsteadiness on feet (R26.81);Muscle weakness (generalized) (M62.81);History of falling (Z91.81);Difficulty in walking, not elsewhere classified (R26.2);Pain Pain - Right/Left: Left Pain - part of body: Knee     Time: [id number] PT Time Calculation (min) (ACUTE ONLY): 36 min  Charges:  $Therapeutic Exercise: 8-22 mins $Therapeutic Activity: 8-22 mins                     [name], PT, DPT    [name] 08/29/2018, 11:44 AM

## 2018-08-30 ENCOUNTER — Inpatient Hospital Stay: Payer: Medicare Other

## 2018-08-30 LAB — CBC
HCT: 28 % — ABNORMAL LOW (ref 39.0–52.0)
Hemoglobin: 8.8 g/dL — ABNORMAL LOW (ref 13.0–17.0)
MCH: 30.6 pg (ref 26.0–34.0)
MCHC: 31.4 g/dL (ref 30.0–36.0)
MCV: 97.2 fL (ref 80.0–100.0)
Platelets: 150 10*3/uL (ref 150–400)
RBC: 2.88 MIL/uL — ABNORMAL LOW (ref 4.22–5.81)
RDW: 16.7 % — ABNORMAL HIGH (ref 11.5–15.5)
WBC: 8.4 10*3/uL (ref 4.0–10.5)
nRBC: 0.2 % (ref 0.0–0.2)

## 2018-08-30 LAB — BASIC METABOLIC PANEL
Anion gap: 7 (ref 5–15)
BUN: 37 mg/dL — ABNORMAL HIGH (ref 8–23)
CO2: 24 mmol/L (ref 22–32)
Calcium: 8.2 mg/dL — ABNORMAL LOW (ref 8.9–10.3)
Chloride: 108 mmol/L (ref 98–111)
Creatinine, Ser: 1.42 mg/dL — ABNORMAL HIGH (ref 0.61–1.24)
GFR calc Af Amer: 54 mL/min — ABNORMAL LOW (ref >60)
GFR calc non Af Amer: 46 mL/min — ABNORMAL LOW (ref >60)
GLUCOSE: 134 mg/dL — AB (ref 70–99)
Potassium: 4.2 mmol/L (ref 3.5–5.1)
Sodium: 139 mmol/L (ref 135–145)

## 2018-08-30 LAB — URINE CULTURE: Culture: >=60000 — AB

## 2018-08-30 MED ORDER — VANCOMYCIN HCL IN DEXTROSE 1-5 GM/200ML-% IV SOLN: 1000.0000 mg | INTRAVENOUS | Status: DC

## 2018-08-30 MED ORDER — HYDROCODONE-ACETAMINOPHEN 5-325 MG PO TABS
1.0000 | ORAL_TABLET | ORAL | Status: DC | PRN
  Administered 2018-08-31 – 2018-09-03 (×4): 1 via ORAL
  Filled 2018-08-30 (×4): qty 1

## 2018-08-30 MED ORDER — PIPERACILLIN-TAZOBACTAM 3.375 G IVPB
3.3750 g | Freq: Three times a day (TID) | INTRAVENOUS | Status: AC
  Administered 2018-08-30 – 2018-09-03 (×14): 3.375 g via INTRAVENOUS
  Filled 2018-08-30 (×15): qty 50

## 2018-08-30 MED ORDER — VANCOMYCIN HCL 10 G IV SOLR
1250.0000 mg | Freq: Two times a day (BID) | INTRAVENOUS | Status: DC
  Administered 2018-08-30 – 2018-08-31 (×2): 1250 mg via INTRAVENOUS
  Filled 2018-08-30 (×3): qty 1250

## 2018-08-30 MED ORDER — FUROSEMIDE 10 MG/ML IJ SOLN
40.0000 mg | INTRAMUSCULAR | Status: AC
  Administered 2018-08-30: 40 mg via INTRAVENOUS
  Filled 2018-08-30: qty 4

## 2018-08-30 MED ORDER — ACETAMINOPHEN 325 MG PO TABS
650.0000 mg | ORAL_TABLET | Freq: Four times a day (QID) | ORAL | Status: DC | PRN
  Administered 2018-08-30 – 2018-08-31 (×4): 650 mg via ORAL
  Filled 2018-08-30 (×4): qty 2

## 2018-08-30 MED ORDER — VANCOMYCIN HCL 10 G IV SOLR
1250.0000 mg | Freq: Once | INTRAVENOUS | Status: AC
  Administered 2018-08-30: 1250 mg via INTRAVENOUS
  Filled 2018-08-30: qty 1250

## 2018-08-30 NOTE — Progress Notes (Addendum)
[facility] [place] [facility]   PATIENT NAME: Dylan [name]    MR#:  [id number]  DATE OF BIRTH:  1936/04/11  SUBJECTIVE:  Patient seen and evaluated today morning Patient appears lethargic this morning No fever No chest pain, shortness of breath Has  dysuria Pain better controlled Urine culture growing Enterococcus faecalis and Klebsiella Antibiotics will be switched based on sensitivities Discussed with pharmacy  REVIEW OF SYSTEMS:    Review of Systems  Constitutional: Negative for fever, chills weight loss HENT: Negative for ear pain, nosebleeds, congestion, facial swelling, rhinorrhea, neck pain, neck stiffness and ear discharge.   Respiratory: Negative for cough, shortness of breath, wheezing  Cardiovascular: Negative for chest pain, palpitations and leg swelling.  Gastrointestinal: Negative for heartburn, abdominal pain, vomiting, diarrhea or consitpation Genitourinary: Negative for dysuria, urgency, frequency, hematuria Musculoskeletal: Negative for back pain Neurological: Negative for dizziness, seizures, syncope, focal weakness,  numbness and headaches.  Hematological: Does not bruise/bleed easily.  Psychiatric/Behavioral: Negative for hallucinations, confusion, dysphoric mood  Tolerating Diet: yes  DRUG ALLERGIES:  No Known Allergies  VITALS:  Blood pressure (!) 176/77, pulse 79, temperature 99 F (37.2 C), temperature source Oral, resp. rate (!) 22, height 5\' 10"  (1.778 m), weight (!) 175.5 kg, SpO2 95 %.  PHYSICAL EXAMINATION:  Constitutional: Appears well-developed and well-nourished. No distress. HENT: Normocephalic. Marland Kitchen. Oropharynx is clear and moist.  Eyes: Conjunctivae and EOM are normal. PERRLA, no scleral icterus.  Neck: Normal ROM. Neck supple. No JVD. No tracheal deviation. CVS: RRR, S1/S2 +, no murmurs, no gallops, no carotid bruit.  Pulmonary: Effort and breath sounds normal, no stridor, rhonchi, wheezes, rales.  Abdominal: Soft.  BS +,  no distension, tenderness, rebound or guarding.  Musculoskeletal: Extremities with decreased swelling to left distal thigh, skin intact, healied anterior knee incision.  Small bruised areas to great toe on left with dressing on right foot. Wound vac noted. Neuro: Alert. CN 2-12 grossly intact. No focal deficits. Skin: Skin is warm and dry. No rash noted. Psychiatric: Normal mood and affect.   LABORATORY PANEL:   CBC Recent Labs  Lab 08/30/18 0440  WBC 8.4  HGB 8.8*  HCT 28.0*  PLT 150   ------------------------------------------------------------------------------------------------------------------  Chemistries  Recent Labs  Lab 08/26/18 0821  08/30/18 0440  NA 139   < > 139  K 3.9   < > 4.2  CL 103   < > 108  CO2 28   < > 24  GLUCOSE 136*   < > 134*  BUN 38*   < > 37*  CREATININE 1.95*   < > 1.42*  CALCIUM 8.2*   < > 8.2*  AST 38  --   --   ALT 37  --   --   ALKPHOS 73  --   --   BILITOT 0.9  --   --    < > = values in this interval not displayed.   ------------------------------------------------------------------------------------------------------------------  Cardiac Enzymes Recent Labs  Lab 08/26/18 0821  TROPONINI 0.07*   ------------------------------------------------------------------------------------------------------------------  RADIOLOGY:  No results found.   ASSESSMENT AND PLAN:   82 year old male with history of CAD status post PCI of RCA x2 in October 2019 who presented to the emergency room after a fall with a fracture of left femur.   1.  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 Health care associated pneumonia with effusion Start patient  on IV Vancomycin and IV zosyn abx IV lasix for diuresis  3.  Enterococcus faecalis and Klebsiella oxytoca urinary tract infection Discontinue IV Rocephin antibiotic Start patient  on IV Zosyn antibiotic based on culture and sensitivity Infectious disease consultation  4.  CAD: Patient evaluated by cardiology and cleared for surgery Continue dual antiplatelet therapy given history of recent STEMI Continue aspirin, statin, Coreg, isosorbide Continue Plavix (watch hgb tomorrow)  5.  Chronic systolic heart failure ejection fraction 30 to 35% without signs of exacerbation Continue Coreg  6.  Essential hypertension: Continue isosorbide and Coreg  7. CKD stage 3: Creatinine is at baseline  8. Post operative anemia S/p prbc transfusion Hemoglobin stable  8.  Tobacco abuse Tobacco cessation counseled for 6 minutes Nicotine patch offered but patient does not want it  9.Hypoxia Continue oxygen via nasal canula  Management plans discussed with the patient and he is in agreement.  CODE STATUS: Full  TOTAL TIME TAKING CARE OF THIS PATIENT: 42 minutes.     POSSIBLE D/C  to SNF in 1 to 2 days, DEPENDING ON CLINICAL CONDITION.   Ihor AustinPavan  M.D on 08/30/2018 at 11:49 AM  Between 7am to 6pm - Pager - [phone] After 6pm go to [email or web address] - password [email or web address]  [facility]  Office  [phone]  CC: Primary care physician; Jaclyn Shaggyate, [name], MD  Note: This dictation was prepared with Dragon dictation along with smaller phrase technology. Any transcriptional errors that result from this process are unintentional.

## 2018-08-30 NOTE — Plan of Care (Signed)

## 2018-08-30 NOTE — Progress Notes (Signed)
Pharmacy Antibiotic Note  Dylan [name] is a [age] y.o. male admitted on 08/26/2018 with pneumonia.  Pharmacy has been consulted for Vancomycin dosing.  Plan:  Vancomycin 1250 mg IV X 1 ordered for 12/29 @ 1600. Vancomycin 1250 mg IV Q12H ordered to start on 12/29 @ 2200, ~ 6 hrs after 1st dose (stacked dosing).  Pt will reach Css by 1/1 @ 0400. Pt is at risk for accumulation, will draw 1st trough on 12/31 @ 0930, which will not be at Css.  AdjBW = 114 kg  CrCl = 66.9 ml/min Ke = 0.06 hr-1 T1/2 =  11.6  Hrs Vd = 79.8 L  Goal trough :  15 - 20 mcg/mL  Height: 5\' 10"  (177.8 cm) Weight: (!) 387 lb (175.5 kg) IBW/kg (Calculated) : 73  Temp (24hrs), Avg:98.8 F (37.1 C), Min:98.6 F (37 C), Max:99 F (37.2 C)  Recent Labs  Lab 08/26/18 0821 08/27/18 0353 08/28/18 0330 08/28/18 1045 08/29/18 0453 08/30/18 0440  WBC 9.7 7.7 7.3 7.4 7.4 8.4  CREATININE 1.95* 2.04* 1.93*  --  1.60* 1.42*    Estimated Creatinine Clearance: 66.9 mL/min (A) (by C-G formula based on SCr of 1.42 mg/dL (H)).    No Known Allergies  Antimicrobials this admission:   >>   >>  Dose adjustments this admission:   Microbiology results:  BCx:    UCx:    Sputum:    MRSA PCR:   Thank you for allowing pharmacy to be a part of this patient's care.  Dylan [name] D 08/30/2018 3:51 PM

## 2018-08-30 NOTE — Progress Notes (Signed)
   Subjective: 3 Days Post-Op Procedure(s) (LRB): OPEN REDUCTION INTERNAL FIXATION (ORIF) SUPRACONDYLAR FEMUR FRACTURE ABOVE PROSTHESIS, QUADRICEPS REPAIR (Left) Patient reports pain as mild in the left leg. Drowsy this AM.  On 3L of O2.  Patient is well, and has had no acute complaints or problems Denies any CP, SOB, ABD pain. We will continue therapy today, currently recommending SNF.  Objective: Vital signs in last 24 hours: Temp:  [98.6 F (37 C)-99 F (37.2 C)] 99 F (37.2 C) (12/29 0723) Pulse Rate:  [66-79] 79 (12/29 0723) Resp:  [18-22] 22 (12/29 0723) BP: (122-176)/(60-86) 176/77 (12/29 0723) SpO2:  [95 %-99 %] 95 % (12/29 0723) Weight:  [175.5 kg] 175.5 kg (12/29 0500)  Intake/Output from previous day: 12/28 0701 - 12/29 0700 In: 606.3 [P.O.:480; I.V.:26.3; IV Piggyback:100] Out: 600 [Urine:600] Intake/Output this shift: No intake/output data recorded.  Recent Labs    08/27/18 1905 08/28/18 0330 08/28/18 1045 08/29/18 0453 08/30/18 0440  HGB 8.5* 7.2* 7.2* 8.4* 8.8*   Recent Labs    08/29/18 0453 08/30/18 0440  WBC 7.4 8.4  RBC 2.72* 2.88*  HCT 26.5* 28.0*  PLT 138* 150   Recent Labs    08/29/18 0453 08/30/18 0440  NA 137 139  K 3.9 4.2  CL 106 108  CO2 25 24  BUN 38* 37*  CREATININE 1.60* 1.42*  GLUCOSE 144* 134*  CALCIUM 7.9* 8.2*   No results for input(s): LABPT, INR in the last 72 hours.  EXAM General - Patient is Appropriate and Oriented Extremity - Neurovascular intact Sensation intact distally Intact pulses distally Dorsiflexion/Plantar flexion intact No cellulitis present Compartment soft Dressing - dressing C/D/I and no drainage, wound vac intact with minimal bloody output Honeycomb dressings intact with moderate serosanguinous drainage. Motor Function - intact, moving foot and toes well on exam.   Past Medical History:  Diagnosis Date  . CAD (coronary artery disease)   . Cervicalgia   . CHF (congestive heart failure)  (HCC)   . COPD (chronic obstructive pulmonary disease) (HCC)   . Diastolic heart failure (HCC)   . Foot drop, right   . History of kidney stones   . Hyperlipidemia    unspecified  . Hypertension   . Myocardial infarction (HCC)   . Osteoarthritis   . Shoulder pain, left   . Sleep apnea   . Tremor, essential    Assessment/Plan:   3 Days Post-Op Procedure(s) (LRB): OPEN REDUCTION INTERNAL FIXATION (ORIF) SUPRACONDYLAR FEMUR FRACTURE ABOVE PROSTHESIS, QUADRICEPS REPAIR (Left) Active Problems:   Periprosthetic fracture around internal prosthetic left knee joint  Estimated body mass index is 55.53 kg/m as calculated from the following:   Height as of this encounter: 5\' 10"  (1.778 m).   Weight as of this encounter: 175.5 kg. Advance diet Up with therapy, 20% weightbearing left leg, must wear knee immobilizer locked in extension.  No knee range of motion at this time.  Hg 8.8 this AM.  Up with therapy today. Continue to monitor mental status, able to answer all my questions this AM but falling asleep while talking. CBC and BMP ordered for tomorrow morning. Continue to work on having a BM.  DVT Prophylaxis - TED hose and SCDs, plavix, Aspirin  Valeria BatmanJ. Lance McGhee, PA-C [facility] 08/30/2018, 7:47 AM

## 2018-08-30 NOTE — Progress Notes (Signed)
Physical Therapy Treatment Patient Details Name: Dylan [name] MRN: [id number] DOB: 1936-03-09 Today's Date: 08/30/2018    History of Present Illness Pt is 82 yo male s/p ORIF supracondylar femur fx above prosthesis, quadriceps repair. post PCI of RCA x2 in October 2019 who presented to the emergency room after a fall, fracture of left femur. PMH of HTN, beign essential tremor, lymphedema, COPD, CHF, R foot drop    PT Comments    Patient able to tolerate inc EOB time this session with cuing and occasional physical assistance to prevent posterior LOB. Patient is able to initiate SLR in sitting with PT assistance at the end of range, which is also an improvement. Through EOB sitting pt complains of pain "all over" from falls, esp in his back. PT educated patient that pain is normal and inc time spent supine can increase back pain. Patient is able to complete therex, but remains lethargic and pain focused throughout session.    Follow Up Recommendations  SNF     Equipment Recommendations  Other (comment)    Recommendations for Other Services       Precautions / Restrictions Precautions Precautions: Fall;Knee Required Braces or Orthoses: Knee Immobilizer - Left Knee Immobilizer - Left: On at all times;Other (comment) Restrictions Weight Bearing Restrictions: Yes LLE Weight Bearing: Partial weight bearing LLE Partial Weight Bearing Percentage or Pounds: 20 Other Position/Activity Restrictions: knee brace locked in extension at all times, no ROM exercises on LLE     Mobility  Bed Mobility Overal bed mobility: Needs Assistance Bed Mobility: Supine to Sit;Sit to Supine     Supine to sit: Max assist;+2 for physical assistance;HOB elevated Sit to supine: Max assist;+2 for physical assistance;HOB elevated   General bed mobility comments: Pt able to initate movements and attempt to use UE to pull up with bedrail, but maxAx2 to achieve SIT<> SUPINE. Repositioned in bed with maxAx2 as  well. Sitting EOB patient is able to support himself with bilat UE and feet supported with CGA-modA  Transfers                 General transfer comment: unable to attempt at this time  Ambulation/Gait                 Stairs             Wheelchair Mobility    Modified Rankin (Stroke Patients Only)       Balance                                            Cognition Arousal/Alertness: Lethargic Behavior During Therapy: WFL for tasks assessed/performed Overall Cognitive Status: Within Functional Limits for tasks assessed                                 General Comments: Very lethargic, closing eyes throughout session, reports closing his eyes "helps the pain"      Exercises General Exercises - Lower Extremity Ankle Circles/Pumps: Both;Supine;Strengthening;AAROM;20 reps Quad Sets: Strengthening;Left;Supine;10 reps Gluteal Sets: Strengthening;Both;10 reps;Supine Short Arc Quad: Right;Supine;Strengthening;15 reps Hip ABduction/ADduction: Both;Strengthening;Supine;15 reps;AROM(in sitting) Straight Leg Raises: Strengthening;Right;10 reps;AAROM(in sitting) Other Exercises Other Exercises: EOB sitting 10min during therex. Consistent cuing for ant lean to prevent post LOB and to sit modI with CGA vs. needing PT assistance to remain sitting. Patient able  to comply 50%    General Comments        Pertinent Vitals/Pain Pain Assessment: Faces Faces Pain Scale: Hurts even more Pain Location: L knee, back Pain Descriptors / Indicators: Grimacing;Guarding;Moaning;Aching Pain Intervention(s): Limited activity within patient's tolerance    Home Living                      Prior Function            PT Goals (current goals can now be found in the care plan section) Acute Rehab PT Goals Patient Stated Goal: To get better again  PT Goal Formulation: With patient Time For Goal Achievement: 09/11/18 Potential to Achieve  Goals: Good Progress towards PT goals: Progressing toward goals    Frequency    BID      PT Plan Current plan remains appropriate    Co-evaluation              AM-PAC PT "6 Clicks" Mobility   Outcome Measure  Help needed turning from your back to your side while in a flat bed without using bedrails?: A Lot Help needed moving from lying on your back to sitting on the side of a flat bed without using bedrails?: A Lot Help needed moving to and from a bed to a chair (including a wheelchair)?: Total Help needed standing up from a chair using your arms (e.g., wheelchair or bedside chair)?: Total Help needed to walk in hospital room?: Total Help needed climbing 3-5 steps with a railing? : Total 6 Click Score: 8    End of Session Equipment Utilized During Treatment: Left knee immobilizer Activity Tolerance: Patient limited by fatigue;Patient limited by pain;Patient limited by lethargy Patient left: in bed;with call bell/phone within reach;with bed alarm set   PT Visit Diagnosis: Unsteadiness on feet (R26.81);Muscle weakness (generalized) (M62.81);History of falling (Z91.81);Difficulty in walking, not elsewhere classified (R26.2);Pain Pain - Right/Left: Left Pain - part of body: Knee     Time: [id number] PT Time Calculation (min) (ACUTE ONLY): 21 min  Charges:  $Therapeutic Exercise: 8-22 mins                     Staci Acostahelsea Miller PT, DPT   Staci Acostahelsea Miller 08/30/2018, 10:03 AM

## 2018-08-31 DIAGNOSIS — I2510 Atherosclerotic heart disease of native coronary artery without angina pectoris: Secondary | ICD-10-CM

## 2018-08-31 DIAGNOSIS — Z955 Presence of coronary angioplasty implant and graft: Secondary | ICD-10-CM

## 2018-08-31 DIAGNOSIS — Z7982 Long term (current) use of aspirin: Secondary | ICD-10-CM

## 2018-08-31 DIAGNOSIS — Z79899 Other long term (current) drug therapy: Secondary | ICD-10-CM

## 2018-08-31 DIAGNOSIS — Z96652 Presence of left artificial knee joint: Secondary | ICD-10-CM

## 2018-08-31 DIAGNOSIS — I252 Old myocardial infarction: Secondary | ICD-10-CM

## 2018-08-31 DIAGNOSIS — N390 Urinary tract infection, site not specified: Secondary | ICD-10-CM

## 2018-08-31 DIAGNOSIS — B961 Klebsiella pneumoniae [K. pneumoniae] as the cause of diseases classified elsewhere: Secondary | ICD-10-CM

## 2018-08-31 DIAGNOSIS — S72492A Other fracture of lower end of left femur, initial encounter for closed fracture: Secondary | ICD-10-CM

## 2018-08-31 DIAGNOSIS — F17211 Nicotine dependence, cigarettes, in remission: Secondary | ICD-10-CM

## 2018-08-31 DIAGNOSIS — W19XXXA Unspecified fall, initial encounter: Secondary | ICD-10-CM

## 2018-08-31 DIAGNOSIS — Z7902 Long term (current) use of antithrombotics/antiplatelets: Secondary | ICD-10-CM

## 2018-08-31 DIAGNOSIS — I5030 Unspecified diastolic (congestive) heart failure: Secondary | ICD-10-CM

## 2018-08-31 DIAGNOSIS — B952 Enterococcus as the cause of diseases classified elsewhere: Secondary | ICD-10-CM

## 2018-08-31 LAB — CBC
HCT: 27.9 % — ABNORMAL LOW (ref 39.0–52.0)
HEMOGLOBIN: 8.7 g/dL — AB (ref 13.0–17.0)
MCH: 30.9 pg (ref 26.0–34.0)
MCHC: 31.2 g/dL (ref 30.0–36.0)
MCV: 98.9 fL (ref 80.0–100.0)
Platelets: 163 10*3/uL (ref 150–400)
RBC: 2.82 MIL/uL — ABNORMAL LOW (ref 4.22–5.81)
RDW: 16.5 % — AB (ref 11.5–15.5)
WBC: 11.4 10*3/uL — ABNORMAL HIGH (ref 4.0–10.5)
nRBC: 0 % (ref 0.0–0.2)

## 2018-08-31 LAB — BASIC METABOLIC PANEL
Anion gap: 9 (ref 5–15)
BUN: 35 mg/dL — ABNORMAL HIGH (ref 8–23)
CALCIUM: 8.1 mg/dL — AB (ref 8.9–10.3)
CO2: 24 mmol/L (ref 22–32)
Chloride: 108 mmol/L (ref 98–111)
Creatinine, Ser: 1.25 mg/dL — ABNORMAL HIGH (ref 0.61–1.24)
GFR calc Af Amer: >60 mL/min (ref >60)
GFR calc non Af Amer: 54 mL/min — ABNORMAL LOW (ref >60)
Glucose, Bld: 139 mg/dL — ABNORMAL HIGH (ref 70–99)
Potassium: 3.9 mmol/L (ref 3.5–5.1)
Sodium: 141 mmol/L (ref 135–145)

## 2018-08-31 LAB — PROCALCITONIN: Procalcitonin: <0.1 ng/mL

## 2018-08-31 MED ORDER — KETOTIFEN FUMARATE 0.025 % OP SOLN
1.0000 [drp] | Freq: Two times a day (BID) | OPHTHALMIC | Status: DC
  Administered 2018-08-31 – 2018-09-03 (×6): 1 [drp] via OPHTHALMIC
  Filled 2018-08-31: qty 5

## 2018-08-31 MED ORDER — TORSEMIDE 20 MG PO TABS
20.0000 mg | ORAL_TABLET | Freq: Every day | ORAL | Status: DC
  Administered 2018-08-31 – 2018-09-01 (×2): 20 mg via ORAL
  Filled 2018-08-31 (×2): qty 1

## 2018-08-31 MED ORDER — LORATADINE 10 MG PO TABS
10.0000 mg | ORAL_TABLET | Freq: Every day | ORAL | Status: DC
  Administered 2018-08-31 – 2018-09-04 (×5): 10 mg via ORAL
  Filled 2018-08-31 (×5): qty 1

## 2018-08-31 MED ORDER — GUAIFENESIN ER 600 MG PO TB12
600.0000 mg | ORAL_TABLET | Freq: Two times a day (BID) | ORAL | Status: DC | PRN
  Administered 2018-09-01: 600 mg via ORAL
  Filled 2018-08-31: qty 1

## 2018-08-31 MED ORDER — IPRATROPIUM-ALBUTEROL 0.5-2.5 (3) MG/3ML IN SOLN: 3.0000 mL | Freq: Four times a day (QID) | RESPIRATORY_TRACT | Status: DC | PRN

## 2018-08-31 MED ORDER — DEXTROMETHORPHAN POLISTIREX ER 30 MG/5ML PO SUER
30.0000 mg | Freq: Two times a day (BID) | ORAL | Status: DC | PRN
  Administered 2018-08-31 – 2018-09-01 (×2): 30 mg via ORAL
  Filled 2018-08-31 (×3): qty 5

## 2018-08-31 MED ORDER — SODIUM CHLORIDE 0.9 % IV SOLN
INTRAVENOUS | Status: DC | PRN
  Administered 2018-08-31: 250 mL via INTRAVENOUS

## 2018-08-31 MED ORDER — GUAIFENESIN ER 600 MG PO TB12: 600.0000 mg | ORAL_TABLET | Freq: Two times a day (BID) | ORAL | Status: DC | PRN

## 2018-08-31 MED ORDER — SPIRONOLACTONE 25 MG PO TABS
12.5000 mg | ORAL_TABLET | Freq: Every day | ORAL | Status: DC
  Administered 2018-08-31 – 2018-09-04 (×5): 12.5 mg via ORAL
  Filled 2018-08-31: qty 0.5
  Filled 2018-08-31: qty 1
  Filled 2018-08-31 (×3): qty 0.5
  Filled 2018-08-31 (×3): qty 1
  Filled 2018-08-31: qty 0.5

## 2018-08-31 MED ORDER — DEXTROMETHORPHAN POLISTIREX ER 30 MG/5ML PO SUER
30.0000 mg | Freq: Two times a day (BID) | ORAL | Status: DC | PRN
  Filled 2018-08-31: qty 5

## 2018-08-31 MED ORDER — SACUBITRIL-VALSARTAN 24-26 MG PO TABS
1.0000 | ORAL_TABLET | Freq: Two times a day (BID) | ORAL | Status: DC
  Administered 2018-08-31 – 2018-09-04 (×8): 1 via ORAL
  Filled 2018-08-31 (×9): qty 1

## 2018-08-31 MED ORDER — TAMSULOSIN HCL 0.4 MG PO CAPS
0.4000 mg | ORAL_CAPSULE | Freq: Every day | ORAL | Status: DC
  Administered 2018-08-31 – 2018-09-04 (×5): 0.4 mg via ORAL
  Filled 2018-08-31 (×5): qty 1

## 2018-08-31 MED ORDER — POTASSIUM CHLORIDE CRYS ER 10 MEQ PO TBCR
10.0000 meq | EXTENDED_RELEASE_TABLET | Freq: Every day | ORAL | Status: DC
  Administered 2018-08-31 – 2018-09-01 (×2): 10 meq via ORAL
  Filled 2018-08-31 (×2): qty 1

## 2018-08-31 MED ORDER — GABAPENTIN 400 MG PO CAPS
400.0000 mg | ORAL_CAPSULE | Freq: Every day | ORAL | Status: DC
  Administered 2018-08-31 – 2018-09-03 (×4): 400 mg via ORAL
  Filled 2018-08-31 (×4): qty 1

## 2018-08-31 NOTE — Progress Notes (Signed)
Per medicine MD patient is not stable for D/C today. [facility] and patient can have a private room due to isolation. Ladona Ridgelaylor is aware that patient may need IV ABX. Per Ladona Ridgelaylor D/C summary is needed by 11:30 am on 09/01/18 due to the holiday. Ortho PA is aware of above. Clinical Social Worker (CSW) contacted patient's wife Eber JonesCarolyn and made her aware of above.   Baker Hughes IncorporatedBailey Shemekia Patane, LCSW [phone]

## 2018-08-31 NOTE — Plan of Care (Signed)
?  Problem: Education: ?Goal: Knowledge of General Education information will improve ?Description: Including pain rating scale, medication(s)/side effects and non-pharmacologic comfort measures ?Outcome: Progressing ?  ?Problem: Health Behavior/Discharge Planning: ?Goal: Ability to manage health-related needs will improve ?Outcome: Progressing ?  ?Problem: Clinical Measurements: ?Goal: Ability to maintain clinical measurements within normal limits will improve ?Outcome: Progressing ?Goal: Will remain free from infection ?Outcome: Progressing ?Goal: Diagnostic test results will improve ?Outcome: Progressing ?Goal: Respiratory complications will improve ?Outcome: Progressing ?Goal: Cardiovascular complication will be avoided ?Outcome: Progressing ?  ?Problem: Activity: ?Goal: Risk for activity intolerance will decrease ?Outcome: Progressing ?  ?Problem: Nutrition: ?Goal: Adequate nutrition will be maintained ?Outcome: Progressing ?  ?Problem: Elimination: ?Goal: Will not experience complications related to bowel motility ?Outcome: Progressing ?Goal: Will not experience complications related to urinary retention ?Outcome: Progressing ?  ?Problem: Coping: ?Goal: Level of anxiety will decrease ?Outcome: Progressing ?  ?

## 2018-08-31 NOTE — Progress Notes (Signed)
   Subjective: 4 Days Post-Op Procedure(s) (LRB): OPEN REDUCTION INTERNAL FIXATION (ORIF) SUPRACONDYLAR FEMUR FRACTURE ABOVE PROSTHESIS, QUADRICEPS REPAIR (Left) Patient reports pain as mild in the left leg. Patient is well, and has had no acute complaints or problems Denies any CP, SOB, ABD pain. We will continue therapy today, currently recommending SNF.  Objective: Vital signs in last 24 hours: Temp:  [97.9 F (36.6 C)-98.2 F (36.8 C)] 97.9 F (36.6 C) (12/29 2330) Pulse Rate:  [70-74] 70 (12/29 2330) Resp:  [19-28] 19 (12/29 2330) BP: (148-151)/(74-85) 148/85 (12/29 2330) SpO2:  [100 %] 100 % (12/29 2330) Weight:  [176.2 kg] 176.2 kg (12/30 0500)  Intake/Output from previous day: 12/29 0701 - 12/30 0700 In: 138.6 [IV Piggyback:138.6] Out: 150 [Urine:150] Intake/Output this shift: No intake/output data recorded.  Recent Labs    08/28/18 1045 08/29/18 0453 08/30/18 0440 08/31/18 0332  HGB 7.2* 8.4* 8.8* 8.7*   Recent Labs    08/30/18 0440 08/31/18 0332  WBC 8.4 11.4*  RBC 2.88* 2.82*  HCT 28.0* 27.9*  PLT 150 163   Recent Labs    08/30/18 0440 08/31/18 0332  NA 139 141  K 4.2 3.9  CL 108 108  CO2 24 24  BUN 37* 35*  CREATININE 1.42* 1.25*  GLUCOSE 134* 139*  CALCIUM 8.2* 8.1*   No results for input(s): LABPT, INR in the last 72 hours.  EXAM General - Patient is Appropriate and Oriented Extremity - Neurovascular intact Sensation intact distally Intact pulses distally Dorsiflexion/Plantar flexion intact No cellulitis present Compartment soft Dressing - dressing C/D/I and no drainage, wound vac intact with minimal bloody output Honeycomb dressings intact wit serosanguinous drainage. Motor Function - intact, moving foot and toes well on exam.   Past Medical History:  Diagnosis Date  . CAD (coronary artery disease)   . Cervicalgia   . CHF (congestive heart failure) (HCC)   . COPD (chronic obstructive pulmonary disease) (HCC)   . Diastolic  heart failure (HCC)   . Foot drop, right   . History of kidney stones   . Hyperlipidemia    unspecified  . Hypertension   . Myocardial infarction (HCC)   . Osteoarthritis   . Shoulder pain, left   . Sleep apnea   . 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   Weight as of this encounter: 176.2 kg. Advance diet Up with therapy, 20% weightbearing left leg, must wear knee immobilizer locked in extension.  No knee range of motion at this time.  Hg 8.7 this AM, stable Up with therapy today. Continue with abx per IM for UTI/PNA Discharge to SNF pending medicine clearance  DVT Prophylaxis - TED hose and SCDs, plavix, Aspirin  T. Cranston Neighborhris Tangala Wiegert, PA-C [facility] 08/31/2018, 7:57 AM

## 2018-08-31 NOTE — Progress Notes (Signed)
[facility] - [place] [facility]   PATIENT NAME: Dylan [name]    MR#:  [id number]  DATE OF BIRTH:  03/22/1936  SUBJECTIVE:  Patient seen and evaluated today morning Patient appears less lethargic and more responsive Has cough Shortness of breath better On oxygen via nasal cannula Urine culture growing Enterococcus faecalis and Klebsiella  REVIEW OF SYSTEMS:    Review of Systems  Constitutional: Negative for fever, chills weight loss HENT: Negative for ear pain, nosebleeds, congestion, facial swelling, rhinorrhea, neck pain, neck stiffness and ear discharge.   Respiratory: Has cough, shortness of breath, wheezing  Cardiovascular: Negative for chest pain, palpitations and leg swelling.  Gastrointestinal: Negative for heartburn, abdominal pain, vomiting, diarrhea or consitpation Genitourinary: Negative for dysuria, urgency, frequency, hematuria Musculoskeletal: Negative for back pain Neurological: Negative for dizziness, seizures, syncope, focal weakness,  numbness and headaches.  Hematological: Does not bruise/bleed easily.  Psychiatric/Behavioral: Negative for hallucinations, confusion, dysphoric mood  Tolerating Diet: yes  DRUG ALLERGIES:  No Known Allergies  VITALS:  Blood pressure 139/83, pulse 66, temperature (!) 97.4 F (36.3 C), temperature source Oral, resp. rate 20, height 5\' 10"  (1.778 m), weight (!) 176.2 kg, SpO2 99 %.  PHYSICAL EXAMINATION:  Constitutional: Appears well-developed and well-nourished. No distress. HENT: Normocephalic. Marland Kitchen. Oropharynx is clear and moist.  Eyes: Conjunctivae and EOM are normal. PERRLA, no scleral icterus.  Neck: Normal ROM. Neck supple. No JVD. No tracheal deviation. CVS: RRR, S1/S2 +, no murmurs, no gallops, no carotid bruit.  Pulmonary: Decreased breath sounds in both lungs, has Rales in the left lung Abdominal: Soft. BS +,  no distension, tenderness, rebound or guarding.  Musculoskeletal: Extremities with  decreased swelling to left distal thigh, skin intact, healied anterior knee incision.  Small bruised areas to great toe on left with dressing on right foot. Wound vac noted. Neuro: Alert. CN 2-12 grossly intact. No focal deficits. Skin: Skin is warm and dry. No rash noted. Psychiatric: Normal mood and affect.   LABORATORY PANEL:   CBC Recent Labs  Lab 08/31/18 0332  WBC 11.4*  HGB 8.7*  HCT 27.9*  PLT 163   ------------------------------------------------------------------------------------------------------------------  Chemistries  Recent Labs  Lab 08/26/18 0821  08/31/18 0332  NA 139   < > 141  K 3.9   < > 3.9  CL 103   < > 108  CO2 28   < > 24  GLUCOSE 136*   < > 139*  BUN 38*   < > 35*  CREATININE 1.95*   < > 1.25*  CALCIUM 8.2*   < > 8.1*  AST 38  --   --   ALT 37  --   --   ALKPHOS 73  --   --   BILITOT 0.9  --   --    < > = values in this interval not displayed.   ------------------------------------------------------------------------------------------------------------------  Cardiac Enzymes Recent Labs  Lab 08/26/18 0821  TROPONINI 0.07*   ------------------------------------------------------------------------------------------------------------------  RADIOLOGY:  Dg Chest Port 1 View  Result Date: 08/30/2018 CLINICAL DATA:  82 year old male with history of shortness of breath and weakness today. Three days postop following left supracondylar femoral fracture repair. EXAM: PORTABLE CHEST 1 VIEW COMPARISON:  Chest x-ray 08/26/2018. FINDINGS: Lung volumes are slightly low. Patchy ill-defined opacities throughout the mid to lower lungs bilaterally, concerning for areas of infectious consolidation or sequela of recent aspiration. Small bilateral pleural effusions (left greater than right). No evidence of pulmonary edema. Heart size is borderline enlarged. The  patient is rotated to the left on today's exam, resulting in distortion of the mediastinal contours  and reduced diagnostic sensitivity and specificity for mediastinal pathology. Aortic atherosclerosis. IMPRESSION: 1. Bibasilar opacities concerning for sequela of aspiration or developing infection. 2. Small bilateral pleural effusions (left greater than right). 3. Aortic atherosclerosis. Electronically Signed   By: Trudie Reedaniel  Entrikin M.D.   On: 08/30/2018 15:03     ASSESSMENT AND PLAN:   82 year old male with history of CAD status post PCI of RCA x2 in October 2019 who presented to the emergency room after a fall with a fracture of left femur currently under orthopedic service.  1.  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 Health care associated pneumonia with effusion Continue IV Vancomycin and IV zosyn abx Pharmacy to manage vancomycin dosing IV lasix given for diuresis yesterday  3.  Enterococcus faecalis and Klebsiella oxytoca urinary tract infection  patient on IV Zosyn antibiotic based on culture and sensitivity Infectious disease consultation follow-up  4.  CAD: Patient evaluated by cardiology and cleared for surgery Continue dual antiplatelet therapy given history of recent STEMI Continue aspirin, statin, Coreg, isosorbide Continue Plavix (watch hgb tomorrow)  5.  Chronic systolic heart failure ejection fraction 30 to 35% without signs of exacerbation Continue Coreg  6.  Essential hypertension: Continue isosorbide and Coreg  7. CKD stage 3: Creatinine is at baseline  8. Post operative anemia S/p prbc transfusion Hemoglobin stable  8.  Tobacco abuse Tobacco cessation counseled for 6 minutes Nicotine patch offered but patient does not want it  9.Hypoxia secondary to pneumonia Continue oxygen via nasal canula Wean oxygen as tolerated  Management plans discussed with the patient and he is in agreement.  CODE STATUS: Full  TOTAL TIME TAKING CARE OF THIS  PATIENT: 36 minutes.     POSSIBLE D/C  to SNF in 1 to 2 days, DEPENDING ON CLINICAL CONDITION.   Ihor AustinPavan Pyreddy M.D on 08/31/2018 at 11:39 AM  Between 7am to 6pm - Pager - [phone] After 6pm go to [email or web address] - password [employer] [email or web address]  [facility]  Office  [phone]  CC: Primary care physician; Jaclyn Shaggyate, [name], MD  Note: This dictation was prepared with Dragon dictation along with smaller phrase technology. Any transcriptional errors that result from this process are unintentional.

## 2018-08-31 NOTE — Progress Notes (Addendum)
[facility] - [place] at [facility]   PATIENT NAME: Dylan [name]    MR#:  [id number]  DATE OF BIRTH:  [birth year]  SUBJECTIVE:  Patient seen and evaluated today morning Patient appears less lethargic and more responsive Has cough Shortness of breath better On oxygen via nasal cannula Urine culture growing Enterococcus faecalis and Klebsiella  REVIEW OF SYSTEMS:    Review of Systems  Constitutional: Negative for fever, chills weight loss HENT: Negative for ear pain, nosebleeds, congestion, facial swelling, rhinorrhea, neck pain, neck stiffness and ear discharge.   Respiratory: Has cough, shortness of breath, wheezing  Cardiovascular: Negative for chest pain, palpitations and leg swelling.  Gastrointestinal: Negative for heartburn, abdominal pain, vomiting, diarrhea or consitpation Genitourinary: Negative for dysuria, urgency, frequency, hematuria Musculoskeletal: Negative for back pain Neurological: Negative for dizziness, seizures, syncope, focal weakness,  numbness and headaches.  Hematological: Does not bruise/bleed easily.  Psychiatric/Behavioral: Negative for hallucinations, confusion, dysphoric mood  Tolerating Diet: yes  DRUG ALLERGIES:  No Known Allergies  VITALS:  Blood pressure 139/83, pulse 66, temperature (!) 97.4 F (36.3 C), temperature source Oral, resp. rate 20, height 5\' 10"  (1.778 m), weight (!) 176.2 kg, SpO2 99 %.  PHYSICAL EXAMINATION:  Constitutional: Appears well-developed and well-nourished. No distress. HENT: Normocephalic. Marland Kitchen. Oropharynx is clear and moist.  Eyes: Conjunctivae and EOM are normal. PERRLA, no scleral icterus.  Neck: Normal ROM. Neck supple. No JVD. No tracheal deviation. CVS: RRR, S1/S2 +, no murmurs, no gallops, no carotid bruit.  Pulmonary: Decreased breath sounds in both lungs, has Rales in the left lung Abdominal: Soft. BS +,  no distension, tenderness, rebound or guarding.  Musculoskeletal: Extremities with  decreased swelling to left distal thigh, skin intact, healied anterior knee incision.  Small bruised areas to great toe on left with dressing on right foot. Wound vac noted. Neuro: Alert. CN 2-12 grossly intact. No focal deficits. Skin: Skin is warm and dry. No rash noted. Psychiatric: Normal mood and affect.   LABORATORY PANEL:   CBC Recent Labs  Lab 08/31/18 0332  WBC 11.4*  HGB 8.7*  HCT 27.9*  PLT 163   ------------------------------------------------------------------------------------------------------------------  Chemistries  Recent Labs  Lab 08/26/18 0821  08/31/18 0332  NA 139   < > 141  K 3.9   < > 3.9  CL 103   < > 108  CO2 28   < > 24  GLUCOSE 136*   < > 139*  BUN 38*   < > 35*  CREATININE 1.95*   < > 1.25*  CALCIUM 8.2*   < > 8.1*  AST 38  --   --   ALT 37  --   --   ALKPHOS 73  --   --   BILITOT 0.9  --   --    < > = values in this interval not displayed.   ------------------------------------------------------------------------------------------------------------------  Cardiac Enzymes Recent Labs  Lab 08/26/18 0821  TROPONINI 0.07*   ------------------------------------------------------------------------------------------------------------------  RADIOLOGY:  Dg Chest Port 1 View  Result Date: 08/30/2018 CLINICAL DATA:  82 year old male with history of shortness of breath and weakness today. Three days postop following left supracondylar femoral fracture repair. EXAM: PORTABLE CHEST 1 VIEW COMPARISON:  Chest x-ray 08/26/2018. FINDINGS: Lung volumes are slightly low. Patchy ill-defined opacities throughout the mid to lower lungs bilaterally, concerning for areas of infectious consolidation or sequela of recent aspiration. Small bilateral pleural effusions (left greater than right). No evidence of pulmonary edema. Heart size is borderline enlarged. The  patient is rotated to the left on today's exam, resulting in distortion of the mediastinal contours  and reduced diagnostic sensitivity and specificity for mediastinal pathology. Aortic atherosclerosis. IMPRESSION: 1. Bibasilar opacities concerning for sequela of aspiration or developing infection. 2. Small bilateral pleural effusions (left greater than right). 3. Aortic atherosclerosis. Electronically Signed   By: Trudie Reedaniel  Entrikin M.D.   On: 08/30/2018 15:03     ASSESSMENT AND PLAN:   82 year old male with history of CAD status post PCI of RCA x2 in October 2019 who presented to the emergency room after a fall with a fracture of left femur currently under orthopedic service.  1.  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 Health care associated pneumonia with effusion Continue IV Vancomycin and IV zosyn abx Pharmacy to manage vancomycin dosing IV lasix given for diuresis yesterday  3.  Enterococcus faecalis and Klebsiella oxytoca urinary tract infection multidrug-resistant  patient on IV Zosyn antibiotic based on culture and sensitivity Infectious disease consultation follow-up for expert opinion  4.  CAD: Patient evaluated by cardiology and cleared for surgery Continue dual antiplatelet therapy given history of recent STEMI Continue aspirin, statin, Coreg, isosorbide Continue Plavix (watch hgb tomorrow)  5.  Chronic systolic heart failure ejection fraction 30 to 35% without signs of exacerbation Continue Coreg  6.  Essential hypertension: Continue isosorbide and Coreg  7. CKD stage 3: Creatinine is at baseline  8. Post operative anemia S/p prbc transfusion Hemoglobin stable  8.  Tobacco abuse Tobacco cessation counseled for 6 minutes Nicotine patch offered but patient does not want it  9.Hypoxia secondary to pneumonia Continue oxygen via nasal canula Wean oxygen as tolerated  Management plans discussed with the patient and he is in agreement.  CODE STATUS:  Full  TOTAL TIME TAKING CARE OF THIS PATIENT: 36 minutes.     POSSIBLE D/C  to SNF in 1 to 2 days, DEPENDING ON CLINICAL CONDITION.   Ihor AustinPavan Pyreddy M.D on 08/31/2018 at 11:43 AM  Between 7am to 6pm - Pager - [phone] After 6pm go to [email or web address] - password [email or web address]  [facility] [place] [facility]  Office  [phone]  CC: Primary care physician; Jaclyn Shaggyate, [name], MD  Note: This dictation was prepared with Dragon dictation along with smaller phrase technology. Any transcriptional errors that result from this process are unintentional.

## 2018-08-31 NOTE — Progress Notes (Signed)
ID Full consult note to follow Presented with not able to ass urine 8 hours and was catheterized which has been removed Check bladder scan for post void residue If no significant residue will only need 3-5 days of Iv zosyn. If significant residue then urology consult, renal ultrasound to r/o hydro

## 2018-08-31 NOTE — Progress Notes (Signed)
Physical Therapy Treatment Patient Details Name: Dylan [name] MRN: [id number] DOB: 30-Aug-1936 Today's Date: 08/31/2018    History of Present Illness Pt is 82 yo male s/p ORIF supracondylar femur fx above prosthesis, quadriceps repair. post PCI of RCA x2 in October 2019 who presented to the emergency room after a fall, fracture of left femur. PMH of HTN, beign essential tremor, lymphedema, COPD, CHF, R foot drop    PT Comments    Patient agreeable to participation with session, but limited by pain to L knee and generalized fatigue with exertional activity.  Continues to require extensive assist (+3) for all mobility tasks; very limited ability to actively manage L LE.  Sitting balance improves with accommodation to upright position (though still requires min assist); fatigues quickly and anterior weight translation, core stability limited by body habitus. Unable to tolerate R LE in closed-chain WBing position adequate for weight acceptance or transfer attempts this time; anticipate significant difficulty with adherence to L LE WBing restrictions when transfers are appropriate. May consider transition to QD if activity tolerance, functional progression remains modest.   Follow Up Recommendations  SNF     Equipment Recommendations       Recommendations for Other Services       Precautions / Restrictions Precautions Precautions: Fall;Knee Precaution Comments: monitor O2 Required Braces or Orthoses: Knee Immobilizer - Left Knee Immobilizer - Left: On at all times Restrictions Weight Bearing Restrictions: Yes LLE Weight Bearing: Partial weight bearing LLE Partial Weight Bearing Percentage or Pounds: 20 Other Position/Activity Restrictions: knee brace locked in extension at all times, no ROM exercises on L knee    Mobility  Bed Mobility Overal bed mobility: Needs Assistance Bed Mobility: Rolling;Supine to Sit;Sit to Supine Rolling: Max assist;+2 for physical assistance   Supine  to sit: Max assist;+2 for physical assistance;HOB elevated Sit to supine: Max assist;+2 for physical assistance;HOB elevated   General bed mobility comments: +3 physical assist, verbal cues for technique and complete sup>sit EOB; total assist for positioning and protection of L LE with all mobility  Transfers                 General transfer comment: unsafe/unable  Ambulation/Gait             General Gait Details: unsafe/unable   Stairs             Wheelchair Mobility    Modified Rankin (Stroke Patients Only)       Balance Overall balance assessment: Needs assistance Sitting-balance support: No upper extremity supported;Feet supported Sitting balance-Leahy Scale: Poor Sitting balance - Comments: posterior trunk lean, significant difficulty with anterior trunk lean/anterior weight translation (limited by weakness, abdominal adiposity); max progressing to min assist to maintain                                    Cognition Arousal/Alertness: Awake/alert Behavior During Therapy: WFL for tasks assessed/performed Overall Cognitive Status: Within Functional Limits for tasks assessed                                        Exercises Other Exercises Other Exercises: Rolling bilat, max assist +2-3, for rotation and position/protection of L LE; heavy use of bedrails to assist with UB.  Dep assist +2 for hygiene, clothing management and linen change after incontinent bowel/bladder Other  Exercises: Unsupported sitting edge of bed x10-12 minutes, max progressing to min assist for correction of R post/lateral trunk lean.  L LE propped on firm surface for support and positioning throughout.  Difficulty with R LE foot placement on floor, unable to tolerate position for appropriate closed-chain WBing with sitting activities.  Did incorporate R UE reaching tasks as able (limited by chronic shoulder weakness/arthritic changes) to promote anterior  weight translation and progression towards functional transfers.  Sitting functional reach only 1-2" from immediate BOS (requiring assist to attain/maintain neutral at all times). Other Exercises: pt instructed to use pursed lip breathing with rest breaks during activity to minimize SOB    General Comments General comments (skin integrity, edema, etc.): replaced pink sacral foam pad following hygiene during bed level toileting task, no open skin breaks noted, RN notified      Pertinent Vitals/Pain Pain Assessment: 0-10 Pain Score: 9  Pain Location: L knee, back after bed mobility Pain Descriptors / Indicators: Aching;Grimacing;Guarding Pain Intervention(s): Limited activity within patient's tolerance;Monitored during session;Repositioned;Patient requesting pain meds-RN notified;RN gave pain meds during session    Home Living Family/patient expects to be discharged to:: Private residence                    Prior Function            PT Goals (current goals can now be found in the care plan section) Acute Rehab PT Goals Patient Stated Goal: To get better again  PT Goal Formulation: With patient Time For Goal Achievement: 09/11/18 Potential to Achieve Goals: Good Progress towards PT goals: Progressing toward goals    Frequency    BID      PT Plan Current plan remains appropriate    Co-evaluation   Reason for Co-Treatment: Complexity of the patient's impairments (multi-system involvement);For patient/therapist safety;To address functional/ADL transfers   OT goals addressed during session: ADL's and self-care      AM-PAC PT "6 Clicks" Mobility   Outcome Measure  Help needed turning from your back to your side while in a flat bed without using bedrails?: A Lot Help needed moving from lying on your back to sitting on the side of a flat bed without using bedrails?: A Lot Help needed moving to and from a bed to a chair (including a wheelchair)?: Total Help needed  standing up from a chair using your arms (e.g., wheelchair or bedside chair)?: Total Help needed to walk in hospital room?: Total Help needed climbing 3-5 steps with a railing? : Total 6 Click Score: 8    End of Session Equipment Utilized During Treatment: Left knee immobilizer Activity Tolerance: Patient limited by fatigue;Patient limited by pain;Patient limited by lethargy Patient left: in bed;with call bell/phone within reach;with bed alarm set Nurse Communication: Patient requests pain meds PT Visit Diagnosis: Unsteadiness on feet (R26.81);Muscle weakness (generalized) (M62.81);History of falling (Z91.81);Difficulty in walking, not elsewhere classified (R26.2);Pain Pain - Right/Left: Left Pain - part of body: Knee     Time: 1202-1246 PT Time Calculation (min) (ACUTE ONLY): 44 min  Charges:  $Therapeutic Activity: 23-37 mins                     Ashtin Rosner H. Manson PasseyBrown, PT, DPT, NCS 08/31/18, 2:17 PM [phone]

## 2018-08-31 NOTE — Progress Notes (Signed)
Pt has been voiding small amounts frequently today and yesterday per nursing report. Nursing staff have been unable to accurately obtain bladder scan. Scanner keeps showing 0 cc, pt denies any abdominal pain and feels that he is emptying his bladder. Also, pt has scrotal edema. Dr. Tobi BastosPyreddy notified of above, no orders received. Pt and his wife updated. Pt encouraged to drink fluids.

## 2018-08-31 NOTE — Care Management Important Message (Signed)
Important Message  Patient Details  Name: Orpah ClintonJerry B Mosley MRN: [id number] Date of Birth: 08/09/1936   Medicare Important Message Given:  Yes    Collie SiadAngela Kassady Laboy, RN 08/31/2018, 12:22 PM

## 2018-08-31 NOTE — Progress Notes (Signed)
Occupational Therapy Treatment Patient Details Name: Dylan [name] MRN: [id number] DOB: 29-Dec-1935 Today's Date: 08/31/2018    History of present illness Pt is 82 yo male s/p ORIF supracondylar femur fx above prosthesis, quadriceps repair. post PCI of RCA x2 in October 2019 who presented to the emergency room after a fall, fracture of left femur. PMH of HTN, beign essential tremor, lymphedema, COPD, CHF, R foot drop   OT comments  Pt seen for co-tx this date with PT. Pt agreeable, reporting minimal pain at rest in LLE. Pt participated in rolling side to side for dep hygiene following BM prior to further bed mobility requiring +3 physical assist and cues to use deep breathing following efforts to help recover. Pt required 3+ assist for sup<>sit EOB in preparation for seated ADL tasks requiring assist initially for sitting balance, improving with verbal cues during lateral and forward leans.  Pt reporting 9/10 LLE pain after mobility. RN in room near end of session to provide pain meds. Will continue to progress towards goals.    Follow Up Recommendations  SNF    Equipment Recommendations  None recommended by OT    Recommendations for Other Services      Precautions / Restrictions Precautions Precautions: Fall;Knee Precaution Comments: monitor O2 Required Braces or Orthoses: Knee Immobilizer - Left Knee Immobilizer - Left: On at all times Restrictions Weight Bearing Restrictions: Yes LLE Weight Bearing: Partial weight bearing LLE Partial Weight Bearing Percentage or Pounds: 20 Other Position/Activity Restrictions: knee brace locked in extension at all times, no ROM exercises on LLE        Mobility Bed Mobility Overal bed mobility: Needs Assistance Bed Mobility: Rolling;Supine to Sit;Sit to Supine Rolling: Max assist;+2 for physical assistance   Supine to sit: Max assist;+2 for physical assistance;HOB elevated Sit to supine: Max assist;+2 for physical assistance;HOB elevated    General bed mobility comments: +3 physical assist, verbal cues for technique and complete sup>sit EOB  Transfers                      Balance Overall balance assessment: Needs assistance Sitting-balance support: Single extremity supported;Feet supported Sitting balance-Leahy Scale: Poor Sitting balance - Comments: poor+ to fair-, improved with weight shifting and forward leans                                   ADL either performed or assessed with clinical judgement   ADL Overall ADL's : Needs assistance/impaired                                       General ADL Comments: Continues to be Max A to Dependent for bathing, dressing, and toileting from bed level     Vision Baseline Vision/History: Wears glasses Patient Visual Report: No change from baseline     Perception     Praxis      Cognition Arousal/Alertness: Awake/alert Behavior During Therapy: WFL for tasks assessed/performed Overall Cognitive Status: Within Functional Limits for tasks assessed                                          Exercises Other Exercises Other Exercises: once EOB pt instructed in lateral and forward leans reaching with  RUE to improve sitting balance, with pt requiring assist Other Exercises: pt instructed to use pursed lip breathing with rest breaks during activity to minimize SOB   Shoulder Instructions       General Comments replaced pink sacral foam pad following hygiene during bed level toileting task, no open skin breaks noted, RN notified    Pertinent Vitals/ Pain       Pain Assessment: 0-10 Pain Score: 9  Pain Location: L knee, back after bed mobility Pain Descriptors / Indicators: Grimacing;Guarding;Moaning;Aching Pain Intervention(s): Limited activity within patient's tolerance;Monitored during session;Repositioned;RN gave pain meds during session  Home Living Family/patient expects to be discharged to:: Private  residence                                        Prior Functioning/Environment              Frequency  Min 2X/week        Progress Toward Goals  OT Goals(current goals can now be found in the care plan section)  Progress towards OT goals: OT to reassess next treatment  Acute Rehab OT Goals Patient Stated Goal: To get better again  OT Goal Formulation: With patient Time For Goal Achievement: 09/12/18 Potential to Achieve Goals: Fair  Plan Discharge plan remains appropriate;Frequency needs to be updated    Co-evaluation    PT/OT/SLP Co-Evaluation/Treatment: Yes Reason for Co-Treatment: Complexity of the patient's impairments (multi-system involvement);For patient/therapist safety;To address functional/ADL transfers   OT goals addressed during session: ADL's and self-care      AM-PAC OT "6 Clicks" Daily Activity     Outcome Measure   Help from another person eating meals?: None Help from another person taking care of personal grooming?: A Little Help from another person toileting, which includes using toliet, bedpan, or urinal?: Total Help from another person bathing (including washing, rinsing, drying)?: Total Help from another person to put on and taking off regular upper body clothing?: Total Help from another person to put on and taking off regular lower body clothing?: Total 6 Click Score: 11    End of Session Equipment Utilized During Treatment: Oxygen  OT Visit Diagnosis: Repeated falls (R29.6);Muscle weakness (generalized) (M62.81);History of falling (Z91.81);Pain Pain - Right/Left: Left Pain - part of body: Knee   Activity Tolerance Patient tolerated treatment well   Patient Left in bed;with call bell/phone within reach;with family/visitor present;with bed alarm set   Nurse Communication          Time: [id number] OT Time Calculation (min): 44 min  Charges: OT General Charges $OT Visit: 1 Visit OT Treatments $Self Care/Home  Management : 8-22 mins  Richrd PrimeJamie Stiller, MPH, MS, OTR/L ascom [phone] 08/31/18, 1:29 PM

## 2018-08-31 NOTE — Progress Notes (Signed)
Patient ID: Dylan [name], male   DOB: Dec 15, 1935, 82 y.o.   MRN: [id number]  Asked by family and by nurse to see the patient.  Patient had scrotal swelling and penile swelling 3 times the size of normal.  Getting back on the Entresto and torsemide should help.  Elevation also helps.  It is painful for him if anybody is touching it or if he is moving around.  When he is just lying there it is not painful for him.  Continue to monitor.  Dr. Alford Highlandichard Jaque Dacy

## 2018-08-31 NOTE — Progress Notes (Signed)
Physical Therapy Treatment Patient Details Name: Dylan ClintonJerry B Plaisted MRN: [id number] DOB: [birth year] Today's Date: 08/31/2018    History of Present Illness Pt is 82 yo male s/p ORIF supracondylar femur fx above prosthesis, quadriceps repair. post PCI of RCA x2 in October 2019 who presented to the emergency room after a fall, fracture of left femur. PMH of HTN, beign essential tremor, lymphedema, COPD, CHF, R foot drop    PT Comments    Pt reluctant to participate with PT for supine exercises, but agreeable with encouragement. Pt reports pain in LLE (8/10 via faces scale) that worsens with QS exercise; exercise ceased at that time. Pt participates in limited other exercises with assist needed throughout. Decrease frequency to QD due to decreased participation/desire for PT. Continue PT to progress strength, endurance to progress functional mobility within pt weight bearing limitations on L.   Follow Up Recommendations  SNF     Equipment Recommendations       Recommendations for Other Services       Precautions / Restrictions Precautions Precautions: Fall;Knee Precaution Comments: monitor O2 Required Braces or Orthoses: Knee Immobilizer - Left Knee Immobilizer - Left: On at all times;Other (comment)(No range to L knee) Restrictions Weight Bearing Restrictions: Yes LLE Weight Bearing: Partial weight bearing LLE Partial Weight Bearing Percentage or Pounds: 20 Other Position/Activity Restrictions: knee brace locked in extension at all times, no ROM exercises on L knee    Mobility  Bed Mobility Overal bed mobility: Needs Assistance Bed Mobility: Rolling;Supine to Sit;Sit to Supine Rolling: Max assist;+2 for physical assistance   Supine to sit: Max assist;+2 for physical assistance;HOB elevated Sit to supine: Max assist;+2 for physical assistance;HOB elevated   General bed mobility comments: Not tested  Transfers                 General transfer comment:  unsafe/unable  Ambulation/Gait             General Gait Details: unsafe/unable   Stairs             Wheelchair Mobility    Modified Rankin (Stroke Patients Only)       Balance Overall balance assessment: Needs assistance Sitting-balance support: No upper extremity supported;Feet supported Sitting balance-Leahy Scale: Poor Sitting balance - Comments: posterior trunk lean, significant difficulty with anterior trunk lean/anterior weight translation (limited by weakness, abdominal adiposity); max progressing to min assist to maintain                                    Cognition Arousal/Alertness: Awake/alert Behavior During Therapy: WFL for tasks assessed/performed Overall Cognitive Status: Within Functional Limits for tasks assessed                                        Exercises General Exercises - Lower Extremity Ankle Circles/Pumps: AAROM;Both;10 reps;Supine Quad Sets: Strengthening;Both;5 reps;Supine(notes increased pain L; ceased exercise) Gluteal Sets: Strengthening;Both;10 reps;Supine Heel Slides: AAROM;Right;10 reps;Supine Hip ABduction/ADduction: AAROM;Both;10 reps;Supine Other Exercises Other Exercises: Rolling bilat, max assist +2-3, for rotation and position/protection of L LE; heavy use of bedrails to assist with UB.  Dep assist +2 for hygiene, clothing management and linen change after incontinent bowel/bladder Other Exercises: Unsupported sitting edge of bed x10-12 minutes, max progressing to min assist for correction of R post/lateral trunk lean.  L LE propped on  firm surface for support and positioning throughout.  Difficulty with R LE foot placement on floor, unable to tolerate position for appropriate closed-chain WBing with sitting activities.  Did incorporate R UE reaching tasks as able (limited by chronic shoulder weakness/arthritic changes) to promote anterior weight translation and progression towards functional  transfers.  Sitting functional reach only 1-2" from immediate BOS (requiring assist to attain/maintain neutral at all times). Other Exercises: pt instructed to use pursed lip breathing with rest breaks during activity to minimize SOB    General Comments General comments (skin integrity, edema, etc.): replaced pink sacral foam pad following hygiene during bed level toileting task, no open skin breaks noted, RN notified      Pertinent Vitals/Pain Pain Assessment: Faces Pain Score: 9  Faces Pain Scale: Hurts whole lot Pain Location: LLE Pain Descriptors / Indicators: Aching;Grimacing;Guarding Pain Intervention(s): Limited activity within patient's tolerance    Home Living Family/patient expects to be discharged to:: Private residence                    Prior Function            PT Goals (current goals can now be found in the care plan section) Acute Rehab PT Goals Patient Stated Goal: To get better again  PT Goal Formulation: With patient Time For Goal Achievement: 09/11/18 Potential to Achieve Goals: Good Progress towards PT goals: Progressing toward goals(slowly)    Frequency    QD      PT Plan Current plan remains appropriate    Co-evaluation   Reason for Co-Treatment: Complexity of the patient's impairments (multi-system involvement);For patient/therapist safety;To address functional/ADL transfers   OT goals addressed during session: ADL's and self-care      AM-PAC PT "6 Clicks" Mobility   Outcome Measure  Help needed turning from your back to your side while in a flat bed without using bedrails?: A Lot Help needed moving from lying on your back to sitting on the side of a flat bed without using bedrails?: A Lot Help needed moving to and from a bed to a chair (including a wheelchair)?: Total Help needed standing up from a chair using your arms (e.g., wheelchair or bedside chair)?: Total Help needed to walk in hospital room?: Total Help needed climbing  3-5 steps with a railing? : Total 6 Click Score: 8    End of Session Equipment Utilized During Treatment: Left knee immobilizer Activity Tolerance: Patient limited by pain;Patient limited by fatigue;Other (comment)(reluctant to all exercises) Patient left: in bed;with call bell/phone within reach;with bed alarm set Nurse Communication: Patient requests pain meds PT Visit Diagnosis: Unsteadiness on feet (R26.81);Muscle weakness (generalized) (M62.81);History of falling (Z91.81);Difficulty in walking, not elsewhere classified (R26.2);Pain Pain - Right/Left: Left Pain - part of body: Knee     Time: [id number] PT Time Calculation (min) (ACUTE ONLY): 17 min  Charges:  $Therapeutic Exercise: 8-22 mins $Therapeutic Activity: 23-37 mins                      Scot DockHeidi E Barnes, PTA 08/31/2018, 3:22 PM

## 2018-08-31 NOTE — Consult Note (Addendum)
NAME: Dylan [name]  DOB: 10/26/1935  MRN: [id number]  Date/Time: 08/31/2018 5:07 PM Dr. Tobi Bastos Subjective:  REASON FOR CONSULT: UTI ? Dylan [name] is a 82 y.o. male with a history of CAD recent stents, recent left TKA, CHF, presents to the hospital after a fall. Patient has had a complicated medical course in the last few months.  On June 01, 2018 he underwent left total knee arthroplasty For severe degenerative arthrosis of the left knee.  He was sent to rehab.  He presented to the hospital on 06/21/2018 with epigastric pain nausea and bloating.  EKG showed ST elevation MI and he was taken to Cath Lab and had 2 stents placed in RCA.  He was sent home on aspirin and Plavix along with other medications. He presented to the ED on 08/26/2018 with increasing weakness from home and a fall.  Was complaining of pain in the left leg.  He also had urinary retention and had not passed urine for full 8 hours prior to admission and the catheter was inserted.  X-ray of the left leg showed comminuted fracture of the distal femoral metaphysis about the femoral arthroplasty component on the left side.  He underwent open reduction internal fixation of the supracondylar femur fracture and also had quadriceps repair as it was ruptured. The urine culture shows Klebsiella and Enterococcus faecalis. He is on Zosyn and I been asked to see him for that. Patient states he has never had urinary issues before and was not sure why that particular day he could not pass any urine. He denies any abdominal pain The catheter has been removed and he is able to pass urine very well now Past Medical History:  Diagnosis Date  . CAD (coronary artery disease)   . Cervicalgia   . CHF (congestive heart failure) (HCC)   . COPD (chronic obstructive pulmonary disease) (HCC)   . Diastolic heart failure (HCC)   . Foot drop, right   . History of kidney stones   . Hyperlipidemia    unspecified  . Hypertension   . Myocardial  infarction (HCC)   . Osteoarthritis   . Shoulder pain, left   . Sleep apnea   . Tremor, essential     Past Surgical History:  Procedure Laterality Date  . APPLICATION OF WOUND VAC Right 11/13/2017   Procedure: APPLICATION OF WOUND VAC;  Surgeon: Annice Needy, MD;  Location: [facility];  Service: Vascular;  Laterality: Right;  . BACK SURGERY  06/2010  . CARDIAC CATHETERIZATION Left 04/30/2016   Procedure: Left Heart Cath and Coronary Angiography;  Surgeon: Laurier Nancy, MD;  Location: [facility];  Service: Cardiovascular;  Laterality: Left;  . COLONOSCOPY    . CORONARY ANGIOPLASTY    . CORONARY/GRAFT ACUTE MI REVASCULARIZATION N/A 06/21/2018   Procedure: Coronary/Graft Acute MI Revascularization;  Surgeon: Iran Ouch, MD;  Location: [facility];  Service: Cardiovascular;  Laterality: N/A;  . KNEE ARTHROPLASTY Left 06/01/2018   Procedure: COMPUTER ASSISTED TOTAL KNEE ARTHROPLASTY;  Surgeon: Donato Heinz, MD;  Location: [facility];  Service: Orthopedics;  Laterality: Left;  . KNEE ARTHROSCOPY Left   . KNEE SURGERY Left 1998  . LEFT HEART CATH AND CORONARY ANGIOGRAPHY N/A 06/21/2018   Procedure: LEFT HEART CATH AND CORONARY ANGIOGRAPHY;  Surgeon: Iran Ouch, MD;  Location: [facility];  Service: Cardiovascular;  Laterality: N/A;  . ORIF FEMUR FRACTURE Left 08/27/2018   Procedure: OPEN REDUCTION INTERNAL FIXATION (ORIF) SUPRACONDYLAR  FEMUR FRACTURE ABOVE PROSTHESIS, QUADRICEPS REPAIR;  Surgeon: Kennedy BuckerMenz, Michael, MD;  Location: [facility];  Service: Orthopedics;  Laterality: Left;  . TONSILLECTOMY    . WOUND DEBRIDEMENT Right 11/13/2017   Procedure: DEBRIDEMENT WOUND;  Surgeon: Annice Needyew, Jason S, MD;  Location: [facility];  Service: Vascular;  Laterality: Right;    Social History   Socioeconomic History  . Marital status: Married    Spouse name: Eber JonesCarolyn  . Number of children: 2  . Years of education: 7111  . Highest education level: 11th grade  Occupational  History  . Occupation: retired  Engineer, productionocial Needs  . Financial resource strain: Not on file  . Food insecurity:    Worry: Not on file    Inability: Not on file  . Transportation needs:    Medical: Not on file    Non-medical: Not on file  Tobacco Use  . Smoking status: Former Smoker    Packs/day: 0.25    Years: 58.00    Pack years: 14.50    Types: Cigarettes  . Smokeless tobacco: Former NeurosurgeonUser  . Tobacco comment: quit the begginning of septembet 2019  Substance and Sexual Activity  . Alcohol use: No    Alcohol/week: 0.0 standard drinks  . Drug use: No  . Sexual activity: Not on file  Lifestyle  . Physical activity:    Days per week: Not on file    Minutes per session: Not on file  . Stress: Not on file  Relationships  . Social connections:    Talks on phone: Not on file    Gets together: Not on file    Attends religious service: Not on file    Active member of club or organization: Not on file    Attends meetings of clubs or organizations: Not on file    Relationship status: Not on file  . Intimate partner violence:    Fear of current or ex partner: Not on file    Emotionally abused: Not on file    Physically abused: Not on file    Forced sexual activity: Not on file  Other Topics Concern  . Not on file  Social History Narrative   Full Code with HCPOA and living will   Married with 2 children   Former smoker   Alcohol - none   Smokeless tobacco - none    Family History  Problem Relation Age of Onset  . Diabetes Son   . Cancer Mother   . Colon cancer Mother   . Lung cancer Mother   . Tremor Father   . Heart disease Father   . Tremor Brother   . Bladder Cancer Brother   . Tremor Sister    No Known Allergies ? Current Facility-Administered Medications  Medication Dose Route Frequency Provider Last Rate Last Dose  . 0.9 %  sodium chloride infusion   Intravenous PRN Ihor AustinPyreddy, Pavan, MD   Stopped at 08/31/18 1343  . acetaminophen (TYLENOL) tablet 650 mg  650 mg  Oral Q6H PRN Auburn BilberryPatel, Shreyang, MD   650 mg at 08/31/18 1702  . alum & mag hydroxide-simeth (MAALOX/MYLANTA) 200-200-20 MG/5ML suspension 30 mL  30 mL Oral Q4H PRN Kennedy BuckerMenz, Michael, MD      . aspirin EC tablet 81 mg  81 mg Oral Daily Kennedy BuckerMenz, Michael, MD   81 mg at 08/31/18 0943  . atorvastatin (LIPITOR) tablet 80 mg  80 mg Oral Daily Kennedy BuckerMenz, Michael, MD   80 mg at 08/31/18 0944  . bisacodyl (DULCOLAX) suppository 10  mg  10 mg Rectal Daily PRN Kennedy Bucker, MD   10 mg at 08/30/18 2325  . carvedilol (COREG) tablet 6.25 mg  6.25 mg Oral BID WC Kennedy Bucker, MD   6.25 mg at 08/31/18 1702  . ceFAZolin (ANCEF) IVPB 2g/100 mL premix  2 g Intravenous Once Kennedy Bucker, MD      . clopidogrel (PLAVIX) tablet 75 mg  75 mg Oral Daily Kennedy Bucker, MD   75 mg at 08/31/18 0943  . dextromethorphan (DELSYM) 30 MG/5ML liquid 30 mg  30 mg Oral BID PRN Ihor Austin, MD   30 mg at 08/31/18 1659   And  . guaiFENesin (MUCINEX) 12 hr tablet 600 mg  600 mg Oral BID PRN Ihor Austin, MD      . docusate sodium (COLACE) capsule 100 mg  100 mg Oral BID Kennedy Bucker, MD   100 mg at 08/31/18 0943  . ferrous fumarate-b12-vitamic C-folic acid (TRINSICON / FOLTRIN) capsule 1 capsule  1 capsule Oral BID PC Kennedy Bucker, MD   1 capsule at 08/31/18 1702  . gabapentin (NEURONTIN) capsule 400 mg  400 mg Oral QHS Pyreddy, Pavan, MD      . HYDROcodone-acetaminophen (NORCO/VICODIN) 5-325 MG per tablet 1 tablet  1 tablet Oral Q4H PRN Ihor Austin, MD   1 tablet at 08/31/18 1238  . ipratropium-albuterol (DUONEB) 0.5-2.5 (3) MG/3ML nebulizer solution 3 mL  3 mL Nebulization Q6H PRN Pyreddy, Pavan, MD      . isosorbide mononitrate (IMDUR) 24 hr tablet 30 mg  30 mg Oral Daily Kennedy Bucker, MD   30 mg at 08/31/18 0943  . ketotifen (ZADITOR) 0.025 % ophthalmic solution 1 drop  1 drop Both Eyes BID Pyreddy, Vivien Rota, MD      . lactated ringers infusion   Intravenous Continuous Kennedy Bucker, MD 50 mL/hr at 08/27/18 1241    . loratadine  (CLARITIN) tablet 10 mg  10 mg Oral Daily Pyreddy, Vivien Rota, MD   10 mg at 08/31/18 1702  . magnesium citrate solution 1 Bottle  1 Bottle Oral Once PRN Kennedy Bucker, MD      . magnesium hydroxide (MILK OF MAGNESIA) suspension 30 mL  30 mL Oral Daily PRN Kennedy Bucker, MD   30 mL at 08/30/18 0859  . menthol-cetylpyridinium (CEPACOL) lozenge 3 mg  1 lozenge Oral PRN Kennedy Bucker, MD       Or  . phenol (CHLORASEPTIC) mouth spray 1 spray  1 spray Mouth/Throat PRN Kennedy Bucker, MD      . methocarbamol (ROBAXIN) tablet 500 mg  500 mg Oral Q6H PRN Kennedy Bucker, MD   500 mg at 08/29/18 1932   Or  . methocarbamol (ROBAXIN) 500 mg in dextrose 5 % 50 mL IVPB  500 mg Intravenous Q6H PRN Kennedy Bucker, MD      . metoCLOPramide (REGLAN) tablet 5-10 mg  5-10 mg Oral Q8H PRN Kennedy Bucker, MD       Or  . metoCLOPramide (REGLAN) injection 5-10 mg  5-10 mg Intravenous Q8H PRN Kennedy Bucker, MD      . morphine 2 MG/ML injection 0.5 mg  0.5 mg Intravenous Q2H PRN Kennedy Bucker, MD   0.5 mg at 08/28/18 1608  . multivitamin with minerals tablet 1 tablet  1 tablet Oral Daily Kennedy Bucker, MD   1 tablet at 08/31/18 [phone]  . nitroGLYCERIN (NITROSTAT) SL tablet 0.4 mg  0.4 mg Sublingual Q5 min PRN Kennedy Bucker, MD      . ondansetron [facility]) tablet 4  mg  4 mg Oral Q6H PRN Kennedy Bucker, MD       Or  . ondansetron [facility]) injection 4 mg  4 mg Intravenous Q6H PRN Kennedy Bucker, MD      . piperacillin-tazobactam (ZOSYN) IVPB 3.375 g  3.375 g Intravenous Q8H Pyreddy, Vivien Rota, MD 12.5 mL/hr at 08/31/18 1652    . potassium chloride (K-DUR,KLOR-CON) CR tablet 10 mEq  10 mEq Oral Daily Pyreddy, Vivien Rota, MD   10 mEq at 08/31/18 1702  . primidone (MYSOLINE) tablet 200 mg  200 mg Oral BID Kennedy Bucker, MD   200 mg at 08/31/18 0944  . sacubitril-valsartan (ENTRESTO) 24-26 mg per tablet  1 tablet Oral BID Pyreddy, Pavan, MD      . sodium chloride 0.9 % bolus 250 mL  250 mL Intravenous Once Kennedy Bucker, MD      . spironolactone  (ALDACTONE) tablet 12.5 mg  12.5 mg Oral Daily Pyreddy, Pavan, MD      . torsemide (DEMADEX) tablet 20 mg  20 mg Oral Daily Pyreddy, Pavan, MD   20 mg at 08/31/18 1702  . umeclidinium-vilanterol (ANORO ELLIPTA) 62.5-25 MCG/INH 1 puff  1 puff Inhalation Daily Kennedy Bucker, MD   1 puff at 08/31/18 [phone]  . vitamin C (ASCORBIC ACID) tablet 500 mg  500 mg Oral Daily Kennedy Bucker, MD   500 mg at 08/31/18 9604  . zolpidem (AMBIEN) tablet 5 mg  5 mg Oral QHS PRN Kennedy Bucker, MD   5 mg at 08/28/18 0131     Abtx:  Anti-infectives (From admission, onward)   Start     Dose/Rate Route Frequency Ordered Stop   08/30/18 2200  vancomycin (VANCOCIN) 1,250 mg in sodium chloride 0.9 % 250 mL IVPB  Status:  Discontinued     1,250 mg 166.7 mL/hr over 90 Minutes Intravenous Every 12 hours 08/30/18 1547 08/31/18 1432   08/30/18 1600  vancomycin (VANCOCIN) 1,250 mg in sodium chloride 0.9 % 250 mL IVPB     1,250 mg 166.7 mL/hr over 90 Minutes Intravenous  Once 08/30/18 1545 08/30/18 1745   08/30/18 1530  vancomycin (VANCOCIN) IVPB 1000 mg/200 mL premix  Status:  Discontinued     1,000 mg 200 mL/hr over 60 Minutes Intravenous Every 24 hours 08/30/18 1523 08/30/18 1551   08/30/18 1400  piperacillin-tazobactam (ZOSYN) IVPB 3.375 g     3.375 g 12.5 mL/hr over 240 Minutes Intravenous Every 8 hours 08/30/18 1047     08/29/18 1000  cefTRIAXone (ROCEPHIN) 1 g in sodium chloride 0.9 % 100 mL IVPB  Status:  Discontinued     1 g 200 mL/hr over 30 Minutes Intravenous Every 24 hours 08/29/18 0915 08/30/18 1047   08/28/18 0745  cefTRIAXone (ROCEPHIN) 1 g in sodium chloride 0.9 % 100 mL IVPB  Status:  Discontinued     1 g 200 mL/hr over 30 Minutes Intravenous Every 24 hours 08/28/18 0730 08/28/18 1246   08/27/18 2000  ceFAZolin (ANCEF) 3 g in dextrose 5 % 50 mL IVPB     3 g 100 mL/hr over 30 Minutes Intravenous Every 6 hours 08/27/18 1716 08/28/18 0949   08/27/18 1400  ceFAZolin (ANCEF) IVPB 2g/100 mL premix     2  g 200 mL/hr over 30 Minutes Intravenous  Once 08/26/18 1137     08/27/18 1231  ceFAZolin (ANCEF) 2-4 GM/100ML-% IVPB  Status:  Discontinued    Note to Pharmacy:  Rayann Heman   : cabinet override      08/27/18 1231  08/27/18 1248      REVIEW OF SYSTEMS:  Const: negative fever, negative chills, negative weight loss Eyes: negative diplopia or visual changes, negative eye pain ENT: negative coryza, negative sore throat Resp: negative cough, hemoptysis, dyspnea Cards: negative for chest pain, palpitations, lower extremity edema GU: negative for frequency, dysuria and hematuria GI: Negative for abdominal pain, diarrhea, bleeding, constipation Skin: negative for rash and pruritus Heme: negative for easy bruising and gum/nose bleeding MS: has weakness, h/o falls Wound rt toe Neurolo:negative for headaches, dizziness, vertigo, memory problems  Psych: negative for feelings of anxiety, depression  Endocrine: no polyuria /polydipsia Allergy/Immunology- negative for any medication or food allergies ? Pertinent Positives include : Objective:  VITALS:  BP (!) 144/93 (BP Location: Left Arm)   Pulse 75   Temp 99.4 F (37.4 C) (Oral)   Resp 20   Ht 5\' 10"  (1.778 m)   Wt (!) 176.2 kg   SpO2 95%   BMI 55.74 kg/m  PHYSICAL EXAM:  General: Alert, cooperative, no distress, appears stated age.  Head: Normocephalic, without obvious abnormality, atraumatic. Eyes: Conjunctivae clear, anicteric sclerae. Pupils are equal ENT Nares normal. No drainage or sinus tenderness. Lips, mucosa, and tongue normal. No Thrush Neck: Supple, symmetrical, no adenopathy, thyroid: non tender no carotid bruit and no JVD. Back: No CVA tenderness. Lungs: b/l air entry- basal crepts Heart:s1s2 Abdomen: Soft, non-tender,not distended. Bowel sounds normal. No masses Extremities: edema legs,left leg in brace Rt great toe wound-on the dorsal surface- clean no signs of infection    Skin: bruising and  thinning Lymph: Cervical, supraclavicular normal. Neurologic: Grossly non-focal Pertinent Labs Lab Results CBC    Component Value Date/Time   WBC 11.4 (H) 08/31/2018 0332   RBC 2.82 (L) 08/31/2018 0332   HGB 8.7 (L) 08/31/2018 0332   HGB 13.0 11/30/2012 1708   HCT 27.9 (L) 08/31/2018 0332   HCT 38.0 (L) 11/30/2012 1708   PLT 163 08/31/2018 0332   PLT 136 (L) 11/30/2012 1708   MCV 98.9 08/31/2018 0332   MCV 92 11/30/2012 1708   MCH 30.9 08/31/2018 0332   MCHC 31.2 08/31/2018 0332   RDW 16.5 (H) 08/31/2018 0332   RDW 14.6 (H) 11/30/2012 1708   LYMPHSABS 0.9 08/26/2018 0821   LYMPHSABS 2.4 11/30/2012 1708   MONOABS 0.6 08/26/2018 0821   MONOABS 0.8 11/30/2012 1708   EOSABS 0.4 08/26/2018 0821   EOSABS 0.2 11/30/2012 1708   BASOSABS 0.0 08/26/2018 0821   BASOSABS 0.1 11/30/2012 1708    CMP Latest Ref Rng & Units 08/31/2018 08/30/2018 08/29/2018  Glucose 70 - 99 mg/dL 161(W) 960(A) 540(J)  BUN 8 - 23 mg/dL 81(X) 91(Y) 78(G)  Creatinine 0.61 - 1.24 mg/dL 9.56(O) 1.30(Q) 6.57(Q)  Sodium 135 - 145 mmol/L 141 139 137  Potassium 3.5 - 5.1 mmol/L 3.9 4.2 3.9  Chloride 98 - 111 mmol/L 108 108 106  CO2 22 - 32 mmol/L 24 24 25   Calcium 8.9 - 10.3 mg/dL 8.1(L) 8.2(L) 7.9(L)  Total Protein 6.5 - 8.1 g/dL - - -  Total Bilirubin 0.3 - 1.2 mg/dL - - -  Alkaline Phos 38 - 126 U/L - - -  AST 15 - 41 U/L - - -  ALT 0 - 44 U/L - - -      Microbiology: Recent Results (from the past 240 hour(s))  Urine culture     Status: None   Collection Time: 08/26/18  2:37 PM  Result Value Ref Range Status   Specimen Description  Final    URINE, RANDOM Performed at [facility], [address], [place], [place] [address]    Special Requests   Final    Normal Performed at [facility], [address] [place], [place] [address]    Culture   Final    NO GROWTH Performed at [facility], [address] [place], [place] [address]    Report Status 08/28/2018 FINAL   Final  Surgical pcr screen     Status: None   Collection Time: 08/26/18  2:38 PM  Result Value Ref Range Status   MRSA, PCR NEGATIVE NEGATIVE Final   Staphylococcus aureus NEGATIVE NEGATIVE Final    Comment: (NOTE) The Xpert SA Assay (FDA approved for NASAL specimens in patients 16 years of age and older), is one component of a comprehensive surveillance program. It is not intended to diagnose infection nor to guide or monitor treatment. Performed at [facility], [address], [place], [place] [address]   Urine Culture     Status: Abnormal   Collection Time: 08/28/18 11:51 AM  Result Value Ref Range Status   Specimen Description   Final    URINE, CLEAN CATCH Performed at [facility], [address], [place], [place] [address]    Special Requests   Final    NONE Performed at [facility], [address] [place], [place] [address]    Culture (A)  Final    60,000 COLONIES/mL >=100,000 COLONIES/mL ENTEROCOCCUS FAECALIS 20,000 COLONIES/mL KLEBSIELLA OXYTOCA Confirmed Extended Spectrum Beta-Lactamase Producer (ESBL).  In bloodstream infections from ESBL organisms, carbapenems are preferred over piperacillin/tazobactam. They are shown to have a lower risk of mortality.    Report Status 08/30/2018 FINAL  Final   Organism ID, Bacteria KLEBSIELLA OXYTOCA (A)  Final   Organism ID, Bacteria ENTEROCOCCUS FAECALIS (A)  Final      Susceptibility   Enterococcus faecalis - MIC*    AMPICILLIN <=2 SENSITIVE Sensitive     LEVOFLOXACIN >=8 RESISTANT Resistant     NITROFURANTOIN <=16 SENSITIVE Sensitive     VANCOMYCIN 1 SENSITIVE Sensitive     * 60,000 COLONIES/mL >=100,000 COLONIES/mL ENTEROCOCCUS FAECALIS   Klebsiella oxytoca - MIC*    AMPICILLIN >=32 RESISTANT Resistant     CEFAZOLIN >=64 RESISTANT Resistant     CEFTRIAXONE >=64 RESISTANT Resistant     CIPROFLOXACIN >=4 RESISTANT Resistant     GENTAMICIN >=16 RESISTANT Resistant     IMIPENEM <=0.25  SENSITIVE Sensitive     NITROFURANTOIN 32 SENSITIVE Sensitive     TRIMETH/SULFA >=320 RESISTANT Resistant     AMPICILLIN/SULBACTAM >=32 RESISTANT Resistant     PIP/TAZO 8 SENSITIVE Sensitive     Extended ESBL POSITIVE Resistant     * 20,000 COLONIES/mL KLEBSIELLA OXYTOCA   IMAGING RESULTS: ? Impression/Recommendation 82 y.o. male with a history of CAD recent stents, recent left TKA, CHF, presents to the hospital after a fall.? ? Fall with left distal femur fracture near the prosthetic site.  Status post ORIF and repair of the ruptured quadriceps tendon.  Urinary retention on presentation with enterococcus and Klebsiella in the urine. The catheter has been removed and is able to pass urine now. Recommend post void bladder scan to check for any residual urine. If no residual urine we could stop the Zosyn after 5 days. If residual urine is present then he needs ultrasound of the kidneys to look for any hydronephrosis.  Coronary artery disease with recent STEMI and stenting of the  RCA. On Plavix.Also on atorvastatin.  CHF: On Coreg, Imdur and spironolactone and torsemide and Entresto    ? ___________________________________________________ Discussed with patient, requesting provider Note:  This document was prepared using Dragon voice recognition software and may include unintentional dictation errors.

## 2018-08-31 NOTE — Progress Notes (Signed)
Upon discussion of urination and swelling with pt and his wife. His wife stated that the swelling was increased from last night and wanted a doctor to look at scrotum/penis tonight. Dr. Renae GlossWieting on call tonight, requested that he come to assess swelling. Torsemide had been started earlier today. Dr. Renae GlossWieting stated if he had a chance to leave the ED, then he would come to assess, also ordered flomax. Pt and his wife updated.

## 2018-09-01 ENCOUNTER — Inpatient Hospital Stay: Payer: Medicare Other

## 2018-09-01 DIAGNOSIS — J449 Chronic obstructive pulmonary disease, unspecified: Secondary | ICD-10-CM

## 2018-09-01 LAB — CBC
HCT: 29.5 % — ABNORMAL LOW (ref 39.0–52.0)
Hemoglobin: 9.2 g/dL — ABNORMAL LOW (ref 13.0–17.0)
MCH: 31.4 pg (ref 26.0–34.0)
MCHC: 31.2 g/dL (ref 30.0–36.0)
MCV: 100.7 fL — ABNORMAL HIGH (ref 80.0–100.0)
Platelets: 171 10*3/uL (ref 150–400)
RBC: 2.93 MIL/uL — ABNORMAL LOW (ref 4.22–5.81)
RDW: 16.7 % — ABNORMAL HIGH (ref 11.5–15.5)
WBC: 10.1 10*3/uL (ref 4.0–10.5)
nRBC: 0 % (ref 0.0–0.2)

## 2018-09-01 LAB — PROCALCITONIN: Procalcitonin: 0.13 ng/mL

## 2018-09-01 MED ORDER — CEFAZOLIN SODIUM-DEXTROSE 2-4 GM/100ML-% IV SOLN: 2.0000 g | Freq: Once | INTRAVENOUS | Status: DC

## 2018-09-01 MED ORDER — PIPERACILLIN-TAZOBACTAM 3.375 G IVPB: 3.3750 g | Freq: Three times a day (TID) | INTRAVENOUS | Status: DC

## 2018-09-01 MED ORDER — HYDROCODONE-ACETAMINOPHEN 5-325 MG PO TABS: 1.0000 | ORAL_TABLET | ORAL | 0 refills | Status: DC | PRN

## 2018-09-01 MED ORDER — FUROSEMIDE 10 MG/ML IJ SOLN
40.0000 mg | Freq: Two times a day (BID) | INTRAMUSCULAR | Status: DC
  Administered 2018-09-01 – 2018-09-04 (×7): 40 mg via INTRAVENOUS
  Filled 2018-09-01 (×7): qty 4

## 2018-09-01 MED ORDER — POTASSIUM CHLORIDE CRYS ER 20 MEQ PO TBCR
20.0000 meq | EXTENDED_RELEASE_TABLET | Freq: Every day | ORAL | Status: DC
  Administered 2018-09-02 – 2018-09-04 (×3): 20 meq via ORAL
  Filled 2018-09-01 (×3): qty 1

## 2018-09-01 MED ORDER — TAMSULOSIN HCL 0.4 MG PO CAPS: 0.4000 mg | ORAL_CAPSULE | Freq: Every day | ORAL | 0 refills | Status: DC

## 2018-09-01 NOTE — Progress Notes (Signed)
Plan is for [facility] when medically stable. Clinical Child psychotherapistocial Worker (CSW) [facility] today via [facility] as requested by [employer] admissions coordinator [facility] will be closed tomorrow due to the holiday. Patient's wife Eber JonesCarolyn is aware of above.   Baker Hughes IncorporatedBailey Collin Rengel, LCSW [phone]

## 2018-09-01 NOTE — Progress Notes (Addendum)
[facility] [facility] [place] [facility]   PATIENT NAME: Dylan [name]    MR#:  [id number]  DATE OF BIRTH:  1936/04/12  SUBJECTIVE:  Patient seen and evaluated today morning Currently on oxygen via nasal cannula at 2 L Has cough productive of phlegm Shortness of breath on ambulation and exertion Complains of pain in the scrotum as well as swelling No complaints of chest pain Chest x-ray repeated today shows bilateral pleural effusions with infectious process  REVIEW OF SYSTEMS:    Review of Systems  Constitutional: Negative for fever, chills weight loss HENT: Negative for ear pain, nosebleeds, congestion, facial swelling, rhinorrhea, neck pain, neck stiffness and ear discharge.   Respiratory: Has cough, shortness of breath, wheezing  Cardiovascular: Negative for chest pain, palpitations and leg swelling.  Gastrointestinal: Negative for heartburn, abdominal pain, vomiting, diarrhea or consitpation Genitourinary: Negative for dysuria, urgency, frequency, hematuria Musculoskeletal: Negative for back pain Neurological: Negative for dizziness, seizures, syncope, focal weakness,  numbness and headaches.  Hematological: Does not bruise/bleed easily.  Psychiatric/Behavioral: Negative for hallucinations, confusion, dysphoric mood  Tolerating Diet: yes  DRUG ALLERGIES:  No Known Allergies  VITALS:  Blood pressure (!) 164/87, pulse 71, temperature 98.3 F (36.8 C), temperature source Oral, resp. rate 19, height 5\' 10"  (1.778 m), weight (!) 176.2 kg, SpO2 97 %.  PHYSICAL EXAMINATION:  Constitutional: Appears well-developed and well-nourished. No distress. HENT: Normocephalic. Marland Kitchen. Oropharynx is clear and moist.  Eyes: Conjunctivae and EOM are normal. PERRLA, no scleral icterus.  Neck: Normal ROM. Neck supple. No JVD. No tracheal deviation. CVS: RRR, S1/S2 +, no murmurs, no gallops, no carotid bruit.  Pulmonary: Decreased breath sounds in both lungs, has Rales in the left  lung Bibasilar crepitations heard Abdominal: Soft. BS +,  no distension, tenderness, rebound or guarding.  Genitourinary scrotal edema Musculoskeletal: Extremities with decreased swelling to left distal thigh, skin intact, healied anterior knee incision.  Small bruised areas to great toe on left with dressing on right foot. Wound vac noted. Neuro: Alert. CN 2-12 grossly intact. No focal deficits. Skin: Skin is warm and dry. No rash noted. Psychiatric: Normal mood and affect.   LABORATORY PANEL:   CBC Recent Labs  Lab 09/01/18 0306  WBC 10.1  HGB 9.2*  HCT 29.5*  PLT 171   ------------------------------------------------------------------------------------------------------------------  Chemistries  Recent Labs  Lab 08/26/18 0821  08/31/18 0332  NA 139   < > 141  K 3.9   < > 3.9  CL 103   < > 108  CO2 28   < > 24  GLUCOSE 136*   < > 139*  BUN 38*   < > 35*  CREATININE 1.95*   < > 1.25*  CALCIUM 8.2*   < > 8.1*  AST 38  --   --   ALT 37  --   --   ALKPHOS 73  --   --   BILITOT 0.9  --   --    < > = values in this interval not displayed.   ------------------------------------------------------------------------------------------------------------------  Cardiac Enzymes Recent Labs  Lab 08/26/18 0821  TROPONINI 0.07*   ------------------------------------------------------------------------------------------------------------------  RADIOLOGY:  Dg Chest Port 1 View  Result Date: 09/01/2018 CLINICAL DATA:  82 year old male currently admitted with pneumonia EXAM: PORTABLE CHEST 1 VIEW COMPARISON:  Prior chest x-ray 08/30/2018 FINDINGS: Stable cardiomegaly. Unchanged mediastinal contours. Atherosclerotic calcifications again noted in the transverse aorta. Interval progression of bibasilar airspace opacities with veiling and obscuration of the hemidiaphragms. Findings suggest bilateral layering  pleural effusions with superimposed atelectasis and/or infiltrate. There is  mild background pulmonary vascular congestion and slight fluid along the minor fissure. No pneumothorax. No acute osseous abnormality. IMPRESSION: 1. Slightly worsened bibasilar airspace opacities favored to represent a combination of layering pleural effusions with atelectasis and/or infiltrate. 2. Pulmonary vascular congestion bordering on mild edema. Electronically Signed   By: Malachy Moan M.D.   On: 09/01/2018 10:05   Dg Chest Port 1 View  Result Date: 08/30/2018 CLINICAL DATA:  82 year old male with history of shortness of breath and weakness today. Three days postop following left supracondylar femoral fracture repair. EXAM: PORTABLE CHEST 1 VIEW COMPARISON:  Chest x-ray 08/26/2018. FINDINGS: Lung volumes are slightly low. Patchy ill-defined opacities throughout the mid to lower lungs bilaterally, concerning for areas of infectious consolidation or sequela of recent aspiration. Small bilateral pleural effusions (left greater than right). No evidence of pulmonary edema. Heart size is borderline enlarged. The patient is rotated to the left on today's exam, resulting in distortion of the mediastinal contours and reduced diagnostic sensitivity and specificity for mediastinal pathology. Aortic atherosclerosis. IMPRESSION: 1. Bibasilar opacities concerning for sequela of aspiration or developing infection. 2. Small bilateral pleural effusions (left greater than right). 3. Aortic atherosclerosis. Electronically Signed   By: Trudie Reed M.D.   On: 08/30/2018 15:03     ASSESSMENT AND PLAN:   82 year old male with history of CAD status post PCI of RCA x2 in October 2019 who presented to the emergency room after a fall with a fracture of left femur currently under orthopedic service.  1.  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 Health care associated pneumonia with bilateral pleural  effusion Continue  IV zosyn abx On diuresis with Lasix Pulmonary consult Informed pulmonologist via epic haiku  3.  Enterococcus faecalis and Klebsiella oxytoca urinary tract infection multidrug-resistant  patient on IV Zosyn antibiotic based on culture and sensitivity Infectious disease consultation follow-up appreciated  4.  CAD: Patient evaluated by cardiology and cleared for surgery Continue dual antiplatelet therapy given history of recent STEMI Continue aspirin, statin, Coreg, isosorbide Continue Plavix   5.  Chronic systolic heart failure ejection fraction 30 to 35%  Has scrotal edema and bilateral pleural effusions appears to be in volume overload Will diurese patient with IV Lasix 40 mg q 12hrly Resume Entresto and Aldactone Lasix intravenously for diuresis Had echocardiogram done recently on 06/21/2018  6.  Essential hypertension: Continue isosorbide and Coreg  7. CKD stage 3: Creatinine is at baseline  8. Post operative anemia S/p prbc transfusion Hemoglobin stable  8.  Tobacco abuse Tobacco cessation counseled for 6 minutes Nicotine patch offered but patient does not want it  9.Hypoxia secondary to pneumonia and pleural effusions Continue oxygen via nasal canula Wean oxygen as tolerated  10.  Scrotal edema appears to be secondary to volume overload Lasix intravenously for diuresis Check renal ultrasound to rule out any hydronephrosis  Management plans discussed with the patient and he is in agreement.  CODE STATUS: Full  TOTAL TIME TAKING CARE OF THIS PATIENT: 38 minutes.     POSSIBLE D/C  to SNF in 2-3 days, DEPENDING ON CLINICAL CONDITION.   Ihor Austin M.D on 09/01/2018 at 1:35 PM  Between 7am to 6pm - Pager - [phone] After 6pm go to [email or web address] - Social research officer, government  [facility]  Office  [phone]  CC: Primary care physician; Arlana Pouch,  Jillene Bucksenny C, MD  Note: This dictation was prepared with Dragon dictation along with  smaller phrase technology. Any transcriptional errors that result from this process are unintentional.

## 2018-09-01 NOTE — Progress Notes (Signed)
   Subjective: 5 Days Post-Op Procedure(s) (LRB): OPEN REDUCTION INTERNAL FIXATION (ORIF) SUPRACONDYLAR FEMUR FRACTURE ABOVE PROSTHESIS, QUADRICEPS REPAIR (Left) Patient reports pain as mild in the left leg. Patient is well, and has had no acute complaints or problems Denies any CP, SOB, ABD pain. We will continue therapy today, currently recommending SNF.  Objective: Vital signs in last 24 hours: Temp:  [98.3 F (36.8 C)-99.4 F (37.4 C)] 98.3 F (36.8 C) (12/31 0725) Pulse Rate:  [70-75] 71 (12/31 0954) Resp:  [19-20] 19 (12/30 2335) BP: (144-171)/(80-93) 164/87 (12/31 0954) SpO2:  [93 %-97 %] 97 % (12/31 0725)  Intake/Output from previous day: 12/30 0701 - 12/31 0700 In: 1193 [P.O.:760; I.V.:0.3; IV Piggyback:432.7] Out: 325 [Urine:325] Intake/Output this shift: No intake/output data recorded.  Recent Labs    08/30/18 0440 08/31/18 0332 09/01/18 0306  HGB 8.8* 8.7* 9.2*   Recent Labs    08/31/18 0332 09/01/18 0306  WBC 11.4* 10.1  RBC 2.82* 2.93*  HCT 27.9* 29.5*  PLT 163 171   Recent Labs    08/30/18 0440 08/31/18 0332  NA 139 141  K 4.2 3.9  CL 108 108  CO2 24 24  BUN 37* 35*  CREATININE 1.42* 1.25*  GLUCOSE 134* 139*  CALCIUM 8.2* 8.1*   No results for input(s): LABPT, INR in the last 72 hours.  EXAM General - Patient is Appropriate and Oriented Extremity - Neurovascular intact Sensation intact distally Intact pulses distally Dorsiflexion/Plantar flexion intact No cellulitis present Compartment soft Dressing - dressing C/D/I and no drainage, wound vac intact with minimal bloody output Honeycomb dressings intact wit serosanguinous drainage. Motor Function - intact, moving foot and toes well on exam.   Past Medical History:  Diagnosis Date  . CAD (coronary artery disease)   . Cervicalgia   . CHF (congestive heart failure) (HCC)   . COPD (chronic obstructive pulmonary disease) (HCC)   . Diastolic heart failure (HCC)   . Foot drop, right    . History of kidney stones   . Hyperlipidemia    unspecified  . Hypertension   . Myocardial infarction (HCC)   . Osteoarthritis   . Shoulder pain, left   . Sleep apnea   . 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   Weight as of this encounter: 176.2 kg. Advance diet Up with therapy, 20% weightbearing left leg, must wear knee immobilizer locked in extension.  No knee range of motion at this time.  Hg 9.2, trending up Up with therapy today. Continue with abx per IM/ID for UTI/PNA Discharge to SNF pending clearance from medicine  DVT Prophylaxis - TED hose and SCDs, plavix, Aspirin  T. Cranston Neighborhris Gaines, PA-C [facility] 09/01/2018, 12:06 PM

## 2018-09-01 NOTE — Consult Note (Signed)
Reason for Consult: Dr. Tobi Bastos Referring Physician: Pneumonia  Dylan [name] is an 82 y.o. male.  HPI: Mr. Dylan [name] is an 82 year old gentleman with a past medical history remarkable for hypertension, hyperlipidemia, nephrolithiasis, COPD, congestive heart failure, osteoarthritis, obstructive sleep apnea who recently had a left TKA in September, has had multiple recent falls and presented with a left periprosthetic fracture.  Is now status post ORIF of distal femur.  Has been seen by cardiology, ejection fraction was noted to be 30 to 35%, recent STEMI with resultant PCI/DES been treated with aspirin and Plavix as an outpatient.  He has a known history of COPD and obstructive sleep apnea, is on nocturnal CPAP.  Course has been complicated with urinary tract infection with Enterococcus faecalis and Klebsiella oxytocin, has been seen by infectious disease and is on Zosyn.  Has also had significant edema,  Is presently on diuretic therapy, Aldactone, Entresto.  Patient states that his breathing is doing well.  Presently is receiving a renal ultrasound, in no acute distress  Past Medical History:  Diagnosis Date  . CAD (coronary artery disease)   . Cervicalgia   . CHF (congestive heart failure) (HCC)   . COPD (chronic obstructive pulmonary disease) (HCC)   . Diastolic heart failure (HCC)   . Foot drop, right   . History of kidney stones   . Hyperlipidemia    unspecified  . Hypertension   . Myocardial infarction (HCC)   . Osteoarthritis   . Shoulder pain, left   . Sleep apnea   . Tremor, essential     Past Surgical History:  Procedure Laterality Date  . APPLICATION OF WOUND VAC Right 11/13/2017   Procedure: APPLICATION OF WOUND VAC;  Surgeon: Annice Needy, MD;  Location: [facility] ORS;  Service: Vascular;  Laterality: Right;  . BACK SURGERY  06/2010  . CARDIAC CATHETERIZATION Left 04/30/2016   Procedure: Left Heart Cath and Coronary Angiography;  Surgeon: Laurier Nancy, MD;  Location: [facility] INVASIVE  CV LAB;  Service: Cardiovascular;  Laterality: Left;  . COLONOSCOPY    . CORONARY ANGIOPLASTY    . CORONARY/GRAFT ACUTE MI REVASCULARIZATION N/A 06/21/2018   Procedure: Coronary/Graft Acute MI Revascularization;  Surgeon: Iran Ouch, MD;  Location: [facility] INVASIVE CV LAB;  Service: Cardiovascular;  Laterality: N/A;  . KNEE ARTHROPLASTY Left 06/01/2018   Procedure: COMPUTER ASSISTED TOTAL KNEE ARTHROPLASTY;  Surgeon: Donato Heinz, MD;  Location: [facility] ORS;  Service: Orthopedics;  Laterality: Left;  . KNEE ARTHROSCOPY Left   . KNEE SURGERY Left 1998  . LEFT HEART CATH AND CORONARY ANGIOGRAPHY N/A 06/21/2018   Procedure: LEFT HEART CATH AND CORONARY ANGIOGRAPHY;  Surgeon: Iran Ouch, MD;  Location: [facility] INVASIVE CV LAB;  Service: Cardiovascular;  Laterality: N/A;  . ORIF FEMUR FRACTURE Left 08/27/2018   Procedure: OPEN REDUCTION INTERNAL FIXATION (ORIF) SUPRACONDYLAR FEMUR FRACTURE ABOVE PROSTHESIS, QUADRICEPS REPAIR;  Surgeon: Kennedy Bucker, MD;  Location: [facility] ORS;  Service: Orthopedics;  Laterality: Left;  . TONSILLECTOMY    . WOUND DEBRIDEMENT Right 11/13/2017   Procedure: DEBRIDEMENT WOUND;  Surgeon: Annice Needy, MD;  Location: [facility] ORS;  Service: Vascular;  Laterality: Right;    Family History  Problem Relation Age of Onset  . Diabetes Son   . Cancer Mother   . Colon cancer Mother   . Lung cancer Mother   . Tremor Father   . Heart disease Father   . Tremor Brother   . Bladder Cancer Brother   . Tremor Sister  Social History:  reports that he has quit smoking. His smoking use included cigarettes. He has a 14.50 pack-year smoking history. He has quit using smokeless tobacco. He reports that he does not drink alcohol or use drugs.  Allergies: No Known Allergies  Medications: I have reviewed the patient's current medications.  Results for orders placed or performed during the hospital encounter of 08/26/18 (from the past 48 hour(s))  CBC     Status: Abnormal    Collection Time: 08/31/18  3:32 AM  Result Value Ref Range   WBC 11.4 (H) 4.0 - 10.5 K/uL   RBC 2.82 (L) 4.22 - 5.81 MIL/uL   Hemoglobin 8.7 (L) 13.0 - 17.0 g/dL   HCT 16.1 (L) 09.6 - 04.5 %   MCV 98.9 80.0 - 100.0 fL   MCH 30.9 26.0 - 34.0 pg   MCHC 31.2 30.0 - 36.0 g/dL   RDW 40.9 (H) 81.1 - 91.4 %   Platelets 163 150 - 400 K/uL   nRBC 0.0 0.0 - 0.2 %    Comment: Performed at [facility], [address] [place], [place] [address]  Basic metabolic panel     Status: Abnormal   Collection Time: 08/31/18  3:32 AM  Result Value Ref Range   Sodium 141 135 - 145 mmol/L   Potassium 3.9 3.5 - 5.1 mmol/L   Chloride 108 98 - 111 mmol/L   CO2 24 22 - 32 mmol/L   Glucose, Bld 139 (H) 70 - 99 mg/dL   BUN 35 (H) 8 - 23 mg/dL   Creatinine, Ser 6.21 (H) 0.61 - 1.24 mg/dL   Calcium 8.1 (L) 8.9 - 10.3 mg/dL   GFR calc non Af Amer 54 (L) >60 mL/min   GFR calc Af Amer >60 >60 mL/min   Anion gap 9 5 - 15    Comment: Performed at [facility], [address] [place], [place] [address]  Procalcitonin - Baseline     Status: None   Collection Time: 08/31/18  3:32 AM  Result Value Ref Range   Procalcitonin <0.10 ng/mL    Comment:        Interpretation: PCT (Procalcitonin) <= 0.5 ng/mL: Systemic infection (sepsis) is not likely. Local bacterial infection is possible. (NOTE)       Sepsis PCT Algorithm           Lower Respiratory Tract                                      Infection PCT Algorithm    ----------------------------     ----------------------------         PCT < 0.25 ng/mL                PCT < 0.10 ng/mL         Strongly encourage             Strongly discourage   discontinuation of antibiotics    initiation of antibiotics    ----------------------------     -----------------------------       PCT 0.25 - 0.50 ng/mL            PCT 0.10 - 0.25 ng/mL               OR       >80% decrease in PCT            Discourage initiation of  antibiotics      Encourage discontinuation           of antibiotics    ----------------------------     -----------------------------         PCT >= 0.50 ng/mL              PCT 0.26 - 0.50 ng/mL               AND        <80% decrease in PCT             Encourage initiation of                                             antibiotics       Encourage continuation           of antibiotics    ----------------------------     -----------------------------        PCT >= 0.50 ng/mL                  PCT > 0.50 ng/mL               AND         increase in PCT                  Strongly encourage                                      initiation of antibiotics    Strongly encourage escalation           of antibiotics                                     -----------------------------                                           PCT <= 0.25 ng/mL                                                 OR                                        > 80% decrease in PCT                                     Discontinue / Do not initiate                                             antibiotics Performed at [facility], [address], [place], [place] [address]   Procalcitonin     Status: None   Collection Time: 09/01/18  3:06 AM  Result Value Ref Range   Procalcitonin 0.13 ng/mL    Comment:        Interpretation: PCT (Procalcitonin) <= 0.5 ng/mL: Systemic infection (sepsis) is not likely. Local bacterial infection is possible. (NOTE)       Sepsis PCT Algorithm           Lower Respiratory Tract                                      Infection PCT Algorithm    ----------------------------     ----------------------------         PCT < 0.25 ng/mL                PCT < 0.10 ng/mL         Strongly encourage             Strongly discourage   discontinuation of antibiotics    initiation of antibiotics    ----------------------------     -----------------------------       PCT 0.25 - 0.50 ng/mL             PCT 0.10 - 0.25 ng/mL               OR       >80% decrease in PCT            Discourage initiation of                                            antibiotics      Encourage discontinuation           of antibiotics    ----------------------------     -----------------------------         PCT >= 0.50 ng/mL              PCT 0.26 - 0.50 ng/mL               AND        <80% decrease in PCT             Encourage initiation of                                             antibiotics       Encourage continuation           of antibiotics    ----------------------------     -----------------------------        PCT >= 0.50 ng/mL                  PCT > 0.50 ng/mL               AND         increase in PCT                  Strongly encourage                                      initiation of antibiotics    Strongly encourage escalation  of antibiotics                                     -----------------------------                                           PCT <= 0.25 ng/mL                                                 OR                                        > 80% decrease in PCT                                     Discontinue / Do not initiate                                             antibiotics Performed at [facility], [address] [place], [place] [address]   CBC     Status: Abnormal   Collection Time: 09/01/18  3:06 AM  Result Value Ref Range   WBC 10.1 4.0 - 10.5 K/uL   RBC 2.93 (L) 4.22 - 5.81 MIL/uL   Hemoglobin 9.2 (L) 13.0 - 17.0 g/dL   HCT 84.129.5 (L) 32.439.0 - 40.152.0 %   MCV 100.7 (H) 80.0 - 100.0 fL   MCH 31.4 26.0 - 34.0 pg   MCHC 31.2 30.0 - 36.0 g/dL   RDW 02.716.7 (H) 25.311.5 - 66.415.5 %   Platelets 171 150 - 400 K/uL   nRBC 0.0 0.0 - 0.2 %    Comment: Performed at [facility], [address] [place], [place] [address]    Dg Chest Port 1 View  Result Date: 09/01/2018 CLINICAL DATA:  82 year old male currently admitted with pneumonia EXAM:  PORTABLE CHEST 1 VIEW COMPARISON:  Prior chest x-ray 08/30/2018 FINDINGS: Stable cardiomegaly. Unchanged mediastinal contours. Atherosclerotic calcifications again noted in the transverse aorta. Interval progression of bibasilar airspace opacities with veiling and obscuration of the hemidiaphragms. Findings suggest bilateral layering pleural effusions with superimposed atelectasis and/or infiltrate. There is mild background pulmonary vascular congestion and slight fluid along the minor fissure. No pneumothorax. No acute osseous abnormality. IMPRESSION: 1. Slightly worsened bibasilar airspace opacities favored to represent a combination of layering pleural effusions with atelectasis and/or infiltrate. 2. Pulmonary vascular congestion bordering on mild edema. Electronically Signed   By: Malachy MoanHeath  McCullough M.D.   On: 09/01/2018 10:05    ROS  Please see HPI for pertinent positives Blood pressure (!) 172/95, pulse 68, temperature 97.9 F (36.6 C), temperature source Oral, resp. rate 19, height 5\' 10"  (1.778 m), weight (!) 176.2 kg, SpO2 100 %. Physical Exam Vital signs: Please see the above listed vital signs HEENT: Trachea is midline, no accessory muscle utilization,jugular venous distention is appreciated Cardiovascular: Irregular rhythm appreciated with  controlled ventricular response, systolic murmur left parasternal border Pulmonary: Crackles appreciated right basilar lung fields, no wheezing appreciated at this time Abdominal exam: Positive bowel sounds, soft exam, Extremities: Left knee in brace appreciated, generalized edema is appreciated Neurologic exam: Cranial is grossly intact.  Patient moves all extremities no focal deficits noted Cutaneous: No rashes or lesions noted  Pertinent labs reveal hemoglobin 9.2, platelet count 171, BUN 35, creatinine 1.25.  Chest x-ray reveals cardiomegaly, bilateral lower lobe parenchymal infiltrates.  The easily could be secondary to fluid status and not  pneumonia.  Patient is afebrile, stable white count, completing a course of Zosyn.  Assessment/Plan: Abnormal chest x-ray.  Multifactorial etiology.  Patient has underlying history of reduced ejection fraction with generalized edema.  Lower lobe infiltrates along with cardiomegaly widened vascular pedicle width, vascular congestion could be due to pulmonary edema.  Would recommend diuresis as tolerated.  Patient is completing a course of Zosyn for complicated urinary tract infection will also cover most pulmonary pathogens.  Patient has a history of COPD.  Presently on Anoro, with rescue albuterol with reasonable coverage.  Has a history of obstructive sleep apnea.  Would recommend nocturnal CPAP.  Of course incentive spirometry, as needed bronchodilators early ambulation per orthopedics.  No other recommendations at this time will sign off  Joeangel Jeanpaul 09/01/2018, 5:02 PM

## 2018-09-01 NOTE — Discharge Summary (Signed)
Physician Discharge Summary  Patient ID: AMIER HOYT MRN: [id number] DOB/AGE: [birth year] 82 y.o.  Admit date: 08/26/2018 Discharge date: 09/02/2018 Admission Diagnoses:  SOB (shortness of breath) [R06.02] Fall [W19.XXXA] AKI (acute kidney injury) (HCC) [N17.9] Fall, initial encounter [W19.XXXA] Closed fracture of left femur, unspecified fracture morphology, unspecified portion of femur, initial encounter [facility]) [S72.92XA]   Discharge Diagnoses: Patient Active Problem List   Diagnosis Date Noted  . Periprosthetic fracture around internal prosthetic left knee joint 08/26/2018  . Acute ST elevation myocardial infarction (STEMI) of inferior wall (HCC) 06/21/2018  . S/P total knee arthroplasty 06/01/2018  . Primary osteoarthritis of left knee 02/05/2018  . Sepsis (HCC) 01/10/2018  . Pressure injury of skin 12/02/2017  . Community acquired pneumonia 12/01/2017  . Pneumonia 12/01/2017  . Hypertension 09/23/2017  . Hyperlipidemia 09/23/2017  . Hematoma of leg, right, subsequent encounter 09/22/2017  . Blood blister 09/22/2017  . Weakness 08/31/2017  . Benign essential tremor 10/18/2016  . Class 3 severe obesity due to excess calories with serious comorbidity and body mass index (BMI) of 45.0 to 49.9 in adult (HCC) 10/18/2016  . Varicose veins of lower extremities with ulcer (HCC) 06/17/2016  . Chronic venous insufficiency 06/17/2016  . Lymphedema 06/17/2016  . Acute on chronic diastolic CHF (congestive heart failure) (HCC) 06/10/2016  . COPD, severe (HCC) 05/24/2016  . AV block, Mobitz 1 02/09/2016  . Coronary atherosclerosis of native coronary artery 02/09/2016  . ST elevation MI (STEMI) (HCC) 02/08/2016  . CHF (congestive heart failure) (HCC) 12/15/2015  . Right foot drop 04/07/2014  . Rotator cuff rupture, complete 04/22/2013  . Neck pain 04/14/2013    Past Medical History:  Diagnosis Date  . CAD (coronary artery disease)   . Cervicalgia   . CHF (congestive heart failure)  (HCC)   . COPD (chronic obstructive pulmonary disease) (HCC)   . Diastolic heart failure (HCC)   . Foot drop, right   . History of kidney stones   . Hyperlipidemia    unspecified  . Hypertension   . Myocardial infarction (HCC)   . Osteoarthritis   . Shoulder pain, left   . Sleep apnea   . Tremor, essential      Transfusion: none   Consultants (if any): Treatment Team:  Kennedy Bucker, MD Dalia Heading, MD Lynn Ito, MD  Discharged Condition: Improved  Hospital Course: Dylan [name] is an 82 y.o. male who was admitted 08/26/2018 with a diagnosis of coronary artery disease, acute on chronic renal failure, chronic lymphedema, chronic kidney disease stage III, CHF, COPD, right great toe infection, periprosthetic fracture of left knee.  Patient went to the operating room on 08/27/2018 and underwent the above named procedures.    Surgeries: Procedure(s): OPEN REDUCTION INTERNAL FIXATION (ORIF) SUPRACONDYLAR FEMUR FRACTURE ABOVE PROSTHESIS, QUADRICEPS REPAIR on 08/27/2018 Patient tolerated the surgery well. Taken to PACU where she was stabilized and then transferred to the orthopedic floor.  Medicine was consulted for medical conditions and cardiologist was consulted for cardiac clearance, patient cleared for surgery.  Plavix was recommended after surgery.  Patient underwent surgery on 08/27/2018.  On postop day 1 08/28/2018 patient had a large drop in hemoglobin he was given 1 unit of packed red blood cells.  Patient was noted to have UTI and started on Rocephin with urine cultures obtained.  Patient made poor progress of physical therapy is 20% weightbearing on left lower extremity with knee immobilizer locked in extension, no knee range of motion.  On postop day 2 hemoglobin  stable patient urine cultures show gram-negative UTI, Rocephin continued, cultures pending.  Creatinine at baseline.  On postop day 3 urine culture showed enterococcus  and Klebsiella, IV Rocephin  discontinued patient started on IV Zosyn.  Patient was then diagnosed with healthcare associated pneumonia with effusion and started on IV vancomycin along with IV Zosyn and IV Lasix for diuresis.  On postop day 4 patient had some urinary retention and was catheterized.  Infectious disease was consulted and recommended checking bladder scan for postvoid residual and if no significant residual will need 3 to 5 days of IV Zosyn.  If significant residual urine then neurology consult recommended.   Discharge uncertain at this time, will likely discharge tomorrow 09/02/2018.  Discharge summary needed at this time for rehab facility to be able to order medications   Implants: Zimmer walking periprosthetic plate with multiple screws and locking nuts, juggernaut anchors x2  He was given perioperative antibiotics:  Anti-infectives (From admission, onward)   Start     Dose/Rate Route Frequency Ordered Stop   09/01/18 0000  ceFAZolin (ANCEF) 2-4 GM/100ML-% IVPB     2 g 200 mL/hr over 30 Minutes Intravenous  Once 09/01/18 0922 09/01/18 2359   09/01/18 0000  piperacillin-tazobactam (ZOSYN) 3.375 GM/50ML IVPB     3.375 g 12.5 mL/hr over 240 Minutes Intravenous Every 8 hours 09/01/18 0922     08/30/18 2200  vancomycin (VANCOCIN) 1,250 mg in sodium chloride 0.9 % 250 mL IVPB  Status:  Discontinued     1,250 mg 166.7 mL/hr over 90 Minutes Intravenous Every 12 hours 08/30/18 1547 08/31/18 1432   08/30/18 1600  vancomycin (VANCOCIN) 1,250 mg in sodium chloride 0.9 % 250 mL IVPB     1,250 mg 166.7 mL/hr over 90 Minutes Intravenous  Once 08/30/18 1545 08/30/18 1745   08/30/18 1530  vancomycin (VANCOCIN) IVPB 1000 mg/200 mL premix  Status:  Discontinued     1,000 mg 200 mL/hr over 60 Minutes Intravenous Every 24 hours 08/30/18 1523 08/30/18 1551   08/30/18 1400  piperacillin-tazobactam (ZOSYN) IVPB 3.375 g     3.375 g 12.5 mL/hr over 240 Minutes Intravenous Every 8 hours 08/30/18 1047     08/29/18 1000   cefTRIAXone (ROCEPHIN) 1 g in sodium chloride 0.9 % 100 mL IVPB  Status:  Discontinued     1 g 200 mL/hr over 30 Minutes Intravenous Every 24 hours 08/29/18 0915 08/30/18 1047   08/28/18 0745  cefTRIAXone (ROCEPHIN) 1 g in sodium chloride 0.9 % 100 mL IVPB  Status:  Discontinued     1 g 200 mL/hr over 30 Minutes Intravenous Every 24 hours 08/28/18 0730 08/28/18 1246   08/27/18 2000  ceFAZolin (ANCEF) 3 g in dextrose 5 % 50 mL IVPB     3 g 100 mL/hr over 30 Minutes Intravenous Every 6 hours 08/27/18 1716 08/28/18 0949   08/27/18 1400  ceFAZolin (ANCEF) IVPB 2g/100 mL premix     2 g 200 mL/hr over 30 Minutes Intravenous  Once 08/26/18 1137     08/27/18 1231  ceFAZolin (ANCEF) 2-4 GM/100ML-% IVPB  Status:  Discontinued    Note to Pharmacy:  Rayann HemanKizziah, Kaitlin   : cabinet override      08/27/18 1231 08/27/18 1248    .  He was given sequential compression devices, early ambulation, and aspirin and Plavix for DVT prophylaxis.  He benefited maximally from the hospital stay and there were no complications.    Recent vital signs:  Vitals:   08/31/18 2335  09/01/18 0725  BP: (!) 157/80 (!) 171/89  Pulse: 70 74  Resp: 19   Temp: 98.4 F (36.9 C) 98.3 F (36.8 C)  SpO2: 93% 97%    Recent laboratory studies:  Lab Results  Component Value Date   HGB 9.2 (L) 09/01/2018   HGB 8.7 (L) 08/31/2018   HGB 8.8 (L) 08/30/2018   Lab Results  Component Value Date   WBC 10.1 09/01/2018   PLT 171 09/01/2018   Lab Results  Component Value Date   INR 1.18 05/20/2018   Lab Results  Component Value Date   NA 141 08/31/2018   K 3.9 08/31/2018   CL 108 08/31/2018   CO2 24 08/31/2018   BUN 35 (H) 08/31/2018   CREATININE 1.25 (H) 08/31/2018   GLUCOSE 139 (H) 08/31/2018    Discharge Medications:   Allergies as of 09/01/2018   No Known Allergies     Medication List    TAKE these medications   acetaminophen 500 MG tablet Commonly known as:  TYLENOL Take 1,000 mg by mouth 2 (two) times  daily as needed for moderate pain.   aspirin EC 81 MG tablet Take 81 mg by mouth daily.   atorvastatin 80 MG tablet Commonly known as:  LIPITOR Take 1 tablet (80 mg total) by mouth daily. What changed:  how much to take   carvedilol 6.25 MG tablet Commonly known as:  COREG Take 1 tablet (6.25 mg total) by mouth 2 (two) times daily with a meal.   ceFAZolin 2-4 GM/100ML-% IVPB Commonly known as:  ANCEF Inject 100 mLs (2 g total) into the vein once for 1 dose.   CENTROVITE Tabs Take 1 tablet by mouth daily.   clopidogrel 75 MG tablet Commonly known as:  PLAVIX Take 75 mg by mouth daily.   dextromethorphan-guaiFENesin 30-600 MG 12hr tablet Commonly known as:  MUCINEX DM Take 1 tablet by mouth 2 (two) times daily as needed for cough.   docusate sodium 100 MG capsule Commonly known as:  COLACE Take 300 mg by mouth at bedtime.   ENTRESTO 24-26 MG Generic drug:  sacubitril-valsartan Take 1 tablet by mouth 2 (two) times daily.   gabapentin 400 MG capsule Commonly known as:  NEURONTIN Take 400 mg by mouth at bedtime.   HYDROcodone-acetaminophen 5-325 MG tablet Commonly known as:  NORCO/VICODIN Take 1 tablet by mouth every 4 (four) hours as needed for moderate pain.   ipratropium-albuterol 0.5-2.5 (3) MG/3ML Soln Commonly known as:  DUONEB Take 3 mLs by nebulization every 6 (six) hours as needed (shortness of breath).   isosorbide mononitrate 30 MG 24 hr tablet Commonly known as:  IMDUR Take 30 mg by mouth daily.   ketotifen 0.025 % ophthalmic solution Commonly known as:  ZADITOR Place 1 drop into both eyes 2 (two) times daily.   loratadine 10 MG tablet Commonly known as:  CLARITIN Take 10 mg by mouth daily as needed for allergies.   nitroGLYCERIN 0.3 MG SL tablet Commonly known as:  NITROSTAT Place 0.3 mg under the tongue every 5 (five) minutes as needed for chest pain.   piperacillin-tazobactam 3.375 GM/50ML IVPB Commonly known as:  ZOSYN Inject 50 mLs (3.375  g total) into the vein every 8 (eight) hours.   potassium chloride 10 MEQ tablet Commonly known as:  K-DUR,KLOR-CON Take 10 mEq by mouth daily.   primidone 50 MG tablet Commonly known as:  MYSOLINE Take 200 mg by mouth 2 (two) times daily.   spironolactone 25 MG tablet  Commonly known as:  ALDACTONE Take 0.5 tablets (12.5 mg total) by mouth daily.   tamsulosin 0.4 MG Caps capsule Commonly known as:  FLOMAX Take 1 capsule (0.4 mg total) by mouth daily.   torsemide 20 MG tablet Commonly known as:  DEMADEX Take 20 mg by mouth daily.   umeclidinium-vilanterol 62.5-25 MCG/INH Aepb Commonly known as:  ANORO ELLIPTA Inhale 1 puff into the lungs daily.   VITAMIN C PO Take 1 tablet by mouth daily.       Diagnostic Studies: Dg Pelvis 1-2 Views  Result Date: 08/26/2018 CLINICAL DATA:  Fall.  Left-sided pain. EXAM: PELVIS - 1-2 VIEW COMPARISON:  None. FINDINGS: There is no evidence of pelvic fracture or diastasis. No pelvic bone lesions are seen. IMPRESSION: Negative. Electronically Signed   By: Paulina Fusi M.D.   On: 08/26/2018 10:03   Dg Knee 1-2 Views Left  Result Date: 08/27/2018 CLINICAL DATA:  ORIF of distal left femoral fracture. EXAM: LEFT KNEE - 1-2 VIEW COMPARISON:  August 26, 2018 FINDINGS: A plate has been affixed to the distal femur utilizing multiple screws. Patient is status post knee replacement. Postoperative soft tissue air noted. IMPRESSION: Continued distal femoral fracture. A plate now crosses the fracture line, affixed proximally and distally with multiple screws. Left knee replacement. Electronically Signed   By: Gerome Sam III M.D   On: 08/27/2018 16:22   Ct Head Wo Contrast  Result Date: 08/26/2018 CLINICAL DATA:  Worsening generalized weakness anti coagulated. Falling. EXAM: CT HEAD WITHOUT CONTRAST TECHNIQUE: Contiguous axial images were obtained from the base of the skull through the vertex without intravenous contrast. COMPARISON:  None. FINDINGS:  Brain: Generalized atrophy. Extensive chronic small-vessel ischemic changes of the cerebral hemispheric white matter. Old infarction in right basal ganglia. No sign of acute infarction, mass lesion, hemorrhage, hydrocephalus or extra-axial collection. Vascular: There is atherosclerotic calcification of the major vessels at the base of the brain. Skull: Normal Sinuses/Orbits: Clear/normal Other: None IMPRESSION: No acute finding by CT. Atrophy and extensive chronic small-vessel ischemic changes. 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 Electronically Signed   By: Paulina Fusi M.D.   On: 08/26/2018 09:28   Dg Lumbar Spine 1 View  Result Date: 08/26/2018 CLINICAL DATA:  Fall. Leg pain. EXAM: LUMBAR SPINE - 1 VIEW COMPARISON:  08/05/2016 FINDINGS: Single AP view shows normal spinal alignment. Can not accurately assess for lumbar compression fracture without a lateral projection. IMPRESSION: Single AP view of the lumbar spine shows normal spinal alignment. See above discussion. Electronically Signed   By: Paulina Fusi M.D.   On: 08/26/2018 10:04   Dg Chest Port 1 View  Result Date: 08/30/2018 CLINICAL DATA:  82 year old male with history of shortness of breath and weakness today. Three days postop following left supracondylar femoral fracture repair. EXAM: PORTABLE CHEST 1 VIEW COMPARISON:  Chest x-ray 08/26/2018. FINDINGS: Lung volumes are slightly low. Patchy ill-defined opacities throughout the mid to lower lungs bilaterally, concerning for areas of infectious consolidation or sequela of recent aspiration. Small bilateral pleural effusions (left greater than right). No evidence of pulmonary edema. Heart size is borderline enlarged. The patient is rotated to the left on today's exam, resulting in distortion of the mediastinal contours and reduced diagnostic sensitivity and specificity for mediastinal pathology. Aortic atherosclerosis. IMPRESSION: 1. Bibasilar opacities concerning for sequela of aspiration or  developing infection. 2. Small bilateral pleural effusions (left greater than right). 3. Aortic atherosclerosis. Electronically Signed   By: Trudie Reed M.D.   On: 08/30/2018 15:03  Dg Chest Port 1 View  Result Date: 08/26/2018 CLINICAL DATA:  Left distal femoral periprosthetic fracture. Preoperative assessment. History of coronary artery disease. EXAM: PORTABLE CHEST 1 VIEW COMPARISON:  Chest radiograph 04/23/2018 FINDINGS: Atherosclerotic calcification of the aortic arch. Retrocardiac density with obscuration of the left hemidiaphragm. Mild enlargement of the cardiopericardial silhouette, without edema. IMPRESSION: 1. Retrocardiac airspace opacity with obscuration of the left costophrenic angle, not entirely specific but possibly from layering pleural effusion of passive atelectasis. 2.  Aortic Atherosclerosis (ICD10-I70.0). 3. Mild enlargement of the cardiopericardial silhouette. No appreciable edema. Electronically Signed   By: Gaylyn RongWalter  Liebkemann M.D.   On: 08/26/2018 13:41   Dg Knee Complete 4 Views Left  Result Date: 08/26/2018 CLINICAL DATA:  Pain after a fall. EXAM: LEFT KNEE - COMPLETE 4+ VIEW COMPARISON:  06/01/2018 FINDINGS: Comminuted fracture of the distal femoral metaphysis above the femoral arthroplasty component. Main distal fragment displaced dorsally by about 1.5 cm. No angulation. Fat fluid level in the joint. No fracture of the proximal tibia or fibula identified. IMPRESSION: Comminuted fracture distal femoral metaphysis above the femoral arthroplasty component. Electronically Signed   By: Paulina FusiMark  Shogry M.D.   On: 08/26/2018 10:02   Dg C-arm 1-60 Min-no Report  Result Date: 08/27/2018 Fluoroscopy was utilized by the requesting physician.  No radiographic interpretation.   Dg Femur Min 2 Views Left  Result Date: 08/26/2018 CLINICAL DATA:  Fall. Pain and deformity. EXAM: LEFT FEMUR 2 VIEWS COMPARISON:  None. FINDINGS: No proximal injury. As noted on the knee exam, there is  a comminuted fracture of the distal femoral metaphysis above the femoral arthroplasty component. Main distal fragment is displaced dorsally about 1.5 cm. Fat fluid level in the knee joint. IMPRESSION: Comminuted displaced fracture of the distal femoral metaphysis above the femoral arthroplasty component. Electronically Signed   By: Paulina FusiMark  Shogry M.D.   On: 08/26/2018 10:03    Disposition:     Contact information for after-discharge care    Destination    [facility] .   Service:  Skilled Nursing Contact information: [address] [place] [id number] [phone]               Signed: Patience MuscaGAINES, THOMAS CHRISTOPHER 09/01/2018, 9:31 AM

## 2018-09-01 NOTE — Progress Notes (Signed)
Physical Therapy Treatment Patient Details Name: Dylan [name] MRN: [id number] DOB: [birth year] Today's Date: 09/01/2018    History of Present Illness Pt is 82 yo male s/p ORIF supracondylar femur fx above prosthesis, quadriceps repair. post PCI of RCA x2 in October 2019 who presented to the emergency room after a fall, fracture of left femur. PMH of HTN, beign essential tremor, lymphedema, COPD, CHF, R foot drop    PT Comments    Able to complete OOB to chair via total/hoyer lift this date, total assist +2.  Patient pleased with OOB positioning, "it feels good to be out of the bed".  Tolerated lift with minimal reports of pain; good position/protection of L LE, vitals stable and WFL.  Recommend continued use of lift for OOB attempts with nursing as appropriate. Improved alertness and engagement with session once OOB activities introduced; able to participate in long-sitting LE therex, 1x12 with fair activation of L LE musculature. Will continue to progress R LE WBing, mobility in subsequent sessions as appropriate.  Of note, treatment frequency updated to QD at this time due to limited ability to tolerate BID, gradual progression; will reassess and update again as tolerance and ability to participate/progress improves.   Follow Up Recommendations  SNF     Equipment Recommendations       Recommendations for Other Services       Precautions / Restrictions Precautions Required Braces or Orthoses: Knee Immobilizer - Left Knee Immobilizer - Left: On at all times;Other (comment) Restrictions Weight Bearing Restrictions: Yes LLE Weight Bearing: Partial weight bearing LLE Partial Weight Bearing Percentage or Pounds: 20 Other Position/Activity Restrictions: knee brace locked in extension at all times, no ROM exercises on L knee    Mobility  Bed Mobility Overal bed mobility: Needs Assistance Bed Mobility: Rolling Rolling: Max assist;+2 for physical assistance             Transfers Overall transfer level: Needs assistance               General transfer comment: bed/chair via total lift, total assist +2 for safety; constant assist for position/protection of L LE  Ambulation/Gait             General Gait Details: unsafe/unable   Stairs             Wheelchair Mobility    Modified Rankin (Stroke Patients Only)       Balance                                            Cognition Arousal/Alertness: Awake/alert Behavior During Therapy: WFL for tasks assessed/performed Overall Cognitive Status: Within Functional Limits for tasks assessed                                        Exercises Other Exercises Other Exercises: L LE brace adjusted for improved position/fit/protection to L knee joint; dep assist Other Exercises: Rolling bilat, max sasist +2 Other Exercises: Bed/chair via hoyer/overhead lift, total assist +2--tolerated lift well with minimal reports of pain; L LE well supported and protected; vitals stable and WFL.  Patient pleased with OOB positioning, "it feels good to be out of the bed" Other Exercises: Seated LE therex, 1x12, long-sitting: ankle pumps, quad sets, hip abduct/adduct, glut sets.  Fair activation  of L LE musculature.    General Comments        Pertinent Vitals/Pain Pain Assessment: Faces Faces Pain Scale: Hurts even more Pain Location: LLE Pain Descriptors / Indicators: Aching;Grimacing;Guarding Pain Intervention(s): Limited activity within patient's tolerance;Monitored during session;Repositioned;Patient requesting pain meds-RN notified    Home Living                      Prior Function            PT Goals (current goals can now be found in the care plan section) Acute Rehab PT Goals Patient Stated Goal: To get better again  PT Goal Formulation: With patient Time For Goal Achievement: 09/11/18 Potential to Achieve Goals: Good Progress towards PT  goals: Progressing toward goals    Frequency    7X/week      PT Plan Current plan remains appropriate;Frequency needs to be updated    Co-evaluation              AM-PAC PT "6 Clicks" Mobility   Outcome Measure  Help needed turning from your back to your side while in a flat bed without using bedrails?: A Lot Help needed moving from lying on your back to sitting on the side of a flat bed without using bedrails?: A Lot Help needed moving to and from a bed to a chair (including a wheelchair)?: Total Help needed standing up from a chair using your arms (e.g., wheelchair or bedside chair)?: Total Help needed to walk in hospital room?: Total Help needed climbing 3-5 steps with a railing? : Total 6 Click Score: 8    End of Session Equipment Utilized During Treatment: Left knee immobilizer Activity Tolerance: Patient tolerated treatment well Patient left: in chair;with call bell/phone within reach;with chair alarm set Nurse Communication: Patient requests pain meds;Mobility status(use of lift for return to bed) PT Visit Diagnosis: Unsteadiness on feet (R26.81);Muscle weakness (generalized) (M62.81);History of falling (Z91.81);Difficulty in walking, not elsewhere classified (R26.2);Pain Pain - Right/Left: Left Pain - part of body: Knee     Time: [id number] PT Time Calculation (min) (ACUTE ONLY): 31 min  Charges:  $Therapeutic Exercise: 8-22 mins $Therapeutic Activity: 8-22 mins                    Dylan [name], PT, DPT, NCS 09/01/18, 9:42 AM [phone]

## 2018-09-02 LAB — BASIC METABOLIC PANEL
Anion gap: 9 (ref 5–15)
BUN: 41 mg/dL — ABNORMAL HIGH (ref 8–23)
CHLORIDE: 105 mmol/L (ref 98–111)
CO2: 26 mmol/L (ref 22–32)
Calcium: 8.1 mg/dL — ABNORMAL LOW (ref 8.9–10.3)
Creatinine, Ser: 1.47 mg/dL — ABNORMAL HIGH (ref 0.61–1.24)
GFR calc Af Amer: 51 mL/min — ABNORMAL LOW (ref >60)
GFR calc non Af Amer: 44 mL/min — ABNORMAL LOW (ref >60)
Glucose, Bld: 128 mg/dL — ABNORMAL HIGH (ref 70–99)
Potassium: 3.3 mmol/L — ABNORMAL LOW (ref 3.5–5.1)
Sodium: 140 mmol/L (ref 135–145)

## 2018-09-02 LAB — CBC
HEMATOCRIT: 27 % — AB (ref 39.0–52.0)
HEMOGLOBIN: 8.5 g/dL — AB (ref 13.0–17.0)
MCH: 31.6 pg (ref 26.0–34.0)
MCHC: 31.5 g/dL (ref 30.0–36.0)
MCV: 100.4 fL — ABNORMAL HIGH (ref 80.0–100.0)
Platelets: 209 10*3/uL (ref 150–400)
RBC: 2.69 MIL/uL — ABNORMAL LOW (ref 4.22–5.81)
RDW: 16.6 % — ABNORMAL HIGH (ref 11.5–15.5)
WBC: 9.1 10*3/uL (ref 4.0–10.5)
nRBC: 0.4 % — ABNORMAL HIGH (ref 0.0–0.2)

## 2018-09-02 LAB — PROCALCITONIN: Procalcitonin: 0.15 ng/mL

## 2018-09-02 MED ORDER — POTASSIUM CHLORIDE CRYS ER 20 MEQ PO TBCR
40.0000 meq | EXTENDED_RELEASE_TABLET | Freq: Once | ORAL | Status: AC
  Administered 2018-09-02: 40 meq via ORAL
  Filled 2018-09-02: qty 2

## 2018-09-02 NOTE — Progress Notes (Addendum)
   Subjective: 6 Days Post-Op Procedure(s) (LRB): OPEN REDUCTION INTERNAL FIXATION (ORIF) SUPRACONDYLAR FEMUR FRACTURE ABOVE PROSTHESIS, QUADRICEPS REPAIR (Left) Patient reports pain as mild in the left leg. Patient is well, and has had no acute complaints or problems Denies any CP, SOB, ABD pain.  Patient states swelling in scrotum is slowly improving, breathing/coughing is still improving We will continue therapy today, currently recommending SNF.  Objective: Vital signs in last 24 hours: Temp:  [97.9 F (36.6 C)-98.5 F (36.9 C)] 98 F (36.7 C) (01/01 0842) Pulse Rate:  [66-72] 69 (01/01 0842) Resp:  [18-20] 18 (01/01 0842) BP: (144-177)/(78-95) 144/78 (01/01 0842) SpO2:  [98 %-100 %] 99 % (01/01 0842) FiO2 (%):  [2 %] 2 % (01/01 0000)  Intake/Output from previous day: 12/31 0701 - 01/01 0700 In: -  Out: 720 [Urine:720] Intake/Output this shift: No intake/output data recorded.  Recent Labs    08/31/18 0332 09/01/18 0306 09/02/18 0254  HGB 8.7* 9.2* 8.5*   Recent Labs    09/01/18 0306 09/02/18 0254  WBC 10.1 9.1  RBC 2.93* 2.69*  HCT 29.5* 27.0*  PLT 171 209   Recent Labs    08/31/18 0332 09/02/18 0254  NA 141 140  K 3.9 3.3*  CL 108 105  CO2 24 26  BUN 35* 41*  CREATININE 1.25* 1.47*  GLUCOSE 139* 128*  CALCIUM 8.1* 8.1*   No results for input(s): LABPT, INR in the last 72 hours.  EXAM General - Patient is Appropriate and Oriented Extremity - Neurovascular intact Sensation intact distally Intact pulses distally Dorsiflexion/Plantar flexion intact No cellulitis present Compartment soft Dressing - dressing C/D/I and no drainage, wound vac intact with minimal bloody output Honeycomb dressings intact wit serosanguinous drainage. Motor Function - intact, moving foot and toes well on exam.   Past Medical History:  Diagnosis Date  . CAD (coronary artery disease)   . Cervicalgia   . CHF (congestive heart failure) (HCC)   . COPD (chronic  obstructive pulmonary disease) (HCC)   . Diastolic heart failure (HCC)   . Foot drop, right   . History of kidney stones   . Hyperlipidemia    unspecified  . Hypertension   . Myocardial infarction (HCC)   . Osteoarthritis   . Shoulder pain, left   . Sleep apnea   . 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   Weight as of this encounter: 176.2 kg. Advance diet Up with therapy, 20% weightbearing left leg, must wear knee immobilizer locked in extension.  No knee range of motion at this time. Hg 8.5.  Stable Up with therapy today. Continue with abx per IM/ID for UTI/PNA Discharge to SNF pending clearance from medicine  DVT Prophylaxis - TED hose and SCDs, plavix, Aspirin  T. Cranston Neighbor, PA-C [facility] 09/02/2018, 8:55 AM

## 2018-09-02 NOTE — Progress Notes (Signed)
[employer] [facility] [place] at [facility]   PATIENT NAME: Dylan [name]    MR#:  [id number]  DATE OF BIRTH:  April 22, 1936  SUBJECTIVE:  Patient states that he is feeling better scrotal swelling less REVIEW OF SYSTEMS:    Review of Systems  Constitutional: Negative for fever, chills weight loss HENT: Negative for ear pain, nosebleeds, congestion, facial swelling, rhinorrhea, neck pain, neck stiffness and ear discharge.   Respiratory: Has cough, positive shortness of breath, wheezing  Cardiovascular: Negative for chest pain, palpitations and leg swelling.  Gastrointestinal: Negative for heartburn, abdominal pain, vomiting, diarrhea or consitpation Genitourinary: Negative for dysuria, urgency, frequency, hematuria Musculoskeletal: Negative for back pain Neurological: Negative for dizziness, seizures, syncope, focal weakness,  numbness and headaches.  Hematological: Does not bruise/bleed easily.  Psychiatric/Behavioral: Negative for hallucinations, confusion, dysphoric mood  Tolerating Diet: yes  DRUG ALLERGIES:  No Known Allergies  VITALS:  Blood pressure (!) 144/78, pulse 65, temperature 98 F (36.7 C), temperature source Oral, resp. rate 18, height 5\' 10"  (1.778 m), weight (!) 176.2 kg, SpO2 95 %.  PHYSICAL EXAMINATION:  Constitutional: Appears well-developed and well-nourished. No distress. HENT: Normocephalic. Marland Kitchen Oropharynx is clear and moist.  Eyes: Conjunctivae and EOM are normal. PERRLA, no scleral icterus.  Neck: Normal ROM. Neck supple. No JVD. No tracheal deviation. CVS: RRR, S1/S2 +, no murmurs, no gallops, no carotid bruit.  Pulmonary: Decreased breath sounds in both lungs, has Rales in the left lung Bibasilar crepitations heard Abdominal: Soft. BS +,  no distension, tenderness, rebound or guarding.  Genitourinary scrotal edema Musculoskeletal: Extremities with decreased swelling to left distal thigh, skin intact, healied anterior knee incision.  Small bruised  areas to great toe on left with dressing on right foot. Wound vac noted. Neuro: Alert. CN 2-12 grossly intact. No focal deficits. Skin: Skin is warm and dry. No rash noted. Psychiatric: Normal mood and affect.   LABORATORY PANEL:   CBC Recent Labs  Lab 09/02/18 0254  WBC 9.1  HGB 8.5*  HCT 27.0*  PLT 209   ------------------------------------------------------------------------------------------------------------------  Chemistries  Recent Labs  Lab 09/02/18 0254  NA 140  K 3.3*  CL 105  CO2 26  GLUCOSE 128*  BUN 41*  CREATININE 1.47*  CALCIUM 8.1*   ------------------------------------------------------------------------------------------------------------------  Cardiac Enzymes No results for input(s): TROPONINI in the last 168 hours. ------------------------------------------------------------------------------------------------------------------  RADIOLOGY:  US Renal  Result Date: 09/01/2018 CLINICAL DATA:  Hydronephrosis EXAM: RENAL / URINARY TRACT ULTRASOUND COMPLETE COMPARISON:  None. FINDINGS: Right Kidney: Renal measurements: 10.1 x 5.5 x 4.9 cm = volume: 141.5 mL . Echogenicity within normal limits. No hydronephrosis visualized. There are cysts within the right kidney, larger in the midpole measuring 1.8 cm Left Kidney: Renal measurements: 11.2 x 5.5 x 5 cm = volume: 160.2 mL. There is diffuse increased echotexture of the left kidney. No mass or hydronephrosis visualized. Bladder: Not well seen. IMPRESSION: No hydronephrosis is identified bilaterally. Right kidney cysts. Electronically Signed   By: Sherian Rein M.D.   On: 09/01/2018 18:04   Dg Chest Port 1 View  Result Date: 09/01/2018 CLINICAL DATA:  83 year old male currently admitted with pneumonia EXAM: PORTABLE CHEST 1 VIEW COMPARISON:  Prior chest x-ray 08/30/2018 FINDINGS: Stable cardiomegaly. Unchanged mediastinal contours. Atherosclerotic calcifications again noted in the transverse aorta. Interval  progression of bibasilar airspace opacities with veiling and obscuration of the hemidiaphragms. Findings suggest bilateral layering pleural effusions with superimposed atelectasis and/or infiltrate. There is mild background pulmonary vascular congestion and slight fluid along the  minor fissure. No pneumothorax. No acute osseous abnormality. IMPRESSION: 1. Slightly worsened bibasilar airspace opacities favored to represent a combination of layering pleural effusions with atelectasis and/or infiltrate. 2. Pulmonary vascular congestion bordering on mild edema. Electronically Signed   By: Malachy Moan M.D.   On: 09/01/2018 10:05     ASSESSMENT AND PLAN:   83 year old male with history of CAD status post PCI of RCA x2 in October 2019 who presented to the emergency room after a fall with a fracture of left femur currently under orthopedic service.  1.  Periprosthetic fracture around internal prosthetic left knee joint (LEFT SUPRACONDYLAR FEMUR FRACTURE) POD #6ORIF Wound vac mgmt as per ortho team  2. Health care associated pneumonia with bilateral pleural effusion Continue  IV zosyn abx On diuresis with Lasix Pulmonary consult appreciated  3 acute on chronic systolic CHF continue IV Lasix Continue Entresto Continue Coreg  4.  Enterococcus faecalis and Klebsiella oxytoca urinary tract infection multidrug-resistant  patient on IV Zosyn antibiotic based on culture and sensitivity Infectious disease consultation follow-up appreciated  5.  CAD: Patient evaluated by cardiology and cleared for surgery Continue dual antiplatelet therapy given history of recent STEMI Continue aspirin, statin, Coreg, isosorbide Continue Plavix     6.  Essential hypertension: Continue isosorbide and Coreg  7. CKD stage 3: Creatinine is at baseline  8. Post operative anemia S/p prbc transfusion Hemoglobin stable  8.  Tobacco abuse Tobacco cessation counseled for 6 minutes Nicotine patch offered but  patient does not want it  9.Hypoxia secondary to pneumonia and pleural effusions Continue oxygen via nasal canula Wean oxygen as tolerated  10.  Hypokalemia replace potassium Management plans discussed with the patient and he is in agreement.  CODE STATUS: Full  TOTAL TIME TAKING CARE OF THIS PATIENT: 35 minutes.     POSSIBLE D/C  to SNF in 2-3 days, DEPENDING ON CLINICAL CONDITION.   Auburn Bilberry M.D on 09/02/2018 at 2:21 PM  Between 7am to 6pm - Pager - [phone] After 6pm go to [email or web address] - password [email or web address]  [facility] [place] [facility]  Office  [phone]  CC: Primary care physician; [name], MD  Note: This dictation was prepared with Dragon dictation along with smaller phrase technology. Any transcriptional errors that result from this process are unintentional.

## 2018-09-02 NOTE — Progress Notes (Signed)
Physical Therapy Treatment Patient Details Name: Dylan [name] MRN: [id number] DOB: November 28, 1935 Today's Date: 09/02/2018    History of Present Illness Pt is 83 yo male s/p ORIF supracondylar femur fx above prosthesis, quadriceps repair. post PCI of RCA x2 in October 2019 who presented to the emergency room after a fall, fracture of left femur. PMH of HTN, beign essential tremor, lymphedema, COPD, CHF, R foot drop    PT Comments    Pt agreeable to PT; denies pain. Wishes out of bed to chair. Pt participates with LE exercises with assist required throughout. 3 person assist for bed mobility rolling to don brief and lift sling. Use of lift for bed to chair with 3 person assist to position pt well in chair. Pt reports comfort in chair. Continue PT to progress tolerance to activity/exercise and out of bed, progress strength to improve functional mobility.   Follow Up Recommendations  SNF     Equipment Recommendations       Recommendations for Other Services       Precautions / Restrictions Precautions Precautions: Fall;Knee Required Braces or Orthoses: Knee Immobilizer - Left Knee Immobilizer - Left: On at all times;Other (comment) Restrictions Weight Bearing Restrictions: Yes LLE Weight Bearing: Partial weight bearing LLE Partial Weight Bearing Percentage or Pounds: 20 Other Position/Activity Restrictions: knee brace locked in extension at all times, no ROM exercises on L knee    Mobility  Bed Mobility Overal bed mobility: Needs Assistance Bed Mobility: Rolling Rolling: Max assist(+2-3 for roll and placement of brief/equipment)            Transfers Overall transfer level: Needs assistance               General transfer comment: +3 for mechanical overhead lift to don sling, protect LLE and manage equipment as well as positioning pt in chair.   Ambulation/Gait                 Stairs             Wheelchair Mobility    Modified Rankin (Stroke Patients  Only)       Balance                                            Cognition                                              Exercises General Exercises - Lower Extremity Ankle Circles/Pumps: AROM;Left;20 reps;Supine(PROM R (foot drop)) Short Arc Quad: AROM;Right;20 reps;Supine Heel Slides: AAROM;Right;20 reps;Supine Hip ABduction/ADduction: AAROM;Both;10 reps;Supine(2 sets) Straight Leg Raises: AAROM;Both;10 reps;Supine(2 sets)    General Comments        Pertinent Vitals/Pain Pain Assessment: No/denies pain    Home Living                      Prior Function            PT Goals (current goals can now be found in the care plan section) Progress towards PT goals: Progressing toward goals(slowly)    Frequency    7X/week      PT Plan Current plan remains appropriate;Frequency needs to be updated    Co-evaluation  AM-PAC PT "6 Clicks" Mobility   Outcome Measure  Help needed turning from your back to your side while in a flat bed without using bedrails?: Total Help needed moving from lying on your back to sitting on the side of a flat bed without using bedrails?: Total Help needed moving to and from a bed to a chair (including a wheelchair)?: Total Help needed standing up from a chair using your arms (e.g., wheelchair or bedside chair)?: Total Help needed to walk in hospital room?: Total Help needed climbing 3-5 steps with a railing? : Total 6 Click Score: 6    End of Session Equipment Utilized During Treatment: Left knee immobilizer Activity Tolerance: Patient tolerated treatment well Patient left: in chair;with call bell/phone within reach;with family/visitor present   PT Visit Diagnosis: Unsteadiness on feet (R26.81);Muscle weakness (generalized) (M62.81);History of falling (Z91.81);Difficulty in walking, not elsewhere classified (R26.2);Pain Pain - Right/Left: Left Pain - part of body: Knee      Time: 1151-1227 PT Time Calculation (min) (ACUTE ONLY): 36 min  Charges:  $Therapeutic Exercise: 8-22 mins $Therapeutic Activity: 8-22 mins                      Scot Dock, PTA 09/02/2018, 12:36 PM

## 2018-09-03 LAB — BASIC METABOLIC PANEL
Anion gap: 8 (ref 5–15)
BUN: 40 mg/dL — ABNORMAL HIGH (ref 8–23)
CO2: 29 mmol/L (ref 22–32)
Calcium: 8.1 mg/dL — ABNORMAL LOW (ref 8.9–10.3)
Chloride: 104 mmol/L (ref 98–111)
Creatinine, Ser: 1.36 mg/dL — ABNORMAL HIGH (ref 0.61–1.24)
GFR calc Af Amer: 57 mL/min — ABNORMAL LOW (ref >60)
GFR calc non Af Amer: 49 mL/min — ABNORMAL LOW (ref >60)
Glucose, Bld: 145 mg/dL — ABNORMAL HIGH (ref 70–99)
Potassium: 3.6 mmol/L (ref 3.5–5.1)
SODIUM: 141 mmol/L (ref 135–145)

## 2018-09-03 LAB — CBC
HCT: 29 % — ABNORMAL LOW (ref 39.0–52.0)
Hemoglobin: 9.1 g/dL — ABNORMAL LOW (ref 13.0–17.0)
MCH: 31.2 pg (ref 26.0–34.0)
MCHC: 31.4 g/dL (ref 30.0–36.0)
MCV: 99.3 fL (ref 80.0–100.0)
Platelets: 218 10*3/uL (ref 150–400)
RBC: 2.92 MIL/uL — ABNORMAL LOW (ref 4.22–5.81)
RDW: 17 % — ABNORMAL HIGH (ref 11.5–15.5)
WBC: 7.7 10*3/uL (ref 4.0–10.5)
nRBC: 0.3 % — ABNORMAL HIGH (ref 0.0–0.2)

## 2018-09-03 NOTE — Progress Notes (Signed)
[employer] -  at [facility]   PATIENT NAME: Dylan [name]    MR#:  [id number]  DATE OF BIRTH:  11-May-1936  SUBJECTIVE:   Continues to have scrotal swelling but improving slowly   REVIEW OF SYSTEMS:    Review of Systems  Constitutional: Negative for fever, chills weight loss HENT: Negative for ear pain, nosebleeds, congestion, facial swelling, rhinorrhea, neck pain, neck stiffness and ear discharge.   Respiratory: Has cough, positive shortness of breath, wheezing  Cardiovascular: Negative for chest pain, palpitations and leg swelling.  Gastrointestinal: Negative for heartburn, abdominal pain, vomiting, diarrhea or consitpation Genitourinary: Negative for dysuria, urgency, frequency, hematuria Musculoskeletal: Negative for back pain Neurological: Negative for dizziness, seizures, syncope, focal weakness,  numbness and headaches.  Hematological: Does not bruise/bleed easily.  Psychiatric/Behavioral: Negative for hallucinations, confusion, dysphoric mood  Tolerating Diet: yes  DRUG ALLERGIES:  No Known Allergies  VITALS:  Blood pressure (!) 145/99, pulse (!) 57, temperature 97.7 F (36.5 C), temperature source Oral, resp. rate (!) 22, height 5\' 10"  (1.778 m), weight (!) 170 kg, SpO2 100 %.  PHYSICAL EXAMINATION:  Constitutional: Appears well-developed and well-nourished. No distress. HENT: Normocephalic. Marland Kitchen. Oropharynx is clear and moist.  Eyes: Conjunctivae and EOM are normal. PERRLA, no scleral icterus.  Neck: Normal ROM. Neck supple. No JVD. No tracheal deviation. CVS: RRR, S1/S2 +, no murmurs, no gallops, no carotid bruit.  Pulmonary: Decreased breath sounds in both lungs, has Rales in the left lung Bibasilar crepitations heard Abdominal: Soft. BS +,  no distension, tenderness, rebound or guarding.  Genitourinary scrotal edema Musculoskeletal: Extremities with decreased swelling to left distal thigh, skin intact, healied anterior knee incision.  Small  bruised areas to great toe on left with dressing on right foot. Wound vac noted. Neuro: Alert. CN 2-12 grossly intact. No focal deficits. Skin: Skin is warm and dry. No rash noted. Psychiatric: Normal mood and affect.   LABORATORY PANEL:   CBC Recent Labs  Lab 09/03/18 1020  WBC 7.7  HGB 9.1*  HCT 29.0*  PLT 218   ------------------------------------------------------------------------------------------------------------------  Chemistries  Recent Labs  Lab 09/03/18 1020  NA 141  K 3.6  CL 104  CO2 29  GLUCOSE 145*  BUN 40*  CREATININE 1.36*  CALCIUM 8.1*   ------------------------------------------------------------------------------------------------------------------  Cardiac Enzymes No results for input(s): TROPONINI in the last 168 hours. ------------------------------------------------------------------------------------------------------------------  RADIOLOGY:  [place] Renal  Result Date: 09/01/2018 CLINICAL DATA:  Hydronephrosis EXAM: RENAL / URINARY TRACT ULTRASOUND COMPLETE COMPARISON:  None. FINDINGS: Right Kidney: Renal measurements: 10.1 x 5.5 x 4.9 cm = volume: 141.5 mL . Echogenicity within normal limits. No hydronephrosis visualized. There are cysts within the right kidney, larger in the midpole measuring 1.8 cm Left Kidney: Renal measurements: 11.2 x 5.5 x 5 cm = volume: 160.2 mL. There is diffuse increased echotexture of the left kidney. No mass or hydronephrosis visualized. Bladder: Not well seen. IMPRESSION: No hydronephrosis is identified bilaterally. Right kidney cysts. Electronically Signed   By: Sherian ReinWei-Chen  Lin M.D.   On: 09/01/2018 18:04     ASSESSMENT AND PLAN:   83 year old male with history of CAD status post PCI of RCA x2 in October 2019 who presented to the emergency room after a fall with a fracture of left femur currently under orthopedic service.  1.  Periprosthetic fracture around internal prosthetic left knee joint (LEFT SUPRACONDYLAR  FEMUR FRACTURE) POD #7 ORIF Wound vac mgmt as per ortho team  2. Health care associated pneumonia with bilateral pleural effusion  Continue  IV zosyn abx On diuresis with Lasix Pulmonary consult appreciated  3 acute on chronic systolic CHF continue IV Lasix Continue Entresto Continue Coreg  4.  Enterococcus faecalis and Klebsiella oxytoca urinary tract infection multidrug-resistant  patient on IV Zosyn antibiotic based on culture and sensitivity Infectious disease consultation follow-up appreciated After today we will discontinue IV antibiotic  5.  CAD: Patient evaluated by cardiology and cleared for surgery Continue dual antiplatelet therapy given history of recent STEMI Continue aspirin, statin, Coreg, isosorbide Continue Plavix     6.  Essential hypertension: Continue isosorbide and Coreg  7. CKD stage 3: Creatinine is at baseline  8. Post operative anemia S/p prbc transfusion Hemoglobin stable  8.  Tobacco abuse Tobacco cessation counseled  9.Hypoxia secondary to pneumonia and pleural effusions Continue oxygen via nasal canula Wean oxygen as tolerated  10.  Hypokalemia replaced  CODE STATUS: Full  TOTAL TIME TAKING CARE OF THIS PATIENT: 35 minutes.     POSSIBLE D/C  to SNF in 2-3 days, DEPENDING ON CLINICAL CONDITION.   Auburn BilberryShreyang Jurnei Latini M.D on 09/03/2018 at 2:05 PM  Between 7am to 6pm - Pager - [phone] After 6pm go to [email or web address] - password [email or web address]  [facility] [place] [facility]  Office  [phone]  CC: Primary care physician; Jaclyn Shaggyate, [name], MD  Note: This dictation was prepared with Dragon dictation along with smaller phrase technology. Any transcriptional errors that result from this process are unintentional.

## 2018-09-03 NOTE — Progress Notes (Signed)
   Subjective: 7 Days Post-Op Procedure(s) (LRB): OPEN REDUCTION INTERNAL FIXATION (ORIF) SUPRACONDYLAR FEMUR FRACTURE ABOVE PROSTHESIS, QUADRICEPS REPAIR (Left) Patient reports pain as mild in the left leg. Patient is well, and has had no acute complaints or problems Denies any CP, SOB, ABD pain. We will continue therapy today, currently recommending SNF.  Objective: Vital signs in last 24 hours: Temp:  [97.7 F (36.5 C)-98.3 F (36.8 C)] 97.7 F (36.5 C) (01/02 0756) Pulse Rate:  [57-72] 57 (01/02 0756) Resp:  [19-22] 22 (01/02 0756) BP: (141-149)/(79-99) 145/99 (01/02 0756) SpO2:  [95 %-100 %] 100 % (01/02 0756) Weight:  [170 kg] 170 kg (01/01 2337)  Intake/Output from previous day: 01/01 0701 - 01/02 0700 In: 445.8 [I.V.:58; IV Piggyback:387.8] Out: 1000 [Urine:1000] Intake/Output this shift: Total I/O In: -  Out: 100 [Urine:100]  Recent Labs    09/01/18 0306 09/02/18 0254  HGB 9.2* 8.5*   Recent Labs    09/01/18 0306 09/02/18 0254  WBC 10.1 9.1  RBC 2.93* 2.69*  HCT 29.5* 27.0*  PLT 171 209   Recent Labs    09/02/18 0254  NA 140  K 3.3*  CL 105  CO2 26  BUN 41*  CREATININE 1.47*  GLUCOSE 128*  CALCIUM 8.1*   No results for input(s): LABPT, INR in the last 72 hours.  EXAM General - Patient is Appropriate and Oriented Extremity - Neurovascular intact Sensation intact distally Intact pulses distally Dorsiflexion/Plantar flexion intact No cellulitis present Compartment soft Dressing - dressing C/D/I and no drainage, wound vac intact with minimal bloody output Honeycomb dressings intact wit serosanguinous drainage. Motor Function - intact, moving foot and toes well on exam.   Past Medical History:  Diagnosis Date  . CAD (coronary artery disease)   . Cervicalgia   . CHF (congestive heart failure) (HCC)   . COPD (chronic obstructive pulmonary disease) (HCC)   . Diastolic heart failure (HCC)   . Foot drop, right   . History of kidney stones    . Hyperlipidemia    unspecified  . Hypertension   . Myocardial infarction (HCC)   . Osteoarthritis   . Shoulder pain, left   . Sleep apnea   . Tremor, essential    Assessment/Plan:   7 Days Post-Op Procedure(s) (LRB): OPEN REDUCTION INTERNAL FIXATION (ORIF) SUPRACONDYLAR FEMUR FRACTURE ABOVE PROSTHESIS, QUADRICEPS REPAIR (Left) Active Problems:   Periprosthetic fracture around internal prosthetic left knee joint  Estimated body mass index is 53.78 kg/m as calculated from the following:   Height as of this encounter: 5\' 10"  (1.778 m).   Weight as of this encounter: 170 kg. Advance diet Up with therapy, 20% weightbearing left leg, must wear knee immobilizer locked in extension.  No knee range of motion at this time.  Hg 9.2. Labs pending this am Up with therapy today. Continue with abx per IM/ID for UTI/PNA Discharge to SNF pending clearance from medicine  DVT Prophylaxis - TED hose and SCDs, plavix, Aspirin  T. Cranston Neighbor, PA-C [facility] 09/03/2018, 9:43 AM

## 2018-09-03 NOTE — Progress Notes (Signed)
Per MD plan is for patient to D/C to [facility] tomorrow. Per [facility] admissions coordinator at [facility] SNF authorization is good through tomorrow. Patient's wife [name] is aware of above.   [employer], LCSW [phone]

## 2018-09-03 NOTE — Progress Notes (Signed)
Occupational Therapy Treatment Patient Details Name: Dylan [name] MRN: [id number] DOB: Feb 15, 1936 Today's Date: 09/03/2018    History of present illness Pt is an 83 yo male who was admitted for an ORIF supracondylar femur fx above prosthesis, quadriceps repair. post PCI of RCA x2 in October 2019 who presented to the emergency room after a fall, fracture of left femur. PMH of HTN, beign essential tremor, lymphedema, COPD, CHF, R foot drop   OT comments  Pt. was sitting up in recliner chair upon arrival. Pt. education was provided about A/E use for LE ADLs Pt. worked on using a Sports administrator for [employer] training from a seated position, as well as donning, and doffing the right sock. Pt. education was provided about energy conservation techniques for ADLs, IADL tasks,  and pursed lipped breathing techniques. Pt. RUE there. Ex using yellow theraband for elbow flexion, and extension 1-2 sets 10 reps each. Pt. Continues to benefit from OT services for ADL training, A/E training, and pt. education about home modification, and DME. Pt. Continues to benefit from SNF level of care upon discharge. Pt. Could benefit from follow-up OT services at discharge.     Follow Up Recommendations  SNF    Equipment Recommendations  None recommended by OT    Recommendations for Other Services      Precautions / Restrictions Precautions Precautions: Fall;Knee Precaution Comments: monitor O2 Required Braces or Orthoses: Knee Immobilizer - Left Knee Immobilizer - Left: On at all times;Other (comment) Restrictions Weight Bearing Restrictions: Yes LLE Weight Bearing: Partial weight bearing LLE Partial Weight Bearing Percentage or Pounds: 20 Other Position/Activity Restrictions: knee brace locked in extension at all times, no ROM exercises on L knee       Mobility Bed Mobility Overal bed mobility: Needs Assistance Bed Mobility: Rolling Rolling: Mod assist;Max assist            Transfers Overall  transfer level: Needs assistance               General transfer comment: mechanical lift    Balance       Sitting balance - Comments: not tested                                   ADL either performed or assessed with clinical judgement   ADL                       Lower Body Dressing: Maximal assistance;Total assistance                 General ADL Comments: Pt. education was provided about A/E use for ADLs.     Vision       Perception     Praxis      Cognition Arousal/Alertness: Awake/alert Behavior During Therapy: WFL for tasks assessed/performed Overall Cognitive Status: Within Functional Limits for tasks assessed                                          Exercises Other Exercises Other Exercises: yellow thearband exercises for right elbow flexion, and extension 2 set 10 reps   Shoulder Instructions       General Comments      Pertinent Vitals/ Pain       Pain Assessment: No/denies pain Pain Score: 4  Pain Location: LLE Pain Descriptors / Indicators: Aching;Grimacing;Guarding Pain Intervention(s): Limited activity within patient's tolerance;Monitored during session;Repositioned  Home Living                                          Prior Functioning/Environment              Frequency  Min 2X/week        Progress Toward Goals  OT Goals(current goals can now be found in the care plan section)  Progress towards OT goals: OT to reassess next treatment  Acute Rehab OT Goals Patient Stated Goal: To improve with ADLs. OT Goal Formulation: With patient Potential to Achieve Goals: Fair  Plan Discharge plan remains appropriate    Co-evaluation                 AM-PAC OT "6 Clicks" Daily Activity     Outcome Measure   Help from another person eating meals?: None Help from another person taking care of personal grooming?: A Little Help from another person toileting,  which includes using toliet, bedpan, or urinal?: Total Help from another person bathing (including washing, rinsing, drying)?: Total Help from another person to put on and taking off regular upper body clothing?: Total Help from another person to put on and taking off regular lower body clothing?: Total 6 Click Score: 11    End of Session Equipment Utilized During Treatment: Oxygen  OT Visit Diagnosis: Repeated falls (R29.6);Muscle weakness (generalized) (M62.81);History of falling (Z91.81);Pain Pain - Right/Left: Left Pain - part of body: Knee   Activity Tolerance Patient tolerated treatment well   Patient Left with call bell/phone within reach;in chair;with family/visitor present   Nurse Communication          Time: 1315-1340 OT Time Calculation (min): 25 min  Charges: OT General Charges $OT Visit: 1 Visit OT Treatments $Self Care/Home Management : 23-37 mins  Olegario Messier, MS, OTR/L  Olegario Messier 09/03/2018, 3:36 PM

## 2018-09-03 NOTE — Progress Notes (Signed)
Physical Therapy Treatment Patient Details Name: Dylan [name] MRN: [id number] DOB: [birth year] Today's Date: 09/03/2018    History of Present Illness Pt is 83 yo male s/p ORIF supracondylar femur fx above prosthesis, quadriceps repair. post PCI of RCA x2 in October 2019 who presented to the emergency room after a fall, fracture of left femur. PMH of HTN, beign essential tremor, lymphedema, COPD, CHF, R foot drop    PT Comments    Pt in bed, assisted with urinal per his request.  Participated in exercises as described below.  Rolling with mod a x 1 with improved effort by pt today.  Verbal and tactile cues for hand placements and to assist while lift pad was placed.  Mechanical lift transfer to recliner at bedside and positioned to comfort.     Follow Up Recommendations  SNF     Equipment Recommendations       Recommendations for Other Services       Precautions / Restrictions Precautions Precautions: Fall;Knee Precaution Comments: monitor O2 Required Braces or Orthoses: Knee Immobilizer - Left Knee Immobilizer - Left: On at all times;Other (comment) Restrictions Weight Bearing Restrictions: Yes LLE Weight Bearing: Partial weight bearing LLE Partial Weight Bearing Percentage or Pounds: 20    Mobility  Bed Mobility Overal bed mobility: Needs Assistance Bed Mobility: Rolling Rolling: Mod assist;Max assist            Transfers Overall transfer level: Needs assistance               General transfer comment: +3 for mechanical overhead lift to don sling, protect LLE and manage equipment as well as positioning pt in chair.   Ambulation/Gait             General Gait Details: unsafe/unable   Stairs             Wheelchair Mobility    Modified Rankin (Stroke Patients Only)       Balance       Sitting balance - Comments: not tested                                    Cognition Arousal/Alertness: Awake/alert Behavior During  Therapy: WFL for tasks assessed/performed Overall Cognitive Status: Within Functional Limits for tasks assessed                                        Exercises Other Exercises Other Exercises: supine ankle pumps and quad sets.  Further ex deferred as help to get out of bed was available.    General Comments        Pertinent Vitals/Pain Pain Assessment: No/denies pain    Home Living                      Prior Function            PT Goals (current goals can now be found in the care plan section) Progress towards PT goals: Progressing toward goals    Frequency    7X/week      PT Plan Current plan remains appropriate    Co-evaluation              AM-PAC PT "6 Clicks" Mobility   Outcome Measure  Help needed turning from your back to your side while in  a flat bed without using bedrails?: Total Help needed moving from lying on your back to sitting on the side of a flat bed without using bedrails?: Total Help needed moving to and from a bed to a chair (including a wheelchair)?: Total Help needed standing up from a chair using your arms (e.g., wheelchair or bedside chair)?: Total Help needed to walk in hospital room?: Total Help needed climbing 3-5 steps with a railing? : Total 6 Click Score: 6    End of Session Equipment Utilized During Treatment: Left knee immobilizer Activity Tolerance: Patient tolerated treatment well Patient left: in chair;with call bell/phone within reach;with family/visitor present Nurse Communication: Mobility status Pain - Right/Left: Left Pain - part of body: Knee     Time: 1115-5208 PT Time Calculation (min) (ACUTE ONLY): 18 min  Charges:  $Therapeutic Activity: 8-22 mins                     Danielle Dess, PTA 09/03/18, 12:14 PM

## 2018-09-03 NOTE — Care Management Important Message (Signed)
Important Message  Patient Details  Name: Dylan [name] MRN: [id number] Date of Birth: 1936-06-18   Medicare Important Message Given:  Yes    Gwenette Greet, RN 09/03/2018, 1:55 PM

## 2018-09-04 ENCOUNTER — Encounter
Admission: RE | Admit: 2018-09-04 | Discharge: 2018-09-04 | Disposition: A | Discharge status: Home / Self Care | Payer: Medicare Other | Source: Ambulatory Visit | Attending: Internal Medicine | Admitting: Internal Medicine

## 2018-09-04 LAB — BASIC METABOLIC PANEL
Anion gap: 7 (ref 5–15)
BUN: 36 mg/dL — ABNORMAL HIGH (ref 8–23)
CO2: 30 mmol/L (ref 22–32)
Calcium: 8.1 mg/dL — ABNORMAL LOW (ref 8.9–10.3)
Chloride: 104 mmol/L (ref 98–111)
Creatinine, Ser: 1.28 mg/dL — ABNORMAL HIGH (ref 0.61–1.24)
GFR calc Af Amer: >60 mL/min (ref >60)
GFR, EST NON AFRICAN AMERICAN: 53 mL/min — AB (ref >60)
Glucose, Bld: 105 mg/dL — ABNORMAL HIGH (ref 70–99)
Potassium: 3.7 mmol/L (ref 3.5–5.1)
Sodium: 141 mmol/L (ref 135–145)

## 2018-09-04 MED ORDER — POLYETHYLENE GLYCOL 3350 17 G PO PACK: 17.0000 g | PACK | Freq: Every day | ORAL | Status: DC

## 2018-09-04 MED ORDER — HYDROCODONE-ACETAMINOPHEN 5-325 MG PO TABS: 1.0000 | ORAL_TABLET | ORAL | 0 refills | Status: DC | PRN

## 2018-09-04 MED ORDER — POLYETHYLENE GLYCOL 3350 17 G PO PACK: 17.0000 g | PACK | Freq: Every day | ORAL | 0 refills | Status: DC

## 2018-09-04 NOTE — Discharge Summary (Signed)
[facility] - [place] at [facility]  Dylan ClintonJerry B Fellner, 83 y.o., DOB [birth year], MRN [id number]. Admission date: 08/26/2018 Discharge Date 09/04/2018 Primary MD Jaclyn Shaggyate, [name], MD Admitting Physician Kennedy BuckerMichael Menz, MD  Admission Diagnosis  SOB (shortness of breath) [R06.02] Fall [W19.XXXA] AKI (acute kidney injury) (HCC) [N17.9] Fall, initial encounter [W19.XXXA] Closed fracture of left femur, unspecified fracture morphology, unspecified portion of femur, initial encounter [facility](HCC) [S72.92XA]  Discharge Diagnosis   Active Problems:   Periprosthetic fracture around internal prosthetic left knee joint that is post repair Health care pneumonia status post trip Acute on chronic systolic CHF status post treatment Enterococcus faecalis UTI Coronary artery disease Essential hypertension Chronic kidney disease stage III Acute blood loss anemia status post transfusion Tobacco abuse Acute hypoxic respiratory failure  Hospital Course  83 year old male with history of CAD status post PCI of RCA x2 in October 2019 who presented to the emergency room after a fall with a fracture of left femur.  Patient was admitted to the hospital and was seen by orthopedic surgery he underwent repair.  Patient postop was noted to have respiratory failure due to healthcare associated pneumonia.  He was also noticed to have urinary tract infection which was treated.  Patient also developed anemia postop.  He required transfusion.  Patient also had acute exasperation of his CHF which was treated with Lasix. Patient is very deconditioned need of rehab.   Continue oxygen 2 L wean as tolerated       Consults  orthopedic surgery  Significant Tests:  See full reports for all details     Dg Pelvis 1-2 Views  Result Date: 08/26/2018 CLINICAL DATA:  Fall.  Left-sided pain. EXAM: PELVIS - 1-2 VIEW COMPARISON:  None. FINDINGS: There is no evidence of pelvic fracture or diastasis. No pelvic bone lesions are  seen. IMPRESSION: Negative. Electronically Signed   By: Paulina FusiMark  Shogry M.D.   On: 08/26/2018 10:03   Dg Knee 1-2 Views Left  Result Date: 08/27/2018 CLINICAL DATA:  ORIF of distal left femoral fracture. EXAM: LEFT KNEE - 1-2 VIEW COMPARISON:  August 26, 2018 FINDINGS: A plate has been affixed to the distal femur utilizing multiple screws. Patient is status post knee replacement. Postoperative soft tissue air noted. IMPRESSION: Continued distal femoral fracture. A plate now crosses the fracture line, affixed proximally and distally with multiple screws. Left knee replacement. Electronically Signed   By: Gerome Samavid  Williams III M.D   On: 08/27/2018 16:22   Ct Head Wo Contrast  Result Date: 08/26/2018 CLINICAL DATA:  Worsening generalized weakness anti coagulated. Falling. EXAM: CT HEAD WITHOUT CONTRAST TECHNIQUE: Contiguous axial images were obtained from the base of the skull through the vertex without intravenous contrast. COMPARISON:  None. FINDINGS: Brain: Generalized atrophy. Extensive chronic small-vessel ischemic changes of the cerebral hemispheric white matter. Old infarction in right basal ganglia. No sign of acute infarction, mass lesion, hemorrhage, hydrocephalus or extra-axial collection. Vascular: There is atherosclerotic calcification of the major vessels at the base of the brain. Skull: Normal Sinuses/Orbits: Clear/normal Other: None IMPRESSION: No acute finding by CT. Atrophy and extensive chronic small-vessel ischemic changes. 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 Electronically Signed   By: Paulina FusiMark  Shogry M.D.   On: 08/26/2018 09:28   Dg Lumbar Spine 1 View  Result Date: 08/26/2018 CLINICAL DATA:  Fall. Leg pain. EXAM: LUMBAR SPINE - 1 VIEW COMPARISON:  08/05/2016 FINDINGS: Single AP view shows normal spinal alignment. Can not accurately assess for lumbar compression fracture without a lateral projection. IMPRESSION:  Single AP view of the lumbar spine shows normal spinal alignment. See above  discussion. Electronically Signed   By: Paulina Fusi M.D.   On: 08/26/2018 10:04   US Renal  Result Date: 09/01/2018 CLINICAL DATA:  Hydronephrosis EXAM: RENAL / URINARY TRACT ULTRASOUND COMPLETE COMPARISON:  None. FINDINGS: Right Kidney: Renal measurements: 10.1 x 5.5 x 4.9 cm = volume: 141.5 mL . Echogenicity within normal limits. No hydronephrosis visualized. There are cysts within the right kidney, larger in the midpole measuring 1.8 cm Left Kidney: Renal measurements: 11.2 x 5.5 x 5 cm = volume: 160.2 mL. There is diffuse increased echotexture of the left kidney. No mass or hydronephrosis visualized. Bladder: Not well seen. IMPRESSION: No hydronephrosis is identified bilaterally. Right kidney cysts. Electronically Signed   By: Sherian Rein M.D.   On: 09/01/2018 18:04   Dg Chest Port 1 View  Result Date: 09/01/2018 CLINICAL DATA:  83 year old male currently admitted with pneumonia EXAM: PORTABLE CHEST 1 VIEW COMPARISON:  Prior chest x-ray 08/30/2018 FINDINGS: Stable cardiomegaly. Unchanged mediastinal contours. Atherosclerotic calcifications again noted in the transverse aorta. Interval progression of bibasilar airspace opacities with veiling and obscuration of the hemidiaphragms. Findings suggest bilateral layering pleural effusions with superimposed atelectasis and/or infiltrate. There is mild background pulmonary vascular congestion and slight fluid along the minor fissure. No pneumothorax. No acute osseous abnormality. IMPRESSION: 1. Slightly worsened bibasilar airspace opacities favored to represent a combination of layering pleural effusions with atelectasis and/or infiltrate. 2. Pulmonary vascular congestion bordering on mild edema. Electronically Signed   By: Malachy Moan M.D.   On: 09/01/2018 10:05   Dg Chest Port 1 View  Result Date: 08/30/2018 CLINICAL DATA:  83 year old male with history of shortness of breath and weakness today. Three days postop following left supracondylar  femoral fracture repair. EXAM: PORTABLE CHEST 1 VIEW COMPARISON:  Chest x-ray 08/26/2018. FINDINGS: Lung volumes are slightly low. Patchy ill-defined opacities throughout the mid to lower lungs bilaterally, concerning for areas of infectious consolidation or sequela of recent aspiration. Small bilateral pleural effusions (left greater than right). No evidence of pulmonary edema. Heart size is borderline enlarged. The patient is rotated to the left on today's exam, resulting in distortion of the mediastinal contours and reduced diagnostic sensitivity and specificity for mediastinal pathology. Aortic atherosclerosis. IMPRESSION: 1. Bibasilar opacities concerning for sequela of aspiration or developing infection. 2. Small bilateral pleural effusions (left greater than right). 3. Aortic atherosclerosis. Electronically Signed   By: Trudie Reed M.D.   On: 08/30/2018 15:03   Dg Chest Port 1 View  Result Date: 08/26/2018 CLINICAL DATA:  Left distal femoral periprosthetic fracture. Preoperative assessment. History of coronary artery disease. EXAM: PORTABLE CHEST 1 VIEW COMPARISON:  Chest radiograph 04/23/2018 FINDINGS: Atherosclerotic calcification of the aortic arch. Retrocardiac density with obscuration of the left hemidiaphragm. Mild enlargement of the cardiopericardial silhouette, without edema. IMPRESSION: 1. Retrocardiac airspace opacity with obscuration of the left costophrenic angle, not entirely specific but possibly from layering pleural effusion of passive atelectasis. 2.  Aortic Atherosclerosis (ICD10-I70.0). 3. Mild enlargement of the cardiopericardial silhouette. No appreciable edema. Electronically Signed   By: Gaylyn Rong M.D.   On: 08/26/2018 13:41   Dg Knee Complete 4 Views Left  Result Date: 08/26/2018 CLINICAL DATA:  Pain after a fall. EXAM: LEFT KNEE - COMPLETE 4+ VIEW COMPARISON:  06/01/2018 FINDINGS: Comminuted fracture of the distal femoral metaphysis above the femoral  arthroplasty component. Main distal fragment displaced dorsally by about 1.5 cm. No angulation. Fat fluid level in  the joint. No fracture of the proximal tibia or fibula identified. IMPRESSION: Comminuted fracture distal femoral metaphysis above the femoral arthroplasty component. Electronically Signed   By: Paulina Fusi M.D.   On: 08/26/2018 10:02   Dg C-arm 1-60 Min-no Report  Result Date: 08/27/2018 Fluoroscopy was utilized by the requesting physician.  No radiographic interpretation.   Dg Femur Min 2 Views Left  Result Date: 08/26/2018 CLINICAL DATA:  Fall. Pain and deformity. EXAM: LEFT FEMUR 2 VIEWS COMPARISON:  None. FINDINGS: No proximal injury. As noted on the knee exam, there is a comminuted fracture of the distal femoral metaphysis above the femoral arthroplasty component. Main distal fragment is displaced dorsally about 1.5 cm. Fat fluid level in the knee joint. IMPRESSION: Comminuted displaced fracture of the distal femoral metaphysis above the femoral arthroplasty component. Electronically Signed   By: Paulina Fusi M.D.   On: 08/26/2018 10:03       Today   Subjective:   Antionette Char patient doing much better swelling in the scrotum much less Objective:   Blood pressure (!) 146/80, pulse (!) 58, temperature 97.6 F (36.4 C), temperature source Oral, resp. rate 20, height 5\' 10"  (1.778 m), weight (!) 173 kg, SpO2 100 %.  .  Intake/Output Summary (Last 24 hours) at 09/04/2018 1112 Last data filed at 09/04/2018 1007 Gross per 24 hour  Intake 72.12 ml  Output 1625 ml  Net -1552.88 ml    Exam VITAL SIGNS: Blood pressure (!) 146/80, pulse (!) 58, temperature 97.6 F (36.4 C), temperature source Oral, resp. rate 20, height 5\' 10"  (1.778 m), weight (!) 173 kg, SpO2 100 %.  GENERAL:  83 y.o.-year-old patient lying in the bed with no acute distress.  EYES: Pupils equal, round, reactive to light and accommodation. No scleral icterus. Extraocular muscles intact.  HEENT: Head  atraumatic, normocephalic. Oropharynx and nasopharynx clear.  NECK:  Supple, no jugular venous distention. No thyroid enlargement, no tenderness.  LUNGS: Normal breath sounds bilaterally, no wheezing, rales,rhonchi or crepitation. No use of accessory muscles of respiration.  CARDIOVASCULAR: S1, S2 normal. No murmurs, rubs, or gallops.  ABDOMEN: Soft, nontender, nondistended. Bowel sounds present. No organomegaly or mass.  EXTREMITIES: No pedal edema, cyanosis, or clubbing.  NEUROLOGIC: Cranial nerves II through XII are intact. Muscle strength 5/5 in all extremities. Sensation intact. Gait not checked.  PSYCHIATRIC: The patient is alert and oriented x 3.  SKIN: No obvious rash, lesion, or ulcer.   Data Review     CBC w Diff:  Lab Results  Component Value Date   WBC 7.7 09/03/2018   HGB 9.1 (L) 09/03/2018   HGB 13.0 11/30/2012   HCT 29.0 (L) 09/03/2018   HCT 38.0 (L) 11/30/2012   PLT 218 09/03/2018   PLT 136 (L) 11/30/2012   LYMPHOPCT 10 08/26/2018   LYMPHOPCT 25.3 11/30/2012   MONOPCT 7 08/26/2018   MONOPCT 9.0 11/30/2012   EOSPCT 4 08/26/2018   EOSPCT 2.4 11/30/2012   BASOPCT 0 08/26/2018   BASOPCT 0.7 11/30/2012   CMP:  Lab Results  Component Value Date   NA 141 09/04/2018   NA 142 12/02/2012   K 3.7 09/04/2018   K 3.1 (L) 12/02/2012   CL 104 09/04/2018   CL 105 12/02/2012   CO2 30 09/04/2018   CO2 29 12/02/2012   BUN 36 (H) 09/04/2018   BUN 24 (H) 12/02/2012   CREATININE 1.28 (H) 09/04/2018   CREATININE 1.20 12/02/2012   PROT 5.7 (L) 08/26/2018   PROT 6.1 (  L) 12/02/2012   ALBUMIN 3.0 (L) 08/26/2018   ALBUMIN 3.3 (L) 12/02/2012   BILITOT 0.9 08/26/2018   BILITOT 0.8 12/02/2012   ALKPHOS 73 08/26/2018   ALKPHOS 64 12/02/2012   AST 38 08/26/2018   AST 27 12/02/2012   ALT 37 08/26/2018   ALT 37 12/02/2012  .  Micro Results Recent Results (from the past 240 hour(s))  Urine culture     Status: None   Collection Time: 08/26/18  2:37 PM  Result Value Ref  Range Status   Specimen Description   Final    URINE, RANDOM Performed at [facility], [address] [place], [place] [address]    Special Requests   Final    Normal Performed at [facility], [address], [place], [place] [address]    Culture   Final    NO GROWTH Performed at [facility], [address], [place], [place] [address]    Report Status 08/28/2018 FINAL  Final  Surgical pcr screen     Status: None   Collection Time: 08/26/18  2:38 PM  Result Value Ref Range Status   MRSA, PCR NEGATIVE NEGATIVE Final   Staphylococcus aureus NEGATIVE NEGATIVE Final    Comment: (NOTE) The Xpert SA Assay (FDA approved for NASAL specimens in patients 7 years of age and older), is one component of a comprehensive surveillance program. It is not intended to diagnose infection nor to guide or monitor treatment. Performed at [facility], [address] [place], [place] [address]   Urine Culture     Status: Abnormal   Collection Time: 08/28/18 11:51 AM  Result Value Ref Range Status   Specimen Description   Final    URINE, CLEAN CATCH Performed at [facility], [address], [place], [place] [address]    Special Requests   Final    NONE Performed at [facility], [address] [place], [place] [address]    Culture (A)  Final    60,000 COLONIES/mL >=100,000 COLONIES/mL ENTEROCOCCUS FAECALIS 20,000 COLONIES/mL KLEBSIELLA OXYTOCA Confirmed Extended Spectrum Beta-Lactamase Producer (ESBL).  In bloodstream infections from ESBL organisms, carbapenems are preferred over piperacillin/tazobactam. They are shown to have a lower risk of mortality.    Report Status 08/30/2018 FINAL  Final   Organism ID, Bacteria KLEBSIELLA OXYTOCA (A)  Final   Organism ID, Bacteria ENTEROCOCCUS FAECALIS (A)  Final      Susceptibility   Enterococcus faecalis - MIC*    AMPICILLIN <=2 SENSITIVE Sensitive     LEVOFLOXACIN >=8 RESISTANT Resistant      NITROFURANTOIN <=16 SENSITIVE Sensitive     VANCOMYCIN 1 SENSITIVE Sensitive     * 60,000 COLONIES/mL >=100,000 COLONIES/mL ENTEROCOCCUS FAECALIS   Klebsiella oxytoca - MIC*    AMPICILLIN >=32 RESISTANT Resistant     CEFAZOLIN >=64 RESISTANT Resistant     CEFTRIAXONE >=64 RESISTANT Resistant     CIPROFLOXACIN >=4 RESISTANT Resistant     GENTAMICIN >=16 RESISTANT Resistant     IMIPENEM <=0.25 SENSITIVE Sensitive     NITROFURANTOIN 32 SENSITIVE Sensitive     TRIMETH/SULFA >=320 RESISTANT Resistant     AMPICILLIN/SULBACTAM >=32 RESISTANT Resistant     PIP/TAZO 8 SENSITIVE Sensitive     Extended ESBL POSITIVE Resistant     * 20,000 COLONIES/mL KLEBSIELLA OXYTOCA        Code Status Orders  (From admission, onward)         Start     Ordered  08/26/18 1133  Full code  Continuous     08/26/18 1137        Code Status History    Date Active Date Inactive Code Status Order ID Comments User Context   06/21/2018 1321 06/24/2018 2008 Partial Code [id number]  Milagros Loll, MD Inpatient   06/21/2018 0803 06/21/2018 1321 Full Code [id number]  Iran Ouch, MD Inpatient   06/01/2018 2009 06/04/2018 1718 Full Code [id number]  Donato Heinz, MD Inpatient   01/11/2018 0012 01/14/2018 1908 Full Code [id number]  Cammy Copa, MD Inpatient   12/01/2017 1924 12/05/2017 1948 Full Code [id number]  Altamese Dilling, MD Inpatient   08/31/2017 0557 09/05/2017 1959 Full Code [id number]  Arnaldo Natal, MD Inpatient   06/10/2016 1934 06/13/2016 1816 Full Code [id number]  Shaune Pollack, MD Inpatient   12/15/2015 0134 12/16/2015 1507 Full Code [id number]  Ihor Austin, MD ED    Advance Directive Documentation     Most Recent Value  Type of Advance Directive  Healthcare Power of Attorney, Living will  Pre-existing out of facility DNR order (yellow form or pink MOST form)  -  "MOST" Form in Place?  -           Contact information for follow-up providers    [name], MD Follow up in 1  week(s).   Specialty:  Internal Medicine Contact information: [address]   [place] [address] [phone]            Contact information for after-discharge care    Destination    [facility] .   Service:  Skilled Nursing Contact information: [address] [place] [address] [phone]                  Discharge Medications   Allergies as of 09/04/2018   No Known Allergies     Medication List    TAKE these medications   acetaminophen 500 MG tablet Commonly known as:  TYLENOL Take 1,000 mg by mouth 2 (two) times daily as needed for moderate pain.   aspirin EC 81 MG tablet Take 81 mg by mouth daily.   atorvastatin 80 MG tablet Commonly known as:  LIPITOR Take 1 tablet (80 mg total) by mouth daily. What changed:  how much to take   carvedilol 6.25 MG tablet Commonly known as:  COREG Take 1 tablet (6.25 mg total) by mouth 2 (two) times daily with a meal.   CENTROVITE Tabs Take 1 tablet by mouth daily.   clopidogrel 75 MG tablet Commonly known as:  PLAVIX Take 75 mg by mouth daily.   dextromethorphan-guaiFENesin 30-600 MG 12hr tablet Commonly known as:  MUCINEX DM Take 1 tablet by mouth 2 (two) times daily as needed for cough.   docusate sodium 100 MG capsule Commonly known as:  COLACE Take 300 mg by mouth at bedtime.   ENTRESTO 24-26 MG Generic drug:  sacubitril-valsartan Take 1 tablet by mouth 2 (two) times daily.   gabapentin 400 MG capsule Commonly known as:  NEURONTIN Take 400 mg by mouth at bedtime.   HYDROcodone-acetaminophen 5-325 MG tablet Commonly known as:  NORCO/VICODIN Take 1 tablet by mouth every 4 (four) hours as needed for moderate pain.   ipratropium-albuterol 0.5-2.5 (3) MG/3ML Soln Commonly known as:  DUONEB Take 3 mLs by nebulization every 6 (six) hours as needed (shortness of breath).   isosorbide mononitrate 30 MG 24 hr tablet Commonly known as:  IMDUR Take  30 mg  by mouth daily.   ketotifen 0.025 % ophthalmic solution Commonly known as:  ZADITOR Place 1 drop into both eyes 2 (two) times daily.   loratadine 10 MG tablet Commonly known as:  CLARITIN Take 10 mg by mouth daily as needed for allergies.   nitroGLYCERIN 0.3 MG SL tablet Commonly known as:  NITROSTAT Place 0.3 mg under the tongue every 5 (five) minutes as needed for chest pain.   polyethylene glycol packet Commonly known as:  MIRALAX / GLYCOLAX Take 17 g by mouth daily.   potassium chloride 10 MEQ tablet Commonly known as:  K-DUR,KLOR-CON Take 10 mEq by mouth daily.   primidone 50 MG tablet Commonly known as:  MYSOLINE Take 200 mg by mouth 2 (two) times daily.   spironolactone 25 MG tablet Commonly known as:  ALDACTONE Take 0.5 tablets (12.5 mg total) by mouth daily.   tamsulosin 0.4 MG Caps capsule Commonly known as:  FLOMAX Take 1 capsule (0.4 mg total) by mouth daily.   torsemide 20 MG tablet Commonly known as:  DEMADEX Take 20 mg by mouth daily.   umeclidinium-vilanterol 62.5-25 MCG/INH Aepb Commonly known as:  ANORO ELLIPTA Inhale 1 puff into the lungs daily.   VITAMIN C PO Take 1 tablet by mouth daily.          Total Time in preparing paper work, data evaluation and todays exam - 35 minutes  Auburn BilberryShreyang Allia Wiltsey M.D on 09/04/2018 at 11:12 AM [facility]   Office  [phone]

## 2018-09-04 NOTE — Consult Note (Signed)
[facility]                                                      Patient Demographics  Dylan [name], is a 83 y.o. male   MRN: [id number]   DOB - 10-10-1935  Admit Date - 08/26/2018    Outpatient Primary MD for the patient is Jaclyn Shaggyate, [name], MD  Consult requested in the Hospital by Auburn BilberryPatel, Shreyang, MD, On 09/04/2018    Reason for consult: Ulcer right great toe   With History of -  Past Medical History:  Diagnosis Date  . CAD (coronary artery disease)   . Cervicalgia   . CHF (congestive heart failure) (HCC)   . COPD (chronic obstructive pulmonary disease) (HCC)   . Diastolic heart failure (HCC)   . Foot drop, right   . History of kidney stones   . Hyperlipidemia    unspecified  . Hypertension   . Myocardial infarction (HCC)   . Osteoarthritis   . Shoulder pain, left   . Sleep apnea   . Tremor, essential       Past Surgical History:  Procedure Laterality Date  . APPLICATION OF WOUND VAC Right 11/13/2017   Procedure: APPLICATION OF WOUND VAC;  Surgeon: Annice Needyew, Jason S, MD;  Location: [facility] ORS;  Service: Vascular;  Laterality: Right;  . BACK SURGERY  06/2010  . CARDIAC CATHETERIZATION Left 04/30/2016   Procedure: Left Heart Cath and Coronary Angiography;  Surgeon: Laurier NancyShaukat A Khan, MD;  Location: [facility] INVASIVE CV LAB;  Service: Cardiovascular;  Laterality: Left;  . COLONOSCOPY    . CORONARY ANGIOPLASTY    . CORONARY/GRAFT ACUTE MI REVASCULARIZATION N/A 06/21/2018   Procedure: Coronary/Graft Acute MI Revascularization;  Surgeon: Iran OuchArida, Muhammad A, MD;  Location: [facility] INVASIVE CV LAB;  Service: Cardiovascular;  Laterality: N/A;  . KNEE ARTHROPLASTY Left 06/01/2018   Procedure: COMPUTER ASSISTED TOTAL KNEE ARTHROPLASTY;  Surgeon: Donato HeinzHooten, James P, MD;  Location: [facility] ORS;  Service: Orthopedics;  Laterality: Left;  . KNEE ARTHROSCOPY Left   . KNEE SURGERY  Left 1998  . LEFT HEART CATH AND CORONARY ANGIOGRAPHY N/A 06/21/2018   Procedure: LEFT HEART CATH AND CORONARY ANGIOGRAPHY;  Surgeon: Iran OuchArida, Muhammad A, MD;  Location: [facility] INVASIVE CV LAB;  Service: Cardiovascular;  Laterality: N/A;  . ORIF FEMUR FRACTURE Left 08/27/2018   Procedure: OPEN REDUCTION INTERNAL FIXATION (ORIF) SUPRACONDYLAR FEMUR FRACTURE ABOVE PROSTHESIS, QUADRICEPS REPAIR;  Surgeon: Kennedy BuckerMenz, Michael, MD;  Location: [facility] ORS;  Service: Orthopedics;  Laterality: Left;  . TONSILLECTOMY    . WOUND DEBRIDEMENT Right 11/13/2017   Procedure: DEBRIDEMENT WOUND;  Surgeon: Annice Needyew, Jason S, MD;  Location: [facility] ORS;  Service: Vascular;  Laterality: Right;    in for   Chief Complaint  Patient presents with  . Weakness     HPI  Dylan [name]  is a 83 y.o. male, patient is a previous patient of mine of treating him in order him a dropfoot brace in the past.  Unfortunately he has had some problems with injuries as well as a knee replacement and a subsequent leg fracture.  This occurred several days ago.  Has had a history of dropfoot on the right foot with contracture of the digits has had a wound on top of his right great toe for several months since being treated with home health  dressings 3 times a week.    Review of Systems    In addition to the HPI above,  No Fever-chills, No Headache, No changes with Vision or hearing, No problems swallowing food or Liquids, Has chronic cough shortness of breath and wheezing No Abdominal pain, No Nausea or Vommitting, Bowel movements are regular, No Blood in stool or Urine, No dysuria, No new skin rashes or bruises, Recent injury to lower extremity No new weakness, tingling, numbness in any extremity, patient does have chronic dropfoot and weakness to the right lower extremity No recent weight gain or loss, No polyuria, polydypsia or polyphagia, No significant Mental Stressors.  A full 10 point Review of Systems was done, except as stated above,  all other Review of Systems were negative.   Social History Social History   Tobacco Use  . Smoking status: Former Smoker    Packs/day: 0.25    Years: 58.00    Pack years: 14.50    Types: Cigarettes  . Smokeless tobacco: Former NeurosurgeonUser  . Tobacco comment: quit the begginning of septembet 2019  Substance Use Topics  . Alcohol use: No    Alcohol/week: 0.0 standard drinks    Family History Family History  Problem Relation Age of Onset  . Diabetes Son   . Cancer Mother   . Colon cancer Mother   . Lung cancer Mother   . Tremor Father   . Heart disease Father   . Tremor Brother   . Bladder Cancer Brother   . Tremor Sister     Prior to Admission medications   Medication Sig Start Date End Date Taking? Authorizing Provider  acetaminophen (TYLENOL) 500 MG tablet Take 1,000 mg by mouth 2 (two) times daily as needed for moderate pain.   Yes [provider]  Ascorbic Acid (VITAMIN C PO) Take 1 tablet by mouth daily.   Yes [provider]  aspirin EC 81 MG tablet Take 81 mg by mouth daily.   Yes [provider]  atorvastatin (LIPITOR) 80 MG tablet Take 1 tablet (80 mg total) by mouth daily. Patient taking differently: Take 40 mg by mouth daily.  06/24/18 06/24/19 Yes Pyreddy, Vivien RotaPavan, MD  carvedilol (COREG) 6.25 MG tablet Take 1 tablet (6.25 mg total) by mouth 2 (two) times daily with a meal. 06/24/18 08/26/18 Yes Pyreddy, Vivien RotaPavan, MD  clopidogrel (PLAVIX) 75 MG tablet Take 75 mg by mouth daily.   Yes [provider]  dextromethorphan-guaiFENesin (MUCINEX DM) 30-600 MG 12hr tablet Take 1 tablet by mouth 2 (two) times daily as needed for cough.   Yes [provider]  docusate sodium (COLACE) 100 MG capsule Take 300 mg by mouth at bedtime.    Yes [provider]  gabapentin (NEURONTIN) 400 MG capsule Take 400 mg by mouth at bedtime.   Yes [provider]  ipratropium-albuterol (DUONEB) 0.5-2.5 (3) MG/3ML SOLN Take 3 mLs by  nebulization every 6 (six) hours as needed (shortness of breath).    Yes [provider]  isosorbide mononitrate (IMDUR) 30 MG 24 hr tablet Take 30 mg by mouth daily.  05/31/16  Yes [provider]  ketotifen (ZADITOR) 0.025 % ophthalmic solution Place 1 drop into both eyes 2 (two) times daily. 01/14/18  Yes Wieting, Richard, MD  loratadine (CLARITIN) 10 MG tablet Take 10 mg by mouth daily as needed for allergies.   Yes [provider]  Multiple Vitamins-Minerals (CENTROVITE) TABS Take 1 tablet by mouth daily.   Yes [provider]  nitroGLYCERIN (NITROSTAT) 0.3 MG SL tablet Place 0.3 mg under the tongue every 5 (five) minutes as needed for chest pain.   Yes [provider]  potassium chloride (K-DUR,KLOR-CON) 10 MEQ tablet Take 10 mEq by mouth daily.    Yes [provider]  primidone (MYSOLINE) 50 MG tablet Take 200 mg by mouth 2 (two) times daily.    Yes [provider]  sacubitril-valsartan (ENTRESTO) 24-26 MG Take 1 tablet by mouth 2 (two) times daily.   Yes [provider]  spironolactone (ALDACTONE) 25 MG tablet Take 0.5 tablets (12.5 mg total) by mouth daily. 06/25/18 08/26/18 Yes Pyreddy, Vivien Rota, MD  torsemide (DEMADEX) 20 MG tablet Take 20 mg by mouth daily.    Yes [provider]  umeclidinium-vilanterol (ANORO ELLIPTA) 62.5-25 MCG/INH AEPB Inhale 1 puff into the lungs daily. 09/04/17  Yes Shaune Pollack, MD  HYDROcodone-acetaminophen (NORCO/VICODIN) 5-325 MG tablet Take 1 tablet by mouth every 4 (four) hours as needed for moderate pain. 09/01/18   Evon Slack, PA-C  piperacillin-tazobactam (ZOSYN) 3.375 GM/50ML IVPB Inject 50 mLs (3.375 g total) into the vein every 8 (eight) hours. 09/01/18   Evon Slack, PA-C  tamsulosin (FLOMAX) 0.4 MG CAPS capsule Take 1 capsule (0.4 mg total) by mouth daily. 09/01/18   Evon Slack, PA-C    Anti-infectives (From admission, onward)   Start     Dose/Rate Route  Frequency Ordered Stop   09/01/18 0000  ceFAZolin (ANCEF) 2-4 GM/100ML-% IVPB     2 g 200 mL/hr over 30 Minutes Intravenous  Once 09/01/18 0922 09/01/18 2359   09/01/18 0000  piperacillin-tazobactam (ZOSYN) 3.375 GM/50ML IVPB     3.375 g 12.5 mL/hr over 240 Minutes Intravenous Every 8 hours 09/01/18 0922     08/30/18 2200  vancomycin (VANCOCIN) 1,250 mg in sodium chloride 0.9 % 250 mL IVPB  Status:  Discontinued     1,250 mg 166.7 mL/hr over 90 Minutes Intravenous Every 12 hours 08/30/18 1547 08/31/18 1432   08/30/18 1600  vancomycin (VANCOCIN) 1,250 mg in sodium chloride 0.9 % 250 mL IVPB     1,250 mg 166.7 mL/hr over 90 Minutes Intravenous  Once 08/30/18 1545 08/30/18 1745   08/30/18 1530  vancomycin (VANCOCIN) IVPB 1000 mg/200 mL premix  Status:  Discontinued     1,000 mg 200 mL/hr over 60 Minutes Intravenous Every 24 hours 08/30/18 1523 08/30/18 1551   08/30/18 1400  piperacillin-tazobactam (ZOSYN) IVPB 3.375 g     3.375 g 12.5 mL/hr over 240 Minutes Intravenous Every 8 hours 08/30/18 1047 09/04/18 0832   08/29/18 1000  cefTRIAXone (ROCEPHIN) 1 g in sodium chloride 0.9 % 100 mL IVPB  Status:  Discontinued     1 g 200 mL/hr over 30 Minutes Intravenous Every 24 hours 08/29/18 0915 08/30/18 1047   08/28/18 0745  cefTRIAXone (ROCEPHIN) 1 g in sodium chloride 0.9 % 100 mL IVPB  Status:  Discontinued     1 g 200 mL/hr over 30 Minutes Intravenous Every 24 hours 08/28/18 0730 08/28/18 1246   08/27/18 2000  ceFAZolin (ANCEF) 3 g in dextrose 5 % 50 mL IVPB     3 g 100 mL/hr over 30 Minutes Intravenous Every 6 hours 08/27/18 1716 08/28/18 0949   08/27/18 1400  ceFAZolin (ANCEF) IVPB 2g/100 mL premix  Status:  Discontinued     2 g 200 mL/hr over 30 Minutes Intravenous  Once 08/26/18 1137 09/03/18 1512   08/27/18 1231  ceFAZolin (ANCEF) 2-4 GM/100ML-% IVPB  Status:  Discontinued    Note to Pharmacy:  Rayann Heman   : cabinet override      08/27/18 1231 08/27/18 1248      Scheduled  Meds: . aspirin EC  81 mg Oral Daily  . atorvastatin  80 mg Oral Daily  . carvedilol  6.25 mg Oral BID WC  . clopidogrel  75 mg Oral Daily  . docusate sodium  100 mg Oral BID  . ferrous fumarate-b12-vitamic C-folic acid  1 capsule Oral BID PC  . furosemide  40 mg Intravenous BID  . gabapentin  400 mg Oral QHS  . isosorbide mononitrate  30 mg Oral Daily  . ketotifen  1 drop Both Eyes BID  . loratadine  10 mg Oral Daily  . multivitamin with minerals  1 tablet Oral Daily  . potassium chloride  20 mEq Oral Daily  . primidone  200 mg Oral BID  . sacubitril-valsartan  1 tablet Oral BID  . spironolactone  12.5 mg Oral Daily  . tamsulosin  0.4 mg Oral Daily  . umeclidinium-vilanterol  1 puff Inhalation Daily  . vitamin C  500 mg Oral Daily   Continuous Infusions: . sodium chloride Stopped (09/01/18 1102)  . lactated ringers 50 mL/hr at 08/27/18 1241  . methocarbamol (ROBAXIN) IV    . sodium chloride     PRN Meds:.sodium chloride, acetaminophen, alum & mag hydroxide-simeth, bisacodyl, guaiFENesin **AND** dextromethorphan, HYDROcodone-acetaminophen, ipratropium-albuterol, magnesium citrate, magnesium hydroxide, menthol-cetylpyridinium **OR** phenol, methocarbamol **OR** methocarbamol (ROBAXIN) IV, metoCLOPramide **OR** metoCLOPramide (REGLAN) injection, morphine injection, nitroGLYCERIN, ondansetron **OR** ondansetron (ZOFRAN) IV, zolpidem  No Known Allergies  Physical Exam  Vitals  Blood pressure (!) 146/80, pulse (!) 58, temperature 97.6 F (36.4 C), temperature source Oral, resp. rate 20, height 5\' 10"  (1.778 m), weight (!) 173 kg, SpO2 100 %.  Lower Extremity exam:  Vascular: Difficult to palpate at this time off think I can palpate posterior tibial pulse but it is not particular strong deep P pulse on the right was difficult to palpate.  There are palpable on the left side.  Dermatological: Patient has an ulceration on the dorsum of the IP joint of the right hallux approximately  1.5 cm x 1.4 cm with several millimeters of depth.  There is no redness or swelling or infection to the region at this timeframe.  There is no drainage no cellulitis.  Toenail on the left fourth toe is partially avulsed I will go ahead and trim that off today without any real issues or problems with it.  Neurological: Likely peripheral neuropathy  Ortho: Hallux malleus is noted on the right as contracture of all the digits is noted.  The hallux malleus with creates a prominence to the IP joint the right hallux is probably what led to the irritation and ulceration to that area.  Data Review  CBC Recent Labs  Lab 08/30/18 0440 08/31/18 0332 09/01/18 0306 09/02/18 0254 09/03/18 1020  WBC 8.4 11.4* 10.1 9.1 7.7  HGB 8.8* 8.7* 9.2* 8.5* 9.1*  HCT 28.0* 27.9* 29.5* 27.0* 29.0*  PLT 150 163 171 209 218  MCV 97.2 98.9 100.7* 100.4* 99.3  MCH 30.6 30.9 31.4 31.6 31.2  MCHC 31.4 31.2 31.2 31.5 31.4  RDW 16.7* 16.5* 16.7* 16.6* 17.0*   ------------------------------------------------------------------------------------------------------------------  Chemistries  Recent Labs  Lab 08/30/18 0440 08/31/18 0332 09/02/18 0254 09/03/18 1020 09/04/18 0446  NA 139 141 140 141 141  K 4.2 3.9 3.3* 3.6 3.7  CL 108 108 105 104 104  CO2 24 24 26 29 30   GLUCOSE 134* 139* 128* 145* 105*  BUN 37* 35* 41* 40* 36*  CREATININE 1.42* 1.25* 1.47* 1.36* 1.28*  CALCIUM 8.2* 8.1* 8.1* 8.1* 8.1*   --------------------------------------------------------------------------------------------------Assessment & Plan: Stable gradually healing wound to the right great toe.  I recommend just continue with the home health dressings but I recommend he come see me in a couple weeks in the office for evaluation and continuity of care.  Fourth toe should be stable I did put a slight dressing on that toe today also put a wet-to-dry dressing on the great toe.  He is nonambulatory this point I just keep all shoes and  pressure off of the toes at this point and see him back in the office in a couple of weeks. 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

## 2018-09-04 NOTE — Clinical Social Work Placement (Signed)
   CLINICAL SOCIAL WORK PLACEMENT  NOTE  Date:  09/04/2018  Patient Details  Name: Dylan [name] MRN: [id number] Date of Birth: 1936/02/18  Clinical Social Work is seeking post-discharge placement for this patient at the Skilled  Nursing Facility level of care (*CSW will initial, date and re-position this form in  chart as items are completed):  Yes   Patient/family provided with [facility] Social Work Department's list of facilities offering this level of care within the geographic area requested by the patient (or if unable, by the patient's family).  Yes   Patient/family informed of their freedom to choose among providers that offer the needed level of care, that participate in Medicare, Medicaid or managed care program needed by the patient, have an available bed and are willing to accept the patient.  Yes   Patient/family informed of [facility]'s ownership interest in [facility] and [facility], as well as of the fact that they are under no obligation to receive care at these facilities.  PASRR submitted to EDS on       PASRR number received on       Existing PASRR number confirmed on 08/28/18     FL2 transmitted to all facilities in geographic area requested by pt/family on 08/28/18     FL2 transmitted to all facilities within larger geographic area on       Patient informed that his/her managed care company has contracts with or will negotiate with certain facilities, including the following:        Yes   Patient/family informed of bed offers received.  Patient chooses bed at [facility] )     Physician recommends and patient chooses bed at      Patient to be transferred to [facility] ) on 09/04/18.  Patient to be transferred to facility by [facility] )     Patient family notified on 09/04/18 of transfer.  Name of family member notified:  (Patient's wife [name] is aware of D/C today. )     PHYSICIAN       Additional Comment:      _______________________________________________ Laiken Sandy, Darleen Crocker, LCSW 09/04/2018, 12:15 PM

## 2018-09-04 NOTE — Progress Notes (Signed)
Patient wife called and made aware that patient is discharging back to [facility] today. Patient to be transported via EMS to facility.

## 2018-09-04 NOTE — Progress Notes (Signed)
Physical Therapy Treatment Patient Details Name: Dylan [name] MRN: [id number] DOB: 1935-10-22 Today's Date: 09/04/2018    History of Present Illness Pt is an 83 yo male who was admitted for an ORIF supracondylar femur fx above prosthesis, quadriceps repair. post PCI of RCA x2 in October 2019 who presented to the emergency room after a fall, fracture of left femur. PMH of HTN, beign essential tremor, lymphedema, COPD, CHF, R foot drop    PT Comments    Pt is up in bed with HOB elevated for nursing to give meds.  Not enough help to get up to chair was available so worked on significant LE strengthening within limits of wearing L knee immobilizer.  Pt is expecting to be transported to SNF today and nursing is checking with MD to see if that is the plan.  Follow acutely for strengthening and work toward being able to wb on RLE and transfer to standing eventually.  Follow Up Recommendations  SNF     Equipment Recommendations  Other (comment)    Recommendations for Other Services       Precautions / Restrictions Precautions Precautions: Fall;Knee Required Braces or Orthoses: Knee Immobilizer - Left Knee Immobilizer - Left: On at all times;Other (comment) Restrictions Weight Bearing Restrictions: Yes LLE Weight Bearing: Partial weight bearing LLE Partial Weight Bearing Percentage or Pounds: 20 Other Position/Activity Restrictions: knee brace locked in extension at all times, no ROM exercises on L knee    Mobility  Bed Mobility                  Transfers                    Ambulation/Gait                 Stairs             Wheelchair Mobility    Modified Rankin (Stroke Patients Only)       Balance                                            Cognition Arousal/Alertness: Awake/alert Behavior During Therapy: WFL for tasks assessed/performed Overall Cognitive Status: Within Functional Limits for tasks assessed                                         Exercises General Exercises - Lower Extremity Ankle Circles/Pumps: AROM;AAROM;Both;5 reps(holding stretches to increase ROM) Quad Sets: Strengthening;Both;5 reps Gluteal Sets: Strengthening;Both;15 reps Heel Slides: AAROM;AROM;Right;10 reps Hip ABduction/ADduction: AROM;AAROM;Both;10 reps Straight Leg Raises: AAROM;Both;10 reps    General Comments        Pertinent Vitals/Pain Faces Pain Scale: Hurts little more Pain Location: LLE Pain Descriptors / Indicators: Operative site guarding Pain Intervention(s): Limited activity within patient's tolerance;Monitored during session;Premedicated before session;Repositioned    Home Living                      Prior Function            PT Goals (current goals can now be found in the care plan section) Acute Rehab PT Goals Patient Stated Goal: To improve with ADLs. Progress towards PT goals: Progressing toward goals    Frequency    7X/week  PT Plan Current plan remains appropriate    Co-evaluation              AM-PAC PT "6 Clicks" Mobility   Outcome Measure  Help needed turning from your back to your side while in a flat bed without using bedrails?: Total Help needed moving from lying on your back to sitting on the side of a flat bed without using bedrails?: Total Help needed moving to and from a bed to a chair (including a wheelchair)?: Total Help needed standing up from a chair using your arms (e.g., wheelchair or bedside chair)?: Total Help needed to walk in hospital room?: Total Help needed climbing 3-5 steps with a railing? : Total 6 Click Score: 6    End of Session Equipment Utilized During Treatment: Left knee immobilizer Activity Tolerance: Patient tolerated treatment well Patient left: in bed;with call bell/phone within reach;with bed alarm set;with nursing/sitter in room Nurse Communication: Mobility status PT Visit Diagnosis: Unsteadiness on feet  (R26.81);Muscle weakness (generalized) (M62.81);History of falling (Z91.81);Difficulty in walking, not elsewhere classified (R26.2);Pain Pain - Right/Left: Left Pain - part of body: Knee     Time: [id number] PT Time Calculation (min) (ACUTE ONLY): 22 min  Charges:  $Therapeutic Exercise: 8-22 mins                    Ivar DrapeRuth E Dawud Mays 09/04/2018, 11:13 AM   Samul Dadauth Chisom Muntean, PT MS Acute Rehab Dept. Number: [facility] [phone] and [facility] [phone]

## 2018-09-04 NOTE — Progress Notes (Signed)
EMS called to transport patient

## 2018-09-04 NOTE — Progress Notes (Signed)
Patient is medically stable for [facility]. [facility] has been received and patient can come to private room 204. Per [facility] has a bariatric bed and air mattress for him. RN will call report at [phone] and arrange EMS for transport. Clinical Child psychotherapistocial Worker (CSW) sent [facility]C orders [facility] via [employer]. Patient is aware of above. Patient's wife Eber JonesCarolyn is aware of above. Please reconsult if future social work needs arise. CSW signing off.   Baker Hughes IncorporatedBailey Zarahi Fuerst, LCSW [phone]

## 2018-09-04 NOTE — Progress Notes (Signed)
   Subjective: 8 Days Post-Op Procedure(s) (LRB): OPEN REDUCTION INTERNAL FIXATION (ORIF) SUPRACONDYLAR FEMUR FRACTURE ABOVE PROSTHESIS, QUADRICEPS REPAIR (Left) Patient reports pain as mild in the left leg. Patient is well, and has had no acute complaints or problems Denies any CP, SOB, ABD pain. We will continue therapy today, currently recommending SNF.  Objective: Vital signs in last 24 hours: Temp:  [98.3 F (36.8 C)-98.4 F (36.9 C)] 98.4 F (36.9 C) (01/02 2310) Pulse Rate:  [64-71] 64 (01/02 2310) Resp:  [18-20] 20 (01/02 2310) BP: (130-143)/(75-79) 130/75 (01/02 2310) SpO2:  [99 %] 99 % (01/02 2310) Weight:  [173 kg] 173 kg (01/03 0458)  Intake/Output from previous day: 01/02 0701 - 01/03 0700 In: 72.1 [IV Piggyback:72.1] Out: 1475 [Urine:1475] Intake/Output this shift: No intake/output data recorded.  Recent Labs    09/02/18 0254 09/03/18 1020  HGB 8.5* 9.1*   Recent Labs    09/02/18 0254 09/03/18 1020  WBC 9.1 7.7  RBC 2.69* 2.92*  HCT 27.0* 29.0*  PLT 209 218   Recent Labs    09/03/18 1020 09/04/18 0446  NA 141 141  K 3.6 3.7  CL 104 104  CO2 29 30  BUN 40* 36*  CREATININE 1.36* 1.28*  GLUCOSE 145* 105*  CALCIUM 8.1* 8.1*   No results for input(s): LABPT, INR in the last 72 hours.  EXAM General - Patient is Appropriate and Oriented Extremity - Neurovascular intact Sensation intact distally Intact pulses distally Dorsiflexion/Plantar flexion intact No cellulitis present Compartment soft Dressing - dressing C/D/I and no drainage, wound vac intact with minimal bloody output Honeycomb dressings intact wit serosanguinous drainage. Motor Function - intact, moving foot and toes well on exam.   Past Medical History:  Diagnosis Date  . CAD (coronary artery disease)   . Cervicalgia   . CHF (congestive heart failure) (HCC)   . COPD (chronic obstructive pulmonary disease) (HCC)   . Diastolic heart failure (HCC)   . Foot drop, right   .  History of kidney stones   . Hyperlipidemia    unspecified  . Hypertension   . Myocardial infarction (HCC)   . Osteoarthritis   . Shoulder pain, left   . Sleep apnea   . 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   Weight as of this encounter: 173 kg. Advance diet Up with therapy, 20% weightbearing left leg, must wear knee immobilizer locked in extension.  No knee range of motion at this time.  Hg 9.1. Labs pending this am Up with therapy today. Continue with abx per IM/ID for UTI/PNA Discharge to SNF pending clearance from medicine  DVT Prophylaxis - TED hose and SCDs, plavix, Aspirin  T. Cranston Neighborhris Gaines, PA-C [facility] 09/04/2018, 8:05 AM

## 2018-09-04 NOTE — Progress Notes (Signed)
Report called to [facility], LPN at [facility]. Patient to be transferred to room 204 via EMS.

## 2018-09-07 ENCOUNTER — Encounter: Payer: Self-pay | Admitting: Adult Health

## 2018-09-07 ENCOUNTER — Non-Acute Institutional Stay (SKILLED_NURSING_FACILITY): Payer: Medicare Other | Admitting: Adult Health

## 2018-09-07 DIAGNOSIS — N183 Chronic kidney disease, stage 3 unspecified: Secondary | ICD-10-CM

## 2018-09-07 DIAGNOSIS — J449 Chronic obstructive pulmonary disease, unspecified: Secondary | ICD-10-CM | POA: Diagnosis not present

## 2018-09-07 DIAGNOSIS — I5033 Acute on chronic diastolic (congestive) heart failure: Secondary | ICD-10-CM | POA: Diagnosis not present

## 2018-09-07 DIAGNOSIS — E7849 Other hyperlipidemia: Secondary | ICD-10-CM

## 2018-09-07 DIAGNOSIS — D631 Anemia in chronic kidney disease: Secondary | ICD-10-CM

## 2018-09-07 DIAGNOSIS — I25118 Atherosclerotic heart disease of native coronary artery with other forms of angina pectoris: Secondary | ICD-10-CM | POA: Diagnosis not present

## 2018-09-07 DIAGNOSIS — G609 Hereditary and idiopathic neuropathy, unspecified: Secondary | ICD-10-CM

## 2018-09-07 DIAGNOSIS — M9712XS Periprosthetic fracture around internal prosthetic left knee joint, sequela: Secondary | ICD-10-CM

## 2018-09-07 DIAGNOSIS — N401 Enlarged prostate with lower urinary tract symptoms: Secondary | ICD-10-CM

## 2018-09-07 DIAGNOSIS — K5909 Other constipation: Secondary | ICD-10-CM

## 2018-09-07 DIAGNOSIS — G250 Essential tremor: Secondary | ICD-10-CM

## 2018-09-07 DIAGNOSIS — I130 Hypertensive heart and chronic kidney disease with heart failure and stage 1 through stage 4 chronic kidney disease, or unspecified chronic kidney disease: Secondary | ICD-10-CM

## 2018-09-07 DIAGNOSIS — N138 Other obstructive and reflux uropathy: Secondary | ICD-10-CM

## 2018-09-07 DIAGNOSIS — G4733 Obstructive sleep apnea (adult) (pediatric): Secondary | ICD-10-CM

## 2018-09-07 NOTE — Progress Notes (Signed)
Location:   [facility] Room Number: 204 A Place of Service:  SNF (31)   CODE STATUS: Full Code  No Known Allergies  Chief Complaint  Patient presents with  . Hospitalization Follow-up    Hospital Follow up     HPI:  He is a 83 year old man who has been hospitalized after a fall at home. He suffered a left femur fracture and underwent an ORIF. He did have pneumonia post op and was treated for an uti with ESBL. He did require a blood transfusion due to worsening anemia. He is here for short term rehab with his goal to return back home. He is not complaining of uncontrolled pain; he denies any changes in appetite; no anxiety or depressive thoughts. He will continue to be followed for his chronic illnesses including: copd; severe obesity; ckd stage III.     Past Medical History:  Diagnosis Date  . CAD (coronary artery disease)   . Cervicalgia   . CHF (congestive heart failure) (HCC)   . COPD (chronic obstructive pulmonary disease) (HCC)   . Diastolic heart failure (HCC)   . Foot drop, right   . History of kidney stones   . Hyperlipidemia    unspecified  . Hypertension   . Myocardial infarction (HCC)   . Osteoarthritis   . Shoulder pain, left   . Sleep apnea   . Tremor, essential     Past Surgical History:  Procedure Laterality Date  . APPLICATION OF WOUND VAC Right 11/13/2017   Procedure: APPLICATION OF WOUND VAC;  Surgeon: Annice Needy, MD;  Location: [facility] ORS;  Service: Vascular;  Laterality: Right;  . BACK SURGERY  06/2010  . CARDIAC CATHETERIZATION Left 04/30/2016   Procedure: Left Heart Cath and Coronary Angiography;  Surgeon: Laurier Nancy, MD;  Location: [facility] INVASIVE CV LAB;  Service: Cardiovascular;  Laterality: Left;  . COLONOSCOPY    . CORONARY ANGIOPLASTY    . CORONARY/GRAFT ACUTE MI REVASCULARIZATION N/A 06/21/2018   Procedure: Coronary/Graft Acute MI Revascularization;  Surgeon: Iran Ouch, MD;  Location: [facility] INVASIVE CV  LAB;  Service: Cardiovascular;  Laterality: N/A;  . KNEE ARTHROPLASTY Left 06/01/2018   Procedure: COMPUTER ASSISTED TOTAL KNEE ARTHROPLASTY;  Surgeon: Donato Heinz, MD;  Location: [facility] ORS;  Service: Orthopedics;  Laterality: Left;  . KNEE ARTHROSCOPY Left   . KNEE SURGERY Left 1998  . LEFT HEART CATH AND CORONARY ANGIOGRAPHY N/A 06/21/2018   Procedure: LEFT HEART CATH AND CORONARY ANGIOGRAPHY;  Surgeon: Iran Ouch, MD;  Location: [facility] INVASIVE CV LAB;  Service: Cardiovascular;  Laterality: N/A;  . ORIF FEMUR FRACTURE Left 08/27/2018   Procedure: OPEN REDUCTION INTERNAL FIXATION (ORIF) SUPRACONDYLAR FEMUR FRACTURE ABOVE PROSTHESIS, QUADRICEPS REPAIR;  Surgeon: Kennedy Bucker, MD;  Location: [facility] ORS;  Service: Orthopedics;  Laterality: Left;  . TONSILLECTOMY    . WOUND DEBRIDEMENT Right 11/13/2017   Procedure: DEBRIDEMENT WOUND;  Surgeon: Annice Needy, MD;  Location: [facility] ORS;  Service: Vascular;  Laterality: Right;    Social History   Socioeconomic History  . Marital status: Married    Spouse name: [name]  . Number of children: 2  . Years of education: 52  . Highest education level: 11th grade  Occupational History  . Occupation: retired  Engineer, production  . Financial resource strain: Not on file  . Food insecurity:    Worry: Not on file    Inability: Not on file  . Transportation needs:  Medical: Not on file    Non-medical: Not on file  Tobacco Use  . Smoking status: Former Smoker    Packs/day: 0.25    Years: 58.00    Pack years: 14.50    Types: Cigarettes  . Smokeless tobacco: Former Neurosurgeon  . Tobacco comment: quit the begginning of septembet 2019  Substance and Sexual Activity  . Alcohol use: No    Alcohol/week: 0.0 standard drinks  . Drug use: No  . Sexual activity: Not on file  Lifestyle  . Physical activity:    Days per week: Not on file    Minutes per session: Not on file  . Stress: Not on file  Relationships  . Social connections:    Talks on phone:  Not on file    Gets together: Not on file    Attends religious service: Not on file    Active member of club or organization: Not on file    Attends meetings of clubs or organizations: Not on file    Relationship status: Not on file  . Intimate partner violence:    Fear of current or ex partner: Not on file    Emotionally abused: Not on file    Physically abused: Not on file    Forced sexual activity: Not on file  Other Topics Concern  . Not on file  Social History Narrative   Full Code with HCPOA and living will   Married with 2 children   Former smoker   Alcohol - none   Smokeless tobacco - none   Family History  Problem Relation Age of Onset  . Diabetes Son   . Cancer Mother   . Colon cancer Mother   . Lung cancer Mother   . Tremor Father   . Heart disease Father   . Tremor Brother   . Bladder Cancer Brother   . Tremor Sister       VITAL SIGNS BP (!) 144/67   Pulse 66   Temp 98.6 F (37 C)   Resp 18   Ht 5\' 10"  (1.778 m)   Wt 298 lb 6.4 oz (135.4 kg)   SpO2 99%   BMI 42.82 kg/m   Outpatient Encounter Medications as of 09/07/2018  Medication Sig  . acetaminophen (TYLENOL) 500 MG tablet Take 1,000 mg by mouth 2 (two) times daily as needed for moderate pain.  Marland Kitchen alum & mag hydroxide-simeth (MAALOX PLUS) 400-400-40 MG/5ML suspension Take 30 mLs by mouth every 4 (four) hours as needed for indigestion.  . Ascorbic Acid (VITAMIN C PO) Take 1 tablet by mouth daily.  Marland Kitchen aspirin EC 81 MG tablet Take 81 mg by mouth daily.  Marland Kitchen atorvastatin (LIPITOR) 80 MG tablet Take 80 mg by mouth daily.  . carvedilol (COREG) 6.25 MG tablet Take 6.25 mg by mouth 2 (two) times daily with a meal.  . Cholecalciferol 100 MCG (4000 UT) TABS Take 1 tablet by mouth daily.  . clopidogrel (PLAVIX) 75 MG tablet Take 75 mg by mouth daily.  Marland Kitchen dextromethorphan-guaiFENesin (MUCINEX DM) 30-600 MG 12hr tablet Take 1 tablet by mouth 2 (two) times daily as needed for cough.  . gabapentin (NEURONTIN) 400 MG  capsule Take 400 mg by mouth at bedtime.  Marland Kitchen HYDROcodone-acetaminophen (NORCO/VICODIN) 5-325 MG tablet Take 1 tablet by mouth every 4 (four) hours as needed for moderate pain.  Marland Kitchen ipratropium-albuterol (DUONEB) 0.5-2.5 (3) MG/3ML SOLN Take 3 mLs by nebulization every 6 (six) hours as needed (shortness of breath).   . isosorbide  mononitrate (IMDUR) 30 MG 24 hr tablet Take 30 mg by mouth daily.   Marland Kitchen. ketotifen (ZADITOR) 0.025 % ophthalmic solution Place 1 drop into both eyes 2 (two) times daily.  Marland Kitchen. loratadine (CLARITIN) 10 MG tablet Take 10 mg by mouth daily as needed for allergies.  . Multiple Vitamins-Minerals (CENTROVITE) TABS Take 1 tablet by mouth daily.  . nitroGLYCERIN (NITROSTAT) 0.3 MG SL tablet Place 0.3 mg under the tongue every 5 (five) minutes as needed for chest pain.  . NON FORMULARY Diet type:  NAS  . OXYGEN Inhale 2 L/min into the lungs continuous.  . polyethylene glycol (MIRALAX / GLYCOLAX) packet Take 17 g by mouth daily.  . potassium chloride (K-DUR,KLOR-CON) 10 MEQ tablet Take 10 mEq by mouth daily.   . primidone (MYSOLINE) 50 MG tablet Take 200 mg by mouth 2 (two) times daily.   . sacubitril-valsartan (ENTRESTO) 24-26 MG Take 1 tablet by mouth 2 (two) times daily.  . sennosides-docusate sodium (SENOKOT-S) 8.6-50 MG tablet Take 2 tablets by mouth 2 (two) times daily.  Marland Kitchen. spironolactone (ALDACTONE) 25 MG tablet Take 12.5 mg by mouth daily.  . tamsulosin (FLOMAX) 0.4 MG CAPS capsule Take 1 capsule (0.4 mg total) by mouth daily.  Marland Kitchen. torsemide (DEMADEX) 20 MG tablet Take 20 mg by mouth daily.   Marland Kitchen. umeclidinium-vilanterol (ANORO ELLIPTA) 62.5-25 MCG/INH AEPB Inhale 1 puff into the lungs daily.   No facility-administered encounter medications on file as of 09/07/2018.      SIGNIFICANT DIAGNOSTIC EXAMS  TODAY:   08-26-18: ct of head: No acute finding by CT. Atrophy and extensive chronic small-vessel ischemic changes. 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  08-26-18: lumbar spine x-ray:  Single AP view of the lumbar spine shows normal spinal alignment.  08-26-18: left femur fracture: Comminuted displaced fracture of the distal femoral metaphysis above the femoral arthroplasty component.  08-26-18: left knee x-ray: Comminuted fracture distal femoral metaphysis above the femoral arthroplasty component  08-26-18: chest x-ray;  1. Retrocardiac airspace opacity with obscuration of the left costophrenic angle, not entirely specific but possibly from layering pleural effusion of passive atelectasis. 2.  Aortic Atherosclerosis  3. Mild enlargement of the cardiopericardial silhouette. No appreciable edema.  09-01-18: chest x-ray:  1. Slightly worsened bibasilar airspace opacities favored to represent a combination of layering pleural effusions with atelectasis and/or infiltrate. 2. Pulmonary vascular congestion bordering on mild edema.  09-01-18: renal ultrasound: No hydronephrosis is identified bilaterally.  Right kidney cysts.  LABS REVIEWED TODAY:   08-26-18: wbc 9.7; hgb 10.2; hct 32.5; mcv 99.7; plt 136; glucose 136; bun 38; creat 1.95; k+ 3.9; na++ 139; ca 8.2 liver normal albumin 3.0; urine culture no growth;  08-28-28: wbc 7.3; hgb 7.2; hct 23.0; mcv 100;4; plt 114 glucose 132; bun 42; creat 1.93; k+ 4.0; na++ 139; ca 7.6 08-30-18: wbc 8.4; hgb 8.8; hct 28.0; mcv 97.2 plt 150; glucose 134; bun 37; creat 1.42; k+ 4.2; na++ 139; ca 8.2  09-02-18" wbc 9.1; hgb 8.5; hct 27.0; mcv 100.4; plt 209; glucose 128; bun 41; creat 1.47; k+ 3.3; na++ 140; ca 8.1 09-04-18: wbc 7.7; hgb 9.1; hct 290.; mcv 99.3; plt 218; glucose 105; bun 36; creat 1.28; k+ 3.7; na++ 141; ca 8.1   Review of Systems  Constitutional: Negative for malaise/fatigue.  Respiratory: Negative for cough and shortness of breath.   Cardiovascular: Negative for chest pain, palpitations and leg swelling.  Gastrointestinal: Negative for abdominal pain, constipation and heartburn.  Musculoskeletal: Negative for back  pain, joint pain  and myalgias.  Skin: Negative.   Neurological: Negative for dizziness.  Psychiatric/Behavioral: The patient is not nervous/anxious.     Physical Exam Constitutional:      General: He is not in acute distress.    Appearance: He is well-developed. He is obese. He is not diaphoretic.     Comments: Severe obesity   Neck:     Musculoskeletal: Neck supple.     Thyroid: No thyromegaly.  Cardiovascular:     Rate and Rhythm: Normal rate and regular rhythm.     Pulses: Normal pulses.     Heart sounds: Murmur present.     Comments: 1/6 Pulmonary:     Effort: Pulmonary effort is normal. No respiratory distress.     Breath sounds: Normal breath sounds.     Comments: 02 dependent at 2L/min  Abdominal:     General: Bowel sounds are normal. There is no distension.     Palpations: Abdomen is soft.     Tenderness: There is no abdominal tenderness.  Musculoskeletal:     Right lower leg: No edema.     Left lower leg: No edema.     Comments: Has left lower extremity immobilizer in place Is able to move other extremities  History of left knee replacement   Lymphadenopathy:     Cervical: No cervical adenopathy.  Skin:    General: Skin is warm and dry.     Comments: Bilateral lower extremities discolored Left incision line without signs of infection; wound vac in place   Neurological:     Mental Status: He is alert and oriented to person, place, and time.  Psychiatric:        Mood and Affect: Mood normal.       ASSESSMENT/ PLAN:  TODAY:   1. Hypertensive heart and kidney disease with acute on chronic diastolic congestive heart failure and stage 3 chronic kidney disease: is stable: b/p 144/67 will continue coreg 6.25 mg twice daily aldactone 12.5 mg daily and  asa 81 mg daily   2.  Acute on chronic diastolic heart failure: EF 30-35% (06-30-18): is stable will continue coreg 6.25 mg twice daily; entresto 24/26 mg twice daily aldactone 12.5 mg daily and demadex 20 mg daily  with k+ 10 meq daily   3. Coronary atherosclerosis of native coronary artery of native heart with other forms of angina pectoris status post STEMI and PCI: is stable; will continue coreg 6.25 mg twice daily asa 81 mg daily plavix 75 mg daily imdur 30 mg daily has prn ntg  4. COPD, severe: is stable is 02 dependent: will continue mucinex dm twice daily as needed; claritin 10 mg daily as needed; duoneb every 6 hours as needed; and anora ellipta 62.5-25 mg 1 puff daily   5. Chronic constipation: is stable will continue senna s 2 tabs twice daily miralax daily   6. Benign essential tremor: is stable will continue primidone 200 mg twice daily   7. Periprosthetic fracture around internal prosthetic left knee joint: is stable will continue therapy as directed and will follow up with orthopedics; will continue vicodin 5/325 mg every 4 hours as needed for pain  8. Hyperlipidemia: is stable will continue lipitor 80 mg daily   9. BPH with obstruction/lower urinary tract symptoms: is stable will continue flomax 0.4 mg daily   10. Obstructive sleep apnea: is stable will continue CPAP  11. Idiopathic peripheral neuropathy: is stable will continue neurontin 400 mg daily   12. Chronic kidney disease stage 3  mod decreased GFR: is stable bun 36; creat 1.28  13. Anemia of chronic renal disease stage 3: is stable hgb 9.1 received blood transfusion in hospital.       MD is aware of resident's narcotic use and is in agreement with current plan of care. We will attempt to wean resident as apropriate   Synthia Innocenteborah Shawnell Dykes NP [facility]  Contact [phone] Monday through Friday 8am- 5pm  After hours call [phone]

## 2018-09-09 DIAGNOSIS — I130 Hypertensive heart and chronic kidney disease with heart failure and stage 1 through stage 4 chronic kidney disease, or unspecified chronic kidney disease: Secondary | ICD-10-CM | POA: Insufficient documentation

## 2018-09-09 DIAGNOSIS — D631 Anemia in chronic kidney disease: Secondary | ICD-10-CM | POA: Insufficient documentation

## 2018-09-09 DIAGNOSIS — N138 Other obstructive and reflux uropathy: Secondary | ICD-10-CM | POA: Insufficient documentation

## 2018-09-09 DIAGNOSIS — N183 Chronic kidney disease, stage 3 unspecified: Secondary | ICD-10-CM | POA: Insufficient documentation

## 2018-09-09 DIAGNOSIS — N401 Enlarged prostate with lower urinary tract symptoms: Secondary | ICD-10-CM

## 2018-09-09 DIAGNOSIS — G4733 Obstructive sleep apnea (adult) (pediatric): Secondary | ICD-10-CM | POA: Insufficient documentation

## 2018-09-09 DIAGNOSIS — I5033 Acute on chronic diastolic (congestive) heart failure: Secondary | ICD-10-CM

## 2018-09-09 DIAGNOSIS — G609 Hereditary and idiopathic neuropathy, unspecified: Secondary | ICD-10-CM | POA: Insufficient documentation

## 2018-09-09 DIAGNOSIS — K5909 Other constipation: Secondary | ICD-10-CM | POA: Insufficient documentation

## 2018-09-10 ENCOUNTER — Encounter: Payer: Self-pay | Admitting: Adult Health

## 2018-09-10 ENCOUNTER — Non-Acute Institutional Stay (SKILLED_NURSING_FACILITY): Payer: Medicare Other | Admitting: Adult Health

## 2018-09-10 DIAGNOSIS — J449 Chronic obstructive pulmonary disease, unspecified: Secondary | ICD-10-CM | POA: Diagnosis not present

## 2018-09-10 DIAGNOSIS — N183 Chronic kidney disease, stage 3 (moderate): Secondary | ICD-10-CM

## 2018-09-10 DIAGNOSIS — M9712XS Periprosthetic fracture around internal prosthetic left knee joint, sequela: Secondary | ICD-10-CM | POA: Diagnosis not present

## 2018-09-10 DIAGNOSIS — I5033 Acute on chronic diastolic (congestive) heart failure: Secondary | ICD-10-CM

## 2018-09-10 DIAGNOSIS — I130 Hypertensive heart and chronic kidney disease with heart failure and stage 1 through stage 4 chronic kidney disease, or unspecified chronic kidney disease: Secondary | ICD-10-CM

## 2018-09-10 NOTE — Progress Notes (Signed)
Location:   [facility] Room Number: 204 A Place of Service:  SNF (31)   CODE STATUS: Full Code  No Known Allergies  Chief Complaint  Patient presents with  . Acute Visit    Care Plan Meeting    HPI:  We have come together for his routine care plan meeting. He does have family present with him. He is currently 100 % dependent upon care givers for his adls. He does have limited ROM in his upper extremities and is non weight bearing. He has poor tolerance to activities with DOE. He was not using 02 prior to his hospitalization. Therapy will attempt to wean off 02. He does not have any uncontrolled pain. He does have insomnia.    Past Medical History:  Diagnosis Date  . CAD (coronary artery disease)   . Cervicalgia   . CHF (congestive heart failure) (HCC)   . COPD (chronic obstructive pulmonary disease) (HCC)   . Diastolic heart failure (HCC)   . Foot drop, right   . History of kidney stones   . Hyperlipidemia    unspecified  . Hypertension   . Myocardial infarction (HCC)   . Osteoarthritis   . Shoulder pain, left   . Sleep apnea   . Tremor, essential     Past Surgical History:  Procedure Laterality Date  . APPLICATION OF WOUND VAC Right 11/13/2017   Procedure: APPLICATION OF WOUND VAC;  Surgeon: Annice Needy, MD;  Location: [facility] ORS;  Service: Vascular;  Laterality: Right;  . BACK SURGERY  06/2010  . CARDIAC CATHETERIZATION Left 04/30/2016   Procedure: Left Heart Cath and Coronary Angiography;  Surgeon: Laurier Nancy, MD;  Location: [facility] INVASIVE CV LAB;  Service: Cardiovascular;  Laterality: Left;  . COLONOSCOPY    . CORONARY ANGIOPLASTY    . CORONARY/GRAFT ACUTE MI REVASCULARIZATION N/A 06/21/2018   Procedure: Coronary/Graft Acute MI Revascularization;  Surgeon: Iran Ouch, MD;  Location: [facility] INV[facility];  Service: Cardiovascular;  Laterality: N/A;  . KNEE ARTHROPLASTY Left 06/01/2018   Procedure: COMPUTER ASSISTED TOTAL KNEE  ARTHROPLASTY;  Surgeon: Donato Heinz, MD;  Location: [facility] ORS;  Service: Orthopedics;  Laterality: Left;  . KNEE ARTHROSCOPY Left   . KNEE SURGERY Left 1998  . LEFT HEART CATH AND CORONARY ANGIOGRAPHY N/A 06/21/2018   Procedure: LEFT HEART CATH AND CORONARY ANGIOGRAPHY;  Surgeon: Iran Ouch, MD;  Location: [facility] INV[facility] CV [facility];  Service: Cardiovascular;  Laterality: N/A;  . ORIF FEMUR FRACTURE Left 08/27/2018   Procedure: OPEN REDUCTION INTERNAL FIXATION (ORIF) SUPRACONDYLAR FEMUR FRACTURE ABOVE PROSTHESIS, QUADRICEPS REPAIR;  Surgeon: Kennedy Bucker, MD;  Location: [facility] ORS;  Service: Orthopedics;  Laterality: Left;  . TONSILLECTOMY    . WOUND DEBRIDEMENT Right 11/13/2017   Procedure: DEBRIDEMENT WOUND;  Surgeon: Annice Needy, MD;  Location: [facility] ORS;  Service: Vascular;  Laterality: Right;    Social History   Socioeconomic History  . Marital status: Married    Spouse name: [name]  . Number of children: 2  . Years of education: 77  . Highest education level: 11th grade  Occupational History  . Occupation: retired  Engineer, production  . Financial resource strain: Not on file  . Food insecurity:    Worry: Not on file    Inability: Not on file  . Transportation needs:    Medical: Not on file    Non-medical: Not on file  Tobacco Use  . Smoking status: Former Smoker  Packs/day: 0.25    Years: 58.00    Pack years: 14.50    Types: Cigarettes  . Smokeless tobacco: Former NeurosurgeonUser  . Tobacco comment: quit the begginning of septembet 2019  Substance and Sexual Activity  . Alcohol use: No    Alcohol/week: 0.0 standard drinks  . Drug use: No  . Sexual activity: Not on file  Lifestyle  . Physical activity:    Days per week: Not on file    Minutes per session: Not on file  . Stress: Not on file  Relationships  . Social connections:    Talks on phone: Not on file    Gets together: Not on file    Attends religious service: Not on file    Active member of club or organization:  Not on file    Attends meetings of clubs or organizations: Not on file    Relationship status: Not on file  . Intimate partner violence:    Fear of current or ex partner: Not on file    Emotionally abused: Not on file    Physically abused: Not on file    Forced sexual activity: Not on file  Other Topics Concern  . Not on file  Social History Narrative   Full Code with HCPOA and living will   Married with 2 children   Former smoker   Alcohol - none   Smokeless tobacco - none   Family History  Problem Relation Age of Onset  . Diabetes Son   . Cancer Mother   . Colon cancer Mother   . Lung cancer Mother   . Tremor Father   . Heart disease Father   . Tremor Brother   . Bladder Cancer Brother   . Tremor Sister       VITAL SIGNS BP 124/64   Pulse (!) 59   Temp 98.9 F (37.2 C)   Resp 20   Ht 5\' 10"  (1.778 m)   SpO2 100%   BMI 54.67 kg/m   Outpatient Encounter Medications as of 09/10/2018  Medication Sig  . acetaminophen (TYLENOL) 500 MG tablet Take 1,000 mg by mouth 2 (two) times daily as needed for moderate pain.  Marland Kitchen. alum & mag hydroxide-simeth (MAALOX PLUS) 400-400-40 MG/5ML suspension Take 30 mLs by mouth every 4 (four) hours as needed for indigestion.  . Ascorbic Acid (VITAMIN C PO) Take 1 tablet by mouth daily.  Marland Kitchen. aspirin EC 81 MG tablet Take 81 mg by mouth daily.  Marland Kitchen. atorvastatin (LIPITOR) 80 MG tablet Take 80 mg by mouth daily.  . carvedilol (COREG) 6.25 MG tablet Take 6.25 mg by mouth 2 (two) times daily with a meal.  . Cholecalciferol 100 MCG (4000 UT) TABS Take 1 tablet by mouth daily.  . clopidogrel (PLAVIX) 75 MG tablet Take 75 mg by mouth daily.  . collagenase (SANTYL) ointment Cleanse right first toe with normal saline,  Apply santyl nickel thick (1/4 inch)  to wound bed, cover with normal saline moist gauze and cover with Kerlix  . dextromethorphan-guaiFENesin (MUCINEX DM) 30-600 MG 12hr tablet Take 1 tablet by mouth 2 (two) times daily as needed for cough.    . gabapentin (NEURONTIN) 400 MG capsule Take 400 mg by mouth at bedtime.  Marland Kitchen. HYDROcodone-acetaminophen (NORCO/VICODIN) 5-325 MG tablet Take 1 tablet by mouth every 4 (four) hours as needed for moderate pain.  Marland Kitchen. ipratropium-albuterol (DUONEB) 0.5-2.5 (3) MG/3ML SOLN Take 3 mLs by nebulization every 6 (six) hours as needed (shortness of breath).   .Marland Kitchen  isosorbide mononitrate (IMDUR) 30 MG 24 hr tablet Take 30 mg by mouth daily.   Marland Kitchen ketotifen (ZADITOR) 0.025 % ophthalmic solution Place 1 drop into both eyes 2 (two) times daily.  Marland Kitchen loratadine (CLARITIN) 10 MG tablet Take 10 mg by mouth daily as needed for allergies.  . Multiple Vitamins-Minerals (CENTROVITE) TABS Take 1 tablet by mouth daily.  . nitroGLYCERIN (NITROSTAT) 0.3 MG SL tablet Place 0.3 mg under the tongue every 5 (five) minutes as needed for chest pain.  . NON FORMULARY Diet type:  NAS  . OXYGEN Inhale 2 L/min into the lungs continuous.  . polyethylene glycol (MIRALAX / GLYCOLAX) packet Take 17 g by mouth daily.  . potassium chloride (K-DUR,KLOR-CON) 10 MEQ tablet Take 10 mEq by mouth daily.   . primidone (MYSOLINE) 50 MG tablet Take 200 mg by mouth 2 (two) times daily.   . sacubitril-valsartan (ENTRESTO) 24-26 MG Take 1 tablet by mouth 2 (two) times daily.  . sennosides-docusate sodium (SENOKOT-S) 8.6-50 MG tablet Take 2 tablets by mouth 2 (two) times daily.  Marland Kitchen spironolactone (ALDACTONE) 25 MG tablet Take 12.5 mg by mouth daily.  . tamsulosin (FLOMAX) 0.4 MG CAPS capsule Take 1 capsule (0.4 mg total) by mouth daily.  Marland Kitchen torsemide (DEMADEX) 20 MG tablet Take 20 mg by mouth daily.   Marland Kitchen umeclidinium-vilanterol (ANORO ELLIPTA) 62.5-25 MCG/INH AEPB Inhale 1 puff into the lungs daily.   No facility-administered encounter medications on file as of 09/10/2018.      SIGNIFICANT DIAGNOSTIC EXAMS  PREVIOUS:   08-26-18: ct of head: No acute finding by CT. Atrophy and extensive chronic small-vessel ischemic changes. 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  08-26-18: lumbar spine x-ray: Single AP view of the lumbar spine shows normal spinal alignment.  08-26-18: left femur fracture: Comminuted displaced fracture of the distal femoral metaphysis above the femoral arthroplasty component.  08-26-18: left knee x-ray: Comminuted fracture distal femoral metaphysis above the femoral arthroplasty component  08-26-18: chest x-ray;  1. Retrocardiac airspace opacity with obscuration of the left costophrenic angle, not entirely specific but possibly from layering pleural effusion of passive atelectasis. 2.  Aortic Atherosclerosis  3. Mild enlargement of the cardiopericardial silhouette. No appreciable edema.  09-01-18: chest x-ray:  1. Slightly worsened bibasilar airspace opacities favored to represent a combination of layering pleural effusions with atelectasis and/or infiltrate. 2. Pulmonary vascular congestion bordering on mild edema.  09-01-18: renal ultrasound: No hydronephrosis is identified bilaterally. Right kidney cysts.  NO NEW EXAMS.   LABS REVIEWED PREVIOUS:   08-26-18: wbc 9.7; hgb 10.2; hct 32.5; mcv 99.7; plt 136; glucose 136; bun 38; creat 1.95; k+ 3.9; na++ 139; ca 8.2 liver normal albumin 3.0; urine culture no growth;  08-28-28: wbc 7.3; hgb 7.2; hct 23.0; mcv 100;4; plt 114 glucose 132; bun 42; creat 1.93; k+ 4.0; na++ 139; ca 7.6 08-30-18: wbc 8.4; hgb 8.8; hct 28.0; mcv 97.2 plt 150; glucose 134; bun 37; creat 1.42; k+ 4.2; na++ 139; ca 8.2  09-02-18" wbc 9.1; hgb 8.5; hct 27.0; mcv 100.4; plt 209; glucose 128; bun 41; creat 1.47; k+ 3.3; na++ 140; ca 8.1 09-04-18: wbc 7.7; hgb 9.1; hct 290.; mcv 99.3; plt 218; glucose 105; bun 36; creat 1.28; k+ 3.7; na++ 141; ca 8.  NO NEW LABS.    Review of Systems  Constitutional: Negative for malaise/fatigue.  Respiratory: Negative for cough and shortness of breath.   Cardiovascular: Negative for chest pain, palpitations and leg swelling.  Gastrointestinal: Negative for  abdominal pain, constipation  and heartburn.  Musculoskeletal: Negative for back pain, joint pain and myalgias.  Skin: Negative.   Neurological: Negative for dizziness.  Psychiatric/Behavioral: The patient has insomnia. The patient is not nervous/anxious.     Physical Exam Constitutional:      General: He is not in acute distress.    Appearance: He is well-developed. He is obese. He is not diaphoretic.     Comments: Severe obesity   Neck:     Thyroid: No thyromegaly.  Cardiovascular:     Rate and Rhythm: Normal rate and regular rhythm.     Pulses: Normal pulses.     Heart sounds: Murmur present.     Comments: 1/6 Pulmonary:     Effort: Pulmonary effort is normal. No respiratory distress.     Breath sounds: Normal breath sounds.  Abdominal:     General: Bowel sounds are normal. There is no distension.     Palpations: Abdomen is soft.     Tenderness: There is no abdominal tenderness.  Musculoskeletal:     Right lower leg: No edema.     Left lower leg: No edema.     Comments: Has left lower extremity immobilizer in place Is able to move other extremities  History of left knee replacement   Has limited range of motion in upper extremities   Lymphadenopathy:     Cervical: No cervical adenopathy.  Skin:    General: Skin is warm and dry.     Comments: Bilateral lower extremities discolored Left incision line without signs of infection; wound vac in place    Neurological:     Mental Status: He is alert and oriented to person, place, and time.  Psychiatric:        Mood and Affect: Mood normal.       ASSESSMENT/ PLAN:  TODAY:   1. Periprosthetic fracture around internal prosthetic left knee joint: 2. Hypertensive heart and kidney disease with acute on chronic congestive heart failure and stage 3 chronic kidney disease 3. COPD, severe  Will begin melatonin 3 mg nightly  Will have him use a flutter valve 4 times daily  Will continue therapy as directed  The goal of his  care is to return back home.      MD is aware of resident's narcotic use and is in agreement with current plan of care. We will attempt to wean resident as apropriate   Synthia Innocent NP [facility]  Contact [phone] Monday through Friday 8am- 5pm  After hours call [phone]

## 2018-09-14 ENCOUNTER — Other Ambulatory Visit: Payer: Self-pay

## 2018-09-14 ENCOUNTER — Non-Acute Institutional Stay (SKILLED_NURSING_FACILITY): Payer: Medicare Other | Admitting: Adult Health

## 2018-09-14 DIAGNOSIS — I130 Hypertensive heart and chronic kidney disease with heart failure and stage 1 through stage 4 chronic kidney disease, or unspecified chronic kidney disease: Secondary | ICD-10-CM | POA: Diagnosis not present

## 2018-09-14 DIAGNOSIS — N183 Chronic kidney disease, stage 3 (moderate): Secondary | ICD-10-CM

## 2018-09-14 DIAGNOSIS — J449 Chronic obstructive pulmonary disease, unspecified: Secondary | ICD-10-CM

## 2018-09-14 DIAGNOSIS — I25118 Atherosclerotic heart disease of native coronary artery with other forms of angina pectoris: Secondary | ICD-10-CM | POA: Diagnosis not present

## 2018-09-14 DIAGNOSIS — I5033 Acute on chronic diastolic (congestive) heart failure: Secondary | ICD-10-CM

## 2018-09-18 NOTE — Progress Notes (Signed)
Location:   [facility]    Place of Service:   [facility]    CODE STATUS: full code   No Known Allergies  Chief Complaint  Patient presents with  . Medical Management of Chronic Issues    Hypertensive heart and kidney disease with acute on chronic diastolic congestive hart failure and stage 3 chronic kidney disease; atherosclerosis of native coronary artery of native heart with other form of angina pectoris; acute on chronic diastolic heart failure; copd, severe. Weekly follow up for the first 30 days post hospitalization.     HPI:  He is a 83 year old short term rehab patient being seen for the management of his chronic illnesses: hypertensive heart disease; cad; diastolic heart failure; copd. He has been to orthopedic today and his staples and wound vac removed. He denies any uncontrolled hip pain; no changes in appetite; no anxiety.   Past Medical History:  Diagnosis Date  . CAD (coronary artery disease)   . Cervicalgia   . CHF (congestive heart failure) (HCC)   . COPD (chronic obstructive pulmonary disease) (HCC)   . Diastolic heart failure (HCC)   . Foot drop, right   . History of kidney stones   . Hyperlipidemia    unspecified  . Hypertension   . Myocardial infarction (HCC)   . Osteoarthritis   . Shoulder pain, left   . Sleep apnea   . Tremor, essential     Past Surgical History:  Procedure Laterality Date  . APPLICATION OF WOUND VAC Right 11/13/2017   Procedure: APPLICATION OF WOUND VAC;  Surgeon: Annice Needyew, Jason S, MD;  Location: [facility];  Service: Vascular;  Laterality: Right;  . BACK SURGERY  06/2010  . CARDIAC CATHETERIZATION Left 04/30/2016   Procedure: Left Heart Cath and Coronary Angiography;  Surgeon: Laurier NancyShaukat A Khan, MD;  Location: [facility];  Service: Cardiovascular;  Laterality: Left;  . COLONOSCOPY    . CORONARY ANGIOPLASTY    . CORONARY/GRAFT ACUTE MI REVASCULARIZATION N/A 06/21/2018   Procedure: Coronary/Graft Acute MI Revascularization;  Surgeon:  Iran OuchArida, Muhammad A, MD;  Location: [facility];  Service: Cardiovascular;  Laterality: N/A;  . KNEE ARTHROPLASTY Left 06/01/2018   Procedure: COMPUTER ASSISTED TOTAL KNEE ARTHROPLASTY;  Surgeon: Donato HeinzHooten, James P, MD;  Location: [facility];  Service: Orthopedics;  Laterality: Left;  . KNEE ARTHROSCOPY Left   . KNEE SURGERY Left 1998  . LEFT HEART CATH AND CORONARY ANGIOGRAPHY N/A 06/21/2018   Procedure: LEFT HEART CATH AND CORONARY ANGIOGRAPHY;  Surgeon: Iran OuchArida, Muhammad A, MD;  Location: [facility];  Service: Cardiovascular;  Laterality: N/A;  . ORIF FEMUR FRACTURE Left 08/27/2018   Procedure: OPEN REDUCTION INTERNAL FIXATION (ORIF) SUPRACONDYLAR FEMUR FRACTURE ABOVE PROSTHESIS, QUADRICEPS REPAIR;  Surgeon: Kennedy BuckerMenz, Michael, MD;  Location: [facility];  Service: Orthopedics;  Laterality: Left;  . TONSILLECTOMY    . WOUND DEBRIDEMENT Right 11/13/2017   Procedure: DEBRIDEMENT WOUND;  Surgeon: Annice Needyew, Jason S, MD;  Location: [facility];  Service: Vascular;  Laterality: Right;    Social History   Socioeconomic History  . Marital status: Married    Spouse name: Eber JonesCarolyn  . Number of children: 2  . Years of education: 7011  . Highest education level: 11th grade  Occupational History  . Occupation: retired  Engineer, productionocial Needs  . Financial resource strain: Not on file  . Food insecurity:    Worry: Not on file    Inability: Not on file  . Transportation needs:  Medical: Not on file    Non-medical: Not on file  Tobacco Use  . Smoking status: Former Smoker    Packs/day: 0.25    Years: 58.00    Pack years: 14.50    Types: Cigarettes  . Smokeless tobacco: Former Neurosurgeon  . Tobacco comment: quit the begginning of septembet 2019  Substance and Sexual Activity  . Alcohol use: No    Alcohol/week: 0.0 standard drinks  . Drug use: No  . Sexual activity: Not on file  Lifestyle  . Physical activity:    Days per week: Not on file    Minutes per session: Not on file  . Stress: Not on file    Relationships  . Social connections:    Talks on phone: Not on file    Gets together: Not on file    Attends religious service: Not on file    Active member of club or organization: Not on file    Attends meetings of clubs or organizations: Not on file    Relationship status: Not on file  . Intimate partner violence:    Fear of current or ex partner: Not on file    Emotionally abused: Not on file    Physically abused: Not on file    Forced sexual activity: Not on file  Other Topics Concern  . Not on file  Social History Narrative   Full Code with HCPOA and living will   Married with 2 children   Former smoker   Alcohol - none   Smokeless tobacco - none   Family History  Problem Relation Age of Onset  . Diabetes Son   . Cancer Mother   . Colon cancer Mother   . Lung cancer Mother   . Tremor Father   . Heart disease Father   . Tremor Brother   . Bladder Cancer Brother   . Tremor Sister       VITAL SIGNS BP 132/68   Pulse 72   Temp 98.1 F (36.7 C)   Resp 16   Ht 5\' 10"  (1.778 m)   Wt 293 lb (132.9 kg)   SpO2 95%   BMI 42.04 kg/m   Outpatient Encounter Medications as of 09/14/2018  Medication Sig  . acetaminophen (TYLENOL) 500 MG tablet Take 1,000 mg by mouth 2 (two) times daily as needed for moderate pain.  Marland Kitchen alum & mag hydroxide-simeth (MAALOX PLUS) 400-400-40 MG/5ML suspension Take 30 mLs by mouth every 4 (four) hours as needed for indigestion.  . Ascorbic Acid (VITAMIN C PO) Take 1 tablet by mouth daily.  Marland Kitchen aspirin EC 81 MG tablet Take 81 mg by mouth daily.  Marland Kitchen atorvastatin (LIPITOR) 80 MG tablet Take 80 mg by mouth daily.  . carvedilol (COREG) 6.25 MG tablet Take 6.25 mg by mouth 2 (two) times daily with a meal.  . Cholecalciferol 100 MCG (4000 UT) TABS Take 1 tablet by mouth daily.  . clopidogrel (PLAVIX) 75 MG tablet Take 75 mg by mouth daily.  . collagenase (SANTYL) ointment Cleanse right first toe with normal saline,  Apply santyl nickel thick (1/4  inch)  to wound bed, cover with normal saline moist gauze and cover with Kerlix  . dextromethorphan-guaiFENesin (MUCINEX DM) 30-600 MG 12hr tablet Take 1 tablet by mouth 2 (two) times daily as needed for cough.  . gabapentin (NEURONTIN) 400 MG capsule Take 400 mg by mouth at bedtime.  Marland Kitchen HYDROcodone-acetaminophen (NORCO/VICODIN) 5-325 MG tablet Take 1 tablet by mouth every 4 (four) hours  as needed for moderate pain.  Marland Kitchen ipratropium-albuterol (DUONEB) 0.5-2.5 (3) MG/3ML SOLN Take 3 mLs by nebulization every 6 (six) hours as needed (shortness of breath).   . isosorbide mononitrate (IMDUR) 30 MG 24 hr tablet Take 30 mg by mouth daily.   Marland Kitchen ketotifen (ZADITOR) 0.025 % ophthalmic solution Place 1 drop into both eyes 2 (two) times daily.  Marland Kitchen loratadine (CLARITIN) 10 MG tablet Take 10 mg by mouth daily as needed for allergies.  . Multiple Vitamins-Minerals (CENTROVITE) TABS Take 1 tablet by mouth daily.  . nitroGLYCERIN (NITROSTAT) 0.3 MG SL tablet Place 0.3 mg under the tongue every 5 (five) minutes as needed for chest pain.  . NON FORMULARY Diet type:  NAS  . OXYGEN Inhale 2 L/min into the lungs continuous.  . polyethylene glycol (MIRALAX / GLYCOLAX) packet Take 17 g by mouth daily.  . potassium chloride (K-DUR,KLOR-CON) 10 MEQ tablet Take 10 mEq by mouth daily.   . primidone (MYSOLINE) 50 MG tablet Take 200 mg by mouth 2 (two) times daily.   . sacubitril-valsartan (ENTRESTO) 24-26 MG Take 1 tablet by mouth 2 (two) times daily.  . sennosides-docusate sodium (SENOKOT-S) 8.6-50 MG tablet Take 2 tablets by mouth 2 (two) times daily.  Marland Kitchen spironolactone (ALDACTONE) 25 MG tablet Take 12.5 mg by mouth daily.  . tamsulosin (FLOMAX) 0.4 MG CAPS capsule Take 1 capsule (0.4 mg total) by mouth daily.  Marland Kitchen torsemide (DEMADEX) 20 MG tablet Take 20 mg by mouth daily.   Marland Kitchen umeclidinium-vilanterol (ANORO ELLIPTA) 62.5-25 MCG/INH AEPB Inhale 1 puff into the lungs daily.   No facility-administered encounter medications on  file as of 09/14/2018.      SIGNIFICANT DIAGNOSTIC EXAMS   PREVIOUS:   08-26-18: ct of head: No acute finding by CT. Atrophy and extensive chronic small-vessel ischemic changes. 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  08-26-18: lumbar spine x-ray: Single AP view of the lumbar spine shows normal spinal alignment.  08-26-18: left femur fracture: Comminuted displaced fracture of the distal femoral metaphysis above the femoral arthroplasty component.  08-26-18: left knee x-ray: Comminuted fracture distal femoral metaphysis above the femoral arthroplasty component  08-26-18: chest x-ray;  1. Retrocardiac airspace opacity with obscuration of the left costophrenic angle, not entirely specific but possibly from layering pleural effusion of passive atelectasis. 2.  Aortic Atherosclerosis  3. Mild enlargement of the cardiopericardial silhouette. No appreciable edema.  09-01-18: chest x-ray:  1. Slightly worsened bibasilar airspace opacities favored to represent a combination of layering pleural effusions with atelectasis and/or infiltrate. 2. Pulmonary vascular congestion bordering on mild edema.  09-01-18: renal ultrasound: No hydronephrosis is identified bilaterally. Right kidney cysts.  NO NEW EXAMS.   LABS REVIEWED PREVIOUS:   08-26-18: wbc 9.7; hgb 10.2; hct 32.5; mcv 99.7; plt 136; glucose 136; bun 38; creat 1.95; k+ 3.9; na++ 139; ca 8.2 liver normal albumin 3.0; urine culture no growth;  08-28-28: wbc 7.3; hgb 7.2; hct 23.0; mcv 100;4; plt 114 glucose 132; bun 42; creat 1.93; k+ 4.0; na++ 139; ca 7.6 08-30-18: wbc 8.4; hgb 8.8; hct 28.0; mcv 97.2 plt 150; glucose 134; bun 37; creat 1.42; k+ 4.2; na++ 139; ca 8.2  09-02-18" wbc 9.1; hgb 8.5; hct 27.0; mcv 100.4; plt 209; glucose 128; bun 41; creat 1.47; k+ 3.3; na++ 140; ca 8.1 09-04-18: wbc 7.7; hgb 9.1; hct 290.; mcv 99.3; plt 218; glucose 105; bun 36; creat 1.28; k+ 3.7; na++ 141; ca 8.  NO NEW LABS.    Review of Systems  Constitutional: Negative for malaise/fatigue.  Respiratory: Negative for cough and shortness of breath.   Cardiovascular: Negative for chest pain, palpitations and leg swelling.  Gastrointestinal: Positive for constipation. Negative for abdominal pain and heartburn.  Musculoskeletal: Negative for back pain, joint pain and myalgias.  Skin: Negative.   Neurological: Negative for dizziness.  Psychiatric/Behavioral: The patient is not nervous/anxious.     Physical Exam Constitutional:      General: He is not in acute distress.    Appearance: He is well-developed. He is obese. He is not diaphoretic.  Neck:     Musculoskeletal: Neck supple.     Thyroid: No thyromegaly.  Cardiovascular:     Rate and Rhythm: Normal rate and regular rhythm.     Pulses: Normal pulses.     Heart sounds: Murmur present.     Comments: 1/6 Pulmonary:     Effort: Pulmonary effort is normal. No respiratory distress.     Breath sounds: Normal breath sounds.  Abdominal:     General: Bowel sounds are normal. There is no distension.     Palpations: Abdomen is soft.     Tenderness: There is no abdominal tenderness.  Musculoskeletal:     Right lower leg: No edema.     Left lower leg: No edema.     Comments: Has left lower extremity immobilizer in place Is able to move other extremities  History of left knee replacement   Has limited range of motion in upper extremities    Lymphadenopathy:     Cervical: No cervical adenopathy.  Skin:    General: Skin is warm and dry.     Comments: Bilateral lower extremities discolored Left incision line without signs of infection;  Groin yeast present will being nystatin powder   Neurological:     Mental Status: He is alert and oriented to person, place, and time.  Psychiatric:        Mood and Affect: Mood normal.     ASSESSMENT/ PLAN:  TODAY:   1. Hypertensive heart and kidney disease with acute on chronic diastolic congestive heart failure and stage 3 chronic  kidney disease: is stable: b/p 144/67 will continue coreg 6.25 mg twice daily aldactone 12.5 mg daily and  asa 81 mg daily   2.  Acute on chronic diastolic heart failure: EF 30-35% (06-30-18): is stable will continue coreg 6.25 mg twice daily; entresto 24/26 mg twice daily aldactone 12.5 mg daily and demadex 20 mg daily with k+ 10 meq daily   3. Coronary atherosclerosis of native coronary artery of native heart with other forms of angina pectoris status post STEMI and PCI: is stable; will continue coreg 6.25 mg twice daily asa 81 mg daily plavix 75 mg daily imdur 30 mg daily has prn ntg  4. COPD, severe: is stable is 02 dependent: will continue mucinex dm twice daily as needed; claritin 10 mg daily as needed; duoneb every 6 hours as needed; and anora ellipta 62.5-25 mg 1 puff daily   PREVIOUS   5. Chronic constipation: is stable will continue senna s 2 tabs twice will change miralax twice daily   6. Benign essential tremor: is stable will continue primidone 200 mg twice daily   7. Periprosthetic fracture around internal prosthetic left knee joint: is stable will continue therapy as directed and will follow up with orthopedics; will continue vicodin 5/325 mg every 4 hours as needed for pain  8. Hyperlipidemia: is stable will continue lipitor 80 mg daily   9. BPH with  obstruction/lower urinary tract symptoms: is stable will continue flomax 0.4 mg daily   10. Obstructive sleep apnea: is stable will continue CPAP  11. Idiopathic peripheral neuropathy: is stable will continue neurontin 400 mg daily   12. Chronic kidney disease stage 3 mod decreased GFR: is stable bun 36; creat 1.28  13. Anemia of chronic renal disease stage 3: is stable hgb 9.1 received blood transfusion in hospital.       MD is aware of resident's narcotic use and is in agreement with current plan of care. We will attempt to wean resident as apropriate   Synthia Innocent NP [facility]  Contact [phone]  Monday through Friday 8am- 5pm  After hours call [phone]

## 2018-09-19 ENCOUNTER — Other Ambulatory Visit
Admission: RE | Admit: 2018-09-19 | Discharge: 2018-09-19 | Disposition: A | Discharge status: Home / Self Care | Payer: Medicare Other | Source: Ambulatory Visit | Attending: Internal Medicine | Admitting: Internal Medicine

## 2018-09-19 DIAGNOSIS — I10 Essential (primary) hypertension: Secondary | ICD-10-CM | POA: Diagnosis present

## 2018-09-19 LAB — BASIC METABOLIC PANEL
Anion gap: 8 (ref 5–15)
BUN: 33 mg/dL — ABNORMAL HIGH (ref 8–23)
CO2: 30 mmol/L (ref 22–32)
Calcium: 8.2 mg/dL — ABNORMAL LOW (ref 8.9–10.3)
Chloride: 102 mmol/L (ref 98–111)
Creatinine, Ser: 1.18 mg/dL (ref 0.61–1.24)
GFR calc Af Amer: >60 mL/min (ref >60)
GFR calc non Af Amer: 58 mL/min — ABNORMAL LOW (ref >60)
Glucose, Bld: 98 mg/dL (ref 70–99)
Potassium: 3.5 mmol/L (ref 3.5–5.1)
SODIUM: 140 mmol/L (ref 135–145)

## 2018-09-21 ENCOUNTER — Encounter: Payer: Self-pay | Admitting: Adult Health

## 2018-09-21 ENCOUNTER — Non-Acute Institutional Stay (SKILLED_NURSING_FACILITY): Payer: Medicare Other | Admitting: Adult Health

## 2018-09-21 DIAGNOSIS — I5033 Acute on chronic diastolic (congestive) heart failure: Secondary | ICD-10-CM

## 2018-09-21 DIAGNOSIS — K5909 Other constipation: Secondary | ICD-10-CM | POA: Diagnosis not present

## 2018-09-21 DIAGNOSIS — M9712XS Periprosthetic fracture around internal prosthetic left knee joint, sequela: Secondary | ICD-10-CM

## 2018-09-21 DIAGNOSIS — G250 Essential tremor: Secondary | ICD-10-CM | POA: Diagnosis not present

## 2018-09-21 NOTE — Progress Notes (Signed)
Location:   [facility] Room Number: 204 [id number] Place of Service:  SNF (31)   CODE STATUS: Full Code  No Known Allergies  Chief Complaint  Patient presents with  . Medical Management of Chronic Issues    Acute on chronic diastolic heart failure; chronic constipation; benign essential tremor; periprosthetic fracture around internal prosthetic left knee joint. Weekly follow up for the first thirty days post hospitalization.      HPI:  He is a 83 year old short term resident of this facility seen for the management of her chronic illnesses: diastolic heart failure; constipation; tremor; periprosthetic fracture. He has gained weight and is up to 307 pounds today. He denies any cough; shortness of breath or chest pain; his pain is being managed.   Past Medical History:  Diagnosis Date  . CAD (coronary artery disease)   . Cervicalgia   . CHF (congestive heart failure) (HCC)   . COPD (chronic obstructive pulmonary disease) (HCC)   . Diastolic heart failure (HCC)   . Foot drop, right   . History of kidney stones   . Hyperlipidemia    unspecified  . Hypertension   . Myocardial infarction (HCC)   . Osteoarthritis   . Shoulder pain, left   . Sleep apnea   . Tremor, essential     Past Surgical History:  Procedure Laterality Date  . APPLICATION OF WOUND VAC Right 11/13/2017   Procedure: APPLICATION OF WOUND VAC;  Surgeon: Annice Needyew, Jason S, MD;  Location: [facility] ORS;  Service: Vascular;  Laterality: Right;  . BACK SURGERY  06/2010  . CARDIAC CATHETERIZATION Left 04/30/2016   Procedure: Left Heart Cath and Coronary Angiography;  Surgeon: Laurier NancyShaukat A Khan, MD;  Location: [facility] INVASIVE CV LAB;  Service: Cardiovascular;  Laterality: Left;  . COLONOSCOPY    . CORONARY ANGIOPLASTY    . CORONARY/GRAFT ACUTE MI REVASCULARIZATION N/A 06/21/2018   Procedure: Coronary/Graft Acute MI Revascularization;  Surgeon: Iran OuchArida, Muhammad A, MD;  Location: [facility] INVASIVE CV LAB;  Service:  Cardiovascular;  Laterality: N/A;  . KNEE ARTHROPLASTY Left 06/01/2018   Procedure: COMPUTER ASSISTED TOTAL KNEE ARTHROPLASTY;  Surgeon: Donato HeinzHooten, James P, MD;  Location: [facility] ORS;  Service: Orthopedics;  Laterality: Left;  . KNEE ARTHROSCOPY Left   . KNEE SURGERY Left 1998  . LEFT HEART CATH AND CORONARY ANGIOGRAPHY N/A 06/21/2018   Procedure: LEFT HEART CATH AND CORONARY ANGIOGRAPHY;  Surgeon: Iran OuchArida, Muhammad A, MD;  Location: [facility] INVASIVE CV LAB;  Service: Cardiovascular;  Laterality: N/A;  . ORIF FEMUR FRACTURE Left 08/27/2018   Procedure: OPEN REDUCTION INTERNAL FIXATION (ORIF) SUPRACONDYLAR FEMUR FRACTURE ABOVE PROSTHESIS, QUADRICEPS REPAIR;  Surgeon: Kennedy BuckerMenz, Michael, MD;  Location: [facility] ORS;  Service: Orthopedics;  Laterality: Left;  . TONSILLECTOMY    . WOUND DEBRIDEMENT Right 11/13/2017   Procedure: DEBRIDEMENT WOUND;  Surgeon: Annice Needyew, Jason S, MD;  Location: [facility] ORS;  Service: Vascular;  Laterality: Right;    Social History   Socioeconomic History  . Marital status: Married    Spouse name: Eber JonesCarolyn  . Number of children: 2  . Years of education: 6711  . Highest education level: 11th grade  Occupational History  . Occupation: retired  Engineer, productionocial Needs  . Financial resource strain: Not on file  . Food insecurity:    Worry: Not on file    Inability: Not on file  . Transportation needs:    Medical: Not on file    Non-medical: Not on file  Tobacco Use  . Smoking  status: Former Smoker    Packs/day: 0.25    Years: 58.00    Pack years: 14.50    Types: Cigarettes  . Smokeless tobacco: Former NeurosurgeonUser  . Tobacco comment: quit the begginning of septembet 2019  Substance and Sexual Activity  . Alcohol use: No    Alcohol/week: 0.0 standard drinks  . Drug use: No  . Sexual activity: Not on file  Lifestyle  . Physical activity:    Days per week: Not on file    Minutes per session: Not on file  . Stress: Not on file  Relationships  . Social connections:    Talks on phone: Not on file     Gets together: Not on file    Attends religious service: Not on file    Active member of club or organization: Not on file    Attends meetings of clubs or organizations: Not on file    Relationship status: Not on file  . Intimate partner violence:    Fear of current or ex partner: Not on file    Emotionally abused: Not on file    Physically abused: Not on file    Forced sexual activity: Not on file  Other Topics Concern  . Not on file  Social History Narrative   Full Code with HCPOA and living will   Married with 2 children   Former smoker   Alcohol - none   Smokeless tobacco - none   Family History  Problem Relation Age of Onset  . Diabetes Son   . Cancer Mother   . Colon cancer Mother   . Lung cancer Mother   . Tremor Father   . Heart disease Father   . Tremor Brother   . Bladder Cancer Brother   . Tremor Sister       VITAL SIGNS BP (!) 155/99   Pulse 68   Temp 98.9 F (37.2 C)   Resp 18   Ht 5\' 10"  (1.778 m)   Wt (!) 307 lb 11.2 oz (139.6 kg)   SpO2 98%   BMI 44.15 kg/m   Outpatient Encounter Medications as of 09/21/2018  Medication Sig  . acetaminophen (TYLENOL) 500 MG tablet Take 1,000 mg by mouth 2 (two) times daily as needed for moderate pain.  Marland Kitchen. alum & mag hydroxide-simeth (MAALOX PLUS) 400-400-40 MG/5ML suspension Take 30 mLs by mouth every 4 (four) hours as needed for indigestion.  . Ascorbic Acid (VITAMIN C PO) Take 1 tablet by mouth daily.  Marland Kitchen. aspirin EC 81 MG tablet Take 81 mg by mouth daily.  Marland Kitchen. atorvastatin (LIPITOR) 80 MG tablet Take 80 mg by mouth daily.  . carvedilol (COREG) 6.25 MG tablet Take 6.25 mg by mouth 2 (two) times daily with a meal.  . Cholecalciferol 100 MCG (4000 UT) TABS Take 1 tablet by mouth daily.  . clopidogrel (PLAVIX) 75 MG tablet Take 75 mg by mouth daily.  . collagenase (SANTYL) ointment Cleanse right first toe with normal saline,  Apply santyl nickel thick (1/4 inch)  to wound bed, cover with normal saline moist gauze and  cover with Kerlix  . dextromethorphan-guaiFENesin (MUCINEX DM) 30-600 MG 12hr tablet Take 1 tablet by mouth 2 (two) times daily as needed for cough.  . gabapentin (NEURONTIN) 400 MG capsule Take 400 mg by mouth at bedtime.  Marland Kitchen. HYDROcodone-acetaminophen (NORCO/VICODIN) 5-325 MG tablet Take 1 tablet by mouth every 4 (four) hours as needed for moderate pain.  Marland Kitchen. ipratropium-albuterol (DUONEB) 0.5-2.5 (3) MG/3ML SOLN Take  3 mLs by nebulization every 6 (six) hours as needed (shortness of breath).   . isosorbide mononitrate (IMDUR) 30 MG 24 hr tablet Take 30 mg by mouth daily.   Marland Kitchen ketotifen (ZADITOR) 0.025 % ophthalmic solution Place 1 drop into both eyes 2 (two) times daily.  Marland Kitchen loratadine (CLARITIN) 10 MG tablet Take 10 mg by mouth daily as needed for allergies.  . Melatonin 3 MG TABS Take 1 tablet by mouth at bedtime.  . Multiple Vitamins-Minerals (CENTROVITE) TABS Take 1 tablet by mouth daily.  . nitroGLYCERIN (NITROSTAT) 0.3 MG SL tablet Place 0.3 mg under the tongue every 5 (five) minutes as needed for chest pain.  . NON FORMULARY Diet type:  NAS  . nystatin (NYSTATIN) powder Apply 1 application topically to yeast rash in groin and abdominal folds two times daily  . OXYGEN Inhale 2 L/min into the lungs continuous.  . polyethylene glycol (MIRALAX / GLYCOLAX) packet Take 17 g by mouth 2 (two) times daily.  . potassium chloride (K-DUR,KLOR-CON) 10 MEQ tablet Take 10 mEq by mouth daily.   . primidone (MYSOLINE) 50 MG tablet Take 200 mg by mouth 2 (two) times daily.   . sacubitril-valsartan (ENTRESTO) 24-26 MG Take 1 tablet by mouth 2 (two) times daily.  . sennosides-docusate sodium (SENOKOT-S) 8.6-50 MG tablet Take 2 tablets by mouth 2 (two) times daily.  . Skin Protectants, Misc. (ENDIT EX) Apply to bottom and moisture associated breakdown of coccyx area BID and as need  . spironolactone (ALDACTONE) 25 MG tablet Take 12.5 mg by mouth daily.  . tamsulosin (FLOMAX) 0.4 MG CAPS capsule Take 1 capsule  (0.4 mg total) by mouth daily.  Marland Kitchen torsemide (DEMADEX) 20 MG tablet Take 20 mg by mouth daily.   Marland Kitchen umeclidinium-vilanterol (ANORO ELLIPTA) 62.5-25 MCG/INH AEPB Inhale 1 puff into the lungs daily.  . [DISCONTINUED] polyethylene glycol (MIRALAX / GLYCOLAX) packet Take 17 g by mouth daily. (Patient not taking: Reported on 09/21/2018)   No facility-administered encounter medications on file as of 09/21/2018.      SIGNIFICANT DIAGNOSTIC EXAMS  PREVIOUS:   08-26-18: ct of head: No acute finding by CT. Atrophy and extensive chronic small-vessel ischemic changes. 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  08-26-18: lumbar spine x-ray: Single AP view of the lumbar spine shows normal spinal alignment.  08-26-18: left femur fracture: Comminuted displaced fracture of the distal femoral metaphysis above the femoral arthroplasty component.  08-26-18: left knee x-ray: Comminuted fracture distal femoral metaphysis above the femoral arthroplasty component  08-26-18: chest x-ray;  1. Retrocardiac airspace opacity with obscuration of the left costophrenic angle, not entirely specific but possibly from layering pleural effusion of passive atelectasis. 2.  Aortic Atherosclerosis  3. Mild enlargement of the cardiopericardial silhouette. No appreciable edema.  09-01-18: chest x-ray:  1. Slightly worsened bibasilar airspace opacities favored to represent a combination of layering pleural effusions with atelectasis and/or infiltrate. 2. Pulmonary vascular congestion bordering on mild edema.  09-01-18: renal ultrasound: No hydronephrosis is identified bilaterally. Right kidney cysts.  NO NEW EXAMS.   LABS REVIEWED PREVIOUS:   08-26-18: wbc 9.7; hgb 10.2; hct 32.5; mcv 99.7; plt 136; glucose 136; bun 38; creat 1.95; k+ 3.9; na++ 139; ca 8.2 liver normal albumin 3.0; urine culture no growth;  08-28-28: wbc 7.3; hgb 7.2; hct 23.0; mcv 100;4; plt 114 glucose 132; bun 42; creat 1.93; k+ 4.0; na++ 139; ca  7.6 08-30-18: wbc 8.4; hgb 8.8; hct 28.0; mcv 97.2 plt 150; glucose 134; bun 37; creat 1.42; k+  4.2; na++ 139; ca 8.2  09-02-18" wbc 9.1; hgb 8.5; hct 27.0; mcv 100.4; plt 209; glucose 128; bun 41; creat 1.47; k+ 3.3; na++ 140; ca 8.1 09-04-18: wbc 7.7; hgb 9.1; hct 290.; mcv 99.3; plt 218; glucose 105; bun 36; creat 1.28; k+ 3.7; na++ 141; ca 8.  TODAY:   09-19-18: glucose 98; bun 33; creat 1.18  k+ 3.5; na++ 140 ca 8.2   Review of Systems  Constitutional: Negative for malaise/fatigue.  Respiratory: Negative for cough and shortness of breath.   Cardiovascular: Negative for chest pain, palpitations and leg swelling.  Gastrointestinal: Negative for abdominal pain, constipation and heartburn.  Musculoskeletal: Negative for back pain, joint pain and myalgias.  Skin: Negative.   Neurological: Negative for dizziness.  Psychiatric/Behavioral: The patient is not nervous/anxious.       Physical Exam Constitutional:      General: He is not in acute distress.    Appearance: He is well-developed. He is obese. He is not diaphoretic.  Neck:     Musculoskeletal: Neck supple.     Thyroid: No thyromegaly.  Cardiovascular:     Rate and Rhythm: Normal rate and regular rhythm.     Pulses: Normal pulses.     Heart sounds: Murmur present.     Comments: 1/6 Pulmonary:     Effort: Pulmonary effort is normal. No respiratory distress.     Breath sounds: Normal breath sounds.  Abdominal:     General: Bowel sounds are normal. There is no distension.     Palpations: Abdomen is soft.     Tenderness: There is no abdominal tenderness.  Musculoskeletal:     Right lower leg: No edema.     Left lower leg: No edema.     Comments: Has left lower extremity immobilizer in place Is able to move other extremities  History of left knee replacement   Has limited range of motion in upper extremities     Lymphadenopathy:     Cervical: No cervical adenopathy.  Skin:    General: Skin is warm and dry.     Comments:  Bilateral lower extremities discolored Left incision line without signs of infection;   Neurological:     Mental Status: He is alert and oriented to person, place, and time.     ASSESSMENT/ PLAN:  TODAY:   1. Chronic constipation: is stable will continue senna s 2 tabs twice daily and miralax twice daily   2. Benign essential tremor: is stable will continue primidone 200 mg twice daily   3. Periprosthetic fracture around internal prosthetic left knee joint: is stable will continue therapy as directed and will follow up with orthopedics; will continue vicodin 5/325 mg every 4 hours as needed for pain  4.  Acute on chronic diastolic heart failure: EF 30-35% (06-30-18): is worse will continue coreg 6.25 mg twice daily; entresto 24/26 mg twice daily aldactone 12.5 mg daily   Will increase demadex to 40 mg daily and will monitor his status.   PREVIOUS   5. Hyperlipidemia: is stable will continue lipitor 80 mg daily   6. BPH with obstruction/lower urinary tract symptoms: is stable will continue flomax 0.4 mg daily   7. Obstructive sleep apnea: is stable will continue CPAP  8. Idiopathic peripheral neuropathy: is stable will continue neurontin 400 mg daily   9. Chronic kidney disease stage 3 mod decreased GFR: is stable bun 36; creat 1.28  10. Anemia of chronic renal disease stage 3: is stable hgb 9.1 received blood  transfusion in hospital.   11. Coronary atherosclerosis of native coronary artery of native heart with other forms of angina pectoris status post STEMI and PCI: is stable; will continue coreg 6.25 mg twice daily asa 81 mg daily plavix 75 mg daily imdur 30 mg daily has prn ntg  12. COPD, severe: is stable is 02 dependent: will continue mucinex dm twice daily as needed; claritin 10 mg daily as needed; duoneb every 6 hours as needed; and anora ellipta 62.5-25 mg 1 puff daily   14. Hypertensive heart and kidney disease with acute on chronic diastolic congestive heart failure and  stage 3 chronic kidney disease: is stable: b/p 155/99 will continue coreg 6.25 mg twice daily aldactone 12.5 mg daily and  asa 81 mg daily    MD is aware of resident's narcotic use and is in agreement with current plan of care. We will attempt to wean resident as apropriate   Synthia Innocent NP [facility]  Contact [phone] Monday through Friday 8am- 5pm  After hours call [phone]

## 2018-09-28 ENCOUNTER — Non-Acute Institutional Stay (SKILLED_NURSING_FACILITY): Payer: Medicare Other | Admitting: Adult Health

## 2018-09-28 ENCOUNTER — Other Ambulatory Visit
Admission: RE | Admit: 2018-09-28 | Discharge: 2018-09-28 | Disposition: A | Discharge status: Home / Self Care | Payer: Medicare Other | Source: Ambulatory Visit | Attending: Adult Health | Admitting: Adult Health

## 2018-09-28 ENCOUNTER — Encounter: Payer: Self-pay | Admitting: Adult Health

## 2018-09-28 DIAGNOSIS — N183 Chronic kidney disease, stage 3 unspecified: Secondary | ICD-10-CM

## 2018-09-28 DIAGNOSIS — D631 Anemia in chronic kidney disease: Secondary | ICD-10-CM

## 2018-09-28 DIAGNOSIS — I25118 Atherosclerotic heart disease of native coronary artery with other forms of angina pectoris: Secondary | ICD-10-CM

## 2018-09-28 DIAGNOSIS — I130 Hypertensive heart and chronic kidney disease with heart failure and stage 1 through stage 4 chronic kidney disease, or unspecified chronic kidney disease: Secondary | ICD-10-CM | POA: Insufficient documentation

## 2018-09-28 DIAGNOSIS — E876 Hypokalemia: Secondary | ICD-10-CM | POA: Diagnosis not present

## 2018-09-28 LAB — BASIC METABOLIC PANEL
Anion gap: 7 (ref 5–15)
BUN: 28 mg/dL — ABNORMAL HIGH (ref 8–23)
CO2: 31 mmol/L (ref 22–32)
Calcium: 8.3 mg/dL — ABNORMAL LOW (ref 8.9–10.3)
Chloride: 103 mmol/L (ref 98–111)
Creatinine, Ser: 1.04 mg/dL (ref 0.61–1.24)
GFR calc Af Amer: >60 mL/min (ref >60)
GFR calc non Af Amer: >60 mL/min (ref >60)
Glucose, Bld: 104 mg/dL — ABNORMAL HIGH (ref 70–99)
Potassium: 3.3 mmol/L — ABNORMAL LOW (ref 3.5–5.1)
Sodium: 141 mmol/L (ref 135–145)

## 2018-09-28 NOTE — Progress Notes (Signed)
[facility]   [facility] Room Number: 204 [id number] Place of Service:  SNF (31)   CODE STATUS: Full Code  No Known Allergies  Chief Complaint  Patient presents with  . Medical Management of Chronic Issues    Atherosclerosis of native coronary artery of native heart with other forms of angina pectoris; chronic kidney disease stage 3 mod decreased GFR; anemia of chronic renal failure stage 3 (moderate); hypokalemia. Weekly follow up for the first 30 days post hospitalization.     HPI:  He is a 83 year old short term rehab patient being seen for the management of his chronic illnesses: cad; ckd stage 3; anemia; hypokalemia. He denies any uncontrolled pain; no changes in appetite; no chest pain. His k+ is slightly low at 3.3.   Past Medical History:  Diagnosis Date  . CAD (coronary artery disease)   . Cervicalgia   . CHF (congestive heart failure) (HCC)   . COPD (chronic obstructive pulmonary disease) (HCC)   . Diastolic heart failure (HCC)   . Foot drop, right   . History of kidney stones   . Hyperlipidemia    unspecified  . Hypertension   . Myocardial infarction (HCC)   . Osteoarthritis   . Shoulder pain, left   . Sleep apnea   . Tremor, essential     Past Surgical History:  Procedure Laterality Date  . APPLICATION OF WOUND VAC Right 11/13/2017   Procedure: APPLICATION OF WOUND VAC;  Surgeon: Annice Needyew, Jason S, MD;  Location: [facility] ORS;  Service: Vascular;  Laterality: Right;  . BACK SURGERY  06/2010  . CARDIAC CATHETERIZATION Left 04/30/2016   Procedure: Left Heart Cath and Coronary Angiography;  Surgeon: Laurier NancyShaukat A Khan, MD;  Location: [facility] INVASIVE CV LAB;  Service: Cardiovascular;  Laterality: Left;  . COLONOSCOPY    . CORONARY ANGIOPLASTY    . CORONARY/GRAFT ACUTE MI REVASCULARIZATION N/A 06/21/2018   Procedure: Coronary/Graft Acute MI Revascularization;  Surgeon: Iran OuchArida, Muhammad A, MD;  Location: [facility] INVASIVE CV LAB;  Service: Cardiovascular;   Laterality: N/A;  . KNEE ARTHROPLASTY Left 06/01/2018   Procedure: COMPUTER ASSISTED TOTAL KNEE ARTHROPLASTY;  Surgeon: Donato HeinzHooten, James P, MD;  Location: [facility] ORS;  Service: Orthopedics;  Laterality: Left;  . KNEE ARTHROSCOPY Left   . KNEE SURGERY Left 1998  . LEFT HEART CATH AND CORONARY ANGIOGRAPHY N/A 06/21/2018   Procedure: LEFT HEART CATH AND CORONARY ANGIOGRAPHY;  Surgeon: Iran OuchArida, Muhammad A, MD;  Location: [facility] INVASIVE CV LAB;  Service: Cardiovascular;  Laterality: N/A;  . ORIF FEMUR FRACTURE Left 08/27/2018   Procedure: OPEN REDUCTION INTERNAL FIXATION (ORIF) SUPRACONDYLAR FEMUR FRACTURE ABOVE PROSTHESIS, QUADRICEPS REPAIR;  Surgeon: Kennedy BuckerMenz, Michael, MD;  Location: [facility] ORS;  Service: Orthopedics;  Laterality: Left;  . TONSILLECTOMY    . WOUND DEBRIDEMENT Right 11/13/2017   Procedure: DEBRIDEMENT WOUND;  Surgeon: Annice Needyew, Jason S, MD;  Location: [facility] ORS;  Service: Vascular;  Laterality: Right;    Social History   Socioeconomic History  . Marital status: Married    Spouse name: Eber JonesCarolyn  . Number of children: 2  . Years of education: 1211  . Highest education level: 11th grade  Occupational History  . Occupation: retired  Engineer, productionocial Needs  . Financial resource strain: Not on file  . Food insecurity:    Worry: Not on file    Inability: Not on file  . Transportation needs:    Medical: Not on file    Non-medical: Not on file  Tobacco Use  .  Smoking status: Former Smoker    Packs/day: 0.25    Years: 58.00    Pack years: 14.50    Types: Cigarettes  . Smokeless tobacco: Former Neurosurgeon  . Tobacco comment: quit the begginning of septembet 2019  Substance and Sexual Activity  . Alcohol use: No    Alcohol/week: 0.0 standard drinks  . Drug use: No  . Sexual activity: Not on file  Lifestyle  . Physical activity:    Days per week: Not on file    Minutes per session: Not on file  . Stress: Not on file  Relationships  . Social connections:    Talks on phone: Not on file    Gets together:  Not on file    Attends religious service: Not on file    Active member of club or organization: Not on file    Attends meetings of clubs or organizations: Not on file    Relationship status: Not on file  . Intimate partner violence:    Fear of current or ex partner: Not on file    Emotionally abused: Not on file    Physically abused: Not on file    Forced sexual activity: Not on file  Other Topics Concern  . Not on file  Social History Narrative   Full Code with HCPOA and living will   Married with 2 children   Former smoker   Alcohol - none   Smokeless tobacco - none   Family History  Problem Relation Age of Onset  . Diabetes Son   . Cancer Mother   . Colon cancer Mother   . Lung cancer Mother   . Tremor Father   . Heart disease Father   . Tremor Brother   . Bladder Cancer Brother   . Tremor Sister       VITAL SIGNS BP 137/68   Pulse 62   Temp 98.3 F (36.8 C)   Resp 20   Ht 5\' 10"  (1.778 m)   Wt (!) 303 lb 9.6 oz (137.7 kg)   SpO2 100%   BMI 43.56 kg/m   Outpatient Encounter Medications as of 09/28/2018  Medication Sig  . acetaminophen (TYLENOL) 500 MG tablet Take 1,000 mg by mouth 2 (two) times daily as needed for moderate pain.  Marland Kitchen alum & mag hydroxide-simeth (MAALOX PLUS) 400-400-40 MG/5ML suspension Take 30 mLs by mouth every 4 (four) hours as needed for indigestion.  . Ascorbic Acid (VITAMIN C PO) Take 1 tablet by mouth daily.  Marland Kitchen aspirin EC 81 MG tablet Take 81 mg by mouth daily.  Marland Kitchen atorvastatin (LIPITOR) 80 MG tablet Take 80 mg by mouth daily.  . carvedilol (COREG) 6.25 MG tablet Take 6.25 mg by mouth 2 (two) times daily with a meal.  . Cholecalciferol 100 MCG (4000 UT) TABS Take 1 tablet by mouth daily.  . clopidogrel (PLAVIX) 75 MG tablet Take 75 mg by mouth daily.  . collagenase (SANTYL) ointment Cleanse right first toe with normal saline,  Apply santyl nickel thick (1/4 inch)  to wound bed, cover with normal saline moist gauze and cover with Kerlix    . dextromethorphan-guaiFENesin (MUCINEX DM) 30-600 MG 12hr tablet Take 1 tablet by mouth 2 (two) times daily as needed for cough.  . gabapentin (NEURONTIN) 400 MG capsule Take 400 mg by mouth at bedtime.  Marland Kitchen HYDROcodone-acetaminophen (NORCO/VICODIN) 5-325 MG tablet Take 1 tablet by mouth every 4 (four) hours as needed for moderate pain.  Marland Kitchen ipratropium-albuterol (DUONEB) 0.5-2.5 (3) MG/3ML SOLN  Take 3 mLs by nebulization every 6 (six) hours as needed (shortness of breath).   . isosorbide mononitrate (IMDUR) 30 MG 24 hr tablet Take 30 mg by mouth daily.   Marland Kitchen. ketotifen (ZADITOR) 0.025 % ophthalmic solution Place 1 drop into both eyes 2 (two) times daily.  Marland Kitchen. loratadine (CLARITIN) 10 MG tablet Take 10 mg by mouth daily as needed for allergies.  . Melatonin 3 MG TABS Take 1 tablet by mouth at bedtime.  . Multiple Vitamins-Minerals (CENTROVITE) TABS Take 1 tablet by mouth daily.  . nitroGLYCERIN (NITROSTAT) 0.3 MG SL tablet Place 0.3 mg under the tongue every 5 (five) minutes as needed for chest pain.  . NON FORMULARY Diet type:  NAS  . OXYGEN Inhale 2 L/min into the lungs continuous.  . polyethylene glycol (MIRALAX / GLYCOLAX) packet Take 17 g by mouth 2 (two) times daily.  . potassium chloride (K-DUR,KLOR-CON) 10 MEQ tablet Take 10 mEq by mouth daily.   . primidone (MYSOLINE) 50 MG tablet Take 200 mg by mouth 2 (two) times daily.   . sacubitril-valsartan (ENTRESTO) 24-26 MG Take 1 tablet by mouth 2 (two) times daily.  . sennosides-docusate sodium (SENOKOT-S) 8.6-50 MG tablet Take 2 tablets by mouth 2 (two) times daily.  . Skin Protectants, Misc. (ENDIT EX) Apply to bottom and moisture associated breakdown of coccyx area BID and as need  . spironolactone (ALDACTONE) 25 MG tablet Take 12.5 mg by mouth daily.  . tamsulosin (FLOMAX) 0.4 MG CAPS capsule Take 1 capsule (0.4 mg total) by mouth daily.  Marland Kitchen. torsemide (DEMADEX) 20 MG tablet Take 40 mg by mouth daily.   Marland Kitchen. umeclidinium-vilanterol (ANORO ELLIPTA)  62.5-25 MCG/INH AEPB Inhale 1 puff into the lungs daily.   No facility-administered encounter medications on file as of 09/28/2018.      SIGNIFICANT DIAGNOSTIC EXAMS  PREVIOUS:   08-26-18: ct of head: No acute finding by CT. Atrophy and extensive chronic small-vessel ischemic changes. 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  08-26-18: lumbar spine x-ray: Single AP view of the lumbar spine shows normal spinal alignment.  08-26-18: left femur fracture: Comminuted displaced fracture of the distal femoral metaphysis above the femoral arthroplasty component.  08-26-18: left knee x-ray: Comminuted fracture distal femoral metaphysis above the femoral arthroplasty component  08-26-18: chest x-ray;  1. Retrocardiac airspace opacity with obscuration of the left costophrenic angle, not entirely specific but possibly from layering pleural effusion of passive atelectasis. 2.  Aortic Atherosclerosis  3. Mild enlargement of the cardiopericardial silhouette. No appreciable edema.  09-01-18: chest x-ray:  1. Slightly worsened bibasilar airspace opacities favored to represent a combination of layering pleural effusions with atelectasis and/or infiltrate. 2. Pulmonary vascular congestion bordering on mild edema.  09-01-18: renal ultrasound: No hydronephrosis is identified bilaterally. Right kidney cysts.  NO NEW EXAMS.   LABS REVIEWED PREVIOUS:   08-26-18: wbc 9.7; hgb 10.2; hct 32.5; mcv 99.7; plt 136; glucose 136; bun 38; creat 1.95; k+ 3.9; na++ 139; ca 8.2 liver normal albumin 3.0; urine culture no growth;  08-28-28: wbc 7.3; hgb 7.2; hct 23.0; mcv 100;4; plt 114 glucose 132; bun 42; creat 1.93; k+ 4.0; na++ 139; ca 7.6 08-30-18: wbc 8.4; hgb 8.8; hct 28.0; mcv 97.2 plt 150; glucose 134; bun 37; creat 1.42; k+ 4.2; na++ 139; ca 8.2  09-02-18" wbc 9.1; hgb 8.5; hct 27.0; mcv 100.4; plt 209; glucose 128; bun 41; creat 1.47; k+ 3.3; na++ 140; ca 8.1 09-04-18: wbc 7.7; hgb 9.1; hct 290.; mcv 99.3; plt 218;  glucose 105; bun 36; creat 1.28; k+ 3.7; na++ 141; ca 8. 09-19-18: glucose 98; bun 33; creat 1.18  k+ 3.5; na++ 140 ca 8.2   TODAY:   09-28-18: glucose 104; bun 28; creat 1.04 k+ 3.3; na++ 141; ca 8.3   Review of Systems  Constitutional: Negative for malaise/fatigue.  Respiratory: Negative for cough and shortness of breath.   Cardiovascular: Negative for chest pain, palpitations and leg swelling.  Gastrointestinal: Negative for abdominal pain, constipation and heartburn.  Musculoskeletal: Negative for back pain, joint pain and myalgias.  Skin: Negative.   Neurological: Negative for dizziness.  Psychiatric/Behavioral: The patient is not nervous/anxious.     Physical Exam Constitutional:      General: He is not in acute distress.    Appearance: He is well-developed. He is obese. He is not diaphoretic.  Neck:     Musculoskeletal: Neck supple.     Thyroid: No thyromegaly.  Cardiovascular:     Rate and Rhythm: Normal rate and regular rhythm.     Pulses: Normal pulses.     Heart sounds: Murmur present.     Comments: 1/6 Pulmonary:     Effort: Pulmonary effort is normal. No respiratory distress.     Breath sounds: Normal breath sounds.  Abdominal:     General: Bowel sounds are normal. There is no distension.     Palpations: Abdomen is soft.     Tenderness: There is no abdominal tenderness.  Musculoskeletal:     Right lower leg: No edema.     Left lower leg: No edema.     Comments: Has left lower extremity immobilizer in place Is able to move other extremities  History of left knee replacement   Has limited range of motion in upper extremities      Lymphadenopathy:     Cervical: No cervical adenopathy.  Skin:    General: Skin is warm and dry.     Comments:  Bilateral lower extremities discolored Left incision line without signs of infection;    Neurological:     Mental Status: He is alert and oriented to person, place, and time.  Psychiatric:        Mood and Affect: Mood  normal.        ASSESSMENT/ PLAN:  TODAY:   1. Chronic kidney disease stage 3 mod decreased GFR: is stable bun 28; creat 1.04  2. Anemia of chronic renal disease stage 3: is stable hgb 9.1 received blood transfusion in hospital.   3. Coronary atherosclerosis of native coronary artery of native heart with other forms of angina pectoris status post STEMI and PCI: is stable; will continue coreg 6.25 mg twice daily asa 81 mg daily plavix 75 mg daily imdur 30 mg daily has prn ntg  4. Hypokalemia: is worse: will give k+ 40 meq now then increase to 20 meq daily   PREVIOUS   5. Hyperlipidemia: is stable will continue lipitor 80 mg daily   6. BPH with obstruction/lower urinary tract symptoms: is stable will continue flomax 0.4 mg daily   7. Obstructive sleep apnea: is stable will continue CPAP  8. Idiopathic peripheral neuropathy: is stable will continue neurontin 400 mg daily   9. COPD, severe: is stable is 02 dependent: will continue mucinex dm twice daily as needed; claritin 10 mg daily as needed; duoneb every 6 hours as needed; and anora ellipta 62.5-25 mg 1 puff daily   10. Hypertensive heart and kidney disease with acute on chronic diastolic congestive heart failure and  stage 3 chronic kidney disease: is stable: b/p 137/68 will continue coreg 6.25 mg twice daily aldactone 12.5 mg daily and  asa 81 mg daily   11. Chronic constipation: is stable will continue senna s 2 tabs twice daily and miralax twice daily   12. Benign essential tremor: is stable will continue primidone 200 mg twice daily   13. Periprosthetic fracture around internal prosthetic left knee joint: is stable will continue therapy as directed and will follow up with orthopedics; will continue vicodin 5/325 mg every 4 hours as needed for pain  14.  Acute on chronic diastolic heart failure: EF 30-35% (06-30-18): is stable will continue coreg 6.25 mg twice daily; entresto 24/26 mg twice daily aldactone 12.5 mg daily demadex   40 mg daily and will monitor his status.     MD is aware of resident's narcotic use and is in agreement with current plan of care. We will attempt to wean resident as apropriate   Synthia Innocent NP [facility]  Contact [phone] Monday through Friday 8am- 5pm  After hours call [phone]

## 2018-09-29 ENCOUNTER — Other Ambulatory Visit: Payer: Self-pay | Admitting: Adult Health

## 2018-09-29 MED ORDER — HYDROCODONE-ACETAMINOPHEN 5-325 MG PO TABS: 1.0000 | ORAL_TABLET | ORAL | 0 refills | Status: AC | PRN

## 2018-10-01 DIAGNOSIS — E876 Hypokalemia: Secondary | ICD-10-CM | POA: Insufficient documentation

## 2018-10-03 ENCOUNTER — Encounter
Admission: RE | Admit: 2018-10-03 | Discharge: 2018-10-03 | Disposition: A | Discharge status: Home / Self Care | Payer: Medicare Other | Source: Ambulatory Visit | Attending: Internal Medicine | Admitting: Internal Medicine

## 2018-10-08 ENCOUNTER — Non-Acute Institutional Stay (SKILLED_NURSING_FACILITY): Payer: Medicare Other | Admitting: Adult Health

## 2018-10-08 ENCOUNTER — Encounter: Payer: Self-pay | Admitting: Adult Health

## 2018-10-08 DIAGNOSIS — G609 Hereditary and idiopathic neuropathy, unspecified: Secondary | ICD-10-CM

## 2018-10-08 DIAGNOSIS — E7849 Other hyperlipidemia: Secondary | ICD-10-CM

## 2018-10-08 DIAGNOSIS — N138 Other obstructive and reflux uropathy: Secondary | ICD-10-CM

## 2018-10-08 DIAGNOSIS — G4733 Obstructive sleep apnea (adult) (pediatric): Secondary | ICD-10-CM | POA: Diagnosis not present

## 2018-10-08 DIAGNOSIS — N401 Enlarged prostate with lower urinary tract symptoms: Secondary | ICD-10-CM

## 2018-10-08 NOTE — Progress Notes (Signed)
Location:   [facility] Room Number: 204 A Place of Service:  SNF (31)   CODE STATUS: Full Code  No Known Allergies  Chief Complaint  Patient presents with  . Medical Management of Chronic Issues    Other hyperlipidemia; BPH with obstruction/lower urinary tract symptoms; obstructive sleep apnea of adult; idiopathic peripheral neuropathy.     HPI:  He is 83 year old short term rehab patient being seen for the management of his chronic illnesses; hyperlipidemia; bph; osa; neuropathy. He denies any uncontrolled pain; no changes in appetite; no cough or shortness of breath.   Past Medical History:  Diagnosis Date  . CAD (coronary artery disease)   . Cervicalgia   . CHF (congestive heart failure) (HCC)   . COPD (chronic obstructive pulmonary disease) (HCC)   . Diastolic heart failure (HCC)   . Foot drop, right   . History of kidney stones   . Hyperlipidemia    unspecified  . Hypertension   . Myocardial infarction (HCC)   . Osteoarthritis   . Shoulder pain, left   . Sleep apnea   . Tremor, essential     Past Surgical History:  Procedure Laterality Date  . APPLICATION OF WOUND VAC Right 11/13/2017   Procedure: APPLICATION OF WOUND VAC;  Surgeon: Annice Needy, MD;  Location: [facility] ORS;  Service: Vascular;  Laterality: Right;  . BACK SURGERY  06/2010  . CARDIAC CATHETERIZATION Left 04/30/2016   Procedure: Left Heart Cath and Coronary Angiography;  Surgeon: Laurier Nancy, MD;  Location: [facility] INVASIVE CV LAB;  Service: Cardiovascular;  Laterality: Left;  . COLONOSCOPY    . CORONARY ANGIOPLASTY    . CORONARY/GRAFT ACUTE MI REVASCULARIZATION N/A 06/21/2018   Procedure: Coronary/Graft Acute MI Revascularization;  Surgeon: Iran Ouch, MD;  Location: [facility] INVASIVE CV LAB;  Service: Cardiovascular;  Laterality: N/A;  . KNEE ARTHROPLASTY Left 06/01/2018   Procedure: COMPUTER ASSISTED TOTAL KNEE ARTHROPLASTY;  Surgeon: Donato Heinz, MD;  Location:  [facility] ORS;  Service: Orthopedics;  Laterality: Left;  . KNEE ARTHROSCOPY Left   . KNEE SURGERY Left 1998  . LEFT HEART CATH AND CORONARY ANGIOGRAPHY N/A 06/21/2018   Procedure: LEFT HEART CATH AND CORONARY ANGIOGRAPHY;  Surgeon: Iran Ouch, MD;  Location: [facility] INVASIVE CV LAB;  Service: Cardiovascular;  Laterality: N/A;  . ORIF FEMUR FRACTURE Left 08/27/2018   Procedure: OPEN REDUCTION INTERNAL FIXATION (ORIF) SUPRACONDYLAR FEMUR FRACTURE ABOVE PROSTHESIS, QUADRICEPS REPAIR;  Surgeon: Kennedy Bucker, MD;  Location: [facility] ORS;  Service: Orthopedics;  Laterality: Left;  . TONSILLECTOMY    . WOUND DEBRIDEMENT Right 11/13/2017   Procedure: DEBRIDEMENT WOUND;  Surgeon: Annice Needy, MD;  Location: [facility] ORS;  Service: Vascular;  Laterality: Right;    Social History   Socioeconomic History  . Marital status: Married    Spouse name: [name]  . Number of children: 2  . Years of education: 63  . Highest education level: 11th grade  Occupational History  . Occupation: retired  Engineer, production  . Financial resource strain: Not on file  . Food insecurity:    Worry: Not on file    Inability: Not on file  . Transportation needs:    Medical: Not on file    Non-medical: Not on file  Tobacco Use  . Smoking status: Former Smoker    Packs/day: 0.25    Years: 58.00    Pack years: 14.50    Types: Cigarettes  . Smokeless tobacco: Former Neurosurgeon  .  Tobacco comment: quit the begginning of septembet 2019  Substance and Sexual Activity  . Alcohol use: No    Alcohol/week: 0.0 standard drinks  . Drug use: No  . Sexual activity: Not on file  Lifestyle  . Physical activity:    Days per week: Not on file    Minutes per session: Not on file  . Stress: Not on file  Relationships  . Social connections:    Talks on phone: Not on file    Gets together: Not on file    Attends religious service: Not on file    Active member of club or organization: Not on file    Attends meetings of clubs or  organizations: Not on file    Relationship status: Not on file  . Intimate partner violence:    Fear of current or ex partner: Not on file    Emotionally abused: Not on file    Physically abused: Not on file    Forced sexual activity: Not on file  Other Topics Concern  . Not on file  Social History Narrative   Full Code with HCPOA and living will   Married with 2 children   Former smoker   Alcohol - none   Smokeless tobacco - none   Family History  Problem Relation Age of Onset  . Diabetes Son   . Cancer Mother   . Colon cancer Mother   . Lung cancer Mother   . Tremor Father   . Heart disease Father   . Tremor Brother   . Bladder Cancer Brother   . Tremor Sister       VITAL SIGNS BP 128/76   Pulse 69   Temp 98.7 F (37.1 C)   Resp (!) 22   Ht 5\' 10"  (1.778 m)   Wt (!) 303 lb 1.6 oz (137.5 kg)   SpO2 100%   BMI 43.49 kg/m   Outpatient Encounter Medications as of 10/08/2018  Medication Sig  . acetaminophen (TYLENOL) 500 MG tablet Take 1,000 mg by mouth 2 (two) times daily as needed for moderate pain.  Marland Kitchen alum & mag hydroxide-simeth (MAALOX PLUS) 400-400-40 MG/5ML suspension Take 30 mLs by mouth every 4 (four) hours as needed for indigestion.  . Ascorbic Acid (VITAMIN C PO) Take 1 tablet by mouth daily.  Marland Kitchen aspirin EC 81 MG tablet Take 81 mg by mouth daily.  Marland Kitchen atorvastatin (LIPITOR) 80 MG tablet Take 80 mg by mouth daily.  . carvedilol (COREG) 6.25 MG tablet Take 6.25 mg by mouth 2 (two) times daily with a meal.  . Cholecalciferol 100 MCG (4000 UT) TABS Take 1 tablet by mouth daily.  . clopidogrel (PLAVIX) 75 MG tablet Take 75 mg by mouth daily.  . collagenase (SANTYL) ointment Cleanse right first toe with normal saline,  Apply santyl nickel thick (1/4 inch)  to wound bed, cover with normal saline moist gauze and cover with Kerlix  . dextromethorphan-guaiFENesin (MUCINEX DM) 30-600 MG 12hr tablet Take 1 tablet by mouth 2 (two) times daily as needed for cough.  .  gabapentin (NEURONTIN) 400 MG capsule Take 400 mg by mouth at bedtime.  Marland Kitchen HYDROcodone-acetaminophen (NORCO/VICODIN) 5-325 MG tablet Take 1 tablet by mouth every 4 (four) hours as needed for moderate pain. For 7-10 / 10 pain  . ipratropium-albuterol (DUONEB) 0.5-2.5 (3) MG/3ML SOLN Take 3 mLs by nebulization every 6 (six) hours as needed (shortness of breath).   . isosorbide mononitrate (IMDUR) 30 MG 24 hr tablet Take 30 mg  by mouth daily.   Marland Kitchen ketotifen (ZADITOR) 0.025 % ophthalmic solution Place 1 drop into both eyes 2 (two) times daily.  Marland Kitchen loratadine (CLARITIN) 10 MG tablet Take 10 mg by mouth daily as needed for allergies.  . Melatonin 3 MG TABS Take 1 tablet by mouth at bedtime.  . Multiple Vitamins-Minerals (CENTROVITE) TABS Take 1 tablet by mouth daily.  . nitroGLYCERIN (NITROSTAT) 0.3 MG SL tablet Place 0.3 mg under the tongue every 5 (five) minutes as needed for chest pain.  . NON FORMULARY Diet type:  NAS  . OXYGEN Inhale 2 L/min into the lungs continuous.  . polyethylene glycol (MIRALAX / GLYCOLAX) packet Take 17 g by mouth 2 (two) times daily.  . potassium chloride (K-DUR,KLOR-CON) 10 MEQ tablet Take 10 mEq by mouth daily.   . primidone (MYSOLINE) 50 MG tablet Take 200 mg by mouth 2 (two) times daily.   . sacubitril-valsartan (ENTRESTO) 24-26 MG Take 1 tablet by mouth 2 (two) times daily.  . sennosides-docusate sodium (SENOKOT-S) 8.6-50 MG tablet Take 2 tablets by mouth 2 (two) times daily.  . Skin Protectants, Misc. (ENDIT EX) Apply to bottom and moisture associated breakdown of coccyx area BID and as need  . spironolactone (ALDACTONE) 25 MG tablet Take 12.5 mg by mouth daily.  . tamsulosin (FLOMAX) 0.4 MG CAPS capsule Take 1 capsule (0.4 mg total) by mouth daily.  Marland Kitchen torsemide (DEMADEX) 20 MG tablet Take 40 mg by mouth daily.   Marland Kitchen umeclidinium-vilanterol (ANORO ELLIPTA) 62.5-25 MCG/INH AEPB Inhale 1 puff into the lungs daily.   No facility-administered encounter medications on file as  of 10/08/2018.      SIGNIFICANT DIAGNOSTIC EXAMS  PREVIOUS:   08-26-18: ct of head: No acute finding by CT. Atrophy and extensive chronic small-vessel ischemic changes. 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  08-26-18: lumbar spine x-ray: Single AP view of the lumbar spine shows normal spinal alignment.  08-26-18: left femur fracture: Comminuted displaced fracture of the distal femoral metaphysis above the femoral arthroplasty component.  08-26-18: left knee x-ray: Comminuted fracture distal femoral metaphysis above the femoral arthroplasty component  08-26-18: chest x-ray;  1. Retrocardiac airspace opacity with obscuration of the left costophrenic angle, not entirely specific but possibly from layering pleural effusion of passive atelectasis. 2.  Aortic Atherosclerosis  3. Mild enlargement of the cardiopericardial silhouette. No appreciable edema.  09-01-18: chest x-ray:  1. Slightly worsened bibasilar airspace opacities favored to represent a combination of layering pleural effusions with atelectasis and/or infiltrate. 2. Pulmonary vascular congestion bordering on mild edema.  09-01-18: renal ultrasound: No hydronephrosis is identified bilaterally. Right kidney cysts.  NO NEW EXAMS.   LABS REVIEWED PREVIOUS:   08-26-18: wbc 9.7; hgb 10.2; hct 32.5; mcv 99.7; plt 136; glucose 136; bun 38; creat 1.95; k+ 3.9; na++ 139; ca 8.2 liver normal albumin 3.0; urine culture no growth;  08-28-28: wbc 7.3; hgb 7.2; hct 23.0; mcv 100;4; plt 114 glucose 132; bun 42; creat 1.93; k+ 4.0; na++ 139; ca 7.6 08-30-18: wbc 8.4; hgb 8.8; hct 28.0; mcv 97.2 plt 150; glucose 134; bun 37; creat 1.42; k+ 4.2; na++ 139; ca 8.2  09-02-18" wbc 9.1; hgb 8.5; hct 27.0; mcv 100.4; plt 209; glucose 128; bun 41; creat 1.47; k+ 3.3; na++ 140; ca 8.1 09-04-18: wbc 7.7; hgb 9.1; hct 290.; mcv 99.3; plt 218; glucose 105; bun 36; creat 1.28; k+ 3.7; na++ 141; ca 8. 09-19-18: glucose 98; bun 33; creat 1.18  k+ 3.5; na++ 140 ca  8.2  09-28-18:  glucose 104; bun 28; creat 1.04 k+ 3.3; na++ 141; ca 8.3  NO NEW LABS.    Review of Systems  Constitutional: Negative for malaise/fatigue.  Respiratory: Negative for cough and shortness of breath.   Cardiovascular: Negative for chest pain, palpitations and leg swelling.  Gastrointestinal: Negative for abdominal pain, constipation and heartburn.  Musculoskeletal: Negative for back pain, joint pain and myalgias.  Skin: Negative.   Neurological: Negative for dizziness.  Psychiatric/Behavioral: The patient is not nervous/anxious.       Physical Exam Constitutional:      General: He is not in acute distress.    Appearance: He is well-developed. He is obese. He is not diaphoretic.  Neck:     Musculoskeletal: Neck supple.     Thyroid: No thyromegaly.  Cardiovascular:     Rate and Rhythm: Normal rate and regular rhythm.     Pulses: Normal pulses.     Heart sounds: Murmur present.     Comments: 1/6 Pulmonary:     Effort: Pulmonary effort is normal. No respiratory distress.     Breath sounds: Normal breath sounds.  Abdominal:     General: Bowel sounds are normal. There is no distension.     Palpations: Abdomen is soft.     Tenderness: There is no abdominal tenderness.  Musculoskeletal:     Right lower leg: No edema.     Left lower leg: No edema.     Comments:  Has left lower extremity immobilizer in place Is able to move other extremities  History of left knee replacement   Has limited range of motion in upper extremities       Lymphadenopathy:     Cervical: No cervical adenopathy.  Skin:    General: Skin is warm and dry.     Comments: Bilateral lower extremities discolored Left incision line without signs of infection;     Neurological:     Mental Status: He is alert and oriented to person, place, and time.  Psychiatric:        Mood and Affect: Mood normal.        ASSESSMENT/ PLAN:  TODAY:   1. Hyperlipidemia: is stable will continue lipitor 80 mg  daily   2. BPH with obstruction/lower urinary tract symptoms: is stable will continue flomax 0.4 mg daily   3. Obstructive sleep apnea: is stable will continue CPAP  4. Idiopathic peripheral neuropathy: is stable will continue neurontin 400 mg daily   PREVIOUS   5. COPD, severe: is stable is 02 dependent: will continue mucinex dm twice daily as needed; claritin 10 mg daily as needed; duoneb every 6 hours as needed; and anora ellipta 62.5-25 mg 1 puff daily   6. Hypertensive heart and kidney disease with acute on chronic diastolic congestive heart failure and stage 3 chronic kidney disease: is stable: b/p 128/76 will continue coreg 6.25 mg twice daily aldactone 12.5 mg daily and  asa 81 mg daily   7. Chronic constipation: is stable will continue senna s 2 tabs twice daily and miralax twice daily   8. Benign essential tremor: is stable will continue primidone 200 mg twice daily   9. Periprosthetic fracture around internal prosthetic left knee joint: is stable will continue therapy as directed and will follow up with orthopedics; will continue vicodin 5/325 mg every 4 hours as needed for pain  10.  Acute on chronic diastolic heart failure: EF 30-35% (06-30-18): is stable will continue coreg 6.25 mg twice daily; entresto 24/26 mg twice daily  aldactone 12.5 mg daily demadex  40 mg daily and will monitor his status.   11. Chronic kidney disease stage 3 mod decreased GFR: is stable bun 28; creat 1.04  12. Anemia of chronic renal disease stage 3: is stable hgb 9.1 received blood transfusion in hospital.   13. Coronary atherosclerosis of native coronary artery of native heart with other forms of angina pectoris status post STEMI and PCI: is stable; will continue coreg 6.25 mg twice daily asa 81 mg daily plavix 75 mg daily imdur 30 mg daily has prn ntg  14. Hypokalemia: is stable: will continue k+ 20 meq daily    MD is aware of resident's narcotic use and is in agreement with current plan of  care. We will attempt to wean resident as apropriate   Synthia Innocenteborah Green NP [facility]  Contact [phone] Monday through Friday 8am- 5pm  After hours call [phone]

## 2018-10-09 ENCOUNTER — Encounter: Payer: Self-pay | Admitting: Adult Health

## 2018-10-09 ENCOUNTER — Non-Acute Institutional Stay (SKILLED_NURSING_FACILITY): Payer: Medicare Other | Admitting: Adult Health

## 2018-10-09 DIAGNOSIS — I130 Hypertensive heart and chronic kidney disease with heart failure and stage 1 through stage 4 chronic kidney disease, or unspecified chronic kidney disease: Secondary | ICD-10-CM

## 2018-10-09 DIAGNOSIS — I5033 Acute on chronic diastolic (congestive) heart failure: Secondary | ICD-10-CM | POA: Diagnosis not present

## 2018-10-09 DIAGNOSIS — J449 Chronic obstructive pulmonary disease, unspecified: Secondary | ICD-10-CM | POA: Diagnosis not present

## 2018-10-09 DIAGNOSIS — N183 Chronic kidney disease, stage 3 (moderate): Secondary | ICD-10-CM

## 2018-10-09 DIAGNOSIS — M9712XS Periprosthetic fracture around internal prosthetic left knee joint, sequela: Secondary | ICD-10-CM | POA: Diagnosis not present

## 2018-10-09 NOTE — Progress Notes (Signed)
Location:   [place] [facility] Room Number: 204 A Place of Service:  SNF (31)    CODE STATUS: Full Code  No Known Allergies  Chief Complaint  Patient presents with  . Discharge Note    [facility]    HPI:  He is being [facility]. He will not need any dme of home health; these will be provided by the facility. He will follow up medically with the provider at the receiving facility. He had been hospitalized for a left femur fracture and pneumonia. He was admitted to this facility for short term rehab. He has completed therapy and the SNF level; however; he will need long term SNF.    Past Medical History:  Diagnosis Date  . CAD (coronary artery disease)   . Cervicalgia   . CHF (congestive heart failure) (HCC)   . COPD (chronic obstructive pulmonary disease) (HCC)   . Diastolic heart failure (HCC)   . Foot drop, right   . History of kidney stones   . Hyperlipidemia    unspecified  . Hypertension   . Myocardial infarction (HCC)   . Osteoarthritis   . Shoulder pain, left   . Sleep apnea   . Tremor, essential     Past Surgical History:  Procedure Laterality Date  . APPLICATION OF WOUND VAC Right 11/13/2017   Procedure: APPLICATION OF WOUND VAC;  Surgeon: Annice Needyew, Jason S, MD;  Location: [facility] ORS;  Service: Vascular;  Laterality: Right;  . BACK SURGERY  06/2010  . CARDIAC CATHETERIZATION Left 04/30/2016   Procedure: Left Heart Cath and Coronary Angiography;  Surgeon: Laurier NancyShaukat A Khan, MD;  Location: [facility] INVASIVE CV LAB;  Service: Cardiovascular;  Laterality: Left;  . COLONOSCOPY    . CORONARY ANGIOPLASTY    . CORONARY/GRAFT ACUTE MI REVASCULARIZATION N/A 06/21/2018   Procedure: Coronary/Graft Acute MI Revascularization;  Surgeon: Iran OuchArida, Muhammad A, MD;  Location: [facility] INVASIVE CV LAB;  Service: Cardiovascular;  Laterality: N/A;  . KNEE ARTHROPLASTY Left 06/01/2018   Procedure: COMPUTER ASSISTED TOTAL KNEE  ARTHROPLASTY;  Surgeon: Donato HeinzHooten, James P, MD;  Location: [facility] ORS;  Service: Orthopedics;  Laterality: Left;  . KNEE ARTHROSCOPY Left   . KNEE SURGERY Left 1998  . LEFT HEART CATH AND CORONARY ANGIOGRAPHY N/A 06/21/2018   Procedure: LEFT HEART CATH AND CORONARY ANGIOGRAPHY;  Surgeon: Iran OuchArida, Muhammad A, MD;  Location: [facility] INVASIVE CV LAB;  Service: Cardiovascular;  Laterality: N/A;  . ORIF FEMUR FRACTURE Left 08/27/2018   Procedure: OPEN REDUCTION INTERNAL FIXATION (ORIF) SUPRACONDYLAR FEMUR FRACTURE ABOVE PROSTHESIS, QUADRICEPS REPAIR;  Surgeon: Kennedy BuckerMenz, Michael, MD;  Location: [facility] ORS;  Service: Orthopedics;  Laterality: Left;  . TONSILLECTOMY    . WOUND DEBRIDEMENT Right 11/13/2017   Procedure: DEBRIDEMENT WOUND;  Surgeon: Annice Needyew, Jason S, MD;  Location: [facility] ORS;  Service: Vascular;  Laterality: Right;    Social History   Socioeconomic History  . Marital status: Married    Spouse name: Eber JonesCarolyn  . Number of children: 2  . Years of education: 1511  . Highest education level: 11th grade  Occupational History  . Occupation: retired  Engineer, productionocial Needs  . Financial resource strain: Not on file  . Food insecurity:    Worry: Not on file    Inability: Not on file  . Transportation needs:    Medical: Not on file    Non-medical: Not on file  Tobacco Use  . Smoking status: Former Smoker  Packs/day: 0.25    Years: 58.00    Pack years: 14.50    Types: Cigarettes  . Smokeless tobacco: Former Neurosurgeon  . Tobacco comment: quit the begginning of septembet 2019  Substance and Sexual Activity  . Alcohol use: No    Alcohol/week: 0.0 standard drinks  . Drug use: No  . Sexual activity: Not on file  Lifestyle  . Physical activity:    Days per week: Not on file    Minutes per session: Not on file  . Stress: Not on file  Relationships  . Social connections:    Talks on phone: Not on file    Gets together: Not on file    Attends religious service: Not on file    Active member of club or organization:  Not on file    Attends meetings of clubs or organizations: Not on file    Relationship status: Not on file  . Intimate partner violence:    Fear of current or ex partner: Not on file    Emotionally abused: Not on file    Physically abused: Not on file    Forced sexual activity: Not on file  Other Topics Concern  . Not on file  Social History Narrative   Full Code with HCPOA and living will   Married with 2 children   Former smoker   Alcohol - none   Smokeless tobacco - none   Family History  Problem Relation Age of Onset  . Diabetes Son   . Cancer Mother   . Colon cancer Mother   . Lung cancer Mother   . Tremor Father   . Heart disease Father   . Tremor Brother   . Bladder Cancer Brother   . Tremor Sister     VITAL SIGNS BP (!) 151/79   Pulse 78   Temp 98.9 F (37.2 C)   Resp 20   Ht 5\' 10"  (1.778 m)   Wt (!) 302 lb (137 kg)   SpO2 98%   BMI 43.33 kg/m   Patient's Medications  New Prescriptions   No medications on file  Previous Medications   ACETAMINOPHEN (TYLENOL) 500 MG TABLET    Take 1,000 mg by mouth 2 (two) times daily as needed for moderate pain.   ALUM & MAG HYDROXIDE-SIMETH (MAALOX PLUS) 400-400-40 MG/5ML SUSPENSION    Take 30 mLs by mouth every 4 (four) hours as needed for indigestion.   ASCORBIC ACID (VITAMIN C PO)    Take 1 tablet by mouth daily.   ASPIRIN EC 81 MG TABLET    Take 81 mg by mouth daily.   ATORVASTATIN (LIPITOR) 80 MG TABLET    Take 80 mg by mouth daily.   CARVEDILOL (COREG) 6.25 MG TABLET    Take 6.25 mg by mouth 2 (two) times daily with a meal.   CHOLECALCIFEROL 100 MCG (4000 UT) TABS    Take 1 tablet by mouth daily.   CLOPIDOGREL (PLAVIX) 75 MG TABLET    Take 75 mg by mouth daily.   COLLAGENASE (SANTYL) OINTMENT    Cleanse right first toe with normal saline,  Apply santyl nickel thick (1/4 inch)  to wound bed, cover with normal saline moist gauze and cover with Kerlix   DEXTROMETHORPHAN-GUAIFENESIN (MUCINEX DM) 30-600 MG 12HR TABLET     Take 1 tablet by mouth 2 (two) times daily as needed for cough.   GABAPENTIN (NEURONTIN) 400 MG CAPSULE    Take 400 mg by mouth at bedtime.  HYDROCODONE-ACETAMINOPHEN (NORCO/VICODIN) 5-325 MG TABLET    Take 1 tablet by mouth every 4 (four) hours as needed for moderate pain. For 7-10 / 10 pain   IPRATROPIUM-ALBUTEROL (DUONEB) 0.5-2.5 (3) MG/3ML SOLN    Take 3 mLs by nebulization every 6 (six) hours as needed (shortness of breath).    ISOSORBIDE MONONITRATE (IMDUR) 30 MG 24 HR TABLET    Take 30 mg by mouth daily.    KETOTIFEN (ZADITOR) 0.025 % OPHTHALMIC SOLUTION    Place 1 drop into both eyes 2 (two) times daily.   LORATADINE (CLARITIN) 10 MG TABLET    Take 10 mg by mouth daily as needed for allergies.   MELATONIN 3 MG TABS    Take 1 tablet by mouth at bedtime.   MULTIPLE VITAMINS-MINERALS (CENTROVITE) TABS    Take 1 tablet by mouth daily.   NITROGLYCERIN (NITROSTAT) 0.3 MG SL TABLET    Place 0.3 mg under the tongue every 5 (five) minutes as needed for chest pain.   NON FORMULARY    Diet type:  NAS   OXYGEN    Inhale 2 L/min into the lungs continuous.   POLYETHYLENE GLYCOL (MIRALAX / GLYCOLAX) PACKET    Take 17 g by mouth 2 (two) times daily.   POTASSIUM CHLORIDE (K-DUR,KLOR-CON) 10 MEQ TABLET    Take 10 mEq by mouth daily.    PRIMIDONE (MYSOLINE) 50 MG TABLET    Take 200 mg by mouth 2 (two) times daily.    SACUBITRIL-VALSARTAN (ENTRESTO) 24-26 MG    Take 1 tablet by mouth 2 (two) times daily.   SENNOSIDES-DOCUSATE SODIUM (SENOKOT-S) 8.6-50 MG TABLET    Take 2 tablets by mouth 2 (two) times daily.   SKIN PROTECTANTS, MISC. (ENDIT EX)    Apply to bottom and moisture associated breakdown of coccyx area BID and as need   SPIRONOLACTONE (ALDACTONE) 25 MG TABLET    Take 12.5 mg by mouth daily.   TAMSULOSIN (FLOMAX) 0.4 MG CAPS CAPSULE    Take 1 capsule (0.4 mg total) by mouth daily.   TORSEMIDE (DEMADEX) 20 MG TABLET    Take 40 mg by mouth daily.    UMECLIDINIUM-VILANTEROL (ANORO ELLIPTA) 62.5-25  MCG/INH AEPB    Inhale 1 puff into the lungs daily.  Modified Medications   No medications on file  Discontinued Medications   No medications on file     SIGNIFICANT DIAGNOSTIC EXAMS   PREVIOUS:   08-26-18: ct of head: No acute finding by CT. Atrophy and extensive chronic small-vessel ischemic changes. 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  08-26-18: lumbar spine x-ray: Single AP view of the lumbar spine shows normal spinal alignment.  08-26-18: left femur fracture: Comminuted displaced fracture of the distal femoral metaphysis above the femoral arthroplasty component.  08-26-18: left knee x-ray: Comminuted fracture distal femoral metaphysis above the femoral arthroplasty component  08-26-18: chest x-ray;  1. Retrocardiac airspace opacity with obscuration of the left costophrenic angle, not entirely specific but possibly from layering pleural effusion of passive atelectasis. 2.  Aortic Atherosclerosis  3. Mild enlargement of the cardiopericardial silhouette. No appreciable edema.  09-01-18: chest x-ray:  1. Slightly worsened bibasilar airspace opacities favored to represent a combination of layering pleural effusions with atelectasis and/or infiltrate. 2. Pulmonary vascular congestion bordering on mild edema.  09-01-18: renal ultrasound: No hydronephrosis is identified bilaterally. Right kidney cysts.  NO NEW EXAMS.   LABS REVIEWED PREVIOUS:   08-26-18: wbc 9.7; hgb 10.2; hct 32.5; mcv 99.7; plt 136; glucose 136; bun 38;  creat 1.95; k+ 3.9; na++ 139; ca 8.2 liver normal albumin 3.0; urine culture no growth;  08-28-28: wbc 7.3; hgb 7.2; hct 23.0; mcv 100;4; plt 114 glucose 132; bun 42; creat 1.93; k+ 4.0; na++ 139; ca 7.6 08-30-18: wbc 8.4; hgb 8.8; hct 28.0; mcv 97.2 plt 150; glucose 134; bun 37; creat 1.42; k+ 4.2; na++ 139; ca 8.2  09-02-18" wbc 9.1; hgb 8.5; hct 27.0; mcv 100.4; plt 209; glucose 128; bun 41; creat 1.47; k+ 3.3; na++ 140; ca 8.1 09-04-18: wbc 7.7; hgb 9.1; hct  290.; mcv 99.3; plt 218; glucose 105; bun 36; creat 1.28; k+ 3.7; na++ 141; ca 8. 09-19-18: glucose 98; bun 33; creat 1.18  k+ 3.5; na++ 140 ca 8.2  09-28-18: glucose 104; bun 28; creat 1.04 k+ 3.3; na++ 141; ca 8.3  NO NEW LABS.     Review of Systems  Constitutional: Negative for malaise/fatigue.  Respiratory: Negative for cough and shortness of breath.   Cardiovascular: Negative for chest pain, palpitations and leg swelling.  Gastrointestinal: Negative for abdominal pain, constipation and heartburn.  Musculoskeletal: Negative for back pain, joint pain and myalgias.  Skin: Negative.   Neurological: Negative for dizziness.  Psychiatric/Behavioral: The patient is not nervous/anxious.     Physical Exam Constitutional:      General: He is not in acute distress.    Appearance: He is well-developed. He is obese. He is not diaphoretic.  Neck:     Musculoskeletal: Neck supple.     Thyroid: No thyromegaly.  Cardiovascular:     Rate and Rhythm: Normal rate and regular rhythm.     Pulses: Normal pulses.     Heart sounds: Murmur present.     Comments: 1/6 Pulmonary:     Effort: Pulmonary effort is normal. No respiratory distress.     Breath sounds: Normal breath sounds.  Abdominal:     General: Bowel sounds are normal. There is no distension.     Palpations: Abdomen is soft.     Tenderness: There is no abdominal tenderness.  Musculoskeletal:     Right lower leg: No edema.     Left lower leg: No edema.     Comments:  Has left lower extremity immobilizer in place Is able to move other extremities  History of left knee replacement   Has limited range of motion in upper extremities       Lymphadenopathy:     Cervical: No cervical adenopathy.  Skin:    General: Skin is warm and dry.  Neurological:     Mental Status: He is alert and oriented to person, place, and time.  Psychiatric:        Mood and Affect: Mood normal.     ASSESSMENT/ PLAN:   Patient is being discharged with  the following home health services:  None   Patient is being discharged with the following durable medical equipment: none needed; will be provided by the receiving facility.     Patient has been advised to f/u with their PCP in 1-2 weeks to bring them up to date on their rehab stay.  Social services at facility was responsible for arranging this appointment.  Pt was provided with a 30 day supply of prescriptions for medications and refills must be obtained from their PCP.  For controlled substances, a more limited supply may be provided adequate until PCP appointment only.   All medications will be provided by the receiving facility    Synthia Innocenteborah Lebaron Bautch NP [facility]  Contact  [phone] Monday through Friday 8am- 5pm  After hours call [phone]

## 2018-10-13 ENCOUNTER — Non-Acute Institutional Stay (SKILLED_NURSING_FACILITY): Payer: Medicare Other | Admitting: Adult Health

## 2018-10-13 ENCOUNTER — Encounter: Payer: Self-pay | Admitting: Adult Health

## 2018-10-13 ENCOUNTER — Other Ambulatory Visit: Payer: Self-pay | Admitting: Adult Health

## 2018-10-13 DIAGNOSIS — M9712XS Periprosthetic fracture around internal prosthetic left knee joint, sequela: Secondary | ICD-10-CM

## 2018-10-13 MED ORDER — HYDROCODONE-ACETAMINOPHEN 5-325 MG PO TABS: 1.0000 | ORAL_TABLET | ORAL | 0 refills | Status: DC | PRN

## 2018-10-13 NOTE — Progress Notes (Signed)
Location:   [facility] Room Number: 204 [id number] Place of Service:  SNF (31)   CODE STATUS: Full Code  No Known Allergies  Chief Complaint  Patient presents with  . Acute Visit    Pain Management    HPI:  He has had a fall suffered to left femur fracture; is taking vicodin 5/325 mg every 4 hours as needed. He is participating in therapy. He is receiving pain relief at this time. He denies constipation; no insomnia; no changes in appetite. His weight is stable.  He has verbalized understanding of risk of addiction.   Past Medical History:  Diagnosis Date  . CAD (coronary artery disease)   . Cervicalgia   . CHF (congestive heart failure) (HCC)   . COPD (chronic obstructive pulmonary disease) (HCC)   . Diastolic heart failure (HCC)   . Foot drop, right   . History of kidney stones   . Hyperlipidemia    unspecified  . Hypertension   . Myocardial infarction (HCC)   . Osteoarthritis   . Shoulder pain, left   . Sleep apnea   . Tremor, essential     Past Surgical History:  Procedure Laterality Date  . APPLICATION OF WOUND VAC Right 11/13/2017   Procedure: APPLICATION OF WOUND VAC;  Surgeon: Annice Needyew, Jason S, MD;  Location: [facility] ORS;  Service: Vascular;  Laterality: Right;  . BACK SURGERY  06/2010  . CARDIAC CATHETERIZATION Left 04/30/2016   Procedure: Left Heart Cath and Coronary Angiography;  Surgeon: Laurier NancyShaukat A Khan, MD;  Location: [facility] INVASIVE CV LAB;  Service: Cardiovascular;  Laterality: Left;  . COLONOSCOPY    . CORONARY ANGIOPLASTY    . CORONARY/GRAFT ACUTE MI REVASCULARIZATION N/A 06/21/2018   Procedure: Coronary/Graft Acute MI Revascularization;  Surgeon: Iran OuchArida, Muhammad A, MD;  Location: [facility] INVASIVE CV LAB;  Service: Cardiovascular;  Laterality: N/A;  . KNEE ARTHROPLASTY Left 06/01/2018   Procedure: COMPUTER ASSISTED TOTAL KNEE ARTHROPLASTY;  Surgeon: Donato HeinzHooten, James P, MD;  Location: [facility] ORS;  Service: Orthopedics;  Laterality: Left;  . KNEE  ARTHROSCOPY Left   . KNEE SURGERY Left 1998  . LEFT HEART CATH AND CORONARY ANGIOGRAPHY N/A 06/21/2018   Procedure: LEFT HEART CATH AND CORONARY ANGIOGRAPHY;  Surgeon: Iran OuchArida, Muhammad A, MD;  Location: [facility] IN[facility] CV [facility];  Service: Cardiovascular;  Laterality: N/A;  . ORIF FEMUR FRACTURE Left 08/27/2018   Procedure: OPEN REDUCTION INTERNAL FIXATION (ORIF) SUPRACONDYLAR FEMUR FRACTURE ABOVE PROSTHESIS, QUADRICEPS REPAIR;  Surgeon: Kennedy BuckerMenz, Michael, MD;  Location: [facility] ORS;  Service: Orthopedics;  Laterality: Left;  . TONSILLECTOMY    . WOUND DEBRIDEMENT Right 11/13/2017   Procedure: DEBRIDEMENT WOUND;  Surgeon: Annice Needyew, Jason S, MD;  Location: [facility] ORS;  Service: Vascular;  Laterality: Right;    Social History   Socioeconomic History  . Marital status: Married    Spouse name: Eber JonesCarolyn  . Number of children: 2  . Years of education: 2411  . Highest education level: 11th grade  Occupational History  . Occupation: retired  Engineer, productionocial Needs  . Financial resource strain: Not on file  . Food insecurity:    Worry: Not on file    Inability: Not on file  . Transportation needs:    Medical: Not on file    Non-medical: Not on file  Tobacco Use  . Smoking status: Former Smoker    Packs/day: 0.25    Years: 58.00    Pack years: 14.50    Types: Cigarettes  . Smokeless tobacco: Former NeurosurgeonUser  .  Tobacco comment: quit the begginning of septembet 2019  Substance and Sexual Activity  . Alcohol use: No    Alcohol/week: 0.0 standard drinks  . Drug use: No  . Sexual activity: Not on file  Lifestyle  . Physical activity:    Days per week: Not on file    Minutes per session: Not on file  . Stress: Not on file  Relationships  . Social connections:    Talks on phone: Not on file    Gets together: Not on file    Attends religious service: Not on file    Active member of club or organization: Not on file    Attends meetings of clubs or organizations: Not on file    Relationship status: Not on file  .  Intimate partner violence:    Fear of current or ex partner: Not on file    Emotionally abused: Not on file    Physically abused: Not on file    Forced sexual activity: Not on file  Other Topics Concern  . Not on file  Social History Narrative   Full Code with HCPOA and living will   Married with 2 children   Former smoker   Alcohol - none   Smokeless tobacco - none   Family History  Problem Relation Age of Onset  . Diabetes Son   . Cancer Mother   . Colon cancer Mother   . Lung cancer Mother   . Tremor Father   . Heart disease Father   . Tremor Brother   . Bladder Cancer Brother   . Tremor Sister       VITAL SIGNS BP 115/72   Pulse 69   Temp 97.9 F (36.6 C)   Resp 18   Ht 5\' 10"  (1.778 m)   Wt (!) 302 lb 1.6 oz (137 kg)   SpO2 98%   BMI 43.35 kg/m   Outpatient Encounter Medications as of 10/13/2018  Medication Sig  . acetaminophen (TYLENOL) 500 MG tablet Take 1,000 mg by mouth 2 (two) times daily as needed for moderate pain.  . Ascorbic Acid (VITAMIN C PO) Take 1 tablet by mouth daily.  Marland Kitchen aspirin EC 81 MG tablet Take 81 mg by mouth daily.  Marland Kitchen atorvastatin (LIPITOR) 80 MG tablet Take 80 mg by mouth daily.  . carvedilol (COREG) 6.25 MG tablet Take 6.25 mg by mouth 2 (two) times daily with a meal.  . Cholecalciferol 100 MCG (4000 UT) TABS Take 1 tablet by mouth daily.  . clopidogrel (PLAVIX) 75 MG tablet Take 75 mg by mouth daily.  Marland Kitchen dextromethorphan-guaiFENesin (MUCINEX DM) 30-600 MG 12hr tablet Take 1 tablet by mouth 2 (two) times daily as needed for cough.  . gabapentin (NEURONTIN) 400 MG capsule Take 400 mg by mouth at bedtime.  Marland Kitchen HYDROcodone-acetaminophen (NORCO/VICODIN) 5-325 MG tablet Take 1 tablet by mouth every 4 (four) hours as needed for moderate pain. For 7-10 / 10 pain  . ipratropium-albuterol (DUONEB) 0.5-2.5 (3) MG/3ML SOLN Take 3 mLs by nebulization every 6 (six) hours as needed (shortness of breath).   . isosorbide mononitrate (IMDUR) 30 MG 24 hr  tablet Take 30 mg by mouth daily.   Marland Kitchen ketotifen (ZADITOR) 0.025 % ophthalmic solution Place 1 drop into both eyes 2 (two) times daily.  Marland Kitchen loratadine (CLARITIN) 10 MG tablet Take 10 mg by mouth daily as needed for allergies.  . Melatonin 3 MG TABS Take 1 tablet by mouth at bedtime.  . Multiple Vitamins-Minerals (CENTROVITE) TABS  Take 1 tablet by mouth daily.  . nitroGLYCERIN (NITROSTAT) 0.3 MG SL tablet Place 0.3 mg under the tongue every 5 (five) minutes as needed for chest pain.  . NON FORMULARY Diet type:  NAS  . OXYGEN Inhale 2 L/min into the lungs continuous.  . polyethylene glycol (MIRALAX / GLYCOLAX) packet Take 17 g by mouth daily as needed.   . potassium chloride (K-DUR,KLOR-CON) 10 MEQ tablet Take 10 mEq by mouth daily.   . primidone (MYSOLINE) 50 MG tablet Take 200 mg by mouth 2 (two) times daily.   . sacubitril-valsartan (ENTRESTO) 24-26 MG Take 1 tablet by mouth 2 (two) times daily.  . sennosides-docusate sodium (SENOKOT-S) 8.6-50 MG tablet Take 2 tablets by mouth 2 (two) times daily.  . Skin Protectants, Misc. (ENDIT EX) Apply to bottom and moisture associated breakdown of coccyx area BID and as need  . spironolactone (ALDACTONE) 25 MG tablet Take 12.5 mg by mouth daily.  . tamsulosin (FLOMAX) 0.4 MG CAPS capsule Take 1 capsule (0.4 mg total) by mouth daily.  Marland Kitchen. torsemide (DEMADEX) 20 MG tablet Take 40 mg by mouth daily.   Marland Kitchen. umeclidinium-vilanterol (ANORO ELLIPTA) 62.5-25 MCG/INH AEPB Inhale 1 puff into the lungs daily.  . Wound Dressings (ALLEVYN ADHESIVE) PADS Apply patch to right great toe - change every 3 days  . alum & mag hydroxide-simeth (MAALOX PLUS) 400-400-40 MG/5ML suspension Take 30 mLs by mouth every 4 (four) hours as needed for indigestion.  . [DISCONTINUED] collagenase (SANTYL) ointment Cleanse right first toe with normal saline,  Apply santyl nickel thick (1/4 inch)  to wound bed, cover with normal saline moist gauze and cover with Kerlix   No facility-administered  encounter medications on file as of 10/13/2018.      SIGNIFICANT DIAGNOSTIC EXAMS   PREVIOUS:   08-26-18: ct of head: No acute finding by CT. Atrophy and extensive chronic small-vessel ischemic changes. 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  08-26-18: lumbar spine x-ray: Single AP view of the lumbar spine shows normal spinal alignment.  08-26-18: left femur fracture: Comminuted displaced fracture of the distal femoral metaphysis above the femoral arthroplasty component.  08-26-18: left knee x-ray: Comminuted fracture distal femoral metaphysis above the femoral arthroplasty component  08-26-18: chest x-ray;  1. Retrocardiac airspace opacity with obscuration of the left costophrenic angle, not entirely specific but possibly from layering pleural effusion of passive atelectasis. 2.  Aortic Atherosclerosis  3. Mild enlargement of the cardiopericardial silhouette. No appreciable edema.  09-01-18: chest x-ray:  1. Slightly worsened bibasilar airspace opacities favored to represent a combination of layering pleural effusions with atelectasis and/or infiltrate. 2. Pulmonary vascular congestion bordering on mild edema.  09-01-18: renal ultrasound: No hydronephrosis is identified bilaterally. Right kidney cysts.  NO NEW EXAMS.   LABS REVIEWED PREVIOUS:   08-26-18: wbc 9.7; hgb 10.2; hct 32.5; mcv 99.7; plt 136; glucose 136; bun 38; creat 1.95; k+ 3.9; na++ 139; ca 8.2 liver normal albumin 3.0; urine culture no growth;  08-28-28: wbc 7.3; hgb 7.2; hct 23.0; mcv 100;4; plt 114 glucose 132; bun 42; creat 1.93; k+ 4.0; na++ 139; ca 7.6 08-30-18: wbc 8.4; hgb 8.8; hct 28.0; mcv 97.2 plt 150; glucose 134; bun 37; creat 1.42; k+ 4.2; na++ 139; ca 8.2  09-02-18" wbc 9.1; hgb 8.5; hct 27.0; mcv 100.4; plt 209; glucose 128; bun 41; creat 1.47; k+ 3.3; na++ 140; ca 8.1 09-04-18: wbc 7.7; hgb 9.1; hct 290.; mcv 99.3; plt 218; glucose 105; bun 36; creat 1.28; k+ 3.7; na++ 141;  ca 8. 09-19-18: glucose 98; bun  33; creat 1.18  k+ 3.5; na++ 140 ca 8.2  09-28-18: glucose 104; bun 28; creat 1.04 k+ 3.3; na++ 141; ca 8.3  NO NEW LABS.     Review of Systems  Constitutional: Negative for malaise/fatigue.  Respiratory: Negative for cough and shortness of breath.   Cardiovascular: Negative for chest pain, palpitations and leg swelling.  Gastrointestinal: Negative for abdominal pain, constipation and heartburn.  Musculoskeletal: Negative for back pain, joint pain and myalgias.  Skin: Negative.   Neurological: Negative for dizziness.  Psychiatric/Behavioral: The patient is not nervous/anxious.     Physical Exam Constitutional:      General: He is not in acute distress.    Appearance: He is well-developed. He is obese. He is not diaphoretic.  Neck:     Musculoskeletal: Neck supple.     Thyroid: No thyromegaly.  Cardiovascular:     Rate and Rhythm: Normal rate and regular rhythm.     Pulses: Normal pulses.     Heart sounds: Murmur present.     Comments: 1/6 Pulmonary:     Effort: Pulmonary effort is normal. No respiratory distress.     Breath sounds: Normal breath sounds.  Abdominal:     General: Bowel sounds are normal. There is no distension.     Palpations: Abdomen is soft.     Tenderness: There is no abdominal tenderness.  Musculoskeletal:     Right lower leg: No edema.     Left lower leg: No edema.     Comments: Is able to move other extremities  Status post left femur fracture  History of left knee replacement   Has limited range of motion in upper extremities     Lymphadenopathy:     Cervical: No cervical adenopathy.  Skin:    General: Skin is warm and dry.  Neurological:     Mental Status: He is alert and oriented to person, place, and time.  Psychiatric:        Mood and Affect: Mood normal.     ASSESSMENT/ PLAN:  TODAY;   1. Periprosthetic fracture around internal prosthetic left knee joint: is stable will continue vicodin 5/325 mg every 4 hours as needed through  10-20-18  MD is aware of resident's narcotic use and is in agreement with current plan of care. We will attempt to wean resident as apropriate   Synthia Innocent NP [facility]  Contact [phone] Monday through Friday 8am- 5pm  After hours call [phone]

## 2018-10-14 ENCOUNTER — Other Ambulatory Visit
Admission: RE | Admit: 2018-10-14 | Discharge: 2018-10-14 | Disposition: A | Discharge status: Home / Self Care | Payer: Medicare Other | Source: Ambulatory Visit | Attending: Adult Health | Admitting: Adult Health

## 2018-10-14 DIAGNOSIS — I130 Hypertensive heart and chronic kidney disease with heart failure and stage 1 through stage 4 chronic kidney disease, or unspecified chronic kidney disease: Secondary | ICD-10-CM | POA: Insufficient documentation

## 2018-10-15 ENCOUNTER — Other Ambulatory Visit
Admission: RE | Admit: 2018-10-15 | Discharge: 2018-10-15 | Disposition: A | Discharge status: Home / Self Care | Payer: Medicare Other | Source: Ambulatory Visit | Attending: Adult Health | Admitting: Adult Health

## 2018-10-15 DIAGNOSIS — I130 Hypertensive heart and chronic kidney disease with heart failure and stage 1 through stage 4 chronic kidney disease, or unspecified chronic kidney disease: Secondary | ICD-10-CM | POA: Diagnosis present

## 2018-10-15 LAB — BASIC METABOLIC PANEL
Anion gap: 6 (ref 5–15)
BUN: 28 mg/dL — ABNORMAL HIGH (ref 8–23)
CO2: 33 mmol/L — ABNORMAL HIGH (ref 22–32)
Calcium: 8.3 mg/dL — ABNORMAL LOW (ref 8.9–10.3)
Chloride: 103 mmol/L (ref 98–111)
Creatinine, Ser: 1.09 mg/dL (ref 0.61–1.24)
GFR calc Af Amer: >60 mL/min (ref >60)
GFR calc non Af Amer: >60 mL/min (ref >60)
GLUCOSE: 101 mg/dL — AB (ref 70–99)
Potassium: 3.2 mmol/L — ABNORMAL LOW (ref 3.5–5.1)
Sodium: 142 mmol/L (ref 135–145)

## 2018-10-19 ENCOUNTER — Other Ambulatory Visit
Admission: RE | Admit: 2018-10-19 | Discharge: 2018-10-19 | Disposition: A | Discharge status: Home / Self Care | Payer: Medicare Other | Source: Ambulatory Visit | Attending: Internal Medicine | Admitting: Internal Medicine

## 2018-10-19 DIAGNOSIS — E876 Hypokalemia: Secondary | ICD-10-CM | POA: Diagnosis present

## 2018-10-19 LAB — POTASSIUM: Potassium: 3.8 mmol/L (ref 3.5–5.1)

## 2018-10-20 ENCOUNTER — Encounter: Payer: Self-pay | Admitting: Adult Health

## 2018-10-20 ENCOUNTER — Non-Acute Institutional Stay (SKILLED_NURSING_FACILITY): Payer: Medicare Other | Admitting: Adult Health

## 2018-10-20 DIAGNOSIS — B372 Candidiasis of skin and nail: Secondary | ICD-10-CM | POA: Diagnosis not present

## 2018-10-20 DIAGNOSIS — Z720 Tobacco use: Secondary | ICD-10-CM | POA: Diagnosis not present

## 2018-10-20 DIAGNOSIS — F419 Anxiety disorder, unspecified: Secondary | ICD-10-CM

## 2018-10-20 DIAGNOSIS — E876 Hypokalemia: Secondary | ICD-10-CM | POA: Diagnosis not present

## 2018-10-20 NOTE — Progress Notes (Signed)
[facility]:  [facility] Room Number: 216-A Place of Service:  SNF ([phone]) Provider:  Kenard GowerMedina-Vargas, Sigrid Schwebach, NP  Patient Care Team: Jaclyn Shaggyate, [name], MD as PCP - General (Internal Medicine)  Extended Emergency Contact Information Primary Emergency Contact: Syfert,[name] Address: [address]          [place], [place] [address] [place] Home Phone: [phone] Mobile Phone: [phone] Relation: Spouse Secondary Emergency Contact: [name],[name]  [place] Home Phone: [phone] Mobile Phone: [phone] Relation: Son  Code Status:  Full Code  Goals of care: Advanced Directive information Advanced Directives 10/13/2018  Does Patient Have a Medical Advance Directive? No  Type of Advance Directive -  Does patient want to make changes to medical advance directive? No - Patient declined  Copy of Healthcare Power of Attorney in Chart? -  Would patient like information on creating a medical advance directive? No - Patient declined     Chief Complaint  Patient presents with  . Acute Visit    Patient with anxiety, possible UTI and yeast in groin    HPI:  Pt is an 83 y.o. male seen today for an acute visit due to anxiety, and wife stating he has yeast in his groin area and possible UTI.  He is a short-term rehab resident at [facility].  He has a PMH of neuropathy, atherosclerotic heart disease, CKD stage III, MI, obesity, and chronic systolic heart failure. Yesterday, he said that he was having a panic attack and was given Ativan 0.5 mg tab PO X 1. Later on at night, he tried to get out of bed without assistance and was given Xanax 0.5 mg PO X 1.He was seen today with PT and OT. He has diagnosis of tobacco abuse and was counseled. He was reported to have rashes on his bilateral groin. He verbalized having 0/10 pain.  He has been [facility] 09[facility]18 from a recent hospitalization due to a fall for which he sustained  fracture of left femur S/P ORIF. He had health care associated pneumonia with bilateral effusion and was given IV Zosyn and Lasix while in the hospital.  He, also, had Enterococcus faecalis and Klebsiella oxytoca UTI with multidrug-resistant.  He had postoperative anemia and was transfused with packed RBC.Marland Kitchen.   Past Medical History:  Diagnosis Date  . CAD (coronary artery disease)   . Cervicalgia   . CHF (congestive heart failure) (HCC)   . COPD (chronic obstructive pulmonary disease) (HCC)   . Diastolic heart failure (HCC)   . Foot drop, right   . History of kidney stones   . Hyperlipidemia    unspecified  . Hypertension   . Myocardial infarction (HCC)   . Osteoarthritis   . Shoulder pain, left   . Sleep apnea   . Tremor, essential    Past Surgical History:  Procedure Laterality Date  . APPLICATION OF WOUND VAC Right 11/13/2017   Procedure: APPLICATION OF WOUND VAC;  Surgeon: Annice Needyew, Jason S, MD;  Location: [facility] ORS;  Service: Vascular;  Laterality: Right;  . BACK SURGERY  06/2010  . CARDIAC CATHETERIZATION Left 04/30/2016   Procedure: Left Heart Cath and Coronary Angiography;  Surgeon: Laurier NancyShaukat A Khan, MD;  Location: [facility] INVASIVE CV LAB;  Service: Cardiovascular;  Laterality: Left;  . COLONOSCOPY    . CORONARY ANGIOPLASTY    . CORONARY/GRAFT ACUTE MI REVASCULARIZATION N/A 06/21/2018   Procedure: Coronary/Graft Acute MI Revascularization;  Surgeon: Lorine BearsArida, Muhammad  A, MD;  Location: [facility] INVASIVE CV LAB;  Service: Cardiovascular;  Laterality: N/A;  . KNEE ARTHROPLASTY Left 06/01/2018   Procedure: COMPUTER ASSISTED TOTAL KNEE ARTHROPLASTY;  Surgeon: Donato Heinz, MD;  Location: [facility] ORS;  Service: Orthopedics;  Laterality: Left;  . KNEE ARTHROSCOPY Left   . KNEE SURGERY Left 1998  . LEFT HEART CATH AND CORONARY ANGIOGRAPHY N/A 06/21/2018   Procedure: LEFT HEART CATH AND CORONARY ANGIOGRAPHY;  Surgeon: Iran Ouch, MD;  Location: [facility] INVASIVE CV LAB;  Service: Cardiovascular;   Laterality: N/A;  . ORIF FEMUR FRACTURE Left 08/27/2018   Procedure: OPEN REDUCTION INTERNAL FIXATION (ORIF) SUPRACONDYLAR FEMUR FRACTURE ABOVE PROSTHESIS, QUADRICEPS REPAIR;  Surgeon: Kennedy Bucker, MD;  Location: [facility] ORS;  Service: Orthopedics;  Laterality: Left;  . TONSILLECTOMY    . WOUND DEBRIDEMENT Right 11/13/2017   Procedure: DEBRIDEMENT WOUND;  Surgeon: Annice Needy, MD;  Location: [facility] ORS;  Service: Vascular;  Laterality: Right;    No Known Allergies  Outpatient Encounter Medications as of 10/20/2018  Medication Sig  . acetaminophen (TYLENOL) 500 MG tablet Take 1,000 mg by mouth 2 (two) times daily as needed for moderate pain.  Marland Kitchen alum & mag hydroxide-simeth (MAALOX PLUS) 400-400-40 MG/5ML suspension Take 30 mLs by mouth every 4 (four) hours as needed for indigestion.  . Ascorbic Acid (VITAMIN C PO) Take 500 mg by mouth daily.   Marland Kitchen aspirin EC 81 MG tablet Take 81 mg by mouth daily.  Marland Kitchen atorvastatin (LIPITOR) 80 MG tablet Take 80 mg by mouth daily.  . bisacodyl (DULCOLAX) 10 MG suppository Place 10 mg rectally as needed for moderate constipation.  . carvedilol (COREG) 6.25 MG tablet Take 6.25 mg by mouth 2 (two) times daily with a meal.  . Cholecalciferol 100 MCG (4000 UT) TABS Take 1 tablet by mouth daily.  . clopidogrel (PLAVIX) 75 MG tablet Take 75 mg by mouth daily.  Marland Kitchen dextromethorphan-guaiFENesin (MUCINEX DM) 30-600 MG 12hr tablet Take 1 tablet by mouth 2 (two) times daily as needed for cough.  . gabapentin (NEURONTIN) 400 MG capsule Take 400 mg by mouth at bedtime.  Marland Kitchen ipratropium-albuterol (DUONEB) 0.5-2.5 (3) MG/3ML SOLN Take 3 mLs by nebulization every 6 (six) hours as needed (shortness of breath).   . isosorbide mononitrate (IMDUR) 30 MG 24 hr tablet Take 30 mg by mouth daily.   Marland Kitchen ketotifen (ZADITOR) 0.025 % ophthalmic solution Place 1 drop into both eyes 2 (two) times daily.  Marland Kitchen loratadine (CLARITIN) 10 MG tablet Take 10 mg by mouth daily as needed for allergies.  .  magnesium hydroxide (MILK OF MAGNESIA) 400 MG/5ML suspension Take 30 mLs by mouth every 4 (four) hours as needed for mild constipation (No BM in 2 days).  . Melatonin 3 MG TABS Take 1 tablet by mouth at bedtime.  . Multiple Vitamins-Minerals (CENTROVITE) TABS Take 1 tablet by mouth daily.  . nitroGLYCERIN (NITROSTAT) 0.3 MG SL tablet Place 0.3 mg under the tongue every 5 (five) minutes as needed for chest pain.  . NON FORMULARY Diet type:  NAS  . OXYGEN Inhale 2 L/min into the lungs continuous.  . polyethylene glycol (MIRALAX / GLYCOLAX) packet Take 17 g by mouth daily as needed.   . potassium chloride (K-DUR,KLOR-CON) 10 MEQ tablet Take 10 mEq by mouth daily.   . primidone (MYSOLINE) 50 MG tablet Take 200 mg by mouth 2 (two) times daily.   . sacubitril-valsartan (ENTRESTO) 24-26 MG Take 1 tablet by mouth 2 (two) times daily.  . sennosides-docusate  sodium (SENOKOT-S) 8.6-50 MG tablet Take 2 tablets by mouth 2 (two) times daily.  . Skin Protectants, Misc. (ENDIT EX) Apply to bottom and moisture associated breakdown of coccyx area BID and as need  . spironolactone (ALDACTONE) 25 MG tablet Take 12.5 mg by mouth daily.   . tamsulosin (FLOMAX) 0.4 MG CAPS capsule Take 1 capsule (0.4 mg total) by mouth daily.  Marland Kitchen. torsemide (DEMADEX) 20 MG tablet Take 40 mg by mouth daily.   Marland Kitchen. umeclidinium-vilanterol (ANORO ELLIPTA) 62.5-25 MCG/INH AEPB Inhale 1 puff into the lungs daily.  . Wound Dressings (ALLEVYN ADHESIVE) PADS Apply patch to right great toe - change every 3 days  . [DISCONTINUED] HYDROcodone-acetaminophen (NORCO/VICODIN) 5-325 MG tablet Take 1 tablet by mouth every 4 (four) hours as needed for up to 7 days for moderate pain. For 7-10 / 10 pain   No facility-administered encounter medications on file as of 10/20/2018.     Review of Systems  GENERAL: No change in appetite, no fatigue, no weight changes, no fever, chills or weakness MOUTH and THROAT: Denies oral discomfort, gingival pain or  bleeding, pain from teeth or hoarseness   RESPIRATORY: no cough, SOB, DOE, wheezing, hemoptysis CARDIAC: No chest pain, edema or palpitations GI: No abdominal pain, diarrhea, constipation, heart burn, nausea or vomiting GU: Denies dysuria, frequency, hematuria, incontinence, or discharge NEUROLOGICAL: Denies dizziness, syncope, numbness, or headache PSYCHIATRIC: was restless and tried to get out of bed last night    Immunization History  Administered Date(s) Administered  . Influenza, High Dose Seasonal PF 06/03/2018  . Influenza,inj,Quad PF,6+ Mos 06/11/2016   Pertinent  Health Maintenance Due  Topic Date Due  . PNA vac Low Risk Adult (1 of 2 - PCV13) 11/30/2018 (Originally 09/14/2002)  . INFLUENZA VACCINE  Completed    Vitals:   10/20/18 0949  Resp: 20  Temp: 98.4 F (36.9 C)  TempSrc: Oral  SpO2: 98%  Weight: (!) 305 lb 14.4 oz (138.8 kg)  Height: 5\' 10"  (1.778 m)   Body mass index is 43.89 kg/m.  Physical Exam  GENERAL APPEARANCE: Well nourished. In no acute distress. Morbidly obese SKIN:  Bruising on bilateral arms (possible IV sites) MOUTH and THROAT: Lips are without lesions. Oral mucosa is moist and without lesions. Tongue is normal in shape, size, and color and without lesions NECK: supple, trachea midline, no neck masses RESPIRATORY: Breathing is even & unlabored, BS CTAB CARDIAC: RRR, no murmur,no extra heart sounds, no edema GI: Abdomen soft, normal BS, no masses, no tenderness NEUROLOGICAL: Left hand tremors tremor. Speech is clear. He is alert to self, disoriented to time and place EXTREMITIES:  Has left leg immobilizer PSYCHIATRIC:  Affect and behavior are appropriate   Labs reviewed: Recent Labs    12/04/17 0613  09/19/18 0530 09/28/18 0550 10/15/18 0638 10/19/18 0545  NA 139   < > 140 141 142  --   K 3.3*   < > 3.5 3.3* 3.2* 3.8  CL 104   < > 102 103 103  --   CO2 26   < > 30 31 33*  --   GLUCOSE 101*   < > 98 104* 101*  --   BUN 47*   < >  33* 28* 28*  --   CREATININE 1.66*   < > 1.18 1.04 1.09  --   CALCIUM 7.9*   < > 8.2* 8.3* 8.3*  --   MG 2.0  --   --   --   --   --    < > =  values in this interval not displayed.   Recent Labs    05/20/18 1001 06/21/18 0602 08/26/18 0821  AST 38 31 38  ALT 44 24 37  ALKPHOS 105 97 73  BILITOT 1.0 0.7 0.9  PROT 6.8 6.7 5.7*  ALBUMIN 3.4* 3.3* 3.0*   Recent Labs    05/27/18 1152 06/21/18 0602  08/26/18 0821  09/01/18 0306 09/02/18 0254 09/03/18 1020  WBC 9.0 8.8   < > 9.7   < > 10.1 9.1 7.7  NEUTROABS 5.6 5.6  --  7.6  --   --   --   --   HGB 13.4 11.9*   < > 10.2*   < > 9.2* 8.5* 9.1*  HCT 39.4* 37.1*   < > 32.5*   < > 29.5* 27.0* 29.0*  MCV 95.8 100.0   < > 99.7   < > 100.7* 100.4* 99.3  PLT 143* 226   < > 136*   < > 171 209 218   < > = values in this interval not displayed.   Lab Results  Component Value Date   TSH 3.782 08/31/2017   Lab Results  Component Value Date   HGBA1C 5.8 (H) 05/20/2018   Lab Results  Component Value Date   CHOL 100 09/08/2017   HDL 34 (L) 09/08/2017   LDLCALC 54 09/08/2017   TRIG 61 09/08/2017   CHOLHDL 2.9 09/08/2017     Assessment/Plan  1. Anxiety - had panic attack yesterday and was given Ativan 0.5 mg PO X 1, got restless last night and was given Xanax 0.5 mg X 1, he appears to be calm at this time, will check for UTI  2. Tobacco abuse -  Will start nicotine 21 mg / 24-hour 1 patch transdermally to skin daily x6 weeks then 14 mg / 24-hour daily x2 weeks then 7 mg / 24-hour daily x2 weeks then discontinue, counseled  3. Hypokalemia Lab Results  Component Value Date   K 3.8 10/19/2018  -KCl ER 20 meq 1 tab twice a day   4.  Candida, skin  -Has bilateral groin rashes, will start nystatin ointment to rashes on bilateral groin 4 times daily times, keep skin clean and dry     Family/ staff Communication: Discussed plan of care with resident.  Labs/tests ordered:   Urinalysis with culture and sensitivity  Goals of  care:   Short-term rehabilitation.   Kenard Gower, NP [facility] [phone] (Monday-Friday 8:00 a.m. - 5:00 p.m.) [phone] (after hours)

## 2018-10-21 ENCOUNTER — Other Ambulatory Visit
Admission: RE | Admit: 2018-10-21 | Discharge: 2018-10-21 | Disposition: A | Discharge status: Home / Self Care | Payer: Medicare Other | Source: Ambulatory Visit | Attending: Adult Health | Admitting: Adult Health

## 2018-10-21 DIAGNOSIS — R451 Restlessness and agitation: Secondary | ICD-10-CM | POA: Diagnosis present

## 2018-10-21 LAB — URINALYSIS, ROUTINE W REFLEX MICROSCOPIC
Bacteria, UA: NONE SEEN
Bilirubin Urine: NEGATIVE
Glucose, UA: NEGATIVE mg/dL
Hgb urine dipstick: NEGATIVE
Ketones, ur: NEGATIVE mg/dL
Leukocytes,Ua: NEGATIVE
Nitrite: NEGATIVE
PROTEIN: 30 mg/dL — AB
SQUAMOUS EPITHELIAL / LPF: NONE SEEN (ref 0–5)
Specific Gravity, Urine: 1.013 (ref 1.005–1.030)
pH: 5 (ref 5.0–8.0)

## 2018-10-22 ENCOUNTER — Other Ambulatory Visit
Admission: RE | Admit: 2018-10-22 | Discharge: 2018-10-22 | Disposition: A | Discharge status: Home / Self Care | Payer: Medicare Other | Source: Skilled Nursing Facility | Attending: Internal Medicine | Admitting: Internal Medicine

## 2018-10-22 DIAGNOSIS — R410 Disorientation, unspecified: Secondary | ICD-10-CM | POA: Insufficient documentation

## 2018-10-22 DIAGNOSIS — R451 Restlessness and agitation: Secondary | ICD-10-CM | POA: Diagnosis present

## 2018-10-22 LAB — URINE CULTURE

## 2018-10-22 LAB — URINALYSIS, ROUTINE W REFLEX MICROSCOPIC
Bilirubin Urine: NEGATIVE
Glucose, UA: NEGATIVE mg/dL
HGB URINE DIPSTICK: NEGATIVE
Ketones, ur: NEGATIVE mg/dL
Leukocytes,Ua: NEGATIVE
Nitrite: NEGATIVE
Protein, ur: NEGATIVE mg/dL
SPECIFIC GRAVITY, URINE: 1.012 (ref 1.005–1.030)
pH: 6 (ref 5.0–8.0)

## 2018-10-23 LAB — URINE CULTURE

## 2018-10-26 ENCOUNTER — Encounter: Payer: Self-pay | Admitting: Adult Health

## 2018-10-26 NOTE — Progress Notes (Signed)
This encounter was created in error - please disregard.

## 2018-10-27 ENCOUNTER — Inpatient Hospital Stay
Admission: EM | Admit: 2018-10-27 | Discharge: 2018-10-31 | DRG: 291 | Disposition: A | Discharge status: Home Health | Payer: Medicare Other | Source: Skilled Nursing Facility | Attending: Family Medicine | Admitting: Family Medicine

## 2018-10-27 ENCOUNTER — Non-Acute Institutional Stay (SKILLED_NURSING_FACILITY): Payer: Medicare Other | Admitting: Adult Health

## 2018-10-27 ENCOUNTER — Other Ambulatory Visit: Payer: Self-pay

## 2018-10-27 ENCOUNTER — Encounter: Payer: Self-pay | Admitting: Adult Health

## 2018-10-27 ENCOUNTER — Emergency Department: Payer: Medicare Other

## 2018-10-27 ENCOUNTER — Other Ambulatory Visit
Admission: RE | Admit: 2018-10-27 | Discharge: 2018-10-27 | Disposition: A | Discharge status: Home / Self Care | Payer: Medicare Other | Source: Ambulatory Visit | Attending: Internal Medicine | Admitting: Internal Medicine

## 2018-10-27 DIAGNOSIS — Z955 Presence of coronary angioplasty implant and graft: Secondary | ICD-10-CM

## 2018-10-27 DIAGNOSIS — I5022 Chronic systolic (congestive) heart failure: Secondary | ICD-10-CM

## 2018-10-27 DIAGNOSIS — I5023 Acute on chronic systolic (congestive) heart failure: Secondary | ICD-10-CM

## 2018-10-27 DIAGNOSIS — M21371 Foot drop, right foot: Secondary | ICD-10-CM | POA: Diagnosis present

## 2018-10-27 DIAGNOSIS — I2510 Atherosclerotic heart disease of native coronary artery without angina pectoris: Secondary | ICD-10-CM | POA: Diagnosis present

## 2018-10-27 DIAGNOSIS — J189 Pneumonia, unspecified organism: Secondary | ICD-10-CM

## 2018-10-27 DIAGNOSIS — Z96652 Presence of left artificial knee joint: Secondary | ICD-10-CM | POA: Diagnosis present

## 2018-10-27 DIAGNOSIS — E669 Obesity, unspecified: Secondary | ICD-10-CM | POA: Diagnosis present

## 2018-10-27 DIAGNOSIS — I5043 Acute on chronic combined systolic (congestive) and diastolic (congestive) heart failure: Secondary | ICD-10-CM | POA: Diagnosis present

## 2018-10-27 DIAGNOSIS — Z6837 Body mass index (BMI) 37.0-37.9, adult: Secondary | ICD-10-CM

## 2018-10-27 DIAGNOSIS — R0602 Shortness of breath: Secondary | ICD-10-CM | POA: Insufficient documentation

## 2018-10-27 DIAGNOSIS — J90 Pleural effusion, not elsewhere classified: Secondary | ICD-10-CM

## 2018-10-27 DIAGNOSIS — I252 Old myocardial infarction: Secondary | ICD-10-CM

## 2018-10-27 DIAGNOSIS — J9601 Acute respiratory failure with hypoxia: Secondary | ICD-10-CM | POA: Diagnosis present

## 2018-10-27 DIAGNOSIS — G629 Polyneuropathy, unspecified: Secondary | ICD-10-CM | POA: Diagnosis present

## 2018-10-27 DIAGNOSIS — J440 Chronic obstructive pulmonary disease with acute lower respiratory infection: Secondary | ICD-10-CM | POA: Diagnosis present

## 2018-10-27 DIAGNOSIS — N400 Enlarged prostate without lower urinary tract symptoms: Secondary | ICD-10-CM | POA: Diagnosis present

## 2018-10-27 DIAGNOSIS — Z8249 Family history of ischemic heart disease and other diseases of the circulatory system: Secondary | ICD-10-CM | POA: Diagnosis not present

## 2018-10-27 DIAGNOSIS — Z79899 Other long term (current) drug therapy: Secondary | ICD-10-CM | POA: Diagnosis not present

## 2018-10-27 DIAGNOSIS — Z87891 Personal history of nicotine dependence: Secondary | ICD-10-CM | POA: Diagnosis not present

## 2018-10-27 DIAGNOSIS — Z7951 Long term (current) use of inhaled steroids: Secondary | ICD-10-CM | POA: Diagnosis not present

## 2018-10-27 DIAGNOSIS — G4733 Obstructive sleep apnea (adult) (pediatric): Secondary | ICD-10-CM | POA: Diagnosis present

## 2018-10-27 DIAGNOSIS — I509 Heart failure, unspecified: Secondary | ICD-10-CM

## 2018-10-27 DIAGNOSIS — Y95 Nosocomial condition: Secondary | ICD-10-CM | POA: Diagnosis present

## 2018-10-27 DIAGNOSIS — Z7902 Long term (current) use of antithrombotics/antiplatelets: Secondary | ICD-10-CM | POA: Diagnosis not present

## 2018-10-27 DIAGNOSIS — I110 Hypertensive heart disease with heart failure: Secondary | ICD-10-CM | POA: Diagnosis present

## 2018-10-27 DIAGNOSIS — E785 Hyperlipidemia, unspecified: Secondary | ICD-10-CM | POA: Diagnosis present

## 2018-10-27 DIAGNOSIS — R0600 Dyspnea, unspecified: Secondary | ICD-10-CM

## 2018-10-27 DIAGNOSIS — G250 Essential tremor: Secondary | ICD-10-CM | POA: Diagnosis present

## 2018-10-27 DIAGNOSIS — Z7982 Long term (current) use of aspirin: Secondary | ICD-10-CM | POA: Diagnosis not present

## 2018-10-27 LAB — CBC WITH DIFFERENTIAL/PLATELET
ABS IMMATURE GRANULOCYTES: 0.06 10*3/uL (ref 0.00–0.07)
Basophils Absolute: 0 10*3/uL (ref 0.0–0.1)
Basophils Relative: 1 %
Eosinophils Absolute: 0.2 10*3/uL (ref 0.0–0.5)
Eosinophils Relative: 2 %
HCT: 34.4 % — ABNORMAL LOW (ref 39.0–52.0)
Hemoglobin: 10.8 g/dL — ABNORMAL LOW (ref 13.0–17.0)
Immature Granulocytes: 1 %
LYMPHS ABS: 1.3 10*3/uL (ref 0.7–4.0)
Lymphocytes Relative: 17 %
MCH: 30.8 pg (ref 26.0–34.0)
MCHC: 31.4 g/dL (ref 30.0–36.0)
MCV: 98 fL (ref 80.0–100.0)
Monocytes Absolute: 0.7 10*3/uL (ref 0.1–1.0)
Monocytes Relative: 9 %
Neutro Abs: 5.4 10*3/uL (ref 1.7–7.7)
Neutrophils Relative %: 70 %
Platelets: 202 10*3/uL (ref 150–400)
RBC: 3.51 MIL/uL — ABNORMAL LOW (ref 4.22–5.81)
RDW: 15.8 % — ABNORMAL HIGH (ref 11.5–15.5)
WBC: 7.6 10*3/uL (ref 4.0–10.5)
nRBC: 0 % (ref 0.0–0.2)

## 2018-10-27 LAB — BRAIN NATRIURETIC PEPTIDE
B Natriuretic Peptide: 2293 pg/mL — ABNORMAL HIGH (ref 0.0–100.0)
B Natriuretic Peptide: 2327 pg/mL — ABNORMAL HIGH (ref 0.0–100.0)

## 2018-10-27 LAB — BASIC METABOLIC PANEL
ANION GAP: 9 (ref 5–15)
ANION GAP: 10 (ref 5–15)
BUN: 26 mg/dL — ABNORMAL HIGH (ref 8–23)
BUN: 25 mg/dL — ABNORMAL HIGH (ref 8–23)
CO2: 31 mmol/L (ref 22–32)
CO2: 33 mmol/L — ABNORMAL HIGH (ref 22–32)
Calcium: 8.6 mg/dL — ABNORMAL LOW (ref 8.9–10.3)
Calcium: 8.9 mg/dL (ref 8.9–10.3)
Chloride: 103 mmol/L (ref 98–111)
Chloride: 102 mmol/L (ref 98–111)
Creatinine, Ser: 1.09 mg/dL (ref 0.61–1.24)
Creatinine, Ser: 0.98 mg/dL (ref 0.61–1.24)
GFR calc Af Amer: >60 mL/min (ref >60)
GFR calc Af Amer: >60 mL/min (ref >60)
GFR calc non Af Amer: >60 mL/min (ref >60)
GFR calc non Af Amer: >60 mL/min (ref >60)
Glucose, Bld: 119 mg/dL — ABNORMAL HIGH (ref 70–99)
Glucose, Bld: 122 mg/dL — ABNORMAL HIGH (ref 70–99)
Potassium: 3.7 mmol/L (ref 3.5–5.1)
Potassium: 3.8 mmol/L (ref 3.5–5.1)
Sodium: 143 mmol/L (ref 135–145)
Sodium: 145 mmol/L (ref 135–145)

## 2018-10-27 LAB — INFLUENZA PANEL BY PCR (TYPE A & B)
INFLBPCR: NEGATIVE
Influenza A By PCR: NEGATIVE

## 2018-10-27 MED ORDER — VANCOMYCIN HCL 10 G IV SOLR
2000.0000 mg | INTRAVENOUS | Status: DC
  Filled 2018-10-27: qty 2000

## 2018-10-27 MED ORDER — ALUM & MAG HYDROXIDE-SIMETH 200-200-20 MG/5ML PO SUSP: 30.0000 mL | ORAL | Status: DC | PRN

## 2018-10-27 MED ORDER — ISOSORBIDE MONONITRATE ER 30 MG PO TB24
30.0000 mg | ORAL_TABLET | Freq: Every day | ORAL | Status: DC
  Administered 2018-10-28 – 2018-10-31 (×4): 30 mg via ORAL
  Filled 2018-10-27 (×4): qty 1

## 2018-10-27 MED ORDER — CARVEDILOL 3.125 MG PO TABS
6.2500 mg | ORAL_TABLET | Freq: Two times a day (BID) | ORAL | Status: DC
  Administered 2018-10-28 – 2018-10-31 (×7): 6.25 mg via ORAL
  Filled 2018-10-27 (×7): qty 2

## 2018-10-27 MED ORDER — VANCOMYCIN HCL IN DEXTROSE 1-5 GM/200ML-% IV SOLN
1000.0000 mg | Freq: Once | INTRAVENOUS | Status: AC
  Administered 2018-10-27: 1000 mg via INTRAVENOUS
  Filled 2018-10-27: qty 200

## 2018-10-27 MED ORDER — GUAIFENESIN ER 600 MG PO TB12: 600.0000 mg | ORAL_TABLET | Freq: Two times a day (BID) | ORAL | Status: DC | PRN

## 2018-10-27 MED ORDER — MAGNESIUM HYDROXIDE 400 MG/5ML PO SUSP
30.0000 mL | ORAL | Status: DC | PRN
  Filled 2018-10-27: qty 30

## 2018-10-27 MED ORDER — NYSTATIN 100000 UNIT/GM EX OINT
1.0000 "application " | TOPICAL_OINTMENT | Freq: Four times a day (QID) | CUTANEOUS | Status: DC
  Administered 2018-10-28 – 2018-10-31 (×12): 1 via TOPICAL
  Filled 2018-10-27: qty 15

## 2018-10-27 MED ORDER — TAMSULOSIN HCL 0.4 MG PO CAPS
0.4000 mg | ORAL_CAPSULE | Freq: Every day | ORAL | Status: DC
  Administered 2018-10-28 – 2018-10-31 (×4): 0.4 mg via ORAL
  Filled 2018-10-27 (×4): qty 1

## 2018-10-27 MED ORDER — SODIUM CHLORIDE 0.9 % IV SOLN
2.0000 g | Freq: Once | INTRAVENOUS | Status: AC
  Administered 2018-10-27: 2 g via INTRAVENOUS
  Filled 2018-10-27: qty 2

## 2018-10-27 MED ORDER — DM-GUAIFENESIN ER 30-600 MG PO TB12: 1.0000 | ORAL_TABLET | Freq: Two times a day (BID) | ORAL | Status: DC | PRN

## 2018-10-27 MED ORDER — SENNOSIDES-DOCUSATE SODIUM 8.6-50 MG PO TABS: 2.0000 | ORAL_TABLET | Freq: Two times a day (BID) | ORAL | Status: DC | PRN

## 2018-10-27 MED ORDER — UMECLIDINIUM-VILANTEROL 62.5-25 MCG/INH IN AEPB
1.0000 | INHALATION_SPRAY | Freq: Every day | RESPIRATORY_TRACT | Status: DC
  Administered 2018-10-28 – 2018-10-31 (×4): 1 via RESPIRATORY_TRACT
  Filled 2018-10-27: qty 14

## 2018-10-27 MED ORDER — DOCUSATE SODIUM 100 MG PO CAPS: 100.0000 mg | ORAL_CAPSULE | Freq: Two times a day (BID) | ORAL | Status: DC | PRN

## 2018-10-27 MED ORDER — SPIRONOLACTONE 25 MG PO TABS
12.5000 mg | ORAL_TABLET | Freq: Every day | ORAL | Status: DC
  Administered 2018-10-28 – 2018-10-31 (×4): 12.5 mg via ORAL
  Filled 2018-10-27: qty 0.5
  Filled 2018-10-27 (×2): qty 1
  Filled 2018-10-27 (×3): qty 0.5
  Filled 2018-10-27: qty 1

## 2018-10-27 MED ORDER — ATORVASTATIN CALCIUM 20 MG PO TABS
80.0000 mg | ORAL_TABLET | Freq: Every day | ORAL | Status: DC
  Administered 2018-10-28 – 2018-10-30 (×3): 80 mg via ORAL
  Filled 2018-10-27 (×3): qty 4

## 2018-10-27 MED ORDER — VANCOMYCIN HCL 10 G IV SOLR
1500.0000 mg | Freq: Once | INTRAVENOUS | Status: AC
  Administered 2018-10-27: 23:00:00 1500 mg via INTRAVENOUS
  Filled 2018-10-27 (×2): qty 1500

## 2018-10-27 MED ORDER — BISACODYL 10 MG RE SUPP: 10.0000 mg | RECTAL | Status: DC | PRN

## 2018-10-27 MED ORDER — ASPIRIN EC 81 MG PO TBEC
81.0000 mg | DELAYED_RELEASE_TABLET | Freq: Every day | ORAL | Status: DC
  Administered 2018-10-28 – 2018-10-31 (×4): 81 mg via ORAL
  Filled 2018-10-27 (×4): qty 1

## 2018-10-27 MED ORDER — ADULT MULTIVITAMIN W/MINERALS CH
1.0000 | ORAL_TABLET | Freq: Every day | ORAL | Status: DC
  Administered 2018-10-28 – 2018-10-31 (×4): 1 via ORAL
  Filled 2018-10-27 (×4): qty 1

## 2018-10-27 MED ORDER — FUROSEMIDE 10 MG/ML IJ SOLN
20.0000 mg | Freq: Three times a day (TID) | INTRAMUSCULAR | Status: DC
  Administered 2018-10-27 – 2018-10-29 (×6): 20 mg via INTRAVENOUS
  Filled 2018-10-27 (×6): qty 2

## 2018-10-27 MED ORDER — IPRATROPIUM-ALBUTEROL 0.5-2.5 (3) MG/3ML IN SOLN
3.0000 mL | Freq: Four times a day (QID) | RESPIRATORY_TRACT | Status: DC | PRN
  Administered 2018-10-28: 05:00:00 3 mL via RESPIRATORY_TRACT
  Filled 2018-10-27: qty 3

## 2018-10-27 MED ORDER — FUROSEMIDE 10 MG/ML IJ SOLN
40.0000 mg | Freq: Once | INTRAMUSCULAR | Status: AC
  Administered 2018-10-27: 40 mg via INTRAVENOUS
  Filled 2018-10-27: qty 4

## 2018-10-27 MED ORDER — SACUBITRIL-VALSARTAN 24-26 MG PO TABS
1.0000 | ORAL_TABLET | Freq: Two times a day (BID) | ORAL | Status: DC
  Administered 2018-10-27 – 2018-10-31 (×8): 1 via ORAL
  Filled 2018-10-27 (×9): qty 1

## 2018-10-27 MED ORDER — VITAMIN D 25 MCG (1000 UNIT) PO TABS
1000.0000 [IU] | ORAL_TABLET | Freq: Every day | ORAL | Status: DC
  Administered 2018-10-28 – 2018-10-31 (×4): 1000 [IU] via ORAL
  Filled 2018-10-27 (×4): qty 1

## 2018-10-27 MED ORDER — CLOPIDOGREL BISULFATE 75 MG PO TABS
75.0000 mg | ORAL_TABLET | Freq: Every day | ORAL | Status: DC
  Administered 2018-10-28 – 2018-10-31 (×4): 75 mg via ORAL
  Filled 2018-10-27 (×4): qty 1

## 2018-10-27 MED ORDER — SODIUM CHLORIDE 0.9 % IV SOLN
2.0000 g | Freq: Three times a day (TID) | INTRAVENOUS | Status: DC
  Administered 2018-10-28: 02:00:00 2 g via INTRAVENOUS
  Filled 2018-10-27 (×3): qty 2

## 2018-10-27 MED ORDER — GABAPENTIN 300 MG PO CAPS
400.0000 mg | ORAL_CAPSULE | Freq: Every day | ORAL | Status: DC
  Administered 2018-10-27 – 2018-10-30 (×4): 400 mg via ORAL
  Filled 2018-10-27 (×4): qty 1

## 2018-10-27 MED ORDER — LORATADINE 10 MG PO TABS: 10.0000 mg | ORAL_TABLET | Freq: Every day | ORAL | Status: DC | PRN

## 2018-10-27 MED ORDER — VITAMIN C 500 MG PO TABS
500.0000 mg | ORAL_TABLET | Freq: Every day | ORAL | Status: DC
  Administered 2018-10-28 – 2018-10-31 (×4): 500 mg via ORAL
  Filled 2018-10-27 (×4): qty 1

## 2018-10-27 MED ORDER — PRIMIDONE 50 MG PO TABS
200.0000 mg | ORAL_TABLET | Freq: Two times a day (BID) | ORAL | Status: DC
  Administered 2018-10-27 – 2018-10-31 (×8): 200 mg via ORAL
  Filled 2018-10-27 (×9): qty 4

## 2018-10-27 MED ORDER — HEPARIN SODIUM (PORCINE) 5000 UNIT/ML IJ SOLN
5000.0000 [IU] | Freq: Three times a day (TID) | INTRAMUSCULAR | Status: DC
  Administered 2018-10-27 – 2018-10-31 (×6): 5000 [IU] via SUBCUTANEOUS
  Filled 2018-10-27 (×10): qty 1

## 2018-10-27 MED ORDER — DEXTROMETHORPHAN POLISTIREX ER 30 MG/5ML PO SUER
30.0000 mg | Freq: Two times a day (BID) | ORAL | Status: DC | PRN
  Filled 2018-10-27: qty 5

## 2018-10-27 MED ORDER — POLYETHYLENE GLYCOL 3350 17 G PO PACK: 17.0000 g | PACK | Freq: Two times a day (BID) | ORAL | Status: DC | PRN

## 2018-10-27 MED ORDER — SACCHAROMYCES BOULARDII 250 MG PO CAPS
250.0000 mg | ORAL_CAPSULE | Freq: Two times a day (BID) | ORAL | Status: DC
  Administered 2018-10-27 – 2018-10-31 (×8): 250 mg via ORAL
  Filled 2018-10-27 (×8): qty 1

## 2018-10-27 MED ORDER — ACETAMINOPHEN 500 MG PO TABS
1000.0000 mg | ORAL_TABLET | Freq: Two times a day (BID) | ORAL | Status: DC | PRN
  Administered 2018-10-28: 1000 mg via ORAL
  Filled 2018-10-27: qty 2

## 2018-10-27 MED ORDER — KETOTIFEN FUMARATE 0.025 % OP SOLN
1.0000 [drp] | Freq: Two times a day (BID) | OPHTHALMIC | Status: DC
  Administered 2018-10-28 – 2018-10-31 (×7): 1 [drp] via OPHTHALMIC
  Filled 2018-10-27: qty 5

## 2018-10-27 MED ORDER — POTASSIUM CHLORIDE CRYS ER 20 MEQ PO TBCR
20.0000 meq | EXTENDED_RELEASE_TABLET | Freq: Two times a day (BID) | ORAL | Status: DC
  Administered 2018-10-27 – 2018-10-31 (×8): 20 meq via ORAL
  Filled 2018-10-27 (×8): qty 1

## 2018-10-27 MED ORDER — MELATONIN 5 MG PO TABS
5.0000 mg | ORAL_TABLET | Freq: Every day | ORAL | Status: DC
  Administered 2018-10-27 – 2018-10-29 (×3): 5 mg via ORAL
  Filled 2018-10-27 (×5): qty 1

## 2018-10-27 MED ORDER — NITROGLYCERIN 0.4 MG SL SUBL: 0.4000 mg | SUBLINGUAL_TABLET | SUBLINGUAL | Status: DC | PRN

## 2018-10-27 NOTE — Progress Notes (Addendum)
Family Meeting Note  Advance Directive:yes  Today a meeting took place with the Patient and spouse.   The following clinical team members were present during this meeting:MD  The following were discussed:Patient's diagnosis: Acute respiratory failure, healthcare associated pneumonia, acute on chronic systolic congestive heart failure, generalized weakness, Patient's progosis: Unable to determine and Goals for treatment: Continue present management  Additional follow-up to be provided: PMD  Time spent during discussion:20 minutes  Altamese Dilling, MD

## 2018-10-27 NOTE — ED Triage Notes (Signed)
Pt arrives to ED from [facility] via [facility] with c/c of shortness of breath. EMS reports Hx of CHF, COPD, and recent dx of pneumonia. Transport vitals: p76, 190/88, 92% on room air, 98% on 2L via nasal canula. Pt A&O x4 upon arrival. No acute distress evident.

## 2018-10-27 NOTE — Consult Note (Signed)
Pharmacy Antibiotic Note  Dylan [name] is a 83 y.o. male admitted on 10/27/2018 with pneumonia (HCAP)   Pharmacy has been consulted for Vancomycin/Cefepime dosing.  Plan: Total loading dose vanc in ED 2500mg  (1000mg  + 1500mg ) Followed by Vancomycin 2000 mg IV Q 24 hrs. Goal AUC 400-550. Expected AUC: 507 SCr used: 0.98  Cefepime 2g q 8h  Height: 6' (182.9 cm) Weight: 298 lb (135.2 kg) IBW/kg (Calculated) : 77.6  Temp (24hrs), Avg:98.3 F (36.8 C), Min:98.1 F (36.7 C), Max:98.5 F (36.9 C)  Recent Labs  Lab 10/27/18 0500 10/27/18 1525  WBC  --  7.6  CREATININE 1.09 0.98    Estimated Creatinine Clearance: 84.1 mL/min (by C-G formula based on SCr of 0.98 mg/dL).    No Known Allergies  Antimicrobials this admission: Vancomycin 2/25 >>  Cefepime 2/25 >>   Dose adjustments this admission: N/A  Microbiology results: 2/25 BCx: pending  2/25 MRSA PCR: pending  Thank you for allowing pharmacy to be a part of this patient's care.  Albina Billet, PharmD, BCPS Clinical Pharmacist 10/27/2018 7:09 PM

## 2018-10-27 NOTE — H&P (Signed)
[facility] [place] [facility]   PATIENT NAME: Dylan [name]    MR#:  [id number]  DATE OF BIRTH:  1936-04-16  DATE OF ADMISSION:  10/27/2018  PRIMARY CARE PHYSICIAN: [name], MD   REQUESTING/REFERRING PHYSICIAN: Derrill Kay  CHIEF COMPLAINT:   Chief Complaint  Patient presents with  . Shortness of Breath    HISTORY OF PRESENT ILLNESS: Sotirios Radden  is a 83 y.o. male with a known history of coronary artery disease, CHF, COPD, systolic congestive heart failure with EF of 35%, foot drop on the right side chronically for many years due to back surgery, hyperlipidemia, hypertension, myocardial infarction, sleep apnea-chronically was walking with a walker and had a fall in Christmas 2019-admitted with femur fracture and after surgery was sent to [facility] for rehab.  He was also treated for pneumonia at that time.  At the time of discharge he was advised to have partial weightbearing and did not receive sufficient physical therapy at the rehab center as per the family. For last 2 to 3 days he has worsening in shortness of breath and some sweating episodes.  On x-ray the facility noted that he is having pneumonia so started on Augmentin and sent to emergency room for further review.  PAST MEDICAL HISTORY:   Past Medical History:  Diagnosis Date  . CAD (coronary artery disease)   . Cervicalgia   . CHF (congestive heart failure) (HCC)   . COPD (chronic obstructive pulmonary disease) (HCC)   . Diastolic heart failure (HCC)   . Foot drop, right   . History of kidney stones   . Hyperlipidemia    unspecified  . Hypertension   . Myocardial infarction (HCC)   . Osteoarthritis   . Shoulder pain, left   . Sleep apnea   . Tremor, essential     PAST SURGICAL HISTORY:  Past Surgical History:  Procedure Laterality Date  . APPLICATION OF WOUND VAC Right 11/13/2017   Procedure: APPLICATION OF WOUND VAC;  Surgeon: Annice Needy, MD;  Location: [facility] ORS;  Service: Vascular;   Laterality: Right;  . BACK SURGERY  06/2010  . CARDIAC CATHETERIZATION Left 04/30/2016   Procedure: Left Heart Cath and Coronary Angiography;  Surgeon: Laurier Nancy, MD;  Location: [facility] INVASIVE CV LAB;  Service: Cardiovascular;  Laterality: Left;  . COLONOSCOPY    . CORONARY ANGIOPLASTY    . CORONARY/GRAFT ACUTE MI REVASCULARIZATION N/A 06/21/2018   Procedure: Coronary/Graft Acute MI Revascularization;  Surgeon: Iran Ouch, MD;  Location: [facility] INVASIVE CV LAB;  Service: Cardiovascular;  Laterality: N/A;  . KNEE ARTHROPLASTY Left 06/01/2018   Procedure: COMPUTER ASSISTED TOTAL KNEE ARTHROPLASTY;  Surgeon: Donato Heinz, MD;  Location: [facility] ORS;  Service: Orthopedics;  Laterality: Left;  . KNEE ARTHROSCOPY Left   . KNEE SURGERY Left 1998  . LEFT HEART CATH AND CORONARY ANGIOGRAPHY N/A 06/21/2018   Procedure: LEFT HEART CATH AND CORONARY ANGIOGRAPHY;  Surgeon: Iran Ouch, MD;  Location: [facility] INVASIVE CV LAB;  Service: Cardiovascular;  Laterality: N/A;  . ORIF FEMUR FRACTURE Left 08/27/2018   Procedure: OPEN REDUCTION INTERNAL FIXATION (ORIF) SUPRACONDYLAR FEMUR FRACTURE ABOVE PROSTHESIS, QUADRICEPS REPAIR;  Surgeon: Kennedy Bucker, MD;  Location: [facility] ORS;  Service: Orthopedics;  Laterality: Left;  . TONSILLECTOMY    . WOUND DEBRIDEMENT Right 11/13/2017   Procedure: DEBRIDEMENT WOUND;  Surgeon: Annice Needy, MD;  Location: [facility] ORS;  Service: Vascular;  Laterality: Right;    SOCIAL HISTORY:  Social History  Tobacco Use  . Smoking status: Former Smoker    Packs/day: 0.25    Years: 58.00    Pack years: 14.50    Types: Cigarettes  . Smokeless tobacco: Former NeurosurgeonUser  . Tobacco comment: quit the begginning of septembet 2019  Substance Use Topics  . Alcohol use: No    Alcohol/week: 0.0 standard drinks    FAMILY HISTORY:  Family History  Problem Relation Age of Onset  . Diabetes Son   . Cancer Mother   . Colon cancer Mother   . Lung cancer Mother   . Tremor Father    . Heart disease Father   . Tremor Brother   . Bladder Cancer Brother   . Tremor Sister     DRUG ALLERGIES: No Known Allergies  REVIEW OF SYSTEMS:   CONSTITUTIONAL: No fever, fatigue or weakness.  EYES: No blurred or double vision.  EARS, NOSE, AND THROAT: No tinnitus or ear pain.  RESPIRATORY: No cough, have shortness of breath, no wheezing or hemoptysis.  CARDIOVASCULAR: No chest pain, orthopnea, edema.  GASTROINTESTINAL: No nausea, vomiting, diarrhea or abdominal pain.  GENITOURINARY: No dysuria, hematuria.  ENDOCRINE: No polyuria, nocturia,  HEMATOLOGY: No anemia, easy bruising or bleeding SKIN: No rash or lesion. MUSCULOSKELETAL: No joint pain or arthritis.   NEUROLOGIC: No tingling, numbness, weakness.  PSYCHIATRY: No anxiety or depression.   MEDICATIONS AT HOME:  Prior to Admission medications   Medication Sig Start Date End Date Taking? Authorizing Provider  acetaminophen (TYLENOL) 500 MG tablet Take 1,000 mg by mouth 2 (two) times daily as needed for moderate pain.   Yes [provider]  alum & mag hydroxide-simeth (MAALOX PLUS) 400-400-40 MG/5ML suspension Take 30 mLs by mouth every 4 (four) hours as needed for indigestion. 09/04/18  Yes [provider]  amoxicillin-clavulanate (AUGMENTIN) 500-125 MG tablet Take 1 tablet by mouth 2 (two) times daily. 10/26/18 11/02/18 Yes [provider]  Ascorbic Acid (VITAMIN C PO) Take 500 mg by mouth daily.    Yes [provider]  aspirin EC 81 MG tablet Take 81 mg by mouth daily.   Yes [provider]  atorvastatin (LIPITOR) 80 MG tablet Take 80 mg by mouth daily.  09/04/18  Yes [provider]  bisacodyl (DULCOLAX) 10 MG suppository Place 10 mg rectally as needed for moderate constipation.   Yes [provider]  carvedilol (COREG) 6.25 MG tablet Take 6.25 mg by mouth 2 (two) times daily with a meal. 09/07/18  Yes [provider]  Cholecalciferol 100 MCG (4000 UT) TABS  Take 1 tablet by mouth daily. 09/07/18  Yes [provider]  clopidogrel (PLAVIX) 75 MG tablet Take 75 mg by mouth daily.   Yes [provider]  dextromethorphan-guaiFENesin (MUCINEX DM) 30-600 MG 12hr tablet Take 1 tablet by mouth 2 (two) times daily as needed for cough.   Yes [provider]  gabapentin (NEURONTIN) 400 MG capsule Take 400 mg by mouth at bedtime.   Yes [provider]  ipratropium-albuterol (DUONEB) 0.5-2.5 (3) MG/3ML SOLN Take 3 mLs by nebulization every 6 (six) hours as needed (shortness of breath).    Yes [provider]  isosorbide mononitrate (IMDUR) 30 MG 24 hr tablet Take 30 mg by mouth daily.  05/31/16  Yes [provider]  ketotifen (ZADITOR) 0.025 % ophthalmic solution Place 1 drop into both eyes 2 (two) times daily. 01/14/18  Yes Wieting, Richard, MD  loratadine (CLARITIN) 10 MG tablet Take 10 mg by mouth daily as  needed for allergies.   Yes [provider]  magnesium hydroxide (MILK OF MAGNESIA) 400 MG/5ML suspension Take 30 mLs by mouth every 4 (four) hours as needed for mild constipation (No BM in 2 days).   Yes [provider]  Melatonin 3 MG TABS Take 1 tablet by mouth at bedtime. 09/10/18  Yes [provider]  Multiple Vitamins-Minerals (CENTROVITE) TABS Take 1 tablet by mouth daily.   Yes [provider]  nitroGLYCERIN (NITROSTAT) 0.3 MG SL tablet Place 0.3 mg under the tongue every 5 (five) minutes as needed for chest pain.   Yes [provider]  nystatin ointment (MYCOSTATIN) Apply 1 application topically 4 (four) times daily. Apply to bilateral groin rashes 10/20/18 11/04/18 Yes [provider]  polyethylene glycol (MIRALAX / GLYCOLAX) packet Take 17 g by mouth 2 (two) times daily as needed. Hold for loose stools. 09/29/18  Yes [provider]  potassium chloride SA (K-DUR,KLOR-CON) 20 MEQ tablet Take 20 mEq by mouth 2 (two) times daily.    Yes [provider]  primidone (MYSOLINE) 50 MG tablet Take 200 mg by mouth 2 (two) times daily.    Yes [provider]  saccharomyces boulardii (FLORASTOR) 250 MG capsule Take 250 mg by mouth 2 (two) times daily. 10/26/18 12/04/18 Yes [provider]  sacubitril-valsartan (ENTRESTO) 24-26 MG Take 1 tablet by mouth 2 (two) times daily.   Yes [provider]  sennosides-docusate sodium (SENOKOT-S) 8.6-50 MG tablet Take 2 tablets by mouth 2 (two) times daily as needed for constipation.  09/07/18  Yes [provider]  Skin Protectants, Misc. (ENDIT EX) Apply to bottom and moisture associated breakdown of coccyx area BID and as need 09/15/18  Yes [provider]  spironolactone (ALDACTONE) 25 MG tablet Take 12.5 mg by mouth daily.  09/04/18  Yes [provider]  tamsulosin (FLOMAX) 0.4 MG CAPS capsule Take 1 capsule (0.4 mg total) by mouth daily. 09/01/18  Yes Evon Slack, PA-C  torsemide (DEMADEX) 20 MG tablet Take 40 mg by mouth daily.  09/21/18  Yes [provider]  umeclidinium-vilanterol (ANORO ELLIPTA) 62.5-25 MCG/INH AEPB Inhale 1 puff into the lungs daily. 09/04/17  Yes Shaune Pollack, MD      PHYSICAL EXAMINATION:   VITAL SIGNS: Blood pressure (!) 172/84, pulse 65, temperature 98.5 F (36.9 C), temperature source Oral, resp. rate (!) 38, height 6' (1.829 m), weight 135.2 kg, SpO2 98 %.  GENERAL:  83 y.o.-year-old patient lying in the bed with no acute distress.  EYES: Pupils equal, round, reactive to light and accommodation. No scleral icterus. Extraocular muscles intact.  HEENT: Head atraumatic, normocephalic. Oropharynx and nasopharynx clear.  NECK:  Supple, no jugular venous distention. No thyroid enlargement, no tenderness.  LUNGS: Normal breath sounds bilaterally, no wheezing, bilateral crepitation. No use of accessory muscles of respiration.  Nasal cannula oxygen in use. CARDIOVASCULAR: S1, S2 normal. No murmurs, rubs, or gallops.   ABDOMEN: Soft, nontender, nondistended. Bowel sounds present. No organomegaly or mass.  EXTREMITIES: Left side pedal edema, no cyanosis, or clubbing.  Left side immobilizer cast present. NEUROLOGIC: Cranial nerves II through XII are intact. Muscle strength 4/5 in both upper extremities, 0 out of 5 in right lower extremity and 2-3 out of 5 in left lower extremity. Sensation intact. Gait not checked.  PSYCHIATRIC: The patient is alert and oriented x 3.  SKIN: No obvious rash, lesion, or ulcer.   LABORATORY PANEL:   CBC Recent Labs  Lab 10/27/18 1525  WBC 7.6  HGB 10.8*  HCT 34.4*  PLT 202  MCV 98.0  MCH 30.8  MCHC 31.4  RDW 15.8*  LYMPHSABS 1.3  MONOABS 0.7  EOSABS 0.2  BASOSABS 0.0   ------------------------------------------------------------------------------------------------------------------  Chemistries  Recent Labs  Lab 10/27/18 0500 10/27/18 1525  NA 143 145  K 3.7 3.8  CL 103 102  CO2 31 33*  GLUCOSE 119* 122*  BUN 26* 25*  CREATININE 1.09 0.98  CALCIUM 8.6* 8.9   ------------------------------------------------------------------------------------------------------------------ estimated creatinine clearance is 84.1 mL/min (by C-G formula based on SCr of 0.98 mg/dL). ------------------------------------------------------------------------------------------------------------------ No results for input(s): TSH, T4TOTAL, T3FREE, THYROIDAB in the last 72 hours.  Invalid input(s): FREET3   Coagulation profile No results for input(s): INR, PROTIME in the last 168 hours. ------------------------------------------------------------------------------------------------------------------- No results for input(s): DDIMER in the last 72 hours. -------------------------------------------------------------------------------------------------------------------  Cardiac Enzymes No results for input(s): CKMB, TROPONINI, MYOGLOBIN in the last 168 hours.  Invalid  input(s): CK ------------------------------------------------------------------------------------------------------------------ Invalid input(s): POCBNP  ---------------------------------------------------------------------------------------------------------------  Urinalysis    Component Value Date/Time   COLORURINE YELLOW (A) 10/22/2018 1400   APPEARANCEUR CLEAR (A) 10/22/2018 1400   LABSPEC 1.012 10/22/2018 1400   PHURINE 6.0 10/22/2018 1400   GLUCOSEU NEGATIVE 10/22/2018 1400   HGBUR NEGATIVE 10/22/2018 1400   BILIRUBINUR NEGATIVE 10/22/2018 1400   KETONESUR NEGATIVE 10/22/2018 1400   PROTEINUR NEGATIVE 10/22/2018 1400   UROBILINOGEN 0.2 06/07/2010 0426   NITRITE NEGATIVE 10/22/2018 1400   LEUKOCYTESUR NEGATIVE 10/22/2018 1400     RADIOLOGY: Dg Chest 2 View  Result Date: 10/27/2018 CLINICAL DATA:  Shortness of breath today. EXAM: CHEST - 2 VIEW COMPARISON:  Single-view of the chest 01/10/2018, 08/30/2018 and 09/01/2018. FINDINGS: Left worse than right effusions and airspace disease are seen. Aeration in the right lung base appears mildly improved compared to the most recent examination. Cardiac silhouette is obscured. No pneumothorax. Aortic atherosclerosis noted. IMPRESSION: Left worse than right pleural effusions and airspace disease. Aeration in the right chest is mildly improved since the most recent examination. Atherosclerosis. Electronically Signed   By: Drusilla Kanner M.D.   On: 10/27/2018 14:48    EKG: Orders placed or performed during the hospital encounter of 10/27/18  . ED EKG  . ED EKG    IMPRESSION AND PLAN:  *Healthcare associated pneumonia- ac respi failure Patient developed hypoxia and sweating. We will give bank and cefepime and check for MRSA. Incentive spirometry. Supplement oxygen.  * Ac on ch systolic CHF   IV lasix, monitor I/O, daily weight.    *Hyperlipidemia Continue atorvastatin.  *Hypertension Continue home  medications.  *COPD Currently no exacerbation symptoms, continue DuoNeb as needed and supplemental oxygen now for pneumonia.  *Functional decline Patient has chronic right leg weakness with foot drop and recently had left femur fracture and did not had much improvement at the rehab center. I will get physical therapy evaluation. I have also had detailed discussion with family about ongoing care and possibility of having not much improvement or very prolonged recovery on strength from this. They are leaning towards getting a 24hour private pay home care along with home health services.  All the records are reviewed and case discussed with ED provider. Management plans discussed with the patient, family and they are in agreement.  CODE STATUS: Partial code Code Status History    Date Active Date Inactive Code Status Order ID Comments User Context   08/26/2018 1137 09/04/2018 1912 Full Code [id number]  Kennedy Bucker, MD ED   06/21/2018 1321 06/24/2018 2008 Partial  Code [id number]  Milagros Loll, MD Inpatient   06/21/2018 0803 06/21/2018 1321 Full Code [id number]  Iran Ouch, MD Inpatient   06/01/2018 2009 06/04/2018 1718 Full Code [id number]  Donato Heinz, MD Inpatient   01/11/2018 0012 01/14/2018 1908 Full Code [id number]  Cammy Copa, MD Inpatient   12/01/2017 1924 12/05/2017 1948 Full Code [id number]  Altamese Dilling, MD Inpatient   08/31/2017 0557 09/05/2017 1959 Full Code [id number]  Arnaldo Natal, MD Inpatient   06/10/2016 1934 06/13/2016 1816 Full Code [id number]  Shaune Pollack, MD Inpatient   12/15/2015 0134 12/16/2015 1507 Full Code [id number]  Ihor Austin, MD ED    Advance Directive Documentation     Most Recent Value  Type of Advance Directive  Healthcare Power of Attorney, Living will  Pre-existing out of facility DNR order (yellow form or pink MOST form)  -  "MOST" Form in Place?  -     Discussed with patient's wife and son in the room.  TOTAL TIME TAKING CARE OF THIS  PATIENT: 50 minutes.    Altamese Dilling M.D on 10/27/2018   Between 7am to 6pm - Pager - [phone]  After 6pm go to [email or web address] - password [email or web address]  [facility]  Office  [phone]  CC: Primary care physician; [name], MD   Note: This dictation was prepared with Dragon dictation along with smaller phrase technology. Any transcriptional errors that result from this process are unintentional.

## 2018-10-27 NOTE — ED Notes (Signed)
1 set blood cultures drawn and sent to lab for holding pending BC order.

## 2018-10-27 NOTE — ED Provider Notes (Signed)
[facility] Emergency Department Provider Note       Time seen: ----------------------------------------- 2:08 PM on 10/27/2018 -----------------------------------------   I have reviewed the triage vital signs and the nursing notes.  HISTORY   Chief Complaint No chief complaint on file.   HPI Dylan [name] is a 83 y.o. male with a history of coronary artery disease, CHF, COPD, hyperlipidemia, hypertension, MI, osteoarthritis who presents to the ED for possible pneumonia.  Patient reports he had an x-ray done at his "rest home" yesterday that was indicative of pneumonia.  He was started on antibiotics today.  He is complaining of shortness of breath with productive cough.  He denies fevers, chills or other complaints.  He has required some supplemental oxygen which he does not normally require.  Past Medical History:  Diagnosis Date  . CAD (coronary artery disease)   . Cervicalgia   . CHF (congestive heart failure) (HCC)   . COPD (chronic obstructive pulmonary disease) (HCC)   . Diastolic heart failure (HCC)   . Foot drop, right   . History of kidney stones   . Hyperlipidemia    unspecified  . Hypertension   . Myocardial infarction (HCC)   . Osteoarthritis   . Shoulder pain, left   . Sleep apnea   . Tremor, essential     Patient Active Problem List   Diagnosis Date Noted  . Hypokalemia 10/01/2018  . Hypertensive heart and kidney disease with acute on chronic diastolic congestive heart failure and stage 3 chronic kidney disease (HCC) 09/09/2018  . Chronic constipation 09/09/2018  . BPH with obstruction/lower urinary tract symptoms 09/09/2018  . Obstructive sleep apnea of adult 09/09/2018  . Idiopathic peripheral neuropathy 09/09/2018  . Chronic kidney disease, stage 3, mod decreased GFR (HCC) 09/09/2018  . Anemia of chronic renal failure, stage 3 (moderate) (HCC) 09/09/2018  . Periprosthetic fracture around internal prosthetic left knee joint  08/26/2018  . Acute ST elevation myocardial infarction (STEMI) of inferior wall (HCC) 06/21/2018  . S/P total knee arthroplasty 06/01/2018  . Primary osteoarthritis of left knee 02/05/2018  . Sepsis (HCC) 01/10/2018  . Community acquired pneumonia 12/01/2017  . Pneumonia 12/01/2017  . Hyperlipidemia 09/23/2017  . Weakness 08/31/2017  . Benign essential tremor 10/18/2016  . Class 3 severe obesity due to excess calories with serious comorbidity and body mass index (BMI) of 45.0 to 49.9 in adult (HCC) 10/18/2016  . Varicose veins of lower extremities with ulcer (HCC) 06/17/2016  . Chronic venous insufficiency 06/17/2016  . Lymphedema 06/17/2016  . Acute on chronic diastolic heart failure (HCC) 06/10/2016  . COPD, severe (HCC) 05/24/2016  . AV block, Mobitz 1 02/09/2016  . Coronary atherosclerosis of native coronary artery 02/09/2016  . ST elevation MI (STEMI) (HCC) 02/08/2016  . Right foot drop 04/07/2014  . Rotator cuff rupture, complete 04/22/2013  . Neck pain 04/14/2013    Past Surgical History:  Procedure Laterality Date  . APPLICATION OF WOUND VAC Right 11/13/2017   Procedure: APPLICATION OF WOUND VAC;  Surgeon: Annice Needy, MD;  Location: [facility] ORS;  Service: Vascular;  Laterality: Right;  . BACK SURGERY  06/2010  . CARDIAC CATHETERIZATION Left 04/30/2016   Procedure: Left Heart Cath and Coronary Angiography;  Surgeon: Laurier Nancy, MD;  Location: [facility] INVASIVE CV LAB;  Service: Cardiovascular;  Laterality: Left;  . COLONOSCOPY    . CORONARY ANGIOPLASTY    . CORONARY/GRAFT ACUTE MI REVASCULARIZATION N/A 06/21/2018   Procedure: Coronary/Graft Acute MI Revascularization;  Surgeon:  Iran Ouch, MD;  Location: [facility] INVASIVE CV LAB;  Service: Cardiovascular;  Laterality: N/A;  . KNEE ARTHROPLASTY Left 06/01/2018   Procedure: COMPUTER ASSISTED TOTAL KNEE ARTHROPLASTY;  Surgeon: Donato Heinz, MD;  Location: [facility] ORS;  Service: Orthopedics;  Laterality: Left;  . KNEE  ARTHROSCOPY Left   . KNEE SURGERY Left 1998  . LEFT HEART CATH AND CORONARY ANGIOGRAPHY N/A 06/21/2018   Procedure: LEFT HEART CATH AND CORONARY ANGIOGRAPHY;  Surgeon: Iran Ouch, MD;  Location: [facility] INV[facility]IVE CV LAB;  Service: Cardiovascular;  Laterality: N/A;  . ORIF FEMUR FRACTURE Left 08/27/2018   Procedure: OPEN REDUCTION INTERNAL FIXATION (ORIF) SUPRACONDYLAR FEMUR FRACTURE ABOVE PROSTHESIS, QUADRICEPS REPAIR;  Surgeon: Kennedy Bucker, MD;  Location: [facility] ORS;  Service: Orthopedics;  Laterality: Left;  . TONSILLECTOMY    . WOUND DEBRIDEMENT Right 11/13/2017   Procedure: DEBRIDEMENT WOUND;  Surgeon: Annice Needy, MD;  Location: [facility] ORS;  Service: Vascular;  Laterality: Right;    Allergies Patient has no known allergies.  Social History Social History   Tobacco Use  . Smoking status: Former Smoker    Packs/day: 0.25    Years: 58.00    Pack years: 14.50    Types: Cigarettes  . Smokeless tobacco: Former Neurosurgeon  . Tobacco comment: quit the begginning of septembet 2019  Substance Use Topics  . Alcohol use: No    Alcohol/week: 0.0 standard drinks  . Drug use: No    Review of Systems Constitutional: Negative for fever. Cardiovascular: Negative for chest pain. Respiratory: Positive for shortness of breath, cough Gastrointestinal: Negative for abdominal pain, vomiting and diarrhea. Musculoskeletal: Negative for back pain. Skin: Negative for rash. Neurological: Negative for headaches, positive for weakness  All systems negative/normal/unremarkable except as stated in the HPI  ____________________________________________   PHYSICAL EXAM:  VITAL SIGNS: ED Triage Vitals  Enc Vitals Group     BP      Pulse      Resp      Temp      Temp src      SpO2      Weight      Height      Head Circumference      Peak Flow      Pain Score      Pain Loc      Pain Edu?      Excl. in GC?    Constitutional: Alert and oriented. Well appearing and in no distress. Eyes:  Conjunctivae are somewhat pale ENT      Head: Normocephalic and atraumatic.      Nose: No congestion/rhinnorhea.      Mouth/Throat: Mucous membranes are moist.      Neck: No stridor. Cardiovascular: Normal rate, regular rhythm. No murmurs, rubs, or gallops. Respiratory: Normal respiratory effort without tachypnea nor retractions, diminished breath sounds on the left side Gastrointestinal: Soft and nontender. Normal bowel sounds Musculoskeletal: Nontender with normal range of motion in extremities. No lower extremity tenderness nor edema. Neurologic:  Normal speech and language. No gross focal neurologic deficits are appreciated.  Skin:  Skin is warm, dry and intact. No rash noted. Psychiatric: Mood and affect are normal. Speech and behavior are normal.  ____________________________________________  ED COURSE:  As part of my medical decision making, I reviewed the following data within the electronic MEDICAL RECORD NUMBER History obtained from family if available, nursing notes, old chart and ekg, as well as notes from prior ED visits. Patient presented for shortness of breath and cough,  we will assess with labs and imaging as indicated at this time.   Procedures ____________________________________________   LABS (pertinent positives/negatives)  Labs Reviewed  CBC WITH DIFFERENTIAL/PLATELET  BASIC METABOLIC PANEL  BRAIN NATRIURETIC PEPTIDE  INFLUENZA PANEL BY PCR (TYPE A & B)    RADIOLOGY  Chest x-ray IMPRESSION: Left worse than right pleural effusions and airspace disease. Aeration in the right chest is mildly improved since the most recent examination.  Atherosclerosis. ____________________________________________   DIFFERENTIAL DIAGNOSIS   CHF, COPD, pneumonia, influenza, anemia  FINAL ASSESSMENT AND PLAN  Dyspnea, pleural effusions   Plan: The patient had presented for [place] of breath and cough with recent outpatient diagnosis of pneumonia. Patient's labs are still  pending at this time. Patient's imaging revealed what appeared to be left worse than right pleural effusions with some potential airspace disease.  Disposition is pending at this time.   Ulice Dash, MD    Note: This note was generated in part or whole with voice recognition software. Voice recognition is usually quite accurate but there are transcription errors that can and very often do occur. I apologize for any typographical errors that were not detected and corrected.     Emily Filbert, MD 10/27/18 1500

## 2018-10-27 NOTE — ED Notes (Signed)
ED TO INPATIENT HANDOFF REPORT  Name/Age/Gender Dylan [name] 83 y.o. male  Code Status Code Status History    Date Active Date Inactive Code Status Order ID Comments User Context   08/26/2018 1137 09/04/2018 1912 Full Code [id number]  Kennedy Bucker, MD ED   06/21/2018 1321 06/24/2018 2008 Partial Code [id number]  Milagros Loll, MD Inpatient   06/21/2018 0803 06/21/2018 1321 Full Code [id number]  Iran Ouch, MD Inpatient   06/01/2018 2009 06/04/2018 1718 Full Code [id number]  Donato Heinz, MD Inpatient   01/11/2018 0012 01/14/2018 1908 Full Code [id number]  Cammy Copa, MD Inpatient   12/01/2017 1924 12/05/2017 1948 Full Code [id number]  Altamese Dilling, MD Inpatient   08/31/2017 0557 09/05/2017 1959 Full Code [id number]  Arnaldo Natal, MD Inpatient   06/10/2016 1934 06/13/2016 1816 Full Code [id number]  Shaune Pollack, MD Inpatient   12/15/2015 0134 12/16/2015 1507 Full Code [id number]  Ihor Austin, MD ED    Advance Directive Documentation     Most Recent Value  Type of Advance Directive  Healthcare Power of Attorney, Living will  Pre-existing out of facility DNR order (yellow form or pink MOST form)  -  "MOST" Form in Place?  -      Home/SNF/Other Home  Chief Complaint sob  Level of Care/Admitting Diagnosis ED Disposition    ED Disposition Condition Comment   Admit  Hospital Area: [facility] [[id number]]  Level of Care: Med-Surg [16]  Diagnosis: HCAP (healthcare-associated pneumonia) [568616]  Admitting Physician: Altamese Dilling [phone]  Attending Physician: Altamese Dilling [phone]  Estimated length of stay: past midnight tomorrow  Certification:: I certify this patient will need inpatient services for at least 2 midnights  PT Class (Do Not Modify): Inpatient [101]  PT Acc Code (Do Not Modify): Private [1]       Medical History Past Medical History:  Diagnosis Date  . CAD (coronary artery disease)   . Cervicalgia   . CHF  (congestive heart failure) (HCC)   . COPD (chronic obstructive pulmonary disease) (HCC)   . Diastolic heart failure (HCC)   . Foot drop, right   . History of kidney stones   . Hyperlipidemia    unspecified  . Hypertension   . Myocardial infarction (HCC)   . Osteoarthritis   . Shoulder pain, left   . Sleep apnea   . Tremor, essential     Allergies No Known Allergies  IV Location/Drains/Wounds Patient Lines/Drains/Airways Status   Active Line/Drains/Airways    Name:   Placement date:   Placement time:   Site:   Days:   Peripheral IV 10/27/18 Right Forearm   10/27/18    1523    Forearm   less than 1   Negative Pressure Wound Therapy Leg Left   08/27/18    1438    -   61   Incision (Closed) 06/01/18 Knee Left   06/01/18    1555     148   Incision (Closed) 08/27/18 Leg Left   08/27/18    1501     61   Pressure Injury 12/01/17 Stage I -  Intact skin with non-blanchable redness of a localized area usually over a bony prominence. purple. 1.5cmX1.5cm   12/01/17    2057     330   Wound / Incision (Open or Dehisced) 08/26/18 Toe (Comment  which one) Right open wound   08/26/18    1456    Toe (Comment  which one)   62  Labs/Imaging Results for orders placed or performed during the hospital encounter of 10/27/18 (from the past 48 hour(s))  CBC with Differential     Status: Abnormal   Collection Time: 10/27/18  3:25 PM  Result Value Ref Range   WBC 7.6 4.0 - 10.5 K/uL   RBC 3.51 (L) 4.22 - 5.81 MIL/uL   Hemoglobin 10.8 (L) 13.0 - 17.0 g/dL   HCT 16.1 (L) 09.6 - 04.5 %   MCV 98.0 80.0 - 100.0 fL   MCH 30.8 26.0 - 34.0 pg   MCHC 31.4 30.0 - 36.0 g/dL   RDW 40.9 (H) 81.1 - 91.4 %   Platelets 202 150 - 400 K/uL   nRBC 0.0 0.0 - 0.2 %   Neutrophils Relative % 70 %   Neutro Abs 5.4 1.7 - 7.7 K/uL   Lymphocytes Relative 17 %   Lymphs Abs 1.3 0.7 - 4.0 K/uL   Monocytes Relative 9 %   Monocytes Absolute 0.7 0.1 - 1.0 K/uL   Eosinophils Relative 2 %   Eosinophils Absolute 0.2  0.0 - 0.5 K/uL   Basophils Relative 1 %   Basophils Absolute 0.0 0.0 - 0.1 K/uL   Immature Granulocytes 1 %   Abs Immature Granulocytes 0.06 0.00 - 0.07 K/uL    Comment: Performed at [facility], [address] [place], [place] [address]  Basic metabolic panel     Status: Abnormal   Collection Time: 10/27/18  3:25 PM  Result Value Ref Range   Sodium 145 135 - 145 mmol/L   Potassium 3.8 3.5 - 5.1 mmol/L   Chloride 102 98 - 111 mmol/L   CO2 33 (H) 22 - 32 mmol/L   Glucose, Bld 122 (H) 70 - 99 mg/dL   BUN 25 (H) 8 - 23 mg/dL   Creatinine, Ser 6.21 0.61 - 1.24 mg/dL   Calcium 8.9 8.9 - 30.8 mg/dL   GFR calc non Af Amer >60 >60 mL/min   GFR calc Af Amer >60 >60 mL/min   Anion gap 10 5 - 15    Comment: Performed at [facility], [address] [place], [place] [address]  Brain natriuretic peptide     Status: Abnormal   Collection Time: 10/27/18  3:25 PM  Result Value Ref Range   B Natriuretic Peptide 2,327.0 (H) 0.0 - 100.0 pg/mL    Comment: Performed at [facility], [address] [place], [place] [address]  Influenza panel by PCR (type A & B)     Status: None   Collection Time: 10/27/18  3:30 PM  Result Value Ref Range   Influenza A By PCR NEGATIVE NEGATIVE   Influenza B By PCR NEGATIVE NEGATIVE    Comment: (NOTE) The Xpert Xpress Flu assay is intended as an aid in the diagnosis of  influenza and should not be used as a sole basis for treatment.  This  assay is FDA approved for nasopharyngeal swab specimens only. Nasal  washings and aspirates are unacceptable for Xpert Xpress Flu testing. Performed at [facility], [address], [place], [place] [address]    Dg Chest 2 View  Result Date: 10/27/2018 CLINICAL DATA:  Shortness of breath today. EXAM: CHEST - 2 VIEW COMPARISON:  Single-view of the chest 01/10/2018, 08/30/2018 and 09/01/2018. FINDINGS: Left worse than right effusions and airspace disease are seen. Aeration in the right  lung base appears mildly improved compared to the most recent examination. Cardiac silhouette is obscured. No pneumothorax. Aortic atherosclerosis noted.  IMPRESSION: Left worse than right pleural effusions and airspace disease. Aeration in the right chest is mildly improved since the most recent examination. Atherosclerosis. Electronically Signed   By: Drusilla Kanner M.D.   On: 10/27/2018 14:48    Pending Labs Unresulted Labs (From admission, onward)    Start     Ordered   10/27/18 1818  MRSA PCR Screening  Once,   STAT     10/27/18 1818   10/27/18 1635  Blood culture (routine x 2)  BLOOD CULTURE X 2,   STAT     10/27/18 1634   Signed and Held  Basic metabolic panel  Tomorrow morning,   R     Signed and Held   Signed and Held  CBC  Tomorrow morning,   R     Signed and Held   Signed and Held  CBC  (heparin)  Once,   R    Comments:  Baseline for heparin therapy IF NOT ALREADY DRAWN.  Notify MD if PLT < 100 K.    Signed and Held   Signed and Held  Creatinine, serum  (heparin)  Once,   R    Comments:  Baseline for heparin therapy IF NOT ALREADY DRAWN.    Signed and Held          Vitals/Pain Today's Vitals   10/27/18 1414 10/27/18 1707 10/27/18 1830 10/27/18 2001  BP:  (!) 172/84 (!) 183/87 (!) 173/78  Pulse:  65 71 66  Resp:  (!) 38 (!) 21 (!) 21  Temp:      TempSrc:      SpO2:  98% 99% 99%  Weight: 135.2 kg     Height: 6' (1.829 m)     PainSc: 0-No pain       Isolation Precautions No active isolations  Medications Medications  furosemide (LASIX) injection 20 mg (has no administration in time range)  vancomycin (VANCOCIN) 1,500 mg in sodium chloride 0.9 % 500 mL IVPB (has no administration in time range)  ceFEPIme (MAXIPIME) 2 g in sodium chloride 0.9 % 100 mL IVPB (has no administration in time range)  vancomycin (VANCOCIN) 2,000 mg in sodium chloride 0.9 % 500 mL IVPB (has no administration in time range)  ceFEPIme (MAXIPIME) 2 g in sodium chloride 0.9 % 100 mL IVPB  (0 g Intravenous Stopped 10/27/18 1755)  vancomycin (VANCOCIN) IVPB 1000 mg/200 mL premix (0 mg Intravenous Stopped 10/27/18 2006)  furosemide (LASIX) injection 40 mg (40 mg Intravenous Given 10/27/18 1721)    Mobility non-ambulatory

## 2018-10-27 NOTE — Progress Notes (Signed)
[facility]:  [facility] Room Number: 216-A Place of Service:  SNF ([phone]) Provider:  Kenard Gower, NP  Patient Care Team: [name], MD as PCP - General (Internal Medicine)  Extended Emergency Contact Information Primary Emergency Contact: Franchini,[name] Address: [address]          [place], [place] [address] [place] Home Phone: [phone] Mobile Phone: [phone] Relation: Spouse Secondary Emergency Contact: [place] Home Phone: [phone] Mobile Phone: [phone] Relation: Son  Code Status:  Full Code  Goals of care: Advanced Directive information Advanced Directives 10/13/2018  Does Patient Have a Medical Advance Directive? No  Type of Advance Directive -  Does patient want to make changes to medical advance directive? No - Patient declined  Copy of Healthcare Power of Attorney in Chart? -  Would patient like information on creating a medical advance directive? No - Patient declined     Chief Complaint  Patient presents with  . Acute Visit    Patient with bilateral pneumonia on chest x-ray    HPI:  Pt is an 83 y.o. male seen today for discharge. He was noted to have O2 @ 1.5L/min via Index. After an hour he complained of SOB with O2 sat of 100%. BNP at 2,293. He had an episode of desatting at 84% yesterday. Chest x-ray done yesterday showed bilateral pneumonia and was started on Augmentin.  He used the CPAP last night for 5 minutes and requested for it to be removed due to SOB. No reported fever.  He has been admitted to [facility] on 09/04/18 after being hospitalized due to a fall from which he sustained a fracture of left femur and had ORIF. He had healthcare associated pneumonia with bilateral effusion and was given Zosyn and Lasix while in the hospital. He, also, had Enterococcus faecalis and Klebsiella oxytoca UTI with multi-drug resistant. He had post-operative anemia and was transfused  with packed RBC. He has PMH of neuropathy, atherosclerotic heart disease, CKD stage 3, MI, obesity and chronic systolic heart failure.    Past Medical History:  Diagnosis Date  . CAD (coronary artery disease)   . Cervicalgia   . CHF (congestive heart failure) (HCC)   . COPD (chronic obstructive pulmonary disease) (HCC)   . Diastolic heart failure (HCC)   . Foot drop, right   . History of kidney stones   . Hyperlipidemia    unspecified  . Hypertension   . Myocardial infarction (HCC)   . Osteoarthritis   . Shoulder pain, left   . Sleep apnea   . Tremor, essential    Past Surgical History:  Procedure Laterality Date  . APPLICATION OF WOUND VAC Right 11/13/2017   Procedure: APPLICATION OF WOUND VAC;  Surgeon: Annice Needy, MD;  Location: [facility] ORS;  Service: Vascular;  Laterality: Right;  . BACK SURGERY  06/2010  . CARDIAC CATHETERIZATION Left 04/30/2016   Procedure: Left Heart Cath and Coronary Angiography;  Surgeon: Laurier Nancy, MD;  Location: [facility] INVASIVE CV LAB;  Service: Cardiovascular;  Laterality: Left;  . COLONOSCOPY    . CORONARY ANGIOPLASTY    . CORONARY/GRAFT ACUTE MI REVASCULARIZATION N/A 06/21/2018   Procedure: Coronary/Graft Acute MI Revascularization;  Surgeon: Iran Ouch, MD;  Location: [facility] INVASIVE CV LAB;  Service: Cardiovascular;  Laterality: N/A;  . KNEE ARTHROPLASTY Left 06/01/2018   Procedure: COMPUTER ASSISTED TOTAL KNEE ARTHROPLASTY;  Surgeon: Donato Heinz, MD;  Location: [facility] ORS;  Service: Orthopedics;  Laterality: Left;  . KNEE ARTHROSCOPY Left   . KNEE SURGERY Left 1998  . LEFT HEART CATH AND CORONARY ANGIOGRAPHY N/A 06/21/2018   Procedure: LEFT HEART CATH AND CORONARY ANGIOGRAPHY;  Surgeon: Iran Ouch, MD;  Location: [facility] INVASIVE CV LAB;  Service: Cardiovascular;  Laterality: N/A;  . ORIF FEMUR FRACTURE Left 08/27/2018   Procedure: OPEN REDUCTION INTERNAL FIXATION (ORIF) SUPRACONDYLAR FEMUR FRACTURE ABOVE PROSTHESIS, QUADRICEPS  REPAIR;  Surgeon: Kennedy Bucker, MD;  Location: [facility] ORS;  Service: Orthopedics;  Laterality: Left;  . TONSILLECTOMY    . WOUND DEBRIDEMENT Right 11/13/2017   Procedure: DEBRIDEMENT WOUND;  Surgeon: Annice Needy, MD;  Location: [facility] ORS;  Service: Vascular;  Laterality: Right;    No Known Allergies  Outpatient Encounter Medications as of 10/27/2018  Medication Sig  . acetaminophen (TYLENOL) 500 MG tablet Take 1,000 mg by mouth 2 (two) times daily as needed for moderate pain.  Marland Kitchen alum & mag hydroxide-simeth (MAALOX PLUS) 400-400-40 MG/5ML suspension Take 30 mLs by mouth every 4 (four) hours as needed for indigestion.  Marland Kitchen amoxicillin-clavulanate (AUGMENTIN) 500-125 MG tablet Take 1 tablet by mouth 2 (two) times daily.  . Ascorbic Acid (VITAMIN C PO) Take 500 mg by mouth daily.   Marland Kitchen aspirin EC 81 MG tablet Take 81 mg by mouth daily.  Marland Kitchen atorvastatin (LIPITOR) 80 MG tablet Take 80 mg by mouth daily.   . bisacodyl (DULCOLAX) 10 MG suppository Place 10 mg rectally as needed for moderate constipation.  . carvedilol (COREG) 6.25 MG tablet Take 6.25 mg by mouth 2 (two) times daily with a meal.  . Cholecalciferol 100 MCG (4000 UT) TABS Take 1 tablet by mouth daily.  . clopidogrel (PLAVIX) 75 MG tablet Take 75 mg by mouth daily.  Marland Kitchen dextromethorphan-guaiFENesin (MUCINEX DM) 30-600 MG 12hr tablet Take 1 tablet by mouth 2 (two) times daily as needed for cough.  . gabapentin (NEURONTIN) 400 MG capsule Take 400 mg by mouth at bedtime.  Marland Kitchen ipratropium-albuterol (DUONEB) 0.5-2.5 (3) MG/3ML SOLN Take 3 mLs by nebulization every 6 (six) hours as needed (shortness of breath).   . isosorbide mononitrate (IMDUR) 30 MG 24 hr tablet Take 30 mg by mouth daily.   Marland Kitchen ketotifen (ZADITOR) 0.025 % ophthalmic solution Place 1 drop into both eyes 2 (two) times daily.  Marland Kitchen loratadine (CLARITIN) 10 MG tablet Take 10 mg by mouth daily as needed for allergies.  . magnesium hydroxide (MILK OF MAGNESIA) 400 MG/5ML suspension Take 30 mLs  by mouth every 4 (four) hours as needed for mild constipation (No BM in 2 days).  . Melatonin 3 MG TABS Take 1 tablet by mouth at bedtime.  . Multiple Vitamins-Minerals (CENTROVITE) TABS Take 1 tablet by mouth daily.  . nitroGLYCERIN (NITROSTAT) 0.3 MG SL tablet Place 0.3 mg under the tongue every 5 (five) minutes as needed for chest pain.  . NON FORMULARY Diet type:  NAS  . nystatin ointment (MYCOSTATIN) Apply 1 application topically 4 (four) times daily. Apply to bilateral groin rashes  . OXYGEN Inhale 2 L/min into the lungs as needed.   . polyethylene glycol (MIRALAX / GLYCOLAX) packet Take 17 g by mouth 2 (two) times daily as needed. Hold for loose stools.  . potassium chloride (K-DUR,KLOR-CON) 10 MEQ tablet Take 10 mEq by mouth daily.   . primidone (MYSOLINE) 50 MG tablet Take 200 mg by mouth 2 (two) times daily.   Marland Kitchen saccharomyces boulardii (FLORASTOR) 250 MG capsule Take 250 mg by mouth 2 (  two) times daily.  . sacubitril-valsartan (ENTRESTO) 24-26 MG Take 1 tablet by mouth 2 (two) times daily.  . sennosides-docusate sodium (SENOKOT-S) 8.6-50 MG tablet Take 2 tablets by mouth 2 (two) times daily.  . Skin Protectants, Misc. (ENDIT EX) Apply to bottom and moisture associated breakdown of coccyx area BID and as need  . spironolactone (ALDACTONE) 25 MG tablet Take 12.5 mg by mouth daily.   . tamsulosin (FLOMAX) 0.4 MG CAPS capsule Take 1 capsule (0.4 mg total) by mouth daily.  Marland Kitchen torsemide (DEMADEX) 20 MG tablet Take 40 mg by mouth daily.   Marland Kitchen umeclidinium-vilanterol (ANORO ELLIPTA) 62.5-25 MCG/INH AEPB Inhale 1 puff into the lungs daily.  . Wound Dressings (ALLEVYN ADHESIVE) PADS Apply patch to right great toe - change every 3 days  . [DISCONTINUED] nicotine (NICODERM CQ - DOSED IN MG/24 HOURS) 21 mg/24hr patch Place 21 mg onto the skin daily.   No facility-administered encounter medications on file as of 10/27/2018.     Review of Systems  GENERAL: No change in appetite, no fatigue, no  weight changes, no fever, chills or weakness MOUTH and THROAT: Denies oral discomfort, gingival pain or bleeding, pain from teeth or hoarseness   RESPIRATORY: +SOB and coughing CARDIAC: No chest pain, edema or palpitations GI: No abdominal pain, diarrhea, constipation, heart burn, nausea or vomiting NEUROLOGICAL: Denies dizziness, syncope, numbness, or headache PSYCHIATRIC: Denies feelings of depression or anxiety. No report of hallucinations, insomnia, paranoia, or agitation    Immunization History  Administered Date(s) Administered  . Influenza, High Dose Seasonal PF 06/03/2018  . Influenza,inj,Quad PF,6+ Mos 06/11/2016   Pertinent  Health Maintenance Due  Topic Date Due  . PNA vac Low Risk Adult (1 of 2 - PCV13) 11/30/2018 (Originally 09/14/2002)  . INFLUENZA VACCINE  Completed   No flowsheet data found.   Vitals:   10/27/18 1126 10/27/18 1127  BP: (!) 165/91 (!) 177/92  Pulse: 80   Resp: (!) 22   Temp: 98.1 F (36.7 C)   TempSrc: Oral   SpO2: 100%   Weight: (!) 302 lb 9.6 oz (137.3 kg)   Height:  (1.778 m)    Body mass index is 43.42 kg/m.  Physical Exam  GENERAL APPEARANCE: Well nourished. In no acute distress. Morbidly obese. SKIN:  Right big toe with dry scab MOUTH and THROAT: Lips are without lesions. Oral mucosa is moist and without lesions. Tongue is normal in shape, size, and color and without lesions RESPIRATORY: Rales on bilateral lower lungs, decreased breath sounds on left lungs, dyspneic and was using abdominal muscles when breathing CARDIAC: RRR, no murmur,no extra heart sounds, no edema GI: Abdomen soft, normal BS, no masses, no tenderness EXTREMITIES: Has left leg immobilizer. NEUROLOGICAL: There is no tremor. Speech is clear. Alert to place and self, disoriented to time.Marland Kitchen PSYCHIATRIC:  Affect and behavior are appropriate   Labs reviewed: Recent Labs    12/04/17 0613  09/28/18 0550 10/15/18 0638 10/19/18 0545 10/27/18 0500  NA 139   <  > 141 142  --  143  K 3.3*   < > 3.3* 3.2* 3.8 3.7  CL 104   < > 103 103  --  103  CO2 26   < > 31 33*  --  31  GLUCOSE 101*   < > 104* 101*  --  119*  BUN 47*   < > 28* 28*  --  26*  CREATININE 1.66*   < > 1.04 1.09  --  1.09  CALCIUM 7.9*   < > 8.3* 8.3*  --  8.6*  MG 2.0  --   --   --   --   --    < > = values in this interval not displayed.   Recent Labs    05/20/18 1001 06/21/18 0602 08/26/18 0821  AST 38 31 38  ALT 44 24 37  ALKPHOS 105 97 73  BILITOT 1.0 0.7 0.9  PROT 6.8 6.7 5.7*  ALBUMIN 3.4* 3.3* 3.0*   Recent Labs    05/27/18 1152 06/21/18 0602  08/26/18 0821  09/01/18 0306 09/02/18 0254 09/03/18 1020  WBC 9.0 8.8   < > 9.7   < > 10.1 9.1 7.7  NEUTROABS 5.6 5.6  --  7.6  --   --   --   --   HGB 13.4 11.9*   < > 10.2*   < > 9.2* 8.5* 9.1*  HCT 39.4* 37.1*   < > 32.5*   < > 29.5* 27.0* 29.0*  MCV 95.8 100.0   < > 99.7   < > 100.7* 100.4* 99.3  PLT 143* 226   < > 136*   < > 171 209 218   < > = values in this interval not displayed.   Lab Results  Component Value Date   TSH 3.782 08/31/2017   Lab Results  Component Value Date   HGBA1C 5.8 (H) 05/20/2018   Lab Results  Component Value Date   CHOL 100 09/08/2017   HDL 34 (L) 09/08/2017   LDLCALC 54 09/08/2017   TRIG 61 09/08/2017   CHOLHDL 2.9 09/08/2017    Assessment/Plan  1. Facility-acquired pneumonia - chest x-ray showed bilateral lower lobe pneumonia, was started on Augmentin and Mucinex  2. Chronic systolic heart failure (HCC) - BNP 2,293.0, continue with Spironolactone, Entresto, Carvedilol and Torsemide   3. Acute respiratory failure with hypoxia (HCC) - continue O2 @ 1.5L/min continuously, ipratropium-albuterol nebulization PRN    Family/ staff Communication: Discussed plan of care with wife, patient and sister.  Labs/tests ordered:  None  Goals of care:   Will need to be transferred to the hospital for further evaluation.   Kenard Gower, NP [facility] [phone] (Monday-Friday 8:00 a.m. - 5:00 p.m.) [phone] (after hours)

## 2018-10-27 NOTE — ED Notes (Signed)
Pts penis is inverted and it is not possible to expose the urethra due to the way that it is retracted in the foreskin. Dr Derrill Kay notified.

## 2018-10-27 NOTE — ED Provider Notes (Signed)
BNP 2327 Influenza negative CBC wbc 7.6, hgb 10.8, plt 202 BMP na 145, k 3.8, glu 122, cr 0.98  Work up consistent with CHF, concern for pneumonia. Will start IV antibiotics and plan on admission.   Phineas Semen, MD 10/27/18 [phone]

## 2018-10-28 LAB — MRSA PCR SCREENING: MRSA by PCR: POSITIVE — AB

## 2018-10-28 LAB — BASIC METABOLIC PANEL
Anion gap: 11 (ref 5–15)
BUN: 26 mg/dL — ABNORMAL HIGH (ref 8–23)
CHLORIDE: 102 mmol/L (ref 98–111)
CO2: 31 mmol/L (ref 22–32)
Calcium: 8.9 mg/dL (ref 8.9–10.3)
Creatinine, Ser: 0.91 mg/dL (ref 0.61–1.24)
GFR calc Af Amer: >60 mL/min (ref >60)
GFR calc non Af Amer: >60 mL/min (ref >60)
Glucose, Bld: 122 mg/dL — ABNORMAL HIGH (ref 70–99)
Potassium: 3.5 mmol/L (ref 3.5–5.1)
Sodium: 144 mmol/L (ref 135–145)

## 2018-10-28 LAB — CBC
HEMATOCRIT: 38.4 % — AB (ref 39.0–52.0)
Hemoglobin: 11.7 g/dL — ABNORMAL LOW (ref 13.0–17.0)
MCH: 29.7 pg (ref 26.0–34.0)
MCHC: 30.5 g/dL (ref 30.0–36.0)
MCV: 97.5 fL (ref 80.0–100.0)
Platelets: 201 10*3/uL (ref 150–400)
RBC: 3.94 MIL/uL — ABNORMAL LOW (ref 4.22–5.81)
RDW: 15.7 % — ABNORMAL HIGH (ref 11.5–15.5)
WBC: 9.2 10*3/uL (ref 4.0–10.5)
nRBC: 0 % (ref 0.0–0.2)

## 2018-10-28 LAB — PROCALCITONIN: Procalcitonin: <0.1 ng/mL

## 2018-10-28 MED ORDER — DOXYCYCLINE HYCLATE 100 MG PO TABS
100.0000 mg | ORAL_TABLET | Freq: Two times a day (BID) | ORAL | Status: DC
  Administered 2018-10-28 – 2018-10-31 (×6): 100 mg via ORAL
  Filled 2018-10-28 (×7): qty 1

## 2018-10-28 MED ORDER — SODIUM CHLORIDE 0.9 % IV SOLN
2.0000 g | INTRAVENOUS | Status: DC
  Administered 2018-10-28 – 2018-10-29 (×2): 2 g via INTRAVENOUS
  Filled 2018-10-28 (×2): qty 2
  Filled 2018-10-28: qty 20

## 2018-10-28 MED ORDER — SODIUM CHLORIDE 0.9 % IV SOLN
INTRAVENOUS | Status: DC | PRN
  Administered 2018-10-28 – 2018-10-29 (×2): 500 mL via INTRAVENOUS

## 2018-10-28 MED ORDER — CHLORHEXIDINE GLUCONATE CLOTH 2 % EX PADS
6.0000 | MEDICATED_PAD | Freq: Every day | CUTANEOUS | Status: DC
  Administered 2018-10-28 – 2018-10-31 (×4): 6 via TOPICAL

## 2018-10-28 MED ORDER — MUPIROCIN 2 % EX OINT
1.0000 "application " | TOPICAL_OINTMENT | Freq: Two times a day (BID) | CUTANEOUS | Status: DC
  Administered 2018-10-28 – 2018-10-31 (×7): 1 via NASAL
  Filled 2018-10-28: qty 22

## 2018-10-28 NOTE — Evaluation (Signed)
Physical Therapy Evaluation Patient Details Name: Dylan [name] MRN: [id number] DOB: 08/20/1936 Today's Date: 10/28/2018   History of Present Illness  Pt is an 83 y.o. male presenting to hospital 10/27/18 with SOB (came from STR).  Pt admitted with HCAP, acute on chronic systolic CHF, HLD, htn, and COPD.  PMH includes ORIF L femur fx with quad repair 08/27/18; L TKA 06/01/18, CAD, cervicalgia, CHF, COPD, diastolic heart failure, R foot drop, htn, MI, L shoulder pain, h/o back surgery, tremor, L rotator cuff injury.  Clinical Impression  Prior to hospital admission, pt was receiving therapy at rehab.  Pt reports having ortho appt on Monday with upgraded precautions (see below for details).  Currently pt is 2 assist semi-supine to/from sit and min to mod assist x1 for sitting balance d/t posterior lean.  Attempted repositioning pt sitting on edge of bed (including improving posture) but pt kept c/o feeling "scrunched up" and reported difficulty breathing d/t this, so pt assisted back to bed.  O2 sats 96% or greater on 2 L O2 via nasal cannula during session.  Pt would benefit from skilled PT to address noted impairments and functional limitations (see below for any additional details).  Pt appears to have been at [facility] since hospital discharge 09/04/18 with PWB'ing status and pt reporting minimal progress.  Therapist contacted Dr. Rosita Kea today regarding L LE precautions: MD Rosita Kea reported pt was WBAT with brace locked in extension; out of brace for ROM and strengthening.  D/t pt appearing with upgraded WB'ing at MD appt 10/26/18, pt would benefit from STR trial.    Follow Up Recommendations SNF(trial)    Equipment Recommendations  Hospital bed;Wheelchair (measurements PT);Wheelchair cushion (measurements PT);Other (comment);3in1 (PT)(hoyer lift)    Recommendations for Other Services       Precautions / Restrictions Precautions Precautions: Fall Required Braces or Orthoses: Other Brace Other Brace: Out of  brace for ROM and strengthening (per MD Rosita Kea 10/28/18) Restrictions Weight Bearing Restrictions: Yes LLE Weight Bearing: Weight bearing as tolerated Other Position/Activity Restrictions: L LE WBAT with brace locked in extension (per MD Rosita Kea 10/28/18)      Mobility  Bed Mobility Overal bed mobility: Needs Assistance Bed Mobility: Supine to Sit;Sit to Supine     Supine to sit: Mod assist;Max assist;+2 for physical assistance;HOB elevated Sit to supine: Mod assist;Max assist;+2 for physical assistance;HOB elevated   General bed mobility comments: assist for trunk and B LE's semi-supine to/from sit; increased time to perform; 2 assist to boost pt up in bed end of session  Transfers                 General transfer comment: Deferred d/t pt reporting difficulty breathing in sitting position (so pt assisted back to bed to improve ability to breath)  Ambulation/Gait                Stairs            Wheelchair Mobility    Modified Rankin (Stroke Patients Only)       Balance Overall balance assessment: Needs assistance Sitting-balance support: Feet supported;Bilateral upper extremity supported Sitting balance-Leahy Scale: Poor Sitting balance - Comments: pt requiring B UE support for static sitting balance; posterior lean noted requiring min to mod assist to maintain upright Postural control: Posterior lean                                   Pertinent  Vitals/Pain  HR WFL during session.    Home Living Family/patient expects to be discharged to:: Skilled nursing facility                      Prior Function Level of Independence: Needs assistance   Gait / Transfers Assistance Needed: At rehab pt reports he has only sat on edge of bed a couple times and has not been able to stand.  Prior to injury in December per notes "Pt able to ambulate from bed to [facility] and to bedside commode with RW with supervision."  ADL's / Homemaking Assistance  Needed: Assistance needed for ADLs, dependent for IADLs.  Comments: R AFO     Hand Dominance        Extremity/Trunk Assessment   Upper Extremity Assessment Upper Extremity Assessment: Generalized weakness    Lower Extremity Assessment Lower Extremity Assessment: Generalized weakness;LLE deficits/detail;RLE deficits/detail RLE Deficits / Details: R foot drop with impaired DF ROM noted LLE Deficits / Details: L knee kept straight during evaluation with L LE brace donned; able to perform DF/PF AROM    Cervical / Trunk Assessment Cervical / Trunk Assessment: Normal  Communication   Communication: No difficulties  Cognition Arousal/Alertness: (Drowsy at times) Behavior During Therapy: WFL for tasks assessed/performed Overall Cognitive Status: Within Functional Limits for tasks assessed                                        General Comments   Nursing cleared pt for participation in physical therapy.  Pt agreeable to PT session.    Exercises  Transfer training; sitting balance   Assessment/Plan    PT Assessment Patient needs continued PT services  PT Problem List Decreased strength;Decreased range of motion;Decreased activity tolerance;Decreased balance;Decreased mobility;Decreased knowledge of use of DME;Decreased knowledge of precautions;Pain       PT Treatment Interventions DME instruction;Gait training;Functional mobility training;Therapeutic activities;Therapeutic exercise;Balance training;Patient/family education    PT Goals (Current goals can be found in the Care Plan section)  Acute Rehab PT Goals Patient Stated Goal: to improve mobility PT Goal Formulation: With patient Time For Goal Achievement: 11/11/18 Potential to Achieve Goals: Fair    Frequency Min 2X/week   Barriers to discharge Decreased caregiver support      Co-evaluation               AM-PAC PT "6 Clicks" Mobility  Outcome Measure Help needed turning from your back to  your side while in a flat bed without using bedrails?: A Lot Help needed moving from lying on your back to sitting on the side of a flat bed without using bedrails?: Total Help needed moving to and from a bed to a chair (including a wheelchair)?: Total Help needed standing up from a chair using your arms (e.g., wheelchair or bedside chair)?: Total Help needed to walk in hospital room?: Total Help needed climbing 3-5 steps with a railing? : Total 6 Click Score: 7    End of Session Equipment Utilized During Treatment: Oxygen;Left knee immobilizer Activity Tolerance: Other (comment)(Limited d/t difficulty breathing in sitting) Patient left: in bed;with call bell/phone within reach;with bed alarm set Nurse Communication: Mobility status;Precautions;Weight bearing status;Other (comment)(Brace use) PT Visit Diagnosis: Other abnormalities of gait and mobility (R26.89);Muscle weakness (generalized) (M62.81);History of falling (Z91.81);Difficulty in walking, not elsewhere classified (R26.2)    Time: 0109-3235 PT Time Calculation (min) (ACUTE ONLY): 44  min   Charges:   PT Evaluation $PT Eval Low Complexity: 1 Low PT Treatments $Therapeutic Activity: 23-37 mins       Hendricks Limes, PT 10/28/18, 4:22 PM [phone]

## 2018-10-28 NOTE — Progress Notes (Signed)
Patient ID: Dylan [name], male   DOB: [birth year], 83 y.o.   MRN: [id number]  [employer] PROGRESS NOTE  Dylan [name]:[id number] DOB: Sep 15, 1935 DOA: 10/27/2018 PCP: [name], MD  HPI/Subjective: Patient not feeling too good.  Some shortness of breath going on for a few days.  Did not sleep very well last night.  Objective: Vitals:   10/28/18 0320 10/28/18 1145  BP: (!) 166/94 (!) 153/78  Pulse: 77 69  Resp: 19   Temp: (!) 97.4 F (36.3 C)   SpO2: 99% 100%    Filed Weights   10/27/18 1414 10/27/18 2054 10/28/18 0500  Weight: 135.2 kg 129.2 kg 128.2 kg    ROS: Review of Systems  Constitutional: Negative for chills and fever.  Eyes: Negative for blurred vision.  Respiratory: Positive for cough and shortness of breath.   Cardiovascular: Negative for chest pain.  Gastrointestinal: Negative for abdominal pain, constipation, diarrhea, nausea and vomiting.  Genitourinary: Negative for dysuria.  Musculoskeletal: Negative for joint pain.  Neurological: Negative for dizziness and headaches.   Exam: Physical Exam  HENT:  Nose: No mucosal edema.  Mouth/Throat: No oropharyngeal exudate or posterior oropharyngeal edema.  Eyes: Pupils are equal, round, and reactive to light. Conjunctivae, EOM and lids are normal.  Neck: No JVD present. Carotid bruit is not present. No edema present. No thyroid mass and no thyromegaly present.  Cardiovascular: S1 normal and S2 normal. Exam reveals no gallop.  No murmur heard. Pulses:      Dorsalis pedis pulses are 2+ on the right side and 2+ on the left side.  Respiratory: No respiratory distress. He has decreased breath sounds in the right lower field, the left middle field and the left lower field. He has no wheezes. He has rhonchi in the left middle field and the left lower field. He has rales in the right lower field.  GI: Soft. Bowel sounds are normal. There is no abdominal tenderness.  Musculoskeletal:     Right ankle: He exhibits  swelling.     Left ankle: He exhibits swelling.  Lymphadenopathy:    He has no cervical adenopathy.  Neurological: He is alert. No cranial nerve deficit.  Skin: Skin is warm. Nails show no clubbing.  Chronic lower extremity skin discoloration  Psychiatric: He has a normal mood and affect.      Data Reviewed: Basic Metabolic Panel: Recent Labs  Lab 10/27/18 0500 10/27/18 1525 10/28/18 0402  NA 143 145 144  K 3.7 3.8 3.5  CL 103 102 102  CO2 31 33* 31  GLUCOSE 119* 122* 122*  BUN 26* 25* 26*  CREATININE 1.09 0.98 0.91  CALCIUM 8.6* 8.9 8.9   CBC: Recent Labs  Lab 10/27/18 1525 10/28/18 0402  WBC 7.6 9.2  NEUTROABS 5.4  --   HGB 10.8* 11.7*  HCT 34.4* 38.4*  MCV 98.0 97.5  PLT 202 201   BNP (last 3 results) Recent Labs    06/21/18 0605 10/27/18 0744 10/27/18 1525  BNP 684.0* 2,293.0* 2,327.0*      Recent Results (from the past 240 hour(s))  Urine Culture     Status: None   Collection Time: 10/20/18  5:40 PM  Result Value Ref Range Status   Specimen Description   Final    URINE, RANDOM Performed at [facility], [address] [place], [place] [address]    Special Requests   Final    NONE Performed at [facility], [address] [place],  [place] [address]    Culture   Final    Multiple bacterial morphotypes present, none predominant. Suggest appropriate recollection if clinically indicated.   Report Status 10/22/2018 FINAL  Final  Urine Culture     Status: None   Collection Time: 10/22/18  2:00 PM  Result Value Ref Range Status   Specimen Description   Final    URINE, CLEAN CATCH Performed at [facility], [address] [place], [place] [address]    Special Requests   Final    NONE Performed at [facility], [address] [place], [place] [address]    Culture   Final    Multiple bacterial morphotypes present, none predominant. Suggest appropriate recollection if clinically indicated.   Report  Status 10/23/2018 FINAL  Final  Blood culture (routine x 2)     Status: None (Preliminary result)   Collection Time: 10/27/18  3:25 PM  Result Value Ref Range Status   Specimen Description BLOOD BLOOD RIGHT FOREARM  Final   Special Requests   Final    BOTTLES DRAWN AEROBIC AND ANAEROBIC Blood Culture adequate volume   Culture   Final    NO GROWTH < 24 HOURS Performed at [facility], [address] [place], [place] [address]    Report Status PENDING  Incomplete  Blood culture (routine x 2)     Status: None (Preliminary result)   Collection Time: 10/27/18  9:40 PM  Result Value Ref Range Status   Specimen Description BLOOD LEFT ANTECUBITAL  Final   Special Requests   Final    BOTTLES DRAWN AEROBIC AND ANAEROBIC Blood Culture adequate volume   Culture   Final    NO GROWTH < 12 HOURS Performed at [facility], [address], [place], [place] [address]    Report Status PENDING  Incomplete  MRSA PCR Screening     Status: Abnormal   Collection Time: 10/27/18 11:21 PM  Result Value Ref Range Status   MRSA by PCR POSITIVE (A) NEGATIVE Final    Comment:        The GeneXpert MRSA Assay (FDA approved for NASAL specimens only), is one component of a comprehensive MRSA colonization surveillance program. It is not intended to diagnose MRSA infection nor to guide or monitor treatment for MRSA infections. RESULT CALLED TO, READ BACK BY AND VERIFIED WITH: SLYVIA PRENTICE 10/28/2018 0042 REC Performed at [facility], [address], [place], [place] [address]      Studies: Dg Chest 2 View  Result Date: 10/27/2018 CLINICAL DATA:  Shortness of breath today. EXAM: CHEST - 2 VIEW COMPARISON:  Single-view of the chest 01/10/2018, 08/30/2018 and 09/01/2018. FINDINGS: Left worse than right effusions and airspace disease are seen. Aeration in the right lung base appears mildly improved compared to the most recent examination. Cardiac silhouette is obscured. No  pneumothorax. Aortic atherosclerosis noted. IMPRESSION: Left worse than right pleural effusions and airspace disease. Aeration in the right chest is mildly improved since the most recent examination. Atherosclerosis. Electronically Signed   By: Drusilla Kanner M.D.   On: 10/27/2018 14:48    Scheduled Meds: . aspirin EC  81 mg Oral Daily  . atorvastatin  80 mg Oral Daily  . carvedilol  6.25 mg Oral BID WC  . Chlorhexidine Gluconate Cloth  6 each Topical Q0600  . cholecalciferol  1,000 Units Oral Daily  . clopidogrel  75 mg Oral Daily  . furosemide  20 mg Intravenous Q8H  . gabapentin  400 mg Oral QHS  . heparin  5,000 Units Subcutaneous Q8H  . isosorbide mononitrate  30 mg Oral Daily  . ketotifen  1 drop Both Eyes BID  . Melatonin  5 mg Oral QHS  . multivitamin with minerals  1 tablet Oral Daily  . mupirocin ointment  1 application Nasal BID  . nystatin ointment  1 application Topical QID  . potassium chloride SA  20 mEq Oral BID  . primidone  200 mg Oral BID  . saccharomyces boulardii  250 mg Oral BID  . sacubitril-valsartan  1 tablet Oral BID  . spironolactone  12.5 mg Oral Daily  . tamsulosin  0.4 mg Oral Daily  . umeclidinium-vilanterol  1 puff Inhalation Daily  . vitamin C  500 mg Oral Daily   Continuous Infusions: . ceFEPime (MAXIPIME) IV Stopped (10/28/18 0210)  . vancomycin      Assessment/Plan:  1. Acute on chronic systolic congestive heart failure.  Patient on Coreg Entresto and IV Lasix. 2. Left pleural effusion.  I explained to the patient he may feel a lot better quicker if we did a thoracentesis.  Patient wanted to hold off with this procedure at this time. 3. Possible pneumonia.  Continue antibiotics Maxipime and vancomycin.  We will send off a procalcitonin.  Unclear if this is all CHF versus pneumonia. 4. Hypertension continue usual medications. 5. COPD.  Patient states he does not use oxygen at home.  See if we can taper off oxygen.  No current  exacerbation. 6. Functional decline.  Patient has chronic right leg weakness with foot drop.  Recent left femur fracture.  Patient came from [place].  Patient states that he is going to go home after this hospitalization. 7. BPH on Flomax 8. Hyperlipidemia unspecified on atorvastatin 9. Neuropathy on gabapentin  Code Status:     Code Status Orders  (From admission, onward)         Start     Ordered   10/27/18 2102  Limited resuscitation (code)  Continuous    Question Answer Comment  In the event of cardiac or respiratory ARREST: Initiate Code Blue, Call Rapid Response Yes   In the event of cardiac or respiratory ARREST: Perform CPR Yes   In the event of cardiac or respiratory ARREST: Perform Intubation/Mechanical Ventilation No   In the event of cardiac or respiratory ARREST: Use NIPPV/BiPAp only if indicated Yes   In the event of cardiac or respiratory ARREST: Administer ACLS medications if indicated Yes   In the event of cardiac or respiratory ARREST: Perform Defibrillation or Cardioversion if indicated Yes      10/27/18 2101        Code Status History    Date Active Date Inactive Code Status Order ID Comments User Context   08/26/2018 1137 09/04/2018 1912 Full Code [id number]  Kennedy Bucker, MD ED   06/21/2018 1321 06/24/2018 2008 Partial Code [id number]  Milagros Loll, MD Inpatient   06/21/2018 0803 06/21/2018 1321 Full Code [id number]  Iran Ouch, MD Inpatient   06/01/2018 2009 06/04/2018 1718 Full Code [id number]  Donato Heinz, MD Inpatient   01/11/2018 0012 01/14/2018 1908 Full Code [id number]  Cammy Copa, MD Inpatient   12/01/2017 1924 12/05/2017 1948 Full Code [id number]  Altamese Dilling, MD Inpatient   08/31/2017 0557 09/05/2017 1959 Full Code [id number]  Arnaldo Natal, MD Inpatient   06/10/2016 1934 06/13/2016 1816 Full Code [id number]  Shaune Pollack, MD Inpatient   12/15/2015 0134 12/16/2015 1507 Full Code [id number]  Ihor Austin, MD ED    Advance Directive  Documentation     Most Recent Value  Type of Advance Directive  Healthcare Power of Attorney  Pre-existing out of facility DNR order (yellow form or pink MOST form)  -  "MOST" Form in Place?  -     Family Communication: Left message for the patient's wife Disposition Plan: To be determined  Time spent: 28 minutes  [place] [employer]

## 2018-10-29 ENCOUNTER — Inpatient Hospital Stay: Payer: Medicare Other

## 2018-10-29 LAB — PROCALCITONIN

## 2018-10-29 MED ORDER — OXYCODONE HCL 5 MG PO TABS
5.0000 mg | ORAL_TABLET | ORAL | Status: DC | PRN
  Administered 2018-10-29 – 2018-10-30 (×2): 5 mg via ORAL
  Filled 2018-10-29 (×2): qty 1

## 2018-10-29 MED ORDER — FUROSEMIDE 10 MG/ML IJ SOLN
40.0000 mg | Freq: Three times a day (TID) | INTRAMUSCULAR | Status: DC
  Administered 2018-10-29 – 2018-10-31 (×5): 40 mg via INTRAVENOUS
  Filled 2018-10-29 (×5): qty 4

## 2018-10-29 NOTE — Progress Notes (Signed)
[facility] - [place] [facility]   PATIENT NAME: Dylan [name]    MR#:  [id number]  DATE OF BIRTH:  June 20, 1936  SUBJECTIVE:  CHIEF COMPLAINT:   Chief Complaint  Patient presents with  . Shortness of Breath   -Sleepy, easily arousable and oriented -Refuses to go to rehab.  Breathing is improving  REVIEW OF SYSTEMS:  Review of Systems  Constitutional: Positive for malaise/fatigue. Negative for chills and fever.  HENT: Negative for congestion, ear discharge, hearing loss and nosebleeds.   Respiratory: Positive for shortness of breath. Negative for cough and wheezing.   Cardiovascular: Positive for leg swelling. Negative for chest pain and palpitations.  Gastrointestinal: Negative for abdominal pain, constipation, diarrhea, nausea and vomiting.  Genitourinary: Negative for dysuria.  Musculoskeletal: Negative for myalgias.  Neurological: Negative for dizziness, focal weakness, seizures, weakness and headaches.  Psychiatric/Behavioral: Negative for depression.    DRUG ALLERGIES:  No Known Allergies  VITALS:  Blood pressure 138/74, pulse 60, temperature 98.3 F (36.8 C), temperature source Oral, resp. rate 20, height 6' (1.829 m), weight 133 kg, SpO2 100 %.  PHYSICAL EXAMINATION:  Physical Exam  GENERAL:  83 y.o.-year-old obese patient lying in the bed with no acute distress.  EYES: Pupils equal, round, reactive to light and accommodation. No scleral icterus. Extraocular muscles intact.  HEENT: Head atraumatic, normocephalic. Oropharynx and nasopharynx clear.  NECK:  Supple, no jugular venous distention. No thyroid enlargement, no tenderness.  LUNGS: Normal breath sounds bilaterally, definitely decreased at the bases.  No wheezing, rales,rhonchi or crepitation. No use of accessory muscles of respiration.  CARDIOVASCULAR: S1, S2 normal. No  rubs, or gallops.  2/6 systolic murmur is present ABDOMEN: Soft, nontender, nondistended. Bowel sounds present. No  organomegaly or mass.  EXTREMITIES: No  cyanosis, or clubbing. 2+ lower extremity edema noted, chronic skin discoloration NEUROLOGIC: Cranial nerves II through XII are intact. Muscle strength 5/5 in all extremities. Sensation intact. Gait not checked.  PSYCHIATRIC: The patient is alert and oriented x 3.  SKIN: No obvious rash, lesion, or ulcer.    LABORATORY PANEL:   CBC Recent Labs  Lab 10/28/18 0402  WBC 9.2  HGB 11.7*  HCT 38.4*  PLT 201   ------------------------------------------------------------------------------------------------------------------  Chemistries  Recent Labs  Lab 10/28/18 0402  NA 144  K 3.5  CL 102  CO2 31  GLUCOSE 122*  BUN 26*  CREATININE 0.91  CALCIUM 8.9   ------------------------------------------------------------------------------------------------------------------  Cardiac Enzymes No results for input(s): TROPONINI in the last 168 hours. ------------------------------------------------------------------------------------------------------------------  RADIOLOGY:  Dg Chest 2 View  Result Date: 10/29/2018 CLINICAL DATA:  CHF pneumonia EXAM: CHEST - 2 VIEW COMPARISON:  10/27/2018 FINDINGS: AP and lateral views of the chest are limited by positioning. Low lung volumes. The cardio pericardial silhouette is enlarged. Persistent bibasilar collapse/consolidation, left greater than right with bilateral moderate pleural effusions. Interstitial pulmonary edema pattern persists. The visualized bony structures of the thorax are intact. Telemetry leads overlie the chest. IMPRESSION: No substantial change in exam. Electronically Signed   By: Kennith Center M.D.   On: 10/29/2018 15:54    EKG:   Orders placed or performed during the hospital encounter of 10/27/18  . ED EKG  . ED EKG    ASSESSMENT AND PLAN:   83 year old male with past medical history significant for systolic CHF with EF of 30% not on home oxygen, COPD, sleep apnea, hypertension,  CAD with a left knee surgery recently and has been at [facility] rehab was brought to  hospital secondary to difficulty breathing.  1.  Acute on chronic systolic CHF exacerbation-BNP is significantly elevated -Negative procalcitonin, chest x-ray with bilateral pleural effusions and pulmonary edema -Patient refused thoracentesis.  Last echocardiogram with EF of 30 to 35% and four-chamber dilatation. -Increase Lasix dose to 40 IV every 8 hours today.  Continue to monitor strict input and output, daily weights -Sodium restriction -Patient will also need to be compliant with his CPAP -Currently needing 2 L oxygen acutely, will continue -Discontinue Rocephin.  Continue doxycycline for 5 days and stop  2.  CAD, CHF-continue cardiac medications.  Patient done Entresto, Aldactone, Imdur, Plavix and carvedilol  3.  Neuropathy-continue gabapentin  4.  DVT prophylaxis-subcutaneous heparin  Worked with physical therapy, recommended rehab.  Patient interested in going home.  Continue to encourage ambulation   All the records are reviewed and case discussed with Care Management/Social Workerr. Management plans discussed with the patient, family and they are in agreement.  CODE STATUS: Full Code  TOTAL TIME TAKING CARE OF THIS PATIENT: 38 minutes.   POSSIBLE D/C IN 1-2 DAYS, DEPENDING ON CLINICAL CONDITION.   Enid Baas M.D on 10/29/2018 at 4:08 PM  Between 7am to 6pm - Pager - [phone]  After 6pm go to [email or web address] - Social research officer, government  [facility]  Office  [phone]  CC: Primary care physician; [name], MD

## 2018-10-29 NOTE — [place] FL2 (Signed)
[place] MEDICAID FL2 LEVEL OF CARE SCREENING TOOL     IDENTIFICATION  Patient Name: Dylan [name] Birthdate: [birth year] Sex: male Admission Date (Current Location): 10/27/2018  [place] and [place] Number:  Chiropodist and Address:  [facility], [address] [place], [place] [address]      Provider Number: [id number]  Attending Physician Name and Address:  Enid Baas, MD  Relative Name and Phone Number:       Current Level of Care: Hospital Recommended Level of Care: Skilled Nursing Facility Prior Approval Number:    Date Approved/Denied:   PASRR Number: [id number]  Discharge Plan: SNF    Current Diagnoses: Patient Active Problem List   Diagnosis Date Noted  . Acute on chronic systolic CHF (congestive heart failure) (HCC) 10/27/2018  . HCAP (healthcare-associated pneumonia) 10/27/2018  . Hypokalemia 10/01/2018  . Hypertensive heart and kidney disease with acute on chronic diastolic congestive heart failure and stage 3 chronic kidney disease (HCC) 09/09/2018  . Chronic constipation 09/09/2018  . BPH with obstruction/lower urinary tract symptoms 09/09/2018  . Obstructive sleep apnea of adult 09/09/2018  . Idiopathic peripheral neuropathy 09/09/2018  . Chronic kidney disease, stage 3, mod decreased GFR (HCC) 09/09/2018  . Anemia of chronic renal failure, stage 3 (moderate) (HCC) 09/09/2018  . Periprosthetic fracture around internal prosthetic left knee joint 08/26/2018  . Acute ST elevation myocardial infarction (STEMI) of inferior wall (HCC) 06/21/2018  . S/P total knee arthroplasty 06/01/2018  . Primary osteoarthritis of left knee 02/05/2018  . Sepsis (HCC) 01/10/2018  . Community acquired pneumonia 12/01/2017  . Pneumonia 12/01/2017  . Hyperlipidemia 09/23/2017  . Weakness 08/31/2017  . Benign essential tremor 10/18/2016  . Class 3 severe obesity due to excess calories with serious comorbidity and body mass index  (BMI) of 45.0 to 49.9 in adult (HCC) 10/18/2016  . Varicose veins of lower extremities with ulcer (HCC) 06/17/2016  . Chronic venous insufficiency 06/17/2016  . Lymphedema 06/17/2016  . Acute on chronic diastolic heart failure (HCC) 06/10/2016  . COPD, severe (HCC) 05/24/2016  . AV block, [place] 1 02/09/2016  . Coronary atherosclerosis of native coronary artery 02/09/2016  . ST elevation MI (STEMI) (HCC) 02/08/2016  . Right foot drop 04/07/2014  . Rotator cuff rupture, complete 04/22/2013  . Neck pain 04/14/2013    Orientation RESPIRATION BLADDER Height & Weight     Self, Time, Place  O2(2 liters ) Continent Weight: 293 lb 3.2 oz (133 kg) Height:  6' (182.9 cm)  BEHAVIORAL SYMPTOMS/MOOD NEUROLOGICAL BOWEL NUTRITION STATUS  (none) (none) Incontinent Diet(Heart Healthy )  AMBULATORY STATUS COMMUNICATION OF NEEDS Skin   Extensive Assist Verbally Other (Comment)(Stage 2 on coccyx )                       Personal Care Assistance Level of Assistance  Bathing, Feeding, Dressing Bathing Assistance: Limited assistance Feeding assistance: Independent Dressing Assistance: Limited assistance     Functional Limitations Info  Sight, Hearing, Speech Sight Info: Adequate Hearing Info: Adequate Speech Info: Adequate    SPECIAL CARE FACTORS FREQUENCY  PT (By licensed PT), OT (By licensed OT)     PT Frequency: 5 OT Frequency: 5            Contractures Contractures Info: Not present    Additional Factors Info  Code Status, Allergies Code Status Info: Full Code  Allergies Info: NKA           Current Medications (10/29/2018):  This is the current hospital active medication list Current Facility-Administered Medications  Medication Dose Route Frequency Provider Last Rate Last Dose  . 0.9 %  sodium chloride infusion   Intravenous PRN Alford Highland, MD   Stopped at 10/28/18 1422  . acetaminophen (TYLENOL) tablet 1,000 mg  1,000 mg Oral BID PRN Altamese Dilling, MD    1,000 mg at 10/28/18 0111  . alum & mag hydroxide-simeth (MAALOX/MYLANTA) 200-200-20 MG/5ML suspension 30 mL  30 mL Oral Q4H PRN Altamese Dilling, MD      . aspirin EC tablet 81 mg  81 mg Oral Daily Altamese Dilling, MD   81 mg at 10/29/18 0903  . atorvastatin (LIPITOR) tablet 80 mg  80 mg Oral Daily Altamese Dilling, MD   80 mg at 10/28/18 1741  . bisacodyl (DULCOLAX) suppository 10 mg  10 mg Rectal PRN Altamese Dilling, MD      . carvedilol (COREG) tablet 6.25 mg  6.25 mg Oral BID WC Altamese Dilling, MD   6.25 mg at 10/29/18 0903  . cefTRIAXone (ROCEPHIN) 2 g in sodium chloride 0.9 % 100 mL IVPB  2 g Intravenous Q24H Alford Highland, MD   Stopped at 10/28/18 1816  . Chlorhexidine Gluconate Cloth 2 % PADS 6 each  6 each Topical Q0600 Altamese Dilling, MD   6 each at 10/29/18 [phone]  . cholecalciferol (VITAMIN D3) tablet 1,000 Units  1,000 Units Oral Daily Altamese Dilling, MD   1,000 Units at 10/29/18 [phone]  . clopidogrel (PLAVIX) tablet 75 mg  75 mg Oral Daily Altamese Dilling, MD   75 mg at 10/29/18 0903  . guaiFENesin (MUCINEX) 12 hr tablet 600 mg  600 mg Oral BID PRN Altamese Dilling, MD       And  . dextromethorphan (DELSYM) 30 MG/5ML liquid 30 mg  30 mg Oral BID PRN Altamese Dilling, MD      . docusate sodium (COLACE) capsule 100 mg  100 mg Oral BID PRN Altamese Dilling, MD      . doxycycline (VIBRA-TABS) tablet 100 mg  100 mg Oral Q12H Alford Highland, MD   100 mg at 10/29/18 5409  . furosemide (LASIX) injection 20 mg  20 mg Intravenous Q8H Altamese Dilling, MD   20 mg at 10/29/18 0658  . gabapentin (NEURONTIN) capsule 400 mg  400 mg Oral QHS Altamese Dilling, MD   400 mg at 10/28/18 2345  . heparin injection 5,000 Units  5,000 Units Subcutaneous Q8H Altamese Dilling, MD   5,000 Units at 10/28/18 1617  . ipratropium-albuterol (DUONEB) 0.5-2.5 (3) MG/3ML nebulizer solution 3 mL  3 mL Nebulization Q6H PRN  Altamese Dilling, MD   3 mL at 10/28/18 0443  . isosorbide mononitrate (IMDUR) 24 hr tablet 30 mg  30 mg Oral Daily Altamese Dilling, MD   30 mg at 10/29/18 0904  . ketotifen (ZADITOR) 0.025 % ophthalmic solution 1 drop  1 drop Both Eyes BID Altamese Dilling, MD   1 drop at 10/29/18 0904  . loratadine (CLARITIN) tablet 10 mg  10 mg Oral Daily PRN Altamese Dilling, MD      . magnesium hydroxide (MILK OF MAGNESIA) suspension 30 mL  30 mL Oral Q4H PRN Altamese Dilling, MD      . Melatonin TABS 5 mg  5 mg Oral QHS Altamese Dilling, MD   5 mg at 10/29/18 0036  . multivitamin with minerals tablet 1 tablet  1 tablet Oral Daily Altamese Dilling, MD   1 tablet at 10/29/18 [phone]  . mupirocin ointment (  BACTROBAN) 2 % 1 application  1 application Nasal BID Altamese Dilling, MD   1 application at 10/29/18 [phone]  . nitroGLYCERIN (NITROSTAT) SL tablet 0.4 mg  0.4 mg Sublingual Q5 min PRN Altamese Dilling, MD      . nystatin ointment (MYCOSTATIN) 1 application  1 application Topical QID Altamese Dilling, MD   1 application at 10/29/18 [phone]  . oxyCODONE (Oxy IR/ROXICODONE) immediate release tablet 5 mg  5 mg Oral Q4H PRN Mayo, Allyn Kenner, MD   5 mg at 10/29/18 0311  . polyethylene glycol (MIRALAX / GLYCOLAX) packet 17 g  17 g Oral BID PRN Altamese Dilling, MD      . potassium chloride SA (K-DUR,KLOR-CON) CR tablet 20 mEq  20 mEq Oral BID Altamese Dilling, MD   20 mEq at 10/29/18 0902  . primidone (MYSOLINE) tablet 200 mg  200 mg Oral BID Altamese Dilling, MD   200 mg at 10/29/18 0902  . saccharomyces boulardii (FLORASTOR) capsule 250 mg  250 mg Oral BID Altamese Dilling, MD   250 mg at 10/29/18 0902  . sacubitril-valsartan (ENTRESTO) 24-26 mg per tablet  1 tablet Oral BID Altamese Dilling, MD   1 tablet at 10/29/18 0903  . senna-docusate (Senokot-S) tablet 2 tablet  2 tablet Oral BID PRN Altamese Dilling, MD      .  spironolactone (ALDACTONE) tablet 12.5 mg  12.5 mg Oral Daily Altamese Dilling, MD   12.5 mg at 10/29/18 0903  . tamsulosin (FLOMAX) capsule 0.4 mg  0.4 mg Oral Daily Altamese Dilling, MD   0.4 mg at 10/29/18 0903  . umeclidinium-vilanterol (ANORO ELLIPTA) 62.5-25 MCG/INH 1 puff  1 puff Inhalation Daily Altamese Dilling, MD   1 puff at 10/29/18 0904  . vitamin C (ASCORBIC ACID) tablet 500 mg  500 mg Oral Daily Altamese Dilling, MD   500 mg at 10/29/18 3013     Discharge Medications: Please see discharge summary for a list of discharge medications.  Relevant Imaging Results:  Relevant Lab Results:   Additional Information SSN: [id number]  Ruthe Mannan, [place]

## 2018-10-30 LAB — BASIC METABOLIC PANEL
Anion gap: 7 (ref 5–15)
BUN: 27 mg/dL — ABNORMAL HIGH (ref 8–23)
CALCIUM: 8.5 mg/dL — AB (ref 8.9–10.3)
CO2: 36 mmol/L — ABNORMAL HIGH (ref 22–32)
Chloride: 103 mmol/L (ref 98–111)
Creatinine, Ser: 1.02 mg/dL (ref 0.61–1.24)
GFR calc Af Amer: >60 mL/min (ref >60)
GFR calc non Af Amer: >60 mL/min (ref >60)
Glucose, Bld: 100 mg/dL — ABNORMAL HIGH (ref 70–99)
Potassium: 3.6 mmol/L (ref 3.5–5.1)
Sodium: 146 mmol/L — ABNORMAL HIGH (ref 135–145)

## 2018-10-30 LAB — BLOOD GAS, ARTERIAL
Acid-Base Excess: 15.8 mmol/L — ABNORMAL HIGH (ref 0.0–2.0)
Bicarbonate: 40.2 mmol/L — ABNORMAL HIGH (ref 20.0–28.0)
FIO2: 0.28
O2 Saturation: 99.2 %
Patient temperature: 37
pCO2 arterial: 46 mmHg (ref 32.0–48.0)
pH, Arterial: 7.55 — ABNORMAL HIGH (ref 7.350–7.450)
pO2, Arterial: 124 mmHg — ABNORMAL HIGH (ref 83.0–108.0)

## 2018-10-30 LAB — PROCALCITONIN: Procalcitonin: <0.1 ng/mL

## 2018-10-30 MED ORDER — LORAZEPAM 2 MG/ML IJ SOLN
1.0000 mg | Freq: Once | INTRAMUSCULAR | Status: AC
  Administered 2018-10-30: 04:00:00 1 mg via INTRAVENOUS
  Filled 2018-10-30: qty 1

## 2018-10-30 NOTE — Plan of Care (Signed)
Pt weaned to room air O2 stats holding 90% and above per order ramifications

## 2018-10-30 NOTE — Care Management Note (Signed)
Case Management Note  Patient Details  Name: Dylan [name] MRN: [id number] Date of Birth: 01/06/1936  Subjective/Objective:       Admitted to [facility] with the diagnosis of Acute on chronic systolic CHF. Wife id Dylan [name] [phone]). Sees Dr. Arlana Pouch as primary care physician. Prescriptions are filled at [facility] in [facility] with [facility] in the past. [facility] 09/04/2018. Oxygen concentrator, CPAP, wheelchair, rolling walker, and bedside commode in the home.  Self feed, needs help with baths and dressing. Spoke with wife, Dylan [name], Agency is coming out of [place] to help with care 24/7. Requested [facility] for transportation.             Action/Plan: Requested to go home with home health/physical therapy when stable. Would like [facility]. Will update Mellissa Kohut, [facility] representative. [facility] query done. One copy given to patient. One copy placed on chart.    Expected Discharge Date:                  Expected Discharge Plan:     In-House Referral:   yes  Discharge planning Services   yes  Post Acute Care Choice:   yes Choice offered to:   family/patient  DME Arranged:    DME Agency:     HH Arranged:   yes HH Agency:   [facility]  Status of Service:     If discussed at [employer], dates discussed:    Additional Comments:  Gwenette Greet, RN MSN CCM Care Management [phone] 10/30/2018, 8:36 AM

## 2018-10-30 NOTE — Care Management Important Message (Signed)
Important Message  Patient Details  Name: Dylan [name] MRN: [id number] Date of Birth: May 20, 1936   Medicare Important Message Given:  Yes    Gwenette Greet, RN 10/30/2018, 1:21 PM

## 2018-10-30 NOTE — Progress Notes (Signed)
[facility] [place] [facility]   PATIENT NAME: Dylan [name]    MR#:  [id number]  DATE OF BIRTH:  [birth year]  SUBJECTIVE:  CHIEF COMPLAINT:   Chief Complaint  Patient presents with  . Shortness of Breath   -Sleepy, easily arousable and oriented -Continues to refuse to go to rehab.  Unable to work much with physical therapy.  REVIEW OF SYSTEMS:  Review of Systems  Constitutional: Positive for malaise/fatigue. Negative for chills and fever.  HENT: Negative for congestion, ear discharge, hearing loss and nosebleeds.   Respiratory: Positive for shortness of breath. Negative for cough and wheezing.   Cardiovascular: Positive for leg swelling. Negative for chest pain and palpitations.  Gastrointestinal: Negative for abdominal pain, constipation, diarrhea, nausea and vomiting.  Genitourinary: Negative for dysuria.  Musculoskeletal: Negative for myalgias.  Neurological: Negative for dizziness, focal weakness, seizures, weakness and headaches.  Psychiatric/Behavioral: Negative for depression.    DRUG ALLERGIES:  No Known Allergies  VITALS:  Blood pressure (!) 142/87, pulse 80, temperature 98.3 F (36.8 C), temperature source Oral, resp. rate 18, height 6' (1.829 m), weight 129.6 kg, SpO2 100 %.  PHYSICAL EXAMINATION:  Physical Exam  GENERAL:  83 y.o.-year-old obese patient lying in the bed with no acute distress.  EYES: Pupils equal, round, reactive to light and accommodation. No scleral icterus. Extraocular muscles intact.  HEENT: Head atraumatic, normocephalic. Oropharynx and nasopharynx clear.  NECK:  Supple, no jugular venous distention. No thyroid enlargement, no tenderness.  LUNGS: Normal breath sounds bilaterally, definitely decreased at the bases.  No wheezing, rales,rhonchi or crepitation. No use of accessory muscles of respiration.  CARDIOVASCULAR: S1, S2 normal. No  rubs, or gallops.  2/6 systolic murmur is present ABDOMEN: Soft, nontender,  nondistended. Bowel sounds present. No organomegaly or mass.  EXTREMITIES: No  cyanosis, or clubbing. 2+ lower extremity edema noted, chronic skin discoloration NEUROLOGIC: Cranial nerves II through XII are intact. Muscle strength 5/5 in all extremities. Sensation intact. Gait not checked.  PSYCHIATRIC: The patient is alert and oriented x 3.  SKIN: No obvious rash, lesion, or ulcer.    LABORATORY PANEL:   CBC Recent Labs  Lab 10/28/18 0402  WBC 9.2  HGB 11.7*  HCT 38.4*  PLT 201   ------------------------------------------------------------------------------------------------------------------  Chemistries  Recent Labs  Lab 10/30/18 0456  NA 146*  K 3.6  CL 103  CO2 36*  GLUCOSE 100*  BUN 27*  CREATININE 1.02  CALCIUM 8.5*   ------------------------------------------------------------------------------------------------------------------  Cardiac Enzymes No results for input(s): TROPONINI in the last 168 hours. ------------------------------------------------------------------------------------------------------------------  RADIOLOGY:  Dg Chest 2 View  Result Date: 10/29/2018 CLINICAL DATA:  CHF pneumonia EXAM: CHEST - 2 VIEW COMPARISON:  10/27/2018 FINDINGS: AP and lateral views of the chest are limited by positioning. Low lung volumes. The cardio pericardial silhouette is enlarged. Persistent bibasilar collapse/consolidation, left greater than right with bilateral moderate pleural effusions. Interstitial pulmonary edema pattern persists. The visualized bony structures of the thorax are intact. Telemetry leads overlie the chest. IMPRESSION: No substantial change in exam. Electronically Signed   By: Kennith Center M.D.   On: 10/29/2018 15:54    EKG:   Orders placed or performed during the hospital encounter of 10/27/18  . ED EKG  . ED EKG    ASSESSMENT AND PLAN:   83 year old male with past medical history significant for systolic CHF with EF of 30% not on home  oxygen, COPD, sleep apnea, hypertension, CAD with a left knee surgery recently and has  been at [facility] rehab was brought to hospital secondary to difficulty breathing.  1.  Acute on chronic systolic CHF exacerbation-BNP is significantly elevated -Negative procalcitonin, chest x-ray with bilateral pleural effusions and pulmonary edema -Patient refused thoracentesis.  Last echocardiogram with EF of 30 to 35% and four-chamber dilatation. -Increased Lasix dose to 40 IV every 8 hours.  Continue to monitor strict input and output, daily weights -Sodium restriction -Patient will also need to be compliant with his CPAP -Currently needing 2 L oxygen acutely, will continue -Discontinue Rocephin.  Continue doxycycline for 5 days and stop  2.  CAD, CHF-continue cardiac medications.  Patient done Entresto, Aldactone, Imdur, Plavix and carvedilol  3.  Neuropathy-continue gabapentin  4.  DVT prophylaxis-subcutaneous heparin  5.  Sleep apnea-likely has underlying sleep apnea, tried CPAP last night however patient could not tolerate it -More sleepy now, could be from not having enough sleep last night.  However will get ABG  Worked with physical therapy, recommended rehab.  Patient interested in going home.  Continue to encourage ambulation Very weak, will need Hoyer lift and hospital bed at discharge   All the records are reviewed and case discussed with Care Management/Social Workerr. Management plans discussed with the patient, family and they are in agreement.  CODE STATUS: Full Code  TOTAL TIME TAKING CARE OF THIS PATIENT: 41 minutes.   POSSIBLE D/C IN 1-2 DAYS, DEPENDING ON CLINICAL CONDITION.   Enid Baas M.D on 10/30/2018 at 1:45 PM  Between 7am to 6pm - Pager - [phone]  After 6pm go to [email or web address] - Social research officer, government  [facility]  Office  [phone]  CC: Primary care physician; [name], MD

## 2018-10-30 NOTE — Progress Notes (Signed)
Physical Therapy Treatment Patient Details Name: Dylan [name] MRN: [id number] DOB: 10/14/1935 Today's Date: 10/30/2018    History of Present Illness Pt is an 83 y.o. male presenting to hospital 10/27/18 with SOB (came from STR).  Pt admitted with HCAP, acute on chronic systolic CHF, HLD, htn, and COPD.  PMH includes ORIF L femur fx with quad repair 08/27/18; L TKA 06/01/18, CAD, cervicalgia, CHF, COPD, diastolic heart failure, R foot drop, htn, MI, L shoulder pain, h/o back surgery, tremor, L rotator cuff injury.    PT Comments    Pt received in supine, O2 donned, soup spilled on gown, sister in room. Pt agreeable to participate. Total Assist for all bed mobility. Pt has difficulty sitting at EOB, but modA to come to neutral posture, VC for hand holds, fatigues after ~3 minutes each time. MaxA given for attempted STS transfer, but pt unable to successfully rise despite fervent efforts. Pt remains severely weak and deconditioned, will need extensive rehab process to restore to AMB functional level. PT still recommending high frequency PT services available in STR/SNF setting, however, pt/family have financial road blocks and desire to return to home. Rehab frequency has been limited recently for unclear reasons (per family) but pt is able to participate more openly now that weight bearing restrictions have downgraded to WBAT. Should pt return to home he will need a mechanical lift for mobility needs.   Follow Up Recommendations  SNF     Equipment Recommendations  (will need a mechnical lift at home should he DC home. )    Recommendations for Other Services       Precautions / Restrictions Precautions Precautions: Fall Required Braces or Orthoses: Other Brace Other Brace: Out of brace for ROM and strengthening (per MD Rosita Kea 10/28/18); locked in TKE (zero degrees) for WBAT in stance.  Restrictions LLE Weight Bearing: Weight bearing as tolerated Other Position/Activity Restrictions: L LE WBAT  with brace locked in extension (per MD Rosita Kea 10/28/18)    Mobility  Bed Mobility Overal bed mobility: Needs Assistance Bed Mobility: Supine to Sit;Sit to Supine     Supine to sit: +2 for physical assistance;HOB elevated;Total assist Sit to supine: Total assist;+2 for physical assistance   General bed mobility comments: TotalA for lateral scooting while seated; modA for lateral scooting while supine; Limited capacity for sitting independently  Transfers Overall transfer level: Needs assistance Equipment used: 1 person hand held assist             General transfer comment: Attempted STS 5x from EOB 3x MaxA+1, unable to raise; pt too fatigued to continue to try, verbalizes fearful of sliding into floor.   Ambulation/Gait                 Stairs             Wheelchair Mobility    Modified Rankin (Stroke Patients Only)       Balance Overall balance assessment: Needs assistance   Sitting balance-Leahy Scale: Poor(tolerates sitting c 1UE 5x59minutes )                                      Cognition Arousal/Alertness: Awake/alert(mildly drowsy, limited sleep last night) Behavior During Therapy: WFL for tasks assessed/performed Overall Cognitive Status: Within Functional Limits for tasks assessed  Exercises Other Exercises Other Exercises: several instances of independent sitting with single UE support on rail x3 minutes, requires modA for postural correction back to neutral.     General Comments        Pertinent Vitals/Pain Pain Assessment: No/denies pain    Home Living                      Prior Function            PT Goals (current goals can now be found in the care plan section) Acute Rehab PT Goals Patient Stated Goal: to improve mobility PT Goal Formulation: With patient Time For Goal Achievement: 11/11/18 Potential to Achieve Goals: Fair Progress towards PT  goals: Not progressing toward goals - comment    Frequency    Min 2X/week      PT Plan Current plan remains appropriate    Co-evaluation              AM-PAC PT "6 Clicks" Mobility   Outcome Measure  Help needed turning from your back to your side while in a flat bed without using bedrails?: Total Help needed moving from lying on your back to sitting on the side of a flat bed without using bedrails?: Total Help needed moving to and from a bed to a chair (including a wheelchair)?: Total Help needed standing up from a chair using your arms (e.g., wheelchair or bedside chair)?: Total Help needed to walk in hospital room?: Total Help needed climbing 3-5 steps with a railing? : Total 6 Click Score: 6    End of Session Equipment Utilized During Treatment: Oxygen;Left knee immobilizer Activity Tolerance: Patient limited by fatigue;Patient limited by pain Patient left: in bed;with call bell/phone within reach;with bed alarm set Nurse Communication: Mobility status;Precautions;Weight bearing status;Other (comment) PT Visit Diagnosis: Other abnormalities of gait and mobility (R26.89);Muscle weakness (generalized) (M62.81);History of falling (Z91.81);Difficulty in walking, not elsewhere classified (R26.2)     Time: 6237-6283 PT Time Calculation (min) (ACUTE ONLY): 47 min  Charges:  $Therapeutic Activity: 38-52 mins                     2:47 PM, 10/30/18 Rosamaria Lints, PT, DPT Physical Therapist - [facility]  [phone] (ASCOM)     , C 10/30/2018, 2:41 PM

## 2018-10-31 LAB — BASIC METABOLIC PANEL
Anion gap: 8 (ref 5–15)
BUN: 27 mg/dL — ABNORMAL HIGH (ref 8–23)
CHLORIDE: 101 mmol/L (ref 98–111)
CO2: 36 mmol/L — ABNORMAL HIGH (ref 22–32)
CREATININE: 0.98 mg/dL (ref 0.61–1.24)
Calcium: 8.4 mg/dL — ABNORMAL LOW (ref 8.9–10.3)
GFR calc Af Amer: >60 mL/min (ref >60)
GFR calc non Af Amer: >60 mL/min (ref >60)
GLUCOSE: 97 mg/dL (ref 70–99)
Potassium: 3.5 mmol/L (ref 3.5–5.1)
Sodium: 145 mmol/L (ref 135–145)

## 2018-10-31 MED ORDER — FUROSEMIDE 20 MG PO TABS: 60.0000 mg | ORAL_TABLET | Freq: Two times a day (BID) | ORAL | 0 refills | Status: DC

## 2018-10-31 MED ORDER — BENZONATATE 100 MG PO CAPS: 200.0000 mg | ORAL_CAPSULE | Freq: Three times a day (TID) | ORAL | 0 refills | Status: DC | PRN

## 2018-10-31 MED ORDER — DEXTROMETHORPHAN POLISTIREX ER 30 MG/5ML PO SUER: 30.0000 mg | Freq: Two times a day (BID) | ORAL | 0 refills | Status: DC | PRN

## 2018-10-31 MED ORDER — AMOXICILLIN-POT CLAVULANATE 500-125 MG PO TABS: 1.0000 | ORAL_TABLET | Freq: Two times a day (BID) | ORAL | 0 refills | Status: AC

## 2018-10-31 NOTE — Care Management (Addendum)
Patient suffers from CHF and has trouble breathing when head of bed is less than 30 degrees. Bed wedges do not provide enough elevation to resolve breathing issues. Shortness of breath and hypoxic cause patient to require frequent/immediate changes in body position which cannot be achieve with a normal bed.

## 2018-10-31 NOTE — Discharge Summary (Addendum)
[facility] [place] [facility]   PATIENT NAME: Dylan [name]    MR#:  [id number]  DATE OF BIRTH:  05/31/1936  DATE OF ADMISSION:  10/27/2018 ADMITTING PHYSICIAN: Altamese Dilling, MD  DATE OF DISCHARGE: No discharge date for patient encounter.  PRIMARY CARE PHYSICIAN: [name], MD    ADMISSION DIAGNOSIS:  Pleural effusion, bilateral [J90] Dyspnea, unspecified type [R06.00]  DISCHARGE DIAGNOSIS:  Principal Problem:   Acute on chronic systolic CHF (congestive heart failure) (HCC) Active Problems:   HCAP (healthcare-associated pneumonia)   SECONDARY DIAGNOSIS:   Past Medical History:  Diagnosis Date  . CAD (coronary artery disease)   . Cervicalgia   . CHF (congestive heart failure) (HCC)   . COPD (chronic obstructive pulmonary disease) (HCC)   . Diastolic heart failure (HCC)   . Foot drop, right   . History of kidney stones   . Hyperlipidemia    unspecified  . Hypertension   . Myocardial infarction (HCC)   . Osteoarthritis   . Shoulder pain, left   . Sleep apnea   . Tremor, essential     HOSPITAL COURSE:    83 year old male with past medical history significant for systolic CHF with EF of 30% not on home oxygen, COPD, sleep apnea, hypertension, CAD with a left knee surgery recently and has been at [facility] rehab was brought to hospital secondary to difficulty breathing.  * Acute on chronic systolic CHF exacerbation Resolved BNP is significantly elevated Negative procalcitonin, chest x-ray with bilateral pleural effusions and pulmonary edema Patient refused thoracentesis.  Last echocardiogram with EF of 30 to 35% and four-chamber dilatation. Treated on our congestive heart failure protocol, Lasix increased while in house spironolactone, Sodium restriction Patient will also need to be compliant with his CPAP Currently needing 1 L oxygen acutely Treated with empiric doxycycline while in house  *CAD, CHF Stable continue  cardiac medications- Entresto, Aldactone, Imdur, Plavix and carvedilol  *Neuropathy continue gabapentin  *Sleep apnea tried CPAP while in house- could not tolerate it  Worked with physical therapy, recommended rehab.  Patient interested in going home-does not want to go to inpatient facility despite family concerns, family meeting had on the day of discharge with all questions answered, to go home with home health services-Case management assisted with disposition, for [facility] lift and hospital bed at discharge  DISCHARGE CONDITIONS:   stable  CONSULTS OBTAINED:    DRUG ALLERGIES:  No Known Allergies  DISCHARGE MEDICATIONS:   Allergies as of 10/31/2018   No Known Allergies     Medication List    TAKE these medications   acetaminophen 500 MG tablet Commonly known as:  TYLENOL Take 1,000 mg by mouth 2 (two) times daily as needed for moderate pain.   alum & mag hydroxide-simeth 400-400-40 MG/5ML suspension Commonly known as:  MAALOX PLUS Take 30 mLs by mouth every 4 (four) hours as needed for indigestion.   amoxicillin-clavulanate 500-125 MG tablet Commonly known as:  AUGMENTIN Take 1 tablet (500 mg total) by mouth 2 (two) times daily for 4 days.   aspirin EC 81 MG tablet Take 81 mg by mouth daily.   atorvastatin 80 MG tablet Commonly known as:  LIPITOR Take 80 mg by mouth daily.   benzonatate 100 MG capsule Commonly known as:  TESSALON PERLES Take 2 capsules (200 mg total) by mouth 3 (three) times daily as needed for cough.   bisacodyl 10 MG suppository Commonly known as:  DULCOLAX Place 10 mg rectally as  needed for moderate constipation.   carvedilol 6.25 MG tablet Commonly known as:  COREG Take 6.25 mg by mouth 2 (two) times daily with a meal.   CENTROVITE Tabs Take 1 tablet by mouth daily.   Cholecalciferol 100 MCG (4000 UT) Tabs Take 1 tablet by mouth daily.   clopidogrel 75 MG tablet Commonly known as:  PLAVIX Take 75 mg by mouth daily.    dextromethorphan 30 MG/5ML liquid Commonly known as:  DELSYM Take 5 mLs (30 mg total) by mouth 2 (two) times daily as needed for cough.   dextromethorphan-guaiFENesin 30-600 MG 12hr tablet Commonly known as:  MUCINEX DM Take 1 tablet by mouth 2 (two) times daily as needed for cough.   ENDIT EX Apply to bottom and moisture associated breakdown of coccyx area BID and as need   ENTRESTO 24-26 MG Generic drug:  sacubitril-valsartan Take 1 tablet by mouth 2 (two) times daily.   furosemide 20 MG tablet Commonly known as:  LASIX Take 3 tablets (60 mg total) by mouth 2 (two) times daily.   gabapentin 400 MG capsule Commonly known as:  NEURONTIN Take 400 mg by mouth at bedtime.   ipratropium-albuterol 0.5-2.5 (3) MG/3ML Soln Commonly known as:  DUONEB Take 3 mLs by nebulization every 6 (six) hours as needed (shortness of breath).   isosorbide mononitrate 30 MG 24 hr tablet Commonly known as:  IMDUR Take 30 mg by mouth daily.   ketotifen 0.025 % ophthalmic solution Commonly known as:  ZADITOR Place 1 drop into both eyes 2 (two) times daily.   loratadine 10 MG tablet Commonly known as:  CLARITIN Take 10 mg by mouth daily as needed for allergies.   magnesium hydroxide 400 MG/5ML suspension Commonly known as:  MILK OF MAGNESIA Take 30 mLs by mouth every 4 (four) hours as needed for mild constipation (No BM in 2 days).   Melatonin 3 MG Tabs Take 1 tablet by mouth at bedtime.   nitroGLYCERIN 0.3 MG SL tablet Commonly known as:  NITROSTAT Place 0.3 mg under the tongue every 5 (five) minutes as needed for chest pain.   nystatin ointment Commonly known as:  MYCOSTATIN Apply 1 application topically 4 (four) times daily. Apply to bilateral groin rashes   polyethylene glycol packet Commonly known as:  MIRALAX / GLYCOLAX Take 17 g by mouth 2 (two) times daily as needed. Hold for loose stools.   potassium chloride SA 20 MEQ tablet Commonly known as:  K-DUR,KLOR-CON Take 20 mEq  by mouth 2 (two) times daily.   primidone 50 MG tablet Commonly known as:  MYSOLINE Take 200 mg by mouth 2 (two) times daily.   saccharomyces boulardii 250 MG capsule Commonly known as:  FLORASTOR Take 250 mg by mouth 2 (two) times daily.   sennosides-docusate sodium 8.6-50 MG tablet Commonly known as:  SENOKOT-S Take 2 tablets by mouth 2 (two) times daily as needed for constipation.   spironolactone 25 MG tablet Commonly known as:  ALDACTONE Take 12.5 mg by mouth daily.   tamsulosin 0.4 MG Caps capsule Commonly known as:  FLOMAX Take 1 capsule (0.4 mg total) by mouth daily.   torsemide 20 MG tablet Commonly known as:  DEMADEX Take 40 mg by mouth daily.   umeclidinium-vilanterol 62.5-25 MCG/INH Aepb Commonly known as:  ANORO ELLIPTA Inhale 1 puff into the lungs daily.   VITAMIN C PO Take 500 mg by mouth daily.            Durable Medical Equipment  (From  admission, onward)         Start     Ordered   10/31/18 1129  For home use only DME Hospital bed  Once    Comments:  With [employer] lift  Question Answer Comment  The above medical condition requires: Patient requires the ability to reposition frequently   Head must be elevated greater than: 30 degrees   Bed type Semi-electric   Hoyer Lift Yes      10/31/18 1128           DISCHARGE INSTRUCTIONS:    If you experience worsening of your admission symptoms, develop shortness of breath, life threatening emergency, suicidal or homicidal thoughts you must seek medical attention immediately by calling 911 or calling your MD immediately  if symptoms less severe.  You Must read complete instructions/literature along with all the possible adverse reactions/side effects for all the Medicines you take and that have been prescribed to you. Take any new Medicines after you have completely understood and accept all the possible adverse reactions/side effects.   Please note  You were cared for by a hospitalist during  your hospital stay. If you have any questions about your discharge medications or the care you received while you were in the hospital after you are discharged, you can call the unit and asked to speak with the hospitalist on call if the hospitalist that took care of you is not available. Once you are discharged, your primary care physician will handle any further medical issues. Please note that NO REFILLS for any discharge medications will be authorized once you are discharged, as it is imperative that you return to your primary care physician (or establish a relationship with a primary care physician if you do not have one) for your aftercare needs so that they can reassess your need for medications and monitor your lab values.    Today   CHIEF COMPLAINT:   Chief Complaint  Patient presents with  . Shortness of Breath    HISTORY OF PRESENT ILLNESS:  83 y.o. male with a known history of coronary artery disease, CHF, COPD, systolic congestive heart failure with EF of 35%, foot drop on the right side chronically for many years due to back surgery, hyperlipidemia, hypertension, myocardial infarction, sleep apnea-chronically was walking with a walker and had a fall in Christmas 2019-admitted with femur fracture and after surgery was sent to [facility] for rehab.  He was also treated for pneumonia at that time.  At the time of discharge he was advised to have partial weightbearing and did not receive sufficient physical therapy at the rehab center as per the family. For last 2 to 3 days he has worsening in shortness of breath and some sweating episodes.  On x-ray the facility noted that he is having pneumonia so started on Augmentin and sent to emergency room for further review.  VITAL SIGNS:  Blood pressure 134/61, pulse (!) 58, temperature 98.3 F (36.8 C), temperature source Oral, resp. rate 15, height 6' (1.829 m), weight 125.6 kg, SpO2 93 %.  I/O:    Intake/Output Summary (Last 24  hours) at 10/31/2018 1330 Last data filed at 10/31/2018 0854 Gross per 24 hour  Intake -  Output 1150 ml  Net -1150 ml    PHYSICAL EXAMINATION:  GENERAL:  83 y.o.-year-old patient lying in the bed with no acute distress.  EYES: Pupils equal, round, reactive to light and accommodation. No scleral icterus. Extraocular muscles intact.  HEENT: Head atraumatic, normocephalic. Oropharynx  and nasopharynx clear.  NECK:  Supple, no jugular venous distention. No thyroid enlargement, no tenderness.  LUNGS: Normal breath sounds bilaterally, no wheezing, rales,rhonchi or crepitation. No use of accessory muscles of respiration.  CARDIOVASCULAR: S1, S2 normal. No murmurs, rubs, or gallops.  ABDOMEN: Soft, non-tender, non-distended. Bowel sounds present. No organomegaly or mass.  EXTREMITIES: No pedal edema, cyanosis, or clubbing.  NEUROLOGIC: Cranial nerves II through XII are intact. Muscle strength 5/5 in all extremities. Sensation intact. Gait not checked.  PSYCHIATRIC: The patient is alert and oriented x 3.  SKIN: No obvious rash, lesion, or ulcer.   DATA REVIEW:   CBC Recent Labs  Lab 10/28/18 0402  WBC 9.2  HGB 11.7*  HCT 38.4*  PLT 201    Chemistries  Recent Labs  Lab 10/31/18 0411  NA 145  K 3.5  CL 101  CO2 36*  GLUCOSE 97  BUN 27*  CREATININE 0.98  CALCIUM 8.4*    Cardiac Enzymes No results for input(s): TROPONINI in the last 168 hours.  Microbiology Results  Results for orders placed or performed during the hospital encounter of 10/27/18  Blood culture (routine x 2)     Status: None (Preliminary result)   Collection Time: 10/27/18  3:25 PM  Result Value Ref Range Status   Specimen Description BLOOD BLOOD RIGHT FOREARM  Final   Special Requests   Final    BOTTLES DRAWN AEROBIC AND ANAEROBIC Blood Culture adequate volume   Culture   Final    NO GROWTH 4 DAYS Performed at [facility], [address], [place], [place] [address]    Report Status PENDING   Incomplete  Blood culture (routine x 2)     Status: None (Preliminary result)   Collection Time: 10/27/18  9:40 PM  Result Value Ref Range Status   Specimen Description BLOOD LEFT ANTECUBITAL  Final   Special Requests   Final    BOTTLES DRAWN AEROBIC AND ANAEROBIC Blood Culture adequate volume   Culture   Final    NO GROWTH 4 DAYS Performed at [facility], [address] [place], [place] [address]    Report Status PENDING  Incomplete  MRSA PCR Screening     Status: Abnormal   Collection Time: 10/27/18 11:21 PM  Result Value Ref Range Status   MRSA by PCR POSITIVE (A) NEGATIVE Final    Comment:        The GeneXpert MRSA Assay (FDA approved for NASAL specimens only), is one component of a comprehensive MRSA colonization surveillance program. It is not intended to diagnose MRSA infection nor to guide or monitor treatment for MRSA infections. RESULT CALLED TO, READ BACK BY AND VERIFIED WITH: SLYVIA PRENTICE 10/28/2018 0042 REC Performed at [facility], [address] [place] [place], [place] [address]     RADIOLOGY:  Dg Chest 2 View  Result Date: 10/29/2018 CLINICAL DATA:  CHF pneumonia EXAM: CHEST - 2 VIEW COMPARISON:  10/27/2018 FINDINGS: AP and lateral views of the chest are limited by positioning. Low lung volumes. The cardio pericardial silhouette is enlarged. Persistent bibasilar collapse/consolidation, left greater than right with bilateral moderate pleural effusions. Interstitial pulmonary edema pattern persists. The visualized bony structures of the thorax are intact. Telemetry leads overlie the chest. IMPRESSION: No substantial change in exam. Electronically Signed   By: Kennith Center M.D.   On: 10/29/2018 15:54    EKG:   Orders placed or performed during the hospital encounter of 10/27/18  . ED EKG  .  ED EKG      Management plans discussed with the patient, family and they are in agreement.  CODE STATUS:     Code Status Orders  (From  admission, onward)         Start     Ordered   10/27/18 2102  Limited resuscitation (code)  Continuous    Question Answer Comment  In the event of cardiac or respiratory ARREST: Initiate Code Blue, Call Rapid Response Yes   In the event of cardiac or respiratory ARREST: Perform CPR Yes   In the event of cardiac or respiratory ARREST: Perform Intubation/Mechanical Ventilation No   In the event of cardiac or respiratory ARREST: Use NIPPV/BiPAp only if indicated Yes   In the event of cardiac or respiratory ARREST: Administer ACLS medications if indicated Yes   In the event of cardiac or respiratory ARREST: Perform Defibrillation or Cardioversion if indicated Yes      10/27/18 2101        Code Status History    Date Active Date Inactive Code Status Order ID Comments User Context   08/26/2018 1137 09/04/2018 1912 Full Code [id number]  Kennedy BuckerMenz, Michael, MD ED   06/21/2018 1321 06/24/2018 2008 Partial Code [id number]  Milagros LollSudini, Srikar, MD Inpatient   06/21/2018 0803 06/21/2018 1321 Full Code [id number]  Iran OuchArida, Muhammad A, MD Inpatient   06/01/2018 2009 06/04/2018 1718 Full Code [id number]  Donato HeinzHooten, James P, MD Inpatient   01/11/2018 0012 01/14/2018 1908 Full Code [id number]  Cammy CopaMaier, Angela, MD Inpatient   12/01/2017 1924 12/05/2017 1948 Full Code [id number]  Altamese DillingVachhani, Vaibhavkumar, MD Inpatient   08/31/2017 0557 09/05/2017 1959 Full Code [id number]  Arnaldo Nataliamond, Michael S, MD Inpatient   06/10/2016 1934 06/13/2016 1816 Full Code [id number]  Shaune Pollackhen, Qing, MD Inpatient   12/15/2015 0134 12/16/2015 1507 Full Code [id number]  Ihor AustinPyreddy, Pavan, MD ED    Advance Directive Documentation     Most Recent Value  Type of Advance Directive  Healthcare Power of Attorney  Pre-existing out of facility DNR order (yellow form or pink MOST form)  -  "MOST" Form in Place?  -      TOTAL TIME TAKING CARE OF THIS PATIENT: 45 minutes.    Evelena AsaMontell D [name] M.D on 10/31/2018 at 1:30 PM  Between 7am to 6pm - Pager - [phone]  After 6pm go  to [email or web address] - password [email or web address]  [facility] [place]  Office  [phone]  CC: Primary care physician; Jaclyn Shaggyate, [name], MD   Note: This dictation was prepared with Dragon dictation along with smaller phrase technology. Any transcriptional errors that result from this process are unintentional.

## 2018-10-31 NOTE — Care Management Note (Addendum)
Case Management Note  Patient Details  Name: Dylan [name] MRN: [id number] Date of Birth: February 01, 1936  Subjective/Objective:  Patient to be discharged per MD order. Orders in place for home health services. Patient was worked up for home health via [facility]. [name] from [facility] aware of referral. Hospital bed and hoyer lift ordered. DMe obtained from [facility]. Planned for delivery tomorrow, per [facility]. Patient has O2 in home. EMS for transport. Faxed information for outpatient palliative to [facility] [place].                   Action/Plan:   Expected Discharge Date:  10/31/18               Expected Discharge Plan:  Home w Home Health Services  In-House Referral:     Discharge planning Services  CM Consult  Post Acute Care Choice:  Home Health, Durable Medical Equipment Choice offered to:  Patient, Spouse  DME Arranged:  Hospital bed(and hoyer lift) DME Agency:  [facility]  HH Arranged:  RN, PT, Nurse's Aide HH Agency:  [facility]  Status of Service:  Completed, signed off  If discussed at [employer], dates discussed:    Additional Comments:  Bertrum Sol, MD 10/31/2018, 1:30 PM

## 2018-11-01 LAB — CULTURE, BLOOD (ROUTINE X 2)
Culture: NO GROWTH
Culture: NO GROWTH
Special Requests: ADEQUATE
Special Requests: ADEQUATE

## 2018-11-05 ENCOUNTER — Ambulatory Visit: Payer: Medicare Other | Admitting: Family

## 2018-11-09 ENCOUNTER — Other Ambulatory Visit: Payer: Medicare Other | Admitting: Primary Care

## 2018-11-09 DIAGNOSIS — G4733 Obstructive sleep apnea (adult) (pediatric): Secondary | ICD-10-CM

## 2018-11-09 DIAGNOSIS — N183 Chronic kidney disease, stage 3 (moderate): Secondary | ICD-10-CM

## 2018-11-09 DIAGNOSIS — R531 Weakness: Secondary | ICD-10-CM

## 2018-11-09 DIAGNOSIS — I5033 Acute on chronic diastolic (congestive) heart failure: Secondary | ICD-10-CM

## 2018-11-09 DIAGNOSIS — I130 Hypertensive heart and chronic kidney disease with heart failure and stage 1 through stage 4 chronic kidney disease, or unspecified chronic kidney disease: Secondary | ICD-10-CM

## 2018-11-09 DIAGNOSIS — E66813 Obesity, class 3: Secondary | ICD-10-CM

## 2018-11-09 DIAGNOSIS — Z6841 Body Mass Index (BMI) 40.0 and over, adult: Secondary | ICD-10-CM

## 2018-11-09 DIAGNOSIS — K5909 Other constipation: Secondary | ICD-10-CM

## 2018-11-09 DIAGNOSIS — Z515 Encounter for palliative care: Secondary | ICD-10-CM

## 2018-11-09 DIAGNOSIS — J189 Pneumonia, unspecified organism: Secondary | ICD-10-CM

## 2018-11-09 DIAGNOSIS — J449 Chronic obstructive pulmonary disease, unspecified: Secondary | ICD-10-CM

## 2018-11-09 NOTE — Progress Notes (Signed)
Therapist, nutritional Palliative Care Consult Note Telephone: [phone]  Fax: [phone]  PATIENT NAME: Dylan [name] DOB: Feb 19, 1936 MRN: [id number]  PRIMARY CARE PROVIDER:   [name], MD  REFERRING PROVIDER:  [name], MD [address]   [place], [place] [address]  RESPONSIBLE PARTY:   Extended Emergency Contact Information Primary Emergency Contact: Heron,[name] Address: [address]          [place], [place] [address] [place] Phone: [phone] Relation: Spouse Secondary Emergency Contact: Metzinger,[name]  [place] Home Phone: [phone] Mobile Phone: [phone] Relation: Son   Palliative Care was asked to see patient by consultation on request by Dr. Arlana Pouch. This is the initial consultation. Patient, sister and hired CNA were present.  ASSESSMENT and RECOMMENDATIONS:   1. Fall risk :  PT to begin today at home. Will  need aggressive rehabilitation to attain goals. Has not had wt bearing PT yet from femur fx in December. Non ambulatory now, for home PT to begin this afternoon Has only pivot transferred since coming home from rehab. Has a lift chair, hoyer lift. Wants to become more functional.Patient has trouble pulling self to sitting forward  in chair.                                                                                   2. Anxiety: Discussed [facility]States history of panic attacks after his poor health recently and death of brother.  Pt's sister is in touch with Bereavement. State that MD has called in a medication for anxiety. He does not have this yet. Would recommend short term use of alprazolam or  Lorazepam, and addressing depression/insomnia, see below. Would benefit from counseling but has been reticent.  3. Wound in Right first metatarsal, left pre tibia. Elevating LE will help with healing due to dependent edema as would light  compressing to decrease edema. This education was done. Wound is cared for by home health. I was present for RN visit and saw wounds. Right foot wound has 0.2 mm depth per home health RN.   4. Dyspnea: Recommend duoneb nebulizer treatments every   6 hrs scheduled around the clock. Currently prn. States extreme SOB episodes he equates with/has with  anxiety attacks. Is very emotionally dependent on oxygen. We did a trial of him sitting with no oxygen for > 15 minutes and no desaturation. At rest with and without oxygen, he saturated in high 90's.  Upon lung auscultation I discovered very poor air movement, and that he only uses albuterol nebs once a day. He may benefit from oxygen more with scheduled nebulizers.  6. Insomnia/fatigue. Recommend trazodone 50 mg   at hs for sleep and depression. Other factors for his dyspnea may be related to obesity and possible OSA, and depression with losses of health and his brother. He complains of poor sleep/ frequently awakening at night.  7. Goals of Care:  States he wants to continue to rehab, improve his function. Wife died some years ago on hospice. We discussed his brother's recent death as well. Discussed making  some advance directives but patient wants to wait on this. Will continue to clarify goals of care in light of his ongoing rehab and success therein.   Palliative to continue to follow for goals of care clarification and symptom management. Return 4 weeks or earlier if needed.   I spent 75 minutes providing this consultation,  from 1400 to 1500. More than 50% of the time in this consultation was spent coordinating communication.   HISTORY OF PRESENT ILLNESS:  MOZES [name] is a 83 y.o. year old male with multiple medical problems including CHF, recent left femur fracture, s/p left knee joint replacement, debility, obesity, anxiety disorder , PVD, CAD, s/p STEMI. Palliative Care was asked to help address goals of care.   CODE STATUS: FULL  PPS:  30% HOSPICE ELIGIBILITY/DIAGNOSIS: TBD  PAST MEDICAL HISTORY:  Past Medical History:  Diagnosis Date  . CAD (coronary artery disease)   . Cervicalgia   . CHF (congestive heart failure) (HCC)   . COPD (chronic obstructive pulmonary disease) (HCC)   . Diastolic heart failure (HCC)   . Foot drop, right   . History of kidney stones   . Hyperlipidemia    unspecified  . Hypertension   . Myocardial infarction (HCC)   . Osteoarthritis   . Shoulder pain, left   . Sleep apnea   . Tremor, essential     SOCIAL HX:  Social History   Tobacco Use  . Smoking status: Former Smoker    Packs/day: 0.25    Years: 58.00    Pack years: 14.50    Types: Cigarettes  . Smokeless tobacco: Former Neurosurgeon  . Tobacco comment: quit the begginning of septembet 2019  Substance Use Topics  . Alcohol use: No    Alcohol/week: 0.0 standard drinks    ALLERGIES: No Known Allergies   PERTINENT MEDICATIONS:  Outpatient Encounter Medications as of 11/09/2018  Medication Sig  . acetaminophen (TYLENOL) 500 MG tablet Take 1,000 mg by mouth 2 (two) times daily as needed for moderate pain.  Marland Kitchen alum & mag hydroxide-simeth (MAALOX PLUS) 400-400-40 MG/5ML suspension Take 30 mLs by mouth every 4 (four) hours as needed for indigestion.  . Ascorbic Acid (VITAMIN C PO) Take 500 mg by mouth daily.   Marland Kitchen aspirin EC 81 MG tablet Take 81 mg by mouth daily.  Marland Kitchen atorvastatin (LIPITOR) 80 MG tablet Take 80 mg by mouth daily.   . benzonatate (TESSALON PERLES) 100 MG capsule Take 2 capsules (200 mg total) by mouth 3 (three) times daily as needed for cough.  . bisacodyl (DULCOLAX) 10 MG suppository Place 10 mg rectally as needed for moderate constipation.  . carvedilol (COREG) 6.25 MG tablet Take 6.25 mg by mouth 2 (two) times daily with a meal.  . Cholecalciferol 100 MCG (4000 UT) TABS Take 1 tablet by mouth daily.  . clopidogrel (PLAVIX) 75 MG tablet Take 75 mg by mouth daily.  Marland Kitchen dextromethorphan (DELSYM) 30 MG/5ML liquid Take 5 mLs  (30 mg total) by mouth 2 (two) times daily as needed for cough.  . dextromethorphan-guaiFENesin (MUCINEX DM) 30-600 MG 12hr tablet Take 1 tablet by mouth 2 (two) times daily as needed for cough.  . furosemide (LASIX) 20 MG tablet Take 3 tablets (60 mg total) by mouth 2 (two) times daily.  Marland Kitchen gabapentin (NEURONTIN) 400 MG capsule Take 400 mg by mouth at bedtime.  Marland Kitchen ipratropium-albuterol (DUONEB) 0.5-2.5 (3) MG/3ML SOLN Take 3 mLs by nebulization every 6 (six) hours as needed (shortness of breath).   Marland Kitchen  isosorbide mononitrate (IMDUR) 30 MG 24 hr tablet Take 30 mg by mouth daily.   Marland Kitchen ketotifen (ZADITOR) 0.025 % ophthalmic solution Place 1 drop into both eyes 2 (two) times daily.  Marland Kitchen loratadine (CLARITIN) 10 MG tablet Take 10 mg by mouth daily as needed for allergies.  . magnesium hydroxide (MILK OF MAGNESIA) 400 MG/5ML suspension Take 30 mLs by mouth every 4 (four) hours as needed for mild constipation (No BM in 2 days).  . Melatonin 3 MG TABS Take 1 tablet by mouth at bedtime.  . Multiple Vitamins-Minerals (CENTROVITE) TABS Take 1 tablet by mouth daily.  . nitroGLYCERIN (NITROSTAT) 0.3 MG SL tablet Place 0.3 mg under the tongue every 5 (five) minutes as needed for chest pain.  . polyethylene glycol (MIRALAX / GLYCOLAX) packet Take 17 g by mouth 2 (two) times daily as needed. Hold for loose stools.  . potassium chloride SA (K-DUR,KLOR-CON) 20 MEQ tablet Take 20 mEq by mouth 2 (two) times daily.   . primidone (MYSOLINE) 50 MG tablet Take 200 mg by mouth 2 (two) times daily.   Marland Kitchen saccharomyces boulardii (FLORASTOR) 250 MG capsule Take 250 mg by mouth 2 (two) times daily.  . sacubitril-valsartan (ENTRESTO) 24-26 MG Take 1 tablet by mouth 2 (two) times daily.  . sennosides-docusate sodium (SENOKOT-S) 8.6-50 MG tablet Take 2 tablets by mouth 2 (two) times daily as needed for constipation.   . Skin Protectants, Misc. (ENDIT EX) Apply to bottom and moisture associated breakdown of coccyx area BID and as need  .  spironolactone (ALDACTONE) 25 MG tablet Take 12.5 mg by mouth daily.   . tamsulosin (FLOMAX) 0.4 MG CAPS capsule Take 1 capsule (0.4 mg total) by mouth daily.  Marland Kitchen torsemide (DEMADEX) 20 MG tablet Take 40 mg by mouth daily.   Marland Kitchen umeclidinium-vilanterol (ANORO ELLIPTA) 62.5-25 MCG/INH AEPB Inhale 1 puff into the lungs daily.   No facility-administered encounter medications on file as of 11/09/2018.     PHYSICAL EXAM:   Wt 276 lbs per self report. Does not have scales and cannot stand yet. VS 98.7-60-22  156/84  General: NAD, frail appearing,obese. Reports poor appetite but wt loss could be largely  Due to fluid. Cardiovascular: regular rate and rhythm, S1S2 Pulmonary: moves air poorly all fields, no cough. PO2 sat 95%, been on room air x 15 min.  Abdomen: soft, nontender, + bowel sounds, laxative every other day, may need daily.  GU: no suprapubic tenderness, some incontinence Extremities: no edema,s/p right fx of femur and knee replacement.  Skin: no rashes, LE with discoloration, wound on right great metatarsal and left pretibia, both < 0.4 mm depth. Neurological: Weakness, states no loss of feeling but has PVD due to back surgery. Appropriate, good historian.  Marijo File DNP, AGPCNP-BC

## 2018-11-13 ENCOUNTER — Emergency Department: Payer: Medicare Other

## 2018-11-13 ENCOUNTER — Inpatient Hospital Stay
Admission: EM | Admit: 2018-11-13 | Discharge: 2018-11-19 | DRG: 291 | Disposition: A | Discharge status: Home Health | Payer: Medicare Other | Attending: Internal Medicine | Admitting: Internal Medicine

## 2018-11-13 ENCOUNTER — Encounter: Payer: Self-pay | Admitting: Intensive Care

## 2018-11-13 ENCOUNTER — Other Ambulatory Visit: Payer: Self-pay

## 2018-11-13 DIAGNOSIS — R0600 Dyspnea, unspecified: Secondary | ICD-10-CM

## 2018-11-13 DIAGNOSIS — Z9861 Coronary angioplasty status: Secondary | ICD-10-CM

## 2018-11-13 DIAGNOSIS — I2510 Atherosclerotic heart disease of native coronary artery without angina pectoris: Secondary | ICD-10-CM | POA: Diagnosis present

## 2018-11-13 DIAGNOSIS — Z9081 Acquired absence of spleen: Secondary | ICD-10-CM

## 2018-11-13 DIAGNOSIS — Z79899 Other long term (current) drug therapy: Secondary | ICD-10-CM

## 2018-11-13 DIAGNOSIS — M25512 Pain in left shoulder: Secondary | ICD-10-CM | POA: Diagnosis present

## 2018-11-13 DIAGNOSIS — F410 Panic disorder [episodic paroxysmal anxiety] without agoraphobia: Secondary | ICD-10-CM | POA: Diagnosis present

## 2018-11-13 DIAGNOSIS — M21371 Foot drop, right foot: Secondary | ICD-10-CM | POA: Diagnosis present

## 2018-11-13 DIAGNOSIS — I429 Cardiomyopathy, unspecified: Secondary | ICD-10-CM | POA: Diagnosis present

## 2018-11-13 DIAGNOSIS — Z8052 Family history of malignant neoplasm of bladder: Secondary | ICD-10-CM

## 2018-11-13 DIAGNOSIS — J22 Unspecified acute lower respiratory infection: Secondary | ICD-10-CM | POA: Diagnosis present

## 2018-11-13 DIAGNOSIS — R0602 Shortness of breath: Secondary | ICD-10-CM | POA: Diagnosis not present

## 2018-11-13 DIAGNOSIS — M1990 Unspecified osteoarthritis, unspecified site: Secondary | ICD-10-CM | POA: Diagnosis present

## 2018-11-13 DIAGNOSIS — I872 Venous insufficiency (chronic) (peripheral): Secondary | ICD-10-CM | POA: Diagnosis present

## 2018-11-13 DIAGNOSIS — I083 Combined rheumatic disorders of mitral, aortic and tricuspid valves: Secondary | ICD-10-CM | POA: Diagnosis present

## 2018-11-13 DIAGNOSIS — I8390 Asymptomatic varicose veins of unspecified lower extremity: Secondary | ICD-10-CM | POA: Diagnosis present

## 2018-11-13 DIAGNOSIS — Z7902 Long term (current) use of antithrombotics/antiplatelets: Secondary | ICD-10-CM

## 2018-11-13 DIAGNOSIS — K5900 Constipation, unspecified: Secondary | ICD-10-CM | POA: Diagnosis present

## 2018-11-13 DIAGNOSIS — G4733 Obstructive sleep apnea (adult) (pediatric): Secondary | ICD-10-CM | POA: Diagnosis present

## 2018-11-13 DIAGNOSIS — J9601 Acute respiratory failure with hypoxia: Secondary | ICD-10-CM | POA: Diagnosis present

## 2018-11-13 DIAGNOSIS — E876 Hypokalemia: Secondary | ICD-10-CM | POA: Diagnosis present

## 2018-11-13 DIAGNOSIS — N183 Chronic kidney disease, stage 3 (moderate): Secondary | ICD-10-CM | POA: Diagnosis present

## 2018-11-13 DIAGNOSIS — I130 Hypertensive heart and chronic kidney disease with heart failure and stage 1 through stage 4 chronic kidney disease, or unspecified chronic kidney disease: Secondary | ICD-10-CM | POA: Diagnosis not present

## 2018-11-13 DIAGNOSIS — Z8781 Personal history of (healed) traumatic fracture: Secondary | ICD-10-CM

## 2018-11-13 DIAGNOSIS — Z7401 Bed confinement status: Secondary | ICD-10-CM

## 2018-11-13 DIAGNOSIS — D631 Anemia in chronic kidney disease: Secondary | ICD-10-CM | POA: Diagnosis present

## 2018-11-13 DIAGNOSIS — Z801 Family history of malignant neoplasm of trachea, bronchus and lung: Secondary | ICD-10-CM

## 2018-11-13 DIAGNOSIS — Z79890 Hormone replacement therapy (postmenopausal): Secondary | ICD-10-CM

## 2018-11-13 DIAGNOSIS — G250 Essential tremor: Secondary | ICD-10-CM | POA: Diagnosis present

## 2018-11-13 DIAGNOSIS — Z800 Family history of malignant neoplasm of digestive organs: Secondary | ICD-10-CM

## 2018-11-13 DIAGNOSIS — Z87891 Personal history of nicotine dependence: Secondary | ICD-10-CM

## 2018-11-13 DIAGNOSIS — Z87442 Personal history of urinary calculi: Secondary | ICD-10-CM

## 2018-11-13 DIAGNOSIS — Z515 Encounter for palliative care: Secondary | ICD-10-CM

## 2018-11-13 DIAGNOSIS — Z96652 Presence of left artificial knee joint: Secondary | ICD-10-CM | POA: Diagnosis present

## 2018-11-13 DIAGNOSIS — R600 Localized edema: Secondary | ICD-10-CM

## 2018-11-13 DIAGNOSIS — I441 Atrioventricular block, second degree: Secondary | ICD-10-CM | POA: Diagnosis present

## 2018-11-13 DIAGNOSIS — Z833 Family history of diabetes mellitus: Secondary | ICD-10-CM

## 2018-11-13 DIAGNOSIS — M542 Cervicalgia: Secondary | ICD-10-CM | POA: Diagnosis present

## 2018-11-13 DIAGNOSIS — I509 Heart failure, unspecified: Secondary | ICD-10-CM

## 2018-11-13 DIAGNOSIS — Z7982 Long term (current) use of aspirin: Secondary | ICD-10-CM

## 2018-11-13 DIAGNOSIS — Z6841 Body Mass Index (BMI) 40.0 and over, adult: Secondary | ICD-10-CM

## 2018-11-13 DIAGNOSIS — Z7189 Other specified counseling: Secondary | ICD-10-CM

## 2018-11-13 DIAGNOSIS — J449 Chronic obstructive pulmonary disease, unspecified: Secondary | ICD-10-CM | POA: Diagnosis present

## 2018-11-13 DIAGNOSIS — I5043 Acute on chronic combined systolic (congestive) and diastolic (congestive) heart failure: Secondary | ICD-10-CM | POA: Diagnosis present

## 2018-11-13 DIAGNOSIS — E785 Hyperlipidemia, unspecified: Secondary | ICD-10-CM | POA: Diagnosis present

## 2018-11-13 DIAGNOSIS — J9811 Atelectasis: Secondary | ICD-10-CM | POA: Diagnosis present

## 2018-11-13 DIAGNOSIS — J90 Pleural effusion, not elsewhere classified: Secondary | ICD-10-CM

## 2018-11-13 DIAGNOSIS — I252 Old myocardial infarction: Secondary | ICD-10-CM

## 2018-11-13 DIAGNOSIS — Z8249 Family history of ischemic heart disease and other diseases of the circulatory system: Secondary | ICD-10-CM

## 2018-11-13 LAB — CBC WITH DIFFERENTIAL/PLATELET
Abs Immature Granulocytes: 0.04 10*3/uL (ref 0.00–0.07)
Basophils Absolute: 0 10*3/uL (ref 0.0–0.1)
Basophils Relative: 1 %
Eosinophils Absolute: 0.3 10*3/uL (ref 0.0–0.5)
Eosinophils Relative: 5 %
HCT: 33.4 % — ABNORMAL LOW (ref 39.0–52.0)
Hemoglobin: 10.3 g/dL — ABNORMAL LOW (ref 13.0–17.0)
Immature Granulocytes: 1 %
LYMPHS PCT: 21 %
Lymphs Abs: 1.2 10*3/uL (ref 0.7–4.0)
MCH: 30.4 pg (ref 26.0–34.0)
MCHC: 30.8 g/dL (ref 30.0–36.0)
MCV: 98.5 fL (ref 80.0–100.0)
Monocytes Absolute: 0.4 10*3/uL (ref 0.1–1.0)
Monocytes Relative: 6 %
Neutro Abs: 3.9 10*3/uL (ref 1.7–7.7)
Neutrophils Relative %: 66 %
Platelets: 213 10*3/uL (ref 150–400)
RBC: 3.39 MIL/uL — ABNORMAL LOW (ref 4.22–5.81)
RDW: 17.8 % — ABNORMAL HIGH (ref 11.5–15.5)
WBC: 5.8 10*3/uL (ref 4.0–10.5)
nRBC: 0 % (ref 0.0–0.2)

## 2018-11-13 LAB — COMPREHENSIVE METABOLIC PANEL
ALT: 56 U/L — ABNORMAL HIGH (ref 0–44)
AST: 62 U/L — ABNORMAL HIGH (ref 15–41)
Albumin: 2.7 g/dL — ABNORMAL LOW (ref 3.5–5.0)
Alkaline Phosphatase: 151 U/L — ABNORMAL HIGH (ref 38–126)
Anion gap: 8 (ref 5–15)
BUN: 35 mg/dL — ABNORMAL HIGH (ref 8–23)
CHLORIDE: 98 mmol/L (ref 98–111)
CO2: 32 mmol/L (ref 22–32)
Calcium: 8.5 mg/dL — ABNORMAL LOW (ref 8.9–10.3)
Creatinine, Ser: 1.16 mg/dL (ref 0.61–1.24)
GFR calc Af Amer: >60 mL/min (ref >60)
GFR calc non Af Amer: 59 mL/min — ABNORMAL LOW (ref >60)
Glucose, Bld: 198 mg/dL — ABNORMAL HIGH (ref 70–99)
POTASSIUM: 3.3 mmol/L — AB (ref 3.5–5.1)
Sodium: 138 mmol/L (ref 135–145)
Total Bilirubin: 0.7 mg/dL (ref 0.3–1.2)
Total Protein: 5.9 g/dL — ABNORMAL LOW (ref 6.5–8.1)

## 2018-11-13 LAB — TROPONIN I
Troponin I: 0.06 ng/mL (ref <0.03)
Troponin I: 0.06 ng/mL (ref <0.03)

## 2018-11-13 LAB — BRAIN NATRIURETIC PEPTIDE: B Natriuretic Peptide: 757 pg/mL — ABNORMAL HIGH (ref 0.0–100.0)

## 2018-11-13 LAB — FIBRIN DERIVATIVES D-DIMER (ARMC ONLY): Fibrin derivatives D-dimer (ARMC): 1693.25 ng/mL (FEU) — ABNORMAL HIGH (ref 0.00–499.00)

## 2018-11-13 MED ORDER — HALOPERIDOL 0.5 MG PO TABS
0.5000 mg | ORAL_TABLET | Freq: Once | ORAL | Status: AC
  Administered 2018-11-13: 0.5 mg via ORAL
  Filled 2018-11-13: qty 1

## 2018-11-13 MED ORDER — IOPAMIDOL (ISOVUE-370) INJECTION 76%
100.0000 mL | Freq: Once | INTRAVENOUS | Status: AC | PRN
  Administered 2018-11-13: 100 mL via INTRAVENOUS

## 2018-11-13 NOTE — ED Notes (Signed)
Repositioned pt in bed, placed couple of folded blankets to right gluteus, pt reports relief from area, pt used urinal and had urine output , pt denies any further needs

## 2018-11-13 NOTE — ED Notes (Signed)
Provided pt with a gingerale 

## 2018-11-13 NOTE — ED Notes (Signed)
Informed pt's spouse that Dr. Darnelle Catalan has looked at x-ray and informed it was better from last one pt had per Dr. Darnelle Catalan. Spouse reports she needs to know what is going to happen informed will talk to MD. Dr. Darnelle Catalan made aware

## 2018-11-13 NOTE — ED Notes (Signed)
Charge nurse at bedside talking to pt's spouse

## 2018-11-13 NOTE — ED Triage Notes (Signed)
Patient arrived by EMS from home for SOB. Wears 1L O2 as needed. Was diagnosed with pneumonia about a month ago. HX CHF, CPOD. Patient reports he is bedbound since operation on knee in September 2019.

## 2018-11-13 NOTE — ED Notes (Addendum)
Pt's spouse left exam room accompanied by pt's caregiver and took some pt's belongings bag with her, pt's spouse left phone number on board [phone]

## 2018-11-13 NOTE — ED Notes (Signed)
Reposition pt in bed Dorna Leitz and this RN provided perineal care, to pt, pt used urinal and had output, removed pt's oxygen while moving pt side to side pt's oxygen saturation 97% RA pt talking in complete sentences no distress noted. Dr. Darnelle Catalan made aware

## 2018-11-13 NOTE — ED Notes (Signed)
Pt spouse upset due to waiting Rn has explained sent blood work for repeat troponin, has requested to talk to charge nurse

## 2018-11-13 NOTE — ED Notes (Signed)
Reposition pt in bed

## 2018-11-13 NOTE — ED Notes (Signed)
Pt's wife reports earlier she helped her husband with urinal at pt has output of urine

## 2018-11-13 NOTE — ED Notes (Signed)
Dr. Darnelle Catalan at bedside talking to pt's spouse about plan of care, per Dr.Malinda pt will have a CT Angio test, pt's spouse verbalizes understanding, no others questions at this time

## 2018-11-13 NOTE — ED Notes (Signed)
Patient transported to CT 

## 2018-11-13 NOTE — ED Provider Notes (Signed)
[facility] Emergency Department Provider Note   ____________________________________________   None    (approximate)  I have reviewed the triage vital signs and the nursing notes.   HISTORY  Chief Complaint Shortness of Breath   HPI Dylan [name] is a 83 y.o. male who complains of episodes of shortness of breath lasting possibly half an hour.  He is coughing up thick but clear phlegm.  He reports he has been bedbound since his knee surgery in September of last year.  He does not think he has a fever.  He does have a history of CHF and COPD.  He is on 1 L of oxygen but even with that feels short of breath.         Past Medical History:  Diagnosis Date   CAD (coronary artery disease)    Cervicalgia    CHF (congestive heart failure) (HCC)    COPD (chronic obstructive pulmonary disease) (HCC)    Diastolic heart failure (HCC)    Foot drop, right    History of kidney stones    Hyperlipidemia    unspecified   Hypertension    Myocardial infarction [facility])    Osteoarthritis    Shoulder pain, left    Sleep apnea    Tremor, essential     Patient Active Problem List   Diagnosis Date Noted   Acute on chronic systolic CHF (congestive heart failure) (HCC) 10/27/2018   HCAP (healthcare-associated pneumonia) 10/27/2018   Hypokalemia 10/01/2018   Hypertensive heart and kidney disease with acute on chronic diastolic congestive heart failure and stage 3 chronic kidney disease (HCC) 09/09/2018   Chronic constipation 09/09/2018   BPH with obstruction/lower urinary tract symptoms 09/09/2018   Obstructive sleep apnea of adult 09/09/2018   Idiopathic peripheral neuropathy 09/09/2018   Chronic kidney disease, stage 3, mod decreased GFR (HCC) 09/09/2018   Anemia of chronic renal failure, stage 3 (moderate) (HCC) 09/09/2018   Periprosthetic fracture around internal prosthetic left knee joint 08/26/2018   Acute ST elevation myocardial  infarction (STEMI) of inferior wall (HCC) 06/21/2018   S/P total knee arthroplasty 06/01/2018   Primary osteoarthritis of left knee 02/05/2018   Sepsis (HCC) 01/10/2018   Community acquired pneumonia 12/01/2017   Pneumonia 12/01/2017   Hyperlipidemia 09/23/2017   Weakness 08/31/2017   Benign essential tremor 10/18/2016   Class 3 severe obesity due to excess calories with serious comorbidity and body mass index (BMI) of 45.0 to 49.9 in adult (HCC) 10/18/2016   Varicose veins of lower extremities with ulcer (HCC) 06/17/2016   Chronic venous insufficiency 06/17/2016   Lymphedema 06/17/2016   Acute on chronic diastolic heart failure (HCC) 06/10/2016   COPD, severe (HCC) 05/24/2016   AV block, Mobitz 1 02/09/2016   Coronary atherosclerosis of native coronary artery 02/09/2016   ST elevation MI (STEMI) (HCC) 02/08/2016   Right foot drop 04/07/2014   Rotator cuff rupture, complete 04/22/2013   Neck pain 04/14/2013    Past Surgical History:  Procedure Laterality Date   APPLICATION OF WOUND VAC Right 11/13/2017   Procedure: APPLICATION OF WOUND VAC;  Surgeon: Annice Needy, MD;  Location: [facility] ORS;  Service: Vascular;  Laterality: Right;   BACK SURGERY  06/2010   CARDIAC CATHETERIZATION Left 04/30/2016   Procedure: Left Heart Cath and Coronary Angiography;  Surgeon: Laurier Nancy, MD;  Location: [facility] INVASIVE CV LAB;  Service: Cardiovascular;  Laterality: Left;   COLONOSCOPY     CORONARY ANGIOPLASTY     CORONARY/GRAFT  ACUTE MI REVASCULARIZATION N/A 06/21/2018   Procedure: Coronary/Graft Acute MI Revascularization;  Surgeon: Iran Ouch, MD;  Location: [facility] INVASIVE CV LAB;  Service: Cardiovascular;  Laterality: N/A;   KNEE ARTHROPLASTY Left 06/01/2018   Procedure: COMPUTER ASSISTED TOTAL KNEE ARTHROPLASTY;  Surgeon: Donato Heinz, MD;  Location: [facility] ORS;  Service: Orthopedics;  Laterality: Left;   KNEE ARTHROSCOPY Left    KNEE SURGERY Left 1998   LEFT  HEART CATH AND CORONARY ANGIOGRAPHY N/A 06/21/2018   Procedure: LEFT HEART CATH AND CORONARY ANGIOGRAPHY;  Surgeon: Iran Ouch, MD;  Location: [facility] INVASIVE CV LAB;  Service: Cardiovascular;  Laterality: N/A;   ORIF FEMUR FRACTURE Left 08/27/2018   Procedure: OPEN REDUCTION INTERNAL FIXATION (ORIF) SUPRACONDYLAR FEMUR FRACTURE ABOVE PROSTHESIS, QUADRICEPS REPAIR;  Surgeon: Kennedy Bucker, MD;  Location: [facility] ORS;  Service: Orthopedics;  Laterality: Left;   TONSILLECTOMY     WOUND DEBRIDEMENT Right 11/13/2017   Procedure: DEBRIDEMENT WOUND;  Surgeon: Annice Needy, MD;  Location: [facility] ORS;  Service: Vascular;  Laterality: Right;    Prior to Admission medications   Medication Sig Start Date End Date Taking? Authorizing Provider  acetaminophen (TYLENOL) 500 MG tablet Take 1,000 mg by mouth 2 (two) times daily as needed for moderate pain.    [provider]  alum & mag hydroxide-simeth (MAALOX PLUS) 400-400-40 MG/5ML suspension Take 30 mLs by mouth every 4 (four) hours as needed for indigestion. 09/04/18   [provider]  Ascorbic Acid (VITAMIN C PO) Take 500 mg by mouth daily.     [provider]  aspirin EC 81 MG tablet Take 81 mg by mouth daily.    [provider]  atorvastatin (LIPITOR) 80 MG tablet Take 80 mg by mouth daily.  09/04/18   [provider]  benzonatate (TESSALON PERLES) 100 MG capsule Take 2 capsules (200 mg total) by mouth 3 (three) times daily as needed for cough. 10/31/18   [name], [name], MD  bisacodyl (DULCOLAX) 10 MG suppository Place 10 mg rectally as needed for moderate constipation.    [provider]  carvedilol (COREG) 6.25 MG tablet Take 6.25 mg by mouth 2 (two) times daily with a meal. 09/07/18   [provider]  Cholecalciferol 100 MCG (4000 UT) TABS Take 1 tablet by mouth daily. 09/07/18   [provider]  clopidogrel (PLAVIX) 75 MG tablet Take 75 mg by mouth daily.    [provider]    dextromethorphan (DELSYM) 30 MG/5ML liquid Take 5 mLs (30 mg total) by mouth 2 (two) times daily as needed for cough. 10/31/18   [name], [name], MD  dextromethorphan-guaiFENesin (MUCINEX DM) 30-600 MG 12hr tablet Take 1 tablet by mouth 2 (two) times daily as needed for cough.    [provider]  furosemide (LASIX) 20 MG tablet Take 3 tablets (60 mg total) by mouth 2 (two) times daily. 10/31/18   [name], [name], MD  gabapentin (NEURONTIN) 400 MG capsule Take 400 mg by mouth at bedtime.    [provider]  ipratropium-albuterol (DUONEB) 0.5-2.5 (3) MG/3ML SOLN Take 3 mLs by nebulization every 6 (six) hours as needed (shortness of breath).     [provider]  isosorbide mononitrate (IMDUR) 30 MG 24 hr tablet Take 30 mg by mouth daily.  05/31/16   [provider]  ketotifen (ZADITOR) 0.025 % ophthalmic solution Place 1 drop into both eyes 2 (two) times daily. 01/14/18   Alford Highland, MD  loratadine (CLARITIN) 10 MG  tablet Take 10 mg by mouth daily as needed for allergies.    [provider]  magnesium hydroxide (MILK OF MAGNESIA) 400 MG/5ML suspension Take 30 mLs by mouth every 4 (four) hours as needed for mild constipation (No BM in 2 days).    [provider]  Melatonin 3 MG TABS Take 1 tablet by mouth at bedtime. 09/10/18   [provider]  Multiple Vitamins-Minerals (CENTROVITE) TABS Take 1 tablet by mouth daily.    [provider]  nitroGLYCERIN (NITROSTAT) 0.3 MG SL tablet Place 0.3 mg under the tongue every 5 (five) minutes as needed for chest pain.    [provider]  polyethylene glycol (MIRALAX / GLYCOLAX) packet Take 17 g by mouth 2 (two) times daily as needed. Hold for loose stools. 09/29/18   [provider]  potassium chloride SA (K-DUR,KLOR-CON) 20 MEQ tablet Take 20 mEq by mouth 2 (two) times daily.     [provider]  primidone (MYSOLINE) 50 MG tablet Take 200 mg by mouth 2 (two) times  daily.     [provider]  saccharomyces boulardii (FLORASTOR) 250 MG capsule Take 250 mg by mouth 2 (two) times daily. 10/26/18 12/04/18  [provider]  sacubitril-valsartan (ENTRESTO) 24-26 MG Take 1 tablet by mouth 2 (two) times daily.    [provider]  sennosides-docusate sodium (SENOKOT-S) 8.6-50 MG tablet Take 2 tablets by mouth 2 (two) times daily as needed for constipation.  09/07/18   [provider]  Skin Protectants, Misc. (ENDIT EX) Apply to bottom and moisture associated breakdown of coccyx area BID and as need 09/15/18   [provider]  spironolactone (ALDACTONE) 25 MG tablet Take 12.5 mg by mouth daily.  09/04/18   [provider]  tamsulosin (FLOMAX) 0.4 MG CAPS capsule Take 1 capsule (0.4 mg total) by mouth daily. 09/01/18   Evon Slack, PA-C  torsemide (DEMADEX) 20 MG tablet Take 40 mg by mouth daily.  09/21/18   [provider]  umeclidinium-vilanterol (ANORO ELLIPTA) 62.5-25 MCG/INH AEPB Inhale 1 puff into the lungs daily. 09/04/17   Shaune Pollack, MD    Allergies Patient has no known allergies.  Family History  Problem Relation Age of Onset   Diabetes Son    Cancer Mother    Colon cancer Mother    Lung cancer Mother    Tremor Father    Heart disease Father    Tremor Brother    Bladder Cancer Brother    Tremor Sister     Social History Social History   Tobacco Use   Smoking status: Former Smoker    Packs/day: 0.25    Years: 58.00    Pack years: 14.50    Types: Cigarettes   Smokeless tobacco: Former Neurosurgeon   Tobacco comment: quit the begginning of septembet 2019  Substance Use Topics   Alcohol use: No    Alcohol/week: 0.0 standard drinks   Drug use: No    Review of Systems  Constitutional: No fever/chills Eyes: No visual changes. ENT: No sore throat. Cardiovascular: Denies chest pain. Respiratory:  shortness of breath. Gastrointestinal: No abdominal pain.  No nausea, no  vomiting.  No diarrhea.  No constipation. Genitourinary: Negative for dysuria. Musculoskeletal: Negative for back pain. Skin: Negative for rash. Neurological: Negative for headaches, focal weakness   ____________________________________________   PHYSICAL EXAM:  VITAL SIGNS: ED Triage Vitals [11/13/18 1822]  Enc Vitals Group     BP (!) 144/60     Pulse  Rate 65     Resp (!) 22     Temp 97.9 F (36.6 C)     Temp Source Oral     SpO2 96 %     Weight      Height      Head Circumference      Peak Flow      Pain Score      Pain Loc      Pain Edu?      Excl. in GC?     Constitutional: Alert and oriented.  Chronically ill-appearing gentleman laying in bed breathing hard Eyes: Conjunctivae are normal.  Head: Atraumatic. Nose: No congestion/rhinnorhea. Mouth/Throat: Mucous membranes are moist.  Oropharynx non-erythematous. Neck: No stridor.   Cardiovascular: Normal rate, regular rhythm. Grossly normal heart sounds.  Good peripheral circulation. Respiratory: Normal respiratory effort.  No retractions. Lungs occasional scattered crackles Gastrointestinal: Soft and nontender. No distention. No abdominal bruits. No CVA tenderness. Musculoskeletal: No lower extremity tenderness bilateral 1+ edema.   Neurologic:  Normal speech and language. No gross focal neurologic deficits are appreciated. Skin:  Skin is warm, dry and intact. No rash noted.   ____________________________________________   LABS (all labs ordered are listed, but only abnormal results are displayed)  Labs Reviewed  COMPREHENSIVE METABOLIC PANEL - Abnormal; Notable for the following components:      Result Value   Potassium 3.3 (*)    Glucose, Bld 198 (*)    BUN 35 (*)    Calcium 8.5 (*)    Total Protein 5.9 (*)    Albumin 2.7 (*)    AST 62 (*)    ALT 56 (*)    Alkaline Phosphatase 151 (*)    GFR calc non Af Amer 59 (*)    All other components within normal limits  TROPONIN I - Abnormal; Notable for the  following components:   Troponin I 0.06 (*)    All other components within normal limits  BRAIN NATRIURETIC PEPTIDE - Abnormal; Notable for the following components:   B Natriuretic Peptide 757.0 (*)    All other components within normal limits  CBC WITH DIFFERENTIAL/PLATELET - Abnormal; Notable for the following components:   RBC 3.39 (*)    Hemoglobin 10.3 (*)    HCT 33.4 (*)    RDW 17.8 (*)    All other components within normal limits  TROPONIN I - Abnormal; Notable for the following components:   Troponin I 0.06 (*)    All other components within normal limits  FIBRIN DERIVATIVES D-DIMER ([facility] ONLY) - Abnormal; Notable for the following components:   Fibrin derivatives D-dimer [facility]) 0,488.89 (*)    All other components within normal limits  URINALYSIS, COMPLETE (UACMP) WITH MICROSCOPIC  LACTIC ACID, PLASMA  LACTIC ACID, PLASMA   ____________________________________________  EKG  EKG read and interpreted by me shows regular rate and rhythm at a rate of 65 slight left axis no acute ST-T wave changes very poor baseline is difficult to determine if there is a PE in front of every QRS but as I mentioned the there is a regular rate and rhythm ____________________________________________  RADIOLOGY  ED MD interpretation: CT scan shows large pleural effusion and some mucoid impactions.  We will see if we can get the patient to come in and get him a little better.  Official radiology report(s): Dg Chest 2 View  Result Date: 11/13/2018 CLINICAL DATA:  83 year old male treated for pneumonia 1 month ago. Shortness of breath, on oxygen. EXAM: CHEST -  2 VIEW COMPARISON:  10/29/2018 and earlier. FINDINGS: Chronic cardiomegaly. Persistent pleural effusion(s) appears moderate size on the lateral. Continued abrupt termination of gas in the left mainstem bronchus and dense left lung base opacity. No superimposed pneumothorax. Pulmonary vascularity appears stable without overt edema. Paucity of  bowel gas in the upper abdomen. No acute osseous abnormality identified. IMPRESSION: 1. Continued Moderate pleural effusion(s). 2. Continued dense left lower lobe collapse or consolidation. This may simply be atelectasis but pneumonia is difficult to exclude. Electronically Signed   By: Odessa Fleming M.D.   On: 11/13/2018 18:58   Ct Angio Chest Pe W And/or Wo Contrast  Result Date: 11/14/2018 CLINICAL DATA:  Initial evaluation for acute shortness of breath. History of CHF, COPD. EXAM: CT ANGIOGRAPHY CHEST WITH CONTRAST TECHNIQUE: Multidetector CT imaging of the chest was performed using the standard protocol during bolus administration of intravenous contrast. Multiplanar CT image reconstructions and MIPs were obtained to evaluate the vascular anatomy. CONTRAST:  ISOVUE-370 IOPAMIDOL (ISOVUE-370) INJECTION 76% COMPARISON:  Prior radiograph from earlier the same day. FINDINGS: Cardiovascular: Mild-to-moderate atherosclerotic change noted within the intrathoracic aorta. No aneurysm. Visualized great vessels grossly unremarkable. Moderate cardiomegaly with 3 vessel coronary artery calcifications. No pericardial effusion. Pulmonary arterial tree adequately opacified for evaluation. Main pulmonary artery dilated up to 3.5 cm in diameter. No filling defect to suggest acute pulmonary embolism. Re-formatted imaging confirms these findings. Mediastinum/Nodes: Visualized thyroid within normal limits. Scattered enlarged pre tracheal/precarinal nodes measure up to 13 mm in short axis. 11 mm subcarinal node noted. Mild soft tissue fullness at the bilateral hila without discrete adenopathy. No axillary adenopathy. Esophagus within normal limits. Lungs/Pleura: Proximal airway patent. Large left with moderate right layering pleural effusions. Associated volume loss/atelectasis within the bilateral lower lobes, left greater than right. Dense opacity within the posterior left upper lobe also favored to reflect atelectatic  changes. Superimpose hazy ground-glass opacity within the aerated portions of the lungs likely reflects mild edema. Superimposed infection would be difficult to exclude, although is less favored. No pneumothorax. Few scattered superimposed tree-in-bud nodular densities noted within the subpleural right middle lobe, which could reflect mild small airways disease and/or mucoid impaction (series 6, image 33). Upper Abdomen: Visualized upper abdomen demonstrates no acute finding. Musculoskeletal: No acute osseous abnormality. No discrete lytic or blastic osseous lesions. Multilevel degenerative spondylolysis noted within the visualized spine. Gynecomastia noted. Mild diffuse anasarca within the subcutaneous soft tissues. Review of the MIP images confirms the above findings. IMPRESSION: 1. She no CT evidence for acute pulmonary embolism. 2. Large left and moderate right layering pleural effusions with associated volume loss/atelectasis within the bilateral lower lobes. Superimposed hazy ground-glass opacity within the aerated portions of the lungs likely reflects mild edema. Superimposed infection would be difficult to exclude, although is less favored. 3. Few superimposed scattered tree-in-bud nodular densities within the subpleural right middle lobe, which may reflect mild small airways disease and/or mucoid impaction. 4. Enlarged mediastinal adenopathy as above, indeterminate, but may be reactive. 5. Cardiomegaly with 3 vessel coronary artery calcifications. Electronically Signed   By: Rise Mu M.D.   On: 11/14/2018 00:27    ____________________________________________   PROCEDURES  Procedure(s) performed (including Critical Care):  Procedures   ____________________________________________   INITIAL IMPRESSION / ASSESSMENT AND PLAN / ED COURSE   Patient's wife's phone number is [phone].  She wishes to be called with any news and of her update.       ----------------------------------------- 1:00 AM on 11/14/2018 -----------------------------------------  Please see above  note.  Patient's wife will wish an update later as well.  I did update her just now.  Let her know that he is coming into the hospital we will try and work on getting some of the fluid out from his lung.       ____________________________________________   FINAL CLINICAL IMPRESSION(S) / ED DIAGNOSES  Final diagnoses:  SOB (shortness of breath)  Pleural effusion  Dyspnea, unspecified type     ED Discharge Orders    None       Note:  This document was prepared using Dragon voice recognition software and may include unintentional dictation errors.    Arnaldo Natal, MD 11/14/18 [phone]

## 2018-11-14 ENCOUNTER — Inpatient Hospital Stay: Payer: Medicare Other

## 2018-11-14 ENCOUNTER — Encounter: Payer: Self-pay | Admitting: Pulmonary Disease

## 2018-11-14 DIAGNOSIS — Z87442 Personal history of urinary calculi: Secondary | ICD-10-CM | POA: Diagnosis not present

## 2018-11-14 DIAGNOSIS — K5900 Constipation, unspecified: Secondary | ICD-10-CM | POA: Diagnosis present

## 2018-11-14 DIAGNOSIS — I5022 Chronic systolic (congestive) heart failure: Secondary | ICD-10-CM | POA: Diagnosis not present

## 2018-11-14 DIAGNOSIS — J449 Chronic obstructive pulmonary disease, unspecified: Secondary | ICD-10-CM | POA: Diagnosis present

## 2018-11-14 DIAGNOSIS — R0602 Shortness of breath: Secondary | ICD-10-CM | POA: Diagnosis present

## 2018-11-14 DIAGNOSIS — M25512 Pain in left shoulder: Secondary | ICD-10-CM | POA: Diagnosis present

## 2018-11-14 DIAGNOSIS — Z7189 Other specified counseling: Secondary | ICD-10-CM | POA: Diagnosis not present

## 2018-11-14 DIAGNOSIS — J9811 Atelectasis: Secondary | ICD-10-CM | POA: Diagnosis present

## 2018-11-14 DIAGNOSIS — Z96652 Presence of left artificial knee joint: Secondary | ICD-10-CM | POA: Diagnosis present

## 2018-11-14 DIAGNOSIS — M542 Cervicalgia: Secondary | ICD-10-CM | POA: Diagnosis present

## 2018-11-14 DIAGNOSIS — R7881 Bacteremia: Secondary | ICD-10-CM | POA: Diagnosis not present

## 2018-11-14 DIAGNOSIS — I5043 Acute on chronic combined systolic (congestive) and diastolic (congestive) heart failure: Secondary | ICD-10-CM | POA: Diagnosis present

## 2018-11-14 DIAGNOSIS — E785 Hyperlipidemia, unspecified: Secondary | ICD-10-CM | POA: Diagnosis present

## 2018-11-14 DIAGNOSIS — N183 Chronic kidney disease, stage 3 (moderate): Secondary | ICD-10-CM | POA: Diagnosis present

## 2018-11-14 DIAGNOSIS — B954 Other streptococcus as the cause of diseases classified elsewhere: Secondary | ICD-10-CM | POA: Diagnosis not present

## 2018-11-14 DIAGNOSIS — M1990 Unspecified osteoarthritis, unspecified site: Secondary | ICD-10-CM | POA: Diagnosis present

## 2018-11-14 DIAGNOSIS — J22 Unspecified acute lower respiratory infection: Secondary | ICD-10-CM | POA: Diagnosis present

## 2018-11-14 DIAGNOSIS — Z833 Family history of diabetes mellitus: Secondary | ICD-10-CM | POA: Diagnosis not present

## 2018-11-14 DIAGNOSIS — J9601 Acute respiratory failure with hypoxia: Secondary | ICD-10-CM | POA: Diagnosis present

## 2018-11-14 DIAGNOSIS — I130 Hypertensive heart and chronic kidney disease with heart failure and stage 1 through stage 4 chronic kidney disease, or unspecified chronic kidney disease: Secondary | ICD-10-CM | POA: Diagnosis present

## 2018-11-14 DIAGNOSIS — Z87891 Personal history of nicotine dependence: Secondary | ICD-10-CM | POA: Diagnosis not present

## 2018-11-14 DIAGNOSIS — I509 Heart failure, unspecified: Secondary | ICD-10-CM

## 2018-11-14 DIAGNOSIS — L98499 Non-pressure chronic ulcer of skin of other sites with unspecified severity: Secondary | ICD-10-CM | POA: Diagnosis not present

## 2018-11-14 DIAGNOSIS — I2510 Atherosclerotic heart disease of native coronary artery without angina pectoris: Secondary | ICD-10-CM | POA: Diagnosis present

## 2018-11-14 DIAGNOSIS — I5041 Acute combined systolic (congestive) and diastolic (congestive) heart failure: Secondary | ICD-10-CM | POA: Diagnosis not present

## 2018-11-14 DIAGNOSIS — D631 Anemia in chronic kidney disease: Secondary | ICD-10-CM | POA: Diagnosis present

## 2018-11-14 DIAGNOSIS — M21371 Foot drop, right foot: Secondary | ICD-10-CM | POA: Diagnosis present

## 2018-11-14 DIAGNOSIS — E876 Hypokalemia: Secondary | ICD-10-CM | POA: Diagnosis present

## 2018-11-14 DIAGNOSIS — I252 Old myocardial infarction: Secondary | ICD-10-CM | POA: Diagnosis not present

## 2018-11-14 DIAGNOSIS — G250 Essential tremor: Secondary | ICD-10-CM | POA: Diagnosis present

## 2018-11-14 DIAGNOSIS — Z6841 Body Mass Index (BMI) 40.0 and over, adult: Secondary | ICD-10-CM | POA: Diagnosis not present

## 2018-11-14 DIAGNOSIS — G4733 Obstructive sleep apnea (adult) (pediatric): Secondary | ICD-10-CM | POA: Diagnosis present

## 2018-11-14 DIAGNOSIS — I340 Nonrheumatic mitral (valve) insufficiency: Secondary | ICD-10-CM | POA: Diagnosis not present

## 2018-11-14 DIAGNOSIS — Z515 Encounter for palliative care: Secondary | ICD-10-CM | POA: Diagnosis not present

## 2018-11-14 LAB — CBC WITH DIFFERENTIAL/PLATELET
Abs Immature Granulocytes: 0.04 10*3/uL (ref 0.00–0.07)
BASOS ABS: 0 10*3/uL (ref 0.0–0.1)
Basophils Relative: 0 %
Eosinophils Absolute: 0.3 10*3/uL (ref 0.0–0.5)
Eosinophils Relative: 5 %
HCT: 34.4 % — ABNORMAL LOW (ref 39.0–52.0)
Hemoglobin: 10.7 g/dL — ABNORMAL LOW (ref 13.0–17.0)
Immature Granulocytes: 1 %
Lymphocytes Relative: 22 %
Lymphs Abs: 1.5 10*3/uL (ref 0.7–4.0)
MCH: 30.8 pg (ref 26.0–34.0)
MCHC: 31.1 g/dL (ref 30.0–36.0)
MCV: 99.1 fL (ref 80.0–100.0)
Monocytes Absolute: 0.8 10*3/uL (ref 0.1–1.0)
Monocytes Relative: 11 %
NRBC: 0 % (ref 0.0–0.2)
Neutro Abs: 4.3 10*3/uL (ref 1.7–7.7)
Neutrophils Relative %: 61 %
PLATELETS: 194 10*3/uL (ref 150–400)
RBC: 3.47 MIL/uL — ABNORMAL LOW (ref 4.22–5.81)
RDW: 18 % — ABNORMAL HIGH (ref 11.5–15.5)
WBC: 7 10*3/uL (ref 4.0–10.5)

## 2018-11-14 LAB — LACTIC ACID, PLASMA
LACTIC ACID, VENOUS: 1.3 mmol/L (ref 0.5–1.9)
LACTIC ACID, VENOUS: 1.2 mmol/L (ref 0.5–1.9)

## 2018-11-14 LAB — COMPREHENSIVE METABOLIC PANEL
ALT: 46 U/L — ABNORMAL HIGH (ref 0–44)
AST: 42 U/L — ABNORMAL HIGH (ref 15–41)
Albumin: 2.6 g/dL — ABNORMAL LOW (ref 3.5–5.0)
Alkaline Phosphatase: 119 U/L (ref 38–126)
Anion gap: 10 (ref 5–15)
BILIRUBIN TOTAL: 0.7 mg/dL (ref 0.3–1.2)
BUN: 30 mg/dL — ABNORMAL HIGH (ref 8–23)
CO2: 30 mmol/L (ref 22–32)
Calcium: 8.6 mg/dL — ABNORMAL LOW (ref 8.9–10.3)
Chloride: 99 mmol/L (ref 98–111)
Creatinine, Ser: 0.96 mg/dL (ref 0.61–1.24)
GFR calc Af Amer: >60 mL/min (ref >60)
GFR calc non Af Amer: >60 mL/min (ref >60)
Glucose, Bld: 105 mg/dL — ABNORMAL HIGH (ref 70–99)
Potassium: 3.2 mmol/L — ABNORMAL LOW (ref 3.5–5.1)
Sodium: 139 mmol/L (ref 135–145)
Total Protein: 5.8 g/dL — ABNORMAL LOW (ref 6.5–8.1)

## 2018-11-14 LAB — MAGNESIUM: Magnesium: 1.8 mg/dL (ref 1.7–2.4)

## 2018-11-14 LAB — TROPONIN I: Troponin I: 0.06 ng/mL (ref <0.03)

## 2018-11-14 MED ORDER — SPIRONOLACTONE 25 MG PO TABS
12.5000 mg | ORAL_TABLET | Freq: Every day | ORAL | Status: DC
  Administered 2018-11-14 – 2018-11-19 (×6): 12.5 mg via ORAL
  Filled 2018-11-14 (×2): qty 0.5
  Filled 2018-11-14 (×4): qty 1
  Filled 2018-11-14: qty 0.5
  Filled 2018-11-14 (×2): qty 1
  Filled 2018-11-14 (×3): qty 0.5

## 2018-11-14 MED ORDER — GUAIFENESIN ER 600 MG PO TB12
600.0000 mg | ORAL_TABLET | Freq: Two times a day (BID) | ORAL | Status: DC
  Administered 2018-11-14 – 2018-11-19 (×10): 600 mg via ORAL
  Filled 2018-11-14 (×10): qty 1

## 2018-11-14 MED ORDER — ENOXAPARIN SODIUM 40 MG/0.4ML ~~LOC~~ SOLN
40.0000 mg | Freq: Two times a day (BID) | SUBCUTANEOUS | Status: DC
  Administered 2018-11-14 – 2018-11-16 (×3): 40 mg via SUBCUTANEOUS
  Filled 2018-11-14 (×4): qty 0.4

## 2018-11-14 MED ORDER — ASPIRIN EC 81 MG PO TBEC
81.0000 mg | DELAYED_RELEASE_TABLET | Freq: Every day | ORAL | Status: DC
  Administered 2018-11-14 – 2018-11-19 (×6): 81 mg via ORAL
  Filled 2018-11-14 (×6): qty 1

## 2018-11-14 MED ORDER — SODIUM CHLORIDE 0.9 % IV SOLN
1.0000 g | INTRAVENOUS | Status: DC
  Administered 2018-11-15: 1 g via INTRAVENOUS
  Filled 2018-11-14: qty 1

## 2018-11-14 MED ORDER — LORATADINE 10 MG PO TABS: 10.0000 mg | ORAL_TABLET | Freq: Every day | ORAL | Status: DC | PRN

## 2018-11-14 MED ORDER — POTASSIUM CHLORIDE CRYS ER 20 MEQ PO TBCR
40.0000 meq | EXTENDED_RELEASE_TABLET | Freq: Two times a day (BID) | ORAL | Status: DC
  Administered 2018-11-14 – 2018-11-15 (×2): 40 meq via ORAL
  Filled 2018-11-14 (×2): qty 2

## 2018-11-14 MED ORDER — VANCOMYCIN HCL 10 G IV SOLR
2500.0000 mg | Freq: Once | INTRAVENOUS | Status: AC
  Administered 2018-11-14: 2500 mg via INTRAVENOUS
  Filled 2018-11-14: qty 2500

## 2018-11-14 MED ORDER — AZITHROMYCIN 500 MG PO TABS
500.0000 mg | ORAL_TABLET | Freq: Every day | ORAL | Status: AC
  Administered 2018-11-14: 500 mg via ORAL
  Filled 2018-11-14: qty 2
  Filled 2018-11-14: qty 1

## 2018-11-14 MED ORDER — ISOSORBIDE MONONITRATE ER 30 MG PO TB24
30.0000 mg | ORAL_TABLET | Freq: Every day | ORAL | Status: DC
  Administered 2018-11-14 – 2018-11-19 (×6): 30 mg via ORAL
  Filled 2018-11-14 (×6): qty 1

## 2018-11-14 MED ORDER — BENZONATATE 100 MG PO CAPS: 200.0000 mg | ORAL_CAPSULE | Freq: Three times a day (TID) | ORAL | Status: DC | PRN

## 2018-11-14 MED ORDER — ENOXAPARIN SODIUM 40 MG/0.4ML ~~LOC~~ SOLN
40.0000 mg | SUBCUTANEOUS | Status: DC
  Administered 2018-11-14: 40 mg via SUBCUTANEOUS
  Filled 2018-11-14: qty 0.4

## 2018-11-14 MED ORDER — ADULT MULTIVITAMIN W/MINERALS CH
1.0000 | ORAL_TABLET | Freq: Every day | ORAL | Status: DC
  Administered 2018-11-14 – 2018-11-19 (×6): 1 via ORAL
  Filled 2018-11-14 (×6): qty 1

## 2018-11-14 MED ORDER — SODIUM CHLORIDE 0.9% FLUSH
3.0000 mL | Freq: Two times a day (BID) | INTRAVENOUS | Status: DC
  Administered 2018-11-14 – 2018-11-19 (×11): 3 mL via INTRAVENOUS

## 2018-11-14 MED ORDER — ALUM & MAG HYDROXIDE-SIMETH 200-200-20 MG/5ML PO SUSP: 30.0000 mL | ORAL | Status: DC | PRN

## 2018-11-14 MED ORDER — MAGNESIUM HYDROXIDE 400 MG/5ML PO SUSP: 30.0000 mL | ORAL | Status: DC | PRN

## 2018-11-14 MED ORDER — VITAMIN D3 25 MCG (1000 UNIT) PO TABS
1000.0000 [IU] | ORAL_TABLET | Freq: Every day | ORAL | Status: DC
  Administered 2018-11-16 – 2018-11-19 (×4): 1000 [IU] via ORAL
  Filled 2018-11-14 (×8): qty 1

## 2018-11-14 MED ORDER — ATORVASTATIN CALCIUM 20 MG PO TABS
80.0000 mg | ORAL_TABLET | Freq: Every day | ORAL | Status: DC
  Administered 2018-11-14 – 2018-11-18 (×5): 80 mg via ORAL
  Filled 2018-11-14: qty 8
  Filled 2018-11-14 (×4): qty 4

## 2018-11-14 MED ORDER — SODIUM CHLORIDE 0.9% FLUSH
3.0000 mL | INTRAVENOUS | Status: DC | PRN
  Administered 2018-11-15: 3 mL via INTRAVENOUS
  Filled 2018-11-14: qty 3

## 2018-11-14 MED ORDER — ONDANSETRON HCL 4 MG/2ML IJ SOLN: 4.0000 mg | Freq: Four times a day (QID) | INTRAMUSCULAR | Status: DC | PRN

## 2018-11-14 MED ORDER — POLYETHYLENE GLYCOL 3350 17 G PO PACK: 17.0000 g | PACK | Freq: Two times a day (BID) | ORAL | Status: DC | PRN

## 2018-11-14 MED ORDER — BISACODYL 10 MG RE SUPP: 10.0000 mg | RECTAL | Status: DC | PRN

## 2018-11-14 MED ORDER — MELATONIN 5 MG PO TABS
5.0000 mg | ORAL_TABLET | Freq: Every day | ORAL | Status: DC
  Administered 2018-11-14 – 2018-11-18 (×5): 5 mg via ORAL
  Filled 2018-11-14 (×6): qty 1

## 2018-11-14 MED ORDER — GABAPENTIN 400 MG PO CAPS
400.0000 mg | ORAL_CAPSULE | Freq: Every day | ORAL | Status: DC
  Administered 2018-11-14 – 2018-11-18 (×5): 400 mg via ORAL
  Filled 2018-11-14 (×5): qty 1

## 2018-11-14 MED ORDER — FUROSEMIDE 10 MG/ML IJ SOLN
80.0000 mg | Freq: Two times a day (BID) | INTRAMUSCULAR | Status: AC
  Administered 2018-11-14 – 2018-11-16 (×4): 80 mg via INTRAVENOUS
  Filled 2018-11-14 (×4): qty 8

## 2018-11-14 MED ORDER — SODIUM CHLORIDE 0.9 % IV SOLN
INTRAVENOUS | Status: DC | PRN
  Administered 2018-11-14 – 2018-11-15 (×2): 500 mL via INTRAVENOUS
  Administered 2018-11-16: 30 mL via INTRAVENOUS
  Administered 2018-11-17: 500 mL via INTRAVENOUS

## 2018-11-14 MED ORDER — CLOPIDOGREL BISULFATE 75 MG PO TABS
75.0000 mg | ORAL_TABLET | Freq: Every day | ORAL | Status: DC
  Administered 2018-11-14 – 2018-11-19 (×6): 75 mg via ORAL
  Filled 2018-11-14 (×6): qty 1

## 2018-11-14 MED ORDER — SACCHAROMYCES BOULARDII 250 MG PO CAPS
250.0000 mg | ORAL_CAPSULE | Freq: Two times a day (BID) | ORAL | Status: DC
  Administered 2018-11-14 – 2018-11-18 (×9): 250 mg via ORAL
  Filled 2018-11-14 (×13): qty 1

## 2018-11-14 MED ORDER — ZOLPIDEM TARTRATE 5 MG PO TABS: 5.0000 mg | ORAL_TABLET | Freq: Every evening | ORAL | Status: DC | PRN

## 2018-11-14 MED ORDER — VITAMIN C 500 MG PO TABS
500.0000 mg | ORAL_TABLET | Freq: Every day | ORAL | Status: DC
  Administered 2018-11-14 – 2018-11-19 (×6): 500 mg via ORAL
  Filled 2018-11-14 (×6): qty 1

## 2018-11-14 MED ORDER — CARVEDILOL 6.25 MG PO TABS
6.2500 mg | ORAL_TABLET | Freq: Two times a day (BID) | ORAL | Status: DC
  Administered 2018-11-14 – 2018-11-19 (×10): 6.25 mg via ORAL
  Filled 2018-11-14 (×11): qty 1

## 2018-11-14 MED ORDER — IPRATROPIUM-ALBUTEROL 0.5-2.5 (3) MG/3ML IN SOLN: 3.0000 mL | RESPIRATORY_TRACT | Status: DC | PRN

## 2018-11-14 MED ORDER — TAMSULOSIN HCL 0.4 MG PO CAPS
0.4000 mg | ORAL_CAPSULE | Freq: Every day | ORAL | Status: DC
  Administered 2018-11-14 – 2018-11-19 (×6): 0.4 mg via ORAL
  Filled 2018-11-14 (×6): qty 1

## 2018-11-14 MED ORDER — FUROSEMIDE 10 MG/ML IJ SOLN: 40.0000 mg | Freq: Two times a day (BID) | INTRAMUSCULAR | Status: DC

## 2018-11-14 MED ORDER — FUROSEMIDE 10 MG/ML IJ SOLN
40.0000 mg | Freq: Two times a day (BID) | INTRAMUSCULAR | Status: DC
  Administered 2018-11-14: 40 mg via INTRAVENOUS
  Filled 2018-11-14: qty 4

## 2018-11-14 MED ORDER — ACETAMINOPHEN 325 MG PO TABS
650.0000 mg | ORAL_TABLET | ORAL | Status: DC | PRN
  Administered 2018-11-15 – 2018-11-17 (×2): 650 mg via ORAL
  Filled 2018-11-14 (×3): qty 2

## 2018-11-14 MED ORDER — NITROGLYCERIN 0.4 MG SL SUBL: 0.4000 mg | SUBLINGUAL_TABLET | SUBLINGUAL | Status: DC | PRN

## 2018-11-14 MED ORDER — KETOTIFEN FUMARATE 0.025 % OP SOLN
1.0000 [drp] | Freq: Two times a day (BID) | OPHTHALMIC | Status: DC
  Administered 2018-11-14 – 2018-11-19 (×9): 1 [drp] via OPHTHALMIC
  Filled 2018-11-14: qty 5

## 2018-11-14 MED ORDER — AZITHROMYCIN 500 MG PO TABS
250.0000 mg | ORAL_TABLET | Freq: Every day | ORAL | Status: DC
  Administered 2018-11-15 – 2018-11-16 (×2): 250 mg via ORAL
  Filled 2018-11-14 (×2): qty 1
  Filled 2018-11-14 (×2): qty 0.5

## 2018-11-14 MED ORDER — PRIMIDONE 50 MG PO TABS
200.0000 mg | ORAL_TABLET | Freq: Two times a day (BID) | ORAL | Status: DC
  Administered 2018-11-14 – 2018-11-19 (×10): 200 mg via ORAL
  Filled 2018-11-14 (×13): qty 4

## 2018-11-14 MED ORDER — SODIUM CHLORIDE 0.9 % IV SOLN
1.0000 g | INTRAVENOUS | Status: DC
  Administered 2018-11-14: 1 g via INTRAVENOUS
  Filled 2018-11-14: qty 10

## 2018-11-14 MED ORDER — IPRATROPIUM-ALBUTEROL 0.5-2.5 (3) MG/3ML IN SOLN: 3.0000 mL | Freq: Four times a day (QID) | RESPIRATORY_TRACT | Status: DC | PRN

## 2018-11-14 MED ORDER — IPRATROPIUM-ALBUTEROL 0.5-2.5 (3) MG/3ML IN SOLN
3.0000 mL | Freq: Four times a day (QID) | RESPIRATORY_TRACT | Status: DC
  Administered 2018-11-14 – 2018-11-16 (×9): 3 mL via RESPIRATORY_TRACT
  Filled 2018-11-14 (×9): qty 3

## 2018-11-14 MED ORDER — SODIUM CHLORIDE 0.9 % IV SOLN
250.0000 mL | INTRAVENOUS | Status: DC | PRN
  Administered 2018-11-14: 250 mL via INTRAVENOUS

## 2018-11-14 MED ORDER — POTASSIUM CHLORIDE CRYS ER 20 MEQ PO TBCR
40.0000 meq | EXTENDED_RELEASE_TABLET | Freq: Once | ORAL | Status: AC
  Administered 2018-11-14: 40 meq via ORAL
  Filled 2018-11-14: qty 2

## 2018-11-14 MED ORDER — SACUBITRIL-VALSARTAN 24-26 MG PO TABS
1.0000 | ORAL_TABLET | Freq: Two times a day (BID) | ORAL | Status: DC
  Administered 2018-11-14 – 2018-11-19 (×10): 1 via ORAL
  Filled 2018-11-14 (×12): qty 1

## 2018-11-14 MED ORDER — SENNOSIDES-DOCUSATE SODIUM 8.6-50 MG PO TABS
2.0000 | ORAL_TABLET | Freq: Two times a day (BID) | ORAL | Status: DC | PRN
  Administered 2018-11-17: 2 via ORAL
  Filled 2018-11-14 (×2): qty 2

## 2018-11-14 NOTE — Consult Note (Signed)
[facility] Cardiology Consultation Note  Patient ID: Dylan [name], MRN: [id number], DOB/AGE: [birth year] 81 y.o. Admit date: 11/13/2018   Date of Consult: 11/14/2018 Primary Physician: [name], MD Primary Cardiologist: [facility]  Chief Complaint:  Chief Complaint  Patient presents with  . Shortness of Breath   Reason for Consult: Heart failure  HPI: 83 y.o. male with known coronary artery disease status post previous myocardial infarction with systolic dysfunction heart failure and recent admission for acute on chronic systolic heart failure with ejection fraction of 35%.  The patient was placed on appropriate medications and had some diuresis but had some residual concerns pleural effusions.  He went back to the nursing home at that time with no evidence of myocardial infarction but major concerns of shortness of breath PND orthopnea lower extremity edema and inability to breathe.  When arriving this recent time he has had a CAT scan showing cardiomegaly with bilateral pleural effusions and left lower lung consolidation of concern.  These effusions may be secondary to chronic kidney disease stage III anemia heart failure as well.  He was placed on appropriate medication management in the past for his cardiomyopathy including carvedilol and furosemide and has had stable blood pressure and heart rate.  The patient has had intravenous Lasix overnight and has had some improvements of symptoms but there is still concerns for pleural effusions causing issues.  Currently there is no evidence of elevated troponin or myocardial infarction.  The patient is hemodynamically stable  Past Medical History:  Diagnosis Date  . CAD (coronary artery disease)   . Cervicalgia   . CHF (congestive heart failure) (HCC)   . COPD (chronic obstructive pulmonary disease) (HCC)   . Diastolic heart failure (HCC)   . Foot drop, right   . History of kidney stones   . Hyperlipidemia    unspecified  .  Hypertension   . Myocardial infarction (HCC)   . Osteoarthritis   . Shoulder pain, left   . Sleep apnea   . Tremor, essential       Surgical History:  Past Surgical History:  Procedure Laterality Date  . APPLICATION OF WOUND VAC Right 11/13/2017   Procedure: APPLICATION OF WOUND VAC;  Surgeon: Annice Needy, MD;  Location: [facility] ORS;  Service: Vascular;  Laterality: Right;  . BACK SURGERY  06/2010  . CARDIAC CATHETERIZATION Left 04/30/2016   Procedure: Left Heart Cath and Coronary Angiography;  Surgeon: Laurier Nancy, MD;  Location: [facility] INVASIVE CV LAB;  Service: Cardiovascular;  Laterality: Left;  . COLONOSCOPY    . CORONARY ANGIOPLASTY    . CORONARY/GRAFT ACUTE MI REVASCULARIZATION N/A 06/21/2018   Procedure: Coronary/Graft Acute MI Revascularization;  Surgeon: Iran Ouch, MD;  Location: [facility] INVASIVE CV LAB;  Service: Cardiovascular;  Laterality: N/A;  . KNEE ARTHROPLASTY Left 06/01/2018   Procedure: COMPUTER ASSISTED TOTAL KNEE ARTHROPLASTY;  Surgeon: Donato Heinz, MD;  Location: [facility] ORS;  Service: Orthopedics;  Laterality: Left;  . KNEE ARTHROSCOPY Left   . KNEE SURGERY Left 1998  . LEFT HEART CATH AND CORONARY ANGIOGRAPHY N/A 06/21/2018   Procedure: LEFT HEART CATH AND CORONARY ANGIOGRAPHY;  Surgeon: Iran Ouch, MD;  Location: [facility] INVASIVE CV LAB;  Service: Cardiovascular;  Laterality: N/A;  . ORIF FEMUR FRACTURE Left 08/27/2018   Procedure: OPEN REDUCTION INTERNAL FIXATION (ORIF) SUPRACONDYLAR FEMUR FRACTURE ABOVE PROSTHESIS, QUADRICEPS REPAIR;  Surgeon: Kennedy Bucker, MD;  Location: [facility] ORS;  Service: Orthopedics;  Laterality: Left;  . TONSILLECTOMY    .  WOUND DEBRIDEMENT Right 11/13/2017   Procedure: DEBRIDEMENT WOUND;  Surgeon: Annice Needy, MD;  Location: [facility] ORS;  Service: Vascular;  Laterality: Right;     Home Meds: Prior to Admission medications   Medication Sig Start Date End Date Taking? Authorizing Provider  acetaminophen (TYLENOL) 500 MG tablet  Take 1,000 mg by mouth 2 (two) times daily as needed for moderate pain.    [provider]  alum & mag hydroxide-simeth (MAALOX PLUS) 400-400-40 MG/5ML suspension Take 30 mLs by mouth every 4 (four) hours as needed for indigestion. 09/04/18   [provider]  Ascorbic Acid (VITAMIN C PO) Take 500 mg by mouth daily.     [provider]  aspirin EC 81 MG tablet Take 81 mg by mouth daily.    [provider]  atorvastatin (LIPITOR) 80 MG tablet Take 80 mg by mouth daily.  09/04/18   [provider]  benzonatate (TESSALON PERLES) 100 MG capsule Take 2 capsules (200 mg total) by mouth 3 (three) times daily as needed for cough. 10/31/18   [name], [name], MD  bisacodyl (DULCOLAX) 10 MG suppository Place 10 mg rectally as needed for moderate constipation.    [provider]  carvedilol (COREG) 6.25 MG tablet Take 6.25 mg by mouth 2 (two) times daily with a meal. 09/07/18   [provider]  Cholecalciferol 100 MCG (4000 UT) TABS Take 1 tablet by mouth daily. 09/07/18   [provider]  clopidogrel (PLAVIX) 75 MG tablet Take 75 mg by mouth daily.    [provider]  dextromethorphan (DELSYM) 30 MG/5ML liquid Take 5 mLs (30 mg total) by mouth 2 (two) times daily as needed for cough. 10/31/18   [name], [name], MD  dextromethorphan-guaiFENesin (MUCINEX DM) 30-600 MG 12hr tablet Take 1 tablet by mouth 2 (two) times daily as needed for cough.    [provider]  furosemide (LASIX) 20 MG tablet Take 3 tablets (60 mg total) by mouth 2 (two) times daily. 10/31/18   [name], [name], MD  gabapentin (NEURONTIN) 400 MG capsule Take 400 mg by mouth at bedtime.    [provider]  ipratropium-albuterol (DUONEB) 0.5-2.5 (3) MG/3ML SOLN Take 3 mLs by nebulization every 6 (six) hours as needed (shortness of breath).     [provider]  isosorbide mononitrate (IMDUR) 30 MG 24 hr tablet Take 30 mg by mouth daily.  05/31/16    [provider]  ketotifen (ZADITOR) 0.025 % ophthalmic solution Place 1 drop into both eyes 2 (two) times daily. 01/14/18   Alford Highland, MD  loratadine (CLARITIN) 10 MG tablet Take 10 mg by mouth daily as needed for allergies.    [provider]  magnesium hydroxide (MILK OF MAGNESIA) 400 MG/5ML suspension Take 30 mLs by mouth every 4 (four) hours as needed for mild constipation (No BM in 2 days).    [provider]  Melatonin 3 MG TABS Take 1 tablet by mouth at bedtime. 09/10/18   [provider]  Multiple Vitamins-Minerals (CENTROVITE) TABS Take 1 tablet by mouth daily.    [provider]  nitroGLYCERIN (NITROSTAT) 0.3 MG SL tablet Place 0.3 mg under the tongue every 5 (five) minutes as needed for chest pain.    [provider]  polyethylene glycol (MIRALAX / GLYCOLAX) packet Take 17 g by mouth 2 (two) times daily as needed. Hold for loose stools. 09/29/18   [provider]  potassium chloride SA (K-DUR,KLOR-CON) 20 MEQ tablet Take  20 mEq by mouth 2 (two) times daily.     [provider]  primidone (MYSOLINE) 50 MG tablet Take 200 mg by mouth 2 (two) times daily.     [provider]  saccharomyces boulardii (FLORASTOR) 250 MG capsule Take 250 mg by mouth 2 (two) times daily. 10/26/18 12/04/18  [provider]  sacubitril-valsartan (ENTRESTO) 24-26 MG Take 1 tablet by mouth 2 (two) times daily.    [provider]  sennosides-docusate sodium (SENOKOT-S) 8.6-50 MG tablet Take 2 tablets by mouth 2 (two) times daily as needed for constipation.  09/07/18   [provider]  Skin Protectants, Misc. (ENDIT EX) Apply to bottom and moisture associated breakdown of coccyx area BID and as need 09/15/18   [provider]  spironolactone (ALDACTONE) 25 MG tablet Take 12.5 mg by mouth daily.  09/04/18   [provider]  tamsulosin (FLOMAX) 0.4 MG CAPS capsule Take 1 capsule (0.4 mg total) by mouth  daily. 09/01/18   Evon Slack, PA-C  torsemide (DEMADEX) 20 MG tablet Take 40 mg by mouth daily.  09/21/18   [provider]  umeclidinium-vilanterol (ANORO ELLIPTA) 62.5-25 MCG/INH AEPB Inhale 1 puff into the lungs daily. 09/04/17   Shaune Pollack, MD    Inpatient Medications:  . aspirin EC  81 mg Oral Daily  . atorvastatin  80 mg Oral Daily  . azithromycin  500 mg Oral Daily   Followed by  . [START ON 11/15/2018] azithromycin  250 mg Oral Daily  . carvedilol  6.25 mg Oral BID WC  . cholecalciferol  1,000 Units Oral Daily  . clopidogrel  75 mg Oral Daily  . enoxaparin (LOVENOX) injection  40 mg Subcutaneous BID  . furosemide  80 mg Intravenous Q12H  . gabapentin  400 mg Oral QHS  . guaiFENesin  600 mg Oral BID  . ipratropium-albuterol  3 mL Nebulization QID  . isosorbide mononitrate  30 mg Oral Daily  . ketotifen  1 drop Both Eyes BID  . Melatonin  5 mg Oral QHS  . multivitamin with minerals  1 tablet Oral Daily  . potassium chloride  40 mEq Oral Once  . potassium chloride  40 mEq Oral BID  . primidone  200 mg Oral BID  . saccharomyces boulardii  250 mg Oral BID  . sacubitril-valsartan  1 tablet Oral BID  . sodium chloride flush  3 mL Intravenous Q12H  . spironolactone  12.5 mg Oral Daily  . tamsulosin  0.4 mg Oral Daily  . vitamin C  500 mg Oral Daily   . sodium chloride 250 mL (11/14/18 0603)  . [START ON 11/15/2018] cefTRIAXone (ROCEPHIN)  IV      Allergies: No Known Allergies  Social History   Socioeconomic History  . Marital status: Married    Spouse name: [name]  . Number of children: 2  . Years of education: 70  . Highest education level: 11th grade  Occupational History  . Occupation: retired  Engineer, production  . Financial resource strain: Not on file  . Food insecurity:    Worry: Not on file    Inability: Not on file  . Transportation needs:    Medical: Not on file    Non-medical: Not on file  Tobacco Use  . Smoking status: Former Smoker     Packs/day: 0.25    Years: 58.00    Pack years: 14.50    Types: Cigarettes  . Smokeless tobacco: Former Neurosurgeon  . Tobacco comment: quit  the begginning of septembet 2019  Substance and Sexual Activity  . Alcohol use: No    Alcohol/week: 0.0 standard drinks  . Drug use: No  . Sexual activity: Not on file  Lifestyle  . Physical activity:    Days per week: Not on file    Minutes per session: Not on file  . Stress: Not on file  Relationships  . Social connections:    Talks on phone: Not on file    Gets together: Not on file    Attends religious service: Not on file    Active member of club or organization: Not on file    Attends meetings of clubs or organizations: Not on file    Relationship status: Not on file  . Intimate partner violence:    Fear of current or ex partner: Not on file    Emotionally abused: Not on file    Physically abused: Not on file    Forced sexual activity: Not on file  Other Topics Concern  . Not on file  Social History Narrative   Full Code with HCPOA and living will   Married with 2 children   Former smoker   Alcohol - none   Smokeless tobacco - none     Family History  Problem Relation Age of Onset  . Diabetes Son   . Cancer Mother   . Colon cancer Mother   . Lung cancer Mother   . Tremor Father   . Heart disease Father   . Tremor Brother   . Bladder Cancer Brother   . Tremor Sister      Review of Systems Positive for shortness of breath cough congestion Negative for: General:  chills, fever, night sweats or weight changes.  Cardiovascular: PND orthopnea syncope dizziness  Dermatological skin lesions rashes Respiratory: Positive for cough congestion Urologic: Frequent urination urination at night and hematuria Abdominal: negative for nausea, vomiting, diarrhea, bright red blood per rectum, melena, or hematemesis Neurologic: negative for visual changes, and/or hearing changes  All other systems reviewed and are otherwise negative except  as noted above.  Labs: Recent Labs    11/13/18 1827 11/13/18 2118 11/14/18 0638  TROPONINI 0.06* 0.06* 0.06*   Lab Results  Component Value Date   WBC 7.0 11/14/2018   HGB 10.7 (L) 11/14/2018   HCT 34.4 (L) 11/14/2018   MCV 99.1 11/14/2018   PLT 194 11/14/2018    Recent Labs  Lab 11/14/18 0638  NA 139  K 3.2*  CL 99  CO2 30  BUN 30*  CREATININE 0.96  CALCIUM 8.6*  PROT 5.8*  BILITOT 0.7  ALKPHOS 119  ALT 46*  AST 42*  GLUCOSE 105*   Lab Results  Component Value Date   CHOL 100 09/08/2017   HDL 34 (L) 09/08/2017   LDLCALC 54 09/08/2017   TRIG 61 09/08/2017   No results found for: DDIMER  Radiology/Studies:  Dg Chest 2 View  Result Date: 11/13/2018 CLINICAL DATA:  83 year old male treated for pneumonia 1 month ago. Shortness of breath, on oxygen. EXAM: CHEST - 2 VIEW COMPARISON:  10/29/2018 and earlier. FINDINGS: Chronic cardiomegaly. Persistent pleural effusion(s) appears moderate size on the lateral. Continued abrupt termination of gas in the left mainstem bronchus and dense left lung base opacity. No superimposed pneumothorax. Pulmonary vascularity appears stable without overt edema. Paucity of bowel gas in the upper abdomen. No acute osseous abnormality identified. IMPRESSION: 1. Continued Moderate pleural effusion(s). 2. Continued dense left lower lobe collapse or consolidation. This  may simply be atelectasis but pneumonia is difficult to exclude. Electronically Signed   By: Odessa Fleming M.D.   On: 11/13/2018 18:58   Dg Chest 2 View  Result Date: 10/29/2018 CLINICAL DATA:  CHF pneumonia EXAM: CHEST - 2 VIEW COMPARISON:  10/27/2018 FINDINGS: AP and lateral views of the chest are limited by positioning. Low lung volumes. The cardio pericardial silhouette is enlarged. Persistent bibasilar collapse/consolidation, left greater than right with bilateral moderate pleural effusions. Interstitial pulmonary edema pattern persists. The visualized bony structures of the thorax  are intact. Telemetry leads overlie the chest. IMPRESSION: No substantial change in exam. Electronically Signed   By: Kennith Center M.D.   On: 10/29/2018 15:54   Dg Chest 2 View  Result Date: 10/27/2018 CLINICAL DATA:  Shortness of breath today. EXAM: CHEST - 2 VIEW COMPARISON:  Single-view of the chest 01/10/2018, 08/30/2018 and 09/01/2018. FINDINGS: Left worse than right effusions and airspace disease are seen. Aeration in the right lung base appears mildly improved compared to the most recent examination. Cardiac silhouette is obscured. No pneumothorax. Aortic atherosclerosis noted. IMPRESSION: Left worse than right pleural effusions and airspace disease. Aeration in the right chest is mildly improved since the most recent examination. Atherosclerosis. Electronically Signed   By: Drusilla Kanner M.D.   On: 10/27/2018 14:48   Ct Angio Chest Pe W And/or Wo Contrast  Result Date: 11/14/2018 CLINICAL DATA:  Initial evaluation for acute shortness of breath. History of CHF, COPD. EXAM: CT ANGIOGRAPHY CHEST WITH CONTRAST TECHNIQUE: Multidetector CT imaging of the chest was performed using the standard protocol during bolus administration of intravenous contrast. Multiplanar CT image reconstructions and MIPs were obtained to evaluate the vascular anatomy. CONTRAST:  ISOVUE-370 IOPAMIDOL (ISOVUE-370) INJECTION 76% COMPARISON:  Prior radiograph from earlier the same day. FINDINGS: Cardiovascular: Mild-to-moderate atherosclerotic change noted within the intrathoracic aorta. No aneurysm. Visualized great vessels grossly unremarkable. Moderate cardiomegaly with 3 vessel coronary artery calcifications. No pericardial effusion. Pulmonary arterial tree adequately opacified for evaluation. Main pulmonary artery dilated up to 3.5 cm in diameter. No filling defect to suggest acute pulmonary embolism. Re-formatted imaging confirms these findings. Mediastinum/Nodes: Visualized thyroid within normal limits. Scattered  enlarged pre tracheal/precarinal nodes measure up to 13 mm in short axis. 11 mm subcarinal node noted. Mild soft tissue fullness at the bilateral hila without discrete adenopathy. No axillary adenopathy. Esophagus within normal limits. Lungs/Pleura: Proximal airway patent. Large left with moderate right layering pleural effusions. Associated volume loss/atelectasis within the bilateral lower lobes, left greater than right. Dense opacity within the posterior left upper lobe also favored to reflect atelectatic changes. Superimpose hazy ground-glass opacity within the aerated portions of the lungs likely reflects mild edema. Superimposed infection would be difficult to exclude, although is less favored. No pneumothorax. Few scattered superimposed tree-in-bud nodular densities noted within the subpleural right middle lobe, which could reflect mild small airways disease and/or mucoid impaction (series 6, image 33). Upper Abdomen: Visualized upper abdomen demonstrates no acute finding. Musculoskeletal: No acute osseous abnormality. No discrete lytic or blastic osseous lesions. Multilevel degenerative spondylolysis noted within the visualized spine. Gynecomastia noted. Mild diffuse anasarca within the subcutaneous soft tissues. Review of the MIP images confirms the above findings. IMPRESSION: 1. She no CT evidence for acute pulmonary embolism. 2. Large left and moderate right layering pleural effusions with associated volume loss/atelectasis within the bilateral lower lobes. Superimposed hazy ground-glass opacity within the aerated portions of the lungs likely reflects mild edema. Superimposed infection would be difficult to exclude,  although is less favored. 3. Few superimposed scattered tree-in-bud nodular densities within the subpleural right middle lobe, which may reflect mild small airways disease and/or mucoid impaction. 4. Enlarged mediastinal adenopathy as above, indeterminate, but may be reactive. 5. Cardiomegaly  with 3 vessel coronary artery calcifications. Electronically Signed   By: Rise Mu M.D.   On: 11/14/2018 00:27   US Venous Img Lower Bilateral  Result Date: 11/14/2018 CLINICAL DATA:  Bilateral lower extremity edema. History of smoking. Evaluate for DVT. EXAM: BILATERAL LOWER EXTREMITY VENOUS DOPPLER ULTRASOUND TECHNIQUE: Gray-scale sonography with graded compression, as well as color Doppler and duplex ultrasound were performed to evaluate the lower extremity deep venous systems from the level of the common femoral vein and including the common femoral, femoral, profunda femoral, popliteal and calf veins including the posterior tibial, peroneal and gastrocnemius veins when visible. The superficial great saphenous vein was also interrogated. Spectral Doppler was utilized to evaluate flow at rest and with distal augmentation maneuvers in the common femoral, femoral and popliteal veins. COMPARISON:  None. FINDINGS: RIGHT LOWER EXTREMITY Common Femoral Vein: No evidence of thrombus. Normal compressibility, respiratory phasicity and response to augmentation. Saphenofemoral Junction: No evidence of thrombus. Normal compressibility and flow on color Doppler imaging. Profunda Femoral Vein: No evidence of thrombus. Normal compressibility and flow on color Doppler imaging. Femoral Vein: No evidence of thrombus. Normal compressibility, respiratory phasicity and response to augmentation. Popliteal Vein: No evidence of thrombus. Normal compressibility, respiratory phasicity and response to augmentation. Calf Veins: No evidence of thrombus. Normal compressibility and flow on color Doppler imaging. Superficial Great Saphenous Vein: No evidence of thrombus. Normal compressibility. Venous Reflux:  None. Other Findings:  None. LEFT LOWER EXTREMITY Common Femoral Vein: No evidence of thrombus. Normal compressibility, respiratory phasicity and response to augmentation. Saphenofemoral Junction: No evidence of thrombus.  Normal compressibility and flow on color Doppler imaging. Profunda Femoral Vein: No evidence of thrombus. Normal compressibility and flow on color Doppler imaging. Femoral Vein: No evidence of thrombus. Normal compressibility, respiratory phasicity and response to augmentation. Popliteal Vein: No evidence of thrombus. Normal compressibility, respiratory phasicity and response to augmentation. Calf Veins: No evidence of thrombus. Normal compressibility and flow on color Doppler imaging. Superficial Great Saphenous Vein: No evidence of thrombus. Normal compressibility. Venous Reflux:  None. Other Findings:  None. IMPRESSION: No evidence of DVT within either lower extremity. Electronically Signed   By: Simonne Come M.D.   On: 11/14/2018 09:10    EKG: Normal sinus rhythm with interventricular conduction defect  Weights: Filed Weights   11/13/18 1830 11/14/18 0257  Weight: 125.6 kg 134.8 kg     Physical Exam: Blood pressure (!) 147/89, pulse 70, temperature 97.9 F (36.6 C), resp. rate 18, height 6' (1.829 m), weight 134.8 kg, SpO2 98 %. Body mass index is 40.29 kg/m. General: Well developed, well nourished, in no acute distress. Head eyes ears nose throat: Normocephalic, atraumatic, sclera non-icteric, no xanthomas, nares are without discharge. No apparent thyromegaly and/or mass  Lungs: Normal respiratory effort.  no wheezes, few basilar rales, some rhonchi.  Bilateral decreased breath sounds Heart: RRR with normal S1 S2. no murmur gallop, no rub, PMI is normal size and placement, carotid upstroke normal without bruit, jugular venous pressure is normal Abdomen: Soft, non-tender,  distended with normoactive bowel sounds. No hepatomegaly. No rebound/guarding. No obvious abdominal masses. Abdominal aorta is normal size without bruit Extremities: Trace to 1+ edema. no cyanosis, no clubbing, no ulcers  Peripheral : 2+ bilateral upper extremity pulses, 1 +  bilateral femoral pulses, 1 + bilateral dorsal  pedal pulse Neuro: Alert and oriented. No facial asymmetry. No focal deficit. Moves all extremities spontaneously. Musculoskeletal: Normal muscle tone without kyphosis Psych:  Responds to questions appropriately with a normal affect.    Assessment: 83 year old male with coronary artery disease hypertension hyperlipidemia with acute on chronic systolic dysfunction congestive heart failure with bilateral pleural effusions multifactorial in nature including chronic kidney disease anemia without evidence of myocardial infarction at this time  Plan: 1.  Continuation of intravenous Lasix at this time for lower extremity edema pulmonary edema and pleural effusions watching closely for worsening chronic kidney disease exacerbation 2.  Continue high intensity cholesterol therapy for coronary atherosclerosis 3.  Beta-blocker Entresto for cardiomyopathy which appears to be appropriate at this time and watch closely for chronic kidney disease exacerbation 4.  Further consideration of thoracentesis if necessary if left lower lung is significantly worsened and not responding to diuresis 5.  No further cardiac diagnostics necessary at this time 6.  Begin cardiac rehabilitation  Signed, Lamar Blinks M.D. [facility] 11/14/2018, 10:09 AM

## 2018-11-14 NOTE — ED Notes (Signed)
Repositioned pt on bed placed pillow on left side under left sacrum, pt u=sed urinal urine output, no further needs at present

## 2018-11-14 NOTE — ED Notes (Signed)
Repositioned pt in bed, pt used urinal and had urine output of , pt had some ginger ale,  No other needs at present

## 2018-11-14 NOTE — Progress Notes (Signed)
Pt arrived from ED alert and oriented. Wound consult ordered for right great toe wound and left lower leg shin skin tear. Cleaned and dressed with film and Band-Aid. No SOB, no c/o pain, no concerns offered.

## 2018-11-14 NOTE — H&P (Addendum)
[facility] -  at [facility]   PATIENT NAME: Dylan [name]    MR#:  [id number]  DATE OF BIRTH:  12-Nov-1935  DATE OF ADMISSION:  11/13/2018  PRIMARY CARE PHYSICIAN: [name], MD   REQUESTING/REFERRING PHYSICIAN: Jonelle Sports, MD  CHIEF COMPLAINT:   Chief Complaint  Patient presents with   Shortness of Breath    HISTORY OF PRESENT ILLNESS:  Dylan [name]  is a 83 y.o. male with a known history of multiple medical problems that will be mentioned below who presented to the emergency room with a consider of dyspnea with associated panic attacks as well as orthopnea without worsening lower extremity edema.  He has been having mild chest congestion without significant cough or wheezing.  No fever or chills.  No nausea vomiting or abdominal pain.  He has been having occasional paroxysmal nocturnal dyspnea.  He denied any rhinorrhea or nasal congestion or sore throat or earache.  No dysuria, oliguria or hematuria or flank pain.  His most recent 2D echo was on 06/21/2018 and it revealed an EF of 30 to 35% with first grade diastolic dysfunction, moderate right ventricular dilation and mild right atrial dilatation with mild mitral regurgitation.  When he came to the ER, blood pressure was 144/60 with respiratory rate 22 pulse ox which was 96% on room air with a temperature of 97.9 and pulse of 65.  Labs were remarkable for mild hypokalemia of 3.3.  Pending creatinine were 35 and 1.16 and AST 62 with ALT 56.  His BNP was 757 and troponin I 0.06 twice.  Lactic acid level was 1.3.  Fibrin derivative d-dimer was 1693.25.  The patient had a portable chest x-ray that showed cardiomegaly and continued moderate pleural effusion as well as left lower lobe dense opacity, collapse versus consolidation possibly atelectasis versus pneumonia.  A chest CTA revealed no evidence for PE.  It showed bilateral pleural effusion with atelectasis in the lower lobes with groundglass densities and  edema likely secondary to edema with infectious etiology less likely.  It also showed right lower lobe density that could be small airway disease and/over mucoid impaction.  EKG was suboptimal due to motion and showed likely accelerated junctional rhythm with a rate of 65 with nonspecific intraventricular conduction delay.  The patient had 2 blood cultures ordered and I ordered him IV Lasix as well as IV Rocephin and Zithromax.  He will be admitted to a telemetry bed. PAST MEDICAL HISTORY:   Past Medical History:  Diagnosis Date   CAD (coronary artery disease)    Cervicalgia    CHF (congestive heart failure) (HCC)    COPD (chronic obstructive pulmonary disease) (HCC)    Diastolic heart failure (HCC)    Foot drop, right    History of kidney stones    Hyperlipidemia    unspecified   Hypertension    Myocardial infarction (HCC)    Osteoarthritis    Shoulder pain, left    Sleep apnea    Tremor, essential     PAST SURGICAL HISTORY:   Past Surgical History:  Procedure Laterality Date   APPLICATION OF WOUND VAC Right 11/13/2017   Procedure: APPLICATION OF WOUND VAC;  Surgeon: Annice Needy, MD;  Location: [facility] ORS;  Service: Vascular;  Laterality: Right;   BACK SURGERY  06/2010   CARDIAC CATHETERIZATION Left 04/30/2016   Procedure: Left Heart Cath and Coronary Angiography;  Surgeon: Laurier Nancy, MD;  Location: [facility] INVASIVE CV LAB;  Service: Cardiovascular;  Laterality: Left;   COLONOSCOPY     CORONARY ANGIOPLASTY     CORONARY/GRAFT ACUTE MI REVASCULARIZATION N/A 06/21/2018   Procedure: Coronary/Graft Acute MI Revascularization;  Surgeon: Iran Ouch, MD;  Location: [facility] CV [facility];  Service: Cardiovascular;  Laterality: N/A;   KNEE ARTHROPLASTY Left 06/01/2018   Procedure: COMPUTER ASSISTED TOTAL KNEE ARTHROPLASTY;  Surgeon: Donato Heinz, MD;  Location: [facility] ORS;  Service: Orthopedics;  Laterality: Left;   KNEE ARTHROSCOPY Left    KNEE SURGERY Left  1998   LEFT HEART CATH AND CORONARY ANGIOGRAPHY N/A 06/21/2018   Procedure: LEFT HEART CATH AND CORONARY ANGIOGRAPHY;  Surgeon: Iran Ouch, MD;  Location: [facility] CV [facility];  Service: Cardiovascular;  Laterality: N/A;   ORIF FEMUR FRACTURE Left 08/27/2018   Procedure: OPEN REDUCTION INTERNAL FIXATION (ORIF) SUPRACONDYLAR FEMUR FRACTURE ABOVE PROSTHESIS, QUADRICEPS REPAIR;  Surgeon: Kennedy Bucker, MD;  Location: [facility] ORS;  Service: Orthopedics;  Laterality: Left;   TONSILLECTOMY     WOUND DEBRIDEMENT Right 11/13/2017   Procedure: DEBRIDEMENT WOUND;  Surgeon: Annice Needy, MD;  Location: [facility] ORS;  Service: Vascular;  Laterality: Right;    SOCIAL HISTORY:   Social History   Tobacco Use   Smoking status: Former Smoker    Packs/day: 0.25    Years: 58.00    Pack years: 14.50    Types: Cigarettes   Smokeless tobacco: Former Neurosurgeon   Tobacco comment: quit the begginning of septembet 2019  Substance Use Topics   Alcohol use: No    Alcohol/week: 0.0 standard drinks    FAMILY HISTORY:   Family History  Problem Relation Age of Onset   Diabetes Son    Cancer Mother    Colon cancer Mother    Lung cancer Mother    Tremor Father    Heart disease Father    Tremor Brother    Bladder Cancer Brother    Tremor Sister     DRUG ALLERGIES:  No Known Allergies  REVIEW OF SYSTEMS:   ROS As per history of present illness. All pertinent systems were reviewed above. Constitutional,  HEENT, cardiovascular, respiratory, GI, GU, musculoskeletal, neuro, psychiatric, endocrine,  integumentary and hematologic systems were reviewed and are otherwise  negative/unremarkable except for positive findings mentioned above in the HPI.   MEDICATIONS AT HOME:   Prior to Admission medications   Medication Sig Start Date End Date Taking? Authorizing Provider  acetaminophen (TYLENOL) 500 MG tablet Take 1,000 mg by mouth 2 (two) times daily as needed for moderate pain.    [provider]  alum & mag hydroxide-simeth (MAALOX PLUS) 400-400-40 MG/5ML suspension Take 30 mLs by mouth every 4 (four) hours as needed for indigestion. 09/04/18   [provider]  Ascorbic Acid (VITAMIN C PO) Take 500 mg by mouth daily.     [provider]  aspirin EC 81 MG tablet Take 81 mg by mouth daily.    [provider]  atorvastatin (LIPITOR) 80 MG tablet Take 80 mg by mouth daily.  09/04/18   [provider]  benzonatate (TESSALON PERLES) 100 MG capsule Take 2 capsules (200 mg total) by mouth 3 (three) times daily as needed for cough. 10/31/18   [name], [name], MD  bisacodyl (DULCOLAX) 10 MG suppository Place 10 mg rectally as needed for moderate constipation.    [provider]  carvedilol (COREG) 6.25 MG tablet Take 6.25 mg by mouth 2 (two) times daily with a meal. 09/07/18   [provider]  Cholecalciferol 100 MCG (4000 UT) TABS Take 1 tablet by mouth daily. 09/07/18   [provider]  clopidogrel (PLAVIX) 75 MG tablet Take 75 mg by mouth daily.    [provider]  dextromethorphan (DELSYM) 30 MG/5ML liquid Take 5 mLs (30 mg total) by mouth 2 (two) times daily as needed for cough. 10/31/18   [name], [name], MD  dextromethorphan-guaiFENesin (MUCINEX DM) 30-600 MG 12hr tablet Take 1 tablet by mouth 2 (two) times daily as needed for cough.    [provider]  furosemide (LASIX) 20 MG tablet Take 3 tablets (60 mg total) by mouth 2 (two) times daily. 10/31/18   [name], [name], MD  gabapentin (NEURONTIN) 400 MG capsule Take 400 mg by mouth at bedtime.    [provider]  ipratropium-albuterol (DUONEB) 0.5-2.5 (3) MG/3ML SOLN Take 3 mLs by nebulization every 6 (six) hours as needed (shortness of breath).     [provider]  isosorbide mononitrate (IMDUR) 30 MG 24 hr tablet Take 30 mg by mouth daily.  05/31/16   [provider]  ketotifen (ZADITOR) 0.025 % ophthalmic solution Place 1 drop  into both eyes 2 (two) times daily. 01/14/18   Alford Highland, MD  loratadine (CLARITIN) 10 MG tablet Take 10 mg by mouth daily as needed for allergies.    [provider]  magnesium hydroxide (MILK OF MAGNESIA) 400 MG/5ML suspension Take 30 mLs by mouth every 4 (four) hours as needed for mild constipation (No BM in 2 days).    [provider]  Melatonin 3 MG TABS Take 1 tablet by mouth at bedtime. 09/10/18   [provider]  Multiple Vitamins-Minerals (CENTROVITE) TABS Take 1 tablet by mouth daily.    [provider]  nitroGLYCERIN (NITROSTAT) 0.3 MG SL tablet Place 0.3 mg under the tongue every 5 (five) minutes as needed for chest pain.    [provider]  polyethylene glycol (MIRALAX / GLYCOLAX) packet Take 17 g by mouth 2 (two) times daily as needed. Hold for loose stools. 09/29/18   [provider]  potassium chloride SA (K-DUR,KLOR-CON) 20 MEQ tablet Take 20 mEq by mouth 2 (two) times daily.     [provider]  primidone (MYSOLINE) 50 MG tablet Take 200 mg by mouth 2 (two) times daily.     [provider]  saccharomyces boulardii (FLORASTOR) 250 MG capsule Take 250 mg by mouth 2 (two) times daily. 10/26/18 12/04/18  [provider]  sacubitril-valsartan (ENTRESTO) 24-26 MG Take 1 tablet by mouth 2 (two) times daily.    [provider]  sennosides-docusate sodium (SENOKOT-S) 8.6-50 MG tablet Take 2 tablets by mouth 2 (two) times daily as needed for constipation.  09/07/18   [provider]  Skin Protectants, Misc. (ENDIT EX) Apply to bottom and moisture associated breakdown of coccyx area BID and as need 09/15/18   [provider]  spironolactone (ALDACTONE) 25 MG tablet Take 12.5 mg by mouth daily.  09/04/18   [provider]  tamsulosin (FLOMAX) 0.4 MG CAPS capsule Take 1 capsule (0.4 mg total) by mouth daily. 09/01/18   Evon Slack, PA-C  torsemide (DEMADEX) 20 MG tablet Take 40 mg  by mouth daily.  09/21/18   [provider]  umeclidinium-vilanterol (ANORO ELLIPTA) 62.5-25 MCG/INH AEPB Inhale 1 puff into the lungs daily. 09/04/17   Shaune Pollack, MD      VITAL SIGNS:  Blood pressure 140/71, pulse 65, temperature 97.7 F (36.5 C), temperature source  Oral, resp. rate 18, height 6' (1.829 m), weight 134.8 kg, SpO2 100 %.  PHYSICAL EXAMINATION:  Physical Exam  GENERAL:  83 y.o.-year-old patient lying in the bed with no acute distress.  EYES: Pupils equal, round, reactive to light and accommodation. No scleral icterus. Extraocular muscles intact.  HEENT: Head atraumatic, normocephalic. Oropharynx and nasopharynx clear.  NECK:  Supple, no jugular venous distention. No thyroid enlargement, no tenderness.  LUNGS: Diminished bibasilar breath sounds with bibasal rales.  No use of accessory muscles of respiration.  CARDIOVASCULAR: S1, S2 normal. No murmurs, rubs, or gallops.  ABDOMEN: Soft, nontender, nondistended. Bowel sounds present. No organomegaly or mass.  EXTREMITIES: Trace bilateral ankle edema, with no cyanosis, or clubbing.  NEUROLOGIC: Cranial nerves II through XII are intact. Muscle strength 5/5 in all extremities. Sensation intact. Gait not checked.  PSYCHIATRIC: The patient is alert and oriented x 3.  SKIN: No obvious rash, lesion, or ulcer.   LABORATORY PANEL:   CBC Recent Labs  Lab 11/14/18 0638  WBC 7.0  HGB 10.7*  HCT 34.4*  PLT 194   ------------------------------------------------------------------------------------------------------------------  Chemistries  Recent Labs  Lab 11/13/18 1827  NA 138  K 3.3*  CL 98  CO2 32  GLUCOSE 198*  BUN 35*  CREATININE 1.16  CALCIUM 8.5*  AST 62*  ALT 56*  ALKPHOS 151*  BILITOT 0.7   ------------------------------------------------------------------------------------------------------------------  Cardiac Enzymes Recent Labs  Lab 11/13/18 2118  TROPONINI 0.06*    ------------------------------------------------------------------------------------------------------------------  RADIOLOGY:  Dg Chest 2 View  Result Date: 11/13/2018 CLINICAL DATA:  83 year old male treated for pneumonia 1 month ago. Shortness of breath, on oxygen. EXAM: CHEST - 2 VIEW COMPARISON:  10/29/2018 and earlier. FINDINGS: Chronic cardiomegaly. Persistent pleural effusion(s) appears moderate size on the lateral. Continued abrupt termination of gas in the left mainstem bronchus and dense left lung base opacity. No superimposed pneumothorax. Pulmonary vascularity appears stable without overt edema. Paucity of bowel gas in the upper abdomen. No acute osseous abnormality identified. IMPRESSION: 1. Continued Moderate pleural effusion(s). 2. Continued dense left lower lobe collapse or consolidation. This may simply be atelectasis but pneumonia is difficult to exclude. Electronically Signed   By: Odessa Fleming M.D.   On: 11/13/2018 18:58   Ct Angio Chest Pe W And/or Wo Contrast  Result Date: 11/14/2018 CLINICAL DATA:  Initial evaluation for acute shortness of breath. History of CHF, COPD. EXAM: CT ANGIOGRAPHY CHEST WITH CONTRAST TECHNIQUE: Multidetector CT imaging of the chest was performed using the standard protocol during bolus administration of intravenous contrast. Multiplanar CT image reconstructions and MIPs were obtained to evaluate the vascular anatomy. CONTRAST:  ISOVUE-370 IOPAMIDOL (ISOVUE-370) INJECTION 76% COMPARISON:  Prior radiograph from earlier the same day. FINDINGS: Cardiovascular: Mild-to-moderate atherosclerotic change noted within the intrathoracic aorta. No aneurysm. Visualized great vessels grossly unremarkable. Moderate cardiomegaly with 3 vessel coronary artery calcifications. No pericardial effusion. Pulmonary arterial tree adequately opacified for evaluation. Main pulmonary artery dilated up to 3.5 cm in diameter. No filling defect to suggest acute pulmonary embolism.  Re-formatted imaging confirms these findings. Mediastinum/Nodes: Visualized thyroid within normal limits. Scattered enlarged pre tracheal/precarinal nodes measure up to 13 mm in short axis. 11 mm subcarinal node noted. Mild soft tissue fullness at the bilateral hila without discrete adenopathy. No axillary adenopathy. Esophagus within normal limits. Lungs/Pleura: Proximal airway patent. Large left with moderate right layering pleural effusions. Associated volume loss/atelectasis within the bilateral lower lobes, left greater than right. Dense opacity within the posterior left upper lobe also favored to reflect  atelectatic changes. Superimpose hazy ground-glass opacity within the aerated portions of the lungs likely reflects mild edema. Superimposed infection would be difficult to exclude, although is less favored. No pneumothorax. Few scattered superimposed tree-in-bud nodular densities noted within the subpleural right middle lobe, which could reflect mild small airways disease and/or mucoid impaction (series 6, image 33). Upper Abdomen: Visualized upper abdomen demonstrates no acute finding. Musculoskeletal: No acute osseous abnormality. No discrete lytic or blastic osseous lesions. Multilevel degenerative spondylolysis noted within the visualized spine. Gynecomastia noted. Mild diffuse anasarca within the subcutaneous soft tissues. Review of the MIP images confirms the above findings. IMPRESSION: 1. She no CT evidence for acute pulmonary embolism. 2. Large left and moderate right layering pleural effusions with associated volume loss/atelectasis within the bilateral lower lobes. Superimposed hazy ground-glass opacity within the aerated portions of the lungs likely reflects mild edema. Superimposed infection would be difficult to exclude, although is less favored. 3. Few superimposed scattered tree-in-bud nodular densities within the subpleural right middle lobe, which may reflect mild small airways disease and/or  mucoid impaction. 4. Enlarged mediastinal adenopathy as above, indeterminate, but may be reactive. 5. Cardiomegaly with 3 vessel coronary artery calcifications. Electronically Signed   By: Rise Mu M.D.   On: 11/14/2018 00:27      IMPRESSION AND PLAN:  #1.  Acute on chronic systolic and diastolic CHF.  The patient's last EF was 30 to 35% with grade 1 diastolic dysfunction within less than 6 months.  Therefore I will not repeat it for now.    He will be placed on IV Lasix. We will follow serial cardiac enzymes and obtain a cardiology consult by Dr. Gwen Pounds.  Will continue Aldactone.  He has minimally elevated LFTs that is likely related to congestive hepatopathy  2.  Lower respiratory infection with possible mucoid right lung plug.  His groundglass densities are more consistent with edema.  The patient will still be placed on IV Rocephin and p.o. Zithromax for now and will follow blood culture as well as obtain sputum Gram stain culture and sensitivity.  He will be placed on scheduled and as needed DuoNeb as well as Mucinex.  3.  Hypokalemia.  Potassium will be replaced and magnesium level will be checked.  4.  Hypertension.  Will continue Coreg.  5..  Dyslipidemia.  We will continue his statin therapy while following LFTs.  6.  Elevated fibrin derivative d-dimer.  His chest CTA was negative for PE.  Will obtain bilateral lower extremity venous duplex.  7.  DVT prophylaxis.  He will be on subcutaneous Lovenox.    All the records are reviewed and case discussed with ED provider. Management plans discussed with the patient and his questions were answered.  He agreed to proceed with the above-mentioned plan.  Further management will depend upon hospital course.  CODE STATUS: Presumably full code.  TOTAL TIME TAKING CARE OF THIS PATIENT: 65 minutes.    Hannah Beat M.D on 11/14/2018 at 7:04 AM  Pager - [phone]  After 6pm go to [email or web address] - Social research officer, government  [facility]  Office  [phone]  CC: Primary care physician; [name], MD   Note: This dictation was prepared with Dragon dictation along with smaller phrase technology. Any transcriptional errors that result from this process are unintentional.

## 2018-11-14 NOTE — Progress Notes (Signed)
Pharmacy lovenox dose adjustment Lovenox increased from 40 mg daily to 40 mg bid for BMI > 40 and CrCl > 30 ml/min  Thomasene Ripple, PharmD, BCPS Clinical Pharmacist 11/14/2018

## 2018-11-14 NOTE — ED Notes (Signed)
ED TO INPATIENT HANDOFF REPORT  ED Nurse Name and Phone #: Raphael Gibney Name/Age/Gender Dylan [name] 83 y.o. male Room/Bed: ED24A/ED24A  Code Status   Code Status: Full Code  Home/SNF/Other Home Patient oriented to: self, place, time and situation Is this baseline? Yes   Triage Complete: Triage complete  Chief Complaint Sob  Triage Note Patient arrived by EMS from home for SOB. Wears 1L O2 as needed. Was diagnosed with pneumonia about a month ago. HX CHF, CPOD. Patient reports he is bedbound since operation on knee in September 2019.    Allergies No Known Allergies  Level of Care/Admitting Diagnosis ED Disposition    ED Disposition Condition Comment   Admit  Hospital Area: [facility] [[id number]]  Level of Care: Med-Surg [16]  Diagnosis: Acute CHF (congestive heart failure) [facility]) [[id number]]  Admitting Physician: Hannah Beat [[id number]]  Attending Physician: Hannah Beat [[id number]]  Estimated length of stay: past midnight tomorrow  Certification:: I certify this patient will need inpatient services for at least 2 midnights  PT Class (Do Not Modify): Inpatient [101]  PT Acc Code (Do Not Modify): Private [1]       B Medical/Surgery History Past Medical History:  Diagnosis Date  . CAD (coronary artery disease)   . Cervicalgia   . CHF (congestive heart failure) (HCC)   . COPD (chronic obstructive pulmonary disease) (HCC)   . Diastolic heart failure (HCC)   . Foot drop, right   . History of kidney stones   . Hyperlipidemia    unspecified  . Hypertension   . Myocardial infarction (HCC)   . Osteoarthritis   . Shoulder pain, left   . Sleep apnea   . Tremor, essential    Past Surgical History:  Procedure Laterality Date  . APPLICATION OF WOUND VAC Right 11/13/2017   Procedure: APPLICATION OF WOUND VAC;  Surgeon: Annice Needy, MD;  Location: [facility] ORS;  Service: Vascular;  Laterality: Right;  . BACK SURGERY  06/2010  . CARDIAC CATHETERIZATION Left  04/30/2016   Procedure: Left Heart Cath and Coronary Angiography;  Surgeon: Laurier Nancy, MD;  Location: [facility] INVASIVE CV LAB;  Service: Cardiovascular;  Laterality: Left;  . COLONOSCOPY    . CORONARY ANGIOPLASTY    . CORONARY/GRAFT ACUTE MI REVASCULARIZATION N/A 06/21/2018   Procedure: Coronary/Graft Acute MI Revascularization;  Surgeon: Iran Ouch, MD;  Location: [facility] INVASIVE CV LAB;  Service: Cardiovascular;  Laterality: N/A;  . KNEE ARTHROPLASTY Left 06/01/2018   Procedure: COMPUTER ASSISTED TOTAL KNEE ARTHROPLASTY;  Surgeon: Donato Heinz, MD;  Location: [facility] ORS;  Service: Orthopedics;  Laterality: Left;  . KNEE ARTHROSCOPY Left   . KNEE SURGERY Left 1998  . LEFT HEART CATH AND CORONARY ANGIOGRAPHY N/A 06/21/2018   Procedure: LEFT HEART CATH AND CORONARY ANGIOGRAPHY;  Surgeon: Iran Ouch, MD;  Location: [facility] INVASIVE CV LAB;  Service: Cardiovascular;  Laterality: N/A;  . ORIF FEMUR FRACTURE Left 08/27/2018   Procedure: OPEN REDUCTION INTERNAL FIXATION (ORIF) SUPRACONDYLAR FEMUR FRACTURE ABOVE PROSTHESIS, QUADRICEPS REPAIR;  Surgeon: Kennedy Bucker, MD;  Location: [facility] ORS;  Service: Orthopedics;  Laterality: Left;  . TONSILLECTOMY    . WOUND DEBRIDEMENT Right 11/13/2017   Procedure: DEBRIDEMENT WOUND;  Surgeon: Annice Needy, MD;  Location: [facility] ORS;  Service: Vascular;  Laterality: Right;     A IV Location/Drains/Wounds Patient Lines/Drains/Airways Status   Active Line/Drains/Airways    Name:   Placement date:   Placement time:   Site:  Days:   Peripheral IV 10/27/18 Right Forearm   10/27/18    1523    Forearm   18   Negative Pressure Wound Therapy Leg Left   08/27/18    1438    -   79   Incision (Closed) 06/01/18 Knee Left   06/01/18    1555     166   Incision (Closed) 08/27/18 Leg Left   08/27/18    1501     79   Wound / Incision (Open or Dehisced) 08/26/18 Toe (Comment  which one) Right open wound   08/26/18    1456    Toe (Comment  which one)   80           Intake/Output Last 24 hours  Intake/Output Summary (Last 24 hours) at 11/14/2018 0332 Last data filed at 11/14/2018 0219 Gross per 24 hour  Intake -  Output 200 ml  Net -200 ml    Labs/Imaging Results for orders placed or performed during the hospital encounter of 11/13/18 (from the past 48 hour(s))  Comprehensive metabolic panel     Status: Abnormal   Collection Time: 11/13/18  6:27 PM  Result Value Ref Range   Sodium 138 135 - 145 mmol/L   Potassium 3.3 (L) 3.5 - 5.1 mmol/L   Chloride 98 98 - 111 mmol/L   CO2 32 22 - 32 mmol/L   Glucose, Bld 198 (H) 70 - 99 mg/dL   BUN 35 (H) 8 - 23 mg/dL   Creatinine, Ser 4.09 0.61 - 1.24 mg/dL   Calcium 8.5 (L) 8.9 - 10.3 mg/dL   Total Protein 5.9 (L) 6.5 - 8.1 g/dL   Albumin 2.7 (L) 3.5 - 5.0 g/dL   AST 62 (H) 15 - 41 U/L   ALT 56 (H) 0 - 44 U/L   Alkaline Phosphatase 151 (H) 38 - 126 U/L   Total Bilirubin 0.7 0.3 - 1.2 mg/dL   GFR calc non Af Amer 59 (L) >60 mL/min   GFR calc Af Amer >60 >60 mL/min   Anion gap 8 5 - 15    Comment: Performed at [facility], [address] [place], [place] [address]  Troponin I - Once     Status: Abnormal   Collection Time: 11/13/18  6:27 PM  Result Value Ref Range   Troponin I 0.06 (HH) <0.03 ng/mL    Comment: CRITICAL RESULT CALLED TO, READ BACK BY AND VERIFIED WITH DEE [facility] RN AT 1905 11/13/2018. MSS Performed at [facility], [address] [place], [place] [address]   CBC with Differential     Status: Abnormal   Collection Time: 11/13/18  6:27 PM  Result Value Ref Range   WBC 5.8 4.0 - 10.5 K/uL   RBC 3.39 (L) 4.22 - 5.81 MIL/uL   Hemoglobin 10.3 (L) 13.0 - 17.0 g/dL   HCT 56.2 (L) 13.0 - 86.5 %   MCV 98.5 80.0 - 100.0 fL   MCH 30.4 26.0 - 34.0 pg   MCHC 30.8 30.0 - 36.0 g/dL   RDW 78.4 (H) 69.6 - 29.5 %   Platelets 213 150 - 400 K/uL   nRBC 0.0 0.0 - 0.2 %   Neutrophils Relative % 66 %   Neutro Abs 3.9 1.7 - 7.7 K/uL   Lymphocytes Relative 21 %   Lymphs  Abs 1.2 0.7 - 4.0 K/uL   Monocytes Relative 6 %   Monocytes Absolute 0.4 0.1 - 1.0 K/uL   Eosinophils Relative 5 %  Eosinophils Absolute 0.3 0.0 - 0.5 K/uL   Basophils Relative 1 %   Basophils Absolute 0.0 0.0 - 0.1 K/uL   Immature Granulocytes 1 %   Abs Immature Granulocytes 0.04 0.00 - 0.07 K/uL    Comment: Performed at [facility], [address], [place], [place] [address]  Fibrin derivatives D-Dimer     Status: Abnormal   Collection Time: 11/13/18  6:27 PM  Result Value Ref Range   Fibrin derivatives D-dimer ([facility]) 1,693.25 (H) 0.00 - 499.00 ng/mL (FEU)    Comment: (NOTE) <> Exclusion of Venous Thromboembolism (VTE) - OUTPATIENT ONLY   (Emergency Department or [place])   0-499 ng/ml (FEU): With a low to intermediate pretest probability                      for VTE this test result excludes the diagnosis                      of VTE.   >499 ng/ml (FEU) : VTE not excluded; additional work up for VTE is                      required. <> Testing on Inpatients and Evaluation of Disseminated Intravascular   Coagulation (DIC) Reference Range:   0-499 ng/ml (FEU) Performed at [facility], [address] [place], [place] [address]   Brain natriuretic peptide     Status: Abnormal   Collection Time: 11/13/18  6:28 PM  Result Value Ref Range   B Natriuretic Peptide 757.0 (H) 0.0 - 100.0 pg/mL    Comment: Performed at [facility], [address] [place], [place] [address]  Troponin I - Once     Status: Abnormal   Collection Time: 11/13/18  9:18 PM  Result Value Ref Range   Troponin I 0.06 (HH) <0.03 ng/mL    Comment: CRITICAL VALUE NOTED. VALUE IS CONSISTENT WITH PREVIOUSLY REPORTED/CALLED VALUE [facility] Performed at [facility], [address] [place], [place] [address]    Dg Chest 2 View  Result Date: 11/13/2018 CLINICAL DATA:  83 year old male treated for pneumonia 1 month ago. Shortness of breath, on oxygen. EXAM: CHEST - 2 VIEW  COMPARISON:  10/29/2018 and earlier. FINDINGS: Chronic cardiomegaly. Persistent pleural effusion(s) appears moderate size on the lateral. Continued abrupt termination of gas in the left mainstem bronchus and dense left lung base opacity. No superimposed pneumothorax. Pulmonary vascularity appears stable without overt edema. Paucity of bowel gas in the upper abdomen. No acute osseous abnormality identified. IMPRESSION: 1. Continued Moderate pleural effusion(s). 2. Continued dense left lower lobe collapse or consolidation. This may simply be atelectasis but pneumonia is difficult to exclude. Electronically Signed   By: Odessa Fleming M.D.   On: 11/13/2018 18:58   Ct Angio Chest Pe W And/or Wo Contrast  Result Date: 11/14/2018 CLINICAL DATA:  Initial evaluation for acute shortness of breath. History of CHF, COPD. EXAM: CT ANGIOGRAPHY CHEST WITH CONTRAST TECHNIQUE: Multidetector CT imaging of the chest was performed using the standard protocol during bolus administration of intravenous contrast. Multiplanar CT image reconstructions and MIPs were obtained to evaluate the vascular anatomy. CONTRAST:  ISOVUE-370 IOPAMIDOL (ISOVUE-370) INJECTION 76% COMPARISON:  Prior radiograph from earlier the same day. FINDINGS: Cardiovascular: Mild-to-moderate atherosclerotic change noted within the intrathoracic aorta. No aneurysm. Visualized great vessels grossly unremarkable. Moderate cardiomegaly with 3 vessel coronary artery calcifications. No pericardial effusion. Pulmonary arterial tree adequately opacified for evaluation.  Main pulmonary artery dilated up to 3.5 cm in diameter. No filling defect to suggest acute pulmonary embolism. Re-formatted imaging confirms these findings. Mediastinum/Nodes: Visualized thyroid within normal limits. Scattered enlarged pre tracheal/precarinal nodes measure up to 13 mm in short axis. 11 mm subcarinal node noted. Mild soft tissue fullness at the bilateral hila without discrete adenopathy. No  axillary adenopathy. Esophagus within normal limits. Lungs/Pleura: Proximal airway patent. Large left with moderate right layering pleural effusions. Associated volume loss/atelectasis within the bilateral lower lobes, left greater than right. Dense opacity within the posterior left upper lobe also favored to reflect atelectatic changes. Superimpose hazy ground-glass opacity within the aerated portions of the lungs likely reflects mild edema. Superimposed infection would be difficult to exclude, although is less favored. No pneumothorax. Few scattered superimposed tree-in-bud nodular densities noted within the subpleural right middle lobe, which could reflect mild small airways disease and/or mucoid impaction (series 6, image 33). Upper Abdomen: Visualized upper abdomen demonstrates no acute finding. Musculoskeletal: No acute osseous abnormality. No discrete lytic or blastic osseous lesions. Multilevel degenerative spondylolysis noted within the visualized spine. Gynecomastia noted. Mild diffuse anasarca within the subcutaneous soft tissues. Review of the MIP images confirms the above findings. IMPRESSION: 1. She no CT evidence for acute pulmonary embolism. 2. Large left and moderate right layering pleural effusions with associated volume loss/atelectasis within the bilateral lower lobes. Superimposed hazy ground-glass opacity within the aerated portions of the lungs likely reflects mild edema. Superimposed infection would be difficult to exclude, although is less favored. 3. Few superimposed scattered tree-in-bud nodular densities within the subpleural right middle lobe, which may reflect mild small airways disease and/or mucoid impaction. 4. Enlarged mediastinal adenopathy as above, indeterminate, but may be reactive. 5. Cardiomegaly with 3 vessel coronary artery calcifications. Electronically Signed   By: Rise Mu M.D.   On: 11/14/2018 00:27    Pending Labs Unresulted Labs (From admission, onward)     Start     Ordered   11/15/18 0500  Basic metabolic panel  Daily,   STAT     11/14/18 0314   11/14/18 0500  CBC WITH DIFFERENTIAL  Once,   STAT     11/14/18 0314   11/13/18 1833  Lactic acid, plasma  Now then every 2 hours,   STAT     11/13/18 1833   11/13/18 1825  Urinalysis, Complete w Microscopic  ONCE - STAT,   STAT     11/13/18 1825          Vitals/Pain Today's Vitals   11/14/18 0245 11/14/18 0300 11/14/18 0315 11/14/18 0330  BP:  (!) 153/89  (!) 174/87  Pulse: 67 73 74 69  Resp: (!) 26 (!) 26 (!) 25 20  Temp:      TempSrc:      SpO2: 100% 96% 97% 93%  Weight:      Height:      PainSc:        Isolation Precautions No active isolations  Medications Medications  aspirin EC tablet 81 mg (has no administration in time range)  atorvastatin (LIPITOR) tablet 80 mg (has no administration in time range)  carvedilol (COREG) tablet 6.25 mg (has no administration in time range)  isosorbide mononitrate (IMDUR) 24 hr tablet 30 mg (has no administration in time range)  nitroGLYCERIN (NITROSTAT) SL tablet 0.4 mg (has no administration in time range)  sacubitril-valsartan (ENTRESTO) 24-26 mg per tablet (has no administration in time range)  spironolactone (ALDACTONE) tablet 12.5 mg (has no administration in time  range)  alum & mag hydroxide-simeth (MAALOX/MYLANTA) 200-200-20 MG/5ML suspension 30 mL (has no administration in time range)  bisacodyl (DULCOLAX) suppository 10 mg (has no administration in time range)  magnesium hydroxide (MILK OF MAGNESIA) suspension 30 mL (has no administration in time range)  polyethylene glycol (MIRALAX / GLYCOLAX) packet 17 g (has no administration in time range)  saccharomyces boulardii (FLORASTOR) capsule 250 mg (has no administration in time range)  senna-docusate (Senokot-S) tablet 2 tablet (has no administration in time range)  tamsulosin (FLOMAX) capsule 0.4 mg (has no administration in time range)  clopidogrel (PLAVIX) tablet 75 mg (has no  administration in time range)  Melatonin TABS 5 mg (has no administration in time range)  gabapentin (NEURONTIN) capsule 400 mg (has no administration in time range)  primidone (MYSOLINE) tablet 200 mg (has no administration in time range)  multivitamin with minerals tablet 1 tablet (has no administration in time range)  cholecalciferol (VITAMIN D3) tablet 1,000 Units (has no administration in time range)  ascorbic acid (VITAMIN C) 500 MG/5ML syrup 500 mg (has no administration in time range)  benzonatate (TESSALON) capsule 200 mg (has no administration in time range)  ipratropium-albuterol (DUONEB) 0.5-2.5 (3) MG/3ML nebulizer solution 3 mL (has no administration in time range)  loratadine (CLARITIN) tablet 10 mg (has no administration in time range)  ketotifen (ZADITOR) 0.025 % ophthalmic solution 1 drop (has no administration in time range)  sodium chloride flush (NS) 0.9 % injection 3 mL (has no administration in time range)  sodium chloride flush (NS) 0.9 % injection 3 mL (has no administration in time range)  0.9 %  sodium chloride infusion (has no administration in time range)  acetaminophen (TYLENOL) tablet 650 mg (has no administration in time range)  ondansetron (ZOFRAN) injection 4 mg (has no administration in time range)  enoxaparin (LOVENOX) injection 40 mg (has no administration in time range)  furosemide (LASIX) injection 40 mg (has no administration in time range)  zolpidem (AMBIEN) tablet 5 mg (has no administration in time range)  cefTRIAXone (ROCEPHIN) 1 g in sodium chloride 0.9 % 100 mL IVPB (has no administration in time range)  azithromycin (ZITHROMAX) tablet 500 mg (has no administration in time range)    Followed by  azithromycin (ZITHROMAX) tablet 250 mg (has no administration in time range)  guaiFENesin (MUCINEX) 12 hr tablet 600 mg (has no administration in time range)  ipratropium-albuterol (DUONEB) 0.5-2.5 (3) MG/3ML nebulizer solution 3 mL (has no administration  in time range)  ipratropium-albuterol (DUONEB) 0.5-2.5 (3) MG/3ML nebulizer solution 3 mL (has no administration in time range)  haloperidol (HALDOL) tablet 0.5 mg (0.5 mg Oral Given 11/13/18 2229)  iopamidol (ISOVUE-370) 76 % injection 100 mL (100 mLs Intravenous Contrast Given 11/13/18 2328)    Mobility non-ambulatory High fall risk   Focused Assessments Pulmonary Assessment Handoff:  Lung sounds: Bilateral Breath Sounds: Diminished L Breath Sounds: Diminished R Breath Sounds: Diminished O2 Device: Room Air O2 Flow Rate (L/min): 1 L/min      R Recommendations: See Admitting Provider Note  Report given to:   Additional Notes: none

## 2018-11-14 NOTE — Consult Note (Addendum)
Pulmonary Medicine          Date: 11/14/2018,   MRN# [id number] Dylan [name] 07-Jul-1936     AdmissionWeight: 125.6 kg                 CurrentWeight: 134.8 kg      CHIEF COMPLAINT:   Worsening dyspnea at rest   HISTORY OF PRESENT ILLNESS   This is a pleasant octogenarian (83-year-old) with a history of systolic and diastolic CHF with an EF of less than 35% he is followed by cardiology.  Patient has a distant history of smoking a diagnosis of COPD however is not followed by pulmonary.  He also has a history of neuropathy essential hypertension lumbar go previous admission for CHF exacerbation in October 2019 requiring ICU stay and 5 days ago for similar presentation treated for acute decompensated systolic and diastolic heart failure as well as community-acquired pneumonia..  Has chronic stasis dermatitis without diabetes and CKD stage III although most recent creatinine has been normal.  Patient reports worsening dyspnea over the last week, however denies fevers, myalgias, malaise, cough or any lower respiratory tract symptoms.  He states that he feels his dyspnea was secondary to panic attacks but denies a history of anxiety in the past.  Patient does endorse nocturnal diaphoresis and chills but is unsure of chronicity.  CT chest ordered for possible pulmonary venous thromboembolism was positive for bilateral pleural effusions and bibasilar atelectasis as well as significant hilar and mediastinal lymphadenopathy.  PAST MEDICAL HISTORY   Past Medical History:  Diagnosis Date   CAD (coronary artery disease)    Cervicalgia    CHF (congestive heart failure) (HCC)    COPD (chronic obstructive pulmonary disease) (HCC)    Diastolic heart failure (HCC)    Foot drop, right    History of kidney stones    Hyperlipidemia    unspecified   Hypertension    Myocardial infarction [facility])    Osteoarthritis    Shoulder pain, left    Sleep apnea    Tremor, essential      SURGICAL  HISTORY   Past Surgical History:  Procedure Laterality Date   APPLICATION OF WOUND VAC Right 11/13/2017   Procedure: APPLICATION OF WOUND VAC;  Surgeon: Annice Needy, MD;  Location: [facility] OR[facility];  Service: Vascular;  Laterality: Right;   BACK SURGERY  06/2010   CARDIAC CATHETERIZATION Left 04/30/2016   Procedure: Left Heart Cath and Coronary Angiography;  Surgeon: Laurier Nancy, MD;  Location: [facility];  Service: Cardiovascular;  Laterality: Left;   COLONOSCOPY     CORONARY ANGIOPLASTY     CORONARY/GRAFT ACUTE MI REVASCULARIZATION N/A 06/21/2018   Procedure: Coronary/Graft Acute MI Revascularization;  Surgeon: Iran Ouch, MD;  Location: [facility];  Service: Cardiovascular;  Laterality: N/A;   KNEE ARTHROPLASTY Left 06/01/2018   Procedure: COMPUTER ASSISTED TOTAL KNEE ARTHROPLASTY;  Surgeon: Donato Heinz, MD;  Location: [facility];  Service: Orthopedics;  Laterality: Left;   KNEE ARTHROSCOPY Left    KNEE SURGERY Left 1998   LEFT HEART CATH AND CORONARY ANGIOGRAPHY N/A 06/21/2018   Procedure: LEFT HEART CATH AND CORONARY ANGIOGRAPHY;  Surgeon: Iran Ouch, MD;  Location: [facility];  Service: Cardiovascular;  Laterality: N/A;   ORIF FEMUR FRACTURE Left 08/27/2018   Procedure: OPEN REDUCTION INTERNAL FIXATION (ORIF) SUPRACONDYLAR FEMUR FRACTURE ABOVE PROSTHESIS, QUADRICEPS REPAIR;  Surgeon: Kennedy Bucker, MD;  Location: [facility];  Service: Orthopedics;  Laterality:  Left;   TONSILLECTOMY     WOUND DEBRIDEMENT Right 11/13/2017   Procedure: DEBRIDEMENT WOUND;  Surgeon: Annice Needy, MD;  Location: [facility];  Service: Vascular;  Laterality: Right;     FAMILY HISTORY   Family History  Problem Relation Age of Onset   Diabetes Son    Cancer Mother    Colon cancer Mother    Lung cancer Mother    Tremor Father    Heart disease Father    Tremor Brother    Bladder Cancer Brother    Tremor Sister      SOCIAL HISTORY   Social  History   Tobacco Use   Smoking status: Former Smoker    Packs/day: 0.25    Years: 58.00    Pack years: 14.50    Types: Cigarettes   Smokeless tobacco: Former Neurosurgeon   Tobacco comment: quit the begginning of septembet 2019  Substance Use Topics   Alcohol use: No    Alcohol/week: 0.0 standard drinks   Drug use: No     MEDICATIONS    Home Medication:    Current Medication:  Current Facility-Administered Medications:    0.9 %  sodium chloride infusion, 250 mL, Intravenous, PRN, Mansy, Jan A, MD, Last Rate: 10 mL/hr at 11/14/18 0603, 250 mL at 11/14/18 1610   acetaminophen (TYLENOL) tablet 650 mg, 650 mg, Oral, Q4H PRN, Mansy, Jan A, MD   alum & mag hydroxide-simeth (MAALOX/MYLANTA) 200-200-20 MG/5ML suspension 30 mL, 30 mL, Oral, Q4H PRN, Mansy, Jan A, MD   aspirin EC tablet 81 mg, 81 mg, Oral, Daily, Mansy, Jan A, MD   atorvastatin (LIPITOR) tablet 80 mg, 80 mg, Oral, Daily, Mansy, Jan A, MD   azithromycin (ZITHROMAX) tablet 500 mg, 500 mg, Oral, Daily **FOLLOWED BY** [START ON 11/15/2018] azithromycin (ZITHROMAX) tablet 250 mg, 250 mg, Oral, Daily, Mansy, Jan A, MD   benzonatate (TESSALON) capsule 200 mg, 200 mg, Oral, TID PRN, Mansy, Jan A, MD   bisacodyl (DULCOLAX) suppository 10 mg, 10 mg, Rectal, PRN, Mansy, Jan A, MD   carvedilol (COREG) tablet 6.25 mg, 6.25 mg, Oral, BID WC, Mansy, Jan A, MD   [START ON 11/15/2018] cefTRIAXone (ROCEPHIN) 1 g in sodium chloride 0.9 % 100 mL IVPB, 1 g, Intravenous, Q24H, Mansy, Jan A, MD   cholecalciferol (VITAMIN D3) tablet 1,000 Units, 1,000 Units, Oral, Daily, Mansy, Jan A, MD   clopidogrel (PLAVIX) tablet 75 mg, 75 mg, Oral, Daily, Mansy, Jan A, MD   enoxaparin (LOVENOX) injection 40 mg, 40 mg, Subcutaneous, BID, Mansy, Jan A, MD   furosemide (LASIX) injection 40 mg, 40 mg, Intravenous, Q12H, Lynann Beaver M, RN   gabapentin (NEURONTIN) capsule 400 mg, 400 mg, Oral, QHS, Mansy, Jan A, MD   guaiFENesin (MUCINEX) 12 hr  tablet 600 mg, 600 mg, Oral, BID, Mansy, Jan A, MD   ipratropium-albuterol (DUONEB) 0.5-2.5 (3) MG/3ML nebulizer solution 3 mL, 3 mL, Nebulization, Q6H PRN, Mansy, Jan A, MD   ipratropium-albuterol (DUONEB) 0.5-2.5 (3) MG/3ML nebulizer solution 3 mL, 3 mL, Nebulization, QID, Mansy, Jan A, MD, 3 mL at 11/14/18 0759   ipratropium-albuterol (DUONEB) 0.5-2.5 (3) MG/3ML nebulizer solution 3 mL, 3 mL, Nebulization, Q4H PRN, Mansy, Jan A, MD   isosorbide mononitrate (IMDUR) 24 hr tablet 30 mg, 30 mg, Oral, Daily, Mansy, Jan A, MD   ketotifen (ZADITOR) 0.025 % ophthalmic solution 1 drop, 1 drop, Both Eyes, BID, Mansy, Jan A, MD   loratadine (CLARITIN) tablet 10 mg, 10 mg, Oral, Daily PRN,  Mansy, Jan A, MD   magnesium hydroxide (MILK OF MAGNESIA) suspension 30 mL, 30 mL, Oral, Q4H PRN, Mansy, Vernetta Honey, MD   Melatonin TABS 5 mg, 5 mg, Oral, QHS, Mansy, Jan A, MD   multivitamin with minerals tablet 1 tablet, 1 tablet, Oral, Daily, Mansy, Jan A, MD   nitroGLYCERIN (NITROSTAT) SL tablet 0.4 mg, 0.4 mg, Sublingual, Q5 min PRN, Mansy, Jan A, MD   ondansetron [facility]) injection 4 mg, 4 mg, Intravenous, Q6H PRN, Mansy, Jan A, MD   polyethylene glycol (MIRALAX / GLYCOLAX) packet 17 g, 17 g, Oral, BID PRN, Mansy, Jan A, MD   potassium chloride SA (K-DUR,KLOR-CON) CR tablet 40 mEq, 40 mEq, Oral, Once, Mansy, Jan A, MD   primidone (MYSOLINE) tablet 200 mg, 200 mg, Oral, BID, Mansy, Jan A, MD   saccharomyces boulardii (FLORASTOR) capsule 250 mg, 250 mg, Oral, BID, Mansy, Jan A, MD   sacubitril-valsartan (ENTRESTO) 24-26 mg per tablet, 1 tablet, Oral, BID, Mansy, Jan A, MD   senna-docusate (Senokot-S) tablet 2 tablet, 2 tablet, Oral, BID PRN, Mansy, Jan A, MD   sodium chloride flush (NS) 0.9 % injection 3 mL, 3 mL, Intravenous, Q12H, Mansy, Jan A, MD, 3 mL at 11/14/18 0610   sodium chloride flush (NS) 0.9 % injection 3 mL, 3 mL, Intravenous, PRN, Mansy, Jan A, MD   spironolactone (ALDACTONE) tablet  12.5 mg, 12.5 mg, Oral, Daily, Mansy, Jan A, MD   tamsulosin (FLOMAX) capsule 0.4 mg, 0.4 mg, Oral, Daily, Mansy, Jan A, MD   vitamin C (ASCORBIC ACID) tablet 500 mg, 500 mg, Oral, Daily, Mansy, Jan A, MD   zolpidem (AMBIEN) tablet 5 mg, 5 mg, Oral, QHS PRN, Mansy, Vernetta Honey, MD    ALLERGIES   Patient has no known allergies.     REVIEW OF SYSTEMS    Review of Systems:  Gen:  Denies  fever, sweats, chills weigh loss  HEENT: Denies blurred vision, double vision, ear pain, eye pain, hearing loss, nose bleeds, sore throat Cardiac:  No dizziness, chest pain or heaviness, chest tightness,edema Resp:   Denies cough or sputum porduction, shortness of breath,wheezing, hemoptysis,  Gi: Denies swallowing difficulty, stomach pain, nausea or vomiting, diarrhea, constipation, bowel incontinence Gu:  Denies bladder incontinence, burning urine Ext:   Denies Joint pain, stiffness or swelling Skin: Denies  skin rash, easy bruising or bleeding or hives Endoc:  Denies polyuria, polydipsia , polyphagia or weight change Psych:   Denies depression, insomnia or hallucinations   Other:  All other systems negative   VS: BP 140/71 (BP Location: Right Arm)    Pulse 65    Temp 36.5 C (Oral)    Resp 18    Ht 6' (1.829 m)    Wt 134.8 kg    SpO2 99%    BMI 40.29 kg/m      PHYSICAL EXAM    GENERAL:NAD, no fevers, chills, no weakness no fatigue HEAD: Normocephalic, atraumatic.  EYES: Pupils equal, round, reactive to light. Extraocular muscles intact. No scleral icterus.  MOUTH: Moist mucosal membrane. Dentition intact. No abscess noted.  EAR, NOSE, THROAT: Clear without exudates. No external lesions.  NECK: Supple. No thyromegaly. No nodules. No JVD.  PULMONARY: Diffuse coarse rhonchi right sided +wheezes CARDIOVASCULAR: S1 and S2. Regular rate and rhythm. No murmurs, rubs, or gallops. No edema. Pedal pulses 2+ bilaterally.  GASTROINTESTINAL: Soft, nontender, nondistended. No masses. Positive bowel  sounds. No hepatosplenomegaly.  MUSCULOSKELETAL: No swelling, clubbing, or edema. Range of motion full in  all extremities.  NEUROLOGIC: Cranial nerves II through XII are intact. No gross focal neurological deficits. Sensation intact. Reflexes intact.  SKIN: No ulceration, lesions, rashes, or cyanosis. Skin warm and dry. Turgor intact.  PSYCHIATRIC: Mood, affect within normal limits. The patient is awake, alert and oriented x 3. Insight, judgment intact.       IMAGING       Dg Chest 2 View  Result Date: 11/13/2018 CLINICAL DATA:  83 year old male treated for pneumonia 1 month ago. Shortness of breath, on oxygen. EXAM: CHEST - 2 VIEW COMPARISON:  10/29/2018 and earlier. FINDINGS: Chronic cardiomegaly. Persistent pleural effusion(s) appears moderate size on the lateral. Continued abrupt termination of gas in the left mainstem bronchus and dense left lung base opacity. No superimposed pneumothorax. Pulmonary vascularity appears stable without overt edema. Paucity of bowel gas in the upper abdomen. No acute osseous abnormality identified. IMPRESSION: 1. Continued Moderate pleural effusion(s). 2. Continued dense left lower lobe collapse or consolidation. This may simply be atelectasis but pneumonia is difficult to exclude. Electronically Signed   By: Odessa Fleming M.D.   On: 11/13/2018 18:58   Dg Chest 2 View  Result Date: 10/29/2018 CLINICAL DATA:  CHF pneumonia EXAM: CHEST - 2 VIEW COMPARISON:  10/27/2018 FINDINGS: AP and lateral views of the chest are limited by positioning. Low lung volumes. The cardio pericardial silhouette is enlarged. Persistent bibasilar collapse/consolidation, left greater than right with bilateral moderate pleural effusions. Interstitial pulmonary edema pattern persists. The visualized bony structures of the thorax are intact. Telemetry leads overlie the chest. IMPRESSION: No substantial change in exam. Electronically Signed   By: Kennith Center M.D.   On: 10/29/2018 15:54   Dg  Chest 2 View  Result Date: 10/27/2018 CLINICAL DATA:  Shortness of breath today. EXAM: CHEST - 2 VIEW COMPARISON:  Single-view of the chest 01/10/2018, 08/30/2018 and 09/01/2018. FINDINGS: Left worse than right effusions and airspace disease are seen. Aeration in the right lung base appears mildly improved compared to the most recent examination. Cardiac silhouette is obscured. No pneumothorax. Aortic atherosclerosis noted. IMPRESSION: Left worse than right pleural effusions and airspace disease. Aeration in the right chest is mildly improved since the most recent examination. Atherosclerosis. Electronically Signed   By: Drusilla Kanner M.D.   On: 10/27/2018 14:48   Ct Angio Chest Pe W And/or Wo Contrast  Result Date: 11/14/2018 CLINICAL DATA:  Initial evaluation for acute shortness of breath. History of CHF, COPD. EXAM: CT ANGIOGRAPHY CHEST WITH CONTRAST TECHNIQUE: Multidetector CT imaging of the chest was performed using the standard protocol during bolus administration of intravenous contrast. Multiplanar CT image reconstructions and MIPs were obtained to evaluate the vascular anatomy. CONTRAST:  ISOVUE-370 IOPAMIDOL (ISOVUE-370) INJECTION 76% COMPARISON:  Prior radiograph from earlier the same day. FINDINGS: Cardiovascular: Mild-to-moderate atherosclerotic change noted within the intrathoracic aorta. No aneurysm. Visualized great vessels grossly unremarkable. Moderate cardiomegaly with 3 vessel coronary artery calcifications. No pericardial effusion. Pulmonary arterial tree adequately opacified for evaluation. Main pulmonary artery dilated up to 3.5 cm in diameter. No filling defect to suggest acute pulmonary embolism. Re-formatted imaging confirms these findings. Mediastinum/Nodes: Visualized thyroid within normal limits. Scattered enlarged pre tracheal/precarinal nodes measure up to 13 mm in short axis. 11 mm subcarinal node noted. Mild soft tissue fullness at the bilateral hila without discrete  adenopathy. No axillary adenopathy. Esophagus within normal limits. Lungs/Pleura: Proximal airway patent. Large left with moderate right layering pleural effusions. Associated volume loss/atelectasis within the bilateral lower lobes, left greater than right.  Dense opacity within the posterior left upper lobe also favored to reflect atelectatic changes. Superimpose hazy ground-glass opacity within the aerated portions of the lungs likely reflects mild edema. Superimposed infection would be difficult to exclude, although is less favored. No pneumothorax. Few scattered superimposed tree-in-bud nodular densities noted within the subpleural right middle lobe, which could reflect mild small airways disease and/or mucoid impaction (series 6, image 33). Upper Abdomen: Visualized upper abdomen demonstrates no acute finding. Musculoskeletal: No acute osseous abnormality. No discrete lytic or blastic osseous lesions. Multilevel degenerative spondylolysis noted within the visualized spine. Gynecomastia noted. Mild diffuse anasarca within the subcutaneous soft tissues. Review of the MIP images confirms the above findings. IMPRESSION: 1. She no CT evidence for acute pulmonary embolism. 2. Large left and moderate right layering pleural effusions with associated volume loss/atelectasis within the bilateral lower lobes. Superimposed hazy ground-glass opacity within the aerated portions of the lungs likely reflects mild edema. Superimposed infection would be difficult to exclude, although is less favored. 3. Few superimposed scattered tree-in-bud nodular densities within the subpleural right middle lobe, which may reflect mild small airways disease and/or mucoid impaction. 4. Enlarged mediastinal adenopathy as above, indeterminate, but may be reactive. 5. Cardiomegaly with 3 vessel coronary artery calcifications. Electronically Signed   By: Rise Mu M.D.   On: 11/14/2018 00:27      ASSESSMENT/PLAN   Acute hypoxic  respiratory failure   -Initially was 85% on room air has been gently diuresed and is now liter nasal cannula satting 90 to 93%  -Likely due to acute onset of systolic CHF with EF of 30 to 16%  -Would diurese aggressively for now as patient has creatinine within reference range-changing Lasix to 80 twice daily, ordering condom catheter and strict I's and O's for accurate measurements.  Decreased p.o. fluid intake to maximum 1200cc/24h.  -Pharmacy consultation for electrolyte imbalance while aggressively diuresing -Less likely to be infectious in etiology as patient has not been febrile, has WBC count within reference range has not had sick contacts or lower respiratory tract symptoms-currently empirically on Regimen with Rocephin and Zithromax.  May de-escalate as per pharmD -Cardiology on case - Dr Rollen Sox - appreciate input   COPD    -Agree with duo nebs for now.  No need for steroids patient is not in exacerbation.  Will optimize inhalers on outpatient basis   OSA   -Would recommend to provide BiPAP or CPAP nocturnally will serve both to treat sleep apnea as well as prevent compensatory hypercapnia from diuresis   -Have ordered V 60 BiPAP with 1 L O2 bleed in with goal O2 sats 90 to 94% -auto titrated as per RT discussed with Heather - respiratory therapy     Thank you for allowing me to participate in the care of this patient, will follow along with you.   Patient/Family are satisfied with care plan and all questions have been answered.  This document was prepared using Dragon voice recognition software and may include unintentional dictation errors.     Vida Rigger, M.D.  Division of Pulmonary & Critical Care Medicine  [facility]

## 2018-11-14 NOTE — Consult Note (Signed)
PHARMACY CONSULT NOTE - FOLLOW UP  Pharmacy Consult for Electrolyte Monitoring and Replacement   Recent Labs: Potassium (mmol/L)  Date Value  11/14/2018 3.2 (L)  12/02/2012 3.1 (L)   Magnesium (mg/dL)  Date Value  82/70/7867 1.8   Calcium (mg/dL)  Date Value  54/49/2010 8.6 (L)   Calcium, Total (mg/dL)  Date Value  03/13/1974 8.2 (L)   Albumin (g/dL)  Date Value  88/32/5498 2.6 (L)  12/02/2012 3.3 (L)   Sodium (mmol/L)  Date Value  11/14/2018 139  12/02/2012 142   Assessment: 83 YOM with an electrolyte imbalance while aggressively diuresing. He is currently receiving Lasix 80 mg IV q12h. Other medications that may affect potassium levels include spironolactone 12.5mg  daily and Entrest 24/26 mg BID  Plan:  ---oral KCl 40 mEq once has been prescribed by Dr Arville Care ---adding KCl 40 mEq BID while patient is on this regimen ---electrolyte levels have been ordered and pharmacy will replace as needed  Lowella Bandy ,PharmD Clinical Pharmacist 11/14/2018 9:55 AM

## 2018-11-14 NOTE — Progress Notes (Signed)
Same Day Progress note  Patient seen by cardiology: Continue IV lasix, monitor for AKI on CKD. Added Entresto. Consider thoracentesis if no response to diuresis. No further cardiac interventions. Start cardiac rehab.  Patient seen by pulmonology, per note: changing Lasix to 80 twice daily, ordering condom catheter and strict I's and O's for accurate measurements. Decreased p.o. fluid intake to maximum 1200cc/24h.  Pharmacy c/s for electrolyte management and for de-escalation of broad spectrum antibiotics. Per Pulmonology low suspicion infectious etiology. No need for steroids. Continue duonebs. BiPAP ordered nocturnally as well, RT following.   Lactate WNL. Troponin elevated to 0.06 cardiology aware.  [place] Lower extremity: no evidence of DVT within either extremity. CT chest no evidence of PE. Patient seen comfortable and eating lunch + conversational dyspnea + lower extremity edema. No other complaints. Exam already done. VSS. On RA when seen.  Appreciate consult inputs.   Stormy Fabian, PA

## 2018-11-14 NOTE — ED Notes (Signed)
Repositioned pt in bed

## 2018-11-14 NOTE — Progress Notes (Signed)
PHARMACY - PHYSICIAN COMMUNICATION CRITICAL VALUE ALERT - BLOOD CULTURE IDENTIFICATION (BCID)  Dylan [name] is an 83 y.o. male who presented to [facility] on 11/13/2018 with a chief complaint of Pneumonia  Assessment:  Blood culture with 1 of 4 bottles (aerobic) with GPC, BCID shows Staph species Methacillin R detected and Strep species  Name of physician (or Provider) Contacted: Marjie Skiff  Current antibiotics: Ceftriaxone and Azithromycin  Changes to prescribed antibiotics recommended:  Will order Vancomycin 2500mg  IV once and follow  Results for orders placed or performed during the hospital encounter of 11/13/18  Blood Culture ID Panel (Reflexed) (Collected: 11/14/2018  4:10 AM)  Result Value Ref Range   Enterococcus species NOT DETECTED NOT DETECTED   Listeria monocytogenes NOT DETECTED NOT DETECTED   Staphylococcus species DETECTED (A) NOT DETECTED   Staphylococcus aureus (BCID) NOT DETECTED NOT DETECTED   Methicillin resistance DETECTED (A) NOT DETECTED   Streptococcus species DETECTED (A) NOT DETECTED   Streptococcus agalactiae NOT DETECTED NOT DETECTED   Streptococcus pneumoniae NOT DETECTED NOT DETECTED   Streptococcus pyogenes NOT DETECTED NOT DETECTED   Acinetobacter baumannii NOT DETECTED NOT DETECTED   Enterobacteriaceae species NOT DETECTED NOT DETECTED   Enterobacter cloacae complex NOT DETECTED NOT DETECTED   Escherichia coli NOT DETECTED NOT DETECTED   Klebsiella oxytoca NOT DETECTED NOT DETECTED   Klebsiella pneumoniae NOT DETECTED NOT DETECTED   Proteus species NOT DETECTED NOT DETECTED   Serratia marcescens NOT DETECTED NOT DETECTED   Haemophilus influenzae NOT DETECTED NOT DETECTED   Neisseria meningitidis NOT DETECTED NOT DETECTED   Pseudomonas aeruginosa NOT DETECTED NOT DETECTED   Candida albicans NOT DETECTED NOT DETECTED   Candida glabrata NOT DETECTED NOT DETECTED   Candida krusei NOT DETECTED NOT DETECTED   Candida parapsilosis NOT DETECTED NOT  DETECTED   Candida tropicalis NOT DETECTED NOT DETECTED    Foye Deer 11/14/2018  10:17 PM

## 2018-11-15 LAB — BLOOD CULTURE ID PANEL (REFLEXED)
Acinetobacter baumannii: NOT DETECTED
Candida albicans: NOT DETECTED
Candida glabrata: NOT DETECTED
Candida krusei: NOT DETECTED
Candida parapsilosis: NOT DETECTED
Candida tropicalis: NOT DETECTED
Enterobacter cloacae complex: NOT DETECTED
Enterobacteriaceae species: NOT DETECTED
Enterococcus species: NOT DETECTED
Escherichia coli: NOT DETECTED
HAEMOPHILUS INFLUENZAE: NOT DETECTED
Klebsiella oxytoca: NOT DETECTED
Klebsiella pneumoniae: NOT DETECTED
Listeria monocytogenes: NOT DETECTED
METHICILLIN RESISTANCE: DETECTED — AB
NEISSERIA MENINGITIDIS: NOT DETECTED
Proteus species: NOT DETECTED
Pseudomonas aeruginosa: NOT DETECTED
STREPTOCOCCUS AGALACTIAE: NOT DETECTED
STREPTOCOCCUS SPECIES: DETECTED — AB
Serratia marcescens: NOT DETECTED
Staphylococcus aureus (BCID): NOT DETECTED
Staphylococcus species: DETECTED — AB
Streptococcus pneumoniae: NOT DETECTED
Streptococcus pyogenes: NOT DETECTED

## 2018-11-15 LAB — BASIC METABOLIC PANEL
Anion gap: 8 (ref 5–15)
BUN: 30 mg/dL — ABNORMAL HIGH (ref 8–23)
CO2: 32 mmol/L (ref 22–32)
Calcium: 8.4 mg/dL — ABNORMAL LOW (ref 8.9–10.3)
Chloride: 102 mmol/L (ref 98–111)
Creatinine, Ser: 1.13 mg/dL (ref 0.61–1.24)
GFR calc Af Amer: >60 mL/min (ref >60)
GFR calc non Af Amer: >60 mL/min (ref >60)
Glucose, Bld: 104 mg/dL — ABNORMAL HIGH (ref 70–99)
Potassium: 4.1 mmol/L (ref 3.5–5.1)
Sodium: 142 mmol/L (ref 135–145)

## 2018-11-15 LAB — CBC
HCT: 31.4 % — ABNORMAL LOW (ref 39.0–52.0)
Hemoglobin: 9.7 g/dL — ABNORMAL LOW (ref 13.0–17.0)
MCH: 31.1 pg (ref 26.0–34.0)
MCHC: 30.9 g/dL (ref 30.0–36.0)
MCV: 100.6 fL — ABNORMAL HIGH (ref 80.0–100.0)
Platelets: 189 10*3/uL (ref 150–400)
RBC: 3.12 MIL/uL — ABNORMAL LOW (ref 4.22–5.81)
RDW: 18 % — AB (ref 11.5–15.5)
WBC: 6.1 10*3/uL (ref 4.0–10.5)
nRBC: 0 % (ref 0.0–0.2)

## 2018-11-15 LAB — MAGNESIUM: Magnesium: 1.8 mg/dL (ref 1.7–2.4)

## 2018-11-15 LAB — TROPONIN I: Troponin I: 0.06 ng/mL (ref <0.03)

## 2018-11-15 MED ORDER — POTASSIUM CHLORIDE CRYS ER 20 MEQ PO TBCR
20.0000 meq | EXTENDED_RELEASE_TABLET | Freq: Two times a day (BID) | ORAL | Status: DC
  Administered 2018-11-15 – 2018-11-16 (×2): 20 meq via ORAL
  Filled 2018-11-15 (×2): qty 1

## 2018-11-15 NOTE — Progress Notes (Signed)
[employer] - [place] at [facility]   PATIENT NAME: Dylan [name]    MR#:  [id number]  DATE OF BIRTH:  12/24/1935  SUBJECTIVE:  CHIEF COMPLAINT:   Chief Complaint  Patient presents with   Shortness of Breath  less SOB, tired. REVIEW OF SYSTEMS:  Review of Systems  Constitutional: Negative for diaphoresis, fever, malaise/fatigue and weight loss.  HENT: Negative for ear discharge, ear pain, hearing loss, nosebleeds, sore throat and tinnitus.   Eyes: Negative for blurred vision and pain.  Respiratory: Positive for shortness of breath. Negative for cough, hemoptysis and wheezing.   Cardiovascular: Negative for chest pain, palpitations, orthopnea and leg swelling.  Gastrointestinal: Negative for abdominal pain, blood in stool, constipation, diarrhea, heartburn, nausea and vomiting.  Genitourinary: Negative for dysuria, frequency and urgency.  Musculoskeletal: Negative for back pain and myalgias.  Skin: Negative for itching and rash.  Neurological: Negative for dizziness, tingling, tremors, focal weakness, seizures, weakness and headaches.  Psychiatric/Behavioral: Negative for depression. The patient is not nervous/anxious.    DRUG ALLERGIES:  No Known Allergies VITALS:  Blood pressure 112/85, pulse 60, temperature 97.7 F (36.5 C), temperature source Oral, resp. rate 18, height 6' (1.829 m), weight 128.6 kg, SpO2 98 %. PHYSICAL EXAMINATION:  Physical Exam Constitutional:      Appearance: He is morbidly obese. He is ill-appearing.  HENT:     Head: Normocephalic and atraumatic.  Eyes:     Conjunctiva/sclera: Conjunctivae normal.     Pupils: Pupils are equal, round, and reactive to light.  Neck:     Musculoskeletal: Normal range of motion and neck supple.     Thyroid: No thyromegaly.     Trachea: No tracheal deviation.  Cardiovascular:     Rate and Rhythm: Normal rate and regular rhythm.     Heart sounds: Normal heart sounds.  Pulmonary:     Effort:  Pulmonary effort is normal. No respiratory distress.     Breath sounds: Normal breath sounds. No wheezing.  Chest:     Chest wall: No tenderness.  Abdominal:     General: Bowel sounds are normal. There is no distension.     Palpations: Abdomen is soft.     Tenderness: There is no abdominal tenderness.  Musculoskeletal: Normal range of motion.  Skin:    General: Skin is warm and dry.     Findings: No rash.  Neurological:     Mental Status: He is alert and oriented to person, place, and time.     Cranial Nerves: No cranial nerve deficit.    LABORATORY PANEL:  Male CBC Recent Labs  Lab 11/15/18 0533  WBC 6.1  HGB 9.7*  HCT 31.4*  PLT 189   ------------------------------------------------------------------------------------------------------------------ Chemistries  Recent Labs  Lab 11/14/18 0638 11/15/18 0533  NA 139 142  K 3.2* 4.1  CL 99 102  CO2 30 32  GLUCOSE 105* 104*  BUN 30* 30*  CREATININE 0.96 1.13  CALCIUM 8.6* 8.4*  MG 1.8 1.8  AST 42*  --   ALT 46*  --   ALKPHOS 119  --   BILITOT 0.7  --    RADIOLOGY:  US Venous Img Lower Bilateral  Result Date: 11/14/2018 CLINICAL DATA:  Bilateral lower extremity edema. History of smoking. Evaluate for DVT. EXAM: BILATERAL LOWER EXTREMITY VENOUS DOPPLER ULTRASOUND TECHNIQUE: Gray-scale sonography with graded compression, as well as color Doppler and duplex ultrasound were performed to evaluate the lower extremity deep venous systems from the level of the common femoral  vein and including the common femoral, femoral, profunda femoral, popliteal and calf veins including the posterior tibial, peroneal and gastrocnemius veins when visible. The superficial great saphenous vein was also interrogated. Spectral Doppler was utilized to evaluate flow at rest and with distal augmentation maneuvers in the common femoral, femoral and popliteal veins. COMPARISON:  None. FINDINGS: RIGHT LOWER EXTREMITY Common Femoral Vein: No evidence of  thrombus. Normal compressibility, respiratory phasicity and response to augmentation. Saphenofemoral Junction: No evidence of thrombus. Normal compressibility and flow on color Doppler imaging. Profunda Femoral Vein: No evidence of thrombus. Normal compressibility and flow on color Doppler imaging. Femoral Vein: No evidence of thrombus. Normal compressibility, respiratory phasicity and response to augmentation. Popliteal Vein: No evidence of thrombus. Normal compressibility, respiratory phasicity and response to augmentation. Calf Veins: No evidence of thrombus. Normal compressibility and flow on color Doppler imaging. Superficial Great Saphenous Vein: No evidence of thrombus. Normal compressibility. Venous Reflux:  None. Other Findings:  None. LEFT LOWER EXTREMITY Common Femoral Vein: No evidence of thrombus. Normal compressibility, respiratory phasicity and response to augmentation. Saphenofemoral Junction: No evidence of thrombus. Normal compressibility and flow on color Doppler imaging. Profunda Femoral Vein: No evidence of thrombus. Normal compressibility and flow on color Doppler imaging. Femoral Vein: No evidence of thrombus. Normal compressibility, respiratory phasicity and response to augmentation. Popliteal Vein: No evidence of thrombus. Normal compressibility, respiratory phasicity and response to augmentation. Calf Veins: No evidence of thrombus. Normal compressibility and flow on color Doppler imaging. Superficial Great Saphenous Vein: No evidence of thrombus. Normal compressibility. Venous Reflux:  None. Other Findings:  None. IMPRESSION: No evidence of DVT within either lower extremity. Electronically Signed   By: Simonne Come M.D.   On: 11/14/2018 09:10   ASSESSMENT AND PLAN:   #1.  Acute on chronic systolic and diastolic CHF.  The patient's last EF was 30 to 35% with grade 1 diastolic dysfunction within less than 6 months.  Therefore I will not repeat it for now. continue IV Lasix 80 BID. neg  serial cardiac enzyme. Appreciate cardiology consult by Dr. Gwen Pounds.  continue Aldactone. Sherryll Burger started y'day per cardio  2.  Lower respiratory infection with possible mucoid right lung plug.  His groundglass densities are more consistent with edema.  - empiric IV Rocephin and p.o. Zithromax for now and will follow blood culture as well as obtain sputum Gram stain culture and sensitivity.   - continue scheduled and as needed DuoNeb as well as Mucinex. Appreciate Pulmo input  3.  Hypokalemia: repleted and resolved  4.  Hypertension continue Coreg, Imdur, aldactone.  5..  Dyslipidemia. continue his statin therapy while following LFTs.  6.  Elevated fibrin derivative d-dimer.  His chest CTA was negative for PE.  bilateral lower extremity venous duplex neg as well  7.  DVT prophylaxis.   on subcutaneous Lovenox.    All the records are reviewed and case discussed with Care Management/Social Worker. Management plans discussed with the patient, family (wife at bedside) and they are in agreement.  CODE STATUS: Full Code  TOTAL TIME TAKING CARE OF THIS PATIENT: 35 minutes.   More than 50% of the time was spent in counseling/coordination of care: YES  POSSIBLE D/C IN 2-3 DAYS, DEPENDING ON CLINICAL CONDITION.   Delfino Lovett M.D on 11/15/2018 at 8:56 AM  Between 7am to 6pm - Pager - [phone]  After 6pm go to [email or web address] - [facility]  Office  [phone]  CC: Primary care physician; Arlana Pouch,  Jillene Bucks, MD  Note: This dictation was prepared with Dragon dictation along with smaller phrase technology. Any transcriptional errors that result from this process are unintentional.

## 2018-11-15 NOTE — Progress Notes (Signed)
Pt wanted the Bipap removed. Pt stated it was uncomfortable. Bipap taken off and pt placed on 2lpm nasal cannula. No distress noted. RN notified.

## 2018-11-15 NOTE — Consult Note (Signed)
PHARMACY CONSULT NOTE - FOLLOW UP  Pharmacy Consult for Electrolyte Monitoring and Replacement   Recent Labs: Potassium (mmol/L)  Date Value  11/15/2018 4.1  12/02/2012 3.1 (L)   Magnesium (mg/dL)  Date Value  25/42/7062 1.8   Calcium (mg/dL)  Date Value  37/62/8315 8.4 (L)   Calcium, Total (mg/dL)  Date Value  17/61/6073 8.2 (L)   Albumin (g/dL)  Date Value  71/01/2693 2.6 (L)  12/02/2012 3.3 (L)   Sodium (mmol/L)  Date Value  11/15/2018 142  12/02/2012 142   Assessment: 83 YOM with an electrolyte imbalance while aggressively diuresing. He is currently receiving Lasix 80 mg IV q12h. Other medications that may affect potassium levels include spironolactone 12.5mg  daily and Entrest 24/26 mg BID. Potassium level has significantly improved since adding therapy yesterday  Plan:  ---decrease KCl to 20 mEq BID while patient is on this furosemide regimen ---electrolyte levels have been ordered and pharmacy will replace as needed  Lowella Bandy ,PharmD Clinical Pharmacist 11/15/2018 10:51 AM

## 2018-11-15 NOTE — Progress Notes (Signed)
Pulmonary Medicine          Date: 11/15/2018,   MRN# [id number] Dylan [name] May 08, 1936     AdmissionWeight: 125.6 kg                 CurrentWeight: 128.6 kg       SUBJECTIVE   Patient resting in bed comfortably reports clinical improvement overnight.  Discussed care plan with patient and wife wife Dylan [name].   Total net -1 L overnight was unable to hold external condom catheter will order Foley while aggressively diuresing.  Tolerated V60 BIPAP well.  Initiating PT tommrow.  CXR for interval changes at 7am.    PAST MEDICAL HISTORY   Past Medical History:  Diagnosis Date   CAD (coronary artery disease)    Cervicalgia    CHF (congestive heart failure) (HCC)    COPD (chronic obstructive pulmonary disease) (HCC)    Diastolic heart failure (HCC)    Foot drop, right    History of kidney stones    Hyperlipidemia    unspecified   Hypertension    Myocardial infarction [facility])    Osteoarthritis    Shoulder pain, left    Sleep apnea    Tremor, essential      SURGICAL HISTORY   Past Surgical History:  Procedure Laterality Date   APPLICATION OF WOUND VAC Right 11/13/2017   Procedure: APPLICATION OF WOUND VAC;  Surgeon: Annice Needy, MD;  Location: [facility] OR[facility];  Service: Vascular;  Laterality: Right;   BACK SURGERY  06/2010   CARDIAC CATHETERIZATION Left 04/30/2016   Procedure: Left Heart Cath and Coronary Angiography;  Surgeon: Laurier Nancy, MD;  Location: [facility];  Service: Cardiovascular;  Laterality: Left;   COLONOSCOPY     CORONARY ANGIOPLASTY     CORONARY/GRAFT ACUTE MI REVASCULARIZATION N/A 06/21/2018   Procedure: Coronary/Graft Acute MI Revascularization;  Surgeon: Iran Ouch, MD;  Location: [facility];  Service: Cardiovascular;  Laterality: N/A;   KNEE ARTHROPLASTY Left 06/01/2018   Procedure: COMPUTER ASSISTED TOTAL KNEE ARTHROPLASTY;  Surgeon: Donato Heinz, MD;  Location: [facility];  Service:  Orthopedics;  Laterality: Left;   KNEE ARTHROSCOPY Left    KNEE SURGERY Left 1998   LEFT HEART CATH AND CORONARY ANGIOGRAPHY N/A 06/21/2018   Procedure: LEFT HEART CATH AND CORONARY ANGIOGRAPHY;  Surgeon: Iran Ouch, MD;  Location: [facility];  Service: Cardiovascular;  Laterality: N/A;   ORIF FEMUR FRACTURE Left 08/27/2018   Procedure: OPEN REDUCTION INTERNAL FIXATION (ORIF) SUPRACONDYLAR FEMUR FRACTURE ABOVE PROSTHESIS, QUADRICEPS REPAIR;  Surgeon: Kennedy Bucker, MD;  Location: [facility];  Service: Orthopedics;  Laterality: Left;   TONSILLECTOMY     WOUND DEBRIDEMENT Right 11/13/2017   Procedure: DEBRIDEMENT WOUND;  Surgeon: Annice Needy, MD;  Location: [facility];  Service: Vascular;  Laterality: Right;     FAMILY HISTORY   Family History  Problem Relation Age of Onset   Diabetes Son    Cancer Mother    Colon cancer Mother    Lung cancer Mother    Tremor Father    Heart disease Father    Tremor Brother    Bladder Cancer Brother    Tremor Sister      SOCIAL HISTORY   Social History   Tobacco Use   Smoking status: Former Smoker    Packs/day: 0.25    Years: 58.00    Pack years: 14.50    Types: Cigarettes   Smokeless  tobacco: Former NeurosurgeonUser   Tobacco comment: quit the begginning of septembet 2019  Substance Use Topics   Alcohol use: No    Alcohol/week: 0.0 standard drinks   Drug use: No     MEDICATIONS    Home Medication:    Current Medication:  Current Facility-Administered Medications:    0.9 %  sodium chloride infusion, 250 mL, Intravenous, PRN, Mansy, Jan A, MD, Last Rate: 10 mL/hr at 11/14/18 0603, 250 mL at 11/14/18 0603   0.9 %  sodium chloride infusion, , Intravenous, PRN, Delfino LovettShah, Vipul, MD, Last Rate: 10 mL/hr at 11/15/18 0512, 500 mL at 11/15/18 [id number]   acetaminophen (TYLENOL) tablet 650 mg, 650 mg, Oral, Q4H PRN, Mansy, Jan A, MD, 650 mg at 11/15/18 0930   alum & mag hydroxide-simeth (MAALOX/MYLANTA) 200-200-20 MG/5ML  suspension 30 mL, 30 mL, Oral, Q4H PRN, Mansy, Jan A, MD   aspirin EC tablet 81 mg, 81 mg, Oral, Daily, Mansy, Jan A, MD, 81 mg at 11/15/18 0915   atorvastatin (LIPITOR) tablet 80 mg, 80 mg, Oral, Daily, Mansy, Jan A, MD, 80 mg at 11/14/18 1711   [COMPLETED] azithromycin (ZITHROMAX) tablet 500 mg, 500 mg, Oral, Daily, 500 mg at 11/14/18 1038 **FOLLOWED BY** azithromycin (ZITHROMAX) tablet 250 mg, 250 mg, Oral, Daily, Mansy, Jan A, MD, 250 mg at 11/15/18 [id number]   benzonatate (TESSALON) capsule 200 mg, 200 mg, Oral, TID PRN, Mansy, Jan A, MD   bisacodyl (DULCOLAX) suppository 10 mg, 10 mg, Rectal, PRN, Mansy, Jan A, MD   carvedilol (COREG) tablet 6.25 mg, 6.25 mg, Oral, BID WC, Mansy, Jan A, MD, 6.25 mg at 11/15/18 [id number]   cholecalciferol (VITAMIN D3) tablet 1,000 Units, 1,000 Units, Oral, Daily, Mansy, Jan A, MD   clopidogrel (PLAVIX) tablet 75 mg, 75 mg, Oral, Daily, Mansy, Jan A, MD, 75 mg at 11/15/18 0917   enoxaparin (LOVENOX) injection 40 mg, 40 mg, Subcutaneous, BID, Mansy, Jan A, MD, 40 mg at 11/15/18 0505   furosemide (LASIX) injection 80 mg, 80 mg, Intravenous, Q12H, Vida RiggerAleskerov, Fermon Ureta, MD, 80 mg at 11/15/18 0504   gabapentin (NEURONTIN) capsule 400 mg, 400 mg, Oral, QHS, Mansy, Jan A, MD, 400 mg at 11/14/18 2318   guaiFENesin (MUCINEX) 12 hr tablet 600 mg, 600 mg, Oral, BID, Mansy, Jan A, MD, 600 mg at 11/15/18 [id number]   ipratropium-albuterol (DUONEB) 0.5-2.5 (3) MG/3ML nebulizer solution 3 mL, 3 mL, Nebulization, Q6H PRN, Mansy, Jan A, MD   ipratropium-albuterol (DUONEB) 0.5-2.5 (3) MG/3ML nebulizer solution 3 mL, 3 mL, Nebulization, QID, Mansy, Jan A, MD, 3 mL at 11/15/18 1123   ipratropium-albuterol (DUONEB) 0.5-2.5 (3) MG/3ML nebulizer solution 3 mL, 3 mL, Nebulization, Q4H PRN, Mansy, Jan A, MD   isosorbide mononitrate (IMDUR) 24 hr tablet 30 mg, 30 mg, Oral, Daily, Mansy, Jan A, MD, 30 mg at 11/15/18 [id number]   ketotifen (ZADITOR) 0.025 % ophthalmic solution 1 drop, 1 drop, Both  Eyes, BID, Mansy, Jan A, MD, 1 drop at 11/15/18 0919   loratadine (CLARITIN) tablet 10 mg, 10 mg, Oral, Daily PRN, Mansy, Jan A, MD   magnesium hydroxide (MILK OF MAGNESIA) suspension 30 mL, 30 mL, Oral, Q4H PRN, Mansy, Jan A, MD   Melatonin TABS 5 mg, 5 mg, Oral, QHS, Mansy, Jan A, MD, 5 mg at 11/14/18 2318   multivitamin with minerals tablet 1 tablet, 1 tablet, Oral, Daily, Mansy, Jan A, MD, 1 tablet at 11/15/18 0917   nitroGLYCERIN (NITROSTAT) SL tablet 0.4 mg, 0.4 mg, Sublingual, Q5 min PRN, Mansy, Jan  A, MD   ondansetron (ZOFRAN) injection 4 mg, 4 mg, Intravenous, Q6H PRN, Mansy, Jan A, MD   polyethylene glycol (MIRALAX / GLYCOLAX) packet 17 g, 17 g, Oral, BID PRN, Mansy, Jan A, MD   potassium chloride SA (K-DUR,KLOR-CON) CR tablet 20 mEq, 20 mEq, Oral, BID, Lowella Bandy, RPH   primidone (MYSOLINE) tablet 200 mg, 200 mg, Oral, BID, Mansy, Jan A, MD, 200 mg at 11/15/18 0915   saccharomyces boulardii (FLORASTOR) capsule 250 mg, 250 mg, Oral, BID, Mansy, Jan A, MD, 250 mg at 11/15/18 1610   sacubitril-valsartan (ENTRESTO) 24-26 mg per tablet, 1 tablet, Oral, BID, Mansy, Jan A, MD, 1 tablet at 11/15/18 9604   senna-docusate (Senokot-S) tablet 2 tablet, 2 tablet, Oral, BID PRN, Mansy, Jan A, MD   sodium chloride flush (NS) 0.9 % injection 3 mL, 3 mL, Intravenous, Q12H, Mansy, Jan A, MD, 3 mL at 11/15/18 0930   sodium chloride flush (NS) 0.9 % injection 3 mL, 3 mL, Intravenous, PRN, Mansy, Jan A, MD, 3 mL at 11/15/18 0505   spironolactone (ALDACTONE) tablet 12.5 mg, 12.5 mg, Oral, Daily, Mansy, Jan A, MD, 12.5 mg at 11/15/18 5409   tamsulosin (FLOMAX) capsule 0.4 mg, 0.4 mg, Oral, Daily, Mansy, Jan A, MD, 0.4 mg at 11/15/18 8119   vitamin C (ASCORBIC ACID) tablet 500 mg, 500 mg, Oral, Daily, Mansy, Jan A, MD, 500 mg at 11/15/18 1478   zolpidem (AMBIEN) tablet 5 mg, 5 mg, Oral, QHS PRN, Mansy, Vernetta Honey, MD    ALLERGIES   Patient has no known allergies.     REVIEW OF  SYSTEMS    Review of Systems:  Gen:  Denies  fever, sweats, chills weigh loss  HEENT: Denies blurred vision, double vision, ear pain, eye pain, hearing loss, nose bleeds, sore throat Cardiac:  No dizziness, chest pain or heaviness, chest tightness,edema Resp:   Denies cough or sputum porduction, shortness of breath,wheezing, hemoptysis,  Gi: Denies swallowing difficulty, stomach pain, nausea or vomiting, diarrhea, constipation, bowel incontinence Gu:  Denies bladder incontinence, burning urine Ext:   Denies Joint pain, stiffness or swelling Skin: Denies  skin rash, easy bruising or bleeding or hives Endoc:  Denies polyuria, polydipsia , polyphagia or weight change Psych:   Denies depression, insomnia or hallucinations   Other:  All other systems negative   VS: BP 112/85 (BP Location: Left Arm)    Pulse 60    Temp 36.5 C (Oral)    Resp 18    Ht 6' (1.829 m)    Wt 128.6 kg    SpO2 96%    BMI 38.45 kg/m      PHYSICAL EXAM    GENERAL:NAD, no fevers, chills, no weakness no fatigue HEAD: Normocephalic, atraumatic.  EYES: Pupils equal, round, reactive to light. Extraocular muscles intact. No scleral icterus.  MOUTH: Moist mucosal membrane. Dentition intact. No abscess noted.  EAR, NOSE, THROAT: Clear without exudates. No external lesions.  NECK: Supple. No thyromegaly. No nodules. No JVD.  PULMONARY: Improvement in crackles bilaterally CARDIOVASCULAR: S1 and S2. Regular rate and rhythm. No murmurs, rubs, or gallops. No edema. Pedal pulses 2+ bilaterally.  GASTROINTESTINAL: Soft, nontender, nondistended. No masses. Positive bowel sounds. No hepatosplenomegaly.  MUSCULOSKELETAL: No swelling, clubbing, or edema. Range of motion full in all extremities.  NEUROLOGIC: Cranial nerves II through XII are intact. No gross focal neurological deficits. Sensation intact. Reflexes intact.  SKIN: No ulceration, lesions, rashes, or cyanosis. Skin warm and dry. Turgor intact.  PSYCHIATRIC: Mood,  affect within normal limits. The patient is awake, alert and oriented x 3. Insight, judgment intact.       IMAGING    Dg Chest 2 View  Result Date: 11/13/2018 CLINICAL DATA:  83 year old male treated for pneumonia 1 month ago. Shortness of breath, on oxygen. EXAM: CHEST - 2 VIEW COMPARISON:  10/29/2018 and earlier. FINDINGS: Chronic cardiomegaly. Persistent pleural effusion(s) appears moderate size on the lateral. Continued abrupt termination of gas in the left mainstem bronchus and dense left lung base opacity. No superimposed pneumothorax. Pulmonary vascularity appears stable without overt edema. Paucity of bowel gas in the upper abdomen. No acute osseous abnormality identified. IMPRESSION: 1. Continued Moderate pleural effusion(s). 2. Continued dense left lower lobe collapse or consolidation. This may simply be atelectasis but pneumonia is difficult to exclude. Electronically Signed   By: Odessa Fleming M.D.   On: 11/13/2018 18:58   Dg Chest 2 View  Result Date: 10/29/2018 CLINICAL DATA:  CHF pneumonia EXAM: CHEST - 2 VIEW COMPARISON:  10/27/2018 FINDINGS: AP and lateral views of the chest are limited by positioning. Low lung volumes. The cardio pericardial silhouette is enlarged. Persistent bibasilar collapse/consolidation, left greater than right with bilateral moderate pleural effusions. Interstitial pulmonary edema pattern persists. The visualized bony structures of the thorax are intact. Telemetry leads overlie the chest. IMPRESSION: No substantial change in exam. Electronically Signed   By: Kennith Center M.D.   On: 10/29/2018 15:54   Dg Chest 2 View  Result Date: 10/27/2018 CLINICAL DATA:  Shortness of breath today. EXAM: CHEST - 2 VIEW COMPARISON:  Single-view of the chest 01/10/2018, 08/30/2018 and 09/01/2018. FINDINGS: Left worse than right effusions and airspace disease are seen. Aeration in the right lung base appears mildly improved compared to the most recent examination. Cardiac  silhouette is obscured. No pneumothorax. Aortic atherosclerosis noted. IMPRESSION: Left worse than right pleural effusions and airspace disease. Aeration in the right chest is mildly improved since the most recent examination. Atherosclerosis. Electronically Signed   By: Drusilla Kanner M.D.   On: 10/27/2018 14:48   Ct Angio Chest Pe W And/or Wo Contrast  Result Date: 11/14/2018 CLINICAL DATA:  Initial evaluation for acute shortness of breath. History of CHF, COPD. EXAM: CT ANGIOGRAPHY CHEST WITH CONTRAST TECHNIQUE: Multidetector CT imaging of the chest was performed using the standard protocol during bolus administration of intravenous contrast. Multiplanar CT image reconstructions and MIPs were obtained to evaluate the vascular anatomy. CONTRAST:  ISOVUE-370 IOPAMIDOL (ISOVUE-370) INJECTION 76% COMPARISON:  Prior radiograph from earlier the same day. FINDINGS: Cardiovascular: Mild-to-moderate atherosclerotic change noted within the intrathoracic aorta. No aneurysm. Visualized great vessels grossly unremarkable. Moderate cardiomegaly with 3 vessel coronary artery calcifications. No pericardial effusion. Pulmonary arterial tree adequately opacified for evaluation. Main pulmonary artery dilated up to 3.5 cm in diameter. No filling defect to suggest acute pulmonary embolism. Re-formatted imaging confirms these findings. Mediastinum/Nodes: Visualized thyroid within normal limits. Scattered enlarged pre tracheal/precarinal nodes measure up to 13 mm in short axis. 11 mm subcarinal node noted. Mild soft tissue fullness at the bilateral hila without discrete adenopathy. No axillary adenopathy. Esophagus within normal limits. Lungs/Pleura: Proximal airway patent. Large left with moderate right layering pleural effusions. Associated volume loss/atelectasis within the bilateral lower lobes, left greater than right. Dense opacity within the posterior left upper lobe also favored to reflect atelectatic changes.  Superimpose hazy ground-glass opacity within the aerated portions of the lungs likely reflects mild edema. Superimposed infection would be difficult to exclude, although is less favored.  No pneumothorax. Few scattered superimposed tree-in-bud nodular densities noted within the subpleural right middle lobe, which could reflect mild small airways disease and/or mucoid impaction (series 6, image 33). Upper Abdomen: Visualized upper abdomen demonstrates no acute finding. Musculoskeletal: No acute osseous abnormality. No discrete lytic or blastic osseous lesions. Multilevel degenerative spondylolysis noted within the visualized spine. Gynecomastia noted. Mild diffuse anasarca within the subcutaneous soft tissues. Review of the MIP images confirms the above findings. IMPRESSION: 1. She no CT evidence for acute pulmonary embolism. 2. Large left and moderate right layering pleural effusions with associated volume loss/atelectasis within the bilateral lower lobes. Superimposed hazy ground-glass opacity within the aerated portions of the lungs likely reflects mild edema. Superimposed infection would be difficult to exclude, although is less favored. 3. Few superimposed scattered tree-in-bud nodular densities within the subpleural right middle lobe, which may reflect mild small airways disease and/or mucoid impaction. 4. Enlarged mediastinal adenopathy as above, indeterminate, but may be reactive. 5. Cardiomegaly with 3 vessel coronary artery calcifications. Electronically Signed   By: Rise Mu M.D.   On: 11/14/2018 00:27   US Venous Img Lower Bilateral  Result Date: 11/14/2018 CLINICAL DATA:  Bilateral lower extremity edema. History of smoking. Evaluate for DVT. EXAM: BILATERAL LOWER EXTREMITY VENOUS DOPPLER ULTRASOUND TECHNIQUE: Gray-scale sonography with graded compression, as well as color Doppler and duplex ultrasound were performed to evaluate the lower extremity deep venous systems from the level of the  common femoral vein and including the common femoral, femoral, profunda femoral, popliteal and calf veins including the posterior tibial, peroneal and gastrocnemius veins when visible. The superficial great saphenous vein was also interrogated. Spectral Doppler was utilized to evaluate flow at rest and with distal augmentation maneuvers in the common femoral, femoral and popliteal veins. COMPARISON:  None. FINDINGS: RIGHT LOWER EXTREMITY Common Femoral Vein: No evidence of thrombus. Normal compressibility, respiratory phasicity and response to augmentation. Saphenofemoral Junction: No evidence of thrombus. Normal compressibility and flow on color Doppler imaging. Profunda Femoral Vein: No evidence of thrombus. Normal compressibility and flow on color Doppler imaging. Femoral Vein: No evidence of thrombus. Normal compressibility, respiratory phasicity and response to augmentation. Popliteal Vein: No evidence of thrombus. Normal compressibility, respiratory phasicity and response to augmentation. Calf Veins: No evidence of thrombus. Normal compressibility and flow on color Doppler imaging. Superficial Great Saphenous Vein: No evidence of thrombus. Normal compressibility. Venous Reflux:  None. Other Findings:  None. LEFT LOWER EXTREMITY Common Femoral Vein: No evidence of thrombus. Normal compressibility, respiratory phasicity and response to augmentation. Saphenofemoral Junction: No evidence of thrombus. Normal compressibility and flow on color Doppler imaging. Profunda Femoral Vein: No evidence of thrombus. Normal compressibility and flow on color Doppler imaging. Femoral Vein: No evidence of thrombus. Normal compressibility, respiratory phasicity and response to augmentation. Popliteal Vein: No evidence of thrombus. Normal compressibility, respiratory phasicity and response to augmentation. Calf Veins: No evidence of thrombus. Normal compressibility and flow on color Doppler imaging. Superficial Great Saphenous  Vein: No evidence of thrombus. Normal compressibility. Venous Reflux:  None. Other Findings:  None. IMPRESSION: No evidence of DVT within either lower extremity. Electronically Signed   By: Simonne Come M.D.   On: 11/14/2018 09:10      ASSESSMENT/PLAN    Acute hypoxic respiratory failure  - improved - currently on 2Lnc - please wean down to goal SpO2 90-94 -due to acute onset of systolic CHF with EF of 30 to 86% - will continue will 80bid lasix for 24 h more, CXR in am ,  Will d/c IV rocephin since infectious etiology seems much less likely and minimization of fluids is imperative.  - Foley catheter to be placed today, patient is agreeable, was unable to keep exteral condom catheter held.   Decreased p.o. fluid intake to maximum 1200cc/24h.  -PharmD for electrlytes - appreciate repletion -Cardiology on case - Dr Rollen Sox - appreciate input -PT to get OOB improve strength/ROM/mobilize fluid -discussed with patient and wife regarding CKD since serial GFR and creatinine have been normal, they stated no past history of CKD was ever mentioned to them, maybe placed on record by error?   COPD    -Agree with duo nebs for now.  No need for steroids patient is not in exacerbation.  Will optimize inhalers on outpatient basis   OSA  -continue V 60 BiPAP with 1 L O2 bleed in with goal O2 sats 90 to 94% -auto titrated as per RT      Thank you for allowing me to participate in the care of this patient.  Patient/Family are satisfied with care plan and all questions have been answered.  This document was prepared using Dragon voice recognition software and may include unintentional dictation errors.     Vida Rigger, M.D.  Division of Pulmonary & Critical Care Medicine  [facility]

## 2018-11-15 NOTE — Progress Notes (Signed)
[facility] Cardiology [facility] Encounter Note  Patient: Dylan [name] / Admit Date: 11/13/2018 / Date of Encounter: 11/15/2018, 8:26 AM Patient having some improvements overall but still having some short periods of shortness of breath and anxious.  No evidence of myocardial infarction or acute coronary syndrome at this time with a troponin of 0.06.  The patient does continue to have anemia chronic kidney disease and pleural effusions likely causing some of his symptoms.  Continue diuresis with current medical regimen including furosemide although would be concerned about worsening chronic kidney disease.  It does not appear that the effusions are large enough for thoracentesis.  Manera toilet may also help   Review of Systems: Positive for: Shortness of breath Negative for: Vision change, hearing change, syncope, dizziness, nausea, vomiting,diarrhea, bloody stool, stomach pain, cough, congestion, diaphoresis, urinary frequency, urinary pain,skin lesions, skin rashes Others previously listed  Objective: Telemetry: Normal sinus rhythm Physical Exam: Blood pressure 112/85, pulse 60, temperature 97.7 F (36.5 C), temperature source Oral, resp. rate 18, height 6' (1.829 m), weight 128.6 kg, SpO2 98 %. Body mass index is 38.45 kg/m. General: Well developed, well nourished, in no acute distress. Head: Normocephalic, atraumatic, sclera non-icteric, no xanthomas, nares are without discharge. Neck: No apparent masses Lungs: Normal respirations with no wheezes, no rhonchi, no rales , no crackles   Heart: Regular rate and rhythm, normal S1 S2, no murmur, no rub, no gallop, PMI is normal size and placement, carotid upstroke normal without bruit, jugular venous pressure normal Abdomen: Soft, non-tender, non-distended with normoactive bowel sounds. No hepatosplenomegaly. Abdominal aorta is normal size without bruit Extremities: No edema, no clubbing, no cyanosis, no ulcers,  Peripheral: 2+ radial, 2+  femoral, 2+ dorsal pedal pulses Neuro: Alert and oriented. Moves all extremities spontaneously. Psych:  Responds to questions appropriately with a normal affect.   Intake/Output Summary (Last 24 hours) at 11/15/2018 0826 Last data filed at 11/15/2018 0174 Gross per 24 hour  Intake -  Output 1000 ml  Net -1000 ml    Inpatient Medications:  . aspirin EC  81 mg Oral Daily  . atorvastatin  80 mg Oral Daily  . azithromycin  250 mg Oral Daily  . carvedilol  6.25 mg Oral BID WC  . cholecalciferol  1,000 Units Oral Daily  . clopidogrel  75 mg Oral Daily  . enoxaparin (LOVENOX) injection  40 mg Subcutaneous BID  . furosemide  80 mg Intravenous Q12H  . gabapentin  400 mg Oral QHS  . guaiFENesin  600 mg Oral BID  . ipratropium-albuterol  3 mL Nebulization QID  . isosorbide mononitrate  30 mg Oral Daily  . ketotifen  1 drop Both Eyes BID  . Melatonin  5 mg Oral QHS  . multivitamin with minerals  1 tablet Oral Daily  . potassium chloride  40 mEq Oral BID  . primidone  200 mg Oral BID  . saccharomyces boulardii  250 mg Oral BID  . sacubitril-valsartan  1 tablet Oral BID  . sodium chloride flush  3 mL Intravenous Q12H  . spironolactone  12.5 mg Oral Daily  . tamsulosin  0.4 mg Oral Daily  . vitamin C  500 mg Oral Daily   Infusions:  . sodium chloride 250 mL (11/14/18 0603)  . sodium chloride 500 mL (11/15/18 0512)  . cefTRIAXone (ROCEPHIN)  IV 1 g (11/15/18 0513)    Labs: Recent Labs    11/14/18 0638 11/15/18 0533  NA 139 142  K 3.2* 4.1  CL 99  102  CO2 30 32  GLUCOSE 105* 104*  BUN 30* 30*  CREATININE 0.96 1.13  CALCIUM 8.6* 8.4*  MG 1.8 1.8   Recent Labs    11/13/18 1827 11/14/18 0638  AST 62* 42*  ALT 56* 46*  ALKPHOS 151* 119  BILITOT 0.7 0.7  PROT 5.9* 5.8*  ALBUMIN 2.7* 2.6*   Recent Labs    11/13/18 1827 11/14/18 0638 11/15/18 0533  WBC 5.8 7.0 6.1  NEUTROABS 3.9 4.3  --   HGB 10.3* 10.7* 9.7*  HCT 33.4* 34.4* 31.4*  MCV 98.5 99.1 100.6*  PLT 213  194 189   Recent Labs    11/13/18 1827 11/13/18 2118 11/14/18 0638 11/15/18 0533  TROPONINI 0.06* 0.06* 0.06* 0.06*   Invalid input(s): POCBNP No results for input(s): HGBA1C in the last 72 hours.   Weights: Filed Weights   11/13/18 1830 11/14/18 0257 11/15/18 0438  Weight: 125.6 kg 134.8 kg 128.6 kg     Radiology/Studies:  Dg Chest 2 View  Result Date: 11/13/2018 CLINICAL DATA:  83 year old male treated for pneumonia 1 month ago. Shortness of breath, on oxygen. EXAM: CHEST - 2 VIEW COMPARISON:  10/29/2018 and earlier. FINDINGS: Chronic cardiomegaly. Persistent pleural effusion(s) appears moderate size on the lateral. Continued abrupt termination of gas in the left mainstem bronchus and dense left lung base opacity. No superimposed pneumothorax. Pulmonary vascularity appears stable without overt edema. Paucity of bowel gas in the upper abdomen. No acute osseous abnormality identified. IMPRESSION: 1. Continued Moderate pleural effusion(s). 2. Continued dense left lower lobe collapse or consolidation. This may simply be atelectasis but pneumonia is difficult to exclude. Electronically Signed   By: Odessa Fleming M.D.   On: 11/13/2018 18:58   Dg Chest 2 View  Result Date: 10/29/2018 CLINICAL DATA:  CHF pneumonia EXAM: CHEST - 2 VIEW COMPARISON:  10/27/2018 FINDINGS: AP and lateral views of the chest are limited by positioning. Low lung volumes. The cardio pericardial silhouette is enlarged. Persistent bibasilar collapse/consolidation, left greater than right with bilateral moderate pleural effusions. Interstitial pulmonary edema pattern persists. The visualized bony structures of the thorax are intact. Telemetry leads overlie the chest. IMPRESSION: No substantial change in exam. Electronically Signed   By: Kennith Center M.D.   On: 10/29/2018 15:54   Dg Chest 2 View  Result Date: 10/27/2018 CLINICAL DATA:  Shortness of breath today. EXAM: CHEST - 2 VIEW COMPARISON:  Single-view of the chest  01/10/2018, 08/30/2018 and 09/01/2018. FINDINGS: Left worse than right effusions and airspace disease are seen. Aeration in the right lung base appears mildly improved compared to the most recent examination. Cardiac silhouette is obscured. No pneumothorax. Aortic atherosclerosis noted. IMPRESSION: Left worse than right pleural effusions and airspace disease. Aeration in the right chest is mildly improved since the most recent examination. Atherosclerosis. Electronically Signed   By: Drusilla Kanner M.D.   On: 10/27/2018 14:48   Ct Angio Chest Pe W And/or Wo Contrast  Result Date: 11/14/2018 CLINICAL DATA:  Initial evaluation for acute shortness of breath. History of CHF, COPD. EXAM: CT ANGIOGRAPHY CHEST WITH CONTRAST TECHNIQUE: Multidetector CT imaging of the chest was performed using the standard protocol during bolus administration of intravenous contrast. Multiplanar CT image reconstructions and MIPs were obtained to evaluate the vascular anatomy. CONTRAST:  ISOVUE-370 IOPAMIDOL (ISOVUE-370) INJECTION 76% COMPARISON:  Prior radiograph from earlier the same day. FINDINGS: Cardiovascular: Mild-to-moderate atherosclerotic change noted within the intrathoracic aorta. No aneurysm. Visualized great vessels grossly unremarkable. Moderate cardiomegaly with 3 vessel  coronary artery calcifications. No pericardial effusion. Pulmonary arterial tree adequately opacified for evaluation. Main pulmonary artery dilated up to 3.5 cm in diameter. No filling defect to suggest acute pulmonary embolism. Re-formatted imaging confirms these findings. Mediastinum/Nodes: Visualized thyroid within normal limits. Scattered enlarged pre tracheal/precarinal nodes measure up to 13 mm in short axis. 11 mm subcarinal node noted. Mild soft tissue fullness at the bilateral hila without discrete adenopathy. No axillary adenopathy. Esophagus within normal limits. Lungs/Pleura: Proximal airway patent. Large left with moderate right  layering pleural effusions. Associated volume loss/atelectasis within the bilateral lower lobes, left greater than right. Dense opacity within the posterior left upper lobe also favored to reflect atelectatic changes. Superimpose hazy ground-glass opacity within the aerated portions of the lungs likely reflects mild edema. Superimposed infection would be difficult to exclude, although is less favored. No pneumothorax. Few scattered superimposed tree-in-bud nodular densities noted within the subpleural right middle lobe, which could reflect mild small airways disease and/or mucoid impaction (series 6, image 33). Upper Abdomen: Visualized upper abdomen demonstrates no acute finding. Musculoskeletal: No acute osseous abnormality. No discrete lytic or blastic osseous lesions. Multilevel degenerative spondylolysis noted within the visualized spine. Gynecomastia noted. Mild diffuse anasarca within the subcutaneous soft tissues. Review of the MIP images confirms the above findings. IMPRESSION: 1. She no CT evidence for acute pulmonary embolism. 2. Large left and moderate right layering pleural effusions with associated volume loss/atelectasis within the bilateral lower lobes. Superimposed hazy ground-glass opacity within the aerated portions of the lungs likely reflects mild edema. Superimposed infection would be difficult to exclude, although is less favored. 3. Few superimposed scattered tree-in-bud nodular densities within the subpleural right middle lobe, which may reflect mild small airways disease and/or mucoid impaction. 4. Enlarged mediastinal adenopathy as above, indeterminate, but may be reactive. 5. Cardiomegaly with 3 vessel coronary artery calcifications. Electronically Signed   By: Rise Mu M.D.   On: 11/14/2018 00:27   US Venous Img Lower Bilateral  Result Date: 11/14/2018 CLINICAL DATA:  Bilateral lower extremity edema. History of smoking. Evaluate for DVT. EXAM: BILATERAL LOWER EXTREMITY  VENOUS DOPPLER ULTRASOUND TECHNIQUE: Gray-scale sonography with graded compression, as well as color Doppler and duplex ultrasound were performed to evaluate the lower extremity deep venous systems from the level of the common femoral vein and including the common femoral, femoral, profunda femoral, popliteal and calf veins including the posterior tibial, peroneal and gastrocnemius veins when visible. The superficial great saphenous vein was also interrogated. Spectral Doppler was utilized to evaluate flow at rest and with distal augmentation maneuvers in the common femoral, femoral and popliteal veins. COMPARISON:  None. FINDINGS: RIGHT LOWER EXTREMITY Common Femoral Vein: No evidence of thrombus. Normal compressibility, respiratory phasicity and response to augmentation. Saphenofemoral Junction: No evidence of thrombus. Normal compressibility and flow on color Doppler imaging. Profunda Femoral Vein: No evidence of thrombus. Normal compressibility and flow on color Doppler imaging. Femoral Vein: No evidence of thrombus. Normal compressibility, respiratory phasicity and response to augmentation. Popliteal Vein: No evidence of thrombus. Normal compressibility, respiratory phasicity and response to augmentation. Calf Veins: No evidence of thrombus. Normal compressibility and flow on color Doppler imaging. Superficial Great Saphenous Vein: No evidence of thrombus. Normal compressibility. Venous Reflux:  None. Other Findings:  None. LEFT LOWER EXTREMITY Common Femoral Vein: No evidence of thrombus. Normal compressibility, respiratory phasicity and response to augmentation. Saphenofemoral Junction: No evidence of thrombus. Normal compressibility and flow on color Doppler imaging. Profunda Femoral Vein: No evidence of thrombus. Normal compressibility and flow  on color Doppler imaging. Femoral Vein: No evidence of thrombus. Normal compressibility, respiratory phasicity and response to augmentation. Popliteal Vein: No  evidence of thrombus. Normal compressibility, respiratory phasicity and response to augmentation. Calf Veins: No evidence of thrombus. Normal compressibility and flow on color Doppler imaging. Superficial Great Saphenous Vein: No evidence of thrombus. Normal compressibility. Venous Reflux:  None. Other Findings:  None. IMPRESSION: No evidence of DVT within either lower extremity. Electronically Signed   By: Simonne Come M.D.   On: 11/14/2018 09:10     Assessment and Recommendation  83 y.o. male with no acute on chronic systolic dysfunction congestive heart failure coronary artery atherosclerosis chronic kidney disease anemia with edema and effusion by chest x-ray with continued shortness of breath but no evidence of acute coronary syndrome 1.  Continue intravenous furosemide for pulmonary edema pleural effusions and shortness of breath with acute on chronic systolic dysfunction heart failure 2.  Continue isosorbide for venous congestion as well as episodes of shortness of breath possible chest pain 3.  Carvedilol for heart rate control and LV systolic dysfunction 4.  No further cardiac diagnostics necessary at this time due to no evidence of myocardial infarction or acute coronary syndrome 5.  Further consideration of pulmonary toilet as well as oxygenation and CPAP machine at night for further treatment of concerns of pulmonary contribution to above symptoms 6.  Begin ambulation and further adjustments of medication management.  If improved would be okay for discharge to home from cardiac standpoint with follow-up next week  Signed, Arnoldo Hooker M.D. FACC

## 2018-11-16 ENCOUNTER — Ambulatory Visit: Payer: Medicare Other | Admitting: Family

## 2018-11-16 ENCOUNTER — Encounter: Payer: Self-pay | Admitting: Pulmonary Disease

## 2018-11-16 ENCOUNTER — Inpatient Hospital Stay: Payer: Medicare Other

## 2018-11-16 ENCOUNTER — Encounter: Payer: Self-pay | Admitting: Primary Care

## 2018-11-16 DIAGNOSIS — J9601 Acute respiratory failure with hypoxia: Secondary | ICD-10-CM

## 2018-11-16 DIAGNOSIS — Z87311 Personal history of (healed) other pathological fracture: Secondary | ICD-10-CM

## 2018-11-16 DIAGNOSIS — Z6838 Body mass index (BMI) 38.0-38.9, adult: Secondary | ICD-10-CM

## 2018-11-16 DIAGNOSIS — Z7189 Other specified counseling: Secondary | ICD-10-CM

## 2018-11-16 DIAGNOSIS — B954 Other streptococcus as the cause of diseases classified elsewhere: Secondary | ICD-10-CM

## 2018-11-16 DIAGNOSIS — I5022 Chronic systolic (congestive) heart failure: Secondary | ICD-10-CM

## 2018-11-16 DIAGNOSIS — L98499 Non-pressure chronic ulcer of skin of other sites with unspecified severity: Secondary | ICD-10-CM

## 2018-11-16 DIAGNOSIS — Z96652 Presence of left artificial knee joint: Secondary | ICD-10-CM

## 2018-11-16 DIAGNOSIS — Z8701 Personal history of pneumonia (recurrent): Secondary | ICD-10-CM

## 2018-11-16 DIAGNOSIS — I5041 Acute combined systolic (congestive) and diastolic (congestive) heart failure: Secondary | ICD-10-CM

## 2018-11-16 DIAGNOSIS — Z87440 Personal history of urinary (tract) infections: Secondary | ICD-10-CM

## 2018-11-16 DIAGNOSIS — R7881 Bacteremia: Secondary | ICD-10-CM

## 2018-11-16 DIAGNOSIS — Z9181 History of falling: Secondary | ICD-10-CM

## 2018-11-16 DIAGNOSIS — E785 Hyperlipidemia, unspecified: Secondary | ICD-10-CM

## 2018-11-16 DIAGNOSIS — F17211 Nicotine dependence, cigarettes, in remission: Secondary | ICD-10-CM

## 2018-11-16 DIAGNOSIS — I2510 Atherosclerotic heart disease of native coronary artery without angina pectoris: Secondary | ICD-10-CM

## 2018-11-16 DIAGNOSIS — I878 Other specified disorders of veins: Secondary | ICD-10-CM

## 2018-11-16 DIAGNOSIS — I252 Old myocardial infarction: Secondary | ICD-10-CM

## 2018-11-16 DIAGNOSIS — Z515 Encounter for palliative care: Secondary | ICD-10-CM

## 2018-11-16 LAB — BASIC METABOLIC PANEL
Anion gap: 8 (ref 5–15)
BUN: 29 mg/dL — ABNORMAL HIGH (ref 8–23)
CO2: 33 mmol/L — ABNORMAL HIGH (ref 22–32)
Calcium: 8.4 mg/dL — ABNORMAL LOW (ref 8.9–10.3)
Chloride: 99 mmol/L (ref 98–111)
Creatinine, Ser: 1.01 mg/dL (ref 0.61–1.24)
GFR calc Af Amer: >60 mL/min (ref >60)
GFR calc non Af Amer: >60 mL/min (ref >60)
Glucose, Bld: 92 mg/dL (ref 70–99)
Potassium: 3.9 mmol/L (ref 3.5–5.1)
Sodium: 140 mmol/L (ref 135–145)

## 2018-11-16 LAB — GLUCOSE, CAPILLARY: Glucose-Capillary: 92 mg/dL (ref 70–99)

## 2018-11-16 LAB — CBC
HEMATOCRIT: 30.7 % — AB (ref 39.0–52.0)
HEMOGLOBIN: 9.6 g/dL — AB (ref 13.0–17.0)
MCH: 31.2 pg (ref 26.0–34.0)
MCHC: 31.3 g/dL (ref 30.0–36.0)
MCV: 99.7 fL (ref 80.0–100.0)
Platelets: 198 10*3/uL (ref 150–400)
RBC: 3.08 MIL/uL — ABNORMAL LOW (ref 4.22–5.81)
RDW: 17.8 % — ABNORMAL HIGH (ref 11.5–15.5)
WBC: 6.6 10*3/uL (ref 4.0–10.5)
nRBC: 0 % (ref 0.0–0.2)

## 2018-11-16 MED ORDER — IPRATROPIUM-ALBUTEROL 0.5-2.5 (3) MG/3ML IN SOLN
3.0000 mL | Freq: Three times a day (TID) | RESPIRATORY_TRACT | Status: DC
  Administered 2018-11-16 – 2018-11-17 (×3): 3 mL via RESPIRATORY_TRACT
  Filled 2018-11-16 (×3): qty 3

## 2018-11-16 MED ORDER — DOCUSATE SODIUM 100 MG PO CAPS: 100.0000 mg | ORAL_CAPSULE | Freq: Every day | ORAL | Status: DC | PRN

## 2018-11-16 MED ORDER — FUROSEMIDE 40 MG PO TABS
40.0000 mg | ORAL_TABLET | Freq: Two times a day (BID) | ORAL | Status: DC
  Administered 2018-11-16 – 2018-11-19 (×5): 40 mg via ORAL
  Filled 2018-11-16 (×6): qty 1

## 2018-11-16 MED ORDER — ENOXAPARIN SODIUM 40 MG/0.4ML ~~LOC~~ SOLN
40.0000 mg | SUBCUTANEOUS | Status: DC
  Administered 2018-11-17 – 2018-11-19 (×3): 40 mg via SUBCUTANEOUS
  Filled 2018-11-16 (×3): qty 0.4

## 2018-11-16 MED ORDER — POTASSIUM CHLORIDE CRYS ER 20 MEQ PO TBCR: 20.0000 meq | EXTENDED_RELEASE_TABLET | Freq: Every day | ORAL | Status: DC

## 2018-11-16 MED ORDER — FUROSEMIDE 40 MG PO TABS: 80.0000 mg | ORAL_TABLET | Freq: Two times a day (BID) | ORAL | Status: DC

## 2018-11-16 MED ORDER — SODIUM CHLORIDE 0.9 % IV SOLN
2.0000 g | INTRAVENOUS | Status: DC
  Administered 2018-11-16 – 2018-11-18 (×3): 2 g via INTRAVENOUS
  Filled 2018-11-16 (×2): qty 20
  Filled 2018-11-16: qty 2
  Filled 2018-11-16: qty 20
  Filled 2018-11-16: qty 2

## 2018-11-16 MED ORDER — POLYVINYL ALCOHOL 1.4 % OP SOLN
2.0000 [drp] | OPHTHALMIC | Status: DC | PRN
  Administered 2018-11-16: 2 [drp] via OPHTHALMIC
  Filled 2018-11-16: qty 15

## 2018-11-16 NOTE — Consult Note (Signed)
PHARMACY CONSULT NOTE - FOLLOW UP  Pharmacy Consult for Electrolyte Monitoring and Replacement   Recent Labs: Potassium (mmol/L)  Date Value  11/16/2018 3.9  12/02/2012 3.1 (L)   Magnesium (mg/dL)  Date Value  06/18/5101 1.8   Calcium (mg/dL)  Date Value  58/52/7782 8.4 (L)   Calcium, Total (mg/dL)  Date Value  42/35/3614 8.2 (L)   Albumin (g/dL)  Date Value  43/15/4008 2.6 (L)  12/02/2012 3.3 (L)   Sodium (mmol/L)  Date Value  11/16/2018 140  12/02/2012 142   Assessment: 83 YOM with an electrolyte imbalance while aggressively diuresing. He is currently receiving Lasix 80 mg IV q12h - last dose given 3/16 at 0643. No active diuretic order at this time.  Other medications that may affect potassium levels include spironolactone 12.5mg  daily and Entrest 24/26 mg BID. Potassium level has significantly improved since adding therapy.  Plan:  ---K 3.9 - will decrease KCl supp to 20 mEq daily at this time and reassess tomorrow ---electrolyte levels have been ordered and pharmacy will replace as needed  Marty Heck ,PharmD Clinical Pharmacist 11/16/2018 10:18 AM

## 2018-11-16 NOTE — Consult Note (Signed)
Consultation Note Date: 11/16/2018   Patient Name: Dylan [name]  DOB: May 18, 1936  MRN: [id number]  Age / Sex: 83 y.o., male  PCP: [name], MD Referring Physician: Delfino Lovett, MD  Reason for Consultation: Establishing goals of care  HPI/Patient Profile: 83 y.o. male  with past medical history of hip fracture December 2019 that has left him bedbound, coronary artery disease, congestive heart failure, COPD, high blood pressure and cholesterol, osteoarthritis, sleep apnea, obesity admitted on 11/13/2018 with acute on chronic systolic and diastolic heart failure, lower respiratory infection, now found to have bacteremia..   Clinical Assessment and Goals of Care: Mr. Popoff is resting quietly in bed.  Unless called his name several times loudly before he wakes.  He is oriented, calm and cooperative.  There is no family at bedside at this time.  Somewhat limited conversation today due to Mr. Gilbert lethargy, no family support at this time.  We talked about his acute health concerns.  He shares that his lung doctor did tell him that he has an infection in his blood.  I shared that blood infection is very serious, but we are doing what we can to care for him.  We talked about healthcare power of attorney, see below. We talked about CODE STATUS, see below.  Mr. Ticas tells me that his wife will arrive at the hospital around 6 PM this evening, she works at [employer].  All questions answered, we plan for follow-up meeting tomorrow.  Conference with case management related to discharge plan for home with home health services.   HC POA NEXT OF KIN -Mr. Reust names his wife as his Social research officer, government.   SUMMARY OF RECOMMENDATIONS   Continue to treat the treatable Continue CODE STATUS discussions  Code Status/Advance Care Planning:  Full code -we talked about the concept of treat the treatable, and  life support.  Mr. Hellinger tells me that he wants treatment, but does not want to be kept alive on a machine.  I encouraged him to think about choices and share that we will follow-up tomorrow.  Symptom Management:   Per hospitalist, no additional needs at this time.  Palliative Prophylaxis:   Turn Reposition  Additional Recommendations (Limitations, Scope, Preferences):  Full Scope Treatment  Psycho-social/Spiritual:   Desire for further Chaplaincy support:no  Additional Recommendations: Caregiving  Support/Resources  Prognosis:   Unable to determine,  guarded based on 5 admissions in 6 months, poor functional status, now complicated by bacteremia.   6 months or less would not be surprising.  Discharge Planning: To be determined, based on outcomes.      Primary Diagnoses: Present on Admission: **None**   I have reviewed the medical record, interviewed the patient and family, and examined the patient. The following aspects are pertinent.  Past Medical History:  Diagnosis Date   CAD (coronary artery disease)    Cervicalgia    CHF (congestive heart failure) (HCC)    COPD (chronic obstructive pulmonary disease) (HCC)    Diastolic heart failure (HCC)  Foot drop, right    History of kidney stones    Hyperlipidemia    unspecified   Hypertension    Myocardial infarction (HCC)    Osteoarthritis    Shoulder pain, left    Sleep apnea    Tremor, essential    Social History   Socioeconomic History   Marital status: Married    Spouse name: Eber JonesCarolyn   Number of children: 2   Years of education: 11   Highest education level: 11th grade  Occupational History   Occupation: retired  Ecologistocial Needs   Financial resource strain: Not on Pensions consultantfile   Food insecurity:    Worry: Not on file    Inability: Not on Occupational hygienistfile   Transportation needs:    Medical: Not on file    Non-medical: Not on file  Tobacco Use   Smoking status: Former Smoker    Packs/day: 0.25      Years: 58.00    Pack years: 14.50    Types: Cigarettes   Smokeless tobacco: Former NeurosurgeonUser   Tobacco comment: quit the begginning of septembet 2019  Substance and Sexual Activity   Alcohol use: No    Alcohol/week: 0.0 standard drinks   Drug use: No   Sexual activity: Not on file  Lifestyle   Physical activity:    Days per week: Not on file    Minutes per session: Not on file   Stress: Not on file  Relationships   Social connections:    Talks on phone: Not on file    Gets together: Not on file    Attends religious service: Not on file    Active member of club or organization: Not on file    Attends meetings of clubs or organizations: Not on file    Relationship status: Not on file  Other Topics Concern   Not on file  Social History Narrative   Full Code with HCPOA and living will   Married with 2 children   Former smoker   Alcohol - none   Smokeless tobacco - none   Family History  Problem Relation Age of Onset   Diabetes Son    Cancer Mother    Colon cancer Mother    Lung cancer Mother    Tremor Father    Heart disease Father    Tremor Brother    Bladder Cancer Brother    Tremor Sister    Scheduled Meds:  aspirin EC  81 mg Oral Daily   atorvastatin  80 mg Oral Daily   carvedilol  6.25 mg Oral BID WC   cholecalciferol  1,000 Units Oral Daily   clopidogrel  75 mg Oral Daily   [START ON 11/17/2018] enoxaparin (LOVENOX) injection  40 mg Subcutaneous Q24H   furosemide  40 mg Oral BID   gabapentin  400 mg Oral QHS   guaiFENesin  600 mg Oral BID   ipratropium-albuterol  3 mL Nebulization TID   isosorbide mononitrate  30 mg Oral Daily   ketotifen  1 drop Both Eyes BID   Melatonin  5 mg Oral QHS   multivitamin with minerals  1 tablet Oral Daily   [START ON 11/17/2018] potassium chloride  20 mEq Oral Daily   primidone  200 mg Oral BID   saccharomyces boulardii  250 mg Oral BID   sacubitril-valsartan  1 tablet Oral BID   sodium  chloride flush  3 mL Intravenous Q12H   spironolactone  12.5 mg Oral Daily   tamsulosin  0.4  mg Oral Daily   vitamin C  500 mg Oral Daily   Continuous Infusions:  sodium chloride 250 mL (11/14/18 0603)   sodium chloride 30 mL (11/16/18 1232)   cefTRIAXone (ROCEPHIN)  IV 2 g (11/16/18 1233)   PRN Meds:.sodium chloride, sodium chloride, acetaminophen, alum & mag hydroxide-simeth, benzonatate, bisacodyl, docusate sodium, ipratropium-albuterol, ipratropium-albuterol, loratadine, magnesium hydroxide, nitroGLYCERIN, ondansetron (ZOFRAN) IV, polyethylene glycol, polyvinyl alcohol, senna-docusate, sodium chloride flush, zolpidem Medications Prior to Admission:  Prior to Admission medications   Medication Sig Start Date End Date Taking? Authorizing Provider  acetaminophen (TYLENOL) 500 MG tablet Take 1,000 mg by mouth 2 (two) times daily as needed for moderate pain.    [provider]  alum & mag hydroxide-simeth (MAALOX PLUS) 400-400-40 MG/5ML suspension Take 30 mLs by mouth every 4 (four) hours as needed for indigestion. 09/04/18   [provider]  Ascorbic Acid (VITAMIN C PO) Take 500 mg by mouth daily.     [provider]  aspirin EC 81 MG tablet Take 81 mg by mouth daily.    [provider]  atorvastatin (LIPITOR) 80 MG tablet Take 80 mg by mouth daily.  09/04/18   [provider]  benzonatate (TESSALON PERLES) 100 MG capsule Take 2 capsules (200 mg total) by mouth 3 (three) times daily as needed for cough. 10/31/18   [name], [name], MD  bisacodyl (DULCOLAX) 10 MG suppository Place 10 mg rectally as needed for moderate constipation.    [provider]  carvedilol (COREG) 6.25 MG tablet Take 6.25 mg by mouth 2 (two) times daily with a meal. 09/07/18   [provider]  Cholecalciferol 100 MCG (4000 UT) TABS Take 1 tablet by mouth daily. 09/07/18   [provider]  clopidogrel (PLAVIX) 75 MG tablet Take 75 mg by mouth daily.     [provider]  dextromethorphan (DELSYM) 30 MG/5ML liquid Take 5 mLs (30 mg total) by mouth 2 (two) times daily as needed for cough. 10/31/18   [name], [name], MD  dextromethorphan-guaiFENesin (MUCINEX DM) 30-600 MG 12hr tablet Take 1 tablet by mouth 2 (two) times daily as needed for cough.    [provider]  furosemide (LASIX) 20 MG tablet Take 3 tablets (60 mg total) by mouth 2 (two) times daily. 10/31/18   [name], [name], MD  gabapentin (NEURONTIN) 400 MG capsule Take 400 mg by mouth at bedtime.    [provider]  ipratropium-albuterol (DUONEB) 0.5-2.5 (3) MG/3ML SOLN Take 3 mLs by nebulization every 6 (six) hours as needed (shortness of breath).     [provider]  isosorbide mononitrate (IMDUR) 30 MG 24 hr tablet Take 30 mg by mouth daily.  05/31/16   [provider]  ketotifen (ZADITOR) 0.025 % ophthalmic solution Place 1 drop into both eyes 2 (two) times daily. 01/14/18   Alford Highland, MD  loratadine (CLARITIN) 10 MG tablet Take 10 mg by mouth daily as needed for allergies.    [provider]  magnesium hydroxide (MILK OF MAGNESIA) 400 MG/5ML suspension Take 30 mLs by mouth every 4 (four) hours as needed for mild constipation (No BM in 2 days).    [provider]  Melatonin 3 MG TABS Take 1 tablet by mouth at bedtime. 09/10/18   [provider]  Multiple Vitamins-Minerals (CENTROVITE) TABS Take 1 tablet by mouth daily.    [provider]  nitroGLYCERIN (NITROSTAT) 0.3 MG SL tablet Place 0.3 mg under the tongue every 5 (five) minutes  as needed for chest pain.    [provider]  polyethylene glycol (MIRALAX / GLYCOLAX) packet Take 17 g by mouth 2 (two) times daily as needed. Hold for loose stools. 09/29/18   [provider]  potassium chloride SA (K-DUR,KLOR-CON) 20 MEQ tablet Take 20 mEq by mouth 2 (two) times daily.     [provider]  primidone (MYSOLINE) 50 MG tablet Take 200  mg by mouth 2 (two) times daily.     [provider]  saccharomyces boulardii (FLORASTOR) 250 MG capsule Take 250 mg by mouth 2 (two) times daily. 10/26/18 12/04/18  [provider]  sacubitril-valsartan (ENTRESTO) 24-26 MG Take 1 tablet by mouth 2 (two) times daily.    [provider]  sennosides-docusate sodium (SENOKOT-S) 8.6-50 MG tablet Take 2 tablets by mouth 2 (two) times daily as needed for constipation.  09/07/18   [provider]  Skin Protectants, Misc. (ENDIT EX) Apply to bottom and moisture associated breakdown of coccyx area BID and as need 09/15/18   [provider]  spironolactone (ALDACTONE) 25 MG tablet Take 12.5 mg by mouth daily.  09/04/18   [provider]  tamsulosin (FLOMAX) 0.4 MG CAPS capsule Take 1 capsule (0.4 mg total) by mouth daily. 09/01/18   Evon Slack, PA-C  torsemide (DEMADEX) 20 MG tablet Take 40 mg by mouth daily.  09/21/18   [provider]  umeclidinium-vilanterol (ANORO ELLIPTA) 62.5-25 MCG/INH AEPB Inhale 1 puff into the lungs daily. 09/04/17   Shaune Pollack, MD   No Known Allergies Review of Systems  Unable to perform ROS: Age    Physical Exam Vitals signs and nursing note reviewed.  Constitutional:      Appearance: He is obese.     Comments: Sleepy but will make and keep eye contact, appears obese, somewhat weak  Cardiovascular:     Rate and Rhythm: Normal rate.  Pulmonary:     Effort: Pulmonary effort is normal. No accessory muscle usage or respiratory distress.  Abdominal:     Palpations: Abdomen is soft.     Comments: Obese abdomen  Skin:    General: Skin is warm and dry.  Neurological:     Mental Status: He is oriented to person, place, and time.  Psychiatric:     Comments: Sleeping but calm and cooperative, not fearful     Vital Signs: BP 118/60 (BP Location: Right Arm)    Pulse (!) 51    Temp 98.2 F (36.8 C) (Oral)    Resp 20    Ht 6' (1.829 m)    Wt 127.7 kg    SpO2 100%     BMI 38.18 kg/m  Pain Scale: 0-10 POSS *See Group Information*: 1-Acceptable,Awake and alert Pain Score: 0-No pain   SpO2: SpO2: 100 % O2 Device:SpO2: 100 % O2 Flow Rate: .O2 Flow Rate (L/min): 2 L/min  IO: Intake/output summary:   Intake/Output Summary (Last 24 hours) at 11/16/2018 1634 Last data filed at 11/16/2018 1233 Gross per 24 hour  Intake 611.21 ml  Output 2750 ml  Net -2138.79 ml    LBM: Last BM Date: 11/14/18 Baseline Weight: Weight: 125.6 kg Most recent weight: Weight: 127.7 kg     Palliative Assessment/Data:   Flowsheet Rows     Most Recent Value  Intake Tab  Referral Department  Hospitalist  Unit at Time of Referral  Cardiac/Telemetry Unit  Palliative Care Primary Diagnosis  Cardiac  Date Notified  11/15/18  Palliative Care Type  New  Palliative care  Reason for referral  Clarify Goals of Care  Date of Admission  11/13/18  Date first seen by Palliative Care  11/16/18  # of days Palliative referral response time  1 Day(s)  # of days IP prior to Palliative referral  2  Clinical Assessment  Palliative Performance Scale Score  40%  Pain Max last 24 hours  Not able to report  Pain Min Last 24 hours  Not able to report  Dyspnea Max Last 24 Hours  Not able to report  Dyspnea Min Last 24 hours  Not able to report  Psychosocial & Spiritual Assessment  Palliative Care Outcomes  Patient/Family meeting held?  Yes  Who was at the meeting?  Patient at bedside      Time In: 1540 Time Out: 1615 Time Total: 35 minutes Greater than 50%  of this time was spent counseling and coordinating care related to the above assessment and plan.  Signed by: Katheran Awe, NP   Please contact Palliative Medicine Team phone at [phone] for questions and concerns.  For individual provider: See Loretha Stapler

## 2018-11-16 NOTE — Progress Notes (Signed)
[facility] [place] [facility]   PATIENT NAME: Dylan [name]    MR#:  [id number]  DATE OF BIRTH:  11-25-1935  SUBJECTIVE:  CHIEF COMPLAINT:   Chief Complaint  Patient presents with  . Shortness of Breath   No shortness of breath. Feels "tired". No subjective fever or chills, appetite normal. ID consulted for + blood cultures. Wife called and updated.   REVIEW OF SYSTEMS:  Review of Systems  Constitutional: + for fatigue. Negative for diaphoresis, fever, malaise, and weight loss.  HENT: Negative for ear discharge, ear pain, hearing loss, nosebleeds, sore throat and tinnitus.   Eyes: Negative for blurred vision and pain.  Respiratory: Negative for shortness of breath. Negative for cough, hemoptysis and wheezing.   Cardiovascular: Negative for chest pain, palpitations, orthopnea and leg swelling.  Gastrointestinal: Positive for constipation. Negative for abdominal pain, blood in stool, diarrhea, heartburn, nausea and vomiting.  Genitourinary: Negative for dysuria, frequency and urgency.  Musculoskeletal: Negative for back pain and myalgias.  Skin: Negative for itching and rash.  Neurological: Negative for dizziness, tingling, tremors, focal weakness, seizures, weakness and headaches.  Psychiatric/Behavioral: Negative for depression. The patient is not nervous/anxious.   DRUG ALLERGIES:  No Known Allergies VITALS:  Blood pressure 112/69, pulse (!) 54, temperature 97.8 F (36.6 C), temperature source Oral, resp. rate (!) 24, height 6' (1.829 m), weight 127.7 kg, SpO2 100 %. PHYSICAL EXAMINATION:  Physical Exam Constitutional:      Appearance: He is morbidly obese. He is ill-appearing.   HENT:     Head: Normocephalic and atraumatic.  Eyes:     Conjunctiva/sclera: Conjunctivae normal.     Pupils: Pupils are equal, round, and reactive to light.  Neck:     Musculoskeletal: Normal range of motion and neck supple.     Thyroid: No thyromegaly.     Trachea: No  tracheal deviation.  Cardiovascular:     Rate and Rhythm: Normal rate and regular rhythm.     Heart sounds: Normal heart sounds.  Pulmonary:     Effort: Pulmonary effort is normal. No respiratory distress.     Breath sounds: Bibasilar crackles are present. No wheezing.  Chest:     Chest wall: No tenderness.  Abdominal:     General: Bowel sounds are normal. There is no distension.     Palpations: Abdomen is soft.     Tenderness: There is no abdominal tenderness.  Musculoskeletal: Normal range of motion.  Skin:    General: Skin is warm and dry.     Findings: No rash.  Neurological:     Mental Status: He is alert and oriented to person, place, and time.     Cranial Nerves: No cranial nerve deficit.  He has an indwelling foley catheter.   LABORATORY PANEL:  Male CBC Recent Labs  Lab 11/16/18 0313  WBC 6.6  HGB 9.6*  HCT 30.7*  PLT 198   ------------------------------------------------------------------------------------------------------------------ Chemistries  Recent Labs  Lab 11/14/18 0638 11/15/18 0533 11/16/18 0313  NA 139 142 140  K 3.2* 4.1 3.9  CL 99 102 99  CO2 30 32 33*  GLUCOSE 105* 104* 92  BUN 30* 30* 29*  CREATININE 0.96 1.13 1.01  CALCIUM 8.6* 8.4* 8.4*  MG 1.8 1.8  --   AST 42*  --   --   ALT 46*  --   --   ALKPHOS 119  --   --   BILITOT 0.7  --   --  RADIOLOGY:  Dg Chest Port 1 View  Result Date: 11/16/2018 CLINICAL DATA:  83 year old male. Pleural effusion. Subsequent encounter. EXAM: PORTABLE CHEST 1 VIEW COMPARISON:  11/13/2018 CT and chest x-ray. FINDINGS: Left greater than right pleural effusion. Pulmonary vascular congestion/pulmonary edema. Basilar consolidation may reflect atelectasis or infiltrate associated with the pleural effusions. Cardiomegaly. Calcified aorta. No obvious pneumothorax. No acute osseous abnormality noted. IMPRESSION: 1. Overall similar appearance of left greater than right pleural effusion, pulmonary edema and  basilar consolidation. 2. Cardiomegaly. 3.  Aortic Atherosclerosis (ICD10-I70.0). Electronically Signed   By: Lacy Duverney M.D.   On: 11/16/2018 08:03   ASSESSMENT AND PLAN:  Dylan [name]  is a 83 y.o. male with a known history of multiple medical problems that will be mentioned below who presented to the emergency room with dyspnea with associated panic attacks as well as orthopnea without worsening lower extremity edema.  He has been having mild chest congestion without significant cough or wheezing.  No fever or chills.  No nausea vomiting or abdominal pain.  He has been having occasional paroxysmal nocturnal dyspnea.  He denied any rhinorrhea or nasal congestion or sore throat or earache.  No dysuria, oliguria or hematuria or flank pain. His most recent 2D echo was on 06/21/2018 and it revealed an EF of 30 to 35% with first grade diastolic dysfunction, moderate right ventricular dilation and mild right atrial dilatation with mild mitral regurgitation.  The patient had a portable chest x-ray that showed cardiomegaly and continued moderate pleural effusion as well as left lower lobe dense opacity, collapse versus consolidation possibly atelectasis versus pneumonia.  A chest CTA revealed no evidence for PE.  It showed bilateral pleural effusion with atelectasis in the lower lobes with groundglass densities and edema likely secondary to edema with infectious etiology less likely.  It also showed right lower lobe density that could be small airway disease and/over mucoid impaction.  EKG was suboptimal due to motion and showed likely accelerated junctional rhythm with a rate of 65 with nonspecific intraventricular conduction delay.   Acute on chronic systolic and diastolic CHF. The patient's last EF was 30 to 35% with grade 1 diastolic dysfunction within less than 6 months. Therefore I will not repeat it for now. was on IV Lasix 80 BID. Now on Lasix 40 mg BID.  neg serial cardiac enzyme. Appreciate  cardiology consult by Dr. Gwen Pounds. continue Aldactone. - Entresto started 11/14/18 per cardiology - contribution likely secondary to bacteremia, see below   Lower respiratory infection with possible mucoid right lung plug. His groundglass densities are more consistent with edema.  - empiric IV Rocephin and p.o. Zithromax and will follow blood culture as well as obtain sputum Gram stain culture and sensitivity. Zithromax was discontinued per record. ID to see patient as below, await recommendations.   - continue scheduled and as needed DuoNeb as well as Mucinex. Appreciate Pulmo input  Bacteremia, rothia mucilanginosa, streptococcus viridans Repeat blood cultures ordered today,  ID Dr. Rivka Safer consulted  Hypokalemia: repleted and resolved. Pharmacy following, appreciate input.   Hypertension continue Coreg, Imdur, aldactone.  Dyslipidemia.continue his statin therapy while following LFTs.  Elevated fibrin derivative d-dimer. His chest CTA was negative for PE. bilateral lower extremity venous duplex neg as well  OSA BiPAP per Pulmonology Dr. Karna Christmas  COPD continue duonebs. No need for steroids. Inhaler optimization per Pulmonology  DVT prophylaxis. on subcutaneous Lovenox.  Palliative care following due to his multiple comorbidities  Constipation bowel regimen ordered, monitor daily  All the  records are reviewed and case is discussed with Care Management/Social Worker. Management plans discussed with the patient and/or family and they are in agreement.  CODE STATUS: Full Code  TOTAL TIME TAKING CARE OF THIS PATIENT: 30 minutes.   More than 50% of the time was spent in counseling/coordination of care: YES  POSSIBLE D/C IN 2-3 DAYS, DEPENDING ON CLINICAL CONDITION.   Corita Allinson PA-C on 11/16/2018 at 5:25 PM  Between 7am to 6pm - Pager - [phone]  After 6 pm go to [email or web address] - Social research officer, government  [facility] [place] [facility]  Office   [phone]  CC: Primary care physician; [name], MD  Note: This dictation was prepared with Dragon dictation along with smaller phrase technology. Any transcriptional errors that result from this process are unintentional.

## 2018-11-16 NOTE — Progress Notes (Signed)
Anticoagulation monitoring(Lovenox):  83 yo male ordered Lovenox 40 mg Q12h  Filed Weights   11/14/18 0257 11/15/18 0438 11/16/18 0438  Weight: 297 lb 1.6 oz (134.8 kg) 283 lb 8.2 oz (128.6 kg) 281 lb 8.4 oz (127.7 kg)   Body mass index is 38.18 kg/m.    Lab Results  Component Value Date   CREATININE 1.01 11/16/2018   CREATININE 1.13 11/15/2018   CREATININE 0.96 11/14/2018   Estimated Creatinine Clearance: 79.2 mL/min (by C-G formula based on SCr of 1.01 mg/dL). Hemoglobin & Hematocrit     Component Value Date/Time   HGB 9.6 (L) 11/16/2018 0313   HGB 13.0 11/30/2012 1708   HCT 30.7 (L) 11/16/2018 0313   HCT 38.0 (L) 11/30/2012 1708     Per Protocol for Patient with estCrcl > 30 ml/min and BMI < 40, will transition to Lovenox 40 mg Q24h.

## 2018-11-16 NOTE — Consult Note (Signed)
NAME: Dylan [name]  DOB: 1936-06-16  MRN: [id number]  Date/Time: 11/16/2018 3:01 PM  REQUESTING PROVIDER: Dr. Sherryll Burger Subjective:  REASON FOR CONSULT: Strep viridans bacteremia ? Dylan [name] is a 83 y.o.male  with a history of morbid obesity, chronic systolic CHF, hyperlipidemia, status post total left knee replacement, periprosthetic fracture around prosthetic knee, coronary artery disease Presents to the ED from home through EMS with shortness of breath.   Patient had left TKA September 2019 and has had multiple hospitalizations since then.  He underwent TKA for primary degenerative arthrosis of the left knee on 06/01/2018 by Dr. Ernest Pine.  He was sent to rehab following that.  He came back to the ED on 06/21/2018 to 06/24/2018 for chest pain and was diagnosed with STEMI.    He was readmitted to the hospital on 08/26/2018 with falls and complaining of left leg pain following that.  He was diagnosed with a left supracondylar femur fracture above the prosthesis and quadriceps rupture and underwent ORIF on 08/27/2018.  I had seen him during that hospitalization for a UTI and ED had Klebsiella and enterococcus in the urine was treated with Zosyn.  He had urinary retention on presentation and had a Foley catheter.  The recommendation was to do a post void bladder scan to check for any residual urine.  He was also treated for congestive heart failure during that admission.  And he was discharged to rehab on 09/04/2018.  He was readmitted to the hospital on 10/27/2018 with shortness of breath and cough and was found to have CHF and was also treated for pneumonia.  He is back again with similar complaints of shortness of breath.  In the ED his vitals were blood pressure 144/60, heart rate of 65, respiratory rate of 22, temperature of 97.9 with a pulse ox of 96% X-ray once again revealed bilateral pleural effusion and basilar atelectasis/consolidation. His blood culture from 314 is showing strep viridans and  Rothia mucilaginosa from both aerobic and anaerobic bottle of the right forearm. I am asked to see the patient for the bacteremia. pts main complaint is feeling of chocking like a panic attack and not able to breathe since his fall Dec 2019. He has not walked since then He denies any fever or chills or weight loos  Medical history Hypertension Hyperlipidemia Morbid obesity Benign essential tremor Closed trimalleolar fracture of left ankle Periprosthetic fracture of the left knee CAD Lymphedema Chronic venous insufficiency Varicose veins COPD AV Mobitz type I CHF Right foot drop Rotator cuff rupture left Cervicalgia  Past surgical history  Back surgery in 2011 Left knee TKA June 01, 2018 Open repair left quadriceps tendon rupture 01/15/1997 Cardiac catheterization  Social History   Socioeconomic History  . Marital status: Married    Spouse name: [name]  . Number of children: 2  . Years of education: 52  . Highest education level: 11th grade  Occupational History  . Occupation: retired  Engineer, production  . Financial resource strain: Not on file  . Food insecurity:    Worry: Not on file    Inability: Not on file  . Transportation needs:    Medical: Not on file    Non-medical: Not on file  Tobacco Use  . Smoking status: Former Smoker    Packs/day: 0.25    Years: 58.00    Pack years: 14.50    Types: Cigarettes  . Smokeless tobacco: Former Neurosurgeon  . Tobacco comment: quit the begginning of septembet 2019  Substance and  Sexual Activity  . Alcohol use: No    Alcohol/week: 0.0 standard drinks  . Drug use: No  . Sexual activity: Not on file  Lifestyle  . Physical activity:    Days per week: Not on file    Minutes per session: Not on file  . Stress: Not on file  Relationships  . Social connections:    Talks on phone: Not on file    Gets together: Not on file    Attends religious service: Not on file    Active member of club or organization: Not on file     Attends meetings of clubs or organizations: Not on file    Relationship status: Not on file  . Intimate partner violence:    Fear of current or ex partner: Not on file    Emotionally abused: Not on file    Physically abused: Not on file    Forced sexual activity: Not on file  Other Topics Concern  . Not on file  Social History Narrative   Full Code with HCPOA and living will   Married with 2 children   Former smoker   Alcohol - none   Smokeless tobacco - none    Family History  Problem Relation Age of Onset  . Diabetes Son   . Cancer Mother   . Colon cancer Mother   . Lung cancer Mother   . Tremor Father   . Heart disease Father   . Tremor Brother   . Bladder Cancer Brother   . Tremor Sister    No Known Allergies ID  Recent  Procedure Surgery Injections Trauma Sick contacts Travel Antibiotic use Food- raw/exotic Steroid/immune suppressants/splenectomy/Hardware Animal bites Tick exposure Water sports Fishing/hunting/animal bird exposure ? Current Facility-Administered Medications  Medication Dose Route Frequency Provider Last Rate Last Dose  . 0.9 %  sodium chloride infusion  250 mL Intravenous PRN Mansy, Jan A, MD 10 mL/hr at 11/14/18 0603 250 mL at 11/14/18 0603  . 0.9 %  sodium chloride infusion   Intravenous PRN Delfino Lovett, MD 10 mL/hr at 11/16/18 1232 30 mL at 11/16/18 1232  . acetaminophen (TYLENOL) tablet 650 mg  650 mg Oral Q4H PRN Mansy, Jan A, MD   650 mg at 11/15/18 0930  . alum & mag hydroxide-simeth (MAALOX/MYLANTA) 200-200-20 MG/5ML suspension 30 mL  30 mL Oral Q4H PRN Mansy, Jan A, MD      . aspirin EC tablet 81 mg  81 mg Oral Daily Mansy, Jan A, MD   81 mg at 11/16/18 9842  . atorvastatin (LIPITOR) tablet 80 mg  80 mg Oral Daily Mansy, Jan A, MD   80 mg at 11/15/18 1819  . azithromycin [facility]) tablet 250 mg  250 mg Oral Daily Mansy, Jan A, MD   250 mg at 11/16/18 0936  . benzonatate (TESSALON) capsule 200 mg  200 mg Oral TID PRN Mansy, Jan A,  MD      . bisacodyl (DULCOLAX) suppository 10 mg  10 mg Rectal PRN Mansy, Jan A, MD      . carvedilol (COREG) tablet 6.25 mg  6.25 mg Oral BID WC Mansy, Jan A, MD   6.25 mg at 11/16/18 1031  . cefTRIAXone (ROCEPHIN) 2 g in sodium chloride 0.9 % 100 mL IVPB  2 g Intravenous Q24H Delfino Lovett, MD 200 mL/hr at 11/16/18 1233 2 g at 11/16/18 1233  . cholecalciferol (VITAMIN D3) tablet 1,000 Units  1,000 Units Oral Daily Mansy, Vernetta Honey, MD   1,000  Units at 11/16/18 0937  . clopidogrel (PLAVIX) tablet 75 mg  75 mg Oral Daily Mansy, Jan A, MD   75 mg at 11/16/18 0936  . docusate sodium (COLACE) capsule 100 mg  100 mg Oral Daily PRN Lule, Joana, PA      . [START ON 11/17/2018] enoxaparin (LOVENOX) injection 40 mg  40 mg Subcutaneous Q24H Marty Heck, RPH      . furosemide (LASIX) tablet 40 mg  40 mg Oral BID Vida Rigger, MD      . gabapentin (NEURONTIN) capsule 400 mg  400 mg Oral QHS Mansy, Jan A, MD   400 mg at 11/15/18 2157  . guaiFENesin (MUCINEX) 12 hr tablet 600 mg  600 mg Oral BID Mansy, Jan A, MD   600 mg at 11/16/18 1610  . ipratropium-albuterol (DUONEB) 0.5-2.5 (3) MG/3ML nebulizer solution 3 mL  3 mL Nebulization Q6H PRN Mansy, Jan A, MD      . ipratropium-albuterol (DUONEB) 0.5-2.5 (3) MG/3ML nebulizer solution 3 mL  3 mL Nebulization Q4H PRN Mansy, Jan A, MD      . ipratropium-albuterol (DUONEB) 0.5-2.5 (3) MG/3ML nebulizer solution 3 mL  3 mL Nebulization TID Delfino Lovett, MD   3 mL at 11/16/18 1413  . isosorbide mononitrate (IMDUR) 24 hr tablet 30 mg  30 mg Oral Daily Mansy, Jan A, MD   30 mg at 11/16/18 0936  . ketotifen (ZADITOR) 0.025 % ophthalmic solution 1 drop  1 drop Both Eyes BID Mansy, Jan A, MD   1 drop at 11/16/18 0939  . loratadine (CLARITIN) tablet 10 mg  10 mg Oral Daily PRN Mansy, Jan A, MD      . magnesium hydroxide (MILK OF MAGNESIA) suspension 30 mL  30 mL Oral Q4H PRN Mansy, Jan A, MD      . Melatonin TABS 5 mg  5 mg Oral QHS Mansy, Jan A, MD   5 mg at 11/15/18 2157  .  multivitamin with minerals tablet 1 tablet  1 tablet Oral Daily Mansy, Jan A, MD   1 tablet at 11/16/18 0936  . nitroGLYCERIN (NITROSTAT) SL tablet 0.4 mg  0.4 mg Sublingual Q5 min PRN Mansy, Jan A, MD      . ondansetron [facility]) injection 4 mg  4 mg Intravenous Q6H PRN Mansy, Jan A, MD      . polyethylene glycol (MIRALAX / GLYCOLAX) packet 17 g  17 g Oral BID PRN Mansy, Jan A, MD      . polyvinyl alcohol (LIQUIFILM TEARS) 1.4 % ophthalmic solution 2 drop  2 drop Both Eyes PRN Delfino Lovett, MD   2 drop at 11/16/18 0936  . [START ON 11/17/2018] potassium chloride SA (K-DUR,KLOR-CON) CR tablet 20 mEq  20 mEq Oral Daily Marty Heck, RPH      . primidone (MYSOLINE) tablet 200 mg  200 mg Oral BID Mansy, Jan A, MD   200 mg at 11/16/18 9604  . saccharomyces boulardii (FLORASTOR) capsule 250 mg  250 mg Oral BID Mansy, Jan A, MD   250 mg at 11/16/18 5409  . sacubitril-valsartan (ENTRESTO) 24-26 mg per tablet  1 tablet Oral BID Mansy, Vernetta Honey, MD   1 tablet at 11/16/18 0936  . senna-docusate (Senokot-S) tablet 2 tablet  2 tablet Oral BID PRN Mansy, Jan A, MD      . sodium chloride flush (NS) 0.9 % injection 3 mL  3 mL Intravenous Q12H Mansy, Jan A, MD   3 mL at 11/16/18 0939  .  sodium chloride flush (NS) 0.9 % injection 3 mL  3 mL Intravenous PRN Mansy, Jan A, MD   3 mL at 11/15/18 0505  . spironolactone (ALDACTONE) tablet 12.5 mg  12.5 mg Oral Daily Mansy, Jan A, MD   12.5 mg at 11/16/18 1610  . tamsulosin (FLOMAX) capsule 0.4 mg  0.4 mg Oral Daily Mansy, Jan A, MD   0.4 mg at 11/16/18 0936  . vitamin C (ASCORBIC ACID) tablet 500 mg  500 mg Oral Daily Mansy, Jan A, MD   500 mg at 11/16/18 9604  . zolpidem (AMBIEN) tablet 5 mg  5 mg Oral QHS PRN Mansy, Vernetta Honey, MD         Abtx:  Anti-infectives (From admission, onward)   Start     Dose/Rate Route Frequency Ordered Stop   11/16/18 1200  cefTRIAXone (ROCEPHIN) 2 g in sodium chloride 0.9 % 100 mL IVPB     2 g 200 mL/hr over 30 Minutes Intravenous Every 24  hours 11/16/18 1135     11/15/18 1000  azithromycin (ZITHROMAX) tablet 250 mg     250 mg Oral Daily 11/14/18 0318 11/19/18 0959   11/15/18 0600  cefTRIAXone (ROCEPHIN) 1 g in sodium chloride 0.9 % 100 mL IVPB  Status:  Discontinued     1 g 200 mL/hr over 30 Minutes Intravenous Every 24 hours 11/14/18 0611 11/15/18 1213   11/14/18 2230  vancomycin (VANCOCIN) 2,500 mg in sodium chloride 0.9 % 500 mL IVPB     2,500 mg 250 mL/hr over 120 Minutes Intravenous  Once 11/14/18 2216 11/15/18 0117   11/14/18 1000  azithromycin (ZITHROMAX) tablet 500 mg     500 mg Oral Daily 11/14/18 0318 11/14/18 1038   11/14/18 0330  cefTRIAXone (ROCEPHIN) 1 g in sodium chloride 0.9 % 100 mL IVPB  Status:  Discontinued     1 g 200 mL/hr over 30 Minutes Intravenous Every 24 hours 11/14/18 0318 11/14/18 0611      REVIEW OF SYSTEMS:  Const: negative fever, negative chills, negative weight loss Eyes: negative diplopia or visual changes, negative eye pain ENT: negative coryza, negative sore throat Resp: cough, hemoptysis, severe dyspnea, feeling of chocking Cards: negative for chest pain, palpitations, lower extremity edema GU: negative for frequency, dysuria and hematuria GI: Negative for abdominal pain, diarrhea, bleeding, constipation Skin: negative for rash and pruritus Heme: negative for easy bruising and gum/nose bleeding MS: negative for myalgias, arthralgias, back pain and muscle weakness Neurolo:negative for headaches, dizziness, vertigo, memory problems  Psych: negative for feelings of anxiety, depression  Endocrine: negative for thyroid, diabetes Allergy/Immunology- negative for any medication or food allergies ? Pertinent Positives include : Objective:  VITALS:  BP 118/60 (BP Location: Right Arm)   Pulse (!) 51   Temp 98.2 F (36.8 C) (Oral)   Resp 20   Ht 6' (1.829 m)   Wt 127.7 kg   SpO2 100%   BMI 38.18 kg/m  PHYSICAL EXAM:  General: Alert, cooperative, some resp distress, appears  stated age.  Head: Normocephalic, without obvious abnormality, atraumatic. Eyes: Conjunctivae clear, anicteric sclerae. Pupils are equal ENT Nares normal. No drainage or sinus tenderness. Lips, mucosa, and tongue normal. No Thrush, dentition fair  Neck: Supple, symmetrical, no adenopathy, thyroid: non tender no carotid bruit and no JVD. Back: did not examine Lungs: b/l air entry- crepts bases, decreased air entry bases Heart: s1s2 Abdomen: Soft,  distended. Bowel sounds normal. No masses Extremities: edema legs- venous stasis changes, left knee scar Rt  toe superficial ulceration on the dorsal aspect Skin:dry Lymph: Cervical, supraclavicular normal. Neurologic: Grossly non-focal Pertinent Labs Lab Results CBC    Component Value Date/Time   WBC 6.6 11/16/2018 0313   RBC 3.08 (L) 11/16/2018 0313   HGB 9.6 (L) 11/16/2018 0313   HGB 13.0 11/30/2012 1708   HCT 30.7 (L) 11/16/2018 0313   HCT 38.0 (L) 11/30/2012 1708   PLT 198 11/16/2018 0313   PLT 136 (L) 11/30/2012 1708   MCV 99.7 11/16/2018 0313   MCV 92 11/30/2012 1708   MCH 31.2 11/16/2018 0313   MCHC 31.3 11/16/2018 0313   RDW 17.8 (H) 11/16/2018 0313   RDW 14.6 (H) 11/30/2012 1708   LYMPHSABS 1.5 11/14/2018 0638   LYMPHSABS 2.4 11/30/2012 1708   MONOABS 0.8 11/14/2018 0638   MONOABS 0.8 11/30/2012 1708   EOSABS 0.3 11/14/2018 0638   EOSABS 0.2 11/30/2012 1708   BASOSABS 0.0 11/14/2018 0638   BASOSABS 0.1 11/30/2012 1708    CMP Latest Ref Rng & Units 11/16/2018 11/15/2018 11/14/2018  Glucose 70 - 99 mg/dL 92 161(W) 960(A)  BUN 8 - 23 mg/dL 54(U) 98(J) 19(J)  Creatinine 0.61 - 1.24 mg/dL 4.78 2.95 6.21  Sodium 135 - 145 mmol/L 140 142 139  Potassium 3.5 - 5.1 mmol/L 3.9 4.1 3.2(L)  Chloride 98 - 111 mmol/L 99 102 99  CO2 22 - 32 mmol/L 33(H) 32 30  Calcium 8.9 - 10.3 mg/dL 3.0(Q) 6.5(H) 8.4(O)  Total Protein 6.5 - 8.1 g/dL - - 5.8(L)  Total Bilirubin 0.3 - 1.2 mg/dL - - 0.7  Alkaline Phos 38 - 126 U/L - - 119  AST  15 - 41 U/L - - 42(H)  ALT 0 - 44 U/L - - 46(H)      Microbiology: Recent Results (from the past 240 hour(s))  Blood culture (routine x 2)     Status: Abnormal (Preliminary result)   Collection Time: 11/14/18  4:10 AM  Result Value Ref Range Status   Specimen Description   Final    BLOOD RIGHT FOREARM Performed at [facility], [address], [place], [place] [address]    Special Requests   Final    BOTTLES DRAWN AEROBIC AND ANAEROBIC Blood Culture adequate volume Performed at [facility], [address] [place], [place] [address]    Culture  Setup Time   Final    GRAM POSITIVE COCCI IN BOTH AEROBIC AND ANAEROBIC BOTTLES CRITICAL RESULT CALLED TO, READ BACK BY AND VERIFIED WITH: LISA KLUTTZ @ 2106 11/14/2018 TTG Performed at [facility], [address] [place], [place] [address]    Culture (A)  Final    ROTHIA MUCILAGINOSA Standardized susceptibility testing for this organism is not available. VIRIDANS STREPTOCOCCUS    Report Status PENDING  Incomplete  Blood culture (routine x 2)     Status: None (Preliminary result)   Collection Time: 11/14/18  4:10 AM  Result Value Ref Range Status   Specimen Description BLOOD LEFT ANTECUBITAL  Final   Special Requests   Final    BOTTLES DRAWN AEROBIC AND ANAEROBIC Blood Culture adequate volume   Culture   Final    NO GROWTH 2 DAYS Performed at [facility], [address] [place], [place] [address]    Report Status PENDING  Incomplete  Blood Culture ID Panel (Reflexed)     Status: Abnormal   Collection Time: 11/14/18  4:10 AM  Result Value Ref Range Status   Enterococcus species NOT DETECTED NOT  DETECTED Final   Listeria monocytogenes NOT DETECTED NOT DETECTED Final   Staphylococcus species DETECTED (A) NOT DETECTED Final    Comment: Methicillin (oxacillin) resistant coagulase negative staphylococcus. Possible blood culture contaminant (unless isolated from more than one blood culture draw  or clinical case suggests pathogenicity). No antibiotic treatment is indicated for blood  culture contaminants. CRITICAL RESULT CALLED TO, READ BACK BY AND VERIFIED WITH: LISA KLUTTZ  11/14/2018 BY [email or web address]    Staphylococcus aureus (BCID) NOT DETECTED NOT DETECTED Final   Methicillin resistance DETECTED (A) NOT DETECTED Final    Comment: CRITICAL RESULT CALLED TO, READ BACK BY AND VERIFIED WITH: LISA KLUTTZ  11/14/2018 TTG    Streptococcus species DETECTED (A) NOT DETECTED Final    Comment: Not Enterococcus species, Streptococcus agalactiae, Streptococcus pyogenes, or Streptococcus pneumoniae. CRITICAL RESULT CALLED TO, READ BACK BY AND VERIFIED WITH: LISA KLUTTZ  11/14/2018 BY [email or web address]    Streptococcus agalactiae NOT DETECTED NOT DETECTED Final   Streptococcus pneumoniae NOT DETECTED NOT DETECTED Final   Streptococcus pyogenes NOT DETECTED NOT DETECTED Final   Acinetobacter baumannii NOT DETECTED NOT DETECTED Final   Enterobacteriaceae species NOT DETECTED NOT DETECTED Final   Enterobacter cloacae complex NOT DETECTED NOT DETECTED Final   Escherichia coli NOT DETECTED NOT DETECTED Final   Klebsiella oxytoca NOT DETECTED NOT DETECTED Final   Klebsiella pneumoniae NOT DETECTED NOT DETECTED Final   Proteus species NOT DETECTED NOT DETECTED Final   Serratia marcescens NOT DETECTED NOT DETECTED Final   Haemophilus influenzae NOT DETECTED NOT DETECTED Final   Neisseria meningitidis NOT DETECTED NOT DETECTED Final   Pseudomonas aeruginosa NOT DETECTED NOT DETECTED Final   Candida albicans NOT DETECTED NOT DETECTED Final   Candida glabrata NOT DETECTED NOT DETECTED Final   Candida krusei NOT DETECTED NOT DETECTED Final   Candida parapsilosis NOT DETECTED NOT DETECTED Final   Candida tropicalis NOT DETECTED NOT DETECTED Final    Comment: Performed at [facility], [address] [place], [place] [address]    IMAGING RESULTS:    Overall similar appearance of left  greater than right pleural effusion, pulmonary edema and basilar consolidation. 2. Cardiomegaly. I have personally reviewed the films ? Impression/Recommendation ?83 y.o.male  with a history of morbid obesity, chronic systolic CHF, hyperlipidemia, status post total left knee replacement, periprosthetic fracture around prosthetic knee, coronary artery disease Presents to the ED from home through EMS with shortness of breath.    ?Acute hypoxic resp failure- due to CHF. EF 30-35 % in Oct 2019 B/l pleural effusion Basilar atelectasis Strep viridans and rothia in 1 set of blood cultures-cannot be deemed a contaminant , especially with increasing sob/CHF for the past 3 months  need to r/o Infective endocarditis. Will order 2 d echo today- if neg will need TEE ? _H/o elft TKA sept 2019, followed by fall in DEc 2019 and fracture of periprosthetic femur and ORIF- wound healed _______________________________________________ Discussed with patient

## 2018-11-16 NOTE — TOC Initial Note (Signed)
Transition of Care [facility]) - Initial/Assessment Note    Patient Details  Name: Dylan [name] MRN: [id number] Date of Birth: [birth year]  Transition of Care [facility]) CM/SW Contact:    Sherren Kerns, RN Phone Number: 11/16/2018, 3:20 PM  Clinical Narrative:      Patient is from home with wife.  Admitted with acute on chronic CHF.  Currently on 2l oxygen; chronic 1L.   Patient is bed bound; unable to walk or bare weight since fracture of femur back in December.  Has a HF clinic appointment.  Spoke with Diane, cardiopulmonary navigator and asked about paramedicine option as he would be a good candidate.   He needs to establish with the HF clinic.  He can take EMS to appointments and has in the past.  Has a bipap at home but doesn't like to use it.      Open to [facility] for PT, RN, aide, ReDS Vest.  Will resume at DC.  Obtains medications without difficulty.  Current with PCP.  Will continue to assist with discharge disposition.         Expected Discharge Plan: Home w Home Health Services Barriers to Discharge: Continued Medical Work up   Patient Goals and CMS Choice Patient states their goals for this hospitalization and ongoing recovery are:: Discharge to home with [facility] health CMS Medicare.gov Compare Post Acute Care list provided to:: Patient Choice offered to / list presented to : Patient  Expected Discharge Plan and Services Expected Discharge Plan: Home w Home Health Services Discharge Planning Services: CM Consult Post Acute Care Choice: Home Health, Resumption of Svcs/PTA Provider Living arrangements for the past 2 months: Single Family Home                     HH Arranged: RN, PT, Nurse's Aide, OT(ReDS Vest) HH Agency: [facility] (now [facility])  Prior Living Arrangements/Services Living arrangements for the past 2 months: Single Family Home Lives with:: Spouse Patient language and need for interpreter reviewed:: Yes Do you feel safe going back to the  place where you live?: Yes        Care giver support system in place?: Yes (comment)(Spouse) Current home services: Home PT, Home RN Criminal Activity/Legal Involvement Pertinent to Current Situation/Hospitalization: No - Comment as needed  Activities of Daily Living Home Assistive Devices/Equipment: Hospital bed, Wheelchair, Nurse, adult, Oxygen, Environmental consultant (specify type), [employer] (specify quad or straight) ADL Screening (condition at time of admission) Patient's cognitive ability adequate to safely complete daily activities?: Yes Is the patient deaf or have difficulty hearing?: No Does the patient have difficulty seeing, even when wearing glasses/contacts?: No Does the patient have difficulty concentrating, remembering, or making decisions?: No Patient able to express need for assistance with ADLs?: Yes Does the patient have difficulty dressing or bathing?: Yes Independently performs ADLs?: No Communication: Independent Dressing (OT): Needs assistance Is this a change from baseline?: Pre-admission baseline Grooming: Needs assistance Is this a change from baseline?: Pre-admission baseline Feeding: Independent Bathing: Needs assistance Is this a change from baseline?: Pre-admission baseline Toileting: Needs assistance Is this a change from baseline?: Pre-admission baseline In/Out Bed: Needs assistance Is this a change from baseline?: Pre-admission baseline Walks in Home: Needs assistance Is this a change from baseline?: Pre-admission baseline Does the patient have difficulty walking or climbing stairs?: Yes Weakness of Legs: Both Weakness of Arms/Hands: Both  Permission Sought/Granted Permission sought to share information with : Oceanographer granted  to share information with : Yes, Verbal Permission Granted  Share Information with NAME: [name] with [facility]           Emotional Assessment Appearance:: Appears stated age Attitude/Demeanor/Rapport:  Gracious Affect (typically observed): Accepting Orientation: : Oriented to Self, Oriented to Place, Oriented to  Time, Oriented to Situation Alcohol / Substance Use: Not Applicable    Admission diagnosis:  SOB (shortness of breath) [R06.02] Pleural effusion [J90] Dyspnea, unspecified type [R06.00] Patient Active Problem List   Diagnosis Date Noted  . Acute CHF (congestive heart failure) (HCC) 11/14/2018  . Acute on chronic systolic CHF (congestive heart failure) (HCC) 10/27/2018  . HCAP (healthcare-associated pneumonia) 10/27/2018  . Hypokalemia 10/01/2018  . Hypertensive heart and kidney disease with acute on chronic diastolic congestive heart failure and stage 3 chronic kidney disease (HCC) 09/09/2018  . Chronic constipation 09/09/2018  . BPH with obstruction/lower urinary tract symptoms 09/09/2018  . Obstructive sleep apnea of adult 09/09/2018  . Idiopathic peripheral neuropathy 09/09/2018  . Chronic kidney disease, stage 3, mod decreased GFR (HCC) 09/09/2018  . Anemia of chronic renal failure, stage 3 (moderate) (HCC) 09/09/2018  . Periprosthetic fracture around internal prosthetic left knee joint 08/26/2018  . Acute ST elevation myocardial infarction (STEMI) of inferior wall (HCC) 06/21/2018  . S/P total knee arthroplasty 06/01/2018  . Primary osteoarthritis of left knee 02/05/2018  . Sepsis (HCC) 01/10/2018  . Community acquired pneumonia 12/01/2017  . Pneumonia 12/01/2017  . Hyperlipidemia 09/23/2017  . Weakness 08/31/2017  . Benign essential tremor 10/18/2016  . Class 3 severe obesity due to excess calories with serious comorbidity and body mass index (BMI) of 45.0 to 49.9 in adult (HCC) 10/18/2016  . Varicose veins of lower extremities with ulcer (HCC) 06/17/2016  . Chronic venous insufficiency 06/17/2016  . Lymphedema 06/17/2016  . Acute on chronic diastolic heart failure (HCC) 06/10/2016  . COPD, severe (HCC) 05/24/2016  . AV block, Mobitz 1 02/09/2016  . Coronary  atherosclerosis of native coronary artery 02/09/2016  . ST elevation MI (STEMI) (HCC) 02/08/2016  . Right foot drop 04/07/2014  . Rotator cuff rupture, complete 04/22/2013  . Neck pain 04/14/2013   PCP:  [name], MD Pharmacy:   [facility] - [place], [place] - [address] [place] [address] Phone: [phone] Fax: [phone]     Social Determinants of Health (SDOH) Interventions    Readmission Risk Interventions 30 Day Unplanned Readmission Risk Score     ED to Hosp-Admission (Current) from 11/13/2018 in [facility] (2A)  30 Day Unplanned Readmission Risk Score (%)  49 Filed at 11/16/2018 1200     This score is the patient's risk of an unplanned readmission within 30 days of being discharged (0 -100%). The score is based on dignosis, age, lab data, medications, orders, and past utilization.   Low:  0-14.9   Medium: 15-21.9   High: 22-29.9   Extreme: 30 and above       No flowsheet data found.

## 2018-11-16 NOTE — Progress Notes (Signed)
Pulmonary Medicine          Date: 11/16/2018,   MRN# [id number] Dylan [name] [birth year]     AdmissionWeight: 125.6 kg                 CurrentWeight: 127.7 kg          Reports clinical improvement. Sat up from bed with PT  Noted +blood culture - gram +cocci - ROTHIA MUCILAGINOSA    PAST MEDICAL HISTORY   Past Medical History:  Diagnosis Date   CAD (coronary artery disease)    Cervicalgia    CHF (congestive heart failure) (HCC)    COPD (chronic obstructive pulmonary disease) (HCC)    Diastolic heart failure (HCC)    Foot drop, right    History of kidney stones    Hyperlipidemia    unspecified   Hypertension    Myocardial infarction (HCC)    Osteoarthritis    Shoulder pain, left    Sleep apnea    Tremor, essential      SURGICAL HISTORY   Past Surgical History:  Procedure Laterality Date   APPLICATION OF WOUND VAC Right 11/13/2017   Procedure: APPLICATION OF WOUND VAC;  Surgeon: Annice Needy, MD;  Location: [facility];  Service: Vascular;  Laterality: Right;   BACK SURGERY  06/2010   CARDIAC CATHETERIZATION Left 04/30/2016   Procedure: Left Heart Cath and Coronary Angiography;  Surgeon: Laurier Nancy, MD;  Location: [facility];  Service: Cardiovascular;  Laterality: Left;   COLONOSCOPY     CORONARY ANGIOPLASTY     CORONARY/GRAFT ACUTE MI REVASCULARIZATION N/A 06/21/2018   Procedure: Coronary/Graft Acute MI Revascularization;  Surgeon: Iran Ouch, MD;  Location: [facility];  Service: Cardiovascular;  Laterality: N/A;   KNEE ARTHROPLASTY Left 06/01/2018   Procedure: COMPUTER ASSISTED TOTAL KNEE ARTHROPLASTY;  Surgeon: Donato Heinz, MD;  Location: [facility];  Service: Orthopedics;  Laterality: Left;   KNEE ARTHROSCOPY Left    KNEE SURGERY Left 1998   LEFT HEART CATH AND CORONARY ANGIOGRAPHY N/A 06/21/2018   Procedure: LEFT HEART CATH AND CORONARY ANGIOGRAPHY;  Surgeon: Iran Ouch, MD;  Location:  [facility];  Service: Cardiovascular;  Laterality: N/A;   ORIF FEMUR FRACTURE Left 08/27/2018   Procedure: OPEN REDUCTION INTERNAL FIXATION (ORIF) SUPRACONDYLAR FEMUR FRACTURE ABOVE PROSTHESIS, QUADRICEPS REPAIR;  Surgeon: Kennedy Bucker, MD;  Location: [facility];  Service: Orthopedics;  Laterality: Left;   TONSILLECTOMY     WOUND DEBRIDEMENT Right 11/13/2017   Procedure: DEBRIDEMENT WOUND;  Surgeon: Annice Needy, MD;  Location: [facility];  Service: Vascular;  Laterality: Right;     FAMILY HISTORY   Family History  Problem Relation Age of Onset   Diabetes Son    Cancer Mother    Colon cancer Mother    Lung cancer Mother    Tremor Father    Heart disease Father    Tremor Brother    Bladder Cancer Brother    Tremor Sister      SOCIAL HISTORY   Social History   Tobacco Use   Smoking status: Former Smoker    Packs/day: 0.25    Years: 58.00    Pack years: 14.50    Types: Cigarettes   Smokeless tobacco: Former Neurosurgeon   Tobacco comment: quit the begginning of septembet 2019  Substance Use Topics   Alcohol use: No    Alcohol/week: 0.0 standard drinks   Drug use: No  MEDICATIONS    Home Medication: as per mar   Current Medication:  Current Facility-Administered Medications:    0.9 %  sodium chloride infusion, 250 mL, Intravenous, PRN, Mansy, Jan A, MD, Last Rate: 10 mL/hr at 11/14/18 0603, 250 mL at 11/14/18 0603   0.9 %  sodium chloride infusion, , Intravenous, PRN, Delfino Lovett, MD, Last Rate: 10 mL/hr at 11/16/18 1232, 30 mL at 11/16/18 1232   acetaminophen (TYLENOL) tablet 650 mg, 650 mg, Oral, Q4H PRN, Mansy, Jan A, MD, 650 mg at 11/15/18 0930   alum & mag hydroxide-simeth (MAALOX/MYLANTA) 200-200-20 MG/5ML suspension 30 mL, 30 mL, Oral, Q4H PRN, Mansy, Jan A, MD   aspirin EC tablet 81 mg, 81 mg, Oral, Daily, Mansy, Jan A, MD, 81 mg at 11/16/18 5784   atorvastatin (LIPITOR) tablet 80 mg, 80 mg, Oral, Daily, Mansy, Jan A, MD, 80 mg at  11/15/18 1819   [COMPLETED] azithromycin (ZITHROMAX) tablet 500 mg, 500 mg, Oral, Daily, 500 mg at 11/14/18 1038 **FOLLOWED BY** azithromycin (ZITHROMAX) tablet 250 mg, 250 mg, Oral, Daily, Mansy, Jan A, MD, 250 mg at 11/16/18 6962   benzonatate (TESSALON) capsule 200 mg, 200 mg, Oral, TID PRN, Mansy, Jan A, MD   bisacodyl (DULCOLAX) suppository 10 mg, 10 mg, Rectal, PRN, Mansy, Jan A, MD   carvedilol (COREG) tablet 6.25 mg, 6.25 mg, Oral, BID WC, Mansy, Jan A, MD, 6.25 mg at 11/16/18 9528   cefTRIAXone (ROCEPHIN) 2 g in sodium chloride 0.9 % 100 mL IVPB, 2 g, Intravenous, Q24H, Sherryll Burger, Vipul, MD, Last Rate: 200 mL/hr at 11/16/18 1233, 2 g at 11/16/18 1233   cholecalciferol (VITAMIN D3) tablet 1,000 Units, 1,000 Units, Oral, Daily, Mansy, Jan A, MD, 1,000 Units at 11/16/18 4132   clopidogrel (PLAVIX) tablet 75 mg, 75 mg, Oral, Daily, Mansy, Jan A, MD, 75 mg at 11/16/18 0936   [START ON 11/17/2018] enoxaparin (LOVENOX) injection 40 mg, 40 mg, Subcutaneous, Q24H, Marty Heck, RPH   furosemide (LASIX) tablet 80 mg, 80 mg, Oral, BID, Sherryll Burger, Vipul, MD   gabapentin (NEURONTIN) capsule 400 mg, 400 mg, Oral, QHS, Mansy, Jan A, MD, 400 mg at 11/15/18 2157   guaiFENesin (MUCINEX) 12 hr tablet 600 mg, 600 mg, Oral, BID, Mansy, Jan A, MD, 600 mg at 11/16/18 4401   ipratropium-albuterol (DUONEB) 0.5-2.5 (3) MG/3ML nebulizer solution 3 mL, 3 mL, Nebulization, Q6H PRN, Mansy, Jan A, MD   ipratropium-albuterol (DUONEB) 0.5-2.5 (3) MG/3ML nebulizer solution 3 mL, 3 mL, Nebulization, Q4H PRN, Mansy, Jan A, MD   ipratropium-albuterol (DUONEB) 0.5-2.5 (3) MG/3ML nebulizer solution 3 mL, 3 mL, Nebulization, TID, Sherryll Burger, Vipul, MD   isosorbide mononitrate (IMDUR) 24 hr tablet 30 mg, 30 mg, Oral, Daily, Mansy, Jan A, MD, 30 mg at 11/16/18 0272   ketotifen (ZADITOR) 0.025 % ophthalmic solution 1 drop, 1 drop, Both Eyes, BID, Mansy, Jan A, MD, 1 drop at 11/16/18 5366   loratadine (CLARITIN) tablet 10 mg, 10  mg, Oral, Daily PRN, Mansy, Jan A, MD   magnesium hydroxide (MILK OF MAGNESIA) suspension 30 mL, 30 mL, Oral, Q4H PRN, Mansy, Jan A, MD   Melatonin TABS 5 mg, 5 mg, Oral, QHS, Mansy, Jan A, MD, 5 mg at 11/15/18 2157   multivitamin with minerals tablet 1 tablet, 1 tablet, Oral, Daily, Mansy, Jan A, MD, 1 tablet at 11/16/18 0936   nitroGLYCERIN (NITROSTAT) SL tablet 0.4 mg, 0.4 mg, Sublingual, Q5 min PRN, Mansy, Jan A, MD   ondansetron (ZOFRAN) injection 4 mg, 4 mg,  Intravenous, Q6H PRN, Mansy, Jan A, MD   polyethylene glycol (MIRALAX / GLYCOLAX) packet 17 g, 17 g, Oral, BID PRN, Mansy, Jan A, MD   polyvinyl alcohol (LIQUIFILM TEARS) 1.4 % ophthalmic solution 2 drop, 2 drop, Both Eyes, PRN, Delfino Lovett, MD, 2 drop at 11/16/18 0936   [START ON 11/17/2018] potassium chloride SA (K-DUR,KLOR-CON) CR tablet 20 mEq, 20 mEq, Oral, Daily, Marty Heck, RPH   primidone (MYSOLINE) tablet 200 mg, 200 mg, Oral, BID, Mansy, Jan A, MD, 200 mg at 11/16/18 3662   saccharomyces boulardii (FLORASTOR) capsule 250 mg, 250 mg, Oral, BID, Mansy, Jan A, MD, 250 mg at 11/16/18 9476   sacubitril-valsartan (ENTRESTO) 24-26 mg per tablet, 1 tablet, Oral, BID, Mansy, Jan A, MD, 1 tablet at 11/16/18 5465   senna-docusate (Senokot-S) tablet 2 tablet, 2 tablet, Oral, BID PRN, Mansy, Jan A, MD   sodium chloride flush (NS) 0.9 % injection 3 mL, 3 mL, Intravenous, Q12H, Mansy, Jan A, MD, 3 mL at 11/16/18 0354   sodium chloride flush (NS) 0.9 % injection 3 mL, 3 mL, Intravenous, PRN, Mansy, Jan A, MD, 3 mL at 11/15/18 0505   spironolactone (ALDACTONE) tablet 12.5 mg, 12.5 mg, Oral, Daily, Mansy, Jan A, MD, 12.5 mg at 11/16/18 6568   tamsulosin (FLOMAX) capsule 0.4 mg, 0.4 mg, Oral, Daily, Mansy, Jan A, MD, 0.4 mg at 11/16/18 1275   vitamin C (ASCORBIC ACID) tablet 500 mg, 500 mg, Oral, Daily, Mansy, Jan A, MD, 500 mg at 11/16/18 1700   zolpidem (AMBIEN) tablet 5 mg, 5 mg, Oral, QHS PRN, Mansy, Vernetta Honey,  MD    ALLERGIES   Patient has no known allergies.     REVIEW OF SYSTEMS    Review of Systems:  Gen:  Denies  fever, sweats, chills weigh loss  HEENT: Denies blurred vision, double vision, ear pain, eye pain, hearing loss, nose bleeds, sore throat Cardiac:  No dizziness, chest pain or heaviness, chest tightness,edema Resp:   Denies cough or sputum porduction, shortness of breath,wheezing, hemoptysis,  Gi: Denies swallowing difficulty, stomach pain, nausea or vomiting, diarrhea, constipation, bowel incontinence Gu:  Denies bladder incontinence, burning urine Ext:   Denies Joint pain, stiffness or swelling Skin: Denies  skin rash, easy bruising or bleeding or hives Endoc:  Denies polyuria, polydipsia , polyphagia or weight change Psych:   Denies depression, insomnia or hallucinations   Other:  All other systems negative   VS: BP 118/60 (BP Location: Right Arm)    Pulse (!) 51    Temp 36.8 C (Oral)    Resp 20    Ht 6' (1.829 m)    Wt 127.7 kg    SpO2 100%    BMI 38.18 kg/m      PHYSICAL EXAM    GENERAL:NAD, no fevers, chills, no weakness no fatigue HEAD: Normocephalic, atraumatic.  EYES: Pupils equal, round, reactive to light. Extraocular muscles intact. No scleral icterus.  MOUTH: Moist mucosal membrane. Dentition intact. No abscess noted.  EAR, NOSE, THROAT: Clear without exudates. No external lesions.  NECK: Supple. No thyromegaly. No nodules. No JVD.  PULMONARY: Decreased breath sounds bilaterally, mild bibasilar crackles.  CARDIOVASCULAR: S1 and S2. Regular rate and rhythm. No murmurs, rubs, or gallops. No edema. Pedal pulses 2+ bilaterally.  GASTROINTESTINAL: Soft, nontender, nondistended. No masses. Positive bowel sounds. No hepatosplenomegaly.  MUSCULOSKELETAL: No swelling, clubbing, or edema. Range of motion full in all extremities.  NEUROLOGIC: Cranial nerves II through XII are intact. No gross focal neurological  deficits. Sensation intact. Reflexes intact.   SKIN: No ulceration, lesions, rashes, or cyanosis. Skin warm and dry. Turgor intact.  PSYCHIATRIC: Mood, affect within normal limits. The patient is awake, alert and oriented x 3. Insight, judgment intact.       IMAGING    Dg Chest 2 View  Result Date: 11/13/2018 CLINICAL DATA:  82 year old male treated for pneumonia 1 month ago. Shortness of breath, on oxygen. EXAM: CHEST - 2 VIEW COMPARISON:  10/29/2018 and earlier. FINDINGS: Chronic cardiomegaly. Persistent pleural effusion(s) appears moderate size on the lateral. Continued abrupt termination of gas in the left mainstem bronchus and dense left lung base opacity. No superimposed pneumothorax. Pulmonary vascularity appears stable without overt edema. Paucity of bowel gas in the upper abdomen. No acute osseous abnormality identified. IMPRESSION: 1. Continued Moderate pleural effusion(s). 2. Continued dense left lower lobe collapse or consolidation. This may simply be atelectasis but pneumonia is difficult to exclude. Electronically Signed   By: Odessa Fleming M.D.   On: 11/13/2018 18:58   Dg Chest 2 View  Result Date: 10/29/2018 CLINICAL DATA:  CHF pneumonia EXAM: CHEST - 2 VIEW COMPARISON:  10/27/2018 FINDINGS: AP and lateral views of the chest are limited by positioning. Low lung volumes. The cardio pericardial silhouette is enlarged. Persistent bibasilar collapse/consolidation, left greater than right with bilateral moderate pleural effusions. Interstitial pulmonary edema pattern persists. The visualized bony structures of the thorax are intact. Telemetry leads overlie the chest. IMPRESSION: No substantial change in exam. Electronically Signed   By: Kennith Center M.D.   On: 10/29/2018 15:54   Dg Chest 2 View  Result Date: 10/27/2018 CLINICAL DATA:  Shortness of breath today. EXAM: CHEST - 2 VIEW COMPARISON:  Single-view of the chest 01/10/2018, 08/30/2018 and 09/01/2018. FINDINGS: Left worse than right effusions and airspace disease are seen.  Aeration in the right lung base appears mildly improved compared to the most recent examination. Cardiac silhouette is obscured. No pneumothorax. Aortic atherosclerosis noted. IMPRESSION: Left worse than right pleural effusions and airspace disease. Aeration in the right chest is mildly improved since the most recent examination. Atherosclerosis. Electronically Signed   By: Drusilla Kanner M.D.   On: 10/27/2018 14:48   Ct Angio Chest Pe W And/or Wo Contrast  Result Date: 11/14/2018 CLINICAL DATA:  Initial evaluation for acute shortness of breath. History of CHF, COPD. EXAM: CT ANGIOGRAPHY CHEST WITH CONTRAST TECHNIQUE: Multidetector CT imaging of the chest was performed using the standard protocol during bolus administration of intravenous contrast. Multiplanar CT image reconstructions and MIPs were obtained to evaluate the vascular anatomy. CONTRAST:  ISOVUE-370 IOPAMIDOL (ISOVUE-370) INJECTION 76% COMPARISON:  Prior radiograph from earlier the same day. FINDINGS: Cardiovascular: Mild-to-moderate atherosclerotic change noted within the intrathoracic aorta. No aneurysm. Visualized great vessels grossly unremarkable. Moderate cardiomegaly with 3 vessel coronary artery calcifications. No pericardial effusion. Pulmonary arterial tree adequately opacified for evaluation. Main pulmonary artery dilated up to 3.5 cm in diameter. No filling defect to suggest acute pulmonary embolism. Re-formatted imaging confirms these findings. Mediastinum/Nodes: Visualized thyroid within normal limits. Scattered enlarged pre tracheal/precarinal nodes measure up to 13 mm in short axis. 11 mm subcarinal node noted. Mild soft tissue fullness at the bilateral hila without discrete adenopathy. No axillary adenopathy. Esophagus within normal limits. Lungs/Pleura: Proximal airway patent. Large left with moderate right layering pleural effusions. Associated volume loss/atelectasis within the bilateral lower lobes, left greater than  right. Dense opacity within the posterior left upper lobe also favored to reflect atelectatic changes. Superimpose hazy ground-glass  opacity within the aerated portions of the lungs likely reflects mild edema. Superimposed infection would be difficult to exclude, although is less favored. No pneumothorax. Few scattered superimposed tree-in-bud nodular densities noted within the subpleural right middle lobe, which could reflect mild small airways disease and/or mucoid impaction (series 6, image 33). Upper Abdomen: Visualized upper abdomen demonstrates no acute finding. Musculoskeletal: No acute osseous abnormality. No discrete lytic or blastic osseous lesions. Multilevel degenerative spondylolysis noted within the visualized spine. Gynecomastia noted. Mild diffuse anasarca within the subcutaneous soft tissues. Review of the MIP images confirms the above findings. IMPRESSION: 1. She no CT evidence for acute pulmonary embolism. 2. Large left and moderate right layering pleural effusions with associated volume loss/atelectasis within the bilateral lower lobes. Superimposed hazy ground-glass opacity within the aerated portions of the lungs likely reflects mild edema. Superimposed infection would be difficult to exclude, although is less favored. 3. Few superimposed scattered tree-in-bud nodular densities within the subpleural right middle lobe, which may reflect mild small airways disease and/or mucoid impaction. 4. Enlarged mediastinal adenopathy as above, indeterminate, but may be reactive. 5. Cardiomegaly with 3 vessel coronary artery calcifications. Electronically Signed   By: Rise Mu M.D.   On: 11/14/2018 00:27   US Venous Img Lower Bilateral  Result Date: 11/14/2018 CLINICAL DATA:  Bilateral lower extremity edema. History of smoking. Evaluate for DVT. EXAM: BILATERAL LOWER EXTREMITY VENOUS DOPPLER ULTRASOUND TECHNIQUE: Gray-scale sonography with graded compression, as well as color Doppler and  duplex ultrasound were performed to evaluate the lower extremity deep venous systems from the level of the common femoral vein and including the common femoral, femoral, profunda femoral, popliteal and calf veins including the posterior tibial, peroneal and gastrocnemius veins when visible. The superficial great saphenous vein was also interrogated. Spectral Doppler was utilized to evaluate flow at rest and with distal augmentation maneuvers in the common femoral, femoral and popliteal veins. COMPARISON:  None. FINDINGS: RIGHT LOWER EXTREMITY Common Femoral Vein: No evidence of thrombus. Normal compressibility, respiratory phasicity and response to augmentation. Saphenofemoral Junction: No evidence of thrombus. Normal compressibility and flow on color Doppler imaging. Profunda Femoral Vein: No evidence of thrombus. Normal compressibility and flow on color Doppler imaging. Femoral Vein: No evidence of thrombus. Normal compressibility, respiratory phasicity and response to augmentation. Popliteal Vein: No evidence of thrombus. Normal compressibility, respiratory phasicity and response to augmentation. Calf Veins: No evidence of thrombus. Normal compressibility and flow on color Doppler imaging. Superficial Great Saphenous Vein: No evidence of thrombus. Normal compressibility. Venous Reflux:  None. Other Findings:  None. LEFT LOWER EXTREMITY Common Femoral Vein: No evidence of thrombus. Normal compressibility, respiratory phasicity and response to augmentation. Saphenofemoral Junction: No evidence of thrombus. Normal compressibility and flow on color Doppler imaging. Profunda Femoral Vein: No evidence of thrombus. Normal compressibility and flow on color Doppler imaging. Femoral Vein: No evidence of thrombus. Normal compressibility, respiratory phasicity and response to augmentation. Popliteal Vein: No evidence of thrombus. Normal compressibility, respiratory phasicity and response to augmentation. Calf Veins: No  evidence of thrombus. Normal compressibility and flow on color Doppler imaging. Superficial Great Saphenous Vein: No evidence of thrombus. Normal compressibility. Venous Reflux:  None. Other Findings:  None. IMPRESSION: No evidence of DVT within either lower extremity. Electronically Signed   By: Simonne Come M.D.   On: 11/14/2018 09:10   Dg Chest Port 1 View  Result Date: 11/16/2018 CLINICAL DATA:  83 year old male. Pleural effusion. Subsequent encounter. EXAM: PORTABLE CHEST 1 VIEW COMPARISON:  11/13/2018 CT and chest x-ray.  FINDINGS: Left greater than right pleural effusion. Pulmonary vascular congestion/pulmonary edema. Basilar consolidation may reflect atelectasis or infiltrate associated with the pleural effusions. Cardiomegaly. Calcified aorta. No obvious pneumothorax. No acute osseous abnormality noted. IMPRESSION: 1. Overall similar appearance of left greater than right pleural effusion, pulmonary edema and basilar consolidation. 2. Cardiomegaly. 3.  Aortic Atherosclerosis (ICD10-I70.0). Electronically Signed   By: Lacy Duverney M.D.   On: 11/16/2018 08:03        ASSESSMENT/PLAN   Acute hypoxic respiratory failure  - improved - currently on 2Lnc - please wean down to goal SpO2 90-94 -due to acute onset of systolic CHF with EF of 30 to 16%- probably secondary to bacteremia - decreasing Lasix to 40BID.  CXR improved right sided Pleural effusion.   - Foley catheter to be placed today, patient is agreeable, was unable to keep exteral condom catheter held.  Decreased p.o. fluid intake to maximum1200cc/24h.  -net -3600 -PharmD for electrlytes - appreciate repletion -Cardiology on case - Dr Rollen Sox - appreciate input -PT to get OOB improve strength/ROM/mobilize fluid -discussed with patient and wife regarding CKD since serial GFR and creatinine have been normal, they stated no past history of CKD was ever mentioned to them, maybe placed on record by error?   COPD -continue  duo-nebs. No need for steroids patient is not in exacerbation. Will optimize inhalers on outpatient basis   OSA  -continue V 60BiPAP with 1 L O2 bleed in with goal O2 sats 90 to 94% -auto titrated as per RT         Thank you for allowing me to participate in the care of this patient.   This document was prepared using Dragon voice recognition software and may include unintentional dictation errors.     Vida Rigger, M.D.  Division of Pulmonary & Critical Care Medicine  [facility]

## 2018-11-16 NOTE — Evaluation (Signed)
Physical Therapy Evaluation Patient Details Name: Dylan [name] MRN: [id number] DOB: [birth year] Today's Date: 11/16/2018   History of Present Illness  From MD H&P: Pt is an 83 y.o. male with a known history that includes L femur ORIF and LLE quadriceps repair December, 2019, CAD, CHF, MI, COPD, right drop foot, and HTN presented to the emergency room with dyspnea with associated panic attacks as well as orthopnea without worsening lower extremity edema.  He had been having occasional paroxysmal nocturnal dyspnea.  Pt's most recent 2D echo was on 06/21/2018 and it revealed an EF of 30 to 35% with first grade diastolic dysfunction, moderate right ventricular dilation and mild right atrial dilatation with mild mitral regurgitation. Labs were remarkable for mild hypokalemia of 3.3.  The patient had a portable chest x-ray that showed cardiomegaly and continued moderate pleural effusion as well as left lower lobe dense opacity, collapse versus consolidation possibly atelectasis versus pneumonia.  A chest CTA revealed no evidence for PE.  It showed bilateral pleural effusion with atelectasis in the lower lobes with groundglass densities and edema likely secondary to edema with infectious etiology less likely.  It also showed right lower lobe density that could be small airway disease and/over mucoid impaction.  Assessment includes: Acute on chronic systolic and diastolic CHF, Lower respiratory infection with possible mucoid right lung plug, hypokalemia, and HTN.    Clinical Impression  Pt presents with deficits in strength, transfers, mobility, gait, balance, and activity tolerance.  Pt was +2 total assist with bed mobility tasks.  Upon coming to sitting at the EOB the pt required constant mod A to prevent left lateral LOB. The pt attempted multiple sit to/from stands from the EOB without being able to exert any notable force through his BLEs.  Per the pt he has not been able to stand or ambulate since his  multiple LLE surgeries this past December and has 24/7 caregiver support at home along with all necessary equipment to meet his needs.  The pt also stated that is his currently receiving HHPT services.  Pt will benefit from continued HHPT services upon discharge to safely address above deficits for decreased caregiver assistance and eventual return to PLOF.      Follow Up Recommendations Home health PT;Supervision for mobility/OOB (Pt currently receiving HHPT prior to this admission)    Equipment Recommendations  None recommended by PT    Recommendations for Other Services       Precautions / Restrictions Precautions Precautions: Fall Required Braces or Orthoses: Other Brace Other Brace: Per Dr. Rosita Kea pt does not need to wear his brace any longer but may continue to use it for safety purposes if he can not support his weight without buckling Restrictions Weight Bearing Restrictions: Yes LLE Weight Bearing: Weight bearing as tolerated Other Position/Activity Restrictions: Pt may perform L knee flex AROM but no PROM      Mobility  Bed Mobility Overal bed mobility: Needs Assistance Bed Mobility: Supine to Sit;Sit to Supine     Supine to sit: +2 for physical assistance;HOB elevated;Total assist Sit to supine: Total assist;+2 for physical assistance   General bed mobility comments: Pt total assist for all bed mobility tasks  Transfers                 General transfer comment: Pt required constant mod A to prevent left lateral LOB in sitting; pt attempted multiple sit to/from stands from the EOB without being able to exert any notable force through his BLEs  Ambulation/Gait             General Gait Details: Unable/unsafe to attempt  Stairs            Wheelchair Mobility    Modified Rankin (Stroke Patients Only)       Balance Overall balance assessment: Needs assistance Sitting-balance support: Feet supported;Bilateral upper extremity supported Sitting  balance-Leahy Scale: Poor Sitting balance - Comments: Mod A required to prevent left lateral LOB Postural control: Left lateral lean     Standing balance comment: Unable/usafe to attempt                             Pertinent Vitals/Pain Pain Assessment: No/denies pain    Home Living Family/patient expects to be discharged to:: Private residence Living Arrangements: Spouse/significant other Available Help at Discharge: Family;Neighbor;Personal care attendant;Other (Comment)(PCA 24/7) Type of Home: House Home Access: Ramped entrance     Home Layout: One level Home Equipment: Walker - 2 wheels;Cane - single point;Bedside commode;Shower seat;Grab bars - tub/shower;Wheelchair - manual;Other (comment) Additional Comments: Pt has a hospital bed in the living room along with a hoyer lift that his PCA can assist him with    Prior Function Level of Independence: Needs assistance   Gait / Transfers Assistance Needed: Per patient, he has not been able to stand up or ambulate since his L femur ORIF and quad repair December of 2019.  Pt requires a hoyer lift for transfers.   ADL's / Homemaking Assistance Needed: Total assistance with ADLs from his PCA  Comments: Pt has an R AFO     Hand Dominance   Dominant Hand: Right    Extremity/Trunk Assessment   Upper Extremity Assessment Upper Extremity Assessment: Generalized weakness    Lower Extremity Assessment Lower Extremity Assessment: Generalized weakness RLE Deficits / Details: R foot drop with impaired DF ROM noted, chronic per patient and chart review       Communication   Communication: No difficulties  Cognition Arousal/Alertness: Awake/alert Behavior During Therapy: WFL for tasks assessed/performed Overall Cognitive Status: Within Functional Limits for tasks assessed                                        General Comments      Exercises Total Joint Exercises Ankle Circles/Pumps:  AROM;Both;10 reps(Little AROM on the RLE) Quad Sets: Strengthening;Both;10 reps Gluteal Sets: Strengthening;Both;10 reps Hip ABduction/ADduction: AAROM;Both;10 reps Straight Leg Raises: AAROM;Both;10 reps Long Arc Quad: AROM;Both;10 reps;5 reps Knee Flexion: AROM;Both;10 reps;5 reps Other Exercises Other Exercises: Right lateral weight shifting activities in sitting Other Exercises: HEP education for BLE APs, QS, GS, and LAQ x 10 each 5-6x/day   Assessment/Plan    PT Assessment Patient needs continued PT services  PT Problem List Decreased strength;Decreased range of motion;Decreased activity tolerance;Decreased balance;Decreased mobility;Decreased knowledge of precautions;Decreased knowledge of use of DME       PT Treatment Interventions DME instruction;Gait training;Functional mobility training;Therapeutic activities;Therapeutic exercise;Balance training;Patient/family education    PT Goals (Current goals can be found in the Care Plan section)  Acute Rehab PT Goals Patient Stated Goal: To improve strength and to be able to do more for himself PT Goal Formulation: With patient Time For Goal Achievement: 11/29/18 Potential to Achieve Goals: Fair    Frequency Min 2X/week   Barriers to discharge        Co-evaluation  AM-PAC PT "6 Clicks" Mobility  Outcome Measure Help needed turning from your back to your side while in a flat bed without using bedrails?: Total Help needed moving from lying on your back to sitting on the side of a flat bed without using bedrails?: Total Help needed moving to and from a bed to a chair (including a wheelchair)?: Total Help needed standing up from a chair using your arms (e.g., wheelchair or bedside chair)?: Total Help needed to walk in hospital room?: Total Help needed climbing 3-5 steps with a railing? : Total 6 Click Score: 6    End of Session Equipment Utilized During Treatment: Oxygen Activity Tolerance: Patient  tolerated treatment well Patient left: in bed;with call bell/phone within reach;with bed alarm set Nurse Communication: Mobility status PT Visit Diagnosis: Muscle weakness (generalized) (M62.81);Difficulty in walking, not elsewhere classified (R26.2);History of falling (Z91.81)    Time: 0768-0881 PT Time Calculation (min) (ACUTE ONLY): 34 min   Charges:   PT Evaluation $PT Eval Low Complexity: 1 Low PT Treatments $Therapeutic Exercise: 8-22 mins        D. Scott Nussen Pullin PT, DPT 11/16/18, 11:49 AM

## 2018-11-17 ENCOUNTER — Encounter: Payer: Self-pay | Admitting: Pulmonary Disease

## 2018-11-17 ENCOUNTER — Inpatient Hospital Stay (HOSPITAL_COMMUNITY)
Admit: 2018-11-17 | Discharge: 2018-11-17 | Disposition: A | Discharge status: Home / Self Care | Payer: Medicare Other | Attending: Infectious Diseases | Admitting: Infectious Diseases

## 2018-11-17 DIAGNOSIS — I340 Nonrheumatic mitral (valve) insufficiency: Secondary | ICD-10-CM

## 2018-11-17 LAB — CBC
HCT: 31.5 % — ABNORMAL LOW (ref 39.0–52.0)
Hemoglobin: 9.8 g/dL — ABNORMAL LOW (ref 13.0–17.0)
MCH: 31 pg (ref 26.0–34.0)
MCHC: 31.1 g/dL (ref 30.0–36.0)
MCV: 99.7 fL (ref 80.0–100.0)
NRBC: 0 % (ref 0.0–0.2)
PLATELETS: 193 10*3/uL (ref 150–400)
RBC: 3.16 MIL/uL — ABNORMAL LOW (ref 4.22–5.81)
RDW: 17.6 % — AB (ref 11.5–15.5)
WBC: 7.3 10*3/uL (ref 4.0–10.5)

## 2018-11-17 LAB — BASIC METABOLIC PANEL
Anion gap: 6 (ref 5–15)
BUN: 25 mg/dL — ABNORMAL HIGH (ref 8–23)
CO2: 34 mmol/L — ABNORMAL HIGH (ref 22–32)
CREATININE: 0.95 mg/dL (ref 0.61–1.24)
Calcium: 8.4 mg/dL — ABNORMAL LOW (ref 8.9–10.3)
Chloride: 100 mmol/L (ref 98–111)
GFR calc Af Amer: >60 mL/min (ref >60)
GFR calc non Af Amer: >60 mL/min (ref >60)
Glucose, Bld: 95 mg/dL (ref 70–99)
Potassium: 3.7 mmol/L (ref 3.5–5.1)
Sodium: 140 mmol/L (ref 135–145)

## 2018-11-17 LAB — ECHOCARDIOGRAM COMPLETE
Height: 72 in
Weight: 4374.4 oz

## 2018-11-17 LAB — CULTURE, BLOOD (ROUTINE X 2): Special Requests: ADEQUATE

## 2018-11-17 MED ORDER — POTASSIUM CHLORIDE CRYS ER 20 MEQ PO TBCR
20.0000 meq | EXTENDED_RELEASE_TABLET | Freq: Two times a day (BID) | ORAL | Status: DC
  Administered 2018-11-17 – 2018-11-19 (×4): 20 meq via ORAL
  Filled 2018-11-17 (×4): qty 1

## 2018-11-17 MED ORDER — UMECLIDINIUM-VILANTEROL 62.5-25 MCG/INH IN AEPB
1.0000 | INHALATION_SPRAY | Freq: Every day | RESPIRATORY_TRACT | Status: DC
  Administered 2018-11-17 – 2018-11-19 (×3): 1 via RESPIRATORY_TRACT
  Filled 2018-11-17: qty 14

## 2018-11-17 NOTE — Progress Notes (Signed)
Toe wound dressing changed with aquel - ag and then wrapped with gauze.  Skin tear on right shin covered with mepitel and foam.

## 2018-11-17 NOTE — Progress Notes (Signed)
Cardiovascular and Pulmonary Nurse Navigator Note:    Discussion today and yesterday with RNCM and Dr. Sherryll Burger about patient being followed through the CHF Paramedicine Program of [facility].  Collie Siad, RNCM spoke with wife on the phone today about discharge planning including HH, Paramedicine, and Follow-Up Appointments for patient.  This RN contacted Clarisa Kindred, FNP in the [facility].  Referral made to Earmon Phoenix, [place] EMT-Paramedic via email.  Once patient is discharged Georga Bora will contact patient to schedule a date/time to come out to his home for the first visit. The Paramedicine Program is in addition to [facility] - RN - HF ReDS Vest Protocol, OT, PT, Aide.   Will speak with wife this afternoon when she comes to visit patient to inform her of the EMT-Paramedic's name who will be coming out to the home.    Army Melia, RN, BSN, [facility]  [place]  [facility] Cardiac & Pulmonary Rehab  Cardiovascular & Pulmonary Nurse Navigator  Direct Line: [phone]  Department Phone #: [phone] Fax: [phone]  Email Address: [email or web address]

## 2018-11-17 NOTE — Progress Notes (Signed)
Pulmonary Medicine          Date: 11/17/2018,   MRN# [id number] BRIDGES LEISING [birth year]     AdmissionWeight: 125.6 kg                 CurrentWeight: 124 kg          Clinically improved. -4800cc total, weaned to 1LncO2, hemodynamics stable. Will start VEST, repeat CXR in am.  Discussed care plan with son and sister at bedside today. They have concerns may need to be addressed with case management including home health/transport/medical bed/PT etc   PAST MEDICAL HISTORY   Past Medical History:  Diagnosis Date   CAD (coronary artery disease)    Cervicalgia    CHF (congestive heart failure) (HCC)    COPD (chronic obstructive pulmonary disease) (HCC)    Diastolic heart failure (HCC)    Foot drop, right    History of kidney stones    Hyperlipidemia    unspecified   Hypertension    Myocardial infarction (HCC)    Osteoarthritis    Shoulder pain, left    Sleep apnea    Tremor, essential      SURGICAL HISTORY   Past Surgical History:  Procedure Laterality Date   APPLICATION OF WOUND VAC Right 11/13/2017   Procedure: APPLICATION OF WOUND VAC;  Surgeon: Annice Needy, MD;  Location: [facility];  Service: Vascular;  Laterality: Right;   BACK SURGERY  06/2010   CARDIAC CATHETERIZATION Left 04/30/2016   Procedure: Left Heart Cath and Coronary Angiography;  Surgeon: Laurier Nancy, MD;  Location: [facility];  Service: Cardiovascular;  Laterality: Left;   COLONOSCOPY     CORONARY ANGIOPLASTY     CORONARY/GRAFT ACUTE MI REVASCULARIZATION N/A 06/21/2018   Procedure: Coronary/Graft Acute MI Revascularization;  Surgeon: Iran Ouch, MD;  Location: [facility];  Service: Cardiovascular;  Laterality: N/A;   KNEE ARTHROPLASTY Left 06/01/2018   Procedure: COMPUTER ASSISTED TOTAL KNEE ARTHROPLASTY;  Surgeon: Donato Heinz, MD;  Location: [facility];  Service: Orthopedics;  Laterality: Left;   KNEE ARTHROSCOPY Left    KNEE SURGERY  Left 1998   LEFT HEART CATH AND CORONARY ANGIOGRAPHY N/A 06/21/2018   Procedure: LEFT HEART CATH AND CORONARY ANGIOGRAPHY;  Surgeon: Iran Ouch, MD;  Location: [facility];  Service: Cardiovascular;  Laterality: N/A;   ORIF FEMUR FRACTURE Left 08/27/2018   Procedure: OPEN REDUCTION INTERNAL FIXATION (ORIF) SUPRACONDYLAR FEMUR FRACTURE ABOVE PROSTHESIS, QUADRICEPS REPAIR;  Surgeon: Kennedy Bucker, MD;  Location: [facility];  Service: Orthopedics;  Laterality: Left;   TONSILLECTOMY     WOUND DEBRIDEMENT Right 11/13/2017   Procedure: DEBRIDEMENT WOUND;  Surgeon: Annice Needy, MD;  Location: [facility];  Service: Vascular;  Laterality: Right;     FAMILY HISTORY   Family History  Problem Relation Age of Onset   Diabetes Son    Cancer Mother    Colon cancer Mother    Lung cancer Mother    Tremor Father    Heart disease Father    Tremor Brother    Bladder Cancer Brother    Tremor Sister      SOCIAL HISTORY   Social History   Tobacco Use   Smoking status: Former Smoker    Packs/day: 0.25    Years: 58.00    Pack years: 14.50    Types: Cigarettes   Smokeless tobacco: Former Neurosurgeon   Tobacco comment: quit the begginning of septembet 2019  Substance Use Topics   Alcohol use: No    Alcohol/week: 0.0 standard drinks   Drug use: No     MEDICATIONS    Home Medication: as per mar   Current Medication:  Current Facility-Administered Medications:    0.9 %  sodium chloride infusion, 250 mL, Intravenous, PRN, Mansy, Jan A, MD, Last Rate: 10 mL/hr at 11/14/18 0603, 250 mL at 11/14/18 0603   0.9 %  sodium chloride infusion, , Intravenous, PRN, Delfino Lovett, MD, Stopped at 11/16/18 1458   acetaminophen (TYLENOL) tablet 650 mg, 650 mg, Oral, Q4H PRN, Mansy, Jan A, MD, 650 mg at 11/15/18 0930   alum & mag hydroxide-simeth (MAALOX/MYLANTA) 200-200-20 MG/5ML suspension 30 mL, 30 mL, Oral, Q4H PRN, Mansy, Jan A, MD   aspirin EC tablet 81 mg, 81 mg, Oral,  Daily, Mansy, Jan A, MD, 81 mg at 11/16/18 1941   atorvastatin (LIPITOR) tablet 80 mg, 80 mg, Oral, Daily, Mansy, Jan A, MD, 80 mg at 11/16/18 1656   benzonatate (TESSALON) capsule 200 mg, 200 mg, Oral, TID PRN, Mansy, Jan A, MD   bisacodyl (DULCOLAX) suppository 10 mg, 10 mg, Rectal, PRN, Mansy, Jan A, MD   carvedilol (COREG) tablet 6.25 mg, 6.25 mg, Oral, BID WC, Mansy, Jan A, MD, 6.25 mg at 11/16/18 1655   cefTRIAXone (ROCEPHIN) 2 g in sodium chloride 0.9 % 100 mL IVPB, 2 g, Intravenous, Q24H, Delfino Lovett, MD, Stopped at 11/16/18 1303   cholecalciferol (VITAMIN D3) tablet 1,000 Units, 1,000 Units, Oral, Daily, Mansy, Jan A, MD, 1,000 Units at 11/16/18 7408   clopidogrel (PLAVIX) tablet 75 mg, 75 mg, Oral, Daily, Mansy, Jan A, MD, 75 mg at 11/16/18 0936   docusate sodium (COLACE) capsule 100 mg, 100 mg, Oral, Daily PRN, Lule, Joana, PA   enoxaparin (LOVENOX) injection 40 mg, 40 mg, Subcutaneous, Q24H, Marty Heck, RPH, 40 mg at 11/17/18 0631   furosemide (LASIX) tablet 40 mg, 40 mg, Oral, BID, Vida Rigger, MD, 40 mg at 11/16/18 1656   gabapentin (NEURONTIN) capsule 400 mg, 400 mg, Oral, QHS, Mansy, Jan A, MD, 400 mg at 11/16/18 2323   guaiFENesin (MUCINEX) 12 hr tablet 600 mg, 600 mg, Oral, BID, Mansy, Jan A, MD, 600 mg at 11/16/18 2323   ipratropium-albuterol (DUONEB) 0.5-2.5 (3) MG/3ML nebulizer solution 3 mL, 3 mL, Nebulization, Q6H PRN, Mansy, Jan A, MD   ipratropium-albuterol (DUONEB) 0.5-2.5 (3) MG/3ML nebulizer solution 3 mL, 3 mL, Nebulization, Q4H PRN, Mansy, Jan A, MD   ipratropium-albuterol (DUONEB) 0.5-2.5 (3) MG/3ML nebulizer solution 3 mL, 3 mL, Nebulization, TID, Sherryll Burger, Vipul, MD, 3 mL at 11/16/18 2126   isosorbide mononitrate (IMDUR) 24 hr tablet 30 mg, 30 mg, Oral, Daily, Mansy, Jan A, MD, 30 mg at 11/16/18 1448   ketotifen (ZADITOR) 0.025 % ophthalmic solution 1 drop, 1 drop, Both Eyes, BID, Mansy, Jan A, MD, 1 drop at 11/16/18 2324   loratadine  (CLARITIN) tablet 10 mg, 10 mg, Oral, Daily PRN, Mansy, Jan A, MD   magnesium hydroxide (MILK OF MAGNESIA) suspension 30 mL, 30 mL, Oral, Q4H PRN, Mansy, Jan A, MD   Melatonin TABS 5 mg, 5 mg, Oral, QHS, Mansy, Jan A, MD, 5 mg at 11/16/18 2323   multivitamin with minerals tablet 1 tablet, 1 tablet, Oral, Daily, Mansy, Jan A, MD, 1 tablet at 11/16/18 0936   nitroGLYCERIN (NITROSTAT) SL tablet 0.4 mg, 0.4 mg, Sublingual, Q5 min PRN, Mansy, Jan A, MD   ondansetron (ZOFRAN) injection 4 mg, 4 mg, Intravenous,  Q6H PRN, Mansy, Jan A, MD   polyethylene glycol (MIRALAX / GLYCOLAX) packet 17 g, 17 g, Oral, BID PRN, Mansy, Jan A, MD   polyvinyl alcohol (LIQUIFILM TEARS) 1.4 % ophthalmic solution 2 drop, 2 drop, Both Eyes, PRN, Delfino Lovett, MD, 2 drop at 11/16/18 0936   potassium chloride SA (K-DUR,KLOR-CON) CR tablet 20 mEq, 20 mEq, Oral, Daily, Marty Heck, RPH   primidone (MYSOLINE) tablet 200 mg, 200 mg, Oral, BID, Mansy, Jan A, MD, 200 mg at 11/16/18 2323   saccharomyces boulardii (FLORASTOR) capsule 250 mg, 250 mg, Oral, BID, Mansy, Jan A, MD, 250 mg at 11/16/18 2324   sacubitril-valsartan (ENTRESTO) 24-26 mg per tablet, 1 tablet, Oral, BID, Mansy, Jan A, MD, 1 tablet at 11/16/18 2323   senna-docusate (Senokot-S) tablet 2 tablet, 2 tablet, Oral, BID PRN, Mansy, Jan A, MD   sodium chloride flush (NS) 0.9 % injection 3 mL, 3 mL, Intravenous, Q12H, Mansy, Jan A, MD, 3 mL at 11/16/18 2324   sodium chloride flush (NS) 0.9 % injection 3 mL, 3 mL, Intravenous, PRN, Mansy, Jan A, MD, 3 mL at 11/15/18 0505   spironolactone (ALDACTONE) tablet 12.5 mg, 12.5 mg, Oral, Daily, Mansy, Jan A, MD, 12.5 mg at 11/16/18 1610   tamsulosin (FLOMAX) capsule 0.4 mg, 0.4 mg, Oral, Daily, Mansy, Jan A, MD, 0.4 mg at 11/16/18 9604   vitamin C (ASCORBIC ACID) tablet 500 mg, 500 mg, Oral, Daily, Mansy, Jan A, MD, 500 mg at 11/16/18 5409   zolpidem (AMBIEN) tablet 5 mg, 5 mg, Oral, QHS PRN, Mansy, Vernetta Honey,  MD    ALLERGIES   Patient has no known allergies.     REVIEW OF SYSTEMS    Review of Systems:  Gen:  Denies  fever, sweats, chills weigh loss  HEENT: Denies blurred vision, double vision, ear pain, eye pain, hearing loss, nose bleeds, sore throat Cardiac:  No dizziness, chest pain or heaviness, chest tightness,edema Resp:   Denies cough or sputum porduction, shortness of breath,wheezing, hemoptysis,  Gi: Denies swallowing difficulty, stomach pain, nausea or vomiting, diarrhea, constipation, bowel incontinence Gu:  Denies bladder incontinence, burning urine Ext:   Denies Joint pain, stiffness or swelling Skin: Denies  skin rash, easy bruising or bleeding or hives Endoc:  Denies polyuria, polydipsia , polyphagia or weight change Psych:   Denies depression, insomnia or hallucinations   Other:  All other systems negative   VS: BP 125/89 (BP Location: Right Arm)    Pulse 61    Temp 36.9 C (Oral)    Resp (!) 24    Ht 6' (1.829 m)    Wt 124 kg    SpO2 98%    BMI 37.08 kg/m      PHYSICAL EXAM    GENERAL:NAD, no fevers, chills, no weakness no fatigue HEAD: Normocephalic, atraumatic.  EYES: Pupils equal, round, reactive to light. Extraocular muscles intact. No scleral icterus.  MOUTH: Moist mucosal membrane. Dentition intact. No abscess noted.  EAR, NOSE, THROAT: Clear without exudates. No external lesions.  NECK: Supple. No thyromegaly. No nodules. No JVD.  PULMONARY: Diffuse coarse rhonchi right sided +wheezes CARDIOVASCULAR: S1 and S2. Regular rate and rhythm. No murmurs, rubs, or gallops. No edema. Pedal pulses 2+ bilaterally.  GASTROINTESTINAL: Soft, nontender, nondistended. No masses. Positive bowel sounds. No hepatosplenomegaly.  MUSCULOSKELETAL: No swelling, clubbing, or edema. Range of motion full in all extremities.  NEUROLOGIC: Cranial nerves II through XII are intact. No gross focal neurological deficits. Sensation intact. Reflexes intact.  SKIN: No ulceration,  lesions, rashes, or cyanosis. Skin warm and dry. Turgor intact.  PSYCHIATRIC: Mood, affect within normal limits. The patient is awake, alert and oriented x 3. Insight, judgment intact.       IMAGING    Dg Chest 2 View  Result Date: 11/13/2018 CLINICAL DATA:  83 year old male treated for pneumonia 1 month ago. Shortness of breath, on oxygen. EXAM: CHEST - 2 VIEW COMPARISON:  10/29/2018 and earlier. FINDINGS: Chronic cardiomegaly. Persistent pleural effusion(s) appears moderate size on the lateral. Continued abrupt termination of gas in the left mainstem bronchus and dense left lung base opacity. No superimposed pneumothorax. Pulmonary vascularity appears stable without overt edema. Paucity of bowel gas in the upper abdomen. No acute osseous abnormality identified. IMPRESSION: 1. Continued Moderate pleural effusion(s). 2. Continued dense left lower lobe collapse or consolidation. This may simply be atelectasis but pneumonia is difficult to exclude. Electronically Signed   By: Odessa Fleming M.D.   On: 11/13/2018 18:58   Dg Chest 2 View  Result Date: 10/29/2018 CLINICAL DATA:  CHF pneumonia EXAM: CHEST - 2 VIEW COMPARISON:  10/27/2018 FINDINGS: AP and lateral views of the chest are limited by positioning. Low lung volumes. The cardio pericardial silhouette is enlarged. Persistent bibasilar collapse/consolidation, left greater than right with bilateral moderate pleural effusions. Interstitial pulmonary edema pattern persists. The visualized bony structures of the thorax are intact. Telemetry leads overlie the chest. IMPRESSION: No substantial change in exam. Electronically Signed   By: Kennith Center M.D.   On: 10/29/2018 15:54   Dg Chest 2 View  Result Date: 10/27/2018 CLINICAL DATA:  Shortness of breath today. EXAM: CHEST - 2 VIEW COMPARISON:  Single-view of the chest 01/10/2018, 08/30/2018 and 09/01/2018. FINDINGS: Left worse than right effusions and airspace disease are seen. Aeration in the right lung  base appears mildly improved compared to the most recent examination. Cardiac silhouette is obscured. No pneumothorax. Aortic atherosclerosis noted. IMPRESSION: Left worse than right pleural effusions and airspace disease. Aeration in the right chest is mildly improved since the most recent examination. Atherosclerosis. Electronically Signed   By: Drusilla Kanner M.D.   On: 10/27/2018 14:48   Ct Angio Chest Pe W And/or Wo Contrast  Result Date: 11/14/2018 CLINICAL DATA:  Initial evaluation for acute shortness of breath. History of CHF, COPD. EXAM: CT ANGIOGRAPHY CHEST WITH CONTRAST TECHNIQUE: Multidetector CT imaging of the chest was performed using the standard protocol during bolus administration of intravenous contrast. Multiplanar CT image reconstructions and MIPs were obtained to evaluate the vascular anatomy. CONTRAST:  ISOVUE-370 IOPAMIDOL (ISOVUE-370) INJECTION 76% COMPARISON:  Prior radiograph from earlier the same day. FINDINGS: Cardiovascular: Mild-to-moderate atherosclerotic change noted within the intrathoracic aorta. No aneurysm. Visualized great vessels grossly unremarkable. Moderate cardiomegaly with 3 vessel coronary artery calcifications. No pericardial effusion. Pulmonary arterial tree adequately opacified for evaluation. Main pulmonary artery dilated up to 3.5 cm in diameter. No filling defect to suggest acute pulmonary embolism. Re-formatted imaging confirms these findings. Mediastinum/Nodes: Visualized thyroid within normal limits. Scattered enlarged pre tracheal/precarinal nodes measure up to 13 mm in short axis. 11 mm subcarinal node noted. Mild soft tissue fullness at the bilateral hila without discrete adenopathy. No axillary adenopathy. Esophagus within normal limits. Lungs/Pleura: Proximal airway patent. Large left with moderate right layering pleural effusions. Associated volume loss/atelectasis within the bilateral lower lobes, left greater than right. Dense opacity within  the posterior left upper lobe also favored to reflect atelectatic changes. Superimpose hazy ground-glass opacity within the aerated portions of  the lungs likely reflects mild edema. Superimposed infection would be difficult to exclude, although is less favored. No pneumothorax. Few scattered superimposed tree-in-bud nodular densities noted within the subpleural right middle lobe, which could reflect mild small airways disease and/or mucoid impaction (series 6, image 33). Upper Abdomen: Visualized upper abdomen demonstrates no acute finding. Musculoskeletal: No acute osseous abnormality. No discrete lytic or blastic osseous lesions. Multilevel degenerative spondylolysis noted within the visualized spine. Gynecomastia noted. Mild diffuse anasarca within the subcutaneous soft tissues. Review of the MIP images confirms the above findings. IMPRESSION: 1. She no CT evidence for acute pulmonary embolism. 2. Large left and moderate right layering pleural effusions with associated volume loss/atelectasis within the bilateral lower lobes. Superimposed hazy ground-glass opacity within the aerated portions of the lungs likely reflects mild edema. Superimposed infection would be difficult to exclude, although is less favored. 3. Few superimposed scattered tree-in-bud nodular densities within the subpleural right middle lobe, which may reflect mild small airways disease and/or mucoid impaction. 4. Enlarged mediastinal adenopathy as above, indeterminate, but may be reactive. 5. Cardiomegaly with 3 vessel coronary artery calcifications. Electronically Signed   By: Rise Mu M.D.   On: 11/14/2018 00:27   US Venous Img Lower Bilateral  Result Date: 11/14/2018 CLINICAL DATA:  Bilateral lower extremity edema. History of smoking. Evaluate for DVT. EXAM: BILATERAL LOWER EXTREMITY VENOUS DOPPLER ULTRASOUND TECHNIQUE: Gray-scale sonography with graded compression, as well as color Doppler and duplex ultrasound were performed  to evaluate the lower extremity deep venous systems from the level of the common femoral vein and including the common femoral, femoral, profunda femoral, popliteal and calf veins including the posterior tibial, peroneal and gastrocnemius veins when visible. The superficial great saphenous vein was also interrogated. Spectral Doppler was utilized to evaluate flow at rest and with distal augmentation maneuvers in the common femoral, femoral and popliteal veins. COMPARISON:  None. FINDINGS: RIGHT LOWER EXTREMITY Common Femoral Vein: No evidence of thrombus. Normal compressibility, respiratory phasicity and response to augmentation. Saphenofemoral Junction: No evidence of thrombus. Normal compressibility and flow on color Doppler imaging. Profunda Femoral Vein: No evidence of thrombus. Normal compressibility and flow on color Doppler imaging. Femoral Vein: No evidence of thrombus. Normal compressibility, respiratory phasicity and response to augmentation. Popliteal Vein: No evidence of thrombus. Normal compressibility, respiratory phasicity and response to augmentation. Calf Veins: No evidence of thrombus. Normal compressibility and flow on color Doppler imaging. Superficial Great Saphenous Vein: No evidence of thrombus. Normal compressibility. Venous Reflux:  None. Other Findings:  None. LEFT LOWER EXTREMITY Common Femoral Vein: No evidence of thrombus. Normal compressibility, respiratory phasicity and response to augmentation. Saphenofemoral Junction: No evidence of thrombus. Normal compressibility and flow on color Doppler imaging. Profunda Femoral Vein: No evidence of thrombus. Normal compressibility and flow on color Doppler imaging. Femoral Vein: No evidence of thrombus. Normal compressibility, respiratory phasicity and response to augmentation. Popliteal Vein: No evidence of thrombus. Normal compressibility, respiratory phasicity and response to augmentation. Calf Veins: No evidence of thrombus. Normal  compressibility and flow on color Doppler imaging. Superficial Great Saphenous Vein: No evidence of thrombus. Normal compressibility. Venous Reflux:  None. Other Findings:  None. IMPRESSION: No evidence of DVT within either lower extremity. Electronically Signed   By: Simonne Come M.D.   On: 11/14/2018 09:10   Dg Chest Port 1 View  Result Date: 11/16/2018 CLINICAL DATA:  83 year old male. Pleural effusion. Subsequent encounter. EXAM: PORTABLE CHEST 1 VIEW COMPARISON:  11/13/2018 CT and chest x-ray. FINDINGS: Left greater than right pleural  effusion. Pulmonary vascular congestion/pulmonary edema. Basilar consolidation may reflect atelectasis or infiltrate associated with the pleural effusions. Cardiomegaly. Calcified aorta. No obvious pneumothorax. No acute osseous abnormality noted. IMPRESSION: 1. Overall similar appearance of left greater than right pleural effusion, pulmonary edema and basilar consolidation. 2. Cardiomegaly. 3.  Aortic Atherosclerosis (ICD10-I70.0). Electronically Signed   By: Lacy Duverney M.D.   On: 11/16/2018 08:03      ASSESSMENT/PLAN    Acute hypoxic respiratory failure - improved - currently on 2Lnc - please wean down to goal SpO2 90-94 -due to acute onset of systolic CHF with EF of 30 to 40%- probably secondary to bacteremia-rocephin iv - decreasing Lasix to  daily.  CXR improved right sided Pleural effusion.  - Foley catheter to be placed today, patient is agreeable, was unable to keep exteral condom catheter held.Decreased p.o. fluid intake to maximum1200cc/24h.  -net -5217L -PharmD for electrlytes - appreciate repletion -Cardiology on case - Dr Rollen Sox - appreciate input -PT to get OOB improve strength/ROM/mobilize fluid -discussed with patient and wife regarding CKD since serial GFR and creatinine have been normal, they stated no past history of CKD was ever mentioned to them, maybe placed on record by error?   COPD -continue duo-nebs. No need  for steroids patient is not in exacerbation. Will optimize inhalers on outpatient basis   OSA -continueV 60BiPAP with 1 L O2 bleed in with goal O2 sats 90 to 94% -auto titrated as per RT -beard trimmed for proper seal with interface       Thank you for allowing me to participate in the care of this patient.  Total face to face encounter time for this patient visit was     Patient/Family are satisfied with care plan and all questions have been answered.  This document was prepared using Dragon voice recognition software and may include unintentional dictation errors.     Vida Rigger, M.D.  Division of Pulmonary & Critical Care Medicine  [facility]

## 2018-11-17 NOTE — TOC Progression Note (Addendum)
Transition of Care [facility]) - Progression Note    Patient Details  Name: Dylan [name] MRN: [id number] Date of Birth: 01/04/1936  Transition of Care [facility]) CM/SW Contact  Marshell Garfinkel, RN Phone Number: 11/17/2018, 11:20 AM  Clinical Narrative:     This RNCM met with patient to drop off Important Message from Medicare.  He states he "wants to go home and is bored. I need to move".  RN and PT updated of this request to get out of bed.  I spoke with wife by phone and she is not ready for him to return home because he "will just have to come right back to the hospital".  She has requested a [employer] fast streamer from PCP but per Ivin Booty at Dr. Juanell Fairly office, Medicare declined it because patient doesn't meet criteria (300lbs). Patient will need EMS to home and to PCP appointments. He has not been seen since last December. Wife at first gave me permission to make appointments with Dr. Hall Busing and Dr. Clayborn Bigness and then while I was on the phone with Dr. Sondra Come office, wife called in too requesting that I not make appointment.  Per Ivin Booty, "wife is a nurse too and can handle it".  I explained the importance of patient following up with providers and Ivin Booty said that "Dr. Hall Busing will work with them; we've known them a long time".  I have updated Card rehab nurse navigator.  He is full code. It appears he is undecided about palliative services at discharge. Plan: Wife to call for follow up appointments, Paramedicine, [facility] ReDS vests, Chronic O2 in place through [employer], and considering palliative. Update: Wife is now allowing appointments to be scheduled by this RNCM. She requests 2 appointments be on the same day. These appointments have been shared with her and she agrees (PCP, Cards, and Card Rehab in place).   Expected Discharge Plan: [employer] Barriers to Discharge: Continued Medical Work up  Expected Discharge Plan and Services Expected Discharge Plan: [place] Discharge  Planning Services: CM Consult Post Acute Care Choice: Home Health, Resumption of Svcs/PTA Provider Living arrangements for the past 2 months: Single Family Home                     HH Arranged: RN, PT, Nurse's Aide, OT(ReDS Vest) [place]: [facility] ([facility])   Social Determinants of Health (SDOH) Interventions    Readmission Risk Interventions 30 Day Unplanned Readmission Risk Score     ED to Hosp-Admission (Current) from 11/13/2018 in [place] (2A)  30 Day Unplanned Readmission Risk Score (%)  49 Filed at 11/17/2018 0801     This score is the patient's risk of an unplanned readmission within 30 days of being discharged (0 -100%). The score is based on dignosis, age, lab data, medications, orders, and past utilization.   Low:  0-14.9   Medium: 15-21.9   High: 22-29.9   Extreme: 30 and above       Readmission Risk Prevention Plan 11/17/2018 11/17/2018  Transportation Screening - Complete  PCP or Specialist appointment within 3-5 days of discharge Patient refused -  PCP/Specialist Appt Not Complete comments wife is nurse; Dr. Hall Busing agrees to sign orders without seeing patient per Ivin Booty at [facility] office; wife agrees to make appt. -  HRI or Home Care Consult (No Data) Complete  SW Recovery Care/Counseling Consult - Complete  Palliative Care Screening - Complete  Skilled Nursing Facility - Not Complete  SNF Comments - patient declined  Some recent data might be hidden

## 2018-11-17 NOTE — Care Management Important Message (Signed)
Important Message  Patient Details  Name: Dylan [name] MRN: [id number] Date of Birth: [birth year]   Medicare Important Message Given:  Yes    Collie Siad, RN 11/17/2018, 10:17 AM

## 2018-11-17 NOTE — Consult Note (Signed)
PHARMACY CONSULT NOTE - FOLLOW UP  Pharmacy Consult for Electrolyte Monitoring and Replacement   Recent Labs: Potassium (mmol/L)  Date Value  11/17/2018 3.7  12/02/2012 3.1 (L)   Magnesium (mg/dL)  Date Value  27/78/2423 1.8   Calcium (mg/dL)  Date Value  53/61/4431 8.4 (L)   Calcium, Total (mg/dL)  Date Value  54/00/8676 8.2 (L)   Albumin (g/dL)  Date Value  19/50/9326 2.6 (L)  12/02/2012 3.3 (L)   Sodium (mmol/L)  Date Value  11/17/2018 140  12/02/2012 142   Assessment: 83 YOM with an electrolyte imbalance while aggressively diuresing. He is currently receiving Lasix 80 mg IV q12h - last dose given 3/16 at 0643. No active diuretic order at this time.  Other medications that may affect potassium levels include spironolactone 12.5mg  daily and Entrest 24/26 mg BID. Potassium level has significantly improved since adding therapy.  Plan:  3/17 -K 3.7 Scr 0.95  Patient on Lasix 40 po bid, KCL PO daily, Entresto, spironolactone -will transition KCL to 20 meq BID. ---electrolyte levels have been ordered and pharmacy will replace as needed  Angelique Blonder ,PharmD Clinical Pharmacist 11/17/2018 8:32 AM

## 2018-11-17 NOTE — Progress Notes (Signed)
[facility] - [place] at [facility]   PATIENT NAME: Dylan [name]    MR#:  [id number]  DATE OF BIRTH:  21-Sep-1935  SUBJECTIVE:  CHIEF COMPLAINT:   Chief Complaint  Patient presents with  . Shortness of Breath   Seen resting in bed with sister at bedside. + shortness of breath, unchanged. On 2 L O2. Awaiting results of Echo to rule out endocarditis. Toe wound dressing and skin tear dressing of lower extremities changed by RN today. Worked with PT yesterday to the edge of bed, would like to do more. 3 days since last BM   REVIEW OF SYSTEMS:  Review of Systems  Constitutional: + for fatigue. Negative fordiaphoresis,fever,malaise,and weight loss.  HENT: Negative forear discharge,ear pain,hearing loss,nosebleeds,sore throatand tinnitus.  Eyes: Negative forblurred visionand pain.  Respiratory: Negative forshortness of breath. Negative forcough,hemoptysisand wheezing.  Cardiovascular: Negative forchest pain,palpitations,orthopneaand leg swelling.  Gastrointestinal: Positive for constipation. Negative forabdominal pain,blood in stool,diarrhea,heartburn,nauseaand vomiting.  Genitourinary: Negative fordysuria,frequencyand urgency.  Musculoskeletal: Negative forback painand myalgias.  Skin: Negative foritchingand rash.  Neurological: Negative fordizziness,tingling,tremors,focal weakness,seizures,weaknessand headaches.  Psychiatric/Behavioral: Negative fordepression. The patientis not nervous/anxious. DRUG ALLERGIES:  No Known Allergies VITALS:  Blood pressure (!) 158/51, pulse 60, temperature 98.4 F (36.9 C), resp. rate 18, height 6' (1.829 m), weight 124 kg, SpO2 99 %. PHYSICAL EXAMINATION:  Physical Exam Constitutional:  Appearance: He is morbidly obese. He isill-appearing.   HENT:  Head: Normocephalicand atraumatic.  Eyes:  Conjunctiva/sclera: Conjunctivae normal.  Pupils: Pupils are equal, round, and  reactive to light.  Neck:  Musculoskeletal: Normal range of motionand neck supple.  Thyroid: No thyromegaly.  Trachea: No tracheal deviation.  Cardiovascular:  Rate and Rhythm: Normal rateand regular rhythm.  Heart sounds: Normal heart sounds.  Pulmonary:  Effort: Pulmonary effort is normal. Norespiratory distress.  Breath sounds: Decreased breath sounds. Bibasilar crackles are present. Nowheezing.  Chest:  Chest wall: No tenderness.  Abdominal:  General: Bowel sounds are normal. There is distension and some dependent flank edema.  Palpations: Abdomen is soft.  Tenderness: There is no abdominal tenderness.  Musculoskeletal:Normal range of motion. Generalized weakness. Skin: General: Skin is warmand dry.  Findings: No rash.  Neurological:  Mental Status: He is alertand oriented to person, place, and time.  Cranial Nerves: No cranial nerve deficit. He has a condom catheter.  LABORATORY PANEL:  Male CBC Recent Labs  Lab 11/17/18 0422  WBC 7.3  HGB 9.8*  HCT 31.5*  PLT 193   ------------------------------------------------------------------------------------------------------------------ Chemistries  Recent Labs  Lab 11/14/18 0638 11/15/18 0533  11/17/18 0422  NA 139 142   < > 140  K 3.2* 4.1   < > 3.7  CL 99 102   < > 100  CO2 30 32   < > 34*  GLUCOSE 105* 104*   < > 95  BUN 30* 30*   < > 25*  CREATININE 0.96 1.13   < > 0.95  CALCIUM 8.6* 8.4*   < > 8.4*  MG 1.8 1.8  --   --   AST 42*  --   --   --   ALT 46*  --   --   --   ALKPHOS 119  --   --   --   BILITOT 0.7  --   --   --    < > = values in this interval not displayed.   RADIOLOGY:  No results found. ASSESSMENT AND PLAN:   [id number] M40 y.o.malewith a known history of multiple  medical problems that will be mentioned below who presented to the emergency room with dyspnea with associated panic attacks as well as orthopnea without worsening  lower extremity edema. He has been having mild chest congestion without significant cough or wheezing. No fever or chills. No nausea vomiting or abdominal pain. He has been having occasional paroxysmal nocturnal dyspnea. He denied any rhinorrhea or nasal congestion or sore throat or earache. No dysuria, oliguria or hematuria or flank pain. His most recent 2D echo was on 06/21/2018 and it revealed an EF of 30 to 35% with first grade diastolic dysfunction, moderate right ventricular dilation and mild right atrial dilatation with mild mitral regurgitation.  The patient had a portable chest x-ray that showed cardiomegaly and continued moderate pleural effusion as well as left lower lobe dense opacity, collapse versus consolidation possibly atelectasis versus pneumonia. A chest CTA revealed no evidence for PE. It showed bilateral pleural effusion with atelectasis in the lower lobes with groundglass densities and edema likely secondary to edema with infectious etiology less likely. It also showed right lower lobe density that could be small airway disease and/over mucoid impaction.EKG was suboptimal due to motion and showed likely accelerated junctional rhythm with a rate of 65 with nonspecific intraventricular conduction delay.   Acute on chronic systolic and diastolic CHF. The patient's last EF was 30 to 35% with grade 1 diastolic dysfunction within less than 6 months. Therefore I will not repeat it for now.was onIV Lasix 80 BID. Now on Lasix 40 mg BID. negserial cardiac enzyme. Appreciatecardiology consult by Dr. Gwen Pounds. continue Aldactone. - Entresto started 11/14/18 per cardiology - contribution likely secondary to bacteremia, see below   Lower respiratory infection with possible mucoid right lung plug. His groundglass densities are more consistent with edema. - empiricIV Rocephin and p.o. Zithromax and will follow blood culture as well as obtain sputum Gram stain culture and  sensitivity. Zithromax was discontinued per record. ID to see patient as below, await recommendations.   - continuescheduled and as needed DuoNeb as well as Mucinex.Appreciate Pulmo input  Bacteremia, rothia mucilanginosa, streptococcus viridans Repeat blood cultures ordered today, will follow for growth. None so far. ID Dr. Rivka Safer consulted, ordered echo to evaluate for infective endocarditis.   Hypokalemia: repleted and resolved. Pharmacy following, appreciate input.   Hypertension continue Coreg, Imdur, aldactone.  Dyslipidemia.continue his statin therapy while following LFTs.  Elevated fibrin derivative d-dimer. His chest CTA was negative for PE. bilateral lower extremity venous duplexnegative as well  OSA BiPAP per Pulmonology Dr. Karna Christmas  COPD continue duonebs. No need for steroids. Inhaler optimization per Pulmonology  DVT prophylaxis.on subcutaneous Lovenox.  Palliative care following due to his multiple comorbidities  Constipation bowel regimen ordered, monitor daily. RN gave Senna this AM. No BM 3 days.  CM and cardiac rehab RN following for discharge needs: [facility] [place] - also HH - RN - HF ReDS Vest Protocol, OT, PT, Aide.    All the records are reviewed and case is discussed with Care Management/Social Worker. Management plans discussed with the patient and/or family and they are in agreement.  CODE STATUS: Full Code  TOTAL TIME TAKING CARE OF THIS PATIENT: 30 minutes.   More than 50% of the time was spent in counseling/coordination of care: YES  POSSIBLE D/C IN 1-2 DAYS, DEPENDING ON CLINICAL CONDITION.   Tukker Byrns PA-C on 11/17/2018 at 4:00 PM  Between 7am to 6pm - Pager - [phone]  After 6 pm go to [email or web address] - password [email or web address]  [facility]  [facility] [place] [facility]  Office  [phone]  CC: Primary care physician; Jaclyn Shaggyate, [name], MD  Note: This dictation was prepared with Dragon dictation  along with smaller phrase technology. Any transcriptional errors that result from this process are unintentional.

## 2018-11-17 NOTE — Progress Notes (Signed)
*  PRELIMINARY RESULTS* Echocardiogram 2D Echocardiogram has been performed.  Nayleen Janosik M Rocio Wolak 11/17/2018, 2:28 PM 

## 2018-11-18 ENCOUNTER — Encounter
Admission: EM | Disposition: A | Discharge status: Home Health | Payer: Self-pay | Source: Home / Self Care | Attending: Internal Medicine

## 2018-11-18 ENCOUNTER — Inpatient Hospital Stay: Payer: Medicare Other

## 2018-11-18 ENCOUNTER — Inpatient Hospital Stay
Admit: 2018-11-18 | Discharge: 2018-11-18 | Disposition: A | Discharge status: Home / Self Care | Payer: Medicare Other | Attending: Internal Medicine | Admitting: Internal Medicine

## 2018-11-18 HISTORY — PX: TEE WITHOUT CARDIOVERSION: SHX5443

## 2018-11-18 LAB — CBC
HCT: 31.6 % — ABNORMAL LOW (ref 39.0–52.0)
Hemoglobin: 9.7 g/dL — ABNORMAL LOW (ref 13.0–17.0)
MCH: 31 pg (ref 26.0–34.0)
MCHC: 30.7 g/dL (ref 30.0–36.0)
MCV: 101 fL — ABNORMAL HIGH (ref 80.0–100.0)
Platelets: 183 10*3/uL (ref 150–400)
RBC: 3.13 MIL/uL — AB (ref 4.22–5.81)
RDW: 17.7 % — ABNORMAL HIGH (ref 11.5–15.5)
WBC: 5.7 10*3/uL (ref 4.0–10.5)
nRBC: 0 % (ref 0.0–0.2)

## 2018-11-18 LAB — COMPREHENSIVE METABOLIC PANEL
ALK PHOS: 108 U/L (ref 38–126)
ALT: 31 U/L (ref 0–44)
ANION GAP: 8 (ref 5–15)
AST: 33 U/L (ref 15–41)
Albumin: 2.7 g/dL — ABNORMAL LOW (ref 3.5–5.0)
BUN: 25 mg/dL — ABNORMAL HIGH (ref 8–23)
CALCIUM: 8.6 mg/dL — AB (ref 8.9–10.3)
CO2: 33 mmol/L — ABNORMAL HIGH (ref 22–32)
Chloride: 99 mmol/L (ref 98–111)
Creatinine, Ser: 0.96 mg/dL (ref 0.61–1.24)
GFR calc Af Amer: >60 mL/min (ref >60)
GFR calc non Af Amer: >60 mL/min (ref >60)
Glucose, Bld: 88 mg/dL (ref 70–99)
Potassium: 3.8 mmol/L (ref 3.5–5.1)
Sodium: 140 mmol/L (ref 135–145)
Total Bilirubin: 0.6 mg/dL (ref 0.3–1.2)
Total Protein: 6 g/dL — ABNORMAL LOW (ref 6.5–8.1)

## 2018-11-18 SURGERY — ECHOCARDIOGRAM, TRANSESOPHAGEAL
Anesthesia: Moderate Sedation

## 2018-11-18 MED ORDER — SENNOSIDES-DOCUSATE SODIUM 8.6-50 MG PO TABS
2.0000 | ORAL_TABLET | Freq: Two times a day (BID) | ORAL | Status: DC
  Administered 2018-11-18 – 2018-11-19 (×3): 2 via ORAL
  Filled 2018-11-18 (×3): qty 2

## 2018-11-18 MED ORDER — BUTAMBEN-TETRACAINE-BENZOCAINE 2-2-14 % EX AERO
INHALATION_SPRAY | CUTANEOUS | Status: AC
  Administered 2018-11-18: 3
  Filled 2018-11-18: qty 5

## 2018-11-18 MED ORDER — MIDAZOLAM HCL 5 MG/5ML IJ SOLN
INTRAMUSCULAR | Status: AC | PRN
  Administered 2018-11-18: 2 mg via INTRAVENOUS

## 2018-11-18 MED ORDER — FENTANYL CITRATE (PF) 100 MCG/2ML IJ SOLN
INTRAMUSCULAR | Status: AC | PRN
  Administered 2018-11-18: 50 ug via INTRAVENOUS

## 2018-11-18 MED ORDER — FENTANYL CITRATE (PF) 100 MCG/2ML IJ SOLN
INTRAMUSCULAR | Status: AC
  Filled 2018-11-18: qty 2

## 2018-11-18 MED ORDER — LIDOCAINE VISCOUS HCL 2 % MT SOLN
OROMUCOSAL | Status: AC
  Administered 2018-11-18: 15 mL
  Filled 2018-11-18: qty 15

## 2018-11-18 MED ORDER — MIDAZOLAM HCL 5 MG/5ML IJ SOLN
INTRAMUSCULAR | Status: AC
  Filled 2018-11-18: qty 5

## 2018-11-18 MED ORDER — SODIUM CHLORIDE 0.9 % IV SOLN
INTRAVENOUS | Status: DC
  Administered 2018-11-18: 1000 mL via INTRAVENOUS

## 2018-11-18 MED ORDER — SODIUM CHLORIDE FLUSH 0.9 % IV SOLN
INTRAVENOUS | Status: AC
  Filled 2018-11-18: qty 10

## 2018-11-18 NOTE — Progress Notes (Addendum)
[facility] Encounter Note  Patient: Dylan [name] / Admit Date: 11/13/2018 / Date of Encounter: 11/18/2018, 8:29 AM Patient having some improvements overall but still having some short periods of shortness of breath and anxious.  No evidence of myocardial infarction or acute coronary syndrome at this time with a troponin of 0.06.  The patient does continue to have anemia chronic kidney disease and pleural effusions likely causing some of his symptoms.  Additionally the patient continues to have bacteremia with strep viridans possibly consistent with either endocarditis as well as the possibility of prosthetic knee infection.   Echocardiogram showing mild global LV systolic dysfunction without evidence of apparent endocarditis.  Ejection fraction 40 to 45% TEE shows continued lv dysfunction ef 35-40% without evidence of endocarditis Review of Systems: Positive for: Shortness of breath weakness Negative for: Vision change, hearing change, syncope, dizziness, nausea, vomiting,diarrhea, bloody stool, stomach pain, cough, congestion, diaphoresis, urinary frequency, urinary pain,skin lesions, skin rashes Others previously listed  Objective: Telemetry: Normal sinus rhythm Physical Exam: Blood pressure 131/68, pulse (!) 58, temperature (!) 97.5 F (36.4 C), temperature source Oral, resp. rate 19, height 6' (1.829 m), weight 124 kg, SpO2 100 %. Body mass index is 37.08 kg/m. General: Well developed, well nourished, in no acute distress. Head: Normocephalic, atraumatic, sclera non-icteric, no xanthomas, nares are without discharge. Neck: No apparent masses Lungs: Normal respirations with no wheezes, no rhonchi, no rales , no crackles   Heart: Regular rate and rhythm, normal S1 S2, no murmur, no rub, no gallop, PMI is normal size and placement, carotid upstroke normal without bruit, jugular venous pressure normal Abdomen: Soft, non-tender, non-distended with normoactive bowel sounds.  No hepatosplenomegaly. Abdominal aorta is normal size without bruit Extremities: No edema, no clubbing, no cyanosis, no ulcers,  Peripheral: 2+ radial, 2+ femoral, 2+ dorsal pedal pulses Neuro: Alert and oriented. Moves all extremities spontaneously. Psych:  Responds to questions appropriately with a normal affect.   Intake/Output Summary (Last 24 hours) at 11/18/2018 0829 Last data filed at 11/17/2018 2122 Gross per 24 hour  Intake -  Output 460 ml  Net -460 ml    Inpatient Medications:  . aspirin EC  81 mg Oral Daily  . atorvastatin  80 mg Oral Daily  . carvedilol  6.25 mg Oral BID WC  . cholecalciferol  1,000 Units Oral Daily  . clopidogrel  75 mg Oral Daily  . enoxaparin (LOVENOX) injection  40 mg Subcutaneous Q24H  . furosemide  40 mg Oral BID  . gabapentin  400 mg Oral QHS  . guaiFENesin  600 mg Oral BID  . isosorbide mononitrate  30 mg Oral Daily  . ketotifen  1 drop Both Eyes BID  . Melatonin  5 mg Oral QHS  . multivitamin with minerals  1 tablet Oral Daily  . potassium chloride  20 mEq Oral BID  . primidone  200 mg Oral BID  . saccharomyces boulardii  250 mg Oral BID  . sacubitril-valsartan  1 tablet Oral BID  . sodium chloride flush  3 mL Intravenous Q12H  . spironolactone  12.5 mg Oral Daily  . tamsulosin  0.4 mg Oral Daily  . umeclidinium-vilanterol  1 puff Inhalation Daily  . vitamin C  500 mg Oral Daily   Infusions:  . sodium chloride 250 mL (11/14/18 0603)  . sodium chloride Stopped (11/17/18 1256)  . cefTRIAXone (ROCEPHIN)  IV Stopped (11/17/18 1200)    Labs: Recent Labs    11/17/18 0422 11/18/18 0543  NA 140 140  K 3.7 3.8  CL 100 99  CO2 34* 33*  GLUCOSE 95 88  BUN 25* 25*  CREATININE 0.95 0.96  CALCIUM 8.4* 8.6*   Recent Labs    11/18/18 0543  AST 33  ALT 31  ALKPHOS 108  BILITOT 0.6  PROT 6.0*  ALBUMIN 2.7*   Recent Labs    11/17/18 0422 11/18/18 0543  WBC 7.3 5.7  HGB 9.8* 9.7*  HCT 31.5* 31.6*  MCV 99.7 101.0*  PLT 193  183   No results for input(s): CKTOTAL, CKMB, TROPONINI in the last 72 hours. Invalid input(s): POCBNP No results for input(s): HGBA1C in the last 72 hours.   Weights: Filed Weights   11/15/18 0438 11/16/18 0438 11/17/18 0344  Weight: 128.6 kg 127.7 kg 124 kg     Radiology/Studies:  Dg Chest 2 View  Result Date: 11/13/2018 CLINICAL DATA:  83 year old male treated for pneumonia 1 month ago. Shortness of breath, on oxygen. EXAM: CHEST - 2 VIEW COMPARISON:  10/29/2018 and earlier. FINDINGS: Chronic cardiomegaly. Persistent pleural effusion(s) appears moderate size on the lateral. Continued abrupt termination of gas in the left mainstem bronchus and dense left lung base opacity. No superimposed pneumothorax. Pulmonary vascularity appears stable without overt edema. Paucity of bowel gas in the upper abdomen. No acute osseous abnormality identified. IMPRESSION: 1. Continued Moderate pleural effusion(s). 2. Continued dense left lower lobe collapse or consolidation. This may simply be atelectasis but pneumonia is difficult to exclude. Electronically Signed   By: Odessa Fleming M.D.   On: 11/13/2018 18:58   Dg Chest 2 View  Result Date: 10/29/2018 CLINICAL DATA:  CHF pneumonia EXAM: CHEST - 2 VIEW COMPARISON:  10/27/2018 FINDINGS: AP and lateral views of the chest are limited by positioning. Low lung volumes. The cardio pericardial silhouette is enlarged. Persistent bibasilar collapse/consolidation, left greater than right with bilateral moderate pleural effusions. Interstitial pulmonary edema pattern persists. The visualized bony structures of the thorax are intact. Telemetry leads overlie the chest. IMPRESSION: No substantial change in exam. Electronically Signed   By: Kennith Center M.D.   On: 10/29/2018 15:54   Dg Chest 2 View  Result Date: 10/27/2018 CLINICAL DATA:  Shortness of breath today. EXAM: CHEST - 2 VIEW COMPARISON:  Single-view of the chest 01/10/2018, 08/30/2018 and 09/01/2018. FINDINGS: Left  worse than right effusions and airspace disease are seen. Aeration in the right lung base appears mildly improved compared to the most recent examination. Cardiac silhouette is obscured. No pneumothorax. Aortic atherosclerosis noted. IMPRESSION: Left worse than right pleural effusions and airspace disease. Aeration in the right chest is mildly improved since the most recent examination. Atherosclerosis. Electronically Signed   By: Drusilla Kanner M.D.   On: 10/27/2018 14:48   Ct Angio Chest Pe W And/or Wo Contrast  Result Date: 11/14/2018 CLINICAL DATA:  Initial evaluation for acute shortness of breath. History of CHF, COPD. EXAM: CT ANGIOGRAPHY CHEST WITH CONTRAST TECHNIQUE: Multidetector CT imaging of the chest was performed using the standard protocol during bolus administration of intravenous contrast. Multiplanar CT image reconstructions and MIPs were obtained to evaluate the vascular anatomy. CONTRAST:  ISOVUE-370 IOPAMIDOL (ISOVUE-370) INJECTION 76% COMPARISON:  Prior radiograph from earlier the same day. FINDINGS: Cardiovascular: Mild-to-moderate atherosclerotic change noted within the intrathoracic aorta. No aneurysm. Visualized great vessels grossly unremarkable. Moderate cardiomegaly with 3 vessel coronary artery calcifications. No pericardial effusion. Pulmonary arterial tree adequately opacified for evaluation. Main pulmonary artery dilated up to 3.5 cm in diameter. No filling  defect to suggest acute pulmonary embolism. Re-formatted imaging confirms these findings. Mediastinum/Nodes: Visualized thyroid within normal limits. Scattered enlarged pre tracheal/precarinal nodes measure up to 13 mm in short axis. 11 mm subcarinal node noted. Mild soft tissue fullness at the bilateral hila without discrete adenopathy. No axillary adenopathy. Esophagus within normal limits. Lungs/Pleura: Proximal airway patent. Large left with moderate right layering pleural effusions. Associated volume  loss/atelectasis within the bilateral lower lobes, left greater than right. Dense opacity within the posterior left upper lobe also favored to reflect atelectatic changes. Superimpose hazy ground-glass opacity within the aerated portions of the lungs likely reflects mild edema. Superimposed infection would be difficult to exclude, although is less favored. No pneumothorax. Few scattered superimposed tree-in-bud nodular densities noted within the subpleural right middle lobe, which could reflect mild small airways disease and/or mucoid impaction (series 6, image 33). Upper Abdomen: Visualized upper abdomen demonstrates no acute finding. Musculoskeletal: No acute osseous abnormality. No discrete lytic or blastic osseous lesions. Multilevel degenerative spondylolysis noted within the visualized spine. Gynecomastia noted. Mild diffuse anasarca within the subcutaneous soft tissues. Review of the MIP images confirms the above findings. IMPRESSION: 1. She no CT evidence for acute pulmonary embolism. 2. Large left and moderate right layering pleural effusions with associated volume loss/atelectasis within the bilateral lower lobes. Superimposed hazy ground-glass opacity within the aerated portions of the lungs likely reflects mild edema. Superimposed infection would be difficult to exclude, although is less favored. 3. Few superimposed scattered tree-in-bud nodular densities within the subpleural right middle lobe, which may reflect mild small airways disease and/or mucoid impaction. 4. Enlarged mediastinal adenopathy as above, indeterminate, but may be reactive. 5. Cardiomegaly with 3 vessel coronary artery calcifications. Electronically Signed   By: Rise Mu M.D.   On: 11/14/2018 00:27   US Venous Img Lower Bilateral  Result Date: 11/14/2018 CLINICAL DATA:  Bilateral lower extremity edema. History of smoking. Evaluate for DVT. EXAM: BILATERAL LOWER EXTREMITY VENOUS DOPPLER ULTRASOUND TECHNIQUE: Gray-scale  sonography with graded compression, as well as color Doppler and duplex ultrasound were performed to evaluate the lower extremity deep venous systems from the level of the common femoral vein and including the common femoral, femoral, profunda femoral, popliteal and calf veins including the posterior tibial, peroneal and gastrocnemius veins when visible. The superficial great saphenous vein was also interrogated. Spectral Doppler was utilized to evaluate flow at rest and with distal augmentation maneuvers in the common femoral, femoral and popliteal veins. COMPARISON:  None. FINDINGS: RIGHT LOWER EXTREMITY Common Femoral Vein: No evidence of thrombus. Normal compressibility, respiratory phasicity and response to augmentation. Saphenofemoral Junction: No evidence of thrombus. Normal compressibility and flow on color Doppler imaging. Profunda Femoral Vein: No evidence of thrombus. Normal compressibility and flow on color Doppler imaging. Femoral Vein: No evidence of thrombus. Normal compressibility, respiratory phasicity and response to augmentation. Popliteal Vein: No evidence of thrombus. Normal compressibility, respiratory phasicity and response to augmentation. Calf Veins: No evidence of thrombus. Normal compressibility and flow on color Doppler imaging. Superficial Great Saphenous Vein: No evidence of thrombus. Normal compressibility. Venous Reflux:  None. Other Findings:  None. LEFT LOWER EXTREMITY Common Femoral Vein: No evidence of thrombus. Normal compressibility, respiratory phasicity and response to augmentation. Saphenofemoral Junction: No evidence of thrombus. Normal compressibility and flow on color Doppler imaging. Profunda Femoral Vein: No evidence of thrombus. Normal compressibility and flow on color Doppler imaging. Femoral Vein: No evidence of thrombus. Normal compressibility, respiratory phasicity and response to augmentation. Popliteal Vein: No evidence of thrombus. Normal  compressibility,  respiratory phasicity and response to augmentation. Calf Veins: No evidence of thrombus. Normal compressibility and flow on color Doppler imaging. Superficial Great Saphenous Vein: No evidence of thrombus. Normal compressibility. Venous Reflux:  None. Other Findings:  None. IMPRESSION: No evidence of DVT within either lower extremity. Electronically Signed   By: Simonne Come M.D.   On: 11/14/2018 09:10   Dg Chest Port 1 View  Result Date: 11/18/2018 CLINICAL DATA:  Follow-up pleural effusion EXAM: PORTABLE CHEST 1 VIEW COMPARISON:  Two days ago FINDINGS: Layering pleural effusions on the left more than right. Cardiomegaly and interstitial coarsening with cephalized blood flow. Limited by EKG leads and rotation. IMPRESSION: 1. Pulmonary edema and layering pleural effusions. 2. Stable from 2 days prior. Electronically Signed   By: Marnee Spring M.D.   On: 11/18/2018 06:15   Dg Chest Port 1 View  Result Date: 11/16/2018 CLINICAL DATA:  83 year old male. Pleural effusion. Subsequent encounter. EXAM: PORTABLE CHEST 1 VIEW COMPARISON:  11/13/2018 CT and chest x-ray. FINDINGS: Left greater than right pleural effusion. Pulmonary vascular congestion/pulmonary edema. Basilar consolidation may reflect atelectasis or infiltrate associated with the pleural effusions. Cardiomegaly. Calcified aorta. No obvious pneumothorax. No acute osseous abnormality noted. IMPRESSION: 1. Overall similar appearance of left greater than right pleural effusion, pulmonary edema and basilar consolidation. 2. Cardiomegaly. 3.  Aortic Atherosclerosis (ICD10-I70.0). Electronically Signed   By: Lacy Duverney M.D.   On: 11/16/2018 08:03     Assessment and Recommendation  83 y.o. male with no acute on chronic systolic dysfunction congestive heart failure coronary artery atherosclerosis chronic kidney disease anemia with edema and effusion by chest x-ray with continued shortness of breath but no evidence of acute coronary syndrome but  bacteremia with strep viridans with TEE showing lv dysfx ef 35% and NO evidence of endocarditis 1.  Continue intravenous furosemide for pulmonary edema pleural effusions and shortness of breath with acute on chronic systolic dysfunction heart failure 2.  Continue isosorbide for venous congestion as well as episodes of shortness of breath possible chest pain 3.  Carvedilol for heart rate control and LV systolic dysfunction 4.  No further cardiac diagnsotics at this time 5.  Further consideration of pulmonary toilet as well as oxygenation and CPAP machine at night for further treatment of concerns of pulmonary contribution to above symptoms    Signed, Arnoldo Hooker M.D. FACC

## 2018-11-18 NOTE — Progress Notes (Signed)
*  PRELIMINARY RESULTS* Echocardiogram Echocardiogram Transesophageal has been performed.  Joanette Gula Briar Sword 11/18/2018, 9:32 AM

## 2018-11-18 NOTE — Progress Notes (Signed)
Physical Therapy Treatment Patient Details Name: Dylan [name] MRN: [id number] DOB: 09-23-1935 Today's Date: 11/18/2018    History of Present Illness From MD H&P: Pt is an 83 y.o. male with a known history that includes L femur ORIF and LLE quadriceps repair December, 2019, CAD, CHF, MI, COPD, right drop foot, and HTN presented to the emergency room with dyspnea with associated panic attacks as well as orthopnea without worsening lower extremity edema.  He had been having occasional paroxysmal nocturnal dyspnea.  Pt's most recent 2D echo was on 06/21/2018 and it revealed an EF of 30 to 35% with first grade diastolic dysfunction, moderate right ventricular dilation and mild right atrial dilatation with mild mitral regurgitation. Labs were remarkable for mild hypokalemia of 3.3.  The patient had a portable chest x-ray that showed cardiomegaly and continued moderate pleural effusion as well as left lower lobe dense opacity, collapse versus consolidation possibly atelectasis versus pneumonia.  A chest CTA revealed no evidence for PE.  It showed bilateral pleural effusion with atelectasis in the lower lobes with groundglass densities and edema likely secondary to edema with infectious etiology less likely.  It also showed right lower lobe density that could be small airway disease and/over mucoid impaction.  Assessment includes: Acute on chronic systolic and diastolic CHF, Lower respiratory infection with possible mucoid right lung plug, hypokalemia, and HTN.    PT Comments    Pt presents with deficits in strength, transfers, mobility, gait, balance, and activity tolerance. Pt remains extremely limited with all functional mobility tasks requiring +2 total assist with bed mobility tasks, constant assist while sitting to prevent L lateral LOB, and an inability stand from sitting.  Pt was actively receiving HHPT prior to this admission and will benefit from a continued trial of skilled PT services to address the  above deficits for decreased caregiver assistance.       Follow Up Recommendations  Home health PT;Supervision for mobility/OOB     Equipment Recommendations  None recommended by PT    Recommendations for Other Services       Precautions / Restrictions Precautions Precautions: Fall Required Braces or Orthoses: Other Brace Other Brace: Per Dr. Rosita Kea pt does not need to wear his brace any longer but may continue to use it for safety purposes if he can not support his weight without buckling Restrictions Weight Bearing Restrictions: No LLE Weight Bearing: Weight bearing as tolerated Other Position/Activity Restrictions: Pt may perform L knee flex AROM but no PROM    Mobility  Bed Mobility Overal bed mobility: Needs Assistance Bed Mobility: Supine to Sit;Sit to Supine     Supine to sit: +2 for physical assistance;HOB elevated;Total assist Sit to supine: Total assist;+2 for physical assistance   General bed mobility comments: Pt total assist for all bed mobility tasks  Transfers Overall transfer level: Needs assistance               General transfer comment: Pt required constant mod A to prevent left lateral LOB in sitting; pt attempted multiple sit to/from stands from the EOB without being able to exert any notable force through his BLEs  Ambulation/Gait             General Gait Details: Unable/unsafe to attempt   Stairs             Wheelchair Mobility    Modified Rankin (Stroke Patients Only)       Balance Overall balance assessment: Needs assistance Sitting-balance support: Feet supported;Bilateral upper extremity  supported Sitting balance-Leahy Scale: Poor Sitting balance - Comments: Mod A required to prevent left lateral LOB Postural control: Left lateral lean     Standing balance comment: Unable/usafe to attempt                            Cognition Arousal/Alertness: Awake/alert Behavior During Therapy: WFL for tasks  assessed/performed Overall Cognitive Status: Within Functional Limits for tasks assessed                                        Exercises Total Joint Exercises Ankle Circles/Pumps: AROM;10 reps;Left;15 reps Quad Sets: Strengthening;Both;10 reps;5 reps Gluteal Sets: Strengthening;Both;10 reps;5 reps Heel Slides: AAROM;Both;10 reps Hip ABduction/ADduction: AAROM;Both;10 reps Straight Leg Raises: AAROM;Both;10 reps Long Arc Quad: AROM;Both;10 reps;15 reps Knee Flexion: AROM;Both;10 reps;15 reps Other Exercises Other Exercises: Right lateral weight shifting activities in sitting    General Comments        Pertinent Vitals/Pain Pain Assessment: No/denies pain    Home Living                      Prior Function            PT Goals (current goals can now be found in the care plan section) Progress towards PT goals: Not progressing toward goals - comment(Pt limited by extreme functional weakness)    Frequency    Min 2X/week      PT Plan Current plan remains appropriate    Co-evaluation              AM-PAC PT "6 Clicks" Mobility   Outcome Measure  Help needed turning from your back to your side while in a flat bed without using bedrails?: Total Help needed moving from lying on your back to sitting on the side of a flat bed without using bedrails?: Total Help needed moving to and from a bed to a chair (including a wheelchair)?: Total Help needed standing up from a chair using your arms (e.g., wheelchair or bedside chair)?: Total Help needed to walk in hospital room?: Total Help needed climbing 3-5 steps with a railing? : Total 6 Click Score: 6    End of Session Equipment Utilized During Treatment: Oxygen Activity Tolerance: Patient tolerated treatment well Patient left: in bed;with call bell/phone within reach;with bed alarm set Nurse Communication: Mobility status PT Visit Diagnosis: Muscle weakness (generalized) (M62.81);Difficulty  in walking, not elsewhere classified (R26.2);History of falling (Z91.81)     Time: 1445-1510 PT Time Calculation (min) (ACUTE ONLY): 25 min  Charges:  $Therapeutic Exercise: 8-22 mins $Therapeutic Activity: 8-22 mins                     D. Scott Neeta Storey PT, DPT 11/18/18, 3:24 PM

## 2018-11-18 NOTE — TOC Progression Note (Signed)
Transition of Care [facility]) - Progression Note    Patient Details  Name: Dylan [name] MRN: [id number] Date of Birth: 1936/07/28  [facility] CM/SW Contact  Collie Siad, RN Phone Number: 11/18/2018, 9:50 AM  Clinical Narrative:    This RNCM received call from patient's wife requesting assistance obtaining H[employer]'s lift. She has reached out to "[facility]" for assistance. This RNCM has updated Mrs. Howdeshell on [facility] transition to "[employer]" and would appreciate assistance arrangement. Brad with [employer] is working directly with Mrs. Cruise to meet this need.    Expected Discharge Plan: Home w Home Health Services Barriers to Discharge: Continued Medical Work up  Expected Discharge Plan and Services Expected Discharge Plan: Home w Home Health Services Discharge Planning Services: CM Consult Post Acute Care Choice: Durable Medical Equipment Living arrangements for the past 2 months: Single Family Home                 DME Arranged: Other see comment(Hoyer Lift) DME Agency: [facility] HH Arranged: RN, PT, Nurse's Aide, OT(ReDS Vest) HH Agency: [facility] (now [facility])   Social Determinants of Health (SDOH) Interventions    Readmission Risk Interventions 30 Day Unplanned Readmission Risk Score     ED to Hosp-Admission (Current) from 11/13/2018 in [facility]LE[facility] (2A)  30 Day Unplanned Readmission Risk Score (%)  49 Filed at 11/18/2018 0801     This score is the patient's risk of an unplanned readmission within 30 days of being discharged (0 -100%). The score is based on dignosis, age, lab data, medications, orders, and past utilization.   Low:  0-14.9   Medium: 15-21.9   High: 22-29.9   Extreme: 30 and above       Readmission Risk Prevention Plan 11/17/2018 11/17/2018 11/17/2018  Transportation Screening - - Complete  PCP or Specialist appointment within 3-5 days of discharge Complete Patient refused -  PCP/Specialist Appt Not  Complete comments - wife is nurse; Dr. Arlana Pouch agrees to sign orders without seeing patient per Jasmine December at [facility] office; wife agrees to make appt. -  HRI or Home Care Consult - (No Data) Complete  SW Recovery Care/Counseling Consult - - Complete  Palliative Care Screening - - Complete  Skilled Nursing Facility - - Not Complete  SNF Comments - - patient declined  Some recent data might be hidden

## 2018-11-18 NOTE — CV Procedure (Signed)
Transesophageal echocardiogram preliminary report  Dylan [name] [id number] [birth year]  Preliminary diagnosis  Bacteremia with possible endocarditis  Postprocedural diagnosis Moderate lv dysfunciton ef 35% without evidence of endocarditis  Time out A timeout was performed by the nursing staff and physicians specifically identifying the procedure performed, identification of the patient, the type of sedation, all allergies and medications, all pertinent medical history, and presedation assessment of nasopharynx. The patient and or family understand the risks of the procedure including the rare risks of death, stroke, heart attack, esophogeal perforation, sore throat, and reaction to medications given.  Moderate sedation During this procedure the patient has received Versed 2 milligrams and fentanyl 50 micrograms to achieve appropriate moderate sedation.  The patient had continued monitoring of heart rate, oxygenation, blood pressure, respiratory rate, and extent of signs of sedation throughout the entire procedure.  The patient received this moderate sedation over a period of 9 minutes.  Both the nursing staff and I were present during the procedure when the patient had moderate sedation for 100% of the time.  Treatment considerations  No additional treatment considerations needed for bacteremia due to no current evidence of endocarditis and possible look for other source  For further details of transesophageal echocardiogram please refer to final report.  Signed,  Lamar Blinks M.D. [facility] 11/18/2018 9:30 AM

## 2018-11-18 NOTE — Plan of Care (Signed)
  Problem: Clinical Measurements: Goal: Ability to maintain clinical measurements within normal limits will improve Outcome: Not Progressing Note:  BUN level is elevated at 25 most recently. Will continue to monitor lab values. Jari Favre [facility]

## 2018-11-18 NOTE — Consult Note (Signed)
PHARMACY CONSULT NOTE - FOLLOW UP  Pharmacy Consult for Electrolyte Monitoring and Replacement   Recent Labs: Potassium (mmol/L)  Date Value  11/18/2018 3.8  12/02/2012 3.1 (L)   Magnesium (mg/dL)  Date Value  74/82/7078 1.8   Calcium (mg/dL)  Date Value  67/54/4920 8.6 (L)   Calcium, Total (mg/dL)  Date Value  06/08/1218 8.2 (L)   Albumin (g/dL)  Date Value  75/88/3254 2.7 (L)  12/02/2012 3.3 (L)   Sodium (mmol/L)  Date Value  11/18/2018 140  12/02/2012 142   Assessment: 83 YOM with an electrolyte imbalance while aggressively diuresing. He is currently receiving Lasix 80 mg IV q12h - last dose given 3/16 at 0643. No active diuretic order at this time.  Other medications that may affect potassium levels include spironolactone 12.5mg  daily and Entrest 24/26 mg BID. Potassium level has significantly improved since adding therapy.  Plan:  3/18 -K 3.8 Scr 0.96  Patient on Lasix 40 po bid, KCL PO BID, Entresto, spironolactone. No additional replacement at this time ---electrolyte levels have been ordered and pharmacy will replace as needed  Angelique Blonder ,PharmD Clinical Pharmacist 11/18/2018 7:11 AM

## 2018-11-18 NOTE — Progress Notes (Signed)
Pulmonary Medicine          Date: 11/18/2018,   MRN# [id number] Dylan [name] [birth year]     AdmissionWeight: 125.6 kg                 CurrentWeight: 124 kg         SUBJECTIVE   Patient reports improvement clinically.  Net neg [id number] s/p TEE   PAST MEDICAL HISTORY   Past Medical History:  Diagnosis Date   CAD (coronary artery disease)    Cervicalgia    CHF (congestive heart failure) (HCC)    COPD (chronic obstructive pulmonary disease) (HCC)    Diastolic heart failure (HCC)    Foot drop, right    History of kidney stones    Hyperlipidemia    unspecified   Hypertension    Myocardial infarction [facility])    Osteoarthritis    Shoulder pain, left    Sleep apnea    Tremor, essential      SURGICAL HISTORY   Past Surgical History:  Procedure Laterality Date   APPLICATION OF WOUND VAC Right 11/13/2017   Procedure: APPLICATION OF WOUND VAC;  Surgeon: Annice Needy, MD;  Location: [facility] ORS;  Service: Vascular;  Laterality: Right;   BACK SURGERY  06/2010   CARDIAC CATHETERIZATION Left 04/30/2016   Procedure: Left Heart Cath and Coronary Angiography;  Surgeon: Laurier Nancy, MD;  Location: [facility] INVASIVE CV LAB;  Service: Cardiovascular;  Laterality: Left;   COLONOSCOPY     CORONARY ANGIOPLASTY     CORONARY/GRAFT ACUTE MI REVASCULARIZATION N/A 06/21/2018   Procedure: Coronary/Graft Acute MI Revascularization;  Surgeon: Iran Ouch, MD;  Location: [facility] INVASIVE CV LAB;  Service: Cardiovascular;  Laterality: N/A;   KNEE ARTHROPLASTY Left 06/01/2018   Procedure: COMPUTER ASSISTED TOTAL KNEE ARTHROPLASTY;  Surgeon: Donato Heinz, MD;  Location: [facility] ORS;  Service: Orthopedics;  Laterality: Left;   KNEE ARTHROSCOPY Left    KNEE SURGERY Left 1998   LEFT HEART CATH AND CORONARY ANGIOGRAPHY N/A 06/21/2018   Procedure: LEFT HEART CATH AND CORONARY ANGIOGRAPHY;  Surgeon: Iran Ouch, MD;  Location: [facility] INVASIVE CV LAB;  Service:  Cardiovascular;  Laterality: N/A;   ORIF FEMUR FRACTURE Left 08/27/2018   Procedure: OPEN REDUCTION INTERNAL FIXATION (ORIF) SUPRACONDYLAR FEMUR FRACTURE ABOVE PROSTHESIS, QUADRICEPS REPAIR;  Surgeon: Kennedy Bucker, MD;  Location: [facility] ORS;  Service: Orthopedics;  Laterality: Left;   TONSILLECTOMY     WOUND DEBRIDEMENT Right 11/13/2017   Procedure: DEBRIDEMENT WOUND;  Surgeon: Annice Needy, MD;  Location: [facility] ORS;  Service: Vascular;  Laterality: Right;     FAMILY HISTORY   Family History  Problem Relation Age of Onset   Diabetes Son    Cancer Mother    Colon cancer Mother    Lung cancer Mother    Tremor Father    Heart disease Father    Tremor Brother    Bladder Cancer Brother    Tremor Sister      SOCIAL HISTORY   Social History   Tobacco Use   Smoking status: Former Smoker    Packs/day: 0.25    Years: 58.00    Pack years: 14.50    Types: Cigarettes   Smokeless tobacco: Former Neurosurgeon   Tobacco comment: quit the begginning of septembet 2019  Substance Use Topics   Alcohol use: No    Alcohol/week: 0.0 standard drinks   Drug use: No     MEDICATIONS    Home Medication:  Current Medication:  Current Facility-Administered Medications:    0.9 %  sodium chloride infusion, 250 mL, Intravenous, PRN, Mansy, Jan A, MD, Last Rate: 10 mL/hr at 11/14/18 0603, 250 mL at 11/14/18 0603   0.9 %  sodium chloride infusion, , Intravenous, PRN, Delfino Lovett, MD, Stopped at 11/17/18 1256   0.9 %  sodium chloride infusion, , Intravenous, Continuous, Lamar Blinks, MD, Stopped at 11/18/18 1027   acetaminophen (TYLENOL) tablet 650 mg, 650 mg, Oral, Q4H PRN, Mansy, Jan A, MD, 650 mg at 11/17/18 2249   alum & mag hydroxide-simeth (MAALOX/MYLANTA) 200-200-20 MG/5ML suspension 30 mL, 30 mL, Oral, Q4H PRN, Mansy, Jan A, MD   aspirin EC tablet 81 mg, 81 mg, Oral, Daily, Mansy, Jan A, MD, 81 mg at 11/18/18 1044   atorvastatin (LIPITOR) tablet 80 mg, 80 mg, Oral,  Daily, Mansy, Jan A, MD, 80 mg at 11/17/18 1743   benzonatate (TESSALON) capsule 200 mg, 200 mg, Oral, TID PRN, Mansy, Jan A, MD   bisacodyl (DULCOLAX) suppository 10 mg, 10 mg, Rectal, PRN, Mansy, Jan A, MD   carvedilol (COREG) tablet 6.25 mg, 6.25 mg, Oral, BID WC, Mansy, Jan A, MD, 6.25 mg at 11/17/18 1743   cefTRIAXone (ROCEPHIN) 2 g in sodium chloride 0.9 % 100 mL IVPB, 2 g, Intravenous, Q24H, Sherryll Burger, Vipul, MD, Last Rate: 200 mL/hr at 11/18/18 1122, 2 g at 11/18/18 1122   cholecalciferol (VITAMIN D) tablet 1,000 Units, 1,000 Units, Oral, Daily, Mansy, Jan A, MD, 1,000 Units at 11/18/18 1044   clopidogrel (PLAVIX) tablet 75 mg, 75 mg, Oral, Daily, Mansy, Jan A, MD, 75 mg at 11/18/18 1044   docusate sodium (COLACE) capsule 100 mg, 100 mg, Oral, Daily PRN, Lule, Joana, PA   enoxaparin (LOVENOX) injection 40 mg, 40 mg, Subcutaneous, Q24H, Marty Heck, RPH, 40 mg at 11/18/18 1610   fentaNYL (SUBLIMAZE) 100 MCG/2ML injection, , , ,    furosemide (LASIX) tablet 40 mg, 40 mg, Oral, BID, Karna Christmas, Gay Rape, MD, 40 mg at 11/17/18 1743   gabapentin (NEURONTIN) capsule 400 mg, 400 mg, Oral, QHS, Mansy, Jan A, MD, 400 mg at 11/17/18 2249   guaiFENesin (MUCINEX) 12 hr tablet 600 mg, 600 mg, Oral, BID, Mansy, Jan A, MD, 600 mg at 11/17/18 2249   isosorbide mononitrate (IMDUR) 24 hr tablet 30 mg, 30 mg, Oral, Daily, Mansy, Jan A, MD, 30 mg at 11/18/18 1044   ketotifen (ZADITOR) 0.025 % ophthalmic solution 1 drop, 1 drop, Both Eyes, BID, Mansy, Jan A, MD, 1 drop at 11/17/18 2248   loratadine (CLARITIN) tablet 10 mg, 10 mg, Oral, Daily PRN, Mansy, Jan A, MD   magnesium hydroxide (MILK OF MAGNESIA) suspension 30 mL, 30 mL, Oral, Q4H PRN, Mansy, Jan A, MD   Melatonin TABS 5 mg, 5 mg, Oral, QHS, Mansy, Jan A, MD, 5 mg at 11/17/18 2249   midazolam (VERSED) 5 MG/5ML injection, , , ,    multivitamin with minerals tablet 1 tablet, 1 tablet, Oral, Daily, Mansy, Jan A, MD, 1 tablet at 11/18/18 1045    nitroGLYCERIN (NITROSTAT) SL tablet 0.4 mg, 0.4 mg, Sublingual, Q5 min PRN, Mansy, Jan A, MD   ondansetron [facility]) injection 4 mg, 4 mg, Intravenous, Q6H PRN, Mansy, Jan A, MD   polyethylene glycol (MIRALAX / GLYCOLAX) packet 17 g, 17 g, Oral, BID PRN, Mansy, Jan A, MD   polyvinyl alcohol (LIQUIFILM TEARS) 1.4 % ophthalmic solution 2 drop, 2 drop, Both Eyes, PRN, Delfino Lovett, MD, 2 drop at 11/16/18  1610   potassium chloride SA (K-DUR,KLOR-CON) CR tablet 20 mEq, 20 mEq, Oral, BID, Karna Christmas, Rebecka Oelkers, MD, 20 mEq at 11/17/18 2249   primidone (MYSOLINE) tablet 200 mg, 200 mg, Oral, BID, Mansy, Jan A, MD, 200 mg at 11/17/18 2248   saccharomyces boulardii (FLORASTOR) capsule 250 mg, 250 mg, Oral, BID, Mansy, Jan A, MD, 250 mg at 11/17/18 2249   sacubitril-valsartan (ENTRESTO) 24-26 mg per tablet, 1 tablet, Oral, BID, Mansy, Jan A, MD, 1 tablet at 11/17/18 2249   senna-docusate (Senokot-S) tablet 2 tablet, 2 tablet, Oral, BID PRN, Mansy, Jan A, MD, 2 tablet at 11/17/18 1255   senna-docusate (Senokot-S) tablet 2 tablet, 2 tablet, Oral, BID, Sherryll Burger, Vipul, MD   sodium chloride flush (NS) 0.9 % injection 3 mL, 3 mL, Intravenous, Q12H, Mansy, Jan A, MD, 3 mL at 11/17/18 2253   sodium chloride flush (NS) 0.9 % injection 3 mL, 3 mL, Intravenous, PRN, Mansy, Jan A, MD, 3 mL at 11/15/18 0505   sodium chloride flush 0.9 % injection, , , ,    spironolactone (ALDACTONE) tablet 12.5 mg, 12.5 mg, Oral, Daily, Mansy, Jan A, MD, 12.5 mg at 11/18/18 1045   tamsulosin (FLOMAX) capsule 0.4 mg, 0.4 mg, Oral, Daily, Mansy, Jan A, MD, 0.4 mg at 11/18/18 1046   umeclidinium-vilanterol (ANORO ELLIPTA) 62.5-25 MCG/INH 1 puff, 1 puff, Inhalation, Daily, Sherryll Burger, Vipul, MD, 1 puff at 11/18/18 1047   vitamin C (ASCORBIC ACID) tablet 500 mg, 500 mg, Oral, Daily, Mansy, Jan A, MD, 500 mg at 11/18/18 1046   zolpidem (AMBIEN) tablet 5 mg, 5 mg, Oral, QHS PRN, Mansy, Vernetta Honey, MD    ALLERGIES   Patient has no known  allergies.     REVIEW OF SYSTEMS    Review of Systems:  Gen:  Denies  fever, sweats, chills weigh loss  HEENT: Denies blurred vision, double vision, ear pain, eye pain, hearing loss, nose bleeds, sore throat Cardiac:  No dizziness, chest pain or heaviness, chest tightness,edema Resp:   Denies cough or sputum porduction, shortness of breath,wheezing, hemoptysis,  Gi: Denies swallowing difficulty, stomach pain, nausea or vomiting, diarrhea, constipation, bowel incontinence Gu:  Denies bladder incontinence, burning urine Ext:   Denies Joint pain, stiffness or swelling Skin: Denies  skin rash, easy bruising or bleeding or hives Endoc:  Denies polyuria, polydipsia , polyphagia or weight change Psych:   Denies depression, insomnia or hallucinations   Other:  All other systems negative   VS: BP (!) 115/59    Pulse 61    Temp 36.5 C (Oral)    Resp (!) 24    Ht 6' (1.829 m)    Wt 124 kg    SpO2 94%    BMI 37.08 kg/m      PHYSICAL EXAM    GENERAL:NAD, no fevers, chills, no weakness no fatigue HEAD: Normocephalic, atraumatic.  EYES: Pupils equal, round, reactive to light. Extraocular muscles intact. No scleral icterus.  MOUTH: Moist mucosal membrane. Dentition intact. No abscess noted.  EAR, NOSE, THROAT: Clear without exudates. No external lesions.  NECK: Supple. No thyromegaly. No nodules. No JVD.  PULMONARY: decreased breath sounds b/l CARDIOVASCULAR: S1 and S2. Regular rate and rhythm. No murmurs, rubs, or gallops. No edema. Pedal pulses 2+ bilaterally.  GASTROINTESTINAL: Soft, nontender, nondistended. No masses. Positive bowel sounds. No hepatosplenomegaly.  MUSCULOSKELETAL: No swelling, clubbing, or edema. Range of motion full in all extremities.  NEUROLOGIC: Cranial nerves II through XII are intact. No gross focal neurological deficits. Sensation intact. Reflexes  intact.  SKIN: No ulceration, lesions, rashes, or cyanosis. Skin warm and dry. Turgor intact.  PSYCHIATRIC: Mood,  affect within normal limits. The patient is awake, alert and oriented x 3. Insight, judgment intact.       IMAGING    Dg Chest 2 View  Result Date: 11/13/2018 CLINICAL DATA:  83 year old male treated for pneumonia 1 month ago. Shortness of breath, on oxygen. EXAM: CHEST - 2 VIEW COMPARISON:  10/29/2018 and earlier. FINDINGS: Chronic cardiomegaly. Persistent pleural effusion(s) appears moderate size on the lateral. Continued abrupt termination of gas in the left mainstem bronchus and dense left lung base opacity. No superimposed pneumothorax. Pulmonary vascularity appears stable without overt edema. Paucity of bowel gas in the upper abdomen. No acute osseous abnormality identified. IMPRESSION: 1. Continued Moderate pleural effusion(s). 2. Continued dense left lower lobe collapse or consolidation. This may simply be atelectasis but pneumonia is difficult to exclude. Electronically Signed   By: Odessa Fleming M.D.   On: 11/13/2018 18:58   Dg Chest 2 View  Result Date: 10/29/2018 CLINICAL DATA:  CHF pneumonia EXAM: CHEST - 2 VIEW COMPARISON:  10/27/2018 FINDINGS: AP and lateral views of the chest are limited by positioning. Low lung volumes. The cardio pericardial silhouette is enlarged. Persistent bibasilar collapse/consolidation, left greater than right with bilateral moderate pleural effusions. Interstitial pulmonary edema pattern persists. The visualized bony structures of the thorax are intact. Telemetry leads overlie the chest. IMPRESSION: No substantial change in exam. Electronically Signed   By: Kennith Center M.D.   On: 10/29/2018 15:54   Dg Chest 2 View  Result Date: 10/27/2018 CLINICAL DATA:  Shortness of breath today. EXAM: CHEST - 2 VIEW COMPARISON:  Single-view of the chest 01/10/2018, 08/30/2018 and 09/01/2018. FINDINGS: Left worse than right effusions and airspace disease are seen. Aeration in the right lung base appears mildly improved compared to the most recent examination. Cardiac  silhouette is obscured. No pneumothorax. Aortic atherosclerosis noted. IMPRESSION: Left worse than right pleural effusions and airspace disease. Aeration in the right chest is mildly improved since the most recent examination. Atherosclerosis. Electronically Signed   By: Drusilla Kanner M.D.   On: 10/27/2018 14:48   Ct Angio Chest Pe W And/or Wo Contrast  Result Date: 11/14/2018 CLINICAL DATA:  Initial evaluation for acute shortness of breath. History of CHF, COPD. EXAM: CT ANGIOGRAPHY CHEST WITH CONTRAST TECHNIQUE: Multidetector CT imaging of the chest was performed using the standard protocol during bolus administration of intravenous contrast. Multiplanar CT image reconstructions and MIPs were obtained to evaluate the vascular anatomy. CONTRAST:  ISOVUE-370 IOPAMIDOL (ISOVUE-370) INJECTION 76% COMPARISON:  Prior radiograph from earlier the same day. FINDINGS: Cardiovascular: Mild-to-moderate atherosclerotic change noted within the intrathoracic aorta. No aneurysm. Visualized great vessels grossly unremarkable. Moderate cardiomegaly with 3 vessel coronary artery calcifications. No pericardial effusion. Pulmonary arterial tree adequately opacified for evaluation. Main pulmonary artery dilated up to 3.5 cm in diameter. No filling defect to suggest acute pulmonary embolism. Re-formatted imaging confirms these findings. Mediastinum/Nodes: Visualized thyroid within normal limits. Scattered enlarged pre tracheal/precarinal nodes measure up to 13 mm in short axis. 11 mm subcarinal node noted. Mild soft tissue fullness at the bilateral hila without discrete adenopathy. No axillary adenopathy. Esophagus within normal limits. Lungs/Pleura: Proximal airway patent. Large left with moderate right layering pleural effusions. Associated volume loss/atelectasis within the bilateral lower lobes, left greater than right. Dense opacity within the posterior left upper lobe also favored to reflect atelectatic changes.  Superimpose hazy ground-glass opacity within the aerated  portions of the lungs likely reflects mild edema. Superimposed infection would be difficult to exclude, although is less favored. No pneumothorax. Few scattered superimposed tree-in-bud nodular densities noted within the subpleural right middle lobe, which could reflect mild small airways disease and/or mucoid impaction (series 6, image 33). Upper Abdomen: Visualized upper abdomen demonstrates no acute finding. Musculoskeletal: No acute osseous abnormality. No discrete lytic or blastic osseous lesions. Multilevel degenerative spondylolysis noted within the visualized spine. Gynecomastia noted. Mild diffuse anasarca within the subcutaneous soft tissues. Review of the MIP images confirms the above findings. IMPRESSION: 1. She no CT evidence for acute pulmonary embolism. 2. Large left and moderate right layering pleural effusions with associated volume loss/atelectasis within the bilateral lower lobes. Superimposed hazy ground-glass opacity within the aerated portions of the lungs likely reflects mild edema. Superimposed infection would be difficult to exclude, although is less favored. 3. Few superimposed scattered tree-in-bud nodular densities within the subpleural right middle lobe, which may reflect mild small airways disease and/or mucoid impaction. 4. Enlarged mediastinal adenopathy as above, indeterminate, but may be reactive. 5. Cardiomegaly with 3 vessel coronary artery calcifications. Electronically Signed   By: Rise Mu M.D.   On: 11/14/2018 00:27   US Venous Img Lower Bilateral  Result Date: 11/14/2018 CLINICAL DATA:  Bilateral lower extremity edema. History of smoking. Evaluate for DVT. EXAM: BILATERAL LOWER EXTREMITY VENOUS DOPPLER ULTRASOUND TECHNIQUE: Gray-scale sonography with graded compression, as well as color Doppler and duplex ultrasound were performed to evaluate the lower extremity deep venous systems from the level of the  common femoral vein and including the common femoral, femoral, profunda femoral, popliteal and calf veins including the posterior tibial, peroneal and gastrocnemius veins when visible. The superficial great saphenous vein was also interrogated. Spectral Doppler was utilized to evaluate flow at rest and with distal augmentation maneuvers in the common femoral, femoral and popliteal veins. COMPARISON:  None. FINDINGS: RIGHT LOWER EXTREMITY Common Femoral Vein: No evidence of thrombus. Normal compressibility, respiratory phasicity and response to augmentation. Saphenofemoral Junction: No evidence of thrombus. Normal compressibility and flow on color Doppler imaging. Profunda Femoral Vein: No evidence of thrombus. Normal compressibility and flow on color Doppler imaging. Femoral Vein: No evidence of thrombus. Normal compressibility, respiratory phasicity and response to augmentation. Popliteal Vein: No evidence of thrombus. Normal compressibility, respiratory phasicity and response to augmentation. Calf Veins: No evidence of thrombus. Normal compressibility and flow on color Doppler imaging. Superficial Great Saphenous Vein: No evidence of thrombus. Normal compressibility. Venous Reflux:  None. Other Findings:  None. LEFT LOWER EXTREMITY Common Femoral Vein: No evidence of thrombus. Normal compressibility, respiratory phasicity and response to augmentation. Saphenofemoral Junction: No evidence of thrombus. Normal compressibility and flow on color Doppler imaging. Profunda Femoral Vein: No evidence of thrombus. Normal compressibility and flow on color Doppler imaging. Femoral Vein: No evidence of thrombus. Normal compressibility, respiratory phasicity and response to augmentation. Popliteal Vein: No evidence of thrombus. Normal compressibility, respiratory phasicity and response to augmentation. Calf Veins: No evidence of thrombus. Normal compressibility and flow on color Doppler imaging. Superficial Great Saphenous  Vein: No evidence of thrombus. Normal compressibility. Venous Reflux:  None. Other Findings:  None. IMPRESSION: No evidence of DVT within either lower extremity. Electronically Signed   By: Simonne Come M.D.   On: 11/14/2018 09:10   Dg Chest Port 1 View  Result Date: 11/18/2018 CLINICAL DATA:  Follow-up pleural effusion EXAM: PORTABLE CHEST 1 VIEW COMPARISON:  Two days ago FINDINGS: Layering pleural effusions on the left more than  right. Cardiomegaly and interstitial coarsening with cephalized blood flow. Limited by EKG leads and rotation. IMPRESSION: 1. Pulmonary edema and layering pleural effusions. 2. Stable from 2 days prior. Electronically Signed   By: Marnee Spring M.D.   On: 11/18/2018 06:15   Dg Chest Port 1 View  Result Date: 11/16/2018 CLINICAL DATA:  83 year old male. Pleural effusion. Subsequent encounter. EXAM: PORTABLE CHEST 1 VIEW COMPARISON:  11/13/2018 CT and chest x-ray. FINDINGS: Left greater than right pleural effusion. Pulmonary vascular congestion/pulmonary edema. Basilar consolidation may reflect atelectasis or infiltrate associated with the pleural effusions. Cardiomegaly. Calcified aorta. No obvious pneumothorax. No acute osseous abnormality noted. IMPRESSION: 1. Overall similar appearance of left greater than right pleural effusion, pulmonary edema and basilar consolidation. 2. Cardiomegaly. 3.  Aortic Atherosclerosis (ICD10-I70.0). Electronically Signed   By: Lacy Duverney M.D.   On: 11/16/2018 08:03      ASSESSMENT/PLAN    Acute hypoxic respiratory failure - improved - currently on 2Lnc - please wean down to goal SpO2 90-94 -due to acute onset of systolic CHF with EF of 30 to 16%- probably secondary to bacteremia-rocephin iv -decreasing Lasix to  daily. CXR improved right sided Pleural effusion. - Foley catheter to be placed today, patient is agreeable, was unable to keep exteral condom catheter held.Decreased p.o. fluid intake to maximum1200cc/24h.    -net -5217L -PharmD for electrlytes - appreciate repletion -Cardiology on case - Dr Rollen Sox - s/p TEE    COPD -continueduo-nebs. No need for steroids patient is not in exacerbation. Will optimize inhalers on outpatient basis   OSA -continueV 60BiPAP with 1 L O2 bleed in with goal O2 sats 90 to 94% -auto titrated as per RT -beard trimmed for proper seal with interface      Thank you for allowing me to participate in the care of this patient.    Patient/Family are satisfied with care plan and all questions have been answered.  This document was prepared using Dragon voice recognition software and may include unintentional dictation errors.     Vida Rigger, M.D.  Division of Pulmonary & Critical Care Medicine  [facility]

## 2018-11-18 NOTE — Progress Notes (Signed)
Date of Admission:  11/13/2018      ID: Dylan [name] is a 83 y.o. male  Active Problems:   Acute CHF (congestive heart failure) (HCC)   Goals of care, counseling/discussion   Palliative care by specialist   DNR (do not resuscitate) discussion  Strepviridans and Rothia mucilaginosa in 1 set of Blood culture- other set negative TEE neg No source Repeat culture from 3/16 neg So this is likely contaminant   Subjective: Feeling better Less sob Cough less Never had fever  Medications:  . aspirin EC  81 mg Oral Daily  . atorvastatin  80 mg Oral Daily  . carvedilol  6.25 mg Oral BID WC  . cholecalciferol  1,000 Units Oral Daily  . clopidogrel  75 mg Oral Daily  . enoxaparin (LOVENOX) injection  40 mg Subcutaneous Q24H  . fentaNYL      . furosemide  40 mg Oral BID  . gabapentin  400 mg Oral QHS  . guaiFENesin  600 mg Oral BID  . isosorbide mononitrate  30 mg Oral Daily  . ketotifen  1 drop Both Eyes BID  . Melatonin  5 mg Oral QHS  . midazolam      . multivitamin with minerals  1 tablet Oral Daily  . potassium chloride  20 mEq Oral BID  . primidone  200 mg Oral BID  . saccharomyces boulardii  250 mg Oral BID  . sacubitril-valsartan  1 tablet Oral BID  . senna-docusate  2 tablet Oral BID  . sodium chloride flush  3 mL Intravenous Q12H  . sodium chloride flush      . spironolactone  12.5 mg Oral Daily  . tamsulosin  0.4 mg Oral Daily  . umeclidinium-vilanterol  1 puff Inhalation Daily  . vitamin C  500 mg Oral Daily    Objective: Vital signs in last 24 hours: Temp:  [97.5 F (36.4 C)-99.5 F (37.5 C)] 98.1 F (36.7 C) (03/18 1540) Pulse Rate:  [57-69] 60 (03/18 1540) Resp:  [9-28] 19 (03/18 1540) BP: (113-143)/(42-85) 141/69 (03/18 1540) SpO2:  [94 %-100 %] 100 % (03/18 1540)  PHYSICAL EXAM:  General: Alert, cooperative, no distress, appears stated age.  Unable to listen to heart or chest because of resp vest Lab Results Recent Labs    11/17/18 0422  11/18/18 0543  WBC 7.3 5.7  HGB 9.8* 9.7*  HCT 31.5* 31.6*  NA 140 140  K 3.7 3.8  CL 100 99  CO2 34* 33*  BUN 25* 25*  CREATININE 0.95 0.96   Liver Panel Recent Labs    11/18/18 0543  PROT 6.0*  ALBUMIN 2.7*  AST 33  ALT 31  ALKPHOS 108  BILITOT 0.6   Sedimentation Rate No results for input(s): ESRSEDRATE in the last 72 hours. C-Reactive Protein No results for input(s): CRP in the last 72 hours.  Microbiology:  Studies/Results: Dg Chest Port 1 View  Result Date: 11/18/2018 CLINICAL DATA:  Follow-up pleural effusion EXAM: PORTABLE CHEST 1 VIEW COMPARISON:  Two days ago FINDINGS: Layering pleural effusions on the left more than right. Cardiomegaly and interstitial coarsening with cephalized blood flow. Limited by EKG leads and rotation. IMPRESSION: 1. Pulmonary edema and layering pleural effusions. 2. Stable from 2 days prior. Electronically Signed   By: Marnee Spring M.D.   On: 11/18/2018 06:15      Assessment/Plan: 83 y.o.male  with a history of morbid obesity, chronic systolic CHF, hyperlipidemia, status post total left knee replacement, periprosthetic fracture around  prosthetic knee, coronary artery disease Presents to the ED from home through EMS with shortness of breath.    ?Acute hypoxic resp failure- due to CHF. EF 30-35 % in Oct 2019 B/l pleural effusion Basilar atelectasis Strep viridans and rothia in 1 set of blood cultures-No vegetation on the valves by TEE- repeat blood cultue neg- so likely these are contaminants and he does not need antibiotic after tomorrow  ? _H/o Left TKA sept 2019, followed by fall in [facility] 2019 and fracture of periprosthetic femur and ORIF- wound healed well  Discussed the management with patient and his wife and Dr.Shah ID will sign off- call if needed

## 2018-11-18 NOTE — TOC Progression Note (Signed)
Transition of Care [facility]) - Progression Note    Patient Details  Name: Dylan [name] MRN: [id number] Date of Birth: 26-Oct-1935  Transition of Care [facility]) CM/SW Contact  Dylan Siad, RN Phone Number: 11/18/2018, 4:19 PM  Clinical Narrative:     RNCM spoke with Dylan [name]. She has not heard back from [place] with [employer] about Dylan [name]. Dylan [name] requests that there be no delay in delivery of Dylan [name] even if she has to pay out of pocket ($150/month rental fee).  Dylan [name] with [employer] still has not heard back from his leaders (started at 0900 today). Dylan [name] said he would reach out to her when he finds out anything about lift being covered. She will call this RNCM in the AM to update.    Expected Discharge Plan: Home w Home Health Services Barriers to Discharge: Continued Medical Work up  Expected Discharge Plan and Services Expected Discharge Plan: Home w Home Health Services Discharge Planning Services: CM Consult Post Acute Care Choice: Durable Medical Equipment Living arrangements for the past 2 months: Single Family Home                 DME Arranged: Other see comment(Hoyer Lift) DME Agency: [employer] HH Arranged: RN, PT, Nurse's Aide, OT(ReDS Vest) HH Agency: [facility] (now [facility])   Social Determinants of Health (SDOH) Interventions    Readmission Risk Interventions 30 Day Unplanned Readmission Risk Score     ED to Hosp-Admission (Current) from 11/13/2018 in [facility] (2A)  30 Day Unplanned Readmission Risk Score (%)  51 Filed at 11/18/2018 1600     This score is the patient's risk of an unplanned readmission within 30 days of being discharged (0 -100%). The score is based on dignosis, age, lab data, medications, orders, and past utilization.   Low:  0-14.9   Medium: 15-21.9   High: 22-29.9   Extreme: 30 and above       Readmission Risk Prevention Plan 11/17/2018 11/17/2018 11/17/2018  Transportation Screening - - Complete   PCP or Specialist appointment within 3-5 days of discharge Complete Patient refused -  PCP/Specialist Appt Not Complete comments - wife is nurse; Dylan [name] agrees to sign orders without seeing patient per Dylan [name] at [facility] office; wife agrees to make appt. -  HRI or Home Care Consult - (No Data) Complete  SW Recovery Care/Counseling Consult - - Complete  Palliative Care Screening - - Complete  Skilled Nursing Facility - - Not Complete  SNF Comments - - patient declined  Some recent data might be hidden

## 2018-11-18 NOTE — TOC Progression Note (Addendum)
Transition of Care [facility]) - Progression Note    Patient Details  Name: Dylan [name] MRN: [id number] Date of Birth: [birth year]  Transition of Care [facility]) CM/SW Contact  Eber Hong, RN Phone Number: 11/18/2018, 5:19 PM  Clinical Narrative:     TEE negative for vegetation. At present, there is no  indication of need for home IV antibiotics   Expected Discharge Plan: Home w Home Health Services Barriers to Discharge: Continued Medical Work up  Expected Discharge Plan and Services Expected Discharge Plan: Home w Home Health Services Discharge Planning Services: CM Consult Post Acute Care Choice: Durable Medical Equipment Living arrangements for the past 2 months: Single Family Home                 DME Arranged: Other see comment(Hoyer Lift) DME Agency: [facility] HH Arranged: RN, PT, Nurse's Aide, OT(ReDS Vest) HH Agency: [facility] (now [facility])   Social Determinants of Health (SDOH) Interventions    Readmission Risk Interventions 30 Day Unplanned Readmission Risk Score     ED to Hosp-Admission (Current) from 11/13/2018 in [facility] (2A)  30 Day Unplanned Readmission Risk Score (%)  51 Filed at 11/18/2018 1600     This score is the patient's risk of an unplanned readmission within 30 days of being discharged (0 -100%). The score is based on dignosis, age, lab data, medications, orders, and past utilization.   Low:  0-14.9   Medium: 15-21.9   High: 22-29.9   Extreme: 30 and above       Readmission Risk Prevention Plan 11/17/2018 11/17/2018 11/17/2018  Transportation Screening - - Complete  PCP or Specialist appointment within 3-5 days of discharge Complete Patient refused -  PCP/Specialist Appt Not Complete comments - wife is nurse; Dr. Arlana Pouch agrees to sign orders without seeing patient per Jasmine December at [facility] office; wife agrees to make appt. -  HRI or Home Care Consult - (No Data) Complete  SW Recovery Care/Counseling Consult  - - Complete  Palliative Care Screening - - Complete  Skilled Nursing Facility - - Not Complete  SNF Comments - - patient declined  Some recent data might be hidden

## 2018-11-18 NOTE — Progress Notes (Signed)
Attempted to call both ID prevention numbers to discuss this patient's contact isolation order. No answer for either number. Dylan [name] [facility]

## 2018-11-18 NOTE — Progress Notes (Signed)
[employer] - [place] at [facility]   PATIENT NAME: Dylan [name]    MR#:  [id number]  DATE OF BIRTH:  1936-01-07  SUBJECTIVE:  CHIEF COMPLAINT:   Chief Complaint  Patient presents with  . Shortness of Breath   In bed watching TV, intermittent SOB unchanged 2L O2 [place]. 4 days since last BM. States this is normal for him. + Weakness, chronic. Requests a session with PT.   I called wife to update, plan for discharge tomorrow will update her in AM.  REVIEW OF SYSTEMS:  Review of Systems  Constitutional:+ for fatigue.Negative fordiaphoresis,fever,malaise,and weight loss.  HENT: Negative forear discharge,ear pain,hearing loss,nosebleeds,sore throatand tinnitus.  Eyes: Negative forblurred visionand pain.  Respiratory:Negativeforshortness of breath. Negative forcough,hemoptysisand wheezing.  Cardiovascular: Negative forchest pain,palpitations,orthopneaand leg swelling.  Gastrointestinal:Positive for constipation.Negative forabdominal pain,blood in stool,diarrhea,heartburn,nauseaand vomiting.  Genitourinary: Negative fordysuria,frequencyand urgency.  Musculoskeletal: Negative forback painand myalgias.  Skin: Negative foritchingand rash. + skin tears R shin, L great toe. Neurological: + for weakness. Negative fordizziness,tingling,tremors,focal weakness,seizures,and headaches.  Psychiatric/Behavioral: Negative fordepression. The patientis not nervous/anxious. DRUG ALLERGIES:  No Known Allergies VITALS:  Blood pressure 128/61, pulse 62, temperature 98.5 F (36.9 C), temperature source Oral, resp. rate 19, height 6' (1.829 m), weight 124 kg, SpO2 96 %. PHYSICAL EXAMINATION:  Physical Exam Constitutional:  Appearance: He is morbidly obese. He isill-appearing.  HENT:  Head: Normocephalicand atraumatic.  Eyes:  Conjunctiva/sclera: Conjunctivae normal.  Pupils: Pupils are equal, round, and reactive to light.   Neck:  Musculoskeletal: Normal range of motionand neck supple.  Thyroid: No thyromegaly.  Trachea: No tracheal deviation.  Cardiovascular:  Rate and Rhythm: Normal rateand regular rhythm.  Heart sounds: Normal heart sounds.  Pulmonary:  Effort: Pulmonary effort is normal. Norespiratory distress.  Breath sounds:Decreased breath sounds. No crackles noted today.Nowheezing.  Chest:  Chest wall: No tenderness.  Abdominal:  General: Bowel sounds are normal. There is distension and some dependent flank edema.  Palpations: Abdomen is soft.  Tenderness: There is no abdominal tenderness.  Musculoskeletal:Normal range of motion. Generalized weakness. Skin: General: Skin is warmand dry.  Findings: R shin dressing intact. L great toe dressing intact (skin tears).  Neurological:  Mental Status: He is alertand oriented to person, place, and time.  Cranial Nerves: No cranial nerve deficit. He has a catheter. LABORATORY PANEL:  Male CBC Recent Labs  Lab 11/18/18 0543  WBC 5.7  HGB 9.7*  HCT 31.6*  PLT 183   ------------------------------------------------------------------------------------------------------------------ Chemistries  Recent Labs  Lab 11/15/18 0533  11/18/18 0543  NA 142   < > 140  K 4.1   < > 3.8  CL 102   < > 99  CO2 32   < > 33*  GLUCOSE 104*   < > 88  BUN 30*   < > 25*  CREATININE 1.13   < > 0.96  CALCIUM 8.4*   < > 8.6*  MG 1.8  --   --   AST  --   --  33  ALT  --   --  31  ALKPHOS  --   --  108  BILITOT  --   --  0.6   < > = values in this interval not displayed.   I/O last 3 completed shifts: In: -  Out: 460 [Urine:460] No intake/output data recorded.   Filed Weights   11/15/18 0438 11/16/18 0438 11/17/18 0344  Weight: 128.6 kg 127.7 kg 124 kg    RADIOLOGY:  Dg Chest [facility]  Result Date: 11/18/2018 CLINICAL DATA:  Follow-up pleural effusion EXAM: PORTABLE CHEST 1 VIEW COMPARISON:   Two days ago FINDINGS: Layering pleural effusions on the left more than right. Cardiomegaly and interstitial coarsening with cephalized blood flow. Limited by EKG leads and rotation. IMPRESSION: 1. Pulmonary edema and layering pleural effusions. 2. Stable from 2 days prior. Electronically Signed   By: Marnee Spring M.D.   On: 11/18/2018 06:15   ASSESSMENT AND PLAN:   Vernell Morgans a83 y.o.malewith a history of chronic systolic congestive heart failure, CAD, CKD, anemia, who presented to the emergency room with dyspnea with associated panic attacks as well as orthopnea without worsening lower extremity edema. His most recent prior 2D echo was on 06/21/2018 and it revealed an EF of 30 to 35% with first grade diastolic dysfunction, moderate right ventricular dilation and mild right atrial dilatation with mild mitral regurgitation.  The patient had a portable chest x-ray that showed cardiomegaly and continued moderate pleural effusion as well as left lower lobe dense opacity, collapse versus consolidation possibly atelectasis versus pneumonia. A chest CTA revealed no evidence for PE. It showed bilateral pleural effusion with atelectasis in the lower lobes with groundglass densities and edema likely secondary to edema with infectious etiology less likely. It also showed right lower lobe density that could be small airway disease and/over mucoid impaction.EKG was suboptimal due to motion and showed likely accelerated junctional rhythm with a rate of 65 with nonspecific intraventricular conduction delay.  Acute on chronic systolic and diastolic CHF. The patient's last EF was 30 to 35% with grade 1 diastolic dysfunction within less than 6 months. - Echocardiogram showing mild global LV systolic dysfunction without evidence of apparent endocarditis.  Ejection fraction 40 to 45% - TEE shows continued LV dysfunction EF 35-40% without evidence of endocarditis - was onIV Lasix 80 BID.Now on Lasix 40  mg BID. - cardiac enzyme 0.06 x 3. Appreciatecardiology consult by Dr. Gwen Pounds. Low suspicion for ischemic event. - continue Aldactone. Sherryll Burger started3/14/20per cardiology - contribution thought possible from bacteremia, repeat blood cultures however are negative, likely contaminant. - carvedilol   Lower respiratory infection with possible mucoid right lung plug. His groundglass densities are more consistent with edema. - empiricIV Rocephin and p.o. Zithromax, blood culture negative on repeat  as well as obtain sputum Gram stain culture and sensitivity.  Zithromax was discontinued per record.  - ID consulted, appreciate input: blood culture likely contaminant, no need for abx after tomorrow - continuescheduled and as needed DuoNeb as well as Mucinex.Appreciate Pulmonology input  Bacteremia, rothia mucilanginosa, streptococcus viridans, likely contaminant Repeat blood cultures no growth.  ID Dr. Rivka Safer consulted, TEE no evidence endocarditis. Likely contaminant in blood culture initially. No need for abx after tomorrow.  Hypokalemia: repleted and resolved. Pharmacy following, appreciate input.  Hypertension continue Coreg, Imdur, aldactone.  Dyslipidemia.continue his statin therapy while following LFTs.  Elevated fibrin derivative d-dimer. His chest CTA was negative for PE. bilateral lower extremity venous duplexnegative as well  OSA BiPAP per Pulmonology. Beard trimmed for fit.  COPD continue duonebs. No need for steroids. Inhaler optimization per Pulmonology.   DVT prophylaxis.on subcutaneous Lovenox.  Palliative care following due to his multiple comorbidities for symptom management, counseling, care coordination.  Constipationbowel regimen ordered, monitor daily. RN gave Senna 2 days in a row. No BM 4 days.  Anemia stable, monitor  CKD stable, crt 0.96 BUN 25  CM and cardiac rehab RN following for discharge needs:  CHF paramedicine  program of [place] - also  HH - RN - HF ReDS Vest Protocol, OT, PT, Aide.  DME Arranged: Other see comment(Hoyer Lift) DME Agency: [facility] HH Arranged: RN, PT, Nurse's Aide, OT(ReDS Vest) HH Agency: [facility] (now [facility])  All the records are reviewed and case is discussed with Care Management/Social Worker. Management plans discussed with the patient and/or family and they are in agreement.  CODE STATUS: Full Code  TOTAL TIME TAKING CARE OF THIS PATIENT: 30 minutes.   More than 50% of the time was spent in counseling/coordination of care: YES  POSSIBLE D/C IN 1 DAYS, DEPENDING ON CLINICAL CONDITION.   Trayon Krantz PA-C on 11/18/2018 at 8:25 PM  Between 7am to 6pm - Pager - [phone]  After 6 pm go to [email or web address] - Social research officer, government  [employer] [place] [facility]  Office  [phone]  CC: Primary care physician; [name], MD  Note: This dictation was prepared with Dragon dictation along with smaller phrase technology. Any transcriptional errors that result from this process are unintentional.

## 2018-11-18 NOTE — Progress Notes (Signed)
Wound dressings assessed, and are clean, dry, and intact.The L.L.E. is to be changed Q 3 days (Friday), and the toe dressing is be changed every other day (either tomorrow or Friday). Reinforced the L.L.E. dressing with tape at this time. Will continue to monitor dressing status for the remainder of the shift. Jari Favre [facility]

## 2018-11-18 NOTE — Progress Notes (Signed)
Palliative: Mr. Ewert is resting quietly in bed.  He wakes easily when called his name.  He will make to keep eye contact, is calm cooperative and pleasant.  There is no family at bedside at this time.   We talked about Mr. Mihalek TEE this morning.  He tells me that they found no problems with his heart.  I shared that we will treat him with antibiotics for an extended period of time, he is to follow-up with infectious disease.  We also talked about following up with a trusted physician.  Exam: Makes and mostly keeps eye contact, calm and cooperative.  No respiratory distress, obese abdomen soft; multiple bruises bilateral upper extremities.  Plan: Home with home health services Follow-up with physicians as directed Continue CODE STATUS discussions.  25 minutes Lillia Carmel, NP  Palliative Medicine Team  Team Phone # [phone]  Greater than 50% of this time was spent counseling and coordinating care related to the above assessment and plan.

## 2018-11-19 ENCOUNTER — Encounter: Payer: Self-pay | Admitting: Internal Medicine

## 2018-11-19 ENCOUNTER — Telehealth (HOSPITAL_COMMUNITY): Payer: Self-pay

## 2018-11-19 LAB — CULTURE, BLOOD (ROUTINE X 2)
Culture: NO GROWTH
Special Requests: ADEQUATE

## 2018-11-19 LAB — BASIC METABOLIC PANEL
Anion gap: 8 (ref 5–15)
BUN: 25 mg/dL — ABNORMAL HIGH (ref 8–23)
CO2: 31 mmol/L (ref 22–32)
Calcium: 8.5 mg/dL — ABNORMAL LOW (ref 8.9–10.3)
Chloride: 101 mmol/L (ref 98–111)
Creatinine, Ser: 0.86 mg/dL (ref 0.61–1.24)
GFR calc Af Amer: >60 mL/min (ref >60)
GFR calc non Af Amer: >60 mL/min (ref >60)
GLUCOSE: 90 mg/dL (ref 70–99)
Potassium: 3.8 mmol/L (ref 3.5–5.1)
Sodium: 140 mmol/L (ref 135–145)

## 2018-11-19 LAB — CBC
HEMATOCRIT: 31.1 % — AB (ref 39.0–52.0)
Hemoglobin: 9.8 g/dL — ABNORMAL LOW (ref 13.0–17.0)
MCH: 30.8 pg (ref 26.0–34.0)
MCHC: 31.5 g/dL (ref 30.0–36.0)
MCV: 97.8 fL (ref 80.0–100.0)
Platelets: 185 10*3/uL (ref 150–400)
RBC: 3.18 MIL/uL — ABNORMAL LOW (ref 4.22–5.81)
RDW: 17.1 % — AB (ref 11.5–15.5)
WBC: 5.3 10*3/uL (ref 4.0–10.5)
nRBC: 0 % (ref 0.0–0.2)

## 2018-11-19 LAB — MAGNESIUM: Magnesium: 1.8 mg/dL (ref 1.7–2.4)

## 2018-11-19 MED ORDER — FUROSEMIDE 40 MG PO TABS: 40.0000 mg | ORAL_TABLET | Freq: Two times a day (BID) | ORAL | 0 refills | Status: DC

## 2018-11-19 NOTE — Telephone Encounter (Signed)
Contacted Dylan [name] to make appt for next week.  We made one for Monday afternoon.  He states doing ok.   Earmon Phoenix  EMT-Paramedic [phone]

## 2018-11-19 NOTE — Progress Notes (Signed)
Dylan [name] to be D/C'd Home per MD order.  Discussed prescriptions and follow up appointments with the patient and wife Dylan [name]. Prescriptions given to patient, medication list explained in detail. Pt verbalized understanding.  Allergies as of 11/19/2018   No Known Allergies     Medication List    STOP taking these medications   torsemide 20 MG tablet Commonly known as:  DEMADEX     TAKE these medications   acetaminophen 500 MG tablet Commonly known as:  TYLENOL Take 1,000 mg by mouth 2 (two) times daily as needed for moderate pain.   alum & mag hydroxide-simeth 400-400-40 MG/5ML suspension Commonly known as:  MAALOX PLUS Take 30 mLs by mouth every 4 (four) hours as needed for indigestion.   aspirin EC 81 MG tablet Take 81 mg by mouth daily.   atorvastatin 80 MG tablet Commonly known as:  LIPITOR Take 80 mg by mouth daily.   benzonatate 100 MG capsule Commonly known as:  Tessalon Perles Take 2 capsules (200 mg total) by mouth 3 (three) times daily as needed for cough.   bisacodyl 10 MG suppository Commonly known as:  DULCOLAX Place 10 mg rectally as needed for moderate constipation.   carvedilol 6.25 MG tablet Commonly known as:  COREG Take 6.25 mg by mouth 2 (two) times daily with a meal.   CENTROVITE Tabs Take 1 tablet by mouth daily.   Cholecalciferol 100 MCG (4000 UT) Tabs Take 1 tablet by mouth daily.   clopidogrel 75 MG tablet Commonly known as:  PLAVIX Take 75 mg by mouth daily.   dextromethorphan 30 MG/5ML liquid Commonly known as:  DELSYM Take 5 mLs (30 mg total) by mouth 2 (two) times daily as needed for cough.   dextromethorphan-guaiFENesin 30-600 MG 12hr tablet Commonly known as:  MUCINEX DM Take 1 tablet by mouth 2 (two) times daily as needed for cough.   ENDIT EX Apply to bottom and moisture associated breakdown of coccyx area BID and as need   Entresto 24-26 MG Generic drug:  sacubitril-valsartan Take 1 tablet by mouth 2 (two)  times daily.   furosemide 40 MG tablet Commonly known as:  LASIX Take 1 tablet (40 mg total) by mouth 2 (two) times daily for 30 days. What changed:    medication strength  how much to take   gabapentin 400 MG capsule Commonly known as:  NEURONTIN Take 400 mg by mouth at bedtime.   ipratropium-albuterol 0.5-2.5 (3) MG/3ML Soln Commonly known as:  DUONEB Take 3 mLs by nebulization every 6 (six) hours as needed (shortness of breath).   isosorbide mononitrate 30 MG 24 hr tablet Commonly known as:  IMDUR Take 30 mg by mouth daily.   ketotifen 0.025 % ophthalmic solution Commonly known as:  ZADITOR Place 1 drop into both eyes 2 (two) times daily.   loratadine 10 MG tablet Commonly known as:  CLARITIN Take 10 mg by mouth daily as needed for allergies.   LORazepam 0.5 MG tablet Commonly known as:  ATIVAN Take 0.5 mg by mouth 2 (two) times daily.   magnesium hydroxide 400 MG/5ML suspension Commonly known as:  MILK OF MAGNESIA Take 30 mLs by mouth every 4 (four) hours as needed for mild constipation (No BM in 2 days).   Melatonin 3 MG Tabs Take 1 tablet by mouth at bedtime.   nitroGLYCERIN 0.3 MG SL tablet Commonly known as:  NITROSTAT Place 0.3 mg under the tongue every 5 (five) minutes as needed for chest pain.  polyethylene glycol packet Commonly known as:  MIRALAX / GLYCOLAX Take 17 g by mouth 2 (two) times daily as needed. Hold for loose stools.   potassium chloride SA 20 MEQ tablet Commonly known as:  K-DUR,KLOR-CON Take 20 mEq by mouth 2 (two) times daily.   primidone 50 MG tablet Commonly known as:  MYSOLINE Take 200 mg by mouth 2 (two) times daily.   saccharomyces boulardii 250 MG capsule Commonly known as:  FLORASTOR Take 250 mg by mouth 2 (two) times daily.   sennosides-docusate sodium 8.6-50 MG tablet Commonly known as:  SENOKOT-S Take 2 tablets by mouth 2 (two) times daily as needed for constipation.   spironolactone 25 MG tablet Commonly known  as:  ALDACTONE Take 12.5 mg by mouth daily.   tamsulosin 0.4 MG Caps capsule Commonly known as:  FLOMAX Take 1 capsule (0.4 mg total) by mouth daily.   umeclidinium-vilanterol 62.5-25 MCG/INH Aepb Commonly known as:  ANORO ELLIPTA Inhale 1 puff into the lungs daily.   VITAMIN C PO Take 500 mg by mouth daily.            Durable Medical Equipment  (From admission, onward)         Start     Ordered   11/19/18 0910  DME Oxygen  Once    Question Answer Comment  Mode or (Route) Nasal cannula   Oxygen delivery system Gas      11/19/18 0912          Vitals:   11/19/18 0744 11/19/18 0750  BP: 126/60   Pulse: 60   Resp: 20   Temp: 98.6 F (37 C) 98.6 F (37 C)  SpO2: 98% 94%    Tele box removed and returned. Skin clean, dry and intact without evidence of skin break down, no evidence of skin tears noted. IV catheter discontinued intact. Site without signs and symptoms of complications. Dressing and pressure applied. Pt denies pain at this time. No complaints noted.  An After Visit Summary was printed and given to the patient. Patient escorted via stretcher, and D/C home via EMS.  Rigoberto Noel

## 2018-11-19 NOTE — TOC Transition Note (Signed)
Transition of Care [facility]) - CM/SW Discharge Note   Patient Details  Name: Dylan [name] MRN: [id number] Date of Birth: 1936/08/18  Transition of Care [facility]) CM/SW Contact:  Sherren Kerns, RN Phone Number: 11/19/2018, 9:36 AM   Clinical Narrative:   Patient to DC to home today with [facility].  @ week protocol, RN,PT,Aide,OT.  Will transport via EMS.  Packet placed on chart.  [name] with [facility] is aware of discharge.      Final next level of care: Home w Home Health Services Barriers to Discharge: No Barriers Identified   Patient Goals and CMS Choice Patient states their goals for this hospitalization and ongoing recovery are:: Discharge to home with [facility] health CMS Medicare.gov Compare Post Acute Care list provided to:: Patient Choice offered to / list presented to : Spouse  Discharge Placement                       Discharge Plan and Services Discharge Planning Services: CM Consult Post Acute Care Choice: Durable Medical Equipment          DME Arranged: Other see comment(Hoyer Lift) DME Agency: [employer] HH Arranged: RN, PT, Nurse's Aide, OT(ReDS Vest) HH Agency: [facility] (now [facility])   Social Determinants of Health (SDOH) Interventions     Readmission Risk Interventions Readmission Risk Prevention Plan 11/17/2018 11/17/2018 11/17/2018  Transportation Screening - - Complete  PCP or Specialist appointment within 3-5 days of discharge Complete Patient refused -  PCP/Specialist Appt Not Complete comments - wife is nurse; Dr. Arlana Pouch agrees to sign orders without seeing patient per Jasmine December at [employer] office; wife agrees to make appt. -  HRI or Home Care Consult - (No Data) Complete  SW Recovery Care/Counseling Consult - - Complete  Palliative Care Screening - - Complete  Skilled Nursing Facility - - Not Complete  SNF Comments - - patient declined  Some recent data might be hidden

## 2018-11-19 NOTE — Progress Notes (Signed)
SATURATION QUALIFICATIONS: (This note is used to comply with regulatory documentation for home oxygen)  Patient Saturations on Room Air at Rest = 94%  Patient Saturations on Room Air while Ambulating = n/a%  Patient Saturations on n/a Liters of oxygen while Ambulating = n/a%  Please briefly explain why patient needs home oxygen:

## 2018-11-19 NOTE — TOC Progression Note (Signed)
Transition of Care [facility]) - Progression Note    Patient Details  Name: Dylan [name] MRN: [id number] Date of Birth: 1935-12-05  Transition of Care [facility]) CM/SW Contact  Collie Siad, RN Phone Number: 11/19/2018, 2:16 PM  Clinical Narrative:     This RNCM was notified by [name] with [employer] that they cannot rent [employer] but they can provide one for patient purchase ~$3500. RNCM reached out to Mrs. Naves again and [place] with [employer] has also talked to her.  RNCM sent link to her mobile phone with [employer] for electric lift ~$800 for her to review. She agrees to review product and decide on purchase. Patient is now at home per wife.     Expected Discharge Plan: Home w Home Health Services Barriers to Discharge: No Barriers Identified  Expected Discharge Plan and Services Expected Discharge Plan: Home w Home Health Services   Discharge Planning Services: CM Consult Post Acute Care Choice: Durable Medical Equipment Living arrangements for the past 2 months: Single Family Home Expected Discharge Date: 11/19/18               DME Arranged: Other see comment(Hoyer Lift) DME Agency: [employer] HH Arranged: RN, PT, Nurse's Aide, OT(ReDS Vest) HH Agency: [facility] (now [facility])   Social Determinants of Health (SDOH) Interventions    Readmission Risk Interventions Readmission Risk Prevention Plan 11/17/2018 11/17/2018 11/17/2018  Transportation Screening - - Complete  PCP or Specialist appointment within 3-5 days of discharge Complete Patient refused -  PCP/Specialist Appt Not Complete comments - wife is nurse; Dr. Arlana Pouch agrees to sign orders without seeing patient per Jasmine December at [employer] office; wife agrees to make appt. -  HRI or Home Care Consult - (No Data) Complete  SW Recovery Care/Counseling Consult - - Complete  Palliative Care Screening - - Complete  Skilled Nursing Facility - - Not Complete  SNF Comments - - patient declined  Some recent data might be hidden

## 2018-11-19 NOTE — Progress Notes (Addendum)
Cardiovascular and Pulmonary Nurse Navigator Note:    83 year old male with known history of CAD, CHF, COPD< Systolic CHF with EF 35%, foot drop on the right side chronically for many years due to back surgery, HLD, HTN, MI, Sleep Apnea.  Patient was walking with a walker until December 2019 when he had a fall and sustained a a femur fracture; had surgery and was sent to [facility] for rehab.  Patient presented to the ED on 11/13/2018 with increasing SOB. Patient admitted with dx of HCAP, Acute on Chronic CHF, and functional decline.    CHF Education:?? Heart Failure is not a new diagnosis for this patient.   *Patient unable to perform daily weights, as patient is unable to stand.  Patient will need to do self-assessment daily to assess his HF symptoms.   ? *Reviewed with patient the following information: *Discussed when to call the Dr= Discussed yellow zone= call MD for increased swelling, increased SOB when lying down, chest pressure / discomfort, dizziness, increased fatigue, new cough,  *Red Zone= call 911: struggle to breath, fainting or near fainting, significant chest pain   *Reviewed low sodium / heart healthy diet-  Patient stated he thought he was doing a good job with watching his sodium intake.    *Discussed fluid intake with patient as well. Patient not currently on a fluid restriction, but advised no more than 64 ounces of fluids per day.  Patient reported one physician told him to just drink when he is thirsty and not to drink fluids just to be drinking them.   ? *Instructed patient to take medications as prescribed for heart failure. Explained briefly why pt is on the medications (either make you feel better, live longer or keep you out of the hospital) and discussed monitoring and side effects.  ? *Physical Activity - Patient is bed bound at home.  Patient has hospital bed.  Home Health  Physical Therapy was following patient prior to admission.  Physical therapy has been following  patient as in-patient.  Home Health PT is recommended and has been ordered upon discharge. See RN CM TOC note.     *Smoking Cessation- Patient is a FORMER smoker.? ? *[facility] - Explained the purpose of the HF Clinic. ?Explained to patient the HF Clinic does not replace PCP nor Cardiologist, but is an additional resource to helping patient manage heart failure at home.  Appointment 12/14/2018 at 2:40 p.m.    Patient is scheduled to see Dr. Juliann Pares, Cardiologist, on 11/23/2018 at 2:15 p.m.    Patient also has follow-up appointments with his PCP and Pulmonologist.    Patient is agreeable to having the paramedic with the [facility] Failure [facility]amed[facility] come to his home to visit. This program is free. Insurance is not billed. The paramedic comes to the home to provide assessment and HF education, and review medications.  Earmon Phoenix, Paramedic, informed patient is discharging today after 1 p.m.    Patient thanked me for providing the above information. ? ? Army Melia, RN, BSN, [facility]? [facility]  [facility] Cardiac &?Pulmonary Rehab  Cardiovascular &?Pulmonary Nurse Navigator  Direct Line: [phone]  Department Phone #: [phone] Fax: [phone]? Email Address: [email or web address]

## 2018-11-19 NOTE — Progress Notes (Signed)
Chest vest tx not given. Pt asleep.

## 2018-11-19 NOTE — Consult Note (Signed)
PHARMACY CONSULT NOTE - FOLLOW UP  Pharmacy Consult for Electrolyte Monitoring and Replacement   Recent Labs: Potassium (mmol/L)  Date Value  11/19/2018 3.8  12/02/2012 3.1 (L)   Magnesium (mg/dL)  Date Value  31/49/7026 1.8   Calcium (mg/dL)  Date Value  37/85/8850 8.5 (L)   Calcium, Total (mg/dL)  Date Value  27/74/1287 8.2 (L)   Albumin (g/dL)  Date Value  86/76/7209 2.7 (L)  12/02/2012 3.3 (L)   Sodium (mmol/L)  Date Value  11/19/2018 140  12/02/2012 142   Assessment: 83 YOM with an electrolyte imbalance while aggressively diuresing. He is currently receiving Lasix 80 mg IV q12h - last dose given 3/16 at 0643. No active diuretic order at this time.  Other medications that may affect potassium levels include spironolactone 12.5mg  daily and Entrest 24/26 mg BID. Potassium level has significantly improved since adding therapy.  Plan:  3/19 -K 3.8 Scr 0.86  Mag 1.8  Patient on Lasix 40 po bid, KCL PO BID, Entresto, spironolactone. No additional replacement at this time.  Patient has had stable electrolytes for several days. Pharmacy will sign off of electrolyte consult at this time   Angelique Blonder ,PharmD Clinical Pharmacist 11/19/2018 7:31 AM

## 2018-11-19 NOTE — Discharge Summary (Signed)
[employer] - [place] at [facility]   PATIENT NAME: Dylan [name]    MR#:  [id number]  DATE OF BIRTH:  1936-06-09  DATE OF ADMISSION:  11/13/2018   ADMITTING PHYSICIAN: Arnaldo Natal, MD  DATE OF DISCHARGE: 11/19/2018  1:50 PM  PRIMARY CARE PHYSICIAN: [name], MD   ADMISSION DIAGNOSIS:  SOB (shortness of breath) [R06.02] Pleural effusion [J90] Dyspnea, unspecified type [R06.00] DISCHARGE DIAGNOSIS:  Active Problems:   Acute CHF (congestive heart failure) (HCC)   Goals of care, counseling/discussion   Palliative care by specialist   DNR (do not resuscitate) discussion  SECONDARY DIAGNOSIS:   Past Medical History:  Diagnosis Date  . CAD (coronary artery disease)   . Cervicalgia   . CHF (congestive heart failure) (HCC)   . COPD (chronic obstructive pulmonary disease) (HCC)   . Diastolic heart failure (HCC)   . Foot drop, right   . History of kidney stones   . Hyperlipidemia    unspecified  . Hypertension   . Myocardial infarction (HCC)   . Osteoarthritis   . Shoulder pain, left   . Sleep apnea   . Tremor, essential    HOSPITAL COURSE:   [name] a83 y.o.malewith a history of chronic systolic congestive heart failure, CAD, CKD, anemia, who presented to the emergency room with dyspnea with associated panic attacks as well as orthopnea without worsening lower extremity edema. His most recent prior 2D echo was on 06/21/2018 and it revealed an EF of 30 to 35% with first grade diastolic dysfunction, moderate right ventricular dilation and mild right atrial dilatation with mild mitral regurgitation.  The patient had a portable chest x-ray that showed cardiomegaly and continued moderate pleural effusion as well as left lower lobe dense opacity, collapse versus consolidation possibly atelectasis versus pneumonia. A chest CTA revealed no evidence for PE. It showed bilateral pleural effusion with atelectasis in the lower lobes with  groundglass densities and edema likely secondary to edema with infectious etiology less likely. It also showed right lower lobe density that could be small airway disease and/over mucoid impaction.EKG was suboptimal due to motion and showed likely accelerated junctional rhythm with a rate of 65 with nonspecific intraventricular conduction delay.  Acute on chronic systolic and diastolic CHF. The patient's last EF was 30 to 35% with grade 1 diastolic dysfunction within less than 6 months. - Echocardiogram showing mild global LV systolic dysfunction without evidence of apparent endocarditis. Ejection fraction 40 to 45% - TEE shows continued LV dysfunction EF 35-40% without evidence of endocarditis - was onIV Lasix 80 BID.Now on Lasix 40 mg BID. Continue at home. Discontinue home torsemide in favor of this regimen which has been effective while admitted. - cardiac enzyme 0.06 x 3. Appreciatecardiology consult by Dr. Gwen Pounds. Low suspicion for ischemic event. - continue Aldactone. Sherryll Burger started3/14/20per cardiology - contribution thought possible from bacteremia, repeat blood cultures however are negative, likely contaminant. - outpatient follow-up with cardiology scheduled - follow-up with PCP scheduled - HF clinic appointment scheduled - provided with [facility] needs and CHF teaching by cardiac rehab RN  Lower respiratory infection with possible mucoid right lung plug, resolved His groundglass densities are more consistent with edema. - empiricIV Rocephin and p.o. Zithromax, blood culture negative on repeat  as well as obtain sputum Gram stain culture and sensitivity: sputum may have been ordered by admitting MD but was never collected. Zithromax was discontinued per record.  - ID consulted, appreciate input: blood culture likely contaminant, no need  for abx after tomorrow - continuedscheduled and as needed DuoNeb as well as Mucinex.Appreciate Pulmonology input - outpatient  follow-up with pulmonology scheduled  Bacteremia, rothia mucilanginosa, streptococcus viridans, likely contaminant Repeat blood cultures no growth.  ID Dr. Rivka Safer consulted, TEE no evidence endocarditis. Likely contaminant in blood culture initially. No need for abx on discharge now  Hypokalemia: repleted and resolved. Pharmacy followed appreciate input.  Hypertension continue Entresto, Imdur, aldactone, Lasix  Dyslipidemia.continue his statin therapy while following LFTs outpatient with PCP.  Elevated fibrin derivative d-dimer. His chest CTA was negative for PE. bilateral lower extremity venous duplexnegativeas well  OSA BiPAP per Pulmonology. Beard trimmed for fit.  COPD continue duonebs. No need for steroids. Inhaler optimization per Pulmonology outpatient.  DVT prophylaxis.on subcutaneous Lovenox while admitted.  Palliative care following due to his multiple comorbidities for symptom management, counseling, care coordination. He declined an outpatient palliative care referral when it was offered.  Constipationbowel regimen ordered, monitored daily. RN gave Senna 2 days in a row. No BM 4 days per patient. Continue bowel regimen at home.  Anemia stable, monitor with PCP follow-up.   CKD stable, crt 0.86 BUN 25  CM and cardiac rehab RN following for discharge needs:  CHF paramedicine program of [place] - alsoHH - RN - HF ReDS Vest Protocol, OT, PT, Aide. DME Arranged: Other see comment(Hoyer Lift) DME Agency: [employer] HH Arranged: RN, PT, Nurse's Aide, OT(ReDS Vest) HH Agency: [facility] (now [facility]) CHF teaching provided via cardiac rehab RN   DISCHARGE CONDITIONS:  stable CONSULTS OBTAINED:  Treatment Team:  Lamar Blinks, MD Mertie Moores, MD Lynn Ito, MD DRUG ALLERGIES:  No Known Allergies DISCHARGE MEDICATIONS:   Allergies as of 11/19/2018   No Known Allergies     Medication List     STOP taking these medications   torsemide 20 MG tablet Commonly known as:  DEMADEX     TAKE these medications   acetaminophen 500 MG tablet Commonly known as:  TYLENOL Take 1,000 mg by mouth 2 (two) times daily as needed for moderate pain.   alum & mag hydroxide-simeth 400-400-40 MG/5ML suspension Commonly known as:  MAALOX PLUS Take 30 mLs by mouth every 4 (four) hours as needed for indigestion.   aspirin EC 81 MG tablet Take 81 mg by mouth daily.   atorvastatin 80 MG tablet Commonly known as:  LIPITOR Take 80 mg by mouth daily.   benzonatate 100 MG capsule Commonly known as:  Tessalon Perles Take 2 capsules (200 mg total) by mouth 3 (three) times daily as needed for cough.   bisacodyl 10 MG suppository Commonly known as:  DULCOLAX Place 10 mg rectally as needed for moderate constipation.   carvedilol 6.25 MG tablet Commonly known as:  COREG Take 6.25 mg by mouth 2 (two) times daily with a meal.   CENTROVITE Tabs Take 1 tablet by mouth daily.   Cholecalciferol 100 MCG (4000 UT) Tabs Take 1 tablet by mouth daily.   clopidogrel 75 MG tablet Commonly known as:  PLAVIX Take 75 mg by mouth daily.   dextromethorphan 30 MG/5ML liquid Commonly known as:  DELSYM Take 5 mLs (30 mg total) by mouth 2 (two) times daily as needed for cough.   dextromethorphan-guaiFENesin 30-600 MG 12hr tablet Commonly known as:  MUCINEX DM Take 1 tablet by mouth 2 (two) times daily as needed for cough.   ENDIT EX Apply to bottom and moisture associated breakdown of coccyx area BID and as need  Entresto 24-26 MG Generic drug:  sacubitril-valsartan Take 1 tablet by mouth 2 (two) times daily.   furosemide 40 MG tablet Commonly known as:  LASIX Take 1 tablet (40 mg total) by mouth 2 (two) times daily for 30 days. What changed:    medication strength  how much to take   gabapentin 400 MG capsule Commonly known as:  NEURONTIN Take 400 mg by mouth at bedtime.    ipratropium-albuterol 0.5-2.5 (3) MG/3ML Soln Commonly known as:  DUONEB Take 3 mLs by nebulization every 6 (six) hours as needed (shortness of breath).   isosorbide mononitrate 30 MG 24 hr tablet Commonly known as:  IMDUR Take 30 mg by mouth daily.   ketotifen 0.025 % ophthalmic solution Commonly known as:  ZADITOR Place 1 drop into both eyes 2 (two) times daily.   loratadine 10 MG tablet Commonly known as:  CLARITIN Take 10 mg by mouth daily as needed for allergies.   LORazepam 0.5 MG tablet Commonly known as:  ATIVAN Take 0.5 mg by mouth 2 (two) times daily.   magnesium hydroxide 400 MG/5ML suspension Commonly known as:  MILK OF MAGNESIA Take 30 mLs by mouth every 4 (four) hours as needed for mild constipation (No BM in 2 days).   Melatonin 3 MG Tabs Take 1 tablet by mouth at bedtime.   nitroGLYCERIN 0.3 MG SL tablet Commonly known as:  NITROSTAT Place 0.3 mg under the tongue every 5 (five) minutes as needed for chest pain.   polyethylene glycol packet Commonly known as:  MIRALAX / GLYCOLAX Take 17 g by mouth 2 (two) times daily as needed. Hold for loose stools.   potassium chloride SA 20 MEQ tablet Commonly known as:  K-DUR,KLOR-CON Take 20 mEq by mouth 2 (two) times daily.   primidone 50 MG tablet Commonly known as:  MYSOLINE Take 200 mg by mouth 2 (two) times daily.   saccharomyces boulardii 250 MG capsule Commonly known as:  FLORASTOR Take 250 mg by mouth 2 (two) times daily.   sennosides-docusate sodium 8.6-50 MG tablet Commonly known as:  SENOKOT-S Take 2 tablets by mouth 2 (two) times daily as needed for constipation.   spironolactone 25 MG tablet Commonly known as:  ALDACTONE Take 12.5 mg by mouth daily.   tamsulosin 0.4 MG Caps capsule Commonly known as:  FLOMAX Take 1 capsule (0.4 mg total) by mouth daily.   umeclidinium-vilanterol 62.5-25 MCG/INH Aepb Commonly known as:  ANORO ELLIPTA Inhale 1 puff into the lungs daily.   VITAMIN C PO  Take 500 mg by mouth daily.      DISCHARGE INSTRUCTIONS:   DIET:  Cardiac diet DISCHARGE CONDITION:  Stable ACTIVITY:  Activity as tolerated OXYGEN:  Home Oxygen: Yes.    Oxygen Delivery: 1-2 liters/min via Patient connected to nasal cannula oxygen DISCHARGE LOCATION:  Home with services: CHF paramedicine program of [place] - alsoHH - RN - HF ReDS Vest Protocol, OT, PT, Aide. DME Arranged: Other see comment(Hoyer Lift) DME Agency: [employer] HH Arranged: RN, PT, Nurse's Aide, OT(ReDS Vest) HH Agency: [facility] (now [facility]) CHF teaching provided via cardiac rehab RN   If you experience worsening of your admission symptoms, develop shortness of breath, life threatening emergency, suicidal or homicidal thoughts you must seek medical attention immediately by calling 911 or calling your MD immediately if your symptoms are severe.  You Must read complete instructions/literature along with all the possible adverse reactions/side effects for all the medicines you take and that have been  prescribed to you. Take any new medicines only after you have completely understood and accept all the possible adverse reactions/side effects.   Please note  You were cared for by a hospitalist during your hospital stay. If you have any questions about your discharge medications or the care you received while you were in the hospital after you are discharged, you can call the unit and asked to speak with the hospitalist on call if the hospitalist that took care of you is not available. Once you are discharged, your primary care physician will handle any further medical issues. Please note that NO REFILLS for any discharge medications will be authorized once you are discharged, as it is imperative that you return to your primary care physician (or establish a relationship with a primary care physician if you do not have one) for your aftercare needs so that they can reassess your need  for medications and monitor your lab values.    On the day of Discharge:  VITAL SIGNS:  Blood pressure 126/60, pulse 60, temperature 98.6 F (37 C), temperature source Oral, resp. rate 20, height 6' (1.829 m), weight 124.4 kg, SpO2 94 %. PHYSICAL EXAMINATION:  Physical Exam Constitutional:  Appearance: He is morbidly obese. He isill-appearing.  HENT:  Head: Normocephalicand atraumatic.  Eyes:  Conjunctiva/sclera: Conjunctivae normal.  Pupils: Pupils are equal, round, and reactive to light.  Neck:  Musculoskeletal: Normal range of motionand neck supple.  Thyroid: No thyromegaly.  Trachea: No tracheal deviation.  Cardiovascular:  Rate and Rhythm: Normal rateand regular rhythm.  Heart sounds: Normal heart sounds.  Pulmonary:  Effort: Pulmonary effort is normal. Norespiratory distress.  Breath sounds:Decreased breath sounds. No crackles noted today.Nowheezing.  Chest:  Chest wall: No tenderness.  Abdominal:  General: Bowel sounds are normal. There isdistension and some dependent flank edema. Palpations: Abdomen is soft.  Tenderness: There is no abdominal tenderness.  Musculoskeletal:Normal range of motion.Generalized weakness. Skin: General: Skin is warmand dry.  Findings: R shin dressing intact. L great toe dressing intact (skin tears).  Neurological:  Mental Status: He is alertand oriented to person, place, and time.  Cranial Nerves: No cranial nerve deficit. DATA REVIEW:   CBC Recent Labs  Lab 11/19/18 0428  WBC 5.3  HGB 9.8*  HCT 31.1*  PLT 185    Chemistries  Recent Labs  Lab 11/18/18 0543 11/19/18 0428  NA 140 140  K 3.8 3.8  CL 99 101  CO2 33* 31  GLUCOSE 88 90  BUN 25* 25*  CREATININE 0.96 0.86  CALCIUM 8.6* 8.5*  MG  --  1.8  AST 33  --   ALT 31  --   ALKPHOS 108  --   BILITOT 0.6  --     RADIOLOGY:  No results found.   Management plans discussed with the patient  and/or family and they are in agreement.  CODE STATUS: Prior   TOTAL TIME TAKING CARE OF THIS PATIENT: 45 minutes.    Teriah Muela PA-C on 11/19/2018 at 7:05 PM  Between 7am to 6pm - Pager - [phone]  After 6pm go to [email or web address] - Social research officer, government  [employer]  Hospitalists  Office  [phone]  CC: Primary care physician; [name], MD

## 2018-11-19 NOTE — Discharge Instructions (Signed)
Follow up with your primary doctor as scheduled. Follow up with Dr. Juliann Pares on 3/23.  Follow up with a pulmonologist within 2 weeks for your chronic lung disease management.  Follow up at the heart failure clinic as scheduled.  Continue taking your home meds aside from: stop taking torsemide (demadex). Take Lasix 40 mg by mouth twice daily.

## 2018-11-20 NOTE — Care Management (Signed)
Post discharge note entry 11/20/18 at 0848: this RNCM received call from patient's wife stating that [place] does not have Rx for [employer]. I have notified discharging provider and provided [place]. He will call Flomax in to pharmacy.  Wife updated.  She states she may have found a lift on [employer].

## 2018-11-21 LAB — CULTURE, BLOOD (ROUTINE X 2)
Culture: NO GROWTH
Culture: NO GROWTH
SPECIAL REQUESTS: ADEQUATE
SPECIAL REQUESTS: ADEQUATE

## 2018-11-23 ENCOUNTER — Inpatient Hospital Stay
Admission: EM | Admit: 2018-11-23 | Discharge: 2018-12-01 | DRG: 871 | Disposition: A | Discharge status: Skilled Nursing Facility | Payer: Medicare Other | Attending: Family Medicine | Admitting: Family Medicine

## 2018-11-23 ENCOUNTER — Emergency Department: Payer: Medicare Other

## 2018-11-23 ENCOUNTER — Other Ambulatory Visit: Payer: Self-pay

## 2018-11-23 DIAGNOSIS — J9601 Acute respiratory failure with hypoxia: Secondary | ICD-10-CM | POA: Diagnosis present

## 2018-11-23 DIAGNOSIS — Z9889 Other specified postprocedural states: Secondary | ICD-10-CM

## 2018-11-23 DIAGNOSIS — I252 Old myocardial infarction: Secondary | ICD-10-CM

## 2018-11-23 DIAGNOSIS — J9602 Acute respiratory failure with hypercapnia: Secondary | ICD-10-CM | POA: Diagnosis present

## 2018-11-23 DIAGNOSIS — R7989 Other specified abnormal findings of blood chemistry: Secondary | ICD-10-CM

## 2018-11-23 DIAGNOSIS — J189 Pneumonia, unspecified organism: Secondary | ICD-10-CM | POA: Diagnosis present

## 2018-11-23 DIAGNOSIS — R0902 Hypoxemia: Secondary | ICD-10-CM | POA: Diagnosis present

## 2018-11-23 DIAGNOSIS — Z8052 Family history of malignant neoplasm of bladder: Secondary | ICD-10-CM

## 2018-11-23 DIAGNOSIS — N170 Acute kidney failure with tubular necrosis: Secondary | ICD-10-CM | POA: Diagnosis present

## 2018-11-23 DIAGNOSIS — Z833 Family history of diabetes mellitus: Secondary | ICD-10-CM | POA: Diagnosis not present

## 2018-11-23 DIAGNOSIS — J9600 Acute respiratory failure, unspecified whether with hypoxia or hypercapnia: Secondary | ICD-10-CM

## 2018-11-23 DIAGNOSIS — Z20822 Contact with and (suspected) exposure to covid-19: Secondary | ICD-10-CM

## 2018-11-23 DIAGNOSIS — J440 Chronic obstructive pulmonary disease with acute lower respiratory infection: Secondary | ICD-10-CM | POA: Diagnosis present

## 2018-11-23 DIAGNOSIS — M21371 Foot drop, right foot: Secondary | ICD-10-CM | POA: Diagnosis present

## 2018-11-23 DIAGNOSIS — G250 Essential tremor: Secondary | ICD-10-CM | POA: Diagnosis present

## 2018-11-23 DIAGNOSIS — M542 Cervicalgia: Secondary | ICD-10-CM | POA: Diagnosis present

## 2018-11-23 DIAGNOSIS — A419 Sepsis, unspecified organism: Principal | ICD-10-CM | POA: Diagnosis present

## 2018-11-23 DIAGNOSIS — I130 Hypertensive heart and chronic kidney disease with heart failure and stage 1 through stage 4 chronic kidney disease, or unspecified chronic kidney disease: Secondary | ICD-10-CM | POA: Diagnosis present

## 2018-11-23 DIAGNOSIS — I5043 Acute on chronic combined systolic (congestive) and diastolic (congestive) heart failure: Secondary | ICD-10-CM | POA: Diagnosis present

## 2018-11-23 DIAGNOSIS — Z8249 Family history of ischemic heart disease and other diseases of the circulatory system: Secondary | ICD-10-CM | POA: Diagnosis not present

## 2018-11-23 DIAGNOSIS — I248 Other forms of acute ischemic heart disease: Secondary | ICD-10-CM | POA: Diagnosis present

## 2018-11-23 DIAGNOSIS — Z9981 Dependence on supplemental oxygen: Secondary | ICD-10-CM

## 2018-11-23 DIAGNOSIS — E785 Hyperlipidemia, unspecified: Secondary | ICD-10-CM | POA: Diagnosis present

## 2018-11-23 DIAGNOSIS — Z7902 Long term (current) use of antithrombotics/antiplatelets: Secondary | ICD-10-CM

## 2018-11-23 DIAGNOSIS — Z96652 Presence of left artificial knee joint: Secondary | ICD-10-CM | POA: Diagnosis present

## 2018-11-23 DIAGNOSIS — R0602 Shortness of breath: Secondary | ICD-10-CM

## 2018-11-23 DIAGNOSIS — Z87891 Personal history of nicotine dependence: Secondary | ICD-10-CM

## 2018-11-23 DIAGNOSIS — Z03818 Encounter for observation for suspected exposure to other biological agents ruled out: Secondary | ICD-10-CM

## 2018-11-23 DIAGNOSIS — I2510 Atherosclerotic heart disease of native coronary artery without angina pectoris: Secondary | ICD-10-CM | POA: Diagnosis present

## 2018-11-23 DIAGNOSIS — R6521 Severe sepsis with septic shock: Secondary | ICD-10-CM | POA: Diagnosis present

## 2018-11-23 DIAGNOSIS — Z7982 Long term (current) use of aspirin: Secondary | ICD-10-CM

## 2018-11-23 DIAGNOSIS — N183 Chronic kidney disease, stage 3 (moderate): Secondary | ICD-10-CM | POA: Diagnosis present

## 2018-11-23 DIAGNOSIS — Z800 Family history of malignant neoplasm of digestive organs: Secondary | ICD-10-CM | POA: Diagnosis not present

## 2018-11-23 DIAGNOSIS — R778 Other specified abnormalities of plasma proteins: Secondary | ICD-10-CM

## 2018-11-23 DIAGNOSIS — Z801 Family history of malignant neoplasm of trachea, bronchus and lung: Secondary | ICD-10-CM | POA: Diagnosis not present

## 2018-11-23 DIAGNOSIS — R6889 Other general symptoms and signs: Secondary | ICD-10-CM

## 2018-11-23 LAB — COMPREHENSIVE METABOLIC PANEL WITH GFR
ALT: 50 U/L — ABNORMAL HIGH (ref 0–44)
AST: 63 U/L — ABNORMAL HIGH (ref 15–41)
Albumin: 2.6 g/dL — ABNORMAL LOW (ref 3.5–5.0)
Alkaline Phosphatase: 103 U/L (ref 38–126)
Anion gap: 10 (ref 5–15)
BUN: 29 mg/dL — ABNORMAL HIGH (ref 8–23)
CO2: 31 mmol/L (ref 22–32)
Calcium: 8.5 mg/dL — ABNORMAL LOW (ref 8.9–10.3)
Chloride: 100 mmol/L (ref 98–111)
Creatinine, Ser: 1.39 mg/dL — ABNORMAL HIGH (ref 0.61–1.24)
GFR calc Af Amer: 55 mL/min — ABNORMAL LOW
GFR calc non Af Amer: 47 mL/min — ABNORMAL LOW
Glucose, Bld: 130 mg/dL — ABNORMAL HIGH (ref 70–99)
Potassium: 4.4 mmol/L (ref 3.5–5.1)
Sodium: 141 mmol/L (ref 135–145)
Total Bilirubin: 0.6 mg/dL (ref 0.3–1.2)
Total Protein: 5.8 g/dL — ABNORMAL LOW (ref 6.5–8.1)

## 2018-11-23 LAB — BLOOD GAS, ARTERIAL
Acid-Base Excess: 7.8 mmol/L — ABNORMAL HIGH (ref 0.0–2.0)
Bicarbonate: 32 mmol/L — ABNORMAL HIGH (ref 20.0–28.0)
FIO2: 1
MECHVT: 450 mL
O2 Saturation: 99.9 %
PEEP: 5 cmH2O
Patient temperature: 37
RATE: 16 {breaths}/min
pCO2 arterial: 42 mmHg (ref 32.0–48.0)
pH, Arterial: 7.49 — ABNORMAL HIGH (ref 7.350–7.450)
pO2, Arterial: 316 mmHg — ABNORMAL HIGH (ref 83.0–108.0)

## 2018-11-23 LAB — CBC WITH DIFFERENTIAL/PLATELET
Abs Immature Granulocytes: 0.07 K/uL (ref 0.00–0.07)
Basophils Absolute: 0 K/uL (ref 0.0–0.1)
Basophils Relative: 0 %
Eosinophils Absolute: 0.1 K/uL (ref 0.0–0.5)
Eosinophils Relative: 1 %
HCT: 31.3 % — ABNORMAL LOW (ref 39.0–52.0)
Hemoglobin: 9.9 g/dL — ABNORMAL LOW (ref 13.0–17.0)
Immature Granulocytes: 1 %
Lymphocytes Relative: 4 %
Lymphs Abs: 0.5 K/uL — ABNORMAL LOW (ref 0.7–4.0)
MCH: 31.4 pg (ref 26.0–34.0)
MCHC: 31.6 g/dL (ref 30.0–36.0)
MCV: 99.4 fL (ref 80.0–100.0)
Monocytes Absolute: 0.2 K/uL (ref 0.1–1.0)
Monocytes Relative: 1 %
Neutro Abs: 10.7 K/uL — ABNORMAL HIGH (ref 1.7–7.7)
Neutrophils Relative %: 93 %
Platelets: 157 K/uL (ref 150–400)
RBC: 3.15 MIL/uL — ABNORMAL LOW (ref 4.22–5.81)
RDW: 17.9 % — ABNORMAL HIGH (ref 11.5–15.5)
WBC: 11.5 K/uL — ABNORMAL HIGH (ref 4.0–10.5)
nRBC: 0 % (ref 0.0–0.2)

## 2018-11-23 LAB — URINALYSIS, COMPLETE (UACMP) WITH MICROSCOPIC
Bacteria, UA: NONE SEEN
Bilirubin Urine: NEGATIVE
Glucose, UA: NEGATIVE mg/dL
Hgb urine dipstick: NEGATIVE
Ketones, ur: NEGATIVE mg/dL
Leukocytes,Ua: NEGATIVE
Nitrite: NEGATIVE
Protein, ur: 30 mg/dL — AB
Specific Gravity, Urine: 1.014 (ref 1.005–1.030)
pH: 6 (ref 5.0–8.0)

## 2018-11-23 LAB — RESPIRATORY PANEL BY PCR
ADENOVIRUS-RVPPCR: NOT DETECTED
Bordetella pertussis: NOT DETECTED
CORONAVIRUS 229E-RVPPCR: NOT DETECTED
CORONAVIRUS HKU1-RVPPCR: NOT DETECTED
Chlamydophila pneumoniae: NOT DETECTED
Coronavirus NL63: NOT DETECTED
Coronavirus OC43: NOT DETECTED
Influenza A: NOT DETECTED
Influenza B: NOT DETECTED
Metapneumovirus: NOT DETECTED
Mycoplasma pneumoniae: NOT DETECTED
Parainfluenza Virus 1: NOT DETECTED
Parainfluenza Virus 2: NOT DETECTED
Parainfluenza Virus 3: NOT DETECTED
Parainfluenza Virus 4: NOT DETECTED
Respiratory Syncytial Virus: NOT DETECTED
Rhinovirus / Enterovirus: NOT DETECTED

## 2018-11-23 LAB — TSH: TSH: 4.119 u[IU]/mL (ref 0.350–4.500)

## 2018-11-23 LAB — MRSA PCR SCREENING: MRSA by PCR: NEGATIVE

## 2018-11-23 LAB — LACTIC ACID, PLASMA: Lactic Acid, Venous: 1.5 mmol/L (ref 0.5–1.9)

## 2018-11-23 LAB — INFLUENZA PANEL BY PCR (TYPE A & B)
Influenza A By PCR: NEGATIVE
Influenza B By PCR: NEGATIVE

## 2018-11-23 LAB — GLUCOSE, CAPILLARY
Glucose-Capillary: 102 mg/dL — ABNORMAL HIGH (ref 70–99)
Glucose-Capillary: 115 mg/dL — ABNORMAL HIGH (ref 70–99)
Glucose-Capillary: 126 mg/dL — ABNORMAL HIGH (ref 70–99)
Glucose-Capillary: 146 mg/dL — ABNORMAL HIGH (ref 70–99)

## 2018-11-23 LAB — PROCALCITONIN: Procalcitonin: 0.21 ng/mL

## 2018-11-23 LAB — TROPONIN I
Troponin I: 0.11 ng/mL
Troponin I: 0.09 ng/mL (ref <0.03)

## 2018-11-23 LAB — PROTIME-INR
INR: 1.2 (ref 0.8–1.2)
Prothrombin Time: 14.9 seconds (ref 11.4–15.2)

## 2018-11-23 LAB — BRAIN NATRIURETIC PEPTIDE: B Natriuretic Peptide: 850 pg/mL — ABNORMAL HIGH (ref 0.0–100.0)

## 2018-11-23 MED ORDER — SODIUM CHLORIDE 0.9 % IV BOLUS (SEPSIS)
400.0000 mL | Freq: Once | INTRAVENOUS | Status: AC
  Administered 2018-11-23: 400 mL via INTRAVENOUS

## 2018-11-23 MED ORDER — CLOPIDOGREL BISULFATE 75 MG PO TABS
75.0000 mg | ORAL_TABLET | ORAL | Status: DC
  Administered 2018-11-24 – 2018-11-25 (×2): 75 mg
  Filled 2018-11-23 (×2): qty 1

## 2018-11-23 MED ORDER — SODIUM CHLORIDE 0.9 % IV SOLN
2.0000 g | Freq: Two times a day (BID) | INTRAVENOUS | Status: DC
  Administered 2018-11-23 – 2018-11-28 (×10): 2 g via INTRAVENOUS
  Filled 2018-11-23 (×11): qty 2

## 2018-11-23 MED ORDER — NOREPINEPHRINE BITARTRATE 1 MG/ML IV SOLN
0.0000 ug/min | INTRAVENOUS | Status: DC
  Administered 2018-11-23: 2 ug/min via INTRAVENOUS
  Administered 2018-11-23: 9 ug/min via INTRAVENOUS
  Administered 2018-11-24: 8 ug/min via INTRAVENOUS
  Administered 2018-11-24 – 2018-11-25 (×2): 5 ug/min via INTRAVENOUS
  Filled 2018-11-23 (×5): qty 4

## 2018-11-23 MED ORDER — SODIUM CHLORIDE 0.9 % IV SOLN
2.0000 g | Freq: Once | INTRAVENOUS | Status: AC
  Administered 2018-11-23: 2 g via INTRAVENOUS

## 2018-11-23 MED ORDER — IPRATROPIUM-ALBUTEROL 0.5-2.5 (3) MG/3ML IN SOLN: 3.0000 mL | Freq: Four times a day (QID) | RESPIRATORY_TRACT | Status: DC

## 2018-11-23 MED ORDER — ASPIRIN EC 81 MG PO TBEC: 81.0000 mg | DELAYED_RELEASE_TABLET | Freq: Every day | ORAL | Status: DC

## 2018-11-23 MED ORDER — SUCCINYLCHOLINE CHLORIDE 20 MG/ML IJ SOLN
200.0000 mg | Freq: Once | INTRAMUSCULAR | Status: AC
  Administered 2018-11-23: 200 mg via INTRAVENOUS
  Filled 2018-11-23: qty 1

## 2018-11-23 MED ORDER — SODIUM CHLORIDE 0.9 % IV SOLN
2.0000 g | Freq: Two times a day (BID) | INTRAVENOUS | Status: DC
  Administered 2018-11-23: 2 g via INTRAVENOUS
  Filled 2018-11-23 (×2): qty 2

## 2018-11-23 MED ORDER — CHLORHEXIDINE GLUCONATE 0.12% ORAL RINSE (MEDLINE KIT)
15.0000 mL | Freq: Two times a day (BID) | OROMUCOSAL | Status: DC
  Administered 2018-11-23 – 2018-11-26 (×7): 15 mL via OROMUCOSAL

## 2018-11-23 MED ORDER — ACETAMINOPHEN 325 MG PO TABS
650.0000 mg | ORAL_TABLET | Freq: Four times a day (QID) | ORAL | Status: DC | PRN
  Administered 2018-11-26 – 2018-11-30 (×3): 650 mg via ORAL
  Filled 2018-11-23 (×3): qty 2

## 2018-11-23 MED ORDER — ONDANSETRON HCL 4 MG PO TABS: 4.0000 mg | ORAL_TABLET | Freq: Four times a day (QID) | ORAL | Status: DC | PRN

## 2018-11-23 MED ORDER — ACETAMINOPHEN 650 MG RE SUPP: 650.0000 mg | Freq: Four times a day (QID) | RECTAL | Status: DC | PRN

## 2018-11-23 MED ORDER — FUROSEMIDE 10 MG/ML IJ SOLN
60.0000 mg | INTRAMUSCULAR | Status: AC
  Administered 2018-11-23: 60 mg via INTRAVENOUS
  Filled 2018-11-23: qty 8

## 2018-11-23 MED ORDER — VANCOMYCIN HCL IN DEXTROSE 1-5 GM/200ML-% IV SOLN
1000.0000 mg | Freq: Once | INTRAVENOUS | Status: AC
  Administered 2018-11-23: 1000 mg via INTRAVENOUS
  Filled 2018-11-23: qty 200

## 2018-11-23 MED ORDER — PROPOFOL 1000 MG/100ML IV EMUL
5.0000 ug/kg/min | INTRAVENOUS | Status: DC
  Administered 2018-11-23: 5 ug/kg/min via INTRAVENOUS
  Administered 2018-11-23: 30 ug/kg/min via INTRAVENOUS
  Administered 2018-11-23: 15 ug/kg/min via INTRAVENOUS
  Administered 2018-11-23: 25 ug/kg/min via INTRAVENOUS
  Administered 2018-11-23 – 2018-11-24 (×3): 30 ug/kg/min via INTRAVENOUS
  Filled 2018-11-23 (×8): qty 100

## 2018-11-23 MED ORDER — IPRATROPIUM-ALBUTEROL 20-100 MCG/ACT IN AERS
1.0000 | INHALATION_SPRAY | Freq: Four times a day (QID) | RESPIRATORY_TRACT | Status: DC
  Filled 2018-11-23: qty 4

## 2018-11-23 MED ORDER — ENOXAPARIN SODIUM 40 MG/0.4ML ~~LOC~~ SOLN: 40.0000 mg | SUBCUTANEOUS | Status: DC

## 2018-11-23 MED ORDER — ORAL CARE MOUTH RINSE
15.0000 mL | OROMUCOSAL | Status: DC
  Administered 2018-11-23 – 2018-11-25 (×23): 15 mL via OROMUCOSAL

## 2018-11-23 MED ORDER — VANCOMYCIN HCL 10 G IV SOLR
1250.0000 mg | INTRAVENOUS | Status: DC
  Filled 2018-11-23: qty 1250

## 2018-11-23 MED ORDER — PANTOPRAZOLE SODIUM 40 MG IV SOLR
40.0000 mg | INTRAVENOUS | Status: DC
  Administered 2018-11-23: 40 mg via INTRAVENOUS
  Filled 2018-11-23: qty 40

## 2018-11-23 MED ORDER — ASPIRIN 81 MG PO CHEW
81.0000 mg | CHEWABLE_TABLET | Freq: Every day | ORAL | Status: DC
  Administered 2018-11-23 – 2018-11-25 (×3): 81 mg
  Filled 2018-11-23 (×3): qty 1

## 2018-11-23 MED ORDER — SODIUM CHLORIDE 0.9 % IV BOLUS (SEPSIS)
1000.0000 mL | Freq: Once | INTRAVENOUS | Status: AC
  Administered 2018-11-23: 1000 mL via INTRAVENOUS

## 2018-11-23 MED ORDER — IPRATROPIUM-ALBUTEROL 0.5-2.5 (3) MG/3ML IN SOLN: 3.0000 mL | Freq: Four times a day (QID) | RESPIRATORY_TRACT | Status: DC | PRN

## 2018-11-23 MED ORDER — ENOXAPARIN SODIUM 40 MG/0.4ML ~~LOC~~ SOLN
40.0000 mg | SUBCUTANEOUS | Status: DC
  Administered 2018-11-23 – 2018-11-27 (×5): 40 mg via SUBCUTANEOUS
  Filled 2018-11-23 (×5): qty 0.4

## 2018-11-23 MED ORDER — IPRATROPIUM-ALBUTEROL 0.5-2.5 (3) MG/3ML IN SOLN
3.0000 mL | Freq: Two times a day (BID) | RESPIRATORY_TRACT | Status: DC
  Administered 2018-11-23: 3 mL via RESPIRATORY_TRACT
  Filled 2018-11-23: qty 3

## 2018-11-23 MED ORDER — ALBUTEROL SULFATE HFA 108 (90 BASE) MCG/ACT IN AERS: 2.0000 | INHALATION_SPRAY | Freq: Four times a day (QID) | RESPIRATORY_TRACT | Status: DC | PRN

## 2018-11-23 MED ORDER — CLOPIDOGREL BISULFATE 75 MG PO TABS
75.0000 mg | ORAL_TABLET | Freq: Every day | ORAL | Status: DC
  Administered 2018-11-23: 75 mg
  Filled 2018-11-23: qty 1

## 2018-11-23 MED ORDER — ETOMIDATE 2 MG/ML IV SOLN
20.0000 mg | Freq: Once | INTRAVENOUS | Status: AC
  Administered 2018-11-23: 20 mg via INTRAVENOUS
  Filled 2018-11-23: qty 10

## 2018-11-23 MED ORDER — PIPERACILLIN-TAZOBACTAM 3.375 G IVPB 30 MIN
3.3750 g | Freq: Once | INTRAVENOUS | Status: AC
  Administered 2018-11-23: 3.375 g via INTRAVENOUS
  Filled 2018-11-23: qty 50

## 2018-11-23 MED ORDER — IPRATROPIUM BROMIDE HFA 17 MCG/ACT IN AERS: 2.0000 | INHALATION_SPRAY | Freq: Four times a day (QID) | RESPIRATORY_TRACT | Status: DC | PRN

## 2018-11-23 MED ORDER — ONDANSETRON HCL 4 MG/2ML IJ SOLN: 4.0000 mg | Freq: Four times a day (QID) | INTRAMUSCULAR | Status: DC | PRN

## 2018-11-23 NOTE — Progress Notes (Signed)
Patient transported from ED room to ICU room on Vent, with no complications. Report given to ICU RT.

## 2018-11-23 NOTE — Consult Note (Signed)
Name: Dylan [name] MRN: [id number] DOB: 03-17-1936     CONSULTATION DATE: 11/23/2018  REFERRING MD :  Bridgett Larsson  CHIEF COMPLAINT:  resp failure     HISTORY OF PRESENT ILLNESS:   h/o CAD, hypertension and recent discharge from the hospital on 11/19/2018 for acute on chronic combined heart failure exacerbation and bacteremia presents to the emergency department via EMS due to shortness of breath.   The patient was in respiratory distress and was placed on nonrebreather.  Work of breathing and oxygenation required intubation upon arrival.    febrile to 42 F yesterday and has been coughing.    Sepsis protocol was initiated as well as COVID-19 testing.  On vent support On vasopressors  PAST MEDICAL HISTORY :   has a past medical history of CAD (coronary artery disease), Cervicalgia, CHF (congestive heart failure) ([place]), COPD (chronic obstructive pulmonary disease) ([place]), Diastolic heart failure ([place]), Foot drop, right, History of kidney stones, Hyperlipidemia, Hypertension, Myocardial infarction [facility]), Osteoarthritis, Shoulder pain, left, Sleep apnea, and Tremor, essential.  has a past surgical history that includes Knee surgery (Left, 1998); Back surgery (06/2010); Knee arthroscopy (Left); Tonsillectomy; Colonoscopy; Wound debridement (Right, 11/13/2017); Application if wound vac (Right, 11/13/2017); Coronary angioplasty; Cardiac catheterization (Left, 04/30/2016); Knee Arthroplasty (Left, 06/01/2018); Coronary/Graft Acute MI Revascularization (N/A, 06/21/2018); LEFT HEART CATH AND CORONARY ANGIOGRAPHY (N/A, 06/21/2018); ORIF femur fracture (Left, 08/27/2018); and TEE without cardioversion (N/A, 11/18/2018). Prior to Admission medications   Medication Sig Start Date End Date Taking? Authorizing Provider  acetaminophen (TYLENOL) 500 MG tablet Take 1,000 mg by mouth 2 (two) times daily as needed for moderate pain.    [provider]  alum & mag hydroxide-simeth (MAALOX PLUS) 400-400-40  MG/5ML suspension Take 30 mLs by mouth every 4 (four) hours as needed for indigestion. 09/04/18   [provider]  Ascorbic Acid (VITAMIN C PO) Take 500 mg by mouth daily.     [provider]  aspirin EC 81 MG tablet Take 81 mg by mouth daily.    [provider]  atorvastatin (LIPITOR) 80 MG tablet Take 80 mg by mouth daily.  09/04/18   [provider]  benzonatate (TESSALON PERLES) 100 MG capsule Take 2 capsules (200 mg total) by mouth 3 (three) times daily as needed for cough. 10/31/18   [name], Avel Peace, MD  bisacodyl (DULCOLAX) 10 MG suppository Place 10 mg rectally as needed for moderate constipation.    [provider]  carvedilol (COREG) 6.25 MG tablet Take 6.25 mg by mouth 2 (two) times daily with a meal. 09/07/18   [provider]  Cholecalciferol 100 MCG (4000 UT) TABS Take 1 tablet by mouth daily. 09/07/18   [provider]  clopidogrel (PLAVIX) 75 MG tablet Take 75 mg by mouth daily.    [provider]  dextromethorphan (DELSYM) 30 MG/5ML liquid Take 5 mLs (30 mg total) by mouth 2 (two) times daily as needed for cough. 10/31/18   [name], Avel Peace, MD  dextromethorphan-guaiFENesin (MUCINEX DM) 30-600 MG 12hr tablet Take 1 tablet by mouth 2 (two) times daily as needed for cough.    [provider]  furosemide (LASIX) 40 MG tablet Take 1 tablet (40 mg total) by mouth 2 (two) times daily for 30 days. 11/19/18 12/19/18  Ripley Fraise, PA  gabapentin (NEURONTIN) 400 MG capsule Take 400 mg by mouth at bedtime.    [provider]  ipratropium-albuterol (DUONEB) 0.5-2.5 (3) MG/3ML SOLN Take 3 mLs by nebulization every 6 (six) hours  as needed (shortness of breath).     [provider]  isosorbide mononitrate (IMDUR) 30 MG 24 hr tablet Take 30 mg by mouth daily.  05/31/16   [provider]  ketotifen (ZADITOR) 0.025 % ophthalmic solution Place 1 drop into both eyes 2 (two) times daily. 01/14/18   Loletha Grayer, MD  loratadine (CLARITIN) 10 MG tablet Take 10 mg by mouth daily as needed for allergies.    [provider]  LORazepam (ATIVAN) 0.5 MG tablet Take 0.5 mg by mouth 2 (two) times daily. 11/05/18   [provider]  magnesium hydroxide (MILK OF MAGNESIA) 400 MG/5ML suspension Take 30 mLs by mouth every 4 (four) hours as needed for mild constipation (No BM in 2 days).    [provider]  Melatonin 3 MG TABS Take 1 tablet by mouth at bedtime. 09/10/18   [provider]  Multiple Vitamins-Minerals (CENTROVITE) TABS Take 1 tablet by mouth daily.    [provider]  nitroGLYCERIN (NITROSTAT) 0.3 MG SL tablet Place 0.3 mg under the tongue every 5 (five) minutes as needed for chest pain.    [provider]  polyethylene glycol (MIRALAX / GLYCOLAX) packet Take 17 g by mouth 2 (two) times daily as needed. Hold for loose stools. 09/29/18   [provider]  potassium chloride SA (K-DUR,KLOR-CON) 20 MEQ tablet Take 20 mEq by mouth 2 (two) times daily.     [provider]  primidone (MYSOLINE) 50 MG tablet Take 200 mg by mouth 2 (two) times daily.     [provider]  saccharomyces boulardii (FLORASTOR) 250 MG capsule Take 250 mg by mouth 2 (two) times daily. 10/26/18 12/04/18  [provider]  sacubitril-valsartan (ENTRESTO) 24-26 MG Take 1 tablet by mouth 2 (two) times daily.    [provider]  sennosides-docusate sodium (SENOKOT-S) 8.6-50 MG tablet Take 2 tablets by mouth 2 (two) times daily as needed for constipation.  09/07/18   [provider]  Skin Protectants, Misc. (ENDIT EX) Apply to bottom and moisture associated breakdown of coccyx area BID and as need 09/15/18   [provider]  spironolactone (ALDACTONE) 25 MG tablet Take 12.5 mg by mouth daily.  09/04/18   [provider]  tamsulosin (FLOMAX) 0.4 MG CAPS capsule Take 1 capsule (0.4 mg total) by mouth daily. 09/01/18   Duanne Guess, PA-C  umeclidinium-vilanterol (ANORO ELLIPTA) 62.5-25 MCG/INH AEPB Inhale 1 puff into the lungs daily. 09/04/17   Demetrios Loll, MD   No Known Allergies  FAMILY HISTORY:  family history includes Bladder Cancer in his brother; Cancer in his mother; Colon cancer in his mother; Diabetes in his son; Heart disease in his father; Lung cancer in his mother; Tremor in his brother, father, and sister. SOCIAL HISTORY:  reports that he has quit smoking. His smoking use included cigarettes. He has a 14.50 pack-year smoking history. He has quit using smokeless tobacco. He reports that he does not drink alcohol or use drugs.  REVIEW OF SYSTEMS:   Unable to obtain due to critical illness   VITAL SIGNS: Temp:  [97 F (36.1 C)] 97 F (36.1 C) (03/23 0415) Pulse Rate:  [51-66] 53 (03/23 0700) Resp:  [10-27] 16 (03/23 0700) BP: (78-114)/(46-71) 101/51 (03/23 0700) SpO2:  [90 %-100 %] 97 % (03/23 0700) FiO2 (%):  [30 %-100 %] 30 % (03/23 4967) Weight:  [591 kg] 125 kg (03/23 0346)  Physical Examination:  GENERAL:critically ill appearing, +resp distress HEAD: Normocephalic,  atraumatic.  EYES: Pupils equal, round, reactive to light.  No scleral icterus.  MOUTH: Moist mucosal membrane. NECK: Supple. No JVD.  PULMONARY: +rhonchi, +wheezing CARDIOVASCULAR: S1 and S2. Regular rate and rhythm. No murmurs, rubs, or gallops.  GASTROINTESTINAL: Soft, nontender, -distended. No masses. Positive bowel sounds. No hepatosplenomegaly.  MUSCULOSKELETAL: No swelling, clubbing, or edema.  NEUROLOGIC: obtunded SKIN:intact,warm,dry     CULTURE RESULTS   Recent Results (from the past 240 hour(s))  Blood culture (routine x 2)     Status: Abnormal   Collection Time: 11/14/18  4:10 AM  Result Value Ref Range Status   Specimen Description   Final    BLOOD RIGHT FOREARM Performed at [facility], [address] [place], [place] [address]    Special Requests   Final    BOTTLES DRAWN AEROBIC AND  ANAEROBIC Blood Culture adequate volume Performed at [facility], [address] [place], [place] [address]    Culture  Setup Time   Final    GRAM POSITIVE COCCI IN BOTH AEROBIC AND ANAEROBIC BOTTLES CRITICAL RESULT CALLED TO, READ BACK BY AND VERIFIED WITH: LISA KLUTTZ @ 2106 11/14/2018 TTG Performed at [facility], [place] [address], [place], [place] [address]    Culture (A)  Final    ROTHIA MUCILAGINOSA Standardized susceptibility testing for this organism is not available. VIRIDANS STREPTOCOCCUS    Report Status 11/17/2018 FINAL  Final  Blood culture (routine x 2)     Status: None   Collection Time: 11/14/18  4:10 AM  Result Value Ref Range Status   Specimen Description BLOOD LEFT ANTECUBITAL  Final   Special Requests   Final    BOTTLES DRAWN AEROBIC AND ANAEROBIC Blood Culture adequate volume   Culture   Final    NO GROWTH 5 DAYS Performed at [facility], [address] [place], [place] [address]    Report Status 11/19/2018 FINAL  Final  Blood Culture ID Panel (Reflexed)     Status: Abnormal   Collection Time: 11/14/18  4:10 AM  Result Value Ref Range Status   Enterococcus species NOT DETECTED NOT DETECTED Final   Listeria monocytogenes NOT DETECTED NOT DETECTED Final   Staphylococcus species DETECTED (A) NOT DETECTED Final    Comment: Methicillin (oxacillin) resistant coagulase negative staphylococcus. Possible blood culture contaminant (unless isolated from more than one blood culture draw or clinical case suggests pathogenicity). No antibiotic treatment is indicated for blood  culture contaminants. CRITICAL RESULT CALLED TO, READ BACK BY AND VERIFIED WITH: LISA KLUTTZ '@2106'  11/14/2018 BY [email or web address]    Staphylococcus aureus (BCID) NOT DETECTED NOT DETECTED Final   Methicillin resistance DETECTED (A) NOT DETECTED Final    Comment: CRITICAL RESULT CALLED TO, READ BACK BY AND VERIFIED WITH: LISA KLUTTZ '@2106'  11/14/2018 TTG    Streptococcus species  DETECTED (A) NOT DETECTED Final    Comment: Not Enterococcus species, Streptococcus agalactiae, Streptococcus pyogenes, or Streptococcus pneumoniae. CRITICAL RESULT CALLED TO, READ BACK BY AND VERIFIED WITH: LISA KLUTTZ '@2106'  11/14/2018 BY [email or web address]    Streptococcus agalactiae NOT DETECTED NOT DETECTED Final   Streptococcus pneumoniae NOT DETECTED NOT DETECTED Final   Streptococcus pyogenes NOT DETECTED NOT DETECTED Final   Acinetobacter baumannii NOT DETECTED NOT DETECTED Final   Enterobacteriaceae species NOT DETECTED NOT DETECTED Final   Enterobacter cloacae complex NOT DETECTED NOT DETECTED Final   Escherichia coli NOT DETECTED NOT DETECTED Final   Klebsiella oxytoca NOT DETECTED NOT DETECTED Final   Klebsiella pneumoniae NOT DETECTED NOT  DETECTED Final   Proteus species NOT DETECTED NOT DETECTED Final   Serratia marcescens NOT DETECTED NOT DETECTED Final   Haemophilus influenzae NOT DETECTED NOT DETECTED Final   Neisseria meningitidis NOT DETECTED NOT DETECTED Final   Pseudomonas aeruginosa NOT DETECTED NOT DETECTED Final   Candida albicans NOT DETECTED NOT DETECTED Final   Candida glabrata NOT DETECTED NOT DETECTED Final   Candida krusei NOT DETECTED NOT DETECTED Final   Candida parapsilosis NOT DETECTED NOT DETECTED Final   Candida tropicalis NOT DETECTED NOT DETECTED Final    Comment: Performed at [facility], [address] [place], [place] [address]  CULTURE, BLOOD (ROUTINE X 2) w Reflex to ID Panel     Status: None   Collection Time: 11/16/18 11:58 AM  Result Value Ref Range Status   Specimen Description BLOOD LEFT ARM  Final   Special Requests   Final    BOTTLES DRAWN AEROBIC AND ANAEROBIC Blood Culture adequate volume   Culture   Final    NO GROWTH 5 DAYS Performed at [facility], [address] [place], [place] [address]    Report Status 11/21/2018 FINAL  Final  CULTURE, BLOOD (ROUTINE X 2) w Reflex to ID Panel     Status: None   Collection  Time: 11/16/18 11:58 AM  Result Value Ref Range Status   Specimen Description BLOOD LEFT HAND  Final   Special Requests   Final    BOTTLES DRAWN AEROBIC AND ANAEROBIC Blood Culture adequate volume   Culture   Final    NO GROWTH 5 DAYS Performed at [facility], [address] [place], [place] [address]    Report Status 11/21/2018 FINAL  Final  Blood Culture (routine x 2)     Status: None (Preliminary result)   Collection Time: 11/23/18  3:20 AM  Result Value Ref Range Status   Specimen Description BLOOD RIGHT FOREARM  Final   Special Requests   Final    BOTTLES DRAWN AEROBIC AND ANAEROBIC Blood Culture adequate volume   Culture   Final    NO GROWTH < 12 HOURS Performed at [facility], [address], [place], [place] [address]    Report Status PENDING  Incomplete  Blood Culture (routine x 2)     Status: None (Preliminary result)   Collection Time: 11/23/18  3:20 AM  Result Value Ref Range Status   Specimen Description BLOOD LEFT ARM  Final   Special Requests   Final    BOTTLES DRAWN AEROBIC AND ANAEROBIC Blood Culture adequate volume   Culture   Final    NO GROWTH <12 HOURS Performed at [facility], [address] [place], [place] [address]    Report Status PENDING  Incomplete  MRSA PCR Screening     Status: None   Collection Time: 11/23/18  6:26 AM  Result Value Ref Range Status   MRSA by PCR NEGATIVE NEGATIVE Final    Comment:        The GeneXpert MRSA Assay (FDA approved for NASAL specimens only), is one component of a comprehensive MRSA colonization surveillance program. It is not intended to diagnose MRSA infection nor to guide or monitor treatment for MRSA infections. Performed at [facility], [address] [place],  [address]           IMAGING    Dg Chest 1 View  Result Date: 11/23/2018 CLINICAL DATA:  Initial evaluation status post intubation. EXAM: CHEST  1 VIEW COMPARISON:  Prior radiograph from  11/18/2018 FINDINGS: Examination technically limited by patient positioning, as the patient is markedly rotated to the left. Endotracheal tube in place with tip positioned 5.5 cm above the carina. Enteric tube courses into the abdomen, side hole beyond the GE junction. Stable cardiomegaly. Mediastinal silhouette grossly within normal limits allowing for technique and patient positioning. Low lung volumes. Veiling opacities overlying the bilateral lungs most consistent with effusions, left greater than right. Superimposed degree of pulmonary interstitial edema. More confluent bibasilar opacities could reflect atelectasis, consolidation, or edema, left greater than right with dense opacity within the retrocardiac left lower lobe. No pneumothorax. No acute osseous finding. IMPRESSION: 1. Tip of endotracheal tube position 5.5 cm above the carina. 2. Enteric tube in place overlying the left upper quadrant, side hole beyond the GE junction. 3. Pulmonary interstitial edema with moderate layering bilateral pleural effusions, left greater than right. Superimposed bibasilar opacities could reflect atelectasis or infiltrates, also worse on the left. Electronically Signed   By: Jeannine Boga M.D.   On: 11/23/2018 04:40   CBC    Component Value Date/Time   WBC 11.5 (H) 11/23/2018 0320   RBC 3.15 (L) 11/23/2018 0320   HGB 9.9 (L) 11/23/2018 0320   HGB 13.0 11/30/2012 1708   HCT 31.3 (L) 11/23/2018 0320   HCT 38.0 (L) 11/30/2012 1708   PLT 157 11/23/2018 0320   PLT 136 (L) 11/30/2012 1708   MCV 99.4 11/23/2018 0320   MCV 92 11/30/2012 1708   MCH 31.4 11/23/2018 0320   MCHC 31.6 11/23/2018 0320   RDW 17.9 (H) 11/23/2018 0320   RDW 14.6 (H) 11/30/2012 1708   LYMPHSABS 0.5 (L) 11/23/2018 0320   LYMPHSABS 2.4 11/30/2012 1708   MONOABS 0.2 11/23/2018 0320   MONOABS 0.8 11/30/2012 1708   EOSABS 0.1 11/23/2018 0320   EOSABS 0.2 11/30/2012 1708   BASOSABS 0.0 11/23/2018 0320   BASOSABS 0.1 11/30/2012 1708    BMP Latest Ref Rng & Units 11/23/2018 11/19/2018 11/18/2018  Glucose 70 - 99 mg/dL 130(H) 90 88  BUN 8 - 23 mg/dL 29(H) 25(H) 25(H)  Creatinine 0.61 - 1.24 mg/dL 1.39(H) 0.86 0.96  Sodium 135 - 145 mmol/L 141 140 140  Potassium 3.5 - 5.1 mmol/L 4.4 3.8 3.8  Chloride 98 - 111 mmol/L 100 101 99  CO2 22 - 32 mmol/L 31 31 33(H)  Calcium 8.9 - 10.3 mg/dL 8.5(L) 8.5(L) 8.6(L)        Indwelling Urinary Catheter continued, requirement due to   Reason to continue Indwelling Urinary Catheter for strict Intake/Output monitoring for hemodynamic instability         Ventilator continued, requirement due to, resp failure    Ventilator Sedation RASS 0 to -2     ASSESSMENT AND PLAN SYNOPSIS   Severe Hypoxic and Hypercapnic Respiratory Failure Pneumonia R/O COVID  -continue Full MV support -continue Bronchodilator Therapy -Wean Fio2 and PEEP as tolerated -will perform SAT/SBT when respiratory parameters are met   CARDIAC FAILURE-h/o CAD sCHF 35% -oxygen as needed -Lasix as tolerated -follow up cardiac enzymes as indicated   Renal Failure-most likely due to ATN -follow chem 7 -follow UO -continue Foley Catheter-assess need daily   NEUROLOGY - intubated and sedated - minimal sedation to achieve a RASS goal: -1 Wake up assessment pending   Septic shock -use vasopressors to keep MAP>65 -follow ABG and LA -follow up cultures -emperic ABX  CARDIAC ICU monitoring  ID -continue IV abx as prescibed -follow up cultures  GI/Nutrition GI  PROPHYLAXIS as indicated Constipation protocol as indicated  ENDO - ICU hypoglycemic\Hyperglycemia protocol -check FSBS per protocol   ELECTROLYTES -follow labs as needed -replace as needed -pharmacy consultation and following   DVT/GI PRX ordered TRANSFUSIONS AS NEEDED MONITOR FSBS ASSESS the need for LABS as needed   Critical Care Time devoted to patient care services described in this note is 38 minutes.   Overall,  patient is critically ill, prognosis is guarded.  Patient with Multiorgan failure and at high risk for cardiac arrest and death.    Corrin Parker, M.D.  [facility]  Medical Director [place] Director [facility] Cardio-Pulmonary Department

## 2018-11-23 NOTE — ED Triage Notes (Addendum)
Pt arrives from home, resp distress, aloc. Pt on non rebreather. Per ems pt with RA POX of 80%, per ems pt normally wears home oxygen, but oxygen was not in place. fsbs 113 per ems. Per ems family reported pt with fever, cough, shortness of breath. Pt does not arouse to painful stimuli.

## 2018-11-23 NOTE — ED Notes (Signed)
ED TO INPATIENT HANDOFF REPORT  ED Nurse Name and Phone #: Tynlee Bayle [phone]   S Name/Age/Gender [name] 83 y.o. male Room/Bed: ED11A/ED11A  Code Status   Code Status: Prior  Home/SNF/Other Home Patient oriented to: situation Is this baseline? No      Chief Complaint [facility] - Unresponsive  Triage Note Pt arrives from home, resp distress, aloc. Pt on non rebreather.    Allergies No Known Allergies  Level of Care/Admitting Diagnosis ED Disposition    ED Disposition Condition Comment   Admit  Hospital Area: [facility] [[id number]]  Level of Care: ICU [6]  Diagnosis: Acute respiratory failure with hypoxemia [facility]) [[id number]]  Admitting Physician: Arnaldo Natal [[id number]]  Attending Physician: Arnaldo Natal [[id number]]  Estimated length of stay: past midnight tomorrow  Certification:: I certify this patient will need inpatient services for at least 2 midnights  PT Class (Do Not Modify): Inpatient [101]  PT Acc Code (Do Not Modify): Private [1]       B Medical/Surgery History Past Medical History:  Diagnosis Date  . CAD (coronary artery disease)   . Cervicalgia   . CHF (congestive heart failure) (HCC)   . COPD (chronic obstructive pulmonary disease) (HCC)   . Diastolic heart failure (HCC)   . Foot drop, right   . History of kidney stones   . Hyperlipidemia    unspecified  . Hypertension   . Myocardial infarction (HCC)   . Osteoarthritis   . Shoulder pain, left   . Sleep apnea   . Tremor, essential    Past Surgical History:  Procedure Laterality Date  . APPLICATION OF WOUND VAC Right 11/13/2017   Procedure: APPLICATION OF WOUND VAC;  Surgeon: Annice Needy, MD;  Location: [facility] ORS;  Service: Vascular;  Laterality: Right;  . BACK SURGERY  06/2010  . CARDIAC CATHETERIZATION Left 04/30/2016   Procedure: Left Heart Cath and Coronary Angiography;  Surgeon: Laurier Nancy, MD;  Location: [facility] INVASIVE CV LAB;  Service: Cardiovascular;   Laterality: Left;  . COLONOSCOPY    . CORONARY ANGIOPLASTY    . CORONARY/GRAFT ACUTE MI REVASCULARIZATION N/A 06/21/2018   Procedure: Coronary/Graft Acute MI Revascularization;  Surgeon: Iran Ouch, MD;  Location: [facility] INVASIVE CV LAB;  Service: Cardiovascular;  Laterality: N/A;  . KNEE ARTHROPLASTY Left 06/01/2018   Procedure: COMPUTER ASSISTED TOTAL KNEE ARTHROPLASTY;  Surgeon: Donato Heinz, MD;  Location: [facility] ORS;  Service: Orthopedics;  Laterality: Left;  . KNEE ARTHROSCOPY Left   . KNEE SURGERY Left 1998  . LEFT HEART CATH AND CORONARY ANGIOGRAPHY N/A 06/21/2018   Procedure: LEFT HEART CATH AND CORONARY ANGIOGRAPHY;  Surgeon: Iran Ouch, MD;  Location: [facility] INVASIVE CV LAB;  Service: Cardiovascular;  Laterality: N/A;  . ORIF FEMUR FRACTURE Left 08/27/2018   Procedure: OPEN REDUCTION INTERNAL FIXATION (ORIF) SUPRACONDYLAR FEMUR FRACTURE ABOVE PROSTHESIS, QUADRICEPS REPAIR;  Surgeon: Kennedy Bucker, MD;  Location: [facility] ORS;  Service: Orthopedics;  Laterality: Left;  . TEE WITHOUT CARDIOVERSION N/A 11/18/2018   Procedure: TRANSESOPHAGEAL ECHOCARDIOGRAM (TEE);  Surgeon: Lamar Blinks, MD;  Location: [facility] ORS;  Service: Cardiovascular;  Laterality: N/A;  . TONSILLECTOMY    . WOUND DEBRIDEMENT Right 11/13/2017   Procedure: DEBRIDEMENT WOUND;  Surgeon: Annice Needy, MD;  Location: [facility] ORS;  Service: Vascular;  Laterality: Right;     A IV Location/Drains/Wounds Patient Lines/Drains/Airways Status   Active Line/Drains/Airways    Name:   Placement date:   Placement time:  Site:   Days:   Peripheral IV 11/23/18 Right Forearm   11/23/18    0310    Forearm   less than 1   Peripheral IV 11/23/18 Left Forearm   11/23/18    -    Forearm   less than 1   Peripheral IV 11/23/18 Left Antecubital   11/23/18    0320    Antecubital   less than 1   Peripheral IV 11/23/18 Right Hand   11/23/18    0401    Hand   less than 1   Negative Pressure Wound Therapy Leg Left   08/27/18    1438     -   88   NG/OG Tube Orogastric 18 Fr. Center mouth Aucultation   11/23/18    0340    Center mouth   less than 1   Urethral Catheter kenny, rn Straight-tip 16 Fr.   11/23/18    0336    Straight-tip   less than 1   Airway 7.5 mm   11/23/18    0319     less than 1   Incision (Closed) 06/01/18 Knee Left   06/01/18    1555     175   Incision (Closed) 08/27/18 Leg Left   08/27/18    1501     88   Pressure Injury 11/15/18   11/15/18    1530     8          Intake/Output Last 24 hours  Intake/Output Summary (Last 24 hours) at 11/23/2018 0416 Last data filed at 11/23/2018 0414 Gross per 24 hour  Intake 1050 ml  Output -  Net 1050 ml    Labs/Imaging Results for orders placed or performed during the hospital encounter of 11/23/18 (from the past 48 hour(s))  CBC with Differential     Status: Abnormal   Collection Time: 11/23/18  3:20 AM  Result Value Ref Range   WBC 11.5 (H) 4.0 - 10.5 K/uL   RBC 3.15 (L) 4.22 - 5.81 MIL/uL   Hemoglobin 9.9 (L) 13.0 - 17.0 g/dL   HCT 82.5 (L) 00.3 - 70.4 %   MCV 99.4 80.0 - 100.0 fL   MCH 31.4 26.0 - 34.0 pg   MCHC 31.6 30.0 - 36.0 g/dL   RDW 88.8 (H) 91.6 - 94.5 %   Platelets 157 150 - 400 K/uL   nRBC 0.0 0.0 - 0.2 %   Neutrophils Relative % 93 %   Neutro Abs 10.7 (H) 1.7 - 7.7 K/uL   Lymphocytes Relative 4 %   Lymphs Abs 0.5 (L) 0.7 - 4.0 K/uL   Monocytes Relative 1 %   Monocytes Absolute 0.2 0.1 - 1.0 K/uL   Eosinophils Relative 1 %   Eosinophils Absolute 0.1 0.0 - 0.5 K/uL   Basophils Relative 0 %   Basophils Absolute 0.0 0.0 - 0.1 K/uL   Immature Granulocytes 1 %   Abs Immature Granulocytes 0.07 0.00 - 0.07 K/uL    Comment: Performed at [facility], [address] [place], [place] [address]  Urinalysis, Complete w Microscopic     Status: Abnormal   Collection Time: 11/23/18  3:20 AM  Result Value Ref Range   Color, Urine YELLOW (A) YELLOW   APPearance CLEAR (A) CLEAR   Specific Gravity, Urine 1.014 1.005 - 1.030   pH 6.0 5.0  - 8.0   Glucose, UA NEGATIVE NEGATIVE mg/dL   Hgb urine dipstick NEGATIVE NEGATIVE   Bilirubin Urine NEGATIVE  NEGATIVE   Ketones, ur NEGATIVE NEGATIVE mg/dL   Protein, ur 30 (A) NEGATIVE mg/dL   Nitrite NEGATIVE NEGATIVE   Leukocytes,Ua NEGATIVE NEGATIVE   RBC / HPF 0-5 0 - 5 RBC/hpf   WBC, UA 0-5 0 - 5 WBC/hpf   Bacteria, UA NONE SEEN NONE SEEN   Squamous Epithelial / LPF 0-5 0 - 5   Mucus PRESENT    Hyaline Casts, UA PRESENT    Granular Casts, UA PRESENT     Comment: Performed at [facility], [address] [place], [place] [address]  Lactic acid, plasma     Status: None   Collection Time: 11/23/18  3:20 AM  Result Value Ref Range   Lactic Acid, Venous 1.5 0.5 - 1.9 mmol/L    Comment: Performed at [facility], [address] [place], [place] [address]  Blood gas, arterial ([facility] & [facility] ONLY)     Status: Abnormal   Collection Time: 11/23/18  3:20 AM  Result Value Ref Range   FIO2 1.00    Delivery systems VENTILATOR    Mode ASSIST CONTROL    VT 450 mL   LHR 16 resp/min   Peep/cpap 5.0 cm H20   pH, Arterial 7.49 (H) 7.350 - 7.450   pCO2 arterial 42 32.0 - 48.0 mmHg   pO2, Arterial 316 (H) 83.0 - 108.0 mmHg   Bicarbonate 32.0 (H) 20.0 - 28.0 mmol/L   Acid-Base Excess 7.8 (H) 0.0 - 2.0 mmol/L   O2 Saturation 99.9 %   Patient temperature 37.0    Collection site RIGHT RADIAL    Sample type ARTERIAL DRAW    Allens test (pass/fail) PASS PASS    Comment: Performed at [facility], [address], [name], [place] [address]   No results found.  Pending Labs Unresulted Labs (From admission, onward)    Start     Ordered   11/23/18 0328  Novel Coronavirus, NAA (hospital order; send-out to ref lab)  (Novel Coronavirus, NAA [facility] Order; send-out to ref lab) with precautions panel)  Once,   STAT    Question Answer Comment  Current symptoms Fever and Shortness of breath   Excluded other viral illnesses Yes   Exposure Risk None   Patient immune  status Normal      11/23/18 0328   11/23/18 0326  CBC WITH DIFFERENTIAL  ONCE - STAT,   STAT     11/23/18 0327   11/23/18 0326  Blood Culture (routine x 2)  BLOOD CULTURE X 2,   STAT     11/23/18 0327   11/23/18 0326  Brain natriuretic peptide  ONCE - STAT,   STAT     11/23/18 0327   11/23/18 0326  Urine culture  ONCE - STAT,   STAT     11/23/18 0327   11/23/18 0320  Procalcitonin  Once,   STAT     11/23/18 0320   11/23/18 0315  Lactic acid, plasma  Now then every 2 hours,   STAT     11/23/18 0315   11/23/18 0314  Comprehensive metabolic panel  ONCE - STAT,   STAT     11/23/18 0315   11/23/18 0314  Troponin I - ONCE - STAT  ONCE - STAT,   STAT     11/23/18 0315   Signed and Held  Creatinine, serum  (enoxaparin (LOVENOX)    CrCl >/= 30 ml/min)  Weekly,   R    Comments:  while on enoxaparin therapy  Signed and Held   Signed and Held  TSH  Add-on,   R     Signed and Held   Signed and Held  Troponin I - Now Then Q6H  Now then every 6 hours,   R     Signed and Held          Vitals/Pain Today's Vitals   11/23/18 0345 11/23/18 0346 11/23/18 0400 11/23/18 0415  BP: (!) 78/46 (!) 78/46 (!) 105/59   Pulse: (!) 57 60 (!) 54   Resp: (!) 23 10 (!) 25   Temp:  (!) 97 F (36.1 C)  (!) 97 F (36.1 C)  TempSrc:  Axillary  Rectal  SpO2: 100% 90% 100%   Weight:  125 kg      Isolation Precautions Droplet and Contact precautions  Medications Medications  vancomycin (VANCOCIN) IVPB 1000 mg/200 mL premix (1,000 mg Intravenous New Bag/Given 11/23/18 0340)  propofol (DIPRIVAN) 1000 MG/100ML infusion (10 mcg/kg/min  124.4 kg Intravenous Rate/Dose Change 11/23/18 0336)  sodium chloride 0.9 % bolus 1,000 mL (0 mLs Intravenous Stopped 11/23/18 0414)    And  sodium chloride 0.9 % bolus 1,000 mL (1,000 mLs Intravenous New Bag/Given 11/23/18 0403)    And  sodium chloride 0.9 % bolus 400 mL (has no administration in time range)  vancomycin (VANCOCIN) IVPB 1000 mg/200 mL premix (has no  administration in time range)  norepinephrine (LEVOPHED) 4 mg in dextrose 5 % 250 mL (0.016 mg/mL) infusion (3 mcg/min Intravenous Rate/Dose Change 11/23/18 0355)  furosemide (LASIX) injection 60 mg (has no administration in time range)  succinylcholine (ANECTINE) injection 200 mg (200 mg Intravenous Given 11/23/18 0310)  etomidate (AMIDATE) injection 20 mg (20 mg Intravenous Given 11/23/18 0310)  piperacillin-tazobactam (ZOSYN) IVPB 3.375 g (0 g Intravenous Stopped 11/23/18 0358)  ceFEPIme (MAXIPIME) 2 g in sodium chloride 0.9 % 100 mL IVPB (2 g Intravenous New Bag/Given 11/23/18 0340)    Mobility non-ambulatory     Focused Assessments Cardiac Assessment Handoff:  Cardiac Rhythm: Sinus bradycardia Lab Results  Component Value Date   TROPONINI 0.06 (HH) 11/15/2018   No results found for: DDIMER Does the Patient currently have chest pain? No     R Recommendations: See Admitting Provider Note  Report given to:   Additional Notes:

## 2018-11-23 NOTE — H&P (Signed)
[name] is an 83 y.o. male.   Chief Complaint: Shortness of breath HPI: The patient with past medical history significant for CAD, hypertension and recent discharge from the hospital on 11/19/2018 for acute on chronic combined heart failure exacerbation and bacteremia presents to the emergency department via EMS due to shortness of breath.  The patient was in respiratory distress and was placed on nonrebreather.  Work of breathing and oxygenation required intubation upon arrival.  The patient's son reports that his father had been febrile to 41 F yesterday and has been coughing.  Chest x-ray shows pulmonary edema as well as possible bilateral basilar opacities.  Sepsis protocol was initiated as well as COVID-19 testing.  The patient also developed hypotension which prompted initiation of pressor support prior to the emergency department staff calling the hospitalist service for admission.  Past Medical History:  Diagnosis Date  . CAD (coronary artery disease)   . Cervicalgia   . CHF (congestive heart failure) (HCC)   . COPD (chronic obstructive pulmonary disease) (HCC)   . Diastolic heart failure (HCC)   . Foot drop, right   . History of kidney stones   . Hyperlipidemia    unspecified  . Hypertension   . Myocardial infarction (HCC)   . Osteoarthritis   . Shoulder pain, left   . Sleep apnea   . Tremor, essential     Past Surgical History:  Procedure Laterality Date  . APPLICATION OF WOUND VAC Right 11/13/2017   Procedure: APPLICATION OF WOUND VAC;  Surgeon: Annice Needy, MD;  Location: [facility] ORS;  Service: Vascular;  Laterality: Right;  . BACK SURGERY  06/2010  . CARDIAC CATHETERIZATION Left 04/30/2016   Procedure: Left Heart Cath and Coronary Angiography;  Surgeon: Laurier Nancy, MD;  Location: [facility] INVASIVE CV LAB;  Service: Cardiovascular;  Laterality: Left;  . COLONOSCOPY    . CORONARY ANGIOPLASTY    . CORONARY/GRAFT ACUTE MI REVASCULARIZATION N/A 06/21/2018   Procedure:  Coronary/Graft Acute MI Revascularization;  Surgeon: Iran Ouch, MD;  Location: [facility] INVASIVE CV LAB;  Service: Cardiovascular;  Laterality: N/A;  . KNEE ARTHROPLASTY Left 06/01/2018   Procedure: COMPUTER ASSISTED TOTAL KNEE ARTHROPLASTY;  Surgeon: Donato Heinz, MD;  Location: [facility] ORS;  Service: Orthopedics;  Laterality: Left;  . KNEE ARTHROSCOPY Left   . KNEE SURGERY Left 1998  . LEFT HEART CATH AND CORONARY ANGIOGRAPHY N/A 06/21/2018   Procedure: LEFT HEART CATH AND CORONARY ANGIOGRAPHY;  Surgeon: Iran Ouch, MD;  Location: [facility] INVASIVE CV LAB;  Service: Cardiovascular;  Laterality: N/A;  . ORIF FEMUR FRACTURE Left 08/27/2018   Procedure: OPEN REDUCTION INTERNAL FIXATION (ORIF) SUPRACONDYLAR FEMUR FRACTURE ABOVE PROSTHESIS, QUADRICEPS REPAIR;  Surgeon: Kennedy Bucker, MD;  Location: [facility] OR[facility];  Service: Orthopedics;  Laterality: Left;  . TEE WITHOUT CARDIOVERSION N/A 11/18/2018   Procedure: TRANSESOPHAGEAL ECHOCARDIOGRAM (TEE);  Surgeon: Lamar Blinks, MD;  Location: [facility] ORS;  Service: Cardiovascular;  Laterality: N/A;  . TONSILLECTOMY    . WOUND DEBRIDEMENT Right 11/13/2017   Procedure: DEBRIDEMENT WOUND;  Surgeon: Annice Needy, MD;  Location: [facility] ORS;  Service: Vascular;  Laterality: Right;    Family History  Problem Relation Age of Onset  . Diabetes Son   . Cancer Mother   . Colon cancer Mother   . Lung cancer Mother   . Tremor Father   . Heart disease Father   . Tremor Brother   . Bladder Cancer Brother   . Tremor Sister  Social History:  reports that he has quit smoking. His smoking use included cigarettes. He has a 14.50 pack-year smoking history. He has quit using smokeless tobacco. He reports that he does not drink alcohol or use drugs.  Allergies: No Known Allergies  Medications Prior to Admission  Medication Sig Dispense Refill  . acetaminophen (TYLENOL) 500 MG tablet Take 1,000 mg by mouth 2 (two) times daily as needed for moderate pain.    Marland Kitchen alum  & mag hydroxide-simeth (MAALOX PLUS) 400-400-40 MG/5ML suspension Take 30 mLs by mouth every 4 (four) hours as needed for indigestion.    . Ascorbic Acid (VITAMIN C PO) Take 500 mg by mouth daily.     Marland Kitchen aspirin EC 81 MG tablet Take 81 mg by mouth daily.    Marland Kitchen atorvastatin (LIPITOR) 80 MG tablet Take 80 mg by mouth daily.     . benzonatate (TESSALON PERLES) 100 MG capsule Take 2 capsules (200 mg total) by mouth 3 (three) times daily as needed for cough. 20 capsule 0  . bisacodyl (DULCOLAX) 10 MG suppository Place 10 mg rectally as needed for moderate constipation.    . carvedilol (COREG) 6.25 MG tablet Take 6.25 mg by mouth 2 (two) times daily with a meal.    . Cholecalciferol 100 MCG (4000 UT) TABS Take 1 tablet by mouth daily.    . clopidogrel (PLAVIX) 75 MG tablet Take 75 mg by mouth daily.    Marland Kitchen dextromethorphan (DELSYM) 30 MG/5ML liquid Take 5 mLs (30 mg total) by mouth 2 (two) times daily as needed for cough. 100 mL 0  . dextromethorphan-guaiFENesin (MUCINEX DM) 30-600 MG 12hr tablet Take 1 tablet by mouth 2 (two) times daily as needed for cough.    . furosemide (LASIX) 40 MG tablet Take 1 tablet (40 mg total) by mouth 2 (two) times daily for 30 days. 60 tablet 0  . gabapentin (NEURONTIN) 400 MG capsule Take 400 mg by mouth at bedtime.    Marland Kitchen ipratropium-albuterol (DUONEB) 0.5-2.5 (3) MG/3ML SOLN Take 3 mLs by nebulization every 6 (six) hours as needed (shortness of breath).     . isosorbide mononitrate (IMDUR) 30 MG 24 hr tablet Take 30 mg by mouth daily.     Marland Kitchen ketotifen (ZADITOR) 0.025 % ophthalmic solution Place 1 drop into both eyes 2 (two) times daily. 5 mL 0  . loratadine (CLARITIN) 10 MG tablet Take 10 mg by mouth daily as needed for allergies.    Marland Kitchen LORazepam (ATIVAN) 0.5 MG tablet Take 0.5 mg by mouth 2 (two) times daily.    . magnesium hydroxide (MILK OF MAGNESIA) 400 MG/5ML suspension Take 30 mLs by mouth every 4 (four) hours as needed for mild constipation (No BM in 2 days).    .  Melatonin 3 MG TABS Take 1 tablet by mouth at bedtime.    . Multiple Vitamins-Minerals (CENTROVITE) TABS Take 1 tablet by mouth daily.    . nitroGLYCERIN (NITROSTAT) 0.3 MG SL tablet Place 0.3 mg under the tongue every 5 (five) minutes as needed for chest pain.    . polyethylene glycol (MIRALAX / GLYCOLAX) packet Take 17 g by mouth 2 (two) times daily as needed. Hold for loose stools.    . potassium chloride SA (K-DUR,KLOR-CON) 20 MEQ tablet Take 20 mEq by mouth 2 (two) times daily.     . primidone (MYSOLINE) 50 MG tablet Take 200 mg by mouth 2 (two) times daily.     Marland Kitchen saccharomyces boulardii (FLORASTOR) 250 MG capsule Take  250 mg by mouth 2 (two) times daily.    . sacubitril-valsartan (ENTRESTO) 24-26 MG Take 1 tablet by mouth 2 (two) times daily.    . sennosides-docusate sodium (SENOKOT-S) 8.6-50 MG tablet Take 2 tablets by mouth 2 (two) times daily as needed for constipation.     . Skin Protectants, Misc. (ENDIT EX) Apply to bottom and moisture associated breakdown of coccyx area BID and as need    . spironolactone (ALDACTONE) 25 MG tablet Take 12.5 mg by mouth daily.     . tamsulosin (FLOMAX) 0.4 MG CAPS capsule Take 1 capsule (0.4 mg total) by mouth daily. 30 capsule 0  . umeclidinium-vilanterol (ANORO ELLIPTA) 62.5-25 MCG/INH AEPB Inhale 1 puff into the lungs daily.      Results for orders placed or performed during the hospital encounter of 11/23/18 (from the past 48 hour(s))  CBC with Differential     Status: Abnormal   Collection Time: 11/23/18  3:20 AM  Result Value Ref Range   WBC 11.5 (H) 4.0 - 10.5 K/uL   RBC 3.15 (L) 4.22 - 5.81 MIL/uL   Hemoglobin 9.9 (L) 13.0 - 17.0 g/dL   HCT 40.9 (L) 81.1 - 91.4 %   MCV 99.4 80.0 - 100.0 fL   MCH 31.4 26.0 - 34.0 pg   MCHC 31.6 30.0 - 36.0 g/dL   RDW 78.2 (H) 95.6 - 21.3 %   Platelets 157 150 - 400 K/uL   nRBC 0.0 0.0 - 0.2 %   Neutrophils Relative % 93 %   Neutro Abs 10.7 (H) 1.7 - 7.7 K/uL   Lymphocytes Relative 4 %   Lymphs Abs  0.5 (L) 0.7 - 4.0 K/uL   Monocytes Relative 1 %   Monocytes Absolute 0.2 0.1 - 1.0 K/uL   Eosinophils Relative 1 %   Eosinophils Absolute 0.1 0.0 - 0.5 K/uL   Basophils Relative 0 %   Basophils Absolute 0.0 0.0 - 0.1 K/uL   Immature Granulocytes 1 %   Abs Immature Granulocytes 0.07 0.00 - 0.07 K/uL    Comment: Performed at [facility], [address] [place], [place] [address]  Urinalysis, Complete w Microscopic     Status: Abnormal   Collection Time: 11/23/18  3:20 AM  Result Value Ref Range   Color, Urine YELLOW (A) YELLOW   APPearance CLEAR (A) CLEAR   Specific Gravity, Urine 1.014 1.005 - 1.030   pH 6.0 5.0 - 8.0   Glucose, UA NEGATIVE NEGATIVE mg/dL   Hgb urine dipstick NEGATIVE NEGATIVE   Bilirubin Urine NEGATIVE NEGATIVE   Ketones, ur NEGATIVE NEGATIVE mg/dL   Protein, ur 30 (A) NEGATIVE mg/dL   Nitrite NEGATIVE NEGATIVE   Leukocytes,Ua NEGATIVE NEGATIVE   RBC / HPF 0-5 0 - 5 RBC/hpf   WBC, UA 0-5 0 - 5 WBC/hpf   Bacteria, UA NONE SEEN NONE SEEN   Squamous Epithelial / LPF 0-5 0 - 5   Mucus PRESENT    Hyaline Casts, UA PRESENT    Granular Casts, UA PRESENT     Comment: Performed at [facility], [address] [place], [place] [address]  Lactic acid, plasma     Status: None   Collection Time: 11/23/18  3:20 AM  Result Value Ref Range   Lactic Acid, Venous 1.5 0.5 - 1.9 mmol/L    Comment: Performed at [facility], [address] [place], [place] [address]  Blood gas, arterial ([facility] & [facility] ONLY)     Status: Abnormal  Collection Time: 11/23/18  3:20 AM  Result Value Ref Range   FIO2 1.00    Delivery systems VENTILATOR    Mode ASSIST CONTROL    VT 450 mL   LHR 16 resp/min   Peep/cpap 5.0 cm H20   pH, Arterial 7.49 (H) 7.350 - 7.450   pCO2 arterial 42 32.0 - 48.0 mmHg   pO2, Arterial 316 (H) 83.0 - 108.0 mmHg   Bicarbonate 32.0 (H) 20.0 - 28.0 mmol/L   Acid-Base Excess 7.8 (H) 0.0 - 2.0 mmol/L   O2 Saturation 99.9 %   Patient  temperature 37.0    Collection site RIGHT RADIAL    Sample type ARTERIAL DRAW    Allens test (pass/fail) PASS PASS    Comment: Performed at [facility], [address], [place], [place] [address]   No results found.  Review of Systems  Unable to perform ROS: Intubated    Blood pressure (!) 105/59, pulse (!) 54, temperature (!) 97 F (36.1 C), temperature source Axillary, resp. rate (!) 25, weight 125 kg, SpO2 100 %. Physical Exam  Vitals reviewed. Constitutional: He appears well-developed and well-nourished. No distress. He is intubated.  HENT:  Head: Normocephalic and atraumatic.  Mouth/Throat: Oropharynx is clear and moist.  Eyes: Pupils are equal, round, and reactive to light. Conjunctivae and EOM are normal. No scleral icterus.  Neck: Normal range of motion. Neck supple. No JVD present. No tracheal deviation present. No thyromegaly present.  Cardiovascular: Normal rate, regular rhythm and normal heart sounds. Exam reveals no gallop and no friction rub.  No murmur heard. Respiratory: He is intubated.  GI: Soft. Bowel sounds are normal. He exhibits no distension. There is no abdominal tenderness.  Genitourinary:    Genitourinary Comments: Deferred   Musculoskeletal:        General: No deformity or edema.  Lymphadenopathy:    He has no cervical adenopathy.  Neurological:  Intubated, sedated, and pharacologically paralyzed  Skin: Skin is warm and dry. No rash noted. No erythema.  Psychiatric:  Intubated and sedated     Assessment/Plan This is an 83 year old male admitted for acute respiratory failure. 1.  Respiratory failure: Acute; with hypoxemia.  Secondary to pulmonary edema plus or minus underlying inflammatory process.  Continue supplemental oxygen and mechanical ventilation.The patient is high risk for novel coronavirus infection.  The patient meets criteria for sepsis as well.  Cefepime and vancomycin started.  Notably, the patient had blood cultures  obtained on his previous admission that showed strep viridans as well as Rothia mucilaginosa (part of Acinetobacter family).  CTA at that time did not demonstrate pulmonary embolism, and TEE did not show endocarditis.  Blood culture presumed to be a contaminant.  Follow current blood cultures for growth and sensitivities.   2.  CHF: Acute on chronic; combined systolic and diastolic.  Last EF 30 to 35% with grade 2 diastolic dysfunction. I have given him a dose of Lasix 60 mg IV due to pulmonary edema.  Continue diuretic therapy as needed. 3.  Acute kidney injury: I have held [facility] as well as spironolactone.  Continue to monitor kidney function 4.  CAD: Stable; continue aspirin and Plavix 5.  Elevated troponin: Secondary to demand ischemia.  Likely due to sepsis.  Continue to follow cardiac biomarkers. 6.  DVT prophylaxis: Heparin 7.  GI prophylaxis: PPI following 24 hours of mechanical ventilation Patient is a full code.  I have personally spent 45 minutes in critical care time with this patient.  Sheryle Hail,  Kelton Pillar, MD 11/23/2018, 4:12 AM

## 2018-11-23 NOTE — Progress Notes (Signed)
CODE SEPSIS - PHARMACY COMMUNICATION  **Broad Spectrum Antibiotics should be administered within 1 hour of Sepsis diagnosis**  Time Code Sepsis Called/Page Received: @ 0329  Antibiotics Ordered: Zosyn, vancomycin   Time of 1st antibiotic administration: @0328   Additional action taken by pharmacy: N/A  If necessary, Name of Provider/Nurse Contacted: N/A   Gardner Candle, PharmD, BCPS Clinical Pharmacist 11/23/2018 3:56 AM

## 2018-11-23 NOTE — ED Notes (Signed)
Report to melissa, rn 

## 2018-11-23 NOTE — Progress Notes (Signed)
Attempted to wean sedation off of patient unsuccessfully. Patient began to desaturate and bite the ETT and became agitated.  Sedation restarted at prior running rate.

## 2018-11-23 NOTE — Consult Note (Addendum)
Pharmacy Antibiotic Note  Dylan [name] is a 83 y.o. male admitted on 11/23/2018 with sepsis.  Pharmacy has been consulted for Cefepime and vancomycin dosing. Patient was recently admitted (Discharged 3/19) with acute CHF and lower respiratory infection.   Plan: Vancomycin 2g IV x 1 loading dose, followed by Vancomycin 1250 mg IV Q 24 hrs to start @ 1800. Goal AUC 400-550. Expected AUC: 472 SCr used: 1.39  Start Cefepime 2g IV every 8 hours.    Weight: 275 lb 9.2 oz (125 kg)  Temp (24hrs), Avg:97 F (36.1 C), Min:97 F (36.1 C), Max:97 F (36.1 C)  Recent Labs  Lab 11/17/18 0422 11/18/18 0543 11/19/18 0428 11/23/18 0320  WBC 7.3 5.7 5.3 11.5*  CREATININE 0.95 0.96 0.86 1.39*  LATICACIDVEN  --   --   --  1.5    Estimated Creatinine Clearance: 56.9 mL/min (A) (by C-G formula based on SCr of 1.39 mg/dL (H)).    No Known Allergies  Antimicrobials this admission: 3/23 Cefepime >>  3/23 vancomycin >>   Microbiology results: 3/23 BCx: pending 3/23 UCx: pending  3/23 MRSA PCR: pending  Thank you for allowing pharmacy to be a part of this patient's care.  Gardner Candle, PharmD, BCPS Clinical Pharmacist 11/23/2018 4:43 AM

## 2018-11-23 NOTE — Progress Notes (Signed)
eLink Physician-Brief Progress Note Patient Name: Dylan [name] DOB: 1935/12/29 MRN: [id number]   Date of Service  11/23/2018  HPI/Events of Note  83 yo male with PMH CAD, CHF and Pneumonia. Present with respirtory distress. Now intubated and ventilated. On Norepinephrine IV infusion for hemodynamic support. Note: COVID R/O.  PCCM asked to assume care in the ICU. VSS.  eICU Interventions  No new orders.      Intervention Category Evaluation Type: New Patient Evaluation  [name] Eugene 11/23/2018, 6:00 AM

## 2018-11-23 NOTE — ED Provider Notes (Signed)
[facility] Emergency Department Provider Note   ____________________________________________   First MD Initiated Contact with Patient 11/23/18 [phone]     (approximate)  I have reviewed the triage vital signs and the nursing notes.   HISTORY  Chief Complaint Respiratory failure  Level V caveat: Limited by unresponsiveness  HPI Dylan [name] is a 83 y.o. male brought to the ED from home via EMS for unresponsive and respiratory failure.  Patient has a history of CHF, COPD who wears only 1 L nasal cannula oxygen as needed.  Patient recently had a hospitalization from 3/13 to 3/19 for CHF exacerbation.  Family told EMS he called his doctor yesterday for progressive shortness of breath and was told to take an antibiotic that he was previously prescribed and to "avoid the ER".  When EMS arrived tonight oxygen was off of the patient unbeknownst to the family.  Room air saturations 80% upon their arrival.  EMS reports fever per family.  Patient arrives to the ED unresponsive with labored breathing on a nonrebreather with mask in place.  This is in the setting of current coronavirus pandemic.        Past Medical History:  Diagnosis Date  . CAD (coronary artery disease)   . Cervicalgia   . CHF (congestive heart failure) (HCC)   . COPD (chronic obstructive pulmonary disease) (HCC)   . Diastolic heart failure (HCC)   . Foot drop, right   . History of kidney stones   . Hyperlipidemia    unspecified  . Hypertension   . Myocardial infarction (HCC)   . Osteoarthritis   . Shoulder pain, left   . Sleep apnea   . Tremor, essential     Patient Active Problem List   Diagnosis Date Noted  . Acute respiratory failure with hypoxemia (HCC) 11/23/2018  . Goals of care, counseling/discussion   . Palliative care by specialist   . DNR (do not resuscitate) discussion   . Acute CHF (congestive heart failure) (HCC) 11/14/2018  . Acute on chronic systolic CHF (congestive  heart failure) (HCC) 10/27/2018  . HCAP (healthcare-associated pneumonia) 10/27/2018  . Hypokalemia 10/01/2018  . Hypertensive heart and kidney disease with acute on chronic diastolic congestive heart failure and stage 3 chronic kidney disease (HCC) 09/09/2018  . Chronic constipation 09/09/2018  . BPH with obstruction/lower urinary tract symptoms 09/09/2018  . Obstructive sleep apnea of adult 09/09/2018  . Idiopathic peripheral neuropathy 09/09/2018  . Chronic kidney disease, stage 3, mod decreased GFR (HCC) 09/09/2018  . Anemia of chronic renal failure, stage 3 (moderate) (HCC) 09/09/2018  . Periprosthetic fracture around internal prosthetic left knee joint 08/26/2018  . Acute ST elevation myocardial infarction (STEMI) of inferior wall (HCC) 06/21/2018  . S/P total knee arthroplasty 06/01/2018  . Primary osteoarthritis of left knee 02/05/2018  . Sepsis (HCC) 01/10/2018  . Community acquired pneumonia 12/01/2017  . Pneumonia 12/01/2017  . Hyperlipidemia 09/23/2017  . Weakness 08/31/2017  . Benign essential tremor 10/18/2016  . Class 3 severe obesity due to excess calories with serious comorbidity and body mass index (BMI) of 45.0 to 49.9 in adult (HCC) 10/18/2016  . Varicose veins of lower extremities with ulcer (HCC) 06/17/2016  . Chronic venous insufficiency 06/17/2016  . Lymphedema 06/17/2016  . Acute on chronic diastolic heart failure (HCC) 06/10/2016  . COPD, severe (HCC) 05/24/2016  . AV block, Mobitz 1 02/09/2016  . Coronary atherosclerosis of native coronary artery 02/09/2016  . ST elevation MI (STEMI) (HCC) 02/08/2016  .  Right foot drop 04/07/2014  . Rotator cuff rupture, complete 04/22/2013  . Neck pain 04/14/2013    Past Surgical History:  Procedure Laterality Date  . APPLICATION OF WOUND VAC Right 11/13/2017   Procedure: APPLICATION OF WOUND VAC;  Surgeon: Annice Needy, MD;  Location: [facility] ORS;  Service: Vascular;  Laterality: Right;  . BACK SURGERY  06/2010  .  CARDIAC CATHETERIZATION Left 04/30/2016   Procedure: Left Heart Cath and Coronary Angiography;  Surgeon: Laurier Nancy, MD;  Location: [facility] INVASIVE CV LAB;  Service: Cardiovascular;  Laterality: Left;  . COLONOSCOPY    . CORONARY ANGIOPLASTY    . CORONARY/GRAFT ACUTE MI REVASCULARIZATION N/A 06/21/2018   Procedure: Coronary/Graft Acute MI Revascularization;  Surgeon: Iran Ouch, MD;  Location: [facility] INVASIVE CV LAB;  Service: Cardiovascular;  Laterality: N/A;  . KNEE ARTHROPLASTY Left 06/01/2018   Procedure: COMPUTER ASSISTED TOTAL KNEE ARTHROPLASTY;  Surgeon: Donato Heinz, MD;  Location: [facility] ORS;  Service: Orthopedics;  Laterality: Left;  . KNEE ARTHROSCOPY Left   . KNEE SURGERY Left 1998  . LEFT HEART CATH AND CORONARY ANGIOGRAPHY N/A 06/21/2018   Procedure: LEFT HEART CATH AND CORONARY ANGIOGRAPHY;  Surgeon: Iran Ouch, MD;  Location: [facility] INV[facility]IVE CV LAB;  Service: Cardiovascular;  Laterality: N/A;  . ORIF FEMUR FRACTURE Left 08/27/2018   Procedure: OPEN REDUCTION INTERNAL FIXATION (ORIF) SUPRACONDYLAR FEMUR FRACTURE ABOVE PROSTHESIS, QUADRICEPS REPAIR;  Surgeon: Kennedy Bucker, MD;  Location: [facility] ORS;  Service: Orthopedics;  Laterality: Left;  . TEE WITHOUT CARDIOVERSION N/A 11/18/2018   Procedure: TRANSESOPHAGEAL ECHOCARDIOGRAM (TEE);  Surgeon: Lamar Blinks, MD;  Location: [facility] ORS;  Service: Cardiovascular;  Laterality: N/A;  . TONSILLECTOMY    . WOUND DEBRIDEMENT Right 11/13/2017   Procedure: DEBRIDEMENT WOUND;  Surgeon: Annice Needy, MD;  Location: [facility] ORS;  Service: Vascular;  Laterality: Right;    Prior to Admission medications   Medication Sig Start Date End Date Taking? Authorizing Provider  acetaminophen (TYLENOL) 500 MG tablet Take 1,000 mg by mouth 2 (two) times daily as needed for moderate pain.    [provider]  alum & mag hydroxide-simeth (MAALOX PLUS) 400-400-40 MG/5ML suspension Take 30 mLs by mouth every 4 (four) hours as needed for  indigestion. 09/04/18   [provider]  Ascorbic Acid (VITAMIN C PO) Take 500 mg by mouth daily.     [provider]  aspirin EC 81 MG tablet Take 81 mg by mouth daily.    [provider]  atorvastatin (LIPITOR) 80 MG tablet Take 80 mg by mouth daily.  09/04/18   [provider]  benzonatate (TESSALON PERLES) 100 MG capsule Take 2 capsules (200 mg total) by mouth 3 (three) times daily as needed for cough. 10/31/18   [name], [name], MD  bisacodyl (DULCOLAX) 10 MG suppository Place 10 mg rectally as needed for moderate constipation.    [provider]  carvedilol (COREG) 6.25 MG tablet Take 6.25 mg by mouth 2 (two) times daily with a meal. 09/07/18   [provider]  Cholecalciferol 100 MCG (4000 UT) TABS Take 1 tablet by mouth daily. 09/07/18   [provider]  clopidogrel (PLAVIX) 75 MG tablet Take 75 mg by mouth daily.    [provider]  dextromethorphan (DELSYM) 30 MG/5ML liquid Take 5 mLs (30 mg total) by mouth 2 (two) times daily as needed for cough. 10/31/18   [name], Jetty Duhamel D, MD  dextromethorphan-guaiFENesin (MUCINEX DM) 30-600 MG 12hr tablet Take 1 tablet  by mouth 2 (two) times daily as needed for cough.    [provider]  furosemide (LASIX) 40 MG tablet Take 1 tablet (40 mg total) by mouth 2 (two) times daily for 30 days. 11/19/18 12/19/18  Stormy Fabian, PA  gabapentin (NEURONTIN) 400 MG capsule Take 400 mg by mouth at bedtime.    [provider]  ipratropium-albuterol (DUONEB) 0.5-2.5 (3) MG/3ML SOLN Take 3 mLs by nebulization every 6 (six) hours as needed (shortness of breath).     [provider]  isosorbide mononitrate (IMDUR) 30 MG 24 hr tablet Take 30 mg by mouth daily.  05/31/16   [provider]  ketotifen (ZADITOR) 0.025 % ophthalmic solution Place 1 drop into both eyes 2 (two) times daily. 01/14/18   Alford Highland, MD  loratadine (CLARITIN) 10 MG tablet Take 10 mg by mouth daily  as needed for allergies.    [provider]  LORazepam (ATIVAN) 0.5 MG tablet Take 0.5 mg by mouth 2 (two) times daily. 11/05/18   [provider]  magnesium hydroxide (MILK OF MAGNESIA) 400 MG/5ML suspension Take 30 mLs by mouth every 4 (four) hours as needed for mild constipation (No BM in 2 days).    [provider]  Melatonin 3 MG TABS Take 1 tablet by mouth at bedtime. 09/10/18   [provider]  Multiple Vitamins-Minerals (CENTROVITE) TABS Take 1 tablet by mouth daily.    [provider]  nitroGLYCERIN (NITROSTAT) 0.3 MG SL tablet Place 0.3 mg under the tongue every 5 (five) minutes as needed for chest pain.    [provider]  polyethylene glycol (MIRALAX / GLYCOLAX) packet Take 17 g by mouth 2 (two) times daily as needed. Hold for loose stools. 09/29/18   [provider]  potassium chloride SA (K-DUR,KLOR-CON) 20 MEQ tablet Take 20 mEq by mouth 2 (two) times daily.     [provider]  primidone (MYSOLINE) 50 MG tablet Take 200 mg by mouth 2 (two) times daily.     [provider]  saccharomyces boulardii (FLORASTOR) 250 MG capsule Take 250 mg by mouth 2 (two) times daily. 10/26/18 12/04/18  [provider]  sacubitril-valsartan (ENTRESTO) 24-26 MG Take 1 tablet by mouth 2 (two) times daily.    [provider]  sennosides-docusate sodium (SENOKOT-S) 8.6-50 MG tablet Take 2 tablets by mouth 2 (two) times daily as needed for constipation.  09/07/18   [provider]  Skin Protectants, Misc. (ENDIT EX) Apply to bottom and moisture associated breakdown of coccyx area BID and as need 09/15/18   [provider]  spironolactone (ALDACTONE) 25 MG tablet Take 12.5 mg by mouth daily.  09/04/18   [provider]  tamsulosin (FLOMAX) 0.4 MG CAPS capsule Take 1 capsule (0.4 mg total) by mouth daily. 09/01/18   Evon Slack, PA-C  umeclidinium-vilanterol (ANORO ELLIPTA) 62.5-25 MCG/INH AEPB  Inhale 1 puff into the lungs daily. 09/04/17   Shaune Pollack, MD    Allergies Patient has no known allergies.  Family History  Problem Relation Age of Onset  . Diabetes Son   . Cancer Mother   . Colon cancer Mother   . Lung cancer Mother   . Tremor Father   . Heart disease Father   . Tremor Brother   . Bladder Cancer Brother   . Tremor Sister     Social History Social History   Tobacco Use  . Smoking status: Former Smoker    Packs/day: 0.25  Years: 58.00    Pack years: 14.50    Types: Cigarettes  . Smokeless tobacco: Former Neurosurgeon  . Tobacco comment: quit the begginning of septembet 2019  Substance Use Topics  . Alcohol use: No    Alcohol/week: 0.0 standard drinks  . Drug use: No    Review of Systems  Constitutional: No fever/chills Eyes: No visual changes. ENT: No sore throat. Cardiovascular: Denies chest pain. Respiratory: Positive for shortness of breath. Gastrointestinal: No abdominal pain.  No nausea, no vomiting.  No diarrhea.  No constipation. Genitourinary: Negative for dysuria. Musculoskeletal: Negative for back pain. Skin: Negative for rash. Neurological: Negative for headaches, focal weakness or numbness.   ____________________________________________   PHYSICAL EXAM:  VITAL SIGNS: ED Triage Vitals  Enc Vitals Group     BP      Pulse      Resp      Temp      Temp src      SpO2      Weight      Height      Head Circumference      Peak Flow      Pain Score      Pain Loc      Pain Edu?      Excl. in GC?     Constitutional: Unresponsive. Eyes: Conjunctivae are normal. PERRL. EOMI. Head: Atraumatic. Nose: No congestion/rhinnorhea. Mouth/Throat: Mucous membranes are moist.  Oropharynx non-erythematous. Neck: No stridor.   Cardiovascular: Normal rate, regular rhythm. Grossly normal heart sounds.  Good peripheral circulation. Respiratory: Labored breathing. Gastrointestinal: Soft and nontender. No distention. No abdominal bruits. No CVA  tenderness. Musculoskeletal: No lower extremity tenderness nor edema.  No joint effusions. Neurologic: Unresponsive.   Skin:  Skin is warm, dry and intact. No rash noted. Psychiatric: Unable to assess.  ____________________________________________   LABS (all labs ordered are listed, but only abnormal results are displayed)  Labs Reviewed  CBC WITH DIFFERENTIAL/PLATELET - Abnormal; Notable for the following components:      Result Value   WBC 11.5 (*)    RBC 3.15 (*)    Hemoglobin 9.9 (*)    HCT 31.3 (*)    RDW 17.9 (*)    Neutro Abs 10.7 (*)    Lymphs Abs 0.5 (*)    All other components within normal limits  COMPREHENSIVE METABOLIC PANEL - Abnormal; Notable for the following components:   Glucose, Bld 130 (*)    BUN 29 (*)    Creatinine, Ser 1.39 (*)    Calcium 8.5 (*)    Total Protein 5.8 (*)    Albumin 2.6 (*)    AST 63 (*)    ALT 50 (*)    GFR calc non Af Amer 47 (*)    GFR calc Af Amer 55 (*)    All other components within normal limits  TROPONIN I - Abnormal; Notable for the following components:   Troponin I 0.11 (*)    All other components within normal limits  URINALYSIS, COMPLETE (UACMP) WITH MICROSCOPIC - Abnormal; Notable for the following components:   Color, Urine YELLOW (*)    APPearance CLEAR (*)    Protein, ur 30 (*)    All other components within normal limits  BLOOD GAS, ARTERIAL - Abnormal; Notable for the following components:   pH, Arterial 7.49 (*)    pO2, Arterial 316 (*)    Bicarbonate 32.0 (*)    Acid-Base Excess 7.8 (*)    All other components within normal  limits  BRAIN NATRIURETIC PEPTIDE - Abnormal; Notable for the following components:   B Natriuretic Peptide 850.0 (*)    All other components within normal limits  GLUCOSE, CAPILLARY - Abnormal; Notable for the following components:   Glucose-Capillary 146 (*)    All other components within normal limits  CULTURE, BLOOD (ROUTINE X 2)  CULTURE, BLOOD (ROUTINE X 2)  URINE CULTURE   NOVEL CORONAVIRUS, NAA (HOSPITAL ORDER, SEND-OUT TO REF LAB)  MRSA PCR SCREENING  LACTIC ACID, PLASMA  PROCALCITONIN  INFLUENZA PANEL BY PCR (TYPE A & B)  CBC WITH DIFFERENTIAL/PLATELET  TSH  TROPONIN I  TROPONIN I  TROPONIN I   ____________________________________________  EKG  ED ECG REPORT I, Abbegale Stehle J, the attending physician, personally viewed and interpreted this ECG.   Date: 11/23/2018  EKG Time: 0333  Rate: 64  Rhythm: normal EKG, normal sinus rhythm  Axis: Normal  Intervals:none  ST&T Change: Nonspecific  ____________________________________________  RADIOLOGY  ED MD interpretation: Interstitial edema, possible infiltrates  Official radiology report(s): Dg Chest 1 View  Result Date: 11/23/2018 CLINICAL DATA:  Initial evaluation status post intubation. EXAM: CHEST  1 VIEW COMPARISON:  Prior radiograph from 11/18/2018 FINDINGS: Examination technically limited by patient positioning, as the patient is markedly rotated to the left. Endotracheal tube in place with tip positioned 5.5 cm above the carina. Enteric tube courses into the abdomen, side hole beyond the GE junction. Stable cardiomegaly. Mediastinal silhouette grossly within normal limits allowing for technique and patient positioning. Low lung volumes. Veiling opacities overlying the bilateral lungs most consistent with effusions, left greater than right. Superimposed degree of pulmonary interstitial edema. More confluent bibasilar opacities could reflect atelectasis, consolidation, or edema, left greater than right with dense opacity within the retrocardiac left lower lobe. No pneumothorax. No acute osseous finding. IMPRESSION: 1. Tip of endotracheal tube position 5.5 cm above the carina. 2. Enteric tube in place overlying the left upper quadrant, side hole beyond the GE junction. 3. Pulmonary interstitial edema with moderate layering bilateral pleural effusions, left greater than right. Superimposed bibasilar  opacities could reflect atelectasis or infiltrates, also worse on the left. Electronically Signed   By: Rise Mu M.D.   On: 11/23/2018 04:40    ____________________________________________   PROCEDURES  Procedure(s) performed (including Critical Care):  Procedure Name: Intubation Date/Time: 11/23/2018 4:00 AM Performed by: Irean Hong, MD Pre-anesthesia Checklist: Patient identified, Patient being monitored, Emergency Drugs available, Timeout performed and Suction available Oxygen Delivery Method: Non-rebreather mask Preoxygenation: Pre-oxygenation with 100% oxygen Induction Type: Rapid sequence Ventilation: Mask ventilation without difficulty Laryngoscope Size: Glidescope and 3 Grade View: Grade I Tube size: 7.5 mm Number of attempts: 1 Airway Equipment and Method: Rigid stylet Placement Confirmation: ETT inserted through vocal cords under direct vision and CO2 detector Dental Injury: Teeth and Oropharynx as per pre-operative assessment        CRITICAL CARE Performed by: Irean Hong   Total critical care time: 60 minutes  Critical care time was exclusive of separately billable procedures and treating other patients.  Critical care was necessary to treat or prevent imminent or life-threatening deterioration.  Critical care was time spent personally by me on the following activities: development of treatment plan with patient and/or surrogate as well as nursing, discussions with consultants, evaluation of patient's response to treatment, examination of patient, obtaining history from patient or surrogate, ordering and performing treatments and interventions, ordering and review of laboratory studies, ordering and review of radiographic studies, pulse oximetry and re-evaluation of patient's  condition.   ____________________________________________   INITIAL IMPRESSION / ASSESSMENT AND PLAN / ED COURSE  As part of my medical decision making, I reviewed the  following data within the electronic MEDICAL RECORD NUMBER History obtained from family, Nursing notes reviewed and incorporated, Labs reviewed, EKG interpreted, Old EKG reviewed, Old chart reviewed, Radiograph reviewed, Discussed with admitting physician and Notes from prior ED visits        83 year old male with COPD, CHF who was recently hospitalized for CHF exacerbation arrives to the ED unresponsive with reported fever and respiratory failure. Differential includes, but is not limited to, viral syndrome, bronchitis including COPD exacerbation, pneumonia, reactive airway disease including asthma, CHF including exacerbation with or without pulmonary/interstitial edema, pneumothorax, ACS, thoracic trauma, and pulmonary embolism.  In the setting of the current coronavirus pandemic, patient was roomed to a negative pressure room.  After donning PPE, patient was expediently intubated.   Clinical Course as of Nov 23 639  Mon Nov 23, 2018  0359 Spoke with hospitalist Dr. Sheryle Haildiamond who will evaluate patient in emergency department for admission.  Levophed initiated for patient hypotensive with blood pressure 78/46.  Family in the lobby updated.   [JS]  [phone] Updated patient's son who tells me patient started spiking a fever to [address] F yesterday associated with nonproductive cough.  They spoke with his PCP who told him to take 1 of his old antibiotics in order to avoid the emergency department in light of the current pandemic.  Son tells me patient has not traveled outside of the [place] within the last 30 days.  They have not been in contact with persons positive for coronavirus.   [JS]  0446 Systolic BP 110s on Levophed.  Elevated troponin noted.   [JS]    Clinical Course User Index [JS] Irean HongSung, Kavari Parrillo J, MD     ____________________________________________   FINAL CLINICAL IMPRESSION(S) / ED DIAGNOSES  Final diagnoses:  Acute respiratory failure with hypoxia (HCC)  Hypoxia  Elevated troponin   Suspected Covid-19 Virus Infection     ED Discharge Orders    None       Note:  This document was prepared using Dragon voice recognition software and may include unintentional dictation errors.   Irean HongSung, Audri Kozub J, MD 11/23/18 [phone]

## 2018-11-23 NOTE — Progress Notes (Addendum)
Initial Nutrition Assessment  DOCUMENTATION CODES:   Obesity unspecified  INTERVENTION:   If tube feeds initiated, recommend:  Vital HP @ goal rate of 47ml/hr + Prostat 39ml TID  Propofol: 22.5 ml/hr- provides 594kcal/day   Free water flushes 75ml q4 hours to maintain tube patency   Regimen provides 1914kcal/day, 153g/day protein, 777ml/day free water   Liquid MVI daily via tube   Recommend vitamin C 250mg  po BID via tube   NUTRITION DIAGNOSIS:   Inadequate oral intake related to inability to eat(pt sedated and ventilated ) as evidenced by NPO status.  GOAL:   Provide needs based on ASPEN/SCCM guidelines  MONITOR:   Vent status, Labs, Weight trends, Skin, I & O's  REASON FOR ASSESSMENT:   Ventilator    ASSESSMENT:   83 yo male with PMH CAD, CHF and Pneumonia.    Pt sedated and ventilated. Unable to do NFPE at this time as pt is a COVID 19 rule out. Pt does not appear malnourished from distant visualization. OGT in place. Per chart, pt appears weight stable pta. Plan for possible extubation today.   Medications reviewed and include: aspirin, plavix, lovenox, cefepime, levophed, propofol  Labs reviewed: BUN 29(H), creat 1.39(H) Mg 1.8 wnl- 3/19 BNP- 850(H) WBC- 11.5(H), Hgb 9.9(L), Hct 31.3(L)  Patient is currently intubated on ventilator support MV: 7.0 L/min Temp (24hrs), Avg:96 F (35.6 C), Min:94.2 F (34.6 C), Max:97 F (36.1 C)  Propofol: 22.5 ml/hr- provides 594kcal/day   MAP- >59mmHg  UOP-  Unable to complete Nutrition-Focused physical exam at this time.   Diet Order:   Diet Order            Diet NPO time specified  Diet effective now             EDUCATION NEEDS:   No education needs have been identified at this time  Skin:  Skin Assessment: Reviewed RN Assessment(ecchymosis, sacral pressure injury )  Last BM:  pta  Height:   Ht Readings from Last 1 Encounters:  11/13/18 6' (1.829 m)    Weight:   Wt Readings from  Last 1 Encounters:  11/23/18 125 kg    Ideal Body Weight:  80.9 kg  BMI:  Body mass index is 37.37 kg/m.  Estimated Nutritional Needs:   Kcal:  1375-1750kcal/day   Protein:  >162g/day   Fluid:  2L/day   Betsey Holiday MS, RD, LDN Pager #- [phone] Office#- [phone] After Hours Pager: [phone]

## 2018-11-23 NOTE — Care Management (Signed)
Wife has reached out to this RNCM expressing concern for husband/patient. She shared that she is not currently allowed to visit with patient which has caused more concern.  She did find comfort in speaking with [facility] by phone for update. RNCM told Mrs. Olstad that she could reach out to me at any time she needs to talk.

## 2018-11-24 LAB — GLUCOSE, CAPILLARY
Glucose-Capillary: 116 mg/dL — ABNORMAL HIGH (ref 70–99)
Glucose-Capillary: 119 mg/dL — ABNORMAL HIGH (ref 70–99)
Glucose-Capillary: 155 mg/dL — ABNORMAL HIGH (ref 70–99)

## 2018-11-24 LAB — BASIC METABOLIC PANEL
Anion gap: 8 (ref 5–15)
BUN: 32 mg/dL — ABNORMAL HIGH (ref 8–23)
CO2: 28 mmol/L (ref 22–32)
Calcium: 7.9 mg/dL — ABNORMAL LOW (ref 8.9–10.3)
Chloride: 102 mmol/L (ref 98–111)
Creatinine, Ser: 1.57 mg/dL — ABNORMAL HIGH (ref 0.61–1.24)
GFR calc Af Amer: 47 mL/min — ABNORMAL LOW (ref >60)
GFR calc non Af Amer: 41 mL/min — ABNORMAL LOW (ref >60)
Glucose, Bld: 119 mg/dL — ABNORMAL HIGH (ref 70–99)
Potassium: 3.9 mmol/L (ref 3.5–5.1)
Sodium: 138 mmol/L (ref 135–145)

## 2018-11-24 LAB — CBC
HCT: 37.1 % — ABNORMAL LOW (ref 39.0–52.0)
Hemoglobin: 11.4 g/dL — ABNORMAL LOW (ref 13.0–17.0)
MCH: 30.8 pg (ref 26.0–34.0)
MCHC: 30.7 g/dL (ref 30.0–36.0)
MCV: 100.3 fL — ABNORMAL HIGH (ref 80.0–100.0)
Platelets: 198 10*3/uL (ref 150–400)
RBC: 3.7 MIL/uL — ABNORMAL LOW (ref 4.22–5.81)
RDW: 17.8 % — ABNORMAL HIGH (ref 11.5–15.5)
WBC: 10.1 10*3/uL (ref 4.0–10.5)
nRBC: 0 % (ref 0.0–0.2)

## 2018-11-24 LAB — URINE CULTURE: Culture: NO GROWTH

## 2018-11-24 LAB — MAGNESIUM: Magnesium: 2.2 mg/dL (ref 1.7–2.4)

## 2018-11-24 MED ORDER — IPRATROPIUM-ALBUTEROL 20-100 MCG/ACT IN AERS
1.0000 | INHALATION_SPRAY | Freq: Two times a day (BID) | RESPIRATORY_TRACT | Status: DC
  Filled 2018-11-24: qty 4

## 2018-11-24 MED ORDER — SENNOSIDES-DOCUSATE SODIUM 8.6-50 MG PO TABS
2.0000 | ORAL_TABLET | Freq: Two times a day (BID) | ORAL | Status: DC
  Administered 2018-11-24 – 2018-11-25 (×2): 2
  Filled 2018-11-24 (×2): qty 2

## 2018-11-24 MED ORDER — PANTOPRAZOLE SODIUM 40 MG PO PACK
40.0000 mg | PACK | ORAL | Status: DC
  Administered 2018-11-24: 40 mg
  Filled 2018-11-24: qty 20

## 2018-11-24 MED ORDER — VITAMIN C 500 MG PO TABS
500.0000 mg | ORAL_TABLET | ORAL | Status: DC
  Administered 2018-11-24: 500 mg
  Filled 2018-11-24 (×2): qty 1

## 2018-11-24 MED ORDER — LORAZEPAM 1 MG PO TABS
1.0000 mg | ORAL_TABLET | Freq: Once | ORAL | Status: AC
  Administered 2018-11-24: 1 mg via ORAL
  Filled 2018-11-24: qty 1

## 2018-11-24 MED ORDER — LORAZEPAM 1 MG PO TABS
1.0000 mg | ORAL_TABLET | Freq: Two times a day (BID) | ORAL | Status: DC
  Administered 2018-11-24 – 2018-11-29 (×8): 1 mg via ORAL
  Filled 2018-11-24 (×8): qty 1

## 2018-11-24 MED ORDER — FENTANYL CITRATE (PF) 100 MCG/2ML IJ SOLN: 50.0000 ug | Freq: Once | INTRAMUSCULAR | Status: DC

## 2018-11-24 MED ORDER — VITAL HIGH PROTEIN PO LIQD
1000.0000 mL | ORAL | Status: DC
  Administered 2018-11-24: 1000 mL

## 2018-11-24 MED ORDER — IPRATROPIUM-ALBUTEROL 20-100 MCG/ACT IN AERS
1.0000 | INHALATION_SPRAY | Freq: Four times a day (QID) | RESPIRATORY_TRACT | Status: DC | PRN
  Filled 2018-11-24: qty 4

## 2018-11-24 MED ORDER — ATORVASTATIN CALCIUM 20 MG PO TABS
80.0000 mg | ORAL_TABLET | ORAL | Status: DC
  Administered 2018-11-24 – 2018-11-27 (×3): 80 mg via ORAL
  Filled 2018-11-24 (×3): qty 4

## 2018-11-24 MED ORDER — FENTANYL BOLUS VIA INFUSION
25.0000 ug | INTRAVENOUS | Status: DC | PRN
  Administered 2018-11-25: 25 ug via INTRAVENOUS
  Filled 2018-11-24: qty 25

## 2018-11-24 MED ORDER — SENNOSIDES-DOCUSATE SODIUM 8.6-50 MG PO TABS: 2.0000 | ORAL_TABLET | Freq: Two times a day (BID) | ORAL | Status: DC

## 2018-11-24 MED ORDER — PRO-STAT SUGAR FREE PO LIQD
60.0000 mL | Freq: Three times a day (TID) | ORAL | Status: DC
  Administered 2018-11-24 – 2018-11-25 (×4): 60 mL

## 2018-11-24 MED ORDER — QUETIAPINE FUMARATE 25 MG PO TABS
50.0000 mg | ORAL_TABLET | Freq: Once | ORAL | Status: AC
  Administered 2018-11-24: 50 mg via ORAL
  Filled 2018-11-24: qty 2

## 2018-11-24 MED ORDER — IPRATROPIUM-ALBUTEROL 0.5-2.5 (3) MG/3ML IN SOLN
3.0000 mL | Freq: Four times a day (QID) | RESPIRATORY_TRACT | Status: DC
  Administered 2018-11-24 – 2018-11-25 (×5): 3 mL via RESPIRATORY_TRACT
  Filled 2018-11-24 (×5): qty 3

## 2018-11-24 MED ORDER — QUETIAPINE FUMARATE 25 MG PO TABS
50.0000 mg | ORAL_TABLET | Freq: Every day | ORAL | Status: DC
  Administered 2018-11-24: 50 mg via ORAL
  Filled 2018-11-24: qty 2

## 2018-11-24 MED ORDER — ADULT MULTIVITAMIN LIQUID CH
15.0000 mL | Freq: Every day | ORAL | Status: DC
  Administered 2018-11-24 – 2018-11-25 (×2): 15 mL
  Filled 2018-11-24 (×3): qty 15

## 2018-11-24 MED ORDER — FENTANYL 2500MCG IN NS 250ML (10MCG/ML) PREMIX INFUSION
25.0000 ug/h | INTRAVENOUS | Status: DC
  Administered 2018-11-24: 50 ug/h via INTRAVENOUS
  Filled 2018-11-24 (×2): qty 250

## 2018-11-24 MED ORDER — POLYETHYLENE GLYCOL 3350 17 G PO PACK
17.0000 g | PACK | ORAL | Status: DC
  Administered 2018-11-25: 17 g via ORAL
  Filled 2018-11-24: qty 1

## 2018-11-24 NOTE — Progress Notes (Signed)
[employer] - [place] at [facility]   PATIENT NAME: Dylan [name]    MR#:  [id number]  DATE OF BIRTH:  December 03, 1935  SUBJECTIVE:  CHIEF COMPLAINT:   Chief Complaint  Patient presents with  . Respiratory Distress   The patient is on ventilator and sedation. REVIEW OF SYSTEMS:  Review of Systems  Unable to perform ROS: Intubated    DRUG ALLERGIES:  No Known Allergies VITALS:  Blood pressure (!) 111/47, pulse 70, temperature 99.1 F (37.3 C), resp. rate (!) 23, height 6' (1.829 m), weight 126.1 kg, SpO2 97 %. PHYSICAL EXAMINATION:  Physical Exam HENT:     Head: Normocephalic.  Eyes:     General: No scleral icterus.    Conjunctiva/sclera: Conjunctivae normal.  Neck:     Musculoskeletal: Neck supple.     Vascular: No JVD.     Trachea: No tracheal deviation.  Cardiovascular:     Rate and Rhythm: Normal rate and regular rhythm.     Heart sounds: Normal heart sounds. No murmur. No gallop.   Pulmonary:     Effort: No respiratory distress.     Breath sounds: Rhonchi and rales present. No wheezing.  Abdominal:     General: Bowel sounds are normal. There is no distension.     Palpations: Abdomen is soft.     Tenderness: There is no abdominal tenderness. There is no rebound.  Musculoskeletal:        General: No tenderness.  Skin:    Findings: No erythema or rash.  Neurological:     Comments: Unable to exam.    LABORATORY PANEL:  Male CBC Recent Labs  Lab 11/24/18 0440  WBC 10.1  HGB 11.4*  HCT 37.1*  PLT 198   ------------------------------------------------------------------------------------------------------------------ Chemistries  Recent Labs  Lab 11/23/18 0320 11/24/18 0440  NA 141 138  K 4.4 3.9  CL 100 102  CO2 31 28  GLUCOSE 130* 119*  BUN 29* 32*  CREATININE 1.39* 1.57*  CALCIUM 8.5* 7.9*  MG  --  2.2  AST 63*  --   ALT 50*  --   ALKPHOS 103  --   BILITOT 0.6  --    RADIOLOGY:  No results found. ASSESSMENT AND PLAN:    This is an 83 year old male admitted for acute respiratory failure. 1.  Acute Respiratory failure with hypoxemia.  Secondary to pulmonary edema plus or minus underlying inflammatory process.  Continue mechanical ventilation.Try SAT/SBT per Dr. Belia Heman.    Sepsis with  high risk for novel coronavirus infection.  Continue cefepime and vancomycin is discontinued. The patient had blood cultures obtained on his previous admission that showed strep viridans as well as Rothia mucilaginosa (part of Acinetobacter family).  CTA at that time did not demonstrate pulmonary embolism, and TEE did not show endocarditis.  Blood culture presumed to be a contaminant.  Follow current blood cultures for growth and sensitivities.    Follow-up COVID19 test. 2.  CHF: Acute on chronic; combined systolic and diastolic.  Last EF 30 to 35% with grade 2 diastolic dysfunction. Given dose of Lasix 60 mg IV due to pulmonary edema.  Continue diuretic therapy as needed. 3.  Acute kidney injury: Hold Entresto as well as spironolactone.  Worsening, follow-up BMP. 4.  CAD: Continue aspirin, Plavix and Lipitor. 5.  Elevated troponin: Secondary to demand ischemia.  Likely due to sepsis. Continue aspirin, Plavix and Lipitor. Discussed with Dr. Marden Noble. All the records are reviewed and case discussed with Care Management/Social Worker. Management  plans discussed with the patient, family and they are in agreement.  CODE STATUS: Full Code  TOTAL TIME TAKING CARE OF THIS PATIENT: 32 minutes.   More than 50% of the time was spent in counseling/coordination of care: YES  POSSIBLE D/C IN ? DAYS, DEPENDING ON CLINICAL CONDITION.   Shaune Pollack M.D on 11/24/2018 at 2:20 PM  Between 7am to 6pm - Pager - [phone]  After 6pm go to [email or web address] - Therapist, nutritional Hospitalists

## 2018-11-24 NOTE — TOC Initial Note (Signed)
Transition of Care [facility]) - Initial/Assessment Note    Patient Details  Name: Dylan [name] MRN: [id number] Date of Birth: August 19, 1936  Transition of Care [facility]) CM/SW Contact:    Allayne Butcher, RN Phone Number: 11/24/2018, 12:48 PM  Clinical Narrative:                 Patient admitted with acute respiratory failure requiring intubation.  Patient is currently in the ICU ventilated and sedated.  Airborne precautions to r/o COVID.  Patient is from home with wife, recently discharged with home health services- [facility] with REDS vest.  Patient also was scheduled with [facility].  Mellissa Kohut with [facility] notified of patient admission.     Expected Discharge Plan: Home/Self Care     Patient Goals and CMS Choice        Expected Discharge Plan and Services Expected Discharge Plan: Home/Self Care   Discharge Planning Services: CM Consult   Living arrangements for the past 2 months: Single Family Home                          Prior Living Arrangements/Services Living arrangements for the past 2 months: Single Family Home Lives with:: Spouse Patient language and need for interpreter reviewed:: No        Need for Family Participation in Patient Care: Yes (Comment)(critically ill ) Care giver support system in place?: Yes (comment)(wife) Current home services: DME, Home RN, Home OT, Home PT(Open with [facility])    Activities of Daily Living      Permission Sought/Granted                  Emotional Assessment Appearance:: Appears stated age Attitude/Demeanor/Rapport: Sedated Affect (typically observed): Unable to Assess   Alcohol / Substance Use: Not Applicable Psych Involvement: No (comment)  Admission diagnosis:  Hypoxia [R09.02] Elevated troponin [R79.89] Acute respiratory failure with hypoxia (HCC) [J96.01] Acute respiratory failure with hypoxemia (HCC) [J96.01] Patient Active Problem List   Diagnosis Date Noted  . Acute respiratory failure with hypoxemia  (HCC) 11/23/2018  . Goals of care, counseling/discussion   . Palliative care by specialist   . DNR (do not resuscitate) discussion   . Acute CHF (congestive heart failure) (HCC) 11/14/2018  . Acute on chronic systolic CHF (congestive heart failure) (HCC) 10/27/2018  . HCAP (healthcare-associated pneumonia) 10/27/2018  . Hypokalemia 10/01/2018  . Hypertensive heart and kidney disease with acute on chronic diastolic congestive heart failure and stage 3 chronic kidney disease (HCC) 09/09/2018  . Chronic constipation 09/09/2018  . BPH with obstruction/lower urinary tract symptoms 09/09/2018  . Obstructive sleep apnea of adult 09/09/2018  . Idiopathic peripheral neuropathy 09/09/2018  . Chronic kidney disease, stage 3, mod decreased GFR (HCC) 09/09/2018  . Anemia of chronic renal failure, stage 3 (moderate) (HCC) 09/09/2018  . Periprosthetic fracture around internal prosthetic left knee joint 08/26/2018  . Acute ST elevation myocardial infarction (STEMI) of inferior wall (HCC) 06/21/2018  . S/P total knee arthroplasty 06/01/2018  . Primary osteoarthritis of left knee 02/05/2018  . Sepsis (HCC) 01/10/2018  . Community acquired pneumonia 12/01/2017  . Pneumonia 12/01/2017  . Hyperlipidemia 09/23/2017  . Weakness 08/31/2017  . Benign essential tremor 10/18/2016  . Class 3 severe obesity due to excess calories with serious comorbidity and body mass index (BMI) of 45.0 to 49.9 in adult (HCC) 10/18/2016  . Varicose veins of lower extremities with ulcer (HCC) 06/17/2016  . Chronic venous insufficiency 06/17/2016  . Lymphedema  06/17/2016  . Acute on chronic diastolic heart failure (HCC) 06/10/2016  . COPD, severe (HCC) 05/24/2016  . AV block, Mobitz 1 02/09/2016  . Coronary atherosclerosis of native coronary artery 02/09/2016  . ST elevation MI (STEMI) (HCC) 02/08/2016  . Right foot drop 04/07/2014  . Rotator cuff rupture, complete 04/22/2013  . Neck pain 04/14/2013   PCP:  [name],  MD Pharmacy:   [facility] - [place], [place] - [address] [place] [address] Phone: [phone] Fax: [phone]     Social Determinants of Health (SDOH) Interventions    Readmission Risk Interventions Readmission Risk Prevention Plan 11/17/2018 11/17/2018 11/17/2018  Transportation Screening - - Complete  PCP or Specialist appointment within 3-5 days of discharge Complete Patient refused -  PCP/Specialist Appt Not Complete comments - wife is nurse; Dr. Arlana Pouch agrees to sign orders without seeing patient per Jasmine December at [facility] office; wife agrees to make appt. -  HRI or Home Care Consult - (No Data) Complete  SW Recovery Care/Counseling Consult - - Complete  Palliative Care Screening - - Complete  Skilled Nursing Facility - - Not Complete  SNF Comments - - patient declined  Some recent data might be hidden

## 2018-11-24 NOTE — Progress Notes (Signed)
Nutrition Follow-up  RD working remotely.  DOCUMENTATION CODES:   Obesity unspecified  INTERVENTION:  Initiate Vital High Protein at 40 mL/hr (960 mL goal daily volume) + Pro-Stat 60 mL TID per tube. Provides 1560 kcal, 174 grams of protein, 806 mL H2O daily.  Provide minimum free water flush of 30 mL Q4hrs to maintain tube patency.  Provide liquid MVI daily per tube as goal TF regimen does not meet 100% RDIs for vitamins/minerals.  Continue vitamin C 500 mg daily per tube.  NUTRITION DIAGNOSIS:   Inadequate oral intake related to inability to eat(pt sedated and ventilated ) as evidenced by NPO status.  Ongoing.  GOAL:   Provide needs based on ASPEN/SCCM guidelines  Progressing with initiation of tube feeds.  MONITOR:   Vent status, Labs, Weight trends, Skin, I & O's  REASON FOR ASSESSMENT:   Ventilator, Consult Enteral/tube feeding initiation and management  ASSESSMENT:   83 yo male with PMH CAD, CHF and Pneumonia, ruling out COVID-19.  RD received consult for TF initiation/management. Patient now off propofol gtt so will recalculate tube feed regimen.  Patient remains intubated and sedated. On PRVC mode with FiO2 24% and PEEP 5 cmH2O. Per RN documentation abdomen is soft. Last BM unknown/PTA.  Enteral Access: 18 Fr. OGT placed 3/23; terminates in stomach per chest x-ray 3/23; 70 cm at corner of mouth  MAP: 64-91 mmHg  Patient is currently intubated on ventilator support Ve: 10.3 L/min Temp (24hrs), Avg:98.4 F (36.9 C), Min:95.9 F (35.5 C), Max:99.5 F (37.5 C)  Propofol: N/A  Medications reviewed and include: Ativan, Protonix 40 mg daily per tube, Seroquel QHS, vitamin C 500 mg daily per tube, cefepime, fentanyl gtt, norepinephrine gtt at 5 mcg/min.  Labs reviewed: CBG 102-126 past 24 hrs, BUN 32, Creatinine 1.57.  I/O: 735 mL UOP yesterday (0.2 mL/kg/hr)  Discussed with team over the phone.  Diet Order:   Diet Order            Diet NPO time  specified  Diet effective now             EDUCATION NEEDS:   No education needs have been identified at this time  Skin:  Skin Assessment: Skin Integrity Issues:(ecchymosis, pressure injury to sacrum)  Last BM:  Unknown/PTA  Height:   Ht Readings from Last 1 Encounters:  11/23/18 6' (1.829 m)   Weight:   Wt Readings from Last 1 Encounters:  11/24/18 126.1 kg   Ideal Body Weight:  80.9 kg  BMI:  Body mass index is 37.7 kg/m.  Estimated Nutritional Needs:   Kcal:  1375-1750kcal/day   Protein:  >162g/day   Fluid:  2L/day   Helane Rima, MS, RD, LDN Office: [phone] Pager: [phone] After Hours/Weekend Pager: [phone]

## 2018-11-24 NOTE — Consult Note (Signed)
Name: Dylan [name] MRN: [id number] DOB: [birth year]     CONSULTATION DATE: 11/23/2018  REFERRING MD :  Bridgett Larsson  CC follow up resp failure  HISTORY OF PRESENT ILLNESS:   Critically ill Prognosis is guarded Remains on vent   SIGNIFICANT EVENTS 3.22 admitted for resp failure intubated r/o COVID 3.23 failed SAT/SBT    REVIEW OF SYSTEMS  PATIENT IS UNABLE TO PROVIDE COMPLETE REVIEW OF SYSTEM S DUE TO SEVERE CRITICAL ILLNESS AND ENCEPHALOPATHY    VITAL SIGNS: Temp:  [95.9 F (35.5 C)-99.5 F (37.5 C)] 99 F (37.2 C) (03/24 1000) Pulse Rate:  [48-72] 60 (03/24 1000) Resp:  [16-25] 18 (03/24 1000) BP: (78-140)/(46-73) 123/56 (03/24 1000) SpO2:  [92 %-100 %] 97 % (03/24 1000) FiO2 (%):  [24 %] 24 % (03/24 0829) Weight:  [125.6 kg-126.1 kg] 126.1 kg (03/24 0421)     CULTURE RESULTS   Recent Results (from the past 240 hour(s))  CULTURE, BLOOD (ROUTINE X 2) w Reflex to ID Panel     Status: None   Collection Time: 11/16/18 11:58 AM  Result Value Ref Range Status   Specimen Description BLOOD LEFT ARM  Final   Special Requests   Final    BOTTLES DRAWN AEROBIC AND ANAEROBIC Blood Culture adequate volume   Culture   Final    NO GROWTH 5 DAYS Performed at [facility], [address] [place], [place] [address]    Report Status 11/21/2018 FINAL  Final  CULTURE, BLOOD (ROUTINE X 2) w Reflex to ID Panel     Status: None   Collection Time: 11/16/18 11:58 AM  Result Value Ref Range Status   Specimen Description BLOOD LEFT HAND  Final   Special Requests   Final    BOTTLES DRAWN AEROBIC AND ANAEROBIC Blood Culture adequate volume   Culture   Final    NO GROWTH 5 DAYS Performed at [facility], [address] [place], [place] [address]    Report Status 11/21/2018 FINAL  Final  Blood Culture (routine x 2)     Status: None (Preliminary result)   Collection Time: 11/23/18  3:20 AM  Result Value Ref Range Status   Specimen Description BLOOD RIGHT FOREARM   Final   Special Requests   Final    BOTTLES DRAWN AEROBIC AND ANAEROBIC Blood Culture adequate volume   Culture   Final    NO GROWTH 1 DAY Performed at [facility], [address] [place], [place] [address]    Report Status PENDING  Incomplete  Blood Culture (routine x 2)     Status: None (Preliminary result)   Collection Time: 11/23/18  3:20 AM  Result Value Ref Range Status   Specimen Description BLOOD LEFT ARM  Final   Special Requests   Final    BOTTLES DRAWN AEROBIC AND ANAEROBIC Blood Culture adequate volume   Culture   Final    NO GROWTH 1 DAY Performed at [facility], [address] [place], [place] [address]    Report Status PENDING  Incomplete  Urine culture     Status: None   Collection Time: 11/23/18  3:39 AM  Result Value Ref Range Status   Specimen Description   Final    URINE, RANDOM Performed at [facility], [address] [place], [place] [address]    Special Requests   Final    NONE Performed at [facility], [address] [place],  [address]    Culture  Final    NO GROWTH Performed at [facility], [place] [address] [place], [place] [address]    Report Status 11/24/2018 FINAL  Final  MRSA PCR Screening     Status: None   Collection Time: 11/23/18  6:26 AM  Result Value Ref Range Status   MRSA by PCR NEGATIVE NEGATIVE Final    Comment:        The GeneXpert MRSA Assay (FDA approved for NASAL specimens only), is one component of a comprehensive MRSA colonization surveillance program. It is not intended to diagnose MRSA infection nor to guide or monitor treatment for MRSA infections. Performed at [facility], [address] [place], [place] [address]   Respiratory Panel by PCR     Status: None   Collection Time: 11/23/18 11:21 AM  Result Value Ref Range Status   Adenovirus NOT DETECTED NOT DETECTED Final   Coronavirus 229E NOT DETECTED NOT DETECTED Final    Comment: (NOTE)  The Coronavirus on the Respiratory Panel, DOES NOT test for the novel  Coronavirus (2019 nCoV)    Coronavirus HKU1 NOT DETECTED NOT DETECTED Final   Coronavirus NL63 NOT DETECTED NOT DETECTED Final   Coronavirus OC43 NOT DETECTED NOT DETECTED Final   Metapneumovirus NOT DETECTED NOT DETECTED Final   Rhinovirus / Enterovirus NOT DETECTED NOT DETECTED Final   Influenza A NOT DETECTED NOT DETECTED Final   Influenza B NOT DETECTED NOT DETECTED Final   Parainfluenza Virus 1 NOT DETECTED NOT DETECTED Final   Parainfluenza Virus 2 NOT DETECTED NOT DETECTED Final   Parainfluenza Virus 3 NOT DETECTED NOT DETECTED Final   Parainfluenza Virus 4 NOT DETECTED NOT DETECTED Final   Respiratory Syncytial Virus NOT DETECTED NOT DETECTED Final   Bordetella pertussis NOT DETECTED NOT DETECTED Final   Chlamydophila pneumoniae NOT DETECTED NOT DETECTED Final   Mycoplasma pneumoniae NOT DETECTED NOT DETECTED Final    Comment: Performed at [facility], [place]. [address] [place], [place] [address]          IMAGING    No results found. CBC    Component Value Date/Time   WBC 10.1 11/24/2018 0440   RBC 3.70 (L) 11/24/2018 0440   HGB 11.4 (L) 11/24/2018 0440   HGB 13.0 11/30/2012 1708   HCT 37.1 (L) 11/24/2018 0440   HCT 38.0 (L) 11/30/2012 1708   PLT 198 11/24/2018 0440   PLT 136 (L) 11/30/2012 1708   MCV 100.3 (H) 11/24/2018 0440   MCV 92 11/30/2012 1708   MCH 30.8 11/24/2018 0440   MCHC 30.7 11/24/2018 0440   RDW 17.8 (H) 11/24/2018 0440   RDW 14.6 (H) 11/30/2012 1708   LYMPHSABS 0.5 (L) 11/23/2018 0320   LYMPHSABS 2.4 11/30/2012 1708   MONOABS 0.2 11/23/2018 0320   MONOABS 0.8 11/30/2012 1708   EOSABS 0.1 11/23/2018 0320   EOSABS 0.2 11/30/2012 1708   BASOSABS 0.0 11/23/2018 0320   BASOSABS 0.1 11/30/2012 1708   BMP Latest Ref Rng & Units 11/24/2018 11/23/2018 11/19/2018  Glucose 70 - 99 mg/dL 119(H) 130(H) 90  BUN 8 - 23 mg/dL 32(H) 29(H) 25(H)  Creatinine 0.61 - 1.24 mg/dL  1.57(H) 1.39(H) 0.86  Sodium 135 - 145 mmol/L 138 141 140  Potassium 3.5 - 5.1 mmol/L 3.9 4.4 3.8  Chloride 98 - 111 mmol/L 102 100 101  CO2 22 - 32 mmol/L _0 Calcium 8.9 - 10.3 mg/dL 7.9(L) 8.5(L) 8.5(L)          Indwelling  Urinary Catheter continued, requirement due to   Reason to continue Indwelling Urinary Catheter for strict Intake/Output monitoring for hemodynamic instability         Ventilator continued, requirement due to, resp failure    Ventilator Sedation RASS 0 to -2       ASSESSMENT AND PLAN SYNOPSIS  Severe Hypoxic and Hypercapnic Respiratory Failure Pneumonia assess for COVID -continue Mechanical Ventilator support -continue Bronchodilator Therapy -Wean Fio2 and PEEP as tolerated -VAP/VENT bundle implementation -will perform SAT/SBT when respiratory parameters are met Patient failed SAT/SBT yesterday Plan again today   CARDIAC FAILURE-systolic CHF EF 09% -oxygen as needed -Lasix as tolerated -follow up cardiac enzymes as indicated   ACUTE KIDNEY INJURY/Renal Failure -follow chem 7 -follow UO -continue Foley Catheter-assess need   NEUROLOGY - intubated and sedated - minimal sedation to achieve a RASS goal: -1 Started on Seroquel for delerium    CARDIAC ICU monitoring  ID -continue IV abx as prescibed -follow up cultures R/o COVID-test pending   GI/Nutrition GI PROPHYLAXIS as indicated DIET-->TF's as tolerated Constipation protocol as indicated  ENDO - ICU hypoglycemic\Hyperglycemia protocol -check FSBS per protocol  ELECTROLYTES -follow labs as needed -replace as needed -pharmacy consultation and following    DVT/GI PRX ordered TRANSFUSIONS AS NEEDED MONITOR FSBS ASSESS the need for LABS as needed    Critical Care Time devoted to patient care services described in this note is 32 minutes.   Overall, patient is critically ill, prognosis is guarded.  Patient with Multiorgan failure and at high risk for  cardiac arrest and death.    Corrin Parker, M.D.  [facility]  Medical Director [place] Director [facility] Cardio-Pulmonary Department

## 2018-11-25 LAB — GLUCOSE, CAPILLARY
Glucose-Capillary: 103 mg/dL — ABNORMAL HIGH (ref 70–99)
Glucose-Capillary: 134 mg/dL — ABNORMAL HIGH (ref 70–99)
Glucose-Capillary: 97 mg/dL (ref 70–99)

## 2018-11-25 LAB — PHOSPHORUS: Phosphorus: 4.1 mg/dL (ref 2.5–4.6)

## 2018-11-25 LAB — MAGNESIUM: Magnesium: 2.2 mg/dL (ref 1.7–2.4)

## 2018-11-25 MED ORDER — ASPIRIN 81 MG PO CHEW: 81.0000 mg | CHEWABLE_TABLET | Freq: Every day | ORAL | Status: DC

## 2018-11-25 MED ORDER — PANTOPRAZOLE SODIUM 40 MG IV SOLR
40.0000 mg | INTRAVENOUS | Status: DC
  Administered 2018-11-25: 40 mg via INTRAVENOUS
  Filled 2018-11-25: qty 40

## 2018-11-25 MED ORDER — ORAL CARE MOUTH RINSE
15.0000 mL | OROMUCOSAL | Status: DC
  Administered 2018-11-26 – 2018-11-30 (×13): 15 mL via OROMUCOSAL

## 2018-11-25 MED ORDER — SENNOSIDES-DOCUSATE SODIUM 8.6-50 MG PO TABS
2.0000 | ORAL_TABLET | Freq: Two times a day (BID) | ORAL | Status: DC
  Administered 2018-11-26 – 2018-11-27 (×3): 2 via ORAL
  Filled 2018-11-25 (×3): qty 2

## 2018-11-25 MED ORDER — ADULT MULTIVITAMIN LIQUID CH
15.0000 mL | Freq: Every day | ORAL | Status: DC
  Filled 2018-11-25: qty 15

## 2018-11-25 MED ORDER — VITAMIN C 500 MG PO TABS
500.0000 mg | ORAL_TABLET | ORAL | Status: DC
  Administered 2018-11-26 – 2018-11-27 (×2): 500 mg via ORAL
  Filled 2018-11-25 (×2): qty 1

## 2018-11-25 MED ORDER — CLOPIDOGREL BISULFATE 75 MG PO TABS
75.0000 mg | ORAL_TABLET | ORAL | Status: DC
  Administered 2018-11-26 – 2018-11-28 (×3): 75 mg via ORAL
  Filled 2018-11-25 (×3): qty 1

## 2018-11-25 NOTE — Progress Notes (Signed)
[employer] - [place] at [facility] [facility]   PATIENT NAME: Dylan [name]    MR#:  [id number]  DATE OF BIRTH:  [birth year]  SUBJECTIVE:  CHIEF COMPLAINT:   Chief Complaint  Patient presents with  . Respiratory Distress   The patient is on ventilator and sedation. REVIEW OF SYSTEMS:  Review of Systems  Unable to perform ROS: Intubated    DRUG ALLERGIES:  No Known Allergies VITALS:  Blood pressure (!) 159/89, pulse 72, temperature 99.3 F (37.4 C), temperature source Oral, resp. rate 19, height 6' (1.829 m), weight 127.4 kg, SpO2 100 %. PHYSICAL EXAMINATION:  Physical Exam HENT:     Head: Normocephalic.  Eyes:     General: No scleral icterus.    Conjunctiva/sclera: Conjunctivae normal.  Neck:     Musculoskeletal: Neck supple.     Vascular: No JVD.     Trachea: No tracheal deviation.  Cardiovascular:     Rate and Rhythm: Normal rate and regular rhythm.     Heart sounds: Normal heart sounds. No murmur. No gallop.   Pulmonary:     Effort: No respiratory distress.     Breath sounds: Rhonchi and rales present. No wheezing.  Abdominal:     General: Bowel sounds are normal. There is no distension.     Palpations: Abdomen is soft.     Tenderness: There is no abdominal tenderness. There is no rebound.  Musculoskeletal:        General: No tenderness.  Skin:    Findings: No erythema or rash.  Neurological:     Comments: Unable to exam.    LABORATORY PANEL:  Male CBC Recent Labs  Lab 11/24/18 0440  WBC 10.1  HGB 11.4*  HCT 37.1*  PLT 198   ------------------------------------------------------------------------------------------------------------------ Chemistries  Recent Labs  Lab 11/23/18 0320 11/24/18 0440 11/25/18 0456  NA 141 138  --   K 4.4 3.9  --   CL 100 102  --   CO2 31 28  --   GLUCOSE 130* 119*  --   BUN 29* 32*  --   CREATININE 1.39* 1.57*  --   CALCIUM 8.5* 7.9*  --   MG  --  2.2 2.2  AST 63*  --   --   ALT 50*  --   --    ALKPHOS 103  --   --   BILITOT 0.6  --   --    RADIOLOGY:  No results found. ASSESSMENT AND PLAN:   This is an 83 year old male admitted for acute respiratory failure. 1.  Acute Respiratory failure with hypoxemia.  Secondary to pulmonary edema plus or minus underlying inflammatory process.  The patient was extubated this morning, continue oxygen by nasal cannula.  NEB.    Sepsis with  high risk for novel coronavirus infection.  Continue cefepime and vancomycin is discontinued. The patient had blood cultures obtained on his previous admission that showed strep viridans as well as Rothia mucilaginosa (part of Acinetobacter family).  CTA at that time did not demonstrate pulmonary embolism, and TEE did not show endocarditis.  Blood culture presumed to be a contaminant.  Current blood cultures are negative so far.   Follow-up COVID19 test. 2.  CHF: Acute on chronic; combined systolic and diastolic.  Last EF 30 to 35% with grade 2 diastolic dysfunction. Given dose of Lasix 60 mg IV due to pulmonary edema.  Continue diuretic therapy as needed. 3.  Acute kidney injury: Hold Entresto as well as spironolactone, follow-up BMP.  4.  CAD: Continue aspirin, Plavix and Lipitor. 5.  Elevated troponin: Secondary to demand ischemia.  Likely due to sepsis. Continue aspirin, Plavix and Lipitor. Discussed with Dr. Belia Heman. All the records are reviewed and case discussed with Care Management/Social Worker. Management plans discussed with the patient, family and they are in agreement.  CODE STATUS: Full Code  TOTAL TIME TAKING CARE OF THIS PATIENT: 32 minutes.   More than 50% of the time was spent in counseling/coordination of care: YES  POSSIBLE D/C IN ? DAYS, DEPENDING ON CLINICAL CONDITION.   Shaune Pollack M.D on 11/25/2018 at 1:07 PM  Between 7am to 6pm - Pager - [phone]  After 6pm go to [email or web address] - Therapist, nutritional Hospitalists

## 2018-11-25 NOTE — Care Management (Signed)
Patient's wife called this RNCM stating that her work partners are now concerned about distancing her related to patient being tested for Covid 19.  She needs note saying that she "is not contagious" and does not want to bring patient home until his results are negative.  RNCM advised wife to call unit so that RN can discuss work note with provider.  Unit RNCM updated.

## 2018-11-25 NOTE — Consult Note (Signed)
Pharmacy Antibiotic Note  Dylan [name] is a 83 y.o. male admitted on 11/23/2018 with sepsis.  Pharmacy has been consulted for Cefepime and vancomycin dosing. Patient was recently admitted (Discharged 3/19) with acute CHF and lower respiratory infection. Patient extubated 3/25   Plan: Continue Cefepime 2g IV every 12 hours.    Height: 6' (182.9 cm) Weight: 280 lb 13.9 oz (127.4 kg) IBW/kg (Calculated) : 77.6  Temp (24hrs), Avg:98.6 F (37 C), Min:97.2 F (36.2 C), Max:99.3 F (37.4 C)  Recent Labs  Lab 11/19/18 0428 11/23/18 0320 11/24/18 0440  WBC 5.3 11.5* 10.1  CREATININE 0.86 1.39* 1.57*  LATICACIDVEN  --  1.5  --     Estimated Creatinine Clearance: 50.9 mL/min (A) (by C-G formula based on SCr of 1.57 mg/dL (H)).    No Known Allergies  Antimicrobials this admission: Vancomycin 3/23 x 1 Zosyn 3/23 x 1 Cefepime 3/23 >>   Microbiology results: 3/23 BCx: no growth x 2 days  3/23 UCx: no growth  3/23 MRSA PCR: negative  3/23 Respiratory Panel: negative  3/23 Coronovirus: pending    Thank you for allowing pharmacy to be a part of this patient's care.  Barret Esquivel L, 11/25/2018 4:16 PM

## 2018-11-25 NOTE — Progress Notes (Signed)
Name: Dylan [name] MRN: [id number] DOB: 1936-04-25     CONSULTATION DATE: 11/23/2018  REFERRING MD :  Bridgett Larsson  CC Follow up Resp failure  HISTORY OF PRESENT ILLNESS:   Remains on vent Severe resp failure COVID test pending Failed SAT/SBT last 2 days  SIGNIFICANT EVENTS 3.22 admitted for resp failure intubated r/o COVID Dx pneumonia  3.23 failed SAT/SBT 3.24 failed SAT/SBT     REVIEW OF SYSTEMS  PATIENT IS UNABLE TO PROVIDE COMPLETE REVIEW OF SYSTEM S DUE TO SEVERE CRITICAL ILLNESS AND ENCEPHALOPATHY    VITAL SIGNS: Temp:  [98.2 F (36.8 C)-99.5 F (37.5 C)] 99.1 F (37.3 C) (03/25 0200) Pulse Rate:  [56-72] 61 (03/25 0200) Resp:  [16-24] 21 (03/25 0200) BP: (87-174)/(45-84) 129/59 (03/25 0200) SpO2:  [96 %-100 %] 99 % (03/25 0200) FiO2 (%):  [24 %] 24 % (03/25 0233) Weight:  [126.1 kg] 126.1 kg (03/24 0421)  Vent Mode: PRVC FiO2 (%):  [24 %] 24 % Set Rate:  [16 bmp] 16 bmp Vt Set:  [450 mL] 450 mL PEEP:  [5 cmH20] 5 cmH20 Plateau Pressure:  [13 cmH20] 13 cmH20    CULTURE RESULTS   Recent Results (from the past 240 hour(s))  CULTURE, BLOOD (ROUTINE X 2) w Reflex to ID Panel     Status: None   Collection Time: 11/16/18 11:58 AM  Result Value Ref Range Status   Specimen Description BLOOD LEFT ARM  Final   Special Requests   Final    BOTTLES DRAWN AEROBIC AND ANAEROBIC Blood Culture adequate volume   Culture   Final    NO GROWTH 5 DAYS Performed at [facility], [address] [place], [place] [address]    Report Status 11/21/2018 FINAL  Final  CULTURE, BLOOD (ROUTINE X 2) w Reflex to ID Panel     Status: None   Collection Time: 11/16/18 11:58 AM  Result Value Ref Range Status   Specimen Description BLOOD LEFT HAND  Final   Special Requests   Final    BOTTLES DRAWN AEROBIC AND ANAEROBIC Blood Culture adequate volume   Culture   Final    NO GROWTH 5 DAYS Performed at [facility], [address] [place], [place] [address]     Report Status 11/21/2018 FINAL  Final  Blood Culture (routine x 2)     Status: None (Preliminary result)   Collection Time: 11/23/18  3:20 AM  Result Value Ref Range Status   Specimen Description BLOOD RIGHT FOREARM  Final   Special Requests   Final    BOTTLES DRAWN AEROBIC AND ANAEROBIC Blood Culture adequate volume   Culture   Final    NO GROWTH 1 DAY Performed at [facility], [address] [place], [place] [address]    Report Status PENDING  Incomplete  Blood Culture (routine x 2)     Status: None (Preliminary result)   Collection Time: 11/23/18  3:20 AM  Result Value Ref Range Status   Specimen Description BLOOD LEFT ARM  Final   Special Requests   Final    BOTTLES DRAWN AEROBIC AND ANAEROBIC Blood Culture adequate volume   Culture   Final    NO GROWTH 1 DAY Performed at [facility], [address], [place], [place] [address]    Report Status PENDING  Incomplete  Urine culture     Status: None   Collection Time: 11/23/18  3:39 AM  Result Value Ref Range Status   Specimen Description  Final    URINE, RANDOM Performed at [facility], [address], [place], [place] [address]    Special Requests   Final    NONE Performed at [facility], [address], [place], [place] [address]    Culture   Final    NO GROWTH Performed at [facility], [place] [address], [place], [place] [address]    Report Status 11/24/2018 FINAL  Final  MRSA PCR Screening     Status: None   Collection Time: 11/23/18  6:26 AM  Result Value Ref Range Status   MRSA by PCR NEGATIVE NEGATIVE Final    Comment:        The GeneXpert MRSA Assay (FDA approved for NASAL specimens only), is one component of a comprehensive MRSA colonization surveillance program. It is not intended to diagnose MRSA infection nor to guide or monitor treatment for MRSA infections. Performed at [facility], [address] [place], [place] [address]    Respiratory Panel by PCR     Status: None   Collection Time: 11/23/18 11:21 AM  Result Value Ref Range Status   Adenovirus NOT DETECTED NOT DETECTED Final   Coronavirus 229E NOT DETECTED NOT DETECTED Final    Comment: (NOTE) The Coronavirus on the Respiratory Panel, DOES NOT test for the novel  Coronavirus (2019 nCoV)    Coronavirus HKU1 NOT DETECTED NOT DETECTED Final   Coronavirus NL63 NOT DETECTED NOT DETECTED Final   Coronavirus OC43 NOT DETECTED NOT DETECTED Final   Metapneumovirus NOT DETECTED NOT DETECTED Final   Rhinovirus / Enterovirus NOT DETECTED NOT DETECTED Final   Influenza A NOT DETECTED NOT DETECTED Final   Influenza B NOT DETECTED NOT DETECTED Final   Parainfluenza Virus 1 NOT DETECTED NOT DETECTED Final   Parainfluenza Virus 2 NOT DETECTED NOT DETECTED Final   Parainfluenza Virus 3 NOT DETECTED NOT DETECTED Final   Parainfluenza Virus 4 NOT DETECTED NOT DETECTED Final   Respiratory Syncytial Virus NOT DETECTED NOT DETECTED Final   Bordetella pertussis NOT DETECTED NOT DETECTED Final   Chlamydophila pneumoniae NOT DETECTED NOT DETECTED Final   Mycoplasma pneumoniae NOT DETECTED NOT DETECTED Final    Comment: Performed at [facility], [place]. [address] [place],  [address]          IMAGING    No results found. CBC    Component Value Date/Time   WBC 10.1 11/24/2018 0440   RBC 3.70 (L) 11/24/2018 0440   HGB 11.4 (L) 11/24/2018 0440   HGB 13.0 11/30/2012 1708   HCT 37.1 (L) 11/24/2018 0440   HCT 38.0 (L) 11/30/2012 1708   PLT 198 11/24/2018 0440   PLT 136 (L) 11/30/2012 1708   MCV 100.3 (H) 11/24/2018 0440   MCV 92 11/30/2012 1708   MCH 30.8 11/24/2018 0440   MCHC 30.7 11/24/2018 0440   RDW 17.8 (H) 11/24/2018 0440   RDW 14.6 (H) 11/30/2012 1708   LYMPHSABS 0.5 (L) 11/23/2018 0320   LYMPHSABS 2.4 11/30/2012 1708   MONOABS 0.2 11/23/2018 0320   MONOABS 0.8 11/30/2012 1708   EOSABS 0.1 11/23/2018 0320   EOSABS 0.2 11/30/2012 1708    BASOSABS 0.0 11/23/2018 0320   BASOSABS 0.1 11/30/2012 1708   BMP Latest Ref Rng & Units 11/24/2018 11/23/2018 11/19/2018  Glucose 70 - 99 mg/dL 119(H) 130(H) 90  BUN 8 - 23 mg/dL 32(H) 29(H) 25(H)  Creatinine 0.61 - 1.24 mg/dL 1.57(H) 1.39(H) 0.86  Sodium 135 - 145 mmol/L 138  141 140  Potassium 3.5 - 5.1 mmol/L 3.9 4.4 3.8  Chloride 98 - 111 mmol/L 102 100 101  CO2 22 - 32 mmol/L '28 31 31  ' Calcium 8.9 - 10.3 mg/dL 7.9(L) 8.5(L) 8.5(L)           Indwelling Urinary Catheter continued, requirement due to   Reason to continue Indwelling Urinary Catheter for strict Intake/Output monitoring for hemodynamic instability         Ventilator continued, requirement due to, resp failure    Ventilator Sedation RASS 0 to -2       ASSESSMENT AND PLAN SYNOPSIS   Severe Hypoxic and Hypercapnic Respiratory Failure -continue Mechanical Ventilator support -continue Bronchodilator Therapy -Wean Fio2 and PEEP as tolerated -VAP/VENT bundle implementation -will perform SAT/SBT when respiratory parameters are met Failed SAT/SBT due to resp muscle fatigue and delerium  And CHF   CARDIAC FAILURE-EF 35% Lasix as tolerated  ACUTE KIDNEY INJURY/Renal Failure -follow chem 7 -follow UO -continue Foley Catheter-assess need   NEUROLOGY - intubated and sedated - minimal sedation to achieve a RASS goal: -1 Assess dose of Seroquel started for delerium    CARDIAC ICU monitoring  INFECTIOUS DISEASE -continue antibiotics as prescribed -follow up cultures -follow up ID consultation COVID test pending    GI/Nutrition GI PROPHYLAXIS as indicated DIET-->TF's as tolerated Constipation protocol as indicated   ELECTROLYTES -follow labs as needed -replace as needed -pharmacy consultation and following     DVT/GI PRX ordered TRANSFUSIONS AS NEEDED MONITOR FSBS ASSESS the need for LABS as needed     Critical Care Time devoted to patient care services described in this  note is 36 minutes.   Overall, patient is critically ill, prognosis is guarded.  Patient with Multiorgan failure and at high risk for cardiac arrest and death.    Corrin Parker, M.D.  [facility]  Medical Director [place] Director [facility] Cardio-Pulmonary Department

## 2018-11-25 NOTE — Progress Notes (Signed)
Pt was suctioned prior to extubation for a small amount of thick clear secretions. Per Dr. Clovis Fredrickson order, he was extubated without incident. He was placed on 2L nasal cannula with Sa02 at 100%.

## 2018-11-26 LAB — CBC
HCT: 31.5 % — ABNORMAL LOW (ref 39.0–52.0)
Hemoglobin: 10 g/dL — ABNORMAL LOW (ref 13.0–17.0)
MCH: 31 pg (ref 26.0–34.0)
MCHC: 31.7 g/dL (ref 30.0–36.0)
MCV: 97.5 fL (ref 80.0–100.0)
Platelets: 186 10*3/uL (ref 150–400)
RBC: 3.23 MIL/uL — ABNORMAL LOW (ref 4.22–5.81)
RDW: 17.2 % — ABNORMAL HIGH (ref 11.5–15.5)
WBC: 7 10*3/uL (ref 4.0–10.5)
nRBC: 0 % (ref 0.0–0.2)

## 2018-11-26 LAB — BASIC METABOLIC PANEL
Anion gap: 10 (ref 5–15)
BUN: 43 mg/dL — ABNORMAL HIGH (ref 8–23)
CO2: 28 mmol/L (ref 22–32)
CREATININE: 1.01 mg/dL (ref 0.61–1.24)
Calcium: 8.3 mg/dL — ABNORMAL LOW (ref 8.9–10.3)
Chloride: 102 mmol/L (ref 98–111)
GFR calc Af Amer: >60 mL/min (ref >60)
GFR calc non Af Amer: >60 mL/min (ref >60)
Glucose, Bld: 101 mg/dL — ABNORMAL HIGH (ref 70–99)
Potassium: 3.3 mmol/L — ABNORMAL LOW (ref 3.5–5.1)
Sodium: 140 mmol/L (ref 135–145)

## 2018-11-26 LAB — GLUCOSE, CAPILLARY
Glucose-Capillary: 93 mg/dL (ref 70–99)
Glucose-Capillary: 107 mg/dL — ABNORMAL HIGH (ref 70–99)
Glucose-Capillary: 131 mg/dL — ABNORMAL HIGH (ref 70–99)

## 2018-11-26 LAB — MAGNESIUM: Magnesium: 2.2 mg/dL (ref 1.7–2.4)

## 2018-11-26 LAB — PROCALCITONIN: Procalcitonin: 0.12 ng/mL

## 2018-11-26 LAB — PHOSPHORUS: Phosphorus: 2.7 mg/dL (ref 2.5–4.6)

## 2018-11-26 MED ORDER — SACUBITRIL-VALSARTAN 24-26 MG PO TABS
1.0000 | ORAL_TABLET | Freq: Two times a day (BID) | ORAL | Status: DC
  Administered 2018-11-26 – 2018-11-28 (×4): 1 via ORAL
  Filled 2018-11-26 (×4): qty 1

## 2018-11-26 MED ORDER — POTASSIUM CHLORIDE CRYS ER 20 MEQ PO TBCR
40.0000 meq | EXTENDED_RELEASE_TABLET | Freq: Once | ORAL | Status: AC
  Administered 2018-11-26: 40 meq via ORAL
  Filled 2018-11-26: qty 2

## 2018-11-26 MED ORDER — POLYETHYLENE GLYCOL 3350 17 G PO PACK
17.0000 g | PACK | ORAL | Status: DC
  Administered 2018-11-26: 17 g via ORAL
  Filled 2018-11-26 (×2): qty 1

## 2018-11-26 MED ORDER — POTASSIUM CHLORIDE 10 MEQ/100ML IV SOLN
10.0000 meq | INTRAVENOUS | Status: AC
  Administered 2018-11-26 (×2): 10 meq via INTRAVENOUS
  Filled 2018-11-26 (×2): qty 100

## 2018-11-26 MED ORDER — OCUVITE-LUTEIN PO CAPS
1.0000 | ORAL_CAPSULE | Freq: Every day | ORAL | Status: DC
  Administered 2018-11-27 – 2018-12-01 (×5): 1 via ORAL
  Filled 2018-11-26 (×5): qty 1

## 2018-11-26 MED ORDER — ADULT MULTIVITAMIN LIQUID CH: 15.0000 mL | ORAL | Status: DC

## 2018-11-26 MED ORDER — ASPIRIN 81 MG PO CHEW
81.0000 mg | CHEWABLE_TABLET | ORAL | Status: DC
  Administered 2018-11-27 – 2018-11-28 (×2): 81 mg via ORAL
  Filled 2018-11-26 (×2): qty 1

## 2018-11-26 MED ORDER — MORPHINE SULFATE (PF) 2 MG/ML IV SOLN
2.0000 mg | Freq: Once | INTRAVENOUS | Status: AC
  Administered 2018-11-26: 2 mg via INTRAVENOUS
  Filled 2018-11-26: qty 1

## 2018-11-26 MED ORDER — PANTOPRAZOLE SODIUM 40 MG PO TBEC
40.0000 mg | DELAYED_RELEASE_TABLET | ORAL | Status: DC
  Administered 2018-11-26 – 2018-11-27 (×2): 40 mg via ORAL
  Filled 2018-11-26 (×2): qty 1

## 2018-11-26 MED ORDER — QUETIAPINE FUMARATE 25 MG PO TABS
50.0000 mg | ORAL_TABLET | Freq: Every day | ORAL | Status: DC
  Administered 2018-11-26 – 2018-11-30 (×5): 50 mg via ORAL
  Filled 2018-11-26 (×5): qty 2

## 2018-11-26 NOTE — Progress Notes (Signed)
Pharmacy ICU Monitoring Consult:  Pharmacy consulted to assist in monitoring and replacing electrolytes and constipation in this 84 y.o. male admitted on 11/23/2018 with respiratory distress. Patient extubated on 3/25.   Labs:  Sodium (mmol/L)  Date Value  11/26/2018 140  12/02/2012 142   Potassium (mmol/L)  Date Value  11/26/2018 3.3 (L)  12/02/2012 3.1 (L)   Magnesium (mg/dL)  Date Value  76/14/7092 2.2   Phosphorus (mg/dL)  Date Value  95/74/7340 2.7   Calcium (mg/dL)  Date Value  37/05/6437 8.3 (L)   Calcium, Total (mg/dL)  Date Value  38/18/4037 8.2 (L)   Albumin (g/dL)  Date Value  54/36/0677 2.6 (L)  12/02/2012 3.3 (L)    Assessment/Plan:  1. Electrolytes:  Patient ordered potassium IV Q1hr x 2 doses. Patient now with diet order. Will order additional potassium PO x 1. Will replace for goal potassium ~4 and magnesium ~2. Will obtain follow up electrolytes with am labs.   2. Constipation: No documented bowel movement. Continue senna/docusate 2 tablets BID and Miralax daily.   Pharmacy will continue to monitor and adjust per consult.   Simpson,Michael L 11/26/2018 3:52 PM

## 2018-11-26 NOTE — Progress Notes (Signed)
Nutrition Follow-up  RD working remotely.  DOCUMENTATION CODES:   Obesity unspecified  INTERVENTION:  Provide Hormel Shake (Vital Cuisine) po TID with meals, each supplement provides 520 kcal and 22 grams of protein.  Provide Magic cup TID with meals, each supplement provides 290 kcal and 9 grams of protein.  Continue vitamin C 500 mg daily.  Provide Ocuvite daily for wound healing (provides zinc, vitamin A, vitamin C, Vitamin E, copper, and selenium).  NUTRITION DIAGNOSIS:   Inadequate oral intake related to inability to eat(pt sedated and ventilated ) as evidenced by NPO status.  Resolving with diet advancement.  GOAL:   Patient will meet greater than or equal to 90% of their needs  Progressing.  MONITOR:   PO intake, Supplement acceptance, Labs, Weight trends, Skin, I & O's  REASON FOR ASSESSMENT:   Ventilator, Consult Enteral/tube feeding initiation and management  ASSESSMENT:   83 yo male with PMH CAD, CHF and Pneumonia, ruling out COVID-19.   -Patient was extubated on 3/25. -Following SLP evaluation today diet was advanced to dysphagia 1 with nectar-thick liquids.  Discussed patient with SLP. Patient tolerated trials of pureed with nectar-thick liquids. However, after the few teaspoons given by RN he declined any further PO trials. Concerned for inadequate oral intake so will add oral nutrition supplements on trays. Patient is being screened for LTAC.  Medications reviewed and include: Ativan, liquid MVI daily, pantoprazole, Miralax, Seroquel, senna-docusate, vitamin C 500 mg daily, cefepime.  Labs reviewed: CBG 93-103, Potassium 3.3, BUN 43.  I/O: 1225 mL UOP yesterday (0.4 mL/kg/hr)  Weight trend: 127.4 kg on 3/25; +1.8 kg from 3/23  Diet Order:   Diet Order            DIET - DYS 1 Room service appropriate? Yes with Assist; Fluid consistency: Nectar Thick  Diet effective now             EDUCATION NEEDS:   No education needs have been identified  at this time  Skin:  Skin Assessment: Skin Integrity Issues:(ecchymosis, pressure injury to sacrum)  Last BM:  11/25/2018 - small type 5  Height:   Ht Readings from Last 1 Encounters:  11/23/18 6' (1.829 m)   Weight:   Wt Readings from Last 1 Encounters:  11/25/18 127.4 kg   Ideal Body Weight:  80.9 kg  BMI:  Body mass index is 38.09 kg/m.  Estimated Nutritional Needs:   Kcal:  2200-2400  Protein:  110-130 grams  Fluid:  2 L/day  Helane Rima, MS, RD, LDN Office: [phone] Pager: [phone] After Hours/Weekend Pager: [phone]

## 2018-11-26 NOTE — Evaluation (Signed)
Clinical/Bedside Swallow Evaluation Patient Details  Name: Dylan [name] MRN: [id number] Date of Birth: Jul 24, 1936  Today's Date: 11/26/2018 Time: SLP Start Time (ACUTE ONLY): 1035 SLP Stop Time (ACUTE ONLY): 1120 SLP Time Calculation (min) (ACUTE ONLY): 45 min  Past Medical History:  Past Medical History:  Diagnosis Date  . CAD (coronary artery disease)   . Cervicalgia   . CHF (congestive heart failure) (HCC)   . COPD (chronic obstructive pulmonary disease) (HCC)   . Diastolic heart failure (HCC)   . Foot drop, right   . History of kidney stones   . Hyperlipidemia    unspecified  . Hypertension   . Myocardial infarction (HCC)   . Osteoarthritis   . Shoulder pain, left   . Sleep apnea   . Tremor, essential    Past Surgical History:  Past Surgical History:  Procedure Laterality Date  . APPLICATION OF WOUND VAC Right 11/13/2017   Procedure: APPLICATION OF WOUND VAC;  Surgeon: Annice Needyew, Jason S, MD;  Location: [facility] ORS;  Service: Vascular;  Laterality: Right;  . BACK SURGERY  06/2010  . CARDIAC CATHETERIZATION Left 04/30/2016   Procedure: Left Heart Cath and Coronary Angiography;  Surgeon: Laurier NancyShaukat A Khan, MD;  Location: [facility] INVASIVE CV LAB;  Service: Cardiovascular;  Laterality: Left;  . COLONOSCOPY    . CORONARY ANGIOPLASTY    . CORONARY/GRAFT ACUTE MI REVASCULARIZATION N/A 06/21/2018   Procedure: Coronary/Graft Acute MI Revascularization;  Surgeon: Iran OuchArida, Muhammad A, MD;  Location: [facility] INVASIVE CV LAB;  Service: Cardiovascular;  Laterality: N/A;  . KNEE ARTHROPLASTY Left 06/01/2018   Procedure: COMPUTER ASSISTED TOTAL KNEE ARTHROPLASTY;  Surgeon: Donato HeinzHooten, James P, MD;  Location: [facility] ORS;  Service: Orthopedics;  Laterality: Left;  . KNEE ARTHROSCOPY Left   . KNEE SURGERY Left 1998  . LEFT HEART CATH AND CORONARY ANGIOGRAPHY N/A 06/21/2018   Procedure: LEFT HEART CATH AND CORONARY ANGIOGRAPHY;  Surgeon: Iran OuchArida, Muhammad A, MD;  Location: [facility] INVASIVE CV LAB;  Service:  Cardiovascular;  Laterality: N/A;  . ORIF FEMUR FRACTURE Left 08/27/2018   Procedure: OPEN REDUCTION INTERNAL FIXATION (ORIF) SUPRACONDYLAR FEMUR FRACTURE ABOVE PROSTHESIS, QUADRICEPS REPAIR;  Surgeon: Kennedy BuckerMenz, Michael, MD;  Location: [facility] ORS;  Service: Orthopedics;  Laterality: Left;  . TEE WITHOUT CARDIOVERSION N/A 11/18/2018   Procedure: TRANSESOPHAGEAL ECHOCARDIOGRAM (TEE);  Surgeon: Lamar BlinksKowalski, Bruce J, MD;  Location: [facility] ORS;  Service: Cardiovascular;  Laterality: N/A;  . TONSILLECTOMY    . WOUND DEBRIDEMENT Right 11/13/2017   Procedure: DEBRIDEMENT WOUND;  Surgeon: Annice Needyew, Jason S, MD;  Location: [facility] ORS;  Service: Vascular;  Laterality: Right;   HPI:  Patient is a 83 y/o male with past medical history significant for CHF, COPD, MI, sleep apnea, OA, essential tremor, CAD, hypertension and recent discharge from the hospital on 11/19/2018 for acute on chronic combined heart failure exacerbation and bacteremia presents to the emergency department via EMS due to shortness of breath.  The patient was in respiratory distress and was placed on nonrebreather.  Work of breathing and oxygenation required intubation upon arrival.  The patient's son reports that his father had been febrile to [address] F yesterday and has been coughing.  Chest x-ray shows pulmonary edema as well as possible bilateral basilar opacities.  Sepsis protocol was initiated as well as COVID-19 testing.  The patient also developed hypotension which prompted initiation of pressor support prior to the emergency department staff calling the hospitalist service for admission. Pt was extubated yesterday, 11/25/2018, and appears to be tolerating well per NSG report.  Assessment / Plan / Recommendation Clinical Impression  This bedside assessment was assisted by the Nurse d/t pending testing of Covid-19 results. With Supervision by SLP, Nurse attained upright positioning w/ head forward using towel rolls/bed then presented w/ po trials of Nectar  liquids via TSP then puree. No overt s/s of aspiration were noted by Nurse or SLP; O2 sats remained 99-100%, RR 22, HR low 80s - w/ no change during/post trials. NO coughing or throat clearing noted during/post trials. No observed oral phase deficits were noted; Nurse reported pt swallowed and orally cleared each trial; no anterior spillage noted and swallow/clearing appeared timely. Pt required feeding support; he appeared weak overall. OM exam movements appeared wfl w/ no unilateral weakness or asymmetry noted. Pt declined further po trials after the few given per Nurse report; noted pt put his hand up. Recommend initiation of a Dysphagia level 1(puree) w/ Nectar consistency liquids; aspiration precautions; feeding support at meals. Pills in Puree - Whole as able. ST services will f/u next 1-3 days w/ trials to upgrade diet when appropriate. NSG/MD updated.  SLP Visit Diagnosis: Dysphagia, unspecified (R13.10)    Aspiration Risk  Mild aspiration risk    Diet Recommendation  Dysphagia diet level 1 (puree) w/ NECTAR consistency liquids; aspiration precautions; feeding support at meals  Medication Administration: Whole meds with puree(for safer swallowing)    Other  Recommendations Recommended Consults: (Dietician f/u) Oral Care Recommendations: Oral care BID;Staff/trained caregiver to provide oral care Other Recommendations: Order thickener from pharmacy;Prohibited food (jello, ice cream, thin soups);Remove water pitcher;Have oral suction available   Follow up Recommendations (TBD)      Frequency and Duration min 3x week  2 weeks       Prognosis Prognosis for Safe Diet Advancement: Fair(-Good) Barriers to Reach Goals: (extended illness)      Swallow Study   General Date of Onset: 11/23/18 HPI: Patient is a 83 y/o male with past medical history significant for CHF, COPD, MI, sleep apnea, OA, essential tremor, CAD, hypertension and recent discharge from the hospital on 11/19/2018 for acute  on chronic combined heart failure exacerbation and bacteremia presents to the emergency department via EMS due to shortness of breath.  The patient was in respiratory distress and was placed on nonrebreather.  Work of breathing and oxygenation required intubation upon arrival.  The patient's son reports that his father had been febrile to 37 F yesterday and has been coughing.  Chest x-ray shows pulmonary edema as well as possible bilateral basilar opacities.  Sepsis protocol was initiated as well as COVID-19 testing.  The patient also developed hypotension which prompted initiation of pressor support prior to the emergency department staff calling the hospitalist service for admission. Pt was extubated yesterday, 11/25/2018, and appears to be tolerating well per NSG report.  Type of Study: Bedside Swallow Evaluation Previous Swallow Assessment: none reported Diet Prior to this Study: NPO Temperature Spikes Noted: No(wbc 7.0) Respiratory Status: Nasal cannula(2 liters) History of Recent Intubation: Yes Length of Intubations (days): 2 days Date extubated: 11/25/18 Behavior/Cognition: Alert;Cooperative;Pleasant mood;Distractible;Requires cueing Oral Cavity Assessment: Dry(per NSG) Oral Care Completed by SLP: Recent completion by staff(per NSG) Vision: (n/a) Self-Feeding Abilities: Needs assist;Needs set up;Total assist Patient Positioning: Postural control adequate for testing(supported w/ towel rolls) Baseline Vocal Quality: Low vocal intensity(per NSG)    Oral/Motor/Sensory Function Overall Oral Motor/Sensory Function: Within functional limits(for general movements)   Ice Chips Ice chips: Not tested   Thin Liquid Thin Liquid: Not tested    Nectar Thick  Nectar Thick Liquid: Within functional limits Presentation: Spoon(fed; 3 tsps trials) Other Comments: then switched to applesauce   Honey Thick Honey Thick Liquid: Not tested   Puree Puree: Within functional limits Presentation: Spoon(fed; 2  trials) Other Comments: then pt refused further trials   Solid     Solid: Not tested       Jerilynn Som, MS, CCC-SLP , 11/26/2018,12:12 PM

## 2018-11-26 NOTE — Consult Note (Signed)
Pharmacy Antibiotic Note  Dylan [name] is a 83 y.o. male admitted on 11/23/2018 with sepsis.  Pharmacy has been consulted for Cefepime and vancomycin dosing. Patient was recently admitted (Discharged 3/19) with acute CHF and lower respiratory infection. Patient extubated 3/25.  Plan: Continue Cefepime 2g IV every 12 hours for total of 5 days of therapy.   Height: 6' (182.9 cm) Weight: 280 lb 13.9 oz (127.4 kg) IBW/kg (Calculated) : 77.6  Temp (24hrs), Avg:99.1 F (37.3 C), Min:98.5 F (36.9 C), Max:99.5 F (37.5 C)  Recent Labs  Lab 11/23/18 0320 11/24/18 0440 11/26/18 0526  WBC 11.5* 10.1 7.0  CREATININE 1.39* 1.57* 1.01  LATICACIDVEN 1.5  --   --     Estimated Creatinine Clearance: 79.1 mL/min (by C-G formula based on SCr of 1.01 mg/dL).    No Known Allergies  Antimicrobials this admission: Vancomycin 3/23 x 1 Zosyn 3/23 x 1 Cefepime 3/23 >> 3/27  Microbiology results: 3/23 BCx: no growth x 3 days  3/23 UCx: no growth  3/23 MRSA PCR: negative  3/23 Respiratory Panel: negative  3/23 Coronovirus: pending   Thank you for allowing pharmacy to be a part of this patient's care.  Wilbur Labuda L, 11/26/2018 3:49 PM

## 2018-11-26 NOTE — TOC Progression Note (Signed)
Transition of Care [facility]) - Progression Note    Patient Details  Name: Dylan [name] MRN: [id number] Date of Birth: 08-18-1936  [facility] CM/SW Contact  Allayne Butcher, RN Phone Number: 11/26/2018, 11:07 AM  Clinical Narrative:    Discussed in rounds today with critical care team and critical care MD that the patient would be appropriate for LTAC.  RNCM reached out to patient's wife and she agrees to have the patient screened by [facility] and [facility].  [place] with [facility] and Irving Burton with [facility] notified.     Expected Discharge Plan: Long Term Acute Care (LTAC)    Expected Discharge Plan and Services Expected Discharge Plan: Long Term Acute Care (LTAC)   Discharge Planning Services: CM Consult Post Acute Care Choice: Long Term Acute Care (LTAC) Living arrangements for the past 2 months: Single Family Home                           Social Determinants of Health (SDOH) Interventions    Readmission Risk Interventions Readmission Risk Prevention Plan 11/17/2018 11/17/2018 11/17/2018  Transportation Screening - - Complete  PCP or Specialist appointment within 3-5 days of discharge Complete Patient refused -  PCP/Specialist Appt Not Complete comments - wife is nurse; Dr. Arlana Pouch agrees to sign orders without seeing patient per Jasmine December at [place] office; wife agrees to make appt. -  HRI or Home Care Consult - (No Data) Complete  SW Recovery Care/Counseling Consult - - Complete  Palliative Care Screening - - Complete  Skilled Nursing Facility - - Not Complete  SNF Comments - - patient declined  Some recent data might be hidden

## 2018-11-26 NOTE — Progress Notes (Signed)
PHARMACIST - PHYSICIAN COMMUNICATION  CONCERNING: IV to Oral Route Change Policy  RECOMMENDATION: This patient is receiving PANTOPRAZOLE by the intravenous route.  Based on criteria approved by the Pharmacy and Therapeutics Committee, the intravenous medication(s) is/are being converted to the equivalent oral dose form(s).   DESCRIPTION: These criteria include:  The patient is eating (either orally or via tube) and/or has been taking other orally administered medications for a least 24 hours  The patient has no evidence of active gastrointestinal bleeding or impaired GI absorption (gastrectomy, short bowel, patient on TNA or NPO).  If you have questions about this conversion, please contact the Pharmacy Department   []   [phone] )  [facility]  [place], [facility] 11/26/2018 3:48 PM

## 2018-11-27 LAB — BASIC METABOLIC PANEL
Anion gap: 11 (ref 5–15)
BUN: 37 mg/dL — ABNORMAL HIGH (ref 8–23)
CHLORIDE: 106 mmol/L (ref 98–111)
CO2: 24 mmol/L (ref 22–32)
Calcium: 8.7 mg/dL — ABNORMAL LOW (ref 8.9–10.3)
Creatinine, Ser: 0.95 mg/dL (ref 0.61–1.24)
GFR calc non Af Amer: >60 mL/min (ref >60)
Glucose, Bld: 125 mg/dL — ABNORMAL HIGH (ref 70–99)
Potassium: 3.5 mmol/L (ref 3.5–5.1)
Sodium: 141 mmol/L (ref 135–145)

## 2018-11-27 LAB — GLUCOSE, CAPILLARY: Glucose-Capillary: 112 mg/dL — ABNORMAL HIGH (ref 70–99)

## 2018-11-27 MED ORDER — CARVEDILOL 3.125 MG PO TABS
6.2500 mg | ORAL_TABLET | Freq: Two times a day (BID) | ORAL | Status: DC
  Administered 2018-11-27 – 2018-11-29 (×5): 6.25 mg via ORAL
  Filled 2018-11-27 (×2): qty 1
  Filled 2018-11-27: qty 2
  Filled 2018-11-27: qty 1
  Filled 2018-11-27: qty 2

## 2018-11-27 MED ORDER — POTASSIUM CHLORIDE CRYS ER 20 MEQ PO TBCR
40.0000 meq | EXTENDED_RELEASE_TABLET | Freq: Once | ORAL | Status: AC
  Administered 2018-11-27: 40 meq via ORAL
  Filled 2018-11-27: qty 2

## 2018-11-27 NOTE — Progress Notes (Signed)
Pharmacy ICU Monitoring Consult:  Pharmacy consulted to assist in monitoring and replacing electrolytes and constipation in this 83 y.o. male admitted on 11/23/2018 with respiratory distress. Patient extubated on 3/25.   Labs:  Sodium (mmol/L)  Date Value  11/27/2018 141  12/02/2012 142   Potassium (mmol/L)  Date Value  11/27/2018 3.5  12/02/2012 3.1 (L)   Magnesium (mg/dL)  Date Value  04/54/0981 2.2   Phosphorus (mg/dL)  Date Value  19/14/7829 2.7   Calcium (mg/dL)  Date Value  56/21/3086 8.7 (L)   Calcium, Total (mg/dL)  Date Value  57/84/6962 8.2 (L)   Albumin (g/dL)  Date Value  95/28/4132 2.6 (L)  12/02/2012 3.3 (L)    Assessment/Plan:  1. Electrolytes:  K 3.5, will order KCl PO x 1. Will replace for goal potassium ~4 and magnesium ~2. Will obtain follow up electrolytes with am labs.   2. Constipation: No documented bowel movement. Continue senna/docusate 2 tablets BID and Miralax daily.   Pharmacy will continue to monitor and adjust per consult.   Clovia Cuff, PharmD, BCPS 11/27/2018 1:35 PM

## 2018-11-28 ENCOUNTER — Other Ambulatory Visit: Payer: Self-pay

## 2018-11-28 ENCOUNTER — Inpatient Hospital Stay: Payer: Medicare Other

## 2018-11-28 LAB — GLUCOSE, CAPILLARY
Glucose-Capillary: 124 mg/dL — ABNORMAL HIGH (ref 70–99)
Glucose-Capillary: 125 mg/dL — ABNORMAL HIGH (ref 70–99)
Glucose-Capillary: 140 mg/dL — ABNORMAL HIGH (ref 70–99)
Glucose-Capillary: 143 mg/dL — ABNORMAL HIGH (ref 70–99)
Glucose-Capillary: 145 mg/dL — ABNORMAL HIGH (ref 70–99)
Glucose-Capillary: 151 mg/dL — ABNORMAL HIGH (ref 70–99)
Glucose-Capillary: 156 mg/dL — ABNORMAL HIGH (ref 70–99)

## 2018-11-28 LAB — CULTURE, BLOOD (ROUTINE X 2)
CULTURE: NO GROWTH
Culture: NO GROWTH
SPECIAL REQUESTS: ADEQUATE
Special Requests: ADEQUATE

## 2018-11-28 LAB — BASIC METABOLIC PANEL
Anion gap: 9 (ref 5–15)
BUN: 37 mg/dL — ABNORMAL HIGH (ref 8–23)
CO2: 26 mmol/L (ref 22–32)
Calcium: 9 mg/dL (ref 8.9–10.3)
Chloride: 109 mmol/L (ref 98–111)
Creatinine, Ser: 0.9 mg/dL (ref 0.61–1.24)
GFR calc Af Amer: >60 mL/min (ref >60)
GFR calc non Af Amer: >60 mL/min (ref >60)
Glucose, Bld: 148 mg/dL — ABNORMAL HIGH (ref 70–99)
Potassium: 3.7 mmol/L (ref 3.5–5.1)
Sodium: 144 mmol/L (ref 135–145)

## 2018-11-28 LAB — NOVEL CORONAVIRUS, NAA (HOSPITAL ORDER, SEND-OUT TO REF LAB)

## 2018-11-28 LAB — MAGNESIUM: Magnesium: 2.3 mg/dL (ref 1.7–2.4)

## 2018-11-28 LAB — PHOSPHORUS: Phosphorus: 1.4 mg/dL — ABNORMAL LOW (ref 2.5–4.6)

## 2018-11-28 LAB — NOVEL CORONAVIRUS, NAA (HOSP ORDER, SEND-OUT TO REF LAB; TAT 18-24 HRS): SARS-CoV-2, NAA: NOT DETECTED

## 2018-11-28 MED ORDER — SPIRONOLACTONE 25 MG PO TABS
12.5000 mg | ORAL_TABLET | Freq: Every day | ORAL | Status: DC
  Administered 2018-11-28 – 2018-12-01 (×4): 12.5 mg via ORAL
  Filled 2018-11-28: qty 0.5
  Filled 2018-11-28 (×3): qty 1
  Filled 2018-11-28 (×3): qty 0.5

## 2018-11-28 MED ORDER — CLOPIDOGREL BISULFATE 75 MG PO TABS
75.0000 mg | ORAL_TABLET | Freq: Every day | ORAL | Status: DC
  Administered 2018-11-29 – 2018-12-01 (×3): 75 mg via ORAL
  Filled 2018-11-28 (×3): qty 1

## 2018-11-28 MED ORDER — SENNOSIDES-DOCUSATE SODIUM 8.6-50 MG PO TABS: 2.0000 | ORAL_TABLET | Freq: Two times a day (BID) | ORAL | Status: DC

## 2018-11-28 MED ORDER — HYDRALAZINE HCL 20 MG/ML IJ SOLN
INTRAMUSCULAR | Status: AC
  Administered 2018-11-28: 10 mg via INTRAVENOUS
  Filled 2018-11-28: qty 1

## 2018-11-28 MED ORDER — HYDRALAZINE HCL 20 MG/ML IJ SOLN
10.0000 mg | Freq: Once | INTRAMUSCULAR | Status: AC
  Administered 2018-11-28: 10 mg via INTRAVENOUS
  Filled 2018-11-28: qty 1

## 2018-11-28 MED ORDER — FENTANYL CITRATE (PF) 100 MCG/2ML IJ SOLN: 12.5000 ug | Freq: Once | INTRAMUSCULAR | Status: DC

## 2018-11-28 MED ORDER — FENTANYL CITRATE (PF) 100 MCG/2ML IJ SOLN
12.5000 ug | INTRAMUSCULAR | Status: DC | PRN
  Administered 2018-11-28 – 2018-11-29 (×3): 12.5 ug via INTRAVENOUS
  Filled 2018-11-28 (×2): qty 2

## 2018-11-28 MED ORDER — FENTANYL CITRATE (PF) 100 MCG/2ML IJ SOLN
INTRAMUSCULAR | Status: AC
  Administered 2018-11-28: 12.5 ug via INTRAVENOUS
  Filled 2018-11-28: qty 2

## 2018-11-28 MED ORDER — POTASSIUM & SODIUM PHOSPHATES 280-160-250 MG PO PACK
2.0000 | PACK | Freq: Once | ORAL | Status: DC
  Filled 2018-11-28: qty 2

## 2018-11-28 MED ORDER — ATORVASTATIN CALCIUM 20 MG PO TABS
80.0000 mg | ORAL_TABLET | Freq: Every day | ORAL | Status: DC
  Administered 2018-11-28 – 2018-11-30 (×3): 80 mg via ORAL
  Filled 2018-11-28 (×4): qty 4

## 2018-11-28 MED ORDER — PANTOPRAZOLE SODIUM 40 MG PO TBEC
40.0000 mg | DELAYED_RELEASE_TABLET | ORAL | Status: DC
  Administered 2018-11-28 – 2018-11-30 (×3): 40 mg via ORAL
  Filled 2018-11-28 (×3): qty 1

## 2018-11-28 MED ORDER — ENOXAPARIN SODIUM 40 MG/0.4ML ~~LOC~~ SOLN
40.0000 mg | SUBCUTANEOUS | Status: DC
  Administered 2018-11-28: 18:00:00 40 mg via SUBCUTANEOUS
  Filled 2018-11-28: qty 0.4

## 2018-11-28 MED ORDER — SENNOSIDES-DOCUSATE SODIUM 8.6-50 MG PO TABS
1.0000 | ORAL_TABLET | Freq: Two times a day (BID) | ORAL | Status: DC
  Administered 2018-11-28 – 2018-11-29 (×2): 1 via ORAL
  Filled 2018-11-28 (×2): qty 1

## 2018-11-28 MED ORDER — ASPIRIN EC 81 MG PO TBEC
81.0000 mg | DELAYED_RELEASE_TABLET | Freq: Every day | ORAL | Status: DC
  Administered 2018-11-28: 81 mg via ORAL
  Filled 2018-11-28: qty 1

## 2018-11-28 MED ORDER — SACUBITRIL-VALSARTAN 24-26 MG PO TABS
1.0000 | ORAL_TABLET | Freq: Two times a day (BID) | ORAL | Status: DC
  Administered 2018-11-28 – 2018-12-01 (×6): 1 via ORAL
  Filled 2018-11-28 (×7): qty 1

## 2018-11-28 MED ORDER — VITAMIN C 500 MG PO TABS
500.0000 mg | ORAL_TABLET | Freq: Every day | ORAL | Status: DC
  Administered 2018-11-29 – 2018-12-01 (×3): 500 mg via ORAL
  Filled 2018-11-28 (×3): qty 1

## 2018-11-28 MED ORDER — ISOSORBIDE MONONITRATE ER 30 MG PO TB24
30.0000 mg | ORAL_TABLET | Freq: Every day | ORAL | Status: DC
  Administered 2018-11-28 – 2018-12-01 (×4): 30 mg via ORAL
  Filled 2018-11-28 (×4): qty 1

## 2018-11-28 MED ORDER — POTASSIUM PHOSPHATES 15 MMOLE/5ML IV SOLN
30.0000 mmol | Freq: Once | INTRAVENOUS | Status: AC
  Administered 2018-11-28: 30 mmol via INTRAVENOUS
  Filled 2018-11-28: qty 10

## 2018-11-28 MED ORDER — HYDRALAZINE HCL 20 MG/ML IJ SOLN
10.0000 mg | INTRAMUSCULAR | Status: DC | PRN
  Administered 2018-11-28 (×2): 10 mg via INTRAVENOUS
  Filled 2018-11-28: qty 1

## 2018-11-28 NOTE — Consult Note (Signed)
Pharmacy Antibiotic Note  Dylan [name] is a 83 y.o. male admitted on 11/23/2018 with sepsis.  Pharmacy has been consulted for Cefepime and vancomycin dosing. Patient was recently admitted (Discharged 3/19) with acute CHF and lower respiratory infection. Patient extubated 3/25.  Plan: Patient's cultures remain negative, chest Xray unremarkable, procalcitonin on 3/26 0.12. Reconfirmed with intensivist stop date should have been 5 days. Today is day 6 of therapy per discussion with intensivist will discontinue cefepime.   Height: 6' (182.9 cm) Weight: 264 lb 8.8 oz (120 kg) IBW/kg (Calculated) : 77.6  Temp (24hrs), Avg:99.4 F (37.4 C), Min:98.9 F (37.2 C), Max:99.8 F (37.7 C)  Recent Labs  Lab 11/23/18 0320 11/24/18 0440 11/26/18 0526 11/27/18 0621 11/28/18 0431  WBC 11.5* 10.1 7.0  --   --   CREATININE 1.39* 1.57* 1.01 0.95 0.90  LATICACIDVEN 1.5  --   --   --   --     Estimated Creatinine Clearance: 86.1 mL/min (by C-G formula based on SCr of 0.9 mg/dL).    No Known Allergies  Antimicrobials this admission: Vancomycin 3/23 x 1 Zosyn 3/23 x 1 Cefepime 3/23 >> 3/28  Microbiology results: 3/23 BCx: no growth 3/23 UCx: no growth  3/23 MRSA PCR: negative  3/23 Respiratory Panel: negative  3/23 Coronovirus: negative   Thank you for allowing pharmacy to be a part of this patient's care.  Simpson,Michael L, 11/28/2018 9:39 AM

## 2018-11-28 NOTE — Progress Notes (Signed)
Chart reviewed. Pts. diet was changed to a soft diet. Pt. Is no longer on isolation precautions. Lunch tray at bedside but Pt refused to eat. Eventually Pt took 2 sips of thin liquid and did not present with s/s of aspiration. ST to reattempt when Pt is more willing to participate. Continue with aspiration precautions.

## 2018-11-28 NOTE — Progress Notes (Signed)
Patient is oriented to self and location only.  He is confused and forgets quickly.  His result was negative for covid test, so he is on contact precautions for MRSA  in the blood.  MD confirmed to leave these precautions.  Patient is on room air at 97% Respirations ~25.  BP goal is < 150.  Patient has multiple skin issues - see chart.  Patient tried to get up multiple times.  Tele monitor was being used.  Camera was taken with the patient to his new room.  Christean Grief, RN

## 2018-11-28 NOTE — Progress Notes (Addendum)
[employer] - [place] at [facility]   PATIENT NAME: Dylan [name]    MR#:  [id number]  DATE OF BIRTH:  19-Apr-1936  SUBJECTIVE:  CHIEF COMPLAINT:   Chief Complaint  Patient presents with  . Respiratory Distress  Patient is extubated, doing well, for transfer to the floor later today per intensivist, coronavirus testing was negative  REVIEW OF SYSTEMS:  Review of Systems  Unable to perform ROS: Intubated    DRUG ALLERGIES:  No Known Allergies VITALS:  Blood pressure (!) 170/95, pulse 89, temperature 99.3 F (37.4 C), temperature source Oral, resp. rate (!) 28, height 6' (1.829 m), weight 120 kg, SpO2 94 %. PHYSICAL EXAMINATION:  Physical Exam HENT:     Head: Normocephalic.  Eyes:     General: No scleral icterus.    Conjunctiva/sclera: Conjunctivae normal.  Neck:     Musculoskeletal: Neck supple.     Vascular: No JVD.     Trachea: No tracheal deviation.  Cardiovascular:     Rate and Rhythm: Normal rate and regular rhythm.     Heart sounds: Normal heart sounds. No murmur. No gallop.   Pulmonary:     Effort: No respiratory distress.     Breath sounds: Rhonchi and rales present. No wheezing.  Abdominal:     General: Bowel sounds are normal. There is no distension.     Palpations: Abdomen is soft.     Tenderness: There is no abdominal tenderness. There is no rebound.  Musculoskeletal:        General: No tenderness.  Skin:    Findings: No erythema or rash.  Neurological:     Comments: Unable to exam.    LABORATORY PANEL:  Male CBC Recent Labs  Lab 11/26/18 0526  WBC 7.0  HGB 10.0*  HCT 31.5*  PLT 186   ------------------------------------------------------------------------------------------------------------------ Chemistries  Recent Labs  Lab 11/23/18 0320  11/28/18 0431  NA 141   < > 144  K 4.4   < > 3.7  CL 100   < > 109  CO2 31   < > 26  GLUCOSE 130*   < > 148*  BUN 29*   < > 37*  CREATININE 1.39*   < > 0.90  CALCIUM 8.5*    < > 9.0  MG  --    < > 2.3  AST 63*  --   --   ALT 50*  --   --   ALKPHOS 103  --   --   BILITOT 0.6  --   --    < > = values in this interval not displayed.   RADIOLOGY:  Dg Chest Port 1 View  Result Date: 11/28/2018 CLINICAL DATA:  Acute respiratory failure EXAM: PORTABLE CHEST 1 VIEW COMPARISON:  11/23/2018 FINDINGS: Interval extubation and removal of enteric tube. No frank interstitial edema. Moderate left and small right pleural effusions. No pneumothorax. Heart is top-normal in size. IMPRESSION: Interval extubation. No frank interstitial edema. Moderate left and small right pleural effusions, improved. Electronically Signed   By: Charline Bills M.D.   On: 11/28/2018 05:33   ASSESSMENT AND PLAN:  This is an 83 year old male admitted for acute respiratory failure  *Acute Respiratory failure with hypoxemia Resolved Secondary to pulmonary edema plus or minus underlying inflammatory process.  The patient was extubated this morning, continue oxygen by nasal cannula.  NEB.    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 Management plans discussed with the patient, family and they are in agreement.  CODE STATUS: Full Code  TOTAL TIME TAKING CARE OF THIS PATIENT: 35 minutes.   More than 50% of the time was spent in  counseling/coordination of care: YES  POSSIBLE D/C IN 1-3 DAYS, DEPENDING ON CLINICAL CONDITION.   [name] Blondell Laperle M.D on 11/28/2018 at 12:56 PM  Between 7am to 6pm - Pager - [phone]  After 6pm go to [email or web address] - Therapist, nutritional Hospitalists

## 2018-11-28 NOTE — Progress Notes (Signed)
Pharmacy ICU Monitoring Consult:  Pharmacy consulted to assist in monitoring and replacing electrolytes and constipation in this 83 y.o. male admitted on 11/23/2018 with respiratory distress. Patient extubated on 3/25.   Labs:  Sodium (mmol/L)  Date Value  11/28/2018 144  12/02/2012 142   Potassium (mmol/L)  Date Value  11/28/2018 3.7  12/02/2012 3.1 (L)   Magnesium (mg/dL)  Date Value  36/14/4315 2.3   Phosphorus (mg/dL)  Date Value  40/04/6760 1.4 (L)   Calcium (mg/dL)  Date Value  95/05/3266 9.0   Calcium, Total (mg/dL)  Date Value  12/45/8099 8.2 (L)   Albumin (g/dL)  Date Value  83/38/2505 2.6 (L)  12/02/2012 3.3 (L)    Assessment/Plan:  1. Electrolytes:  Will order potassium phosphate IV x 1. Will monitor potassium closely as patients spirinolactone has been resumed. Will replace for goal potassium ~4, magnesium ~2, phosphorus > 2.5. Will obtain follow up electrolytes with am labs.   2. Constipation: Patient with documented bowel movement on 3/27. Will discontinue Miralax and decrease senna/docusate to 1 tab BID.   Pharmacy will continue to monitor and adjust per consult.   Simpson,Michael L 11/28/2018 9:45 AM

## 2018-11-29 ENCOUNTER — Inpatient Hospital Stay: Payer: Medicare Other

## 2018-11-29 LAB — BLOOD GAS, ARTERIAL
Acid-Base Excess: 5.1 mmol/L — ABNORMAL HIGH (ref 0.0–2.0)
Allens test (pass/fail): POSITIVE — AB
Bicarbonate: 27.8 mmol/L (ref 20.0–28.0)
FIO2: 0.28
O2 Saturation: 97.9 %
Patient temperature: 37
pCO2 arterial: 34 mmHg (ref 32.0–48.0)
pH, Arterial: 7.52 — ABNORMAL HIGH (ref 7.350–7.450)
pO2, Arterial: 92 mmHg (ref 83.0–108.0)

## 2018-11-29 LAB — PHOSPHORUS: Phosphorus: 3.2 mg/dL (ref 2.5–4.6)

## 2018-11-29 LAB — MAGNESIUM: Magnesium: 2.3 mg/dL (ref 1.7–2.4)

## 2018-11-29 LAB — GLUCOSE, CAPILLARY
Glucose-Capillary: 123 mg/dL — ABNORMAL HIGH (ref 70–99)
Glucose-Capillary: 134 mg/dL — ABNORMAL HIGH (ref 70–99)
Glucose-Capillary: 123 mg/dL — ABNORMAL HIGH (ref 70–99)
Glucose-Capillary: 125 mg/dL — ABNORMAL HIGH (ref 70–99)

## 2018-11-29 LAB — BASIC METABOLIC PANEL
Anion gap: 10 (ref 5–15)
BUN: 41 mg/dL — ABNORMAL HIGH (ref 8–23)
CO2: 24 mmol/L (ref 22–32)
Calcium: 8.9 mg/dL (ref 8.9–10.3)
Chloride: 107 mmol/L (ref 98–111)
Creatinine, Ser: 1.15 mg/dL (ref 0.61–1.24)
GFR calc Af Amer: >60 mL/min (ref >60)
GFR, EST NON AFRICAN AMERICAN: 59 mL/min — AB (ref >60)
Glucose, Bld: 154 mg/dL — ABNORMAL HIGH (ref 70–99)
Potassium: 4.2 mmol/L (ref 3.5–5.1)
Sodium: 141 mmol/L (ref 135–145)

## 2018-11-29 LAB — AMMONIA: Ammonia: 19 umol/L (ref 9–35)

## 2018-11-29 MED ORDER — SODIUM CHLORIDE 0.9% FLUSH
3.0000 mL | INTRAVENOUS | Status: DC | PRN
  Administered 2018-11-29 (×3): 3 mL via INTRAVENOUS
  Filled 2018-11-29 (×2): qty 3

## 2018-11-29 MED ORDER — IPRATROPIUM-ALBUTEROL 0.5-2.5 (3) MG/3ML IN SOLN
3.0000 mL | Freq: Four times a day (QID) | RESPIRATORY_TRACT | Status: DC
  Administered 2018-11-29 – 2018-12-01 (×8): 3 mL via RESPIRATORY_TRACT
  Filled 2018-11-29 (×8): qty 3

## 2018-11-29 MED ORDER — FUROSEMIDE 40 MG PO TABS: 40.0000 mg | ORAL_TABLET | Freq: Two times a day (BID) | ORAL | Status: DC

## 2018-11-29 MED ORDER — CLONAZEPAM 0.5 MG PO TABS
0.5000 mg | ORAL_TABLET | Freq: Two times a day (BID) | ORAL | Status: DC
  Administered 2018-11-29: 0.5 mg via ORAL
  Filled 2018-11-29: qty 1

## 2018-11-29 MED ORDER — SENNOSIDES-DOCUSATE SODIUM 8.6-50 MG PO TABS: 1.0000 | ORAL_TABLET | Freq: Two times a day (BID) | ORAL | Status: DC | PRN

## 2018-11-29 MED ORDER — METHYLPREDNISOLONE SODIUM SUCC 125 MG IJ SOLR
125.0000 mg | Freq: Once | INTRAMUSCULAR | Status: AC
  Administered 2018-11-29: 125 mg via INTRAVENOUS
  Filled 2018-11-29: qty 2

## 2018-11-29 MED ORDER — FUROSEMIDE 40 MG PO TABS
40.0000 mg | ORAL_TABLET | Freq: Every day | ORAL | Status: DC
  Administered 2018-11-29: 09:00:00 40 mg via ORAL
  Filled 2018-11-29: qty 1

## 2018-11-29 MED ORDER — MORPHINE SULFATE (PF) 2 MG/ML IV SOLN: 2.0000 mg | Freq: Once | INTRAVENOUS | Status: DC

## 2018-11-29 MED ORDER — HYDRALAZINE HCL 20 MG/ML IJ SOLN
10.0000 mg | INTRAMUSCULAR | Status: DC | PRN
  Administered 2018-11-29: 15:00:00 10 mg via INTRAVENOUS
  Filled 2018-11-29: qty 1

## 2018-11-29 MED ORDER — SODIUM CHLORIDE 0.9% FLUSH
3.0000 mL | Freq: Two times a day (BID) | INTRAVENOUS | Status: DC
  Administered 2018-11-29 – 2018-12-01 (×5): 3 mL via INTRAVENOUS

## 2018-11-29 MED ORDER — CARVEDILOL 3.125 MG PO TABS
12.5000 mg | ORAL_TABLET | Freq: Two times a day (BID) | ORAL | Status: DC
  Administered 2018-11-29: 12.5 mg via ORAL
  Filled 2018-11-29: qty 4

## 2018-11-29 MED ORDER — METOLAZONE 5 MG PO TABS
5.0000 mg | ORAL_TABLET | Freq: Once | ORAL | Status: AC
  Administered 2018-11-29: 5 mg via ORAL
  Filled 2018-11-29: qty 1

## 2018-11-29 MED ORDER — MORPHINE SULFATE (PF) 2 MG/ML IV SOLN: 2.0000 mg | INTRAVENOUS | Status: DC | PRN

## 2018-11-29 MED ORDER — FUROSEMIDE 10 MG/ML IJ SOLN
40.0000 mg | Freq: Two times a day (BID) | INTRAMUSCULAR | Status: DC
  Administered 2018-11-29 – 2018-11-30 (×2): 40 mg via INTRAVENOUS
  Filled 2018-11-29 (×2): qty 4

## 2018-11-29 MED ORDER — CLONAZEPAM 0.5 MG PO TABS
0.5000 mg | ORAL_TABLET | Freq: Two times a day (BID) | ORAL | Status: DC | PRN
  Administered 2018-11-29: 0.5 mg via ORAL
  Filled 2018-11-29: qty 1

## 2018-11-29 MED ORDER — BUDESONIDE 0.5 MG/2ML IN SUSP
0.5000 mg | Freq: Two times a day (BID) | RESPIRATORY_TRACT | Status: DC
  Administered 2018-11-29 – 2018-12-01 (×4): 0.5 mg via RESPIRATORY_TRACT
  Filled 2018-11-29 (×4): qty 2

## 2018-11-29 NOTE — Progress Notes (Signed)
Pt too lethargic to perform Flutter valve properly

## 2018-11-29 NOTE — Progress Notes (Signed)
BP improved. Respirations easier in low 30's without distress  Nonproductive cough has decreased.  Pt alert, confused and more at baseline.

## 2018-11-29 NOTE — Progress Notes (Signed)
Respirations 40 without acute distress. 02 @ 2l/Copan applied. MD notified with vs's and change in condition. ABG done/ portable CXR being done. Will reassess vs's while waiting on study results.

## 2018-11-29 NOTE — Progress Notes (Signed)
Pt is total care. Diminished breath sounds with respirations in the 28-30's without distress. MD advises he plans thoracentesis tomorrow for pleural effusion. Turned every  2 hours; wife updated on phone.

## 2018-11-29 NOTE — Progress Notes (Signed)
New orders carried out with MD reviewing ABD/CXR/ VS's.  Lasix IV and solumedrol IV and zaroxolyn po given; Duoneb completed. Respirations now in low 30s'/ MD aware of VS's with elevated BP- advised to continue to assess pt./ respiratory status.

## 2018-11-29 NOTE — Progress Notes (Signed)
Patient confused at times. Resting comfortable, however, respirations in the high 30-40. Does not seem to be in distress, no shortness of breath or chest pain. MD Avaiya aware, no new orders. Will continue to monitor.

## 2018-11-29 NOTE — Progress Notes (Addendum)
[employer] - [place] at [facility]   PATIENT NAME: Dylan [name]    MR#:  [id number]  DATE OF BIRTH:  Dec 05, 1935  SUBJECTIVE:  CHIEF COMPLAINT:   Chief Complaint  Patient presents with  . Respiratory Distress  mild lethargy noted, talking appropriatedly, CXR noted - for thoracentesis in am, PT input noted  REVIEW OF SYSTEMS:  Review of Systems  Unable to perform ROS: Intubated    DRUG ALLERGIES:  No Known Allergies VITALS:  Blood pressure (!) 156/115, pulse 85, temperature 98.2 F (36.8 C), temperature source Oral, resp. rate (!) 30, height 6' (1.829 m), weight 127.3 kg, SpO2 99 %. PHYSICAL EXAMINATION:  Physical Exam HENT:     Head: Normocephalic.  Eyes:     General: No scleral icterus.    Conjunctiva/sclera: Conjunctivae normal.  Neck:     Musculoskeletal: Neck supple.     Vascular: No JVD.     Trachea: No tracheal deviation.  Cardiovascular:     Rate and Rhythm: Normal rate and regular rhythm.     Heart sounds: Normal heart sounds. No murmur. No gallop.   Pulmonary:     Effort: No respiratory distress.     Breath sounds: Rhonchi and rales present. No wheezing.  Abdominal:     General: Bowel sounds are normal. There is no distension.     Palpations: Abdomen is soft.     Tenderness: There is no abdominal tenderness. There is no rebound.  Musculoskeletal:        General: No tenderness.  Skin:    Findings: No erythema or rash.  Neurological:     Comments: Unable to exam.    LABORATORY PANEL:  Male CBC Recent Labs  Lab 11/26/18 0526  WBC 7.0  HGB 10.0*  HCT 31.5*  PLT 186   ------------------------------------------------------------------------------------------------------------------ Chemistries  Recent Labs  Lab 11/23/18 0320  11/28/18 0431  NA 141   < > 144  K 4.4   < > 3.7  CL 100   < > 109  CO2 31   < > 26  GLUCOSE 130*   < > 148*  BUN 29*   < > 37*  CREATININE 1.39*   < > 0.90  CALCIUM 8.5*   < > 9.0  MG  --    <  > 2.3  AST 63*  --   --   ALT 50*  --   --   ALKPHOS 103  --   --   BILITOT 0.6  --   --    < > = values in this interval not displayed.   RADIOLOGY:  No results found. 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 Management plans discussed with the patient, family and they are in agreement.  CODE STATUS: Full Code  TOTAL TIME TAKING CARE OF THIS PATIENT: 35 minutes.   More than 50% of the time was spent in counseling/coordination of care: YES  POSSIBLE D/C IN 1-3 DAYS, DEPENDING ON CLINICAL CONDITION.   [name] [name] M.D on 11/29/2018 at 11:01 AM  Between 7am to 6pm - Pager - [phone]  After 6pm go to [email or web address] - Therapist, nutritional Hospitalists

## 2018-11-29 NOTE — Evaluation (Signed)
Physical Therapy Evaluation Patient Details Name: Dylan [name] MRN: [id number] DOB: 18-May-1936 Today's Date: 11/29/2018   History of Present Illness  Dylan [name] is an 83yo male who comes to [facility] on 3/23 c SOB, hypoxia, fever, coughing (covid-19 test negative), required intubation inititally, extubated to 4L on 3/25. PMH: CAD, MI, COPD, Rt Foot Drop, Ct TKA 2019, Lt femur fracture (Dec 2019) s/p ORIF/quads repair (no longer under precautions). Pt has not successfully AMB since Dec, previously wa sminimally AMB at home, has recently been total care with 24/7 caregiver assistance.   Clinical Impression  Pt admitted with above diagnosis. Pt currently with functional limitations due to the deficits listed below (see "PT Problem List"). Upon entry, pt in bed asleep, no family/caregiver present. The pt is easily awakened and agreeable to participate. The pt is drowsy throughout, eyes closed >90% of the time, oriented to self, pleasant, minimally conversational, and following simple commands consistently when physically able. Pt is profoundly weak in all 4 limbs, and his trunk weakness limits the ability to reposition self in bed even with assistance. Functional mobility assessment demonstrates total physical assist requirement and poor tolerance, whereas the patient performed these at a higher level of independence PTA. In spite of ambulatory status prior to December 2019, pt has struggled to return to standing since. A higher frequency of rehab services offered in a STR setting would offer the best potential for return to PLOF at this point. It is unclear if the patient would favor this at this time. Should he decline, a transition back to home should be easy, as all DME and caregiver services were in place PTA. Pt will benefit from skilled PT intervention to increase independence and safety with basic mobility in preparation for discharge to the venue listed below.        Follow Up Recommendations SNF(rehab  frequency available at home likely insufficient for rapid return to [place]. )    Equipment Recommendations  None recommended by PT(pt has all DME needs met)    Recommendations for Other Services       Precautions / Restrictions Precautions Precautions: Fall Precaution Comments: Contact Isolation Precautions Other Brace: Per Dr. Rudene Christians pt does not need to wear his brace any longer but may continue to use it for safety purposes if he can not support his weight without buckling Restrictions LLE Weight Bearing: Weight bearing as tolerated      Mobility  Bed Mobility Overal bed mobility: Needs Assistance Bed Mobility: Rolling Rolling: +2 for physical assistance;Total assist(cued to assist in repositioning, but unable to provide any meaningful assistance)         General bed mobility comments: additional assistance not attempted as pt has difficulty maintaining eyes open  Transfers Overall transfer level: (Pt uses mechanical lift for transfers at baseline. )                  Ambulation/Gait                Stairs            Wheelchair Mobility    Modified Rankin (Stroke Patients Only)       Balance                                             Pertinent Vitals/Pain Pain Assessment: No/denies pain    Home Living Family/patient expects  to be discharged to:: Private residence Living Arrangements: Spouse/significant other Available Help at Discharge: Family;Neighbor;Personal care attendant;Other (Comment) Type of Home: House Home Access: Ramped entrance     Home Layout: One level Home Equipment: [place] - 2 wheels;Cane - single point;Bedside commode;Shower seat;Grab bars - tub/shower;Wheelchair - Education administrator (comment);Hospital bed(hoyer lift)      Prior Function Level of Independence: Needs assistance   Gait / Transfers Assistance Needed: Per patient, he has not been able to stand up or ambulate since his L femur ORIF and quad  repair December of 2019.  Pt requires a hoyer lift for transfers.   ADL's / Homemaking Assistance Needed: Total assistance with ADLs from his PCA  Comments: Pt has an Rt AFO for foot drop      Hand Dominance   Dominant Hand: Right    Extremity/Trunk Assessment   Upper Extremity Assessment Upper Extremity Assessment: Generalized weakness(requires >10seconds to bring fingers to nose bilat, with poor accuracyin proprioception)    Lower Extremity Assessment Lower Extremity Assessment: Generalized weakness RLE Deficits / Details: Lt knee P/ROM very limited at this time, likely 2/2 recent postoperative immobilization        Communication      Cognition Arousal/Alertness: Lethargic Behavior During Therapy: WFL for tasks assessed/performed Overall Cognitive Status: Within Functional Limits for tasks assessed                                        General Comments      Exercises General Exercises - Lower Extremity Ankle Circles/Pumps: AROM;Left;10 reps(History of Rt foot drop) Heel Slides: PROM;AAROM;Both;10 reps(cued, but too weak to provide any meaningful assistance ) Hip ABduction/ADduction: AAROM;PROM;Both;5 reps(cued, but too weak to provide any meaningful assistance )   Assessment/Plan    PT Assessment Patient needs continued PT services  PT Problem List Decreased strength;Decreased range of motion;Decreased activity tolerance;Decreased balance;Decreased mobility;Decreased knowledge of precautions       PT Treatment Interventions DME instruction;Functional mobility training;Therapeutic activities;Therapeutic exercise;Balance training;Patient/family education    PT Goals (Current goals can be found in the Care Plan section)  Acute Rehab PT Goals Patient Stated Goal: To improve strength and to be able to do more for himself PT Goal Formulation: With patient Time For Goal Achievement: 12/13/18 Potential to Achieve Goals: Fair    Frequency Min  2X/week   Barriers to discharge        Co-evaluation               AM-PAC PT "6 Clicks" Mobility  Outcome Measure Help needed turning from your back to your side while in a flat bed without using bedrails?: Total Help needed moving from lying on your back to sitting on the side of a flat bed without using bedrails?: Total Help needed moving to and from a bed to a chair (including a wheelchair)?: Total Help needed standing up from a chair using your arms (e.g., wheelchair or bedside chair)?: Total Help needed to walk in hospital room?: Total Help needed climbing 3-5 steps with a railing? : Total 6 Click Score: 6    End of Session   Activity Tolerance: Patient limited by fatigue;Patient limited by lethargy Patient left: in bed;with call bell/phone within reach;with bed alarm set;with SCD's reapplied;with chair alarm set;Other (comment)(feet elevated) Nurse Communication: Mobility status;Need for lift equipment PT Visit Diagnosis: Muscle weakness (generalized) (M62.81);Other abnormalities of gait and mobility (R26.89)  Time: 1008-1030 PT Time Calculation (min) (ACUTE ONLY): 22 min   Charges:   PT Evaluation $PT Eval Moderate Complexity: 1 Mod PT Treatments $Therapeutic Exercise: 8-22 mins        10:59 AM, 11/29/18 Etta Grandchild, PT, DPT Physical Therapist - [facility]  [phone] ([place])     Ricci Dirocco C 11/29/2018, 10:49 AM

## 2018-11-29 NOTE — Progress Notes (Addendum)
Pharmacy ICU Monitoring Consult:  Pharmacy consulted to assist in monitoring and replacing electrolytes and constipation in this 83 y.o. male admitted on 11/23/2018 with respiratory distress. Patient extubated on 3/25.   Labs:  Sodium (mmol/[name])  Date Value  11/28/2018 144  12/02/2012 142   Potassium (mmol/[name])  Date Value  11/28/2018 3.7  12/02/2012 3.1 ([name])   Magnesium (mg/dL)  Date Value  21/07/5519 2.3   Phosphorus (mg/dL)  Date Value  80/22/3361 1.4 ([name])   Calcium (mg/dL)  Date Value  22/44/9753 9.0   Calcium, Total (mg/dL)  Date Value  00/51/1021 8.2 ([name])   Albumin (g/dL)  Date Value  11/73/5670 2.6 ([name])  12/02/2012 3.3 ([name])    Assessment/Plan:  1. Electrolytes: No replacement warranted. Patient transferred to the floor. Patient remains on furosemide 40mg  PO BID and spironolactone 12.5mg  daily. Will recheck electrolytes with am labs. Will replace to maintain electrolytes within normal limits.   2. Constipation: Patient with documented bowel movement on 3/29. Will change regimen to prn and continue to monitor.    Pharmacy will continue to monitor and adjust per consult.   Dylan [name],Dylan [name] 11/29/2018 10:46 AM

## 2018-11-30 ENCOUNTER — Inpatient Hospital Stay: Payer: Medicare Other

## 2018-11-30 ENCOUNTER — Encounter: Payer: Self-pay | Admitting: Radiology

## 2018-11-30 LAB — GLUCOSE, CAPILLARY
Glucose-Capillary: 99 mg/dL (ref 70–99)
Glucose-Capillary: 180 mg/dL — ABNORMAL HIGH (ref 70–99)

## 2018-11-30 LAB — PHOSPHORUS: Phosphorus: 4.5 mg/dL (ref 2.5–4.6)

## 2018-11-30 LAB — BASIC METABOLIC PANEL
Anion gap: 11 (ref 5–15)
BUN: 45 mg/dL — ABNORMAL HIGH (ref 8–23)
CO2: 26 mmol/L (ref 22–32)
Calcium: 8.9 mg/dL (ref 8.9–10.3)
Chloride: 105 mmol/L (ref 98–111)
Creatinine, Ser: 1.26 mg/dL — ABNORMAL HIGH (ref 0.61–1.24)
GFR calc Af Amer: >60 mL/min (ref >60)
GFR calc non Af Amer: 53 mL/min — ABNORMAL LOW (ref >60)
Glucose, Bld: 131 mg/dL — ABNORMAL HIGH (ref 70–99)
Potassium: 3.7 mmol/L (ref 3.5–5.1)
Sodium: 142 mmol/L (ref 135–145)

## 2018-11-30 LAB — MAGNESIUM: Magnesium: 2.3 mg/dL (ref 1.7–2.4)

## 2018-11-30 MED ORDER — METHYLPREDNISOLONE SODIUM SUCC 125 MG IJ SOLR
60.0000 mg | Freq: Four times a day (QID) | INTRAMUSCULAR | Status: DC
  Administered 2018-11-30 – 2018-12-01 (×5): 60 mg via INTRAVENOUS
  Filled 2018-11-30 (×5): qty 2

## 2018-11-30 MED ORDER — IOHEXOL 350 MG/ML SOLN
75.0000 mL | Freq: Once | INTRAVENOUS | Status: AC | PRN
  Administered 2018-11-30: 75 mL via INTRAVENOUS

## 2018-11-30 MED ORDER — ENSURE ENLIVE PO LIQD
237.0000 mL | Freq: Two times a day (BID) | ORAL | Status: DC
  Administered 2018-11-30 – 2018-12-01 (×3): 237 mL via ORAL

## 2018-11-30 MED ORDER — FUROSEMIDE 40 MG PO TABS
40.0000 mg | ORAL_TABLET | Freq: Two times a day (BID) | ORAL | Status: DC
  Administered 2018-11-30 – 2018-12-01 (×2): 40 mg via ORAL
  Filled 2018-11-30 (×2): qty 1

## 2018-11-30 NOTE — Progress Notes (Signed)
OT Cancellation Note  Patient Details Name: Dylan [name] MRN: [id number] DOB: 1936/02/17   Cancelled Treatment:    Reason Eval/Treat Not Completed: Other (comment). Consult received, chart reviewed. Spoke with RN who cleared OT/PT to work with pt. Upon initial attempt SLP working with pt. Will re-attempt OT evaluation at later time as pt is available and medically appropriate.   Richrd Prime, MPH, MS, OTR/L ascom [phone] 11/30/18, 9:04 AM

## 2018-11-30 NOTE — [place] FL2 (Signed)
[place] MEDICA[place] FL2 LEVEL OF CARE SCREENING TOOL     IDENTIFICATION  Patient Name: Dylan [name] Birthdate: 06/29/1936 Sex: male Admission Date (Current Location): 11/23/2018  [place] and [place] Number:  Chiropodist and Address:  [facility], [address], [place], [place] [address]      Provider Number: [id number]  Attending Physician Name and Address:  Bertrum Sol, MD  Relative Name and Phone Number:  Leemon Shabani [phone]    Current Level of Care: Hospital Recommended Level of Care: Skilled Nursing Facility Prior Approval Number:    Date Approved/Denied:   PASRR Number: [id number]  Discharge Plan: SNF    Current Diagnoses: Patient Active Problem List   Diagnosis Date Noted  . Acute respiratory failure with hypoxemia (HCC) 11/23/2018  . Goals of care, counseling/discussion   . Palliative care by specialist   . DNR (do not resuscitate) discussion   . Acute CHF (congestive heart failure) (HCC) 11/14/2018  . Acute on chronic systolic CHF (congestive heart failure) (HCC) 10/27/2018  . HCAP (healthcare-associated pneumonia) 10/27/2018  . Hypokalemia 10/01/2018  . Hypertensive heart and kidney disease with acute on chronic diastolic congestive heart failure and stage 3 chronic kidney disease (HCC) 09/09/2018  . Chronic constipation 09/09/2018  . BPH with obstruction/lower urinary tract symptoms 09/09/2018  . Obstructive sleep apnea of adult 09/09/2018  . Idiopathic peripheral neuropathy 09/09/2018  . Chronic kidney disease, stage 3, mod decreased GFR (HCC) 09/09/2018  . Anemia of chronic renal failure, stage 3 (moderate) (HCC) 09/09/2018  . Periprosthetic fracture around internal prosthetic left knee joint 08/26/2018  . Acute ST elevation myocardial infarction (STEMI) of inferior wall (HCC) 06/21/2018  . S/P total knee arthroplasty 06/01/2018  . Primary osteoarthritis of left knee 02/05/2018  . Sepsis (HCC)  01/10/2018  . Community acquired pneumonia 12/01/2017  . Pneumonia 12/01/2017  . Hyperlipidemia 09/23/2017  . Weakness 08/31/2017  . Benign essential tremor 10/18/2016  . Class 3 severe obesity due to excess calories with serious comorbidity and body mass index (BMI) of 45.0 to 49.9 in adult (HCC) 10/18/2016  . Varicose veins of lower extremities with ulcer (HCC) 06/17/2016  . Chronic venous insufficiency 06/17/2016  . Lymphedema 06/17/2016  . Acute on chronic diastolic heart failure (HCC) 06/10/2016  . COPD, severe (HCC) 05/24/2016  . AV block, [place] 1 02/09/2016  . Coronary atherosclerosis of native coronary artery 02/09/2016  . ST elevation MI (STEMI) (HCC) 02/08/2016  . Right foot drop 04/07/2014  . Rotator cuff rupture, complete 04/22/2013  . Neck pain 04/14/2013    Orientation RESPIRATION BLADDER Height & Weight     Self, Place  Normal Incontinent, Indwelling catheter Weight: 127.3 kg Height:  6' (182.9 cm)  BEHAVIORAL SYMPTOMS/MOOD NEUROLOGICAL BOWEL NUTRITION STATUS      Incontinent Diet  AMBULATORY STATUS COMMUNICATION OF NEEDS Skin   Extensive Assist Verbally Skin abrasions, Bruising                       Personal Care Assistance Level of Assistance  Bathing, Dressing, Total care, Feeding Bathing Assistance: Maximum assistance Feeding assistance: Limited assistance Dressing Assistance: Maximum assistance Total Care Assistance: Maximum assistance   Functional Limitations Info  Sight, Hearing, Speech Sight Info: Adequate Hearing Info: Adequate Speech Info: Adequate    SPECIAL CARE FACTORS FREQUENCY  PT (By licensed PT), OT (By licensed OT)     PT Frequency: 5x per week OT Frequency: 5x per week  Contractures Contractures Info: Not present    Additional Factors Info  Code Status, Allergies, Isolation Precautions Code Status Info: Full code Allergies Info: NKDA     Isolation Precautions Info: ESBL in urine     Current Medications  (11/30/2018):  This is the current hospital active medication list Current Facility-Administered Medications  Medication Dose Route Frequency Provider Last Rate Last Dose  . acetaminophen (TYLENOL) tablet 650 mg  650 mg Oral Q6H PRN Arnaldo Natal, MD   650 mg at 11/29/18 2037   Or  . acetaminophen (TYLENOL) suppository 650 mg  650 mg Rectal Q6H PRN Arnaldo Natal, MD      . atorvastatin (LIPITOR) tablet 80 mg  80 mg Oral q1800 [name], Jetty Duhamel D, MD   80 mg at 11/29/18 1750  . budesonide (PULMICORT) nebulizer solution 0.5 mg  0.5 mg Nebulization BID [name], Montell D, MD   0.5 mg at 11/30/18 0750  . clonazePAM (KLONOPIN) tablet 0.5 mg  0.5 mg Oral BID PRN Angelina Ok D, MD   0.5 mg at 11/29/18 2037  . clopidogrel (PLAVIX) tablet 75 mg  75 mg Oral Daily [name], Montell D, MD   75 mg at 11/29/18 0853  . feeding supplement (ENSURE ENLIVE) (ENSURE ENLIVE) liquid 237 mL  237 mL Oral BID BM [name], Montell D, MD      . furosemide (LASIX) injection 40 mg  40 mg Intravenous BID [name], Montell D, MD   40 mg at 11/30/18 0830  . hydrALAZINE (APRESOLINE) injection 10 mg  10 mg Intravenous Q4H PRN [name], Montell D, MD   10 mg at 11/29/18 1518  . ipratropium-albuterol (DUONEB) 0.5-2.5 (3) MG/3ML nebulizer solution 3 mL  3 mL Nebulization QID [name], Montell D, MD   3 mL at 11/30/18 0750  . isosorbide mononitrate (IMDUR) 24 hr tablet 30 mg  30 mg Oral Daily Tukov-Yual, Magdalene S, NP   30 mg at 11/29/18 0853  . MEDLINE mouth rinse  15 mL Mouth Rinse Q4H Eugenie Norrie, NP   15 mL at 11/30/18 0834  . methylPREDNISolone sodium succinate (SOLU-MEDROL) 125 mg/2 mL injection 60 mg  60 mg Intravenous Q6H [name], Montell D, MD   60 mg at 11/30/18 0830  . morphine 2 MG/ML injection 2 mg  2 mg Intravenous Once Avaiya, Hitesh, MD      . multivitamin-lutein (OCUVITE-LUTEIN) capsule 1 capsule  1 capsule Oral Daily Erin Fulling, MD   1 capsule at 11/29/18 0851  . ondansetron (ZOFRAN) tablet 4 mg  4 mg Oral  Q6H PRN Arnaldo Natal, MD       Or  . ondansetron [facility]) injection 4 mg  4 mg Intravenous Q6H PRN Arnaldo Natal, MD      . pantoprazole (PROTONIX) EC tablet 40 mg  40 mg Oral Q24H Angelina Ok D, MD   40 mg at 11/29/18 2037  . QUEtiapine (SEROQUEL) tablet 50 mg  50 mg Oral QHS Bertram Savin, RPH   50 mg at 11/29/18 2036  . sacubitril-valsartan (ENTRESTO) 24-26 mg per tablet  1 tablet Oral BID Angelina Ok D, MD   1 tablet at 11/29/18 2036  . senna-docusate (Senokot-S) tablet 1 tablet  1 tablet Oral BID PRN Bertram Savin, RPH      . sodium chloride flush (NS) 0.9 % injection 3 mL  3 mL Intravenous Q12H [name], Montell D, MD   3 mL at 11/29/18 2038  . sodium chloride flush (NS) 0.9 % injection 3 mL  3 mL Intravenous PRN [name], Montell D, MD   3 mL at 11/29/18 1551  . spironolactone (ALDACTONE) tablet 12.5 mg  12.5 mg Oral Daily Tukov-Yual, Magdalene S, NP   12.5 mg at 11/29/18 0852  . vitamin C (ASCORBIC ACID) tablet 500 mg  500 mg Oral Daily [name], Montell D, MD   500 mg at 11/29/18 6073     Discharge Medications: Please see discharge summary for a list of discharge medications.  Relevant Imaging Results:  Relevant Lab Results:   Additional Information SSN: [id number]  Virgel Manifold, RN

## 2018-11-30 NOTE — Evaluation (Signed)
Occupational Therapy Evaluation Patient Details Name: Dylan [name] MRN: [id number] DOB: 11-18-1935 Today's Date: 11/30/2018    History of Present Illness Dylan [name] is an 83yo male who comes to [facility] on 3/23 c SOB, hypoxia, fever, coughing (covid-19 test negative), required intubation inititally, extubated to 4L on 3/25. PMH: CAD, MI, COPD, Rt Foot Drop, Ct TKA 2019, Lt femur fracture (Dec 2019) s/p ORIF/quads repair (no longer under precautions). Pt has not successfully AMB since Dec, previously wa sminimally AMB at home, has recently been total care with 24/7 caregiver assistance.    Clinical Impression   Pt seen for OT evaluation this date. Prior to hospital admission, pt was at home with [facility] services and 24/7 caregiver assist, nearly total assist for all ADL. Currently pt demonstrates impairments in activity tolerance, strength, ROM, coordination, balance, and cardiopulmonary status (please see additional detail below) requiring increased assist for ADL such as self feeding and grooming as well as bed mobility. With set up and positioning modifications for pt and meal tray, pt able to self feed <50% of lunch meal with intermittent min-mod assist from therapy after set up, with RUE tremors impeding pt's ability, particularly as pt fatigued and with holding a cup (with lid and straw) stable enough to bring to mouth without spilling. Pt would benefit from skilled OT to address noted impairments and functional limitations (see below for any additional details) in order to maximize safety and independence while minimizing falls risk and caregiver burden.  Upon hospital discharge, recommend pt discharge to [place].     Follow Up Recommendations  SNF    Equipment Recommendations  None recommended by OT    Recommendations for Other Services       Precautions / Restrictions Precautions Precautions: Fall Precaution Comments: Contact Isolation Precautions, watch O2 Other Brace: Per Dr. Rudene Christians pt does not  need to wear his brace any longer but may continue to use it for safety purposes if he can not support his weight without buckling Restrictions Weight Bearing Restrictions: Yes LLE Weight Bearing: Weight bearing as tolerated      Mobility Bed Mobility               General bed mobility comments: +2-3 for all aspects of bed mobility  Transfers                 General transfer comment: unsafe to attempt    Balance Overall balance assessment: Needs assistance Sitting-balance support: Single extremity supported   Sitting balance - Comments: long sitting in bed, pt requires lateral L side supports to improve upright posture, as pt leans laterally to the L Postural control: Left lateral lean     Standing balance comment: Unable/usafe to attempt                           ADL either performed or assessed with clinical judgement   ADL Overall ADL's : Needs assistance/impaired Eating/Feeding: Minimal assistance;Moderate assistance;Bed level Eating/Feeding Details (indicate cue type and reason): with HOB at >75 (pt could not tolerate 90 degr), and set up of tray and items to improve access, pt able to self feed with spoon <50% of lunch requiring min-mod assist occasionally, increasing towards end of meal when pt was fatiguing and to hold drinks with straws 2/2 tremors Grooming: Minimal assistance;Moderate assistance;Bed level   Upper Body Bathing: Maximal assistance;Bed level   Lower Body Bathing: Total assistance;Bed level   Upper Body Dressing : Bed  level;Total assistance   Lower Body Dressing: Bed level;Total assistance   Toilet Transfer: Total assistance Toilet Transfer Details (indicate cue type and reason): unsafe to attempt                 Vision Patient Visual Report: No change from baseline       Perception     Praxis      Pertinent Vitals/Pain Pain Assessment: No/denies pain     Hand Dominance Right   Extremity/Trunk Assessment  Upper Extremity Assessment Upper Extremity Assessment: Generalized weakness(decr bilat shoulder ROM, bilat resting tremors in hands)   Lower Extremity Assessment Lower Extremity Assessment: Generalized weakness(Lt knee P/ROM very limited at this time, likely 2/2 recent postoperative immobilization )   Cervical / Trunk Assessment Cervical / Trunk Assessment: Kyphotic   Communication Communication Communication: No difficulties   Cognition Arousal/Alertness: Awake/alert Behavior During Therapy: WFL for tasks assessed/performed Overall Cognitive Status: Within Functional Limits for tasks assessed                                 General Comments: alert and oriented (mildly confused about situation), follows simple commands   General Comments  Pt on RA at start of session, initially in mid 80's, HR 130, RN notified, placed on 1L O2 with O2 sats improving to >97% and HR generally in 60's-70's the remainder of the session.     Exercises Other Exercises Other Exercises: self feeding with positioning and set up modifications to improve access and independence   Shoulder Instructions      Home Living Family/patient expects to be discharged to:: Private residence Living Arrangements: Spouse/significant other Available Help at Discharge: Family;Neighbor;Personal care attendant;Other (Comment) Type of Home: House Home Access: Ramped entrance     Home Layout: One level               Home Equipment: [place] - 2 wheels;Cane - single point;Bedside commode;Shower seat;Grab bars - tub/shower;Wheelchair - Education administrator (comment);Hospital bed(hoyer lift)          Prior Functioning/Environment Level of Independence: Needs assistance  Gait / Transfers Assistance Needed: Per patient, he has not been able to stand up or ambulate since his L femur ORIF and quad repair December of 2019.  Pt requires a hoyer lift for transfers.  ADL's / Homemaking Assistance Needed: Total  assistance with ADLs from his PCA   Comments: Pt has an Rt AFO for foot drop         OT Problem List: Decreased strength;Decreased knowledge of use of DME or AE;Decreased range of motion;Decreased coordination;Decreased activity tolerance;Impaired UE functional use;Impaired balance (sitting and/or standing);Obesity      OT Treatment/Interventions: Self-care/ADL training;Balance training;Therapeutic exercise;Therapeutic activities;DME and/or AE instruction;Patient/family education;Energy conservation;Manual therapy    OT Goals(Current goals can be found in the care plan section) Acute Rehab OT Goals Patient Stated Goal: To improve strength and to be able to do more for himself OT Goal Formulation: With patient Time For Goal Achievement: 12/14/18 Potential to Achieve Goals: Fair ADL Goals Pt Will Perform Eating: with supervision;with set-up;with adaptive utensils;bed level(in long sitting w/ lateral supports, set up for meal, and supervision, pt will self feed >50% of meal.) Pt Will Perform Grooming: with set-up;with min assist;bed level Additional ADL Goal #1: Pt will utilize push button call bell independently to ensure pt's needs/wants are met.  OT Frequency: Min 1X/week   Barriers to D/C:  Co-evaluation              AM-PAC OT "6 Clicks" Daily Activity     Outcome Measure Help from another person eating meals?: A Lot Help from another person taking care of personal grooming?: A Lot Help from another person toileting, which includes using toliet, bedpan, or urinal?: Total Help from another person bathing (including washing, rinsing, drying)?: A Lot Help from another person to put on and taking off regular upper body clothing?: Total Help from another person to put on and taking off regular lower body clothing?: Total 6 Click Score: 9   End of Session Equipment Utilized During Treatment: Oxygen(1L O2) Nurse Communication: Other (comment)(O2 sats )  Activity  Tolerance: Patient tolerated treatment well Patient left: in bed;with call bell/phone within reach;with bed alarm set  OT Visit Diagnosis: Other abnormalities of gait and mobility (R26.89);Muscle weakness (generalized) (M62.81)                Time: 8127-5170 OT Time Calculation (min): 38 min Charges:  OT General Charges $OT Visit: 1 Visit OT Evaluation $OT Eval Moderate Complexity: 1 Mod OT Treatments $Self Care/Home Management : 8-22 mins  Jeni Salles, MPH, MS, OTR/L ascom [phone] 11/30/18, 2:29 PM

## 2018-11-30 NOTE — Progress Notes (Signed)
Pharmacy Monitoring Consult:  Pharmacy consulted to assist in monitoring and replacing electrolytes and constipation in this 83 y.o. male admitted on 11/23/2018 with respiratory distress. Patient extubated on 3/25.   Labs:  Sodium (mmol/L)  Date Value  11/30/2018 142  12/02/2012 142   Potassium (mmol/L)  Date Value  11/30/2018 3.7  12/02/2012 3.1 (L)   Magnesium (mg/dL)  Date Value  43/88/8757 2.3   Phosphorus (mg/dL)  Date Value  97/28/2060 4.5   Calcium (mg/dL)  Date Value  15/61/5379 8.9   Calcium, Total (mg/dL)  Date Value  43/27/6147 8.2 (L)   Albumin (g/dL)  Date Value  05/31/5746 2.6 (L)  12/02/2012 3.3 (L)    Assessment/Plan:  1. Electrolytes: No replacement warranted. Patient transferred to the floor. Patient remains on furosemide 40mg  PO BID and spironolactone 12.5mg  daily.   2. Constipation: Patient with documented bowel movement on 3/29. Will change regimen to prn and continue to monitor.    Because this consult was initiated in ICU, pharmacy will sign off for now  Lowella Bandy, PharmD 11/30/2018 7:08 AM

## 2018-11-30 NOTE — Progress Notes (Signed)
Pt now more alert, alert self and place, arouses to voice with ease and able to have conversation, pt with no complaints, no distress

## 2018-11-30 NOTE — TOC Progression Note (Signed)
Transition of Care [facility]) - Progression Note    Patient Details  Name: Dylan [name] MRN: [id number] Date of Birth: 1935/11/11  [facility] CM/SW Contact  Virgel Manifold, RN Phone Number: 11/30/2018, 11:01 AM  Clinical Narrative:  TOC spoke with spouse regarding transition of care. Spouse was originally agreeable to [facility] but the patient has progressed and does not qualify for LTACH anymore. Spouse does think SNF is appropriate and thinks it is the better option. FL2 done. Bed requests pending. Patient is active with [facility] if the disposition changes. Will provide bed offers to the spouse when appropriate.     Expected Discharge Plan: Skilled Nursing Facility Barriers to Discharge: Continued Medical Work up  Expected Discharge Plan and Services Expected Discharge Plan: Skilled Nursing Facility   Discharge Planning Services: CM Consult Post Acute Care Choice: Long Term Acute Care (LTAC) Living arrangements for the past 2 months: Single Family Home Expected Discharge Date: 11/28/18                         Social Determinants of Health (SDOH) Interventions    Readmission Risk Interventions Readmission Risk Prevention Plan 11/17/2018 11/17/2018 11/17/2018  Transportation Screening - - Complete  PCP or Specialist appointment within 3-5 days of discharge Complete Patient refused -  PCP/Specialist Appt Not Complete comments - wife is nurse; Dr. Arlana Pouch agrees to sign orders without seeing patient per Jasmine December at [place] office; wife agrees to make appt. -  HRI or Home Care Consult - (No Data) Complete  SW Recovery Care/Counseling Consult - - Complete  Palliative Care Screening - - Complete  Skilled Nursing Facility - - Not Complete  SNF Comments - - patient declined  Some recent data might be hidden

## 2018-11-30 NOTE — Progress Notes (Signed)
   11/30/18 1400  Clinical Encounter Type  Visited With Patient  Visit Type Follow-up  Spiritual Encounters  Spiritual Needs Emotional  Ch was rounding. Pt was alert and doing good. Pt said that he wants to see his children and also complained about the tremor in his arms. Ch offered a caring presence and a listening ear and let him know that chaplain service is available as needed.

## 2018-11-30 NOTE — Progress Notes (Addendum)
[employer] - [address] at [facility]   PATIENT NAME: Dylan [name]    MR#:  [id number]  DATE OF BIRTH:  04-05-1941  SUBJECTIVE:  CHIEF COMPLAINT:   Chief Complaint  Patient presents with   Respiratory Distress  Improved sensorium-still with minimal/mild lethargy noted, will discontinue Klonopin, status post left thoracentesis, plans for right thoracentesis on tomorrow REVIEW OF SYSTEMS:  Review of Systems  Unable to perform ROS: Intubated    DRUG ALLERGIES:  No Known Allergies VITALS:  Blood pressure (!) 149/96, pulse 71, temperature 97.6 F (36.4 C), resp. rate (!) 30, height 6' (1.829 m), weight 127.3 kg, SpO2 91 %. PHYSICAL EXAMINATION:  Physical Exam HENT:     Head: Normocephalic.  Eyes:     General: No scleral icterus.    Conjunctiva/sclera: Conjunctivae normal.  Neck:     Musculoskeletal: Neck supple.     Vascular: No JVD.     Trachea: No tracheal deviation.  Cardiovascular:     Rate and Rhythm: Normal rate and regular rhythm.     Heart sounds: Normal heart sounds. No murmur. No gallop.   Pulmonary:     Effort: No respiratory distress.     Breath sounds: Rhonchi and rales present. No wheezing.  Abdominal:     General: Bowel sounds are normal. There is no distension.     Palpations: Abdomen is soft.     Tenderness: There is no abdominal tenderness. There is no rebound.  Musculoskeletal:        General: No tenderness.  Skin:    Findings: No erythema or rash.  Neurological:     Comments: Unable to exam.    LABORATORY PANEL:  Male CBC Recent Labs  Lab 11/26/18 0526  WBC 7.0  HGB 10.0*  HCT 31.5*  PLT 186   ------------------------------------------------------------------------------------------------------------------ Chemistries  Recent Labs  Lab 11/30/18 0420  NA 142  K 3.7  CL 105  CO2 26  GLUCOSE 131*  BUN 45*  CREATININE 1.26*  CALCIUM 8.9  MG 2.3   RADIOLOGY:  Ct Head Wo Contrast  Result Date:  11/30/2018 CLINICAL DATA:  Fatigue and malaise EXAM: CT HEAD WITHOUT CONTRAST TECHNIQUE: Contiguous axial images were obtained from the base of the skull through the vertex without intravenous contrast. COMPARISON:  08/26/2018 FINDINGS: Brain: No evidence of acute infarction, hemorrhage, hydrocephalus, extra-axial collection or mass lesion/mass effect. Confluent low-density in the cerebral white matter. Remote small vessel infarcts in the bilateral deep gray nuclei. Generalized volume loss. Vascular: Atherosclerotic calcification.  No hyperdense vessel Skull: Negative Sinuses/Orbits: Negative IMPRESSION: 1. No acute finding or change from prior. 2. Atrophy and chronic small vessel ischemia. Electronically Signed   By: Marnee Spring M.D.   On: 11/30/2018 04:09   Ct Angio Chest Pe W Or Wo Contrast  Result Date: 11/30/2018 CLINICAL DATA:  Acute onset pulmonary edema EXAM: CT ANGIOGRAPHY CHEST WITH CONTRAST TECHNIQUE: Multidetector CT imaging of the chest was performed using the standard protocol during bolus administration of intravenous contrast. Multiplanar CT image reconstructions and MIPs were obtained to evaluate the vascular anatomy. CONTRAST:  64mL OMNIPAQUE IOHEXOL 350 MG/ML SOLN COMPARISON:  11/13/2018 FINDINGS: Cardiovascular: Cardiomegaly. No pericardial effusion. Extensive respiratory motion which limits visualization of pulmonary arteries beyond the lobar to proximal segmental level. There is mixing artifact in the left main and lobar pulmonary arteries without discrete filling defect. Poor flow within left lower lobe pulmonary arteries which is collapsed-attributed to shunting. Extensive coronary atherosclerotic calcification. Mediastinum/Nodes: Mildly prominent precarinal lymph node,  likely congestive in this setting. Lungs/Pleura: Tracheobronchomalacia with tracheal mucus. Moderate bilateral pleural effusion, larger on the left. Multi segment atelectasis. No Kerley lines or air bronchogram. Upper  Abdomen: No acute finding Musculoskeletal: Spondylosis with multi-level bridging osteophyte. No acute or aggressive finding. Gynecomastia. Review of the MIP images confirms the above findings. IMPRESSION: 1. Motion degraded study without evidence of acute pulmonary embolism. 2. Moderate right and moderate to large left pleural effusions with multi segment collapse. 3. Cardiomegaly. Electronically Signed   By: Marnee Spring M.D.   On: 11/30/2018 04:14   Dg Chest Port 1 View  Result Date: 11/29/2018 CLINICAL DATA:  Worsening shortness of breath. COPD. CHF. Ex-smoker. EXAM: PORTABLE CHEST 1 VIEW COMPARISON:  11/20/2018 FINDINGS: Patient rotated left. Cardiomegaly accentuated by AP portable technique. Atherosclerosis in the transverse aorta. Probable small layering bilateral pleural effusions. No pneumothorax. Interstitial edema is mild to moderate, increased. Left greater than right base airspace disease, similar on the left and increased on the right. IMPRESSION: Slightly worsened aeration, with increased congestive heart failure and right base atelectasis. Small bilateral pleural effusions and left base airspace disease remaining. Aortic Atherosclerosis (ICD10-I70.0). Electronically Signed   By: Jeronimo Greaves M.D.   On: 11/29/2018 15:23   US Thoracentesis Asp Pleural Space W/img Guide  Result Date: 11/30/2018 INDICATION: Left-sided pleural effusion EXAM: ULTRASOUND GUIDED LEFT THORACENTESIS MEDICATIONS: None. COMPLICATIONS: None immediate. PROCEDURE: An ultrasound guided thoracentesis was thoroughly discussed with the patient's wife and questions answered. The benefits, risks, alternatives and complications were also discussed. The patient understands and wishes to proceed with the procedure. Written consent was obtained. Ultrasound was performed to localize and mark an adequate pocket of fluid in the left chest. The area was then prepped and draped in the normal sterile fashion. 1% Lidocaine was used for  local anesthesia. Under ultrasound guidance a 6 Fr Safe-T-Centesis catheter was introduced. Thoracentesis was performed. The catheter was removed and a dressing applied. FINDINGS: A total of approximately 1.5 L of brownish fluid was removed. Samples were sent to the laboratory as requested by the clinical team. IMPRESSION: Successful ultrasound guided left thoracentesis yielding 1.5 L of pleural fluid. Electronically Signed   By: Alcide Clever M.D.   On: 11/30/2018 10:23   ASSESSMENT AND PLAN:  This is an 83 year old male admitted for acute respiratory failure  *Acute Respiratory failure with hypoxemia Resolving  secondary to pulmonary edema, plus or minus underlying inflammatory process, and bilateral pleural effusions Physical therapy recommending skilled nursing facility placement, wife is okay for nursing home placement-states that she cannot take care of patient at home  *Acute Lethargy Improved Most likely due to polypharmacy D/C Klonopin, continue Seroquel at bedtime    *Sepsis with high risk for novel coronavirus infection Resolved Treated on our sepsis protocol Treated with course of cefepime/vancomycin  Covid test negative  *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 Most recent EF 30 to 35% with grade 2 diastolic dysfunction Resolved Continue CHF protocol, spironolactone, Entresto, Coreg, lasix  *Acute bilateral pleural effusions, left greater than right Status post left thoracentesis with 1.5 L of fluid drained off November 30, 2018 For right thoracentesis on tomorrow  *Concern for coronavirus infection  Ruled out Test was negative   *Acute kidney injury  Resolved   *Chronic CAD Stable  Continue aspirin, Plavix and Lipitor  *Acute Elevated troponin Secondary to demand ischemia/sepsis  Disposition to skilled nursing facility on tomorrow barring any complications after thoracentesis  All the records are reviewed and case  discussed  with Care Management/Social Worker. Management plans discussed with the patient, family and they are in agreement.  CODE STATUS: Full Code  TOTAL TIME TAKING CARE OF THIS PATIENT: 30 minutes.   More than 50% of the time was spent in counseling/coordination of care: YES  POSSIBLE D/C IN 1-3 DAYS, DEPENDING ON CLINICAL CONDITION.   [name] Reannah Totten M.D on 11/30/2018 at 11:03 AM  Between 7am to 6pm - Pager - [phone]  After 6pm go to [email or web address] - Therapist, nutritional Hospitalists

## 2018-11-30 NOTE — Progress Notes (Signed)
Dr Katheren Shams made aware of patients respiratory count. MD acknowledged, no new orders.

## 2018-11-30 NOTE — Progress Notes (Signed)
Dr Maryruth Hancock made aware of the chest CT result, no new orders

## 2018-11-30 NOTE — Care Management Important Message (Signed)
Important Message  Patient Details  Name: Dylan [name] MRN: [id number] Date of Birth: Nov 07, 1935   Medicare Important Message Given:  Yes(reviwed with wife over the phone)    Virgel Manifold, RN 11/30/2018, 2:49 PM

## 2018-11-30 NOTE — Progress Notes (Signed)
Speech Language Pathology Treatment: Dysphagia  Patient Details Name: Dylan [name] MRN: [id number] DOB: 1936/01/06 Today's Date: 11/30/2018 Time: 0825-0915 SLP Time Calculation (min) (ACUTE ONLY): 50 min  Assessment / Plan / Recommendation Clinical Impression  Pt seen for ongoing assessment of toleration of oral diet; diet consistency upgraded over the weekend. NSG denied any difficulty swallowing but stated pt has been lethargic/sleepy d/t some confusion during the night; medications given for anxiety. Pt is d/t have a thoracentesis today.  Pt is currently on a Regular diet consistency. He required full assistance w/ sitting upright in bed in a more midline position - quite weak. Noted BASELINE UE tremors which impacted his abilty to self-feed so assistance was given. He consumed po trials of thin liquids via straw/cup, purees, and mech soft solids w/ no overt s/s of aspiration noted; no decline in vocal quality or respiratory status during/post po trials during breakfast meal. Min increased oral phase time required for full mastication prep of solids for swallowing. Foods were cut and moistened to aid mastication. Oral clearing achieved post swallowing. Pt required feeding assistance d/t UE tremors(baseline). He liked to use a straw when drinking d/t this as well.  Recommend continue current upgraded diet(meats cut and foods cooked/soft for easier mastication/eating) w/ aspiration precautions; feeding support d/t tremors; pills in puree(Whole) for safer swallowing.  Pt appears at his baseline w/ regard to swallowing. Education given on aspiration precautions; food consistencies and preparation; need for feeding support d/t UE tremors baseline. ST services will sign off but can be available if needed for education while admitted.  NSG updated, agreed.     HPI HPI: Patient is a 83 y/o male with past medical history significant for CHF, COPD, MI, sleep apnea, OA, essential tremor, CAD, hypertension  and recent discharge from the hospital on 11/19/2018 for acute on chronic combined heart failure exacerbation and bacteremia presents to the emergency department via EMS due to shortness of breath.  The patient was in respiratory distress and was placed on nonrebreather.  Work of breathing and oxygenation required intubation upon arrival.  The patient's son reports that his father had been febrile to 14 F yesterday and has been coughing.  Chest x-ray shows pulmonary edema as well as possible bilateral basilar opacities.  Sepsis protocol was initiated as well as COVID-19 testing.  The patient also developed hypotension which prompted initiation of pressor support prior to the emergency department staff calling the hospitalist service for admission. Pt was extubated 11/25/2018 and is tolerating well per NSG report. Diet was upgraded per MD order over the weekend.      SLP Plan  All goals met       Recommendations  Diet recommendations: Regular;Thin liquid(meats cut; less hard/raw foods) Liquids provided via: Cup;Straw Medication Administration: Whole meds with puree(for safer swallowing) Supervision: Patient able to self feed;Staff to assist with self feeding;Intermittent supervision to cue for compensatory strategies(UE tremors) Compensations: Minimize environmental distractions;Slow rate;Small sips/bites;Follow solids with liquid Postural Changes and/or Swallow Maneuvers: Seated upright 90 degrees;Upright 30-60 min after meal                General recommendations: (Dietician f/u - drinks Boost baseline) Oral Care Recommendations: Oral care BID;Staff/trained caregiver to provide oral care Follow up Recommendations: None SLP Visit Diagnosis: Dysphagia, unspecified (R13.10) Plan: All goals met       GO                Dylan Kenner, MS, CCC-SLP [name],Dylan 11/30/2018, 2:09 PM

## 2018-11-30 NOTE — Progress Notes (Signed)
Pt noted yelling out at the first of shift at times PRN klonopin given for anxiety, had a BM, arousing to voice, as shift progresses and after night time meds pt lethargic, arouses to voice and touch, appears to be in no distress, congested cough at times remains, VSS except for RR still 30s but has been increased throughout this admission this is not new, previous RN spoke with MD regarding RR and orders were placed (see RNs 1626 note), RT also aware of RR, will continue to monitor

## 2018-11-30 NOTE — Progress Notes (Signed)
Pt even more lethargic now, only moaning with sternal rub, MD made aware, new order for head CT and CT angio, VSS remain stable except for RR still in 30s, HR 50s

## 2018-11-30 NOTE — Procedures (Signed)
US thoracentesis on left  Without difficulty  Complications:  None  Blood Loss: none  See dictation in canopy pacs

## 2018-11-30 NOTE — TOC Progression Note (Signed)
Transition of Care [facility]) - Progression Note    Patient Details  Name: PRINCEMICHAEL GRAVIER MRN: [id number] Date of Birth: Jul 07, 1936  Transition of Care [facility]) CM/SW Contact  Ruthe Mannan, [place] Phone Number: 11/30/2018, 3:44 PM  Clinical Narrative:   CSW received phone call from patient's wife Broughton Silos [phone]. Wife states that patient needs to go to rehab and wanted to know what facilities are available. CSW presented only current bed offer [facility]). Wife asked about CIR and LTAC. CSW explained that patient is not at those levels and currently needs to be in SNF. Wife states that she would like to wait and see if any other facilities make an offer. CSW explained that a decision would have to be made tomorrow morning because patient will be ready for discharge soon. Wife is in agreement. CSW will follow up with patient tomorrow morning regarding discharge disposition.     Expected Discharge Plan: Skilled Nursing Facility Barriers to Discharge: Continued Medical Work up  Expected Discharge Plan and Services Expected Discharge Plan: Skilled Nursing Facility   Discharge Planning Services: CM Consult Post Acute Care Choice: Long Term Acute Care (LTAC) Living arrangements for the past 2 months: Single Family Home Expected Discharge Date: 11/28/18                         Social Determinants of Health (SDOH) Interventions    Readmission Risk Interventions Readmission Risk Prevention Plan 11/17/2018 11/17/2018 11/17/2018  Transportation Screening - - Complete  PCP or Specialist appointment within 3-5 days of discharge Complete Patient refused -  PCP/Specialist Appt Not Complete comments - wife is nurse; Dr. Arlana Pouch agrees to sign orders without seeing patient per Jasmine December at [place] office; wife agrees to make appt. -  HRI or Home Care Consult - (No Data) Complete  SW Recovery Care/Counseling Consult - - Complete  Palliative Care Screening - - Complete  Skilled Nursing  Facility - - Not Complete  SNF Comments - - patient declined  Some recent data might be hidden

## 2018-11-30 NOTE — Progress Notes (Addendum)
Dr Arville Care made aware that tele monitoring reports that pt had a 2.3 sec pause, HR 50s, pt RR remains in the 30s, and pt lethargic since night time meds, arouses to voice and sternal rub, Dr Arville Care aware of previous shift RN contacts with MD and orders that were placed then (ABG, chest xray etc), aware of thoracentesis tomorrow, discontinued coreg and is to place a cardiology consult order, states that he will make Dr Cassie Freer aware

## 2018-12-01 ENCOUNTER — Inpatient Hospital Stay: Payer: Medicare Other

## 2018-12-01 LAB — GLUCOSE, CAPILLARY
Glucose-Capillary: 160 mg/dL — ABNORMAL HIGH (ref 70–99)
Glucose-Capillary: 188 mg/dL — ABNORMAL HIGH (ref 70–99)
Glucose-Capillary: 195 mg/dL — ABNORMAL HIGH (ref 70–99)

## 2018-12-01 MED ORDER — BUDESONIDE 0.5 MG/2ML IN SUSP: 0.5000 mg | Freq: Two times a day (BID) | RESPIRATORY_TRACT | 12 refills | Status: DC

## 2018-12-01 MED ORDER — QUETIAPINE FUMARATE 50 MG PO TABS: 50.0000 mg | ORAL_TABLET | Freq: Every day | ORAL | 0 refills | Status: DC

## 2018-12-01 MED ORDER — ENSURE ENLIVE PO LIQD: 237.0000 mL | Freq: Two times a day (BID) | ORAL | 0 refills | Status: DC

## 2018-12-01 MED ORDER — PANTOPRAZOLE SODIUM 40 MG PO TBEC: 40.0000 mg | DELAYED_RELEASE_TABLET | ORAL | 0 refills | Status: AC

## 2018-12-01 MED ORDER — PREDNISONE 50 MG PO TABS: ORAL_TABLET | ORAL | 0 refills | Status: DC

## 2018-12-01 NOTE — TOC Transition Note (Signed)
Transition of Care [facility]) - CM/SW Discharge Note   Patient Details  Name: Dylan [name] MRN: [id number] Date of Birth: 1935-12-01  Transition of Care [facility]) CM/SW Contact:  Ruthe Mannan, LCSWA Phone Number: 12/01/2018, 11:52 AM   Clinical Narrative:  Patient is medically ready for discharge today. CSW spoke with patient's wife Dylan [name] who is in agreement with discharge today to [facility]. CSW also notified Tresa Endo at [facility] of discharge today. Patient will be transported by EMS. RN to call report and call for transport.       Final next level of care: Skilled Nursing Facility Barriers to Discharge: No Barriers Identified   Patient Goals and CMS Choice Patient states their goals for this hospitalization and ongoing recovery are:: I need him to go to rehab  CMS Medicare.gov Compare Post Acute Care list provided to:: Patient Represenative (must comment)(Wife- Dylan [name]) Choice offered to / list presented to : Spouse  Discharge Placement   Existing PASRR number confirmed : 11/30/18          Patient chooses bed at: [facility] Patient to be transferred to facility by: EMS Name of family member notified: Dylan [name]- wife  Patient and family notified of of transfer: 12/01/18  Discharge Plan and Services   Discharge Planning Services: CM Consult Post Acute Care Choice: Long Term Acute Care (LTAC)                    Social Determinants of Health (SDOH) Interventions     Readmission Risk Interventions Readmission Risk Prevention Plan 11/17/2018 11/17/2018 11/17/2018  Transportation Screening - - Complete  PCP or Specialist appointment within 3-5 days of discharge Complete Patient refused -  PCP/Specialist Appt Not Complete comments - wife is nurse; Dr. Arlana Pouch agrees to sign orders without seeing patient per Jasmine December at [place] office; wife agrees to make appt. -  HRI or Home Care Consult - (No Data) Complete  SW Recovery Care/Counseling  Consult - - Complete  Palliative Care Screening - - Complete  Skilled Nursing Facility - - Not Complete  SNF Comments - - patient declined  Some recent data might be hidden

## 2018-12-01 NOTE — TOC Progression Note (Signed)
Transition of Care [facility]) - Progression Note    Patient Details  Name: Dylan [name] MRN: [id number] Date of Birth: [birth year]  Transition [facility] Care [facility]) CM/SW Contact  Ruthe Mannan, [place] Phone Number: 12/01/2018, 8:50 AM  Clinical Narrative:  CSW spoke with patient's wife [name] regarding SNF placement. Wife is in agreement with discharge to [facility]. CSW explained that patient could potentially be ready for discharge today. Wife is in agreement. CSW will continue to follow for discharge planning.     Expected Discharge Plan: Skilled Nursing Facility Barriers to Discharge: Continued Medical Work up  Expected Discharge Plan and Services Expected Discharge Plan: Skilled Nursing Facility   Discharge Planning Services: CM Consult Post Acute Care Choice: Long Term Acute Care (LTAC) Living arrangements for the past 2 months: Single Family Home Expected Discharge Date: 11/28/18                         Social Determinants of Health (SDOH) Interventions    Readmission Risk Interventions Readmission Risk Prevention Plan 11/17/2018 11/17/2018 11/17/2018  Transportation Screening - - Complete  PCP or Specialist appointment within 3-5 days of discharge Complete Patient refused -  PCP/Specialist Appt Not Complete comments - wife is nurse; Dr. Arlana Pouch agrees to sign orders without seeing patient per Jasmine December at [place] office; wife agrees to make appt. -  HRI or Home Care Consult - (No Data) Complete  SW Recovery Care/Counseling Consult - - Complete  Palliative Care Screening - - Complete  Skilled Nursing Facility - - Not Complete  SNF Comments - - patient declined  Some recent data might be hidden

## 2018-12-01 NOTE — Progress Notes (Signed)
Patient transported to [facility] via EMS.

## 2018-12-01 NOTE — Progress Notes (Signed)
OT Cancellation Note  Patient Details Name: THUAN SHELDRICK MRN: [id number] DOB: 11-19-1935   Cancelled Treatment:    Reason Eval/Treat Not Completed: Patient at procedure or test/ unavailable. Pt out of room for thoracentesis this morning. Will re-attempt OT tx at later date/time as pt is appropriate and as schedule permits.   Richrd Prime, MPH, MS, OTR/L ascom [phone] 12/01/18, 8:57 AM

## 2018-12-01 NOTE — Discharge Summary (Signed)
[facility] [place] at [facility]   PATIENT NAME: Dylan [name]    MR#:  [id number]  DATE OF BIRTH:  22-Oct-1935  DATE OF ADMISSION:  11/23/2018 ADMITTING PHYSICIAN: Arnaldo NatalMichael S Diamond, MD  DATE OF DISCHARGE: No discharge date for patient encounter.  PRIMARY CARE PHYSICIAN: Jaclyn Shaggyate, [name], MD    ADMISSION DIAGNOSIS:  Hypoxia [R09.02] Elevated troponin [R79.89] Acute respiratory failure with hypoxia (HCC) [J96.01] Acute respiratory failure with hypoxemia (HCC) [J96.01]  DISCHARGE DIAGNOSIS:  Active Problems:   Acute respiratory failure with hypoxemia (HCC)   SECONDARY DIAGNOSIS:   Past Medical History:  Diagnosis Date  . CAD (coronary artery disease)   . Cervicalgia   . CHF (congestive heart failure) (HCC)   . COPD (chronic obstructive pulmonary disease) (HCC)   . Diastolic heart failure (HCC)   . Foot drop, right   . History of kidney stones   . Hyperlipidemia    unspecified  . Hypertension   . Myocardial infarction (HCC)   . Osteoarthritis   . Shoulder pain, left   . Sleep apnea   . Tremor, essential     HOSPITAL COURSE:  This is an 83 year old male admitted for acute respiratory failure  *AcuteRespiratory failure with hypoxemia Resolvd secondary to pulmonary edema, plus or minus underlying inflammatory process, and bilateral pleural effusions Physical therapy recommended SNF, wife is okay for nursing home placement-states that she cannot take care of patient at home  *Acute Lethargy Resolved Most likely due to polypharmacy D/C Klonopin/neurontin, continued Seroquel at bedtime   *Sepsis with high risk for novel coronavirus infection Resolved Treated on our sepsis protocol Treated with course of cefepime/vancomycin  Covid test negative  *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 Most recent EF 30 to 35% with grade 2 diastolic dysfunction Resolved Treated on our CHF protocol, spironolactone,  Entresto, Coreg, lasix  *Acute bilateral pleural effusions, left greater than right Status post left thoracentesis with 1.5 L of fluid drained off November 30, 2018 Attempted right thoracentesis but not done due to no good window for drainage  *Concern for coronavirus infection  Ruled out Test was negative   *Acute kidney injury  Resolved   *Chronic CAD Stable  Continued aspirin, Plavix and Lipitor  *AcuteElevated troponin Stable Secondary to demand ischemia/sepsis  DISCHARGE CONDITIONS:   stable  CONSULTS OBTAINED:  Treatment Team:  Alwyn Peaallwood, Dwayne D, MD  DRUG ALLERGIES:  No Known Allergies  DISCHARGE MEDICATIONS:   Allergies as of 12/01/2018   No Known Allergies     Medication List    STOP taking these medications   gabapentin 100 MG capsule Commonly known as:  NEURONTIN   LORazepam 0.5 MG tablet Commonly known as:  ATIVAN   Melatonin 10 MG Tabs     TAKE these medications   acetaminophen 500 MG tablet Commonly known as:  TYLENOL Take 1,000 mg by mouth 2 (two) times daily as needed for moderate pain.   alum & mag hydroxide-simeth 400-400-40 MG/5ML suspension Commonly known as:  MAALOX PLUS Take 30 mLs by mouth every 4 (four) hours as needed for indigestion.   aspirin EC 81 MG tablet Take 81 mg by mouth daily.   atorvastatin 80 MG tablet Commonly known as:  LIPITOR Take 80 mg by mouth daily.   benzonatate 100 MG capsule Commonly known as:  Tessalon Perles Take 2 capsules (200 mg total) by mouth 3 (three) times daily as needed for cough.   budesonide 0.5 MG/2ML nebulizer solution Commonly  known as:  PULMICORT Take 2 mLs (0.5 mg total) by nebulization 2 (two) times daily.   carvedilol 6.25 MG tablet Commonly known as:  COREG Take 6.25 mg by mouth 2 (two) times daily with a meal.   CENTROVITE Tabs Take 1 tablet by mouth daily.   Cholecalciferol 100 MCG (4000 UT) Tabs Take 1 tablet by mouth daily.   clopidogrel 75 MG tablet Commonly  known as:  PLAVIX Take 75 mg by mouth daily.   dextromethorphan 30 MG/5ML liquid Commonly known as:  DELSYM Take 5 mLs (30 mg total) by mouth 2 (two) times daily as needed for cough.   Entresto 24-26 MG Generic drug:  sacubitril-valsartan Take 1 tablet by mouth 2 (two) times daily.   feeding supplement (ENSURE ENLIVE) Liqd Take 237 mLs by mouth 2 (two) times daily between meals.   furosemide 40 MG tablet Commonly known as:  LASIX Take 1 tablet (40 mg total) by mouth 2 (two) times daily for 30 days.   ipratropium-albuterol 0.5-2.5 (3) MG/3ML Soln Commonly known as:  DUONEB Take 3 mLs by nebulization every 6 (six) hours as needed (shortness of breath).   isosorbide mononitrate 30 MG 24 hr tablet Commonly known as:  IMDUR Take 30 mg by mouth daily.   ketotifen 0.025 % ophthalmic solution Commonly known as:  ZADITOR Place 1 drop into both eyes 2 (two) times daily.   loratadine 10 MG tablet Commonly known as:  CLARITIN Take 10 mg by mouth daily.   magnesium hydroxide 400 MG/5ML suspension Commonly known as:  MILK OF MAGNESIA Take 30 mLs by mouth daily as needed for mild constipation.   nitroGLYCERIN 0.3 MG SL tablet Commonly known as:  NITROSTAT Place 0.3 mg under the tongue every 5 (five) minutes as needed for chest pain.   pantoprazole 40 MG tablet Commonly known as:  PROTONIX Take 1 tablet (40 mg total) by mouth daily.   polyethylene glycol packet Commonly known as:  MIRALAX / GLYCOLAX Take 17 g by mouth 2 (two) times daily as needed. Hold for loose stools.   potassium chloride SA 20 MEQ tablet Commonly known as:  K-DUR,KLOR-CON Take 20 mEq by mouth 2 (two) times daily.   predniSONE 50 MG tablet Commonly known as:  DELTASONE 1 po daily   primidone 50 MG tablet Commonly known as:  MYSOLINE Take 200 mg by mouth 2 (two) times daily.   QUEtiapine 50 MG tablet Commonly known as:  SEROQUEL Take 1 tablet (50 mg total) by mouth at bedtime.   sennosides-docusate  sodium 8.6-50 MG tablet Commonly known as:  SENOKOT-S Take 2 tablets by mouth 2 (two) times daily as needed for constipation.   spironolactone 25 MG tablet Commonly known as:  ALDACTONE Take 12.5 mg by mouth daily.   tamsulosin 0.4 MG Caps capsule Commonly known as:  FLOMAX Take 1 capsule (0.4 mg total) by mouth daily.   umeclidinium-vilanterol 62.5-25 MCG/INH Aepb Commonly known as:  ANORO ELLIPTA Inhale 1 puff into the lungs daily.   VITAMIN C PO Take 500 mg by mouth daily.        DISCHARGE INSTRUCTIONS:    If you experience worsening of your admission symptoms, develop shortness of breath, life threatening emergency, suicidal or homicidal thoughts you must seek medical attention immediately by calling 911 or calling your MD immediately  if symptoms less severe.  You Must read complete instructions/literature along with all the possible adverse reactions/side effects for all the Medicines you take and that have been prescribed  to you. Take any new Medicines after you have completely understood and accept all the possible adverse reactions/side effects.   Please note  You were cared for by a hospitalist during your hospital stay. If you have any questions about your discharge medications or the care you received while you were in the hospital after you are discharged, you can call the unit and asked to speak with the hospitalist on call if the hospitalist that took care of you is not available. Once you are discharged, your primary care physician will handle any further medical issues. Please note that NO REFILLS for any discharge medications will be authorized once you are discharged, as it is imperative that you return to your primary care physician (or establish a relationship with a primary care physician if you do not have one) for your aftercare needs so that they can reassess your need for medications and monitor your lab values.    Today   CHIEF COMPLAINT:   Chief  Complaint  Patient presents with  . Respiratory Distress    HISTORY OF PRESENT ILLNESS:  The patient with past medical history significant for CAD, hypertension and recent discharge from the hospital on 11/19/2018 for acute on chronic combined heart failure exacerbation and bacteremia presents to the emergency department via EMS due to shortness of breath.  The patient was in respiratory distress and was placed on nonrebreather.  Work of breathing and oxygenation required intubation upon arrival.  The patient's son reports that his father had been febrile to 34 F yesterday and has been coughing.  Chest x-ray shows pulmonary edema as well as possible bilateral basilar opacities.  Sepsis protocol was initiated as well as COVID-19 testing.  The patient also developed hypotension which prompted initiation of pressor support prior to the emergency department staff calling the hospitalist service for admission.  VITAL SIGNS:  Blood pressure (!) 146/69, pulse (!) 54, temperature 97.8 F (36.6 C), temperature source Oral, resp. rate 17, height 6' (1.829 m), weight 127.3 kg, SpO2 96 %.  I/O:    Intake/Output Summary (Last 24 hours) at 12/01/2018 1059 Last data filed at 12/01/2018 0607 Gross per 24 hour  Intake -  Output 5400 ml  Net -5400 ml    PHYSICAL EXAMINATION:  GENERAL:  83 y.o.-year-old patient lying in the bed with no acute distress.  EYES: Pupils equal, round, reactive to light and accommodation. No scleral icterus. Extraocular muscles intact.  HEENT: Head atraumatic, normocephalic. Oropharynx and nasopharynx clear.  NECK:  Supple, no jugular venous distention. No thyroid enlargement, no tenderness.  LUNGS: Normal breath sounds bilaterally, no wheezing, rales,rhonchi or crepitation. No use of accessory muscles of respiration.  CARDIOVASCULAR: S1, S2 normal. No murmurs, rubs, or gallops.  ABDOMEN: Soft, non-tender, non-distended. Bowel sounds present. No organomegaly or mass.   EXTREMITIES: No pedal edema, cyanosis, or clubbing.  NEUROLOGIC: Cranial nerves II through XII are intact. Muscle strength 5/5 in all extremities. Sensation intact. Gait not checked.  PSYCHIATRIC: The patient is alert and oriented x 3.  SKIN: No obvious rash, lesion, or ulcer.   DATA REVIEW:   CBC Recent Labs  Lab 11/26/18 0526  WBC 7.0  HGB 10.0*  HCT 31.5*  PLT 186    Chemistries  Recent Labs  Lab 11/30/18 0420  NA 142  K 3.7  CL 105  CO2 26  GLUCOSE 131*  BUN 45*  CREATININE 1.26*  CALCIUM 8.9  MG 2.3    Cardiac Enzymes No results for input(s): TROPONINI in  the last 168 hours.  Microbiology Results  Results for orders placed or performed during the hospital encounter of 11/23/18  Blood Culture (routine x 2)     Status: None   Collection Time: 11/23/18  3:20 AM  Result Value Ref Range Status   Specimen Description BLOOD RIGHT FOREARM  Final   Special Requests   Final    BOTTLES DRAWN AEROBIC AND ANAEROBIC Blood Culture adequate volume   Culture   Final    NO GROWTH 5 DAYS Performed at [facility], [address] [place], [place] [address]    Report Status 11/28/2018 FINAL  Final  Blood Culture (routine x 2)     Status: None   Collection Time: 11/23/18  3:20 AM  Result Value Ref Range Status   Specimen Description BLOOD LEFT ARM  Final   Special Requests   Final    BOTTLES DRAWN AEROBIC AND ANAEROBIC Blood Culture adequate volume   Culture   Final    NO GROWTH 5 DAYS Performed at [facility], [address], [place], [place] [address]    Report Status 11/28/2018 FINAL  Final  Urine culture     Status: None   Collection Time: 11/23/18  3:39 AM  Result Value Ref Range Status   Specimen Description   Final    URINE, RANDOM Performed at [facility], [address], [place], [place] [address]    Special Requests   Final    NONE Performed at [facility], [address] [place], [place] [address]     Culture   Final    NO GROWTH Performed at [facility], [address], [place], [place] [address]    Report Status 11/24/2018 FINAL  Final  Novel Coronavirus, NAA (hospital order; send-out to ref lab)     Status: None   Collection Time: 11/23/18  3:39 AM  Result Value Ref Range Status   SARS-CoV-2, NAA NOT DETECTED NOT DETECTED Final    Comment: (NOTE) Testing was performed using the cobas(R) SARS-CoV-2 test. This test was developed and its performance characteristics determined by [employer]. This test has not been FDA cleared or approved. This test has been authorized by FDA under an Emergency Use Authorization (EUA). This test is only authorized for the duration of time the declaration that circumstances exist justifying the authorization of the emergency use of in vitro diagnostic tests for detection of SARS-CoV-2 virus and/or diagnosis of COVID-19 infection under section 564(b)(1) of the Act, 21 U.S.C. 130QMV-7(Q)(4), unless the authorization is terminated or revoked sooner. Performed At: [facility] [address] [place], [place] [id number] Jolene Schimke MD XL:[phone]    Coronavirus Source NASAL SWAB  Final    Comment: Performed at [facility], [address] [place], [place] [address]  MRSA PCR Screening     Status: None   Collection Time: 11/23/18  6:26 AM  Result Value Ref Range Status   MRSA by PCR NEGATIVE NEGATIVE Final    Comment:        The GeneXpert MRSA Assay (FDA approved for NASAL specimens only), is one component of a comprehensive MRSA colonization surveillance program. It is not intended to diagnose MRSA infection nor to guide or monitor treatment for MRSA infections. Performed at [facility], [address] [place], [place] [address]   Respiratory Panel by PCR     Status: None   Collection Time: 11/23/18 11:21 AM  Result Value Ref Range Status  Adenovirus NOT DETECTED NOT DETECTED Final    Coronavirus 229E NOT DETECTED NOT DETECTED Final    Comment: (NOTE) The Coronavirus on the Respiratory Panel, DOES NOT test for the novel  Coronavirus (2019 nCoV)    Coronavirus HKU1 NOT DETECTED NOT DETECTED Final   Coronavirus NL63 NOT DETECTED NOT DETECTED Final   Coronavirus OC43 NOT DETECTED NOT DETECTED Final   Metapneumovirus NOT DETECTED NOT DETECTED Final   Rhinovirus / Enterovirus NOT DETECTED NOT DETECTED Final   Influenza A NOT DETECTED NOT DETECTED Final   Influenza B NOT DETECTED NOT DETECTED Final   Parainfluenza Virus 1 NOT DETECTED NOT DETECTED Final   Parainfluenza Virus 2 NOT DETECTED NOT DETECTED Final   Parainfluenza Virus 3 NOT DETECTED NOT DETECTED Final   Parainfluenza Virus 4 NOT DETECTED NOT DETECTED Final   Respiratory Syncytial Virus NOT DETECTED NOT DETECTED Final   Bordetella pertussis NOT DETECTED NOT DETECTED Final   Chlamydophila pneumoniae NOT DETECTED NOT DETECTED Final   Mycoplasma pneumoniae NOT DETECTED NOT DETECTED Final    Comment: Performed at [facility], [address], [place], [place] [address]    RADIOLOGY:  Dg Chest 1 View  Result Date: 11/30/2018 CLINICAL DATA:  Status post left thoracentesis EXAM: CHEST  1 VIEW COMPARISON:  11/29/2018 FINDINGS: Cardiac shadow is enlarged. Aortic calcifications are seen. Decreased left pleural effusion is noted following thoracentesis. No pneumothorax is noted. Tiny right pleural effusion remains. Mild central vascular congestion is noted as well. IMPRESSION: No pneumothorax following thoracentesis. Electronically Signed   By: Alcide Clever M.D.   On: 11/30/2018 11:06   Ct Head Wo Contrast  Result Date: 11/30/2018 CLINICAL DATA:  Fatigue and malaise EXAM: CT HEAD WITHOUT CONTRAST TECHNIQUE: Contiguous axial images were obtained from the base of the skull through the vertex without intravenous contrast. COMPARISON:  08/26/2018 FINDINGS: Brain: No evidence of acute infarction, hemorrhage,  hydrocephalus, extra-axial collection or mass lesion/mass effect. Confluent low-density in the cerebral white matter. Remote small vessel infarcts in the bilateral deep gray nuclei. Generalized volume loss. Vascular: Atherosclerotic calcification.  No hyperdense vessel Skull: Negative Sinuses/Orbits: Negative IMPRESSION: 1. No acute finding or change from prior. 2. Atrophy and chronic small vessel ischemia. Electronically Signed   By: Marnee Spring M.D.   On: 11/30/2018 04:09   Ct Angio Chest Pe W Or Wo Contrast  Result Date: 11/30/2018 CLINICAL DATA:  Acute onset pulmonary edema EXAM: CT ANGIOGRAPHY CHEST WITH CONTRAST TECHNIQUE: Multidetector CT imaging of the chest was performed using the standard protocol during bolus administration of intravenous contrast. Multiplanar CT image reconstructions and MIPs were obtained to evaluate the vascular anatomy. CONTRAST:  2mL OMNIPAQUE IOHEXOL 350 MG/ML SOLN COMPARISON:  11/13/2018 FINDINGS: Cardiovascular: Cardiomegaly. No pericardial effusion. Extensive respiratory motion which limits visualization of pulmonary arteries beyond the lobar to proximal segmental level. There is mixing artifact in the left main and lobar pulmonary arteries without discrete filling defect. Poor flow within left lower lobe pulmonary arteries which is collapsed-attributed to shunting. Extensive coronary atherosclerotic calcification. Mediastinum/Nodes: Mildly prominent precarinal lymph node, likely congestive in this setting. Lungs/Pleura: Tracheobronchomalacia with tracheal mucus. Moderate bilateral pleural effusion, larger on the left. Multi segment atelectasis. No Kerley lines or air bronchogram. Upper Abdomen: No acute finding Musculoskeletal: Spondylosis with multi-level bridging osteophyte. No acute or aggressive finding. Gynecomastia. Review of the MIP images confirms the above findings. IMPRESSION: 1. Motion degraded study without evidence of acute pulmonary embolism. 2. Moderate  right and moderate to large left pleural  effusions with multi segment collapse. 3. Cardiomegaly. Electronically Signed   By: Marnee Spring M.D.   On: 11/30/2018 04:14   [place] Chest (pleural Effusion)  Result Date: 12/01/2018 CLINICAL DATA:  Right pleural effusion EXAM: CHEST ULTRASOUND COMPARISON:  None. FINDINGS: Sonographic evaluation of the right hemithorax was performed for possible thoracentesis. Only minimal right-sided pleural effusion was noted. No safe window for thoracentesis is seen. IMPRESSION: Minimal right pleural effusion.  No safe window for thoracentesis. Electronically Signed   By: Alcide Clever M.D.   On: 12/01/2018 09:02   Dg Chest Port 1 View  Result Date: 11/29/2018 CLINICAL DATA:  Worsening shortness of breath. COPD. CHF. Ex-smoker. EXAM: PORTABLE CHEST 1 VIEW COMPARISON:  11/20/2018 FINDINGS: Patient rotated left. Cardiomegaly accentuated by AP portable technique. Atherosclerosis in the transverse aorta. Probable small layering bilateral pleural effusions. No pneumothorax. Interstitial edema is mild to moderate, increased. Left greater than right base airspace disease, similar on the left and increased on the right. IMPRESSION: Slightly worsened aeration, with increased congestive heart failure and right base atelectasis. Small bilateral pleural effusions and left base airspace disease remaining. Aortic Atherosclerosis (ICD10-I70.0). Electronically Signed   By: Jeronimo Greaves M.D.   On: 11/29/2018 15:23   US Thoracentesis Asp Pleural Space W/img Guide  Result Date: 11/30/2018 INDICATION: Left-sided pleural effusion EXAM: ULTRASOUND GUIDED LEFT THORACENTESIS MEDICATIONS: None. COMPLICATIONS: None immediate. PROCEDURE: An ultrasound guided thoracentesis was thoroughly discussed with the patient's wife and questions answered. The benefits, risks, alternatives and complications were also discussed. The patient understands and wishes to proceed with the procedure. Written consent was  obtained. Ultrasound was performed to localize and mark an adequate pocket of fluid in the left chest. The area was then prepped and draped in the normal sterile fashion. 1% Lidocaine was used for local anesthesia. Under ultrasound guidance a 6 Fr Safe-T-Centesis catheter was introduced. Thoracentesis was performed. The catheter was removed and a dressing applied. FINDINGS: A total of approximately 1.5 L of brownish fluid was removed. Samples were sent to the laboratory as requested by the clinical team. IMPRESSION: Successful ultrasound guided left thoracentesis yielding 1.5 L of pleural fluid. Electronically Signed   By: Alcide Clever M.D.   On: 11/30/2018 10:23    EKG:   Orders placed or performed during the hospital encounter of 11/23/18  . ED EKG 12-Lead  . ED EKG 12-Lead      Management plans discussed with the patient, family and they are in agreement.  CODE STATUS:     Code Status Orders  (From admission, onward)         Start     Ordered   11/23/18 0619  Full code  Continuous     11/23/18 0618        Code Status History    Date Active Date Inactive Code Status Order ID Comments User Context   11/14/2018 0314 11/19/2018 1822 Full Code [id number]  Arville Care Vernetta Honey, MD ED   10/27/2018 2101 10/31/2018 2110 Partial Code [id number]  Altamese Dilling, MD Inpatient   08/26/2018 1137 09/04/2018 1912 Full Code [id number]  Kennedy Bucker, MD ED   06/21/2018 1321 06/24/2018 2008 Partial Code [id number]  Milagros Loll, MD Inpatient   06/21/2018 0803 06/21/2018 1321 Full Code [id number]  Iran Ouch, MD Inpatient   06/01/2018 2009 06/04/2018 1718 Full Code [id number]  Donato Heinz, MD Inpatient   01/11/2018 0012 01/14/2018 1908 Full Code [id number]  Cammy Copa, MD Inpatient   12/01/2017 1924 12/05/2017 1948 Full Code  [id number]  Altamese Dilling, MD Inpatient   08/31/2017 0557 09/05/2017 1959 Full Code [id number]  Arnaldo Natal, MD Inpatient   06/10/2016 1934 06/13/2016 1816 Full Code  [id number]  Shaune Pollack, MD Inpatient   12/15/2015 0134 12/16/2015 1507 Full Code [id number]  Ihor Austin, MD ED    Advance Directive Documentation     Most Recent Value  Type of Advance Directive  Healthcare Power of Attorney, Living will  Pre-existing out of facility DNR order (yellow form or pink MOST form)  -  "MOST" Form in Place?  -      TOTAL TIME TAKING CARE OF THIS PATIENT: 35 minutes.    [name] [name] M.D on 12/01/2018 at 10:59 AM  Between 7am to 6pm - Pager - [phone]  After 6pm go to [email or web address] - password [email or web address]  [facility]  Office  [phone]  CC: Primary care physician; [name], MD   Note: This dictation was prepared with Dragon dictation along with smaller phrase technology. Any transcriptional errors that result from this process are unintentional.

## 2018-12-01 NOTE — Progress Notes (Signed)
Pt down for possible right thoracentesis although only minimal fluid found in right pleural space.  No safe window for thoracentesis.

## 2018-12-10 ENCOUNTER — Telehealth: Payer: Self-pay

## 2018-12-10 NOTE — Telephone Encounter (Signed)
   TELEPHONE CALL NOTE  This patient has been deemed a candidate for follow-up tele-health visit to limit community exposure during the Covid-19 pandemic. I spoke with the patient via phone to discuss instructions. The patient was advised to review the section on consent for treatment as well. The patient will receive a phone call 2-3 days prior to their E-Visit at which time consent will be verbally confirmed. A Virtual Office Visit appointment type has been scheduled for 12/14/2018 with Clarisa Kindred FNP.  ALLRED, AMANDA L, CMA 12/10/2018 10:15 AM

## 2018-12-10 NOTE — Telephone Encounter (Signed)
TELEPHONE CALL NOTE  Dylan [name] has been deemed a candidate for a follow-up tele-health visit to limit community exposure during the Covid-19 pandemic. I spoke with the patient via phone to ensure availability of phone/video source, confirm preferred email & phone number, discuss instructions and expectations, and review consent.   I reminded Dylan [name] to be prepared with any vital sign and/or heart rhythm information that could potentially be obtained via home monitoring, at the time of his visit.  Finally, I reminded Dylan [name] to expect an e-mail containing a link for their video-based visit approximately 15 minutes before his visit, or alternatively, a phone call at the time of his visit if his visit is planned to be a phone encounter.  Did the patient verbally consent to treatment as below? YES  Dylan [name], CMA 12/10/2018 10:15 AM  CONSENT FOR TELE-HEALTH VISIT - PLEASE REVIEW  I hereby voluntarily request, consent and authorize [facility] and its employed or contracted physicians, physician assistants, nurse practitioners or other licensed health care professionals (the Practitioner), to provide me with telemedicine health care services (the "Services") as deemed necessary by the treating Practitioner. I acknowledge and consent to receive the Services by the Practitioner via telemedicine. I understand that the telemedicine visit will involve communicating with the Practitioner through telephonic communication technology and the disclosure of certain medical information by electronic transmission. I acknowledge that I have been given the opportunity to request an in-person assessment or other available alternative prior to the telemedicine visit and am voluntarily participating in the telemedicine visit.  I understand that I have the right to withhold or withdraw my consent to the use of telemedicine in the course of my care at any time, without affecting my right  to future care or treatment, and that the Practitioner or I may terminate the telemedicine visit at any time. I understand that I have the right to inspect all information obtained and/or recorded in the course of the telemedicine visit and may receive copies of available information for a reasonable fee.  I understand that some of the potential risks of receiving the Services via telemedicine include:  Marland Kitchen Delay or interruption in medical evaluation due to technological equipment failure or disruption; . Information transmitted may not be sufficient (e.g. poor resolution of images) to allow for appropriate medical decision making by the Practitioner; and/or  . In rare instances, security protocols could fail, causing a breach of personal health information.  Furthermore, I acknowledge that it is my responsibility to provide information about my medical history, conditions and care that is complete and accurate to the best of my ability. I acknowledge that Practitioner's advice, recommendations, and/or decision may be based on factors not within their control, such as incomplete or inaccurate data provided by me or lack of visual representation. I understand that the practice of medicine is not an exact science and that Practitioner makes no warranties or guarantees regarding treatment outcomes. I acknowledge that I will receive a copy of this consent concurrently upon execution via email to the email address I last provided but may also request a printed copy by calling the office of [facility].    I understand that my insurance may be billed for this visit.   I have read or had this consent read to me. . I understand the contents of this consent, which adequately explains the benefits and risks of the Services being provided via telemedicine.  Marland Kitchen  I have been provided ample opportunity to ask questions regarding this consent and the Services and have had my questions answered to my  satisfaction. . I give my informed consent for the services to be provided through the use of telemedicine in my medical care  By participating in this telemedicine visit I agree to the above.

## 2018-12-14 ENCOUNTER — Ambulatory Visit: Payer: Medicare Other | Attending: Family | Admitting: Family

## 2018-12-14 ENCOUNTER — Other Ambulatory Visit: Payer: Self-pay

## 2018-12-14 ENCOUNTER — Encounter: Payer: Self-pay | Admitting: Family

## 2018-12-14 DIAGNOSIS — R531 Weakness: Secondary | ICD-10-CM

## 2018-12-14 DIAGNOSIS — I10 Essential (primary) hypertension: Secondary | ICD-10-CM

## 2018-12-14 DIAGNOSIS — J449 Chronic obstructive pulmonary disease, unspecified: Secondary | ICD-10-CM

## 2018-12-14 DIAGNOSIS — I5022 Chronic systolic (congestive) heart failure: Secondary | ICD-10-CM

## 2018-12-14 NOTE — Patient Instructions (Signed)
Write down blood pressure readings when physical therapy takes them.   Double-check medication list attached and call with any corrections that need to be made.

## 2018-12-15 NOTE — Progress Notes (Signed)
Virtual Visit via Telephone Note    Evaluation Performed:  Initial visit  This visit type was conducted due to national recommendations for restrictions regarding the COVID-19 Pandemic (e.g. social distancing).  This format is felt to be most appropriate for this patient at this time.  All issues noted in this document were discussed and addressed.  No physical exam was performed (except for noted visual exam findings with Video Visits).  Please refer to the patient's chart (MyChart message for video visits and phone note for telephone visits) for the patient's consent to telehealth for [facility]  Date:  12/15/2018   ID:  [name], DOB 1935/10/22, MRN [id number]  Patient Location:  [address] [place] [address]   Provider location:   [facility] [address] [place], [place] [address]  PCP:  [name], MD  Cardiologist:  Dorothyann Peng, MD Electrophysiologist:  None   Chief Complaint:  Fatigue  History of Present Illness:    Dylan [name] is a 83 y.o. male who presents via audio/video conferencing for a telehealth visit today.  Patient verified DOB and address.  The patient does not have symptoms concerning for COVID-19 infection (fever, chills, cough, or new SHORTNESS OF BREATH).   Patient states that he has moderate fatigue upon minimal exertion. He says that this has been present for several years. He has associated rhinorrhea, anxiety and shortness of breath along with this. He denies any difficulty sleeping, sore throat, head congestion, cough, chest pain, palpitations, swelling in his legs/ abdomen or dizziness. He is unable to weigh as he can't support himself on his legs. The aide that is also listening in to the call, with patient's permission, says that they have to use a hoyer lift to transfer patient.  PT will start coming 3 times / week.   Patient nor the aide knew what medications he is taking and did not have a list  present. Wife who does the medication pill box was at work.   Prior CV studies:   The following studies were reviewed today:  TEE report from 11/18/2018 reviewed and showed an EF of 35-40% along with moderate MR, trivial AR and mild/moderate TR.   Catheterization done 06/21/18 showed severe one-vessel disease with thrombotic occlusion of mid and distal RCA. Successful angioplasty, aspiration thrombectomy and 2 overlapped drug-eluting stents placed.   Past Medical History:  Diagnosis Date  . CAD (coronary artery disease)   . Cervicalgia   . CHF (congestive heart failure) (HCC)   . COPD (chronic obstructive pulmonary disease) (HCC)   . Diastolic heart failure (HCC)   . Foot drop, right   . History of kidney stones   . Hyperlipidemia    unspecified  . Hypertension   . Myocardial infarction (HCC)   . Osteoarthritis   . Shoulder pain, left   . Sleep apnea   . Tremor, essential    Past Surgical History:  Procedure Laterality Date  . APPLICATION OF WOUND VAC Right 11/13/2017   Procedure: APPLICATION OF WOUND VAC;  Surgeon: Annice Needy, MD;  Location: [facility] ORS;  Service: Vascular;  Laterality: Right;  . BACK SURGERY  06/2010  . CARDIAC CATHETERIZATION Left 04/30/2016   Procedure: Left Heart Cath and Coronary Angiography;  Surgeon: Laurier Nancy, MD;  Location: [facility] IN[facility] CV [facility];  Service: Cardiovascular;  Laterality: Left;  . COLONOSCOPY    . CORONARY ANGIOPLASTY    . CORONARY/GRAFT ACUTE MI REVASCULARIZATION  N/A 06/21/2018   Procedure: Coronary/Graft Acute MI Revascularization;  Surgeon: Iran Ouch, MD;  Location: [facility] CV [facility];  Service: Cardiovascular;  Laterality: N/A;  . KNEE ARTHROPLASTY Left 06/01/2018   Procedure: COMPUTER ASSISTED TOTAL KNEE ARTHROPLASTY;  Surgeon: Donato Heinz, MD;  Location: [facility] OR[facility];  Service: Orthopedics;  Laterality: Left;  . KNEE ARTHROSCOPY Left   . KNEE SURGERY Left 1998  . LEFT HEART CATH AND CORONARY ANGIOGRAPHY N/A 06/21/2018    Procedure: LEFT HEART CATH AND CORONARY ANGIOGRAPHY;  Surgeon: Iran Ouch, MD;  Location: [facility];  Service: Cardiovascular;  Laterality: N/A;  . ORIF FEMUR FRACTURE Left 08/27/2018   Procedure: OPEN REDUCTION INTERNAL FIXATION (ORIF) SUPRACONDYLAR FEMUR FRACTURE ABOVE PROSTHESIS, QUADRICEPS REPAIR;  Surgeon: Kennedy Bucker, MD;  Location: [facility];  Service: Orthopedics;  Laterality: Left;  . TEE WITHOUT CARDIOVERSION N/A 11/18/2018   Procedure: TRANSESOPHAGEAL ECHOCARDIOGRAM (TEE);  Surgeon: Lamar Blinks, MD;  Location: [facility];  Service: Cardiovascular;  Laterality: N/A;  . TONSILLECTOMY    . WOUND DEBRIDEMENT Right 11/13/2017   Procedure: DEBRIDEMENT WOUND;  Surgeon: Annice Needy, MD;  Location: [facility];  Service: Vascular;  Laterality: Right;     Current Meds  Medication Sig  . acetaminophen (TYLENOL) 500 MG tablet Take 1,000 mg by mouth 2 (two) times daily as needed for moderate pain.  Marland Kitchen alum & mag hydroxide-simeth (MAALOX PLUS) 400-400-40 MG/5ML suspension Take 30 mLs by mouth every 4 (four) hours as needed for indigestion.  . Ascorbic Acid (VITAMIN C PO) Take 500 mg by mouth daily.   Marland Kitchen atorvastatin (LIPITOR) 80 MG tablet Take 80 mg by mouth daily.   . budesonide (PULMICORT) 0.5 MG/2ML nebulizer solution Take 2 mLs (0.5 mg total) by nebulization 2 (two) times daily.  . carvedilol (COREG) 6.25 MG tablet Take 6.25 mg by mouth 2 (two) times daily with a meal.  . Cholecalciferol 100 MCG (4000 UT) TABS Take 1 tablet by mouth daily.  . clopidogrel (PLAVIX) 75 MG tablet Take 75 mg by mouth daily.  . feeding supplement, ENSURE ENLIVE, (ENSURE ENLIVE) LIQD Take 237 mLs by mouth 2 (two) times daily between meals.  . furosemide (LASIX) 40 MG tablet Take 1 tablet (40 mg total) by mouth 2 (two) times daily for 30 days.  Marland Kitchen ipratropium-albuterol (DUONEB) 0.5-2.5 (3) MG/3ML SOLN Take 3 mLs by nebulization every 6 (six) hours as needed (shortness of breath).   . isosorbide  mononitrate (IMDUR) 30 MG 24 hr tablet Take 30 mg by mouth daily.   Marland Kitchen ketotifen (ZADITOR) 0.025 % ophthalmic solution Place 1 drop into both eyes 2 (two) times daily.  Marland Kitchen loratadine (CLARITIN) 10 MG tablet Take 10 mg by mouth daily.   . magnesium hydroxide (MILK OF MAGNESIA) 400 MG/5ML suspension Take 30 mLs by mouth daily as needed for mild constipation.   . Multiple Vitamins-Minerals (CENTROVITE) TABS Take 1 tablet by mouth daily.  . nitroGLYCERIN (NITROSTAT) 0.3 MG SL tablet Place 0.3 mg under the tongue every 5 (five) minutes as needed for chest pain.  . pantoprazole (PROTONIX) 40 MG tablet Take 1 tablet (40 mg total) by mouth daily.  . polyethylene glycol (MIRALAX / GLYCOLAX) packet Take 17 g by mouth 2 (two) times daily as needed. Hold for loose stools.  . potassium chloride SA (K-DUR,KLOR-CON) 20 MEQ tablet Take 20 mEq by mouth 2 (two) times daily.   . predniSONE (DELTASONE) 50 MG tablet 1 po daily  . primidone (MYSOLINE) 50 MG tablet  Take 200 mg by mouth 2 (two) times daily.   . QUEtiapine (SEROQUEL) 50 MG tablet Take 1 tablet (50 mg total) by mouth at bedtime.  . sacubitril-valsartan (ENTRESTO) 24-26 MG Take 1 tablet by mouth 2 (two) times daily.  . sennosides-docusate sodium (SENOKOT-S) 8.6-50 MG tablet Take 2 tablets by mouth 2 (two) times daily as needed for constipation.   Marland Kitchen spironolactone (ALDACTONE) 25 MG tablet Take 12.5 mg by mouth daily.   . tamsulosin (FLOMAX) 0.4 MG CAPS capsule Take 1 capsule (0.4 mg total) by mouth daily.  Marland Kitchen umeclidinium-vilanterol (ANORO ELLIPTA) 62.5-25 MCG/INH AEPB Inhale 1 puff into the lungs daily.     Allergies:   Patient has no known allergies.   Social History   Tobacco Use  . Smoking status: Former Smoker    Packs/day: 0.25    Years: 58.00    Pack years: 14.50    Types: Cigarettes  . Smokeless tobacco: Former Neurosurgeon  . Tobacco comment: quit the begginning of septembet 2019  Substance Use Topics  . Alcohol use: No    Alcohol/week: 0.0  standard drinks  . Drug use: No     Family Hx: The patient's family history includes Bladder Cancer in his brother; Cancer in his mother; Colon cancer in his mother; Diabetes in his son; Heart disease in his father; Lung cancer in his mother; Tremor in his brother, father, and sister.  ROS:   Please see the history of present illness.     All other systems reviewed and are negative.   Labs/Other Tests and Data Reviewed:    Recent Labs: 11/23/2018: ALT 50; B Natriuretic Peptide 850.0; TSH 4.119 11/26/2018: Hemoglobin 10.0; Platelets 186 11/30/2018: BUN 45; Creatinine, Ser 1.26; Magnesium 2.3; Potassium 3.7; Sodium 142   Recent Lipid Panel Lab Results  Component Value Date/Time   CHOL 100 09/08/2017 04:00 AM   TRIG 61 09/08/2017 04:00 AM   HDL 34 (L) 09/08/2017 04:00 AM   CHOLHDL 2.9 09/08/2017 04:00 AM   LDLCALC 54 09/08/2017 04:00 AM    Wt Readings from Last 3 Encounters:  11/29/18 280 lb 9.6 oz (127.3 kg)  11/19/18 274 lb 4.8 oz (124.4 kg)  10/31/18 276 lb 14.4 oz (125.6 kg)     Exam:    Vital Signs:  There were no vitals taken for this visit.   Well nourished, well developed male in no  acute distress.   ASSESSMENT & PLAN:    1. Chronic heart failure with reduced ejection fraction- - NYHA class III - euvolemic per patient's description - unable to weigh safely at home due to patient's inability to support himself on his legs; currently must use hoyer lift to transfer patient - since he is unable to weigh daily, discussed importance of monitoring for swelling in his legs/ abdomen by pressing on skin or being aware if clothes are more tight fitting - not adding salt to his food and the aides/ family that cook for him also do not add salt while cooking - consider titrating entresto and/ or carvedilol in the future once we get BP readings - saw cardiology Juliann Pares) 08/24/18 - BNP 11/23/2018 was 850.0  2: HTN- - aide says that PT checks his BP but she doesn't know  what it is. Instructed her to start writing down the BP readings so that we can track them - saw PCP Arlana Pouch) last week - BMP from 11/30/2018 reviewed and showed sodium 142, potassium 3.7, creatinine 1.26 and GFR 53  3: COPD-  -  wears oxygen at 2L at bedtime - saw pulmonology Meredeth Ide([place]) 03/18/18  4: Weakness- - patient was able to walk / stand December 2019 and has been unable to do so since that time - currently is being transferred at home via a hoyer lift - will be having PT coming in 3 times / week  COVID-19 Education: The signs and symptoms of COVID-19 were discussed with the patient and how to seek care for testing (follow up with PCP or arrange E-visit).  The importance of social distancing was discussed today.  Patient Risk:   After full review of this patients clinical status, I feel that they are at least moderate risk at this time.  Time:   Today, I have spent 12 minutes with the patient with telehealth technology discussing diet, heart failure and symptoms to call about. Encouraged patient and the aide to have a medication list available to review at next phone call.   Medication Adjustments/Labs and Tests Ordered: Current medicines are reviewed at length with the patient today.  Concerns regarding medicines are outlined above.   Tests Ordered: No orders of the defined types were placed in this encounter.  Medication Changes: No orders of the defined types were placed in this encounter.   Disposition:  Follow-up in 1 month or sooner for any questions/problems before then.   Signed, Delma Freezeina A Merrie Epler, FNP  12/15/2018 8:49 AM    [facility]

## 2018-12-24 ENCOUNTER — Other Ambulatory Visit: Payer: Self-pay | Admitting: Family

## 2018-12-24 MED ORDER — SENNA-DOCUSATE SODIUM 8.6-50 MG PO TABS: 2.0000 | ORAL_TABLET | Freq: Every day | ORAL | Status: AC | PRN

## 2018-12-24 MED ORDER — UMECLIDINIUM-VILANTEROL 62.5-25 MCG/INH IN AEPB: 1.0000 | INHALATION_SPRAY | RESPIRATORY_TRACT | Status: AC | PRN

## 2018-12-24 MED ORDER — CHOLECALCIFEROL 25 MCG (1000 UT) PO TABS: 1000.0000 [IU] | ORAL_TABLET | Freq: Every day | ORAL | Status: AC

## 2018-12-24 MED ORDER — TORSEMIDE 20 MG PO TABS: 10.0000 mg | ORAL_TABLET | Freq: Every day | ORAL | Status: DC

## 2018-12-24 MED ORDER — GABAPENTIN 300 MG PO CAPS: 300.0000 mg | ORAL_CAPSULE | Freq: Every day | ORAL | Status: DC

## 2018-12-24 MED ORDER — POTASSIUM CHLORIDE CRYS ER 20 MEQ PO TBCR: 20.0000 meq | EXTENDED_RELEASE_TABLET | Freq: Every day | ORAL | Status: AC

## 2018-12-24 MED ORDER — KETOTIFEN FUMARATE 0.025 % OP SOLN: 1.0000 [drp] | OPHTHALMIC | 0 refills | Status: AC | PRN

## 2018-12-24 MED ORDER — QUETIAPINE FUMARATE 50 MG PO TABS: 50.0000 mg | ORAL_TABLET | Freq: Every evening | ORAL | 0 refills | Status: AC | PRN

## 2018-12-24 MED ORDER — LORATADINE 10 MG PO TABS: 10.0000 mg | ORAL_TABLET | ORAL | Status: AC | PRN

## 2018-12-24 MED ORDER — FOLIC ACID 400 MCG PO TABS: 400.0000 ug | ORAL_TABLET | Freq: Every day | ORAL | Status: AC

## 2018-12-24 MED ORDER — PREDNISONE 20 MG PO TABS: 20.0000 mg | ORAL_TABLET | Freq: Every day | ORAL | Status: DC

## 2019-01-07 ENCOUNTER — Telehealth: Payer: Self-pay

## 2019-01-07 NOTE — Telephone Encounter (Signed)
   TELEPHONE CALL NOTE  This patient has been deemed a candidate for follow-up tele-health visit to limit community exposure during the Covid-19 pandemic. I spoke with the patient via phone to discuss instructions. The patient was advised to review the section on consent for treatment as well. The patient will receive a phone call 2-3 days prior to their E-Visit at which time consent will be verbally confirmed. A Virtual Office Visit appointment type has been scheduled for 01/11/2019 with Clarisa Kindred FNP.  Nira Retort L, CMA 01/07/2019 11:45 AM

## 2019-01-07 NOTE — Telephone Encounter (Signed)
TELEPHONE CALL NOTE  Dylan [name] has been deemed a candidate for a follow-up tele-health visit to limit community exposure during the Covid-19 pandemic. I spoke with the patient via phone to ensure availability of phone/video source, confirm preferred email & phone number, discuss instructions and expectations, and review consent.   I reminded Dylan [name] to be prepared with any vital sign and/or heart rhythm information that could potentially be obtained via home monitoring, at the time of his visit.  Finally, I reminded Dylan [name] to expect an e-mail containing a link for their video-based visit approximately 15 minutes before his visit, or alternatively, a phone call at the time of his visit if his visit is planned to be a phone encounter.  Did the patient verbally consent to treatment as below? yes  Dylan [name], CMA 01/07/2019 11:46 AM  CONSENT FOR TELE-HEALTH VISIT - PLEASE REVIEW  I hereby voluntarily request, consent and authorize [facility] and its employed or contracted physicians, physician assistants, nurse practitioners or other licensed health care professionals (the Practitioner), to provide me with telemedicine health care services (the "Services") as deemed necessary by the treating Practitioner. I acknowledge and consent to receive the Services by the Practitioner via telemedicine. I understand that the telemedicine visit will involve communicating with the Practitioner through telephonic communication technology and the disclosure of certain medical information by electronic transmission. I acknowledge that I have been given the opportunity to request an in-person assessment or other available alternative prior to the telemedicine visit and am voluntarily participating in the telemedicine visit.  I understand that I have the right to withhold or withdraw my consent to the use of telemedicine in the course of my care at any time, without affecting my right  to future care or treatment, and that the Practitioner or I may terminate the telemedicine visit at any time. I understand that I have the right to inspect all information obtained and/or recorded in the course of the telemedicine visit and may receive copies of available information for a reasonable fee.  I understand that some of the potential risks of receiving the Services via telemedicine include:  Marland Kitchen Delay or interruption in medical evaluation due to technological equipment failure or disruption; . Information transmitted may not be sufficient (e.g. poor resolution of images) to allow for appropriate medical decision making by the Practitioner; and/or  . In rare instances, security protocols could fail, causing a breach of personal health information.  Furthermore, I acknowledge that it is my responsibility to provide information about my medical history, conditions and care that is complete and accurate to the best of my ability. I acknowledge that Practitioner's advice, recommendations, and/or decision may be based on factors not within their control, such as incomplete or inaccurate data provided by me or lack of visual representation. I understand that the practice of medicine is not an exact science and that Practitioner makes no warranties or guarantees regarding treatment outcomes. I acknowledge that I will receive a copy of this consent concurrently upon execution via email to the email address I last provided but may also request a printed copy by calling the office of [facility].    I understand that my insurance may be billed for this visit.   I have read or had this consent read to me. . I understand the contents of this consent, which adequately explains the benefits and risks of the Services being provided via telemedicine.  Marland Kitchen  I have been provided ample opportunity to ask questions regarding this consent and the Services and have had my questions answered to my  satisfaction. . I give my informed consent for the services to be provided through the use of telemedicine in my medical care  By participating in this telemedicine visit I agree to the above.

## 2019-01-11 ENCOUNTER — Other Ambulatory Visit: Payer: Self-pay

## 2019-01-11 ENCOUNTER — Ambulatory Visit: Payer: Medicare Other | Attending: Family | Admitting: Family

## 2019-01-11 ENCOUNTER — Encounter: Payer: Self-pay | Admitting: Family

## 2019-01-11 VITALS — BP 124/64 | HR 68

## 2019-01-11 DIAGNOSIS — R531 Weakness: Secondary | ICD-10-CM

## 2019-01-11 DIAGNOSIS — J449 Chronic obstructive pulmonary disease, unspecified: Secondary | ICD-10-CM

## 2019-01-11 DIAGNOSIS — I5022 Chronic systolic (congestive) heart failure: Secondary | ICD-10-CM

## 2019-01-11 DIAGNOSIS — I10 Essential (primary) hypertension: Secondary | ICD-10-CM

## 2019-01-11 NOTE — Progress Notes (Signed)
Virtual Visit via Telephone Note    Evaluation Performed:  Follow-up visit  This visit type was conducted due to national recommendations for restrictions regarding the COVID-19 Pandemic (e.g. social distancing).  This format is felt to be most appropriate for this patient at this time.  All issues noted in this document were discussed and addressed.  No physical exam was performed (except for noted visual exam findings with Video Visits).  Please refer to the patient's chart (MyChart message for video visits and phone note for telephone visits) for the patient's consent to telehealth for [facility]  Date:  01/11/2019   ID:  Dylan [name], DOB 1935-09-16, MRN [id number]  Patient Location:  [address] [place] [address]   Provider location:   [facility] [address] [place], [place] [address]  PCP:  [name], MD  Cardiologist:  Dorothyann Peng, MD Electrophysiologist:  None   Chief Complaint:  Shortness of breath  History of Present Illness:    Dylan [name] is a 84 y.o. male who presents via audio/video conferencing for a telehealth visit today.  Patient verified DOB and address.  The patient does not have symptoms concerning for COVID-19 infection (fever, chills, cough, or new SHORTNESS OF BREATH).   Patient reports moderate shortness of breath upon minimal exertion. He describes this as chronic in nature having been present for several years. He has associated anxiety (panic attacks), cough, difficulty sleeping and fatigue along with this. He denies dizziness, swelling in legs/ abdomen, palpitations or chest pain. He is unable to weigh because he can't support himself on his own and is currently moved via a hoyer lift.  Prior CV studies:   The following studies were reviewed today:  Echo report from 11/18/2018 reviewed and showed an EF of 35-40% along with moderate MR and mild/ moderate TR.  Past Medical History:  Diagnosis Date  .  CAD (coronary artery disease)   . Cervicalgia   . CHF (congestive heart failure) (HCC)   . COPD (chronic obstructive pulmonary disease) (HCC)   . Diastolic heart failure (HCC)   . Foot drop, right   . History of kidney stones   . Hyperlipidemia    unspecified  . Hypertension   . Myocardial infarction (HCC)   . Osteoarthritis   . Shoulder pain, left   . Sleep apnea   . Tremor, essential    Past Surgical History:  Procedure Laterality Date  . APPLICATION OF WOUND VAC Right 11/13/2017   Procedure: APPLICATION OF WOUND VAC;  Surgeon: Annice Needy, MD;  Location: [facility] ORS;  Service: Vascular;  Laterality: Right;  . BACK SURGERY  06/2010  . CARDIAC CATHETERIZATION Left 04/30/2016   Procedure: Left Heart Cath and Coronary Angiography;  Surgeon: Laurier Nancy, MD;  Location: [facility] INVASIVE CV LAB;  Service: Cardiovascular;  Laterality: Left;  . COLONOSCOPY    . CORONARY ANGIOPLASTY    . CORONARY/GRAFT ACUTE MI REVASCULARIZATION N/A 06/21/2018   Procedure: Coronary/Graft Acute MI Revascularization;  Surgeon: Iran Ouch, MD;  Location: [facility] INVASIVE CV LAB;  Service: Cardiovascular;  Laterality: N/A;  . KNEE ARTHROPLASTY Left 06/01/2018   Procedure: COMPUTER ASSISTED TOTAL KNEE ARTHROPLASTY;  Surgeon: Donato Heinz, MD;  Location: [facility] ORS;  Service: Orthopedics;  Laterality: Left;  . KNEE ARTHROSCOPY Left   . KNEE SURGERY Left 1998  . LEFT HEART CATH AND CORONARY ANGIOGRAPHY N/A 06/21/2018   Procedure: LEFT HEART CATH AND CORONARY ANGIOGRAPHY;  Surgeon: Iran OuchArida, Muhammad A, MD;  Location: [facility] INVASIVE CV LAB;  Service: Cardiovascular;  Laterality: N/A;  . ORIF FEMUR FRACTURE Left 08/27/2018   Procedure: OPEN REDUCTION INTERNAL FIXATION (ORIF) SUPRACONDYLAR FEMUR FRACTURE ABOVE PROSTHESIS, QUADRICEPS REPAIR;  Surgeon: Kennedy BuckerMenz, Michael, MD;  Location: [facility] OR[facility];  Service: Orthopedics;  Laterality: Left;  . TEE WITHOUT CARDIOVERSION N/A 11/18/2018   Procedure: TRANSESOPHAGEAL ECHOCARDIOGRAM  (TEE);  Surgeon: Lamar BlinksKowalski, Bruce J, MD;  Location: [facility] ORS;  Service: Cardiovascular;  Laterality: N/A;  . TONSILLECTOMY    . WOUND DEBRIDEMENT Right 11/13/2017   Procedure: DEBRIDEMENT WOUND;  Surgeon: Annice Needyew, Jason S, MD;  Location: [facility] OR[facility];  Service: Vascular;  Laterality: Right;     Current Meds  Medication Sig  . acetaminophen (TYLENOL) 500 MG tablet Take 1,000 mg by mouth 2 (two) times daily as needed for moderate pain.  . Ascorbic Acid (VITAMIN C PO) Take 500 mg by mouth daily.   Marland Kitchen. aspirin EC 81 MG tablet Take 81 mg by mouth daily.  Marland Kitchen. atorvastatin (LIPITOR) 80 MG tablet Take 80 mg by mouth daily.   . carvedilol (COREG) 6.25 MG tablet Take 6.25 mg by mouth 2 (two) times daily with a meal.  . Cholecalciferol (VITAMIN D-1000 MAX ST) 25 MCG (1000 UT) tablet Take 1 tablet (1,000 Units total) by mouth daily.  . clopidogrel (PLAVIX) 75 MG tablet Take 75 mg by mouth daily.  . folic acid (V-R FOLIC ACID) 400 MCG tablet Take 1 tablet (400 mcg total) by mouth daily.  . furosemide (LASIX) 40 MG tablet Take 40 mg by mouth 2 (two) times daily.  Marland Kitchen. gabapentin (NEURONTIN) 300 MG capsule Take 1 capsule (300 mg total) by mouth at bedtime.  Marland Kitchen. ipratropium-albuterol (DUONEB) 0.5-2.5 (3) MG/3ML SOLN Take 3 mLs by nebulization every 6 (six) hours as needed (shortness of breath).   . isosorbide mononitrate (IMDUR) 30 MG 24 hr tablet Take 30 mg by mouth daily.   Marland Kitchen. ketotifen (ZADITOR) 0.025 % ophthalmic solution Place 1 drop into both eyes as needed.  . magnesium hydroxide (MILK OF MAGNESIA) 400 MG/5ML suspension Take 30 mLs by mouth daily as needed for mild constipation.   . Multiple Vitamins-Minerals (CENTROVITE) TABS Take 1 tablet by mouth daily.  . nitroGLYCERIN (NITROSTAT) 0.3 MG SL tablet Place 0.3 mg under the tongue every 5 (five) minutes as needed for chest pain.  . pantoprazole (PROTONIX) 40 MG tablet Take 1 tablet (40 mg total) by mouth daily.  . polyethylene glycol (MIRALAX / GLYCOLAX) packet Take 17  g by mouth 2 (two) times daily as needed. Hold for loose stools.  . potassium chloride SA (K-DUR) 20 MEQ tablet Take 1 tablet (20 mEq total) by mouth daily.  . predniSONE (DELTASONE) 20 MG tablet Take 1 tablet (20 mg total) by mouth daily. 1 po daily  . primidone (MYSOLINE) 50 MG tablet Take 200 mg by mouth 2 (two) times daily.   . QUEtiapine (SEROQUEL) 50 MG tablet Take 1 tablet (50 mg total) by mouth at bedtime as needed.  . sacubitril-valsartan (ENTRESTO) 24-26 MG Take 1 tablet by mouth 2 (two) times daily.  . sennosides-docusate sodium (SENOKOT-S) 8.6-50 MG tablet Take 2 tablets by mouth daily as needed for constipation.  Marland Kitchen. spironolactone (ALDACTONE) 25 MG tablet Take 12.5 mg by mouth daily.   . tamsulosin (FLOMAX) 0.4 MG CAPS capsule Take 1 capsule (0.4 mg total) by mouth daily.  Marland Kitchen. umeclidinium-vilanterol (ANORO ELLIPTA) 62.5-25 MCG/INH AEPB Inhale 1 puff into the lungs as needed.  Allergies:   Patient has no known allergies.   Social History   Tobacco Use  . Smoking status: Former Smoker    Packs/day: 0.25    Years: 58.00    Pack years: 14.50    Types: Cigarettes  . Smokeless tobacco: Former Neurosurgeon  . Tobacco comment: quit the begginning of septembet 2019  Substance Use Topics  . Alcohol use: No    Alcohol/week: 0.0 standard drinks  . Drug use: No     Family Hx: The patient's family history includes Bladder Cancer in his brother; Cancer in his mother; Colon cancer in his mother; Diabetes in his son; Heart disease in his father; Lung cancer in his mother; Tremor in his brother, father, and sister.  ROS:   Please see the history of present illness.     All other systems reviewed and are negative.   Labs/Other Tests and Data Reviewed:    Recent Labs: 11/23/2018: ALT 50; B Natriuretic Peptide 850.0; TSH 4.119 11/26/2018: Hemoglobin 10.0; Platelets 186 11/30/2018: BUN 45; Creatinine, Ser 1.26; Magnesium 2.3; Potassium 3.7; Sodium 142   Recent Lipid Panel Lab Results   Component Value Date/Time   CHOL 100 09/08/2017 04:00 AM   TRIG 61 09/08/2017 04:00 AM   HDL 34 (L) 09/08/2017 04:00 AM   CHOLHDL 2.9 09/08/2017 04:00 AM   LDLCALC 54 09/08/2017 04:00 AM    Wt Readings from Last 3 Encounters:  11/29/18 280 lb 9.6 oz (127.3 kg)  11/19/18 274 lb 4.8 oz (124.4 kg)  10/31/18 276 lb 14.4 oz (125.6 kg)     Exam:    Vital Signs:  BP 124/64 Comment: self-reported  Pulse 68 Comment: self-reported   Well nourished, well developed male in no  acute distress.   ASSESSMENT & PLAN:    1. Chronic heart failure with reduced ejection fraction- - NYHA class III - euvolemic per patient's description of symptoms - unable to weigh safely at home due to patient's inability to support himself on his legs; currently must use hoyer lift to transfer patient - since he is unable to weigh daily, discussed importance of monitoring for swelling in his legs/ abdomen by pressing on skin or being aware if clothes are more tight fitting - not adding salt to his food and the aides/ family that cook for him also do not add salt while cooking - looks like entresto was titrated at recent cardiology visit although aide lists the old dose; patient says that "things were changed" - saw cardiology Juliann Pares) 01/07/2019 - BNP 11/23/2018 was 850.0  2: HTN- - home BP looks good today - saw PCP Arlana Pouch) couple of weeks ago - BMP from 11/30/2018 reviewed and showed sodium 142, potassium 3.7, creatinine 1.26 and GFR 53  3: COPD-  - wears oxygen at 2L at bedtime - saw pulmonology Meredeth Ide) 01/07/2019  4: Weakness- - patient was able to walk / stand December 2019 and has been unable to do so since that time - currently is being transferred at home via a hoyer lift - PT coming in 3 times / week   COVID-19 Education: The signs and symptoms of COVID-19 were discussed with the patient and how to seek care for testing (follow up with PCP or arrange E-visit).  The importance of social  distancing was discussed today.  Patient Risk:   After full review of this patients clinical status, I feel that they are at least moderate risk at this time.  Time:   Today, I have spent 10  minutes with the patient with telehealth technology discussing diet and symptoms to report.     Medication Adjustments/Labs and Tests Ordered: Current medicines are reviewed at length with the patient today.  Concerns regarding medicines are outlined above.   Tests Ordered: No orders of the defined types were placed in this encounter.  Medication Changes: No orders of the defined types were placed in this encounter.   Disposition:  Follow-up in 1 month or sooner for any questions/problems before then.   Signed, Delma Freeze, FNP  01/11/2019 2:40 PM    [facility]

## 2019-01-18 ENCOUNTER — Telehealth: Payer: Self-pay | Admitting: Primary Care

## 2019-01-18 NOTE — Telephone Encounter (Signed)
T/c to make appt for f/u. Wife [name] answered and states pt is out of the home at an appt.. Patient will be available tomorrow pm and to return call.

## 2019-01-20 ENCOUNTER — Telehealth (HOSPITAL_COMMUNITY): Payer: Self-pay

## 2019-01-20 NOTE — Telephone Encounter (Signed)
Contacted Dylan [name] this am about [employer].  He is interested since he is back home out of Rehab facility.  Will visit Wed May 27 at his home to introduce myself and the program.   Earmon Phoenix [place] EMT-Paramedic [phone]

## 2019-01-27 ENCOUNTER — Other Ambulatory Visit (HOSPITAL_COMMUNITY): Payer: Self-pay

## 2019-01-27 ENCOUNTER — Encounter (HOSPITAL_COMMUNITY): Payer: Self-pay

## 2019-01-27 NOTE — Progress Notes (Signed)
Today was first visit with Dylan [name].  He states feels tired but denies pain.  Aid states swelling is about normal, some in left leg and ankle area.  She states 2 days ago little urine output but today been ok.  She states his penis is sucken in and she is worried he may have infection.  He looks like he is short of breath but he states breathing is good for him today.  He is able to take a deep breath, hard to listen to.  He is laying in Medical illustrator and he is very weak, unable to help you to lean forward.  Aid states he was able 3 weeks ok to help move him but not this past week.  She states he has went down hill in past week.  He states he watches his sodium but ate 2 small sausage biscuits this am, ham and tomato sandwich for lunch.  Appetite been good today.  He denies abdomen feeling full.  He denies chest pain and past few weeks.  His wife does his medications before leaving for work, unable to verify meds.  Will call wife later this evening.  Dylan [name] states he wants to be part of the program after I explained to him what it was.  Then a [facility] PT came in and she advised they are doing a Hospice consult at 5:00 tomorrow and they have a urinalysis ordered for him.  Will check in tomorrow to see if he goes on Hospice or not tomorrow.  Left my information and information about the program with him.      Earmon Phoenix [place] EMT-Paramedic [phone]

## 2019-01-30 ENCOUNTER — Other Ambulatory Visit: Payer: Self-pay

## 2019-01-30 ENCOUNTER — Inpatient Hospital Stay
Admission: EM | Admit: 2019-01-30 | Discharge: 2019-02-03 | DRG: 291 | Disposition: A | Discharge status: Home Health | Payer: Medicare Other | Attending: Internal Medicine | Admitting: Internal Medicine

## 2019-01-30 ENCOUNTER — Emergency Department: Payer: Medicare Other

## 2019-01-30 DIAGNOSIS — I110 Hypertensive heart disease with heart failure: Secondary | ICD-10-CM | POA: Diagnosis present

## 2019-01-30 DIAGNOSIS — Z951 Presence of aortocoronary bypass graft: Secondary | ICD-10-CM

## 2019-01-30 DIAGNOSIS — G9341 Metabolic encephalopathy: Secondary | ICD-10-CM | POA: Diagnosis present

## 2019-01-30 DIAGNOSIS — I5023 Acute on chronic systolic (congestive) heart failure: Secondary | ICD-10-CM | POA: Diagnosis present

## 2019-01-30 DIAGNOSIS — J449 Chronic obstructive pulmonary disease, unspecified: Secondary | ICD-10-CM | POA: Diagnosis present

## 2019-01-30 DIAGNOSIS — B9689 Other specified bacterial agents as the cause of diseases classified elsewhere: Secondary | ICD-10-CM | POA: Diagnosis present

## 2019-01-30 DIAGNOSIS — N3090 Cystitis, unspecified without hematuria: Secondary | ICD-10-CM | POA: Diagnosis present

## 2019-01-30 DIAGNOSIS — Z20828 Contact with and (suspected) exposure to other viral communicable diseases: Secondary | ICD-10-CM | POA: Diagnosis present

## 2019-01-30 DIAGNOSIS — Z833 Family history of diabetes mellitus: Secondary | ICD-10-CM | POA: Diagnosis not present

## 2019-01-30 DIAGNOSIS — Z8249 Family history of ischemic heart disease and other diseases of the circulatory system: Secondary | ICD-10-CM

## 2019-01-30 DIAGNOSIS — I252 Old myocardial infarction: Secondary | ICD-10-CM

## 2019-01-30 DIAGNOSIS — Z87440 Personal history of urinary (tract) infections: Secondary | ICD-10-CM

## 2019-01-30 DIAGNOSIS — I340 Nonrheumatic mitral (valve) insufficiency: Secondary | ICD-10-CM | POA: Diagnosis not present

## 2019-01-30 DIAGNOSIS — I618 Other nontraumatic intracerebral hemorrhage: Secondary | ICD-10-CM | POA: Clinically undetermined

## 2019-01-30 DIAGNOSIS — Z87891 Personal history of nicotine dependence: Secondary | ICD-10-CM | POA: Diagnosis not present

## 2019-01-30 DIAGNOSIS — I248 Other forms of acute ischemic heart disease: Secondary | ICD-10-CM | POA: Diagnosis present

## 2019-01-30 DIAGNOSIS — E785 Hyperlipidemia, unspecified: Secondary | ICD-10-CM | POA: Diagnosis present

## 2019-01-30 DIAGNOSIS — I6381 Other cerebral infarction due to occlusion or stenosis of small artery: Secondary | ICD-10-CM | POA: Diagnosis not present

## 2019-01-30 DIAGNOSIS — G250 Essential tremor: Secondary | ICD-10-CM | POA: Diagnosis present

## 2019-01-30 DIAGNOSIS — I639 Cerebral infarction, unspecified: Secondary | ICD-10-CM | POA: Diagnosis not present

## 2019-01-30 DIAGNOSIS — I959 Hypotension, unspecified: Secondary | ICD-10-CM | POA: Diagnosis present

## 2019-01-30 DIAGNOSIS — M86671 Other chronic osteomyelitis, right ankle and foot: Secondary | ICD-10-CM | POA: Diagnosis present

## 2019-01-30 DIAGNOSIS — I5043 Acute on chronic combined systolic (congestive) and diastolic (congestive) heart failure: Secondary | ICD-10-CM | POA: Diagnosis present

## 2019-01-30 DIAGNOSIS — R29712 NIHSS score 12: Secondary | ICD-10-CM | POA: Diagnosis not present

## 2019-01-30 DIAGNOSIS — R531 Weakness: Secondary | ICD-10-CM | POA: Diagnosis present

## 2019-01-30 DIAGNOSIS — Z7982 Long term (current) use of aspirin: Secondary | ICD-10-CM

## 2019-01-30 DIAGNOSIS — Z96649 Presence of unspecified artificial hip joint: Secondary | ICD-10-CM | POA: Diagnosis present

## 2019-01-30 DIAGNOSIS — J9610 Chronic respiratory failure, unspecified whether with hypoxia or hypercapnia: Secondary | ICD-10-CM | POA: Diagnosis present

## 2019-01-30 DIAGNOSIS — G4733 Obstructive sleep apnea (adult) (pediatric): Secondary | ICD-10-CM | POA: Diagnosis present

## 2019-01-30 DIAGNOSIS — Z9981 Dependence on supplemental oxygen: Secondary | ICD-10-CM | POA: Diagnosis not present

## 2019-01-30 DIAGNOSIS — I619 Nontraumatic intracerebral hemorrhage, unspecified: Secondary | ICD-10-CM | POA: Diagnosis not present

## 2019-01-30 DIAGNOSIS — Z7951 Long term (current) use of inhaled steroids: Secondary | ICD-10-CM

## 2019-01-30 DIAGNOSIS — E874 Mixed disorder of acid-base balance: Secondary | ICD-10-CM | POA: Diagnosis not present

## 2019-01-30 DIAGNOSIS — Z993 Dependence on wheelchair: Secondary | ICD-10-CM | POA: Diagnosis not present

## 2019-01-30 DIAGNOSIS — Z7952 Long term (current) use of systemic steroids: Secondary | ICD-10-CM

## 2019-01-30 DIAGNOSIS — Z87442 Personal history of urinary calculi: Secondary | ICD-10-CM

## 2019-01-30 DIAGNOSIS — Z9861 Coronary angioplasty status: Secondary | ICD-10-CM

## 2019-01-30 DIAGNOSIS — Z96652 Presence of left artificial knee joint: Secondary | ICD-10-CM | POA: Diagnosis present

## 2019-01-30 DIAGNOSIS — Z7902 Long term (current) use of antithrombotics/antiplatelets: Secondary | ICD-10-CM

## 2019-01-30 DIAGNOSIS — I2510 Atherosclerotic heart disease of native coronary artery without angina pectoris: Secondary | ICD-10-CM | POA: Diagnosis present

## 2019-01-30 LAB — CBC WITH DIFFERENTIAL/PLATELET
Abs Immature Granulocytes: 0.1 10*3/uL — ABNORMAL HIGH (ref 0.00–0.07)
Basophils Absolute: 0.1 10*3/uL (ref 0.0–0.1)
Basophils Relative: 1 %
Eosinophils Absolute: 0.2 10*3/uL (ref 0.0–0.5)
Eosinophils Relative: 2 %
HCT: 40.9 % (ref 39.0–52.0)
Hemoglobin: 12.2 g/dL — ABNORMAL LOW (ref 13.0–17.0)
Immature Granulocytes: 1 %
Lymphocytes Relative: 19 %
Lymphs Abs: 2.1 10*3/uL (ref 0.7–4.0)
MCH: 30.1 pg (ref 26.0–34.0)
MCHC: 29.8 g/dL — ABNORMAL LOW (ref 30.0–36.0)
MCV: 101 fL — ABNORMAL HIGH (ref 80.0–100.0)
Monocytes Absolute: 1 10*3/uL (ref 0.1–1.0)
Monocytes Relative: 9 %
Neutro Abs: 7.3 10*3/uL (ref 1.7–7.7)
Neutrophils Relative %: 68 %
Platelets: 217 10*3/uL (ref 150–400)
RBC: 4.05 MIL/uL — ABNORMAL LOW (ref 4.22–5.81)
RDW: 18.7 % — ABNORMAL HIGH (ref 11.5–15.5)
WBC: 10.7 10*3/uL — ABNORMAL HIGH (ref 4.0–10.5)
nRBC: 0 % (ref 0.0–0.2)

## 2019-01-30 LAB — PROTIME-INR
INR: 1.1 (ref 0.8–1.2)
Prothrombin Time: 13.9 seconds (ref 11.4–15.2)

## 2019-01-30 LAB — URINALYSIS, COMPLETE (UACMP) WITH MICROSCOPIC
Bilirubin Urine: NEGATIVE
Glucose, UA: NEGATIVE mg/dL
Hgb urine dipstick: NEGATIVE
Ketones, ur: NEGATIVE mg/dL
Leukocytes,Ua: NEGATIVE
Nitrite: POSITIVE — AB
Protein, ur: 100 mg/dL — AB
Specific Gravity, Urine: 1.018 (ref 1.005–1.030)
pH: 5 (ref 5.0–8.0)

## 2019-01-30 LAB — SARS CORONAVIRUS 2 BY RT PCR (HOSPITAL ORDER, PERFORMED IN ~~LOC~~ HOSPITAL LAB): SARS Coronavirus 2: NEGATIVE

## 2019-01-30 LAB — COMPREHENSIVE METABOLIC PANEL
ALT: 50 U/L — ABNORMAL HIGH (ref 0–44)
AST: 49 U/L — ABNORMAL HIGH (ref 15–41)
Albumin: 3.1 g/dL — ABNORMAL LOW (ref 3.5–5.0)
Alkaline Phosphatase: 151 U/L — ABNORMAL HIGH (ref 38–126)
Anion gap: 9 (ref 5–15)
BUN: 39 mg/dL — ABNORMAL HIGH (ref 8–23)
CO2: 36 mmol/L — ABNORMAL HIGH (ref 22–32)
Calcium: 9 mg/dL (ref 8.9–10.3)
Chloride: 99 mmol/L (ref 98–111)
Creatinine, Ser: 1.15 mg/dL (ref 0.61–1.24)
GFR calc Af Amer: >60 mL/min (ref >60)
GFR calc non Af Amer: 59 mL/min — ABNORMAL LOW (ref >60)
Glucose, Bld: 168 mg/dL — ABNORMAL HIGH (ref 70–99)
Potassium: 4.6 mmol/L (ref 3.5–5.1)
Sodium: 144 mmol/L (ref 135–145)
Total Bilirubin: 1 mg/dL (ref 0.3–1.2)
Total Protein: 7.2 g/dL (ref 6.5–8.1)

## 2019-01-30 LAB — CBC
HCT: 36.9 % — ABNORMAL LOW (ref 39.0–52.0)
Hemoglobin: 11.3 g/dL — ABNORMAL LOW (ref 13.0–17.0)
MCH: 30.5 pg (ref 26.0–34.0)
MCHC: 30.6 g/dL (ref 30.0–36.0)
MCV: 99.7 fL (ref 80.0–100.0)
Platelets: 191 10*3/uL (ref 150–400)
RBC: 3.7 MIL/uL — ABNORMAL LOW (ref 4.22–5.81)
RDW: 18.4 % — ABNORMAL HIGH (ref 11.5–15.5)
WBC: 11.4 10*3/uL — ABNORMAL HIGH (ref 4.0–10.5)
nRBC: 0 % (ref 0.0–0.2)

## 2019-01-30 LAB — LACTIC ACID, PLASMA: Lactic Acid, Venous: 1.5 mmol/L (ref 0.5–1.9)

## 2019-01-30 LAB — TROPONIN I: Troponin I: 0.07 ng/mL (ref <0.03)

## 2019-01-30 MED ORDER — CARVEDILOL 6.25 MG PO TABS: 6.2500 mg | ORAL_TABLET | Freq: Two times a day (BID) | ORAL | Status: DC

## 2019-01-30 MED ORDER — SENNOSIDES-DOCUSATE SODIUM 8.6-50 MG PO TABS
2.0000 | ORAL_TABLET | Freq: Every day | ORAL | Status: DC | PRN
  Filled 2019-01-30: qty 2

## 2019-01-30 MED ORDER — FUROSEMIDE 10 MG/ML IJ SOLN
40.0000 mg | Freq: Two times a day (BID) | INTRAMUSCULAR | Status: DC
  Administered 2019-01-31 – 2019-02-03 (×7): 40 mg via INTRAVENOUS
  Filled 2019-01-30 (×7): qty 4

## 2019-01-30 MED ORDER — ASPIRIN EC 81 MG PO TBEC: 81.0000 mg | DELAYED_RELEASE_TABLET | Freq: Every day | ORAL | Status: DC

## 2019-01-30 MED ORDER — TRAZODONE HCL 50 MG PO TABS
50.0000 mg | ORAL_TABLET | Freq: Every day | ORAL | Status: DC
  Administered 2019-01-30 – 2019-02-02 (×4): 50 mg via ORAL
  Filled 2019-01-30 (×4): qty 1

## 2019-01-30 MED ORDER — SODIUM CHLORIDE 0.9% FLUSH
3.0000 mL | Freq: Two times a day (BID) | INTRAVENOUS | Status: DC
  Administered 2019-01-30 – 2019-02-03 (×8): 3 mL via INTRAVENOUS

## 2019-01-30 MED ORDER — LORATADINE 10 MG PO TABS: 10.0000 mg | ORAL_TABLET | ORAL | Status: DC | PRN

## 2019-01-30 MED ORDER — GABAPENTIN 300 MG PO CAPS
300.0000 mg | ORAL_CAPSULE | Freq: Every day | ORAL | Status: DC
  Administered 2019-01-30 – 2019-02-02 (×4): 300 mg via ORAL
  Filled 2019-01-30 (×4): qty 1

## 2019-01-30 MED ORDER — FUROSEMIDE 10 MG/ML IJ SOLN
40.0000 mg | Freq: Once | INTRAMUSCULAR | Status: AC
  Administered 2019-01-30: 40 mg via INTRAVENOUS
  Filled 2019-01-30: qty 4

## 2019-01-30 MED ORDER — PANTOPRAZOLE SODIUM 40 MG PO TBEC
40.0000 mg | DELAYED_RELEASE_TABLET | ORAL | Status: DC
  Administered 2019-01-31 – 2019-02-03 (×4): 40 mg via ORAL
  Filled 2019-01-30 (×4): qty 1

## 2019-01-30 MED ORDER — VITAMIN D3 25 MCG (1000 UNIT) PO TABS
1000.0000 [IU] | ORAL_TABLET | Freq: Every day | ORAL | Status: DC
  Administered 2019-02-01 – 2019-02-03 (×3): 1000 [IU] via ORAL
  Filled 2019-01-30 (×5): qty 1

## 2019-01-30 MED ORDER — FOLIC ACID 1 MG PO TABS
500.0000 ug | ORAL_TABLET | Freq: Every day | ORAL | Status: DC
  Administered 2019-02-01 – 2019-02-03 (×3): 0.5 mg via ORAL
  Filled 2019-01-30 (×3): qty 1

## 2019-01-30 MED ORDER — ACETAMINOPHEN 325 MG PO TABS: 650.0000 mg | ORAL_TABLET | ORAL | Status: DC | PRN

## 2019-01-30 MED ORDER — ENOXAPARIN SODIUM 40 MG/0.4ML ~~LOC~~ SOLN
40.0000 mg | Freq: Two times a day (BID) | SUBCUTANEOUS | Status: DC
  Administered 2019-01-30: 40 mg via SUBCUTANEOUS
  Filled 2019-01-30: qty 0.4

## 2019-01-30 MED ORDER — SODIUM CHLORIDE 0.9% FLUSH: 3.0000 mL | INTRAVENOUS | Status: DC | PRN

## 2019-01-30 MED ORDER — UMECLIDINIUM-VILANTEROL 62.5-25 MCG/INH IN AEPB
1.0000 | INHALATION_SPRAY | RESPIRATORY_TRACT | Status: DC | PRN
  Filled 2019-01-30: qty 14

## 2019-01-30 MED ORDER — ADULT MULTIVITAMIN W/MINERALS CH
1.0000 | ORAL_TABLET | Freq: Every day | ORAL | Status: DC
  Administered 2019-02-01 – 2019-02-03 (×3): 1 via ORAL
  Filled 2019-01-30 (×3): qty 1

## 2019-01-30 MED ORDER — CLOPIDOGREL BISULFATE 75 MG PO TABS: 75.0000 mg | ORAL_TABLET | Freq: Every day | ORAL | Status: DC

## 2019-01-30 MED ORDER — POLYETHYLENE GLYCOL 3350 17 G PO PACK
17.0000 g | PACK | Freq: Two times a day (BID) | ORAL | Status: DC | PRN
  Administered 2019-02-01: 17 g via ORAL
  Filled 2019-01-30: qty 1

## 2019-01-30 MED ORDER — SODIUM CHLORIDE 0.9 % IV SOLN
1.0000 g | Freq: Once | INTRAVENOUS | Status: AC
  Administered 2019-01-30: 1 g via INTRAVENOUS
  Filled 2019-01-30: qty 1

## 2019-01-30 MED ORDER — QUETIAPINE FUMARATE 25 MG PO TABS: 50.0000 mg | ORAL_TABLET | Freq: Every evening | ORAL | Status: DC | PRN

## 2019-01-30 MED ORDER — MAGNESIUM HYDROXIDE 400 MG/5ML PO SUSP: 30.0000 mL | Freq: Every day | ORAL | Status: DC | PRN

## 2019-01-30 MED ORDER — SPIRONOLACTONE 25 MG PO TABS
12.5000 mg | ORAL_TABLET | Freq: Every day | ORAL | Status: DC
  Administered 2019-02-01 – 2019-02-03 (×3): 12.5 mg via ORAL
  Filled 2019-01-30 (×2): qty 0.5
  Filled 2019-01-30: qty 1
  Filled 2019-01-30 (×2): qty 0.5
  Filled 2019-01-30 (×2): qty 1

## 2019-01-30 MED ORDER — VITAMIN C 500 MG PO TABS
500.0000 mg | ORAL_TABLET | Freq: Every day | ORAL | Status: DC
  Administered 2019-02-01 – 2019-02-03 (×3): 500 mg via ORAL
  Filled 2019-01-30 (×3): qty 1

## 2019-01-30 MED ORDER — ACETAMINOPHEN 500 MG PO TABS
1000.0000 mg | ORAL_TABLET | Freq: Two times a day (BID) | ORAL | Status: DC | PRN
  Administered 2019-02-01: 1000 mg via ORAL
  Filled 2019-01-30: qty 2

## 2019-01-30 MED ORDER — ONDANSETRON HCL 4 MG/2ML IJ SOLN: 4.0000 mg | Freq: Four times a day (QID) | INTRAMUSCULAR | Status: DC | PRN

## 2019-01-30 MED ORDER — ATORVASTATIN CALCIUM 20 MG PO TABS
80.0000 mg | ORAL_TABLET | Freq: Every day | ORAL | Status: DC
  Administered 2019-01-30 – 2019-02-02 (×4): 80 mg via ORAL
  Filled 2019-01-30 (×4): qty 4

## 2019-01-30 MED ORDER — SODIUM CHLORIDE 0.9 % IV SOLN
1.0000 g | Freq: Three times a day (TID) | INTRAVENOUS | Status: DC
  Administered 2019-01-31 – 2019-02-02 (×6): 1 g via INTRAVENOUS
  Filled 2019-01-30 (×10): qty 1

## 2019-01-30 MED ORDER — NITROGLYCERIN 0.4 MG SL SUBL: 0.4000 mg | SUBLINGUAL_TABLET | SUBLINGUAL | Status: DC | PRN

## 2019-01-30 MED ORDER — ISOSORBIDE MONONITRATE ER 60 MG PO TB24: 30.0000 mg | ORAL_TABLET | Freq: Every day | ORAL | Status: DC

## 2019-01-30 MED ORDER — POTASSIUM CHLORIDE CRYS ER 20 MEQ PO TBCR
20.0000 meq | EXTENDED_RELEASE_TABLET | Freq: Every day | ORAL | Status: DC
  Administered 2019-01-31 – 2019-02-03 (×4): 20 meq via ORAL
  Filled 2019-01-30 (×4): qty 1

## 2019-01-30 MED ORDER — TORSEMIDE 10 MG PO TABS
10.0000 mg | ORAL_TABLET | Freq: Two times a day (BID) | ORAL | Status: DC
  Administered 2019-01-30: 10 mg via ORAL
  Filled 2019-01-30 (×3): qty 1

## 2019-01-30 MED ORDER — TAMSULOSIN HCL 0.4 MG PO CAPS
0.4000 mg | ORAL_CAPSULE | Freq: Every evening | ORAL | Status: DC
  Administered 2019-01-30 – 2019-02-02 (×4): 0.4 mg via ORAL
  Filled 2019-01-30 (×4): qty 1

## 2019-01-30 MED ORDER — SODIUM CHLORIDE 0.9 % IV SOLN
250.0000 mL | INTRAVENOUS | Status: DC | PRN
  Administered 2019-01-31 – 2019-02-01 (×2): 250 mL via INTRAVENOUS

## 2019-01-30 MED ORDER — ALUM & MAG HYDROXIDE-SIMETH 200-200-20 MG/5ML PO SUSP: 30.0000 mL | ORAL | Status: DC | PRN

## 2019-01-30 MED ORDER — IPRATROPIUM-ALBUTEROL 0.5-2.5 (3) MG/3ML IN SOLN: 3.0000 mL | Freq: Four times a day (QID) | RESPIRATORY_TRACT | Status: DC | PRN

## 2019-01-30 MED ORDER — SACUBITRIL-VALSARTAN 49-51 MG PO TABS
1.0000 | ORAL_TABLET | Freq: Two times a day (BID) | ORAL | Status: DC
  Administered 2019-01-30: 1 via ORAL
  Filled 2019-01-30 (×3): qty 1

## 2019-01-30 NOTE — ED Notes (Signed)
Pt transport to room 253 by this tech

## 2019-01-30 NOTE — ED Notes (Signed)
Pt changed and wiped by this RN and Lorin Picket, NT. Pt attempted to have a BM on bedpan but was unable to. Condom cath placed on pt.

## 2019-01-30 NOTE — Progress Notes (Signed)
Pharmacy Antibiotic Note  Dylan [name] is a 83 y.o. male admitted on 01/30/2019 with weakness.  Pharmacy has been consulted for meropenem dosing for ESBL infection.  Plan: Meropenem 1 g IV q8h  Height: 5\' 10"  (177.8 cm) Weight: 279 lb 15.8 oz (127 kg) IBW/kg (Calculated) : 73  Temp (24hrs), Avg:98.3 F (36.8 C), Min:98.3 F (36.8 C), Max:98.3 F (36.8 C)  Recent Labs  Lab 01/30/19 1404  WBC 10.7*  CREATININE 1.15  LATICACIDVEN 1.5    Estimated Creatinine Clearance: 67.4 mL/min (by C-G formula based on SCr of 1.15 mg/dL).    No Known Allergies  Antimicrobials this admission: Meropenem 5/30 >>  Dose adjustments this admission:   Microbiology results: 5/30 UCx: pending 5/30 SARS-CoV-2: pending    Thank you for allowing pharmacy to be a part of this patient's care.  Pricilla Riffle, PharmD Pharmacy Resident  01/30/2019 5:10 PM

## 2019-01-30 NOTE — ED Notes (Signed)
Pt cleaned and new sheets and gown upon arrival. Incontinent of urine.

## 2019-01-30 NOTE — ED Notes (Signed)
Pt urinated in bed again and this RN and Gearldine Bienenstock, RN cleaned and changed pt.

## 2019-01-30 NOTE — Progress Notes (Signed)
Care Alignment Note  Advanced Directives Documents (Living Will, Power of Attorney) currently in the EHR no advanced directives documents available .  Has the patient discussed their wishes with their family/healthcare power of attorney no.  What does the patient/decision maker understand about their medical condition and the natural course of their disease.  Acute on chronic systolic CHF exacerbation.  UTI.  Acute metabolic encephalopathy.  Hypertension.  What is the patient/decision maker's biggest fear or concern for the future pain and suffering   What is the most important goal for this patient should their health condition worsen maintenance of function.  Current   Code Status: Full Code  Current code status has been reviewed/updated.  Time spent:20 minutes

## 2019-01-30 NOTE — ED Notes (Signed)
ED TO INPATIENT HANDOFF REPORT  ED Nurse Name and Phone #: Shanda Bumps [phone]  S Name/Age/Gender Dylan [name] 83 y.o. male Room/Bed: ED02A/ED02A  Code Status   Code Status: Full Code  Home/SNF/Other Home Patient oriented to: self, place and situation Is this baseline? Yes   Triage Complete: Triage complete  Chief Complaint Generalized Weakness  Triage Note Pt comes from home via EMS after pts family has reported increased weakness and sleeping for a few days. Pt recently seen for infection in his right toe and is currently on antibiotics for it. Pt has some rhonchi in left lobe per EMS. Pt on 2L  at home normally.    Allergies No Known Allergies  Level of Care/Admitting Diagnosis ED Disposition    ED Disposition Condition Comment   Admit  Hospital Area: [facility] [[id number]]  Level of Care: Telemetry [5]  Covid Evaluation: Confirmed COVID Negative  Diagnosis: Acute on chronic systolic CHF (congestive heart failure) [facility]) [[id number]]  Admitting Physician: Jama Flavors [[id number]]  Attending Physician: Jama Flavors [[id number]]  Estimated length of stay: past midnight tomorrow  Certification:: I certify this patient will need inpatient services for at least 2 midnights  PT Class (Do Not Modify): Inpatient [101]  PT Acc Code (Do Not Modify): Private [1]       B Medical/Surgery History Past Medical History:  Diagnosis Date  . CAD (coronary artery disease)   . Cervicalgia   . CHF (congestive heart failure) (HCC)   . COPD (chronic obstructive pulmonary disease) (HCC)   . Diastolic heart failure (HCC)   . Foot drop, right   . History of kidney stones   . Hyperlipidemia    unspecified  . Hypertension   . Myocardial infarction (HCC)   . Osteoarthritis   . Shoulder pain, left   . Sleep apnea   . Tremor, essential    Past Surgical History:  Procedure Laterality Date  . APPLICATION OF WOUND VAC Right 11/13/2017   Procedure: APPLICATION OF WOUND VAC;  Surgeon:  Annice Needy, MD;  Location: [facility] ORS;  Service: Vascular;  Laterality: Right;  . BACK SURGERY  06/2010  . CARDIAC CATHETERIZATION Left 04/30/2016   Procedure: Left Heart Cath and Coronary Angiography;  Surgeon: Laurier Nancy, MD;  Location: [facility] INVASIVE CV LAB;  Service: Cardiovascular;  Laterality: Left;  . COLONOSCOPY    . CORONARY ANGIOPLASTY    . CORONARY/GRAFT ACUTE MI REVASCULARIZATION N/A 06/21/2018   Procedure: Coronary/Graft Acute MI Revascularization;  Surgeon: Iran Ouch, MD;  Location: [facility] INVASIVE CV LAB;  Service: Cardiovascular;  Laterality: N/A;  . KNEE ARTHROPLASTY Left 06/01/2018   Procedure: COMPUTER ASSISTED TOTAL KNEE ARTHROPLASTY;  Surgeon: Donato Heinz, MD;  Location: [facility] ORS;  Service: Orthopedics;  Laterality: Left;  . KNEE ARTHROSCOPY Left   . KNEE SURGERY Left 1998  . LEFT HEART CATH AND CORONARY ANGIOGRAPHY N/A 06/21/2018   Procedure: LEFT HEART CATH AND CORONARY ANGIOGRAPHY;  Surgeon: Iran Ouch, MD;  Location: [facility] INVASIVE CV LAB;  Service: Cardiovascular;  Laterality: N/A;  . ORIF FEMUR FRACTURE Left 08/27/2018   Procedure: OPEN REDUCTION INTERNAL FIXATION (ORIF) SUPRACONDYLAR FEMUR FRACTURE ABOVE PROSTHESIS, QUADRICEPS REPAIR;  Surgeon: Kennedy Bucker, MD;  Location: [facility] ORS;  Service: Orthopedics;  Laterality: Left;  . TEE WITHOUT CARDIOVERSION N/A 11/18/2018   Procedure: TRANSESOPHAGEAL ECHOCARDIOGRAM (TEE);  Surgeon: Lamar Blinks, MD;  Location: [facility] ORS;  Service: Cardiovascular;  Laterality: N/A;  . TONSILLECTOMY    . WOUND DEBRIDEMENT Right  11/13/2017   Procedure: DEBRIDEMENT WOUND;  Surgeon: Annice Needyew, Jason S, MD;  Location: [facility] ORS;  Service: Vascular;  Laterality: Right;     A IV Location/Drains/Wounds Patient Lines/Drains/Airways Status   Active Line/Drains/Airways    Name:   Placement date:   Placement time:   Site:   Days:   Peripheral IV 01/30/19 Right Hand   01/30/19    1539    Hand   less than 1   Negative Pressure Wound  Therapy Leg Left   08/27/18    1438    -   156   Incision (Closed) 06/01/18 Knee Left   06/01/18    1555     243   Incision (Closed) 08/27/18 Leg Left   08/27/18    1501     156   Pressure Injury 11/15/18   11/15/18    1530     76   Wound / Incision (Open or Dehisced) 11/27/18 Other (Comment) Buttocks Left;Posterior   11/27/18    2000    Buttocks   64   Wound / Incision (Open or Dehisced) 11/30/18 Back Lateral;Left thorancentsis   11/30/18    1109    Back   61          Intake/Output Last 24 hours No intake or output data in the 24 hours ending 01/30/19 1813  Labs/Imaging Results for orders placed or performed during the hospital encounter of 01/30/19 (from the past 48 hour(s))  Comprehensive metabolic panel     Status: Abnormal   Collection Time: 01/30/19  2:04 PM  Result Value Ref Range   Sodium 144 135 - 145 mmol/L   Potassium 4.6 3.5 - 5.1 mmol/L   Chloride 99 98 - 111 mmol/L   CO2 36 (H) 22 - 32 mmol/L   Glucose, Bld 168 (H) 70 - 99 mg/dL   BUN 39 (H) 8 - 23 mg/dL   Creatinine, Ser 1.611.15 0.61 - 1.24 mg/dL   Calcium 9.0 8.9 - 09.610.3 mg/dL   Total Protein 7.2 6.5 - 8.1 g/dL   Albumin 3.1 (L) 3.5 - 5.0 g/dL   AST 49 (H) 15 - 41 U/L   ALT 50 (H) 0 - 44 U/L   Alkaline Phosphatase 151 (H) 38 - 126 U/L   Total Bilirubin 1.0 0.3 - 1.2 mg/dL   GFR calc non Af Amer 59 (L) >60 mL/min   GFR calc Af Amer >60 >60 mL/min   Anion gap 9 5 - 15    Comment: Performed at [facility], [address] [place], [place] [address]  Lactic acid, plasma     Status: None   Collection Time: 01/30/19  2:04 PM  Result Value Ref Range   Lactic Acid, Venous 1.5 0.5 - 1.9 mmol/L    Comment: Performed at [facility], [address] [place], [place] [address]  CBC with Differential     Status: Abnormal   Collection Time: 01/30/19  2:04 PM  Result Value Ref Range   WBC 10.7 (H) 4.0 - 10.5 K/uL   RBC 4.05 (L) 4.22 - 5.81 MIL/uL   Hemoglobin 12.2 (L) 13.0 - 17.0 g/dL   HCT 14.740.9 82.939.0  - 56.252.0 %   MCV 101.0 (H) 80.0 - 100.0 fL   MCH 30.1 26.0 - 34.0 pg   MCHC 29.8 (L) 30.0 - 36.0 g/dL   RDW 13.018.7 (H) 86.511.5 - 78.415.5 %   Platelets 217 150 - 400 K/uL   nRBC 0.0 0.0 -  0.2 %   Neutrophils Relative % 68 %   Neutro Abs 7.3 1.7 - 7.7 K/uL   Lymphocytes Relative 19 %   Lymphs Abs 2.1 0.7 - 4.0 K/uL   Monocytes Relative 9 %   Monocytes Absolute 1.0 0.1 - 1.0 K/uL   Eosinophils Relative 2 %   Eosinophils Absolute 0.2 0.0 - 0.5 K/uL   Basophils Relative 1 %   Basophils Absolute 0.1 0.0 - 0.1 K/uL   Immature Granulocytes 1 %   Abs Immature Granulocytes 0.10 (H) 0.00 - 0.07 K/uL    Comment: Performed at [facility], [address] [place], [place] [address]  Protime-INR     Status: None   Collection Time: 01/30/19  2:04 PM  Result Value Ref Range   Prothrombin Time 13.9 11.4 - 15.2 seconds   INR 1.1 0.8 - 1.2    Comment: (NOTE) INR goal varies based on device and disease states. Performed at [facility], [address] [place], [place] [address]   Urinalysis, Complete w Microscopic     Status: Abnormal   Collection Time: 01/30/19  2:04 PM  Result Value Ref Range   Color, Urine AMBER (A) YELLOW    Comment: BIOCHEMICALS MAY BE AFFECTED BY COLOR   APPearance HAZY (A) CLEAR   Specific Gravity, Urine 1.018 1.005 - 1.030   pH 5.0 5.0 - 8.0   Glucose, UA NEGATIVE NEGATIVE mg/dL   Hgb urine dipstick NEGATIVE NEGATIVE   Bilirubin Urine NEGATIVE NEGATIVE   Ketones, ur NEGATIVE NEGATIVE mg/dL   Protein, ur 952 (A) NEGATIVE mg/dL   Nitrite POSITIVE (A) NEGATIVE   Leukocytes,Ua NEGATIVE NEGATIVE   RBC / HPF 0-5 0 - 5 RBC/hpf   WBC, UA 6-10 0 - 5 WBC/hpf   Bacteria, UA RARE (A) NONE SEEN   Squamous Epithelial / LPF 0-5 0 - 5   WBC Clumps PRESENT    Mucus PRESENT    Hyaline Casts, UA PRESENT     Comment: Performed at [facility], [address], [place], [place] [address]  SARS Coronavirus 2 (CEPHEID - Performed in [facility] hospital lab),  Hosp Order     Status: None   Collection Time: 01/30/19  4:03 PM  Result Value Ref Range   SARS Coronavirus 2 NEGATIVE NEGATIVE    Comment: (NOTE) If result is NEGATIVE SARS-CoV-2 target nucleic acids are NOT DETECTED. The SARS-CoV-2 RNA is generally detectable in upper and lower  respiratory specimens during the acute phase of infection. The lowest  concentration of SARS-CoV-2 viral copies this assay can detect is 250  copies / mL. A negative result does not preclude SARS-CoV-2 infection  and should not be used as the sole basis for treatment or other  patient management decisions.  A negative result may occur with  improper specimen collection / handling, submission of specimen other  than nasopharyngeal swab, presence of viral mutation(s) within the  areas targeted by this assay, and inadequate number of viral copies  (<250 copies / mL). A negative result must be combined with clinical  observations, patient history, and epidemiological information. If result is POSITIVE SARS-CoV-2 target nucleic acids are DETECTED. The SARS-CoV-2 RNA is generally detectable in upper and lower  respiratory specimens dur ing the acute phase of infection.  Positive  results are indicative of active infection with SARS-CoV-2.  Clinical  correlation with patient history and other diagnostic information is  necessary to determine patient infection status.  Positive results do  not  rule out bacterial infection or co-infection with other viruses. If result is PRESUMPTIVE POSTIVE SARS-CoV-2 nucleic acids MAY BE PRESENT.   A presumptive positive result was obtained on the submitted specimen  and confirmed on repeat testing.  While 2019 novel coronavirus  (SARS-CoV-2) nucleic acids may be present in the submitted sample  additional confirmatory testing may be necessary for epidemiological  and / or clinical management purposes  to differentiate between  SARS-CoV-2 and other Sarbecovirus currently known to  infect humans.  If clinically indicated additional testing with an alternate test  methodology [phone]) is advised. The SARS-CoV-2 RNA is generally  detectable in upper and lower respiratory sp ecimens during the acute  phase of infection. The expected result is Negative. Fact Sheet for Patients:  BoilerBrush.com.cy Fact Sheet for Healthcare Providers: https://pope.com/ This test is not yet approved or cleared by the [place] FDA and has been authorized for detection and/or diagnosis of SARS-CoV-2 by FDA under an Emergency Use Authorization (EUA).  This EUA will remain in effect (meaning this test can be used) for the duration of the COVID-19 declaration under Section 564(b)(1) of the Act, 21 U.S.C. section 360bbb-3(b)(1), unless the authorization is terminated or revoked sooner. Performed at [facility], [address] [place], [place] [address]    Dg Chest Port 1 View  Result Date: 01/30/2019 CLINICAL DATA:  Increased weakness.  Sleeping for several days. EXAM: PORTABLE CHEST 1 VIEW COMPARISON:  Radiograph 11/30/2018 FINDINGS: Stable enlarged cardiac silhouette. Low lung volumes. Bilateral pleural effusions. Central venous congestion. No focal consolidation no pneumothorax. IMPRESSION: Findings suggest congestive heart failure with cardiomegaly, pleural effusions and central venous congestion. Electronically Signed   By: Genevive Bi M.D.   On: 01/30/2019 15:11    Pending Labs Unresulted Labs (From admission, onward)    Start     Ordered   01/31/19 0500  Basic metabolic panel  Daily,   STAT     01/30/19 1805   01/31/19 0500  CBC  Tomorrow morning,   STAT     01/30/19 1807   01/31/19 0500  Magnesium  Tomorrow morning,   STAT     01/30/19 1807   01/30/19 1813  Troponin I - Now Then Q6H  Now then every 6 hours,   STAT     01/30/19 1812   01/30/19 1549  Urine Culture  Add-on,   AD     01/30/19 1548           Vitals/Pain Today's Vitals   01/30/19 1351 01/30/19 1545 01/30/19 1630 01/30/19 1730  BP:  (!) 124/93 (!) 132/92 (!) 153/125  Pulse:  73 73 74  Resp:  (!) 33 (!) 31 (!) 26  Temp: 98.3 F (36.8 C)     TempSrc: Oral     SpO2:  100% 97% 93%  Weight:      Height:      PainSc:        Isolation Precautions No active isolations  Medications Medications  meropenem (MERREM) 1 g in sodium chloride 0.9 % 100 mL IVPB (has no administration in time range)  acetaminophen (TYLENOL) tablet 1,000 mg (has no administration in time range)  aspirin EC tablet 81 mg (has no administration in time range)  atorvastatin (LIPITOR) tablet 80 mg (has no administration in time range)  carvedilol (COREG) tablet 6.25 mg (has no administration in time range)  furosemide (LASIX) injection 40 mg (has no administration in time range)  isosorbide mononitrate (IMDUR) 24 hr tablet 30 mg (  has no administration in time range)  nitroGLYCERIN (NITROSTAT) SL tablet 0.3 mg (has no administration in time range)  sacubitril-valsartan (ENTRESTO) 49-51 mg per tablet (has no administration in time range)  spironolactone (ALDACTONE) tablet 12.5 mg (has no administration in time range)  torsemide (DEMADEX) tablet 10 mg (has no administration in time range)  QUEtiapine (SEROQUEL) tablet 50 mg (has no administration in time range)  traZODone (DESYREL) tablet 50 mg (has no administration in time range)  alum & mag hydroxide-simeth (MAALOX PLUS) 400-400-40 MG/5ML suspension 30 mL (has no administration in time range)  magnesium hydroxide (MILK OF MAGNESIA) suspension 30 mL (has no administration in time range)  pantoprazole (PROTONIX) EC tablet 40 mg (has no administration in time range)  polyethylene glycol (MIRALAX / GLYCOLAX) packet 17 g (has no administration in time range)  sennosides-docusate sodium (SENOKOT-S) 8.6-50 MG tablet 2 tablet (has no administration in time range)  tamsulosin (FLOMAX) capsule 0.4 mg (has no  administration in time range)  clopidogrel (PLAVIX) tablet 75 mg (has no administration in time range)  folic acid (FOLVITE) tablet 400 mcg (has no administration in time range)  gabapentin (NEURONTIN) capsule 300 mg (has no administration in time range)  vitamin C (ASCORBIC ACID) tablet 500 mg (has no administration in time range)  Cholecalciferol 1,000 Units (has no administration in time range)  CENTROVITE TABS 1 tablet (has no administration in time range)  ipratropium-albuterol (DUONEB) 0.5-2.5 (3) MG/3ML nebulizer solution 3 mL (has no administration in time range)  potassium chloride SA (K-DUR) CR tablet 20 mEq (has no administration in time range)  loratadine (CLARITIN) tablet 10 mg (has no administration in time range)  umeclidinium-vilanterol (ANORO ELLIPTA) 62.5-25 MCG/INH 1 puff (has no administration in time range)  sodium chloride flush (NS) 0.9 % injection 3 mL (has no administration in time range)  sodium chloride flush (NS) 0.9 % injection 3 mL (has no administration in time range)  0.9 %  sodium chloride infusion (has no administration in time range)  acetaminophen (TYLENOL) tablet 650 mg (has no administration in time range)  ondansetron (ZOFRAN) injection 4 mg (has no administration in time range)  enoxaparin (LOVENOX) injection 40 mg (has no administration in time range)  meropenem (MERREM) 1 g in sodium chloride 0.9 % 100 mL IVPB (0 g Intravenous Stopped 01/30/19 1712)  furosemide (LASIX) injection 40 mg (40 mg Intravenous Given 01/30/19 1600)    Mobility non-ambulatory Low fall risk   Focused Assessments Pt increasingly weak with family and sleeping more than normal; pt has had trouble holding his bowels and urine also;        R Recommendations: See Admitting Provider Note  Report given to:   Additional Notes:

## 2019-01-30 NOTE — H&P (Signed)
[facility]   PATIENT NAME: Dylan [name]    MR#:  [id number]  DATE OF BIRTH:  04-12-1936  DATE OF ADMISSION:  01/30/2019  PRIMARY CARE PHYSICIAN: [name], MD   REQUESTING/REFERRING PHYSICIAN: Sharman Cheek  CHIEF COMPLAINT:   Chief Complaint  Patient presents with  . Weakness    HISTORY OF PRESENT ILLNESS:  Dylan [name]  is a 83 y.o. male with a known history of CAD, chronic systolic CHF, COPD and hypertension was brought into the emergency room with complaints of generalized weakness.  Also complained of shortness of breath.  Patient was also reported to have been confused prior to my evaluation.  At the time of my evaluation patient was awake and alert and oriented.  Denies any chest pain.  No fevers.  Patient was evaluated in the emergency room and found to have evidence of urinary tract infection.  Based on prior history of urinary tract infection with Klebsiella oxytocin resistant to Rocephin but sensitive to meropenem, patient was already started on IV meropenem.  Chest x-ray findings also consistent with CHF.  Patient was given a dose of IV Lasix.  Diagnosed with acute on chronic systolic CHF exacerbation.  Medical service called to admit patient for further evaluation and management.  PAST MEDICAL HISTORY:   Past Medical History:  Diagnosis Date  . CAD (coronary artery disease)   . Cervicalgia   . CHF (congestive heart failure) (HCC)   . COPD (chronic obstructive pulmonary disease) (HCC)   . Diastolic heart failure (HCC)   . Foot drop, right   . History of kidney stones   . Hyperlipidemia    unspecified  . Hypertension   . Myocardial infarction (HCC)   . Osteoarthritis   . Shoulder pain, left   . Sleep apnea   . Tremor, essential     PAST SURGICAL HISTORY:   Past Surgical History:  Procedure Laterality Date  . APPLICATION OF WOUND VAC Right 11/13/2017   Procedure: APPLICATION OF WOUND VAC;  Surgeon: Annice Needy, MD;  Location: [facility] ORS;  Service: Vascular;  Laterality: Right;  . BACK SURGERY  06/2010  . CARDIAC CATHETERIZATION Left 04/30/2016   Procedure: Left Heart Cath and Coronary Angiography;  Surgeon: Laurier Nancy, MD;  Location: [facility];  Service: Cardiovascular;  Laterality: Left;  . COLONOSCOPY    . CORONARY ANGIOPLASTY    . CORONARY/GRAFT ACUTE MI REVASCULARIZATION N/A 06/21/2018   Procedure: Coronary/Graft Acute MI Revascularization;  Surgeon: Iran Ouch, MD;  Location: [facility];  Service: Cardiovascular;  Laterality: N/A;  . KNEE ARTHROPLASTY Left 06/01/2018   Procedure: COMPUTER ASSISTED TOTAL KNEE ARTHROPLASTY;  Surgeon: Donato Heinz, MD;  Location: [facility] ORS;  Service: Orthopedics;  Laterality: Left;  . KNEE ARTHROSCOPY Left   . KNEE SURGERY Left 1998  . LEFT HEART CATH AND CORONARY ANGIOGRAPHY N/A 06/21/2018   Procedure: LEFT HEART CATH AND CORONARY ANGIOGRAPHY;  Surgeon: Iran Ouch, MD;  Location: [facility];  Service: Cardiovascular;  Laterality: N/A;  . ORIF FEMUR FRACTURE Left 08/27/2018   Procedure: OPEN REDUCTION INTERNAL FIXATION (ORIF) SUPRACONDYLAR FEMUR FRACTURE ABOVE PROSTHESIS, QUADRICEPS REPAIR;  Surgeon: Kennedy Bucker, MD;  Location: [facility] OR[facility];  Service: Orthopedics;  Laterality: Left;  . TEE WITHOUT CARDIOVERSION N/A 11/18/2018   Procedure: TRANSESOPHAGEAL ECHOCARDIOGRAM (TEE);  Surgeon: Lamar Blinks, MD;  Location: [facility];  Service: Cardiovascular;  Laterality: N/A;  . TONSILLECTOMY    .  WOUND DEBRIDEMENT Right 11/13/2017   Procedure: DEBRIDEMENT WOUND;  Surgeon: Annice Needy, MD;  Location: [facility] OR[facility];  Service: Vascular;  Laterality: Right;    SOCIAL HISTORY:   Social History   Tobacco Use  . Smoking status: Former Smoker    Packs/day: 0.25    Years: 58.00    Pack years: 14.50    Types: Cigarettes  . Smokeless tobacco: Former Neurosurgeon  . Tobacco comment: quit the begginning of septembet 2019  Substance Use  Topics  . Alcohol use: No    Alcohol/week: 0.0 standard drinks    FAMILY HISTORY:   Family History  Problem Relation Age of Onset  . Diabetes Son   . Cancer Mother   . Colon cancer Mother   . Lung cancer Mother   . Tremor Father   . Heart disease Father   . Tremor Brother   . Bladder Cancer Brother   . Tremor Sister     DRUG ALLERGIES:  No Known Allergies  REVIEW OF SYSTEMS:   Review of Systems  Constitutional: Negative for chills and fever.  HENT: Negative for hearing loss and tinnitus.   Eyes: Negative for blurred vision and double vision.  Respiratory: Positive for shortness of breath. Negative for cough.   Cardiovascular: Negative for chest pain and palpitations.  Gastrointestinal: Negative for heartburn and nausea.  Genitourinary: Negative for urgency.  Musculoskeletal: Negative for myalgias and neck pain.  Skin: Negative for itching and rash.  Neurological: Negative for dizziness and headaches.  Psychiatric/Behavioral: Negative for depression and hallucinations.    MEDICATIONS AT HOME:   Prior to Admission medications   Medication Sig Start Date End Date Taking? Authorizing Provider  amoxicillin-clavulanate (AUGMENTIN) 875-125 MG tablet Take 1 tablet by mouth every 12 (twelve) hours. 01/18/19 02/07/19 Yes [provider]  Ascorbic Acid (VITAMIN C PO) Take 500 mg by mouth daily.    Yes [provider]  aspirin EC 81 MG tablet Take 81 mg by mouth daily.   Yes [provider]  atorvastatin (LIPITOR) 80 MG tablet Take 80 mg by mouth daily.  09/04/18  Yes [provider]  carvedilol (COREG) 6.25 MG tablet Take 6.25 mg by mouth 2 (two) times daily with a meal. 09/07/18  Yes [provider]  Cholecalciferol (VITAMIN D-1000 MAX ST) 25 MCG (1000 UT) tablet Take 1 tablet (1,000 Units total) by mouth daily. 12/24/18  Yes Clarisa Kindred A, FNP  clopidogrel (PLAVIX) 75 MG tablet Take 75 mg by mouth daily.   Yes [provider]   doxycycline (VIBRA-TABS) 100 MG tablet Take 100 mg by mouth 2 (two) times a day. 01/26/19 02/15/19 Yes [provider]  folic acid (V-R FOLIC ACID) 400 MCG tablet Take 1 tablet (400 mcg total) by mouth daily. 12/24/18  Yes Clarisa Kindred A, FNP  furosemide (LASIX) 40 MG tablet Take 40 mg by mouth 2 (two) times daily.   Yes [provider]  gabapentin (NEURONTIN) 300 MG capsule Take 1 capsule (300 mg total) by mouth at bedtime. 12/24/18  Yes Clarisa Kindred A, FNP  isosorbide mononitrate (IMDUR) 30 MG 24 hr tablet Take 30 mg by mouth daily.  05/31/16  Yes [provider]  Multiple Vitamins-Minerals (CENTROVITE) TABS Take 1 tablet by mouth daily.   Yes [provider]  Nicotine 10 MG/ML SOLN Place 1 spray into the nose as needed for smoking cessation. 01/07/19 04/07/19 Yes [provider]  pantoprazole (PROTONIX) 40 MG tablet Take 1 tablet (40 mg total) by  mouth daily. 12/01/18  Yes [name], [name], MD  potassium chloride SA (K-DUR) 20 MEQ tablet Take 1 tablet (20 mEq total) by mouth daily. 12/24/18  Yes Hackney, Inetta Fermo A, FNP  predniSONE (DELTASONE) 20 MG tablet Take 1 tablet (20 mg total) by mouth daily. 1 po daily 12/24/18  Yes Hackney, Tina A, FNP  primidone (MYSOLINE) 50 MG tablet Take 200 mg by mouth 2 (two) times daily.    Yes [provider]  QUEtiapine (SEROQUEL) 50 MG tablet Take 1 tablet (50 mg total) by mouth at bedtime as needed. 12/24/18  Yes Hackney, Tina A, FNP  sacubitril-valsartan (ENTRESTO) 49-51 MG Take 1 tablet by mouth 2 (two) times a day. 01/07/19  Yes [provider]  spironolactone (ALDACTONE) 25 MG tablet Take 12.5 mg by mouth daily.  09/04/18  Yes [provider]  tamsulosin (FLOMAX) 0.4 MG CAPS capsule Take 1 capsule (0.4 mg total) by mouth daily. 09/01/18  Yes Evon Slack, PA-C  torsemide (DEMADEX) 10 MG tablet Take 10 mg by mouth 2 (two) times a day. 12/28/18  Yes [provider]  traZODone (DESYREL) 50 MG  tablet Take 50 mg by mouth at bedtime. 01/07/19 01/07/20 Yes [provider]  acetaminophen (TYLENOL) 500 MG tablet Take 1,000 mg by mouth 2 (two) times daily as needed for moderate pain.    [provider]  alum & mag hydroxide-simeth (MAALOX PLUS) 400-400-40 MG/5ML suspension Take 30 mLs by mouth every 4 (four) hours as needed for indigestion. 09/04/18   [provider]  ipratropium-albuterol (DUONEB) 0.5-2.5 (3) MG/3ML SOLN Take 3 mLs by nebulization every 6 (six) hours as needed (shortness of breath).     [provider]  ketotifen (ZADITOR) 0.025 % ophthalmic solution Place 1 drop into both eyes as needed. 12/24/18   Delma Freeze, FNP  loratadine (CLARITIN) 10 MG tablet Take 1 tablet (10 mg total) by mouth as needed for allergies. 12/24/18   Delma Freeze, FNP  magnesium hydroxide (MILK OF MAGNESIA) 400 MG/5ML suspension Take 30 mLs by mouth daily as needed for mild constipation.     [provider]  nitroGLYCERIN (NITROSTAT) 0.3 MG SL tablet Place 0.3 mg under the tongue every 5 (five) minutes as needed for chest pain.    [provider]  polyethylene glycol (MIRALAX / GLYCOLAX) packet Take 17 g by mouth 2 (two) times daily as needed. Hold for loose stools. 09/29/18   [provider]  sennosides-docusate sodium (SENOKOT-S) 8.6-50 MG tablet Take 2 tablets by mouth daily as needed for constipation. 12/24/18   Clarisa Kindred A, FNP  umeclidinium-vilanterol (ANORO ELLIPTA) 62.5-25 MCG/INH AEPB Inhale 1 puff into the lungs as needed. 12/24/18   Delma Freeze, FNP      VITAL SIGNS:  Blood pressure (!) 153/125, pulse 74, temperature 98.3 F (36.8 C), temperature source Oral, resp. rate (!) 26, height  (1.778 m), weight 127 kg, SpO2 93 %.  PHYSICAL EXAMINATION:  Physical Exam  GENERAL:  83 y.o.-year-old patient lying in the bed with no acute distress.  EYES: Pupils equal, round, reactive to light and accommodation. No scleral  icterus. Extraocular muscles intact.  HEENT: Head atraumatic, normocephalic. Oropharynx and nasopharynx clear.  NECK:  Supple, no jugular venous distention. No thyroid enlargement, no tenderness.  LUNGS: Mild Rales bilaterally.  no wheezing, rales,rhonchi or crepitation. No use of accessory muscles of respiration.  CARDIOVASCULAR: S1, S2 normal. No murmurs, rubs, or gallops.  ABDOMEN: Soft, nontender, nondistended. Bowel sounds  present. No organomegaly or mass.  EXTREMITIES: No pedal edema, cyanosis, or clubbing.  Venous stasis changes on both lower extremity. NEUROLOGIC: Generalized weakness with strength of 4/5 globally.  Sensation intact. Gait not checked.  PSYCHIATRIC: The patient is alert and oriented x 3.  SKIN: No obvious rash, lesion, or ulcer.   LABORATORY PANEL:   CBC Recent Labs  Lab 01/30/19 1404  WBC 10.7*  HGB 12.2*  HCT 40.9  PLT 217   ------------------------------------------------------------------------------------------------------------------  Chemistries  Recent Labs  Lab 01/30/19 1404  NA 144  K 4.6  CL 99  CO2 36*  GLUCOSE 168*  BUN 39*  CREATININE 1.15  CALCIUM 9.0  AST 49*  ALT 50*  ALKPHOS 151*  BILITOT 1.0   ------------------------------------------------------------------------------------------------------------------  Cardiac Enzymes No results for input(s): TROPONINI in the last 168 hours. ------------------------------------------------------------------------------------------------------------------  RADIOLOGY:  Dg Chest Port 1 View  Result Date: 01/30/2019 CLINICAL DATA:  Increased weakness.  Sleeping for several days. EXAM: PORTABLE CHEST 1 VIEW COMPARISON:  Radiograph 11/30/2018 FINDINGS: Stable enlarged cardiac silhouette. Low lung volumes. Bilateral pleural effusions. Central venous congestion. No focal consolidation no pneumothorax. IMPRESSION: Findings suggest congestive heart failure with cardiomegaly, pleural effusions and  central venous congestion. Electronically Signed   By: Genevive BiStewart  Edmunds M.D.   On: 01/30/2019 15:11      IMPRESSION AND PLAN:  Patient is an 83 year old with history of CAD, chronic systolic CHF, COPD and hypertension was brought into the emergency room with complaints of generalized weakness.  Also complained of shortness of breath.  Was diagnosed with urinary tract infection and acute on chronic systolic CHF exacerbation  1.  Acute on chronic systolic CHF exacerbation Patient started on diuresis with IV Lasix 40 mg every 12 hourly.  Continue home dose of Coreg.  Continued Entresto.  Follow-up cardiac enzymes to rule out acute coronary syndrome. Monitor daily weight.  Monitor clinically.  2.  Urinary tract infection. Patient with prior history of UTI with Klebsiella oxytocin which was resistant to Rocephin but sensitive to meropenem Patient already started on IV meropenem in the emergency room which was continued pending results of urine culture and sensitivities.  3.  Acute metabolic encephalopathy Secondary to UTI. Appears resolved at the time of my evaluation.  Patient awake and alert and oriented at this time.  4.  Hypertension Resumed home blood pressure medications.  Monitor.  5.  Prior history of total hip replacement surgery. Patient reports being mostly bedbound since surgery.  DVT prophylaxis; Lovenox    All the records are reviewed and case discussed with ED provider. Management plans discussed with the patient, and he is in agreement.  CODE STATUS: Full code  TOTAL TIME TAKING CARE OF THIS PATIENT: 61 minutes.    Cypress Fanfan M.D on 01/30/2019 at 6:07 PM  Between 7am to 6pm - Pager - [phone]  After 6pm go to [email or web address] - Social research officer, [email or web address] [email or web address]  [facility]  Office  [phone]  CC: Primary care physician; Jaclyn Shaggyate, [name], MD   Note: This dictation was prepared with Dragon dictation along with smaller phrase technology. Any  transcriptional errors that result from this process are unintentional.

## 2019-01-30 NOTE — ED Notes (Signed)
Admitting at bedside 

## 2019-01-30 NOTE — ED Provider Notes (Addendum)
[facility] Emergency Department Provider Note  ____________________________________________  Time seen: Approximately 3:53 PM  I have reviewed the triage vital signs and the nursing notes.   HISTORY  Chief Complaint Weakness    Level 5 Caveat: Portions of the History and Physical including HPI and review of systems are unable to be completely obtained due to patient being a poor historian   HPI Dylan [name] is a 83 y.o. male with a history of CAD CHF COPD hypertension who comes to the ED due to generalized weakness, somnolence, confusion for the past 2 days.  Also reports being somewhat short of breath at home.  Denies any pain anywhere or falls.  No head injuries.  Reports compliance with his medication.      Past Medical History:  Diagnosis Date  . CAD (coronary artery disease)   . Cervicalgia   . CHF (congestive heart failure) (HCC)   . COPD (chronic obstructive pulmonary disease) (HCC)   . Diastolic heart failure (HCC)   . Foot drop, right   . History of kidney stones   . Hyperlipidemia    unspecified  . Hypertension   . Myocardial infarction (HCC)   . Osteoarthritis   . Shoulder pain, left   . Sleep apnea   . Tremor, essential      Patient Active Problem List   Diagnosis Date Noted  . Acute respiratory failure with hypoxemia (HCC) 11/23/2018  . Goals of care, counseling/discussion   . Palliative care by specialist   . DNR (do not resuscitate) discussion   . Acute CHF (congestive heart failure) (HCC) 11/14/2018  . Acute on chronic systolic CHF (congestive heart failure) (HCC) 10/27/2018  . HCAP (healthcare-associated pneumonia) 10/27/2018  . Hypokalemia 10/01/2018  . Hypertensive heart and kidney disease with acute on chronic diastolic congestive heart failure and stage 3 chronic kidney disease (HCC) 09/09/2018  . Chronic constipation 09/09/2018  . BPH with obstruction/lower urinary tract symptoms 09/09/2018  . Obstructive sleep  apnea of adult 09/09/2018  . Idiopathic peripheral neuropathy 09/09/2018  . Chronic kidney disease, stage 3, mod decreased GFR (HCC) 09/09/2018  . Anemia of chronic renal failure, stage 3 (moderate) (HCC) 09/09/2018  . Periprosthetic fracture around internal prosthetic left knee joint 08/26/2018  . Acute ST elevation myocardial infarction (STEMI) of inferior wall (HCC) 06/21/2018  . S/P total knee arthroplasty 06/01/2018  . Primary osteoarthritis of left knee 02/05/2018  . Sepsis (HCC) 01/10/2018  . Community acquired pneumonia 12/01/2017  . Pneumonia 12/01/2017  . Hyperlipidemia 09/23/2017  . Weakness 08/31/2017  . Benign essential tremor 10/18/2016  . Class 3 severe obesity due to excess calories with serious comorbidity and body mass index (BMI) of 45.0 to 49.9 in adult (HCC) 10/18/2016  . Varicose veins of lower extremities with ulcer (HCC) 06/17/2016  . Chronic venous insufficiency 06/17/2016  . Lymphedema 06/17/2016  . Acute on chronic diastolic heart failure (HCC) 06/10/2016  . COPD, severe (HCC) 05/24/2016  . AV block, Mobitz 1 02/09/2016  . Coronary atherosclerosis of native coronary artery 02/09/2016  . ST elevation MI (STEMI) (HCC) 02/08/2016  . Right foot drop 04/07/2014  . Rotator cuff rupture, complete 04/22/2013  . Neck pain 04/14/2013     Past Surgical History:  Procedure Laterality Date  . APPLICATION OF WOUND VAC Right 11/13/2017   Procedure: APPLICATION OF WOUND VAC;  Surgeon: Annice Needy, MD;  Location: [facility] ORS;  Service: Vascular;  Laterality: Right;  . BACK SURGERY  06/2010  . CARDIAC CATHETERIZATION  Left 04/30/2016   Procedure: Left Heart Cath and Coronary Angiography;  Surgeon: Laurier NancyShaukat A Khan, MD;  Location: [facility];  Service: Cardiovascular;  Laterality: Left;  . COLONOSCOPY    . CORONARY ANGIOPLASTY    . CORONARY/GRAFT ACUTE MI REVASCULARIZATION N/A 06/21/2018   Procedure: Coronary/Graft Acute MI Revascularization;  Surgeon: Iran OuchArida,  Muhammad A, MD;  Location: [facility];  Service: Cardiovascular;  Laterality: N/A;  . KNEE ARTHROPLASTY Left 06/01/2018   Procedure: COMPUTER ASSISTED TOTAL KNEE ARTHROPLASTY;  Surgeon: Donato HeinzHooten, James P, MD;  Location: [facility] ORS;  Service: Orthopedics;  Laterality: Left;  . KNEE ARTHROSCOPY Left   . KNEE SURGERY Left 1998  . LEFT HEART CATH AND CORONARY ANGIOGRAPHY N/A 06/21/2018   Procedure: LEFT HEART CATH AND CORONARY ANGIOGRAPHY;  Surgeon: Iran OuchArida, Muhammad A, MD;  Location: [facility];  Service: Cardiovascular;  Laterality: N/A;  . ORIF FEMUR FRACTURE Left 08/27/2018   Procedure: OPEN REDUCTION INTERNAL FIXATION (ORIF) SUPRACONDYLAR FEMUR FRACTURE ABOVE PROSTHESIS, QUADRICEPS REPAIR;  Surgeon: Kennedy BuckerMenz, Michael, MD;  Location: [facility];  Service: Orthopedics;  Laterality: Left;  . TEE WITHOUT CARDIOVERSION N/A 11/18/2018   Procedure: TRANSESOPHAGEAL ECHOCARDIOGRAM (TEE);  Surgeon: Lamar BlinksKowalski, Bruce J, MD;  Location: [facility] OR[facility];  Service: Cardiovascular;  Laterality: N/A;  . TONSILLECTOMY    . WOUND DEBRIDEMENT Right 11/13/2017   Procedure: DEBRIDEMENT WOUND;  Surgeon: Annice Needyew, Jason S, MD;  Location: [facility] OR[facility];  Service: Vascular;  Laterality: Right;     Prior to Admission medications   Medication Sig Start Date End Date Taking? Authorizing Provider  acetaminophen (TYLENOL) 500 MG tablet Take 1,000 mg by mouth 2 (two) times daily as needed for moderate pain.    [provider]  alum & mag hydroxide-simeth (MAALOX PLUS) 400-400-40 MG/5ML suspension Take 30 mLs by mouth every 4 (four) hours as needed for indigestion. 09/04/18   [provider]  Ascorbic Acid (VITAMIN C PO) Take 500 mg by mouth daily.     [provider]  aspirin EC 81 MG tablet Take 81 mg by mouth daily.    [provider]  atorvastatin (LIPITOR) 80 MG tablet Take 80 mg by mouth daily.  09/04/18   [provider]  carvedilol (COREG) 6.25 MG tablet Take 6.25 mg by mouth 2 (two) times  daily with a meal. 09/07/18   [provider]  Cholecalciferol (VITAMIN D-1000 MAX ST) 25 MCG (1000 UT) tablet Take 1 tablet (1,000 Units total) by mouth daily. 12/24/18   Delma FreezeHackney, Tina A, FNP  clopidogrel (PLAVIX) 75 MG tablet Take 75 mg by mouth daily.    [provider]  folic acid (V-R FOLIC ACID) 400 MCG tablet Take 1 tablet (400 mcg total) by mouth daily. 12/24/18   Delma FreezeHackney, Tina A, FNP  furosemide (LASIX) 40 MG tablet Take 40 mg by mouth 2 (two) times daily.    [provider]  gabapentin (NEURONTIN) 300 MG capsule Take 1 capsule (300 mg total) by mouth at bedtime. 12/24/18   Clarisa KindredHackney, Tina A, FNP  ipratropium-albuterol (DUONEB) 0.5-2.5 (3) MG/3ML SOLN Take 3 mLs by nebulization every 6 (six) hours as needed (shortness of breath).     [provider]  isosorbide mononitrate (IMDUR) 30 MG 24 hr tablet Take 30 mg by mouth daily.  05/31/16   [provider]  ketotifen (ZADITOR) 0.025 % ophthalmic solution Place 1 drop into both eyes as needed. 12/24/18   Delma FreezeHackney, Tina A, FNP  loratadine (CLARITIN) 10 MG tablet Take 1  tablet (10 mg total) by mouth as needed for allergies. 12/24/18   Delma Freeze, FNP  magnesium hydroxide (MILK OF MAGNESIA) 400 MG/5ML suspension Take 30 mLs by mouth daily as needed for mild constipation.     [provider]  Multiple Vitamins-Minerals (CENTROVITE) TABS Take 1 tablet by mouth daily.    [provider]  nitroGLYCERIN (NITROSTAT) 0.3 MG SL tablet Place 0.3 mg under the tongue every 5 (five) minutes as needed for chest pain.    [provider]  pantoprazole (PROTONIX) 40 MG tablet Take 1 tablet (40 mg total) by mouth daily. 12/01/18   [name], [name], MD  polyethylene glycol (MIRALAX / GLYCOLAX) packet Take 17 g by mouth 2 (two) times daily as needed. Hold for loose stools. 09/29/18   [provider]  potassium chloride SA (K-DUR) 20 MEQ tablet Take 1 tablet (20 mEq total) by mouth daily. 12/24/18    Delma Freeze, FNP  predniSONE (DELTASONE) 20 MG tablet Take 1 tablet (20 mg total) by mouth daily. 1 po daily 12/24/18   Clarisa Kindred A, FNP  primidone (MYSOLINE) 50 MG tablet Take 200 mg by mouth 2 (two) times daily.     [provider]  QUEtiapine (SEROQUEL) 50 MG tablet Take 1 tablet (50 mg total) by mouth at bedtime as needed. 12/24/18   Delma Freeze, FNP  sacubitril-valsartan (ENTRESTO) 24-26 MG Take 1 tablet by mouth 2 (two) times daily.    [provider]  sennosides-docusate sodium (SENOKOT-S) 8.6-50 MG tablet Take 2 tablets by mouth daily as needed for constipation. 12/24/18   Delma Freeze, FNP  spironolactone (ALDACTONE) 25 MG tablet Take 12.5 mg by mouth daily.  09/04/18   [provider]  tamsulosin (FLOMAX) 0.4 MG CAPS capsule Take 1 capsule (0.4 mg total) by mouth daily. 09/01/18   Evon Slack, PA-C  umeclidinium-vilanterol (ANORO ELLIPTA) 62.5-25 MCG/INH AEPB Inhale 1 puff into the lungs as needed. 12/24/18   Delma Freeze, FNP     Allergies Patient has no known allergies.   Family History  Problem Relation Age of Onset  . Diabetes Son   . Cancer Mother   . Colon cancer Mother   . Lung cancer Mother   . Tremor Father   . Heart disease Father   . Tremor Brother   . Bladder Cancer Brother   . Tremor Sister     Social History Social History   Tobacco Use  . Smoking status: Former Smoker    Packs/day: 0.25    Years: 58.00    Pack years: 14.50    Types: Cigarettes  . Smokeless tobacco: Former Neurosurgeon  . Tobacco comment: quit the begginning of septembet 2019  Substance Use Topics  . Alcohol use: No    Alcohol/week: 0.0 standard drinks  . Drug use: No    Review of Systems Level 5 Caveat: Portions of the History and Physical including HPI and review of systems are unable to be completely obtained due to patient being a poor historian   Constitutional:   No known fever.  ENT:   No rhinorrhea. Cardiovascular:   No chest pain  or syncope. Respiratory:   Positive shortness of breath without  cough. Gastrointestinal:   Negative for abdominal pain, vomiting and diarrhea.  Musculoskeletal:   Negative for focal pain or swelling ____________________________________________   PHYSICAL EXAM:  VITAL SIGNS: ED Triage Vitals  Enc Vitals Group     BP 01/30/19 1348 (!) 149/85  Pulse Rate 01/30/19 1348 74     Resp 01/30/19 1348 (!) 25     Temp 01/30/19 1351 98.3 F (36.8 C)     Temp Source 01/30/19 1351 Oral     SpO2 01/30/19 1348 96 %     Weight 01/30/19 1348 279 lb 15.8 oz (127 kg)     Height 01/30/19 1348  (1.778 m)     Head Circumference --      Peak Flow --      Pain Score 01/30/19 1348 0     Pain Loc --      Pain Edu? --      Excl. in GC? --     Vital signs reviewed, nursing assessments reviewed.   Constitutional:   Alert and to person and place.  Ill-appearing. Eyes:   Conjunctivae are normal. EOMI. PERRL. ENT      Head:   Normocephalic and atraumatic.      Nose:   No congestion/rhinnorhea.       Mouth/Throat:   MMM, no pharyngeal erythema. No peritonsillar mass.       Neck:   No meningismus. Full ROM. Hematological/Lymphatic/Immunilogical:   No cervical lymphadenopathy. Cardiovascular:   RRR. Symmetric bilateral radial and DP pulses.  No murmurs. Cap refill less than 2 seconds. Respiratory:   Normal respiratory effort without tachypnea/retractions. Breath sounds are clear and equal bilaterally. No wheezes/rales/rhonchi. Gastrointestinal:   Soft with suprapubic tenderness. Non distended. There is no CVA tenderness.  No rebound, rigidity, or guarding.  Musculoskeletal:   Normal range of motion in all extremities. No joint effusions.  No lower extremity tenderness.  No edema.  Healing ulcer on the right great toe with some erythema but no induration or swelling.  No purulent drainage or necrotic tissue Neurologic:   Normal speech and language.  Motor grossly intact. No acute focal neurologic  deficits are appreciated.  Skin:    Skin is warm, dry and intact except for right great toe wound as above, chronic. No rash noted.  No petechiae, purpura, or bullae.  ____________________________________________    LABS (pertinent positives/negatives) (all labs ordered are listed, but only abnormal results are displayed) Labs Reviewed  COMPREHENSIVE METABOLIC PANEL - Abnormal; Notable for the following components:      Result Value   CO2 36 (*)    Glucose, Bld 168 (*)    BUN 39 (*)    Albumin 3.1 (*)    AST 49 (*)    ALT 50 (*)    Alkaline Phosphatase 151 (*)    GFR calc non Af Amer 59 (*)    All other components within normal limits  CBC WITH DIFFERENTIAL/PLATELET - Abnormal; Notable for the following components:   WBC 10.7 (*)    RBC 4.05 (*)    Hemoglobin 12.2 (*)    MCV 101.0 (*)    MCHC 29.8 (*)    RDW 18.7 (*)    Abs Immature Granulocytes 0.10 (*)    All other components within normal limits  URINALYSIS, COMPLETE (UACMP) WITH MICROSCOPIC - Abnormal; Notable for the following components:   Color, Urine AMBER (*)    APPearance HAZY (*)    Protein, ur 100 (*)    Nitrite POSITIVE (*)    Bacteria, UA RARE (*)    All other components within normal limits  SARS CORONAVIRUS 2 (HOSPITAL ORDER, PERFORMED IN [facility])  URINE CULTURE  LACTIC ACID, PLASMA  PROTIME-INR  LACTIC ACID, PLASMA   ____________________________________________  EKG  Interpreted by me Sinus rhythm rate of 72, normal axis, first-degree AV block.  Normal QRS ST segments and T waves.  No ischemic changes.  ____________________________________________    RADIOLOGY  Dg Chest Port 1 View  Result Date: 01/30/2019 CLINICAL DATA:  Increased weakness.  Sleeping for several days. EXAM: PORTABLE CHEST 1 VIEW COMPARISON:  Radiograph 11/30/2018 FINDINGS: Stable enlarged cardiac silhouette. Low lung volumes. Bilateral pleural effusions. Central venous congestion. No focal consolidation no  pneumothorax. IMPRESSION: Findings suggest congestive heart failure with cardiomegaly, pleural effusions and central venous congestion. Electronically Signed   By: Genevive Bi M.D.   On: 01/30/2019 15:11    ____________________________________________   PROCEDURES Procedures  ____________________________________________    CLINICAL IMPRESSION / ASSESSMENT AND PLAN / ED COURSE  Pertinent labs & imaging results that were available during my care of the patient were reviewed by me and considered in my medical decision making (see chart for details).   HAWTHORN PATI was evaluated in Emergency Department on 01/30/2019 for the symptoms described in the history of present illness. He was evaluated in the context of the global COVID-19 pandemic, which necessitated consideration that the patient might be at risk for infection with the SARS-CoV-2 virus that causes COVID-19. Institutional protocols and algorithms that pertain to the evaluation of patients at risk for COVID-19 are in a state of rapid change based on information released by regulatory bodies including the CDC and federal and state organizations. These policies and algorithms were followed during the patient's care in the ED.   Patient presents with generalized weakness, somnolence, confusion.  Labs reveal nitrite positive UTI, otherwise okay electrolytes and creatinine.  No evidence of blood loss/acute anemia.  No history or signs of trauma on exam, CT imaging not needed at this time.  This appears to be encephalopathy related to cystitis.  He has a history of ESBL infection, so I have ordered meropenem and a urine culture.  Discussed with hospitalist for further management.  IV Lasix for management of his mild heart failure symptoms and chest x-ray findings consistent with heart failure.      ____________________________________________   FINAL CLINICAL IMPRESSION(S) / ED DIAGNOSES    Final diagnoses:  Weakness  Acute  metabolic encephalopathy  Cystitis     ED Discharge Orders    None      Portions of this note were generated with dragon dictation software. Dictation errors may occur despite best attempts at proofreading.   Sharman Cheek, MD 01/30/19 Eldridge Dace    Sharman Cheek, MD 01/30/19 [phone]

## 2019-01-30 NOTE — ED Notes (Signed)
Wife updated via phone

## 2019-01-30 NOTE — ED Triage Notes (Signed)
Pt comes from home via EMS after pts family has reported increased weakness and sleeping for a few days. Pt recently seen for infection in his right toe and is currently on antibiotics for it. Pt has some rhonchi in left lobe per EMS. Pt on 2L [place] at home normally.

## 2019-01-31 ENCOUNTER — Inpatient Hospital Stay: Payer: Medicare Other

## 2019-01-31 DIAGNOSIS — I619 Nontraumatic intracerebral hemorrhage, unspecified: Secondary | ICD-10-CM

## 2019-01-31 LAB — BASIC METABOLIC PANEL
Anion gap: 9 (ref 5–15)
BUN: 40 mg/dL — ABNORMAL HIGH (ref 8–23)
CO2: 36 mmol/L — ABNORMAL HIGH (ref 22–32)
Calcium: 8.6 mg/dL — ABNORMAL LOW (ref 8.9–10.3)
Chloride: 99 mmol/L (ref 98–111)
Creatinine, Ser: 1.05 mg/dL (ref 0.61–1.24)
GFR calc Af Amer: >60 mL/min (ref >60)
GFR calc non Af Amer: >60 mL/min (ref >60)
Glucose, Bld: 116 mg/dL — ABNORMAL HIGH (ref 70–99)
Potassium: 4.1 mmol/L (ref 3.5–5.1)
Sodium: 144 mmol/L (ref 135–145)

## 2019-01-31 LAB — BLOOD GAS, ARTERIAL
Acid-Base Excess: 15.5 mmol/L — ABNORMAL HIGH (ref 0.0–2.0)
Bicarbonate: 43.7 mmol/L — ABNORMAL HIGH (ref 20.0–28.0)
FIO2: 0.28
O2 Saturation: 93.5 %
Patient temperature: 37
pCO2 arterial: 69 mmHg (ref 32.0–48.0)
pH, Arterial: 7.41 (ref 7.350–7.450)
pO2, Arterial: 68 mmHg — ABNORMAL LOW (ref 83.0–108.0)

## 2019-01-31 LAB — MAGNESIUM: Magnesium: 2.2 mg/dL (ref 1.7–2.4)

## 2019-01-31 LAB — TROPONIN I
Troponin I: 0.07 ng/mL (ref <0.03)
Troponin I: 0.08 ng/mL (ref <0.03)

## 2019-01-31 MED ORDER — ORAL CARE MOUTH RINSE
15.0000 mL | Freq: Two times a day (BID) | OROMUCOSAL | Status: DC
  Administered 2019-02-01 – 2019-02-03 (×2): 15 mL via OROMUCOSAL

## 2019-01-31 MED ORDER — AMOXICILLIN-POT CLAVULANATE 875-125 MG PO TABS
1.0000 | ORAL_TABLET | Freq: Two times a day (BID) | ORAL | Status: DC
  Administered 2019-01-31 – 2019-02-01 (×3): 1 via ORAL
  Filled 2019-01-31 (×3): qty 1

## 2019-01-31 MED ORDER — ENOXAPARIN SODIUM 40 MG/0.4ML ~~LOC~~ SOLN: 40.0000 mg | SUBCUTANEOUS | Status: DC

## 2019-01-31 NOTE — Progress Notes (Signed)
PT Cancellation Note  Patient Details Name: Dylan [name] MRN: [id number] DOB: [birth year]   Cancelled Treatment:    Reason Eval/Treat Not Completed: Other (comment)(Hold this afternoon per nursing. Will follow patient closely and attempt again at later time/date. )  Precious Bard, PT, DPT   01/31/2019, 2:31 PM

## 2019-01-31 NOTE — Consult Note (Signed)
[facility]                                                      Patient Demographics  Dylan [name], is a 83 y.o. male   MRN: [id number]   DOB - 23-Apr-1936  Admit Date - 01/30/2019    Outpatient Primary MD for the patient is [name], MD  Consult requested in the Hospital by Enid Baas, MD, On 01/31/2019    Reason for consult bleeding wound to the right great toe   With History of -  Past Medical History:  Diagnosis Date  . CAD (coronary artery disease)   . Cervicalgia   . CHF (congestive heart failure) (HCC)   . COPD (chronic obstructive pulmonary disease) (HCC)   . Diastolic heart failure (HCC)   . Foot drop, right   . History of kidney stones   . Hyperlipidemia    unspecified  . Hypertension   . Myocardial infarction (HCC)   . Osteoarthritis   . Shoulder pain, left   . Sleep apnea   . Tremor, essential       Past Surgical History:  Procedure Laterality Date  . APPLICATION OF WOUND VAC Right 11/13/2017   Procedure: APPLICATION OF WOUND VAC;  Surgeon: Annice Needy, MD;  Location: [facility] ORS;  Service: Vascular;  Laterality: Right;  . BACK SURGERY  06/2010  . CARDIAC CATHETERIZATION Left 04/30/2016   Procedure: Left Heart Cath and Coronary Angiography;  Surgeon: Laurier Nancy, MD;  Location: [facility] INVASIVE CV LAB;  Service: Cardiovascular;  Laterality: Left;  . COLONOSCOPY    . CORONARY ANGIOPLASTY    . CORONARY/GRAFT ACUTE MI REVASCULARIZATION N/A 06/21/2018   Procedure: Coronary/Graft Acute MI Revascularization;  Surgeon: Iran Ouch, MD;  Location: [facility] INVAS[facility] CV [facility];  Service: Cardiovascular;  Laterality: N/A;  . KNEE ARTHROPLASTY Left 06/01/2018   Procedure: COMPUTER ASSISTED TOTAL KNEE ARTHROPLASTY;  Surgeon: Donato Heinz, MD;  Location: [facility] ORS;  Service: Orthopedics;  Laterality: Left;  . KNEE ARTHROSCOPY Left    . KNEE SURGERY Left 1998  . LEFT HEART CATH AND CORONARY ANGIOGRAPHY N/A 06/21/2018   Procedure: LEFT HEART CATH AND CORONARY ANGIOGRAPHY;  Surgeon: Iran Ouch, MD;  Location: [facility] INV[facility];  Service: Cardiovascular;  Laterality: N/A;  . ORIF FEMUR FRACTURE Left 08/27/2018   Procedure: OPEN REDUCTION INTERNAL FIXATION (ORIF) SUPRACONDYLAR FEMUR FRACTURE ABOVE PROSTHESIS, QUADRICEPS REPAIR;  Surgeon: Kennedy Bucker, MD;  Location: [facility] OR[facility];  Service: Orthopedics;  Laterality: Left;  . TEE WITHOUT CARDIOVERSION N/A 11/18/2018   Procedure: TRANSESOPHAGEAL ECHOCARDIOGRAM (TEE);  Surgeon: Lamar Blinks, MD;  Location: [facility] OR[facility];  Service: Cardiovascular;  Laterality: N/A;  . TONSILLECTOMY    . WOUND DEBRIDEMENT Right 11/13/2017   Procedure: DEBRIDEMENT WOUND;  Surgeon: Annice Needy, MD;  Location: [facility] OR[facility];  Service: Vascular;  Laterality: Right;    in for   Chief Complaint  Patient presents with  . Weakness     HPI  Dylan [name]  is a 83 y.o. male, patient has had an open wound on the right great toe for couple of months.  I saw him recently in the office about 10 to 12 days ago felt like he had some purulent drainage from the area [facility]  osteomyelitis as there is exposed bone in the wound also.  He is admitted to the hospital because of change in mental status and is admitted for CVA    Review of Systems    In addition to the HPI above,  No Fever-chills, No Headache, No changes with Vision or hearing, No problems swallowing food or Liquids, No Chest pain, Cough or Shortness of Breath, No Abdominal pain, No Nausea or Vommitting, Bowel movements are regular, No Blood in stool or Urine, No dysuria, No new skin rashes or bruises, No new joints pains-aches,  No new weakness, tingling, numbness in any extremity, No recent weight gain or loss, No polyuria, polydypsia or polyphagia, No significant Mental Stressors.  A full 10  point Review of Systems was done, except as stated above, all other Review of Systems were negative.   Social History Social History   Tobacco Use  . Smoking status: Former Smoker    Packs/day: 0.25    Years: 58.00    Pack years: 14.50    Types: Cigarettes  . Smokeless tobacco: Former Neurosurgeon  . Tobacco comment: quit the begginning of septembet 2019  Substance Use Topics  . Alcohol use: No    Alcohol/week: 0.0 standard drinks    Family History Family History  Problem Relation Age of Onset  . Diabetes Son   . Cancer Mother   . Colon cancer Mother   . Lung cancer Mother   . Tremor Father   . Heart disease Father   . Tremor Brother   . Bladder Cancer Brother   . Tremor Sister     Prior to Admission medications   Medication Sig Start Date End Date Taking? Authorizing Provider  amoxicillin-clavulanate (AUGMENTIN) 875-125 MG tablet Take 1 tablet by mouth every 12 (twelve) hours. 01/18/19 02/07/19 Yes [provider]  Ascorbic Acid (VITAMIN C PO) Take 500 mg by mouth daily.    Yes [provider]  aspirin EC 81 MG tablet Take 81 mg by mouth daily.   Yes [provider]  atorvastatin (LIPITOR) 80 MG tablet Take 80 mg by mouth daily.  09/04/18  Yes [provider]  carvedilol (COREG) 6.25 MG tablet Take 6.25 mg by mouth 2 (two) times daily with a meal. 09/07/18  Yes [provider]  Cholecalciferol (VITAMIN D-1000 MAX ST) 25 MCG (1000 UT) tablet Take 1 tablet (1,000 Units total) by mouth daily. 12/24/18  Yes Clarisa Kindred A, FNP  clopidogrel (PLAVIX) 75 MG tablet Take 75 mg by mouth daily.   Yes [provider]  doxycycline (VIBRA-TABS) 100 MG tablet Take 100 mg by mouth 2 (two) times a day. 01/26/19 02/15/19 Yes [provider]  folic acid (V-R FOLIC ACID) 400 MCG tablet Take 1 tablet (400 mcg total) by mouth daily. 12/24/18  Yes Clarisa Kindred A, FNP  furosemide (LASIX) 40 MG tablet Take 40 mg by mouth 2 (two) times daily.   Yes  [provider]  gabapentin (NEURONTIN) 300 MG capsule Take 1 capsule (300 mg total) by mouth at bedtime. 12/24/18  Yes Clarisa Kindred A, FNP  isosorbide mononitrate (IMDUR) 30 MG 24 hr tablet Take 30 mg by mouth daily.  05/31/16  Yes [provider]  Multiple Vitamins-Minerals (CENTROVITE) TABS Take 1 tablet by mouth daily.   Yes [provider]  Nicotine 10 MG/ML SOLN Place 1 spray into the nose as needed for smoking cessation. 01/07/19 04/07/19 Yes [provider]  pantoprazole (PROTONIX) 40 MG tablet Take 1 tablet (  40 mg total) by mouth daily. 12/01/18  Yes [name], [name], MD  potassium chloride SA (K-DUR) 20 MEQ tablet Take 1 tablet (20 mEq total) by mouth daily. 12/24/18  Yes Hackney, Inetta Fermo A, FNP  predniSONE (DELTASONE) 20 MG tablet Take 1 tablet (20 mg total) by mouth daily. 1 po daily 12/24/18  Yes Hackney, Tina A, FNP  primidone (MYSOLINE) 50 MG tablet Take 200 mg by mouth 2 (two) times daily.    Yes [provider]  QUEtiapine (SEROQUEL) 50 MG tablet Take 1 tablet (50 mg total) by mouth at bedtime as needed. 12/24/18  Yes Hackney, Tina A, FNP  sacubitril-valsartan (ENTRESTO) 49-51 MG Take 1 tablet by mouth 2 (two) times a day. 01/07/19  Yes [provider]  spironolactone (ALDACTONE) 25 MG tablet Take 12.5 mg by mouth daily.  09/04/18  Yes [provider]  tamsulosin (FLOMAX) 0.4 MG CAPS capsule Take 1 capsule (0.4 mg total) by mouth daily. 09/01/18  Yes Evon Slack, PA-C  torsemide (DEMADEX) 10 MG tablet Take 10 mg by mouth 2 (two) times a day. 12/28/18  Yes [provider]  traZODone (DESYREL) 50 MG tablet Take 50 mg by mouth at bedtime. 01/07/19 01/07/20 Yes [provider]  acetaminophen (TYLENOL) 500 MG tablet Take 1,000 mg by mouth 2 (two) times daily as needed for moderate pain.    [provider]  alum & mag hydroxide-simeth (MAALOX PLUS) 400-400-40 MG/5ML suspension Take 30 mLs by mouth every 4 (four)  hours as needed for indigestion. 09/04/18   [provider]  ipratropium-albuterol (DUONEB) 0.5-2.5 (3) MG/3ML SOLN Take 3 mLs by nebulization every 6 (six) hours as needed (shortness of breath).     [provider]  ketotifen (ZADITOR) 0.025 % ophthalmic solution Place 1 drop into both eyes as needed. 12/24/18   Delma Freeze, FNP  loratadine (CLARITIN) 10 MG tablet Take 1 tablet (10 mg total) by mouth as needed for allergies. 12/24/18   Delma Freeze, FNP  magnesium hydroxide (MILK OF MAGNESIA) 400 MG/5ML suspension Take 30 mLs by mouth daily as needed for mild constipation.     [provider]  nitroGLYCERIN (NITROSTAT) 0.3 MG SL tablet Place 0.3 mg under the tongue every 5 (five) minutes as needed for chest pain.    [provider]  polyethylene glycol (MIRALAX / GLYCOLAX) packet Take 17 g by mouth 2 (two) times daily as needed. Hold for loose stools. 09/29/18   [provider]  sennosides-docusate sodium (SENOKOT-S) 8.6-50 MG tablet Take 2 tablets by mouth daily as needed for constipation. 12/24/18   Clarisa Kindred A, FNP  umeclidinium-vilanterol (ANORO ELLIPTA) 62.5-25 MCG/INH AEPB Inhale 1 puff into the lungs as needed. 12/24/18   Delma Freeze, FNP    Anti-infectives (From admission, onward)   Start     Dose/Rate Route Frequency Ordered Stop   01/31/19 0100  meropenem (MERREM) 1 g in sodium chloride 0.9 % 100 mL IVPB     1 g 200 mL/hr over 30 Minutes Intravenous Every 8 hours 01/30/19 1708     01/30/19 1600  meropenem (MERREM) 1 g in sodium chloride 0.9 % 100 mL IVPB     1 g 200 mL/hr over 30 Minutes Intravenous  Once 01/30/19 1550 01/30/19 1712      Scheduled Meds: . atorvastatin  80 mg Oral Daily  . carvedilol  6.25 mg Oral BID WC  . cholecalciferol  1,000 Units Oral Daily  . folic acid  500  mcg Oral Daily  . furosemide  40 mg Intravenous Q12H  . gabapentin  300 mg Oral QHS  . isosorbide mononitrate  30 mg Oral Daily  . multivitamin  with minerals  1 tablet Oral Daily  . pantoprazole  40 mg Oral Q24H  . potassium chloride SA  20 mEq Oral Daily  . sacubitril-valsartan  1 tablet Oral BID  . sodium chloride flush  3 mL Intravenous Q12H  . spironolactone  12.5 mg Oral Daily  . tamsulosin  0.4 mg Oral QPM  . torsemide  10 mg Oral BID  . traZODone  50 mg Oral QHS  . vitamin C  500 mg Oral Daily   Continuous Infusions: . sodium chloride Stopped (01/31/19 0343)  . meropenem (MERREM) IV Stopped (01/31/19 0307)   PRN Meds:.sodium chloride, acetaminophen, acetaminophen, alum & mag hydroxide-simeth, ipratropium-albuterol, loratadine, magnesium hydroxide, nitroGLYCERIN, ondansetron (ZOFRAN) IV, polyethylene glycol, QUEtiapine, senna-docusate, sodium chloride flush, umeclidinium-vilanterol  No Known Allergies  Physical Exam  Vitals  Blood pressure 121/75, pulse 63, temperature 97.7 F (36.5 C), temperature source Oral, resp. rate 20, height 5\' 10"  (1.778 m), weight 119.1 kg, SpO2 99 %.  Lower Extremity exam:  Vascular: Palpable but diminished bilateral  Dermatological: Present is an ulceration on the dorsal medial IP joint of the right great toe approximately 8 mm in length and 9 mm in width.  Depth is down to bone as there is bone exposed in the area.  No surrounding cellulitis no evidence of purulent drainage.  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  Data Review  CBC Recent Labs  Lab 01/30/19 1404 01/30/19 2352  WBC 10.7* 11.4*  HGB 12.2* 11.3*  HCT 40.9 36.9*  PLT 217 191  MCV 101.0* 99.7  MCH 30.1 30.5  MCHC 29.8* 30.6  RDW 18.7* 18.4*  LYMPHSABS 2.1  --   MONOABS 1.0  --   EOSABS 0.2  --   BASOSABS 0.1  --    ------------------------------------------------------------------------------------------------------------------  Chemistries  Recent Labs  Lab 01/30/19 1404 01/30/19 2352  NA 144 144  K 4.6 4.1  CL 99 99   CO2 36* 36*  GLUCOSE 168* 116*  BUN 39* 40*  CREATININE 1.15 1.05  CALCIUM 9.0 8.6*  MG  --  2.2  AST 49*  --   ALT 50*  --   ALKPHOS 151*  --   BILITOT 1.0  --    ------------------------------------------------------------------------------------------------------------------ estimated creatinine clearance is 71.3 mL/min (by C-G formula based on SCr of 1.05 mg/dL). ------------------------------------------------------------------------------------------------------------------ No results for input(s): TSH, T4TOTAL, T3FREE, THYROIDAB in the last 72 hours.  Invalid input(s): FREET3 Urinalysis    Component Value Date/Time   COLORURINE AMBER (A) 01/30/2019 1404   APPEARANCEUR HAZY (A) 01/30/2019 1404   LABSPEC 1.018 01/30/2019 1404   PHURINE 5.0 01/30/2019 1404   GLUCOSEU NEGATIVE 01/30/2019 1404   HGBUR NEGATIVE 01/30/2019 1404   BILIRUBINUR NEGATIVE 01/30/2019 1404   KETONESUR NEGATIVE 01/30/2019 1404   PROTEINUR 100 (A) 01/30/2019 1404   UROBILINOGEN 0.2 06/07/2010 0426   NITRITE POSITIVE (A) 01/30/2019 1404   LEUKOCYTESUR NEGATIVE 01/30/2019 1404     Imaging results:   Ct Head Wo Contrast  Result Date: 01/31/2019 CLINICAL DATA:  Mental status changes this morning. Unable to answer questions. EXAM: CT HEAD WITHOUT CONTRAST TECHNIQUE: Contiguous axial images were obtained from the base of the skull through the vertex without intravenous contrast. COMPARISON:  11/30/2018 FINDINGS: Brain: Generalized brain atrophy. Chronic  small-vessel ischemic changes of the pons. Old small vessel infarctions of the inferior cerebellum on the left. Cerebral hemispheres show a background pattern of extensive chronic small-vessel ischemic change throughout the deep and subcortical white matter. Old lacunar infarctions in the basal ganglia and thalami. There is an acute small vessel infarction in the external capsule region of the deep insula on the left with associated hemorrhage. This measures  0.9 x 0.5 x 0.7 cm (volume = 0.2 cm^3). No evidence of mass lesion, hydrocephalus or extra-axial collection. Vascular: There is atherosclerotic calcification of the major vessels at the base of the brain. Skull: Negative Sinuses/Orbits: Clear/normal Other: None IMPRESSION: Acute lacunar infarction of the external capsule of the subinsular brain on the left with associated hemorrhage. This measures only 9 x 5 x 7 mm. No mass effect. Extensive atrophy and chronic small-vessel ischemic changes elsewhere throughout the brain. Call report in progress. Electronically Signed   By: Paulina Fusi M.D.   On: 01/31/2019 10:35   US Carotid Bilateral  Result Date: 01/31/2019 CLINICAL DATA:  Stroke EXAM: BILATERAL CAROTID DUPLEX ULTRASOUND TECHNIQUE: Wallace Cullens scale imaging, color Doppler and duplex ultrasound were performed of bilateral carotid and vertebral arteries in the neck. COMPARISON:  None. FINDINGS: Criteria: Quantification of carotid stenosis is based on velocity parameters that correlate the residual internal carotid diameter with NASCET-based stenosis levels, using the diameter of the distal internal carotid lumen as the denominator for stenosis measurement. The following velocity measurements were obtained: RIGHT ICA: 75 cm/sec CCA: 43 cm/sec SYSTOLIC ICA/CCA RATIO:  1.7 ECA: 57 cm/sec LEFT ICA: 75 cm/sec CCA: 34 cm/sec SYSTOLIC ICA/CCA RATIO:  2.2 ECA: 40 cm/sec RIGHT CAROTID ARTERY: Mild focal mixed plaque in the bulb. Low resistance internal carotid Doppler pattern is preserved. RIGHT VERTEBRAL ARTERY:  Antegrade. LEFT CAROTID ARTERY: There is mild scattered calcified plaque along the upper common carotid artery and bulb. Low resistance internal carotid Doppler pattern is preserved. LEFT VERTEBRAL ARTERY:  Antegrade. IMPRESSION: Less than 50% stenosis in the right and left internal carotid arteries. Electronically Signed   By: Jolaine Click M.D.   On: 01/31/2019 12:32   Dg Chest Port 1 View  Result Date:  01/30/2019 CLINICAL DATA:  Increased weakness.  Sleeping for several days. EXAM: PORTABLE CHEST 1 VIEW COMPARISON:  Radiograph 11/30/2018 FINDINGS: Stable enlarged cardiac silhouette. Low lung volumes. Bilateral pleural effusions. Central venous congestion. No focal consolidation no pneumothorax. IMPRESSION: Findings suggest congestive heart failure with cardiomegaly, pleural effusions and central venous congestion. Electronically Signed   By: Genevive Bi M.D.   On: 01/30/2019 15:11    Assessment & Plan: Patient is toe is stable at this point there is no significant evidence of infection it is bleeds easily because of the open nature of the wound.  Right now we will order daily dressing changes and continue with the Augmentin orally.  Seems to be working fairly well for him.  Due to a stroke he is not a surgical candidate at present if he improves and progresses with this we may be able to address the underlying bone condition down the road.  Active Problems:   Acute on chronic systolic CHF (congestive heart failure) (HCC)   Family Communication: Plan discussed with patient and **  Recardo Evangelist M.D on 01/31/2019 at 1:53 PM  Thank you for the consult, we will follow the patient with you in the Hospital.

## 2019-01-31 NOTE — Progress Notes (Signed)
Pastoral Care Visit   01/31/19 1700  Clinical Encounter Type  Visited With Patient  Visit Type Initial  Referral From Nurse  Consult/Referral To Chaplain   This chap made three attempts to visit pt.  Pt was asleep twice and gone from room the last time.  New OR  can be made if Pt would like to see a chap on Monday.

## 2019-01-31 NOTE — Consult Note (Signed)
Reason for Consult:L subcoritical stroke  Referring Physician: Dr. Nemiah CommanderKalisetti  CC: L lacunar infarct.   HPI: Dylan [name] is an 83 y.o. male with a known history of CAD, chronic systolic CHF, COPD and hypertension was brought into the emergency room with complaints of generalized weakness.  Also complained of shortness of breath.  Denies any chest pain.  No fevers.  Patient was evaluated in the emergency room and found to have evidence of urinary tract infection.  Pt was found to have UTI. Today became more altered and CTH was done with L external capsule stroke with small hemorrhagic conversion.  Past Medical History:  Diagnosis Date  . CAD (coronary artery disease)   . Cervicalgia   . CHF (congestive heart failure) (HCC)   . COPD (chronic obstructive pulmonary disease) (HCC)   . Diastolic heart failure (HCC)   . Foot drop, right   . History of kidney stones   . Hyperlipidemia    unspecified  . Hypertension   . Myocardial infarction (HCC)   . Osteoarthritis   . Shoulder pain, left   . Sleep apnea   . Tremor, essential     Past Surgical History:  Procedure Laterality Date  . APPLICATION OF WOUND VAC Right 11/13/2017   Procedure: APPLICATION OF WOUND VAC;  Surgeon: Annice Needyew, Jason S, MD;  Location: [facility] ORS;  Service: Vascular;  Laterality: Right;  . BACK SURGERY  06/2010  . CARDIAC CATHETERIZATION Left 04/30/2016   Procedure: Left Heart Cath and Coronary Angiography;  Surgeon: Laurier NancyShaukat A Khan, MD;  Location: [facility] INV[facility]IVE [facility];  Service: Cardiovascular;  Laterality: Left;  . COLONOSCOPY    . CORONARY ANGIOPLASTY    . CORONARY/GRAFT ACUTE MI REVASCULARIZATION N/A 06/21/2018   Procedure: Coronary/Graft Acute MI Revascularization;  Surgeon: Iran OuchArida, Muhammad A, MD;  Location: [facility];  Service: Cardiovascular;  Laterality: N/A;  . KNEE ARTHROPLASTY Left 06/01/2018   Procedure: COMPUTER ASSISTED TOTAL KNEE ARTHROPLASTY;  Surgeon: Donato HeinzHooten, James P, MD;  Location: [facility] ORS;   Service: Orthopedics;  Laterality: Left;  . KNEE ARTHROSCOPY Left   . KNEE SURGERY Left 1998  . LEFT HEART CATH AND CORONARY ANGIOGRAPHY N/A 06/21/2018   Procedure: LEFT HEART CATH AND CORONARY ANGIOGRAPHY;  Surgeon: Iran OuchArida, Muhammad A, MD;  Location: [facility];  Service: Cardiovascular;  Laterality: N/A;  . ORIF FEMUR FRACTURE Left 08/27/2018   Procedure: OPEN REDUCTION INTERNAL FIXATION (ORIF) SUPRACONDYLAR FEMUR FRACTURE ABOVE PROSTHESIS, QUADRICEPS REPAIR;  Surgeon: Kennedy BuckerMenz, Michael, MD;  Location: [facility];  Service: Orthopedics;  Laterality: Left;  . TEE WITHOUT CARDIOVERSION N/A 11/18/2018   Procedure: TRANSESOPHAGEAL ECHOCARDIOGRAM (TEE);  Surgeon: Lamar BlinksKowalski, Bruce J, MD;  Location: [facility];  Service: Cardiovascular;  Laterality: N/A;  . TONSILLECTOMY    . WOUND DEBRIDEMENT Right 11/13/2017   Procedure: DEBRIDEMENT WOUND;  Surgeon: Annice Needyew, Jason S, MD;  Location: [facility];  Service: Vascular;  Laterality: Right;    Family History  Problem Relation Age of Onset  . Diabetes Son   . Cancer Mother   . Colon cancer Mother   . Lung cancer Mother   . Tremor Father   . Heart disease Father   . Tremor Brother   . Bladder Cancer Brother   . Tremor Sister     Social History:  reports that he has quit smoking. His smoking use included cigarettes. He has a 14.50 pack-year smoking history. He has quit using smokeless tobacco. He reports that he does not drink alcohol or use drugs.  No Known Allergies  Medications: I have reviewed the patient's current medications.  ROS: Unable to obtain as on bipap   Physical Examination: Blood pressure 121/75, pulse 63, temperature 97.7 F (36.5 C), temperature source Oral, resp. rate 20, height  (1.778 m), weight 119.1 kg, SpO2 99 %.    Neurological Examination   Mental Status: Alert to name only Cranial Nerves: II: Discs flat bilaterally; Visual fields grossly normal, pupils equal, round, reactive to light and accommodation III,IV,  VI: ptosis not present, extra-ocular motions intact bilaterally V,VII: smile symmetric, facial light touch sensation normal bilaterally VIII: hearing normal bilaterally IX,X: gag reflex present XI: bilateral shoulder shrug XII: midline tongue extension Motor: Generalized weakness bilaterally  Sensory: not tested  Deep Tendon Reflexes: 1+ and symmetric throughout Plantars: Right: downgoing   Left: downgoing Cerebellar: Not tested       Laboratory Studies:   Basic Metabolic Panel: Recent Labs  Lab 01/30/19 1404 01/30/19 2352  NA 144 144  K 4.6 4.1  CL 99 99  CO2 36* 36*  GLUCOSE 168* 116*  BUN 39* 40*  CREATININE 1.15 1.05  CALCIUM 9.0 8.6*  MG  --  2.2    Liver Function Tests: Recent Labs  Lab 01/30/19 1404  AST 49*  ALT 50*  ALKPHOS 151*  BILITOT 1.0  PROT 7.2  ALBUMIN 3.1*   No results for input(s): LIPASE, AMYLASE in the last 168 hours. No results for input(s): AMMONIA in the last 168 hours.  CBC: Recent Labs  Lab 01/30/19 1404 01/30/19 2352  WBC 10.7* 11.4*  NEUTROABS 7.3  --   HGB 12.2* 11.3*  HCT 40.9 36.9*  MCV 101.0* 99.7  PLT 217 191    Cardiac Enzymes: Recent Labs  Lab 01/30/19 1404 01/30/19 2352 01/31/19 0611  TROPONINI 0.07* 0.07* 0.08*    BNP: Invalid input(s): POCBNP  CBG: No results for input(s): GLUCAP in the last 168 hours.  Microbiology: Results for orders placed or performed during the hospital encounter of 01/30/19  SARS Coronavirus 2 (CEPHEID - Performed in [facility] hospital lab), Hosp Order     Status: None   Collection Time: 01/30/19  4:03 PM  Result Value Ref Range Status   SARS Coronavirus 2 NEGATIVE NEGATIVE Final    Comment: (NOTE) If result is NEGATIVE SARS-CoV-2 target nucleic acids are NOT DETECTED. The SARS-CoV-2 RNA is generally detectable in upper and lower  respiratory specimens during the acute phase of infection. The lowest  concentration of SARS-CoV-2 viral copies this assay can detect is  250  copies / mL. A negative result does not preclude SARS-CoV-2 infection  and should not be used as the sole basis for treatment or other  patient management decisions.  A negative result may occur with  improper specimen collection / handling, submission of specimen other  than nasopharyngeal swab, presence of viral mutation(s) within the  areas targeted by this assay, and inadequate number of viral copies  (<250 copies / mL). A negative result must be combined with clinical  observations, patient history, and epidemiological information. If result is POSITIVE SARS-CoV-2 target nucleic acids are DETECTED. The SARS-CoV-2 RNA is generally detectable in upper and lower  respiratory specimens dur ing the acute phase of infection.  Positive  results are indicative of active infection with SARS-CoV-2.  Clinical  correlation with patient history and other diagnostic information is  necessary to determine patient infection status.  Positive results do  not rule out bacterial infection or co-infection with other viruses.  If result is PRESUMPTIVE POSTIVE SARS-CoV-2 nucleic acids MAY BE PRESENT.   A presumptive positive result was obtained on the submitted specimen  and confirmed on repeat testing.  While 2019 novel coronavirus  (SARS-CoV-2) nucleic acids may be present in the submitted sample  additional confirmatory testing may be necessary for epidemiological  and / or clinical management purposes  to differentiate between  SARS-CoV-2 and other Sarbecovirus currently known to infect humans.  If clinically indicated additional testing with an alternate test  methodology [phone]) is advised. The SARS-CoV-2 RNA is generally  detectable in upper and lower respiratory sp ecimens during the acute  phase of infection. The expected result is Negative. Fact Sheet for Patients:  BoilerBrush.com.cy Fact Sheet for Healthcare  Providers: https://pope.com/ This test is not yet approved or cleared by the [place] FDA and has been authorized for detection and/or diagnosis of SARS-CoV-2 by FDA under an Emergency Use Authorization (EUA).  This EUA will remain in effect (meaning this test can be used) for the duration of the COVID-19 declaration under Section 564(b)(1) of the Act, 21 U.S.C. section 360bbb-3(b)(1), unless the authorization is terminated or revoked sooner. Performed at [facility], [address] [place], [place] [address]     Coagulation Studies: Recent Labs    01/30/19 1404  LABPROT 13.9  INR 1.1    Urinalysis:  Recent Labs  Lab 01/30/19 1404  COLORURINE AMBER*  LABSPEC 1.018  PHURINE 5.0  GLUCOSEU NEGATIVE  HGBUR NEGATIVE  BILIRUBINUR NEGATIVE  KETONESUR NEGATIVE  PROTEINUR 100*  NITRITE POSITIVE*  LEUKOCYTESUR NEGATIVE    Lipid Panel:     Component Value Date/Time   CHOL 100 09/08/2017 0400   TRIG 61 09/08/2017 0400   HDL 34 (L) 09/08/2017 0400   CHOLHDL 2.9 09/08/2017 0400   VLDL 12 09/08/2017 0400   LDLCALC 54 09/08/2017 0400    HgbA1C:  Lab Results  Component Value Date   HGBA1C 5.8 (H) 05/20/2018    Urine Drug Screen:  No results found for: LABOPIA, COCAINSCRNUR, LABBENZ, AMPHETMU, THCU, LABBARB  Alcohol Level: No results for input(s): ETH in the last 168 hours.  Other results: EKG: normal EKG, normal sinus rhythm, unchanged from previous tracings.  Imaging: Ct Head Wo Contrast  Result Date: 01/31/2019 CLINICAL DATA:  Mental status changes this morning. Unable to answer questions. EXAM: CT HEAD WITHOUT CONTRAST TECHNIQUE: Contiguous axial images were obtained from the base of the skull through the vertex without intravenous contrast. COMPARISON:  11/30/2018 FINDINGS: Brain: Generalized brain atrophy. Chronic small-vessel ischemic changes of the pons. Old small vessel infarctions of the inferior cerebellum on the left.  Cerebral hemispheres show a background pattern of extensive chronic small-vessel ischemic change throughout the deep and subcortical white matter. Old lacunar infarctions in the basal ganglia and thalami. There is an acute small vessel infarction in the external capsule region of the deep insula on the left with associated hemorrhage. This measures 0.9 x 0.5 x 0.7 cm (volume = 0.2 cm^3). No evidence of mass lesion, hydrocephalus or extra-axial collection. Vascular: There is atherosclerotic calcification of the major vessels at the base of the brain. Skull: Negative Sinuses/Orbits: Clear/normal Other: None IMPRESSION: Acute lacunar infarction of the external capsule of the subinsular brain on the left with associated hemorrhage. This measures only 9 x 5 x 7 mm. No mass effect. Extensive atrophy and chronic small-vessel ischemic changes elsewhere throughout the brain. Call report in progress. Electronically Signed   By: Paulina Fusi M.D.   On:  01/31/2019 10:35   US Carotid Bilateral  Result Date: 01/31/2019 CLINICAL DATA:  Stroke EXAM: BILATERAL CAROTID DUPLEX ULTRASOUND TECHNIQUE: Wallace Cullens scale imaging, color Doppler and duplex ultrasound were performed of bilateral carotid and vertebral arteries in the neck. COMPARISON:  None. FINDINGS: Criteria: Quantification of carotid stenosis is based on velocity parameters that correlate the residual internal carotid diameter with NASCET-based stenosis levels, using the diameter of the distal internal carotid lumen as the denominator for stenosis measurement. The following velocity measurements were obtained: RIGHT ICA: 75 cm/sec CCA: 43 cm/sec SYSTOLIC ICA/CCA RATIO:  1.7 ECA: 57 cm/sec LEFT ICA: 75 cm/sec CCA: 34 cm/sec SYSTOLIC ICA/CCA RATIO:  2.2 ECA: 40 cm/sec RIGHT CAROTID ARTERY: Mild focal mixed plaque in the bulb. Low resistance internal carotid Doppler pattern is preserved. RIGHT VERTEBRAL ARTERY:  Antegrade. LEFT CAROTID ARTERY: There is mild scattered calcified  plaque along the upper common carotid artery and bulb. Low resistance internal carotid Doppler pattern is preserved. LEFT VERTEBRAL ARTERY:  Antegrade. IMPRESSION: Less than 50% stenosis in the right and left internal carotid arteries. Electronically Signed   By: Jolaine Click M.D.   On: 01/31/2019 12:32   Dg Chest Port 1 View  Result Date: 01/30/2019 CLINICAL DATA:  Increased weakness.  Sleeping for several days. EXAM: PORTABLE CHEST 1 VIEW COMPARISON:  Radiograph 11/30/2018 FINDINGS: Stable enlarged cardiac silhouette. Low lung volumes. Bilateral pleural effusions. Central venous congestion. No focal consolidation no pneumothorax. IMPRESSION: Findings suggest congestive heart failure with cardiomegaly, pleural effusions and central venous congestion. Electronically Signed   By: Genevive Bi M.D.   On: 01/30/2019 15:11     Assessment/Plan:  83 y.o. male with a known history of CAD, chronic systolic CHF, COPD and hypertension was brought into the emergency room with complaints of generalized weakness.  Also complained of shortness of breath.  Denies any chest pain.  No fevers.  Patient was evaluated in the emergency room and found to have evidence of urinary tract infection.  Pt was found to have UTI. Today became more altered and CTH was done with L external capsule stroke with small hemorrhagic conversion.   - Repeat CTH in AM tomorrow or earlier if worsening mental status to make sure no increase in hemorrhage.  - d/c ASA and plavix - If CTH is stable tomorrow then restart ASA  in 48 hrs from now (24 hrs post repeat stable CT) - No plavix for likely 5-7 days -UTI as per primary team.   01/31/2019, 1:49 PM

## 2019-01-31 NOTE — Progress Notes (Signed)
Spoke with patient family member Tieran Vanboxtel  About the pt. care. Family stated that they would like Burna Cash to be the only one called about his fathers care and he will communicate with the rest of the family. Family member stated " they would like to know what the MRI result stated and would like to be updated tomorrow".

## 2019-01-31 NOTE — Progress Notes (Addendum)
Patient with altered mental status this morning, still arousable and able to follow commands. Stat ABG showing compensated respiratory acidosis and metabolic alkalosis.  pH is 7.41 Stat CT showing a small acute lacunar hemorrhagic infarct in the external capsule on the left side.  Neurology has been consulted.  NIH, neurochecks, PT/OT and speech consults ordered -Aspirin, Plavix and Lovenox are being discontinued at this time until neuro evaluation. -Also patient has a hematoma on his right big toe which she has been following with podiatry as outpatient and concern for bone infection for which patient was taking Augmentin as outpatient. -We will consult podiatry as the big toe wound is bleeding.  Called and updated patient's wife over the phone today

## 2019-01-31 NOTE — Progress Notes (Signed)
PT Cancellation Note  Patient Details Name: Dylan [name] MRN: [id number] DOB: February 14, 1936   Cancelled Treatment:    Reason Eval/Treat Not Completed: Fatigue/lethargy limiting ability to participate;Other (comment)(Per nursing patient became somnolent with garbled speech on bedpan, awaiting doctors orders. Will attempt again at later time/date. )  Precious Bard, PT, DPT   01/31/2019, 9:03 AM

## 2019-01-31 NOTE — TOC Initial Note (Signed)
Transition of Care [facility]) - Initial/Assessment Note    Patient Details  Name: Dylan [name] MRN: [id number] Date of Birth: [birth year]  [facility] CM/SW Contact:    Latanya Maudlin, RN Phone Number: 01/31/2019, 1:21 PM  Clinical Narrative:  [facility] team met with patient to complete high risk re admission assessment. Patient currently lethargic and requiring bipap. Obtained most information through chart review and medical staff. Patient lives at home with his wife. Patient is active with [facility]. Had recently stayed at [place] care for STR. Notified Helene Kelp of admission. Patient has cane and walker in home and appeared to received hospital bed and/or hoyer lift last admission. PCP is Benita Stabile. Uses [facility] in [place] team will follow for disposition and assess once patient is more medically stable.                 Expected Discharge Plan: [place] Barriers to Discharge: Continued Medical Work up   Patient Goals and CMS Choice        Expected Discharge Plan and Services Expected Discharge Plan: [place]   Discharge Planning Services: CM Consult Post Acute Care Choice: Resumption of Svcs/PTA Provider Living arrangements for the past 2 months: Single Family Home                           HH Arranged: RN, PT [place] Agency: [facility] (formerly [employer]) Date [place]: 01/31/19 Time [place]: 1001 Representative spoke with at [place]: [place] Arrangements/Services Living arrangements for the past 2 months: [place] with:: Spouse              Current home services: DME, Home PT, Home RN    Activities of Daily Living Home Assistive Devices/Equipment: Blood pressure cuff, Shower chair with back, Wheelchair, Environmental consultant (specify type), Oxygen ADL Screening (condition at time of admission) Patient's cognitive ability adequate to  safely complete daily activities?: Yes Is the patient deaf or have difficulty hearing?: Yes Does the patient have difficulty seeing, even when wearing glasses/contacts?: No Does the patient have difficulty concentrating, remembering, or making decisions?: No Patient able to express need for assistance with ADLs?: Yes Does the patient have difficulty dressing or bathing?: Yes Independently performs ADLs?: No Communication: Independent Dressing (OT): Needs assistance Is this a change from baseline?: Pre-admission baseline Grooming: Needs assistance Is this a change from baseline?: Pre-admission baseline Feeding: Independent Bathing: Needs assistance Is this a change from baseline?: Pre-admission baseline Toileting: Needs assistance Is this a change from baseline?: Pre-admission baseline In/Out Bed: Needs assistance Is this a change from baseline?: Pre-admission baseline Walks in Home: (Bed bound) Does the patient have difficulty walking or climbing stairs?: Yes Weakness of Legs: Both Weakness of Arms/Hands: Both  Permission Sought/Granted                  Emotional Assessment       Orientation: : Oriented to Self      Admission diagnosis:  Weakness [R53.1] Cystitis [L07.86] Acute metabolic encephalopathy [L54.49] Patient Active Problem List   Diagnosis Date Noted  . Acute respiratory failure with hypoxemia ([place]) 11/23/2018  . Goals of care, counseling/discussion   . Palliative care by specialist   . DNR (do not resuscitate) discussion   . Acute CHF (congestive heart failure) ([place]) 11/14/2018  . Acute  on chronic systolic CHF (congestive heart failure) ([place]) 10/27/2018  . HCAP (healthcare-associated pneumonia) 10/27/2018  . Hypokalemia 10/01/2018  . Hypertensive heart and kidney disease with acute on chronic diastolic congestive heart failure and stage 3 chronic kidney disease ([place]) 09/09/2018  . Chronic constipation 09/09/2018  . BPH with obstruction/lower urinary  tract symptoms 09/09/2018  . Obstructive sleep apnea of adult 09/09/2018  . Idiopathic peripheral neuropathy 09/09/2018  . Chronic kidney disease, stage 3, mod decreased GFR (HCC) 09/09/2018  . Anemia of chronic renal failure, stage 3 (moderate) ([place]) 09/09/2018  . Periprosthetic fracture around internal prosthetic left knee joint 08/26/2018  . Acute ST elevation myocardial infarction (STEMI) of inferior wall ([place]) 06/21/2018  . S/P total knee arthroplasty 06/01/2018  . Primary osteoarthritis of left knee 02/05/2018  . Sepsis ([place]) 01/10/2018  . Community acquired pneumonia 12/01/2017  . Pneumonia 12/01/2017  . Hyperlipidemia 09/23/2017  . Weakness 08/31/2017  . Benign essential tremor 10/18/2016  . Class 3 severe obesity due to excess calories with serious comorbidity and body mass index (BMI) of 45.0 to 49.9 in adult ([place]) 10/18/2016  . Varicose veins of lower extremities with ulcer ([place]) 06/17/2016  . Chronic venous insufficiency 06/17/2016  . Lymphedema 06/17/2016  . Acute on chronic diastolic heart failure ([place]) 06/10/2016  . COPD, severe ([place]) 05/24/2016  . AV block, [place] 1 02/09/2016  . Coronary atherosclerosis of native coronary artery 02/09/2016  . ST elevation MI (STEMI) ([place]) 02/08/2016  . Right foot drop 04/07/2014  . Rotator cuff rupture, complete 04/22/2013  . Neck pain 04/14/2013   PCP:  Albina Billet, MD Pharmacy:   [place], [place] - [address] [place] [place] [address] Phone: [phone] Fax: [phone]     Social Determinants of Health (SDOH) Interventions    Readmission Risk Interventions Readmission Risk Prevention Plan 01/31/2019 11/17/2018 11/17/2018  Transportation Screening - - -  Medication Review (RN Care Manager) Complete - -  PCP or Specialist appointment within 3-5 days of discharge - Complete Patient refused  PCP/Specialist Appt Not Complete comments - - wife is nurse; Dr. Hall Busing agrees to sign orders without  seeing patient per Ivin Booty at [facility] office; wife agrees to make appt.  [place] or Home Care Consult - - (No Data)  SW Recovery Care/Counseling [place] - - -  SNF Comments - - -  Some recent data might be hidden

## 2019-01-31 NOTE — Plan of Care (Signed)
  Problem: Education: Goal: Ability to demonstrate management of disease process will improve Outcome: Progressing   Problem: Cardiac: Goal: Ability to achieve and maintain adequate cardiopulmonary perfusion will improve Outcome: Progressing   Problem: Education: Goal: Knowledge of General Education information will improve Description Including pain rating scale, medication(s)/side effects and non-pharmacologic comfort measures Outcome: Progressing   Problem: Clinical Measurements: Goal: Will remain free from infection Outcome: Progressing   Problem: Elimination: Goal: Will not experience complications related to urinary retention Outcome: Progressing   Problem: Safety: Goal: Ability to remain free from injury will improve Outcome: Progressing   Problem: Skin Integrity: Goal: Risk for impaired skin integrity will decrease Outcome: Progressing

## 2019-01-31 NOTE — Progress Notes (Addendum)
[facility] [place] [facility]   PATIENT NAME: Dylan [name]    MR#:  [id number]  DATE OF BIRTH:  30-Aug-1936  SUBJECTIVE:  CHIEF COMPLAINT:   Chief Complaint  Patient presents with   Weakness   -According to RN, patient very sleepy this morning.  Went to evaluate patient, he is easily arousable and answering questions appropriately.  Patient states that he feels extremely tired and he is usually not like this. -Denies any focal deficits.  Speech is slightly thick.  Stat CT had been ordered -Also patient has sleep apnea and did not use his CPAP last night  REVIEW OF SYSTEMS:  Review of Systems  Constitutional: Positive for malaise/fatigue. Negative for chills and fever.  HENT: Negative for congestion, hearing loss and nosebleeds.   Eyes: Negative for blurred vision and double vision.  Respiratory: Positive for shortness of breath. Negative for cough and wheezing.   Cardiovascular: Positive for leg swelling. Negative for chest pain and palpitations.  Gastrointestinal: Negative for abdominal pain, constipation, diarrhea, nausea and vomiting.  Genitourinary: Negative for dysuria.  Musculoskeletal: Negative for myalgias.  Neurological: Negative for dizziness, focal weakness, seizures, weakness and headaches.  Psychiatric/Behavioral: Negative for depression.       Tired and sleepy    DRUG ALLERGIES:  No Known Allergies  VITALS:  Blood pressure (!) 132/58, pulse (!) 57, temperature 97.7 F (36.5 C), temperature source Oral, resp. rate 19, height 5\' 10"  (1.778 m), weight 119.1 kg, SpO2 99 %.  PHYSICAL EXAMINATION:  Physical Exam  GENERAL:  83 y.o.-year-old obese patient lying in the bed with no acute distress.  EYES: Pupils equal, round, reactive to light and accommodation. No scleral icterus. Extraocular muscles intact.  HEENT: Head atraumatic, normocephalic. Oropharynx and nasopharynx clear.  NECK:  Supple, no jugular venous distention. No thyroid  enlargement, no tenderness.  LUNGS: Normal breath sounds bilaterally, no wheezing, rales,rhonchi or crepitation. No use of accessory muscles of respiration. Decreased bibasilar breath sounds CARDIOVASCULAR: S1, S2 normal. No murmurs, rubs, or gallops.  ABDOMEN: Soft, obese, nontender, nondistended. Bowel sounds present. No organomegaly or mass.  EXTREMITIES: No pedal edema, cyanosis, or clubbing.  NEUROLOGIC: Cranial nerves II through XII are intact. He is sleepy, but easily arousable and following simple commands. Muscle strength equal in all extremities. Sensation intact. Gait not checked.  PSYCHIATRIC: The patient is sleepy, easily arousable and following commands SKIN: No obvious rash, lesion, or ulcer.       LABORATORY PANEL:   CBC Recent Labs  Lab 01/30/19 2352  WBC 11.4*  HGB 11.3*  HCT 36.9*  PLT 191   ------------------------------------------------------------------------------------------------------------------  Chemistries  Recent Labs  Lab 01/30/19 1404 01/30/19 2352  NA 144 144  K 4.6 4.1  CL 99 99  CO2 36* 36*  GLUCOSE 168* 116*  BUN 39* 40*  CREATININE 1.15 1.05  CALCIUM 9.0 8.6*  MG  --  2.2  AST 49*  --   ALT 50*  --   ALKPHOS 151*  --   BILITOT 1.0  --    ------------------------------------------------------------------------------------------------------------------  Cardiac Enzymes Recent Labs  Lab 01/31/19 0611  TROPONINI 0.08*   ------------------------------------------------------------------------------------------------------------------  RADIOLOGY:  Dg Chest Port 1 View  Result Date: 01/30/2019 CLINICAL DATA:  Increased weakness.  Sleeping for several days. EXAM: PORTABLE CHEST 1 VIEW COMPARISON:  Radiograph 11/30/2018 FINDINGS: Stable enlarged cardiac silhouette. Low lung volumes. Bilateral pleural effusions. Central venous congestion. No focal consolidation no pneumothorax. IMPRESSION: Findings suggest congestive heart failure  with cardiomegaly,  pleural effusions and central venous congestion. Electronically Signed   By: Genevive Bi M.D.   On: 01/30/2019 15:11    EKG:   Orders placed or performed during the hospital encounter of 01/30/19   ED EKG   ED EKG   EKG 12-Lead   EKG 12-Lead    ASSESSMENT AND PLAN:   83 year old male with past medical history significant for CAD, COPD on chronic oxygen, hypertension presenting to hospital secondary to worsening shortness of breath.  1.  Acute on chronic systolic CHF exacerbation-echocardiogram from March 2020 with known EF of 40 to 45%. -Admitted for IV Lasix, continue daily weights and strict input and output monitoring. -Uses chronic home oxygen, currently on 2 L. -Continue Coreg, Entresto and Aldactone  2.  Acute encephalopathy-patient states he seems very tired. -Stat ABG and CT head have been ordered.  More likely from sleep apnea and not using his CPAP. -Restarted CPAP, not on any narcotics or sedatives -Follow-up the test results.  3.  UTI-cultures have been sent for.   Prior history of resistant Klebsiella in December 2019.  Currently on meropenem.  4.  Elevated troponin-demand ischemia.  Plateaued troponins.  Monitor  5.  DVT prophylaxis-on Lovenox  Physical therapy consult when more alert   All the records are reviewed and case discussed with Care Management/Social Workerr. Management plans discussed with the patient, family and they are in agreement.  CODE STATUS: Full code  TOTAL CRITICAL CARE TIME SPENT TAKING CARE OF THIS PATIENT: 45 minutes.   POSSIBLE D/C IN 2-3 DAYS, DEPENDING ON CLINICAL CONDITION.   Enid Baas M.D on 01/31/2019 at 9:38 AM  Between 7am to 6pm - Pager - [phone]  After 6pm go to [email or web address] - Social research officer, government  [facility]  Office  [phone]  CC: Primary care physician; [name], MD

## 2019-01-31 NOTE — Plan of Care (Signed)
Pt is more awake. A&ox4.  NIH 11. Bilateral LE with no efforts against gravity, and LE ataxia. Per pt he is bed bound and uses a hoyer lift at home. Pt passed swallow screen. Denies pain. VSS.  Uses CPAP intermittently while asleep.

## 2019-01-31 NOTE — Progress Notes (Signed)
Pt on the bedpan. Somnolent, drowsy. Speech garbled. Respond to voice, answers appropriately. VSS. Pt was more awake,alert when placed on the bedpan. Notified Dr Gareth Morgan of the above. MD to place orders.

## 2019-01-31 NOTE — Progress Notes (Signed)
Anticoagulation monitoring(Lovenox):  83yo  male ordered Lovenox 40 mg Q12h  Filed Weights   01/30/19 1348 01/31/19 0408  Weight: 279 lb 15.8 oz (127 kg) 262 lb 8 oz (119.1 kg)   BMI 37.66   Lab Results  Component Value Date   CREATININE 1.05 01/30/2019   CREATININE 1.15 01/30/2019   CREATININE 1.26 (H) 11/30/2018   Estimated Creatinine Clearance: 71.3 mL/min (by C-G formula based on SCr of 1.05 mg/dL). Hemoglobin & Hematocrit     Component Value Date/Time   HGB 11.3 (L) 01/30/2019 2352   HGB 13.0 11/30/2012 1708   HCT 36.9 (L) 01/30/2019 2352   HCT 38.0 (L) 11/30/2012 1708     Per Protocol for Patient with estCrcl > 30 ml/min and BMI < 40, will transition to Lovenox 40 mg Q24h.

## 2019-02-01 ENCOUNTER — Telehealth (HOSPITAL_COMMUNITY): Payer: Self-pay

## 2019-02-01 ENCOUNTER — Inpatient Hospital Stay (HOSPITAL_COMMUNITY)
Admit: 2019-02-01 | Discharge: 2019-02-01 | Disposition: A | Discharge status: Home / Self Care | Payer: Medicare Other | Attending: Internal Medicine | Admitting: Internal Medicine

## 2019-02-01 ENCOUNTER — Inpatient Hospital Stay: Payer: Medicare Other

## 2019-02-01 DIAGNOSIS — G9341 Metabolic encephalopathy: Secondary | ICD-10-CM

## 2019-02-01 DIAGNOSIS — I340 Nonrheumatic mitral (valve) insufficiency: Secondary | ICD-10-CM

## 2019-02-01 LAB — BASIC METABOLIC PANEL
Anion gap: 7 (ref 5–15)
BUN: 37 mg/dL — ABNORMAL HIGH (ref 8–23)
CO2: 36 mmol/L — ABNORMAL HIGH (ref 22–32)
Calcium: 8.4 mg/dL — ABNORMAL LOW (ref 8.9–10.3)
Chloride: 99 mmol/L (ref 98–111)
Creatinine, Ser: 0.96 mg/dL (ref 0.61–1.24)
GFR calc Af Amer: >60 mL/min (ref >60)
GFR calc non Af Amer: >60 mL/min (ref >60)
Glucose, Bld: 99 mg/dL (ref 70–99)
Potassium: 4 mmol/L (ref 3.5–5.1)
Sodium: 142 mmol/L (ref 135–145)

## 2019-02-01 LAB — MRSA PCR SCREENING: MRSA by PCR: POSITIVE — AB

## 2019-02-01 NOTE — Evaluation (Signed)
Physical Therapy Evaluation Patient Details Name: Dylan [name] MRN: [id number] DOB:Dylan [name] Today's Date: 02/01/2019   History of Present Illness  Dylan [name] is an 83yo male who comes [facility] on 5/30 for generalized weakness, Pt was found to have UTI. Acute hemorrhage without infarct also noted on CTHx2 adn MRI, now found to be stable. Pt also seen by podiatry for Right 1st ray MTP wound, no surgical needs at this time. PMH: CAD, MI, COPD, Rt Foot Drop, Ct TKA 2019, Lt femur fracture (Dec 2019) s/p ORIF/quads repair (no longer under precautions). Pt has not successfully AMB since Dec, has recently been total care with 24/7 caregiver assistance.   Clinical Impression  Pt admitted with above diagnosis. Pt currently with functional limitations due to the deficits listed below (see "PT Problem List"). Upon entry, pt in bed, asleep, easily awakened, and agreeable to participate. The pt is alert to self, pleasant, conversational, and answers basic questions without difficulty. MMT demonstrating functional strength deficits on Lt>Rt, but similar to baseline, however with subjective weakness. From a functional mobility and independence standpoint, pt is essentially at baseline, requiring +1-2 max-totalA for ADL completion and mechanical lift for transfers from bed to chair. Pt will benefit from skilled PT intervention to increase independence and safety with basic mobility in preparation for discharge to the venue listed below.       Follow Up Recommendations Home health PT;Supervision - Intermittent;Supervision for mobility/OOB(total care for bathing, dressing, toiletting PTA)    Equipment Recommendations  None recommended by PT    Recommendations for Other Services       Precautions / Restrictions Precautions Precautions: Fall Precaution Comments: *Right 1st MTP wound.  Restrictions Weight Bearing Restrictions: No      Mobility  Bed Mobility Overal bed mobility: (reports rolling  Max-totalA +1 PTA, and similar performance here with nursing staff. ) Bed Mobility: Rolling Rolling: +2 for physical assistance;Max assist         General bed mobility comments: endorses some weakness compared to recent baseline. pt has been somnolent and c resolving UTI   Transfers Overall transfer level: Needs assistance               General transfer comment: Pt total assist for transfers. Uses hoyer lift at home.   Ambulation/Gait                Stairs            Wheelchair Mobility    Modified Rankin (Stroke Patients Only)       Balance Overall balance assessment: History of Falls                                           Pertinent Vitals/Pain Pain Assessment: No/denies pain Pain Score: 6  Pain Location: Low back. Pain Descriptors / Indicators: Sore Pain Intervention(s): Limited activity within patient's tolerance;Monitored during session;Utilized relaxation techniques    Home Living Family/patient expects to be discharged to:: Private residence Living Arrangements: Spouse/significant other Available Help at Discharge: Family;Neighbor;Personal care attendant;Other (Comment)(Pt stated he has round-the-clock care from PCAs. ) Type of Home: House Home Access: Ramped entrance     Home Layout: One level Home Equipment: Walker - 2 wheels;Cane - single point;Bedside commode;Shower seat;Grab bars - tub/shower;Wheelchair - Careers adviser (comment);Hospital bed(Hoyer lift, stander. ) Additional Comments: Pt has a hospital bed in the living room along with a  hoyer lift that his PCA can assist him with ADLs. Wife takes care of cooking and IADLs    Prior Function Level of Independence: Needs assistance   Gait / Transfers Assistance Needed: Per patient, he has not been able to stand up or ambulate since his L femur ORIF and quad repair December of 2019.  Pt requires a hoyer lift for transfers.   ADL's / Homemaking Assistance Needed: Total  assistance with ADLs from his PCA  Comments: Pt has an Rt AFO for foot drop      Hand Dominance   Dominant Hand: Right    Extremity/Trunk Assessment   Upper Extremity Assessment Upper Extremity Assessment: RUE deficits/detail;LUE deficits/detail RUE Deficits / Details: grossly 5/5 elbow ext/flexion LUE Deficits / Details: grossly 4/5 elbow ext/flexion    Lower Extremity Assessment Lower Extremity Assessment: RLE deficits/detail;LLE deficits/detail RLE Deficits / Details: Chronic foot drop s activation; 3/5 hip/knee flexion; 4+/5 hip/knee extension.  LLE Deficits / Details: 3-/5 hip/knee flexion; 4-/5 hip/knee extension.        Communication   Communication: No difficulties  Cognition Arousal/Alertness: Awake/alert Behavior During Therapy: WFL for tasks assessed/performed Overall Cognitive Status: Within Functional Limits for tasks assessed                                 General Comments: Pt sleepy but able to answer questions, participate in strength testing.       General Comments General comments (skin integrity, edema, etc.): Pt on bedpan at end of session. RN and nurse tech notified.     Exercises Other Exercises Other Exercises: Pt educated in ECS including PLB to promote increased activity tolerance and endurance during ADLs. Handout left in room with pt. Would benefit from reinforcement.  Other Exercises: Pt educated in safe use of AE for adls (catalog left in room with pages marked for adapted utensils as pt has difficulty with self-feeding 2/2 tremors), as well as strategies for bed mobility.   Assessment/Plan    PT Assessment Patient needs continued PT services  PT Problem List Cardiopulmonary status limiting activity;Decreased strength;Decreased activity tolerance;Decreased mobility       PT Treatment Interventions Therapeutic exercise;DME instruction;Functional mobility training;Therapeutic activities;Patient/family education;Balance  training    PT Goals (Current goals can be found in the Care Plan section)  Acute Rehab PT Goals Patient Stated Goal: regain strength as PTA  PT Goal Formulation: With patient Time For Goal Achievement: 02/15/19 Potential to Achieve Goals: Good    Frequency Min 2X/week   Barriers to discharge        Co-evaluation               AM-PAC PT "6 Clicks" Mobility  Outcome Measure Help needed turning from your back to your side while in a flat bed without using bedrails?: Total Help needed moving from lying on your back to sitting on the side of a flat bed without using bedrails?: Total Help needed moving to and from a bed to a chair (including a wheelchair)?: Total Help needed standing up from a chair using your arms (e.g., wheelchair or bedside chair)?: Total Help needed to walk in hospital room?: Total Help needed climbing 3-5 steps with a railing? : Total 6 Click Score: 6    End of Session Equipment Utilized During Treatment: Oxygen Activity Tolerance: Patient tolerated treatment well;No increased pain Patient left: in bed;with call bell/phone within reach;with bed alarm set(heels floated on pillow, Rt  foot higher 2/2 foot drop)   PT Visit Diagnosis: Muscle weakness (generalized) (M62.81);Other abnormalities of gait and mobility (R26.89);Other symptoms and signs involving the nervous system (R29.898)    Time: [id number] PT Time Calculation (min) (ACUTE ONLY): 11 min   Charges:   PT Evaluation $PT Eval Moderate Complexity: 1 Mod          3:43 PM, 02/01/19 Rosamaria LintsAllan C Toniqua Melamed, PT, DPT Physical Therapist - [facility]  [phone] (ASCOM)   Venice Liz C 02/01/2019, 3:38 PM

## 2019-02-01 NOTE — Progress Notes (Addendum)
Subjective: Patient continues to be weak and tremulous.  No new neurological complaints.  Objective: Current vital signs: BP 125/71 (BP Location: Right Arm)   Pulse 67   Temp 97.7 F (36.5 C) (Oral)   Resp 16   Ht  (1.778 m)   Wt 118.5 kg   SpO2 94%   BMI 37.48 kg/m  Vital signs in last 24 hours: Temp:  [97.5 F (36.4 C)-97.8 F (36.6 C)] 97.7 F (36.5 C) (06/01 0502) Pulse Rate:  [60-79] 67 (06/01 0502) Resp:  [15-20] 16 (06/01 0502) BP: (125-165)/(66-97) 125/71 (06/01 0502) SpO2:  [94 %-100 %] 94 % (06/01 0502) Weight:  [161.0 kg] 118.5 kg (06/01 0502)  Intake/Output from previous day: 05/31 0701 - 06/01 0700 In: -  Out: 1000 [Urine:1000] Intake/Output this shift: Total I/O In: 3 [I.V.:3] Out: 120 [Urine:120] Nutritional status:  Diet Order            Diet Heart Room service appropriate? Yes with Assist; Fluid consistency: Thin  Diet effective now              Neurologic Exam: Mental Status: Alert, oriented, thought content appropriate.  Speech fluent without evidence of aphasia.  Able to follow 3 step commands without difficulty. Cranial Nerves: II: Discs flat bilaterally; Visual fields grossly normal, pupils equal, round, reactive to light and accommodation III,IV, VI: ptosis not present, extra-ocular motions intact bilaterally V,VII: smile symmetric, facial light touch sensation normal bilaterally VIII: hearing normal bilaterally IX,X: gag reflex present XI: bilateral shoulder shrug XII: midline tongue extension Motor: Generalized weakness.  Unable to lift arms above 90 degrees due to shoulder limitation. Left leg weaker than right.  Tremor. Sensory: Pinprick and light touch intact throughout, bilaterally Deep Tendon Reflexes: 1+ and symmetric throughout   Lab Results: Basic Metabolic Panel: Recent Labs  Lab 01/30/19 1404 01/30/19 2352 02/01/19 0545  NA 144 144 142  K 4.6 4.1 4.0  CL 99 99 99  CO2 36* 36* 36*  GLUCOSE 168* 116* 99   BUN 39* 40* 37*  CREATININE 1.15 1.05 0.96  CALCIUM 9.0 8.6* 8.4*  MG  --  2.2  --     Liver Function Tests: Recent Labs  Lab 01/30/19 1404  AST 49*  ALT 50*  ALKPHOS 151*  BILITOT 1.0  PROT 7.2  ALBUMIN 3.1*   No results for input(s): LIPASE, AMYLASE in the last 168 hours. No results for input(s): AMMONIA in the last 168 hours.  CBC: Recent Labs  Lab 01/30/19 1404 01/30/19 2352  WBC 10.7* 11.4*  NEUTROABS 7.3  --   HGB 12.2* 11.3*  HCT 40.9 36.9*  MCV 101.0* 99.7  PLT 217 191    Cardiac Enzymes: Recent Labs  Lab 01/30/19 1404 01/30/19 2352 01/31/19 0611  TROPONINI 0.07* 0.07* 0.08*    Lipid Panel: No results for input(s): CHOL, TRIG, HDL, CHOLHDL, VLDL, LDLCALC in the last 168 hours.  CBG: No results for input(s): GLUCAP in the last 168 hours.  Microbiology: Results for orders placed or performed during the hospital encounter of 01/30/19  Urine Culture     Status: Abnormal (Preliminary result)   Collection Time: 01/30/19  3:49 PM  Result Value Ref Range Status   Specimen Description   Final    URINE, RANDOM Performed at [facility], [address] [place], [place] [address]    Special Requests   Final    NONE Performed at [facility], [address] [place], [place] [address]  Culture >=100,000 COLONIES/mL PROVIDENCIA STUARTII (A)  Final   Report Status PENDING  Incomplete  SARS Coronavirus 2 (CEPHEID - Performed in [facility] hospital lab), Hosp Order     Status: None   Collection Time: 01/30/19  4:03 PM  Result Value Ref Range Status   SARS Coronavirus 2 NEGATIVE NEGATIVE Final    Comment: (NOTE) If result is NEGATIVE SARS-CoV-2 target nucleic acids are NOT DETECTED. The SARS-CoV-2 RNA is generally detectable in upper and lower  respiratory specimens during the acute phase of infection. The lowest  concentration of SARS-CoV-2 viral copies this assay can detect is 250  copies / mL. A negative result does not  preclude SARS-CoV-2 infection  and should not be used as the sole basis for treatment or other  patient management decisions.  A negative result may occur with  improper specimen collection / handling, submission of specimen other  than nasopharyngeal swab, presence of viral mutation(s) within the  areas targeted by this assay, and inadequate number of viral copies  (<250 copies / mL). A negative result must be combined with clinical  observations, patient history, and epidemiological information. If result is POSITIVE SARS-CoV-2 target nucleic acids are DETECTED. The SARS-CoV-2 RNA is generally detectable in upper and lower  respiratory specimens dur ing the acute phase of infection.  Positive  results are indicative of active infection with SARS-CoV-2.  Clinical  correlation with patient history and other diagnostic information is  necessary to determine patient infection status.  Positive results do  not rule out bacterial infection or co-infection with other viruses. If result is PRESUMPTIVE POSTIVE SARS-CoV-2 nucleic acids MAY BE PRESENT.   A presumptive positive result was obtained on the submitted specimen  and confirmed on repeat testing.  While 2019 novel coronavirus  (SARS-CoV-2) nucleic acids may be present in the submitted sample  additional confirmatory testing may be necessary for epidemiological  and / or clinical management purposes  to differentiate between  SARS-CoV-2 and other Sarbecovirus currently known to infect humans.  If clinically indicated additional testing with an alternate test  methodology [phone]) is advised. The SARS-CoV-2 RNA is generally  detectable in upper and lower respiratory sp ecimens during the acute  phase of infection. The expected result is Negative. Fact Sheet for Patients:  BoilerBrush.com.cy Fact Sheet for Healthcare Providers: https://pope.com/ This test is not yet approved or cleared by  the [place] FDA and has been authorized for detection and/or diagnosis of SARS-CoV-2 by FDA under an Emergency Use Authorization (EUA).  This EUA will remain in effect (meaning this test can be used) for the duration of the COVID-19 declaration under Section 564(b)(1) of the Act, 21 U.S.C. section 360bbb-3(b)(1), unless the authorization is terminated or revoked sooner. Performed at [facility], [address] [place], [place] [address]     Coagulation Studies: Recent Labs    01/30/19 1404  LABPROT 13.9  INR 1.1    Imaging: Ct Head Wo Contrast  Result Date: 02/01/2019 CLINICAL DATA:  Follow up acute hemorrhagic stroke in the left external capsule. Improving mental status. EXAM: CT HEAD WITHOUT CONTRAST TECHNIQUE: Contiguous axial images were obtained from the base of the skull through the vertex without intravenous contrast. COMPARISON:  CT head 01/31/2019 and 11/30/2018.  MRI 01/31/2019. FINDINGS: Brain: Small focus of acute hemorrhage in the left external capsule has mildly improved compared with the CT from yesterday (image 18/2). There are no new areas of acute intracranial hemorrhage. There is no extra-axial fluid collection, midline  shift or hydrocephalus. There is generalized atrophy with prominence of the ventricles and subarachnoid spaces. Extensive small vessel ischemic changes are present within the periventricular white matter and basal ganglia bilaterally. There is no CT evidence of acute cortical infarction. Vascular: Prominent intracranial vascular calcifications. No hyperdense vessel identified. Skull: Negative for fracture or focal lesion. Sinuses/Orbits: The visualized paranasal sinuses and mastoid air cells are clear. No orbital abnormalities are seen. Other: None. IMPRESSION: 1. Slight improvement in the focal hemorrhage in the left external capsule compared with yesterday's CT. No progressive mass effect or new hemorrhage identified. 2. Otherwise stable  extensive chronic small vessel ischemic changes. Electronically Signed   By: Carey Bullocks M.D.   On: 02/01/2019 12:55   Ct Head Wo Contrast  Result Date: 01/31/2019 CLINICAL DATA:  Mental status changes this morning. Unable to answer questions. EXAM: CT HEAD WITHOUT CONTRAST TECHNIQUE: Contiguous axial images were obtained from the base of the skull through the vertex without intravenous contrast. COMPARISON:  11/30/2018 FINDINGS: Brain: Generalized brain atrophy. Chronic small-vessel ischemic changes of the pons. Old small vessel infarctions of the inferior cerebellum on the left. Cerebral hemispheres show a background pattern of extensive chronic small-vessel ischemic change throughout the deep and subcortical white matter. Old lacunar infarctions in the basal ganglia and thalami. There is an acute small vessel infarction in the external capsule region of the deep insula on the left with associated hemorrhage. This measures 0.9 x 0.5 x 0.7 cm (volume = 0.2 cm^3). No evidence of mass lesion, hydrocephalus or extra-axial collection. Vascular: There is atherosclerotic calcification of the major vessels at the base of the brain. Skull: Negative Sinuses/Orbits: Clear/normal Other: None IMPRESSION: Acute lacunar infarction of the external capsule of the subinsular brain on the left with associated hemorrhage. This measures only 9 x 5 x 7 mm. No mass effect. Extensive atrophy and chronic small-vessel ischemic changes elsewhere throughout the brain. Call report in progress. Electronically Signed   By: Paulina Fusi M.D.   On: 01/31/2019 10:35   Mr [name] Head Wo Contrast  Result Date: 01/31/2019 CLINICAL DATA:  Generalized weakness and somnolence with confusion EXAM: MRI HEAD WITHOUT CONTRAST MRA HEAD WITHOUT CONTRAST TECHNIQUE: Multiplanar, multiecho pulse sequences of the brain and surrounding structures were obtained without intravenous contrast. Angiographic images of the head were obtained using MRA technique  without contrast. COMPARISON:  Head CT from earlier today FINDINGS: MRI HEAD FINDINGS Brain: Known acute hemorrhage in the left external capsule measuring up to 9 x 5 mm by CT. Diffusion in this area is complicated by paramagnetic effects; no large underlying infarct. No acute infarct elsewhere. There is a background of moderate or advanced generalized atrophy. Advanced chronic small vessel ischemia with confluent gliosis in the periventricular white matter and remote lacunar infarcts in the deep gray nuclei. Small remote left cerebellar infarcts. No hydrocephalus or masslike finding. Vascular: Arterial findings below. Normal dural venous sinus flow voids. Skull and upper cervical spine: Negative for marrow lesion Sinuses/Orbits: Mild mucosal thickening and secretions in the sphenoid sinuses. MRA HEAD FINDINGS Effectively no flow seen in the left vertebral artery. The left PICA is patent. No intracranial proximal flow limiting stenosis or major branch occlusion. There is atherosclerotic irregularity of bilateral medium size vessels, most notable at the P3 segments. Negative for aneurysm. IMPRESSION: Brain MRI: 1. 9 mm acute hemorrhage in the left external capsule. No enlargement since prior CT. No large underlying infarct. 2. Advanced chronic small vessel ischemia with generalized cerebral atrophy. Intracranial MRA: Intracranial  atherosclerosis without proximal flow limiting stenosis. Electronically Signed   By: Marnee Spring M.D.   On: 01/31/2019 17:27   Mr Brain Wo Contrast  Result Date: 01/31/2019 CLINICAL DATA:  Generalized weakness and somnolence with confusion EXAM: MRI HEAD WITHOUT CONTRAST MRA HEAD WITHOUT CONTRAST TECHNIQUE: Multiplanar, multiecho pulse sequences of the brain and surrounding structures were obtained without intravenous contrast. Angiographic images of the head were obtained using MRA technique without contrast. COMPARISON:  Head CT from earlier today FINDINGS: MRI HEAD FINDINGS Brain:  Known acute hemorrhage in the left external capsule measuring up to 9 x 5 mm by CT. Diffusion in this area is complicated by paramagnetic effects; no large underlying infarct. No acute infarct elsewhere. There is a background of moderate or advanced generalized atrophy. Advanced chronic small vessel ischemia with confluent gliosis in the periventricular white matter and remote lacunar infarcts in the deep gray nuclei. Small remote left cerebellar infarcts. No hydrocephalus or masslike finding. Vascular: Arterial findings below. Normal dural venous sinus flow voids. Skull and upper cervical spine: Negative for marrow lesion Sinuses/Orbits: Mild mucosal thickening and secretions in the sphenoid sinuses. MRA HEAD FINDINGS Effectively no flow seen in the left vertebral artery. The left PICA is patent. No intracranial proximal flow limiting stenosis or major branch occlusion. There is atherosclerotic irregularity of bilateral medium size vessels, most notable at the P3 segments. Negative for aneurysm. IMPRESSION: Brain MRI: 1. 9 mm acute hemorrhage in the left external capsule. No enlargement since prior CT. No large underlying infarct. 2. Advanced chronic small vessel ischemia with generalized cerebral atrophy. Intracranial MRA: Intracranial atherosclerosis without proximal flow limiting stenosis. Electronically Signed   By: Marnee Spring M.D.   On: 01/31/2019 17:27   US Carotid Bilateral  Result Date: 01/31/2019 CLINICAL DATA:  Stroke EXAM: BILATERAL CAROTID DUPLEX ULTRASOUND TECHNIQUE: Wallace Cullens scale imaging, color Doppler and duplex ultrasound were performed of bilateral carotid and vertebral arteries in the neck. COMPARISON:  None. FINDINGS: Criteria: Quantification of carotid stenosis is based on velocity parameters that correlate the residual internal carotid diameter with NASCET-based stenosis levels, using the diameter of the distal internal carotid lumen as the denominator for stenosis measurement. The  following velocity measurements were obtained: RIGHT ICA: 75 cm/sec CCA: 43 cm/sec SYSTOLIC ICA/CCA RATIO:  1.7 ECA: 57 cm/sec LEFT ICA: 75 cm/sec CCA: 34 cm/sec SYSTOLIC ICA/CCA RATIO:  2.2 ECA: 40 cm/sec RIGHT CAROTID ARTERY: Mild focal mixed plaque in the bulb. Low resistance internal carotid Doppler pattern is preserved. RIGHT VERTEBRAL ARTERY:  Antegrade. LEFT CAROTID ARTERY: There is mild scattered calcified plaque along the upper common carotid artery and bulb. Low resistance internal carotid Doppler pattern is preserved. LEFT VERTEBRAL ARTERY:  Antegrade. IMPRESSION: Less than 50% stenosis in the right and left internal carotid arteries. Electronically Signed   By: Jolaine Click M.D.   On: 01/31/2019 12:32   Dg Chest Port 1 View  Result Date: 01/30/2019 CLINICAL DATA:  Increased weakness.  Sleeping for several days. EXAM: PORTABLE CHEST 1 VIEW COMPARISON:  Radiograph 11/30/2018 FINDINGS: Stable enlarged cardiac silhouette. Low lung volumes. Bilateral pleural effusions. Central venous congestion. No focal consolidation no pneumothorax. IMPRESSION: Findings suggest congestive heart failure with cardiomegaly, pleural effusions and central venous congestion. Electronically Signed   By: Genevive Bi M.D.   On: 01/30/2019 15:11    Medications:  I have reviewed the patient's current medications. Scheduled: . amoxicillin-clavulanate  1 tablet Oral Q12H  . atorvastatin  80 mg Oral Daily  . cholecalciferol  1,000 Units  Oral Daily  . folic acid  500 mcg Oral Daily  . furosemide  40 mg Intravenous Q12H  . gabapentin  300 mg Oral QHS  . mouth rinse  15 mL Mouth Rinse BID  . multivitamin with minerals  1 tablet Oral Daily  . pantoprazole  40 mg Oral Q24H  . potassium chloride SA  20 mEq Oral Daily  . sodium chloride flush  3 mL Intravenous Q12H  . spironolactone  12.5 mg Oral Daily  . tamsulosin  0.4 mg Oral QPM  . traZODone  50 mg Oral QHS  . vitamin C  500 mg Oral Daily     Assessment/Plan: 83 year old male presenting with weakness noted to have 9mm acute external capsular hemorrhage on imaging.  Patient taken off Plavix and ASA.  Remains on statin.  Repeat imaging today shows a slight improvement.   Carotid dopplers show no evidence of hemodynamically significant stenosis.  Echocardiogram pending.    Recommendations: 1. Would restart ASA as of 6/3 at 81mg  daily. 2. A1c, fasting lipid panel 3. Echocardiogram pending 4. Continue therapy 5. Telemetry    LOS: 2 days   Thana FarrLeslie Roshan Salamon, MD Neurology [phone] 02/01/2019  1:35 PM

## 2019-02-01 NOTE — Evaluation (Signed)
Occupational Therapy Evaluation Patient Details Name: Dylan [name] MRN: [id number] DOB: [birth year] Today's Date: 02/01/2019    History of Present Illness Dylan [name] is an 83yo male who comes to [facility] on 5/30 for generalized weakness, somnolence, confusion for the past 2 days. Pt was found to have UTI. CTH was done pt with L 9mm acute external capsular hemorrhage on imaging. PMH: CAD, MI, COPD, Rt Foot Drop, Ct TKA 2019, Lt femur fracture (Dec 2019) s/p ORIF/quads repair (no longer under precautions). Pt has not successfully AMB since Dec, previously wa sminimally AMB at home, has recently been total care with 24/7 caregiver assistance.    Clinical Impression   Dylan [name] was seen for OT evaluation this date. Prior to hospital admission, pt required assistance from PCA for all ADLs at bed level or in his recliner. Requires a hoyer lift for transfers at home (pt states he has hoyer and "standing lift" at home). Pt lives with his spouse in a 1-level house with a ramped entrance.  Currently pt demonstrates impairments in activity tolerance, strength, endurance, and functional UE use requiring max/total assist for ADL and mobility. Pt instructed on PLB as an energy conservation strategy and was able to return verbalize/demonstrate with min VCs. Pt required reminders to utilize PLB during bed mobility. At time of evaluation pt requesting to use bedpan. OT assisted RN with bedpan placement and instructed pt in bed mobility techniques for rolling. Pt +2 mod/max assist for rolling on this date. AE catalog left with pt as he has endorsed difficulty with self-feeding 2/2 to BUE tremors. Pt and provider briefly discussed adapted cups/utensils for self-feeding. Pt plans to review catalog to discuss at next OT session. Pt would benefit from skilled OT to address noted impairments and functional limitations (see below for any additional details) in order to maximize safety and independence while minimizing falls risk and  caregiver burden.  Upon hospital discharge, recommend pt discharge to STR. .       Follow Up Recommendations  SNF    Equipment Recommendations  (TBD at next venue of care. )    Recommendations for Other Services       Precautions / Restrictions Precautions Precautions: Fall Restrictions Weight Bearing Restrictions: No      Mobility Bed Mobility Overal bed mobility: Needs Assistance Bed Mobility: Rolling Rolling: +2 for physical assistance;Max assist            Transfers Overall transfer level: Needs assistance               General transfer comment: Pt total assist for transfers. Uses hoyer lift at home.     Balance Overall balance assessment: History of Falls                                         ADL either performed or assessed with clinical judgement   ADL Overall ADL's : Needs assistance/impaired                                       General ADL Comments: Pt requires at least max assist at baseline for most ADLs. Reports his self-feeding has declined since hand tremors have increased. Discussed AE to support self-feeding and ECS with bed mobility to promote active participation in bed-level ADLs. Pt currently total assist for  bathing, toileting, LB dressing, transfers. Max assist for UB dressing. Can assist getting BUE into shirt/gown.      Vision Baseline Vision/History: Wears glasses Wears Glasses: Reading only Patient Visual Report: No change from baseline       Perception     Praxis      Pertinent Vitals/Pain Pain Assessment: 0-10 Pain Score: 6  Pain Location: Low back. Pain Descriptors / Indicators: Sore Pain Intervention(s): Limited activity within patient's tolerance;Monitored during session;Utilized relaxation techniques     Hand Dominance Right   Extremity/Trunk Assessment Upper Extremity Assessment Upper Extremity Assessment: Generalized weakness   Lower Extremity Assessment Lower  Extremity Assessment: Generalized weakness       Communication Communication Communication: No difficulties   Cognition Arousal/Alertness: Lethargic Behavior During Therapy: WFL for tasks assessed/performed Overall Cognitive Status: Within Functional Limits for tasks assessed                                 General Comments: Pt lethargic, but able to answer questions/follow conversation/participate in bed level toileting.    General Comments  Pt on bedpan at end of session. RN and nurse tech notified.     Exercises Other Exercises Other Exercises: Pt educated in ECS including PLB to promote increased activity tolerance and endurance during ADLs. Handout left in room with pt. Would benefit from reinforcement.  Other Exercises: Pt educated in safe use of AE for adls (catalog left in room with pages marked for adapted utensils as pt has difficulty with self-feeding 2/2 tremors), as well as strategies for bed mobility.   Shoulder Instructions      Home Living Family/patient expects to be discharged to:: Private residence Living Arrangements: Spouse/significant other Available Help at Discharge: Family;Neighbor;Personal care attendant;Other (Comment)(Pt stated he has round-the-clock care from PCAs. ) Type of Home: House Home Access: Ramped entrance     Home Layout: One level     Bathroom Shower/Tub: (Pt has sponge baths at bed-level. )   Bathroom Toilet: (Uses bedpan and urinal at bed level. )     Home Equipment: Walker - 2 wheels;Cane - single point;Bedside commode;Shower seat;Grab bars - tub/shower;Wheelchair - Careers advisermanual;Other (comment);Hospital bed(Hoyer lift, stander. )   Additional Comments: Pt has a hospital bed in the living room along with a hoyer lift that his PCA can assist him with ADLs. Wife takes care of cooking and IADLs      Prior Functioning/Environment Level of Independence: Needs assistance  Gait / Transfers Assistance Needed: Per patient, he has  not been able to stand up or ambulate since his L femur ORIF and quad repair December of 2019.  Pt requires a hoyer lift for transfers.  ADL's / Homemaking Assistance Needed: Total assistance with ADLs from his PCA   Comments: Pt has an Rt AFO for foot drop         OT Problem List: Decreased strength;Impaired balance (sitting and/or standing);Decreased cognition;Decreased range of motion;Decreased safety awareness;Cardiopulmonary status limiting activity;Obesity;Decreased activity tolerance;Decreased coordination;Decreased knowledge of use of DME or AE;Impaired UE functional use      OT Treatment/Interventions: Self-care/ADL training;Manual therapy;Therapeutic exercise;Modalities;Patient/family education;Neuromuscular education;Energy conservation;Therapeutic activities;DME and/or AE instruction;Cognitive remediation/compensation    OT Goals(Current goals can be found in the care plan section) Acute Rehab OT Goals Patient Stated Goal: To be more independent with ADLs OT Goal Formulation: With patient Time For Goal Achievement: 02/15/19 Potential to Achieve Goals: Good ADL Goals Pt Will Perform Eating: sitting;with adaptive  utensils;with modified independence(With LRAD for improved safety and functional independence.) Pt Will Perform Grooming: with min assist;sitting;with adaptive equipment(With LRAD for improved safety and functional independence.) Additional ADL Goal #1: Pt will independently verbalize a plan to implement at least 3 energy conservation strateiges into his daily routine for improved safety and independence upon hospital DC.  OT Frequency: Min 1X/week   Barriers to D/C:            Co-evaluation              AM-PAC OT "6 Clicks" Daily Activity     Outcome Measure Help from another person eating meals?: A Little Help from another person taking care of personal grooming?: A Lot Help from another person toileting, which includes using toliet, bedpan, or urinal?:  Total Help from another person bathing (including washing, rinsing, drying)?: Total Help from another person to put on and taking off regular upper body clothing?: A Lot Help from another person to put on and taking off regular lower body clothing?: Total 6 Click Score: 10   End of Session Equipment Utilized During Treatment: Oxygen Nurse Communication: Other (comment)(Pt still on bedpan at end of session. )  Activity Tolerance: Patient limited by lethargy;Patient limited by fatigue Patient left: in bed;with bed alarm set  OT Visit Diagnosis: Other abnormalities of gait and mobility (R26.89);History of falling (Z91.81);Muscle weakness (generalized) (M62.81)                Time: 6803-2122 OT Time Calculation (min): 23 min Charges:  OT General Charges $OT Visit: 1 Visit OT Evaluation $OT Eval Moderate Complexity: 1 Mod OT Treatments $Self Care/Home Management : 8-22 mins  Rockney Ghee, M.S., OTR/L Ascom: [phone] 02/01/19, 2:29 PM

## 2019-02-01 NOTE — Progress Notes (Addendum)
Spoke with patient's wife, Giovann Salahuddin, and son Truddie Hidden over the phone to give an update. Wife requested to speak to a doctor because she has not received any results from yesterday or today. Dr. Nemiah Commander notified.

## 2019-02-01 NOTE — Telephone Encounter (Signed)
Noticed that Dylan [name] was in hospital.  Spoke with wife today and she advised that Hospice did come out and did an evaluation last Friday but they were wanting only to do comfort measures and Dylan [name] refused.  Advised her I will watch and when he is discharged I will come out and visit with him, she is in agreement with that.  He also has [facility] that visits.   E[place]on [place] EMT-Paramedic [phone]

## 2019-02-01 NOTE — Plan of Care (Signed)
  Problem: Education: Goal: Ability to demonstrate management of disease process will improve Outcome: Progressing   Problem: Education: Goal: Knowledge of General Education information will improve Description Including pain rating scale, medication(s)/side effects and non-pharmacologic comfort measures Outcome: Progressing   Problem: Clinical Measurements: Goal: Ability to maintain clinical measurements within normal limits will improve Outcome: Progressing   Problem: Safety: Goal: Ability to remain free from injury will improve Outcome: Progressing   Problem: Skin Integrity: Goal: Risk for impaired skin integrity will decrease Outcome: Progressing

## 2019-02-01 NOTE — Progress Notes (Signed)
*  PRELIMINARY RESULTS* Echocardiogram 2D Echocardiogram has been performed.  Kipling, Lumbert 02/01/2019, 11:36 AM

## 2019-02-01 NOTE — Evaluation (Addendum)
Clinical/Bedside Swallow Evaluation Patient Details  Name: Dylan [name] MRN: [id number] Date of Birth: [birth year]  Today's Date: 02/01/2019 Time: SLP Start Time (ACUTE ONLY): 1140 SLP Stop Time (ACUTE ONLY): 1230 SLP Time Calculation (min) (ACUTE ONLY): 50 min  Past Medical History:  Past Medical History:  Diagnosis Date  . CAD (coronary artery disease)   . Cervicalgia   . CHF (congestive heart failure) (HCC)   . COPD (chronic obstructive pulmonary disease) (HCC)   . Diastolic heart failure (HCC)   . Foot drop, right   . History of kidney stones   . Hyperlipidemia    unspecified  . Hypertension   . Myocardial infarction (HCC)   . Osteoarthritis   . Shoulder pain, left   . Sleep apnea   . Tremor, essential    Past Surgical History:  Past Surgical History:  Procedure Laterality Date  . APPLICATION OF WOUND VAC Right 11/13/2017   Procedure: APPLICATION OF WOUND VAC;  Surgeon: Annice Needy, MD;  Location: [facility] ORS;  Service: Vascular;  Laterality: Right;  . BACK SURGERY  06/2010  . CARDIAC CATHETERIZATION Left 04/30/2016   Procedure: Left Heart Cath and Coronary Angiography;  Surgeon: Laurier Nancy, MD;  Location: [facility] INVASIVE CV LAB;  Service: Cardiovascular;  Laterality: Left;  . COLONOSCOPY    . CORONARY ANGIOPLASTY    . CORONARY/GRAFT ACUTE MI REVASCULARIZATION N/A 06/21/2018   Procedure: Coronary/Graft Acute MI Revascularization;  Surgeon: Iran Ouch, MD;  Location: [facility] IN[facility] CV [facility];  Service: Cardiovascular;  Laterality: N/A;  . KNEE ARTHROPLASTY Left 06/01/2018   Procedure: COMPUTER ASSISTED TOTAL KNEE ARTHROPLASTY;  Surgeon: Donato Heinz, MD;  Location: [facility] ORS;  Service: Orthopedics;  Laterality: Left;  . KNEE ARTHROSCOPY Left   . KNEE SURGERY Left 1998  . LEFT HEART CATH AND CORONARY ANGIOGRAPHY N/A 06/21/2018   Procedure: LEFT HEART CATH AND CORONARY ANGIOGRAPHY;  Surgeon: Iran Ouch, MD;  Location: [facility] IN[facility] CV [facility];  Service: Cardiovascular;   Laterality: N/A;  . ORIF FEMUR FRACTURE Left 08/27/2018   Procedure: OPEN REDUCTION INTERNAL FIXATION (ORIF) SUPRACONDYLAR FEMUR FRACTURE ABOVE PROSTHESIS, QUADRICEPS REPAIR;  Surgeon: Kennedy Bucker, MD;  Location: [facility] ORS;  Service: Orthopedics;  Laterality: Left;  . TEE WITHOUT CARDIOVERSION N/A 11/18/2018   Procedure: TRANSESOPHAGEAL ECHOCARDIOGRAM (TEE);  Surgeon: Lamar Blinks, MD;  Location: [facility] ORS;  Service: Cardiovascular;  Laterality: N/A;  . TONSILLECTOMY    . WOUND DEBRIDEMENT Right 11/13/2017   Procedure: DEBRIDEMENT WOUND;  Surgeon: Annice Needy, MD;  Location: [facility] ORS;  Service: Vascular;  Laterality: Right;   HPI:  Dylan [name] is an 83yo male who comes to [facility] on 5/30 for generalized weakness, somnolence, confusion for the past 2 days. Dylan [name] was found to have UTI. CTHead was done Dylan [name] with L 9mm acute external capsular hemorrhage on imaging. PMH: Multiple medical issues including CAD, MI, COPD, CHF, CAP, OSA, Obesity, Lt femur fracture (Dec 2019) s/p ORIF/quads repair (no longer under precautions). Dylan [name] has not successfully AMB since Dec, previously was minimally AMB at home, has recently been total care with 24/7 caregiver assistance. He/family reported "good" appetite prior to admission. CXR on 01/30/2019: Findings suggest congestive heart failure with cardiomegaly, pleural effusions and central venous congestion. Noted resting UE tremors - Dylan [name] stated baseline for ~10 yrs. Noted Palliative Care following at home per chart notes.    Assessment / Plan / Recommendation Clinical Impression  Dylan [name] appears to present w/ adequate oropharyngeal phase swallowing function w/ reduced risk for  aspiration when following general aspiration precautions. Dylan [name] required assistance positioning upright in bed and supporting head forward(pillow). He then consumed the trials of thin liquids via straw and soft solid trials mostly feeding self(min discoordination and UE tremors noted which he stated was BASELINE ~10 yrs). No  overt, clinical s/s of aspiration were noted during/post the po trials; no decline in vocal quality or respiratory status. During the oral phase, Dylan [name] appeared to adequately manage the po trials w/ grossly timely mastication and A-P transfer w/ the increased texture; appropriate bolus management of the thin liquids pulling from straw. No gross oral residue or anterior leakage noted w/ trials. OM exam revealed no unilateral OM weakness during bolus management; speech clear and Dylan [name] intelligible in wants/needs verbalized. Dylan [name] tolerated swallowing Pills Whole w/ water w/ NSG in room.  Recommend continue current diet w/ a more Mech Soft consistency w/ meats(cut, moist foods); assistance at all meals for tray setup/positioning to support self-feeding; general aspiration precautions; Pills in a puree IF needed for safer swallowing at this time. Must sit upright for any oral intake. Recommend Rest Breaks during meals to avoid any SOB/WOB. Recommend f/u w/ OT for potential needs for weighted utensils to aid w/ UE tremors. NSG to reconsult if any decline in status while admitted.  SLP Visit Diagnosis: Dysphagia, unspecified (R13.10)    Aspiration Risk  (reduced following general precautions)    Diet Recommendation  regular/Mech Soft food consistency(cut, moist foods) for energy conservation; thin liquids. General aspiration precautions; support at meals d/t UE tremors  Medication Administration: Whole meds with liquid(or in a puree for easier swallowing IF needed)    Other  Recommendations Recommended Consults: (Dietician f/u) Oral Care Recommendations: Oral care BID;Staff/trained caregiver to provide oral care Other Recommendations: OT assessment for potential use of adaptive equipment d/t BASELINE UE tremors to aid self-feeding  Follow up Recommendations None      Frequency and Duration (n/a)  (n/a)       Prognosis Prognosis for Safe Diet Advancement: Good Barriers to Reach Goals: (deconditioning)       Swallow Study   General Date of Onset: 01/30/19 HPI: Dylan [name] is an 83yo male who [facility]/30 for generalized weakness, somnolence, confusion for the past 2 days. Dylan [name] was found to have UTI. CTHead was done Dylan [name] with L 9mm acute external capsular hemorrhage on imaging. PMH: Multiple medical issues including CAD, MI, COPD, CHF, CAP, OSA, Obesity, Lt femur fracture (Dec 2019) s/p ORIF/quads repair (no longer under precautions). Dylan [name] has not successfully AMB since Dec, previously was minimally AMB at home, has recently been total care with 24/7 caregiver assistance. He/family reported "good" appetite prior to admission. CXR on 01/30/2019: Findings suggest congestive heart failure with cardiomegaly, pleural effusions and central venous congestion. Noted resting UE tremors - Dylan [name] stated baseline for ~10 yrs. Noted Palliative Care following at home per chart notes.  Type of Study: Bedside Swallow Evaluation Previous Swallow Assessment: none reported Diet Prior to this Study: Regular;Thin liquids Temperature Spikes Noted: No(11.4 on 01/30/2019) Respiratory Status: Nasal cannula(2 liters) History of Recent Intubation: No Behavior/Cognition: Alert;Cooperative;Pleasant mood Oral Cavity Assessment: Within Functional Limits Oral Care Completed by SLP: Recent completion by staff Oral Cavity - Dentition: Adequate natural dentition Vision: Functional for self-feeding Self-Feeding Abilities: Able to feed self;Needs assist;Needs set up Patient Positioning: Upright in bed(needed min A for positioning more upright) Baseline Vocal Quality: Normal Volitional Cough: Strong Volitional Swallow: Able to elicit    Oral/Motor/Sensory Function Overall Oral Motor/Sensory Function:  Within functional limits   Ice Chips Ice chips: Within functional limits Presentation: Spoon(fed; 2 trials)   Thin Liquid Thin Liquid: Within functional limits Presentation: Self Fed;Straw(7-8 sips) Other Comments: further sips w/ NSG to  swallowing Pills w/ water    Nectar Thick Nectar Thick Liquid: Not tested   Honey Thick Honey Thick Liquid: Not tested   Puree Puree: Within functional limits Presentation: Spoon;Self Fed(assisted; 3 trials)   Solid     Solid: Within functional limits(mech soft food/trials; 4 trials) Presentation: Spoon(fed)       Jerilynn Som, MS, CCC-SLP Watson,Katherine 02/01/2019,3:15 PM

## 2019-02-01 NOTE — Progress Notes (Addendum)
[facility] [facility] [place] [facility]   PATIENT NAME: Dylan [name]    MR#:  [id number]  DATE OF BIRTH:  08/03/1936  SUBJECTIVE:  CHIEF COMPLAINT:   Chief Complaint  Patient presents with   Weakness   -Patient admitted with CHF and noted to have acute stroke with hemorrhagic conversion. -Mental status is much improved today.  Use CPAP yesterday  REVIEW OF SYSTEMS:  Review of Systems  Constitutional: Positive for malaise/fatigue. Negative for chills and fever.  HENT: Negative for congestion, hearing loss and nosebleeds.   Eyes: Negative for blurred vision and double vision.  Respiratory: Positive for shortness of breath. Negative for cough and wheezing.   Cardiovascular: Positive for leg swelling. Negative for chest pain and palpitations.  Gastrointestinal: Negative for abdominal pain, constipation, diarrhea, nausea and vomiting.  Genitourinary: Negative for dysuria.  Musculoskeletal: Negative for myalgias.  Neurological: Negative for dizziness, focal weakness, seizures, weakness and headaches.  Psychiatric/Behavioral: Negative for depression.       Tired and sleepy    DRUG ALLERGIES:  No Known Allergies  VITALS:  Blood pressure 125/71, pulse 67, temperature 97.7 F (36.5 C), temperature source Oral, resp. rate 16, height  (1.778 m), weight 118.5 kg, SpO2 94 %.  PHYSICAL EXAMINATION:  Physical Exam  GENERAL:  83 y.o.-year-old obese patient lying in the bed with no acute distress.  EYES: Pupils equal, round, reactive to light and accommodation. No scleral icterus. Extraocular muscles intact.  HEENT: Head atraumatic, normocephalic. Oropharynx and nasopharynx clear.  NECK:  Supple, no jugular venous distention. No thyroid enlargement, no tenderness.  LUNGS: Normal breath sounds bilaterally, no wheezing, rales,rhonchi or crepitation. No use of accessory muscles of respiration. Decreased bibasilar breath sounds CARDIOVASCULAR: S1, S2 normal. No murmurs,  rubs, or gallops.  ABDOMEN: Soft, obese, nontender, nondistended. Bowel sounds present. No organomegaly or mass.  EXTREMITIES: No pedal edema, cyanosis, or clubbing.  However chronic right toe dorsal chronic osteomyelitis wound with active bleeding Tremors of both hands noted which is chronic NEUROLOGIC: Cranial nerves II through XII are intact.  Alert,  Muscle strength equal in all extremities. Sensation intact. Gait not checked.  PSYCHIATRIC: The patient is alert and oriented x3 SKIN: No obvious rash, lesion, or ulcer.        LABORATORY PANEL:   CBC Recent Labs  Lab 01/30/19 2352  WBC 11.4*  HGB 11.3*  HCT 36.9*  PLT 191   ------------------------------------------------------------------------------------------------------------------  Chemistries  Recent Labs  Lab 01/30/19 1404 01/30/19 2352 02/01/19 0545  NA 144 144 142  K 4.6 4.1 4.0  CL 99 99 99  CO2 36* 36* 36*  GLUCOSE 168* 116* 99  BUN 39* 40* 37*  CREATININE 1.15 1.05 0.96  CALCIUM 9.0 8.6* 8.4*  MG  --  2.2  --   AST 49*  --   --   ALT 50*  --   --   ALKPHOS 151*  --   --   BILITOT 1.0  --   --    ------------------------------------------------------------------------------------------------------------------  Cardiac Enzymes Recent Labs  Lab 01/31/19 0611  TROPONINI 0.08*   ------------------------------------------------------------------------------------------------------------------  RADIOLOGY:  Ct Head Wo Contrast  Result Date: 01/31/2019 CLINICAL DATA:  Mental status changes this morning. Unable to answer questions. EXAM: CT HEAD WITHOUT CONTRAST TECHNIQUE: Contiguous axial images were obtained from the base of the skull through the vertex without intravenous contrast. COMPARISON:  11/30/2018 FINDINGS: Brain: Generalized brain atrophy. Chronic small-vessel ischemic changes of the pons. Old small vessel infarctions of  the inferior cerebellum on the left. Cerebral hemispheres show a  background pattern of extensive chronic small-vessel ischemic change throughout the deep and subcortical white matter. Old lacunar infarctions in the basal ganglia and thalami. There is an acute small vessel infarction in the external capsule region of the deep insula on the left with associated hemorrhage. This measures 0.9 x 0.5 x 0.7 cm (volume = 0.2 cm^3). No evidence of mass lesion, hydrocephalus or extra-axial collection. Vascular: There is atherosclerotic calcification of the major vessels at the base of the brain. Skull: Negative Sinuses/Orbits: Clear/normal Other: None IMPRESSION: Acute lacunar infarction of the external capsule of the subinsular brain on the left with associated hemorrhage. This measures only 9 x 5 x 7 mm. No mass effect. Extensive atrophy and chronic small-vessel ischemic changes elsewhere throughout the brain. Call report in progress. Electronically Signed   By: Paulina Fusi M.D.   On: 01/31/2019 10:35   Mr [name] Head Wo Contrast  Result Date: 01/31/2019 CLINICAL DATA:  Generalized weakness and somnolence with confusion EXAM: MRI HEAD WITHOUT CONTRAST MRA HEAD WITHOUT CONTRAST TECHNIQUE: Multiplanar, multiecho pulse sequences of the brain and surrounding structures were obtained without intravenous contrast. Angiographic images of the head were obtained using MRA technique without contrast. COMPARISON:  Head CT from earlier today FINDINGS: MRI HEAD FINDINGS Brain: Known acute hemorrhage in the left external capsule measuring up to 9 x 5 mm by CT. Diffusion in this area is complicated by paramagnetic effects; no large underlying infarct. No acute infarct elsewhere. There is a background of moderate or advanced generalized atrophy. Advanced chronic small vessel ischemia with confluent gliosis in the periventricular white matter and remote lacunar infarcts in the deep gray nuclei. Small remote left cerebellar infarcts. No hydrocephalus or masslike finding. Vascular: Arterial findings  below. Normal dural venous sinus flow voids. Skull and upper cervical spine: Negative for marrow lesion Sinuses/Orbits: Mild mucosal thickening and secretions in the sphenoid sinuses. MRA HEAD FINDINGS Effectively no flow seen in the left vertebral artery. The left PICA is patent. No intracranial proximal flow limiting stenosis or major branch occlusion. There is atherosclerotic irregularity of bilateral medium size vessels, most notable at the P3 segments. Negative for aneurysm. IMPRESSION: Brain MRI: 1. 9 mm acute hemorrhage in the left external capsule. No enlargement since prior CT. No large underlying infarct. 2. Advanced chronic small vessel ischemia with generalized cerebral atrophy. Intracranial MRA: Intracranial atherosclerosis without proximal flow limiting stenosis. Electronically Signed   By: Marnee Spring M.D.   On: 01/31/2019 17:27   Mr Brain Wo Contrast  Result Date: 01/31/2019 CLINICAL DATA:  Generalized weakness and somnolence with confusion EXAM: MRI HEAD WITHOUT CONTRAST MRA HEAD WITHOUT CONTRAST TECHNIQUE: Multiplanar, multiecho pulse sequences of the brain and surrounding structures were obtained without intravenous contrast. Angiographic images of the head were obtained using MRA technique without contrast. COMPARISON:  Head CT from earlier today FINDINGS: MRI HEAD FINDINGS Brain: Known acute hemorrhage in the left external capsule measuring up to 9 x 5 mm by CT. Diffusion in this area is complicated by paramagnetic effects; no large underlying infarct. No acute infarct elsewhere. There is a background of moderate or advanced generalized atrophy. Advanced chronic small vessel ischemia with confluent gliosis in the periventricular white matter and remote lacunar infarcts in the deep gray nuclei. Small remote left cerebellar infarcts. No hydrocephalus or masslike finding. Vascular: Arterial findings below. Normal dural venous sinus flow voids. Skull and upper cervical spine: Negative for  marrow lesion Sinuses/Orbits: Mild mucosal  thickening and secretions in the sphenoid sinuses. MRA HEAD FINDINGS Effectively no flow seen in the left vertebral artery. The left PICA is patent. No intracranial proximal flow limiting stenosis or major branch occlusion. There is atherosclerotic irregularity of bilateral medium size vessels, most notable at the P3 segments. Negative for aneurysm. IMPRESSION: Brain MRI: 1. 9 mm acute hemorrhage in the left external capsule. No enlargement since prior CT. No large underlying infarct. 2. Advanced chronic small vessel ischemia with generalized cerebral atrophy. Intracranial MRA: Intracranial atherosclerosis without proximal flow limiting stenosis. Electronically Signed   By: Marnee SpringJonathon  Watts M.D.   On: 01/31/2019 17:27   [place] Carotid Bilateral  Result Date: 01/31/2019 CLINICAL DATA:  Stroke EXAM: BILATERAL CAROTID DUPLEX ULTRASOUND TECHNIQUE: Wallace CullensGray scale imaging, color Doppler and duplex ultrasound were performed of bilateral carotid and vertebral arteries in the neck. COMPARISON:  None. FINDINGS: Criteria: Quantification of carotid stenosis is based on velocity parameters that correlate the residual internal carotid diameter with NASCET-based stenosis levels, using the diameter of the distal internal carotid lumen as the denominator for stenosis measurement. The following velocity measurements were obtained: RIGHT ICA: 75 cm/sec CCA: 43 cm/sec SYSTOLIC ICA/CCA RATIO:  1.7 ECA: 57 cm/sec LEFT ICA: 75 cm/sec CCA: 34 cm/sec SYSTOLIC ICA/CCA RATIO:  2.2 ECA: 40 cm/sec RIGHT CAROTID ARTERY: Mild focal mixed plaque in the bulb. Low resistance internal carotid Doppler pattern is preserved. RIGHT VERTEBRAL ARTERY:  Antegrade. LEFT CAROTID ARTERY: There is mild scattered calcified plaque along the upper common carotid artery and bulb. Low resistance internal carotid Doppler pattern is preserved. LEFT VERTEBRAL ARTERY:  Antegrade. IMPRESSION: Less than 50% stenosis in the right and  left internal carotid arteries. Electronically Signed   By: Jolaine ClickArthur  Hoss M.D.   On: 01/31/2019 12:32   Dg Chest Port 1 View  Result Date: 01/30/2019 CLINICAL DATA:  Increased weakness.  Sleeping for several days. EXAM: PORTABLE CHEST 1 VIEW COMPARISON:  Radiograph 11/30/2018 FINDINGS: Stable enlarged cardiac silhouette. Low lung volumes. Bilateral pleural effusions. Central venous congestion. No focal consolidation no pneumothorax. IMPRESSION: Findings suggest congestive heart failure with cardiomegaly, pleural effusions and central venous congestion. Electronically Signed   By: Genevive BiStewart  Edmunds M.D.   On: 01/30/2019 15:11    EKG:   Orders placed or performed during the hospital encounter of 01/30/19   ED EKG   ED EKG   EKG 12-Lead   EKG 12-Lead    ASSESSMENT AND PLAN:   83 year old male with past medical history significant for CAD, COPD on chronic oxygen, hypertension presenting to hospital secondary to worsening shortness of breath.  1.  Acute lacunar stroke with hemorrhagic conversion-noted on CT head done for altered mental status -Much improved mental status now.  Aspirin and Plavix held.  Appreciate neurology consult. -Continue stroke work-up, PT/OT and speech therapy consults -Patient is at his baseline at this time.  Repeat CT head today to see any increase in hemorrhage.  If stable, restart aspirin tomorrow  2.  Acute on chronic systolic CHF exacerbation-echocardiogram from March 2020 with known EF of 40 to 45%. -Admitted for IV Lasix, continue daily weights and strict input and output monitoring. -Uses chronic home oxygen, currently on 2 L. -Hold Coreg, Entresto and Aldactone due to hypotension and acute stroke  3.  UTI-cultures have been sent for.   Prior history of resistant Klebsiella in December 2019.  Currently on meropenem.  4.  Obstructive sleep apnea-will need CPAP at bedtime  5.  DVT prophylaxis-teds and SCDs only  6.  Chronic right toe  osteomyelitis-appreciate podiatry consult.  Continue to use Augmentin for now. -Due to acute stroke, not a surgical candidate at this time.  Outpatient follow-up.  Patient has open wound and has history of easy bleeding from the wound.  Daily dressing changes recommended  Wife was updated over the phone yesterday Since his fall and left knee surgery in December 2019, has not been ambulating much.  He has a Nurse, adult at home   All the records are reviewed and case discussed with Care Management/Social Workerr. Management plans discussed with the patient, family and they are in agreement.  CODE STATUS: Full code  TOTAL  TIME SPENT TAKING CARE OF THIS PATIENT: 39 minutes.   POSSIBLE D/C IN 2-3 DAYS, DEPENDING ON CLINICAL CONDITION.   Enid Baas M.D on 02/01/2019 at 10:33 AM  Between 7am to 6pm - Pager - [phone]  After 6pm go to [email or web address] - Social research officer, government  [facility]  Office  [phone]  CC: Primary care physician; [name], MD

## 2019-02-01 NOTE — Progress Notes (Signed)
Please note, patient is currently followed by [facility] outpatient Palliative at home. CMRN Zerita Boers made aware.  Dayna Barker BSN, RN, [facility] [employer] [phone]

## 2019-02-02 LAB — LIPID PANEL
Cholesterol: 92 mg/dL (ref 0–200)
HDL: 34 mg/dL — ABNORMAL LOW (ref >40)
LDL Cholesterol: 48 mg/dL (ref 0–99)
Total CHOL/HDL Ratio: 2.7 RATIO
Triglycerides: 52 mg/dL (ref <150)
VLDL: 10 mg/dL (ref 0–40)

## 2019-02-02 LAB — BASIC METABOLIC PANEL
Anion gap: 8 (ref 5–15)
BUN: 41 mg/dL — ABNORMAL HIGH (ref 8–23)
CO2: 37 mmol/L — ABNORMAL HIGH (ref 22–32)
Calcium: 8.3 mg/dL — ABNORMAL LOW (ref 8.9–10.3)
Chloride: 97 mmol/L — ABNORMAL LOW (ref 98–111)
Creatinine, Ser: 1.09 mg/dL (ref 0.61–1.24)
GFR calc Af Amer: >60 mL/min (ref >60)
GFR calc non Af Amer: >60 mL/min (ref >60)
Glucose, Bld: 96 mg/dL (ref 70–99)
Potassium: 4.1 mmol/L (ref 3.5–5.1)
Sodium: 142 mmol/L (ref 135–145)

## 2019-02-02 LAB — URINE CULTURE: Culture: >=100000 — AB

## 2019-02-02 LAB — HEMOGLOBIN A1C
Hgb A1c MFr Bld: 5.4 % (ref 4.8–5.6)
Mean Plasma Glucose: 108.28 mg/dL

## 2019-02-02 MED ORDER — SODIUM CHLORIDE 0.9 % IV SOLN
2.0000 g | INTRAVENOUS | Status: DC
  Administered 2019-02-02 – 2019-02-03 (×2): 2 g via INTRAVENOUS
  Filled 2019-02-02 (×2): qty 2

## 2019-02-02 MED ORDER — ASPIRIN 81 MG PO CHEW
81.0000 mg | CHEWABLE_TABLET | Freq: Every day | ORAL | Status: DC
  Administered 2019-02-03: 81 mg via ORAL
  Filled 2019-02-02: qty 1

## 2019-02-02 MED ORDER — MUPIROCIN 2 % EX OINT
TOPICAL_OINTMENT | Freq: Two times a day (BID) | CUTANEOUS | Status: DC
  Administered 2019-02-02 – 2019-02-03 (×3): via NASAL
  Filled 2019-02-02: qty 22

## 2019-02-02 MED ORDER — METRONIDAZOLE 500 MG PO TABS
500.0000 mg | ORAL_TABLET | Freq: Three times a day (TID) | ORAL | Status: DC
  Administered 2019-02-02 – 2019-02-03 (×4): 500 mg via ORAL
  Filled 2019-02-02 (×6): qty 1

## 2019-02-02 NOTE — Progress Notes (Signed)
Patient on meropenem and Augmentin. Urine culture with Providencia susceptible to ceftriaxone. Spoke with Dr. Nemiah Commander. Will de-escalate meropenem to ceftriaxone and add metronidazole. Will hold Augmentin for now as ceftriaxone + metronidazole will provide similar coverage and avoid patient on two beta-lactam antibiotics. Augmentin to continue when ceftriaxone discontinued for treatment of UTI.  Pricilla Riffle, PharmD Pharmacy Resident  02/02/2019 8:19 AM

## 2019-02-02 NOTE — TOC Progression Note (Addendum)
Transition of Care [facility]) - Progression Note    Patient Details  Name: Dylan [name] MRN: [id number] Date of Birth: [birth year]  [facility]) CM/SW Contact  Sherren Kerns, RN Phone Number: 02/02/2019, 1:45 PM  Clinical Narrative:   Sherron Monday with [name], spouse.  He has used [facility] in the past and would like to stay with them.  Notified [name] of probable DC tomorrow.  Patient needs a wheelchair as the one he has at home is too small.  Spouse is requesting a bariatric wheelchair but patient does not qualify fro that per [facility] with [employer].  He will deliver the appropriate size wheelchair.  Followed by out patient palliative; Dayna Barker aware of plans to DC tomorrow.      Expected Discharge Plan: Home w Home Health Services Barriers to Discharge: Continued Medical Work up  Expected Discharge Plan and Services Expected Discharge Plan: Home w Home Health Services   Discharge Planning Services: CM Consult Post Acute Care Choice: Resumption of Svcs/PTA Provider Living arrangements for the past 2 months: Single Family Home                           HH Arranged: RN, PT HH Agency: [facility]) Date HH Agency Contacted: 01/31/19 Time HH Agency Contacted: 1001 Representative spoke with at [facility] Agency: [name]   Social Determinants of Health (SDOH) Interventions    Readmission Risk Interventions Readmission Risk Prevention Plan 01/31/2019 11/17/2018 11/17/2018  Transportation Screening - - -  Medication Review (RN Care Manager) Complete - -  PCP or Specialist appointment within 3-5 days of discharge - Complete Patient refused  PCP/Specialist Appt Not Complete comments - - wife is nurse; Dr. Arlana Pouch agrees to sign orders without seeing patient per Jasmine December at [employer] office; wife agrees to make appt.  HRI or Home Care Consult - - (No Data)  SW Recovery Care/Counseling Consult - - -  Palliative Care Screening - - -  Skilled Nursing Facility - - -   SNF Comments - - -  Some recent data might be hidden

## 2019-02-02 NOTE — TOC Progression Note (Signed)
Transition of Care [facility]) - Progression Note    Patient Details  Name: Dylan [name] MRN: [id number] Date of Birth: 1936/02/08  Transition of Care [facility]) CM/SW Contact  Sherren Kerns, RN Phone Number: 02/02/2019, 1:27 PM  Clinical Narrative:   Patient suffers from stroke and arthritis which impairs their ability to perform daily activities like walking in the home. A walker will not resolve  issue with performing activities of daily living. A wheelchair will allow patient to safely perform daily activities. Patient is not able to propel themselves in the home using a standard weight wheelchair due to weight. Patient can self propel in the lightweight wheelchair. Length of need: 1 year. Accessories: elevating leg rests (ELRs), wheel locks, extensions and anti-tippers.     Expected Discharge Plan: Home w Home Health Services Barriers to Discharge: Continued Medical Work up  Expected Discharge Plan and Services Expected Discharge Plan: Home w Home Health Services   Discharge Planning Services: CM Consult Post Acute Care Choice: Resumption of Svcs/PTA Provider Living arrangements for the past 2 months: Single Family Home                           HH Arranged: RN, PT HH Agency: [facility] Date HH Agency Contacted: 01/31/19 Time HH Agency Contacted: 1001 Representative spoke with at [facility] Agency: [name]   Social Determinants of Health (SDOH) Interventions    Readmission Risk Interventions Readmission Risk Prevention Plan 01/31/2019 11/17/2018 11/17/2018  Transportation Screening - - -  Medication Review (RN Care Manager) Complete - -  PCP or Specialist appointment within 3-5 days of discharge - Complete Patient refused  PCP/Specialist Appt Not Complete comments - - wife is nurse; Dr. Arlana Pouch agrees to sign orders without seeing patient per Jasmine December at [employer] office; wife agrees to make appt.  HRI or Home Care Consult - - (No Data)  SW Recovery  Care/Counseling Consult - - -  Palliative Care Screening - - -  Skilled Nursing Facility - - -  SNF Comments - - -  Some recent data might be hidden

## 2019-02-02 NOTE — Care Management (Signed)
Severe COPD and end stage emphysema; chronic respiratory failure with hypoxemia.  Patient continues to exhibit signs of hypercapnia associated with chronic respiratory failure secondary to severe COPD.  Patient requires the use of NIV both QHS and daytime to help with exacerbation periods.  The use of NIV will treat the patient's high PCO2 levels and can reduce risk of exacerbations and future hospitalizations when used at night and during the day.  Patient will need these advanced settings in conjunction with his current medication regimen; BIPAP with backup rate has been tried and failed.  Failure to have NIV available for use over a 24 hr period could lead to death.  Patient has been able to protect their airway and clear their own secretions..  Dickson Kostelnik, RNCM  [phone]  

## 2019-02-02 NOTE — Progress Notes (Addendum)
[facility] - [place] at [facility]   PATIENT NAME: Dylan [name]    MR#:  [id number]  DATE OF BIRTH:  Mar 30, 1936  SUBJECTIVE:  CHIEF COMPLAINT:   Chief Complaint  Patient presents with   Weakness   - doing well today, feels tired in the morning.  Unable to keep the CPAP on due to having a beard on his face. -CT head shows improvement in his hemorrhagic infarct  REVIEW OF SYSTEMS:  Review of Systems  Constitutional: Positive for malaise/fatigue. Negative for chills and fever.  HENT: Negative for congestion, hearing loss and nosebleeds.   Eyes: Negative for blurred vision and double vision.  Respiratory: Positive for shortness of breath. Negative for cough and wheezing.   Cardiovascular: Positive for leg swelling. Negative for chest pain and palpitations.  Gastrointestinal: Negative for abdominal pain, constipation, diarrhea, nausea and vomiting.  Genitourinary: Negative for dysuria.  Musculoskeletal: Negative for myalgias.  Neurological: Negative for dizziness, focal weakness, seizures, weakness and headaches.  Psychiatric/Behavioral: Negative for depression.       Tired and sleepy    DRUG ALLERGIES:  No Known Allergies  VITALS:  Blood pressure 130/76, pulse 72, temperature 97.8 F (36.6 C), temperature source Oral, resp. rate 16, height 5\' 10"  (1.778 m), weight 119.6 kg, SpO2 100 %.  PHYSICAL EXAMINATION:  Physical Exam  GENERAL:  83 y.o.-year-old obese patient lying in the bed with no acute distress.  EYES: Pupils equal, round, reactive to light and accommodation. No scleral icterus. Extraocular muscles intact.  HEENT: Head atraumatic, normocephalic. Oropharynx and nasopharynx clear.  NECK:  Supple, no jugular venous distention. No thyroid enlargement, no tenderness.  LUNGS: Normal breath sounds bilaterally, no wheezing, rales,rhonchi or crepitation. No use of accessory muscles of respiration. Decreased bibasilar breath sounds CARDIOVASCULAR: S1, S2  normal. No murmurs, rubs, or gallops.  ABDOMEN: Soft, obese, nontender, nondistended. Bowel sounds present. No organomegaly or mass.  EXTREMITIES: No pedal edema, cyanosis, or clubbing.  However chronic right toe dorsal chronic osteomyelitis wound with active bleeding Tremors of both hands noted which is chronic NEUROLOGIC: Cranial nerves II through XII are intact.  Alert,  Muscle strength equal in all extremities. Sensation intact. Gait not checked.  PSYCHIATRIC: The patient is alert and oriented x3 SKIN: No obvious rash, lesion, or ulcer.        LABORATORY PANEL:   CBC Recent Labs  Lab 01/30/19 2352  WBC 11.4*  HGB 11.3*  HCT 36.9*  PLT 191   ------------------------------------------------------------------------------------------------------------------  Chemistries  Recent Labs  Lab 01/30/19 1404 01/30/19 2352  02/02/19 0336  NA 144 144   < > 142  K 4.6 4.1   < > 4.1  CL 99 99   < > 97*  CO2 36* 36*   < > 37*  GLUCOSE 168* 116*   < > 96  BUN 39* 40*   < > 41*  CREATININE 1.15 1.05   < > 1.09  CALCIUM 9.0 8.6*   < > 8.3*  MG  --  2.2  --   --   AST 49*  --   --   --   ALT 50*  --   --   --   ALKPHOS 151*  --   --   --   BILITOT 1.0  --   --   --    < > = values in this interval not displayed.   ------------------------------------------------------------------------------------------------------------------  Cardiac Enzymes Recent Labs  Lab 01/31/19 0611  TROPONINI 0.08*   ------------------------------------------------------------------------------------------------------------------  RADIOLOGY:  Ct Head Wo Contrast  Result Date: 02/01/2019 CLINICAL DATA:  Follow up acute hemorrhagic stroke in the left external capsule. Improving mental status. EXAM: CT HEAD WITHOUT CONTRAST TECHNIQUE: Contiguous axial images were obtained from the base of the skull through the vertex without intravenous contrast. COMPARISON:  CT head 01/31/2019 and 11/30/2018.  MRI  01/31/2019. FINDINGS: Brain: Small focus of acute hemorrhage in the left external capsule has mildly improved compared with the CT from yesterday (image 18/2). There are no new areas of acute intracranial hemorrhage. There is no extra-axial fluid collection, midline shift or hydrocephalus. There is generalized atrophy with prominence of the ventricles and subarachnoid spaces. Extensive small vessel ischemic changes are present within the periventricular white matter and basal ganglia bilaterally. There is no CT evidence of acute cortical infarction. Vascular: Prominent intracranial vascular calcifications. No hyperdense vessel identified. Skull: Negative for fracture or focal lesion. Sinuses/Orbits: The visualized paranasal sinuses and mastoid air cells are clear. No orbital abnormalities are seen. Other: None. IMPRESSION: 1. Slight improvement in the focal hemorrhage in the left external capsule compared with yesterday's CT. No progressive mass effect or new hemorrhage identified. 2. Otherwise stable extensive chronic small vessel ischemic changes. Electronically Signed   By: Carey Bullocks M.D.   On: 02/01/2019 12:55   Mr [name] Head Wo Contrast  Result Date: 01/31/2019 CLINICAL DATA:  Generalized weakness and somnolence with confusion EXAM: MRI HEAD WITHOUT CONTRAST MRA HEAD WITHOUT CONTRAST TECHNIQUE: Multiplanar, multiecho pulse sequences of the brain and surrounding structures were obtained without intravenous contrast. Angiographic images of the head were obtained using MRA technique without contrast. COMPARISON:  Head CT from earlier today FINDINGS: MRI HEAD FINDINGS Brain: Known acute hemorrhage in the left external capsule measuring up to 9 x 5 mm by CT. Diffusion in this area is complicated by paramagnetic effects; no large underlying infarct. No acute infarct elsewhere. There is a background of moderate or advanced generalized atrophy. Advanced chronic small vessel ischemia with confluent gliosis in  the periventricular white matter and remote lacunar infarcts in the deep gray nuclei. Small remote left cerebellar infarcts. No hydrocephalus or masslike finding. Vascular: Arterial findings below. Normal dural venous sinus flow voids. Skull and upper cervical spine: Negative for marrow lesion Sinuses/Orbits: Mild mucosal thickening and secretions in the sphenoid sinuses. MRA HEAD FINDINGS Effectively no flow seen in the left vertebral artery. The left PICA is patent. No intracranial proximal flow limiting stenosis or major branch occlusion. There is atherosclerotic irregularity of bilateral medium size vessels, most notable at the P3 segments. Negative for aneurysm. IMPRESSION: Brain MRI: 1. 9 mm acute hemorrhage in the left external capsule. No enlargement since prior CT. No large underlying infarct. 2. Advanced chronic small vessel ischemia with generalized cerebral atrophy. Intracranial MRA: Intracranial atherosclerosis without proximal flow limiting stenosis. Electronically Signed   By: Marnee Spring M.D.   On: 01/31/2019 17:27   Mr Brain Wo Contrast  Result Date: 01/31/2019 CLINICAL DATA:  Generalized weakness and somnolence with confusion EXAM: MRI HEAD WITHOUT CONTRAST MRA HEAD WITHOUT CONTRAST TECHNIQUE: Multiplanar, multiecho pulse sequences of the brain and surrounding structures were obtained without intravenous contrast. Angiographic images of the head were obtained using MRA technique without contrast. COMPARISON:  Head CT from earlier today FINDINGS: MRI HEAD FINDINGS Brain: Known acute hemorrhage in the left external capsule measuring up to 9 x 5 mm by CT. Diffusion in this area is complicated by paramagnetic effects; no large underlying infarct. No acute infarct elsewhere. There is  a background of moderate or advanced generalized atrophy. Advanced chronic small vessel ischemia with confluent gliosis in the periventricular white matter and remote lacunar infarcts in the deep gray nuclei. Small  remote left cerebellar infarcts. No hydrocephalus or masslike finding. Vascular: Arterial findings below. Normal dural venous sinus flow voids. Skull and upper cervical spine: Negative for marrow lesion Sinuses/Orbits: Mild mucosal thickening and secretions in the sphenoid sinuses. MRA HEAD FINDINGS Effectively no flow seen in the left vertebral artery. The left PICA is patent. No intracranial proximal flow limiting stenosis or major branch occlusion. There is atherosclerotic irregularity of bilateral medium size vessels, most notable at the P3 segments. Negative for aneurysm. IMPRESSION: Brain MRI: 1. 9 mm acute hemorrhage in the left external capsule. No enlargement since prior CT. No large underlying infarct. 2. Advanced chronic small vessel ischemia with generalized cerebral atrophy. Intracranial MRA: Intracranial atherosclerosis without proximal flow limiting stenosis. Electronically Signed   By: Marnee Spring M.D.   On: 01/31/2019 17:27   US Carotid Bilateral  Result Date: 01/31/2019 CLINICAL DATA:  Stroke EXAM: BILATERAL CAROTID DUPLEX ULTRASOUND TECHNIQUE: Wallace Cullens scale imaging, color Doppler and duplex ultrasound were performed of bilateral carotid and vertebral arteries in the neck. COMPARISON:  None. FINDINGS: Criteria: Quantification of carotid stenosis is based on velocity parameters that correlate the residual internal carotid diameter with NASCET-based stenosis levels, using the diameter of the distal internal carotid lumen as the denominator for stenosis measurement. The following velocity measurements were obtained: RIGHT ICA: 75 cm/sec CCA: 43 cm/sec SYSTOLIC ICA/CCA RATIO:  1.7 ECA: 57 cm/sec LEFT ICA: 75 cm/sec CCA: 34 cm/sec SYSTOLIC ICA/CCA RATIO:  2.2 ECA: 40 cm/sec RIGHT CAROTID ARTERY: Mild focal mixed plaque in the bulb. Low resistance internal carotid Doppler pattern is preserved. RIGHT VERTEBRAL ARTERY:  Antegrade. LEFT CAROTID ARTERY: There is mild scattered calcified plaque along the  upper common carotid artery and bulb. Low resistance internal carotid Doppler pattern is preserved. LEFT VERTEBRAL ARTERY:  Antegrade. IMPRESSION: Less than 50% stenosis in the right and left internal carotid arteries. Electronically Signed   By: Jolaine Click M.D.   On: 01/31/2019 12:32    EKG:   Orders placed or performed during the hospital encounter of 01/30/19   ED EKG   ED EKG   EKG 12-Lead   EKG 12-Lead    ASSESSMENT AND PLAN:   83 year old male with past medical history significant for CAD, COPD on chronic oxygen, hypertension presenting to hospital secondary to worsening shortness of breath.  1.  Acute lacunar stroke with hemorrhagic conversion-noted on CT head done for altered mental status -MRI confirmed the 9 mm acute lacunar infarct in the left external capsule.  No focal deficits. -repeat CT of the head with improvement -Aspirin can be started from tomorrow.  Appreciate neurology consult.  Continue to hold Plavix.  Mental status is improved and close to baseline. -PT OT and speech therapy consults appreciated.  Patient will go home with home health.  At baseline he is wheelchair-bound.  2.  Acute on chronic systolic CHF exacerbation-echocardiogram from March 2020 with known EF of 40 to 45%. -Admitted for IV Lasix, continue daily weights and strict input and output monitoring. -Uses chronic home oxygen, currently on 2 L. -Hold Coreg, Entresto and Aldactone due to hypotension and acute stroke -Change to oral Lasix tomorrow  3.  UTI-cultures have been sent for.   Prior history of resistant Klebsiella in December 2019.  Currently on Rocephin.  4.  Chronic respiratory failure- on home  oxygen, from COPD - also has Obstructive sleep apnea- will benefit from non-invasive ventilation at bedtime  5.  DVT prophylaxis-teds and SCDs only  6.  Chronic right toe osteomyelitis-appreciate podiatry consult.  Was on Augmentin as outpatient, now that he is on Rocephin in the hospital  for UTI, add Flagyl for anaerobic coverage.  Resume Augmentin at discharge.  -Due to acute stroke, not a surgical candidate at this time.  Outpatient follow-up.  Patient has open wound and has history of easy bleeding from the wound.  Daily dressing changes recommended  Wife was updated over the phone yesterday, left a voicemail for today. Since his fall and left knee surgery in December 2019, has not been ambulating much.  He has a Nurse, adult at home Possible discharge home tomorrow   All the records are reviewed and case discussed with Care Management/Social Workerr. Management plans discussed with the patient, family and they are in agreement.  CODE STATUS: Full code  TOTAL  TIME SPENT TAKING CARE OF THIS PATIENT: 36 minutes.   POSSIBLE D/C tomorrow, DEPENDING ON CLINICAL CONDITION.   Enid Baas M.D on 02/02/2019 at 10:12 AM  Between 7am to 6pm - Pager - [phone]  After 6pm go to [email or web address] - Social research officer, government  [facility] [facility]  Office  [phone]  CC: Primary care physician; [name], MD

## 2019-02-02 NOTE — Progress Notes (Signed)
Updated patient's wife, [name], over the phone.

## 2019-02-03 DIAGNOSIS — I639 Cerebral infarction, unspecified: Secondary | ICD-10-CM

## 2019-02-03 LAB — CBC
HCT: 33.8 % — ABNORMAL LOW (ref 39.0–52.0)
Hemoglobin: 10.3 g/dL — ABNORMAL LOW (ref 13.0–17.0)
MCH: 30.4 pg (ref 26.0–34.0)
MCHC: 30.5 g/dL (ref 30.0–36.0)
MCV: 99.7 fL (ref 80.0–100.0)
Platelets: 169 10*3/uL (ref 150–400)
RBC: 3.39 MIL/uL — ABNORMAL LOW (ref 4.22–5.81)
RDW: 18.4 % — ABNORMAL HIGH (ref 11.5–15.5)
WBC: 7.8 10*3/uL (ref 4.0–10.5)
nRBC: 0 % (ref 0.0–0.2)

## 2019-02-03 LAB — BASIC METABOLIC PANEL
Anion gap: 9 (ref 5–15)
BUN: 39 mg/dL — ABNORMAL HIGH (ref 8–23)
CO2: 32 mmol/L (ref 22–32)
Calcium: 7.9 mg/dL — ABNORMAL LOW (ref 8.9–10.3)
Chloride: 98 mmol/L (ref 98–111)
Creatinine, Ser: 0.93 mg/dL (ref 0.61–1.24)
GFR calc Af Amer: >60 mL/min (ref >60)
GFR calc non Af Amer: >60 mL/min (ref >60)
Glucose, Bld: 94 mg/dL (ref 70–99)
Potassium: 3.7 mmol/L (ref 3.5–5.1)
Sodium: 139 mmol/L (ref 135–145)

## 2019-02-03 MED ORDER — AMOXICILLIN-POT CLAVULANATE 875-125 MG PO TABS: 1.0000 | ORAL_TABLET | Freq: Two times a day (BID) | ORAL | 0 refills | Status: DC

## 2019-02-03 NOTE — Discharge Summary (Addendum)
[facility] [place] [facility]   PATIENT NAME: Dylan [name]    MR#:  [id number]  DATE OF BIRTH:  12/01/1935  DATE OF ADMISSION:  01/30/2019   ADMITTING PHYSICIAN: Jama Flavors, MD  DATE OF DISCHARGE:  02/03/19  PRIMARY CARE PHYSICIAN: [name], MD   ADMISSION DIAGNOSIS:   Weakness [R53.1] Cystitis [N30.90] Acute metabolic encephalopathy [G93.41]  DISCHARGE DIAGNOSIS:   Active Problems:   Acute on chronic systolic CHF (congestive heart failure) (HCC)   SECONDARY DIAGNOSIS:   Past Medical History:  Diagnosis Date  . CAD (coronary artery disease)   . Cervicalgia   . CHF (congestive heart failure) (HCC)   . COPD (chronic obstructive pulmonary disease) (HCC)   . Diastolic heart failure (HCC)   . Foot drop, right   . History of kidney stones   . Hyperlipidemia    unspecified  . Hypertension   . Myocardial infarction (HCC)   . Osteoarthritis   . Shoulder pain, left   . Sleep apnea   . Tremor, essential     HOSPITAL COURSE:   83 year old male with past medical history significant for CAD, COPD on chronic oxygen, hypertension presenting to hospital secondary to worsening shortness of breath.  1.  Acute lacunar stroke with hemorrhagic conversion-noted on CT head done for altered mental status -MRI confirmed the 9 mm acute lacunar infarct in the left external capsule.  No focal deficits. -repeat CT of the head with improvement -Aspirin will be started from today.  All antiplatelets have been held for 72 hours..  Appreciate neurology consult.  Continue to hold Plavix.  Mental status is improved and close to baseline. -PT OT and speech therapy consults appreciated.  Patient will go home with home health.  At baseline he is wheelchair-bound.  2.  Acute on chronic systolic CHF exacerbation-echocardiogram from March 2020 with known EF of 40 to 45%. -Admitted for IV Lasix-change to oral Lasix, continue daily weights and strict input and output  monitoring. -Uses chronic home oxygen, on 2-3 L. -Hold entresto due to hypotension and acute stroke -Restarted low-dose Coreg and Aldactone  3.  UTI-cultures have been sent for.  Cultures growing [place]  sensitive to Rocephin.  Received Rocephin and meropenem in the hospital for a total of  3 days. - Prior history of resistant Klebsiella in December 2019.     4.  Chronic respiratory failure secondary to severe COPD- on home oxygen, from COPD - also has Obstructive sleep apnea- will benefit from non-invasive ventilation at bedtime  5.  DVT prophylaxis-teds and SCDs only  6.  Chronic right toe osteomyelitis-appreciate podiatry consult.  Was on Augmentin as outpatient, now that he is on Rocephin in the hospital for UTI, add Flagyl for anaerobic coverage.  Resume Augmentin at discharge.  -Due to acute stroke, not a surgical candidate at this time.  Outpatient follow-up.  Patient has open wound and has history of easy bleeding from the wound.  Daily dressing changes recommended . Since his fall and left knee surgery in December 2019, has not been ambulating much.  He has a Nurse, adult at home Discharge home with home health today.  Discussed with wife today.   DISCHARGE CONDITIONS:   Guarded  CONSULTS OBTAINED:   Treatment Team:  Pauletta Browns, MD Recardo Evangelist, DPM  DRUG ALLERGIES:   No Known Allergies DISCHARGE MEDICATIONS:   Allergies as of 02/03/2019   No Known Allergies     Medication List    STOP  taking these medications   clopidogrel 75 MG tablet Commonly known as:  PLAVIX   doxycycline 100 MG tablet Commonly known as:  VIBRA-TABS   sacubitril-valsartan 49-51 MG Commonly known as:  ENTRESTO   torsemide 10 MG tablet Commonly known as:  DEMADEX     TAKE these medications   acetaminophen 500 MG tablet Commonly known as:  TYLENOL Take 1,000 mg by mouth 2 (two) times daily as needed for moderate pain.   alum & mag hydroxide-simeth 400-400-40  MG/5ML suspension Commonly known as:  MAALOX PLUS Take 30 mLs by mouth every 4 (four) hours as needed for indigestion.   amoxicillin-clavulanate 875-125 MG tablet Commonly known as:  AUGMENTIN Take 1 tablet by mouth every 12 (twelve) hours for 20 days.   aspirin EC 81 MG tablet Take 81 mg by mouth daily.   atorvastatin 80 MG tablet Commonly known as:  LIPITOR Take 80 mg by mouth daily.   carvedilol 6.25 MG tablet Commonly known as:  COREG Take 6.25 mg by mouth 2 (two) times daily with a meal.   CENTROVITE Tabs Take 1 tablet by mouth daily.   Cholecalciferol 25 MCG (1000 UT) tablet Commonly known as:  Vitamin D-1000 Max St Take 1 tablet (1,000 Units total) by mouth daily.   folic acid 400 MCG tablet Commonly known as:  V-R FOLIC ACID Take 1 tablet (400 mcg total) by mouth daily.   furosemide 40 MG tablet Commonly known as:  LASIX Take 40 mg by mouth 2 (two) times daily.   gabapentin 300 MG capsule Commonly known as:  Neurontin Take 1 capsule (300 mg total) by mouth at bedtime.   ipratropium-albuterol 0.5-2.5 (3) MG/3ML Soln Commonly known as:  DUONEB Take 3 mLs by nebulization every 6 (six) hours as needed (shortness of breath).   isosorbide mononitrate 30 MG 24 hr tablet Commonly known as:  IMDUR Take 30 mg by mouth daily.   ketotifen 0.025 % ophthalmic solution Commonly known as:  ZADITOR Place 1 drop into both eyes as needed.   loratadine 10 MG tablet Commonly known as:  CLARITIN Take 1 tablet (10 mg total) by mouth as needed for allergies.   magnesium hydroxide 400 MG/5ML suspension Commonly known as:  MILK OF MAGNESIA Take 30 mLs by mouth daily as needed for mild constipation.   Nicotine 10 MG/ML Soln Place 1 spray into the nose as needed for smoking cessation.   nitroGLYCERIN 0.3 MG SL tablet Commonly known as:  NITROSTAT Place 0.3 mg under the tongue every 5 (five) minutes as needed for chest pain.   pantoprazole 40 MG tablet Commonly known as:   PROTONIX Take 1 tablet (40 mg total) by mouth daily.   polyethylene glycol 17 g packet Commonly known as:  MIRALAX / GLYCOLAX Take 17 g by mouth 2 (two) times daily as needed. Hold for loose stools.   potassium chloride SA 20 MEQ tablet Commonly known as:  K-DUR Take 1 tablet (20 mEq total) by mouth daily.   predniSONE 20 MG tablet Commonly known as:  DELTASONE Take 1 tablet (20 mg total) by mouth daily. 1 po daily   primidone 50 MG tablet Commonly known as:  MYSOLINE Take 200 mg by mouth 2 (two) times daily.   QUEtiapine 50 MG tablet Commonly known as:  SEROQUEL Take 1 tablet (50 mg total) by mouth at bedtime as needed.   sennosides-docusate sodium 8.6-50 MG tablet Commonly known as:  SENOKOT-S Take 2 tablets by mouth daily as needed for constipation.  spironolactone 25 MG tablet Commonly known as:  ALDACTONE Take 12.5 mg by mouth daily.   tamsulosin 0.4 MG Caps capsule Commonly known as:  FLOMAX Take 1 capsule (0.4 mg total) by mouth daily.   traZODone 50 MG tablet Commonly known as:  DESYREL Take 50 mg by mouth at bedtime.   umeclidinium-vilanterol 62.5-25 MCG/INH Aepb Commonly known as:  ANORO ELLIPTA Inhale 1 puff into the lungs as needed.   VITAMIN C PO Take 500 mg by mouth daily.            Durable Medical Equipment  (From admission, onward)         Start     Ordered   02/02/19 1010  For home use only DME lightweight manual wheelchair with seat cushion  Once    Comments:  Patient suffers from stroke and arthritis which impairs their ability to perform daily activities like walking in the home.  A walker will not resolve  issue with performing activities of daily living. A wheelchair will allow patient to safely perform daily activities. Patient is not able to propel themselves in the home using a standard weight wheelchair due to weight. Patient can self propel in the lightweight wheelchair. Length of need: 1 year. Accessories: elevating leg rests  (ELRs), wheel locks, extensions and anti-tippers. Back cushion   02/02/19 1010           DISCHARGE INSTRUCTIONS:   1.  PCP follow-up in 1 to 2 weeks  2.  Neurology follow-up in 2 weeks. 3.  Cardiology follow-up in 2 to 3 weeks. 4. Palliative care follow up  DIET:   Cardiac diet  ACTIVITY:   Activity as tolerated  OXYGEN:   Home Oxygen: Yes.    Oxygen Delivery: 3 liters/min via Patient connected to nasal cannula oxygen  DISCHARGE LOCATION:   home   If you experience worsening of your admission symptoms, develop shortness of breath, life threatening emergency, suicidal or homicidal thoughts you must seek medical attention immediately by calling 911 or calling your MD immediately  if symptoms less severe.  You Must read complete instructions/literature along with all the possible adverse reactions/side effects for all the Medicines you take and that have been prescribed to you. Take any new Medicines after you have completely understood and accpet all the possible adverse reactions/side effects.   Please note  You were cared for by a hospitalist during your hospital stay. If you have any questions about your discharge medications or the care you received while you were in the hospital after you are discharged, you can call the unit and asked to speak with the hospitalist on call if the hospitalist that took care of you is not available. Once you are discharged, your primary care physician will handle any further medical issues. Please note that NO REFILLS for any discharge medications will be authorized once you are discharged, as it is imperative that you return to your primary care physician (or establish a relationship with a primary care physician if you do not have one) for your aftercare needs so that they can reassess your need for medications and monitor your lab values.    On the day of Discharge:  VITAL SIGNS:   Blood pressure 129/84, pulse 74, temperature 97.7 F  (36.5 C), temperature source Oral, resp. rate 15, height 5\' 10"  (1.778 m), weight 120.7 kg, SpO2 99 %.  PHYSICAL EXAMINATION:    GENERAL:  83 y.o.-year-old obese patient lying in the bed with no acute  distress.  EYES: Pupils equal, round, reactive to light and accommodation. No scleral icterus. Extraocular muscles intact.  HEENT: Head atraumatic, normocephalic. Oropharynx and nasopharynx clear.  NECK:  Supple, no jugular venous distention. No thyroid enlargement, no tenderness.  LUNGS: Normal breath sounds bilaterally, no wheezing, rales,rhonchi or crepitation. No use of accessory muscles of respiration. Decreased bibasilar breath sounds CARDIOVASCULAR: S1, S2 normal. No murmurs, rubs, or gallops.  ABDOMEN: Soft, obese, nontender, nondistended. Bowel sounds present. No organomegaly or mass.  EXTREMITIES: No pedal edema, cyanosis, or clubbing.  However chronic right toe dorsal chronic osteomyelitis wound with active bleeding Tremors of both hands noted which is chronic NEUROLOGIC: Cranial nerves II through XII are intact.  Alert,  Muscle strength equal in all extremities. Sensation intact. Gait not checked.  PSYCHIATRIC: The patient is alert and oriented x3 SKIN: No obvious rash, lesion, or ulcer.        DATA REVIEW:   CBC Recent Labs  Lab 02/03/19 0230  WBC 7.8  HGB 10.3*  HCT 33.8*  PLT 169    Chemistries  Recent Labs  Lab 01/30/19 1404 01/30/19 2352  02/03/19 0230  NA 144 144   < > 139  K 4.6 4.1   < > 3.7  CL 99 99   < > 98  CO2 36* 36*   < > 32  GLUCOSE 168* 116*   < > 94  BUN 39* 40*   < > 39*  CREATININE 1.15 1.05   < > 0.93  CALCIUM 9.0 8.6*   < > 7.9*  MG  --  2.2  --   --   AST 49*  --   --   --   ALT 50*  --   --   --   ALKPHOS 151*  --   --   --   BILITOT 1.0  --   --   --    < > = values in this interval not displayed.     Microbiology Results  Results for orders placed or performed during the hospital encounter of 01/30/19  Urine Culture      Status: Abnormal   Collection Time: 01/30/19  3:49 PM  Result Value Ref Range Status   Specimen Description   Final    URINE, RANDOM Performed at [facility], [address] [place], [place] [address]    Special Requests   Final    NONE Performed at [facility], [address] [place], [place] [address]    Culture >=100,000 COLONIES/mL PROVIDENCIA STUARTII (A)  Final   Report Status 02/02/2019 FINAL  Final   Organism ID, Bacteria PROVIDENCIA STUARTII (A)  Final      Susceptibility   Providencia stuartii - MIC*    AMPICILLIN >=32 RESISTANT Resistant     CEFAZOLIN >=64 RESISTANT Resistant     CEFTRIAXONE <=1 SENSITIVE Sensitive     CIPROFLOXACIN >=4 RESISTANT Resistant     GENTAMICIN RESISTANT Resistant     IMIPENEM 2 SENSITIVE Sensitive     NITROFURANTOIN 256 RESISTANT Resistant     TRIMETH/SULFA 160 RESISTANT Resistant     AMPICILLIN/SULBACTAM >=32 RESISTANT Resistant     PIP/TAZO <=4 SENSITIVE Sensitive     * >=100,000 COLONIES/mL PROVIDENCIA STUARTII  SARS Coronavirus 2 (CEPHEID - Performed in [facility] hospital lab), Hosp Order     Status: None   Collection Time: 01/30/19  4:03 PM  Result Value Ref Range Status   SARS Coronavirus 2 NEGATIVE NEGATIVE Final  Comment: (NOTE) If result is NEGATIVE SARS-CoV-2 target nucleic acids are NOT DETECTED. The SARS-CoV-2 RNA is generally detectable in upper and lower  respiratory specimens during the acute phase of infection. The lowest  concentration of SARS-CoV-2 viral copies this assay can detect is 250  copies / mL. A negative result does not preclude SARS-CoV-2 infection  and should not be used as the sole basis for treatment or other  patient management decisions.  A negative result may occur with  improper specimen collection / handling, submission of specimen other  than nasopharyngeal swab, presence of viral mutation(s) within the  areas targeted by this assay, and inadequate number of viral copies   (<250 copies / mL). A negative result must be combined with clinical  observations, patient history, and epidemiological information. If result is POSITIVE SARS-CoV-2 target nucleic acids are DETECTED. The SARS-CoV-2 RNA is generally detectable in upper and lower  respiratory specimens dur ing the acute phase of infection.  Positive  results are indicative of active infection with SARS-CoV-2.  Clinical  correlation with patient history and other diagnostic information is  necessary to determine patient infection status.  Positive results do  not rule out bacterial infection or co-infection with other viruses. If result is PRESUMPTIVE POSTIVE SARS-CoV-2 nucleic acids MAY BE PRESENT.   A presumptive positive result was obtained on the submitted specimen  and confirmed on repeat testing.  While 2019 novel coronavirus  (SARS-CoV-2) nucleic acids may be present in the submitted sample  additional confirmatory testing may be necessary for epidemiological  and / or clinical management purposes  to differentiate between  SARS-CoV-2 and other Sarbecovirus currently known to infect humans.  If clinically indicated additional testing with an alternate test  methodology [phone]) is advised. The SARS-CoV-2 RNA is generally  detectable in upper and lower respiratory sp ecimens during the acute  phase of infection. The expected result is Negative. Fact Sheet for Patients:  BoilerBrush.com.cy Fact Sheet for Healthcare Providers: https://pope.com/ This test is not yet approved or cleared by the [place] FDA and has been authorized for detection and/or diagnosis of SARS-CoV-2 by FDA under an Emergency Use Authorization (EUA).  This EUA will remain in effect (meaning this test can be used) for the duration of the COVID-19 declaration under Section 564(b)(1) of the Act, 21 U.S.C. section 360bbb-3(b)(1), unless the authorization is terminated or  revoked sooner. Performed at [facility], [address] [place], [place] [address]   MRSA PCR Screening     Status: Abnormal   Collection Time: 02/01/19 11:24 AM  Result Value Ref Range Status   MRSA by PCR POSITIVE (A) NEGATIVE Final    Comment:        The GeneXpert MRSA Assay (FDA approved for NASAL specimens only), is one component of a comprehensive MRSA colonization surveillance program. It is not intended to diagnose MRSA infection nor to guide or monitor treatment for MRSA infections. RESULT CALLED TO, READ BACK BY AND VERIFIED WITH: BERNICE ALEJO AT 1816 ON 02/01/2019 JJB Performed at [facility], [address] [place], [place] [address]     RADIOLOGY:  No results found.   Management plans discussed with the patient, family and they are in agreement.  CODE STATUS:     Code Status Orders  (From admission, onward)         Start     Ordered   01/30/19 1805  Full code  Continuous     01/30/19 1805  Code Status History    Date Active Date Inactive Code Status Order ID Comments User Context   11/23/2018 0619 12/01/2018 1541 Full Code [id number]  Arnaldo Natal, MD Inpatient   11/14/2018 0314 11/19/2018 1822 Full Code [id number]  Hannah Beat, MD ED   10/27/2018 2101 10/31/2018 2110 Partial Code [id number]  Altamese Dilling, MD Inpatient   08/26/2018 1137 09/04/2018 1912 Full Code [id number]  Kennedy Bucker, MD ED   06/21/2018 1321 06/24/2018 2008 Partial Code [id number]  Milagros Loll, MD Inpatient   06/21/2018 0803 06/21/2018 1321 Full Code [id number]  Iran Ouch, MD Inpatient   06/01/2018 2009 06/04/2018 1718 Full Code [id number]  Donato Heinz, MD Inpatient   01/11/2018 0012 01/14/2018 1908 Full Code [id number]  Cammy Copa, MD Inpatient   12/01/2017 1924 12/05/2017 1948 Full Code [id number]  Altamese Dilling, MD Inpatient   08/31/2017 0557 09/05/2017 1959 Full Code [id number]  Arnaldo Natal, MD Inpatient   06/10/2016 1934  06/13/2016 1816 Full Code [id number]  Shaune Pollack, MD Inpatient   12/15/2015 0134 12/16/2015 1507 Full Code [id number]  Ihor Austin, MD ED    Advance Directive Documentation     Most Recent Value  Type of Advance Directive  Healthcare Power of Attorney, Living will  Pre-existing out of facility DNR order (yellow form or pink MOST form)  -  "MOST" Form in Place?  -      TOTAL TIME TAKING CARE OF THIS PATIENT: 38 minutes.    Enid Baas M.D on 02/03/2019 at 10:31 AM  Between 7am to 6pm - Pager - [phone]  After 6pm go to [email or web address] - Social research officer, government  [facility]  Office  [phone]  CC: Primary care physician; [name], MD   Note: This dictation was prepared with Dragon dictation along with smaller phrase technology. Any transcriptional errors that result from this process are unintentional.

## 2019-02-03 NOTE — Clinical Social Work Note (Signed)
Patient suffers from an acute stroke which impairs his ability to perform daily activities like toileting, feeding dressing, grooming and bathing in the home. A cane, walker, or crutch will not resolve issue with performing activities of daily living. A wheelchair will allow patient to safely perform daily activities. Patient can safely propel the wheelchair in the home or has a caregiver who can provide assistance.

## 2019-02-03 NOTE — Plan of Care (Signed)
  Problem: Safety: Goal: Ability to remain free from injury will improve Outcome: Progressing   Problem: Skin Integrity: Goal: Risk for impaired skin integrity will decrease Outcome: Progressing   Problem: Intracerebral Hemorrhage Tissue Perfusion: Goal: Complications of Intracerebral Hemorrhage will be minimized Outcome: Progressing

## 2019-02-03 NOTE — Progress Notes (Signed)
Speech Therapy Note: reviewed pt's chart, labs; consulted MD/NSG and met w/ pt. Pt was feeding himself a late breakfast meal(2 meals actually had arrived). He c/o no difficulty swallowing, eating/drinking the meals. Pt verbally conversive w/ NSG and SLP in basic conversation. He had required tray setup for the meals.  No reports of overt s/s of aspiration noted per chart/NSG notes. As pt appears at his baseline w/ swallowing and is tolerating oral intake/meds w/out difficulty, no further skilled ST Services indicated. NSG to reconsult if any decline in status while admitted.  Recommend swallowing pills w/ a Puree IF needed for easier swallowing; general aspiration precautions including sitting upright for any eating/drinking.    [name], [place], CCC-SLP

## 2019-02-03 NOTE — Progress Notes (Signed)
Subjective: No new neurological complaints  Objective: Current vital signs: BP 129/84 (BP Location: Right Arm)   Pulse 74   Temp 97.7 F (36.5 C) (Oral)   Resp 15   Ht 5\' 10"  (1.778 m)   Wt 120.7 kg   SpO2 99%   BMI 38.18 kg/m  Vital signs in last 24 hours: Temp:  [97.5 F (36.4 C)-98.6 F (37 C)] 97.7 F (36.5 C) (06/03 0746) Pulse Rate:  [68-74] 74 (06/03 0746) Resp:  [15-19] 15 (06/03 0746) BP: (128-138)/(71-84) 129/84 (06/03 0746) SpO2:  [99 %-100 %] 99 % (06/03 0746) Weight:  [120.7 kg] 120.7 kg (06/03 0500)  Intake/Output from previous day: 06/02 0701 - 06/03 0700 In: 106 [I.V.:6; IV Piggyback:100] Out: 1050 [Urine:1050] Intake/Output this shift: Total I/O In: -  Out: 300 [Urine:300] Nutritional status:  Diet Order            Diet - low sodium heart healthy        Diet Heart Room service appropriate? Yes with Assist; Fluid consistency: Thin  Diet effective now              Neurologic Exam: Mental Status: Alert, oriented, thought content appropriate.  Speech fluent without evidence of aphasia.  Able to follow 3 step commands without difficulty. Cranial Nerves: II: Discs flat bilaterally; Visual fields grossly normal, pupils equal, round, reactive to light and accommodation III,IV, VI: ptosis not present, extra-ocular motions intact bilaterally V,VII: smile symmetric, facial light touch sensation normal bilaterally VIII: hearing normal bilaterally IX,X: gag reflex present XI: bilateral shoulder shrug XII: midline tongue extension Motor: Generalized weakness.  Unable to lift arms above 90 degrees due to shoulder limitation. Left leg weaker than right.  Tremor. Sensory: Pinprick and light touch intact throughout, bilaterally Deep Tendon Reflexes: 1+ and symmetric throughout  Lab Results: Basic Metabolic Panel: Recent Labs  Lab 01/30/19 1404 01/30/19 2352 02/01/19 0545 02/02/19 0336 02/03/19 0230  NA 144 144 142 142 139  K 4.6 4.1 4.0 4.1 3.7   CL 99 99 99 97* 98  CO2 36* 36* 36* 37* 32  GLUCOSE 168* 116* 99 96 94  BUN 39* 40* 37* 41* 39*  CREATININE 1.15 1.05 0.96 1.09 0.93  CALCIUM 9.0 8.6* 8.4* 8.3* 7.9*  MG  --  2.2  --   --   --     Liver Function Tests: Recent Labs  Lab 01/30/19 1404  AST 49*  ALT 50*  ALKPHOS 151*  BILITOT 1.0  PROT 7.2  ALBUMIN 3.1*   No results for input(s): LIPASE, AMYLASE in the last 168 hours. No results for input(s): AMMONIA in the last 168 hours.  CBC: Recent Labs  Lab 01/30/19 1404 01/30/19 2352 02/03/19 0230  WBC 10.7* 11.4* 7.8  NEUTROABS 7.3  --   --   HGB 12.2* 11.3* 10.3*  HCT 40.9 36.9* 33.8*  MCV 101.0* 99.7 99.7  PLT 217 191 169    Cardiac Enzymes: Recent Labs  Lab 01/30/19 1404 01/30/19 2352 01/31/19 0611  TROPONINI 0.07* 0.07* 0.08*    Lipid Panel: Recent Labs  Lab 02/02/19 0336  CHOL 92  TRIG 52  HDL 34*  CHOLHDL 2.7  VLDL 10  LDLCALC 48    CBG: No results for input(s): GLUCAP in the last 168 hours.  Microbiology: Results for orders placed or performed during the hospital encounter of 01/30/19  Urine Culture     Status: Abnormal   Collection Time: 01/30/19  3:49 PM  Result Value Ref Range  Status   Specimen Description   Final    URINE, RANDOM Performed at [facility], [address] [place], [place] [address]    Special Requests   Final    NONE Performed at [facility], [address] [place], [place] [address]    Culture >=100,000 COLONIES/mL PROVIDENCIA STUARTII (A)  Final   Report Status 02/02/2019 FINAL  Final   Organism ID, Bacteria PROVIDENCIA STUARTII (A)  Final      Susceptibility   Providencia stuartii - MIC*    AMPICILLIN >=32 RESISTANT Resistant     CEFAZOLIN >=64 RESISTANT Resistant     CEFTRIAXONE <=1 SENSITIVE Sensitive     CIPROFLOXACIN >=4 RESISTANT Resistant     GENTAMICIN RESISTANT Resistant     IMIPENEM 2 SENSITIVE Sensitive     NITROFURANTOIN 256 RESISTANT Resistant     TRIMETH/SULFA  160 RESISTANT Resistant     AMPICILLIN/SULBACTAM >=32 RESISTANT Resistant     PIP/TAZO <=4 SENSITIVE Sensitive     * >=100,000 COLONIES/mL PROVIDENCIA STUARTII  SARS Coronavirus 2 (CEPHEID - Performed in [facility] hospital lab), Hosp Order     Status: None   Collection Time: 01/30/19  4:03 PM  Result Value Ref Range Status   SARS Coronavirus 2 NEGATIVE NEGATIVE Final    Comment: (NOTE) If result is NEGATIVE SARS-CoV-2 target nucleic acids are NOT DETECTED. The SARS-CoV-2 RNA is generally detectable in upper and lower  respiratory specimens during the acute phase of infection. The lowest  concentration of SARS-CoV-2 viral copies this assay can detect is 250  copies / mL. A negative result does not preclude SARS-CoV-2 infection  and should not be used as the sole basis for treatment or other  patient management decisions.  A negative result may occur with  improper specimen collection / handling, submission of specimen other  than nasopharyngeal swab, presence of viral mutation(s) within the  areas targeted by this assay, and inadequate number of viral copies  (<250 copies / mL). A negative result must be combined with clinical  observations, patient history, and epidemiological information. If result is POSITIVE SARS-CoV-2 target nucleic acids are DETECTED. The SARS-CoV-2 RNA is generally detectable in upper and lower  respiratory specimens dur ing the acute phase of infection.  Positive  results are indicative of active infection with SARS-CoV-2.  Clinical  correlation with patient history and other diagnostic information is  necessary to determine patient infection status.  Positive results do  not rule out bacterial infection or co-infection with other viruses. If result is PRESUMPTIVE POSTIVE SARS-CoV-2 nucleic acids MAY BE PRESENT.   A presumptive positive result was obtained on the submitted specimen  and confirmed on repeat testing.  While 2019 novel coronavirus   (SARS-CoV-2) nucleic acids may be present in the submitted sample  additional confirmatory testing may be necessary for epidemiological  and / or clinical management purposes  to differentiate between  SARS-CoV-2 and other Sarbecovirus currently known to infect humans.  If clinically indicated additional testing with an alternate test  methodology [phone]) is advised. The SARS-CoV-2 RNA is generally  detectable in upper and lower respiratory sp ecimens during the acute  phase of infection. The expected result is Negative. Fact Sheet for Patients:  BoilerBrush.com.cy Fact Sheet for Healthcare Providers: https://pope.com/ This test is not yet approved or cleared by the [place] FDA and has been authorized for detection and/or diagnosis of SARS-CoV-2 by FDA under an Emergency Use Authorization (EUA).  This EUA will remain in effect (  meaning this test can be used) for the duration of the COVID-19 declaration under Section 564(b)(1) of the Act, 21 U.S.C. section 360bbb-3(b)(1), unless the authorization is terminated or revoked sooner. Performed at [facility], [address] [place], [place] [address]   MRSA PCR Screening     Status: Abnormal   Collection Time: 02/01/19 11:24 AM  Result Value Ref Range Status   MRSA by PCR POSITIVE (A) NEGATIVE Final    Comment:        The GeneXpert MRSA Assay (FDA approved for NASAL specimens only), is one component of a comprehensive MRSA colonization surveillance program. It is not intended to diagnose MRSA infection nor to guide or monitor treatment for MRSA infections. RESULT CALLED TO, READ BACK BY AND VERIFIED WITH: BERNICE ALEJO AT 1816 ON 02/01/2019 JJB Performed at [facility], [address] [place], [place] [address]     Coagulation Studies: No results for input(s): LABPROT, INR in the last 72 hours.  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 Improving mental status. EXAM: CT HEAD WITHOUT CONTRAST TECHNIQUE: Contiguous axial images were obtained from the base of the skull through the vertex without intravenous contrast. COMPARISON:  CT head 01/31/2019 and 11/30/2018.  MRI 01/31/2019. FINDINGS: Brain: Small focus of acute hemorrhage in the left external capsule has mildly improved compared with the CT from yesterday (image 18/2). There are no new areas of acute intracranial hemorrhage. There is no extra-axial fluid collection, midline shift or hydrocephalus. There is generalized atrophy with prominence of the ventricles and subarachnoid spaces. Extensive small vessel ischemic changes are present within the periventricular white matter and basal ganglia bilaterally. There is no CT evidence of acute cortical infarction. Vascular: Prominent intracranial vascular calcifications. No hyperdense vessel identified. Skull: Negative for fracture or focal lesion. Sinuses/Orbits: The visualized paranasal sinuses and mastoid air cells are clear. No orbital abnormalities are seen. Other: None. IMPRESSION: 1. Slight improvement in the focal hemorrhage in the left external capsule compared with yesterday's CT. No progressive mass effect or new hemorrhage identified. 2. Otherwise stable extensive chronic small vessel ischemic changes. Electronically Signed   By: Carey Bullocks M.D.   On: 02/01/2019 12:55    Medications:  I have reviewed the patient's current medications. Scheduled: . aspirin  81 mg Oral Daily  . atorvastatin  80 mg Oral Daily  . cholecalciferol  1,000 Units Oral Daily  . folic acid  500 mcg Oral Daily  . furosemide  40 mg Intravenous Q12H  . gabapentin  300 mg Oral QHS  . mouth rinse  15 mL Mouth Rinse BID  . metroNIDAZOLE  500 mg Oral Q8H  . multivitamin with minerals  1 tablet Oral Daily  . mupirocin ointment   Nasal BID  . pantoprazole  40 mg  Oral Q24H  . potassium chloride SA  20 mEq Oral Daily  . sodium chloride flush  3 mL Intravenous Q12H  . spironolactone  12.5 mg Oral Daily  . tamsulosin  0.4 mg Oral QPM  . traZODone  50 mg Oral QHS  . vitamin C  500 mg Oral Daily    Assessment/Plan: 83 year old male presenting with weakness noted to have 9mm acute external capsular hemorrhage on imaging.  Patient taken off Plavix and ASA.  Remains on statin.  Repeat imaging today shows a slight improvement.  Carotid dopplers show no evidence of hemodynamically significant stenosis.  Echocardiogram shows no cardiac source of emboli with an EF of 55-60%.  A1c 5.4, LDL 48.  Recommendations: 1. ASA at 81mg  to restart today.   2. Continue therapy   LOS: 4 days   Thana Farr, MD Neurology [phone] 02/03/2019  10:34 AM

## 2019-02-03 NOTE — TOC Progression Note (Signed)
Transition of Care [facility]) - Progression Note    Patient Details  Name: Dylan [name] MRN: [id number] Date of Birth: 28-Feb-1936  [facility] CM/SW Contact  [place], [place] Phone Number: 02/03/2019, 2:19 PM  Clinical Narrative:   Patient discharging today and needs a standard wheelchair that is heavy duty. CSW coordinated with the physician to get the necessary documentation for the wheelchair. Brad with [employer] will deliver the wheelchair to patient's room.     Expected Discharge Plan: Home w Home Health Services Barriers to Discharge: Continued Medical Work up  Expected Discharge Plan and Services Expected Discharge Plan: Home w Home Health Services   Discharge Planning Services: CM Consult Post Acute Care Choice: Resumption of Svcs/PTA Provider Living arrangements for the past 2 months: Single Family Home Expected Discharge Date: 02/03/19               DME Arranged: Community education officer wheelchair with seat cushion DME Agency: [facility] (now [facility]) Date DME Agency Contacted: 02/02/19 Time DME Agency Contacted: [phone] Representative spoke with at DME Agency: Brad with [employer] HH Arranged: RN, PT, OT HH Agency: [facility] (formerly [facility]) Date HH Agency Contacted: 01/31/19 Time HH Agency Contacted: 1001 Representative spoke with at [facility] Agency: [name]   Social Determinants of Health (SDOH) Interventions    Readmission Risk Interventions Readmission Risk Prevention Plan 02/02/2019 01/31/2019 11/17/2018  Transportation Screening Complete - -  Medication Review Oceanographer) Complete Complete -  PCP or Specialist appointment within 3-5 days of discharge Complete - Complete  PCP/Specialist Appt Not Complete comments - - -  HRI or Home Care Consult Complete - -  SW Recovery Care/Counseling Consult Complete - -  Palliative Care Screening Complete - -  Skilled Nursing Facility Not Applicable - -  SNF Comments - - -  Some recent data  might be hidden

## 2019-02-03 NOTE — Progress Notes (Signed)
Patient is alert and oriented, vss, no complaints of pain.  EMS on the way.  Discharging to home.

## 2019-02-08 ENCOUNTER — Other Ambulatory Visit (HOSPITAL_COMMUNITY): Payer: Self-pay

## 2019-02-08 NOTE — Progress Notes (Signed)
Today was a home visit with [name].  He appears to be more alert today than our last visit prior to his hospital stay.  He states doing ok, he states has a lazy bone today.  His aid states he did not want to get out of bed today, he is still in the bed.  He denies feeling bad, denies pain.  He states just can not sleep good at night with bipap.  He suppose to we are bipap at night and oxygen during the day.  He states sleeping in the day more.  Advised him to try wearing bipap when he is sleeping the most.  No swelling in legs, little swelling in ankle areas.  He does have high socks on.  His appetite has been good today.  He has been up every day but today.  He states will get up when I leave.  He has [facility], applications and PT coming out.  His wife takes care of his medications for him.  He was constipated and they gave laxative and now he has been going a lot and several times.  He states easing off.  He drinks mostly water during the day, has 1 glass of orange juice, 2 cups of coffee and 1 glass of tea a day.  Lungs sounds clear, he states breathing is good.  He has 24 hour aid that watches and cares for him.  He has not got his new wheel chair yet and he wants to go outside on the porch.  He has an office appt with Darylene Price discussed wife aid, wife and Estill Bamberg at [facility] clinic about a virtual visit due to he is completely dependant on someone to move him right now.  He states he has loss all his muscle strength.  Estill Bamberg states she will change the appt to virtual.  He is able to answer all my questions appropriately. Spoke to his wife about any concerns she may have, she states she sees a decline in him since discharge but better than he was when he was admitted.   Will continue to visit for heart failure.   [place] [phone]

## 2019-02-09 ENCOUNTER — Telehealth: Payer: Self-pay | Admitting: Primary Care

## 2019-02-09 ENCOUNTER — Telehealth: Payer: Self-pay

## 2019-02-09 ENCOUNTER — Other Ambulatory Visit: Payer: Medicare Other | Admitting: Primary Care

## 2019-02-09 ENCOUNTER — Other Ambulatory Visit: Payer: Self-pay

## 2019-02-09 DIAGNOSIS — Z515 Encounter for palliative care: Secondary | ICD-10-CM

## 2019-02-09 NOTE — Telephone Encounter (Signed)
   TELEPHONE CALL NOTE  This patient has been deemed a candidate for follow-up tele-health visit to limit community exposure during the Covid-19 pandemic. I spoke with the patient via phone to discuss instructions. The patient was advised to review the section on consent for treatment as well. The patient will receive a phone call 2-3 days prior to their E-Visit at which time consent will be verbally confirmed. A Virtual Office Visit appointment type has been scheduled for 02/10/2019 with Darylene Price FNP.  Gaylord Shih, CMA 02/09/2019 1:58 PM

## 2019-02-09 NOTE — Telephone Encounter (Signed)
T/c 6/8 to set up appt for today, 6/9. Spoke with pt care giver. She states to make appt with next caregiver who begins on Tues at noon. Appt made for 2 pm.

## 2019-02-09 NOTE — Telephone Encounter (Signed)
TELEPHONE CALL NOTE  Dylan [name] has been deemed a candidate for a follow-up tele-health visit to limit community exposure during the Covid-19 pandemic. I spoke with the patient via phone to ensure availability of phone/video source, confirm preferred email & phone number, discuss instructions and expectations, and review consent.   I reminded Dylan [name] to be prepared with any vital sign and/or heart rhythm information that could potentially be obtained via home monitoring, at the time of his visit.  Finally, I reminded Dylan [name] to expect an e-mail containing a link for their video-based visit approximately 15 minutes before his visit, or alternatively, a phone call at the time of his visit if his visit is planned to be a phone encounter.  Did the patient verbally consent to treatment as below? YES  Gaylord Shih, CMA 02/09/2019 1:59 PM  CONSENT FOR TELE-HEALTH VISIT - PLEASE REVIEW  I hereby voluntarily request, consent and authorize [facility] and its employed or contracted physicians, physician assistants, nurse practitioners or other licensed health care professionals (the Practitioner), to provide me with telemedicine health care services (the "Services") as deemed necessary by the treating Practitioner. I acknowledge and consent to receive the Services by the Practitioner via telemedicine. I understand that the telemedicine visit will involve communicating with the Practitioner through telephonic communication technology and the disclosure of certain medical information by electronic transmission. I acknowledge that I have been given the opportunity to request an in-person assessment or other available alternative prior to the telemedicine visit and am voluntarily participating in the telemedicine visit.  I understand that I have the right to withhold or withdraw my consent to the use of telemedicine in the course of my care at any time, without affecting my right to  future care or treatment, and that the Practitioner or I may terminate the telemedicine visit at any time. I understand that I have the right to inspect all information obtained and/or recorded in the course of the telemedicine visit and may receive copies of available information for a reasonable fee.  I understand that some of the potential risks of receiving the Services via telemedicine include:  Marland Kitchen Delay or interruption in medical evaluation due to technological equipment failure or disruption; . Information transmitted may not be sufficient (e.g. poor resolution of images) to allow for appropriate medical decision making by the Practitioner; and/or  . In rare instances, security protocols could fail, causing a breach of personal health information.  Furthermore, I acknowledge that it is my responsibility to provide information about my medical history, conditions and care that is complete and accurate to the best of my ability. I acknowledge that Practitioner's advice, recommendations, and/or decision may be based on factors not within their control, such as incomplete or inaccurate data provided by me or lack of visual representation. I understand that the practice of medicine is not an exact science and that Practitioner makes no warranties or guarantees regarding treatment outcomes. I acknowledge that I will receive a copy of this consent concurrently upon execution via email to the email address I last provided but may also request a printed copy by calling the office of [facility].    I understand that my insurance may be billed for this visit.   I have read or had this consent read to me. . I understand the contents of this consent, which adequately explains the benefits and risks of the Services being provided via telemedicine.  Marland Kitchen  I have been provided ample opportunity to ask questions regarding this consent and the Services and have had my questions answered to my  satisfaction. . I give my informed consent for the services to be provided through the use of telemedicine in my medical care  By participating in this telemedicine visit I agree to the above.

## 2019-02-09 NOTE — Progress Notes (Signed)
Therapist, [facility] Consult Note Telephone: [phone]  Fax: [phone]  TELEHEALTH VISIT STATEMENT Due to the COVID-19 crisis, this visit was done via telemedicine from my office. It was initiated and consented to by this patient and/or family.  PATIENT NAME: Dylan ClintonJerry B Kempa DOB: [birth year] MRN: [id number]  PRIMARY CARE PROVIDER:   Jaclyn Shaggyate, [name], MD [phone]  REFERRING PROVIDER:  Jaclyn Shaggyate, [name], MD [address]   [place], [place] [address] [phone]  RESPONSIBLE PARTY:   Extended Emergency Contact Information Primary Emergency Contact: Miano,[name] Address: [address]          [place], [place] [address] [place] Mobile Phone: [phone] Relation: Spouse Secondary Emergency Contact: Deyo,[name]  [place] Home Phone: [phone] Mobile Phone: [phone] Relation: Son  Palliative Care was asked to follow patient by consultation request of Jaclyn Shaggyate, [name], MD. This is the follow up visit.  ASSESSMENT AND RECOMMENDATIONS:   1. Goals of Care: Maximize quality of life and symptom management. 4 Hospitalizations since February, goals of care include maintaining at home and improving his strength. Home health is currently in. .  2. Symptom Management:    Fatigue: Recommend home PT for strengthening. States he has no energy and can't get strong. He has home PT but is very debilitated. States he is out of the bed daily  with a hoyer lift. Patient had a hospice assessment last week but wife declined services. Patient remains appropriate for hospice should family change their mind. Potential for rehabilitation remains poor.  Pain: UTI in hospital, resolved now after therapy. Had disorientation due to it.   Neuro deficits:Endorses some hemiplegia. Had CT and MRI and found remote CVA. States he is weak but wants to improve.    Wound R foot: Being managed by podiatry.  Anxiety: Seems improved. His brother  passed away in the winter and this was leading to panic attacks at my last visit in March.  Insomnia: Seems improved, states he has a bipap and is getting used to sleeping with that. States he sleeps well.  3. Family Supports: Has family checking in and paid caregivers ATC. Patient is in living room in hospital bed.    4. Cognitive / Functional decline:  At cognitive baseline. Had disorientation when UTI was dx, now resolved.Appears more debilitated than in March but also appears more comfortable.  5. Advanced Care Directive: Has a living will and states  He does not want to be kept alive on a machine. Would like resuscitation but not to live on a machine.   Eating a heart healthy diet. States he has discussed his wishes with his family.  6. Follow up Palliative Care Visit: Palliative care will continue to follow for goals of care clarification and symptom management. Return 2 weeks or prn.  I spent 25 minutes providing this consultation,  from 1500 to 1525. More than 50% of the time in this consultation was spent coordinating communication.   HISTORY OF PRESENT ILLNESS:  Dylan ClintonJerry B Everman is a 83 y.o. year old male with multiple medical problems including CHF, recent left femur fracture, s/p left knee joint replacement, debility, obesity, anxiety disorder , PVD, CAD, s/p STEMI. Palliative Care was asked to help address goals of care.   CODE STATUS: Attempt to resuscitate but don't keep alive on machines. Full code  PPS: 30% HOSPICE ELIGIBILITY/DIAGNOSIS: yes, CHF, CAd  PAST MEDICAL HISTORY:  Past Medical History:  Diagnosis Date  .  CAD (coronary artery disease)   . Cervicalgia   . CHF (congestive heart failure) (HCC)   . COPD (chronic obstructive pulmonary disease) (HCC)   . Diastolic heart failure (HCC)   . Foot drop, right   . History of kidney stones   . Hyperlipidemia    unspecified  . Hypertension   . Myocardial infarction (HCC)   . Osteoarthritis   . Shoulder pain, left   .  Sleep apnea   . Tremor, essential     SOCIAL HX:  Social History   Tobacco Use  . Smoking status: Former Smoker    Packs/day: 0.25    Years: 58.00    Pack years: 14.50    Types: Cigarettes  . Smokeless tobacco: Former NeurosurgeonUser  . Tobacco comment: quit the begginning of septembet 2019  Substance Use Topics  . Alcohol use: No    Alcohol/week: 0.0 standard drinks    ALLERGIES: No Known Allergies   PERTINENT MEDICATIONS:  Outpatient Encounter Medications as of 02/09/2019  Medication Sig  . acetaminophen (TYLENOL) 500 MG tablet Take 1,000 mg by mouth 2 (two) times daily as needed for moderate pain.  Marland Kitchen. alum & mag hydroxide-simeth (MAALOX PLUS) 400-400-40 MG/5ML suspension Take 30 mLs by mouth every 4 (four) hours as needed for indigestion.  Marland Kitchen. amoxicillin-clavulanate (AUGMENTIN) 875-125 MG tablet Take 1 tablet by mouth every 12 (twelve) hours for 20 days.  . Ascorbic Acid (VITAMIN C PO) Take 500 mg by mouth daily.   Marland Kitchen. aspirin EC 81 MG tablet Take 81 mg by mouth daily.  Marland Kitchen. atorvastatin (LIPITOR) 80 MG tablet Take 80 mg by mouth daily.   . carvedilol (COREG) 6.25 MG tablet Take 6.25 mg by mouth 2 (two) times daily with a meal.  . Cholecalciferol (VITAMIN D-1000 MAX ST) 25 MCG (1000 UT) tablet Take 1 tablet (1,000 Units total) by mouth daily.  . folic acid (V-R FOLIC ACID) 400 MCG tablet Take 1 tablet (400 mcg total) by mouth daily.  . furosemide (LASIX) 40 MG tablet Take 40 mg by mouth 2 (two) times daily.  Marland Kitchen. gabapentin (NEURONTIN) 300 MG capsule Take 1 capsule (300 mg total) by mouth at bedtime.  Marland Kitchen. ipratropium-albuterol (DUONEB) 0.5-2.5 (3) MG/3ML SOLN Take 3 mLs by nebulization every 6 (six) hours as needed (shortness of breath).   . isosorbide mononitrate (IMDUR) 30 MG 24 hr tablet Take 30 mg by mouth daily.   Marland Kitchen. ketotifen (ZADITOR) 0.025 % ophthalmic solution Place 1 drop into both eyes as needed.  . loratadine (CLARITIN) 10 MG tablet Take 1 tablet (10 mg total) by mouth as needed for  allergies.  . magnesium hydroxide (MILK OF MAGNESIA) 400 MG/5ML suspension Take 30 mLs by mouth daily as needed for mild constipation.   . Multiple Vitamins-Minerals (CENTROVITE) TABS Take 1 tablet by mouth daily.  . Nicotine 10 MG/ML SOLN Place 1 spray into the nose as needed for smoking cessation.  . nitroGLYCERIN (NITROSTAT) 0.3 MG SL tablet Place 0.3 mg under the tongue every 5 (five) minutes as needed for chest pain.  . pantoprazole (PROTONIX) 40 MG tablet Take 1 tablet (40 mg total) by mouth daily.  . polyethylene glycol (MIRALAX / GLYCOLAX) packet Take 17 g by mouth 2 (two) times daily as needed. Hold for loose stools.  . potassium chloride SA (K-DUR) 20 MEQ tablet Take 1 tablet (20 mEq total) by mouth daily.  . predniSONE (DELTASONE) 20 MG tablet Take 1 tablet (20 mg total) by mouth daily. 1 po daily  .  primidone (MYSOLINE) 50 MG tablet Take 200 mg by mouth 2 (two) times daily.   . QUEtiapine (SEROQUEL) 50 MG tablet Take 1 tablet (50 mg total) by mouth at bedtime as needed.  . sennosides-docusate sodium (SENOKOT-S) 8.6-50 MG tablet Take 2 tablets by mouth daily as needed for constipation.  Marland Kitchen spironolactone (ALDACTONE) 25 MG tablet Take 12.5 mg by mouth daily.   . tamsulosin (FLOMAX) 0.4 MG CAPS capsule Take 1 capsule (0.4 mg total) by mouth daily.  . traZODone (DESYREL) 50 MG tablet Take 50 mg by mouth at bedtime.  Marland Kitchen umeclidinium-vilanterol (ANORO ELLIPTA) 62.5-25 MCG/INH AEPB Inhale 1 puff into the lungs as needed.   No facility-administered encounter medications on file as of 02/09/2019.     PHYSICAL EXAM/ROS:   Current and past weights: States 246 but 266 lb in Epic chart, was at 300 lbs at his peak.  General: NAD, frail appearing, obese, denies pain. In  A hospital bed in living room. Cardiovascular: no chest pain reported, no edema, using compression socks.  Pulmonary: no cough, no increased SOB, oxygen per [place], was on before but now is using ATC.Using Bipap nightly. Abdomen:  appetite good, endorses constipation,  incontinent of bowel GU: denies dysuria, incontinent of urine MSK:  Left leg fracture and knee replacement. Using hoyer lift to sit in chair Skin: no rashes, toe  wound cared for by home health. Saw podiatry in May. Reports improvement on antibiotics. Podiatry was concerned about osteomyelitis. Neurological: Weakness,reports CVA in may with left sided weakness, endorses fatigue, poor sleep.   Cyndia Skeeters DNP, AGPCNP-BC

## 2019-02-10 ENCOUNTER — Ambulatory Visit: Payer: Medicare Other | Attending: Family | Admitting: Family

## 2019-02-10 ENCOUNTER — Other Ambulatory Visit: Payer: Self-pay

## 2019-02-10 DIAGNOSIS — R531 Weakness: Secondary | ICD-10-CM

## 2019-02-10 DIAGNOSIS — I5022 Chronic systolic (congestive) heart failure: Secondary | ICD-10-CM

## 2019-02-10 DIAGNOSIS — I10 Essential (primary) hypertension: Secondary | ICD-10-CM

## 2019-02-10 DIAGNOSIS — J449 Chronic obstructive pulmonary disease, unspecified: Secondary | ICD-10-CM

## 2019-02-10 NOTE — Progress Notes (Signed)
Virtual Visit via Telephone Note   Evaluation Performed:  Follow-up visit  This visit type was conducted due to national recommendations for restrictions regarding the COVID-19 Pandemic (e.g. social distancing).  This format is felt to be most appropriate for this patient at this time.  All issues noted in this document were discussed and addressed.  No physical exam was performed (except for noted visual exam findings with Video Visits).  Please refer to the patient's chart (MyChart message for video visits and phone note for telephone visits) for the patient's consent to telehealth for [facility]  Date:  02/10/2019   ID:  Dylan [name], DOB 1936-02-11, MRN [id number]  Patient Location: [place] [address]   Provider location:   [facility] [place] [address], [place] [address]  PCP:  Albina Billet, MD  Cardiologist:  Lujean Amel, MD Electrophysiologist:  None   Chief Complaint:  fatigue  History of Present Illness:    Dylan [name] is a 83 y.o. male who presents via audio/video conferencing for a telehealth visit today.  Patient verified DOB and address.  The patient does not have symptoms concerning for COVID-19 infection (fever, chills, cough, or new SHORTNESS OF BREATH).   Patient reports moderate fatigue upon minimal exertion. He describes this as chronic in nature having been present for several months. He has associated weakness, dizziness, shortness of breath, cough and chronic difficulty sleeping along with this. He denies any swelling in his legs/ abdomen, palpitations, chest pain or change in his appetite. Says that he is still unable to support himself with his legs so can't weigh himself. Has PT coming along with an aide that is there during the day.   Prior CV studies:   The following studies were reviewed today:  Echo report from 11/18/2018 was reviewed and showed an EF of 35-40% along with moderate MR/ TR.    Past Medical History:  Diagnosis Date  . CAD (coronary artery disease)   . Cervicalgia   . CHF (congestive heart failure) ([place])   . COPD (chronic obstructive pulmonary disease) ([place])   . Diastolic heart failure ([place])   . Foot drop, right   . History of kidney stones   . Hyperlipidemia    unspecified  . Hypertension   . Myocardial infarction ([place])   . Osteoarthritis   . Shoulder pain, left   . Sleep apnea   . Tremor, essential    Past Surgical History:  Procedure Laterality Date  . APPLICATION OF WOUND VAC Right 11/13/2017   Procedure: APPLICATION OF WOUND VAC;  Surgeon: Algernon Huxley, MD;  Location: [facility];  Service: Vascular;  Laterality: Right;  . BACK SURGERY  06/2010  . CARDIAC CATHETERIZATION Left 04/30/2016   Procedure: Left Heart Cath and Coronary Angiography;  Surgeon: Dionisio David, MD;  Location: [facility];  Service: Cardiovascular;  Laterality: Left;  . COLONOSCOPY    . CORONARY ANGIOPLASTY    . CORONARY/GRAFT ACUTE MI REVASCULARIZATION N/A 06/21/2018   Procedure: Coronary/Graft Acute MI Revascularization;  Surgeon: Wellington Hampshire, MD;  Location: [facility];  Service: Cardiovascular;  Laterality: N/A;  . KNEE ARTHROPLASTY Left 06/01/2018   Procedure: COMPUTER ASSISTED TOTAL KNEE ARTHROPLASTY;  Surgeon: Dereck Leep, MD;  Location: [facility];  Service: Orthopedics;  Laterality: Left;  . KNEE ARTHROSCOPY Left   . KNEE SURGERY Left 1998  . LEFT HEART CATH AND CORONARY ANGIOGRAPHY N/A 06/21/2018  Procedure: LEFT HEART CATH AND CORONARY ANGIOGRAPHY;  Surgeon: Iran OuchArida, Muhammad A, MD;  Location: [facility];  Service: Cardiovascular;  Laterality: N/A;  . ORIF FEMUR FRACTURE Left 08/27/2018   Procedure: OPEN REDUCTION INTERNAL FIXATION (ORIF) SUPRACONDYLAR FEMUR FRACTURE ABOVE PROSTHESIS, QUADRICEPS REPAIR;  Surgeon: Kennedy BuckerMenz, Michael, MD;  Location: [facility];  Service: Orthopedics;  Laterality: Left;  . TEE WITHOUT CARDIOVERSION N/A 11/18/2018    Procedure: TRANSESOPHAGEAL ECHOCARDIOGRAM (TEE);  Surgeon: Lamar BlinksKowalski, Bruce J, MD;  Location: [facility];  Service: Cardiovascular;  Laterality: N/A;  . TONSILLECTOMY    . WOUND DEBRIDEMENT Right 11/13/2017   Procedure: DEBRIDEMENT WOUND;  Surgeon: Annice Needyew, Jason S, MD;  Location: [facility];  Service: Vascular;  Laterality: Right;     Current Meds  Medication Sig  . acetaminophen (TYLENOL) 500 MG tablet Take 1,000 mg by mouth 2 (two) times daily as needed for moderate pain.  Marland Kitchen. alum & mag hydroxide-simeth (MAALOX PLUS) 400-400-40 MG/5ML suspension Take 30 mLs by mouth every 4 (four) hours as needed for indigestion.  Marland Kitchen. amoxicillin-clavulanate (AUGMENTIN) 875-125 MG tablet Take 1 tablet by mouth every 12 (twelve) hours for 20 days.  . Ascorbic Acid (VITAMIN C PO) Take 500 mg by mouth daily.   Marland Kitchen. aspirin EC 81 MG tablet Take 81 mg by mouth daily.  Marland Kitchen. atorvastatin (LIPITOR) 80 MG tablet Take 80 mg by mouth daily.   . carvedilol (COREG) 6.25 MG tablet Take 6.25 mg by mouth 2 (two) times daily with a meal.  . Cholecalciferol (VITAMIN D-1000 MAX ST) 25 MCG (1000 UT) tablet Take 1 tablet (1,000 Units total) by mouth daily.  . folic acid (V-R FOLIC ACID) 400 MCG tablet Take 1 tablet (400 mcg total) by mouth daily.  . furosemide (LASIX) 40 MG tablet Take 40 mg by mouth 2 (two) times daily.  Marland Kitchen. gabapentin (NEURONTIN) 300 MG capsule Take 1 capsule (300 mg total) by mouth at bedtime.  Marland Kitchen. ipratropium-albuterol (DUONEB) 0.5-2.5 (3) MG/3ML SOLN Take 3 mLs by nebulization every 6 (six) hours as needed (shortness of breath).   . isosorbide mononitrate (IMDUR) 30 MG 24 hr tablet Take 30 mg by mouth daily.   Marland Kitchen. ketotifen (ZADITOR) 0.025 % ophthalmic solution Place 1 drop into both eyes as needed.  . loratadine (CLARITIN) 10 MG tablet Take 1 tablet (10 mg total) by mouth as needed for allergies.  . magnesium hydroxide (MILK OF MAGNESIA) 400 MG/5ML suspension Take 30 mLs by mouth daily as needed for mild constipation.   .  Multiple Vitamins-Minerals (CENTROVITE) TABS Take 1 tablet by mouth daily.  . nitroGLYCERIN (NITROSTAT) 0.3 MG SL tablet Place 0.3 mg under the tongue every 5 (five) minutes as needed for chest pain.  . pantoprazole (PROTONIX) 40 MG tablet Take 1 tablet (40 mg total) by mouth daily.  . polyethylene glycol (MIRALAX / GLYCOLAX) packet Take 17 g by mouth 2 (two) times daily as needed. Hold for loose stools.  . potassium chloride SA (K-DUR) 20 MEQ tablet Take 1 tablet (20 mEq total) by mouth daily.  . predniSONE (DELTASONE) 20 MG tablet Take 1 tablet (20 mg total) by mouth daily. 1 po daily  . primidone (MYSOLINE) 50 MG tablet Take 200 mg by mouth 2 (two) times daily.   . QUEtiapine (SEROQUEL) 50 MG tablet Take 1 tablet (50 mg total) by mouth at bedtime as needed.  . sennosides-docusate sodium (SENOKOT-S) 8.6-50 MG tablet Take 2 tablets by mouth daily as needed for constipation.  Marland Kitchen. spironolactone (ALDACTONE) 25 MG tablet  Take 12.5 mg by mouth daily.   . tamsulosin (FLOMAX) 0.4 MG CAPS capsule Take 1 capsule (0.4 mg total) by mouth daily.  . traZODone (DESYREL) 50 MG tablet Take 50 mg by mouth at bedtime.  Marland Kitchen. umeclidinium-vilanterol (ANORO ELLIPTA) 62.5-25 MCG/INH AEPB Inhale 1 puff into the lungs as needed.     Allergies:   Patient has no known allergies.   Social History   Tobacco Use  . Smoking status: Former Smoker    Packs/day: 0.25    Years: 58.00    Pack years: 14.50    Types: Cigarettes  . Smokeless tobacco: Former NeurosurgeonUser  . Tobacco comment: quit the begginning of septembet 2019  Substance Use Topics  . Alcohol use: No    Alcohol/week: 0.0 standard drinks  . Drug use: No     Family Hx: The patient's family history includes Bladder Cancer in his brother; Cancer in his mother; Colon cancer in his mother; Diabetes in his son; Heart disease in his father; Lung cancer in his mother; Tremor in his brother, father, and sister.  ROS:   Please see the history of present illness.     All  other systems reviewed and are negative.   Labs/Other Tests and Data Reviewed:    Recent Labs: 11/23/2018: B Natriuretic Peptide 850.0; TSH 4.119 01/30/2019: ALT 50; Magnesium 2.2 02/03/2019: BUN 39; Creatinine, Ser 0.93; Hemoglobin 10.3; Platelets 169; Potassium 3.7; Sodium 139   Recent Lipid Panel Lab Results  Component Value Date/Time   CHOL 92 02/02/2019 03:36 AM   TRIG 52 02/02/2019 03:36 AM   HDL 34 (L) 02/02/2019 03:36 AM   CHOLHDL 2.7 02/02/2019 03:36 AM   LDLCALC 48 02/02/2019 03:36 AM    Wt Readings from Last 3 Encounters:  02/03/19 266 lb 1.5 oz (120.7 kg)  11/29/18 280 lb 9.6 oz (127.3 kg)  11/19/18 274 lb 4.8 oz (124.4 kg)     Exam:    Vital Signs:  There were no vitals taken for this visit.   Well nourished, well developed male in no  acute distress.   ASSESSMENT & PLAN:    1. Chronic heart failure with reduced ejection fraction- - NYHA class III - euvolemic per patient's description of symptoms - unable to weigh safely at home due to patient's inability to support himself on his legs; currently must use hoyer lift to transfer patient - since he is unable to weigh daily, discussed importance of monitoring for swelling in his legs/ abdomen by pressing on skin or being aware if clothes are more tight fitting - not adding salt to his food and the aides/ family that cook for him also do not add salt while cooking - patient does not have a medication list available; says that his wife fixes his pill box and she works during the day - saw cardiology Juliann Pares([place]) 01/07/2019 - palliative care visit was done 02/09/2019 - participating in paramedic program - BNP 11/23/2018 was 850.0  2: HTN- - does not have a BP to report - he's unsure of when he last saw PCP Arlana Pouch([place])  - BMP from 11/30/2018 reviewed and showed sodium 142, potassium 3.7, creatinine 1.26 and GFR 53  3: COPD-  - wears oxygen at 2L at bedtime - getting used to wearing his bipap and says that he wears it ~  4-6 hours/ night - saw pulmonology Meredeth Ide([place]) 01/07/2019  4: Weakness- - patient was able to walk / stand December 2019 and has been unable to do so since that  time - currently is being transferred at home via a hoyer lift - PT coming in 3 times / week    COVID-19 Education: The signs and symptoms of COVID-19 were discussed with the patient and how to seek care for testing (follow up with PCP or arrange E-visit).  The importance of social distancing was discussed today.  Patient Risk:   After full review of this patients clinical status, I feel that they are at least moderate risk at this time.  Time:   Today, I have spent 7 minutes with the patient with telehealth technology discussing diet, activity and symptoms to report.     Medication Adjustments/Labs and Tests Ordered: Current medicines are reviewed at length with the patient today.  Concerns regarding medicines are outlined above.   Tests Ordered: No orders of the defined types were placed in this encounter.  Medication Changes: No orders of the defined types were placed in this encounter.   Disposition: Follow-up in 4 months or sooner for any questions/problems before then.   Signed, Delma Freezeina A Hackney, FNP  02/10/2019 12:48 PM    [facility]

## 2019-02-10 NOTE — Patient Instructions (Signed)
Monitor for any swelling in legs or abdomen by watching how closely your clothes fit.

## 2019-02-17 ENCOUNTER — Other Ambulatory Visit (HOSPITAL_COMMUNITY): Payer: Self-pay

## 2019-02-17 ENCOUNTER — Encounter (HOSPITAL_COMMUNITY): Payer: Self-pay

## 2019-02-17 NOTE — Progress Notes (Signed)
Today was a visit at home with Dylan [name].  He states feeling pretty good today.  His aid states he slept all night for first time in a long while.  He states felt great, used cpap all night.  He went out to doctors appt yesterday.  He states did good getting into wheel chair and into a van to go.  He has been feeling better, getting out of bed everyday and sitting in chair.  He has been in wheel chair and sat outside.  He still requires full assistance to move around.  He states arms feels like they are getting some strength in them.  He has PT coming out.  He states urine has been good.  Legs has compression stocking on them, swelling is down.  Plus 1 edema in them.  Appetite has been good.  Unable to weigh, unable to stand.  Meds are took care of by wife.  He watches his fluid intake.  He sates he has attitude now that he may get better. He is in good spirits today.  He appears more alert and telling stories today.  He denies pain, shortness of breath, headaches or dizziness.  He states breathing appears to be getting better.  His aid has left him without it a few times and takes longer and longer til his sats start to drop.  Lungs are clear.  Will continue to visit for heart failure.   [place] [phone]

## 2019-02-21 ENCOUNTER — Emergency Department: Payer: Medicare Other

## 2019-02-21 ENCOUNTER — Other Ambulatory Visit: Payer: Self-pay

## 2019-02-21 ENCOUNTER — Inpatient Hospital Stay: Payer: Medicare Other

## 2019-02-21 ENCOUNTER — Inpatient Hospital Stay
Admission: EM | Admit: 2019-02-21 | Discharge: 2019-02-24 | DRG: 871 | Disposition: A | Discharge status: Home Health | Payer: Medicare Other | Attending: Internal Medicine | Admitting: Internal Medicine

## 2019-02-21 ENCOUNTER — Encounter: Payer: Self-pay | Admitting: Emergency Medicine

## 2019-02-21 DIAGNOSIS — Z800 Family history of malignant neoplasm of digestive organs: Secondary | ICD-10-CM | POA: Diagnosis not present

## 2019-02-21 DIAGNOSIS — R0602 Shortness of breath: Secondary | ICD-10-CM | POA: Diagnosis present

## 2019-02-21 DIAGNOSIS — Z8052 Family history of malignant neoplasm of bladder: Secondary | ICD-10-CM

## 2019-02-21 DIAGNOSIS — Z20828 Contact with and (suspected) exposure to other viral communicable diseases: Secondary | ICD-10-CM | POA: Diagnosis present

## 2019-02-21 DIAGNOSIS — J440 Chronic obstructive pulmonary disease with acute lower respiratory infection: Secondary | ICD-10-CM | POA: Diagnosis present

## 2019-02-21 DIAGNOSIS — I2510 Atherosclerotic heart disease of native coronary artery without angina pectoris: Secondary | ICD-10-CM | POA: Diagnosis present

## 2019-02-21 DIAGNOSIS — G4730 Sleep apnea, unspecified: Secondary | ICD-10-CM | POA: Diagnosis present

## 2019-02-21 DIAGNOSIS — Z9861 Coronary angioplasty status: Secondary | ICD-10-CM | POA: Diagnosis not present

## 2019-02-21 DIAGNOSIS — M21371 Foot drop, right foot: Secondary | ICD-10-CM | POA: Diagnosis present

## 2019-02-21 DIAGNOSIS — Z6835 Body mass index (BMI) 35.0-35.9, adult: Secondary | ICD-10-CM

## 2019-02-21 DIAGNOSIS — Z87891 Personal history of nicotine dependence: Secondary | ICD-10-CM | POA: Diagnosis not present

## 2019-02-21 DIAGNOSIS — R0600 Dyspnea, unspecified: Secondary | ICD-10-CM

## 2019-02-21 DIAGNOSIS — K219 Gastro-esophageal reflux disease without esophagitis: Secondary | ICD-10-CM | POA: Diagnosis present

## 2019-02-21 DIAGNOSIS — Z8249 Family history of ischemic heart disease and other diseases of the circulatory system: Secondary | ICD-10-CM

## 2019-02-21 DIAGNOSIS — L89152 Pressure ulcer of sacral region, stage 2: Secondary | ICD-10-CM | POA: Diagnosis present

## 2019-02-21 DIAGNOSIS — A419 Sepsis, unspecified organism: Secondary | ICD-10-CM | POA: Diagnosis present

## 2019-02-21 DIAGNOSIS — J189 Pneumonia, unspecified organism: Secondary | ICD-10-CM | POA: Diagnosis present

## 2019-02-21 DIAGNOSIS — I5043 Acute on chronic combined systolic (congestive) and diastolic (congestive) heart failure: Secondary | ICD-10-CM | POA: Diagnosis present

## 2019-02-21 DIAGNOSIS — Z801 Family history of malignant neoplasm of trachea, bronchus and lung: Secondary | ICD-10-CM

## 2019-02-21 DIAGNOSIS — R0902 Hypoxemia: Secondary | ICD-10-CM

## 2019-02-21 DIAGNOSIS — I252 Old myocardial infarction: Secondary | ICD-10-CM | POA: Diagnosis not present

## 2019-02-21 DIAGNOSIS — Z96652 Presence of left artificial knee joint: Secondary | ICD-10-CM | POA: Diagnosis present

## 2019-02-21 DIAGNOSIS — J441 Chronic obstructive pulmonary disease with (acute) exacerbation: Secondary | ICD-10-CM | POA: Diagnosis present

## 2019-02-21 DIAGNOSIS — E669 Obesity, unspecified: Secondary | ICD-10-CM | POA: Diagnosis present

## 2019-02-21 DIAGNOSIS — Z833 Family history of diabetes mellitus: Secondary | ICD-10-CM

## 2019-02-21 DIAGNOSIS — Z9981 Dependence on supplemental oxygen: Secondary | ICD-10-CM

## 2019-02-21 DIAGNOSIS — E785 Hyperlipidemia, unspecified: Secondary | ICD-10-CM | POA: Diagnosis present

## 2019-02-21 DIAGNOSIS — Z7982 Long term (current) use of aspirin: Secondary | ICD-10-CM

## 2019-02-21 DIAGNOSIS — M1990 Unspecified osteoarthritis, unspecified site: Secondary | ICD-10-CM | POA: Diagnosis present

## 2019-02-21 DIAGNOSIS — I110 Hypertensive heart disease with heart failure: Secondary | ICD-10-CM | POA: Diagnosis present

## 2019-02-21 DIAGNOSIS — Z7951 Long term (current) use of inhaled steroids: Secondary | ICD-10-CM | POA: Diagnosis not present

## 2019-02-21 DIAGNOSIS — J9611 Chronic respiratory failure with hypoxia: Secondary | ICD-10-CM | POA: Diagnosis present

## 2019-02-21 DIAGNOSIS — J810 Acute pulmonary edema: Secondary | ICD-10-CM

## 2019-02-21 DIAGNOSIS — Z7401 Bed confinement status: Secondary | ICD-10-CM

## 2019-02-21 LAB — PROCALCITONIN: Procalcitonin: <0.1 ng/mL

## 2019-02-21 LAB — COMPREHENSIVE METABOLIC PANEL
ALT: 24 U/L (ref 0–44)
AST: 29 U/L (ref 15–41)
Albumin: 3 g/dL — ABNORMAL LOW (ref 3.5–5.0)
Alkaline Phosphatase: 119 U/L (ref 38–126)
Anion gap: 11 (ref 5–15)
BUN: 29 mg/dL — ABNORMAL HIGH (ref 8–23)
CO2: 27 mmol/L (ref 22–32)
Calcium: 9.1 mg/dL (ref 8.9–10.3)
Chloride: 101 mmol/L (ref 98–111)
Creatinine, Ser: 1.09 mg/dL (ref 0.61–1.24)
GFR calc Af Amer: >60 mL/min (ref >60)
GFR calc non Af Amer: >60 mL/min (ref >60)
Glucose, Bld: 155 mg/dL — ABNORMAL HIGH (ref 70–99)
Potassium: 4.8 mmol/L (ref 3.5–5.1)
Sodium: 139 mmol/L (ref 135–145)
Total Bilirubin: 1 mg/dL (ref 0.3–1.2)
Total Protein: 7.3 g/dL (ref 6.5–8.1)

## 2019-02-21 LAB — CBC WITH DIFFERENTIAL/PLATELET
Abs Immature Granulocytes: 0.09 10*3/uL — ABNORMAL HIGH (ref 0.00–0.07)
Basophils Absolute: 0.1 10*3/uL (ref 0.0–0.1)
Basophils Relative: 1 %
Eosinophils Absolute: 0.6 10*3/uL — ABNORMAL HIGH (ref 0.0–0.5)
Eosinophils Relative: 4 %
HCT: 38.8 % — ABNORMAL LOW (ref 39.0–52.0)
Hemoglobin: 12 g/dL — ABNORMAL LOW (ref 13.0–17.0)
Immature Granulocytes: 1 %
Lymphocytes Relative: 26 %
Lymphs Abs: 3.6 10*3/uL (ref 0.7–4.0)
MCH: 30.5 pg (ref 26.0–34.0)
MCHC: 30.9 g/dL (ref 30.0–36.0)
MCV: 98.7 fL (ref 80.0–100.0)
Monocytes Absolute: 1 10*3/uL (ref 0.1–1.0)
Monocytes Relative: 7 %
Neutro Abs: 8.8 10*3/uL — ABNORMAL HIGH (ref 1.7–7.7)
Neutrophils Relative %: 61 %
Platelets: 241 10*3/uL (ref 150–400)
RBC: 3.93 MIL/uL — ABNORMAL LOW (ref 4.22–5.81)
RDW: 17.5 % — ABNORMAL HIGH (ref 11.5–15.5)
WBC: 14.3 10*3/uL — ABNORMAL HIGH (ref 4.0–10.5)
nRBC: 0 % (ref 0.0–0.2)

## 2019-02-21 LAB — URINALYSIS, ROUTINE W REFLEX MICROSCOPIC
Bilirubin Urine: NEGATIVE
Glucose, UA: NEGATIVE mg/dL
Hgb urine dipstick: NEGATIVE
Ketones, ur: NEGATIVE mg/dL
Leukocytes,Ua: NEGATIVE
Nitrite: NEGATIVE
Protein, ur: 100 mg/dL — AB
Specific Gravity, Urine: 1.021 (ref 1.005–1.030)
pH: 5 (ref 5.0–8.0)

## 2019-02-21 LAB — GLUCOSE, CAPILLARY
Glucose-Capillary: 118 mg/dL — ABNORMAL HIGH (ref 70–99)
Glucose-Capillary: 124 mg/dL — ABNORMAL HIGH (ref 70–99)
Glucose-Capillary: 117 mg/dL — ABNORMAL HIGH (ref 70–99)

## 2019-02-21 LAB — LACTIC ACID, PLASMA: Lactic Acid, Venous: 1.5 mmol/L (ref 0.5–1.9)

## 2019-02-21 LAB — SARS CORONAVIRUS 2 BY RT PCR (HOSPITAL ORDER, PERFORMED IN ~~LOC~~ HOSPITAL LAB): SARS Coronavirus 2: NEGATIVE

## 2019-02-21 MED ORDER — METHYLPREDNISOLONE SODIUM SUCC 40 MG IJ SOLR
40.0000 mg | Freq: Two times a day (BID) | INTRAMUSCULAR | Status: DC
  Administered 2019-02-21 – 2019-02-24 (×7): 40 mg via INTRAVENOUS
  Filled 2019-02-21 (×8): qty 1

## 2019-02-21 MED ORDER — SODIUM CHLORIDE 0.9 % IV SOLN
2.0000 g | Freq: Three times a day (TID) | INTRAVENOUS | Status: DC
  Administered 2019-02-21 – 2019-02-24 (×9): 2 g via INTRAVENOUS
  Filled 2019-02-21 (×13): qty 2

## 2019-02-21 MED ORDER — SPIRONOLACTONE 25 MG PO TABS
12.5000 mg | ORAL_TABLET | Freq: Every day | ORAL | Status: DC
  Administered 2019-02-21 – 2019-02-24 (×4): 12.5 mg via ORAL
  Filled 2019-02-21: qty 0.5
  Filled 2019-02-21 (×2): qty 1
  Filled 2019-02-21: qty 0.5
  Filled 2019-02-21: qty 1
  Filled 2019-02-21 (×2): qty 0.5

## 2019-02-21 MED ORDER — ATORVASTATIN CALCIUM 20 MG PO TABS
80.0000 mg | ORAL_TABLET | Freq: Every day | ORAL | Status: DC
  Administered 2019-02-21 – 2019-02-24 (×4): 80 mg via ORAL
  Filled 2019-02-21 (×4): qty 4

## 2019-02-21 MED ORDER — VITAMIN C 500 MG PO TABS
500.0000 mg | ORAL_TABLET | Freq: Every day | ORAL | Status: DC
  Administered 2019-02-22 – 2019-02-24 (×3): 500 mg via ORAL
  Filled 2019-02-21 (×3): qty 1

## 2019-02-21 MED ORDER — ALUM & MAG HYDROXIDE-SIMETH 200-200-20 MG/5ML PO SUSP
30.0000 mL | ORAL | Status: DC | PRN
  Filled 2019-02-21 (×2): qty 30

## 2019-02-21 MED ORDER — TRAZODONE HCL 50 MG PO TABS
50.0000 mg | ORAL_TABLET | Freq: Every day | ORAL | Status: DC
  Administered 2019-02-21 – 2019-02-23 (×3): 50 mg via ORAL
  Filled 2019-02-21 (×3): qty 1

## 2019-02-21 MED ORDER — POLYETHYLENE GLYCOL 3350 17 G PO PACK: 17.0000 g | PACK | Freq: Two times a day (BID) | ORAL | Status: DC | PRN

## 2019-02-21 MED ORDER — ISOSORBIDE MONONITRATE ER 30 MG PO TB24
30.0000 mg | ORAL_TABLET | Freq: Every day | ORAL | Status: DC
  Administered 2019-02-21 – 2019-02-24 (×4): 30 mg via ORAL
  Filled 2019-02-21 (×4): qty 1

## 2019-02-21 MED ORDER — ASPIRIN EC 81 MG PO TBEC
81.0000 mg | DELAYED_RELEASE_TABLET | Freq: Every day | ORAL | Status: DC
  Administered 2019-02-21 – 2019-02-24 (×4): 81 mg via ORAL
  Filled 2019-02-21 (×4): qty 1

## 2019-02-21 MED ORDER — PANTOPRAZOLE SODIUM 40 MG PO TBEC
40.0000 mg | DELAYED_RELEASE_TABLET | ORAL | Status: DC
  Administered 2019-02-22 – 2019-02-24 (×3): 40 mg via ORAL
  Filled 2019-02-21 (×3): qty 1

## 2019-02-21 MED ORDER — ONDANSETRON HCL 4 MG/2ML IJ SOLN: 4.0000 mg | Freq: Four times a day (QID) | INTRAMUSCULAR | Status: DC | PRN

## 2019-02-21 MED ORDER — SENNOSIDES-DOCUSATE SODIUM 8.6-50 MG PO TABS: 2.0000 | ORAL_TABLET | Freq: Every day | ORAL | Status: DC | PRN

## 2019-02-21 MED ORDER — VANCOMYCIN HCL 1.5 G IV SOLR
1500.0000 mg | INTRAVENOUS | Status: DC
  Administered 2019-02-21 – 2019-02-22 (×2): 1500 mg via INTRAVENOUS
  Filled 2019-02-21 (×3): qty 1500

## 2019-02-21 MED ORDER — METRONIDAZOLE IN NACL 5-0.79 MG/ML-% IV SOLN
500.0000 mg | Freq: Once | INTRAVENOUS | Status: AC
  Administered 2019-02-21: 500 mg via INTRAVENOUS
  Filled 2019-02-21: qty 100

## 2019-02-21 MED ORDER — KETOTIFEN FUMARATE 0.025 % OP SOLN
1.0000 [drp] | OPHTHALMIC | Status: DC | PRN
  Filled 2019-02-21: qty 5

## 2019-02-21 MED ORDER — ACETAMINOPHEN 325 MG PO TABS
650.0000 mg | ORAL_TABLET | Freq: Four times a day (QID) | ORAL | Status: DC | PRN
  Administered 2019-02-21 (×2): 650 mg via ORAL
  Filled 2019-02-21 (×2): qty 2

## 2019-02-21 MED ORDER — QUETIAPINE FUMARATE 25 MG PO TABS: 50.0000 mg | ORAL_TABLET | Freq: Every evening | ORAL | Status: DC | PRN

## 2019-02-21 MED ORDER — INSULIN ASPART 100 UNIT/ML ~~LOC~~ SOLN
0.0000 [IU] | Freq: Three times a day (TID) | SUBCUTANEOUS | Status: DC
  Administered 2019-02-22 – 2019-02-24 (×3): 1 [IU] via SUBCUTANEOUS
  Filled 2019-02-21 (×4): qty 1

## 2019-02-21 MED ORDER — FUROSEMIDE 10 MG/ML IJ SOLN
40.0000 mg | Freq: Two times a day (BID) | INTRAMUSCULAR | Status: DC
  Administered 2019-02-21 – 2019-02-22 (×3): 40 mg via INTRAVENOUS
  Filled 2019-02-21 (×3): qty 4

## 2019-02-21 MED ORDER — PRIMIDONE 50 MG PO TABS
150.0000 mg | ORAL_TABLET | Freq: Two times a day (BID) | ORAL | Status: DC
  Administered 2019-02-21 – 2019-02-24 (×6): 150 mg via ORAL
  Filled 2019-02-21 (×7): qty 3

## 2019-02-21 MED ORDER — GABAPENTIN 300 MG PO CAPS
300.0000 mg | ORAL_CAPSULE | Freq: Every day | ORAL | Status: DC
  Administered 2019-02-21 – 2019-02-23 (×3): 300 mg via ORAL
  Filled 2019-02-21 (×3): qty 1

## 2019-02-21 MED ORDER — IOHEXOL 350 MG/ML SOLN
75.0000 mL | Freq: Once | INTRAVENOUS | Status: AC | PRN
  Administered 2019-02-21: 75 mL via INTRAVENOUS

## 2019-02-21 MED ORDER — ENOXAPARIN SODIUM 40 MG/0.4ML ~~LOC~~ SOLN
40.0000 mg | SUBCUTANEOUS | Status: DC
  Administered 2019-02-21 – 2019-02-22 (×2): 40 mg via SUBCUTANEOUS
  Filled 2019-02-21 (×2): qty 0.4

## 2019-02-21 MED ORDER — ONDANSETRON HCL 4 MG PO TABS: 4.0000 mg | ORAL_TABLET | Freq: Four times a day (QID) | ORAL | Status: DC | PRN

## 2019-02-21 MED ORDER — CARVEDILOL 3.125 MG PO TABS
6.2500 mg | ORAL_TABLET | Freq: Two times a day (BID) | ORAL | Status: DC
  Administered 2019-02-21 – 2019-02-24 (×7): 6.25 mg via ORAL
  Filled 2019-02-21 (×7): qty 2

## 2019-02-21 MED ORDER — SACUBITRIL-VALSARTAN 24-26 MG PO TABS
1.0000 | ORAL_TABLET | Freq: Two times a day (BID) | ORAL | Status: DC
  Administered 2019-02-21 – 2019-02-24 (×7): 1 via ORAL
  Filled 2019-02-21 (×9): qty 1

## 2019-02-21 MED ORDER — VITAMIN D 25 MCG (1000 UNIT) PO TABS
1000.0000 [IU] | ORAL_TABLET | Freq: Every day | ORAL | Status: DC
  Administered 2019-02-22 – 2019-02-24 (×3): 1000 [IU] via ORAL
  Filled 2019-02-21 (×3): qty 1

## 2019-02-21 MED ORDER — BUDESONIDE 0.5 MG/2ML IN SUSP
0.5000 mg | Freq: Two times a day (BID) | RESPIRATORY_TRACT | Status: DC
  Administered 2019-02-21 – 2019-02-24 (×7): 0.5 mg via RESPIRATORY_TRACT
  Filled 2019-02-21 (×9): qty 2

## 2019-02-21 MED ORDER — FUROSEMIDE 10 MG/ML IJ SOLN
60.0000 mg | Freq: Once | INTRAMUSCULAR | Status: AC
  Administered 2019-02-21: 60 mg via INTRAVENOUS
  Filled 2019-02-21: qty 8

## 2019-02-21 MED ORDER — SODIUM CHLORIDE 0.9 % IV BOLUS
1000.0000 mL | Freq: Once | INTRAVENOUS | Status: AC
  Administered 2019-02-21: 1000 mL via INTRAVENOUS

## 2019-02-21 MED ORDER — IPRATROPIUM-ALBUTEROL 0.5-2.5 (3) MG/3ML IN SOLN
3.0000 mL | Freq: Four times a day (QID) | RESPIRATORY_TRACT | Status: DC
  Administered 2019-02-21 – 2019-02-24 (×13): 3 mL via RESPIRATORY_TRACT
  Filled 2019-02-21 (×14): qty 3

## 2019-02-21 MED ORDER — CLONIDINE ORAL SUSPENSION 10 MCG/ML
0.1000 mg | Freq: Once | ORAL | Status: DC
  Filled 2019-02-21: qty 10

## 2019-02-21 MED ORDER — INSULIN ASPART 100 UNIT/ML ~~LOC~~ SOLN: 0.0000 [IU] | Freq: Every day | SUBCUTANEOUS | Status: DC

## 2019-02-21 MED ORDER — POTASSIUM CHLORIDE CRYS ER 20 MEQ PO TBCR
20.0000 meq | EXTENDED_RELEASE_TABLET | Freq: Every day | ORAL | Status: DC
  Administered 2019-02-22: 20 meq via ORAL
  Filled 2019-02-21 (×2): qty 1

## 2019-02-21 MED ORDER — ACETAMINOPHEN 650 MG RE SUPP: 650.0000 mg | Freq: Four times a day (QID) | RECTAL | Status: DC | PRN

## 2019-02-21 MED ORDER — NITROGLYCERIN 0.4 MG SL SUBL: 0.4000 mg | SUBLINGUAL_TABLET | SUBLINGUAL | Status: DC | PRN

## 2019-02-21 MED ORDER — FOLIC ACID 1 MG PO TABS
500.0000 ug | ORAL_TABLET | Freq: Every day | ORAL | Status: DC
  Administered 2019-02-22 – 2019-02-24 (×3): 0.5 mg via ORAL
  Filled 2019-02-21 (×3): qty 1

## 2019-02-21 MED ORDER — LORATADINE 10 MG PO TABS: 10.0000 mg | ORAL_TABLET | ORAL | Status: DC | PRN

## 2019-02-21 MED ORDER — VANCOMYCIN HCL IN DEXTROSE 1-5 GM/200ML-% IV SOLN
1000.0000 mg | Freq: Once | INTRAVENOUS | Status: AC
  Administered 2019-02-21: 1000 mg via INTRAVENOUS
  Filled 2019-02-21: qty 200

## 2019-02-21 MED ORDER — SODIUM CHLORIDE 0.9 % IV SOLN
2.0000 g | Freq: Once | INTRAVENOUS | Status: AC
  Administered 2019-02-21: 2 g via INTRAVENOUS
  Filled 2019-02-21: qty 2

## 2019-02-21 MED ORDER — ADULT MULTIVITAMIN W/MINERALS CH
1.0000 | ORAL_TABLET | Freq: Every day | ORAL | Status: DC
  Administered 2019-02-22 – 2019-02-24 (×3): 1 via ORAL
  Filled 2019-02-21 (×3): qty 1

## 2019-02-21 MED ORDER — MAGNESIUM HYDROXIDE 400 MG/5ML PO SUSP
30.0000 mL | Freq: Every day | ORAL | Status: DC | PRN
  Filled 2019-02-21: qty 30

## 2019-02-21 NOTE — ED Triage Notes (Signed)
Patient presents to the ED via EMS from home for shortness of breath and fever.  Patient recently was placed on antibiotics for a UTI per EMS. Patient's wife reports fever this morning.  Temp per EMS was 99.1.  Patient denies known Covid-19 contacts.  Patient is diaphoretic at this time and appears pale.  Using accessory muscles to breathe.  Patient is normally on 2L of oxygen [place] at home.  Was bumped up to 3L by family.

## 2019-02-21 NOTE — ED Notes (Signed)
Report given to Meghan RN

## 2019-02-21 NOTE — ED Notes (Signed)
Transferred to room 107 by this tech.

## 2019-02-21 NOTE — Progress Notes (Signed)
PHARMACY -  BRIEF ANTIBIOTIC NOTE   Pharmacy has received consult(s) for Vancomycin and Cefepime from an ED provider.  The patient's profile has been reviewed for ht/wt/allergies/indication/available labs.    One time order(s) placed for Cefepime and Vancomycin   Further antibiotics/pharmacy consults should be ordered by admitting physician if indicated.                       Thank you, Simpson,Michael L 02/21/2019  9:54 AM

## 2019-02-21 NOTE — Progress Notes (Signed)
CODE SEPSIS - PHARMACY COMMUNICATION  **Broad Spectrum Antibiotics should be administered within 1 hour of Sepsis diagnosis**  Time Code Sepsis Called/Page Received: 6387  Antibiotics Ordered: Cefepime/Metronidazole/Vancomycin  Time of 1st antibiotic administration: Cefepime 0943  Lillymae Duet L , RPh 02/21/2019  8:55 AM

## 2019-02-21 NOTE — ED Notes (Signed)
Pt assisted to use urinal and wiped off- pt given lunch tray- pt states he does not want lunch right now- instructed to use call bell if he would like help eating later

## 2019-02-21 NOTE — Progress Notes (Signed)
Pharmacy Antibiotic Note  Dylan [name] is a 83 y.o. male admitted on 02/21/2019 with sepsis.  Pharmacy has been consulted for vancomycin and cefepime dosing. Possible left lower lobe PNA. MRSA PCR already ordered by MD. Vancomycin 1g and cefepime 2g given in the ED.   Plan: Will continue dosing with cefepime 2 g IV q8h.   Vancomycin 1500 mg IV Q 24 hrs to start about 6h after first dose as pt did not receive load. Goal AUC 400-550. Expected AUC: 538 SCr used: 1.09 Cmin expected: 12.5  Continue to monitor renal function. BMP already ordered by MD for tomorrow.  F/u MRSA PCR   Height: 5\' 10"  (177.8 cm) Weight: 246 lb (111.6 kg) IBW/kg (Calculated) : 73  Temp (24hrs), Avg:99.1 F (37.3 C), Min:99.1 F (37.3 C), Max:99.1 F (37.3 C)  Recent Labs  Lab 02/21/19 0815  WBC 14.3*  CREATININE 1.09  LATICACIDVEN 1.5    Estimated Creatinine Clearance: 66.5 mL/min (by C-G formula based on SCr of 1.09 mg/dL).    No Known Allergies  Antimicrobials this admission: Cefepime 6/21 >> vanc 6/21 >>  Dose adjustments this admission:   Microbiology results: 6/21 BCx: pending 6/21 UCx: collected  MRSA PCR: ordered  Thank you for allowing pharmacy to be a part of this patient's care.  Rocky Morel 02/21/2019 1:17 PM

## 2019-02-21 NOTE — H&P (Addendum)
[facility]   PATIENT NAME: Dylan [name] Bartolome    MR#:  [id number]  DATE OF BIRTH:  05-05-1936  DATE OF ADMISSION:  02/21/2019  PRIMARY CARE PHYSICIAN: Jaclyn Shaggyate, [name], MD   REQUESTING/REFERRING PHYSICIAN: Dr Minna AntisKevin Paduchowski  CHIEF COMPLAINT:   Chief Complaint  Patient presents with  . Shortness of Breath  . Fever    HISTORY OF PRESENT ILLNESS:  Dylan [name] Dolinski  is a 83 y.o. male with a known history of COPD on chronic oxygen and CHF presents with shortness of breath and fever.  He states her shortness of breath got worse yesterday.  No cough.  He states if he lies still he breathes okay but if any little movement he gets short of breath.  Patient states that he has not walked in about 6 months since he broke his leg.  Slight fever.  Slight sweats.  Decreased appetite.  In the ER, he had a low-grade temperature and elevated white count and hospitalist services were contacted for sepsis protocol.  Chest x-ray possibility of a left lower lobe pneumonia.  Also could have congestive heart failure on the chest x-ray.  PAST MEDICAL HISTORY:   Past Medical History:  Diagnosis Date  . CAD (coronary artery disease)   . Cervicalgia   . CHF (congestive heart failure) (HCC)   . COPD (chronic obstructive pulmonary disease) (HCC)   . Diastolic heart failure (HCC)   . Foot drop, right   . History of kidney stones   . Hyperlipidemia    unspecified  . Hypertension   . Myocardial infarction (HCC)   . Osteoarthritis   . Shoulder pain, left   . Sleep apnea   . Tremor, essential     PAST SURGICAL HISTORY:   Past Surgical History:  Procedure Laterality Date  . APPLICATION OF WOUND VAC Right 11/13/2017   Procedure: APPLICATION OF WOUND VAC;  Surgeon: Annice Needyew, Jason S, MD;  Location: [facility];  Service: Vascular;  Laterality: Right;  . BACK SURGERY  06/2010  . CARDIAC CATHETERIZATION Left 04/30/2016   Procedure: Left Heart Cath and Coronary Angiography;   Surgeon: Laurier NancyShaukat A Khan, MD;  Location: [facility];  Service: Cardiovascular;  Laterality: Left;  . COLONOSCOPY    . CORONARY ANGIOPLASTY    . CORONARY/GRAFT ACUTE MI REVASCULARIZATION N/A 06/21/2018   Procedure: Coronary/Graft Acute MI Revascularization;  Surgeon: Iran OuchArida, Muhammad A, MD;  Location: [facility];  Service: Cardiovascular;  Laterality: N/A;  . KNEE ARTHROPLASTY Left 06/01/2018   Procedure: COMPUTER ASSISTED TOTAL KNEE ARTHROPLASTY;  Surgeon: Donato HeinzHooten, James P, MD;  Location: [facility];  Service: Orthopedics;  Laterality: Left;  . KNEE ARTHROSCOPY Left   . KNEE SURGERY Left 1998  . LEFT HEART CATH AND CORONARY ANGIOGRAPHY N/A 06/21/2018   Procedure: LEFT HEART CATH AND CORONARY ANGIOGRAPHY;  Surgeon: Iran OuchArida, Muhammad A, MD;  Location: [facility];  Service: Cardiovascular;  Laterality: N/A;  . ORIF FEMUR FRACTURE Left 08/27/2018   Procedure: OPEN REDUCTION INTERNAL FIXATION (ORIF) SUPRACONDYLAR FEMUR FRACTURE ABOVE PROSTHESIS, QUADRICEPS REPAIR;  Surgeon: Kennedy BuckerMenz, Michael, MD;  Location: [facility];  Service: Orthopedics;  Laterality: Left;  . TEE WITHOUT CARDIOVERSION N/A 11/18/2018   Procedure: TRANSESOPHAGEAL ECHOCARDIOGRAM (TEE);  Surgeon: Lamar BlinksKowalski, Bruce J, MD;  Location: [facility];  Service: Cardiovascular;  Laterality: N/A;  . TONSILLECTOMY    . WOUND DEBRIDEMENT Right 11/13/2017   Procedure: DEBRIDEMENT WOUND;  Surgeon: Annice Needyew, Jason S, MD;  Location: [facility];  Service: Vascular;  Laterality: Right;    SOCIAL HISTORY:   Social History   Tobacco Use  . Smoking status: Former Smoker    Packs/day: 0.25    Years: 58.00    Pack years: 14.50    Types: Cigarettes  . Smokeless tobacco: Former NeurosurgeonUser  . Tobacco comment: quit the begginning of septembet 2019  Substance Use Topics  . Alcohol use: No    Alcohol/week: 0.0 standard drinks    FAMILY HISTORY:   Family History  Problem Relation Age of Onset  . Diabetes Son   . Cancer Mother   . Colon cancer  Mother   . Lung cancer Mother   . Tremor Father   . Heart disease Father   . Tremor Brother   . Bladder Cancer Brother   . Tremor Sister     DRUG ALLERGIES:  No Known Allergies  REVIEW OF SYSTEMS:  CONSTITUTIONAL: Positive sweating and fever.positive for fatigue and weakness.  EYES: No blurred or double vision.  EARS, NOSE, AND THROAT: No tinnitus or ear pain. No sore throat.  Positive for runny nose RESPIRATORY: No cough.  Positive for shortness of breath.  No wheezing or hemoptysis.  CARDIOVASCULAR: No chest pain, orthopnea, edema.  GASTROINTESTINAL: No nausea, vomiting, diarrhea or abdominal pain. No blood in bowel movements.  Positive for constipation GENITOURINARY: No dysuria, hematuria.  ENDOCRINE: No polyuria, nocturia,  HEMATOLOGY: No anemia, easy bruising or bleeding SKIN: No rash or lesion. MUSCULOSKELETAL: Some joint pain NEUROLOGIC: No tingling, numbness, weakness.  PSYCHIATRY: No anxiety or depression.   MEDICATIONS AT HOME:   Prior to Admission medications   Medication Sig Start Date End Date Taking? Authorizing Provider  Ascorbic Acid (VITAMIN C PO) Take 500 mg by mouth daily.    Yes [provider]  aspirin EC 81 MG tablet Take 81 mg by mouth daily.   Yes [provider]  atorvastatin (LIPITOR) 80 MG tablet Take 80 mg by mouth daily.  09/04/18  Yes [provider]  carvedilol (COREG) 6.25 MG tablet Take 6.25 mg by mouth 2 (two) times daily with a meal. 09/07/18  Yes [provider]  Cholecalciferol (VITAMIN D-1000 MAX ST) 25 MCG (1000 UT) tablet Take 1 tablet (1,000 Units total) by mouth daily. 12/24/18  Yes Clarisa KindredHackney, Tina A, FNP  folic acid (V-R FOLIC ACID) 400 MCG tablet Take 1 tablet (400 mcg total) by mouth daily. 12/24/18  Yes Clarisa KindredHackney, Tina A, FNP  furosemide (LASIX) 40 MG tablet Take 40 mg by mouth 2 (two) times daily.   Yes [provider]  gabapentin (NEURONTIN) 300 MG capsule Take 1 capsule (300 mg total) by mouth at  bedtime. 12/24/18  Yes Clarisa KindredHackney, Tina A, FNP  isosorbide mononitrate (IMDUR) 30 MG 24 hr tablet Take 30 mg by mouth daily.  05/31/16  Yes [provider]  Multiple Vitamins-Minerals (CENTROVITE) TABS Take 1 tablet by mouth daily.   Yes [provider]  pantoprazole (PROTONIX) 40 MG tablet Take 1 tablet (40 mg total) by mouth daily. 12/01/18  Yes [name], Evelena AsaMontell D, MD  potassium chloride SA (K-DUR) 20 MEQ tablet Take 1 tablet (20 mEq total) by mouth daily. 12/24/18  Yes Hackney, Inetta Fermoina A, FNP  predniSONE (DELTASONE) 20 MG tablet Take 1 tablet (20 mg total) by mouth daily. 1 po daily 12/24/18  Yes Hackney, Tina A, FNP  primidone (MYSOLINE) 50 MG tablet Take 150 mg by mouth 2 (two) times daily.    Yes [provider]  QUEtiapine (SEROQUEL)  50 MG tablet Take 1 tablet (50 mg total) by mouth at bedtime as needed. 12/24/18  Yes Hackney, Tina A, FNP  sacubitril-valsartan (ENTRESTO) 24-26 MG Take 1 tablet by mouth every 12 (twelve) hours. 02/16/19  Yes [provider]  spironolactone (ALDACTONE) 25 MG tablet Take 12.5 mg by mouth daily.  09/04/18  Yes [provider]  traZODone (DESYREL) 50 MG tablet Take 50 mg by mouth at bedtime. 01/07/19 01/07/20 Yes [provider]  umeclidinium-vilanterol (ANORO ELLIPTA) 62.5-25 MCG/INH AEPB Inhale 1 puff into the lungs as needed. 12/24/18  Yes Clarisa KindredHackney, Tina A, FNP  acetaminophen (TYLENOL) 500 MG tablet Take 1,000 mg by mouth 2 (two) times daily as needed for moderate pain.    [provider]  alum & mag hydroxide-simeth (MAALOX PLUS) 400-400-40 MG/5ML suspension Take 30 mLs by mouth every 4 (four) hours as needed for indigestion. 09/04/18   [provider]  ipratropium-albuterol (DUONEB) 0.5-2.5 (3) MG/3ML SOLN Take 3 mLs by nebulization every 6 (six) hours as needed (shortness of breath).     [provider]  ketotifen (ZADITOR) 0.025 % ophthalmic solution Place 1 drop into both eyes as needed. 12/24/18    Delma FreezeHackney, Tina A, FNP  loratadine (CLARITIN) 10 MG tablet Take 1 tablet (10 mg total) by mouth as needed for allergies. 12/24/18   Delma FreezeHackney, Tina A, FNP  magnesium hydroxide (MILK OF MAGNESIA) 400 MG/5ML suspension Take 30 mLs by mouth daily as needed for mild constipation.     [provider]  nitroGLYCERIN (NITROSTAT) 0.3 MG SL tablet Place 0.3 mg under the tongue every 5 (five) minutes as needed for chest pain.    [provider]  polyethylene glycol (MIRALAX / GLYCOLAX) packet Take 17 g by mouth 2 (two) times daily as needed. Hold for loose stools. 09/29/18   [provider]  sennosides-docusate sodium (SENOKOT-S) 8.6-50 MG tablet Take 2 tablets by mouth daily as needed for constipation. 12/24/18   Delma FreezeHackney, Tina A, FNP      VITAL SIGNS:  Blood pressure (!) 171/119, pulse 89, temperature 99.1 F (37.3 C), temperature source Oral, resp. rate (!) 33, height 5\' 10"  (1.778 m), weight 111.6 kg, SpO2 100 %.  PHYSICAL EXAMINATION:  GENERAL:  83 y.o.-year-old patient lying in the bed with no acute distress.  EYES: Pupils equal, round, reactive to light and accommodation. No scleral icterus. Extraocular muscles intact.  HEENT: Head atraumatic, normocephalic. Oropharynx and nasopharynx clear.  NECK:  Supple, no jugular venous distention. No thyroid enlargement, no tenderness.  LUNGS: Normal breath sounds bilaterally, no wheezing, rales,rhonchi or crepitation. No use of accessory muscles of respiration.  CARDIOVASCULAR: S1, S2 normal. No murmurs, rubs, or gallops.  ABDOMEN: Soft, nontender, nondistended. Bowel sounds present. No organomegaly or mass.  EXTREMITIES: No pedal edema, cyanosis, or clubbing.  NEUROLOGIC: Cranial nerves II through XII are intact. Muscle strength 5/5 in all extremities. Sensation intact. Gait not checked.  PSYCHIATRIC: The patient is alert and oriented x 3.  SKIN: Slight redness and slight skin breakdown in the sacral area.  Stage II  decubiti.  LABORATORY PANEL:   CBC Recent Labs  Lab 02/21/19 0815  WBC 14.3*  HGB 12.0*  HCT 38.8*  PLT 241   ------------------------------------------------------------------------------------------------------------------  Chemistries  Recent Labs  Lab 02/21/19 0815  NA 139  K 4.8  CL 101  CO2 27  GLUCOSE 155*  BUN 29*  CREATININE 1.09  CALCIUM 9.1  AST 29  ALT 24  ALKPHOS 119  BILITOT 1.0   ------------------------------------------------------------------------------------------------------------------  RADIOLOGY:  Dg Chest Portable 1 View  Result Date: 02/21/2019 CLINICAL DATA:  Fever, cough and shortness of breath. EXAM: PORTABLE CHEST 1 VIEW COMPARISON:  01/30/2019 FINDINGS: Stable cardiomegaly. Stable densities at the left lung base that could represent consolidation and/or pleural fluid. Hazy densities throughout both lungs are suggestive for mild edema. Evidence for small right pleural effusion. Atherosclerotic calcifications at the aortic arch. IMPRESSION: Chest radiograph findings are similar to the exam on 01/30/2019. Cardiomegaly with bilateral pleural effusions and probable pulmonary edema. Opacification at the left lung base could represent pleural fluid and/or consolidation. Electronically Signed   By: Markus Daft M.D.   On: 02/21/2019 09:50    EKG:   Sinus tachycardia 108 bpm, left bundle branch block  IMPRESSION AND PLAN:   1.  Clinical sepsis with fever, leukocytosis, tachycardia and possible pneumonia left lower lobe.  Urine analysis still pending.  Follow-up cultures.  Aggressive antibiotics with vancomycin and cefepime.  Send off an MRSA PCR.  First procalcitonin negative. 2.  Acute on chronic systolic congestive heart failure.  Patient given a dose of Lasix in the ER will continue IV Lasix.  Patient on spironolactone, Coreg and Entresto which I will continue. 3.  Nonambulatory.  CT scan of the chest to rule out pulmonary embolism 4.   Chronic respiratory failure.  Continue oxygen 5.  COPD exacerbation.  Start Solu-Medrol nebulizer treatments 6.  Obesity with a BMI of 35.3.  Weight loss needed 7.  Hyperlipidemia unspecified on atorvastatin 8.  GERD on PPI 9.  Stage II sacral decubitus  All the records are reviewed and case discussed with ED provider. Management plans discussed with the patient, family and they are in agreement.  CODE STATUS: Full code  TOTAL TIME TAKING CARE OF THIS PATIENT: 50 minutes, including acp time.    Loletha Grayer M.D on 02/21/2019 at 11:35 AM  Between 7am to 6pm - Pager - [phone]  After 6pm call admission pager [phone]  [facility]  [phone]  CC: Primary care physician; Albina Billet, MD

## 2019-02-21 NOTE — ED Notes (Signed)
ED TO INPATIENT HANDOFF REPORT  ED Nurse Name and Phone #: [place] [phone]  S Name/Age/Gender Kateri Mc 83 y.o. male Room/Bed: ED08A/ED08A  Code Status   Code Status: Full Code  Home/SNF/Other Home Patient oriented to: self, place, time and situation Is this baseline? Yes   Triage Complete: Triage complete  Chief Complaint Difficulty Breathing  Triage Note Patient presents to the ED via EMS from home for shortness of breath and fever.  Patient recently was placed on antibiotics for a UTI per EMS. Patient's wife reports fever this morning.  Temp per EMS was 99.1.  Patient denies known Covid-19 contacts.  Patient is diaphoretic at this time and appears pale.  Using accessory muscles to breathe.  Patient is normally on 2L of oxygen [place] at home.  Was bumped up to 3L by family.     Allergies No Known Allergies  Level of Care/Admitting Diagnosis ED Disposition    ED Disposition Condition Accident Hospital Area: [place] [[id number]]  Level of Care: Med-Surg [16]  Covid Evaluation: N/A  Diagnosis: Sepsis [facility]) [[id number]]  Admitting Physician: Loletha Grayer [[id number]]  Attending Physician: Loletha Grayer [phone]  Estimated length of stay: past midnight tomorrow  Certification:: I certify this patient will need inpatient services for at least 2 midnights  PT Class (Do Not Modify): Inpatient [101]  PT Acc Code (Do Not Modify): Private [1]       B Medical/Surgery History Past Medical History:  Diagnosis Date  . CAD (coronary artery disease)   . Cervicalgia   . CHF (congestive heart failure) ([place])   . COPD (chronic obstructive pulmonary disease) ([place])   . Diastolic heart failure ([place])   . Foot drop, right   . History of kidney stones   . Hyperlipidemia    unspecified  . Hypertension   . Myocardial infarction ([place])   . Osteoarthritis   . Shoulder pain, left   . Sleep apnea   . Tremor, essential    Past Surgical History:  Procedure  Laterality Date  . APPLICATION OF WOUND VAC Right 11/13/2017   Procedure: APPLICATION OF WOUND VAC;  Surgeon: Algernon Huxley, MD;  Location: [facility] ORS;  Service: Vascular;  Laterality: Right;  . BACK SURGERY  06/2010  . CARDIAC CATHETERIZATION Left 04/30/2016   Procedure: Left Heart Cath and Coronary Angiography;  Surgeon: Dionisio David, MD;  Location: [place] CV LAB;  Service: Cardiovascular;  Laterality: Left;  . COLONOSCOPY    . CORONARY ANGIOPLASTY    . CORONARY/GRAFT ACUTE MI REVASCULARIZATION N/A 06/21/2018   Procedure: Coronary/Graft Acute MI Revascularization;  Surgeon: Wellington Hampshire, MD;  Location: [place] [facility] LAB;  Service: Cardiovascular;  Laterality: N/A;  . KNEE ARTHROPLASTY Left 06/01/2018   Procedure: COMPUTER ASSISTED TOTAL KNEE ARTHROPLASTY;  Surgeon: Dereck Leep, MD;  Location: [facility] ORS;  Service: Orthopedics;  Laterality: Left;  . KNEE ARTHROSCOPY Left   . KNEE SURGERY Left 1998  . LEFT HEART CATH AND CORONARY ANGIOGRAPHY N/A 06/21/2018   Procedure: LEFT HEART CATH AND CORONARY ANGIOGRAPHY;  Surgeon: Wellington Hampshire, MD;  Location: [place] CV LAB;  Service: Cardiovascular;  Laterality: N/A;  . ORIF FEMUR FRACTURE Left 08/27/2018   Procedure: OPEN REDUCTION INTERNAL FIXATION (ORIF) SUPRACONDYLAR FEMUR FRACTURE ABOVE PROSTHESIS, QUADRICEPS REPAIR;  Surgeon: Hessie Knows, MD;  Location: [facility] ORS;  Service: Orthopedics;  Laterality: Left;  . TEE WITHOUT CARDIOVERSION N/A 11/18/2018   Procedure: TRANSESOPHAGEAL ECHOCARDIOGRAM (TEE);  Surgeon: Serafina Royals  J, MD;  Location: [facility] ORS;  Service: Cardiovascular;  Laterality: N/A;  . TONSILLECTOMY    . WOUND DEBRIDEMENT Right 11/13/2017   Procedure: DEBRIDEMENT WOUND;  Surgeon: Annice Needy, MD;  Location: [facility] ORS;  Service: Vascular;  Laterality: Right;     A IV Location/Drains/Wounds Patient Lines/Drains/Airways Status   Active Line/Drains/Airways    Name:   Placement date:   Placement time:   Site:    Days:   Peripheral IV 02/21/19 Right Forearm   02/21/19    0856    Forearm   less than 1   Peripheral IV 02/21/19 Right Antecubital   02/21/19    8119    Antecubital   less than 1   Wound / Incision (Open or Dehisced) 01/31/19 Other (Comment) Toe (Comment  which one) Right   01/31/19    1030    Toe (Comment  which one)   21          Intake/Output Last 24 hours  Intake/Output Summary (Last 24 hours) at 02/21/2019 1407 Last data filed at 02/21/2019 1341 Gross per 24 hour  Intake 1400 ml  Output -  Net 1400 ml    Labs/Imaging Results for orders placed or performed during the hospital encounter of 02/21/19 (from the past 48 hour(s))  Lactic acid, plasma     Status: None   Collection Time: 02/21/19  8:15 AM  Result Value Ref Range   Lactic Acid, Venous 1.5 0.5 - 1.9 mmol/L    Comment: Performed at [facility], [address] [place], [place] [address]  Comprehensive metabolic panel     Status: Abnormal   Collection Time: 02/21/19  8:15 AM  Result Value Ref Range   Sodium 139 135 - 145 mmol/L   Potassium 4.8 3.5 - 5.1 mmol/L   Chloride 101 98 - 111 mmol/L   CO2 27 22 - 32 mmol/L   Glucose, Bld 155 (H) 70 - 99 mg/dL   BUN 29 (H) 8 - 23 mg/dL   Creatinine, Ser 9.56 0.61 - 1.24 mg/dL   Calcium 9.1 8.9 - 21.3 mg/dL   Total Protein 7.3 6.5 - 8.1 g/dL   Albumin 3.0 (L) 3.5 - 5.0 g/dL   AST 29 15 - 41 U/L   ALT 24 0 - 44 U/L   Alkaline Phosphatase 119 38 - 126 U/L   Total Bilirubin 1.0 0.3 - 1.2 mg/dL   GFR calc non Af Amer >60 >60 mL/min   GFR calc Af Amer >60 >60 mL/min   Anion gap 11 5 - 15    Comment: Performed at [facility], [address] [place], [place] [address]  CBC WITH DIFFERENTIAL     Status: Abnormal   Collection Time: 02/21/19  8:15 AM  Result Value Ref Range   WBC 14.3 (H) 4.0 - 10.5 K/uL   RBC 3.93 (L) 4.22 - 5.81 MIL/uL   Hemoglobin 12.0 (L) 13.0 - 17.0 g/dL   HCT 84.6 (L) 96.2 - 95.2 %   MCV 98.7 80.0 - 100.0 fL   MCH 30.5 26.0 -  34.0 pg   MCHC 30.9 30.0 - 36.0 g/dL   RDW 84.1 (H) 32.4 - 40.1 %   Platelets 241 150 - 400 K/uL   nRBC 0.0 0.0 - 0.2 %   Neutrophils Relative % 61 %   Neutro Abs 8.8 (H) 1.7 - 7.7 K/uL   Lymphocytes Relative 26 %   Lymphs Abs 3.6 0.7 - 4.0 K/uL  Monocytes Relative 7 %   Monocytes Absolute 1.0 0.1 - 1.0 K/uL   Eosinophils Relative 4 %   Eosinophils Absolute 0.6 (H) 0.0 - 0.5 K/uL   Basophils Relative 1 %   Basophils Absolute 0.1 0.0 - 0.1 K/uL   Immature Granulocytes 1 %   Abs Immature Granulocytes 0.09 (H) 0.00 - 0.07 K/uL    Comment: Performed at [facility], [address] [place], [place] [address]  Procalcitonin     Status: None   Collection Time: 02/21/19  8:15 AM  Result Value Ref Range   Procalcitonin <0.10 ng/mL    Comment:        Interpretation: PCT (Procalcitonin) <= 0.5 ng/mL: Systemic infection (sepsis) is not likely. Local bacterial infection is possible. (NOTE)       Sepsis PCT Algorithm           Lower Respiratory Tract                                      Infection PCT Algorithm    ----------------------------     ----------------------------         PCT < 0.25 ng/mL                PCT < 0.10 ng/mL         Strongly encourage             Strongly discourage   discontinuation of antibiotics    initiation of antibiotics    ----------------------------     -----------------------------       PCT 0.25 - 0.50 ng/mL            PCT 0.10 - 0.25 ng/mL               OR       >80% decrease in PCT            Discourage initiation of                                            antibiotics      Encourage discontinuation           of antibiotics    ----------------------------     -----------------------------         PCT >= 0.50 ng/mL              PCT 0.26 - 0.50 ng/mL               AND        <80% decrease in PCT             Encourage initiation of                                             antibiotics       Encourage continuation           of  antibiotics    ----------------------------     -----------------------------        PCT >= 0.50 ng/mL                  PCT > 0.50 ng/mL  AND         increase in PCT                  Strongly encourage                                      initiation of antibiotics    Strongly encourage escalation           of antibiotics                                     -----------------------------                                           PCT <= 0.25 ng/mL                                                 OR                                        > 80% decrease in PCT                                     Discontinue / Do not initiate                                             antibiotics Performed at [facility], [address] [place], [place] [address]   SARS Coronavirus 2 (CEPHEID- Performed in [facility] hospital lab), Hosp Order     Status: None   Collection Time: 02/21/19  8:15 AM   Specimen: Nasopharyngeal Swab  Result Value Ref Range   SARS Coronavirus 2 NEGATIVE NEGATIVE    Comment: (NOTE) If result is NEGATIVE SARS-CoV-2 target nucleic acids are NOT DETECTED. The SARS-CoV-2 RNA is generally detectable in upper and lower  respiratory specimens during the acute phase of infection. The lowest  concentration of SARS-CoV-2 viral copies this assay can detect is 250  copies / mL. A negative result does not preclude SARS-CoV-2 infection  and should not be used as the sole basis for treatment or other  patient management decisions.  A negative result may occur with  improper specimen collection / handling, submission of specimen other  than nasopharyngeal swab, presence of viral mutation(s) within the  areas targeted by this assay, and inadequate number of viral copies  (<250 copies / mL). A negative result must be combined with clinical  observations, patient history, and epidemiological information. If result is POSITIVE SARS-CoV-2 target nucleic acids are  DETECTED. The SARS-CoV-2 RNA is generally detectable in upper and lower  respiratory specimens dur ing the acute phase of infection.  Positive  results are indicative of active infection with SARS-CoV-2.  Clinical  correlation with patient history and other diagnostic information is  necessary to determine patient infection status.  Positive results do  not rule out bacterial infection or co-infection with other viruses. If result is PRESUMPTIVE POSTIVE SARS-CoV-2 nucleic acids MAY BE PRESENT.   A presumptive positive result was obtained on the submitted specimen  and confirmed on repeat testing.  While 2019 novel coronavirus  (SARS-CoV-2) nucleic acids may be present in the submitted sample  additional confirmatory testing may be necessary for epidemiological  and / or clinical management purposes  to differentiate between  SARS-CoV-2 and other Sarbecovirus currently known to infect humans.  If clinically indicated additional testing with an alternate test  methodology [phone]([id number]) is advised. The SARS-CoV-2 RNA is generally  detectable in upper and lower respiratory sp ecimens during the acute  phase of infection. The expected result is Negative. Fact Sheet for Patients:  BoilerBrush.com.cyhttps://www.fda.gov/media/136312/download Fact Sheet for Healthcare Providers: https://pope.com/https://www.fda.gov/media/136313/download This test is not yet approved or cleared by the [place] FDA and has been authorized for detection and/or diagnosis of SARS-CoV-2 by FDA under an Emergency Use Authorization (EUA).  This EUA will remain in effect (meaning this test can be used) for the duration of the COVID-19 declaration under Section 564(b)(1) of the Act, 21 U.S.C. section 360bbb-3(b)(1), unless the authorization is terminated or revoked sooner. Performed at [facility], [address] [place], [place] [address]   Urinalysis, Routine w reflex microscopic     Status: Abnormal   Collection Time: 02/21/19 10:47  AM  Result Value Ref Range   Color, Urine AMBER (A) YELLOW    Comment: BIOCHEMICALS MAY BE AFFECTED BY COLOR   APPearance CLOUDY (A) CLEAR   Specific Gravity, Urine 1.021 1.005 - 1.030   pH 5.0 5.0 - 8.0   Glucose, UA NEGATIVE NEGATIVE mg/dL   Hgb urine dipstick NEGATIVE NEGATIVE   Bilirubin Urine NEGATIVE NEGATIVE   Ketones, ur NEGATIVE NEGATIVE mg/dL   Protein, ur 811100 (A) NEGATIVE mg/dL   Nitrite NEGATIVE NEGATIVE   Leukocytes,Ua NEGATIVE NEGATIVE   RBC / HPF 0-5 0 - 5 RBC/hpf   WBC, UA 0-5 0 - 5 WBC/hpf   Bacteria, UA RARE (A) NONE SEEN   Squamous Epithelial / LPF 0-5 0 - 5   Mucus PRESENT    Hyaline Casts, UA PRESENT     Comment: Performed at [facility], [address] [place], [place] [address]  Glucose, capillary     Status: Abnormal   Collection Time: 02/21/19 12:05 PM  Result Value Ref Range   Glucose-Capillary 118 (H) 70 - 99 mg/dL   Ct Angio Chest Pe W Or Wo Contrast  Result Date: 02/21/2019 CLINICAL DATA:  Shortness of breath and fever. EXAM: CT ANGIOGRAPHY CHEST WITH CONTRAST TECHNIQUE: Multidetector CT imaging of the chest was performed using the standard protocol during bolus administration of intravenous contrast. Multiplanar CT image reconstructions and MIPs were obtained to evaluate the vascular anatomy. CONTRAST:  75mL OMNIPAQUE IOHEXOL 350 MG/ML SOLN COMPARISON:  Chest CT 11/30/2018 FINDINGS: Cardiovascular: Mild stable cardiac enlargement but no pericardial effusion. The aorta is normal in caliber. Stable scattered atherosclerotic calcifications. Stable extensive three-vessel coronary artery calcifications. The pulmonary arterial tree is fairly well opacified. No definite filling defects to suggest pulmonary embolism. Mediastinum/Nodes: Stable mediastinal lymphadenopathy. The esophagus is grossly normal. Lungs/Pleura: There is also an asymmetric pattern of pulmonary edema in the lungs. No worrisome pulmonary lesions. Upper Abdomen: No significant upper  abdominal findings. There is a large gallstone noted in the gallbladder. Musculoskeletal: Again demonstrated is bilateral gynecomastia. There  is also mild diffuse body wall edema. The bony thorax is intact. No acute bony findings. Stable advanced degenerative changes involving the spine. Review of the MIP images confirms the above findings. IMPRESSION: 1. No CT findings for pulmonary embolism. 2. Normal caliber thoracic aorta. 3. Chronic bilateral pleural effusions with overlying atelectasis. 4. Asymmetric pattern of pulmonary edema. 5. Chronic mediastinal adenopathy. 6. Cholelithiasis. Aortic Atherosclerosis (ICD10-I70.0). Electronically Signed   By: Rudie MeyerP.  Gallerani M.D.   On: 02/21/2019 13:25   Dg Chest Portable 1 View  Result Date: 02/21/2019 CLINICAL DATA:  Fever, cough and shortness of breath. EXAM: PORTABLE CHEST 1 VIEW COMPARISON:  01/30/2019 FINDINGS: Stable cardiomegaly. Stable densities at the left lung base that could represent consolidation and/or pleural fluid. Hazy densities throughout both lungs are suggestive for mild edema. Evidence for small right pleural effusion. Atherosclerotic calcifications at the aortic arch. IMPRESSION: Chest radiograph findings are similar to the exam on 01/30/2019. Cardiomegaly with bilateral pleural effusions and probable pulmonary edema. Opacification at the left lung base could represent pleural fluid and/or consolidation. Electronically Signed   By: Richarda OverlieAdam  Henn M.D.   On: 02/21/2019 09:50    Pending Labs Unresulted Labs (From admission, onward)    Start     Ordered   02/28/19 0500  Creatinine, serum  (enoxaparin (LOVENOX)    CrCl >/= 30 ml/min)  Weekly,   STAT    Comments: while on enoxaparin therapy    02/21/19 1125   02/22/19 0500  Basic metabolic panel  Tomorrow morning,   STAT     02/21/19 1125   02/22/19 0500  CBC  Tomorrow morning,   STAT     02/21/19 1125   02/21/19 1127  MRSA PCR Screening  Add-on,   AD     02/21/19 1126   02/21/19 0838  Blood  Culture (routine x 2)  BLOOD CULTURE X 2,   STAT     02/21/19 0838   02/21/19 0838  Urine culture  ONCE - STAT,   STAT     02/21/19 0838          Vitals/Pain Today's Vitals   02/21/19 1100 02/21/19 1130 02/21/19 1200 02/21/19 1230  BP: (!) 151/87 (!) 145/45 (!) 145/83 (!) 165/65  Pulse: 86 84 85 88  Resp: (!) 28 (!) 33 (!) 33 (!) 31  Temp:      TempSrc:      SpO2: 99% 99% 99% 100%  Weight:      Height:      PainSc:        Isolation Precautions No active isolations  Medications Medications  enoxaparin (LOVENOX) injection 40 mg (40 mg Subcutaneous Given 02/21/19 1155)  acetaminophen (TYLENOL) tablet 650 mg (has no administration in time range)    Or  acetaminophen (TYLENOL) suppository 650 mg (has no administration in time range)  ondansetron (ZOFRAN) tablet 4 mg (has no administration in time range)    Or  ondansetron (ZOFRAN) injection 4 mg (has no administration in time range)  methylPREDNISolone sodium succinate (SOLU-MEDROL) 40 mg/mL injection 40 mg (40 mg Intravenous Given 02/21/19 1151)  ipratropium-albuterol (DUONEB) 0.5-2.5 (3) MG/3ML nebulizer solution 3 mL (has no administration in time range)  budesonide (PULMICORT) nebulizer solution 0.5 mg (0.5 mg Nebulization Given 02/21/19 1221)  aspirin EC tablet 81 mg (has no administration in time range)  atorvastatin (LIPITOR) tablet 80 mg (has no administration in time range)  carvedilol (COREG) tablet 6.25 mg (has no administration in time range)  isosorbide mononitrate (IMDUR) 24  hr tablet 30 mg (has no administration in time range)  nitroGLYCERIN (NITROSTAT) SL tablet 0.4 mg (has no administration in time range)  sacubitril-valsartan (ENTRESTO) 24-26 mg per tablet (has no administration in time range)  spironolactone (ALDACTONE) tablet 12.5 mg (has no administration in time range)  QUEtiapine (SEROQUEL) tablet 50 mg (has no administration in time range)  traZODone (DESYREL) tablet 50 mg (has no administration in time  range)  alum & mag hydroxide-simeth (MAALOX/MYLANTA) 200-200-20 MG/5ML suspension 30 mL (has no administration in time range)  magnesium hydroxide (MILK OF MAGNESIA) suspension 30 mL (has no administration in time range)  pantoprazole (PROTONIX) EC tablet 40 mg (has no administration in time range)  polyethylene glycol (MIRALAX / GLYCOLAX) packet 17 g (has no administration in time range)  senna-docusate (Senokot-S) tablet 2 tablet (has no administration in time range)  folic acid (FOLVITE) tablet 0.5 mg (has no administration in time range)  gabapentin (NEURONTIN) capsule 300 mg (has no administration in time range)  primidone (MYSOLINE) tablet 150 mg (has no administration in time range)  vitamin C (ASCORBIC ACID) tablet 500 mg (has no administration in time range)  multivitamin with minerals tablet 1 tablet (has no administration in time range)  potassium chloride SA (K-DUR) CR tablet 20 mEq (has no administration in time range)  cholecalciferol (VITAMIN D3) tablet 1,000 Units (has no administration in time range)  loratadine (CLARITIN) tablet 10 mg (has no administration in time range)  ketotifen (ZADITOR) 0.025 % ophthalmic solution 1 drop (has no administration in time range)  furosemide (LASIX) injection 40 mg (has no administration in time range)  insulin aspart (novoLOG) injection 0-9 Units (0 Units Subcutaneous Not Given 02/21/19 1405)  insulin aspart (novoLOG) injection 0-5 Units (has no administration in time range)  ceFEPIme (MAXIPIME) 2 g in sodium chloride 0.9 % 100 mL IVPB (has no administration in time range)  vancomycin (VANCOCIN) 1,500 mg in sodium chloride 0.9 % 500 mL IVPB (has no administration in time range)  ceFEPIme (MAXIPIME) 2 g in sodium chloride 0.9 % 100 mL IVPB ( Intravenous Stopped 02/21/19 1013)  metroNIDAZOLE (FLAGYL) IVPB 500 mg (0 mg Intravenous Stopped 02/21/19 1341)  vancomycin (VANCOCIN) IVPB 1000 mg/200 mL premix (0 mg Intravenous Stopped 02/21/19 1147)   sodium chloride 0.9 % bolus 1,000 mL (0 mLs Intravenous Stopped 02/21/19 1341)  furosemide (LASIX) injection 60 mg (60 mg Intravenous Given 02/21/19 1214)  iohexol (OMNIPAQUE) 350 MG/ML injection 75 mL (75 mLs Intravenous Contrast Given 02/21/19 1259)    Mobility non-ambulatory Low fall risk   Focused Assessments Cardiac Assessment Handoff:    Lab Results  Component Value Date   TROPONINI 0.08 (HH) 01/31/2019   No results found for: DDIMER Does the Patient currently have chest pain? No     R Recommendations: See Admitting Provider Note  Report given to:   Additional Notes:

## 2019-02-21 NOTE — Progress Notes (Signed)
Patient ID: Dylan [name], male   DOB: Feb 15, 1936, 83 y.o.   MRN: [id number]  ACP note  Spoke with patient at the bedside and wife on the phone secondary to the hospital no visitor policy.  Patient coming in with shortness of breath and trouble breathing worse since yesterday.  No cough but does have a fever and did have sweating.  COVID-19 testing negative.  CODE STATUS discussed.  Diagnosis: Clinical sepsis we will start aggressive antibiotics, for his acute on chronic systolic congestive heart failure we will give IV Lasix and continue his usual medications.  We will get a CT scan of the chest to rule out pulmonary embolism because he is nonambulatory.  For his chronic respiratory failure continue his oxygen.  Add steroids and nebulizer treatments for COPD exacerbation.  Patient's overall prognosis is poor secondary to being nonambulatory and multiple medical issues including chronic systolic congestive heart failure and end-stage COPD on oxygen.  Time spent on ACP discussion 17 minutes Dr Loletha Grayer

## 2019-02-21 NOTE — ED Notes (Signed)
Called pharmacy to verify home medications

## 2019-02-21 NOTE — ED Notes (Signed)
Patient transported to CT 

## 2019-02-21 NOTE — ED Provider Notes (Addendum)
[facility] Emergency Department Provider Note  Time seen: 8:32 AM  I have reviewed the triage vital signs and the nursing notes.   HISTORY  Chief Complaint Shortness of Breath and Fever    HPI Dylan [name] is a 83 y.o. male with a past medical history of CAD, CHF, COPD, hyperlipidemia, hypertension, MI, presents to the emergency department for shortness of breath fever and diaphoresis.  According to the patient and EMS report patient is from home, admits increased shortness of breath overnight last night with subjective fever this morning.  Patient normally on 2 L of oxygen currently on 3 L, tachypneic around 30 breaths/min, significant diaphoresis.  Patient denies any chest pain.  Denies abdominal pain vomiting or diarrhea.  Denies dysuria.  Completed a course of antibiotics earlier this month for urinary tract infection.  Past Medical History:  Diagnosis Date  . CAD (coronary artery disease)   . Cervicalgia   . CHF (congestive heart failure) (HCC)   . COPD (chronic obstructive pulmonary disease) (HCC)   . Diastolic heart failure (HCC)   . Foot drop, right   . History of kidney stones   . Hyperlipidemia    unspecified  . Hypertension   . Myocardial infarction (HCC)   . Osteoarthritis   . Shoulder pain, left   . Sleep apnea   . Tremor, essential     Patient Active Problem List   Diagnosis Date Noted  . Acute respiratory failure with hypoxemia (HCC) 11/23/2018  . Goals of care, counseling/discussion   . Palliative care by specialist   . DNR (do not resuscitate) discussion   . Acute CHF (congestive heart failure) (HCC) 11/14/2018  . Acute on chronic systolic CHF (congestive heart failure) (HCC) 10/27/2018  . HCAP (healthcare-associated pneumonia) 10/27/2018  . Hypokalemia 10/01/2018  . Hypertensive heart and kidney disease with acute on chronic diastolic congestive heart failure and stage 3 chronic kidney disease (HCC) 09/09/2018  . Chronic  constipation 09/09/2018  . BPH with obstruction/lower urinary tract symptoms 09/09/2018  . Obstructive sleep apnea of adult 09/09/2018  . Idiopathic peripheral neuropathy 09/09/2018  . Chronic kidney disease, stage 3, mod decreased GFR (HCC) 09/09/2018  . Anemia of chronic renal failure, stage 3 (moderate) (HCC) 09/09/2018  . Periprosthetic fracture around internal prosthetic left knee joint 08/26/2018  . Acute ST elevation myocardial infarction (STEMI) of inferior wall (HCC) 06/21/2018  . S/P total knee arthroplasty 06/01/2018  . Primary osteoarthritis of left knee 02/05/2018  . Sepsis (HCC) 01/10/2018  . Community acquired pneumonia 12/01/2017  . Pneumonia 12/01/2017  . Hyperlipidemia 09/23/2017  . Weakness 08/31/2017  . Benign essential tremor 10/18/2016  . Class 3 severe obesity due to excess calories with serious comorbidity and body mass index (BMI) of 45.0 to 49.9 in adult (HCC) 10/18/2016  . Varicose veins of lower extremities with ulcer (HCC) 06/17/2016  . Chronic venous insufficiency 06/17/2016  . Lymphedema 06/17/2016  . Acute on chronic diastolic heart failure (HCC) 06/10/2016  . COPD, severe (HCC) 05/24/2016  . AV block, Mobitz 1 02/09/2016  . Coronary atherosclerosis of native coronary artery 02/09/2016  . ST elevation MI (STEMI) (HCC) 02/08/2016  . Right foot drop 04/07/2014  . Rotator cuff rupture, complete 04/22/2013  . Neck pain 04/14/2013    Past Surgical History:  Procedure Laterality Date  . APPLICATION OF WOUND VAC Right 11/13/2017   Procedure: APPLICATION OF WOUND VAC;  Surgeon: Annice Needyew, Jason S, MD;  Location: [facility] ORS;  Service: Vascular;  Laterality: Right;  .  BACK SURGERY  06/2010  . CARDIAC CATHETERIZATION Left 04/30/2016   Procedure: Left Heart Cath and Coronary Angiography;  Surgeon: Laurier NancyShaukat A Khan, MD;  Location: [facility] INVASIVE CV LAB;  Service: Cardiovascular;  Laterality: Left;  . COLONOSCOPY    . CORONARY ANGIOPLASTY    . CORONARY/GRAFT ACUTE MI  REVASCULARIZATION N/A 06/21/2018   Procedure: Coronary/Graft Acute MI Revascularization;  Surgeon: Iran OuchArida, Muhammad A, MD;  Location: [facility] INVASIVE CV LAB;  Service: Cardiovascular;  Laterality: N/A;  . KNEE ARTHROPLASTY Left 06/01/2018   Procedure: COMPUTER ASSISTED TOTAL KNEE ARTHROPLASTY;  Surgeon: Donato HeinzHooten, James P, MD;  Location: [facility] ORS;  Service: Orthopedics;  Laterality: Left;  . KNEE ARTHROSCOPY Left   . KNEE SURGERY Left 1998  . LEFT HEART CATH AND CORONARY ANGIOGRAPHY N/A 06/21/2018   Procedure: LEFT HEART CATH AND CORONARY ANGIOGRAPHY;  Surgeon: Iran OuchArida, Muhammad A, MD;  Location: [facility] INVASIVE CV LAB;  Service: Cardiovascular;  Laterality: N/A;  . ORIF FEMUR FRACTURE Left 08/27/2018   Procedure: OPEN REDUCTION INTERNAL FIXATION (ORIF) SUPRACONDYLAR FEMUR FRACTURE ABOVE PROSTHESIS, QUADRICEPS REPAIR;  Surgeon: Kennedy BuckerMenz, Michael, MD;  Location: [facility] ORS;  Service: Orthopedics;  Laterality: Left;  . TEE WITHOUT CARDIOVERSION N/A 11/18/2018   Procedure: TRANSESOPHAGEAL ECHOCARDIOGRAM (TEE);  Surgeon: Lamar BlinksKowalski, Bruce J, MD;  Location: [facility] ORS;  Service: Cardiovascular;  Laterality: N/A;  . TONSILLECTOMY    . WOUND DEBRIDEMENT Right 11/13/2017   Procedure: DEBRIDEMENT WOUND;  Surgeon: Annice Needyew, Jason S, MD;  Location: [facility] ORS;  Service: Vascular;  Laterality: Right;    Prior to Admission medications   Medication Sig Start Date End Date Taking? Authorizing Provider  acetaminophen (TYLENOL) 500 MG tablet Take 1,000 mg by mouth 2 (two) times daily as needed for moderate pain.    [provider]  alum & mag hydroxide-simeth (MAALOX PLUS) 400-400-40 MG/5ML suspension Take 30 mLs by mouth every 4 (four) hours as needed for indigestion. 09/04/18   [provider]  amoxicillin-clavulanate (AUGMENTIN) 875-125 MG tablet Take 1 tablet by mouth every 12 (twelve) hours for 20 days. Patient not taking: Reported on 02/17/2019 02/03/19 02/23/19  Enid BaasKalisetti, Radhika, MD  Ascorbic Acid (VITAMIN C PO) Take  500 mg by mouth daily.     [provider]  aspirin EC 81 MG tablet Take 81 mg by mouth daily.    [provider]  atorvastatin (LIPITOR) 80 MG tablet Take 80 mg by mouth daily.  09/04/18   [provider]  carvedilol (COREG) 6.25 MG tablet Take 6.25 mg by mouth 2 (two) times daily with a meal. 09/07/18   [provider]  Cholecalciferol (VITAMIN D-1000 MAX ST) 25 MCG (1000 UT) tablet Take 1 tablet (1,000 Units total) by mouth daily. 12/24/18   Delma FreezeHackney, Tina A, FNP  folic acid (V-R FOLIC ACID) 400 MCG tablet Take 1 tablet (400 mcg total) by mouth daily. 12/24/18   Delma FreezeHackney, Tina A, FNP  furosemide (LASIX) 40 MG tablet Take 40 mg by mouth 2 (two) times daily.    [provider]  gabapentin (NEURONTIN) 300 MG capsule Take 1 capsule (300 mg total) by mouth at bedtime. 12/24/18   Clarisa KindredHackney, Tina A, FNP  ipratropium-albuterol (DUONEB) 0.5-2.5 (3) MG/3ML SOLN Take 3 mLs by nebulization every 6 (six) hours as needed (shortness of breath).     [provider]  isosorbide mononitrate (IMDUR) 30 MG 24 hr tablet Take 30 mg by mouth daily.  05/31/16   [provider]  ketotifen (ZADITOR) 0.025 % ophthalmic solution Place 1 drop into  both eyes as needed. 12/24/18   Delma FreezeHackney, Tina A, FNP  loratadine (CLARITIN) 10 MG tablet Take 1 tablet (10 mg total) by mouth as needed for allergies. 12/24/18   Delma FreezeHackney, Tina A, FNP  magnesium hydroxide (MILK OF MAGNESIA) 400 MG/5ML suspension Take 30 mLs by mouth daily as needed for mild constipation.     [provider]  Multiple Vitamins-Minerals (CENTROVITE) TABS Take 1 tablet by mouth daily.    [provider]  nitroGLYCERIN (NITROSTAT) 0.3 MG SL tablet Place 0.3 mg under the tongue every 5 (five) minutes as needed for chest pain.    [provider]  pantoprazole (PROTONIX) 40 MG tablet Take 1 tablet (40 mg total) by mouth daily. 12/01/18   [name], Evelena AsaMontell D, MD  polyethylene glycol (MIRALAX / GLYCOLAX)  packet Take 17 g by mouth 2 (two) times daily as needed. Hold for loose stools. 09/29/18   [provider]  potassium chloride SA (K-DUR) 20 MEQ tablet Take 1 tablet (20 mEq total) by mouth daily. 12/24/18   Delma FreezeHackney, Tina A, FNP  predniSONE (DELTASONE) 20 MG tablet Take 1 tablet (20 mg total) by mouth daily. 1 po daily 12/24/18   Clarisa KindredHackney, Tina A, FNP  primidone (MYSOLINE) 50 MG tablet Take 200 mg by mouth 2 (two) times daily.     [provider]  QUEtiapine (SEROQUEL) 50 MG tablet Take 1 tablet (50 mg total) by mouth at bedtime as needed. 12/24/18   Delma FreezeHackney, Tina A, FNP  sennosides-docusate sodium (SENOKOT-S) 8.6-50 MG tablet Take 2 tablets by mouth daily as needed for constipation. 12/24/18   Delma FreezeHackney, Tina A, FNP  spironolactone (ALDACTONE) 25 MG tablet Take 12.5 mg by mouth daily.  09/04/18   [provider]  tamsulosin (FLOMAX) 0.4 MG CAPS capsule Take 1 capsule (0.4 mg total) by mouth daily. 09/01/18   Evon SlackGaines, Thomas C, PA-C  traZODone (DESYREL) 50 MG tablet Take 50 mg by mouth at bedtime. 01/07/19 01/07/20  [provider]  umeclidinium-vilanterol (ANORO ELLIPTA) 62.5-25 MCG/INH AEPB Inhale 1 puff into the lungs as needed. 12/24/18   Delma FreezeHackney, Tina A, FNP    No Known Allergies  Family History  Problem Relation Age of Onset  . Diabetes Son   . Cancer Mother   . Colon cancer Mother   . Lung cancer Mother   . Tremor Father   . Heart disease Father   . Tremor Brother   . Bladder Cancer Brother   . Tremor Sister     Social History Social History   Tobacco Use  . Smoking status: Former Smoker    Packs/day: 0.25    Years: 58.00    Pack years: 14.50    Types: Cigarettes  . Smokeless tobacco: Former NeurosurgeonUser  . Tobacco comment: quit the begginning of septembet 2019  Substance Use Topics  . Alcohol use: No    Alcohol/week: 0.0 standard drinks  . Drug use: No    Review of Systems Constitutional: Subjective fever at home ENT: Negative for  ingestion Cardiovascular: Negative for chest pain. Respiratory: As if her shortness of breath.  Positive for cough. Gastrointestinal: Negative for abdominal pain, vomiting Musculoskeletal: Negative for musculoskeletal complaints Skin: Significant diaphoresis Neurological: Negative for headache All other ROS negative  ____________________________________________   PHYSICAL EXAM:  VITAL SIGNS: ED Triage Vitals  Enc Vitals Group     BP 02/21/19 0809 (!) 156/104     Pulse Rate 02/21/19 0809 (!) 106     Resp 02/21/19 0809 (!) 29  Temp 02/21/19 0809 99.1 F (37.3 C)     Temp Source 02/21/19 0809 Oral     SpO2 02/21/19 0809 99 %     Weight 02/21/19 0810 246 lb (111.6 kg)     Height 02/21/19 0810  (1.778 m)     Head Circumference --      Peak Flow --      Pain Score 02/21/19 0810 0     Pain Loc --      Pain Edu? --      Excl. in GC? --     Constitutional: Alert and oriented.  Moderate distress due to respiratory effort moderate diaphoresis Eyes: Normal exam ENT      Head: Normocephalic and atraumatic.      Mouth/Throat: Mucous membranes are moist. Cardiovascular: Normal rate, regular rhythm around 100 bpm. Respiratory: Increased respiratory effort tachypnea around 30 breaths/min.  No obvious wheeze rales or rhonchi on exam. Gastrointestinal: Soft and nontender. No distention.  Obese Musculoskeletal: Nontender with normal range of motion in all extremities.  Neurologic:  Normal speech and language. No gross focal neurologic deficits  Skin:  Skin is warm, significant diaphoresis. Psychiatric: Mood and affect are normal.   ____________________________________________    EKG  EKG viewed and interpreted by myself shows a sinus tachycardia 108 bpm with a widened QRS, left axis deviation, prolonged PR and QTC, nonspecific ST changes  ____________________________________________    RADIOLOGY  Chest x-ray shows cardiomegaly with bilateral effusions and pulmonary  edema.  ____________________________________________   INITIAL IMPRESSION / ASSESSMENT AND PLAN / ED COURSE  Pertinent labs & imaging results that were available during my care of the patient were reviewed by me and considered in my medical decision making (see chart for details).   Patient presents to the emergency department for shortness of breath cough subjective fever at home since yesterday.  Patient is tachypneic, tachycardic with moderate diaphoresis, meeting sepsis criteria.  We will start broad-spectrum antibiotics, obtain labs, cultures as well as a coronavirus screen.  We will obtain a chest x-ray, urinalysis and continue to closely monitor.  Patient agreeable with plan of care.  Chest x-ray shows likely pulmonary edema with bilateral pleural effusions however largely unchanged from 01/30/2019.  Patient is COVID test is negative.  Patient's lab work is otherwise largely at baseline, besides a leukocytosis of 14,000.  Urinalysis is negative.  Given the patient's shortness of breath fever at home, leukocytosis and initial respiratory distress patient will be admitted to the hospital service for further treatment and continued IV antibiotics.  Given the chest x-ray findings we will also dose IV Lasix.  Patient will be admitted to the hospital service.  Dylan [name] was evaluated in Emergency Department on 02/21/2019 for the symptoms described in the history of present illness. He was evaluated in the context of the global COVID-19 pandemic, which necessitated consideration that the patient might be at risk for infection with the SARS-CoV-2 virus that causes COVID-19. Institutional protocols and algorithms that pertain to the evaluation of patients at risk for COVID-19 are in a state of rapid change based on information released by regulatory bodies including the CDC and federal and state organizations. These policies and algorithms were followed during the patient's care in the ED.  CRITICAL  CARE Performed by: Minna Antis   Total critical care time: 30 minutes  Critical care time was exclusive of separately billable procedures and treating other patients.  Critical care was necessary to treat or prevent imminent or life-threatening  deterioration.  Critical care was time spent personally by me on the following activities: development of treatment plan with patient and/or surrogate as well as nursing, discussions with consultants, evaluation of patient's response to treatment, examination of patient, obtaining history from patient or surrogate, ordering and performing treatments and interventions, ordering and review of laboratory studies, ordering and review of radiographic studies, pulse oximetry and re-evaluation of patient's condition.   ____________________________________________   FINAL CLINICAL IMPRESSION(S) / ED DIAGNOSES  Sepsis Dyspnea Fever   Harvest Dark, MD 02/21/19 1119    Harvest Dark, MD 02/21/19 1121

## 2019-02-22 LAB — CBC
HCT: 31.7 % — ABNORMAL LOW (ref 39.0–52.0)
Hemoglobin: 9.7 g/dL — ABNORMAL LOW (ref 13.0–17.0)
MCH: 30.4 pg (ref 26.0–34.0)
MCHC: 30.6 g/dL (ref 30.0–36.0)
MCV: 99.4 fL (ref 80.0–100.0)
Platelets: 175 10*3/uL (ref 150–400)
RBC: 3.19 MIL/uL — ABNORMAL LOW (ref 4.22–5.81)
RDW: 17.2 % — ABNORMAL HIGH (ref 11.5–15.5)
WBC: 14 10*3/uL — ABNORMAL HIGH (ref 4.0–10.5)
nRBC: 0 % (ref 0.0–0.2)

## 2019-02-22 LAB — GLUCOSE, CAPILLARY
Glucose-Capillary: 112 mg/dL — ABNORMAL HIGH (ref 70–99)
Glucose-Capillary: 107 mg/dL — ABNORMAL HIGH (ref 70–99)
Glucose-Capillary: 122 mg/dL — ABNORMAL HIGH (ref 70–99)
Glucose-Capillary: 133 mg/dL — ABNORMAL HIGH (ref 70–99)

## 2019-02-22 LAB — BASIC METABOLIC PANEL
Anion gap: 9 (ref 5–15)
BUN: 36 mg/dL — ABNORMAL HIGH (ref 8–23)
CO2: 28 mmol/L (ref 22–32)
Calcium: 8.4 mg/dL — ABNORMAL LOW (ref 8.9–10.3)
Chloride: 103 mmol/L (ref 98–111)
Creatinine, Ser: 1.16 mg/dL (ref 0.61–1.24)
GFR calc Af Amer: >60 mL/min (ref >60)
GFR calc non Af Amer: 59 mL/min — ABNORMAL LOW (ref >60)
Glucose, Bld: 123 mg/dL — ABNORMAL HIGH (ref 70–99)
Potassium: 4.4 mmol/L (ref 3.5–5.1)
Sodium: 140 mmol/L (ref 135–145)

## 2019-02-22 LAB — URINE CULTURE

## 2019-02-22 MED ORDER — SODIUM CHLORIDE 0.9 % IV SOLN
INTRAVENOUS | Status: DC | PRN
  Administered 2019-02-22 – 2019-02-23 (×2): 500 mL via INTRAVENOUS
  Administered 2019-02-23: 20 mL via INTRAVENOUS
  Administered 2019-02-23: 15:00:00 500 mL via INTRAVENOUS

## 2019-02-22 MED ORDER — ENOXAPARIN SODIUM 40 MG/0.4ML ~~LOC~~ SOLN
40.0000 mg | Freq: Two times a day (BID) | SUBCUTANEOUS | Status: DC
  Administered 2019-02-22 – 2019-02-24 (×4): 40 mg via SUBCUTANEOUS
  Filled 2019-02-22 (×4): qty 0.4

## 2019-02-22 NOTE — Progress Notes (Signed)
Anticoagulation monitoring(Lovenox):  83yo  male ordered Lovenox 40 mg Q24h for DVT prevention  Filed Weights   02/21/19 0810 02/21/19 1729  Weight: 246 lb (111.6 kg) 286 lb 9.6 oz (130 kg)   BMI 41.12   Lab Results  Component Value Date   CREATININE 1.16 02/22/2019   CREATININE 1.09 02/21/2019   CREATININE 0.93 02/03/2019   Estimated Creatinine Clearance: 67.7 mL/min (by C-G formula based on SCr of 1.16 mg/dL). Hemoglobin & Hematocrit     Component Value Date/Time   HGB 9.7 (L) 02/22/2019 0439   HGB 13.0 11/30/2012 1708   HCT 31.7 (L) 02/22/2019 0439   HCT 38.0 (L) 11/30/2012 1708     Per Protocol for Patient with estCrcl > 30 ml/min and BMI > 40, will transition to Lovenox 40 mg Q12h.     Paulina Fusi, PharmD, BCPS 02/22/2019 10:58 AM

## 2019-02-22 NOTE — Progress Notes (Signed)
Pt BP elevated on assessment, 141/104. Seals notified new orders given.   Repositioned pt nurse used calming techniques and pt BP lowered to 125/58 without medication. Seals Updated.

## 2019-02-22 NOTE — Progress Notes (Signed)
[employer] - [place] [facility]   [facility]    MR#:  [id number]  DATE OF BIRTH:  [birth year]  SUBJECTIVE:  CHIEF COMPLAINT:   Chief Complaint  Patient presents with  . Shortness of Breath  . Fever   Admitted for pneumonia and CHF.  On oxygen.  Denies any new complaints. REVIEW OF SYSTEMS:  CONSTITUTIONAL: No fever, fatigue or weakness.  EYES: No blurred or double vision.  EARS, NOSE, AND THROAT: No tinnitus or ear pain.  RESPIRATORY: Have cough, shortness of breath, wheezing, no hemoptysis.  CARDIOVASCULAR: No chest pain, orthopnea, edema.  GASTROINTESTINAL: No nausea, vomiting, diarrhea or abdominal pain.  GENITOURINARY: No dysuria, hematuria.  ENDOCRINE: No polyuria, nocturia,  HEMATOLOGY: No anemia, easy bruising or bleeding SKIN: No rash or lesion. MUSCULOSKELETAL: No joint pain or arthritis.   NEUROLOGIC: No tingling, numbness, weakness.  PSYCHIATRY: No anxiety or depression.   ROS  DRUG ALLERGIES:  No Known Allergies  VITALS:  Blood pressure 131/69, pulse 69, temperature 98.3 F (36.8 C), temperature source Oral, resp. rate 19, height 5\' 10"  (1.778 m), weight 130 kg, SpO2 95 %.  PHYSICAL EXAMINATION:  GENERAL:  83 y.o.-year-old patient lying in the bed with no acute distress.  EYES: Pupils equal, round, reactive to light and accommodation. No scleral icterus. Extraocular muscles intact.  HEENT: Head atraumatic, normocephalic. Oropharynx and nasopharynx clear.  NECK:  Supple, no jugular venous distention. No thyroid enlargement, no tenderness.  LUNGS: Normal breath sounds bilaterally, no wheezing, some crepitation. No use of accessory muscles of respiration.  CARDIOVASCULAR: S1, S2 normal. No murmurs, rubs, or gallops.  ABDOMEN: Soft, nontender, nondistended. Bowel sounds present. No organomegaly or mass.  EXTREMITIES: No pedal edema, cyanosis, or clubbing.  NEUROLOGIC: Cranial nerves II through XII are intact. Muscle strength  4/5 in both upper and left lower extremities 1/5 in the right lower extremity. Sensation intact. Gait not checked.  PSYCHIATRIC: The patient is alert and oriented x 3.  SKIN: No obvious rash, lesion, or ulcer.   Physical Exam LABORATORY PANEL:   CBC Recent Labs  Lab 02/22/19 0439  WBC 14.0*  HGB 9.7*  HCT 31.7*  PLT 175   ------------------------------------------------------------------------------------------------------------------  Chemistries  Recent Labs  Lab 02/21/19 0815 02/22/19 0439  NA 139 140  K 4.8 4.4  CL 101 103  CO2 27 28  GLUCOSE 155* 123*  BUN 29* 36*  CREATININE 1.09 1.16  CALCIUM 9.1 8.4*  AST 29  --   ALT 24  --   ALKPHOS 119  --   BILITOT 1.0  --    ------------------------------------------------------------------------------------------------------------------  Cardiac Enzymes No results for input(s): TROPONINI in the last 168 hours. ------------------------------------------------------------------------------------------------------------------  RADIOLOGY:  Ct Angio Chest Pe W Or Wo Contrast  Result Date: 02/21/2019 CLINICAL DATA:  Shortness of breath and fever. EXAM: CT ANGIOGRAPHY CHEST WITH CONTRAST TECHNIQUE: Multidetector CT imaging of the chest was performed using the standard protocol during bolus administration of intravenous contrast. Multiplanar CT image reconstructions and MIPs were obtained to evaluate the vascular anatomy. CONTRAST:  75mL OMNIPAQUE IOHEXOL 350 MG/ML SOLN COMPARISON:  Chest CT 11/30/2018 FINDINGS: Cardiovascular: Mild stable cardiac enlargement but no pericardial effusion. The aorta is normal in caliber. Stable scattered atherosclerotic calcifications. Stable extensive three-vessel coronary artery calcifications. The pulmonary arterial tree is fairly well opacified. No definite filling defects to suggest pulmonary embolism. Mediastinum/Nodes: Stable mediastinal lymphadenopathy. The esophagus is grossly normal.  Lungs/Pleura: There is also an asymmetric pattern of pulmonary edema in the  lungs. No worrisome pulmonary lesions. Upper Abdomen: No significant upper abdominal findings. There is a large gallstone noted in the gallbladder. Musculoskeletal: Again demonstrated is bilateral gynecomastia. There is also mild diffuse body wall edema. The bony thorax is intact. No acute bony findings. Stable advanced degenerative changes involving the spine. Review of the MIP images confirms the above findings. IMPRESSION: 1. No CT findings for pulmonary embolism. 2. Normal caliber thoracic aorta. 3. Chronic bilateral pleural effusions with overlying atelectasis. 4. Asymmetric pattern of pulmonary edema. 5. Chronic mediastinal adenopathy. 6. Cholelithiasis. Aortic Atherosclerosis (ICD10-I70.0). Electronically Signed   By: Marijo Sanes M.D.   On: 02/21/2019 13:25   Dg Chest Portable 1 View  Result Date: 02/21/2019 CLINICAL DATA:  Fever, cough and shortness of breath. EXAM: PORTABLE CHEST 1 VIEW COMPARISON:  01/30/2019 FINDINGS: Stable cardiomegaly. Stable densities at the left lung base that could represent consolidation and/or pleural fluid. Hazy densities throughout both lungs are suggestive for mild edema. Evidence for small right pleural effusion. Atherosclerotic calcifications at the aortic arch. IMPRESSION: Chest radiograph findings are similar to the exam on 01/30/2019. Cardiomegaly with bilateral pleural effusions and probable pulmonary edema. Opacification at the left lung base could represent pleural fluid and/or consolidation. Electronically Signed   By: Markus Daft M.D.   On: 02/21/2019 09:50    ASSESSMENT AND PLAN:   Active Problems:   Sepsis ([place])  1.  Clinical sepsis with fever, leukocytosis, tachycardia  pneumonia left lower lobe.  Urine analysis negative.  Follow-up cultures.  Aggressive antibiotics with vancomycin and cefepime.  Send off an MRSA PCR.  First procalcitonin negative. 2.  Acute on chronic  systolic congestive heart failure.    continue IV Lasix.  Patient on spironolactone, Coreg and Entresto  3.  Nonambulatory.  CT scan of the chest to rule out pulmonary embolism-negative. 4.  Chronic respiratory failure.  Continue oxygen 5.  COPD exacerbation.  Start Solu-Medrol nebulizer treatments 6.  Obesity with a BMI of 35.3.  Weight loss needed 7.  Hyperlipidemia unspecified on atorvastatin 8.  GERD on PPI 9.  Stage II sacral decubitus    All the records are reviewed and case discussed with Care Management/Social Workerr. Management plans discussed with the patient, family and they are in agreement.  CODE STATUS: Full  TOTAL TIME TAKING CARE OF THIS PATIENT: 40 minutes.     POSSIBLE D/C IN 1-2 DAYS, DEPENDING ON CLINICAL CONDITION.   Vaughan Basta M.D on 02/22/2019   Between 7am to 6pm - Pager - [phone]  After 6pm go to [email or web address] - password [email or web address] [facility]  Office  [phone]  CC: Primary care physician; Albina Billet, MD  Note: This dictation was prepared with Dragon dictation along with smaller phrase technology. Any transcriptional errors that result from this process are unintentional.

## 2019-02-22 NOTE — Progress Notes (Signed)
Pharmacy Antibiotic Note  Dylan [name] is a 83 y.o. male admitted on 02/21/2019 with sepsis.  Pharmacy has been consulted for vancomycin and cefepime dosing. Possible left lower lobe PNA. MRSA PCR already ordered by MD. Vancomycin 1g and cefepime 2g given in the ED.   Plan: Will continue dosing with cefepime 2 g IV q8h.   Will continue Vancomycin 1500 mg IV Q 24 hrs  Goal AUC 400-550. Expected AUC: 538 SCr used: 1.09 Cmin expected: 12.5  Continue to monitor renal function. Ordered BMP for tomorrow.   Height: 5\' 10"  (177.8 cm) Weight: 286 lb 9.6 oz (130 kg) IBW/kg (Calculated) : 73  Temp (24hrs), Avg:98.6 F (37 C), Min:98.3 F (36.8 C), Max:98.8 F (37.1 C)  Recent Labs  Lab 02/21/19 0815 02/22/19 0439  WBC 14.3* 14.0*  CREATININE 1.09 1.16  LATICACIDVEN 1.5  --     Estimated Creatinine Clearance: 67.7 mL/min (by C-G formula based on SCr of 1.16 mg/dL).    No Known Allergies  Antimicrobials this admission: Cefepime 6/21 >> vanc 6/21 >>  Dose adjustments this admission:   Microbiology results: 6/21 BCx: pending 6/21 UCx: collected  MRSA PCR: ordered  Thank you for allowing pharmacy to be a part of this patient's care.  Paulina Fusi, PharmD, BCPS 02/22/2019 10:57 AM

## 2019-02-22 NOTE — Plan of Care (Signed)

## 2019-02-22 NOTE — TOC Initial Note (Addendum)
Transition of Care [facility](TOC) - Initial/Assessment Note    Patient Details  Name: Dylan [name] MRN: [id number] Date of Birth: 04-Aug-1936  Transition of Care [facility](TOC) CM/SW Contact:    Allayne ButcherJeanna M Keilon Ressel, RN Phone Number: 02/22/2019, 9:50 AM  Clinical Narrative:                 Patient admitted to the hospital for shortness of breath and fever.  Patient has been admitted 6 times in the past 6 months.  Patient is open with [facility], patient has Paramedicine visits, established with the heart failure clinic, and has a case Production designer, theatre/television/filmmanager with [facility].  Patient is on chronic O2 at 2L.  Patient has a wheelchair and a hoyer lift.  Mellissa Kohuteresa Cooper with [facility] aware of patient admission.  Patient is also being followed by OP palliative [facility].  Patient is appropriate for hospice services per OP Palliative note but wife and patient have refused hospice.  RNCM will follow for additional needs at discharge.   Expected Discharge Plan: Home w Home Health Services Barriers to Discharge: Continued Medical Work up   Patient Goals and CMS Choice        Expected Discharge Plan and Services Expected Discharge Plan: Home w Home Health Services   Discharge Planning Services: CM Consult   Living arrangements for the past 2 months: Single Family Home                             HH [facility] Date HH Agency Contacted: 02/22/19 Time HH Agency Contacted: [phone] [facility] Agency: Mellissa Kohuteresa Cooper  Prior Living Arrangements/Services Living arrangements for the past 2 months: Single Family Home Lives with:: Self Patient language and need for interpreter reviewed:: No Do you feel safe going back to the place where you live?: Yes      Need for Family Participation in Patient Care: Yes (Comment)(chronic medical condition) Care giver support system in place?: Yes (comment) Current home services: DME, Home PT, Home RN, Home  OT(Wheelchair, Oxygen, Open with [facility]) Criminal Activity/Legal Involvement Pertinent to Current Situation/Hospitalization: No - Comment as needed  Activities of Daily Living Home Assistive Devices/Equipment: Blood pressure cuff, Shower chair with back, Wheelchair, Environmental consultantWalker (specify type), Oxygen ADL Screening (condition at time of admission) Patient's cognitive ability adequate to safely complete daily activities?: Yes Is the patient deaf or have difficulty hearing?: Yes Does the patient have difficulty seeing, even when wearing glasses/contacts?: No Does the patient have difficulty concentrating, remembering, or making decisions?: No Patient able to express need for assistance with ADLs?: Yes Does the patient have difficulty dressing or bathing?: Yes Independently performs ADLs?: No Communication: Independent Dressing (OT): Needs assistance Is this a change from baseline?: Pre-admission baseline Grooming: Needs assistance Is this a change from baseline?: Pre-admission baseline Feeding: Independent Bathing: Needs assistance Is this a change from baseline?: Pre-admission baseline Toileting: Needs assistance Is this a change from baseline?: Pre-admission baseline In/Out Bed: Needs assistance Is this a change from baseline?: Pre-admission baseline Walks in Home: (Bed bound) Does the patient have difficulty walking or climbing stairs?: Yes Weakness of Legs: Both Weakness of Arms/Hands: Both  Permission Sought/Granted Permission sought to share information with : Case Manager, Other (comment)       Permission granted to share info w AGENCY: [facility]        Emotional Assessment Appearance:: Appears stated age  Alcohol / Substance Use: Not Applicable Psych Involvement: No (comment)  Admission diagnosis:  Acute pulmonary edema (HCC) [J81.0] Dyspnea, unspecified type [R06.00] Sepsis, due to unspecified organism, unspecified whether acute organ dysfunction present [facility])  [A41.9] Patient Active Problem List   Diagnosis Date Noted  . Acute respiratory failure with hypoxemia ([place]) 11/23/2018  . Goals of care, counseling/discussion   . Palliative care by specialist   . DNR (do not resuscitate) discussion   . Acute CHF (congestive heart failure) ([place]) 11/14/2018  . Acute on chronic systolic CHF (congestive heart failure) ([place]) 10/27/2018  . HCAP (healthcare-associated pneumonia) 10/27/2018  . Hypokalemia 10/01/2018  . Hypertensive heart and kidney disease with acute on chronic diastolic congestive heart failure and stage 3 chronic kidney disease ([place]) 09/09/2018  . Chronic constipation 09/09/2018  . BPH with obstruction/lower urinary tract symptoms 09/09/2018  . Obstructive sleep apnea of adult 09/09/2018  . Idiopathic peripheral neuropathy 09/09/2018  . Chronic kidney disease, stage 3, mod decreased GFR (HCC) 09/09/2018  . Anemia of chronic renal failure, stage 3 (moderate) ([address]) 09/09/2018  . Periprosthetic fracture around internal prosthetic left knee joint 08/26/2018  . Acute ST elevation myocardial infarction (STEMI) of inferior wall ([place]) 06/21/2018  . S/P total knee arthroplasty 06/01/2018  . Primary osteoarthritis of left knee 02/05/2018  . Sepsis ([place]) 01/10/2018  . Community acquired pneumonia 12/01/2017  . Pneumonia 12/01/2017  . Hyperlipidemia 09/23/2017  . Weakness 08/31/2017  . Benign essential tremor 10/18/2016  . Class 3 severe obesity due to excess calories with serious comorbidity and body mass index (BMI) of 45.0 to 49.9 in adult ([place]) 10/18/2016  . Varicose veins of lower extremities with ulcer ([place]) 06/17/2016  . Chronic venous insufficiency 06/17/2016  . Lymphedema 06/17/2016  . Acute on chronic diastolic heart failure ([place]) 06/10/2016  . COPD, severe ([place]) 05/24/2016  . AV block, [place] 1 02/09/2016  . Coronary atherosclerosis of native coronary artery 02/09/2016  . ST elevation MI (STEMI) ([place]) 02/08/2016  . Right foot drop  04/07/2014  . Rotator cuff rupture, complete 04/22/2013  . Neck pain 04/14/2013   PCP:  Albina Billet, MD Pharmacy:   [place], [place] - [address] [place] [address] Phone: [phone] Fax: [phone]     Social Determinants of Health (SDOH) Interventions    Readmission Risk Interventions Readmission Risk Prevention Plan 02/02/2019 01/31/2019 11/17/2018  Transportation Screening Complete - -  Medication Review ([place]) Complete Complete -  PCP or Specialist appointment within 3-5 days of discharge Complete - Complete  PCP/Specialist Appt Not Complete comments - - -  [place] or Home Care Consult Complete - -  SW Recovery Care/Counseling Consult Complete - -  Palliative Care Screening Complete - -  [place] Not Applicable - -  SNF Comments - - -  Some recent data might be hidden

## 2019-02-22 NOTE — Progress Notes (Signed)
Please note patient is currently followed by outpatient palliative through [facility]. CMRN Doran Clay made aware. Flo Shanks BSN, RN, [place] collective [phone]

## 2019-02-22 NOTE — Progress Notes (Signed)
PT Cancellation Note  Patient Details Name: Dylan [name] MRN: [id number] DOB: [birth year]   Cancelled Treatment:    Reason Eval/Treat Not Completed: Fatigue/lethargy limiting ability to participateWill re-attempt later if time allows.     Shamel Galyean 02/22/2019, 10:28 AM

## 2019-02-23 ENCOUNTER — Other Ambulatory Visit: Payer: Medicare Other | Admitting: Primary Care

## 2019-02-23 ENCOUNTER — Inpatient Hospital Stay: Payer: Medicare Other

## 2019-02-23 LAB — CBC
HCT: 30.9 % — ABNORMAL LOW (ref 39.0–52.0)
Hemoglobin: 9.7 g/dL — ABNORMAL LOW (ref 13.0–17.0)
MCH: 30.8 pg (ref 26.0–34.0)
MCHC: 31.4 g/dL (ref 30.0–36.0)
MCV: 98.1 fL (ref 80.0–100.0)
Platelets: 167 10*3/uL (ref 150–400)
RBC: 3.15 MIL/uL — ABNORMAL LOW (ref 4.22–5.81)
RDW: 17.2 % — ABNORMAL HIGH (ref 11.5–15.5)
WBC: 10.4 10*3/uL (ref 4.0–10.5)
nRBC: 0 % (ref 0.0–0.2)

## 2019-02-23 LAB — BASIC METABOLIC PANEL
Anion gap: 15 (ref 5–15)
BUN: 46 mg/dL — ABNORMAL HIGH (ref 8–23)
CO2: 25 mmol/L (ref 22–32)
Calcium: 8.5 mg/dL — ABNORMAL LOW (ref 8.9–10.3)
Chloride: 98 mmol/L (ref 98–111)
Creatinine, Ser: 1.31 mg/dL — ABNORMAL HIGH (ref 0.61–1.24)
GFR calc Af Amer: 59 mL/min — ABNORMAL LOW (ref >60)
GFR calc non Af Amer: 51 mL/min — ABNORMAL LOW (ref >60)
Glucose, Bld: 136 mg/dL — ABNORMAL HIGH (ref 70–99)
Potassium: 5 mmol/L (ref 3.5–5.1)
Sodium: 138 mmol/L (ref 135–145)

## 2019-02-23 LAB — GLUCOSE, CAPILLARY
Glucose-Capillary: 111 mg/dL — ABNORMAL HIGH (ref 70–99)
Glucose-Capillary: 111 mg/dL — ABNORMAL HIGH (ref 70–99)
Glucose-Capillary: 139 mg/dL — ABNORMAL HIGH (ref 70–99)
Glucose-Capillary: 166 mg/dL — ABNORMAL HIGH (ref 70–99)

## 2019-02-23 LAB — MRSA PCR SCREENING: MRSA by PCR: POSITIVE — AB

## 2019-02-23 MED ORDER — MUPIROCIN 2 % EX OINT
1.0000 "application " | TOPICAL_OINTMENT | Freq: Two times a day (BID) | CUTANEOUS | Status: DC
  Administered 2019-02-23 – 2019-02-24 (×3): 1 via NASAL
  Filled 2019-02-23: qty 22

## 2019-02-23 MED ORDER — VANCOMYCIN VARIABLE DOSE PER UNSTABLE RENAL FUNCTION (PHARMACIST DOSING): Status: DC

## 2019-02-23 MED ORDER — CHLORHEXIDINE GLUCONATE CLOTH 2 % EX PADS
6.0000 | MEDICATED_PAD | Freq: Every day | CUTANEOUS | Status: DC
  Administered 2019-02-24: 06:00:00 6 via TOPICAL

## 2019-02-23 MED ORDER — FUROSEMIDE 10 MG/ML IJ SOLN
40.0000 mg | Freq: Every day | INTRAMUSCULAR | Status: DC
  Administered 2019-02-24: 40 mg via INTRAVENOUS
  Filled 2019-02-23: qty 4

## 2019-02-23 NOTE — Progress Notes (Signed)
Pharmacy Antibiotic Note  Dylan [name] is a 83 y.o. male admitted on 02/21/2019 with sepsis.  Pharmacy has been consulted for vancomycin and cefepime dosing. Possible left lower lobe PNA. MRSA PCR already ordered by MD. Vancomycin 1g and cefepime 2g given in the ED.   Plan: Will continue dosing with cefepime 2 g IV q8h.   Patient's SCr continues to trend up. Will hold Vancomycin 1500 mg IV Q 24 hrs due at 17:00 and check a random Vancomycin level with AM labs (~36 hours after last dose). Placed order for Vancomycin variable dosing.  Goal AUC 400-550.  Continue to monitor renal function. BMP for tomorrow has been ordered.   Height: 5\' 10"  (177.8 cm) Weight: 286 lb 9.6 oz (130 kg) IBW/kg (Calculated) : 73  Temp (24hrs), Avg:98.1 F (36.7 C), Min:97.5 F (36.4 C), Max:98.7 F (37.1 C)  Recent Labs  Lab 02/21/19 0815 02/22/19 0439 02/23/19 0331  WBC 14.3* 14.0* 10.4  CREATININE 1.09 1.16 1.31*  LATICACIDVEN 1.5  --   --     Estimated Creatinine Clearance: 59.9 mL/min (A) (by C-G formula based on SCr of 1.31 mg/dL (H)).    No Known Allergies  Antimicrobials this admission: Cefepime 6/21 >> vanc 6/21 >>  Dose adjustments this admission:   Microbiology results: 6/21 BCx: pending 6/21 UCx: collected  MRSA PCR: ordered  Thank you for allowing pharmacy to be a part of this patient's care.  Paulina Fusi, PharmD, BCPS 02/23/2019 1:35 PM

## 2019-02-23 NOTE — Progress Notes (Signed)
Patient resting in bed. Has no acute distress or complaints. Repositioning Q2. Will continue to monitor.

## 2019-02-23 NOTE — Progress Notes (Signed)
[name] NAME: Dylan [name]    MR#:  [id number]  DATE OF BIRTH:  October 31, 1935  SUBJECTIVE:  CHIEF COMPLAINT:   Chief Complaint  Patient presents with  . Shortness of Breath  . Fever   Admitted for pneumonia and CHF.  On oxygen.  Denies any new complaints. Remains sleepy, but talks fine.  REVIEW OF SYSTEMS:  CONSTITUTIONAL: No fever, fatigue or weakness.  EYES: No blurred or double vision.  EARS, NOSE, AND THROAT: No tinnitus or ear pain.  RESPIRATORY: Have cough, shortness of breath, wheezing, no hemoptysis.  CARDIOVASCULAR: No chest pain, orthopnea, edema.  GASTROINTESTINAL: No nausea, vomiting, diarrhea or abdominal pain.  GENITOURINARY: No dysuria, hematuria.  ENDOCRINE: No polyuria, nocturia,  HEMATOLOGY: No anemia, easy bruising or bleeding SKIN: No rash or lesion. MUSCULOSKELETAL: No joint pain or arthritis.   NEUROLOGIC: No tingling, numbness, weakness.  PSYCHIATRY: No anxiety or depression.   ROS  DRUG ALLERGIES:  No Known Allergies  VITALS:  Blood pressure (!) 132/101, pulse 67, temperature 97.6 F (36.4 [name]), temperature source Oral, resp. rate 18, height 5\' 10"  (1.778 m), weight 130 kg, SpO2 100 %.  PHYSICAL EXAMINATION:  GENERAL:  83 y.o.-year-old patient lying in the bed with no acute distress.  EYES: Pupils equal, round, reactive to light and accommodation. No scleral icterus. Extraocular muscles intact.  HEENT: Head atraumatic, normocephalic. Oropharynx and nasopharynx clear.  NECK:  Supple, no jugular venous distention. No thyroid enlargement, no tenderness.  LUNGS: Normal breath sounds bilaterally, no wheezing, some crepitation. No use of accessory muscles of respiration.  CARDIOVASCULAR: S1, S2 normal. No murmurs, rubs, or gallops.  ABDOMEN: Soft, nontender, nondistended. Bowel sounds present. No organomegaly or mass.  EXTREMITIES: No pedal edema, cyanosis, or clubbing.  NEUROLOGIC: Cranial nerves II  through XII are intact. Muscle strength 4/5 in both upper and left lower extremities 1/5 in the right lower extremity. Sensation intact. Gait not checked.  PSYCHIATRIC: The patient is sleepy but easily arousable and oriented x 3.  SKIN: No obvious rash, lesion, or ulcer.   Physical Exam LABORATORY PANEL:   CBC Recent Labs  Lab 02/23/19 0331  WBC 10.4  HGB 9.7*  HCT 30.9*  PLT 167   ------------------------------------------------------------------------------------------------------------------  Chemistries  Recent Labs  Lab 02/21/19 0815  02/23/19 0331  NA 139   < > 138  K 4.8   < > 5.0  CL 101   < > 98  CO2 27   < > 25  GLUCOSE 155*   < > 136*  BUN 29*   < > 46*  CREATININE 1.09   < > 1.31*  CALCIUM 9.1   < > 8.5*  AST 29  --   --   ALT 24  --   --   ALKPHOS 119  --   --   BILITOT 1.0  --   --    < > = values in this interval not displayed.   ------------------------------------------------------------------------------------------------------------------  Cardiac Enzymes No results for input(s): TROPONINI in the last 168 hours. ------------------------------------------------------------------------------------------------------------------  RADIOLOGY:  Dg Chest 1 View  Result Date: 02/23/2019 CLINICAL DATA:  Hypoxia Hx myocardial infarction, hypertension, COPD, diastolic heart failure, CHF, CAD EXAM: CHEST  1 VIEW COMPARISON:  Radiograph 02/21/2019 FINDINGS: Stable enlarged cardiac silhouette. There is bilateral pleural effusion. LEFT basilar atelectasis. Mild central venous pulmonary congestion. Slight increase in effusions compared to prior. IMPRESSION: 1. Slight increase in bilateral pleural effusions. 2. Persistent pulmonary venous congestion  cardiomegaly. Electronically Signed   By: Dylan BiStewart  [name] M.D.   On: 02/23/2019 09:32    ASSESSMENT AND PLAN:   Active Problems:   Sepsis (HCC)  1.  Clinical sepsis with fever, leukocytosis, tachycardia  pneumonia  left lower lobe.  Urine analysis negative.  Follow-up cultures.  Aggressive antibiotics with vancomycin and cefepime.  Send off an MRSA PCR- positive.  First procalcitonin negative. 2.  Acute on chronic systolic congestive heart failure.    continue IV Lasix.  Patient on spironolactone, Coreg and Entresto   lasix held due to slight worsening in renal func. 3.  Nonambulatory.  CT scan of the chest to rule out pulmonary embolism-negative. 4.  Chronic respiratory failure.  Continue oxygen- 2 ltr at home 5.  COPD exacerbation. Solu-Medrol nebulizer treatments 6.  Obesity with a BMI of 35.3.  Weight loss needed 7.  Hyperlipidemia unspecified on atorvastatin 8.  GERD on PPI 9.  Stage II sacral decubitus  Patient is bedbound for last 6 months has home health, lift, wife taking care at home.  All the records are reviewed and case discussed with Care Management/Social Workerr. Management plans discussed with the patient, family and they are in agreement.  CODE STATUS: Full  TOTAL TIME TAKING CARE OF THIS PATIENT: 40 minutes.   I spoke to patient's wife on the phone informed about possible discharge tomorrow.  POSSIBLE D/[name] IN 1-2 DAYS, DEPENDING ON CLINICAL CONDITION.   Altamese DillingVaibhavkumar Jerni [name] M.D on 02/23/2019   Between 7am to 6pm - Pager - [phone]  After 6pm go to [email or web address] - password [email or web address]  [facility] [place] [facility]  Office  [phone]  CC: Primary care physician; Dylan Shaggyate, [name], MD  Note: This dictation was prepared with Dragon dictation along with smaller phrase technology. Any transcriptional errors that result from this process are unintentional.

## 2019-02-23 NOTE — Evaluation (Signed)
Physical Therapy Evaluation Patient Details Name: Dylan ClintonJerry B Yingst MRN: [id number] DOB: 1936/06/18 Today's Date: 02/23/2019   History of Present Illness  From MD H&P 02/21/19:  Pt is an 83 y.o. male with a known history of COPD on chronic oxygen and CHF presents with shortness of breath and fever.  He states her shortness of breath got worse yesterday.  No cough.  He states if he lies still he breathes okay but if any little movement he gets short of breath.  Patient states that he has not walked in about 6 months since he broke his leg.  Slight fever.  Slight sweats.  Decreased appetite.  In the ER, he had a low-grade temperature and elevated white count and hospitalist services were contacted for sepsis protocol.  Chest x-ray possibility of a left lower lobe pneumonia.  Also could have congestive heart failure on the chest x-ray.  Assessment includes: sepsis with fever, leukocytosis, tachycardia,  pneumonia left lower lobe, Acute on chronic systolic congestive heart failure, Chronic respiratory failure, COPD exacerbation, obesity, HLD, GERD, and stage II sacral decubitus.    Clinical Impression  Pt presents with deficits in strength, transfers, mobility, gait, balance, and activity tolerance.  Pt has been non-ambulatory for six months and is actively receiving HHPT to address the above deficits.  Pt required extensive +2 assist for bed mobility tasks and was unable to come to standing at the EOB but was able to somewhat unweight himself from the mattress during transfer attempts.  Pt reports that he has benefited from his HHPT services and would continue to benefit from HHPT for improved functional mobility and decreased caregiver assistance .        Follow Up Recommendations Supervision for mobility/OOB;Home health PT    Equipment Recommendations  None recommended by PT    Recommendations for Other Services       Precautions / Restrictions Precautions Precautions: Fall Restrictions Weight  Bearing Restrictions: No      Mobility  Bed Mobility Overal bed mobility: Needs Assistance Bed Mobility: Supine to Sit;Sit to Supine     Supine to sit: +2 for physical assistance;Max assist Sit to supine: +2 for physical assistance;Max assist   General bed mobility comments: Mod verbal cues for sequencing  Transfers                 General transfer comment: Pt able to partially unweight but unable to fully clear the mattress during sit to/from stand transfers.  Ambulation/Gait             General Gait Details: Unable  Stairs            Wheelchair Mobility    Modified Rankin (Stroke Patients Only)       Balance Overall balance assessment: Needs assistance Sitting-balance support: Feet supported;Bilateral upper extremity supported Sitting balance-Leahy Scale: Poor Sitting balance - Comments: Pt required time and cues to maintain independent static sitting balance at the EOB                                     Pertinent Vitals/Pain Pain Assessment: No/denies pain    Home Living Family/patient expects to be discharged to:: Private residence Living Arrangements: Spouse/significant other Available Help at Discharge: Family;Neighbor;Personal care attendant Type of Home: House Home Access: Ramped entrance     Home Layout: One level Home Equipment: Walker - 2 wheels;Cane - single point;Bedside commode;Shower seat;Grab bars -  tub/shower;Wheelchair - manual;Hospital bed Additional Comments: Pt has a hospital bed in the living room along with a hoyer lift that his PCA can assist him with ADLs. Wife takes care of cooking and IADLs    Prior Function Level of Independence: Needs assistance   Gait / Transfers Assistance Needed: Per patient, he has not been able to stand up or ambulate since his L femur ORIF and quad repair December of 2019.  Pt requires a hoyer lift for transfers.   ADL's / Homemaking Assistance Needed: Total assistance with  ADLs from his PCA  Comments: Pt has an Rt AFO for foot drop      Hand Dominance   Dominant Hand: Right    Extremity/Trunk Assessment   Upper Extremity Assessment Upper Extremity Assessment: Generalized weakness    Lower Extremity Assessment Lower Extremity Assessment: Generalized weakness RLE Deficits / Details: Chronic R foot drop       Communication   Communication: No difficulties  Cognition Arousal/Alertness: Awake/alert Behavior During Therapy: WFL for tasks assessed/performed Overall Cognitive Status: Within Functional Limits for tasks assessed                                        General Comments      Exercises Total Joint Exercises Ankle Circles/Pumps: AROM;Strengthening;Left;10 reps;15 reps Quad Sets: Strengthening;Both;10 reps;15 reps Gluteal Sets: Strengthening;Both;10 reps;15 reps Heel Slides: AAROM;Both;10 reps Hip ABduction/ADduction: AAROM;Both;10 reps Straight Leg Raises: AAROM;Both;10 reps Long Arc Quad: AROM;Both;10 reps Knee Flexion: AROM;Both;10 reps Other Exercises Other Exercises: Static sitting/core therex at EOB with tactile and verbal cues for upright posture x 5 min   Assessment/Plan    PT Assessment Patient needs continued PT services  PT Problem List Decreased strength;Decreased activity tolerance;Decreased balance;Decreased mobility       PT Treatment Interventions DME instruction;Gait training;Functional mobility training;Therapeutic activities;Therapeutic exercise;Balance training;Patient/family education    PT Goals (Current goals can be found in the Care Plan section)  Acute Rehab PT Goals Patient Stated Goal: To be able to walk again PT Goal Formulation: With patient Time For Goal Achievement: 03/08/19 Potential to Achieve Goals: Fair    Frequency Min 2X/week   Barriers to discharge        Co-evaluation               AM-PAC PT "6 Clicks" Mobility  Outcome Measure Help needed turning from  your back to your side while in a flat bed without using bedrails?: Total Help needed moving from lying on your back to sitting on the side of a flat bed without using bedrails?: Total Help needed moving to and from a bed to a chair (including a wheelchair)?: Total Help needed standing up from a chair using your arms (e.g., wheelchair or bedside chair)?: Total Help needed to walk in hospital room?: Total Help needed climbing 3-5 steps with a railing? : Total 6 Click Score: 6    End of Session Equipment Utilized During Treatment: Gait belt Activity Tolerance: Patient tolerated treatment well Patient left: in bed;with call bell/phone within reach;with bed alarm set Nurse Communication: Mobility status PT Visit Diagnosis: Muscle weakness (generalized) (M62.81);Difficulty in walking, not elsewhere classified (R26.2)    Time: 5643-3295 PT Time Calculation (min) (ACUTE ONLY): 39 min   Charges:   PT Evaluation $PT Eval Low Complexity: 1 Low PT Treatments $Therapeutic Exercise: 8-22 mins        D. Scott Alexius [place]  PT, DPT 02/23/19, 1:25 PM

## 2019-02-23 NOTE — TOC Progression Note (Signed)
Transition of Care [facility]) - Progression Note    Patient Details  Name: Dylan [name] MRN: [id number] Date of Birth: 1936-05-13  [facility] CM/SW Contact  Shelbie Hutching, RN Phone Number: 02/23/2019, 3:33 PM  Clinical Narrative:     RNCM was able to speak with patient's wife today, MD did reach out and talk with her earlier.  Wife just wants to make sure that the patient is not discharged too early, if he needs to stay and extra day then she wants him to stay and extra day.  Wife also wanted the staff to know that the head of the patient's penis is deformed and swollen and it is causing the patient to retain urine.  Wife would like for the MD to address this.  RNCM will touch base again with wife tomorrow.   Expected Discharge Plan: [place] Barriers to Discharge: Continued Medical Work up  Expected Discharge Plan and Services Expected Discharge Plan: [place]   Discharge Planning Services: CM Consult Post Acute Care Choice: Resumption of Svcs/PTA Provider Living arrangements for the past 2 months: [place]: [facility] Date [place]: 02/22/19 Time [place]: [phone] Representative spoke with at [place]: [place] ([place]) Interventions    Readmission Risk Interventions Readmission Risk Prevention Plan 02/02/2019 01/31/2019 11/17/2018  Transportation Screening Complete - -  Medication Review Press photographer) Complete Complete -  PCP or Specialist appointment within 3-5 days of discharge Complete - Complete  PCP/Specialist Appt Not Complete comments - - -  [place] or Home Care Consult Complete - -  SW Recovery Care/Counseling Consult Complete - -  Palliative Care Screening Complete - -  [place] Not Applicable - -  SNF Comments - - -  Some recent data might be hidden

## 2019-02-24 LAB — GLUCOSE, CAPILLARY
Glucose-Capillary: 117 mg/dL — ABNORMAL HIGH (ref 70–99)
Glucose-Capillary: 126 mg/dL — ABNORMAL HIGH (ref 70–99)
Glucose-Capillary: 107 mg/dL — ABNORMAL HIGH (ref 70–99)

## 2019-02-24 LAB — BASIC METABOLIC PANEL
Anion gap: 7 (ref 5–15)
BUN: 50 mg/dL — ABNORMAL HIGH (ref 8–23)
CO2: 27 mmol/L (ref 22–32)
Calcium: 7.8 mg/dL — ABNORMAL LOW (ref 8.9–10.3)
Chloride: 101 mmol/L (ref 98–111)
Creatinine, Ser: 1.27 mg/dL — ABNORMAL HIGH (ref 0.61–1.24)
GFR calc Af Amer: >60 mL/min (ref >60)
GFR calc non Af Amer: 53 mL/min — ABNORMAL LOW (ref >60)
Glucose, Bld: 128 mg/dL — ABNORMAL HIGH (ref 70–99)
Potassium: 4.2 mmol/L (ref 3.5–5.1)
Sodium: 135 mmol/L (ref 135–145)

## 2019-02-24 LAB — VANCOMYCIN, RANDOM: Vancomycin Rm: 16

## 2019-02-24 MED ORDER — VANCOMYCIN HCL 1.5 G IV SOLR
1500.0000 mg | Freq: Once | INTRAVENOUS | Status: AC
  Administered 2019-02-24: 1500 mg via INTRAVENOUS
  Filled 2019-02-24: qty 1500

## 2019-02-24 MED ORDER — DOXYCYCLINE MONOHYDRATE 100 MG PO TABS: 100.0000 mg | ORAL_TABLET | Freq: Two times a day (BID) | ORAL | 0 refills | Status: AC

## 2019-02-24 MED ORDER — LEVOFLOXACIN 500 MG PO TABS: 500.0000 mg | ORAL_TABLET | Freq: Every day | ORAL | 0 refills | Status: AC

## 2019-02-24 NOTE — Plan of Care (Signed)
  Problem: Education: Goal: Knowledge of General Education information will improve Description: Including pain rating scale, medication(s)/side effects and non-pharmacologic comfort measures Outcome: Adequate for Discharge   Problem: Health Behavior/Discharge Planning: Goal: Ability to manage health-related needs will improve Outcome: Adequate for Discharge   Problem: Clinical Measurements: Goal: Ability to maintain clinical measurements within normal limits will improve Outcome: Adequate for Discharge Goal: Will remain free from infection Outcome: Adequate for Discharge Goal: Diagnostic test results will improve Outcome: Adequate for Discharge Goal: Respiratory complications will improve Outcome: Adequate for Discharge Goal: Cardiovascular complication will be avoided Outcome: Adequate for Discharge   Problem: Activity: Goal: Risk for activity intolerance will decrease Outcome: Adequate for Discharge   Problem: Nutrition: Goal: Adequate nutrition will be maintained Outcome: Adequate for Discharge   Problem: Coping: Goal: Level of anxiety will decrease Outcome: Adequate for Discharge   Problem: Elimination: Goal: Will not experience complications related to bowel motility Outcome: Adequate for Discharge Goal: Will not experience complications related to urinary retention Outcome: Adequate for Discharge   Problem: Pain Managment: Goal: General experience of comfort will improve Outcome: Adequate for Discharge   Problem: Safety: Goal: Ability to remain free from injury will improve Outcome: Adequate for Discharge   Problem: Skin Integrity: Goal: Risk for impaired skin integrity will decrease Outcome: Adequate for Discharge   Problem: Acute Rehab PT Goals(only PT should resolve) Goal: Pt Will Go Sit To Supine/Side Outcome: Adequate for Discharge Goal: Patient Will Transfer Sit To/From Stand Outcome: Adequate for Discharge Goal: Pt/caregiver will Perform Home  Exercise Program Outcome: Adequate for Discharge

## 2019-02-24 NOTE — Discharge Summary (Signed)
[facility] [place] [facility]   PATIENT NAME: Dylan [name]    MR#:  [id number]  DATE OF BIRTH:  Feb 11, 1936  DATE OF ADMISSION:  02/21/2019 ADMITTING PHYSICIAN: Alford Highlandichard Wieting, MD  DATE OF DISCHARGE: 02/24/2019  PRIMARY CARE PHYSICIAN: Jaclyn Shaggyate, [name], MD    ADMISSION DIAGNOSIS:  Acute pulmonary edema (HCC) [J81.0] Dyspnea, unspecified type [R06.00] Sepsis, due to unspecified organism, unspecified whether acute organ dysfunction present (HCC) [A41.9]  DISCHARGE DIAGNOSIS:  Active Problems:   Sepsis (HCC)   SECONDARY DIAGNOSIS:   Past Medical History:  Diagnosis Date  . CAD (coronary artery disease)   . Cervicalgia   . CHF (congestive heart failure) (HCC)   . COPD (chronic obstructive pulmonary disease) (HCC)   . Diastolic heart failure (HCC)   . Foot drop, right   . History of kidney stones   . Hyperlipidemia    unspecified  . Hypertension   . Myocardial infarction (HCC)   . Osteoarthritis   . Shoulder pain, left   . Sleep apnea   . Tremor, essential     HOSPITAL COURSE:   1. Clinical sepsis with fever, leukocytosis, tachycardia  pneumonia left lower lobe. Urine analysis negative. Follow-up cultures. Aggressive antibiotics with vancomycin and cefepime. Send off an MRSA PCR- positive. First procalcitonin negative.  Will give doxycycline and levaquin on discharge. 2. Acute on chronic systolic congestive heart failure.   continue IV Lasix. Patient on spironolactone, Coreg and Entresto   lasix held due to slight worsening in renal func. Renal func now stable. 3. Nonambulatory. CT scan of the chest to rule out pulmonary embolism-negative. 4. Chronic respiratory failure. Continue oxygen- 2 ltr at home 5. COPD exacerbation. Solu-Medrol nebulizer treatments 6. Obesity with a BMI of 35.3. Weight loss needed 7. Hyperlipidemia unspecified on atorvastatin 8. GERD on PPI 9. Stage II sacral decubitus  Pt is home with full  services and home health- will resume all services.  DISCHARGE CONDITIONS:   Stable.  CONSULTS OBTAINED:    DRUG ALLERGIES:  No Known Allergies  DISCHARGE MEDICATIONS:   Allergies as of 02/24/2019   No Known Allergies     Medication List    STOP taking these medications   predniSONE 20 MG tablet Commonly known as: DELTASONE     TAKE these medications   acetaminophen 500 MG tablet Commonly known as: TYLENOL Take 1,000 mg by mouth 2 (two) times daily as needed for moderate pain.   alum & mag hydroxide-simeth 400-400-40 MG/5ML suspension Commonly known as: MAALOX PLUS Take 30 mLs by mouth every 4 (four) hours as needed for indigestion.   aspirin EC 81 MG tablet Take 81 mg by mouth daily.   atorvastatin 80 MG tablet Commonly known as: LIPITOR Take 80 mg by mouth daily.   carvedilol 6.25 MG tablet Commonly known as: COREG Take 6.25 mg by mouth 2 (two) times daily with a meal.   CENTROVITE Tabs Take 1 tablet by mouth daily.   Cholecalciferol 25 MCG (1000 UT) tablet Commonly known as: Vitamin D-1000 Max St Take 1 tablet (1,000 Units total) by mouth daily.   doxycycline 100 MG tablet Commonly known as: ADOXA Take 1 tablet (100 mg total) by mouth 2 (two) times daily for 3 days.   folic acid 400 MCG tablet Commonly known as: V-R FOLIC ACID Take 1 tablet (400 mcg total) by mouth daily.   furosemide 40 MG tablet Commonly known as: LASIX Take 40 mg by mouth 2 (two) times daily.  gabapentin 300 MG capsule Commonly known as: Neurontin Take 1 capsule (300 mg total) by mouth at bedtime.   ipratropium-albuterol 0.5-2.5 (3) MG/3ML Soln Commonly known as: DUONEB Take 3 mLs by nebulization every 6 (six) hours as needed (shortness of breath).   isosorbide mononitrate 30 MG 24 hr tablet Commonly known as: IMDUR Take 30 mg by mouth daily.   ketotifen 0.025 % ophthalmic solution Commonly known as: ZADITOR Place 1 drop into both eyes as needed.   levofloxacin 500  MG tablet Commonly known as: Levaquin Take 1 tablet (500 mg total) by mouth daily for 3 days.   loratadine 10 MG tablet Commonly known as: CLARITIN Take 1 tablet (10 mg total) by mouth as needed for allergies.   magnesium hydroxide 400 MG/5ML suspension Commonly known as: MILK OF MAGNESIA Take 30 mLs by mouth daily as needed for mild constipation.   nitroGLYCERIN 0.3 MG SL tablet Commonly known as: NITROSTAT Place 0.3 mg under the tongue every 5 (five) minutes as needed for chest pain.   pantoprazole 40 MG tablet Commonly known as: PROTONIX Take 1 tablet (40 mg total) by mouth daily.   polyethylene glycol 17 g packet Commonly known as: MIRALAX / GLYCOLAX Take 17 g by mouth 2 (two) times daily as needed. Hold for loose stools.   potassium chloride SA 20 MEQ tablet Commonly known as: K-DUR Take 1 tablet (20 mEq total) by mouth daily.   primidone 50 MG tablet Commonly known as: MYSOLINE Take 150 mg by mouth 2 (two) times daily.   QUEtiapine 50 MG tablet Commonly known as: SEROQUEL Take 1 tablet (50 mg total) by mouth at bedtime as needed.   sacubitril-valsartan 24-26 MG Commonly known as: ENTRESTO Take 1 tablet by mouth every 12 (twelve) hours.   sennosides-docusate sodium 8.6-50 MG tablet Commonly known as: SENOKOT-S Take 2 tablets by mouth daily as needed for constipation.   spironolactone 25 MG tablet Commonly known as: ALDACTONE Take 12.5 mg by mouth daily.   traZODone 50 MG tablet Commonly known as: DESYREL Take 50 mg by mouth at bedtime.   umeclidinium-vilanterol 62.5-25 MCG/INH Aepb Commonly known as: ANORO ELLIPTA Inhale 1 puff into the lungs as needed.   VITAMIN C PO Take 500 mg by mouth daily.        DISCHARGE INSTRUCTIONS:    Follow with PMD in 1-2 weeks.  If you experience worsening of your admission symptoms, develop shortness of breath, life threatening emergency, suicidal or homicidal thoughts you must seek medical attention immediately  by calling 911 or calling your MD immediately  if symptoms less severe.  You Must read complete instructions/literature along with all the possible adverse reactions/side effects for all the Medicines you take and that have been prescribed to you. Take any new Medicines after you have completely understood and accept all the possible adverse reactions/side effects.   Please note  You were cared for by a hospitalist during your hospital stay. If you have any questions about your discharge medications or the care you received while you were in the hospital after you are discharged, you can call the unit and asked to speak with the hospitalist on call if the hospitalist that took care of you is not available. Once you are discharged, your primary care physician will handle any further medical issues. Please note that NO REFILLS for any discharge medications will be authorized once you are discharged, as it is imperative that you return to your primary care physician (or establish a relationship  with a primary care physician if you do not have one) for your aftercare needs so that they can reassess your need for medications and monitor your lab values.    Today   CHIEF COMPLAINT:   Chief Complaint  Patient presents with  . Shortness of Breath  . Fever    HISTORY OF PRESENT ILLNESS:  Dylan [name]  is a 83 y.o. male with a known history of COPD on chronic oxygen and CHF presents with shortness of breath and fever.  He states her shortness of breath got worse yesterday.  No cough.  He states if he lies still he breathes okay but if any little movement he gets short of breath.  Patient states that he has not walked in about 6 months since he broke his leg.  Slight fever.  Slight sweats.  Decreased appetite.  In the ER, he had a low-grade temperature and elevated white count and hospitalist services were contacted for sepsis protocol.  Chest x-ray possibility of a left lower lobe pneumonia.  Also could  have congestive heart failure on the chest x-ray.   VITAL SIGNS:  Blood pressure (!) 143/76, pulse 61, temperature 98.5 F (36.9 C), temperature source Oral, resp. rate 20, height 5\' 10"  (1.778 m), weight 130 kg, SpO2 94 %.  I/O:    Intake/Output Summary (Last 24 hours) at 02/24/2019 1402 Last data filed at 02/24/2019 1222 Gross per 24 hour  Intake 360 ml  Output 525 ml  Net -165 ml    PHYSICAL EXAMINATION:   GENERAL:  83 y.o.-year-old patient lying in the bed with no acute distress.  EYES: Pupils equal, round, reactive to light and accommodation. No scleral icterus. Extraocular muscles intact.  HEENT: Head atraumatic, normocephalic. Oropharynx and nasopharynx clear.  NECK:  Supple, no jugular venous distention. No thyroid enlargement, no tenderness.  LUNGS: Normal breath sounds bilaterally, no wheezing, some crepitation. No use of accessory muscles of respiration.  CARDIOVASCULAR: S1, S2 normal. No murmurs, rubs, or gallops.  ABDOMEN: Soft, nontender, nondistended. Bowel sounds present. No organomegaly or mass.  EXTREMITIES: No pedal edema, cyanosis, or clubbing.  NEUROLOGIC: Cranial nerves II through XII are intact. Muscle strength 4/5 in both upper and left lower extremities 1/5 in the right lower extremity. Sensation intact. Gait not checked.  PSYCHIATRIC: The patient is sleepy but easily arousable and oriented x 3.  SKIN: No obvious rash, lesion, or ulcer.   DATA REVIEW:   CBC Recent Labs  Lab 02/23/19 0331  WBC 10.4  HGB 9.7*  HCT 30.9*  PLT 167    Chemistries  Recent Labs  Lab 02/21/19 0815  02/24/19 0438  NA 139   < > 135  K 4.8   < > 4.2  CL 101   < > 101  CO2 27   < > 27  GLUCOSE 155*   < > 128*  BUN 29*   < > 50*  CREATININE 1.09   < > 1.27*  CALCIUM 9.1   < > 7.8*  AST 29  --   --   ALT 24  --   --   ALKPHOS 119  --   --   BILITOT 1.0  --   --    < > = values in this interval not displayed.    Cardiac Enzymes No results for input(s):  TROPONINI in the last 168 hours.  Microbiology Results  Results for orders placed or performed during the hospital encounter of 02/21/19  Blood Culture (routine x  2)     Status: None (Preliminary result)   Collection Time: 02/21/19  8:15 AM   Specimen: BLOOD  Result Value Ref Range Status   Specimen Description BLOOD LEFT ANTECUBITAL  Final   Special Requests   Final    BOTTLES DRAWN AEROBIC ONLY Blood Culture results may not be optimal due to an inadequate volume of blood received in culture bottles   Culture   Final    NO GROWTH 3 DAYS Performed at [facility], [address] [place], [place] [address]    Report Status PENDING  Incomplete  Blood Culture (routine x 2)     Status: None (Preliminary result)   Collection Time: 02/21/19  8:15 AM   Specimen: BLOOD  Result Value Ref Range Status   Specimen Description BLOOD BLOOD RIGHT FOREARM  Final   Special Requests   Final    BOTTLES DRAWN AEROBIC AND ANAEROBIC Blood Culture results may not be optimal due to an excessive volume of blood received in culture bottles   Culture   Final    NO GROWTH 3 DAYS Performed at [facility], [address] [place], [place] [address]    Report Status PENDING  Incomplete  SARS Coronavirus 2 (CEPHEID- Performed in [facility] hospital lab), Hosp Order     Status: None   Collection Time: 02/21/19  8:15 AM   Specimen: Nasopharyngeal Swab  Result Value Ref Range Status   SARS Coronavirus 2 NEGATIVE NEGATIVE Final    Comment: (NOTE) If result is NEGATIVE SARS-CoV-2 target nucleic acids are NOT DETECTED. The SARS-CoV-2 RNA is generally detectable in upper and lower  respiratory specimens during the acute phase of infection. The lowest  concentration of SARS-CoV-2 viral copies this assay can detect is 250  copies / mL. A negative result does not preclude SARS-CoV-2 infection  and should not be used as the sole basis for treatment or other  patient management decisions.  A  negative result may occur with  improper specimen collection / handling, submission of specimen other  than nasopharyngeal swab, presence of viral mutation(s) within the  areas targeted by this assay, and inadequate number of viral copies  (<250 copies / mL). A negative result must be combined with clinical  observations, patient history, and epidemiological information. If result is POSITIVE SARS-CoV-2 target nucleic acids are DETECTED. The SARS-CoV-2 RNA is generally detectable in upper and lower  respiratory specimens dur ing the acute phase of infection.  Positive  results are indicative of active infection with SARS-CoV-2.  Clinical  correlation with patient history and other diagnostic information is  necessary to determine patient infection status.  Positive results do  not rule out bacterial infection or co-infection with other viruses. If result is PRESUMPTIVE POSTIVE SARS-CoV-2 nucleic acids MAY BE PRESENT.   A presumptive positive result was obtained on the submitted specimen  and confirmed on repeat testing.  While 2019 novel coronavirus  (SARS-CoV-2) nucleic acids may be present in the submitted sample  additional confirmatory testing may be necessary for epidemiological  and / or clinical management purposes  to differentiate between  SARS-CoV-2 and other Sarbecovirus currently known to infect humans.  If clinically indicated additional testing with an alternate test  methodology [phone]) is advised. The SARS-CoV-2 RNA is generally  detectable in upper and lower respiratory sp ecimens during the acute  phase of infection. The expected result is Negative. Fact Sheet for Patients:  BoilerBrush.com.cy Fact Sheet for Healthcare Providers: https://pope.com/ This test is  not yet approved or cleared by the [place] FDA and has been authorized for detection and/or diagnosis of SARS-CoV-2 by FDA under an Emergency Use  Authorization (EUA).  This EUA will remain in effect (meaning this test can be used) for the duration of the COVID-19 declaration under Section 564(b)(1) of the Act, 21 U.S.C. section 360bbb-3(b)(1), unless the authorization is terminated or revoked sooner. Performed at [facility], [address] [place], [place] [address]   Urine culture     Status: Abnormal   Collection Time: 02/21/19 10:47 AM   Specimen: Urine, Random  Result Value Ref Range Status   Specimen Description   Final    URINE, RANDOM Performed at [facility], [address] [place], [place] [address]    Special Requests   Final    NONE Performed at [facility], [address] [place], [place] [address]    Culture MULTIPLE SPECIES PRESENT, SUGGEST RECOLLECTION (A)  Final   Report Status 02/22/2019 FINAL  Final  MRSA PCR Screening     Status: Abnormal   Collection Time: 02/23/19  9:13 AM   Specimen: Nasal Mucosa; Nasopharyngeal  Result Value Ref Range Status   MRSA by PCR POSITIVE (A) NEGATIVE Final    Comment:        The GeneXpert MRSA Assay (FDA approved for NASAL specimens only), is one component of a comprehensive MRSA colonization surveillance program. It is not intended to diagnose MRSA infection nor to guide or monitor treatment for MRSA infections. RESULT CALLED TO, READ BACK BY AND VERIFIED WITH: MALKA SINWANY AT 1146 ON 02/23/2019 [facility]. Performed at [facility], [address] [place], [place] [address]     RADIOLOGY:  Dg Chest 1 View  Result Date: 02/23/2019 CLINICAL DATA:  Hypoxia Hx myocardial infarction, hypertension, COPD, diastolic heart failure, CHF, CAD EXAM: CHEST  1 VIEW COMPARISON:  Radiograph 02/21/2019 FINDINGS: Stable enlarged cardiac silhouette. There is bilateral pleural effusion. LEFT basilar atelectasis. Mild central venous pulmonary congestion. Slight increase in effusions compared to prior. IMPRESSION: 1. Slight increase in  bilateral pleural effusions. 2. Persistent pulmonary venous congestion cardiomegaly. Electronically Signed   By: Genevive BiStewart  Edmunds M.D.   On: 02/23/2019 09:32    EKG:   Orders placed or performed during the hospital encounter of 02/21/19  . EKG 12-Lead  . EKG 12-Lead  . ED EKG 12-Lead  . ED EKG 12-Lead      Management plans discussed with the patient, family and they are in agreement.  CODE STATUS:     Code Status Orders  (From admission, onward)         Start     Ordered   02/21/19 1126  Full code  Continuous     02/21/19 1125        Code Status History    Date Active Date Inactive Code Status Order ID Comments User Context   01/30/2019 1805 02/03/2019 1754 Full Code [id number]  Jama Flavorsjie, Jude, MD ED   11/23/2018 0619 12/01/2018 1541 Full Code [id number]  Arnaldo Nataliamond, Michael S, MD Inpatient   11/14/2018 0314 11/19/2018 1822 Full Code [id number]  Mansy, Vernetta HoneyJan A, MD ED   10/27/2018 2101 10/31/2018 2110 Partial Code [id number]  Altamese DillingVachhani, Symia Herdt, MD Inpatient   08/26/2018 1137 09/04/2018 1912 Full Code [id number]  Kennedy BuckerMenz, Michael, MD ED   06/21/2018 1321 06/24/2018 2008 Partial Code [id number]  Milagros LollSudini, Srikar, MD Inpatient   06/21/2018 0803 06/21/2018 1321 Full Code [id number]  Iran OuchArida, Muhammad A, MD Inpatient  06/01/2018 2009 06/04/2018 1718 Full Code [id number]  Donato Heinz, MD Inpatient   01/11/2018 0012 01/14/2018 1908 Full Code [id number]  Cammy Copa, MD Inpatient   12/01/2017 1924 12/05/2017 1948 Full Code [id number]  Altamese Dilling, MD Inpatient   08/31/2017 0557 09/05/2017 1959 Full Code [id number]  Arnaldo Natal, MD Inpatient   06/10/2016 1934 06/13/2016 1816 Full Code [id number]  Shaune Pollack, MD Inpatient   12/15/2015 0134 12/16/2015 1507 Full Code [id number]  Ihor Austin, MD ED   Advance Care Planning Activity    Advance Directive Documentation     Most Recent Value  Type of Advance Directive  Healthcare Power of Attorney, Living will  Pre-existing out of facility DNR order  (yellow form or pink MOST form)  -  "MOST" Form in Place?  -      TOTAL TIME TAKING CARE OF THIS PATIENT: 35 minutes.    Altamese Dilling M.D on 02/24/2019 at 2:02 PM  Between 7am to 6pm - Pager - [phone]  After 6pm go to [email or web address] - password [email or web address]  [facility]  Office  [phone]  CC: Primary care physician; [name], MD   Note: This dictation was prepared with Dragon dictation along with smaller phrase technology. Any transcriptional errors that result from this process are unintentional.

## 2019-02-24 NOTE — TOC Transition Note (Signed)
Transition of Care [facility]) - CM/SW Discharge Note   Patient Details  Name: Dylan [name] MRN: [id number] Date of Birth: 06-07-1936  [facility]) CM/SW Contact:  Shelbie Hutching, RN Phone Number: 02/24/2019, 2:45 PM   Clinical Narrative:    Patient ready for discharge, patient's wife has been updated by MD.  EMS will provide transport.     Final next level of care: [place] Barriers to Discharge: Barriers Resolved   Patient Goals and CMS Choice        Discharge Placement                       Discharge Plan and Services   Discharge Planning Services: CM Consult Post Acute Care Choice: Resumption of Svcs/PTA Provider                    HH Arranged: RN, PT, Nurse's Aide [place] Agency: [facility] Date [place]: 02/24/19 Time [place]: [place] Representative spoke with at [place]: [place] (SDOH) Interventions     Readmission Risk Interventions Readmission Risk Prevention Plan 02/02/2019 01/31/2019 11/17/2018  Transportation Screening Complete - -  Medication Review Press photographer) Complete Complete -  PCP or Specialist appointment within 3-5 days of discharge Complete - Complete  PCP/Specialist Appt Not Complete comments - - -  [place] or Home Care Consult Complete - -  SW Recovery Care/Counseling Consult Complete - -  Palliative Care Screening Complete - -  [place] Not Applicable - -  SNF Comments - - -  Some recent data might be hidden

## 2019-02-24 NOTE — Progress Notes (Signed)
Pharmacy Antibiotic Note  Dylan [name] is a 83 y.o. male admitted on 02/21/2019 with sepsis.  Pharmacy has been consulted for vancomycin and cefepime dosing. Possible left lower lobe PNA. MRSA PCR already ordered by MD. Vancomycin 1g and cefepime 2g given in the ED.   Plan: Will continue dosing with cefepime 2 g IV q8h.   Patient's SCr continues to trend up. Placed order for Vancomycin variable dosing. Vancomycin random (36hr post dose) 16 mcg/ml.  Will order Vancomycin 1500mg  IV once today. Follow up on SCr in AM, if stable can recalculate Vancomycin dosing.  Goal AUC 400-550.  Continue to monitor renal function. BMP for tomorrow has been ordered.   Height: 5\' 10"  (177.8 cm) Weight: 286 lb 9.6 oz (130 kg) IBW/kg (Calculated) : 73  Temp (24hrs), Avg:98 F (36.7 C), Min:97.2 F (36.2 C), Max:98.5 F (36.9 C)  Recent Labs  Lab 02/21/19 0815 02/22/19 0439 02/23/19 0331 02/24/19 0438  WBC 14.3* 14.0* 10.4  --   CREATININE 1.09 1.16 1.31* 1.27*  LATICACIDVEN 1.5  --   --   --   VANCORANDOM  --   --   --  16    Estimated Creatinine Clearance: 61.8 mL/min (A) (by C-G formula based on SCr of 1.27 mg/dL (H)).    No Known Allergies  Antimicrobials this admission: Cefepime 6/21 >> vanc 6/21 >>  Microbiology results: 6/21 BCx: NGTD 6/21 UCx: insignificant growth MRSA PCR: positive  Thank you for allowing pharmacy to be a part of this patient's care.  Paulina Fusi, PharmD, BCPS 02/24/2019 10:44 AM

## 2019-02-26 LAB — CULTURE, BLOOD (ROUTINE X 2)
Culture: NO GROWTH
Culture: NO GROWTH

## 2019-03-03 ENCOUNTER — Telehealth (HOSPITAL_COMMUNITY): Payer: Self-pay

## 2019-03-03 NOTE — Telephone Encounter (Signed)
Contacted Dylan [name] for a phone appt.  He states doing ok, he states feels like he is getting a urinary tract infection, burning when he urinates.  Dylan [name] with [facility] to possibly get a urine sample to test, he states will get in touch with Dylan [name] for order.  Dylan [name] states sleeping well at night, using Bipap most of the time.  He states appetite is good.  He watches his fluid intake.  He denies chest pain, shortness of breath, headaches or dizziness.  He is full assist to move, he states did get in wheel chair and sat outside some this week.  He gets up out of bed everyday and gets in a recliner with feet up.  He has home aid 24 hours a day and wife at night and weekends.  He has [facility] coming out.  He states swelling is ok, hs some but has socks on to help them.  Will continue to visit for heart failure.  Will contact wife and advise Dylan [name] will be getting a urine sample.   [place] [phone]

## 2019-03-04 ENCOUNTER — Other Ambulatory Visit: Payer: Self-pay

## 2019-03-04 ENCOUNTER — Other Ambulatory Visit: Payer: Medicare Other | Admitting: Primary Care

## 2019-03-04 DIAGNOSIS — Z515 Encounter for palliative care: Secondary | ICD-10-CM

## 2019-03-04 NOTE — Progress Notes (Signed)
[facility] Consult Note Telephone: [phone]  Fax: [phone]  PATIENT NAME: Dylan [name] DOB: 23-Aug-1936 MRN: [id number]  PRIMARY CARE PROVIDER:   Albina Billet, [place]  REFERRING PROVIDER:  Albina Billet, MD [address]   [place],  [place] [address] [phone]  RESPONSIBLE PARTY:   Extended Emergency Contact Information Primary Emergency Contact: Ybarra,[name] Address: [place], [place] [address] [place] Phone: [phone] Relation: Spouse Secondary Emergency Contact: [place] Phone: [phone] Mobile Phone: [phone] Relation: Son  Palliative Care was asked to follow this patient by consultation request of Albina Billet, MD. This is a follow up visit.  ASSESSMENT AND RECOMMENDATIONS:   1. Goals of Care: Maximize quality of life and symptom management. Home health agency is currently seeing patient.  2. Symptom Management:   Dyspnea: Patient has Trilogy100 via face mask. Has poor fit over beard with leakage due to beard. Using it night and day although he removes it for our interview, and caregiver says he does not use it consistently. Had a CPAP which he did not use when he had it.  Has oxygen at 2 L per nasal canula.   Patient reports occasional strangulaton on thin liquids. Recommend thickener and ST consultation for swallowing assessment and techniques.  CHF: States swollen scrotum, has pain when he urinates and is mostly incontinent. Declining rapidly with increasing weakness and  CV decompensation.  Constipation: Needs to increase senna to 2 bid and miralax bid. Also fiber gummies will help with fiber.   Urination: Has history of retention and some days does not void enough, other days. Is taking tamsulosin, not on med list on Epic. States soreness and swelling in scrotum. I recommend an indwelling catheter despite the  risk for UTI, in order to preserve skin integrity and for probable partial  retention.  Foot wound: Open wound with possible bone exposure on R foot. Followed by podiatry. Healing may be a difficult goal.  Function: Patient is very debilitated with little potential for rehab. Having him comfortable at this point vs rehabilitation is a more reasonable goal.  Hospice admission now is recommended due to debility.   3. Family /Caregiver/Community Supports: Has round the clock caregivers, wife who works full time.Marland Kitchen He has children from the first marriage (his wife died in Dec 23, 2002)  involved in his care. Has home health care currently. I spoke with home health RN who knows family well. We discussed that patient has limited rehab potential and would benefit from hospice at this time.Patient often speaks of how he cared for his wife during her illness.  4. Cognitive / Functional decline:  Alert and oriented x 3, but forgetful about events. Now functionaly declined to little ablility for adls (can feed self with a spoon) and no IADLS.  5. Advanced Care Directive: Discussed goals of care, patient wants to get stronger. Left MOST form for patient and wife to discuss.We discussed hospice again, I let him know they could support him in his desire to stop going to hospital. He stated in our conversation that he was tired of being in a hospital and we discussed that his disease may be at a point that there was not much they could do at the hospital, short of ICU care.  6. Follow up Palliative Care Visit: Palliative care will continue to follow for  goals of care clarification and symptom management. Return 2-3 weeks or prn. Appropriate now for hospice referral.   I spent 60 minutes providing this consultation,  from 1115 to 1215. More than 50% of the time in this consultation was spent coordinating communication.   HISTORY OF PRESENT ILLNESS:  Dylan [name] is a 83 y.o. year old male with multiple medical problems  including CHF, recent left femur fracture, s/p left knee joint replacement, debility, obesity, anxiety disorder , PVD, CAD, s/p STEMI.. Palliative Care was asked to help address goals of care.   CODE STATUS: MOST form left in home for review.  PPS: 30% HOSPICE ELIGIBILITY/DIAGNOSIS: yes/CHF, CAD  PAST MEDICAL HISTORY:  Past Medical History:  Diagnosis Date  . CAD (coronary artery disease)   . Cervicalgia   . CHF (congestive heart failure) (HCC)   . COPD (chronic obstructive pulmonary disease) (HCC)   . Diastolic heart failure (HCC)   . Foot drop, right   . History of kidney stones   . Hyperlipidemia    unspecified  . Hypertension   . Myocardial infarction (HCC)   . Osteoarthritis   . Shoulder pain, left   . Sleep apnea   . Tremor, essential     SOCIAL HX:  Social History   Tobacco Use  . Smoking status: Former Smoker    Packs/day: 0.25    Years: 58.00    Pack years: 14.50    Types: Cigarettes  . Smokeless tobacco: Former NeurosurgeonUser  . Tobacco comment: quit the begginning of septembet 2019  Substance Use Topics  . Alcohol use: No    Alcohol/week: 0.0 standard drinks    ALLERGIES: No Known Allergies   PERTINENT MEDICATIONS:  Outpatient Encounter Medications as of 03/04/2019  Medication Sig  . acetaminophen (TYLENOL) 500 MG tablet Take 1,000 mg by mouth 2 (two) times daily as needed for moderate pain.  Marland Kitchen. alum & mag hydroxide-simeth (MAALOX PLUS) 400-400-40 MG/5ML suspension Take 30 mLs by mouth every 4 (four) hours as needed for indigestion.  . Ascorbic Acid (VITAMIN C PO) Take 500 mg by mouth daily.   Marland Kitchen. aspirin EC 81 MG tablet Take 81 mg by mouth daily.  Marland Kitchen. atorvastatin (LIPITOR) 80 MG tablet Take 80 mg by mouth daily.   . carvedilol (COREG) 6.25 MG tablet Take 6.25 mg by mouth 2 (two) times daily with a meal.  . Cholecalciferol (VITAMIN D-1000 MAX ST) 25 MCG (1000 UT) tablet Take 1 tablet (1,000 Units total) by mouth daily.  . folic acid (V-R FOLIC ACID) 400 MCG tablet Take  1 tablet (400 mcg total) by mouth daily.  . furosemide (LASIX) 40 MG tablet Take 40 mg by mouth 2 (two) times daily.  Marland Kitchen. gabapentin (NEURONTIN) 300 MG capsule Take 1 capsule (300 mg total) by mouth at bedtime.  Marland Kitchen. ipratropium-albuterol (DUONEB) 0.5-2.5 (3) MG/3ML SOLN Take 3 mLs by nebulization every 6 (six) hours as needed (shortness of breath).   . isosorbide mononitrate (IMDUR) 30 MG 24 hr tablet Take 30 mg by mouth daily.   Marland Kitchen. ketotifen (ZADITOR) 0.025 % ophthalmic solution Place 1 drop into both eyes as needed.  . loratadine (CLARITIN) 10 MG tablet Take 1 tablet (10 mg total) by mouth as needed for allergies.  . magnesium hydroxide (MILK OF MAGNESIA) 400 MG/5ML suspension Take 30 mLs by mouth daily as needed for mild constipation.   . Multiple Vitamins-Minerals (CENTROVITE) TABS Take 1 tablet by mouth daily.  . nitroGLYCERIN (NITROSTAT) 0.3 MG SL tablet Place 0.3  mg under the tongue every 5 (five) minutes as needed for chest pain.  . pantoprazole (PROTONIX) 40 MG tablet Take 1 tablet (40 mg total) by mouth daily.  . polyethylene glycol (MIRALAX / GLYCOLAX) packet Take 17 g by mouth 2 (two) times daily as needed. Hold for loose stools.  . potassium chloride SA (K-DUR) 20 MEQ tablet Take 1 tablet (20 mEq total) by mouth daily.  . primidone (MYSOLINE) 50 MG tablet Take 150 mg by mouth 2 (two) times daily.   . QUEtiapine (SEROQUEL) 50 MG tablet Take 1 tablet (50 mg total) by mouth at bedtime as needed.  . sacubitril-valsartan (ENTRESTO) 24-26 MG Take 1 tablet by mouth every 12 (twelve) hours.  . sennosides-docusate sodium (SENOKOT-S) 8.6-50 MG tablet Take 2 tablets by mouth daily as needed for constipation.  Marland Kitchen. spironolactone (ALDACTONE) 25 MG tablet Take 12.5 mg by mouth daily.   . traZODone (DESYREL) 50 MG tablet Take 50 mg by mouth at bedtime.  Marland Kitchen. umeclidinium-vilanterol (ANORO ELLIPTA) 62.5-25 MCG/INH AEPB Inhale 1 puff into the lungs as needed.   No facility-administered encounter medications  on file as of 03/04/2019.     PHYSICAL EXAM/ROS:   HR 70, RR 20  Current and past weights: 280 lbs reported General: NAD, frail appearing, obese Cardiovascular: no chest pain reported, 1+ LE edema, dependent scrotal edema Pulmonary: no cough, no increased SOB, 96% on 2 L oxygen Abdomen: appetite good for breakfast, does not eat lunch or dinner. Anorexia, takes supplement,  Endorses constipation, incontinent of bowel GU: endorses dysuria, incontinent of urine, likely retaining urine, we discussed a catheter which wife endorses MSK: no joint deformities, extreme weakness, cannot bear weight. Transferred with hoyer lift. Caregiver states he has lost resolve from recent CVA dx.  Skin:  has Stage 1 on sacrum, scrotum with excoriation Neurological: Weakness, alert, oriented today but periods of less alertness and orientation. Able to participate in decision making.  [place] FileKathryn M Tammra Pressman DNP, AGPCNP-BC  Home health agency is [facility] and RN is Trey PaulaJeff.

## 2019-03-12 ENCOUNTER — Telehealth: Payer: Self-pay | Admitting: Primary Care

## 2019-03-12 NOTE — Telephone Encounter (Signed)
T/c to wife to discuss Patient status and disposition. She stated she found MOST form left and had some questions. She asked for NP to meet with family on a Friday afternoon around 6 pm for a family meeting. Wife works full time and cannot be home before 5:30 pm. Scheduled for 2 weeks, 7/24, due to schedule limits. We discussed patient decline and need to discuss the supportive services hospice can give family and patient. I am concerned about the length of time and will speak to the nursing supervisor of hospice to see if an education visit can be provided earlier,  if needed. I instructed her to phone me and we would set up a firm appointment. Wife voiced understanding.

## 2019-03-16 ENCOUNTER — Telehealth: Payer: Self-pay

## 2019-03-16 ENCOUNTER — Telehealth (HOSPITAL_COMMUNITY): Payer: Self-pay

## 2019-03-16 NOTE — Telephone Encounter (Signed)
Received call from patient's wife, [name] reporting that they have been talking with [facility] NP, Katie about hospice and they have decided to proceed with hospice. Wife reports that there are no urgent changes. Report of call given to NP and Medical Director for follow up.

## 2019-03-16 NOTE — Telephone Encounter (Signed)
Today had a telephone visit with [name].  He states he has a foley catheter now which is helping him urinate.  He states feeling ok, he has no complaint of pain or problems.  He states drinking prune juice today due to constipation.  He has all his medications, his wife takes car of those.  He has [facility] visiting.  He is up in his chair today.  He states sleeps some at night with cpap.  He has some swelling in his legs today.  He denies shortness of breath and he is speaking in full sentences.  Denies chest pain, headaches or dizziness.  He has eat today-eggs, sausage, toast and orange juice.  They measure his fluids daily in cups.  He states he does not need anything, he has a 24 hour aid that is with him.  His wife contacted Hospice today to get them started, they are wanting them to come in.  Will continue to visit for heart failure.   [place] [phone]

## 2019-03-17 ENCOUNTER — Telehealth: Payer: Self-pay | Admitting: Primary Care

## 2019-03-17 NOTE — Telephone Encounter (Signed)
T/c from Dylan [name] RE hospice care. They want hospice now, prefer a Friday admission this week, late afternoon so most of the family can be there. She is his second wife and wants to include Mr. Woehler' sons and Mr. Virani' sister. She is realistic and knows he is at end of life with limited prognosis, < 6 months. His PPS is 30%, unable to get up, transfers with hoyer. He is eating less, taking some supplements. Wts unavailable but in decline, uncontrolled pain. Has urinary cath for retention. He has CHF which has had frequent exacerbations, and he has said he does not want to return to hospital. Have communicated with [facility] referral RE requested admission and with [facility] that family is requesting hospice admission.

## 2019-03-17 NOTE — Progress Notes (Deleted)
   Patient ID: Dylan [name], male    DOB: 09-Apr-1936, 83 y.o.   MRN: [id number]  HPI  Dylan [name] is a 83 y/o male with a history of  Echo report from 11/18/2018 reviewed and showed an EF of 35-40% along with moderate Dylan along with mild/moderate TR.   Catheterization done 06/21/18 and showed:  Prox RCA lesion is 40% stenosed.  Mid RCA to Dist RCA lesion is 100% stenosed.  Dist RCA lesion is 100% stenosed.  Post intervention, there is a 0% residual stenosis.  A drug-eluting stent was successfully placed using a STENT RESOLUTE ONYX 4.0X38.  Post intervention, there is a 0% residual stenosis.  A drug-eluting stent was successfully placed using a STENT RESOLUTE ONYX 4.0X22.  Mid LAD lesion is 40% stenosed.   1.  Severe one-vessel coronary artery disease with thrombotic occlusion of the mid and distal right coronary artery with large thrombus burden.  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  Otherwise mild disease affecting the LAD and left circumflex. 2.  Left ventricular angiography with a TIG catheter was not diagnostic due to significant cardiomegaly.  Will obtain an echocardiogram. 3.  Normal left ventricular end-diastolic pressure. 4.  Successful complex angioplasty, aspiration thrombectomy and 2 overlapped drug-eluting stent placement to the right coronary artery.  Very difficult and long procedure due to aneurysmal RCA with large thrombus burden.  Patient admitted 02/21/2019 due to sepsis due to pneumonia along with acute on chronic HF. Antibiotics were given. Initially given IV lasix and transitioned to oral diuretics. Chest CT was negative for PE. Discharged after 3 days.   Review of Systems    Physical Exam    Assessment & Plan:  1. Chronic heart failure with reduced ejection fraction- - NYHA class III - euvolemic per patient's descriptionof symptoms - unable to weigh safely at home due to patient's inability to support himself on his legs; currently must use hoyer lift  to transfer patient - since he is unable to weigh daily, discussed importance of monitoring for swelling in his legs/ abdomen by pressing on skin or being aware if clothes are more tight fitting - not adding salt to his food and the aides/ family that cook for him also do not add salt while cooking -patient does not have a medication list available; says that his wife fixes his pill box and she works during the day - saw cardiology ([place])02/16/2019 - palliative care visit was done 02/09/2019 - participating in paramedic program - BNP 11/23/2018 was 850.0  2: HTN- -does not have a BP to report - he's unsure of when he last saw PCP [facility]) - BMP from 02/24/2019 reviewed and showed sodium 135, potassium 4.2, creatinine 1.27 and GFR 53  3: COPD-  - wears oxygen at 2L at bedtime - getting used to wearing his bipap and says that he wears it ~ 4-6 hours/ night - saw pulmonology ([place])01/07/2019  4: Weakness- - patient was able to walk / stand December 2019 and has been unable to do so since that time - currently is being transferred at home via a hoyer lift -PT coming in 3 times / week

## 2019-03-18 ENCOUNTER — Ambulatory Visit: Payer: Medicare Other | Admitting: Family

## 2019-03-22 ENCOUNTER — Telehealth: Payer: Self-pay | Admitting: Primary Care

## 2019-03-22 NOTE — Care Management (Signed)
Post discharge note entry 03/22/19 1112A: Call from patient's wife requesting assistance with transition to Bipap from Trilogy as patient is now on hospice care.  Contact information for Brad with [employer] shared with her. I have updated Dylan [name] of this request.

## 2019-03-22 NOTE — Telephone Encounter (Signed)
Opened in error

## 2019-03-22 NOTE — Telephone Encounter (Signed)
Called and left message, faxed and internally messaged Dr. Raul Del for assistance with bipap order. Requested fax or  call back.

## 2019-03-28 ENCOUNTER — Encounter: Payer: Self-pay | Admitting: Emergency Medicine

## 2019-03-28 ENCOUNTER — Emergency Department

## 2019-03-28 ENCOUNTER — Inpatient Hospital Stay
Admission: EM | Admit: 2019-03-28 | Discharge: 2019-04-02 | DRG: 208 | Disposition: A | Discharge status: Hospice | Attending: Internal Medicine | Admitting: Internal Medicine

## 2019-03-28 DIAGNOSIS — Z87442 Personal history of urinary calculi: Secondary | ICD-10-CM

## 2019-03-28 DIAGNOSIS — R7881 Bacteremia: Secondary | ICD-10-CM | POA: Diagnosis not present

## 2019-03-28 DIAGNOSIS — G931 Anoxic brain damage, not elsewhere classified: Secondary | ICD-10-CM | POA: Diagnosis present

## 2019-03-28 DIAGNOSIS — Z833 Family history of diabetes mellitus: Secondary | ICD-10-CM

## 2019-03-28 DIAGNOSIS — R001 Bradycardia, unspecified: Secondary | ICD-10-CM | POA: Diagnosis present

## 2019-03-28 DIAGNOSIS — J441 Chronic obstructive pulmonary disease with (acute) exacerbation: Secondary | ICD-10-CM | POA: Diagnosis present

## 2019-03-28 DIAGNOSIS — L89312 Pressure ulcer of right buttock, stage 2: Secondary | ICD-10-CM | POA: Diagnosis present

## 2019-03-28 DIAGNOSIS — M1990 Unspecified osteoarthritis, unspecified site: Secondary | ICD-10-CM | POA: Diagnosis present

## 2019-03-28 DIAGNOSIS — Z6841 Body Mass Index (BMI) 40.0 and over, adult: Secondary | ICD-10-CM

## 2019-03-28 DIAGNOSIS — Z8052 Family history of malignant neoplasm of bladder: Secondary | ICD-10-CM

## 2019-03-28 DIAGNOSIS — Z5329 Procedure and treatment not carried out because of patient's decision for other reasons: Secondary | ICD-10-CM | POA: Diagnosis not present

## 2019-03-28 DIAGNOSIS — Z801 Family history of malignant neoplasm of trachea, bronchus and lung: Secondary | ICD-10-CM

## 2019-03-28 DIAGNOSIS — D631 Anemia in chronic kidney disease: Secondary | ICD-10-CM | POA: Diagnosis present

## 2019-03-28 DIAGNOSIS — Z9981 Dependence on supplemental oxygen: Secondary | ICD-10-CM

## 2019-03-28 DIAGNOSIS — F419 Anxiety disorder, unspecified: Secondary | ICD-10-CM | POA: Diagnosis present

## 2019-03-28 DIAGNOSIS — Z20828 Contact with and (suspected) exposure to other viral communicable diseases: Secondary | ICD-10-CM | POA: Diagnosis present

## 2019-03-28 DIAGNOSIS — N182 Chronic kidney disease, stage 2 (mild): Secondary | ICD-10-CM | POA: Diagnosis present

## 2019-03-28 DIAGNOSIS — Z800 Family history of malignant neoplasm of digestive organs: Secondary | ICD-10-CM

## 2019-03-28 DIAGNOSIS — R0602 Shortness of breath: Secondary | ICD-10-CM

## 2019-03-28 DIAGNOSIS — J449 Chronic obstructive pulmonary disease, unspecified: Secondary | ICD-10-CM

## 2019-03-28 DIAGNOSIS — I2510 Atherosclerotic heart disease of native coronary artery without angina pectoris: Secondary | ICD-10-CM | POA: Diagnosis present

## 2019-03-28 DIAGNOSIS — G4733 Obstructive sleep apnea (adult) (pediatric): Secondary | ICD-10-CM | POA: Diagnosis present

## 2019-03-28 DIAGNOSIS — N401 Enlarged prostate with lower urinary tract symptoms: Secondary | ICD-10-CM | POA: Diagnosis present

## 2019-03-28 DIAGNOSIS — L89322 Pressure ulcer of left buttock, stage 2: Secondary | ICD-10-CM | POA: Diagnosis present

## 2019-03-28 DIAGNOSIS — Z79899 Other long term (current) drug therapy: Secondary | ICD-10-CM

## 2019-03-28 DIAGNOSIS — Z8249 Family history of ischemic heart disease and other diseases of the circulatory system: Secondary | ICD-10-CM | POA: Diagnosis not present

## 2019-03-28 DIAGNOSIS — R579 Shock, unspecified: Secondary | ICD-10-CM | POA: Diagnosis present

## 2019-03-28 DIAGNOSIS — Z7951 Long term (current) use of inhaled steroids: Secondary | ICD-10-CM

## 2019-03-28 DIAGNOSIS — Z993 Dependence on wheelchair: Secondary | ICD-10-CM

## 2019-03-28 DIAGNOSIS — R739 Hyperglycemia, unspecified: Secondary | ICD-10-CM | POA: Diagnosis present

## 2019-03-28 DIAGNOSIS — Z87891 Personal history of nicotine dependence: Secondary | ICD-10-CM

## 2019-03-28 DIAGNOSIS — E785 Hyperlipidemia, unspecified: Secondary | ICD-10-CM | POA: Diagnosis present

## 2019-03-28 DIAGNOSIS — B957 Other staphylococcus as the cause of diseases classified elsewhere: Secondary | ICD-10-CM | POA: Diagnosis not present

## 2019-03-28 DIAGNOSIS — Z96652 Presence of left artificial knee joint: Secondary | ICD-10-CM | POA: Diagnosis present

## 2019-03-28 DIAGNOSIS — F0390 Unspecified dementia without behavioral disturbance: Secondary | ICD-10-CM | POA: Diagnosis present

## 2019-03-28 DIAGNOSIS — B9620 Unspecified Escherichia coli [E. coli] as the cause of diseases classified elsewhere: Secondary | ICD-10-CM | POA: Diagnosis present

## 2019-03-28 DIAGNOSIS — J440 Chronic obstructive pulmonary disease with acute lower respiratory infection: Secondary | ICD-10-CM | POA: Diagnosis not present

## 2019-03-28 DIAGNOSIS — I5043 Acute on chronic combined systolic (congestive) and diastolic (congestive) heart failure: Secondary | ICD-10-CM | POA: Diagnosis present

## 2019-03-28 DIAGNOSIS — N390 Urinary tract infection, site not specified: Secondary | ICD-10-CM | POA: Diagnosis present

## 2019-03-28 DIAGNOSIS — I130 Hypertensive heart and chronic kidney disease with heart failure and stage 1 through stage 4 chronic kidney disease, or unspecified chronic kidney disease: Secondary | ICD-10-CM | POA: Diagnosis present

## 2019-03-28 DIAGNOSIS — I252 Old myocardial infarction: Secondary | ICD-10-CM | POA: Diagnosis not present

## 2019-03-28 DIAGNOSIS — Z66 Do not resuscitate: Secondary | ICD-10-CM | POA: Diagnosis not present

## 2019-03-28 DIAGNOSIS — J9621 Acute and chronic respiratory failure with hypoxia: Principal | ICD-10-CM | POA: Diagnosis present

## 2019-03-28 DIAGNOSIS — Z7982 Long term (current) use of aspirin: Secondary | ICD-10-CM

## 2019-03-28 DIAGNOSIS — Z7189 Other specified counseling: Secondary | ICD-10-CM | POA: Diagnosis not present

## 2019-03-28 DIAGNOSIS — L8990 Pressure ulcer of unspecified site, unspecified stage: Secondary | ICD-10-CM | POA: Insufficient documentation

## 2019-03-28 DIAGNOSIS — Z515 Encounter for palliative care: Secondary | ICD-10-CM | POA: Diagnosis not present

## 2019-03-28 LAB — BLOOD CULTURE ID PANEL (REFLEXED)

## 2019-03-28 LAB — BLOOD GAS, ARTERIAL
Acid-Base Excess: 10.3 mmol/L — ABNORMAL HIGH (ref 0.0–2.0)
Bicarbonate: 35.1 mmol/L — ABNORMAL HIGH (ref 20.0–28.0)
FIO2: 0.4
MECHVT: 550 mL
Mechanical Rate: 26
O2 Saturation: 98.4 %
PEEP: 5 cmH2O
Patient temperature: 37
pCO2 arterial: 46 mmHg (ref 32.0–48.0)
pH, Arterial: 7.49 — ABNORMAL HIGH (ref 7.350–7.450)
pO2, Arterial: 103 mmHg (ref 83.0–108.0)

## 2019-03-28 LAB — CBC WITH DIFFERENTIAL/PLATELET
Abs Immature Granulocytes: 0.22 10*3/uL — ABNORMAL HIGH (ref 0.00–0.07)
Basophils Absolute: 0.1 10*3/uL (ref 0.0–0.1)
Basophils Relative: 1 %
Eosinophils Absolute: 0.2 10*3/uL (ref 0.0–0.5)
Eosinophils Relative: 2 %
HCT: 36.9 % — ABNORMAL LOW (ref 39.0–52.0)
Hemoglobin: 10.9 g/dL — ABNORMAL LOW (ref 13.0–17.0)
Immature Granulocytes: 2 %
Lymphocytes Relative: 6 %
Lymphs Abs: 0.6 10*3/uL — ABNORMAL LOW (ref 0.7–4.0)
MCH: 30.6 pg (ref 26.0–34.0)
MCHC: 29.5 g/dL — ABNORMAL LOW (ref 30.0–36.0)
MCV: 103.7 fL — ABNORMAL HIGH (ref 80.0–100.0)
Monocytes Absolute: 0.5 10*3/uL (ref 0.1–1.0)
Monocytes Relative: 5 %
Neutro Abs: 9.3 10*3/uL — ABNORMAL HIGH (ref 1.7–7.7)
Neutrophils Relative %: 84 %
Platelets: 188 10*3/uL (ref 150–400)
RBC: 3.56 MIL/uL — ABNORMAL LOW (ref 4.22–5.81)
RDW: 17.2 % — ABNORMAL HIGH (ref 11.5–15.5)
WBC: 10.9 10*3/uL — ABNORMAL HIGH (ref 4.0–10.5)
nRBC: 0 % (ref 0.0–0.2)

## 2019-03-28 LAB — COMPREHENSIVE METABOLIC PANEL
ALT: 28 U/L (ref 0–44)
AST: 49 U/L — ABNORMAL HIGH (ref 15–41)
Albumin: 2.5 g/dL — ABNORMAL LOW (ref 3.5–5.0)
Alkaline Phosphatase: 148 U/L — ABNORMAL HIGH (ref 38–126)
Anion gap: 11 (ref 5–15)
BUN: 34 mg/dL — ABNORMAL HIGH (ref 8–23)
CO2: 31 mmol/L (ref 22–32)
Calcium: 8.4 mg/dL — ABNORMAL LOW (ref 8.9–10.3)
Chloride: 101 mmol/L (ref 98–111)
Creatinine, Ser: 1.14 mg/dL (ref 0.61–1.24)
GFR calc Af Amer: >60 mL/min (ref >60)
GFR calc non Af Amer: 60 mL/min — ABNORMAL LOW (ref >60)
Glucose, Bld: 188 mg/dL — ABNORMAL HIGH (ref 70–99)
Potassium: 4.8 mmol/L (ref 3.5–5.1)
Sodium: 143 mmol/L (ref 135–145)
Total Bilirubin: 0.8 mg/dL (ref 0.3–1.2)
Total Protein: 6.3 g/dL — ABNORMAL LOW (ref 6.5–8.1)

## 2019-03-28 LAB — URINALYSIS, ROUTINE W REFLEX MICROSCOPIC
Bilirubin Urine: NEGATIVE
Glucose, UA: NEGATIVE mg/dL
Ketones, ur: NEGATIVE mg/dL
Nitrite: NEGATIVE
Protein, ur: 30 mg/dL — AB
RBC / HPF: >50 RBC/hpf — ABNORMAL HIGH (ref 0–5)
Specific Gravity, Urine: 1.013 (ref 1.005–1.030)
Squamous Epithelial / HPF: NONE SEEN (ref 0–5)
WBC, UA: >50 WBC/hpf — ABNORMAL HIGH (ref 0–5)
pH: 5 (ref 5.0–8.0)

## 2019-03-28 LAB — PROTIME-INR
INR: 1.1 (ref 0.8–1.2)
Prothrombin Time: 14.2 seconds (ref 11.4–15.2)

## 2019-03-28 LAB — APTT: aPTT: 30 seconds (ref 24–36)

## 2019-03-28 LAB — TROPONIN I (HIGH SENSITIVITY): Troponin I (High Sensitivity): 229 ng/L (ref <18)

## 2019-03-28 LAB — GLUCOSE, CAPILLARY
Glucose-Capillary: 90 mg/dL (ref 70–99)
Glucose-Capillary: 73 mg/dL (ref 70–99)
Glucose-Capillary: 92 mg/dL (ref 70–99)

## 2019-03-28 LAB — LACTIC ACID, PLASMA
Lactic Acid, Venous: 4 mmol/L (ref 0.5–1.9)
Lactic Acid, Venous: 2.6 mmol/L (ref 0.5–1.9)

## 2019-03-28 LAB — SARS CORONAVIRUS 2 BY RT PCR (HOSPITAL ORDER, PERFORMED IN ~~LOC~~ HOSPITAL LAB): SARS Coronavirus 2: NEGATIVE

## 2019-03-28 LAB — MRSA PCR SCREENING: MRSA by PCR: POSITIVE — AB

## 2019-03-28 LAB — TSH: TSH: 49.284 u[IU]/mL — ABNORMAL HIGH (ref 0.350–4.500)

## 2019-03-28 LAB — BRAIN NATRIURETIC PEPTIDE: B Natriuretic Peptide: 782 pg/mL — ABNORMAL HIGH (ref 0.0–100.0)

## 2019-03-28 LAB — PROCALCITONIN: Procalcitonin: <0.1 ng/mL

## 2019-03-28 MED ORDER — PROPOFOL 1000 MG/100ML IV EMUL
5.0000 ug/kg/min | INTRAVENOUS | Status: DC
  Administered 2019-03-28: 02:00:00 5 ug/kg/min via INTRAVENOUS
  Administered 2019-03-28: 10 ug/kg/min via INTRAVENOUS

## 2019-03-28 MED ORDER — ONDANSETRON HCL 4 MG/2ML IJ SOLN: 4.0000 mg | Freq: Four times a day (QID) | INTRAMUSCULAR | Status: DC | PRN

## 2019-03-28 MED ORDER — ENOXAPARIN SODIUM 40 MG/0.4ML ~~LOC~~ SOLN
40.0000 mg | Freq: Two times a day (BID) | SUBCUTANEOUS | Status: DC
  Administered 2019-03-28 – 2019-03-30 (×5): 40 mg via SUBCUTANEOUS
  Filled 2019-03-28 (×5): qty 0.4

## 2019-03-28 MED ORDER — ORAL CARE MOUTH RINSE
15.0000 mL | OROMUCOSAL | Status: DC
  Administered 2019-03-28 – 2019-03-29 (×15): 15 mL via OROMUCOSAL

## 2019-03-28 MED ORDER — MIDAZOLAM HCL 2 MG/2ML IJ SOLN
1.0000 mg | INTRAMUSCULAR | Status: DC | PRN
  Administered 2019-03-29: 2 mg via INTRAVENOUS
  Filled 2019-03-28 (×2): qty 2

## 2019-03-28 MED ORDER — SODIUM CHLORIDE 0.9 % IV SOLN
500.0000 mg | INTRAVENOUS | Status: DC
  Administered 2019-03-28: 500 mg via INTRAVENOUS
  Filled 2019-03-28 (×2): qty 500

## 2019-03-28 MED ORDER — FUROSEMIDE 10 MG/ML IJ SOLN
20.0000 mg | Freq: Two times a day (BID) | INTRAMUSCULAR | Status: DC
  Administered 2019-03-28 – 2019-03-29 (×3): 20 mg via INTRAVENOUS
  Filled 2019-03-28 (×3): qty 2

## 2019-03-28 MED ORDER — ACETAMINOPHEN 325 MG PO TABS: 650.0000 mg | ORAL_TABLET | Freq: Four times a day (QID) | ORAL | Status: DC | PRN

## 2019-03-28 MED ORDER — FENTANYL 2500MCG IN NS 250ML (10MCG/ML) PREMIX INFUSION
25.0000 ug/h | INTRAVENOUS | Status: DC
  Administered 2019-03-28: 25 ug/h via INTRAVENOUS
  Administered 2019-03-29: 150 ug/h via INTRAVENOUS
  Filled 2019-03-28 (×2): qty 250

## 2019-03-28 MED ORDER — SODIUM CHLORIDE 0.9 % IV SOLN
INTRAVENOUS | Status: DC | PRN
  Administered 2019-03-28 – 2019-03-31 (×2): 250 mL via INTRAVENOUS

## 2019-03-28 MED ORDER — MUPIROCIN 2 % EX OINT
TOPICAL_OINTMENT | Freq: Two times a day (BID) | CUTANEOUS | Status: DC
  Administered 2019-03-28 – 2019-03-29 (×4): via NASAL
  Administered 2019-03-30: 1 via NASAL
  Administered 2019-03-30 – 2019-04-02 (×5): via NASAL
  Filled 2019-03-28: qty 22

## 2019-03-28 MED ORDER — SODIUM CHLORIDE 0.9 % IV BOLUS
1000.0000 mL | Freq: Once | INTRAVENOUS | Status: AC
  Administered 2019-03-28: 1000 mL via INTRAVENOUS

## 2019-03-28 MED ORDER — PROPOFOL 1000 MG/100ML IV EMUL
INTRAVENOUS | Status: AC
  Administered 2019-03-28: 5 ug/kg/min via INTRAVENOUS
  Filled 2019-03-28: qty 100

## 2019-03-28 MED ORDER — NOREPINEPHRINE 4 MG/250ML-% IV SOLN
0.0000 ug/min | INTRAVENOUS | Status: DC
  Administered 2019-03-28: 10 ug/min via INTRAVENOUS

## 2019-03-28 MED ORDER — CHLORHEXIDINE GLUCONATE 0.12% ORAL RINSE (MEDLINE KIT)
15.0000 mL | Freq: Two times a day (BID) | OROMUCOSAL | Status: DC
  Administered 2019-03-28 – 2019-03-29 (×3): 15 mL via OROMUCOSAL

## 2019-03-28 MED ORDER — IPRATROPIUM-ALBUTEROL 0.5-2.5 (3) MG/3ML IN SOLN
3.0000 mL | RESPIRATORY_TRACT | Status: DC
  Administered 2019-03-28 – 2019-03-29 (×7): 3 mL via RESPIRATORY_TRACT
  Filled 2019-03-28 (×7): qty 3

## 2019-03-28 MED ORDER — FENTANYL CITRATE (PF) 100 MCG/2ML IJ SOLN: 25.0000 ug | Freq: Once | INTRAMUSCULAR | Status: DC

## 2019-03-28 MED ORDER — DOCUSATE SODIUM 50 MG/5ML PO LIQD
100.0000 mg | Freq: Two times a day (BID) | ORAL | Status: DC
  Administered 2019-03-28 – 2019-03-29 (×3): 100 mg via ORAL
  Filled 2019-03-28 (×3): qty 10

## 2019-03-28 MED ORDER — ACETAMINOPHEN 650 MG RE SUPP: 650.0000 mg | Freq: Four times a day (QID) | RECTAL | Status: DC | PRN

## 2019-03-28 MED ORDER — FENTANYL BOLUS VIA INFUSION
25.0000 ug | INTRAVENOUS | Status: DC | PRN
  Administered 2019-03-28 – 2019-03-29 (×6): 25 ug via INTRAVENOUS
  Filled 2019-03-28: qty 25

## 2019-03-28 MED ORDER — MIDAZOLAM HCL 2 MG/2ML IJ SOLN
1.0000 mg | INTRAMUSCULAR | Status: DC | PRN
  Administered 2019-03-28 (×2): 2 mg via INTRAVENOUS
  Filled 2019-03-28 (×2): qty 2

## 2019-03-28 MED ORDER — NOREPINEPHRINE 4 MG/250ML-% IV SOLN
INTRAVENOUS | Status: AC
  Administered 2019-03-28: 04:00:00 10 ug/min via INTRAVENOUS
  Filled 2019-03-28: qty 250

## 2019-03-28 MED ORDER — VANCOMYCIN HCL 10 G IV SOLR
1500.0000 mg | INTRAVENOUS | Status: DC
  Filled 2019-03-28: qty 1500

## 2019-03-28 MED ORDER — ROCURONIUM BROMIDE 50 MG/5ML IV SOLN
INTRAVENOUS | Status: AC | PRN
  Administered 2019-03-28: 10 mg via INTRAVENOUS

## 2019-03-28 MED ORDER — PANTOPRAZOLE SODIUM 40 MG IV SOLR
40.0000 mg | INTRAVENOUS | Status: DC
  Administered 2019-03-28 – 2019-03-29 (×2): 40 mg via INTRAVENOUS
  Filled 2019-03-28 (×2): qty 40

## 2019-03-28 MED ORDER — ONDANSETRON HCL 4 MG PO TABS: 4.0000 mg | ORAL_TABLET | Freq: Four times a day (QID) | ORAL | Status: DC | PRN

## 2019-03-28 MED ORDER — VANCOMYCIN HCL 10 G IV SOLR
2500.0000 mg | Freq: Once | INTRAVENOUS | Status: AC
  Administered 2019-03-28: 2500 mg via INTRAVENOUS
  Filled 2019-03-28: qty 2500

## 2019-03-28 MED ORDER — SODIUM CHLORIDE 0.9 % IV SOLN
2.0000 g | INTRAVENOUS | Status: DC
  Administered 2019-03-28: 2 g via INTRAVENOUS
  Filled 2019-03-28 (×2): qty 20

## 2019-03-28 NOTE — Progress Notes (Signed)
[facility]                                                                                                                                                                                  Patient Demographics   Dylan [name], is a 83 y.o. male, DOB - 10-26-1935, YFV:[id number]  Admit date - 03/28/2019   Admitting Physician Harrie Foreman, MD  Outpatient Primary MD for the patient is Albina Billet, MD   LOS - 0  Subjective: Patient remains vent on the ventilator and sedated    Review of Systems:   CONSTITUTIONAL: Unable to provide due to sedation  Vitals:   Vitals:   03/28/19 1215 03/28/19 1300 03/28/19 1330 03/28/19 1400  BP: (!) 107/56 (!) 106/55 (!) 122/54 (!) 113/56  Pulse: (!) 50 (!) 50 (!) 25 (!) 53  Resp: 20 (!) 0 11 (!) 0  Temp: 98.6 F (37 C)     TempSrc: Oral     SpO2: 99% 98% (!) 88% 96%  Weight:      Height:        Wt Readings from Last 3 Encounters:  03/28/19 129.3 kg  02/21/19 130 kg  02/03/19 120.7 kg     Intake/Output Summary (Last 24 hours) at 03/28/2019 1428 Last data filed at 03/28/2019 1400 Gross per 24 hour  Intake 525.59 ml  Output 600 ml  Net -74.41 ml    Physical Exam:   GENERAL: Critically ill on the ventilator HEAD, EYES, EARS, NOSE AND THROAT: Atraumatic, normocephalic. Extraocular muscles are intact. Pupils equal and reactive to light. Sclerae anicteric. No conjunctival injection. No oro-pharyngeal erythema.  NECK: Supple. There is no jugular venous distention. No bruits, no lymphadenopathy, no thyromegaly.  HEART: Regular rate and rhythm,. No murmurs, no rubs, no clicks.  LUNGS: Clear to auscultation bilaterally. No rales or rhonchi. No wheezes.  ABDOMEN: Soft, flat, nontender, nondistended. Has good bowel sounds. No hepatosplenomegaly appreciated.  EXTREMITIES: No evidence of any cyanosis, clubbing, or peripheral edema.  +2 pedal and radial pulses bilaterally.  NEUROLOGIC: sedated SKIN:  Moist and warm with no rashes appreciated.  Psych: Sedated LN: No inguinal LN enlargement    Antibiotics   Anti-infectives (From admission, onward)   Start     Dose/Rate Route Frequency Ordered Stop   03/28/19 0200  cefTRIAXone (ROCEPHIN) 2 g in sodium chloride 0.9 % 100 mL IVPB     2 g 200 mL/hr over 30 Minutes Intravenous Every 24 hours 03/28/19 0159     03/28/19 0200  azithromycin (ZITHROMAX) 500 mg in sodium chloride 0.9 % 250 mL IVPB     500 mg 250 mL/hr over  60 Minutes Intravenous Every 24 hours 03/28/19 0159        Medications   Scheduled Meds: . chlorhexidine gluconate (MEDLINE KIT)  15 mL Mouth Rinse BID  . docusate  100 mg Oral BID  . enoxaparin (LOVENOX) injection  40 mg Subcutaneous Q12H  . fentaNYL (SUBLIMAZE) injection  25 mcg Intravenous Once  . furosemide  20 mg Intravenous Q12H  . ipratropium-albuterol  3 mL Nebulization Q4H  . mouth rinse  15 mL Mouth Rinse 10 times per day  . mupirocin ointment   Nasal BID  . pantoprazole (PROTONIX) IV  40 mg Intravenous Q24H   Continuous Infusions: . azithromycin Stopped (03/28/19 0455)  . cefTRIAXone (ROCEPHIN)  IV Stopped (03/28/19 0245)  . fentaNYL infusion INTRAVENOUS 100 mcg/hr (03/28/19 1400)  . norepinephrine (LEVOPHED) Adult infusion Stopped (03/28/19 0720)   PRN Meds:.acetaminophen **OR** acetaminophen, fentaNYL, midazolam, midazolam, ondansetron **OR** ondansetron (ZOFRAN) IV   Data Review:   Micro Results Recent Results (from the past 240 hour(s))  Blood Culture (routine x 2)     Status: None (Preliminary result)   Collection Time: 03/28/19  2:19 AM   Specimen: BLOOD  Result Value Ref Range Status   Specimen Description BLOOD RIGHT ANTECUBITAL  Final   Special Requests   Final    BOTTLES DRAWN AEROBIC AND ANAEROBIC Blood Culture adequate volume   Culture   Final    NO GROWTH < 12 HOURS Performed at [facility], [address] [place], [place] [address]    Report Status PENDING   Incomplete  Blood Culture (routine x 2)     Status: None (Preliminary result)   Collection Time: 03/28/19  2:19 AM   Specimen: BLOOD  Result Value Ref Range Status   Specimen Description BLOOD RIGHT ARM  Final   Special Requests   Final    BOTTLES DRAWN AEROBIC AND ANAEROBIC Blood Culture results may not be optimal due to an excessive volume of blood received in culture bottles   Culture   Final    NO GROWTH < 12 HOURS Performed at [facility], [address] [place], [place] [address]    Report Status PENDING  Incomplete  SARS Coronavirus 2 (CEPHEID- Performed in [place] hospital lab), Hosp Order     Status: None   Collection Time: 03/28/19  2:19 AM   Specimen: Urine, Unspecified Source; Nasopharyngeal  Result Value Ref Range Status   SARS Coronavirus 2 NEGATIVE NEGATIVE Final    Comment: (NOTE) If result is NEGATIVE SARS-CoV-2 target nucleic acids are NOT DETECTED. The SARS-CoV-2 RNA is generally detectable in upper and lower  respiratory specimens during the acute phase of infection. The lowest  concentration of SARS-CoV-2 viral copies this assay can detect is 250  copies / mL. A negative result does not preclude SARS-CoV-2 infection  and should not be used as the sole basis for treatment or other  patient management decisions.  A negative result may occur with  improper specimen collection / handling, submission of specimen other  than nasopharyngeal swab, presence of viral mutation(s) within the  areas targeted by this assay, and inadequate number of viral copies  (<250 copies / mL). A negative result must be combined with clinical  observations, patient history, and epidemiological information. If result is POSITIVE SARS-CoV-2 target nucleic acids are DETECTED. The SARS-CoV-2 RNA is generally detectable in upper and lower  respiratory specimens dur ing the acute phase of infection.  Positive  results are indicative of active infection with  SARS-CoV-2.   Clinical  correlation with patient history and other diagnostic information is  necessary to determine patient infection status.  Positive results do  not rule out bacterial infection or co-infection with other viruses. If result is PRESUMPTIVE POSTIVE SARS-CoV-2 nucleic acids MAY BE PRESENT.   A presumptive positive result was obtained on the submitted specimen  and confirmed on repeat testing.  While 2019 novel coronavirus  (SARS-CoV-2) nucleic acids may be present in the submitted sample  additional confirmatory testing may be necessary for epidemiological  and / or clinical management purposes  to differentiate between  SARS-CoV-2 and other Sarbecovirus currently known to infect humans.  If clinically indicated additional testing with an alternate test  methodology [phone]) is advised. The SARS-CoV-2 RNA is generally  detectable in upper and lower respiratory sp ecimens during the acute  phase of infection. The expected result is Negative. Fact Sheet for Patients:  StrictlyIdeas.no Fact Sheet for Healthcare Providers: BankingDealers.co.za This test is not yet approved or cleared by the [place] FDA and has been authorized for detection and/or diagnosis of SARS-CoV-2 by FDA under an Emergency Use Authorization (EUA).  This EUA will remain in effect (meaning this test can be used) for the duration of the COVID-19 declaration under Section 564(b)(1) of the Act, 21 U.S.C. section 360bbb-3(b)(1), unless the authorization is terminated or revoked sooner. Performed at [facility], [address] [place], [place] [address]   MRSA PCR Screening     Status: Abnormal   Collection Time: 03/28/19  5:47 AM   Specimen: Nasal Mucosa; Nasopharyngeal  Result Value Ref Range Status   MRSA by PCR POSITIVE (A) NEGATIVE Final    Comment:        The GeneXpert MRSA Assay (FDA approved for NASAL specimens only), is one component of  a comprehensive MRSA colonization surveillance program. It is not intended to diagnose MRSA infection nor to guide or monitor treatment for MRSA infections. RESULT CALLED TO, READ BACK BY AND VERIFIED WITH: KAYLA FOSTER '@[id number]'  ON 03/28/2019 BY FMW Performed at [facility], [address] [place], [place] [address]     Radiology Reports Dg Chest [place] 1 View  Result Date: 03/28/2019 CLINICAL DATA:  Intubation EXAM: PORTABLE CHEST 1 VIEW COMPARISON:  02/23/2019 FINDINGS: The endotracheal tube tip just below the level of clavicular heads. Orogastric tube side port projects over the gastric body. Patient is markedly rotated to the left, obscuring most of the left hemithorax. Visualized right lung is clear. IMPRESSION: Radiographically appropriate position of endotracheal 2. Electronically Signed   By: Ulyses Jarred M.D.   On: 03/28/2019 02:34     CBC Recent Labs  Lab 03/28/19 0219  WBC 10.9*  HGB 10.9*  HCT 36.9*  PLT 188  MCV 103.7*  MCH 30.6  MCHC 29.5*  RDW 17.2*  LYMPHSABS 0.6*  MONOABS 0.5  EOSABS 0.2  BASOSABS 0.1    Chemistries  Recent Labs  Lab 03/28/19 0219  NA 143  K 4.8  CL 101  CO2 31  GLUCOSE 188*  BUN 34*  CREATININE 1.14  CALCIUM 8.4*  AST 49*  ALT 28  ALKPHOS 148*  BILITOT 0.8   ------------------------------------------------------------------------------------------------------------------ estimated creatinine clearance is 69.7 mL/min (by C-G formula based on SCr of 1.14 mg/dL). ------------------------------------------------------------------------------------------------------------------ No results for input(s): HGBA1C in the last 72 hours. ------------------------------------------------------------------------------------------------------------------ No results for input(s): CHOL, HDL, LDLCALC, TRIG, CHOLHDL, LDLDIRECT in the last 72  hours. ------------------------------------------------------------------------------------------------------------------ Recent Labs    03/28/19 0219  TSH 49.284*   ------------------------------------------------------------------------------------------------------------------  No results for input(s): VITAMINB12, FOLATE, FERRITIN, TIBC, IRON, RETICCTPCT in the last 72 hours.  Coagulation profile Recent Labs  Lab 03/28/19 0219  INR 1.1    No results for input(s): DDIMER in the last 72 hours.  Cardiac Enzymes No results for input(s): CKMB, TROPONINI, MYOGLOBIN in the last 168 hours.  Invalid input(s): CK ------------------------------------------------------------------------------------------------------------------ Invalid input(s): [place]   This is an 83 year old male admitted for respiratory failure. 1.  Respiratory failure: Acute on chronic; with hypoxia.  Continue mechanical ventilation.  COVID-19 negative.  Continue antibiotics.      Continue mechanical ventilation 2.  COPD: End-stage; schedule albuterol treatments.  3.  CAD: Troponin is elevated likely due to demand ischemia  4.  Hypertension: Hold antihypertensive agents at this time. Patient is currently hypotensive.   5.  DVT prophylaxis: Lovenox 6.  GI prophylaxis: PPI after 24 hours intubation      Code Status Orders  (From admission, onward)         Start     Ordered   03/28/19 0517  Full code  Continuous     03/28/19 0516        Code Status History    Date Active Date Inactive Code Status Order ID Comments User Context   02/21/2019 1125 02/24/2019 2310 Full Code [id number]  Loletha Grayer, MD ED   01/30/2019 1805 02/03/2019 1754 Full Code [id number]  Otila Back, MD ED   11/23/2018 0619 12/01/2018 1541 Full Code [id number]  Harrie Foreman, MD Inpatient   11/14/2018 0314 11/19/2018 1822 Full Code [id number]  Mansy, Arvella Merles, MD ED   10/27/2018 2101 10/31/2018 2110 Partial Code [id number]   Vaughan Basta, MD Inpatient   08/26/2018 1137 09/04/2018 1912 Full Code [id number]  Hessie Knows, MD ED   06/21/2018 1321 06/24/2018 2008 Partial Code [id number]  Hillary Bow, MD Inpatient   06/21/2018 0803 06/21/2018 1321 Full Code [id number]  Wellington Hampshire, MD Inpatient   06/01/2018 2009 06/04/2018 1718 Full Code [id number]  Dereck Leep, MD Inpatient   01/11/2018 0012 01/14/2018 1908 Full Code [id number]  Amelia Jo, MD Inpatient   12/01/2017 1924 12/05/2017 1948 Full Code [id number]  Vaughan Basta, MD Inpatient   08/31/2017 0557 09/05/2017 1959 Full Code [id number]  Harrie Foreman, MD Inpatient   06/10/2016 1934 06/13/2016 1816 Full Code [id number]  Demetrios Loll, MD Inpatient   12/15/2015 0134 12/16/2015 1507 Full Code [id number]  Saundra Shelling, MD ED   Advance Care Planning Activity           Consults pulmonary critical care  DVT Prophylaxis Lovenox Lab Results  Component Value Date   PLT 188 03/28/2019     Time Spent in minutes  45mn critical care spent Greater than 50% of time spent in care coordination and counseling patient regarding the condition and plan of care.   SDustin FlockM.D on 03/28/2019 at 2:28 PM  Between 7am to 6pm - Pager - [phone]  After 6pm go to [email or web address] - [email or web address] [employer]   Office  [phone]

## 2019-03-28 NOTE — Progress Notes (Signed)
Maggie, NP notified of SB down to 42 not sustained, also junctional beats noted. See new order.

## 2019-03-28 NOTE — Progress Notes (Signed)
PHARMACY - PHYSICIAN COMMUNICATION CRITICAL VALUE ALERT - BLOOD CULTURE IDENTIFICATION (BCID)  Dylan [name] is an 83 y.o. male who presented to [facility] on 03/28/2019 with a chief complaint of respiratory failure with a PMH significant for COPD. Patient currently has active orders for CTX and azithromycin.   Assessment:  Positive blood culture for gram positive cocci; staphylococcus species and methicillin resistance detected in 2/4 bottles (anaerobic and aerobic).   Name of physician (or Provider) Contacted: Marda Stalker, NP  Current antibiotics: Azithromycin and CTX  Changes to prescribed antibiotics recommended: Start Vancomycin and d/c azithromycin and CTX.    Results for orders placed or performed during the hospital encounter of 03/28/19  Blood Culture ID Panel (Reflexed) (Collected: 03/28/2019  2:19 AM)  Result Value Ref Range   Enterococcus species NOT DETECTED NOT DETECTED   Listeria monocytogenes NOT DETECTED NOT DETECTED   Staphylococcus species DETECTED (A) NOT DETECTED   Staphylococcus aureus (BCID) NOT DETECTED NOT DETECTED   Methicillin resistance DETECTED (A) NOT DETECTED   Streptococcus species NOT DETECTED NOT DETECTED   Streptococcus agalactiae NOT DETECTED NOT DETECTED   Streptococcus pneumoniae NOT DETECTED NOT DETECTED   Streptococcus pyogenes NOT DETECTED NOT DETECTED   Acinetobacter baumannii NOT DETECTED NOT DETECTED   Enterobacteriaceae species NOT DETECTED NOT DETECTED   Enterobacter cloacae complex NOT DETECTED NOT DETECTED   Escherichia coli NOT DETECTED NOT DETECTED   Klebsiella oxytoca NOT DETECTED NOT DETECTED   Klebsiella pneumoniae NOT DETECTED NOT DETECTED   Proteus species NOT DETECTED NOT DETECTED   Serratia marcescens NOT DETECTED NOT DETECTED   Haemophilus influenzae NOT DETECTED NOT DETECTED   Neisseria meningitidis NOT DETECTED NOT DETECTED   Pseudomonas aeruginosa NOT DETECTED NOT DETECTED   Candida albicans NOT DETECTED NOT DETECTED    Candida glabrata NOT DETECTED NOT DETECTED   Candida krusei NOT DETECTED NOT DETECTED   Candida parapsilosis NOT DETECTED NOT DETECTED   Candida tropicalis NOT DETECTED NOT DETECTED    Rowland Lathe 03/28/2019  9:20 PM

## 2019-03-28 NOTE — Progress Notes (Signed)
PHARMACIST - PHYSICIAN COMMUNICATION  CONCERNING:  Enoxaparin (Lovenox) for DVT Prophylaxis   RECOMMENDATION: Patient was prescribed enoxaprin 40mg  q24 hours for VTE prophylaxis.   Filed Weights   03/28/19 0153  Weight: 300 lb (136.1 kg)    Body mass index is 41.84 kg/m.  Estimated Creatinine Clearance: 71.6 mL/min (by C-G formula based on SCr of 1.14 mg/dL).  Based on [place] patient is candidate for enoxaparin 40mg  every 12 hour dosing due to BMI being >40.  DESCRIPTION: Pharmacy has adjusted enoxaparin dose per [facility] policy.  Patient is now receiving enoxaparin 40mg  every 12 hours.   Pernell Dupre, PharmD, BCPS Clinical Pharmacist 03/28/2019 5:26 AM

## 2019-03-28 NOTE — Progress Notes (Signed)
Initial Nutrition Assessment  RD working remotely.  DOCUMENTATION CODES:   Obesity unspecified  INTERVENTION:  Pending PMT consult to discuss goals of care. If patient expected to remain intubated >24-48 hours recommend initiating enteral nutrition.  If tube feeds are initiated recommend Vital High Protein at 50 mL/hr (1200 mL goal daily volume) + Pro-Stat 60 mL BID per tube. Provides 1600 kcal, 165 grams of protein, 1008 mL H2O daily.  If tube feeds are initiated recommend minimum free water flush of 30 mL Q4hrs to maintain tube patency and liquid MVI daily per tube to meet 100% RDIs for vitamins/minerals.  NUTRITION DIAGNOSIS:   Inadequate oral intake related to inability to eat as evidenced by NPO status.  GOAL:   Provide needs based on ASPEN/SCCM guidelines  MONITOR:   Vent status, Labs, Weight trends, TF tolerance, Skin, I & O's  REASON FOR ASSESSMENT:   Ventilator    ASSESSMENT:   83 year old male with PMHx of HTN, HLD, CAD, CHF, hx MI, COPD, sleep apnea admitted with acute hypoxic respiratory failure due to a combination of CHF exacerbation and COPD exacerbation requiring intubation on 7/26.   Patient intubated and sedated. On PRVC mode with FiO2 24% and PEEP 5 cmH2O. Abdomen soft per RN documentation. Pending PMT consult to discuss goals of care. Per chart patient was being followed by palliative and plan was to transition to hospice care. He did not want to return to hospital.  Enteral Access: 16 Fr. OGT placed 7/26; terminates over gastric body per chest x-ray 7/26; 58 cm at corner of mouth  MAP: 71-110 mmHg  Patient is currently intubated on ventilator support Ve: 11 L/min Temp (24hrs), Avg:97.5 F (36.4 C), Min:96.7 F (35.9 C), Max:98.3 F (36.8 C)  Propofol: N/A  Medications reviewed and include: Colace 100 mg BID, Lasix 20 mg Q12hrs IV, pantoprazole, azithromycin, ceftriaxone, fentanyl gtt, norepinephrine gtt now off.  Labs reviewed: BUN 34, BNP  782.  I/O: 450 mL UOP overnight  NUTRITION - FOCUSED PHYSICAL EXAM:  Unable to complete at this time.  Diet Order:   Diet Order            Diet NPO time specified Except for: Sips with Meds  Diet effective now             EDUCATION NEEDS:   No education needs have been identified at this time  Skin:  Skin Assessment: Skin Integrity Issues:(stage II buttocks)  Last BM:  Unknown/PTA  Height:   Ht Readings from Last 1 Encounters:  03/28/19 5\' 11"  (1.803 m)   Weight:   Wt Readings from Last 1 Encounters:  03/28/19 129.3 kg   Ideal Body Weight:  78.2 kg  BMI:  Body mass index is 39.76 kg/m.  Estimated Nutritional Needs:   Kcal:  1422-1810 (11-14 kcal/kg)  Protein:  156 grams (2 grams/kg IBW)  Fluid:  per MD  Willey Blade, MS, RD, LDN Office: [phone] Pager: [phone] After Hours/Weekend Pager: [phone]

## 2019-03-28 NOTE — ED Notes (Signed)
ED TO INPATIENT HANDOFF REPORT  ED Nurse Name and Phone #: Chelly Dombeck [phone]  S Name/Age/Gender Dylan ClintonJerry B Deangelo 83 y.o. male Room/Bed: EDOTFA/EDOTF  Code Status   Code Status: Prior  Home/SNF/Other Home Patient oriented to --- unresponsive Is this baseline? unk baseline  Triage Complete: Triage complete  Chief Complaint Breathing difficulty  Triage Note Patient presents to Emergency Department via [facility] from HOME with complaints of unresponsive     Hospice  EMS reports found at 40% RA     Allergies No Known Allergies  Level of Care/Admitting Diagnosis ED Disposition    ED Disposition Condition Comment   Admit  [facility] [[id number]]  Level of Care: ICU [6]  Covid Evaluation: Confirmed COVID Negative  Diagnosis: Acute on chronic respiratory failure with hypoxemia [facility](HCC) [[id number]]) [[id number]]  Admitting Physician: Arnaldo NatalIAMOND, MICHAEL S [[id number]]  Attending Physician: Arnaldo NatalDIAMOND, MICHAEL S [[id number]]  Estimated length of stay: past midnight tomorrow  Certification:: I certify this patient will need inpatient services for at least 2 midnights  PT Class (Do Not Modify): Inpatient [101]  PT Acc Code (Do Not Modify): Private [1]       B Medical/Surgery History Past Medical History:  Diagnosis Date  . CAD (coronary artery disease)   . Cervicalgia   . CHF (congestive heart failure) (HCC)   . COPD (chronic obstructive pulmonary disease) (HCC)   . Diastolic heart failure (HCC)   . Foot drop, right   . History of kidney stones   . Hyperlipidemia    unspecified  . Hypertension   . Myocardial infarction (HCC)   . Osteoarthritis   . Shoulder pain, left   . Sleep apnea   . Tremor, essential    Past Surgical History:  Procedure Laterality Date  . APPLICATION OF WOUND VAC Right 11/13/2017   Procedure: APPLICATION OF WOUND VAC;  Surgeon: Annice Needyew, Jason S, MD;  Location: [facility] ORS;  Service: Vascular;  Laterality: Right;  . BACK SURGERY  06/2010  .  CARDIAC CATHETERIZATION Left 04/30/2016   Procedure: Left Heart Cath and Coronary Angiography;  Surgeon: Laurier NancyShaukat A Khan, MD;  Location: [facility] INVASIVE CV LAB;  Service: Cardiovascular;  Laterality: Left;  . COLONOSCOPY    . CORONARY ANGIOPLASTY    . CORONARY/GRAFT ACUTE MI REVASCULARIZATION N/A 06/21/2018   Procedure: Coronary/Graft Acute MI Revascularization;  Surgeon: Iran OuchArida, Muhammad A, MD;  Location: [facility] INVASIVE CV LAB;  Service: Cardiovascular;  Laterality: N/A;  . KNEE ARTHROPLASTY Left 06/01/2018   Procedure: COMPUTER ASSISTED TOTAL KNEE ARTHROPLASTY;  Surgeon: Donato HeinzHooten, James P, MD;  Location: [facility] ORS;  Service: Orthopedics;  Laterality: Left;  . KNEE ARTHROSCOPY Left   . KNEE SURGERY Left 1998  . LEFT HEART CATH AND CORONARY ANGIOGRAPHY N/A 06/21/2018   Procedure: LEFT HEART CATH AND CORONARY ANGIOGRAPHY;  Surgeon: Iran OuchArida, Muhammad A, MD;  Location: [facility] INVASIVE CV LAB;  Service: Cardiovascular;  Laterality: N/A;  . ORIF FEMUR FRACTURE Left 08/27/2018   Procedure: OPEN REDUCTION INTERNAL FIXATION (ORIF) SUPRACONDYLAR FEMUR FRACTURE ABOVE PROSTHESIS, QUADRICEPS REPAIR;  Surgeon: Kennedy BuckerMenz, Michael, MD;  Location: [facility] ORS;  Service: Orthopedics;  Laterality: Left;  . TEE WITHOUT CARDIOVERSION N/A 11/18/2018   Procedure: TRANSESOPHAGEAL ECHOCARDIOGRAM (TEE);  Surgeon: Lamar BlinksKowalski, Bruce J, MD;  Location: [facility] ORS;  Service: Cardiovascular;  Laterality: N/A;  . TONSILLECTOMY    . WOUND DEBRIDEMENT Right 11/13/2017   Procedure: DEBRIDEMENT WOUND;  Surgeon: Annice Needyew, Jason S, MD;  Location: [facility] ORS;  Service: Vascular;  Laterality: Right;  A IV Location/Drains/Wounds Patient Lines/Drains/Airways Status   Active Line/Drains/Airways    Name:   Placement date:   Placement time:   Site:   Days:   Peripheral IV 03/28/19 Right Arm   03/28/19    0158    Arm   less than 1   Peripheral IV 03/28/19 Right Forearm   03/28/19    0200    Forearm   less than 1   Peripheral IV 03/28/19 Right;Upper Antecubital    03/28/19    0340    Antecubital   less than 1   NG/OG Tube Orogastric 16 Fr. Left mouth Aucultation Documented cm marking at nare/ corner of mouth 62 cm   03/28/19    0210    Left mouth   less than 1   Urethral Catheter   03/28/19    0000    -   less than 1   Airway 7.5 mm   03/28/19    0149     less than 1   Wound / Incision (Open or Dehisced) 01/31/19 Other (Comment) Toe (Comment  which one) Right   01/31/19    1030    Toe (Comment  which one)   56          Intake/Output Last 24 hours  Intake/Output Summary (Last 24 hours) at 03/28/2019 0407 Last data filed at 03/28/2019 0245 Gross per 24 hour  Intake 100.38 ml  Output -  Net 100.38 ml    Labs/Imaging Results for orders placed or performed during the hospital encounter of 03/28/19 (from the past 48 hour(s))  Lactic acid, plasma     Status: Abnormal   Collection Time: 03/28/19  2:19 AM  Result Value Ref Range   Lactic Acid, Venous 4.0 (HH) 0.5 - 1.9 mmol/L    Comment: CRITICAL RESULT CALLED TO, READ BACK BY AND VERIFIED WITH REBECCA LYNN RN AT [phone] ON 03/28/2019 SNG Performed at [facility], [address] [place], [place] [address]   Comprehensive metabolic panel     Status: Abnormal   Collection Time: 03/28/19  2:19 AM  Result Value Ref Range   Sodium 143 135 - 145 mmol/L   Potassium 4.8 3.5 - 5.1 mmol/L    Comment: HEMOLYSIS AT THIS LEVEL MAY AFFECT RESULT   Chloride 101 98 - 111 mmol/L   CO2 31 22 - 32 mmol/L   Glucose, Bld 188 (H) 70 - 99 mg/dL   BUN 34 (H) 8 - 23 mg/dL   Creatinine, Ser 4.091.14 0.61 - 1.24 mg/dL   Calcium 8.4 (L) 8.9 - 10.3 mg/dL   Total Protein 6.3 (L) 6.5 - 8.1 g/dL   Albumin 2.5 (L) 3.5 - 5.0 g/dL   AST 49 (H) 15 - 41 U/L   ALT 28 0 - 44 U/L   Alkaline Phosphatase 148 (H) 38 - 126 U/L   Total Bilirubin 0.8 0.3 - 1.2 mg/dL   GFR calc non Af Amer 60 (L) >60 mL/min   GFR calc Af Amer >60 >60 mL/min   Anion gap 11 5 - 15    Comment: Performed at [facility], [address] [place], [place] [address]  CBC WITH DIFFERENTIAL     Status: Abnormal   Collection Time: 03/28/19  2:19 AM  Result Value Ref Range   WBC 10.9 (H) 4.0 - 10.5 K/uL   RBC 3.56 (L) 4.22 - 5.81 MIL/uL   Hemoglobin 10.9 (L) 13.0 - 17.0 g/dL   HCT 47.836.9 (L)  39.0 - 52.0 %   MCV 103.7 (H) 80.0 - 100.0 fL   MCH 30.6 26.0 - 34.0 pg   MCHC 29.5 (L) 30.0 - 36.0 g/dL   RDW 86.5 (H) 78.4 - 69.6 %   Platelets 188 150 - 400 K/uL   nRBC 0.0 0.0 - 0.2 %   Neutrophils Relative % 84 %   Neutro Abs 9.3 (H) 1.7 - 7.7 K/uL   Lymphocytes Relative 6 %   Lymphs Abs 0.6 (L) 0.7 - 4.0 K/uL   Monocytes Relative 5 %   Monocytes Absolute 0.5 0.1 - 1.0 K/uL   Eosinophils Relative 2 %   Eosinophils Absolute 0.2 0.0 - 0.5 K/uL   Basophils Relative 1 %   Basophils Absolute 0.1 0.0 - 0.1 K/uL   Immature Granulocytes 2 %   Abs Immature Granulocytes 0.22 (H) 0.00 - 0.07 K/uL    Comment: Performed at [facility], [address] [place], [place] [address]  APTT     Status: None   Collection Time: 03/28/19  2:19 AM  Result Value Ref Range   aPTT 30 24 - 36 seconds    Comment: Performed at [facility], [address] [place], [place] [address]  Protime-INR     Status: None   Collection Time: 03/28/19  2:19 AM  Result Value Ref Range   Prothrombin Time 14.2 11.4 - 15.2 seconds   INR 1.1 0.8 - 1.2    Comment: (NOTE) INR goal varies based on device and disease states. Performed at [facility], [address] [place], [place] [address]   Urinalysis, Routine w reflex microscopic     Status: Abnormal   Collection Time: 03/28/19  2:19 AM  Result Value Ref Range   Color, Urine YELLOW (A) YELLOW   APPearance CLOUDY (A) CLEAR   Specific Gravity, Urine 1.013 1.005 - 1.030   pH 5.0 5.0 - 8.0   Glucose, UA NEGATIVE NEGATIVE mg/dL   Hgb urine dipstick MODERATE (A) NEGATIVE   Bilirubin Urine NEGATIVE NEGATIVE   Ketones, ur NEGATIVE NEGATIVE mg/dL   Protein, ur 30 (A) NEGATIVE mg/dL    Nitrite NEGATIVE NEGATIVE   Leukocytes,Ua MODERATE (A) NEGATIVE   RBC / HPF >50 (H) 0 - 5 RBC/hpf   WBC, UA >50 (H) 0 - 5 WBC/hpf   Bacteria, UA MANY (A) NONE SEEN   Squamous Epithelial / LPF NONE SEEN 0 - 5   Mucus PRESENT    Budding Yeast PRESENT     Comment: Performed at [facility], [address] [place], [place] [address]  SARS Coronavirus 2 (CEPHEID- Performed in [facility] hospital lab), Hosp Order     Status: None   Collection Time: 03/28/19  2:19 AM   Specimen: Urine, Unspecified Source; Nasopharyngeal  Result Value Ref Range   SARS Coronavirus 2 NEGATIVE NEGATIVE    Comment: (NOTE) If result is NEGATIVE SARS-CoV-2 target nucleic acids are NOT DETECTED. The SARS-CoV-2 RNA is generally detectable in upper and lower  respiratory specimens during the acute phase of infection. The lowest  concentration of SARS-CoV-2 viral copies this assay can detect is 250  copies / mL. A negative result does not preclude SARS-CoV-2 infection  and should not be used as the sole basis for treatment or other  patient management decisions.  A negative result may occur with  improper specimen collection / handling, submission of specimen other  than nasopharyngeal swab, presence of viral mutation(s) within the  areas targeted by  this assay, and inadequate number of viral copies  (<250 copies / mL). A negative result must be combined with clinical  observations, patient history, and epidemiological information. If result is POSITIVE SARS-CoV-2 target nucleic acids are DETECTED. The SARS-CoV-2 RNA is generally detectable in upper and lower  respiratory specimens dur ing the acute phase of infection.  Positive  results are indicative of active infection with SARS-CoV-2.  Clinical  correlation with patient history and other diagnostic information is  necessary to determine patient infection status.  Positive results do  not rule out bacterial infection or co-infection with other  viruses. If result is PRESUMPTIVE POSTIVE SARS-CoV-2 nucleic acids MAY BE PRESENT.   A presumptive positive result was obtained on the submitted specimen  and confirmed on repeat testing.  While 2019 novel coronavirus  (SARS-CoV-2) nucleic acids may be present in the submitted sample  additional confirmatory testing may be necessary for epidemiological  and / or clinical management purposes  to differentiate between  SARS-CoV-2 and other Sarbecovirus currently known to infect humans.  If clinically indicated additional testing with an alternate test  methodology [phone] is advised. The SARS-CoV-2 RNA is generally  detectable in upper and lower respiratory sp ecimens during the acute  phase of infection. The expected result is Negative. Fact Sheet for Patients:  BoilerBrush.com.cyhttps://www.fda.gov/media/136312/download Fact Sheet for Healthcare Providers: https://pope.com/https://www.fda.gov/media/136313/download This test is not yet approved or cleared by the [place] FDA and has been authorized for detection and/or diagnosis of SARS-CoV-2 by FDA under an Emergency Use Authorization (EUA).  This EUA will remain in effect (meaning this test can be used) for the duration of the COVID-19 declaration under Section 564(b)(1) of the Act, 21 U.S.C. section 360bbb-3(b)(1), unless the authorization is terminated or revoked sooner. Performed at [facility], [address] [place], [place] [address]   Troponin I (High Sensitivity)     Status: Abnormal   Collection Time: 03/28/19  2:19 AM  Result Value Ref Range   Troponin I (High Sensitivity) 229 (HH) <18 ng/L    Comment: CRITICAL RESULT CALLED TO, READ BACK BY AND VERIFIED WITH LEAH FERGUSON RN AT 0310 ON 03/28/2019 SNG (NOTE) Elevated high sensitivity troponin I (hsTnI) values and significant  changes across serial measurements may suggest ACS but many other  chronic and acute conditions are known to elevate hsTnI results.  Refer to the "Links" section  for chest pain algorithms and additional  guidance. Performed at [facility], [address] [place], [place] [address]   Blood gas, arterial     Status: Abnormal   Collection Time: 03/28/19  2:55 AM  Result Value Ref Range   FIO2 0.40    Delivery systems VENTILATOR    Mode ASSIST CONTROL    VT 550 mL   Peep/cpap 5.0 cm H20   pH, Arterial 7.49 (H) 7.350 - 7.450   pCO2 arterial 46 32.0 - 48.0 mmHg   pO2, Arterial 103 83.0 - 108.0 mmHg   Bicarbonate 35.1 (H) 20.0 - 28.0 mmol/L   Acid-Base Excess 10.3 (H) 0.0 - 2.0 mmol/L   O2 Saturation 98.4 %   Patient temperature 37.0    Collection site RIGHT RADIAL    Sample type ARTERIAL DRAW    Allens test (pass/fail) PASS PASS   Mechanical Rate 26     Comment: Performed at [facility], [address] [place], [place] [address]   Dg Chest Port 1 View  Result Date: 03/28/2019 CLINICAL DATA:  Intubation EXAM: PORTABLE CHEST  1 VIEW COMPARISON:  02/23/2019 FINDINGS: The endotracheal tube tip just below the level of clavicular heads. Orogastric tube side port projects over the gastric body. Patient is markedly rotated to the left, obscuring most of the left hemithorax. Visualized right lung is clear. IMPRESSION: Radiographically appropriate position of endotracheal 2. Electronically Signed   By: Ulyses Jarred M.D.   On: 03/28/2019 02:34    Pending Labs Unresulted Labs (From admission, onward)    Start     Ordered   03/28/19 0200  Lactic acid, plasma  Now then every 2 hours,   STAT     03/28/19 0159   03/28/19 0200  Blood Culture (routine x 2)  BLOOD CULTURE X 2,   STAT     03/28/19 0159   03/28/19 0200  Urine culture  ONCE - STAT,   STAT     03/28/19 0159   Signed and Held  Creatinine, serum  (enoxaparin (LOVENOX)    CrCl >/= 30 ml/min)  Weekly,   R    Comments: while on enoxaparin therapy    Signed and Held   Signed and Held  TSH  Add-on,   R     Signed and Held          Vitals/Pain Today's Vitals   03/28/19  0149 03/28/19 0153 03/28/19 0156 03/28/19 0203  BP:   (!) 191/160 (!) 79/32  Pulse:   65 (!) 56  Resp:   20 (!) 26  SpO2: 100%  100% 100%  Weight:  136.1 kg    Height:  5\' 11"  (1.803 m)      Isolation Precautions Airborne and Contact precautions  Medications Medications  cefTRIAXone (ROCEPHIN) 2 g in sodium chloride 0.9 % 100 mL IVPB (0 g Intravenous Stopped 03/28/19 0245)  azithromycin (ZITHROMAX) 500 mg in sodium chloride 0.9 % 250 mL IVPB (500 mg Intravenous New Bag/Given 03/28/19 0351)  propofol (DIPRIVAN) 1000 MG/100ML infusion (10 mcg/kg/min  136.1 kg Intravenous New Bag/Given 03/28/19 0402)  norepinephrine (LEVOPHED) 4mg  in 274mL premix infusion (10 mcg/min Intravenous New Bag/Given 03/28/19 0347)  rocuronium (ZEMURON) injection (10 mg Intravenous Given 03/28/19 0152)  sodium chloride 0.9 % bolus 1,000 mL (1,000 mLs Intravenous New Bag/Given 03/28/19 0200)    Mobility Unknown     Focused Assessments Neuro Assessment Handoff:  Swallow screen pass? no         Neuro Assessment:   Neuro Checks:      Last Documented NIHSS Modified Score:   Has TPA been given? No If patient is a Neuro Trauma and patient is going to OR before floor call report to [place] nurse: [phone] or [phone]     R Recommendations: See Admitting Provider Note  Report given to:   Additional Notes: daughter called and called back

## 2019-03-28 NOTE — Progress Notes (Signed)
Pt transported to icu -7 without any incident ,report given to rt in icu.

## 2019-03-28 NOTE — ED Notes (Signed)
MD Owens Shark aware of critical lactic.

## 2019-03-28 NOTE — Progress Notes (Signed)
Updated children via telephone and obtained password due to Covid 19

## 2019-03-28 NOTE — ED Notes (Signed)
RT called for trans

## 2019-03-28 NOTE — Consult Note (Signed)
Pharmacy Antibiotic Note  Dylan [name] is a 83 y.o. male admitted on 03/28/2019 with bacteremia.  Pharmacy has been consulted for Vancomycin dosing.  Plan: Vancomycin 2500 mg x1 dose. Will start maintenance dose of 1500 mg Q24H.  AUC goal: 400-550 Expected AUC: 470.4 Cssmin 11.0  Height: 5\' 11"  (180.3 cm) Weight: 285 lb 0.9 oz (129.3 kg) IBW/kg (Calculated) : 75.3  Temp (24hrs), Avg:98.5 F (36.9 C), Min:96.7 F (35.9 C), Max:99.9 F (37.7 C)  Recent Labs  Lab 03/28/19 0219 03/28/19 0403  WBC 10.9*  --   CREATININE 1.14  --   LATICACIDVEN 4.0* 2.6*    Estimated Creatinine Clearance: 69.7 mL/min (by C-G formula based on SCr of 1.14 mg/dL).    No Known Allergies  Antimicrobials this admission: 7/26 CTX >>7/26 7/26 Azithromycin >>7/26 7/26 Vancomycin >>   Microbiology results: 7/26 BCx: Staphylococcus species (anaerobic and aerobic bottles), MRSA detected   Thank you for allowing pharmacy to be a part of this patient's care.  Rowland Lathe 03/28/2019 10:16 PM

## 2019-03-28 NOTE — ED Triage Notes (Signed)
Patient presents to Emergency Department via [facility] from HOME with complaints of unresponsive     Hospice  EMS reports found at 40% RA

## 2019-03-28 NOTE — Progress Notes (Signed)
Maggie, NP notified of lactic acid 2.6; acknowledged, no new orders at this time.

## 2019-03-28 NOTE — Consult Note (Signed)
PULMONARY / CRITICAL CARE MEDICINE  Name: Dylan [name] MRN: [id number] DOB: Aug 21, 1936    LOS: 0  Referring Provider: Dr. 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  En route to the ED, patient a King airway was placed due to deteriorating respiratory status on supplemental oxygen.  At the ED, he was emergently intubated.  His chest x-ray showed acute CHF exacerbation and severe pulmonary edema and a lactic acid level of 4.0. His last hospitalization was in June 2020.  He was discharged on June 24 and was seen at home by hospice March 04, 2019 at the request of his primary care physician.  Advance care planning discussions were initiated but he was still a full code.  During the hospice visit, patient did express the desire for optimal symptom management and the desire to stay out of the hospital.   Past Medical History:  Diagnosis Date  . CAD (coronary artery disease)   . Cervicalgia   . CHF (congestive heart failure) ([place])   . COPD (chronic obstructive pulmonary disease) ([place])   . Diastolic heart failure ([place])   . Foot drop, right   . History of kidney stones   . Hyperlipidemia    unspecified  . Hypertension   . Myocardial infarction ([place])   . Osteoarthritis   . Shoulder pain, left   . Sleep apnea   . Tremor, essential    Past Surgical History:  Procedure Laterality Date  . APPLICATION OF WOUND VAC Right 11/13/2017   Procedure: APPLICATION OF WOUND VAC;  Surgeon: Algernon Huxley, MD;  Location: [facility];  Service: Vascular;  Laterality: Right;  . BACK SURGERY  06/2010  . CARDIAC CATHETERIZATION Left 04/30/2016   Procedure: Left Heart Cath and Coronary Angiography;  Surgeon: Dionisio David, MD;  Location: [place] [facility];  Service: Cardiovascular;  Laterality: Left;  . COLONOSCOPY    . CORONARY ANGIOPLASTY    . CORONARY/GRAFT ACUTE MI REVASCULARIZATION N/A 06/21/2018   Procedure: Coronary/Graft Acute MI Revascularization;  Surgeon: Wellington Hampshire, MD;  Location: [place] [facility];  Service: Cardiovascular;  Laterality: N/A;  . KNEE ARTHROPLASTY Left 06/01/2018   Procedure: COMPUTER ASSISTED TOTAL KNEE ARTHROPLASTY;  Surgeon: Dereck Leep, MD;  Location: [facility];  Service: Orthopedics;  Laterality: Left;  . KNEE ARTHROSCOPY Left   . KNEE SURGERY Left 1998  . LEFT HEART CATH AND CORONARY ANGIOGRAPHY N/A 06/21/2018   Procedure: LEFT HEART CATH AND CORONARY ANGIOGRAPHY;  Surgeon: Wellington Hampshire, MD;  Location: [place] [facility];  Service: Cardiovascular;  Laterality: N/A;  . ORIF FEMUR FRACTURE Left 08/27/2018   Procedure: OPEN REDUCTION INTERNAL FIXATION (ORIF) SUPRACONDYLAR FEMUR FRACTURE ABOVE PROSTHESIS, QUADRICEPS REPAIR;  Surgeon: Hessie Knows, MD;  Location: [facility];  Service: Orthopedics;  Laterality: Left;  . TEE WITHOUT CARDIOVERSION N/A 11/18/2018   Procedure: TRANSESOPHAGEAL ECHOCARDIOGRAM (TEE);  Surgeon: Corey Skains, MD;  Location: [facility];  Service: Cardiovascular;  Laterality: N/A;  . TONSILLECTOMY    . WOUND DEBRIDEMENT Right  11/13/2017   Procedure: DEBRIDEMENT WOUND;  Surgeon: Annice Needyew, Jason S, MD;  Location: [facility];  Service: Vascular;  Laterality: Right;   No current facility-administered medications on file prior to encounter.    Current Outpatient Medications on File Prior to Encounter  Medication Sig  . acetaminophen (TYLENOL) 500 MG tablet Take 1,000 mg by mouth 2 (two) times daily as needed for moderate pain.  Marland Kitchen.  alum & mag hydroxide-simeth (MAALOX PLUS) 400-400-40 MG/5ML suspension Take 30 mLs by mouth every 4 (four) hours as needed for indigestion.  . Ascorbic Acid (VITAMIN C PO) Take 500 mg by mouth daily.   Marland Kitchen. aspirin EC 81 MG tablet Take 81 mg by mouth daily.  Marland Kitchen. atorvastatin (LIPITOR) 80 MG tablet Take 80 mg by mouth daily.   . carvedilol (COREG) 6.25 MG tablet Take 6.25 mg by mouth 2 (two) times daily with a meal.  . Cholecalciferol (VITAMIN D-1000 MAX ST) 25 MCG (1000 UT) tablet Take 1 tablet (1,000 Units total) by mouth daily.  . folic acid (V-R FOLIC ACID) 400 MCG tablet Take 1 tablet (400 mcg total) by mouth daily.  . furosemide (LASIX) 40 MG tablet Take 40 mg by mouth 2 (two) times daily.  Marland Kitchen. gabapentin (NEURONTIN) 300 MG capsule Take 1 capsule (300 mg total) by mouth at bedtime.  Marland Kitchen. ipratropium-albuterol (DUONEB) 0.5-2.5 (3) MG/3ML SOLN Take 3 mLs by nebulization every 6 (six) hours as needed (shortness of breath).   . isosorbide mononitrate (IMDUR) 30 MG 24 hr tablet Take 30 mg by mouth daily.   Marland Kitchen. ketotifen (ZADITOR) 0.025 % ophthalmic solution Place 1 drop into both eyes as needed.  . loratadine (CLARITIN) 10 MG tablet Take 1 tablet (10 mg total) by mouth as needed for allergies.  . magnesium hydroxide (MILK OF MAGNESIA) 400 MG/5ML suspension Take 30 mLs by mouth daily as needed for mild constipation.   . Multiple Vitamins-Minerals (CENTROVITE) TABS Take 1 tablet by mouth daily.  . nitroGLYCERIN (NITROSTAT) 0.3 MG SL tablet Place 0.3 mg under the tongue every 5 (five) minutes as needed for chest pain.  . pantoprazole (PROTONIX) 40 MG tablet Take 1 tablet (40 mg total) by mouth daily.  . polyethylene glycol (MIRALAX / GLYCOLAX) packet Take 17 g by mouth 2 (two) times daily as needed. Hold for loose stools.  . potassium chloride SA (K-DUR) 20 MEQ tablet Take 1 tablet (20 mEq total) by mouth daily.  . primidone (MYSOLINE) 50 MG tablet Take 150 mg by mouth 2 (two) times daily.   . QUEtiapine  (SEROQUEL) 50 MG tablet Take 1 tablet (50 mg total) by mouth at bedtime as needed.  . sacubitril-valsartan (ENTRESTO) 24-26 MG Take 1 tablet by mouth every 12 (twelve) hours.  . sennosides-docusate sodium (SENOKOT-S) 8.6-50 MG tablet Take 2 tablets by mouth daily as needed for constipation.  Marland Kitchen. spironolactone (ALDACTONE) 25 MG tablet Take 12.5 mg by mouth daily.   . traZODone (DESYREL) 50 MG tablet Take 50 mg by mouth at bedtime.  Marland Kitchen. umeclidinium-vilanterol (ANORO ELLIPTA) 62.5-25 MCG/INH AEPB Inhale 1 puff into the lungs as needed.    Allergies No Known Allergies  Family History Family History  Problem Relation Age of Onset  . Diabetes Son   . Cancer Mother   . Colon cancer Mother   . Lung cancer Mother   . Tremor Father   . Heart disease Father   . Tremor Brother   . Bladder Cancer Brother   . Tremor Sister    Social History  reports that he has quit smoking. His smoking use included cigarettes. He has a 14.50 pack-year smoking history. He has quit using smokeless tobacco. He reports that he does not drink alcohol or use drugs.  Review Of Systems:   Unable to obtain as patient is currently intubated and sedated  VITAL SIGNS: BP 139/69   Pulse (!) 59   Temp (!) 96.7 F (35.9 C) (Axillary)   Resp (!) 22   Ht 5\' 11"  (1.803 m)   Wt 129.3 kg   SpO2 96%   BMI 39.76 kg/m   HEMODYNAMICS:    VENTILATOR SETTINGS: Vent Mode: PRVC FiO2 (%):  [30 %-100 %] 30 % Set Rate:  [22 bmp-26 bmp] 22 bmp Vt Set:  [550 mL] 550 mL PEEP:  [5 cmH20] 5 cmH20 Plateau Pressure:  [20 cmH20] 20 cmH20  INTAKE / OUTPUT: No intake/output data recorded.  PHYSICAL EXAMINATION: General: Chronically ill looking, sedated HEENT: Normocephalic and atraumatic, PERRLA, trachea midline, moderate elevation in JVD, intubated Neuro: Unresponsive to noxious stimulus, moves all extremities Cardiovascular: Apical pulse regular, S1-S2, no murmur regurg or gallop, +2 pulses bilaterally, nonpitting edema  bilaterally Lungs: Breath sounds diminished in overall lung fields with diffuse crackles in anterior lung fields Abdomen: Abdomen is nondistended, normal bowel sounds in all 4 quadrants palpation reveals no organomegaly Musculoskeletal: No joint deformities, positive range of motion GU: Scrotal edema and chronic Foley due to urinary retention Skin: Stage II pressure ulcer bilateral buttocks  LABS:  BMET Recent Labs  Lab 03/28/19 0219  NA 143  K 4.8  CL 101  CO2 31  BUN 34*  CREATININE 1.14  GLUCOSE 188*    Electrolytes Recent Labs  Lab 03/28/19 0219  CALCIUM 8.4*    CBC Recent Labs  Lab 03/28/19 0219  WBC 10.9*  HGB 10.9*  HCT 36.9*  PLT 188    Coag's Recent Labs  Lab 03/28/19 0219  APTT 30  INR 1.1    Sepsis Markers Recent Labs  Lab 03/28/19 0219 03/28/19 0403  LATICACIDVEN 4.0* 2.6*    ABG Recent Labs  Lab 03/28/19 0255  PHART 7.49*  PCO2ART 46  PO2ART 103    Liver Enzymes Recent Labs  Lab 03/28/19 0219  AST 49*  ALT 28  ALKPHOS 148*  BILITOT 0.8  ALBUMIN 2.5*    Cardiac Enzymes No results for input(s): TROPONINI, PROBNP in the last 168 hours.  Glucose No results for input(s): GLUCAP in the last 168 hours.  Imaging Dg Chest Port 1 View  Result Date: 03/28/2019 CLINICAL DATA:  Intubation EXAM: PORTABLE CHEST 1 VIEW COMPARISON:  02/23/2019 FINDINGS: The endotracheal tube tip just below the level of clavicular heads. Orogastric tube side port projects over the gastric body. Patient is markedly rotated to the left, obscuring most of the left hemithorax. Visualized right lung is clear. IMPRESSION: Radiographically appropriate position of endotracheal 2. Electronically Signed   By: Deatra RobinsonKevin  Herman M.D.   On: 03/28/2019 02:34    STUDIES:  None  CULTURES: Blood cultures x2 Urine culture Sputum culture  ANTIBIOTICS: MRSA screen positive  SIGNIFICANT EVENTS: 03/28/2019: Admitted  LINES/TUBES: ET tube Peripheral IVs Foley  catheter OG tube  DISCUSSION: 83 year old male end-stage COPD, CHF with reduced EF and coronary artery disease presenting with acute hypoxic respiratory failure due to a combination of CHF exacerbation and COPD exacerbation  ASSESSMENT / PLAN:  PULMONARY A: Acute hypoxic respiratory failure Pulmonary edema Acute on chronic COPD exacerbation Sleep apnea-on home trilogy and supplemental oxygen P:  Full vent support with current settings VAP protocol Weaning trials as tolerated Gentle diuresis with Lasix 20 mg every 12 Chest x-ray and ABG as needed Nebulized bronchodilators  CARDIOVASCULAR A:  Acute on chronic diastolic heart failure with EF of 35 to 40% CAD status post MI Bradycardia Shock requiring pressors Hypertension Hyperlipidemia P:  Hemodynamic monitoring per ICU protocol IV diuresis Cardiology consult Norepinephrine infusion to maintain mean arterial blood pressure greater than 65 Stat EKG obtained and reviewed and shows sinus bradycardia Trend cardiac enzymes Palliative care consult for goals of care RENAL A:   Chronic renal failure stage II- creatinine 1.4 and GFR 60 P:   Trend renal indices Avoid nephrotoxic drugs  GASTROINTESTINAL A:    P:   Nasogastric tube to low intermittent suction GI prophylaxis with Protonix  HEMATOLOGIC A:   Anemia of chronic disease P:  Hemoglobin is stable at 10.9 Trend hemoglobin and hematocrit and transfuse for hemoglobin less than 7  INFECTIOUS A:   Questionable pneumonia-no leukocytosis Positive MRSA screen P:   Procalcitonin less than 10 DC antibiotics Bactroban ointment to bilateral nares ENDOCRINE A:   Mild hyperglycemia without a diagnosis of diabetes P:   Blood glucose monitoring every 6 hours with no sliding scale coverage Consider sliding scale coverage for blood glucose levels consistently greater than 200 mg/dL  NEUROLOGIC A:   Acute hypoxic encephalopathy P:   RASS goal: 0 to -1 Full vent  support Change sedation from propofol to fentanyl and PRN Versed in light of bradycardia  Best Practice: Code Status: Full code Diet: N.p.o. GI prophylaxis: Protonix VTE prophylaxis: SCDs and subcu heparin  FAMILY  - Updates: Family updated by ED and by nurse.  Will update with any new changes in patient's condition  Laree Garron S. Tukov-Y UAL ANP-BC Pulmonary and Critical Care Medicine [facility] Pager [phone] or [phone]  NB: This document was prepared using Dragon voice recognition software and may include unintentional dictation errors.    03/28/2019, 6:04 AM

## 2019-03-28 NOTE — Progress Notes (Signed)
CODE SEPSIS - PHARMACY COMMUNICATION  **Broad Spectrum Antibiotics should be administered within 1 hour of Sepsis diagnosis**  Time Code Sepsis Called/Page Received: @ 0203  Antibiotics Ordered: Ceftriaxone and Azithromycin   Time of 1st antibiotic administration: @ [place], PharmD, BCPS Clinical Pharmacist 03/28/2019 3:32 AM

## 2019-03-28 NOTE — H&P (Signed)
Dylan [name] is an 83 y.o. male.   Chief Complaint: Unresponsive HPI: The patient with past medical history of COPD, CAD, obstructive sleep apnea and hypertension presents to the emergency department via EMS after being found unresponsive at home.  He reportedly had oxygen saturations less than 50% on room air.  The patient was promptly intubated in the emergency department started on broad-spectrum antibiotics prior to the emergency department staff called the hospitalist service for admission.  Past Medical History:  Diagnosis Date  . CAD (coronary artery disease)   . Cervicalgia   . CHF (congestive heart failure) (HCC)   . COPD (chronic obstructive pulmonary disease) (HCC)   . Diastolic heart failure (HCC)   . Foot drop, right   . History of kidney stones   . Hyperlipidemia    unspecified  . Hypertension   . Myocardial infarction (HCC)   . Osteoarthritis   . Shoulder pain, left   . Sleep apnea   . Tremor, essential     Past Surgical History:  Procedure Laterality Date  . APPLICATION OF WOUND VAC Right 11/13/2017   Procedure: APPLICATION OF WOUND VAC;  Surgeon: Annice Needyew, Jason S, MD;  Location: [facility] ORS;  Service: Vascular;  Laterality: Right;  . BACK SURGERY  06/2010  . CARDIAC CATHETERIZATION Left 04/30/2016   Procedure: Left Heart Cath and Coronary Angiography;  Surgeon: Laurier NancyShaukat A Khan, MD;  Location: [facility] IN[facility]IVE [facility];  Service: Cardiovascular;  Laterality: Left;  . COLONOSCOPY    . CORONARY ANGIOPLASTY    . CORONARY/GRAFT ACUTE MI REVASCULARIZATION N/A 06/21/2018   Procedure: Coronary/Graft Acute MI Revascularization;  Surgeon: Iran OuchArida, Muhammad A, MD;  Location: [facility];  Service: Cardiovascular;  Laterality: N/A;  . KNEE ARTHROPLASTY Left 06/01/2018   Procedure: COMPUTER ASSISTED TOTAL KNEE ARTHROPLASTY;  Surgeon: Donato HeinzHooten, James P, MD;  Location: [facility] OR[facility];  Service: Orthopedics;  Laterality: Left;  . KNEE ARTHROSCOPY Left   . KNEE SURGERY Left 1998  . LEFT HEART  CATH AND CORONARY ANGIOGRAPHY N/A 06/21/2018   Procedure: LEFT HEART CATH AND CORONARY ANGIOGRAPHY;  Surgeon: Iran OuchArida, Muhammad A, MD;  Location: [facility];  Service: Cardiovascular;  Laterality: N/A;  . ORIF FEMUR FRACTURE Left 08/27/2018   Procedure: OPEN REDUCTION INTERNAL FIXATION (ORIF) SUPRACONDYLAR FEMUR FRACTURE ABOVE PROSTHESIS, QUADRICEPS REPAIR;  Surgeon: Kennedy BuckerMenz, Delmos Velaquez, MD;  Location: [facility];  Service: Orthopedics;  Laterality: Left;  . TEE WITHOUT CARDIOVERSION N/A 11/18/2018   Procedure: TRANSESOPHAGEAL ECHOCARDIOGRAM (TEE);  Surgeon: Lamar BlinksKowalski, Bruce J, MD;  Location: [facility] OR[facility];  Service: Cardiovascular;  Laterality: N/A;  . TONSILLECTOMY    . WOUND DEBRIDEMENT Right 11/13/2017   Procedure: DEBRIDEMENT WOUND;  Surgeon: Annice Needyew, Jason S, MD;  Location: [facility];  Service: Vascular;  Laterality: Right;    Family History  Problem Relation Age of Onset  . Diabetes Son   . Cancer Mother   . Colon cancer Mother   . Lung cancer Mother   . Tremor Father   . Heart disease Father   . Tremor Brother   . Bladder Cancer Brother   . Tremor Sister    Social History:  reports that he has quit smoking. His smoking use included cigarettes. He has a 14.50 pack-year smoking history. He has quit using smokeless tobacco. He reports that he does not drink alcohol or use drugs.  Allergies: No Known Allergies  Medications Prior to Admission  Medication Sig Dispense Refill  . acetaminophen (TYLENOL) 500 MG tablet Take 1,000 mg by mouth  2 (two) times daily as needed for moderate pain.    Marland Kitchen alum & mag hydroxide-simeth (MAALOX PLUS) 400-400-40 MG/5ML suspension Take 30 mLs by mouth every 4 (four) hours as needed for indigestion.    . Ascorbic Acid (VITAMIN C PO) Take 500 mg by mouth daily.     Marland Kitchen aspirin EC 81 MG tablet Take 81 mg by mouth daily.    Marland Kitchen atorvastatin (LIPITOR) 80 MG tablet Take 80 mg by mouth daily.     . carvedilol (COREG) 6.25 MG tablet Take 6.25 mg by mouth 2 (two) times daily  with a meal.    . Cholecalciferol (VITAMIN D-1000 MAX ST) 25 MCG (1000 UT) tablet Take 1 tablet (1,000 Units total) by mouth daily.    . folic acid (V-R FOLIC ACID) 400 MCG tablet Take 1 tablet (400 mcg total) by mouth daily.    . furosemide (LASIX) 40 MG tablet Take 40 mg by mouth 2 (two) times daily.    Marland Kitchen gabapentin (NEURONTIN) 300 MG capsule Take 1 capsule (300 mg total) by mouth at bedtime.    Marland Kitchen ipratropium-albuterol (DUONEB) 0.5-2.5 (3) MG/3ML SOLN Take 3 mLs by nebulization every 6 (six) hours as needed (shortness of breath).     . isosorbide mononitrate (IMDUR) 30 MG 24 hr tablet Take 30 mg by mouth daily.     Marland Kitchen ketotifen (ZADITOR) 0.025 % ophthalmic solution Place 1 drop into both eyes as needed. 5 mL 0  . loratadine (CLARITIN) 10 MG tablet Take 1 tablet (10 mg total) by mouth as needed for allergies.    . magnesium hydroxide (MILK OF MAGNESIA) 400 MG/5ML suspension Take 30 mLs by mouth daily as needed for mild constipation.     . Multiple Vitamins-Minerals (CENTROVITE) TABS Take 1 tablet by mouth daily.    . nitroGLYCERIN (NITROSTAT) 0.3 MG SL tablet Place 0.3 mg under the tongue every 5 (five) minutes as needed for chest pain.    . pantoprazole (PROTONIX) 40 MG tablet Take 1 tablet (40 mg total) by mouth daily. 30 tablet 0  . polyethylene glycol (MIRALAX / GLYCOLAX) packet Take 17 g by mouth 2 (two) times daily as needed. Hold for loose stools.    . potassium chloride SA (K-DUR) 20 MEQ tablet Take 1 tablet (20 mEq total) by mouth daily.    . primidone (MYSOLINE) 50 MG tablet Take 150 mg by mouth 2 (two) times daily.     . QUEtiapine (SEROQUEL) 50 MG tablet Take 1 tablet (50 mg total) by mouth at bedtime as needed. 30 tablet 0  . sacubitril-valsartan (ENTRESTO) 24-26 MG Take 1 tablet by mouth every 12 (twelve) hours.    . sennosides-docusate sodium (SENOKOT-S) 8.6-50 MG tablet Take 2 tablets by mouth daily as needed for constipation.    Marland Kitchen spironolactone (ALDACTONE) 25 MG tablet Take 12.5  mg by mouth daily.     . traZODone (DESYREL) 50 MG tablet Take 50 mg by mouth at bedtime.    Marland Kitchen umeclidinium-vilanterol (ANORO ELLIPTA) 62.5-25 MCG/INH AEPB Inhale 1 puff into the lungs as needed.      Results for orders placed or performed during the hospital encounter of 03/28/19 (from the past 48 hour(s))  Lactic acid, plasma     Status: Abnormal   Collection Time: 03/28/19  2:19 AM  Result Value Ref Range   Lactic Acid, Venous 4.0 (HH) 0.5 - 1.9 mmol/L    Comment: CRITICAL RESULT CALLED TO, READ BACK BY AND VERIFIED WITH REBECCA LYNN RN AT [phone]  ON 03/28/2019 SNG Performed at [facility], [address] [place], [place] [address]   Comprehensive metabolic panel     Status: Abnormal   Collection Time: 03/28/19  2:19 AM  Result Value Ref Range   Sodium 143 135 - 145 mmol/L   Potassium 4.8 3.5 - 5.1 mmol/L    Comment: HEMOLYSIS AT THIS LEVEL MAY AFFECT RESULT   Chloride 101 98 - 111 mmol/L   CO2 31 22 - 32 mmol/L   Glucose, Bld 188 (H) 70 - 99 mg/dL   BUN 34 (H) 8 - 23 mg/dL   Creatinine, Ser 1.14 0.61 - 1.24 mg/dL   Calcium 8.4 (L) 8.9 - 10.3 mg/dL   Total Protein 6.3 (L) 6.5 - 8.1 g/dL   Albumin 2.5 (L) 3.5 - 5.0 g/dL   AST 49 (H) 15 - 41 U/L   ALT 28 0 - 44 U/L   Alkaline Phosphatase 148 (H) 38 - 126 U/L   Total Bilirubin 0.8 0.3 - 1.2 mg/dL   GFR calc non Af Amer 60 (L) >60 mL/min   GFR calc Af Amer >60 >60 mL/min   Anion gap 11 5 - 15    Comment: Performed at [facility], [address] [place], [place] [address]  CBC WITH DIFFERENTIAL     Status: Abnormal   Collection Time: 03/28/19  2:19 AM  Result Value Ref Range   WBC 10.9 (H) 4.0 - 10.5 K/uL   RBC 3.56 (L) 4.22 - 5.81 MIL/uL   Hemoglobin 10.9 (L) 13.0 - 17.0 g/dL   HCT 36.9 (L) 39.0 - 52.0 %   MCV 103.7 (H) 80.0 - 100.0 fL   MCH 30.6 26.0 - 34.0 pg   MCHC 29.5 (L) 30.0 - 36.0 g/dL   RDW 17.2 (H) 11.5 - 15.5 %   Platelets 188 150 - 400 K/uL   nRBC 0.0 0.0 - 0.2 %   Neutrophils Relative %  84 %   Neutro Abs 9.3 (H) 1.7 - 7.7 K/uL   Lymphocytes Relative 6 %   Lymphs Abs 0.6 (L) 0.7 - 4.0 K/uL   Monocytes Relative 5 %   Monocytes Absolute 0.5 0.1 - 1.0 K/uL   Eosinophils Relative 2 %   Eosinophils Absolute 0.2 0.0 - 0.5 K/uL   Basophils Relative 1 %   Basophils Absolute 0.1 0.0 - 0.1 K/uL   Immature Granulocytes 2 %   Abs Immature Granulocytes 0.22 (H) 0.00 - 0.07 K/uL    Comment: Performed at [facility], [address] [place], [place] [address]  APTT     Status: None   Collection Time: 03/28/19  2:19 AM  Result Value Ref Range   aPTT 30 24 - 36 seconds    Comment: Performed at [facility], [address] [place], [place] [address]  Protime-INR     Status: None   Collection Time: 03/28/19  2:19 AM  Result Value Ref Range   Prothrombin Time 14.2 11.4 - 15.2 seconds   INR 1.1 0.8 - 1.2    Comment: (NOTE) INR goal varies based on device and disease states. Performed at [facility], [address] [place], [place] [address]   Urinalysis, Routine w reflex microscopic     Status: Abnormal   Collection Time: 03/28/19  2:19 AM  Result Value Ref Range   Color, Urine YELLOW (A) YELLOW   APPearance CLOUDY (A) CLEAR   Specific Gravity, Urine 1.013 1.005 - 1.030   pH 5.0  5.0 - 8.0   Glucose, UA NEGATIVE NEGATIVE mg/dL   Hgb urine dipstick MODERATE (A) NEGATIVE   Bilirubin Urine NEGATIVE NEGATIVE   Ketones, ur NEGATIVE NEGATIVE mg/dL   Protein, ur 30 (A) NEGATIVE mg/dL   Nitrite NEGATIVE NEGATIVE   Leukocytes,Ua MODERATE (A) NEGATIVE   RBC / HPF >50 (H) 0 - 5 RBC/hpf   WBC, UA >50 (H) 0 - 5 WBC/hpf   Bacteria, UA MANY (A) NONE SEEN   Squamous Epithelial / LPF NONE SEEN 0 - 5   Mucus PRESENT    Budding Yeast PRESENT     Comment: Performed at [facility], [address] [place], [place] [address]  SARS Coronavirus 2 (CEPHEID- Performed in [facility] hospital lab), Hosp Order     Status: None   Collection Time: 03/28/19   2:19 AM   Specimen: Urine, Unspecified Source; Nasopharyngeal  Result Value Ref Range   SARS Coronavirus 2 NEGATIVE NEGATIVE    Comment: (NOTE) If result is NEGATIVE SARS-CoV-2 target nucleic acids are NOT DETECTED. The SARS-CoV-2 RNA is generally detectable in upper and lower  respiratory specimens during the acute phase of infection. The lowest  concentration of SARS-CoV-2 viral copies this assay can detect is 250  copies / mL. A negative result does not preclude SARS-CoV-2 infection  and should not be used as the sole basis for treatment or other  patient management decisions.  A negative result may occur with  improper specimen collection / handling, submission of specimen other  than nasopharyngeal swab, presence of viral mutation(s) within the  areas targeted by this assay, and inadequate number of viral copies  (<250 copies / mL). A negative result must be combined with clinical  observations, patient history, and epidemiological information. If result is POSITIVE SARS-CoV-2 target nucleic acids are DETECTED. The SARS-CoV-2 RNA is generally detectable in upper and lower  respiratory specimens dur ing the acute phase of infection.  Positive  results are indicative of active infection with SARS-CoV-2.  Clinical  correlation with patient history and other diagnostic information is  necessary to determine patient infection status.  Positive results do  not rule out bacterial infection or co-infection with other viruses. If result is PRESUMPTIVE POSTIVE SARS-CoV-2 nucleic acids MAY BE PRESENT.   A presumptive positive result was obtained on the submitted specimen  and confirmed on repeat testing.  While 2019 novel coronavirus  (SARS-CoV-2) nucleic acids may be present in the submitted sample  additional confirmatory testing may be necessary for epidemiological  and / or clinical management purposes  to differentiate between  SARS-CoV-2 and other Sarbecovirus currently known to  infect humans.  If clinically indicated additional testing with an alternate test  methodology [id number]) is advised. The SARS-CoV-2 RNA is generally  detectable in upper and lower respiratory sp ecimens during the acute  phase of infection. The expected result is Negative. Fact Sheet for Patients:  BoilerBrush.com.cyhttps://www.fda.gov/media/136312/download Fact Sheet for Healthcare Providers: https://pope.com/https://www.fda.gov/media/136313/download This test is not yet approved or cleared by the [place] FDA and has been authorized for detection and/or diagnosis of SARS-CoV-2 by FDA under an Emergency Use Authorization (EUA).  This EUA will remain in effect (meaning this test can be used) for the duration of the COVID-19 declaration under Section 564(b)(1) of the Act, 21 U.S.C. section 360bbb-3(b)(1), unless the authorization is terminated or revoked sooner. Performed at [facility], [address] [place], [place] [address]   Troponin I (High Sensitivity)     Status: Abnormal  Collection Time: 03/28/19  2:19 AM  Result Value Ref Range   Troponin I (High Sensitivity) 229 (HH) <18 ng/L    Comment: CRITICAL RESULT CALLED TO, READ BACK BY AND VERIFIED WITH LEAH FERGUSON RN AT 0310 ON 03/28/2019 SNG (NOTE) Elevated high sensitivity troponin I (hsTnI) values and significant  changes across serial measurements may suggest ACS but many other  chronic and acute conditions are known to elevate hsTnI results.  Refer to the "Links" section for chest pain algorithms and additional  guidance. Performed at [facility], [address] [place], [place] [address]   Blood gas, arterial     Status: Abnormal   Collection Time: 03/28/19  2:55 AM  Result Value Ref Range   FIO2 0.40    Delivery systems VENTILATOR    Mode ASSIST CONTROL    VT 550 mL   Peep/cpap 5.0 cm H20   pH, Arterial 7.49 (H) 7.350 - 7.450   pCO2 arterial 46 32.0 - 48.0 mmHg   pO2, Arterial 103 83.0 - 108.0 mmHg   Bicarbonate  35.1 (H) 20.0 - 28.0 mmol/L   Acid-Base Excess 10.3 (H) 0.0 - 2.0 mmol/L   O2 Saturation 98.4 %   Patient temperature 37.0    Collection site RIGHT RADIAL    Sample type ARTERIAL DRAW    Allens test (pass/fail) PASS PASS   Mechanical Rate 26     Comment: Performed at [facility], [address] [place], [place] [address]  Lactic acid, plasma     Status: Abnormal   Collection Time: 03/28/19  4:03 AM  Result Value Ref Range   Lactic Acid, Venous 2.6 (HH) 0.5 - 1.9 mmol/L    Comment: CRITICAL RESULT CALLED TO, READ BACK BY AND VERIFIED WITH MELISSA COBB ON 03/28/19 AT 0439 [facility] Performed at [facility], [address] [place], [place] [address]    Dg Chest Port 1 View  Result Date: 03/28/2019 CLINICAL DATA:  Intubation EXAM: PORTABLE CHEST 1 VIEW COMPARISON:  02/23/2019 FINDINGS: The endotracheal tube tip just below the level of clavicular heads. Orogastric tube side port projects over the gastric body. Patient is markedly rotated to the left, obscuring most of the left hemithorax. Visualized right lung is clear. IMPRESSION: Radiographically appropriate position of endotracheal 2. Electronically Signed   By: Deatra RobinsonKevin  Herman M.D.   On: 03/28/2019 02:34    Review of Systems  Unable to perform ROS: Intubated    Blood pressure 137/70, pulse (!) 58, resp. rate (!) 26, height 5\' 11"  (1.803 m), weight 136.1 kg, SpO2 100 %. Physical Exam  Vitals reviewed. Constitutional: He is oriented to person, place, and time. He appears well-developed and well-nourished. No distress.  HENT:  Head: Normocephalic and atraumatic.  Mouth/Throat: Oropharynx is clear and moist.  Eyes: Pupils are equal, round, and reactive to light. Conjunctivae and EOM are normal. No scleral icterus.  Neck: Normal range of motion. Neck supple. No JVD present. No tracheal deviation present. No thyromegaly present.  Cardiovascular: Normal rate and regular rhythm. Exam reveals no gallop and no friction rub.   No murmur heard. Respiratory: Effort normal and breath sounds normal. No respiratory distress.  GI: Soft. Bowel sounds are normal. He exhibits no distension. There is no abdominal tenderness.  Genitourinary:    Genitourinary Comments: Deferred   Musculoskeletal: Normal range of motion.        General: No edema.  Lymphadenopathy:    He has no cervical adenopathy.  Neurological: He is alert and  oriented to person, place, and time. No cranial nerve deficit.  Skin: Skin is warm and dry. No rash noted. No erythema.  Psychiatric: He has a normal mood and affect. His behavior is normal. Judgment and thought content normal.     Assessment/Plan This is an 83 year old male admitted for respiratory failure. 1.  Respiratory failure: Acute on chronic; with hypoxia.  Continue mechanical ventilation.  COVID-19 negative.  Continue antibiotics.     2.  COPD: End-stage; schedule albuterol treatments.  Consider steroids as well 3.  CAD: Troponin is elevated indicating that patient may have had a cardiac event although it could be due to demand ischemia.  Continue to follow cardiac enzymes. 4.  Hypertension: Hold antihypertensive agents at this time. Patient is currently hypotensive.  This could represent cardiogenic shock 5.  DVT prophylaxis: Lovenox 6.  GI prophylaxis: PPI after 24 hours intubation The patient is a full code.  However, he is on hospice care.  Treatment goals need to be revisited with family.  I have personally spent 45 minutes in critical care time with this patient.  Arnaldo Natal, MD 03/28/2019, 5:13 AM

## 2019-03-28 NOTE — ED Provider Notes (Signed)
[facility] Emergency Department Provider Note    First MD Initiated Contact with Patient 03/28/19 0140     (approximate)  I have reviewed the triage vital signs and the nursing notes.  Level 5 caveat: History review of system limited secondary to respiratory failure HISTORY  Chief Complaint unresponsive    HPI Dylan [name] is a 83 y.o. male with below list of previous medical conditions including COPD CHF MI currently under hospice care presents to the emergency department via EMS with [facility] airway in place secondary to respiratory failure.  EMS states on their arrival patient's oxygen saturation was 49%.  EMS states that the patient was cyanotic in appearance on their arrival.  While in route EMS states that the patient's respiratory status deteriorated requiring them to insert a King airway.       Past Medical History:  Diagnosis Date   CAD (coronary artery disease)    Cervicalgia    CHF (congestive heart failure) (HCC)    COPD (chronic obstructive pulmonary disease) (HCC)    Diastolic heart failure (HCC)    Foot drop, right    History of kidney stones    Hyperlipidemia    unspecified   Hypertension    Myocardial infarction [facility])    Osteoarthritis    Shoulder pain, left    Sleep apnea    Tremor, essential     Patient Active Problem List   Diagnosis Date Noted   Acute on chronic respiratory failure with hypoxemia ([place]) 03/28/2019   Acute respiratory failure with hypoxemia ([place]) 11/23/2018   Goals of care, counseling/discussion    Palliative care by specialist    DNR (do not resuscitate) discussion    Acute CHF (congestive heart failure) ([place]) 11/14/2018   Acute on chronic systolic CHF (congestive heart failure) ([place]) 10/27/2018   HCAP (healthcare-associated pneumonia) 10/27/2018   Hypokalemia 10/01/2018   Hypertensive heart and kidney disease with acute on chronic diastolic congestive heart failure and stage 3  chronic kidney disease ([place]) 09/09/2018   Chronic constipation 09/09/2018   BPH with obstruction/lower urinary tract symptoms 09/09/2018   Obstructive sleep apnea of adult 09/09/2018   Idiopathic peripheral neuropathy 09/09/2018   Chronic kidney disease, stage 3, mod decreased GFR ([place]) 09/09/2018   Anemia of chronic renal failure, stage 3 (moderate) ([place]) 09/09/2018   Periprosthetic fracture around internal prosthetic left knee joint 08/26/2018   Acute ST elevation myocardial infarction (STEMI) of inferior wall ([place]) 06/21/2018   S/P total knee arthroplasty 06/01/2018   Primary osteoarthritis of left knee 02/05/2018   Sepsis ([place]) 01/10/2018   Community acquired pneumonia 12/01/2017   Pneumonia 12/01/2017   Hyperlipidemia 09/23/2017   Weakness 08/31/2017   Benign essential tremor 10/18/2016   Class 3 severe obesity due to excess calories with serious comorbidity and body mass index (BMI) of 45.0 to 49.9 in adult ([place]) 10/18/2016   Varicose veins of lower extremities with ulcer ([place]) 06/17/2016   Chronic venous insufficiency 06/17/2016   Lymphedema 06/17/2016   Acute on chronic diastolic heart failure ([place]) 06/10/2016   COPD, severe ([place]) 05/24/2016   AV block, [place] 1 02/09/2016   Coronary atherosclerosis of native coronary artery 02/09/2016   ST elevation MI (STEMI) ([place]) 02/08/2016   Right foot drop 04/07/2014   Rotator cuff rupture, complete 04/22/2013   Neck pain 04/14/2013    Past Surgical History:  Procedure Laterality Date   APPLICATION OF WOUND VAC Right 11/13/2017   Procedure: APPLICATION OF WOUND VAC;  Surgeon: Lucky Cowboy,  Erskine Squibb, MD;  Location: [facility] ORS;  Service: Vascular;  Laterality: Right;   BACK SURGERY  06/2010   CARDIAC CATHETERIZATION Left 04/30/2016   Procedure: Left Heart Cath and Coronary Angiography;  Surgeon: Dionisio David, MD;  Location: [place] CV LAB;  Service: Cardiovascular;  Laterality: Left;   COLONOSCOPY      CORONARY ANGIOPLASTY     CORONARY/GRAFT ACUTE MI REVASCULARIZATION N/A 06/21/2018   Procedure: Coronary/Graft Acute MI Revascularization;  Surgeon: Wellington Hampshire, MD;  Location: [place] CV LAB;  Service: Cardiovascular;  Laterality: N/A;   KNEE ARTHROPLASTY Left 06/01/2018   Procedure: COMPUTER ASSISTED TOTAL KNEE ARTHROPLASTY;  Surgeon: Dereck Leep, MD;  Location: [facility] ORS;  Service: Orthopedics;  Laterality: Left;   KNEE ARTHROSCOPY Left    KNEE SURGERY Left 1998   LEFT HEART CATH AND CORONARY ANGIOGRAPHY N/A 06/21/2018   Procedure: LEFT HEART CATH AND CORONARY ANGIOGRAPHY;  Surgeon: Wellington Hampshire, MD;  Location: [place] CV LAB;  Service: Cardiovascular;  Laterality: N/A;   ORIF FEMUR FRACTURE Left 08/27/2018   Procedure: OPEN REDUCTION INTERNAL FIXATION (ORIF) SUPRACONDYLAR FEMUR FRACTURE ABOVE PROSTHESIS, QUADRICEPS REPAIR;  Surgeon: Hessie Knows, MD;  Location: [facility] ORS;  Service: Orthopedics;  Laterality: Left;   TEE WITHOUT CARDIOVERSION N/A 11/18/2018   Procedure: TRANSESOPHAGEAL ECHOCARDIOGRAM (TEE);  Surgeon: Corey Skains, MD;  Location: [facility] ORS;  Service: Cardiovascular;  Laterality: N/A;   TONSILLECTOMY     WOUND DEBRIDEMENT Right 11/13/2017   Procedure: DEBRIDEMENT WOUND;  Surgeon: Algernon Huxley, MD;  Location: [facility] ORS;  Service: Vascular;  Laterality: Right;    Prior to Admission medications   Medication Sig Start Date End Date Taking? Authorizing Provider  acetaminophen (TYLENOL) 500 MG tablet Take 1,000 mg by mouth 2 (two) times daily as needed for moderate pain.    [provider]  alum & mag hydroxide-simeth (MAALOX PLUS) 400-400-40 MG/5ML suspension Take 30 mLs by mouth every 4 (four) hours as needed for indigestion. 09/04/18   [provider]  Ascorbic Acid (VITAMIN C PO) Take 500 mg by mouth daily.     [provider]  aspirin EC 81 MG tablet Take 81 mg by mouth daily.    [provider]  atorvastatin  (LIPITOR) 80 MG tablet Take 80 mg by mouth daily.  09/04/18   [provider]  carvedilol (COREG) 6.25 MG tablet Take 6.25 mg by mouth 2 (two) times daily with a meal. 09/07/18   [provider]  Cholecalciferol (VITAMIN D-1000 MAX ST) 25 MCG (1000 UT) tablet Take 1 tablet (1,000 Units total) by mouth daily. 12/24/18   Alisa Graff, FNP  folic acid (V-R FOLIC ACID) 103 MCG tablet Take 1 tablet (400 mcg total) by mouth daily. 12/24/18   Alisa Graff, FNP  furosemide (LASIX) 40 MG tablet Take 40 mg by mouth 2 (two) times daily.    [provider]  gabapentin (NEURONTIN) 300 MG capsule Take 1 capsule (300 mg total) by mouth at bedtime. 12/24/18   Darylene Price A, FNP  ipratropium-albuterol (DUONEB) 0.5-2.5 (3) MG/3ML SOLN Take 3 mLs by nebulization every 6 (six) hours as needed (shortness of breath).     [provider]  isosorbide mononitrate (IMDUR) 30 MG 24 hr tablet Take 30 mg by mouth daily.  05/31/16   [provider]  ketotifen (ZADITOR) 0.025 % ophthalmic solution Place 1 drop into both eyes as needed. 12/24/18   Alisa Graff, FNP  loratadine (CLARITIN) 10  MG tablet Take 1 tablet (10 mg total) by mouth as needed for allergies. 12/24/18   Alisa Graff, FNP  magnesium hydroxide (MILK OF MAGNESIA) 400 MG/5ML suspension Take 30 mLs by mouth daily as needed for mild constipation.     [provider]  Multiple Vitamins-Minerals (CENTROVITE) TABS Take 1 tablet by mouth daily.    [provider]  nitroGLYCERIN (NITROSTAT) 0.3 MG SL tablet Place 0.3 mg under the tongue every 5 (five) minutes as needed for chest pain.    [provider]  pantoprazole (PROTONIX) 40 MG tablet Take 1 tablet (40 mg total) by mouth daily. 12/01/18   [name], Avel Peace, MD  polyethylene glycol (MIRALAX / GLYCOLAX) packet Take 17 g by mouth 2 (two) times daily as needed. Hold for loose stools. 09/29/18   [provider]  potassium chloride SA (K-DUR)  20 MEQ tablet Take 1 tablet (20 mEq total) by mouth daily. 12/24/18   Alisa Graff, FNP  primidone (MYSOLINE) 50 MG tablet Take 150 mg by mouth 2 (two) times daily.     [provider]  QUEtiapine (SEROQUEL) 50 MG tablet Take 1 tablet (50 mg total) by mouth at bedtime as needed. 12/24/18   Alisa Graff, FNP  sacubitril-valsartan (ENTRESTO) 24-26 MG Take 1 tablet by mouth every 12 (twelve) hours. 02/16/19   [provider]  sennosides-docusate sodium (SENOKOT-S) 8.6-50 MG tablet Take 2 tablets by mouth daily as needed for constipation. 12/24/18   Alisa Graff, FNP  spironolactone (ALDACTONE) 25 MG tablet Take 12.5 mg by mouth daily.  09/04/18   [provider]  traZODone (DESYREL) 50 MG tablet Take 50 mg by mouth at bedtime. 01/07/19 01/07/20  [provider]  umeclidinium-vilanterol (ANORO ELLIPTA) 62.5-25 MCG/INH AEPB Inhale 1 puff into the lungs as needed. 12/24/18   Alisa Graff, FNP    Allergies Patient has no known allergies.  Family History  Problem Relation Age of Onset   Diabetes Son    Cancer Mother    Colon cancer Mother    Lung cancer Mother    Tremor Father    Heart disease Father    Tremor Brother    Bladder Cancer Brother    Tremor Sister     Social History Social History   Tobacco Use   Smoking status: Former Smoker    Packs/day: 0.25    Years: 58.00    Pack years: 14.50    Types: Cigarettes   Smokeless tobacco: Former Systems developer   Tobacco comment: quit the begginning of septembet 2019  Substance Use Topics   Alcohol use: No    Alcohol/week: 0.0 standard drinks   Drug use: No    Review of Systems Constitutional: No fever/chills Eyes: No visual changes. ENT: No sore throat. Cardiovascular: Denies chest pain. Respiratory: Positive for shortness of breath. Gastrointestinal: No abdominal pain.  No nausea, no vomiting.  No diarrhea.  No constipation. Genitourinary: Negative for dysuria. Musculoskeletal: Negative  for neck pain.  Negative for back pain. Integumentary: Negative for rash. Neurological: Negative for headaches, focal weakness or numbness.  ____________________________________________   PHYSICAL EXAM:  VITAL SIGNS: ED Triage Vitals  Enc Vitals Group     BP 03/28/19 0156 (!) 191/160     Pulse Rate 03/28/19 0156 65     Resp 03/28/19 0156 20     Temp --      Temp src --      SpO2 03/28/19 0149 100 %  Weight 03/28/19 0153 136.1 kg (300 lb)     Height 03/28/19 0153 1.803 m (_0 )     Head Circumference --      Peak Flow --      Pain Score --      Pain Loc --      Pain Edu? --      Excl. in [place]? --     Constitutional: Unresponsive King airway in place. Eyes: Conjunctivae are normal.  Head: Atraumatic. Mouth/Throat: Mucous membranes are moist. Oropharynx non-erythematous. Neck: No stridor.  Thick yellowish-white secretions noted within [facility] airway/ mouth Cardiovascular: Normal rate, regular rhythm. Good peripheral circulation. Grossly normal heart sounds. Respiratory: Diffuse rhonchi on auscultation, Gastrointestinal: Soft and nontender. No distention.  Musculoskeletal: No lower extremity tenderness nor edema. No gross deformities of extremities. Neurologic:   No gross focal neurologic deficits are appreciated.  Skin:  Skin is warm, dry and intact. No rash noted.   ____________________________________________   LABS (all labs ordered are listed, but only abnormal results are displayed)  Labs Reviewed  LACTIC ACID, PLASMA - Abnormal; Notable for the following components:      Result Value   Lactic Acid, Venous 4.0 (*)    All other components within normal limits  LACTIC ACID, PLASMA - Abnormal; Notable for the following components:   Lactic Acid, Venous 2.6 (*)    All other components within normal limits  COMPREHENSIVE METABOLIC PANEL - Abnormal; Notable for the following components:   Glucose, Bld 188 (*)    BUN 34 (*)    Calcium 8.4 (*)    Total Protein 6.3  (*)    Albumin 2.5 (*)    AST 49 (*)    Alkaline Phosphatase 148 (*)    GFR calc non Af Amer 60 (*)    All other components within normal limits  CBC WITH DIFFERENTIAL/PLATELET - Abnormal; Notable for the following components:   WBC 10.9 (*)    RBC 3.56 (*)    Hemoglobin 10.9 (*)    HCT 36.9 (*)    MCV 103.7 (*)    MCHC 29.5 (*)    RDW 17.2 (*)    Neutro Abs 9.3 (*)    Lymphs Abs 0.6 (*)    Abs Immature Granulocytes 0.22 (*)    All other components within normal limits  URINALYSIS, ROUTINE W REFLEX MICROSCOPIC - Abnormal; Notable for the following components:   Color, Urine YELLOW (*)    APPearance CLOUDY (*)    Hgb urine dipstick MODERATE (*)    Protein, ur 30 (*)    Leukocytes,Ua MODERATE (*)    RBC / HPF >50 (*)    WBC, UA >50 (*)    Bacteria, UA MANY (*)    All other components within normal limits  BLOOD GAS, ARTERIAL - Abnormal; Notable for the following components:   pH, Arterial 7.49 (*)    Bicarbonate 35.1 (*)    Acid-Base Excess 10.3 (*)    All other components within normal limits  TROPONIN I (HIGH SENSITIVITY) - Abnormal; Notable for the following components:   Troponin I (High Sensitivity) 229 (*)    All other components within normal limits  SARS CORONAVIRUS 2 (HOSPITAL ORDER, [place] [facility])  CULTURE, BLOOD (ROUTINE X 2)  CULTURE, BLOOD (ROUTINE X 2)  URINE CULTURE  APTT  PROTIME-INR   ____________________________________________  EKG  ED ECG REPORT I, [place] N Slayter [place], the attending physician, personally viewed and interpreted this ECG.   Date:  03/28/2019  EKG Time: 2:14 AM  Rate: 48  Rhythm: Sinus bradycardia  Axis: Normal  Intervals: Prolonged PR interval  ST&T Change: None ______________________________________  RADIOLOGY I, [place] Gianella Chismar, personally viewed and evaluated these images (plain radiographs) as part of my medical decision making, as well as reviewing the written report by the radiologist.  ED MD  interpretation: Endotracheal tube in good position per radiologist on chest x-ray interpretation  Official radiology report(s): Dg Chest Port 1 View  Result Date: 03/28/2019 CLINICAL DATA:  Intubation EXAM: PORTABLE CHEST 1 VIEW COMPARISON:  02/23/2019 FINDINGS: The endotracheal tube tip just below the level of clavicular heads. Orogastric tube side port projects over the gastric body. Patient is markedly rotated to the left, obscuring most of the left hemithorax. Visualized right lung is clear. IMPRESSION: Radiographically appropriate position of endotracheal 2. Electronically Signed   By: Ulyses Jarred M.D.   On: 03/28/2019 02:34    ____________________________________________   PROCEDURES    .Critical Care Performed by: Gregor Hams, MD Authorized by: Gregor Hams, MD   Critical care provider statement:    Critical care time (minutes):  60   Critical care time was exclusive of:  Separately billable procedures and treating other patients   Critical care was necessary to treat or prevent imminent or life-threatening deterioration of the following conditions:  Sepsis and respiratory failure   Critical care was time spent personally by me on the following activities:  Development of treatment plan with patient or surrogate, discussions with consultants, evaluation of patient's response to treatment, examination of patient, obtaining history from patient or surrogate, ordering and performing treatments and interventions, ordering and review of laboratory studies, ordering and review of radiographic studies, pulse oximetry, re-evaluation of patient's condition and review of old charts   I assumed direction of critical care for this patient from another provider in my specialty: no    Procedure Name: Intubation Date/Time: 03/29/2019 4:27 AM Performed by: Gregor Hams, MD Pre-anesthesia Checklist: Patient identified, Emergency Drugs available, Suction available and Patient being  monitored Preoxygenation: Pre-oxygenation with 100% oxygen Induction Type: IV induction and Rapid sequence Laryngoscope Size: Mac and 4 Tube size: 7.5 mm Number of attempts: 1 Placement Confirmation: ETT inserted through vocal cords under direct vision,  CO2 detector and Breath sounds checked- equal and bilateral Secured at: 24 cm Tube secured with: ETT holder Dental Injury: Teeth and Oropharynx as per pre-operative assessment  Difficulty Due To: Difficulty was anticipated        ____________________________________________   INITIAL IMPRESSION / MDM / [place] / ED COURSE  As part of my medical decision making, I reviewed the following data within the electronic MEDICAL RECORD NUMBER  83 year old male presenting with above-stated history and physical exam secondary to respiratory failure.  Patient presents to the emergency department with a King airway in place which was replaced with an endotracheal tube.  Patient hypotensive and as such required Levophed.  Blood pressure improving on Levophed at present.  Concern for possible sepsis as etiology for the patient's symptoms and as such sepsis protocol was initiated with appropriate IV antibiotics initiated.  Patient discussed with Dr. Jannifer Franklin for hospital admission further evaluation and management.  ____________________________________________  FINAL CLINICAL IMPRESSION(S) / ED DIAGNOSES Sepsis Acute respiratory failure   MEDICATIONS GIVEN DURING THIS VISIT:  Medications  cefTRIAXone (ROCEPHIN) 2 g in sodium chloride 0.9 % 100 mL IVPB (0 g Intravenous Stopped 03/28/19 0245)  azithromycin (ZITHROMAX) 500 mg in sodium chloride 0.9 %  250 mL IVPB (500 mg Intravenous New Bag/Given 03/28/19 0351)  propofol (DIPRIVAN) 1000 MG/100ML infusion (10 mcg/kg/min  136.1 kg Intravenous New Bag/Given 03/28/19 0402)  norepinephrine (LEVOPHED) 39m in 2566mpremix infusion (10 mcg/min Intravenous New Bag/Given 03/28/19 0347)  rocuronium  (ZEMURON) injection (10 mg Intravenous Given 03/28/19 0152)  sodium chloride 0.9 % bolus 1,000 mL (1,000 mLs Intravenous New Bag/Given 03/28/19 0200)     ED Discharge Orders    None      *Please note:  JeDRAY DENTEas evaluated in Emergency Department on 03/28/2019 for the symptoms described in the history of present illness. He was evaluated in the context of the global COVID-19 pandemic, which necessitated consideration that the patient might be at risk for infection with the SARS-CoV-2 virus that causes COVID-19. Institutional protocols and algorithms that pertain to the evaluation of patients at risk for COVID-19 are in a state of rapid change based on information released by regulatory bodies including the CDC and federal and state organizations. These policies and algorithms were followed during the patient's care in the ED.  Some ED evaluations and interventions may be delayed as a result of limited staffing during the pandemic.*  Note:  This document was prepared using Dragon voice recognition software and may include unintentional dictation errors.   BrGregor HamsMD 03/29/19 [phone]

## 2019-03-29 ENCOUNTER — Inpatient Hospital Stay

## 2019-03-29 ENCOUNTER — Other Ambulatory Visit: Payer: Self-pay

## 2019-03-29 LAB — COMPREHENSIVE METABOLIC PANEL
ALT: 23 U/L (ref 0–44)
AST: 37 U/L (ref 15–41)
Albumin: 2.4 g/dL — ABNORMAL LOW (ref 3.5–5.0)
Alkaline Phosphatase: 107 U/L (ref 38–126)
Anion gap: 10 (ref 5–15)
BUN: 34 mg/dL — ABNORMAL HIGH (ref 8–23)
CO2: 31 mmol/L (ref 22–32)
Calcium: 8.4 mg/dL — ABNORMAL LOW (ref 8.9–10.3)
Chloride: 105 mmol/L (ref 98–111)
Creatinine, Ser: 1.14 mg/dL (ref 0.61–1.24)
GFR calc Af Amer: >60 mL/min (ref >60)
GFR calc non Af Amer: 60 mL/min — ABNORMAL LOW (ref >60)
Glucose, Bld: 99 mg/dL (ref 70–99)
Potassium: 3.6 mmol/L (ref 3.5–5.1)
Sodium: 146 mmol/L — ABNORMAL HIGH (ref 135–145)
Total Bilirubin: 0.9 mg/dL (ref 0.3–1.2)
Total Protein: 5.7 g/dL — ABNORMAL LOW (ref 6.5–8.1)

## 2019-03-29 LAB — CBC WITH DIFFERENTIAL/PLATELET
Abs Immature Granulocytes: 0.07 10*3/uL (ref 0.00–0.07)
Basophils Absolute: 0 10*3/uL (ref 0.0–0.1)
Basophils Relative: 0 %
Eosinophils Absolute: 0.1 10*3/uL (ref 0.0–0.5)
Eosinophils Relative: 1 %
HCT: 33.8 % — ABNORMAL LOW (ref 39.0–52.0)
Hemoglobin: 10.4 g/dL — ABNORMAL LOW (ref 13.0–17.0)
Immature Granulocytes: 1 %
Lymphocytes Relative: 17 %
Lymphs Abs: 1.6 10*3/uL (ref 0.7–4.0)
MCH: 30.7 pg (ref 26.0–34.0)
MCHC: 30.8 g/dL (ref 30.0–36.0)
MCV: 99.7 fL (ref 80.0–100.0)
Monocytes Absolute: 0.6 10*3/uL (ref 0.1–1.0)
Monocytes Relative: 6 %
Neutro Abs: 7 10*3/uL (ref 1.7–7.7)
Neutrophils Relative %: 75 %
Platelets: 166 10*3/uL (ref 150–400)
RBC: 3.39 MIL/uL — ABNORMAL LOW (ref 4.22–5.81)
RDW: 17.6 % — ABNORMAL HIGH (ref 11.5–15.5)
WBC: 9.4 10*3/uL (ref 4.0–10.5)
nRBC: 0 % (ref 0.0–0.2)

## 2019-03-29 LAB — TROPONIN I (HIGH SENSITIVITY)
Troponin I (High Sensitivity): 434 ng/L (ref <18)
Troponin I (High Sensitivity): 385 ng/L (ref <18)

## 2019-03-29 LAB — PROCALCITONIN: Procalcitonin: 0.26 ng/mL

## 2019-03-29 LAB — GLUCOSE, CAPILLARY
Glucose-Capillary: 84 mg/dL (ref 70–99)
Glucose-Capillary: 80 mg/dL (ref 70–99)
Glucose-Capillary: 83 mg/dL (ref 70–99)

## 2019-03-29 LAB — PHOSPHORUS: Phosphorus: 3.1 mg/dL (ref 2.5–4.6)

## 2019-03-29 LAB — MAGNESIUM: Magnesium: 2.5 mg/dL — ABNORMAL HIGH (ref 1.7–2.4)

## 2019-03-29 MED ORDER — FUROSEMIDE 10 MG/ML IJ SOLN
40.0000 mg | Freq: Once | INTRAMUSCULAR | Status: AC
  Administered 2019-03-29: 40 mg via INTRAVENOUS
  Filled 2019-03-29: qty 4

## 2019-03-29 MED ORDER — CHLORHEXIDINE GLUCONATE 0.12 % MT SOLN: 15.0000 mL | Freq: Two times a day (BID) | OROMUCOSAL | Status: DC

## 2019-03-29 MED ORDER — DOCUSATE SODIUM 50 MG/5ML PO LIQD: 100.0000 mg | Freq: Two times a day (BID) | ORAL | Status: DC

## 2019-03-29 MED ORDER — PANTOPRAZOLE SODIUM 40 MG PO TBEC
40.0000 mg | DELAYED_RELEASE_TABLET | Freq: Every day | ORAL | Status: DC
  Administered 2019-03-30 – 2019-04-02 (×4): 40 mg via ORAL
  Filled 2019-03-29 (×4): qty 1

## 2019-03-29 MED ORDER — PANTOPRAZOLE SODIUM 40 MG PO PACK: 40.0000 mg | PACK | Freq: Every day | ORAL | Status: DC

## 2019-03-29 MED ORDER — IPRATROPIUM-ALBUTEROL 0.5-2.5 (3) MG/3ML IN SOLN
3.0000 mL | Freq: Four times a day (QID) | RESPIRATORY_TRACT | Status: DC
  Administered 2019-03-29 – 2019-03-31 (×7): 3 mL via RESPIRATORY_TRACT
  Filled 2019-03-29 (×7): qty 3

## 2019-03-29 MED ORDER — SODIUM CHLORIDE 0.9 % IV SOLN
1.0000 g | INTRAVENOUS | Status: DC
  Administered 2019-03-29 – 2019-03-31 (×3): 1 g via INTRAVENOUS
  Filled 2019-03-29: qty 1
  Filled 2019-03-29: qty 10
  Filled 2019-03-29 (×2): qty 1

## 2019-03-29 MED ORDER — ORAL CARE MOUTH RINSE: 15.0000 mL | Freq: Two times a day (BID) | OROMUCOSAL | Status: DC

## 2019-03-29 MED ORDER — FUROSEMIDE 10 MG/ML IJ SOLN
20.0000 mg | Freq: Two times a day (BID) | INTRAMUSCULAR | Status: DC
  Administered 2019-03-29 – 2019-03-31 (×4): 20 mg via INTRAVENOUS
  Filled 2019-03-29 (×4): qty 2

## 2019-03-29 MED ORDER — POTASSIUM CHLORIDE 10 MEQ/100ML IV SOLN
10.0000 meq | INTRAVENOUS | Status: AC
  Administered 2019-03-29 (×3): 10 meq via INTRAVENOUS
  Filled 2019-03-29 (×3): qty 100

## 2019-03-29 NOTE — Progress Notes (Signed)
Patient extubated at this time. Placed on heated high flow [place]. Daughter is at bedside. Pt follows simple commands but is somewhat restless and seems confused at times. Unable to answer any questions at this time. VS stable at this time.

## 2019-03-29 NOTE — Consult Note (Signed)
[facility]  CARDIOLOGY CONSULT NOTE  Patient ID: Dylan [name] MRN: [id number] DOB/AGE: October 19, 1935 83 y.o.  Admit date: 03/28/2019 Referring Physician Sheryle Hailiamond Primary Physician  Primary Cardiologist [facility] Reason for Consultation Elevated troponin  HPI: 83 year old male referred for evaluation of elevated troponin. 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 The patient was [facility] ER after being found minimally responsive at home with SPO2 40-50%. He was intubated in the ER. Admission labs notable for negative COVID-19, initial hs-troponin 229, BNP 782, lactic acid 2.6, TSH 49.284. ECG revealed sinus bradycardia at a rate of 49 bpm with nonspecific T wave abnormalities. 2D echocardiogram 02/01/2019 revealed LVEF 55-60%, however several previous echocardiograms, including 11/18/2018 revealed LVEF 35-40% with global hypokinesis. Currently, the patient is sedated and on mechanical ventilator; norepinephrine discontinued. Heart rate is currently in the 90s. The patient's daughter-in-law at the bedside states that the patient has not complained of chest pain recently.    Review of systems unable to be completed to due patient's sedation.  Past Medical History:  Diagnosis Date  . CAD (coronary artery disease)   . Cervicalgia   . CHF (congestive heart failure) (HCC)   . COPD (chronic obstructive pulmonary disease) (HCC)   . Diastolic heart failure (HCC)   . Foot drop, right   . History of kidney stones   . Hyperlipidemia    unspecified  . Hypertension   . Myocardial infarction (HCC)   . Osteoarthritis   . Shoulder pain, left   . Sleep apnea   . Tremor,  essential     Past Surgical History:  Procedure Laterality Date  . APPLICATION OF WOUND VAC Right 11/13/2017   Procedure: APPLICATION OF WOUND VAC;  Surgeon: Annice Needyew, Jason S, MD;  Location: [facility] ORS;  Service: Vascular;  Laterality: Right;  . BACK SURGERY  06/2010  . CARDIAC CATHETERIZATION Left 04/30/2016   Procedure: Left Heart Cath and Coronary Angiography;  Surgeon: Laurier NancyShaukat A Khan, MD;  Location: [facility] INVASIVE CV LAB;  Service: Cardiovascular;  Laterality: Left;  . COLONOSCOPY    . CORONARY ANGIOPLASTY    . CORONARY/GRAFT ACUTE MI REVASCULARIZATION N/A 06/21/2018   Procedure: Coronary/Graft Acute MI Revascularization;  Surgeon: Iran OuchArida, Muhammad A, MD;  Location: [facility] INVASIVE CV LAB;  Service: Cardiovascular;  Laterality: N/A;  . KNEE ARTHROPLASTY Left 06/01/2018   Procedure: COMPUTER ASSISTED TOTAL KNEE ARTHROPLASTY;  Surgeon: Donato HeinzHooten, James P, MD;  Location: [facility] ORS;  Service: Orthopedics;  Laterality: Left;  . KNEE ARTHROSCOPY Left   . KNEE SURGERY Left 1998  . LEFT HEART CATH AND CORONARY ANGIOGRAPHY N/A 06/21/2018   Procedure: LEFT HEART CATH AND CORONARY ANGIOGRAPHY;  Surgeon: Iran OuchArida, Muhammad A, MD;  Location: [facility] INVASIVE CV LAB;  Service: Cardiovascular;  Laterality: N/A;  . ORIF FEMUR FRACTURE Left 08/27/2018   Procedure: OPEN REDUCTION INTERNAL FIXATION (ORIF) SUPRACONDYLAR FEMUR FRACTURE ABOVE PROSTHESIS, QUADRICEPS REPAIR;  Surgeon: Kennedy BuckerMenz, Michael, MD;  Location: [facility] ORS;  Service: Orthopedics;  Laterality: Left;  . TEE WITHOUT CARDIOVERSION N/A 11/18/2018   Procedure: TRANSESOPHAGEAL ECHOCARDIOGRAM (TEE);  Surgeon: Lamar BlinksKowalski, Bruce J, MD;  Location: [facility] ORS;  Service: Cardiovascular;  Laterality: N/A;  . TONSILLECTOMY    . WOUND DEBRIDEMENT Right 11/13/2017   Procedure: DEBRIDEMENT  WOUND;  Surgeon: Annice Needyew, Jason S, MD;  Location: [facility] ORS;  Service: Vascular;  Laterality: Right;    Medications Prior to Admission  Medication Sig Dispense Refill Last Dose  . acetaminophen (TYLENOL)  500 MG tablet Take 1,000 mg by mouth 2 (two) times daily as needed for moderate pain.     Marland Kitchen. alum & mag hydroxide-simeth (MAALOX PLUS) 400-400-40 MG/5ML suspension Take 30 mLs by mouth every 4 (four) hours as needed for indigestion.     . Ascorbic Acid (VITAMIN C PO) Take 500 mg by mouth daily.      Marland Kitchen. aspirin EC 81 MG tablet Take 81 mg by mouth daily.     Marland Kitchen. atorvastatin (LIPITOR) 80 MG tablet Take 80 mg by mouth daily.      . carvedilol (COREG) 6.25 MG tablet Take 6.25 mg by mouth 2 (two) times daily with a meal.     . Cholecalciferol (VITAMIN D-1000 MAX ST) 25 MCG (1000 UT) tablet Take 1 tablet (1,000 Units total) by mouth daily.     . folic acid (V-R FOLIC ACID) 400 MCG tablet Take 1 tablet (400 mcg total) by mouth daily.     . furosemide (LASIX) 40 MG tablet Take 40 mg by mouth 2 (two) times daily.     Marland Kitchen. gabapentin (NEURONTIN) 300 MG capsule Take 1 capsule (300 mg total) by mouth at bedtime.     Marland Kitchen. ipratropium-albuterol (DUONEB) 0.5-2.5 (3) MG/3ML SOLN Take 3 mLs by nebulization every 6 (six) hours as needed (shortness of breath).      . isosorbide mononitrate (IMDUR) 30 MG 24 hr tablet Take 30 mg by mouth daily.      Marland Kitchen. ketotifen (ZADITOR) 0.025 % ophthalmic solution Place 1 drop into both eyes as needed. 5 mL 0   . loratadine (CLARITIN) 10 MG tablet Take 1 tablet (10 mg total) by mouth as needed for allergies.     . magnesium hydroxide (MILK OF MAGNESIA) 400 MG/5ML suspension Take 30 mLs by mouth daily as needed for mild constipation.      . Multiple Vitamins-Minerals (CENTROVITE) TABS Take 1 tablet by mouth daily.     . nitroGLYCERIN (NITROSTAT) 0.3 MG SL tablet Place 0.3 mg under the tongue every 5 (five) minutes as needed for chest pain.     . pantoprazole (PROTONIX) 40 MG tablet Take 1 tablet (40 mg total) by mouth daily. 30 tablet 0   . polyethylene glycol (MIRALAX / GLYCOLAX) packet Take 17 g by mouth 2 (two) times daily as needed. Hold for loose stools.     . potassium chloride SA (K-DUR)  20 MEQ tablet Take 1 tablet (20 mEq total) by mouth daily.     . primidone (MYSOLINE) 50 MG tablet Take 150 mg by mouth 2 (two) times daily.      . QUEtiapine (SEROQUEL) 50 MG tablet Take 1 tablet (50 mg total) by mouth at bedtime as needed. 30 tablet 0   . sacubitril-valsartan (ENTRESTO) 24-26 MG Take 1 tablet by mouth every 12 (twelve) hours.     . sennosides-docusate sodium (SENOKOT-S) 8.6-50 MG tablet Take 2 tablets by mouth daily as needed for constipation.     Marland Kitchen. spironolactone (ALDACTONE) 25 MG tablet Take 12.5 mg by mouth daily.      . traZODone (DESYREL) 50 MG tablet Take 50 mg by mouth at bedtime.     Marland Kitchen. umeclidinium-vilanterol (ANORO ELLIPTA) 62.5-25 MCG/INH AEPB Inhale 1 puff into the lungs as needed.      Social  History   Socioeconomic History  . Marital status: Married    Spouse name: [name]  . Number of children: 2  . Years of education: 42  . Highest education level: 11th grade  Occupational History  . Occupation: retired  Scientific laboratory technician  . Financial resource strain: Not on file  . Food insecurity    Worry: Not on file    Inability: Not on file  . Transportation needs    Medical: Not on file    Non-medical: Not on file  Tobacco Use  . Smoking status: Former Smoker    Packs/day: 0.25    Years: 58.00    Pack years: 14.50    Types: Cigarettes  . Smokeless tobacco: Former Systems developer  . Tobacco comment: quit the begginning of septembet 2019  Substance and Sexual Activity  . Alcohol use: No    Alcohol/week: 0.0 standard drinks  . Drug use: No  . Sexual activity: Not on file  Lifestyle  . Physical activity    Days per week: Not on file    Minutes per session: Not on file  . Stress: Not on file  Relationships  . Social Herbalist on phone: Not on file    Gets together: Not on file    Attends religious service: Not on file    Active member of club or organization: Not on file    Attends meetings of clubs or organizations: Not on file    Relationship status:  Not on file  . Intimate partner violence    Fear of current or ex partner: Not on file    Emotionally abused: Not on file    Physically abused: Not on file    Forced sexual activity: Not on file  Other Topics Concern  . Not on file  Social History Narrative   Full Code with HCPOA and living will   Married with 2 children   Former smoker   Alcohol - none   Smokeless tobacco - none    Family History  Problem Relation Age of Onset  . Diabetes Son   . Cancer Mother   . Colon cancer Mother   . Lung cancer Mother   . Tremor Father   . Heart disease Father   . Tremor Brother   . Bladder Cancer Brother   . Tremor Sister       Review of systems unable to be completed due to patient sedation.     PHYSICAL EXAM  General: critically ill appearing, intubated, sedated, on mechanical ventilator, in no acute distress. HEENT:  Normocephalic and atramatic Neck:  No JVD.  Lungs: normal respirations on ventilator Heart: HRRR . Normal S1 and S2 without gallops or murmurs.  Abdomen: nondistended Msk: no obvious abnormality Extremities: No clubbing, cyanosis or edema.   Neuro: Sedated Psych:  Sedated  Labs:   Lab Results  Component Value Date   WBC 9.4 03/29/2019   HGB 10.4 (L) 03/29/2019   HCT 33.8 (L) 03/29/2019   MCV 99.7 03/29/2019   PLT 166 03/29/2019    Recent Labs  Lab 03/29/19 0519  NA 146*  K 3.6  CL 105  CO2 31  BUN 34*  CREATININE 1.14  CALCIUM 8.4*  PROT 5.7*  BILITOT 0.9  ALKPHOS 107  ALT 23  AST 37  GLUCOSE 99   Lab Results  Component Value Date   TROPONINI 0.08 ([place]) 01/31/2019    Lab Results  Component Value Date   CHOL 92 02/02/2019  CHOL 100 09/08/2017   Lab Results  Component Value Date   HDL 34 (L) 02/02/2019   HDL 34 (L) 09/08/2017   Lab Results  Component Value Date   LDLCALC 48 02/02/2019   LDLCALC 54 09/08/2017   Lab Results  Component Value Date   TRIG 52 02/02/2019   TRIG 61 09/08/2017   Lab Results  Component Value  Date   CHOLHDL 2.7 02/02/2019   CHOLHDL 2.9 09/08/2017   No results found for: LDLDIRECT    Radiology: Dg Chest Port 1 View  Result Date: 03/28/2019 CLINICAL DATA:  Intubation EXAM: PORTABLE CHEST 1 VIEW COMPARISON:  02/23/2019 FINDINGS: The endotracheal tube tip just below the level of clavicular heads. Orogastric tube side port projects over the gastric body. Patient is markedly rotated to the left, obscuring most of the left hemithorax. Visualized right lung is clear. IMPRESSION: Radiographically appropriate position of endotracheal 2. Electronically Signed   By: Deatra RobinsonKevin  Herman M.D.   On: 03/28/2019 02:34    EKG: sinus rhythm, 90s  ASSESSMENT AND PLAN:  1. Elevated troponin in the setting of acute on chronic hypoxic respiratory failure in a patient with end-stage COPD on hospice. Initial troponin borderline elevated to 229, likely demand supply ischemia. ECG with nonspecific T wave abnormalities. Will obtain repeat troponin now to assess delta. 2. Acute on chronic hypoxic respiratory failure, on mechanical ventilator, with multiple admissions recently for the same. 3. Coronary artery disease, status post inferior MI in 06/2018, status post 2 overlapping DES in RCA, on aspirin only due to history of brain bleed. 4. Acute on chronic systolic CHF, BNP 782, on IV Lasix 20 mg BID  Plan: 1. Repeat hs-troponin, assess delta 2. Defer repeating 2D echocardiogram as patient had one last month 3. Hold Entresto; consider resuming carvedilol if blood pressure permits 4. Continue IV Lasix 20 mg BID with careful monitoring of renal status and electrolytes 5. Further recommendations pending patient's initial course.  Signed: Leanora Ivanoffnna Fleta Borgeson PA-C 03/29/2019, 9:11 AM

## 2019-03-29 NOTE — Progress Notes (Signed)
PHARMACIST - PHYSICIAN COMMUNICATION  CONCERNING: IV to Oral Route Change Policy  RECOMMENDATION: This patient is receiving pantoprazole by the intravenous route.  Based on criteria approved by the Pharmacy and Therapeutics Committee, the intravenous medication(s) is/are being converted to the equivalent oral dose form(s).   DESCRIPTION: These criteria include:  The patient is eating (either orally or via tube) and/or has been taking other orally administered medications for a least 24 hours  The patient has no evidence of active gastrointestinal bleeding or impaired GI absorption (gastrectomy, short bowel, patient on TNA or NPO).  If you have questions about this conversion, please contact the Pharmacy Department   []   [phone] )  Dylan [name], [facility] 03/29/2019 9:10 AM

## 2019-03-29 NOTE — Progress Notes (Signed)
CRITICAL CARE NOTE      CHIEF COMPLAINT:   Acute hypoxemic respiratory failure   SUBJECTIVE FINDINGS & SIGNIFICANT EVENTS    83 year old male with acute exacerbation of CHF likely due to UTI, was intubated due to severe hypoxemia.  -Patient had passed weaning trial today and is status post successful liberation from mechanical ventilation.   -Urine culture positive for E. coli and blood culture positive for methicillin-resistant staph epidermidis.  Patient is currently on vancomycin and Rocephin    PAST MEDICAL HISTORY   Past Medical History:  Diagnosis Date  . CAD (coronary artery disease)   . Cervicalgia   . CHF (congestive heart failure) ([place])   . COPD (chronic obstructive pulmonary disease) ([place])   . Diastolic heart failure ([place])   . Foot drop, right   . History of kidney stones   . Hyperlipidemia    unspecified  . Hypertension   . Myocardial infarction ([place])   . Osteoarthritis   . Shoulder pain, left   . Sleep apnea   . Tremor, essential      SURGICAL HISTORY   Past Surgical History:  Procedure Laterality Date  . APPLICATION OF WOUND VAC Right 11/13/2017   Procedure: APPLICATION OF WOUND VAC;  Surgeon: Algernon Huxley, MD;  Location: [facility] ORS;  Service: Vascular;  Laterality: Right;  . BACK SURGERY  06/2010  . CARDIAC CATHETERIZATION Left 04/30/2016   Procedure: Left Heart Cath and Coronary Angiography;  Surgeon: Dionisio David, MD;  Location: [place] CV LAB;  Service: Cardiovascular;  Laterality: Left;  . COLONOSCOPY    . CORONARY ANGIOPLASTY    . CORONARY/GRAFT ACUTE MI REVASCULARIZATION N/A 06/21/2018   Procedure: Coronary/Graft Acute MI Revascularization;  Surgeon: Wellington Hampshire, MD;  Location: [place] CV LAB;  Service: Cardiovascular;  Laterality: N/A;  . KNEE  ARTHROPLASTY Left 06/01/2018   Procedure: COMPUTER ASSISTED TOTAL KNEE ARTHROPLASTY;  Surgeon: Dereck Leep, MD;  Location: [facility] ORS;  Service: Orthopedics;  Laterality: Left;  . KNEE ARTHROSCOPY Left   . KNEE SURGERY Left 1998  . LEFT HEART CATH AND CORONARY ANGIOGRAPHY N/A 06/21/2018   Procedure: LEFT HEART CATH AND CORONARY ANGIOGRAPHY;  Surgeon: Wellington Hampshire, MD;  Location: [place] CV LAB;  Service: Cardiovascular;  Laterality: N/A;  . ORIF FEMUR FRACTURE Left 08/27/2018   Procedure: OPEN REDUCTION INTERNAL FIXATION (ORIF) SUPRACONDYLAR FEMUR FRACTURE ABOVE PROSTHESIS, QUADRICEPS REPAIR;  Surgeon: Hessie Knows, MD;  Location: [facility] ORS;  Service: Orthopedics;  Laterality: Left;  . TEE WITHOUT CARDIOVERSION N/A 11/18/2018   Procedure: TRANSESOPHAGEAL ECHOCARDIOGRAM (TEE);  Surgeon: Corey Skains, MD;  Location: [facility] ORS;  Service: Cardiovascular;  Laterality: N/A;  . TONSILLECTOMY    . WOUND DEBRIDEMENT Right 11/13/2017   Procedure: DEBRIDEMENT WOUND;  Surgeon: Algernon Huxley, MD;  Location: [facility] ORS;  Service: Vascular;  Laterality: Right;     FAMILY HISTORY   Family History  Problem Relation Age of Onset  . Diabetes Son   . Cancer Mother   . Colon cancer Mother   . Lung cancer Mother   . Tremor Father   . Heart disease Father   . Tremor Brother   . Bladder Cancer Brother   . Tremor Sister      SOCIAL HISTORY   Social History   Tobacco Use  . Smoking status: Former Smoker    Packs/day: 0.25    Years: 58.00    Pack years: 14.50    Types: Cigarettes  . Smokeless tobacco:  Former Systems developer  . Tobacco comment: quit the begginning of septembet 2019  Substance Use Topics  . Alcohol use: No    Alcohol/week: 0.0 standard drinks  . Drug use: No     MEDICATIONS   Current Medication:  Current Facility-Administered Medications:  .  0.9 %  sodium chloride infusion, , Intravenous, PRN, Harrie Foreman, MD, Last Rate: 5 mL/hr at 03/29/19 0824 .  acetaminophen  (TYLENOL) tablet 650 mg, 650 mg, Oral, Q6H PRN **OR** acetaminophen (TYLENOL) suppository 650 mg, 650 mg, Rectal, Q6H PRN, Harrie Foreman, MD .  cefTRIAXone (ROCEPHIN) 1 g in sodium chloride 0.9 % 100 mL IVPB, 1 g, Intravenous, Q24H, Peggie Hornak, MD, Last Rate: 200 mL/hr at 03/29/19 0952, 1 g at 03/29/19 0952 .  chlorhexidine gluconate (MEDLINE KIT) (PERIDEX) 0.12 % solution 15 mL, 15 mL, Mouth Rinse, BID, Lanney Gins, Preslei Blakley, MD, 15 mL at 03/29/19 0753 .  docusate (COLACE) 50 MG/5ML liquid 100 mg, 100 mg, Per Tube, BID, Charlett Nose, RPH .  enoxaparin (LOVENOX) injection 40 mg, 40 mg, Subcutaneous, Q12H, Harrie Foreman, MD, 40 mg at 03/29/19 0901 .  fentaNYL (SUBLIMAZE) bolus via infusion 25 mcg, 25 mcg, Intravenous, Q15 min PRN, Tukov-Yual, Magdalene S, NP, 25 mcg at 03/29/19 0759 .  fentaNYL 2517mg in NS 2510m(1047mml) infusion-PREMIX, 25-200 mcg/hr, Intravenous, Continuous, Tukov-Yual, Magdalene S, NP, Last Rate: 20 mL/hr at 03/29/19 0824, 200 mcg/hr at 03/29/19 0824 .  furosemide (LASIX) injection 20 mg, 20 mg, Intravenous, Q12H, Tukov-Yual, Magdalene S, NP, 20 mg at 03/29/19 0752 .  ipratropium-albuterol (DUONEB) 0.5-2.5 (3) MG/3ML nebulizer solution 3 mL, 3 mL, Nebulization, Q6H, Coleton Woon, MD .  MEDLINE mouth rinse, 15 mL, Mouth Rinse, 10 times per day, AleOttie GlazierD, 15 mL at 03/29/19 0856 .  midazolam (VERSED) injection 1-2 mg, 1-2 mg, Intravenous, Q15 min PRN, Tukov-Yual, Magdalene S, NP, 2 mg at 03/28/19 2305 .  midazolam (VERSED) injection 1-2 mg, 1-2 mg, Intravenous, Q2H PRN, Tukov-Yual, Magdalene S, NP, 2 mg at 03/29/19 0459 .  mupirocin ointment (BACTROBAN) 2 %, , Nasal, BID, Tukov-Yual, Magdalene S, NP .  ondansetron (ZOFRAN) tablet 4 mg, 4 mg, Oral, Q6H PRN **OR** ondansetron (ZOFRAN) injection 4 mg, 4 mg, Intravenous, Q6H PRN, DiaHarrie ForemanD .  pantoprazole sodium (PROTONIX) 40 mg/20 mL oral suspension 40 mg, 40 mg, Per Tube, Daily, SimCharlett NosePH .  vancomycin (VANCOCIN) 1,500 mg in sodium chloride 0.9 % 500 mL IVPB, 1,500 mg, Intravenous, Q24H, PatDustin FlockD    ALLERGIES   Patient has no known allergies.    REVIEW OF SYSTEMS     Unable to obtain due to mild confusion post sedation.  PHYSICAL EXAMINATION   Vitals:   03/29/19 0900 03/29/19 0936  BP: 134/88   Pulse: (!) 58   Resp: 16   Temp:    SpO2: 98% 97%    GENERAL: No apparent distress HEAD: Normocephalic, atraumatic.  EYES: Pupils equal, round, reactive to light.  No scleral icterus.  MOUTH: Moist mucosal membrane. NECK: Supple. No thyromegaly. No nodules. No JVD.  PULMONARY: Decreased breath sounds bilaterally CARDIOVASCULAR: S1 and S2. Regular rate and rhythm. No murmurs, rubs, or gallops.  GASTROINTESTINAL: Soft, nontender, non-distended. No masses. Positive bowel sounds. No hepatosplenomegaly.  MUSCULOSKELETAL: No swelling, clubbing, or edema.  NEUROLOGIC: Mild distress due to acute illness SKIN:intact,warm,dry   LABS AND IMAGING       LAB RESULTS: Recent Labs  Lab 03/28/19 0219 03/29/19 0519  NA  143 146*  K 4.8 3.6  CL 101 105  CO2 31 31  BUN 34* 34*  CREATININE 1.14 1.14  GLUCOSE 188* 99   Recent Labs  Lab 03/28/19 0219 03/29/19 0519  HGB 10.9* 10.4*  HCT 36.9* 33.8*  WBC 10.9* 9.4  PLT 188 166     IMAGING RESULTS: No results found.    ASSESSMENT AND PLAN    -Multidisciplinary rounds held today  Acute Hypoxic Respiratory Failure -Due to decompensated heart failure with preserved ejection fraction as evidenced by severe hypoxemia with lower extremity edema, increased JVD, with elevated BNP. -Patient is status post diuresis and successful liberation from mechanical ventilation    COPD  -Continue COPD care path  -Incentive spirometer at bedside  -nightly BiPAP-patient uses trilogy device at home  ID  -Urinary tract infection due to E. Coli-continue Rocephin  -Methicillin-resistant staph  epidermidis-likely contaminant will stop vancomycin -continue IV abx as prescibed -follow up cultures   GI/Nutrition GI PROPHYLAXIS as indicated DIET-->TF's as tolerated Constipation protocol as indicated  ENDO - ICU hypoglycemic\Hyperglycemia protocol -check FSBS per protocol   ELECTROLYTES -follow labs as needed -replace as needed -pharmacy consultation   DVT/GI PRX ordered -SCDs  TRANSFUSIONS AS NEEDED MONITOR FSBS ASSESS the need for LABS as needed   Critical care provider statement:    Critical care time (minutes):  32   Critical care time was exclusive of:  Separately billable procedures and treating other patients   Critical care was necessary to treat or prevent imminent or life-threatening deterioration of the following conditions:   Acute hypoxemic respiratory failure, acute decompensated diastolic CHF, COPD, morbid obesity, multiple comorbid conditions.   Critical care was time spent personally by me on the following activities:  Development of treatment plan with patient or surrogate, discussions with consultants, evaluation of patient's response to treatment, examination of patient, obtaining history from patient or surrogate, ordering and performing treatments and interventions, ordering and review of laboratory studies and re-evaluation of patient's condition.  I assumed direction of critical care for this patient from another provider in my specialty: no    This document was prepared using Dragon voice recognition software and may include unintentional dictation errors.    Ottie Glazier, M.D.  Division of [place]

## 2019-03-29 NOTE — Progress Notes (Signed)
Dr. Lanney Gins notified of troponin of 434. No new orders received.

## 2019-03-29 NOTE — Progress Notes (Signed)
Pharmacy Electrolyte Monitoring Consult:  Pharmacy consulted to assist in monitoring and replacing electrolytes in this 83 y.o. male admitted on 03/28/2019.  Labs:  Sodium (mmol/L)  Date Value  03/29/2019 146 (H)  12/02/2012 142   Potassium (mmol/L)  Date Value  03/29/2019 3.6  12/02/2012 3.1 (L)   Magnesium (mg/dL)  Date Value  03/29/2019 2.5 (H)   Phosphorus (mg/dL)  Date Value  03/29/2019 3.1   Calcium (mg/dL)  Date Value  03/29/2019 8.4 (L)   Calcium, Total (mg/dL)  Date Value  12/02/2012 8.2 (L)   Albumin (g/dL)  Date Value  03/29/2019 2.4 (L)  12/02/2012 3.3 (L)    Assessment/Plan: Patient received total of 60mg  of IV Furosemide this am. Patient extubated this am and unable to take oral replacement.   Will order potassium 68mEq Q1hr x 3 doses.   Will replace for goal potassium ~ 4.   Electrolytes with am labs.   Pharmacy will continue to monitor and adjust per consult.   Simpson,Michael L 03/29/2019 4:47 PM

## 2019-03-29 NOTE — Progress Notes (Signed)
Hospice telephoned requesting update. Update given to  Horris Latino, Therapist, sports.

## 2019-03-29 NOTE — Progress Notes (Signed)
[employer] at [facility]                                                                                                                                                                                  Patient Demographics   Dylan [name], is a 83 y.o. male, DOB - 04-12-1936, IDP:[id number]  Admit date - 03/28/2019   Admitting Physician Harrie Foreman, MD  Outpatient Primary MD for the patient is Albina Billet, MD   LOS - 1  Subjective: Patient remains vent on the ventilator and sedated    Review of Systems:   CONSTITUTIONAL: Unable to provide due to sedation  Vitals:   Vitals:   03/29/19 1100 03/29/19 1200 03/29/19 1300 03/29/19 1400  BP: (!) 148/73 (!) 153/73 (!) 155/77 (!) 146/69  Pulse: 87 83 85 78  Resp: '16 14 17 16  '$ Temp:  98.7 F (37.1 C)    TempSrc:  Axillary    SpO2: 100% 100% 100% 100%  Weight:      Height:        Wt Readings from Last 3 Encounters:  03/29/19 128.8 kg  02/21/19 130 kg  02/03/19 120.7 kg     Intake/Output Summary (Last 24 hours) at 03/29/2019 1505 Last data filed at 03/29/2019 1208 Gross per 24 hour  Intake 925.06 ml  Output 1100 ml  Net -174.94 ml    Physical Exam:   GENERAL: Critically ill on the ventilator HEAD, EYES, EARS, NOSE AND THROAT: Atraumatic, normocephalic. Extraocular muscles are intact. Pupils equal and reactive to light. Sclerae anicteric. No conjunctival injection. No oro-pharyngeal erythema.  NECK: Supple. There is no jugular venous distention. No bruits, no lymphadenopathy, no thyromegaly.  HEART: Regular rate and rhythm,. No murmurs, no rubs, no clicks.  LUNGS: Clear to auscultation bilaterally. No rales or rhonchi. No wheezes.  ABDOMEN: Soft, flat, nontender, nondistended. Has good bowel sounds. No hepatosplenomegaly appreciated.  EXTREMITIES: No evidence of any cyanosis, clubbing, or peripheral edema.  +2 pedal and radial pulses bilaterally.  NEUROLOGIC: sedated SKIN: Moist and warm  with no rashes appreciated.  Psych: Sedated LN: No inguinal LN enlargement    Antibiotics   Anti-infectives (From admission, onward)   Start     Dose/Rate Route Frequency Ordered Stop   03/29/19 2200  vancomycin (VANCOCIN) 1,500 mg in sodium chloride 0.9 % 500 mL IVPB  Status:  Discontinued     1,500 mg 250 mL/hr over 120 Minutes Intravenous Every 24 hours 03/28/19 2225 03/29/19 1158   03/29/19 1000  cefTRIAXone (ROCEPHIN) 1 g in sodium chloride 0.9 % 100 mL IVPB     1 g 200 mL/hr over 30 Minutes Intravenous  Every 24 hours 03/29/19 0917     03/28/19 2200  vancomycin (VANCOCIN) 2,500 mg in sodium chloride 0.9 % 500 mL IVPB     2,500 mg 250 mL/hr over 120 Minutes Intravenous  Once 03/28/19 2138 03/29/19 0001   03/28/19 0200  cefTRIAXone (ROCEPHIN) 2 g in sodium chloride 0.9 % 100 mL IVPB  Status:  Discontinued     2 g 200 mL/hr over 30 Minutes Intravenous Every 24 hours 03/28/19 0159 03/28/19 2211   03/28/19 0200  azithromycin (ZITHROMAX) 500 mg in sodium chloride 0.9 % 250 mL IVPB  Status:  Discontinued     500 mg 250 mL/hr over 60 Minutes Intravenous Every 24 hours 03/28/19 0159 03/28/19 2211      Medications   Scheduled Meds: . chlorhexidine gluconate (MEDLINE KIT)  15 mL Mouth Rinse BID  . docusate  100 mg Per Tube BID  . enoxaparin (LOVENOX) injection  40 mg Subcutaneous Q12H  . furosemide  20 mg Intravenous Q12H  . ipratropium-albuterol  3 mL Nebulization Q6H  . mouth rinse  15 mL Mouth Rinse 10 times per day  . mupirocin ointment   Nasal BID  . pantoprazole sodium  40 mg Per Tube Daily   Continuous Infusions: . sodium chloride 5 mL/hr at 03/29/19 1208  . cefTRIAXone (ROCEPHIN)  IV Stopped (03/29/19 1025)  . fentaNYL infusion INTRAVENOUS Stopped (03/29/19 1007)   PRN Meds:.sodium chloride, acetaminophen **OR** acetaminophen, fentaNYL, midazolam, midazolam, ondansetron **OR** ondansetron (ZOFRAN) IV   Data Review:   Micro Results Recent Results (from the past 240  hour(s))  Blood Culture (routine x 2)     Status: Abnormal (Preliminary result)   Collection Time: 03/28/19  2:19 AM   Specimen: BLOOD  Result Value Ref Range Status   Specimen Description   Final    BLOOD RIGHT ANTECUBITAL Performed at [facility], [address], [place], [place] [address]    Special Requests   Final    BOTTLES DRAWN AEROBIC AND ANAEROBIC Blood Culture adequate volume Performed at [facility], [address] [place], [place] [address]    Culture  Setup Time   Final    IN BOTH AEROBIC AND ANAEROBIC BOTTLES GRAM POSITIVE COCCI CRITICAL RESULT CALLED TO, READ BACK BY AND VERIFIED WITH: ASAJAH DUNCAN ON 03/28/19 AT 2108 [facility]    Culture (A)  Final    STAPHYLOCOCCUS SPECIES (COAGULASE NEGATIVE) THE SIGNIFICANCE OF ISOLATING THIS ORGANISM FROM A SINGLE SET OF BLOOD CULTURES WHEN MULTIPLE SETS ARE DRAWN IS UNCERTAIN. PLEASE NOTIFY THE MICROBIOLOGY DEPARTMENT WITHIN ONE WEEK IF SPECIATION AND SENSITIVITIES ARE REQUIRED. Performed at [facility], [place] [address], [place], [place] [address]    Report Status PENDING  Incomplete  Blood Culture (routine x 2)     Status: None (Preliminary result)   Collection Time: 03/28/19  2:19 AM   Specimen: BLOOD  Result Value Ref Range Status   Specimen Description BLOOD RIGHT ARM  Final   Special Requests   Final    BOTTLES DRAWN AEROBIC AND ANAEROBIC Blood Culture results may not be optimal due to an excessive volume of blood received in culture bottles   Culture   Final    NO GROWTH 1 DAY Performed at [facility], [address], [place], [place] [address]    Report Status PENDING  Incomplete  Urine culture     Status: Abnormal (Preliminary result)   Collection Time: 03/28/19  2:19 AM   Specimen: Urine, Random  Result  Value Ref Range Status   Specimen Description   Final    URINE, RANDOM Performed at [facility], [address] [place], [place] [address]    Special Requests    Final    NONE Performed at [facility], [address] [place], [place] [address]    Culture >=100,000 COLONIES/mL ESCHERICHIA COLI (A)  Final   Report Status PENDING  Incomplete  SARS Coronavirus 2 (CEPHEID- Performed in [place] hospital lab), Hosp Order     Status: None   Collection Time: 03/28/19  2:19 AM   Specimen: Urine, Unspecified Source; Nasopharyngeal  Result Value Ref Range Status   SARS Coronavirus 2 NEGATIVE NEGATIVE Final    Comment: (NOTE) If result is NEGATIVE SARS-CoV-2 target nucleic acids are NOT DETECTED. The SARS-CoV-2 RNA is generally detectable in upper and lower  respiratory specimens during the acute phase of infection. The lowest  concentration of SARS-CoV-2 viral copies this assay can detect is 250  copies / mL. A negative result does not preclude SARS-CoV-2 infection  and should not be used as the sole basis for treatment or other  patient management decisions.  A negative result may occur with  improper specimen collection / handling, submission of specimen other  than nasopharyngeal swab, presence of viral mutation(s) within the  areas targeted by this assay, and inadequate number of viral copies  (<250 copies / mL). A negative result must be combined with clinical  observations, patient history, and epidemiological information. If result is POSITIVE SARS-CoV-2 target nucleic acids are DETECTED. The SARS-CoV-2 RNA is generally detectable in upper and lower  respiratory specimens dur ing the acute phase of infection.  Positive  results are indicative of active infection with SARS-CoV-2.  Clinical  correlation with patient history and other diagnostic information is  necessary to determine patient infection status.  Positive results do  not rule out bacterial infection or co-infection with other viruses. If result is PRESUMPTIVE POSTIVE SARS-CoV-2 nucleic acids MAY BE PRESENT.   A presumptive positive result was obtained on the submitted  specimen  and confirmed on repeat testing.  While 2019 novel coronavirus  (SARS-CoV-2) nucleic acids may be present in the submitted sample  additional confirmatory testing may be necessary for epidemiological  and / or clinical management purposes  to differentiate between  SARS-CoV-2 and other Sarbecovirus currently known to infect humans.  If clinically indicated additional testing with an alternate test  methodology [phone]) is advised. The SARS-CoV-2 RNA is generally  detectable in upper and lower respiratory sp ecimens during the acute  phase of infection. The expected result is Negative. Fact Sheet for Patients:  StrictlyIdeas.no Fact Sheet for Healthcare Providers: BankingDealers.co.za This test is not yet approved or cleared by the [place] FDA and has been authorized for detection and/or diagnosis of SARS-CoV-2 by FDA under an Emergency Use Authorization (EUA).  This EUA will remain in effect (meaning this test can be used) for the duration of the COVID-19 declaration under Section 564(b)(1) of the Act, 21 U.S.C. section 360bbb-3(b)(1), unless the authorization is terminated or revoked sooner. Performed at [facility], [address] [place], [place] [address]   Blood Culture ID Panel (Reflexed)     Status: Abnormal   Collection Time: 03/28/19  2:19 AM  Result Value Ref Range Status   Enterococcus species NOT DETECTED NOT DETECTED Final   Listeria monocytogenes NOT DETECTED NOT DETECTED Final   Staphylococcus species DETECTED (A) NOT DETECTED Final    Comment:  Methicillin (oxacillin) resistant coagulase negative staphylococcus. Possible blood culture contaminant (unless isolated from more than one blood culture draw or clinical case suggests pathogenicity). No antibiotic treatment is indicated for blood  culture contaminants. CRITICAL RESULT CALLED TO, READ BACK BY AND VERIFIED WITH: ASAJAH DUNCAN ON 03/28/19  AT 2[address] [place]    Staphylococcus aureus (BCID) NOT DETECTED NOT DETECTED Final   Methicillin resistance DETECTED (A) NOT DETECTED Final    Comment: CRITICAL RESULT CALLED TO, READ BACK BY AND VERIFIED WITH: ASAJAH DUNCAN ON 03/28/19 AT 2108 [place]    Streptococcus species NOT DETECTED NOT DETECTED Final   Streptococcus agalactiae NOT DETECTED NOT DETECTED Final   Streptococcus pneumoniae NOT DETECTED NOT DETECTED Final   Streptococcus pyogenes NOT DETECTED NOT DETECTED Final   Acinetobacter baumannii NOT DETECTED NOT DETECTED Final   Enterobacteriaceae species NOT DETECTED NOT DETECTED Final   Enterobacter cloacae complex NOT DETECTED NOT DETECTED Final   Escherichia coli NOT DETECTED NOT DETECTED Final   Klebsiella oxytoca NOT DETECTED NOT DETECTED Final   Klebsiella pneumoniae NOT DETECTED NOT DETECTED Final   Proteus species NOT DETECTED NOT DETECTED Final   Serratia marcescens NOT DETECTED NOT DETECTED Final   Haemophilus influenzae NOT DETECTED NOT DETECTED Final   Neisseria meningitidis NOT DETECTED NOT DETECTED Final   Pseudomonas aeruginosa NOT DETECTED NOT DETECTED Final   Candida albicans NOT DETECTED NOT DETECTED Final   Candida glabrata NOT DETECTED NOT DETECTED Final   Candida krusei NOT DETECTED NOT DETECTED Final   Candida parapsilosis NOT DETECTED NOT DETECTED Final   Candida tropicalis NOT DETECTED NOT DETECTED Final    Comment: Performed at [facility], [address] [place], [place] [address]  MRSA PCR Screening     Status: Abnormal   Collection Time: 03/28/19  5:47 AM   Specimen: Nasal Mucosa; Nasopharyngeal  Result Value Ref Range Status   MRSA by PCR POSITIVE (A) NEGATIVE Final    Comment:        The GeneXpert MRSA Assay (FDA approved for NASAL specimens only), is one component of a comprehensive MRSA colonization surveillance program. It is not intended to diagnose MRSA infection nor to guide or monitor treatment for MRSA infections. RESULT  CALLED TO, READ BACK BY AND VERIFIED WITH: KAYLA FOSTER '@[id number]'$  ON 03/28/2019 BY FMW Performed at [facility], [address] [place], [place] [address]     Radiology Reports Dg Chest [place] 1 View  Result Date: 03/28/2019 CLINICAL DATA:  Intubation EXAM: PORTABLE CHEST 1 VIEW COMPARISON:  02/23/2019 FINDINGS: The endotracheal tube tip just below the level of clavicular heads. Orogastric tube side port projects over the gastric body. Patient is markedly rotated to the left, obscuring most of the left hemithorax. Visualized right lung is clear. IMPRESSION: Radiographically appropriate position of endotracheal 2. Electronically Signed   By: Ulyses Jarred M.D.   On: 03/28/2019 02:34     CBC Recent Labs  Lab 03/28/19 0219 03/29/19 0519  WBC 10.9* 9.4  HGB 10.9* 10.4*  HCT 36.9* 33.8*  PLT 188 166  MCV 103.7* 99.7  MCH 30.6 30.7  MCHC 29.5* 30.8  RDW 17.2* 17.6*  LYMPHSABS 0.6* 1.6  MONOABS 0.5 0.6  EOSABS 0.2 0.1  BASOSABS 0.1 0.0    Chemistries  Recent Labs  Lab 03/28/19 0219 03/29/19 0519  NA 143 146*  K 4.8 3.6  CL 101 105  CO2 31 31  GLUCOSE 188* 99  BUN 34* 34*  CREATININE 1.14 1.14  CALCIUM 8.4* 8.4*  MG  --  2.5*  AST 49* 37  ALT 28 23  ALKPHOS 148* 107  BILITOT 0.8 0.9   ------------------------------------------------------------------------------------------------------------------ estimated creatinine clearance is 69.5 mL/min (by C-G formula based on SCr of 1.14 mg/dL). ------------------------------------------------------------------------------------------------------------------ No results for input(s): HGBA1C in the last 72 hours. ------------------------------------------------------------------------------------------------------------------ No results for input(s): CHOL, HDL, LDLCALC, TRIG, CHOLHDL, LDLDIRECT in the last 72  hours. ------------------------------------------------------------------------------------------------------------------ Recent Labs    03/28/19 0219  TSH 49.284*   ------------------------------------------------------------------------------------------------------------------ No results for input(s): VITAMINB12, FOLATE, FERRITIN, TIBC, IRON, RETICCTPCT in the last 72 hours.  Coagulation profile Recent Labs  Lab 03/28/19 0219  INR 1.1    No results for input(s): DDIMER in the last 72 hours.  Cardiac Enzymes No results for input(s): CKMB, TROPONINI, MYOGLOBIN in the last 168 hours.  Invalid input(s): CK ------------------------------------------------------------------------------------------------------------------ Invalid input(s): [place]   This is an 83 year old male admitted for respiratory failure. 1.  Respiratory failure: Acute on chronic; with hypoxia.  Continue mechanical ventilation.  COVID-19 negative.  Continue antibiotics.      Continue mechanical ventilation plan for extubation for tomorrow 2.  COPD: End-stage; schedule albuterol treatments.  3.  CAD: Troponin is elevated likely due to demand ischemia  4.  Hypertension: Hold antihypertensive agents at this time. Patient is currently hypotensive.   5.  DVT prophylaxis: Lovenox 6.  GI prophylaxis: PPI after 24 hours intubation      Code Status Orders  (From admission, onward)         Start     Ordered   03/28/19 0517  Full code  Continuous     03/28/19 0516        Code Status History    Date Active Date Inactive Code Status Order ID Comments User Context   02/21/2019 1125 02/24/2019 2310 Full Code [id number]  Loletha Grayer, MD ED   01/30/2019 1805 02/03/2019 1754 Full Code [id number]  Otila Back, MD ED   11/23/2018 0619 12/01/2018 1541 Full Code [id number]  Harrie Foreman, MD Inpatient   11/14/2018 0314 11/19/2018 1822 Full Code [id number]  Mansy, Arvella Merles, MD ED   10/27/2018 2101  10/31/2018 2110 Partial Code [id number]  Vaughan Basta, MD Inpatient   08/26/2018 1137 09/04/2018 1912 Full Code [id number]  Hessie Knows, MD ED   06/21/2018 1321 06/24/2018 2008 Partial Code [id number]  Hillary Bow, MD Inpatient   06/21/2018 0803 06/21/2018 1321 Full Code [id number]  Wellington [place], MD Inpatient   06/01/2018 2009 06/04/2018 1718 Full Code [id number]  Dereck Leep, MD Inpatient   01/11/2018 0012 01/14/2018 1908 Full Code [id number]  Amelia Jo, MD Inpatient   12/01/2017 1924 12/05/2017 1948 Full Code [id number]  Vaughan Basta, MD Inpatient   08/31/2017 0557 09/05/2017 1959 Full Code [id number]  Harrie Foreman, MD Inpatient   06/10/2016 1934 06/13/2016 1816 Full Code [id number]  Demetrios Loll, MD Inpatient   12/15/2015 0134 12/16/2015 1507 Full Code [id number]  Saundra Shelling, MD ED   Advance Care Planning Activity           Consults pulmonary critical care  DVT Prophylaxis Lovenox Lab Results  Component Value Date   PLT 166 03/29/2019     Time Spent in minutes  51mn Greater than 50% of time spent in care coordination and counseling patient regarding the condition and plan of care.   SDustin FlockM.D on 03/29/2019 at 3:05 PM  Between 7am to 6pm - Pager - [phone]  After 6pm go to [email or web address] - password  [employer]  [phone]

## 2019-03-29 NOTE — Progress Notes (Signed)
Visit made. Patient is currently followed by [facility] at home with a hospice diagnosis of COPD. He is a Full code. Patient was sent to the [facility] ED via EMS for evaluation of unresponsiveness and decreased oxygen saturations. Hospice RN was notified and aware of transport. On arrival to the ED patient required intubation and was admittedto the ICU.  Patient seen lying in bed, daughter in law [place] at bedside. Patient extubated prior to writers visit. Patient attempting to talk, but very difficult to understand speech. Bernice relayed the events of yesterday and evening. Patient went to be with his BIPAP, but at sometime during the night family thinks he moved it off. He is currently on heated hiflo with saturations during the visit at 100%. Oris Drone reports that patient does not walk, but a lift is used to get him up daily to a lift chair or into a wheel chair. She stated that patient has been alert and oriented up til this point and made his own decisions regarding his code status. Family is hopeful for improvement with current interventions and Full code status continuing. He is currently receiving IV antibiotics, IV lasix and scheduled nebulizer treatments. Emotional support given to [facility] Will continue to follow and update hospice team. Flo Shanks BSN, RN, [place] [phone]

## 2019-03-30 DIAGNOSIS — J9621 Acute and chronic respiratory failure with hypoxia: Principal | ICD-10-CM

## 2019-03-30 DIAGNOSIS — J449 Chronic obstructive pulmonary disease, unspecified: Secondary | ICD-10-CM

## 2019-03-30 DIAGNOSIS — J440 Chronic obstructive pulmonary disease with acute lower respiratory infection: Secondary | ICD-10-CM

## 2019-03-30 DIAGNOSIS — Z515 Encounter for palliative care: Secondary | ICD-10-CM

## 2019-03-30 LAB — CBC WITH DIFFERENTIAL/PLATELET
Abs Immature Granulocytes: 0.09 10*3/uL — ABNORMAL HIGH (ref 0.00–0.07)
Basophils Absolute: 0 10*3/uL (ref 0.0–0.1)
Basophils Relative: 0 %
Eosinophils Absolute: 0.2 10*3/uL (ref 0.0–0.5)
Eosinophils Relative: 2 %
HCT: 36 % — ABNORMAL LOW (ref 39.0–52.0)
Hemoglobin: 11.2 g/dL — ABNORMAL LOW (ref 13.0–17.0)
Immature Granulocytes: 1 %
Lymphocytes Relative: 13 %
Lymphs Abs: 1.4 10*3/uL (ref 0.7–4.0)
MCH: 30.9 pg (ref 26.0–34.0)
MCHC: 31.1 g/dL (ref 30.0–36.0)
MCV: 99.4 fL (ref 80.0–100.0)
Monocytes Absolute: 0.8 10*3/uL (ref 0.1–1.0)
Monocytes Relative: 7 %
Neutro Abs: 8.6 10*3/uL — ABNORMAL HIGH (ref 1.7–7.7)
Neutrophils Relative %: 77 %
Platelets: 173 10*3/uL (ref 150–400)
RBC: 3.62 MIL/uL — ABNORMAL LOW (ref 4.22–5.81)
RDW: 17.7 % — ABNORMAL HIGH (ref 11.5–15.5)
WBC: 11.2 10*3/uL — ABNORMAL HIGH (ref 4.0–10.5)
nRBC: 0 % (ref 0.0–0.2)

## 2019-03-30 LAB — BASIC METABOLIC PANEL
Anion gap: 14 (ref 5–15)
BUN: 32 mg/dL — ABNORMAL HIGH (ref 8–23)
CO2: 29 mmol/L (ref 22–32)
Calcium: 8.7 mg/dL — ABNORMAL LOW (ref 8.9–10.3)
Chloride: 103 mmol/L (ref 98–111)
Creatinine, Ser: 1.13 mg/dL (ref 0.61–1.24)
GFR calc Af Amer: >60 mL/min (ref >60)
GFR calc non Af Amer: >60 mL/min (ref >60)
Glucose, Bld: 88 mg/dL (ref 70–99)
Potassium: 3.7 mmol/L (ref 3.5–5.1)
Sodium: 146 mmol/L — ABNORMAL HIGH (ref 135–145)

## 2019-03-30 LAB — MAGNESIUM: Magnesium: 2.4 mg/dL (ref 1.7–2.4)

## 2019-03-30 LAB — CULTURE, BLOOD (ROUTINE X 2): Special Requests: ADEQUATE

## 2019-03-30 LAB — GLUCOSE, CAPILLARY
Glucose-Capillary: 121 mg/dL — ABNORMAL HIGH (ref 70–99)
Glucose-Capillary: 159 mg/dL — ABNORMAL HIGH (ref 70–99)
Glucose-Capillary: 164 mg/dL — ABNORMAL HIGH (ref 70–99)
Glucose-Capillary: 82 mg/dL (ref 70–99)
Glucose-Capillary: 89 mg/dL (ref 70–99)

## 2019-03-30 LAB — PHOSPHORUS: Phosphorus: 4 mg/dL (ref 2.5–4.6)

## 2019-03-30 LAB — PROCALCITONIN: Procalcitonin: 0.18 ng/mL

## 2019-03-30 MED ORDER — ENSURE ENLIVE PO LIQD
237.0000 mL | Freq: Three times a day (TID) | ORAL | Status: DC
  Administered 2019-03-30 – 2019-04-02 (×6): 237 mL via ORAL

## 2019-03-30 MED ORDER — CHLORHEXIDINE GLUCONATE CLOTH 2 % EX PADS
6.0000 | MEDICATED_PAD | Freq: Every day | CUTANEOUS | Status: DC
  Administered 2019-03-30 – 2019-04-02 (×4): 6 via TOPICAL

## 2019-03-30 MED ORDER — OCUVITE-LUTEIN PO CAPS
1.0000 | ORAL_CAPSULE | Freq: Every day | ORAL | Status: DC
  Administered 2019-03-31 – 2019-04-02 (×3): 1 via ORAL
  Filled 2019-03-30 (×3): qty 1

## 2019-03-30 MED ORDER — HYDRALAZINE HCL 20 MG/ML IJ SOLN
10.0000 mg | INTRAMUSCULAR | Status: DC | PRN
  Administered 2019-03-30 (×3): 20 mg via INTRAVENOUS
  Filled 2019-03-30 (×3): qty 1

## 2019-03-30 MED ORDER — TRAZODONE HCL 50 MG PO TABS
50.0000 mg | ORAL_TABLET | Freq: Every evening | ORAL | Status: DC | PRN
  Administered 2019-03-30 – 2019-03-31 (×2): 50 mg via ORAL
  Filled 2019-03-30 (×2): qty 1

## 2019-03-30 MED ORDER — ENOXAPARIN SODIUM 40 MG/0.4ML ~~LOC~~ SOLN
40.0000 mg | Freq: Every day | SUBCUTANEOUS | Status: DC
  Administered 2019-03-31 – 2019-04-02 (×3): 40 mg via SUBCUTANEOUS
  Filled 2019-03-30 (×3): qty 0.4

## 2019-03-30 MED ORDER — TRAMADOL HCL 50 MG PO TABS
50.0000 mg | ORAL_TABLET | Freq: Three times a day (TID) | ORAL | Status: DC | PRN
  Administered 2019-03-30 – 2019-04-01 (×3): 50 mg via ORAL
  Filled 2019-03-30 (×3): qty 1

## 2019-03-30 MED ORDER — POTASSIUM CHLORIDE CRYS ER 20 MEQ PO TBCR
20.0000 meq | EXTENDED_RELEASE_TABLET | Freq: Once | ORAL | Status: AC
  Administered 2019-03-30: 11:00:00 20 meq via ORAL
  Filled 2019-03-30: qty 1

## 2019-03-30 MED ORDER — QUETIAPINE FUMARATE 25 MG PO TABS
50.0000 mg | ORAL_TABLET | Freq: Every evening | ORAL | Status: DC | PRN
  Administered 2019-03-30 – 2019-04-01 (×2): 50 mg via ORAL
  Filled 2019-03-30 (×2): qty 2

## 2019-03-30 NOTE — Progress Notes (Signed)
Visit made. Patient seen sitting up in bed, daughter in law [place] at bedside. Patient found more alert than yesterday, able to answer questions, but slow to respond. Visit made with palliative NP Ihor Dow. Patient saying "oh oh", reports pain "all over" when question. Home medications reviewed and tramadol 50 mg to be restarted, order placed by NP Jinny Blossom, patient and DIL aware, staff RN Megan aware. Patient continues on IV antibiotics for treatment of UTI and IV lasix. Appetite poor, eating ice chips during visit. Oris Drone reports he ate a few bites of sherbet earlier. RT Bambi in during visit, patient changed from HIflo to 6 liters via nasal cannula, baseline at home is 2 liters. Plan is for BIPAP at night. Patient dozing at end of visit. PRN Tramadol dose to be given. Will continue to follow and update hopsice team. Flo Shanks BSN, RN, [facility] [phone]

## 2019-03-30 NOTE — Progress Notes (Signed)
Pharmacy Electrolyte Monitoring Consult:  Pharmacy consulted to assist in monitoring and replacing electrolytes in this 83 y.o. male admitted on 03/28/2019.  Labs:  Sodium (mmol/L)  Date Value  03/30/2019 146 (H)  12/02/2012 142   Potassium (mmol/L)  Date Value  03/30/2019 3.7  12/02/2012 3.1 (L)   Magnesium (mg/dL)  Date Value  03/30/2019 2.4   Phosphorus (mg/dL)  Date Value  03/30/2019 4.0   Calcium (mg/dL)  Date Value  03/30/2019 8.7 (L)   Calcium, Total (mg/dL)  Date Value  12/02/2012 8.2 (L)   Albumin (g/dL)  Date Value  03/29/2019 2.4 (L)  12/02/2012 3.3 (L)    Assessment/Plan: Patient ordered furosemide 20mg  IV Q12hr.   Will order potassium 3mEq PO x 1.   Will replace for goal potassium ~ 4, magnesium ~ 2.   Electrolytes with am labs.   Pharmacy will continue to monitor and adjust per consult.   Nikolaus Pienta L 03/30/2019 12:00 PM

## 2019-03-30 NOTE — Consult Note (Signed)
Consultation Note Date: 03/30/2019   Patient Name: Dylan [name]  DOB: Sep 30, 1935  MRN: [id number]  Age / Sex: 83 y.o., male  PCP: Jaclyn Shaggyate, [name], MD Referring Physician: Auburn BilberryPatel, Shreyang, MD  Reason for Consultation: Establishing goals of care  HPI/Patient Profile: 83 y.o. male  with past medical history of chronic respiratory failure, COPD on home oxygen and trilogy, CHF EF 35-40%, CAD, MI, HTN, HLD, OSA, hip fracture December 2019, current hospice patient admitted on 03/28/2019 with unresponsiveness and hypoxia. Patient required intubation and ICU admission for acute hypoxic respiratory failure with underlying COPD and CHF. Extubated to HFNC 7/27. Urine culture positive for E. Coli receiving IV antibiotics. Followed by [facility]. Palliative medicine for goals of care.  Clinical Assessment and Goals of Care:  I have reviewed medical records, discussed with care team, and assessed the patient at bedside.  [name] is awake, alert, oriented to person, place, and situation. He appropriately answers many questions that are asked by PMT provider but will continuously call out for his wife, Dylan JonesCarolyn. Reassured him that his DIL, [name] plans to visit hospital soon. He denies pain or shortness of breath.  Explained to patient events leading up to admission and course of hospitalization including his requirement for life support machine. [name] is glad he was able to be extubated quickly.   Spent time discussing the chronic, irreversible nature of his heart and lung conditions. Explained medical management of these conditions and high risk for recurrent hospitalization due to exacerbation. Explained that he is also being treated for UTI.   [name] confirms that hospice services was recently initiated in his home. He is bed/wheelchair bound but his family uses a lift to put him in a chair daily. He requires  2L home oxygen and BiPAP at night.    [name] speaks of hoping he can return home following hospitalization.   Attempted to explore [name] thoughts regarding code status and re-intubation if necessary. He does not answer questions and continues to call for [facility]. Discussed with RN who reports DIL should arrive soon to sit with him and that family reports he does not like being alone.   **This afternoon, follow-up at bedside with hospice liaison and DIL, Bernice at bedside. Patient is eating ice chips. He was started on a soft diet. RT at bedside with plans to attempt HFNC wean (currently 100% on HFNC). He c/o of generalized discomfort and moans when legs are touched. Continuously states "oh, oh." [name] reports that he often does this at home and has chronic left leg pain from knee/hip surgical repair. Tramadol 50mg  PO q8h prn ordered. Patient takes Tramadol at home. Instructed RN to give prn Tramadol.  [name] is readily available to sit with [name] daily at the hospital since allowed one visitor with covid restrictions. Other family members work.   Prior to admission, [name] shares that [name] was making all medical decisions for himself including FULL code. He signed his hospice paperwork. [name] shares his decision for FULL code and short-term intubation if medical  optimization could be achieved. She shares that he would not wish for prolonged ventilation. Discussed plan of care.   Reassured of ongoing support from palliative and hospice liaison while [name] is hospitalized.    SUMMARY OF RECOMMENDATIONS    Although patient is followed by home hospice services, he has previously spoken of wish for FULL code. DIL confirms his desire for short-term intubation with hopes of medical optimization. He would not wish for prolonged ventilation.   Continue current plan of care and medical management. Successfully extubated. Attempt wean from HFNC this afternoon.   [facility] hospice  following inpt.   PMT will continue to follow and support pt/family. May benefit from MOST form.   Code Status/Advance Care Planning:  Full code  Symptom Management:   Per attending  Palliative Prophylaxis:   Aspiration, Delirium Protocol, Oral Care and Turn Reposition  Additional Recommendations (Limitations, Scope, Preferences):  Full Scope Treatment  Psycho-social/Spiritual:   Desire for further Chaplaincy support:yes  Additional Recommendations: Caregiving  Support/Resources, Compassionate Wean Education and Education on Hospice  Prognosis:   Unable to determine  Discharge Planning: Home with Hospice      Primary Diagnoses: Present on Admission:  Acute on chronic respiratory failure with hypoxemia (HCC)   I have reviewed the medical record, interviewed the patient and family, and examined the patient. The following aspects are pertinent.  Past Medical History:  Diagnosis Date   CAD (coronary artery disease)    Cervicalgia    CHF (congestive heart failure) (HCC)    COPD (chronic obstructive pulmonary disease) (HCC)    Diastolic heart failure (HCC)    Foot drop, right    History of kidney stones    Hyperlipidemia    unspecified   Hypertension    Myocardial infarction (HCC)    Osteoarthritis    Shoulder pain, left    Sleep apnea    Tremor, essential    Social History   Socioeconomic History   Marital status: Married    Spouse name: Dylan [name]   Number of children: 2   Years of education: 11   Highest education level: 11th grade  Occupational History   Occupation: retired  Ecologist strain: Not on file   Food insecurity    Worry: Not on file    Inability: Not on Occupational hygienist needs    Medical: Not on file    Non-medical: Not on file  Tobacco Use   Smoking status: Former Smoker    Packs/day: 0.25    Years: 58.00    Pack years: 14.50    Types: Cigarettes   Smokeless tobacco: Former  Neurosurgeon   Tobacco comment: quit the begginning of septembet 2019  Substance and Sexual Activity   Alcohol use: No    Alcohol/week: 0.0 standard drinks   Drug use: No   Sexual activity: Not on file  Lifestyle   Physical activity    Days per week: Not on file    Minutes per session: Not on file   Stress: Not on file  Relationships   Social connections    Talks on phone: Not on file    Gets together: Not on file    Attends religious service: Not on file    Active member of club or organization: Not on file    Attends meetings of clubs or organizations: Not on file    Relationship status: Not on file  Other Topics Concern   Not on file  Social History  Narrative   Full Code with HCPOA and living will   Married with 2 children   Former smoker   Alcohol - none   Smokeless tobacco - none   Family History  Problem Relation Age of Onset   Diabetes Son    Cancer Mother    Colon cancer Mother    Lung cancer Mother    Tremor Father    Heart disease Father    Tremor Brother    Bladder Cancer Brother    Tremor Sister    Scheduled Meds:  chlorhexidine  15 mL Mouth Rinse BID   Chlorhexidine Gluconate Cloth  6 each Topical Q0600   enoxaparin (LOVENOX) injection  40 mg Subcutaneous Q12H   furosemide  20 mg Intravenous BID   ipratropium-albuterol  3 mL Nebulization Q6H   mouth rinse  15 mL Mouth Rinse q12n4p   mupirocin ointment   Nasal BID   pantoprazole  40 mg Oral Daily   Continuous Infusions:  sodium chloride 0 mL/hr at 03/29/19 1707   cefTRIAXone (ROCEPHIN)  IV 1 g (03/30/19 0942)   PRN Meds:.sodium chloride, acetaminophen **OR** acetaminophen, hydrALAZINE, ondansetron **OR** ondansetron (ZOFRAN) IV, traZODone Medications Prior to Admission:  Prior to Admission medications   Medication Sig Start Date End Date Taking? Authorizing Provider  acetaminophen (TYLENOL) 500 MG tablet Take 1,000 mg by mouth 2 (two) times daily as needed for moderate pain.    Yes [provider]  alum & mag hydroxide-simeth (MAALOX PLUS) 400-400-40 MG/5ML suspension Take 30 mLs by mouth every 4 (four) hours as needed for indigestion. 09/04/18  Yes [provider]  Ascorbic Acid (VITAMIN C PO) Take 500 mg by mouth daily.    Yes [provider]  aspirin EC 81 MG tablet Take 81 mg by mouth daily.   Yes [provider]  atorvastatin (LIPITOR) 80 MG tablet Take 80 mg by mouth daily.  09/04/18  Yes [provider]  carvedilol (COREG) 6.25 MG tablet Take 6.25 mg by mouth 2 (two) times daily with a meal. 09/07/18  Yes [provider]  Cholecalciferol (VITAMIN D-1000 MAX ST) 25 MCG (1000 UT) tablet Take 1 tablet (1,000 Units total) by mouth daily. 12/24/18  Yes Clarisa KindredHackney, Tina A, FNP  folic acid (V-R FOLIC ACID) 400 MCG tablet Take 1 tablet (400 mcg total) by mouth daily. 12/24/18  Yes Clarisa KindredHackney, Tina A, FNP  furosemide (LASIX) 40 MG tablet Take 40 mg by mouth 2 (two) times daily.   Yes [provider]  gabapentin (NEURONTIN) 100 MG capsule Take 100 mg by mouth at bedtime as needed (pain).   Yes [provider]  Glycerin, Laxative, (FLEET LIQUID GLYCERIN SUPP RE) Place 1 suppository rectally every 8 (eight) hours as needed (constipation).   Yes [provider]  ipratropium-albuterol (DUONEB) 0.5-2.5 (3) MG/3ML SOLN Take 3 mLs by nebulization every 6 (six) hours as needed (shortness of breath).    Yes [provider]  isosorbide mononitrate (IMDUR) 30 MG 24 hr tablet Take 30 mg by mouth daily.   Yes [provider]  ketotifen (ZADITOR) 0.025 % ophthalmic solution Place 1 drop into both eyes as needed. Patient taking differently: Place 1 drop into both eyes as needed (allergy symptoms).  12/24/18  Yes Hackney, Inetta Fermoina A, FNP  loratadine (CLARITIN) 10 MG tablet Take 1 tablet (10 mg total) by mouth as needed for allergies. 12/24/18  Yes Hackney, Inetta Fermoina A, FNP  magnesium hydroxide (MILK OF MAGNESIA) 400  MG/5ML suspension Take 30 mLs  by mouth daily as needed for mild constipation.    Yes [provider]  Multiple Vitamins-Minerals (CENTROVITE) TABS Take 1 tablet by mouth daily.   Yes [provider]  nitroGLYCERIN (NITROSTAT) 0.3 MG SL tablet Place 0.3 mg under the tongue every 5 (five) minutes as needed for chest pain.   Yes [provider]  pantoprazole (PROTONIX) 40 MG tablet Take 1 tablet (40 mg total) by mouth daily. 12/01/18  Yes [name], Avel Peace, MD  polyethylene glycol (MIRALAX / GLYCOLAX) packet Take 17 g by mouth 2 (two) times daily as needed. Hold for loose stools. 09/29/18  Yes [provider]  potassium chloride SA (K-DUR) 20 MEQ tablet Take 1 tablet (20 mEq total) by mouth daily. 12/24/18  Yes Hackney, Tina A, FNP  primidone (MYSOLINE) 50 MG tablet Take 150 mg by mouth 2 (two) times daily.    Yes [provider]  QUEtiapine (SEROQUEL) 50 MG tablet Take 1 tablet (50 mg total) by mouth at bedtime as needed. 12/24/18  Yes Hackney, Tina A, FNP  sacubitril-valsartan (ENTRESTO) 24-26 MG Take 1 tablet by mouth 2 (two) times daily.   Yes [provider]  sennosides-docusate sodium (SENOKOT-S) 8.6-50 MG tablet Take 2 tablets by mouth daily as needed for constipation. 12/24/18  Yes Hackney, [name] A, FNP  spironolactone (ALDACTONE) 25 MG tablet Take 12.5 mg by mouth daily.  09/04/18  Yes [provider]  tamsulosin (FLOMAX) 0.4 MG CAPS capsule 0.4 mg daily.   Yes [provider]  traMADol (ULTRAM) 50 MG tablet Take 50 mg by mouth every 6 (six) hours as needed for moderate pain.   Yes [provider]  traZODone (DESYREL) 50 MG tablet Take 50 mg by mouth at bedtime. 01/07/19 01/07/20 Yes [provider]  umeclidinium-vilanterol (ANORO ELLIPTA) 62.5-25 MCG/INH AEPB Inhale 1 puff into the lungs as needed. Patient taking differently: Inhale 1 puff into the lungs as needed (respiratory symptoms).  12/24/18  Yes Darylene Price A,  FNP  gabapentin (NEURONTIN) 300 MG capsule Take 1 capsule (300 mg total) by mouth at bedtime. Patient not taking: Reported on 03/29/2019 12/24/18   Alisa Graff, FNP   No Known Allergies Review of Systems  Unable to perform ROS: Mental status change   Physical Exam Vitals signs and nursing note reviewed.  Constitutional:      General: He is awake.     Appearance: He is ill-appearing.  HENT:     Head: Normocephalic and atraumatic.  Cardiovascular:     Rate and Rhythm: Normal rate.  Pulmonary:     Effort: No tachypnea, accessory muscle usage or respiratory distress.     Comments: HFNC Skin:    General: Skin is warm and dry.     Coloration: Skin is pale.  Neurological:     Mental Status: He is alert.     Comments: Oriented to person, place, situation    Vital Signs: BP (!) 160/101    Pulse 88    Temp 98.6 F (37 C) (Oral)    Resp (!) 28    Ht 5\' 11"  (1.803 m)    Wt 128.8 kg    SpO2 100%    BMI 39.60 kg/m  Pain Scale: CPOT      SpO2: SpO2: 100 % O2 Device:SpO2: 100 % O2 Flow Rate: .O2 Flow Rate (L/min): 30 L/min  IO: Intake/output summary:   Intake/Output Summary (Last 24 hours) at 03/30/2019 1017 Last data filed at 03/30/2019 0800 Gross per 24 hour  Intake 406.39 ml  Output 1075 ml  Net -668.61 ml    LBM: Last BM Date: (PTA) Baseline Weight: Weight: 136.1 kg Most recent weight: Weight: 128.8 kg     Palliative Assessment/Data: PPS 30%     Time In/Out: 0940-1020, [id number] Time Total: 55 Greater than 50%  of this time was spent counseling and coordinating care related to the above assessment and plan.  Signed by:  Vennie HomansMegan Samreet Edenfield, FNP-C Palliative Medicine Team  Phone: [phone] Fax: [phone]   Please contact Palliative Medicine Team phone at [phone] for questions and concerns.  For individual provider: See Loretha StaplerAmion

## 2019-03-30 NOTE — Progress Notes (Signed)
[employer] - [place] at [facility]                                                                                                                                                                                  Patient Demographics   Dylan [name], is a 83 y.o. male, DOB - [birth year], ZOX:[id number]  Admit date - 03/28/2019   Admitting Physician Arnaldo Natal, MD  Outpatient Primary MD for the patient is [name], MD   LOS - 2  Subjective: Patient extubated on high flow oxygen hard to understand him daughter-in-law at bedside    Review of Systems:   CONSTITUTIONAL: Limited due to patient being confused  Vitals:   Vitals:   03/30/19 1319 03/30/19 1400 03/30/19 1401 03/30/19 1500  BP:  (!) 160/75  (!) 170/107  Pulse: 87 87 88 86  Resp: (!) 30 (!) 26 (!) 25 (!) 21  Temp:  98.2 F (36.8 C)    TempSrc:  Oral    SpO2: 99% 99% 100% 100%  Weight:      Height:        Wt Readings from Last 3 Encounters:  03/29/19 128.8 kg  02/21/19 130 kg  02/03/19 120.7 kg     Intake/Output Summary (Last 24 hours) at 03/30/2019 1526 Last data filed at 03/30/2019 1319 Gross per 24 hour  Intake 358.04 ml  Output 1075 ml  Net -716.96 ml    Physical Exam:   GENERAL: Critically ill on high flow oxygen HEAD, EYES, EARS, NOSE AND THROAT: Atraumatic, normocephalic. Extraocular muscles are intact. Pupils equal and reactive to light. Sclerae anicteric. No conjunctival injection. No oro-pharyngeal erythema.  NECK: Supple. There is no jugular venous distention. No bruits, no lymphadenopathy, no thyromegaly.  HEART: Regular rate and rhythm,. No murmurs, no rubs, no clicks.  LUNGS: Decreased breath sounds bilateral.  ABDOMEN: Soft, flat, nontender, nondistended. Has good bowel sounds. No hepatosplenomegaly appreciated.  EXTREMITIES: Positive edema  nEUROLOGIC: Confused  sKIN: Moist and warm with no rashes appreciated.  Psych: Confused LN: No inguinal LN enlargement     Antibiotics   Anti-infectives (From admission, onward)   Start     Dose/Rate Route Frequency Ordered Stop   03/29/19 2200  vancomycin (VANCOCIN) 1,500 mg in sodium chloride 0.9 % 500 mL IVPB  Status:  Discontinued     1,500 mg 250 mL/hr over 120 Minutes Intravenous Every 24 hours 03/28/19 2225 03/29/19 1158   03/29/19 1000  cefTRIAXone (ROCEPHIN) 1 g in sodium chloride 0.9 % 100 mL IVPB     1 g 200 mL/hr over 30 Minutes Intravenous Every 24 hours 03/29/19 0917     03/28/19 2200  vancomycin (VANCOCIN) 2,500 mg in sodium chloride 0.9 % 500 mL IVPB     2,500 mg 250 mL/hr over 120 Minutes Intravenous  Once 03/28/19 2138 03/29/19 0001   03/28/19 0200  cefTRIAXone (ROCEPHIN) 2 g in sodium chloride 0.9 % 100 mL IVPB  Status:  Discontinued     2 g 200 mL/hr over 30 Minutes Intravenous Every 24 hours 03/28/19 0159 03/28/19 2211   03/28/19 0200  azithromycin (ZITHROMAX) 500 mg in sodium chloride 0.9 % 250 mL IVPB  Status:  Discontinued     500 mg 250 mL/hr over 60 Minutes Intravenous Every 24 hours 03/28/19 0159 03/28/19 2211      Medications   Scheduled Meds: . Chlorhexidine Gluconate Cloth  6 each Topical Q0600  . [START ON 03/31/2019] enoxaparin (LOVENOX) injection  40 mg Subcutaneous Daily  . feeding supplement (ENSURE ENLIVE)  237 mL Oral TID BM  . furosemide  20 mg Intravenous BID  . ipratropium-albuterol  3 mL Nebulization Q6H  . [START ON 03/31/2019] multivitamin-lutein  1 capsule Oral Daily  . mupirocin ointment   Nasal BID  . pantoprazole  40 mg Oral Daily   Continuous Infusions: . sodium chloride 0 mL/hr at 03/29/19 1707  . cefTRIAXone (ROCEPHIN)  IV Stopped (03/30/19 1012)   PRN Meds:.sodium chloride, acetaminophen **OR** acetaminophen, hydrALAZINE, ondansetron **OR** ondansetron (ZOFRAN) IV, traMADol, traZODone   Data Review:   Micro Results Recent Results (from the past 240 hour(s))  Blood Culture (routine x 2)     Status: Abnormal   Collection Time: 03/28/19  2:19  AM   Specimen: BLOOD  Result Value Ref Range Status   Specimen Description   Final    BLOOD RIGHT ANTECUBITAL Performed at [facility], [address] [place], [place] [address]    Special Requests   Final    BOTTLES DRAWN AEROBIC AND ANAEROBIC Blood Culture adequate volume Performed at [facility], [address] [place], [place] [address]    Culture  Setup Time   Final    IN BOTH AEROBIC AND ANAEROBIC BOTTLES GRAM POSITIVE COCCI CRITICAL RESULT CALLED TO, READ BACK BY AND VERIFIED WITH: ASAJAH DUNCAN ON 03/28/19 AT 2108 [facility]    Culture (A)  Final    STAPHYLOCOCCUS SPECIES (COAGULASE NEGATIVE) THE SIGNIFICANCE OF ISOLATING THIS ORGANISM FROM A SINGLE SET OF BLOOD CULTURES WHEN MULTIPLE SETS ARE DRAWN IS UNCERTAIN. PLEASE NOTIFY THE MICROBIOLOGY DEPARTMENT WITHIN ONE WEEK IF SPECIATION AND SENSITIVITIES ARE REQUIRED. Performed at [facility], [address] [place], [place] [address]    Report Status 03/30/2019 FINAL  Final  Blood Culture (routine x 2)     Status: None (Preliminary result)   Collection Time: 03/28/19  2:19 AM   Specimen: BLOOD  Result Value Ref Range Status   Specimen Description BLOOD RIGHT ARM  Final   Special Requests   Final    BOTTLES DRAWN AEROBIC AND ANAEROBIC Blood Culture results may not be optimal due to an excessive volume of blood received in culture bottles   Culture   Final    NO GROWTH 2 DAYS Performed at [facility], [address] [place], [place] [address]    Report Status PENDING  Incomplete  Urine culture     Status: Abnormal (Preliminary result)   Collection Time: 03/28/19  2:19 AM   Specimen: Urine, Random  Result Value Ref Range Status   Specimen Description   Final    URINE, RANDOM Performed at [facility],  [address] [place], [place] [address]    Special Requests   Final    NONE Performed at [facility], [address] [place], [place] [address]    Culture  >=100,000 COLONIES/mL ESCHERICHIA COLI (A)  Final   Report Status PENDING  Incomplete  SARS Coronavirus 2 (CEPHEID- Performed in [facility]), Hosp Order     Status: None   Collection Time: 03/28/19  2:19 AM   Specimen: Urine, Unspecified Source; Nasopharyngeal  Result Value Ref Range Status   SARS Coronavirus 2 NEGATIVE NEGATIVE Final    Comment: (NOTE) If result is NEGATIVE SARS-CoV-2 target nucleic acids are NOT DETECTED. The SARS-CoV-2 RNA is generally detectable in upper and lower  respiratory specimens during the acute phase of infection. The lowest  concentration of SARS-CoV-2 viral copies this assay can detect is 250  copies / mL. A negative result does not preclude SARS-CoV-2 infection  and should not be used as the sole basis for treatment or other  patient management decisions.  A negative result may occur with  improper specimen collection / handling, submission of specimen other  than nasopharyngeal swab, presence of viral mutation(s) within the  areas targeted by this assay, and inadequate number of viral copies  (<250 copies / mL). A negative result must be combined with clinical  observations, patient history, and epidemiological information. If result is POSITIVE SARS-CoV-2 target nucleic acids are DETECTED. The SARS-CoV-2 RNA is generally detectable in upper and lower  respiratory specimens dur ing the acute phase of infection.  Positive  results are indicative of active infection with SARS-CoV-2.  Clinical  correlation with patient history and other diagnostic information is  necessary to determine patient infection status.  Positive results do  not rule out bacterial infection or co-infection with other viruses. If result is PRESUMPTIVE POSTIVE SARS-CoV-2 nucleic acids MAY BE PRESENT.   A presumptive positive result was obtained on the submitted specimen  and confirmed on repeat testing.  While 2019 novel coronavirus  (SARS-CoV-2) nucleic acids may  be present in the submitted sample  additional confirmatory testing may be necessary for epidemiological  and / or clinical management purposes  to differentiate between  SARS-CoV-2 and other Sarbecovirus currently known to infect humans.  If clinically indicated additional testing with an alternate test  methodology [phone]([id number]) is advised. The SARS-CoV-2 RNA is generally  detectable in upper and lower respiratory sp ecimens during the acute  phase of infection. The expected result is Negative. Fact Sheet for Patients:  BoilerBrush.com.cyhttps://www.fda.gov/media/136312/download Fact Sheet for Healthcare Providers: https://pope.com/https://www.fda.gov/media/136313/download This test is not yet approved or cleared by the [place] FDA and has been authorized for detection and/or diagnosis of SARS-CoV-2 by FDA under an Emergency Use Authorization (EUA).  This EUA will remain in effect (meaning this test can be used) for the duration of the COVID-19 declaration under Section 564(b)(1) of the Act, 21 U.S.C. section 360bbb-3(b)(1), unless the authorization is terminated or revoked sooner. Performed at [facility], [address] [place], [place] [address]   Blood Culture ID Panel (Reflexed)     Status: Abnormal   Collection Time: 03/28/19  2:19 AM  Result Value Ref Range Status   Enterococcus species NOT DETECTED NOT DETECTED Final   Listeria monocytogenes NOT DETECTED NOT DETECTED Final   Staphylococcus species DETECTED (A) NOT DETECTED Final    Comment: Methicillin (oxacillin) resistant coagulase negative staphylococcus. Possible blood culture contaminant (unless isolated from more than one blood culture draw or clinical  case suggests pathogenicity). No antibiotic treatment is indicated for blood  culture contaminants. CRITICAL RESULT CALLED TO, READ BACK BY AND VERIFIED WITH: ASAJAH DUNCAN ON 03/28/19 AT 2108 [facility]    Staphylococcus aureus (BCID) NOT DETECTED NOT DETECTED Final   Methicillin resistance  DETECTED (A) NOT DETECTED Final    Comment: CRITICAL RESULT CALLED TO, READ BACK BY AND VERIFIED WITH: ASAJAH DUNCAN ON 03/28/19 AT 2108 [facility]    Streptococcus species NOT DETECTED NOT DETECTED Final   Streptococcus agalactiae NOT DETECTED NOT DETECTED Final   Streptococcus pneumoniae NOT DETECTED NOT DETECTED Final   Streptococcus pyogenes NOT DETECTED NOT DETECTED Final   Acinetobacter baumannii NOT DETECTED NOT DETECTED Final   Enterobacteriaceae species NOT DETECTED NOT DETECTED Final   Enterobacter cloacae complex NOT DETECTED NOT DETECTED Final   Escherichia coli NOT DETECTED NOT DETECTED Final   Klebsiella oxytoca NOT DETECTED NOT DETECTED Final   Klebsiella pneumoniae NOT DETECTED NOT DETECTED Final   Proteus species NOT DETECTED NOT DETECTED Final   Serratia marcescens NOT DETECTED NOT DETECTED Final   Haemophilus influenzae NOT DETECTED NOT DETECTED Final   Neisseria meningitidis NOT DETECTED NOT DETECTED Final   Pseudomonas aeruginosa NOT DETECTED NOT DETECTED Final   Candida albicans NOT DETECTED NOT DETECTED Final   Candida glabrata NOT DETECTED NOT DETECTED Final   Candida krusei NOT DETECTED NOT DETECTED Final   Candida parapsilosis NOT DETECTED NOT DETECTED Final   Candida tropicalis NOT DETECTED NOT DETECTED Final    Comment: Performed at [facility], [address] [place], [place] [address]  MRSA PCR Screening     Status: Abnormal   Collection Time: 03/28/19  5:47 AM   Specimen: Nasal Mucosa; Nasopharyngeal  Result Value Ref Range Status   MRSA by PCR POSITIVE (A) NEGATIVE Final    Comment:        The GeneXpert MRSA Assay (FDA approved for NASAL specimens only), is one component of a comprehensive MRSA colonization surveillance program. It is not intended to diagnose MRSA infection nor to guide or monitor treatment for MRSA infections. RESULT CALLED TO, READ BACK BY AND VERIFIED WITH: KAYLA FOSTER @0731  ON 03/28/2019 BY FMW Performed at [facility], [address] [place], [place] [address]     Radiology Reports Dg Chest 1 View  Result Date: 03/29/2019 CLINICAL DATA:  83 year old male with respiratory failure. EXAM: CHEST  1 VIEW COMPARISON:  03/28/2019 and earlier. FINDINGS: Portable AP semi upright view at 1558 hours. Extubated and enteric tube removed. Progressive, complete opacification of the left hemithorax since the CT on 02/21/2019 which demonstrated large left and moderate right layering pleural effusions with atelectasis. No superimposed pneumothorax. Right lung pulmonary vascularity appears stable since June. Mediastinal contours are obscured. No acute osseous abnormality identified. IMPRESSION: 1. Extubated and enteric tube removed. 2. Progressive, complete opacification of the left hemithorax since 02/21/2019 suspected due to worsening combined pleural effusions and atelectasis. Recommend confirmation of fluid by ultrasound prior to any attempted thoracentesis. Electronically Signed   By: Odessa Fleming M.D.   On: 03/29/2019 16:58   Dg Chest Port 1 View  Result Date: 03/28/2019 CLINICAL DATA:  Intubation EXAM: PORTABLE CHEST 1 VIEW COMPARISON:  02/23/2019 FINDINGS: The endotracheal tube tip just below the level of clavicular heads. Orogastric tube side port projects over the gastric body. Patient is markedly rotated to the left, obscuring most of the left hemithorax. Visualized right lung is clear. IMPRESSION: Radiographically appropriate position of endotracheal 2. Electronically Signed  By: Deatra RobinsonKevin  Herman M.D.   On: 03/28/2019 02:34     CBC Recent Labs  Lab 03/28/19 0219 03/29/19 0519 03/30/19 0446  WBC 10.9* 9.4 11.2*  HGB 10.9* 10.4* 11.2*  HCT 36.9* 33.8* 36.0*  PLT 188 166 173  MCV 103.7* 99.7 99.4  MCH 30.6 30.7 30.9  MCHC 29.5* 30.8 31.1  RDW 17.2* 17.6* 17.7*  LYMPHSABS 0.6* 1.6 1.4  MONOABS 0.5 0.6 0.8  EOSABS 0.2 0.1 0.2  BASOSABS 0.1 0.0 0.0    Chemistries  Recent Labs  Lab 03/28/19 0219  03/29/19 0519 03/30/19 0446  NA 143 146* 146*  K 4.8 3.6 3.7  CL 101 105 103  CO2 31 31 29   GLUCOSE 188* 99 88  BUN 34* 34* 32*  CREATININE 1.14 1.14 1.13  CALCIUM 8.4* 8.4* 8.7*  MG  --  2.5* 2.4  AST 49* 37  --   ALT 28 23  --   ALKPHOS 148* 107  --   BILITOT 0.8 0.9  --    ------------------------------------------------------------------------------------------------------------------ estimated creatinine clearance is 70.1 mL/min (by C-G formula based on SCr of 1.13 mg/dL). ------------------------------------------------------------------------------------------------------------------ No results for input(s): HGBA1C in the last 72 hours. ------------------------------------------------------------------------------------------------------------------ No results for input(s): CHOL, HDL, LDLCALC, TRIG, CHOLHDL, LDLDIRECT in the last 72 hours. ------------------------------------------------------------------------------------------------------------------ Recent Labs    03/28/19 0219  TSH 49.284*   ------------------------------------------------------------------------------------------------------------------ No results for input(s): VITAMINB12, FOLATE, FERRITIN, TIBC, IRON, RETICCTPCT in the last 72 hours.  Coagulation profile Recent Labs  Lab 03/28/19 0219  INR 1.1    No results for input(s): DDIMER in the last 72 hours.  Cardiac Enzymes No results for input(s): CKMB, TROPONINI, MYOGLOBIN in the last 168 hours.  Invalid input(s): CK ------------------------------------------------------------------------------------------------------------------ Invalid input(s): POCBNP    Assessment & Plan   This is an 83 year old male admitted for respiratory failure. 1.  Respiratory failure: Acute on chronic; with hypoxia.  Due to multifactorial, acute CHF and acute COPD exasperation continue IV Lasix and nebulizer therapy 2.  COPD: End-stage; schedule albuterol  treatments.  3.  CAD: Troponin is elevated likely due to demand ischemia  4.  Hypertension: Hold antihypertensive agents at this time.  5.  DVT prophylaxis: Lovenox 6.  GI prophylaxis: PPI after 24 hours intubation      Code Status Orders  (From admission, onward)         Start     Ordered   03/28/19 0517  Full code  Continuous     03/28/19 0516        Code Status History    Date Active Date Inactive Code Status Order ID Comments User Context   02/21/2019 1125 02/24/2019 2310 Full Code [id number]  Alford HighlandWieting, Richard, MD ED   01/30/2019 1805 02/03/2019 1754 Full Code [id number]  Jama Flavorsjie, Jude, MD ED   11/23/2018 0619 12/01/2018 1541 Full Code [id number]  Arnaldo Nataliamond, Michael S, MD Inpatient   11/14/2018 0314 11/19/2018 1822 Full Code [id number]  Mansy, Vernetta HoneyJan A, MD ED   10/27/2018 2101 10/31/2018 2110 Partial Code [id number]  Altamese DillingVachhani, Vaibhavkumar, MD Inpatient   08/26/2018 1137 09/04/2018 1912 Full Code [id number]  Kennedy BuckerMenz, Michael, MD ED   06/21/2018 1321 06/24/2018 2008 Partial Code [id number]  Milagros LollSudini, Srikar, MD Inpatient   06/21/2018 0803 06/21/2018 1321 Full Code [id number]  Iran OuchArida, Muhammad A, MD Inpatient   06/01/2018 2009 06/04/2018 1718 Full Code [id number]  Donato HeinzHooten, James P, MD Inpatient   01/11/2018 0012 01/14/2018 1908 Full Code [id number]  Cammy CopaMaier, Angela, MD Inpatient   12/01/2017 1924 12/05/2017 1948  Full Code [id number]  Vaughan Basta, MD Inpatient   08/31/2017 0557 09/05/2017 1959 Full Code [id number]  Harrie Foreman, MD Inpatient   06/10/2016 1934 06/13/2016 1816 Full Code [id number]  Demetrios Loll, MD Inpatient   12/15/2015 0134 12/16/2015 1507 Full Code [id number]  Saundra Shelling, MD ED   Advance Care Planning Activity           Consults pulmonary critical care  DVT Prophylaxis Lovenox Lab Results  Component Value Date   PLT 173 03/30/2019     Time Spent in minutes  37min Greater than 50% of time spent in care coordination and counseling patient regarding the condition and plan of  care.   Dustin Flock M.D on 03/30/2019 at 3:26 PM  Between 7am to 6pm - Pager - [phone]  After 6pm go to [email or web address] - Proofreader  [employer]   Office  [phone]

## 2019-03-30 NOTE — Progress Notes (Signed)
CRITICAL CARE NOTE      CHIEF COMPLAINT:   Acute hypoxemic respiratory failure   SUBJECTIVE FINDINGS & SIGNIFICANT EVENTS    83 year old male with acute exacerbation of CHF likely due to UTI, was intubated due to severe hypoxemia.  -patient seems to be with altered mentation likely due to Urinary tract infection as well as sedation recirculating in context of CKD post liberation from mechanical ventilation.  .  - Palliative care NP evaluated patient this am patient wishes to be full code but started hospice  -Urine culture positive for E. coli and blood culture positive for methicillin-resistant staph epidermidis.   Patient is currently on Rocephin  -advancing diet today, bedside swallow good   PAST MEDICAL HISTORY   Past Medical History:  Diagnosis Date  . CAD (coronary artery disease)   . Cervicalgia   . CHF (congestive heart failure) (HCC)   . COPD (chronic obstructive pulmonary disease) (HCC)   . Diastolic heart failure (HCC)   . Foot drop, right   . History of kidney stones   . Hyperlipidemia    unspecified  . Hypertension   . Myocardial infarction (HCC)   . Osteoarthritis   . Shoulder pain, left   . Sleep apnea   . Tremor, essential      SURGICAL HISTORY   Past Surgical History:  Procedure Laterality Date  . APPLICATION OF WOUND VAC Right 11/13/2017   Procedure: APPLICATION OF WOUND VAC;  Surgeon: Annice Needyew, Jason S, MD;  Location: [facility];  Service: Vascular;  Laterality: Right;  . BACK SURGERY  06/2010  . CARDIAC CATHETERIZATION Left 04/30/2016   Procedure: Left Heart Cath and Coronary Angiography;  Surgeon: Laurier NancyShaukat A Khan, MD;  Location: [facility];  Service: Cardiovascular;  Laterality: Left;  . COLONOSCOPY    . CORONARY ANGIOPLASTY    . CORONARY/GRAFT ACUTE MI  REVASCULARIZATION N/A 06/21/2018   Procedure: Coronary/Graft Acute MI Revascularization;  Surgeon: Iran OuchArida, Muhammad A, MD;  Location: [facility];  Service: Cardiovascular;  Laterality: N/A;  . KNEE ARTHROPLASTY Left 06/01/2018   Procedure: COMPUTER ASSISTED TOTAL KNEE ARTHROPLASTY;  Surgeon: Donato HeinzHooten, James P, MD;  Location: [facility];  Service: Orthopedics;  Laterality: Left;  . KNEE ARTHROSCOPY Left   . KNEE SURGERY Left 1998  . LEFT HEART CATH AND CORONARY ANGIOGRAPHY N/A 06/21/2018   Procedure: LEFT HEART CATH AND CORONARY ANGIOGRAPHY;  Surgeon: Iran OuchArida, Muhammad A, MD;  Location: [facility];  Service: Cardiovascular;  Laterality: N/A;  . ORIF FEMUR FRACTURE Left 08/27/2018   Procedure: OPEN REDUCTION INTERNAL FIXATION (ORIF) SUPRACONDYLAR FEMUR FRACTURE ABOVE PROSTHESIS, QUADRICEPS REPAIR;  Surgeon: Kennedy BuckerMenz, Michael, MD;  Location: [facility];  Service: Orthopedics;  Laterality: Left;  . TEE WITHOUT CARDIOVERSION N/A 11/18/2018   Procedure: TRANSESOPHAGEAL ECHOCARDIOGRAM (TEE);  Surgeon: Lamar BlinksKowalski, Bruce J, MD;  Location: [facility];  Service: Cardiovascular;  Laterality: N/A;  . TONSILLECTOMY    . WOUND DEBRIDEMENT Right 11/13/2017   Procedure: DEBRIDEMENT WOUND;  Surgeon: Annice Needyew, Jason S, MD;  Location: [facility];  Service: Vascular;  Laterality: Right;     FAMILY HISTORY   Family History  Problem Relation Age of Onset  . Diabetes Son   . Cancer Mother   . Colon cancer Mother   . Lung cancer Mother   . Tremor Father   . Heart disease Father   . Tremor Brother   . Bladder Cancer Brother   . Tremor Sister      SOCIAL HISTORY   Social  History   Tobacco Use  . Smoking status: Former Smoker    Packs/day: 0.25    Years: 58.00    Pack years: 14.50    Types: Cigarettes  . Smokeless tobacco: Former NeurosurgeonUser  . Tobacco comment: quit the begginning of septembet 2019  Substance Use Topics  . Alcohol use: No    Alcohol/week: 0.0 standard drinks  . Drug use: No      MEDICATIONS   Current Medication:  Current Facility-Administered Medications:  .  0.9 %  sodium chloride infusion, , Intravenous, PRN, Arnaldo Nataliamond, Michael S, MD, Last Rate: 0 mL/hr at 03/29/19 1707 .  acetaminophen (TYLENOL) tablet 650 mg, 650 mg, Oral, Q6H PRN **OR** acetaminophen (TYLENOL) suppository 650 mg, 650 mg, Rectal, Q6H PRN, Arnaldo Nataliamond, Michael S, MD .  cefTRIAXone (ROCEPHIN) 1 g in sodium chloride 0.9 % 100 mL IVPB, 1 g, Intravenous, Q24H, Vida RiggerAleskerov, Tamma Brigandi, MD, Stopped at 03/29/19 1025 .  chlorhexidine (PERIDEX) 0.12 % solution 15 mL, 15 mL, Mouth Rinse, BID, Blakeney, Neldon Newportana G, NP .  Chlorhexidine Gluconate Cloth 2 % PADS 6 each, 6 each, Topical, Q0600, Eugenie NorrieBlakeney, Dana G, NP, 6 each at 03/30/19 0239 .  enoxaparin (LOVENOX) injection 40 mg, 40 mg, Subcutaneous, Q12H, Arnaldo Nataliamond, Michael S, MD, 40 mg at 03/29/19 2109 .  furosemide (LASIX) injection 20 mg, 20 mg, Intravenous, BID, Bertram SavinSimpson, Michael L, RPH, 20 mg at 03/30/19 0734 .  hydrALAZINE (APRESOLINE) injection 10-20 mg, 10-20 mg, Intravenous, Q4H PRN, Eugenie NorrieBlakeney, Dana G, NP, 20 mg at 03/30/19 [id number] .  ipratropium-albuterol (DUONEB) 0.5-2.5 (3) MG/3ML nebulizer solution 3 mL, 3 mL, Nebulization, Q6H, Karna ChristmasAleskerov, Angelino Rumery, MD, 3 mL at 03/30/19 0728 .  MEDLINE mouth rinse, 15 mL, Mouth Rinse, q12n4p, Blakeney, Neldon Newportana G, NP .  mupirocin ointment (BACTROBAN) 2 %, , Nasal, BID, Tukov-Yual, Magdalene S, NP .  ondansetron (ZOFRAN) tablet 4 mg, 4 mg, Oral, Q6H PRN **OR** ondansetron (ZOFRAN) injection 4 mg, 4 mg, Intravenous, Q6H PRN, Arnaldo Nataliamond, Michael S, MD .  pantoprazole (PROTONIX) EC tablet 40 mg, 40 mg, Oral, Daily, Bertram SavinSimpson, Michael L, RPH .  traZODone (DESYREL) tablet 50 mg, 50 mg, Oral, QHS PRN, Eugenie NorrieBlakeney, Dana G, NP    ALLERGIES   Patient has no known allergies.    REVIEW OF SYSTEMS     Unable to obtain due to mild confusion post sedation.  PHYSICAL EXAMINATION   Vitals:   03/30/19 0728 03/30/19 0800  BP:  (!) 195/93  Pulse: 100 (!) 104   Resp: (!) 30 (!) 29  Temp:  98.6 F (37 C)  SpO2: 99% 96%    GENERAL: No apparent distress HEAD: Normocephalic, atraumatic.  EYES: Pupils equal, round, reactive to light.  No scleral icterus.  MOUTH: Moist mucosal membrane. NECK: Supple. No thyromegaly. No nodules. No JVD.  PULMONARY: Decreased breath sounds bilaterally CARDIOVASCULAR: S1 and S2. Regular rate and rhythm. No murmurs, rubs, or gallops.  GASTROINTESTINAL: Soft, nontender, non-distended. No masses. Positive bowel sounds. No hepatosplenomegaly.  MUSCULOSKELETAL: No swelling, clubbing, or edema.  NEUROLOGIC: Mild distress due to acute illness SKIN:intact,warm,dry   LABS AND IMAGING       LAB RESULTS: Recent Labs  Lab 03/28/19 0219 03/29/19 0519 03/30/19 0446  NA 143 146* 146*  K 4.8 3.6 3.7  CL 101 105 103  CO2 31 31 29   BUN 34* 34* 32*  CREATININE 1.14 1.14 1.13  GLUCOSE 188* 99 88   Recent Labs  Lab 03/28/19 0219 03/29/19 0519 03/30/19 0446  HGB 10.9* 10.4* 11.2*  HCT 36.9*  33.8* 36.0*  WBC 10.9* 9.4 11.2*  PLT 188 166 173     IMAGING RESULTS: Dg Chest 1 View  Result Date: 03/29/2019 CLINICAL DATA:  83 year old male with respiratory failure. EXAM: CHEST  1 VIEW COMPARISON:  03/28/2019 and earlier. FINDINGS: Portable AP semi upright view at 1558 hours. Extubated and enteric tube removed. Progressive, complete opacification of the left hemithorax since the CT on 02/21/2019 which demonstrated large left and moderate right layering pleural effusions with atelectasis. No superimposed pneumothorax. Right lung pulmonary vascularity appears stable since June. Mediastinal contours are obscured. No acute osseous abnormality identified. IMPRESSION: 1. Extubated and enteric tube removed. 2. Progressive, complete opacification of the left hemithorax since 02/21/2019 suspected due to worsening combined pleural effusions and atelectasis. Recommend confirmation of fluid by ultrasound prior to any attempted  thoracentesis. Electronically Signed   By: Genevie Ann M.D.   On: 03/29/2019 16:58      ASSESSMENT AND PLAN    -Multidisciplinary rounds held today  Acute Hypoxic Respiratory Failure -Due to decompensated heart failure with preserved ejection fraction as evidenced by severe hypoxemia with lower extremity edema, increased JVD, with elevated BNP. -Patient is status post diuresis and successful liberation from mechanical ventilation -weaning HFNC today     COPD  -Continue COPD care path  -Incentive spirometer at bedside  -nightly BiPAP-patient uses trilogy device at home  ID  -Urinary tract infection due to E. Coli-continue Rocephin  -Methicillin-resistant staph epidermidis-likely contaminant -vancomycin stopped -continue IV abx as prescibed -follow up cultures   GI/Nutrition GI PROPHYLAXIS as indicated DIET-->TF's as tolerated Constipation protocol as indicated  ENDO - ICU hypoglycemic\Hyperglycemia protocol -check FSBS per protocol   ELECTROLYTES -follow labs as needed -replace as needed -pharmacy consultation   DVT/GI PRX ordered -SCDs  TRANSFUSIONS AS NEEDED MONITOR FSBS ASSESS the need for LABS as needed   Critical care provider statement:    Critical care time (minutes):  32   Critical care time was exclusive of:  Separately billable procedures and treating other patients   Critical care was necessary to treat or prevent imminent or life-threatening deterioration of the following conditions:   Acute hypoxemic respiratory failure, acute decompensated diastolic CHF, COPD, morbid obesity, multiple comorbid conditions.   Critical care was time spent personally by me on the following activities:  Development of treatment plan with patient or surrogate, discussions with consultants, evaluation of patient's response to treatment, examination of patient, obtaining history from patient or surrogate, ordering and performing treatments and interventions, ordering and review  of laboratory studies and re-evaluation of patient's condition.  I assumed direction of critical care for this patient from another provider in my specialty: no    This document was prepared using Dragon voice recognition software and may include unintentional dictation errors.    Ottie Glazier, M.D.  Division of [place]

## 2019-03-30 NOTE — Progress Notes (Signed)
Pt consistently removes HFNC.  On room air with sats 94%.

## 2019-03-30 NOTE — Progress Notes (Signed)
Nutrition Follow-up  DOCUMENTATION CODES:   Obesity unspecified  INTERVENTION:  Provide Ensure Enlive po TID, each supplement provides 350 kcal and 20 grams of protein.  Provide Magic cup TID with meals, each supplement provides 290 kcal and 9 grams of protein.  Provide Ocuvite daily for wound healing (provides zinc, vitamin A, vitamin C, Vitamin E, copper, and selenium).  NUTRITION DIAGNOSIS:   Inadequate oral intake related to inability to eat as evidenced by NPO status.  Resolving - diet has been advanced.  GOAL:   Patient will meet greater than or equal to 90% of their needs  Progressing with diet advancement.  MONITOR:   PO intake, Supplement acceptance, Labs, Weight trends, I & O's, Skin  REASON FOR ASSESSMENT:   Ventilator    ASSESSMENT:   83 year old male with PMHx of HTN, HLD, CAD, CHF, hx MI, COPD, sleep apnea admitted with acute hypoxic respiratory failure due to a combination of CHF exacerbation and COPD exacerbation requiring intubation on 7/26.  Patient was extubated yesterday morning. Patient has been restless and confused. Currently on contact precautions. Diet was advanced this morning to soft with thin liquids. Will monitor adequacy of intake. Patient has had decreased PO intake PTA.  Medications reviewed and include: Lasix 20 mg BID IV, pantoprazole, ceftriaxone.  Labs reviewed: CBG 82-89, Sodium 146, BUN 32.  Diet Order:   Diet Order            DIET SOFT Room service appropriate? Yes; Fluid consistency: Thin  Diet effective now             EDUCATION NEEDS:   No education needs have been identified at this time  Skin:  Skin Assessment: Skin Integrity Issues:(stage II buttocks)  Last BM:  Unknown  Height:   Ht Readings from Last 1 Encounters:  03/28/19 5\' 11"  (1.803 m)   Weight:   Wt Readings from Last 1 Encounters:  03/29/19 128.8 kg   Ideal Body Weight:  78.2 kg  BMI:  Body mass index is 39.6 kg/m.  Estimated Nutritional  Needs:   Kcal:  1422-1810 (11-14 kcal/kg)  Protein:  156 grams (2 grams/kg IBW)  Fluid:  per MD  Willey Blade, MS, RD, LDN Office: [phone] Pager: [phone] After Hours/Weekend Pager: [phone]

## 2019-03-31 DIAGNOSIS — Z7189 Other specified counseling: Secondary | ICD-10-CM

## 2019-03-31 LAB — BASIC METABOLIC PANEL
Anion gap: 10 (ref 5–15)
BUN: 33 mg/dL — ABNORMAL HIGH (ref 8–23)
CO2: 29 mmol/L (ref 22–32)
Calcium: 8.4 mg/dL — ABNORMAL LOW (ref 8.9–10.3)
Chloride: 106 mmol/L (ref 98–111)
Creatinine, Ser: 1 mg/dL (ref 0.61–1.24)
GFR calc Af Amer: >60 mL/min (ref >60)
GFR calc non Af Amer: >60 mL/min (ref >60)
Glucose, Bld: 121 mg/dL — ABNORMAL HIGH (ref 70–99)
Potassium: 3.4 mmol/L — ABNORMAL LOW (ref 3.5–5.1)
Sodium: 145 mmol/L (ref 135–145)

## 2019-03-31 LAB — URINE CULTURE: Culture: >=100000 — AB

## 2019-03-31 LAB — MAGNESIUM: Magnesium: 2.3 mg/dL (ref 1.7–2.4)

## 2019-03-31 LAB — GLUCOSE, CAPILLARY
Glucose-Capillary: 103 mg/dL — ABNORMAL HIGH (ref 70–99)
Glucose-Capillary: 159 mg/dL — ABNORMAL HIGH (ref 70–99)
Glucose-Capillary: 195 mg/dL — ABNORMAL HIGH (ref 70–99)

## 2019-03-31 MED ORDER — MORPHINE SULFATE (PF) 2 MG/ML IV SOLN
0.5000 mg | INTRAVENOUS | Status: DC | PRN
  Administered 2019-03-31 – 2019-04-01 (×4): 0.5 mg via INTRAVENOUS
  Filled 2019-03-31 (×4): qty 1

## 2019-03-31 MED ORDER — IPRATROPIUM-ALBUTEROL 0.5-2.5 (3) MG/3ML IN SOLN
3.0000 mL | Freq: Three times a day (TID) | RESPIRATORY_TRACT | Status: DC
  Administered 2019-03-31 – 2019-04-02 (×6): 3 mL via RESPIRATORY_TRACT
  Filled 2019-03-31 (×7): qty 3

## 2019-03-31 MED ORDER — POTASSIUM CHLORIDE CRYS ER 20 MEQ PO TBCR
40.0000 meq | EXTENDED_RELEASE_TABLET | Freq: Once | ORAL | Status: AC
  Administered 2019-03-31: 40 meq via ORAL
  Filled 2019-03-31: qty 2

## 2019-03-31 MED ORDER — FUROSEMIDE 10 MG/ML IJ SOLN
40.0000 mg | Freq: Two times a day (BID) | INTRAMUSCULAR | Status: DC
  Administered 2019-03-31 – 2019-04-02 (×4): 40 mg via INTRAVENOUS
  Filled 2019-03-31 (×4): qty 4

## 2019-03-31 MED ORDER — ALBUMIN HUMAN 25 % IV SOLN
12.5000 g | Freq: Once | INTRAVENOUS | Status: AC
  Administered 2019-03-31: 12.5 g via INTRAVENOUS
  Filled 2019-03-31: qty 50

## 2019-03-31 MED ORDER — POTASSIUM CHLORIDE CRYS ER 20 MEQ PO TBCR: 40.0000 meq | EXTENDED_RELEASE_TABLET | Freq: Once | ORAL | Status: DC

## 2019-03-31 MED ORDER — POTASSIUM CHLORIDE 10 MEQ/100ML IV SOLN
10.0000 meq | INTRAVENOUS | Status: AC
  Administered 2019-03-31 (×2): 10 meq via INTRAVENOUS
  Filled 2019-03-31 (×2): qty 100

## 2019-03-31 MED ORDER — MIDODRINE HCL 5 MG PO TABS
10.0000 mg | ORAL_TABLET | Freq: Three times a day (TID) | ORAL | Status: DC
  Administered 2019-03-31 – 2019-04-02 (×6): 10 mg via ORAL
  Filled 2019-03-31 (×6): qty 2

## 2019-03-31 MED ORDER — ALBUTEROL SULFATE (2.5 MG/3ML) 0.083% IN NEBU: 2.5000 mg | INHALATION_SOLUTION | RESPIRATORY_TRACT | Status: DC | PRN

## 2019-03-31 MED ORDER — SODIUM CHLORIDE 0.9 % IV SOLN
1.0000 g | Freq: Three times a day (TID) | INTRAVENOUS | Status: DC
  Administered 2019-03-31 – 2019-04-01 (×3): 1 g via INTRAVENOUS
  Filled 2019-03-31 (×5): qty 1

## 2019-03-31 NOTE — Plan of Care (Signed)

## 2019-03-31 NOTE — Progress Notes (Signed)
Attempt to place BiPAP mask after morphine for resp distress. Pt did not tol, pt stated "take it off I cant stand it" multiple times. Pt placed back on 2Lnc

## 2019-03-31 NOTE — Progress Notes (Signed)
[facility]  SUBJECTIVE: With baseline dementia, it is difficult to obtain history from patient, but he has no complaints this morning. He states that he has "felt better" before. He denies chest pain or shortness of breath.   Vitals:   03/31/19 0600 03/31/19 0620 03/31/19 0700 03/31/19 0737  BP: (!) 92/59  111/60   Pulse: 84 83 88 90  Resp: (!) 28 (!) 25 (!) 24 (!) 24  Temp:      TempSrc:      SpO2: 98% 97% 99% 99%  Weight:      Height:         Intake/Output Summary (Last 24 hours) at 03/31/2019 0802 Last data filed at 03/31/2019 0701 Gross per 24 hour  Intake 232.22 ml  Output 1050 ml  Net -817.78 ml      PHYSICAL EXAM  General: Chronically ill appearing, in no acute distress HEENT:  Normocephalic and atramatic Neck:  No JVD.  Lungs: Difficult to hear as patient would not take deep breaths; no wheezes audible Heart: HRRR . Normal S1 and S2 without gallops or murmurs.  Abdomen: nondistended Msk:  Back normal, gait not assessed. No obvious deformity. Extremities: 1+ bilateral lower extremity edema with pretibial discoloration of skin   Neuro: Alert, demented    LABS: Basic Metabolic Panel: Recent Labs    03/29/19 0519 03/30/19 0446 03/31/19 0411  NA 146* 146* 145  K 3.6 3.7 3.4*  CL 105 103 106  CO2 31 29 29   GLUCOSE 99 88 121*  BUN 34* 32* 33*  CREATININE 1.14 1.13 1.00  CALCIUM 8.4* 8.7* 8.4*  MG 2.5* 2.4 2.3  PHOS 3.1 4.0  --    Liver Function Tests: Recent Labs    03/29/19 0519  AST 37  ALT 23  ALKPHOS 107  BILITOT 0.9  PROT 5.7*  ALBUMIN 2.4*   No results for input(s): LIPASE, AMYLASE in the last 72 hours. CBC: Recent Labs    03/29/19 0519 03/30/19 0446  WBC 9.4 11.2*  NEUTROABS 7.0 8.6*  HGB 10.4* 11.2*  HCT 33.8* 36.0*  MCV 99.7 99.4  PLT 166 173   Cardiac Enzymes: No results for input(s): CKTOTAL, CKMB, CKMBINDEX, TROPONINI in the last 72 hours. BNP: Invalid input(s): POCBNP D-Dimer: No results for input(s): DDIMER in  the last 72 hours. Hemoglobin A1C: No results for input(s): HGBA1C in the last 72 hours. Fasting Lipid Panel: No results for input(s): CHOL, HDL, LDLCALC, TRIG, CHOLHDL, LDLDIRECT in the last 72 hours. Thyroid Function Tests: No results for input(s): TSH, T4TOTAL, T3FREE, THYROIDAB in the last 72 hours.  Invalid input(s): FREET3 Anemia Panel: No results for input(s): VITAMINB12, FOLATE, FERRITIN, TIBC, IRON, RETICCTPCT in the last 72 hours.  Dg Chest 1 View  Result Date: 03/29/2019 CLINICAL DATA:  83 year old male with respiratory failure. EXAM: CHEST  1 VIEW COMPARISON:  03/28/2019 and earlier. FINDINGS: Portable AP semi upright view at 1558 hours. Extubated and enteric tube removed. Progressive, complete opacification of the left hemithorax since the CT on 02/21/2019 which demonstrated large left and moderate right layering pleural effusions with atelectasis. No superimposed pneumothorax. Right lung pulmonary vascularity appears stable since June. Mediastinal contours are obscured. No acute osseous abnormality identified. IMPRESSION: 1. Extubated and enteric tube removed. 2. Progressive, complete opacification of the left hemithorax since 02/21/2019 suspected due to worsening combined pleural effusions and atelectasis. Recommend confirmation of fluid by ultrasound prior to any attempted thoracentesis. Electronically Signed   By: Odessa FlemingH  Hall M.D.   On: 03/29/2019  16:58     Echo 11/18/2018 revealed LVEF 35-40% with global hypokinesis  TELEMETRY: sinus rhythm, 80s  ASSESSMENT AND PLAN:  Active Problems:   Acute on chronic respiratory failure with hypoxemia (HCC)   Pressure injury of skin   Chronic obstructive pulmonary disease with acute lower respiratory infection ([place])    1. Acute on chronic hypoxic respiratory failure secondary to acute on chronic systolic CHF and acute COPD exacerbation with severe end-stage COPD, currently on IV Lasix 2. Elevated troponin, likely demand supply ischemia  in the setting of acute on chronic CHF and COPD exacerbation, with known coronary artery disease,status post inferior MI in 06/2018, status post 2 overlapping DES in RCA, on aspirin only due to history of brain bleed. ECG with nonspecific T wave abnormalities. Troponin 229, 434, followed by 385. 3. End-stage COPD, on home oxygen, currently on nasal cannula, in Hospice care 4. Hypertension, antihypertensive medications being held at this time due to hypotension 5. UTI, on antibiotics  Recommendations: 1. Continue IV Lasix 20 mg BID with careful monitoring of renal status 2. Continue aspirin  3. Consider resuming low dose carvedilol if blood pressure permits 4. Defer further cardiac diagnostics at this time   Clabe Seal, Hershal Coria 03/31/2019 8:02 AM   Sign off for now; please call with any questions.

## 2019-03-31 NOTE — Progress Notes (Signed)
Daily Progress Note   Patient Name: Dylan [name]       Date: 03/31/2019 DOB: Sep 18, 1935  Age: 83 y.o. MRN#: [id number] Attending Physician: Saundra Shelling, MD Primary Care Physician: Albina Billet, MD Admit Date: 03/28/2019  Reason for Consultation/Follow-up: Establishing goals of care  Subjective: Patient awake, alert to person/place. Episode of respiratory distress/tachypnea this afternoon requiring prn morphine and BiPAP. Pt did not tolerate BiPAP and remains on nasal cannula.    GOC:  No family at bedside during visit. Frankly and compassionately explained seriousness of current respiratory status with Dylan [name], including what his wishes would be if he further declined. Explained re-intubation versus comfort focused pathway. Explained concern that with re-intubation, he may not be able to successfully extubate again with underlying heart and lung conditions. Dylan [name] tells me he needs to "talk to my family" about this decision.  Spoke with wife Dylan [name]) and son Dylan [name]) via telephone to update on clinical status this afternoon. Strongly encouraged wife to come to the hospital and discuss [facility] with her husband this afternoon. She does not get off work until [employer] but plans to come at this time. She asks if son, Dylan [name] can join. PMT provider will allow two family members to visit at bedside because patient's children are from previous marriage. Dylan [name] and Dylan [name] to visit this evening to discuss code status/goals with [name].   Dylan [name] also plans to arrive around 8am tomorrow, 7/30 to f/u with PMT provider. Answered questions and concerns.   Updated RN, Dr. Lanney Gins, and PMT provider, Vinie Sill.   Length of Stay: 3  Current Medications: Scheduled Meds:  . Chlorhexidine Gluconate Cloth  6  each Topical Q0600  . enoxaparin (LOVENOX) injection  40 mg Subcutaneous Daily  . feeding supplement (ENSURE ENLIVE)  237 mL Oral TID BM  . furosemide  40 mg Intravenous BID  . ipratropium-albuterol  3 mL Nebulization TID  . midodrine  10 mg Oral TID  . multivitamin-lutein  1 capsule Oral Daily  . mupirocin ointment   Nasal BID  . pantoprazole  40 mg Oral Daily    Continuous Infusions: . sodium chloride 5 mL/hr at 03/31/19 1400  . cefTRIAXone (ROCEPHIN)  IV Stopped (03/31/19 1043)    PRN Meds: sodium chloride, acetaminophen **OR** acetaminophen, albuterol, morphine injection, ondansetron **OR** ondansetron (ZOFRAN) IV,  QUEtiapine, traMADol, traZODone  Physical Exam Vitals signs and nursing note reviewed.  Constitutional:      General: He is awake.     Appearance: He is ill-appearing.  HENT:     Head: Normocephalic and atraumatic.  Cardiovascular:     Rate and Rhythm: Normal rate.  Pulmonary:     Effort: Tachypnea and accessory muscle usage present.     Breath sounds: Decreased breath sounds present.     Comments: Slight improvement in respiratory status s/p Morphine. Pt refusing BiPAP and remains on nasal cannula.  Abdominal:     Tenderness: There is no abdominal tenderness.  Skin:    General: Skin is warm and dry.     Coloration: Skin is pale.  Neurological:     Mental Status: He is alert and oriented to person, place, and time.            Vital Signs: BP (!) 173/108   Pulse 82   Temp 97.9 F (36.6 C)   Resp 20   Ht 5\' 11"  (1.803 m)   Wt 125 kg   SpO2 100%   BMI 38.43 kg/m  SpO2: SpO2: 100 % O2 Device: O2 Device: Nasal Cannula O2 Flow Rate: O2 Flow Rate (L/min): 2 L/min  Intake/output summary:   Intake/Output Summary (Last 24 hours) at 03/31/2019 1442 Last data filed at 03/31/2019 1400 Gross per 24 hour  Intake 811.42 ml  Output 900 ml  Net -88.58 ml   LBM: Last BM Date: (PTA) Baseline Weight: Weight: 136.1 kg Most recent weight: Weight: 125 kg        Palliative Assessment/Data: PPS 30%      Patient Active Problem List   Diagnosis Date Noted  . Chronic obstructive pulmonary disease with acute lower respiratory infection (HCC)   . Acute on chronic respiratory failure with hypoxemia (HCC) 03/28/2019  . Pressure injury of skin 03/28/2019  . Acute respiratory failure with hypoxemia (HCC) 11/23/2018  . Goals of care, counseling/discussion   . Palliative care by specialist   . DNR (do not resuscitate) discussion   . Acute CHF (congestive heart failure) (HCC) 11/14/2018  . Acute on chronic systolic CHF (congestive heart failure) (HCC) 10/27/2018  . HCAP (healthcare-associated pneumonia) 10/27/2018  . Hypokalemia 10/01/2018  . Hypertensive heart and kidney disease with acute on chronic diastolic congestive heart failure and stage 3 chronic kidney disease (HCC) 09/09/2018  . Chronic constipation 09/09/2018  . BPH with obstruction/lower urinary tract symptoms 09/09/2018  . Obstructive sleep apnea of adult 09/09/2018  . Idiopathic peripheral neuropathy 09/09/2018  . Chronic kidney disease, stage 3, mod decreased GFR (HCC) 09/09/2018  . Anemia of chronic renal failure, stage 3 (moderate) (HCC) 09/09/2018  . Periprosthetic fracture around internal prosthetic left knee joint 08/26/2018  . Acute ST elevation myocardial infarction (STEMI) of inferior wall (HCC) 06/21/2018  . S/P total knee arthroplasty 06/01/2018  . Primary osteoarthritis of left knee 02/05/2018  . Sepsis (HCC) 01/10/2018  . Community acquired pneumonia 12/01/2017  . Pneumonia 12/01/2017  . Hyperlipidemia 09/23/2017  . Weakness 08/31/2017  . Benign essential tremor 10/18/2016  . Class 3 severe obesity due to excess calories with serious comorbidity and body mass index (BMI) of 45.0 to 49.9 in adult (HCC) 10/18/2016  . Varicose veins of lower extremities with ulcer (HCC) 06/17/2016  . Chronic venous insufficiency 06/17/2016  . Lymphedema 06/17/2016  . Acute on chronic  diastolic heart failure (HCC) 06/10/2016  . COPD, severe (HCC) 05/24/2016  . AV block, [place] 1  02/09/2016  . Coronary atherosclerosis of native coronary artery 02/09/2016  . ST elevation MI (STEMI) (HCC) 02/08/2016  . Right foot drop 04/07/2014  . Rotator cuff rupture, complete 04/22/2013  . Neck pain 04/14/2013    Palliative Care Assessment & Plan   Patient Profile: 83 y.o. male  with past medical history of chronic respiratory failure, COPD on home oxygen and trilogy, CHF EF 35-40%, CAD, MI, HTN, HLD, OSA, hip fracture December 2019, current hospice patient admitted on 03/28/2019 with unresponsiveness and hypoxia. Patient required intubation and ICU admission for acute hypoxic respiratory failure with underlying COPD and CHF. Extubated to [facility] 7/27. Urine culture positive for E. Coli receiving IV antibiotics. Followed by [facility]. Palliative medicine for goals of care.  Assessment: Acute on chronic respiratory failure UTI due to E.Coli COPD CHF EF 35-40% CAD Hypertension  Recommendations/Plan:  Continue FULL code/FULL scope treatment. Pending further patient/family discussions regarding GOC. Patient with respiratory distress this afternoon. High risk for decompensation and reintubation. Lift visitor restriction this evening to allow wife Jeral Pinch([name] Acevedo) and son Burna Cash([name] Quinteros) to visit patient at bedside to discuss GOC. Re-intubation if necessary vs. DNR/comfort focused care.  PMT provider will f/u with family in AM.   Current hospice patient followed by [facility].    Code Status: FULL   Code Status Orders  (From admission, onward)         Start     Ordered   03/28/19 0517  Full code  Continuous     03/28/19 0516        Code Status History    Date Active Date Inactive Code Status Order ID Comments User Context   02/21/2019 1125 02/24/2019 2310 Full Code [id number]  Alford HighlandWieting, Richard, MD ED   01/30/2019 1805 02/03/2019 1754 Full Code  [id number]  Jama Flavorsjie, Jude, MD ED   11/23/2018 0619 12/01/2018 1541 Full Code [id number]  Arnaldo Nataliamond, Michael S, MD Inpatient   11/14/2018 0314 11/19/2018 1822 Full Code [id number]  Mansy, Vernetta HoneyJan A, MD ED   10/27/2018 2101 10/31/2018 2110 Partial Code [id number]  Altamese DillingVachhani, Vaibhavkumar, MD Inpatient   08/26/2018 1137 09/04/2018 1912 Full Code [id number]  Kennedy BuckerMenz, Michael, MD ED   06/21/2018 1321 06/24/2018 2008 Partial Code [id number]  Milagros LollSudini, Srikar, MD Inpatient   06/21/2018 0803 06/21/2018 1321 Full Code [id number]  Iran OuchArida, Muhammad A, MD Inpatient   06/01/2018 2009 06/04/2018 1718 Full Code [id number]  Donato HeinzHooten, James P, MD Inpatient   01/11/2018 0012 01/14/2018 1908 Full Code [id number]  Cammy CopaMaier, Angela, MD Inpatient   12/01/2017 1924 12/05/2017 1948 Full Code [id number]  Altamese DillingVachhani, Vaibhavkumar, MD Inpatient   08/31/2017 0557 09/05/2017 1959 Full Code [id number]  Arnaldo Nataliamond, Michael S, MD Inpatient   06/10/2016 1934 06/13/2016 1816 Full Code [id number]  Shaune Pollackhen, Qing, MD Inpatient   12/15/2015 0134 12/16/2015 1507 Full Code [id number]  Ihor AustinPyreddy, Pavan, MD ED   Advance Care Planning Activity       Prognosis:  Poor prognosis with acute on chronic respiratory failure, COPD, CHF  Discharge Planning:  To Be Determined  Care plan was discussed with patient, RN, Dr. Karna ChristmasAleskerov, hospice liaison, wife, son  Thank you for allowing the Palliative Medicine Team to assist in the care of this patient.   Time In: 1400 Time Out: 1445 Total Time 45 Prolonged Time Billed no       Greater than 50%  of this time was spent counseling and coordinating care related to the above assessment and plan.  Vennie HomansMegan Saharra Santo, FNP-C Palliative Medicine Team  Phone:  [phone] Fax: [phone]  Please contact Palliative Medicine Team phone at [phone] for questions and concerns.

## 2019-03-31 NOTE — Progress Notes (Addendum)
Shift summary:  - Unable to exchange Foley as I am unable to see patient's urethral meatus. Will leave current Foley in place.  - Patient tolerated BiPAP only ~ 5 mins despite PRN Morphine.  - Evening CBG not obtained as patient began eating prior to RN able to check CBG.   - Family wants to meet with Hospice RN tomorrow as a group to discuss goals of care.

## 2019-03-31 NOTE — Consult Note (Signed)
[facility] Nurse wound consult note Reason for Consult: full thickness wound on RGT Wound type:traumatic Pressure Injury POA: N/A Measurement:1cm x 2cm x 0.2cm Wound bed:red, moist Drainage (amount, consistency, odor) scant to small serosanguinous Periwound: intact, clear Dressing procedure/placement/frequency: I have provided Nursing with guidance for the care of the RGT using xeroform gauze as a wound contact layer and securing with a few turns of conform bandaging. Daily changes are indicated.  [facility] nursing team will not follow, but will remain available to this patient, the nursing and medical teams.  Please re-consult if needed. Thanks, Maudie Flakes, MSN, RN, [place], Arther Abbott  Pager# [phone]

## 2019-03-31 NOTE — Progress Notes (Addendum)
Pharmacy Electrolyte Monitoring Consult:  Pharmacy consulted to assist in monitoring and replacing electrolytes in this 83 y.o. male admitted on 03/28/2019.  Labs:  Sodium (mmol/[name])  Date Value  03/31/2019 145  12/02/2012 142   Potassium (mmol/[name])  Date Value  03/31/2019 3.4 ([name])  12/02/2012 3.1 ([name])   Magnesium (mg/dL)  Date Value  03/31/2019 2.3   Phosphorus (mg/dL)  Date Value  03/30/2019 4.0   Calcium (mg/dL)  Date Value  03/31/2019 8.4 ([name])   Calcium, Total (mg/dL)  Date Value  12/02/2012 8.2 ([name])   Albumin (g/dL)  Date Value  03/29/2019 2.4 ([name])  12/02/2012 3.3 ([name])    Assessment/Plan: Furosemide increased to 40mg  IV BID. Patient received potassium 39mEq IV Q1hr x 2 doses.   Will order potassium 12mEq PO x 1.  Will replace for goal potassium ~ 4, magnesium ~ 2.   Electrolytes with am labs.   Pharmacy will continue to monitor and adjust per consult.   Dylan [name],Dylan [name] 03/31/2019 4:25 PM

## 2019-03-31 NOTE — Progress Notes (Signed)
[employer] - [place] at [facility]                                                                                                                                                                                  Patient Demographics   Dylan [name], is a 83 y.o. male, DOB - 1935/10/06, ZOX:[id number]  Admit date - 03/28/2019   Admitting Physician Arnaldo NatalMichael S Diamond, MD  Outpatient Primary MD for the patient is Jaclyn Shaggyate, [name], MD   LOS - 3  Subjective: Patient currently on oxygen via nasal cannula Off ventilator Awake and responds to all verbal commands Oral intake better   Review of Systems:  Review of Systems  Constitutional: Has generalized weakness HENT: Negative.   Eyes: Negative.   Respiratory: Mild shortness of breath, occasional cough Cardiovascular: Negative.   Gastrointestinal: Negative.   Genitourinary: Negative.   Musculoskeletal: Swelling in the lower extremity Skin: Negative.   Neurological: Negative.   Endo/Heme/Allergies: Negative.   Psychiatric/Behavioral: Negative.   All other systems reviewed and are negative.  Vitals:   Vitals:   03/31/19 0800 03/31/19 0900 03/31/19 1000 03/31/19 1100  BP: 90/68 (!) 96/42 (!) 156/73 (!) 157/79  Pulse: 82 86 83 85  Resp: (!) 27 (!) 28 (!) 26 20  Temp: 98.5 F (36.9 C)   97.9 F (36.6 C)  TempSrc: Oral     SpO2: 100% 98% 100% 100%  Weight:      Height:        Wt Readings from Last 3 Encounters:  03/31/19 125 kg  02/21/19 130 kg  02/03/19 120.7 kg     Intake/Output Summary (Last 24 hours) at 03/31/2019 1323 Last data filed at 03/31/2019 1140 Gross per 24 hour  Intake 586.46 ml  Output 900 ml  Net -313.54 ml    Physical Exam:   GENERAL: Critically ill on high flow oxygen HEAD, EYES, EARS, NOSE AND THROAT: Atraumatic, normocephalic. Extraocular muscles are intact. Pupils equal and reactive to light. Sclerae anicteric. No conjunctival injection. No oro-pharyngeal erythema.  NECK: Supple.  There is no jugular venous distention. No bruits, no lymphadenopathy, no thyromegaly.  HEART: Regular rate and rhythm,. No murmurs, no rubs, no clicks.  LUNGS: Decreased breath sounds bilateral.  ABDOMEN: Soft, flat, nontender, nondistended. Has good bowel sounds. No hepatosplenomegaly appreciated.  EXTREMITIES: Positive edema  nEUROLOGIC: Confused  sKIN: Moist and warm with no rashes appreciated.  Psych: Confused LN: No inguinal LN enlargement    Antibiotics   Anti-infectives (From admission, onward)   Start     Dose/Rate Route Frequency Ordered Stop   03/29/19 2200  vancomycin (VANCOCIN) 1,500 mg in sodium chloride 0.9 % 500 mL  IVPB  Status:  Discontinued     1,500 mg 250 mL/hr over 120 Minutes Intravenous Every 24 hours 03/28/19 2225 03/29/19 1158   03/29/19 1000  cefTRIAXone (ROCEPHIN) 1 g in sodium chloride 0.9 % 100 mL IVPB     1 g 200 mL/hr over 30 Minutes Intravenous Every 24 hours 03/29/19 0917     03/28/19 2200  vancomycin (VANCOCIN) 2,500 mg in sodium chloride 0.9 % 500 mL IVPB     2,500 mg 250 mL/hr over 120 Minutes Intravenous  Once 03/28/19 2138 03/29/19 0001   03/28/19 0200  cefTRIAXone (ROCEPHIN) 2 g in sodium chloride 0.9 % 100 mL IVPB  Status:  Discontinued     2 g 200 mL/hr over 30 Minutes Intravenous Every 24 hours 03/28/19 0159 03/28/19 2211   03/28/19 0200  azithromycin (ZITHROMAX) 500 mg in sodium chloride 0.9 % 250 mL IVPB  Status:  Discontinued     500 mg 250 mL/hr over 60 Minutes Intravenous Every 24 hours 03/28/19 0159 03/28/19 2211      Medications   Scheduled Meds: . Chlorhexidine Gluconate Cloth  6 each Topical Q0600  . enoxaparin (LOVENOX) injection  40 mg Subcutaneous Daily  . feeding supplement (ENSURE ENLIVE)  237 mL Oral TID BM  . furosemide  40 mg Intravenous BID  . ipratropium-albuterol  3 mL Nebulization TID  . midodrine  10 mg Oral TID  . multivitamin-lutein  1 capsule Oral Daily  . mupirocin ointment   Nasal BID  . pantoprazole  40  mg Oral Daily   Continuous Infusions: . sodium chloride 250 mL (03/31/19 1007)  . cefTRIAXone (ROCEPHIN)  IV 1 g (03/31/19 1008)   PRN Meds:.sodium chloride, acetaminophen **OR** acetaminophen, albuterol, morphine injection, ondansetron **OR** ondansetron (ZOFRAN) IV, QUEtiapine, traMADol, traZODone   Data Review:   Micro Results Recent Results (from the past 240 hour(s))  Blood Culture (routine x 2)     Status: Abnormal   Collection Time: 03/28/19  2:19 AM   Specimen: BLOOD  Result Value Ref Range Status   Specimen Description   Final    BLOOD RIGHT ANTECUBITAL Performed at [facility], [address], [place], [place] [address]    Special Requests   Final    BOTTLES DRAWN AEROBIC AND ANAEROBIC Blood Culture adequate volume Performed at [facility], [address] [place], [place] [address]    Culture  Setup Time   Final    IN BOTH AEROBIC AND ANAEROBIC BOTTLES GRAM POSITIVE COCCI CRITICAL RESULT CALLED TO, READ BACK BY AND VERIFIED WITH: ASAJAH DUNCAN ON 03/28/19 AT 2108 [facility]    Culture (A)  Final    STAPHYLOCOCCUS SPECIES (COAGULASE NEGATIVE) THE SIGNIFICANCE OF ISOLATING THIS ORGANISM FROM A SINGLE SET OF BLOOD CULTURES WHEN MULTIPLE SETS ARE DRAWN IS UNCERTAIN. PLEASE NOTIFY THE MICROBIOLOGY DEPARTMENT WITHIN ONE WEEK IF SPECIATION AND SENSITIVITIES ARE REQUIRED. Performed at [facility], [place] [address], [place], [place] [address]    Report Status 03/30/2019 FINAL  Final  Blood Culture (routine x 2)     Status: None (Preliminary result)   Collection Time: 03/28/19  2:19 AM   Specimen: BLOOD  Result Value Ref Range Status   Specimen Description BLOOD RIGHT ARM  Final   Special Requests   Final    BOTTLES DRAWN AEROBIC AND ANAEROBIC Blood Culture results may not be optimal due to an excessive volume of blood received in culture bottles   Culture   Final    NO  GROWTH 3 DAYS Performed at [facility], [address]  [place], [place] [address]    Report Status PENDING  Incomplete  Urine culture     Status: Abnormal (Preliminary result)   Collection Time: 03/28/19  2:19 AM   Specimen: Urine, Random  Result Value Ref Range Status   Specimen Description   Final    URINE, RANDOM Performed at [facility], [address] [place], [place] [address]    Special Requests   Final    NONE Performed at [facility], [address] [place], [place] [address]    Culture >=100,000 COLONIES/mL ESCHERICHIA COLI (A)  Final   Report Status PENDING  Incomplete  SARS Coronavirus 2 (CEPHEID- Performed in [facility] hospital lab), Hosp Order     Status: None   Collection Time: 03/28/19  2:19 AM   Specimen: Urine, Unspecified Source; Nasopharyngeal  Result Value Ref Range Status   SARS Coronavirus 2 NEGATIVE NEGATIVE Final    Comment: (NOTE) If result is NEGATIVE SARS-CoV-2 target nucleic acids are NOT DETECTED. The SARS-CoV-2 RNA is generally detectable in upper and lower  respiratory specimens during the acute phase of infection. The lowest  concentration of SARS-CoV-2 viral copies this assay can detect is 250  copies / mL. A negative result does not preclude SARS-CoV-2 infection  and should not be used as the sole basis for treatment or other  patient management decisions.  A negative result may occur with  improper specimen collection / handling, submission of specimen other  than nasopharyngeal swab, presence of viral mutation(s) within the  areas targeted by this assay, and inadequate number of viral copies  (<250 copies / mL). A negative result must be combined with clinical  observations, patient history, and epidemiological information. If result is POSITIVE SARS-CoV-2 target nucleic acids are DETECTED. The SARS-CoV-2 RNA is generally detectable in upper and lower  respiratory specimens dur ing the acute phase of infection.  Positive  results are indicative of active infection with  SARS-CoV-2.  Clinical  correlation with patient history and other diagnostic information is  necessary to determine patient infection status.  Positive results do  not rule out bacterial infection or co-infection with other viruses. If result is PRESUMPTIVE POSTIVE SARS-CoV-2 nucleic acids MAY BE PRESENT.   A presumptive positive result was obtained on the submitted specimen  and confirmed on repeat testing.  While 2019 novel coronavirus  (SARS-CoV-2) nucleic acids may be present in the submitted sample  additional confirmatory testing may be necessary for epidemiological  and / or clinical management purposes  to differentiate between  SARS-CoV-2 and other Sarbecovirus currently known to infect humans.  If clinically indicated additional testing with an alternate test  methodology [phone] is advised. The SARS-CoV-2 RNA is generally  detectable in upper and lower respiratory sp ecimens during the acute  phase of infection. The expected result is Negative. Fact Sheet for Patients:  BoilerBrush.com.cyhttps://www.fda.gov/media/136312/download Fact Sheet for Healthcare Providers: https://pope.com/https://www.fda.gov/media/136313/download This test is not yet approved or cleared by the [place] FDA and has been authorized for detection and/or diagnosis of SARS-CoV-2 by FDA under an Emergency Use Authorization (EUA).  This EUA will remain in effect (meaning this test can be used) for the duration of the COVID-19 declaration under Section 564(b)(1) of the Act, 21 U.S.C. section 360bbb-3(b)(1), unless the authorization is terminated or revoked sooner. Performed at [facility], [address] [place], [place] [address]   Blood Culture ID Panel (Reflexed)  Status: Abnormal   Collection Time: 03/28/19  2:19 AM  Result Value Ref Range Status   Enterococcus species NOT DETECTED NOT DETECTED Final   Listeria monocytogenes NOT DETECTED NOT DETECTED Final   Staphylococcus species DETECTED (A) NOT DETECTED  Final    Comment: Methicillin (oxacillin) resistant coagulase negative staphylococcus. Possible blood culture contaminant (unless isolated from more than one blood culture draw or clinical case suggests pathogenicity). No antibiotic treatment is indicated for blood  culture contaminants. CRITICAL RESULT CALLED TO, READ BACK BY AND VERIFIED WITH: ASAJAH DUNCAN ON 03/28/19 AT 2108 [facility]    Staphylococcus aureus (BCID) NOT DETECTED NOT DETECTED Final   Methicillin resistance DETECTED (A) NOT DETECTED Final    Comment: CRITICAL RESULT CALLED TO, READ BACK BY AND VERIFIED WITH: ASAJAH DUNCAN ON 03/28/19 AT 2108 [facility]    Streptococcus species NOT DETECTED NOT DETECTED Final   Streptococcus agalactiae NOT DETECTED NOT DETECTED Final   Streptococcus pneumoniae NOT DETECTED NOT DETECTED Final   Streptococcus pyogenes NOT DETECTED NOT DETECTED Final   Acinetobacter baumannii NOT DETECTED NOT DETECTED Final   Enterobacteriaceae species NOT DETECTED NOT DETECTED Final   Enterobacter cloacae complex NOT DETECTED NOT DETECTED Final   Escherichia coli NOT DETECTED NOT DETECTED Final   Klebsiella oxytoca NOT DETECTED NOT DETECTED Final   Klebsiella pneumoniae NOT DETECTED NOT DETECTED Final   Proteus species NOT DETECTED NOT DETECTED Final   Serratia marcescens NOT DETECTED NOT DETECTED Final   Haemophilus influenzae NOT DETECTED NOT DETECTED Final   Neisseria meningitidis NOT DETECTED NOT DETECTED Final   Pseudomonas aeruginosa NOT DETECTED NOT DETECTED Final   Candida albicans NOT DETECTED NOT DETECTED Final   Candida glabrata NOT DETECTED NOT DETECTED Final   Candida krusei NOT DETECTED NOT DETECTED Final   Candida parapsilosis NOT DETECTED NOT DETECTED Final   Candida tropicalis NOT DETECTED NOT DETECTED Final    Comment: Performed at [facility], [address] [place], [place] [address]  MRSA PCR Screening     Status: Abnormal   Collection Time: 03/28/19  5:47 AM   Specimen: Nasal  Mucosa; Nasopharyngeal  Result Value Ref Range Status   MRSA by PCR POSITIVE (A) NEGATIVE Final    Comment:        The GeneXpert MRSA Assay (FDA approved for NASAL specimens only), is one component of a comprehensive MRSA colonization surveillance program. It is not intended to diagnose MRSA infection nor to guide or monitor treatment for MRSA infections. RESULT CALLED TO, READ BACK BY AND VERIFIED WITH: KAYLA FOSTER @0731  ON 03/28/2019 BY FMW Performed at [facility], [address], [place], [place] [address]     Radiology Reports Dg Chest 1 View  Result Date: 03/29/2019 CLINICAL DATA:  83 year old male with respiratory failure. EXAM: CHEST  1 VIEW COMPARISON:  03/28/2019 and earlier. FINDINGS: Portable AP semi upright view at 1558 hours. Extubated and enteric tube removed. Progressive, complete opacification of the left hemithorax since the CT on 02/21/2019 which demonstrated large left and moderate right layering pleural effusions with atelectasis. No superimposed pneumothorax. Right lung pulmonary vascularity appears stable since June. Mediastinal contours are obscured. No acute osseous abnormality identified. IMPRESSION: 1. Extubated and enteric tube removed. 2. Progressive, complete opacification of the left hemithorax since 02/21/2019 suspected due to worsening combined pleural effusions and atelectasis. Recommend confirmation of fluid by ultrasound prior to any attempted thoracentesis. Electronically Signed   By: Odessa Fleming M.D.   On: 03/29/2019 16:58   Dg Chest [facility]  1 View  Result Date: 03/28/2019 CLINICAL DATA:  Intubation EXAM: PORTABLE CHEST 1 VIEW COMPARISON:  02/23/2019 FINDINGS: The endotracheal tube tip just below the level of clavicular heads. Orogastric tube side port projects over the gastric body. Patient is markedly rotated to the left, obscuring most of the left hemithorax. Visualized right lung is clear. IMPRESSION: Radiographically appropriate position of  endotracheal 2. Electronically Signed   By: Deatra Robinson M.D.   On: 03/28/2019 02:34     CBC Recent Labs  Lab 03/28/19 0219 03/29/19 0519 03/30/19 0446  WBC 10.9* 9.4 11.2*  HGB 10.9* 10.4* 11.2*  HCT 36.9* 33.8* 36.0*  PLT 188 166 173  MCV 103.7* 99.7 99.4  MCH 30.6 30.7 30.9  MCHC 29.5* 30.8 31.1  RDW 17.2* 17.6* 17.7*  LYMPHSABS 0.6* 1.6 1.4  MONOABS 0.5 0.6 0.8  EOSABS 0.2 0.1 0.2  BASOSABS 0.1 0.0 0.0    Chemistries  Recent Labs  Lab 03/28/19 0219 03/29/19 0519 03/30/19 0446 03/31/19 0411  NA 143 146* 146* 145  K 4.8 3.6 3.7 3.4*  CL 101 105 103 106  CO2 31 31 29 29   GLUCOSE 188* 99 88 121*  BUN 34* 34* 32* 33*  CREATININE 1.14 1.14 1.13 1.00  CALCIUM 8.4* 8.4* 8.7* 8.4*  MG  --  2.5* 2.4 2.3  AST 49* 37  --   --   ALT 28 23  --   --   ALKPHOS 148* 107  --   --   BILITOT 0.8 0.9  --   --    ------------------------------------------------------------------------------------------------------------------ estimated creatinine clearance is 78 mL/min (by C-G formula based on SCr of 1 mg/dL). ------------------------------------------------------------------------------------------------------------------ No results for input(s): HGBA1C in the last 72 hours. ------------------------------------------------------------------------------------------------------------------ No results for input(s): CHOL, HDL, LDLCALC, TRIG, CHOLHDL, LDLDIRECT in the last 72 hours. ------------------------------------------------------------------------------------------------------------------ No results for input(s): TSH, T4TOTAL, T3FREE, THYROIDAB in the last 72 hours.  Invalid input(s): FREET3 ------------------------------------------------------------------------------------------------------------------ No results for input(s): VITAMINB12, FOLATE, FERRITIN, TIBC, IRON, RETICCTPCT in the last 72 hours.  Coagulation profile Recent Labs  Lab 03/28/19 0219  INR 1.1     No results for input(s): DDIMER in the last 72 hours.  Cardiac Enzymes No results for input(s): CKMB, TROPONINI, MYOGLOBIN in the last 168 hours.  Invalid input(s): CK ------------------------------------------------------------------------------------------------------------------ Invalid input(s): POCBNP    Assessment & Plan   This is an 83 year old male admitted for respiratory failure. 1.  S/P Respiratory failure: Acute on chronic; with hypoxia.  Due to multifactorial, acute CHF and acute COPD exasperation continue IV Lasix and nebulizer therapy. Oxygen via nasal canula. 2.  COPD: End-stage; scheduled albuterol treatments.  3.  CAD: Troponin is elevated likely due to demand ischemia , s/p cardiology f/u 4.  Hypertension: Hold antihypertensive agents at this time.  5.  DVT prophylaxis: Lovenox 6.  GI prophylaxis: PPI after 24 hours intubation      Code Status Orders  (From admission, onward)         Start     Ordered   03/28/19 0517  Full code  Continuous     03/28/19 0516        Code Status History    Date Active Date Inactive Code Status Order ID Comments User Context   02/21/2019 1125 02/24/2019 2310 Full Code [id number]  Alford Highland, MD ED   01/30/2019 1805 02/03/2019 1754 Full Code [id number]  Jama Flavors, MD ED   11/23/2018 0619 12/01/2018 1541 Full Code [id number]  Arnaldo Natal, MD Inpatient   11/14/2018  [id number] 11/19/2018 1822 Full Code [id number]  Hannah BeatMansy, Jan A, MD ED   10/27/2018 2101 10/31/2018 2110 Partial Code [id number]  Altamese DillingVachhani, Vaibhavkumar, MD Inpatient   08/26/2018 1137 09/04/2018 1912 Full Code [id number]  Kennedy BuckerMenz, Michael, MD ED   06/21/2018 1321 06/24/2018 2008 Partial Code [id number]  Milagros LollSudini, Srikar, MD Inpatient   06/21/2018 0803 06/21/2018 1321 Full Code [id number]  Iran OuchArida, Muhammad A, MD Inpatient   06/01/2018 2009 06/04/2018 1718 Full Code [id number]  Donato HeinzHooten, James P, MD Inpatient   01/11/2018 0012 01/14/2018 1908 Full Code [id number]  Cammy CopaMaier, Angela, MD  Inpatient   12/01/2017 1924 12/05/2017 1948 Full Code [id number]  Altamese DillingVachhani, Vaibhavkumar, MD Inpatient   08/31/2017 0557 09/05/2017 1959 Full Code [id number]  Arnaldo Nataliamond, Michael S, MD Inpatient   06/10/2016 1934 06/13/2016 1816 Full Code [id number]  Shaune Pollackhen, Qing, MD Inpatient   12/15/2015 0134 12/16/2015 1507 Full Code [id number]  Ihor AustinPyreddy, Cranford Blessinger, MD ED   Advance Care Planning Activity           Consults pulmonary critical care  DVT Prophylaxis Lovenox Lab Results  Component Value Date   PLT 173 03/30/2019     Time Spent in minutes  35min Greater than 50% of time spent in care coordination and counseling patient regarding the condition and plan of care.   Ihor AustinPavan Emina Ribaudo M.D on 03/31/2019 at 1:23 PM  Between 7am to 6pm - Pager - [phone]  After 6pm go to [email or web address] - Social research officer, [email or web address] [email or web address]  [employer]   Office  [phone]

## 2019-03-31 NOTE — Progress Notes (Signed)
Visit made. Patient more alert and able to converse in short bursts, very aware of his current situation. Through out the visit his respiratory rate remained in the 30's. Discussed with staff RN Aleene Davidson and pulmonologist Dr. Lanney Gins, patient agreeable to a trial of BIPAP. Respiratory therapist Fritzi Mandes in during visit. Bipap trial attempted, patient could not tolerate and repeatedly requested that it be taken off. Patient placed back on 2 liters nasal cannula. Much emotional support provided, ice chips and water given. patient calm at end of visit. Will continue to follow. Flo Shanks BSN, RN, [facility] [phone]

## 2019-03-31 NOTE — Progress Notes (Signed)
CRITICAL CARE NOTE      CHIEF COMPLAINT:   Acute hypoxemic respiratory failure   SUBJECTIVE FINDINGS & SIGNIFICANT EVENTS    83 year old male with acute exacerbation of CHF likely due to UTI, was intubated due to severe hypoxemia.  -patient refuses to wear BIPAP  - had a family conference at bedside today with 4 additional family members to discuss goals of care, he asked me to turn off bipap machine and they will think about code status.   - had meeting with hospice RN and palliative care NP  - Palliative care NP evaluated patient this am patient wishes to be full code but started hospice  -Urine culture positive for E. coli and blood culture positive for methicillin-resistant staph epidermidis.   Patient is currently on Rocephin   Continues to have labored breathing not responding to tramadol , have given small dose of morphine patient is more comfortable.     PAST MEDICAL HISTORY   Past Medical History:  Diagnosis Date  . CAD (coronary artery disease)   . Cervicalgia   . CHF (congestive heart failure) (HCC)   . COPD (chronic obstructive pulmonary disease) (HCC)   . Diastolic heart failure (HCC)   . Foot drop, right   . History of kidney stones   . Hyperlipidemia    unspecified  . Hypertension   . Myocardial infarction (HCC)   . Osteoarthritis   . Shoulder pain, left   . Sleep apnea   . Tremor, essential      SURGICAL HISTORY   Past Surgical History:  Procedure Laterality Date  . APPLICATION OF WOUND VAC Right 11/13/2017   Procedure: APPLICATION OF WOUND VAC;  Surgeon: Annice Needyew, Jason S, MD;  Location: [facility];  Service: Vascular;  Laterality: Right;  . BACK SURGERY  06/2010  . CARDIAC CATHETERIZATION Left 04/30/2016   Procedure: Left Heart Cath and Coronary Angiography;  Surgeon:  Laurier NancyShaukat A Khan, MD;  Location: [facility];  Service: Cardiovascular;  Laterality: Left;  . COLONOSCOPY    . CORONARY ANGIOPLASTY    . CORONARY/GRAFT ACUTE MI REVASCULARIZATION N/A 06/21/2018   Procedure: Coronary/Graft Acute MI Revascularization;  Surgeon: Iran OuchArida, Muhammad A, MD;  Location: [facility];  Service: Cardiovascular;  Laterality: N/A;  . KNEE ARTHROPLASTY Left 06/01/2018   Procedure: COMPUTER ASSISTED TOTAL KNEE ARTHROPLASTY;  Surgeon: Donato HeinzHooten, James P, MD;  Location: [facility];  Service: Orthopedics;  Laterality: Left;  . KNEE ARTHROSCOPY Left   . KNEE SURGERY Left 1998  . LEFT HEART CATH AND CORONARY ANGIOGRAPHY N/A 06/21/2018   Procedure: LEFT HEART CATH AND CORONARY ANGIOGRAPHY;  Surgeon: Iran OuchArida, Muhammad A, MD;  Location: [facility];  Service: Cardiovascular;  Laterality: N/A;  . ORIF FEMUR FRACTURE Left 08/27/2018   Procedure: OPEN REDUCTION INTERNAL FIXATION (ORIF) SUPRACONDYLAR FEMUR FRACTURE ABOVE PROSTHESIS, QUADRICEPS REPAIR;  Surgeon: Kennedy BuckerMenz, Michael, MD;  Location: [facility];  Service: Orthopedics;  Laterality: Left;  . TEE WITHOUT CARDIOVERSION N/A 11/18/2018   Procedure: TRANSESOPHAGEAL ECHOCARDIOGRAM (TEE);  Surgeon: Lamar BlinksKowalski, Bruce J, MD;  Location: [facility];  Service: Cardiovascular;  Laterality: N/A;  . TONSILLECTOMY    . WOUND DEBRIDEMENT Right 11/13/2017   Procedure: DEBRIDEMENT WOUND;  Surgeon: Annice Needyew, Jason S, MD;  Location: [facility];  Service: Vascular;  Laterality: Right;     FAMILY HISTORY   Family History  Problem Relation Age of Onset  . Diabetes Son   . Cancer Mother   . Colon cancer Mother   . Lung  cancer Mother   . Tremor Father   . Heart disease Father   . Tremor Brother   . Bladder Cancer Brother   . Tremor Sister      SOCIAL HISTORY   Social History   Tobacco Use  . Smoking status: Former Smoker    Packs/day: 0.25    Years: 58.00    Pack years: 14.50    Types: Cigarettes  . Smokeless tobacco: Former Systems developer  .  Tobacco comment: quit the begginning of septembet 2019  Substance Use Topics  . Alcohol use: No    Alcohol/week: 0.0 standard drinks  . Drug use: No     MEDICATIONS   Current Medication:  Current Facility-Administered Medications:  .  0.9 %  sodium chloride infusion, , Intravenous, PRN, Harrie Foreman, MD, Last Rate: 0 mL/hr at 03/29/19 1707 .  acetaminophen (TYLENOL) tablet 650 mg, 650 mg, Oral, Q6H PRN **OR** acetaminophen (TYLENOL) suppository 650 mg, 650 mg, Rectal, Q6H PRN, Harrie Foreman, MD .  albuterol (PROVENTIL) (2.5 MG/3ML) 0.083% nebulizer solution 2.5 mg, 2.5 mg, Nebulization, Q2H PRN, Lanney Gins, Sarrinah Gardin, MD .  cefTRIAXone (ROCEPHIN) 1 g in sodium chloride 0.9 % 100 mL IVPB, 1 g, Intravenous, Q24H, Ottie Glazier, MD, Stopped at 03/30/19 1012 .  Chlorhexidine Gluconate Cloth 2 % PADS 6 each, 6 each, Topical, Q0600, Awilda Bill, NP, 6 each at 03/31/19 0524 .  enoxaparin (LOVENOX) injection 40 mg, 40 mg, Subcutaneous, Daily, Charlett Nose, RPH .  feeding supplement (ENSURE ENLIVE) (ENSURE ENLIVE) liquid 237 mL, 237 mL, Oral, TID BM, Lenna Hagarty, MD, 237 mL at 03/30/19 2133 .  furosemide (LASIX) injection 20 mg, 20 mg, Intravenous, BID, Charlett Nose, RPH, 20 mg at 03/31/19 0805 .  hydrALAZINE (APRESOLINE) injection 10-20 mg, 10-20 mg, Intravenous, Q4H PRN, Awilda Bill, NP, 20 mg at 03/30/19 2141 .  ipratropium-albuterol (DUONEB) 0.5-2.5 (3) MG/3ML nebulizer solution 3 mL, 3 mL, Nebulization, TID, Lanney Gins, Makel Mcmann, MD .  multivitamin-lutein (OCUVITE-LUTEIN) capsule 1 capsule, 1 capsule, Oral, Daily, Desera Graffeo, MD .  mupirocin ointment (BACTROBAN) 2 %, , Nasal, BID, Tukov-Yual, Magdalene S, NP .  ondansetron (ZOFRAN) tablet 4 mg, 4 mg, Oral, Q6H PRN **OR** ondansetron (ZOFRAN) injection 4 mg, 4 mg, Intravenous, Q6H PRN, Harrie Foreman, MD .  pantoprazole (PROTONIX) EC tablet 40 mg, 40 mg, Oral, Daily, Charlett Nose, RPH, 40 mg at 03/30/19  0939 .  QUEtiapine (SEROQUEL) tablet 50 mg, 50 mg, Oral, QHS PRN, Awilda Bill, NP, 50 mg at 03/30/19 2246 .  traMADol (ULTRAM) tablet 50 mg, 50 mg, Oral, Q8H PRN, Basilio Cairo, NP, 50 mg at 03/31/19 0620 .  traZODone (DESYREL) tablet 50 mg, 50 mg, Oral, QHS PRN, Awilda Bill, NP, 50 mg at 03/30/19 2129    ALLERGIES   Patient has no known allergies.    REVIEW OF SYSTEMS     Unable to obtain due to mild confusion post sedation.  PHYSICAL EXAMINATION   Vitals:   03/31/19 0737 03/31/19 0800  BP:  90/68  Pulse: 90 82  Resp: (!) 24 (!) 27  Temp:  98.5 F (36.9 C)  SpO2: 99% 100%    GENERAL: No apparent distress HEAD: Normocephalic, atraumatic.  EYES: Pupils equal, round, reactive to light.  No scleral icterus.  MOUTH: Moist mucosal membrane. NECK: Supple. No thyromegaly. No nodules. No JVD.  PULMONARY: Decreased breath sounds bilaterally CARDIOVASCULAR: S1 and S2. Regular rate and rhythm. No murmurs, rubs, or gallops.  GASTROINTESTINAL:  Soft, nontender, non-distended. No masses. Positive bowel sounds. No hepatosplenomegaly.  MUSCULOSKELETAL: No swelling, clubbing, or edema.  NEUROLOGIC: Mild distress due to acute illness SKIN:intact,warm,dry   LABS AND IMAGING       LAB RESULTS: Recent Labs  Lab 03/29/19 0519 03/30/19 0446 03/31/19 0411  NA 146* 146* 145  K 3.6 3.7 3.4*  CL 105 103 106  CO2 31 29 29   BUN 34* 32* 33*  CREATININE 1.14 1.13 1.00  GLUCOSE 99 88 121*   Recent Labs  Lab 03/28/19 0219 03/29/19 0519 03/30/19 0446  HGB 10.9* 10.4* 11.2*  HCT 36.9* 33.8* 36.0*  WBC 10.9* 9.4 11.2*  PLT 188 166 173     IMAGING RESULTS: No results found.    ASSESSMENT AND PLAN    -Multidisciplinary rounds held today  Acute Hypoxic Respiratory Failure -Due to decompensated heart failure with preserved ejection fraction as evidenced by severe hypoxemia with lower extremity edema, increased JVD, with elevated BNP. -Patient is status post  diuresis and successful liberation from mechanical ventilation -weaning HFNC today  -goals of care meeting with family today and another meeting with hospice as well as discussion with palliative care today   COPD  -Continue COPD care path  -Incentive spirometer at bedside  -nightly BiPAP-patient uses trilogy device at home  ID  -Urinary tract infection due to E. Coli-continue Rocephin  -Methicillin-resistant staph epidermidis-likely contaminant -vancomycin stopped -continue IV abx as prescibed -follow up cultures   GI/Nutrition GI PROPHYLAXIS as indicated DIET-->TF's as tolerated Constipation protocol as indicated  ENDO - ICU hypoglycemic\Hyperglycemia protocol -check FSBS per protocol   ELECTROLYTES -follow labs as needed -replace as needed -pharmacy consultation   DVT/GI PRX ordered -SCDs  TRANSFUSIONS AS NEEDED MONITOR FSBS ASSESS the need for LABS as needed   Critical care provider statement:    Critical care time (minutes):  127   Critical care time was exclusive of:  Separately billable procedures and treating other patients   Critical care was necessary to treat or prevent imminent or life-threatening deterioration of the following conditions:   Acute hypoxemic respiratory failure, acute decompensated diastolic CHF, COPD, morbid obesity, multiple comorbid conditions.   Critical care was time spent personally by me on the following activities:  Development of treatment plan with patient or surrogate, discussions with consultants, evaluation of patient's response to treatment, examination of patient, obtaining history from patient or surrogate, ordering and performing treatments and interventions, ordering and review of laboratory studies and re-evaluation of patient's condition.  I assumed direction of critical care for this patient from another provider in my specialty: no    This document was prepared using Dragon voice recognition software and may include  unintentional dictation errors.    Vida RiggerFuad Zyire Eidson, M.D.  Division of Pulmonary & Critical Care Medicine  [facility]

## 2019-04-01 DIAGNOSIS — Z515 Encounter for palliative care: Secondary | ICD-10-CM

## 2019-04-01 DIAGNOSIS — Z7189 Other specified counseling: Secondary | ICD-10-CM

## 2019-04-01 DIAGNOSIS — R0602 Shortness of breath: Secondary | ICD-10-CM

## 2019-04-01 DIAGNOSIS — J9621 Acute and chronic respiratory failure with hypoxia: Principal | ICD-10-CM

## 2019-04-01 LAB — BASIC METABOLIC PANEL
Anion gap: 11 (ref 5–15)
BUN: 33 mg/dL — ABNORMAL HIGH (ref 8–23)
CO2: 28 mmol/L (ref 22–32)
Calcium: 8.7 mg/dL — ABNORMAL LOW (ref 8.9–10.3)
Chloride: 104 mmol/L (ref 98–111)
Creatinine, Ser: 0.92 mg/dL (ref 0.61–1.24)
GFR calc Af Amer: >60 mL/min (ref >60)
GFR calc non Af Amer: >60 mL/min (ref >60)
Glucose, Bld: 175 mg/dL — ABNORMAL HIGH (ref 70–99)
Potassium: 3.6 mmol/L (ref 3.5–5.1)
Sodium: 143 mmol/L (ref 135–145)

## 2019-04-01 LAB — GLUCOSE, CAPILLARY
Glucose-Capillary: 119 mg/dL — ABNORMAL HIGH (ref 70–99)
Glucose-Capillary: 120 mg/dL — ABNORMAL HIGH (ref 70–99)
Glucose-Capillary: 165 mg/dL — ABNORMAL HIGH (ref 70–99)

## 2019-04-01 MED ORDER — MORPHINE SULFATE (CONCENTRATE) 10 MG/0.5ML PO SOLN
10.0000 mg | ORAL | Status: DC | PRN
  Administered 2019-04-01: 10 mg via SUBLINGUAL
  Filled 2019-04-01: qty 1

## 2019-04-01 MED ORDER — MORPHINE SULFATE (PF) 2 MG/ML IV SOLN
2.0000 mg | Freq: Once | INTRAVENOUS | Status: AC
  Administered 2019-04-01: 2 mg via INTRAVENOUS
  Filled 2019-04-01: qty 1

## 2019-04-01 MED ORDER — HYDRALAZINE HCL 20 MG/ML IJ SOLN
10.0000 mg | Freq: Four times a day (QID) | INTRAMUSCULAR | Status: DC | PRN
  Administered 2019-04-01 (×3): 10 mg via INTRAVENOUS
  Filled 2019-04-01 (×3): qty 1

## 2019-04-01 MED ORDER — MORPHINE SULFATE (CONCENTRATE) 10 MG/0.5ML PO SOLN: 10.0000 mg | ORAL | Status: DC | PRN

## 2019-04-01 MED ORDER — MORPHINE SULFATE (CONCENTRATE) 10 MG/0.5ML PO SOLN
10.0000 mg | ORAL | Status: DC
  Administered 2019-04-01 – 2019-04-02 (×5): 10 mg via ORAL
  Filled 2019-04-01 (×5): qty 1

## 2019-04-01 MED ORDER — MORPHINE SULFATE (CONCENTRATE) 10 MG/0.5ML PO SOLN: 5.0000 mg | ORAL | 0 refills | Status: AC | PRN

## 2019-04-01 MED ORDER — MORPHINE SULFATE (CONCENTRATE) 10 MG/0.5ML PO SOLN
5.0000 mg | ORAL | Status: DC | PRN
  Administered 2019-04-01: 5 mg via SUBLINGUAL
  Filled 2019-04-01: qty 1

## 2019-04-01 MED ORDER — CIPROFLOXACIN IN D5W 400 MG/200ML IV SOLN
400.0000 mg | Freq: Two times a day (BID) | INTRAVENOUS | Status: DC
  Filled 2019-04-01 (×2): qty 200

## 2019-04-01 MED ORDER — MIRTAZAPINE 15 MG PO TABS
15.0000 mg | ORAL_TABLET | Freq: Every day | ORAL | Status: DC
  Administered 2019-04-01: 15 mg via ORAL
  Filled 2019-04-01: qty 1

## 2019-04-01 MED ORDER — POTASSIUM CHLORIDE CRYS ER 20 MEQ PO TBCR
40.0000 meq | EXTENDED_RELEASE_TABLET | Freq: Once | ORAL | Status: AC
  Administered 2019-04-01: 11:00:00 40 meq via ORAL
  Filled 2019-04-01: qty 2

## 2019-04-01 MED ORDER — MIRTAZAPINE 15 MG PO TABS: 7.5000 mg | ORAL_TABLET | Freq: Every day | ORAL | Status: DC

## 2019-04-01 MED ORDER — CIPROFLOXACIN HCL 500 MG PO TABS: 500.0000 mg | ORAL_TABLET | Freq: Two times a day (BID) | ORAL | 0 refills | Status: AC

## 2019-04-01 NOTE — Progress Notes (Addendum)
Palliative:  HPI: 83 y.o.malewith past medical history of chronic respiratory failure, COPD on home oxygen and trilogy, CHF EF 35-40%, CAD, MI, HTN, HLD, OSA, hip fracture December 2019, current hospice patientadmitted on 7/26/2020with unresponsiveness and hypoxia.Patient required intubation and ICU admissionfor acute hypoxic respiratory failure with underlying COPD and CHF.Extubated to HFNC 7/27.Urine culture positive for E. Coli receiving IV antibiotics. Followed by [facility]. Palliative medicine for goals of care.    I met today with Dylan [name] with his wife, 2 sons, and daughter-in-law at bedside along with Dr. Lanney Gins and Santiago Glad, RN with hospice. We had a discussed for GOC and to help develop a plan for where we go from here. They have good understanding of his severe chronic illness with COPD, CHF, and severe debility/weakness leading to infections and overall worsening health and functional status. We continued conversation regarding code status that was initiated yesterday and Dylan [name] confirms strongly that he does not wish to be re-intubated and connected to ventilator. I further explained that without intubation there is no role for CPR and that this would only cause him pain and suffering at the end of his life. Dylan [name] has a difficult time understanding this concept and his family very much wants to hear him say that he does not want CPR. Unfortunately he could not say this for them. However, he did admit that he would rather have comfort and peace at the end of his life vs ICU, machines, aggressive care. I explained to family that I would place DNR based on this conversation and they agree.   The other main goal is to take him back home per his wishes. They have caregivers arranged and hospice support at home. Family concerned that one of the caregivers panics when he is ill. Hospice can work with education for family and caregivers to focus on his symptoms and  QOL at home and a plan if he declines. We discussed that we will begin to work more aggressively on symptom management to give him relief from SOB and pain and this plan can continue to be adjusted by hospice at home.   All questions/concerns addressed. Emotional support provided. Therapeutic listening.   Exam: Alert, underlying poor understanding of decisions and situation. Tachypnea with mild-mod distress and use of accessory muscles. Generalized weakness. Bilat legs with 2+ edema but swelling appears improved.   Plan: - SOB/pain: Roxanol 10 mg every 4 hours scheduled and every 2 hours prn.  - Anxiety: Family says that Xanax has been very strong and everything has been oversedating. I would consider very low dose Valium (having tremors too) even 1 mg BID prn.  - Agitation: This is worse at night. Worse when family not present (family allowed to have 1 visitor and allowed to switch off to keep someone present with him as much as possible). No benefit from Seroquel so d/c. Continue trazodone. I have added mirtazapine 15 mg qhs.   [id number] 325 min  Vinie Sill, NP Palliative Medicine Team Pager [phone] (Please see [email or web address] for schedule) Team Phone [phone]    Greater than 50%  of this time was spent counseling and coordinating care related to the above assessment and plan

## 2019-04-01 NOTE — Progress Notes (Signed)
Patient was SOB, patient placed on bipap to rest.

## 2019-04-01 NOTE — Progress Notes (Signed)
Visit made with Palliative NP Vinie Sill and pulmonologist Dr. Lanney Gins for family meeting regarding goals of care. Patient's son's,wife and daughter in law were in attendance. Patient was alert and did participate in the conversation. Bipap was tried again this morning prior to visit and again patient did not tolerate it. Much discussion regarding CPR. Patient focused more on intubation which he stated clearly that he did not want. Consensus of family and patient is for full DNR status. Also discussed symptom management here in the ICU and at home. Liquid morphine will be started for symptom management, all in agreement. Emotional support given, will continue to follow and support patient and family. Hospice team updated. Patient will require a prescription for liquid morphine at discharge, signed DNR in place in hospital chart.  Flo Shanks BSN, RN, [facility]  [phone]

## 2019-04-01 NOTE — Progress Notes (Signed)
Pharmacy Electrolyte Monitoring Consult:  Pharmacy consulted to assist in monitoring and replacing electrolytes in this 83 y.o. male admitted on 03/28/2019.  Labs:  Sodium (mmol/L)  Date Value  04/01/2019 143  12/02/2012 142   Potassium (mmol/L)  Date Value  04/01/2019 3.6  12/02/2012 3.1 (L)   Magnesium (mg/dL)  Date Value  03/31/2019 2.3   Phosphorus (mg/dL)  Date Value  03/30/2019 4.0   Calcium (mg/dL)  Date Value  04/01/2019 8.7 (L)   Calcium, Total (mg/dL)  Date Value  12/02/2012 8.2 (L)   Albumin (g/dL)  Date Value  03/29/2019 2.4 (L)  12/02/2012 3.3 (L)    Assessment/Plan: Furosemide 40mg  IV BID.   Will order potassium 7mEq PO x 1.  Will replace for goal potassium ~ 4, magnesium ~ 2.   Electrolytes with am labs.   Pharmacy will continue to monitor and adjust per consult.   Simpson,Michael L 04/01/2019 4:31 PM

## 2019-04-01 NOTE — Progress Notes (Addendum)
[employer] - [place] at [facility]                                                                                                                                                                                  Patient Demographics   Dylan [name], is a 83 y.o. male, DOB - [birth year], WGN:[id number]  Admit date - 03/28/2019   Admitting Physician Arnaldo Natal, MD  Outpatient Primary MD for the patient is [name], MD   LOS - 4  Subjective: Patient currently on oxygen via nasal cannula Off ventilator Awake and responds to all verbal commands Oral intake better Patient and family had meeting with palliative care team and intensivist Patient made DNR by CODE STATUS Has hospice services at home Has been nonambulatory for last 18 months   Review of Systems:  Review of Systems  Constitutional: Has generalized weakness HENT: Negative.   Eyes: Negative.   Respiratory: Mild shortness of breath, occasional cough Cardiovascular: Negative.   Gastrointestinal: Negative.   Genitourinary: Negative.   Musculoskeletal: Swelling in the lower extremity Skin: Negative.   Neurological: Negative.   Endo/Heme/Allergies: Negative.   Psychiatric/Behavioral: Negative.   All other systems reviewed and are negative.  Vitals:   Vitals:   04/01/19 0800 04/01/19 0900 04/01/19 1000 04/01/19 1100  BP: (!) 170/98   (!) 177/95  Pulse: 86 88 82 85  Resp: 20 (!) 28 (!) 35 (!) 31  Temp: 98.9 F (37.2 C)     TempSrc: Oral     SpO2: 100% 99% 99% 99%  Weight:      Height:        Wt Readings from Last 3 Encounters:  04/01/19 124.7 kg  02/21/19 130 kg  02/03/19 120.7 kg     Intake/Output Summary (Last 24 hours) at 04/01/2019 1153 Last data filed at 04/01/2019 1059 Gross per 24 hour  Intake 967.5 ml  Output 1720 ml  Net -752.5 ml    Physical Exam:   GENERAL: Critically ill on high flow oxygen HEAD, EYES, EARS, NOSE AND THROAT: Atraumatic, normocephalic. Extraocular  muscles are intact. Pupils equal and reactive to light. Sclerae anicteric. No conjunctival injection. No oro-pharyngeal erythema.  NECK: Supple. There is no jugular venous distention. No bruits, no lymphadenopathy, no thyromegaly.  HEART: Regular rate and rhythm,. No murmurs, no rubs, no clicks.  LUNGS: Decreased breath sounds bilateral.  ABDOMEN: Soft, flat, nontender, nondistended. Has good bowel sounds. No hepatosplenomegaly appreciated.  EXTREMITIES: Positive edema  nEUROLOGIC: Confused  sKIN: Moist and warm with no rashes appreciated.  Psych: Confused LN: No inguinal LN enlargement    Antibiotics   Anti-infectives (From admission, onward)   Start  Dose/Rate Route Frequency Ordered Stop   04/01/19 0000  ciprofloxacin (CIPRO) 500 MG tablet     500 mg Oral 2 times daily 04/01/19 1030 04/06/19 2359   03/31/19 1645  meropenem (MERREM) 1 g in sodium chloride 0.9 % 100 mL IVPB     1 g 200 mL/hr over 30 Minutes Intravenous Every 8 hours 03/31/19 1631 04/05/19 1359   03/29/19 2200  vancomycin (VANCOCIN) 1,500 mg in sodium chloride 0.9 % 500 mL IVPB  Status:  Discontinued     1,500 mg 250 mL/hr over 120 Minutes Intravenous Every 24 hours 03/28/19 2225 03/29/19 1158   03/29/19 1000  cefTRIAXone (ROCEPHIN) 1 g in sodium chloride 0.9 % 100 mL IVPB  Status:  Discontinued     1 g 200 mL/hr over 30 Minutes Intravenous Every 24 hours 03/29/19 0917 03/31/19 1631   03/28/19 2200  vancomycin (VANCOCIN) 2,500 mg in sodium chloride 0.9 % 500 mL IVPB     2,500 mg 250 mL/hr over 120 Minutes Intravenous  Once 03/28/19 2138 03/29/19 0001   03/28/19 0200  cefTRIAXone (ROCEPHIN) 2 g in sodium chloride 0.9 % 100 mL IVPB  Status:  Discontinued     2 g 200 mL/hr over 30 Minutes Intravenous Every 24 hours 03/28/19 0159 03/28/19 2211   03/28/19 0200  azithromycin (ZITHROMAX) 500 mg in sodium chloride 0.9 % 250 mL IVPB  Status:  Discontinued     500 mg 250 mL/hr over 60 Minutes Intravenous Every 24 hours  03/28/19 0159 03/28/19 2211      Medications   Scheduled Meds: . Chlorhexidine Gluconate Cloth  6 each Topical Q0600  . enoxaparin (LOVENOX) injection  40 mg Subcutaneous Daily  . feeding supplement (ENSURE ENLIVE)  237 mL Oral TID BM  . furosemide  40 mg Intravenous BID  . ipratropium-albuterol  3 mL Nebulization TID  . midodrine  10 mg Oral TID  . multivitamin-lutein  1 capsule Oral Daily  . mupirocin ointment   Nasal BID  . pantoprazole  40 mg Oral Daily   Continuous Infusions: . sodium chloride 5 mL/hr at 04/01/19 1059  . meropenem (MERREM) IV Stopped (04/01/19 [id number])   PRN Meds:.sodium chloride, acetaminophen **OR** acetaminophen, albuterol, hydrALAZINE, morphine CONCENTRATE, ondansetron **OR** ondansetron (ZOFRAN) IV, QUEtiapine, traMADol, traZODone   Data Review:   Micro Results Recent Results (from the past 240 hour(s))  Blood Culture (routine x 2)     Status: Abnormal   Collection Time: 03/28/19  2:19 AM   Specimen: BLOOD  Result Value Ref Range Status   Specimen Description   Final    BLOOD RIGHT ANTECUBITAL Performed at [facility], [address] [place], [place] [address]    Special Requests   Final    BOTTLES DRAWN AEROBIC AND ANAEROBIC Blood Culture adequate volume Performed at [facility], [address] [place], [place] [address]    Culture  Setup Time   Final    IN BOTH AEROBIC AND ANAEROBIC BOTTLES GRAM POSITIVE COCCI CRITICAL RESULT CALLED TO, READ BACK BY AND VERIFIED WITH: ASAJAH DUNCAN ON 03/28/19 AT [address] [facility]    Culture (A)  Final    STAPHYLOCOCCUS SPECIES (COAGULASE NEGATIVE) THE SIGNIFICANCE OF ISOLATING THIS ORGANISM FROM A SINGLE SET OF BLOOD CULTURES WHEN MULTIPLE SETS ARE DRAWN IS UNCERTAIN. PLEASE NOTIFY THE MICROBIOLOGY DEPARTMENT WITHIN ONE WEEK IF SPECIATION AND SENSITIVITIES ARE REQUIRED. Performed at [facility], [address] [place], [place] [address]    Report Status 03/30/2019 FINAL  Final  Blood  Culture (routine x 2)     Status: None (Preliminary result)   Collection Time: 03/28/19  2:19 AM   Specimen: BLOOD  Result Value Ref Range Status   Specimen Description BLOOD RIGHT ARM  Final   Special Requests   Final    BOTTLES DRAWN AEROBIC AND ANAEROBIC Blood Culture results may not be optimal due to an excessive volume of blood received in culture bottles   Culture   Final    NO GROWTH 4 DAYS Performed at [facility], [address] [place], [place] [address]    Report Status PENDING  Incomplete  Urine culture     Status: Abnormal   Collection Time: 03/28/19  2:19 AM   Specimen: Urine, Random  Result Value Ref Range Status   Specimen Description   Final    URINE, RANDOM Performed at [facility], [address] [place], [place] [address]    Special Requests   Final    NONE Performed at [facility], [address] [place], [place] [address]    Culture >=100,000 COLONIES/mL ESCHERICHIA COLI (A)  Final   Report Status 03/31/2019 FINAL  Final   Organism ID, Bacteria ESCHERICHIA COLI (A)  Final      Susceptibility   Escherichia coli - MIC*    AMPICILLIN >=32 RESISTANT Resistant     CEFAZOLIN >=64 RESISTANT Resistant     CEFTRIAXONE >=64 RESISTANT Resistant     CIPROFLOXACIN 1 SENSITIVE Sensitive     GENTAMICIN <=1 SENSITIVE Sensitive     IMIPENEM RESISTANT Resistant     NITROFURANTOIN <=16 SENSITIVE Sensitive     TRIMETH/SULFA >=320 RESISTANT Resistant     AMPICILLIN/SULBACTAM >=32 RESISTANT Resistant     PIP/TAZO >=128 RESISTANT Resistant     Extended ESBL NEGATIVE Sensitive     * >=100,000 COLONIES/mL ESCHERICHIA COLI  SARS Coronavirus 2 (CEPHEID- Performed in [facility] lab), Hosp Order     Status: None   Collection Time: 03/28/19  2:19 AM   Specimen: Urine, Unspecified Source; Nasopharyngeal  Result Value Ref Range Status   SARS Coronavirus 2 NEGATIVE NEGATIVE Final    Comment: (NOTE) If result is NEGATIVE SARS-CoV-2  target nucleic acids are NOT DETECTED. The SARS-CoV-2 RNA is generally detectable in upper and lower  respiratory specimens during the acute phase of infection. The lowest  concentration of SARS-CoV-2 viral copies this assay can detect is 250  copies / mL. A negative result does not preclude SARS-CoV-2 infection  and should not be used as the sole basis for treatment or other  patient management decisions.  A negative result may occur with  improper specimen collection / handling, submission of specimen other  than nasopharyngeal swab, presence of viral mutation(s) within the  areas targeted by this assay, and inadequate number of viral copies  (<250 copies / mL). A negative result must be combined with clinical  observations, patient history, and epidemiological information. If result is POSITIVE SARS-CoV-2 target nucleic acids are DETECTED. The SARS-CoV-2 RNA is generally detectable in upper and lower  respiratory specimens dur ing the acute phase of infection.  Positive  results are indicative of active infection with SARS-CoV-2.  Clinical  correlation with patient history and other diagnostic information is  necessary to determine patient infection status.  Positive results do  not rule out bacterial infection or co-infection with other viruses. If result is PRESUMPTIVE POSTIVE SARS-CoV-2 nucleic acids MAY BE PRESENT.   A presumptive positive result  was obtained on the submitted specimen  and confirmed on repeat testing.  While 2019 novel coronavirus  (SARS-CoV-2) nucleic acids may be present in the submitted sample  additional confirmatory testing may be necessary for epidemiological  and / or clinical management purposes  to differentiate between  SARS-CoV-2 and other Sarbecovirus currently known to infect humans.  If clinically indicated additional testing with an alternate test  methodology [phone]) is advised. The SARS-CoV-2 RNA is generally  detectable in upper and lower  respiratory sp ecimens during the acute  phase of infection. The expected result is Negative. Fact Sheet for Patients:  StrictlyIdeas.no Fact Sheet for Healthcare Providers: BankingDealers.co.za This test is not yet approved or cleared by the [place] FDA and has been authorized for detection and/or diagnosis of SARS-CoV-2 by FDA under an Emergency Use Authorization (EUA).  This EUA will remain in effect (meaning this test can be used) for the duration of the COVID-19 declaration under Section 564(b)(1) of the Act, 21 U.S.C. section 360bbb-3(b)(1), unless the authorization is terminated or revoked sooner. Performed at [facility], [address] [place], [place] [address]   Blood Culture ID Panel (Reflexed)     Status: Abnormal   Collection Time: 03/28/19  2:19 AM  Result Value Ref Range Status   Enterococcus species NOT DETECTED NOT DETECTED Final   Listeria monocytogenes NOT DETECTED NOT DETECTED Final   Staphylococcus species DETECTED (A) NOT DETECTED Final    Comment: Methicillin (oxacillin) resistant coagulase negative staphylococcus. Possible blood culture contaminant (unless isolated from more than one blood culture draw or clinical case suggests pathogenicity). No antibiotic treatment is indicated for blood  culture contaminants. CRITICAL RESULT CALLED TO, READ BACK BY AND VERIFIED WITH: ASAJAH DUNCAN ON 03/28/19 AT [address] [place]    Staphylococcus aureus (BCID) NOT DETECTED NOT DETECTED Final   Methicillin resistance DETECTED (A) NOT DETECTED Final    Comment: CRITICAL RESULT CALLED TO, READ BACK BY AND VERIFIED WITH: ASAJAH DUNCAN ON 03/28/19 AT [address] [place]    Streptococcus species NOT DETECTED NOT DETECTED Final   Streptococcus agalactiae NOT DETECTED NOT DETECTED Final   Streptococcus pneumoniae NOT DETECTED NOT DETECTED Final   Streptococcus pyogenes NOT DETECTED NOT DETECTED Final   Acinetobacter baumannii NOT  DETECTED NOT DETECTED Final   Enterobacteriaceae species NOT DETECTED NOT DETECTED Final   Enterobacter cloacae complex NOT DETECTED NOT DETECTED Final   Escherichia coli NOT DETECTED NOT DETECTED Final   Klebsiella oxytoca NOT DETECTED NOT DETECTED Final   Klebsiella pneumoniae NOT DETECTED NOT DETECTED Final   Proteus species NOT DETECTED NOT DETECTED Final   Serratia marcescens NOT DETECTED NOT DETECTED Final   Haemophilus influenzae NOT DETECTED NOT DETECTED Final   Neisseria meningitidis NOT DETECTED NOT DETECTED Final   Pseudomonas aeruginosa NOT DETECTED NOT DETECTED Final   Candida albicans NOT DETECTED NOT DETECTED Final   Candida glabrata NOT DETECTED NOT DETECTED Final   Candida krusei NOT DETECTED NOT DETECTED Final   Candida parapsilosis NOT DETECTED NOT DETECTED Final   Candida tropicalis NOT DETECTED NOT DETECTED Final    Comment: Performed at [facility], [address] [place], [place] [address]  MRSA PCR Screening     Status: Abnormal   Collection Time: 03/28/19  5:47 AM   Specimen: Nasal Mucosa; Nasopharyngeal  Result Value Ref Range Status   MRSA by PCR POSITIVE (A) NEGATIVE Final    Comment:        The GeneXpert MRSA Assay (FDA approved for NASAL  specimens only), is one component of a comprehensive MRSA colonization surveillance program. It is not intended to diagnose MRSA infection nor to guide or monitor treatment for MRSA infections. RESULT CALLED TO, READ BACK BY AND VERIFIED WITH: KAYLA FOSTER @0731  ON 03/28/2019 BY FMW Performed at [facility], [address], [place], [place] [address]     Radiology Reports Dg Chest 1 View  Result Date: 03/29/2019 CLINICAL DATA:  83 year old male with respiratory failure. EXAM: CHEST  1 VIEW COMPARISON:  03/28/2019 and earlier. FINDINGS: Portable AP semi upright view at 1558 hours. Extubated and enteric tube removed. Progressive, complete opacification of the left hemithorax since the CT on  02/21/2019 which demonstrated large left and moderate right layering pleural effusions with atelectasis. No superimposed pneumothorax. Right lung pulmonary vascularity appears stable since June. Mediastinal contours are obscured. No acute osseous abnormality identified. IMPRESSION: 1. Extubated and enteric tube removed. 2. Progressive, complete opacification of the left hemithorax since 02/21/2019 suspected due to worsening combined pleural effusions and atelectasis. Recommend confirmation of fluid by ultrasound prior to any attempted thoracentesis. Electronically Signed   By: Odessa Fleming M.D.   On: 03/29/2019 16:58   Dg Chest Port 1 View  Result Date: 03/28/2019 CLINICAL DATA:  Intubation EXAM: PORTABLE CHEST 1 VIEW COMPARISON:  02/23/2019 FINDINGS: The endotracheal tube tip just below the level of clavicular heads. Orogastric tube side port projects over the gastric body. Patient is markedly rotated to the left, obscuring most of the left hemithorax. Visualized right lung is clear. IMPRESSION: Radiographically appropriate position of endotracheal 2. Electronically Signed   By: Deatra Robinson M.D.   On: 03/28/2019 02:34     CBC Recent Labs  Lab 03/28/19 0219 03/29/19 0519 03/30/19 0446  WBC 10.9* 9.4 11.2*  HGB 10.9* 10.4* 11.2*  HCT 36.9* 33.8* 36.0*  PLT 188 166 173  MCV 103.7* 99.7 99.4  MCH 30.6 30.7 30.9  MCHC 29.5* 30.8 31.1  RDW 17.2* 17.6* 17.7*  LYMPHSABS 0.6* 1.6 1.4  MONOABS 0.5 0.6 0.8  EOSABS 0.2 0.1 0.2  BASOSABS 0.1 0.0 0.0    Chemistries  Recent Labs  Lab 03/28/19 0219 03/29/19 0519 03/30/19 0446 03/31/19 0411 04/01/19 0427  NA 143 146* 146* 145 143  K 4.8 3.6 3.7 3.4* 3.6  CL 101 105 103 106 104  CO2 31 31 29 29 28   GLUCOSE 188* 99 88 121* 175*  BUN 34* 34* 32* 33* 33*  CREATININE 1.14 1.14 1.13 1.00 0.92  CALCIUM 8.4* 8.4* 8.7* 8.4* 8.7*  MG  --  2.5* 2.4 2.3  --   AST 49* 37  --   --   --   ALT 28 23  --   --   --   ALKPHOS 148* 107  --   --   --    BILITOT 0.8 0.9  --   --   --    ------------------------------------------------------------------------------------------------------------------ estimated creatinine clearance is 84.7 mL/min (by C-G formula based on SCr of 0.92 mg/dL). ------------------------------------------------------------------------------------------------------------------ No results for input(s): HGBA1C in the last 72 hours. ------------------------------------------------------------------------------------------------------------------ No results for input(s): CHOL, HDL, LDLCALC, TRIG, CHOLHDL, LDLDIRECT in the last 72 hours. ------------------------------------------------------------------------------------------------------------------ No results for input(s): TSH, T4TOTAL, T3FREE, THYROIDAB in the last 72 hours.  Invalid input(s): FREET3 ------------------------------------------------------------------------------------------------------------------ No results for input(s): VITAMINB12, FOLATE, FERRITIN, TIBC, IRON, RETICCTPCT in the last 72 hours.  Coagulation profile Recent Labs  Lab 03/28/19 0219  INR 1.1    No results for input(s): DDIMER in the last 72 hours.  Cardiac Enzymes No results for input(s): CKMB, TROPONINI, MYOGLOBIN in the last 168 hours.  Invalid input(s): CK ------------------------------------------------------------------------------------------------------------------ Invalid input(s): POCBNP    Assessment & Plan   This is an 83 year old male admitted for respiratory failure. 1.  S/P Respiratory failure: Acute on chronic; with hypoxia.  Due to multifactorial, acute CHF and acute COPD exasperation continue IV Lasix and nebulizer therapy. Oxygen via nasal canula. 2.  COPD: End-stage; scheduled albuterol treatments.  3.  CAD: Troponin is elevated likely due to demand ischemia , s/p cardiology f/u 4.  Hypertension: Hold antihypertensive agents at this time.  5.  DVT  prophylaxis: Lovenox 6.  GI prophylaxis: PPI after 24 hours intubation 7.  E. coli urinary tract infection Continue IV meropenem antibiotic 8.  Bacteremia Coagulase-negative staph Contaminant 9.  Probable discharge in a.m. with home hospice services 10.  Liquid morphine for pain management 11.  Supportive care  Code status : DNR    Code Status Orders  (From admission, onward)         Start     Ordered   03/28/19 0517  Full code  Continuous     03/28/19 0516        Code Status History    Date Active Date Inactive Code Status Order ID Comments User Context   02/21/2019 1125 02/24/2019 2310 Full Code [id number]  Alford HighlandWieting, Richard, MD ED   01/30/2019 1805 02/03/2019 1754 Full Code [id number]  Jama Flavorsjie, Jude, MD ED   11/23/2018 0619 12/01/2018 1541 Full Code [id number]  Arnaldo Nataliamond, Michael S, MD Inpatient   11/14/2018 0314 11/19/2018 1822 Full Code [id number]  Mansy, Vernetta HoneyJan A, MD ED   10/27/2018 2101 10/31/2018 2110 Partial Code [id number]  Altamese DillingVachhani, Vaibhavkumar, MD Inpatient   08/26/2018 1137 09/04/2018 1912 Full Code [id number]  Kennedy BuckerMenz, Michael, MD ED   06/21/2018 1321 06/24/2018 2008 Partial Code [id number]  Milagros LollSudini, Srikar, MD Inpatient   06/21/2018 0803 06/21/2018 1321 Full Code [id number]  Iran OuchArida, Muhammad A, MD Inpatient   06/01/2018 2009 06/04/2018 1718 Full Code [id number]  Donato HeinzHooten, James P, MD Inpatient   01/11/2018 0012 01/14/2018 1908 Full Code [id number]  Cammy CopaMaier, Angela, MD Inpatient   12/01/2017 1924 12/05/2017 1948 Full Code [id number]  Altamese DillingVachhani, Vaibhavkumar, MD Inpatient   08/31/2017 0557 09/05/2017 1959 Full Code [id number]  Arnaldo Nataliamond, Michael S, MD Inpatient   06/10/2016 1934 06/13/2016 1816 Full Code [id number]  Shaune Pollackhen, Qing, MD Inpatient   12/15/2015 0134 12/16/2015 1507 Full Code [id number]  Ihor Austin, , MD ED   Advance Care Planning Activity           Consults pulmonary critical care  DVT Prophylaxis Lovenox Lab Results  Component Value Date   PLT 173 03/30/2019     Time Spent in minutes  35min  Greater than 50% of time spent in care coordination and counseling patient regarding the condition and plan of care.   Ihor Austin  M.D on 04/01/2019 at 11:53 AM  Between 7am to 6pm - Pager - [phone]  After 6pm go to [email or web address] - Social research officer, [email or web address] [email or web address]  [employer]   Office  [phone]

## 2019-04-01 NOTE — Progress Notes (Signed)
CRITICAL CARE NOTE      CHIEF COMPLAINT:   Acute hypoxemic respiratory failure   SUBJECTIVE FINDINGS & SIGNIFICANT EVENTS    83 year old male with acute exacerbation of CHF likely due to UTI, was intubated due to severe hypoxemia.  -patient refuses to wear BIPAP  - had a family conference at bedside today with 4 additional family members as well as hospice RN and palliatve care NP to discuss goals of care.  Code status made by patient and family to DNR/DNI with hospice at home to be continued   -Urine culture positive for E. coli and blood culture positive for methicillin-resistant staph epidermidis.   Patient is currently on Rocephin   Continues to have labored breathing not responding to tramadol , have given small dose of morphine patient is more comfortable.     PAST MEDICAL HISTORY   Past Medical History:  Diagnosis Date  . CAD (coronary artery disease)   . Cervicalgia   . CHF (congestive heart failure) (HCC)   . COPD (chronic obstructive pulmonary disease) (HCC)   . Diastolic heart failure (HCC)   . Foot drop, right   . History of kidney stones   . Hyperlipidemia    unspecified  . Hypertension   . Myocardial infarction (HCC)   . Osteoarthritis   . Shoulder pain, left   . Sleep apnea   . Tremor, essential      SURGICAL HISTORY   Past Surgical History:  Procedure Laterality Date  . APPLICATION OF WOUND VAC Right 11/13/2017   Procedure: APPLICATION OF WOUND VAC;  Surgeon: Annice Needyew, Jason S, MD;  Location: [facility];  Service: Vascular;  Laterality: Right;  . BACK SURGERY  06/2010  . CARDIAC CATHETERIZATION Left 04/30/2016   Procedure: Left Heart Cath and Coronary Angiography;  Surgeon: Laurier NancyShaukat A Khan, MD;  Location: [facility];  Service: Cardiovascular;  Laterality: Left;  .  COLONOSCOPY    . CORONARY ANGIOPLASTY    . CORONARY/GRAFT ACUTE MI REVASCULARIZATION N/A 06/21/2018   Procedure: Coronary/Graft Acute MI Revascularization;  Surgeon: Iran OuchArida, Muhammad A, MD;  Location: [facility];  Service: Cardiovascular;  Laterality: N/A;  . KNEE ARTHROPLASTY Left 06/01/2018   Procedure: COMPUTER ASSISTED TOTAL KNEE ARTHROPLASTY;  Surgeon: Donato HeinzHooten, James P, MD;  Location: [facility];  Service: Orthopedics;  Laterality: Left;  . KNEE ARTHROSCOPY Left   . KNEE SURGERY Left 1998  . LEFT HEART CATH AND CORONARY ANGIOGRAPHY N/A 06/21/2018   Procedure: LEFT HEART CATH AND CORONARY ANGIOGRAPHY;  Surgeon: Iran OuchArida, Muhammad A, MD;  Location: [facility];  Service: Cardiovascular;  Laterality: N/A;  . ORIF FEMUR FRACTURE Left 08/27/2018   Procedure: OPEN REDUCTION INTERNAL FIXATION (ORIF) SUPRACONDYLAR FEMUR FRACTURE ABOVE PROSTHESIS, QUADRICEPS REPAIR;  Surgeon: Kennedy BuckerMenz, Michael, MD;  Location: [facility];  Service: Orthopedics;  Laterality: Left;  . TEE WITHOUT CARDIOVERSION N/A 11/18/2018   Procedure: TRANSESOPHAGEAL ECHOCARDIOGRAM (TEE);  Surgeon: Lamar BlinksKowalski, Bruce J, MD;  Location: [facility];  Service: Cardiovascular;  Laterality: N/A;  . TONSILLECTOMY    . WOUND DEBRIDEMENT Right 11/13/2017   Procedure: DEBRIDEMENT WOUND;  Surgeon: Annice Needyew, Jason S, MD;  Location: [facility];  Service: Vascular;  Laterality: Right;     FAMILY HISTORY   Family History  Problem Relation Age of Onset  . Diabetes Son   . Cancer Mother   . Colon cancer Mother   . Lung cancer Mother   . Tremor Father   . Heart disease Father   . Tremor Brother   .  Bladder Cancer Brother   . Tremor Sister      SOCIAL HISTORY   Social History   Tobacco Use  . Smoking status: Former Smoker    Packs/day: 0.25    Years: 58.00    Pack years: 14.50    Types: Cigarettes  . Smokeless tobacco: Former NeurosurgeonUser  . Tobacco comment: quit the begginning of septembet 2019  Substance Use Topics  . Alcohol use: No     Alcohol/week: 0.0 standard drinks  . Drug use: No     MEDICATIONS   Current Medication:  Current Facility-Administered Medications:  .  0.9 %  sodium chloride infusion, , Intravenous, PRN, Arnaldo Nataliamond, Michael S, MD, Stopped at 03/31/19 1733 .  acetaminophen (TYLENOL) tablet 650 mg, 650 mg, Oral, Q6H PRN **OR** acetaminophen (TYLENOL) suppository 650 mg, 650 mg, Rectal, Q6H PRN, Arnaldo Nataliamond, Michael S, MD .  albuterol (PROVENTIL) (2.5 MG/3ML) 0.083% nebulizer solution 2.5 mg, 2.5 mg, Nebulization, Q2H PRN, Vida RiggerAleskerov, Mykeria Garman, MD .  Chlorhexidine Gluconate Cloth 2 % PADS 6 each, 6 each, Topical, Q0600, Eugenie NorrieBlakeney, Dana G, NP, 6 each at 04/01/19 [phone] .  enoxaparin (LOVENOX) injection 40 mg, 40 mg, Subcutaneous, Daily, Bertram SavinSimpson, Michael L, RPH, 40 mg at 03/31/19 1005 .  feeding supplement (ENSURE ENLIVE) (ENSURE ENLIVE) liquid 237 mL, 237 mL, Oral, TID BM, Kynsleigh Westendorf, MD, 237 mL at 03/31/19 2138 .  furosemide (LASIX) injection 40 mg, 40 mg, Intravenous, BID, Karna ChristmasAleskerov, Victoire Deans, MD, 40 mg at 03/31/19 1701 .  hydrALAZINE (APRESOLINE) injection 10 mg, 10 mg, Intravenous, Q6H PRN, Harlon DittyKeene, Jeremiah D, NP, 10 mg at 04/01/19 [id number] .  ipratropium-albuterol (DUONEB) 0.5-2.5 (3) MG/3ML nebulizer solution 3 mL, 3 mL, Nebulization, TID, Karna ChristmasAleskerov, Inita Uram, MD, 3 mL at 04/01/19 0743 .  meropenem (MERREM) 1 g in sodium chloride 0.9 % 100 mL IVPB, 1 g, Intravenous, Q8H, Sharlisa Hollifield, MD, Last Rate: 200 mL/hr at 04/01/19 0538, 1 g at 04/01/19 0538 .  midodrine (PROAMATINE) tablet 10 mg, 10 mg, Oral, TID, Vida RiggerAleskerov, Matthew Cina, MD, Stopped at 03/31/19 1647 .  morphine 2 MG/ML injection 0.5 mg, 0.5 mg, Intravenous, Q3H PRN, Vida RiggerAleskerov, Landin Tallon, MD, 0.5 mg at 04/01/19 0128 .  multivitamin-lutein (OCUVITE-LUTEIN) capsule 1 capsule, 1 capsule, Oral, Daily, Vida RiggerAleskerov, Cyani Kallstrom, MD, 1 capsule at 03/31/19 1002 .  mupirocin ointment (BACTROBAN) 2 %, , Nasal, BID, Tukov-Yual, Magdalene S, NP .  ondansetron (ZOFRAN) tablet 4 mg, 4 mg, Oral, Q6H PRN  **OR** ondansetron (ZOFRAN) injection 4 mg, 4 mg, Intravenous, Q6H PRN, Arnaldo Nataliamond, Michael S, MD .  pantoprazole (PROTONIX) EC tablet 40 mg, 40 mg, Oral, Daily, Bertram SavinSimpson, Michael L, RPH, 40 mg at 03/31/19 1002 .  QUEtiapine (SEROQUEL) tablet 50 mg, 50 mg, Oral, QHS PRN, Eugenie NorrieBlakeney, Dana G, NP, 50 mg at 04/01/19 0018 .  traMADol (ULTRAM) tablet 50 mg, 50 mg, Oral, Q8H PRN, Alita ChyleMason, Megan M, NP, 50 mg at 04/01/19 0315 .  traZODone (DESYREL) tablet 50 mg, 50 mg, Oral, QHS PRN, Eugenie NorrieBlakeney, Dana G, NP, 50 mg at 03/31/19 2137    ALLERGIES   Patient has no known allergies.    REVIEW OF SYSTEMS     Unable to obtain due to mild confusion post sedation.  PHYSICAL EXAMINATION   Vitals:   04/01/19 0700 04/01/19 0747  BP: (!) 178/153   Pulse: 91 90  Resp: (!) 28 (!) 30  Temp:    SpO2: 100% 100%    GENERAL: No apparent distress HEAD: Normocephalic, atraumatic.  EYES: Pupils equal, round, reactive to light.  No scleral  icterus.  MOUTH: Moist mucosal membrane. NECK: Supple. No thyromegaly. No nodules. No JVD.  PULMONARY: Decreased breath sounds bilaterally CARDIOVASCULAR: S1 and S2. Regular rate and rhythm. No murmurs, rubs, or gallops.  GASTROINTESTINAL: Soft, nontender, non-distended. No masses. Positive bowel sounds. No hepatosplenomegaly.  MUSCULOSKELETAL: No swelling, clubbing, or edema.  NEUROLOGIC: Mild distress due to acute illness SKIN:intact,warm,dry   LABS AND IMAGING       LAB RESULTS: Recent Labs  Lab 03/30/19 0446 03/31/19 0411 04/01/19 0427  NA 146* 145 143  K 3.7 3.4* 3.6  CL 103 106 104  CO2 29 29 28   BUN 32* 33* 33*  CREATININE 1.13 1.00 0.92  GLUCOSE 88 121* 175*   Recent Labs  Lab 03/28/19 0219 03/29/19 0519 03/30/19 0446  HGB 10.9* 10.4* 11.2*  HCT 36.9* 33.8* 36.0*  WBC 10.9* 9.4 11.2*  PLT 188 166 173     IMAGING RESULTS: No results found.    ASSESSMENT AND PLAN    -Multidisciplinary rounds held today  Acute Hypoxic Respiratory Failure  -Due to decompensated heart failure with preserved ejection fraction as evidenced by severe hypoxemia with lower extremity edema, increased JVD, with elevated BNP. -Patient is status post diuresis and successful liberation from mechanical ventilation -weaning HFNC today  -goals of care meeting with family today and another meeting with hospice as well as discussion with palliative care today   COPD  -Continue COPD care path  -Incentive spirometer at bedside  -nightly BiPAP-patient uses trilogy device at home   ID  -Urinary tract infection due to E. Coli-continue Rocephin  -Methicillin-resistant staph epidermidis-likely contaminant -vancomycin stopped -continue IV abx as prescibed -follow up cultures   GI/Nutrition GI PROPHYLAXIS as indicated DIET-->TF's as tolerated Constipation protocol as indicated   ENDO - ICU hypoglycemic\Hyperglycemia protocol -check FSBS per protocol   ELECTROLYTES -follow labs as needed -replace as needed -pharmacy consultation   DVT/GI PRX ordered -SCDs  TRANSFUSIONS AS NEEDED MONITOR FSBS ASSESS the need for LABS as needed   Critical care provider statement:    Critical care time (minutes):  33   Critical care time was exclusive of:  Separately billable procedures and treating other patients   Critical care was necessary to treat or prevent imminent or life-threatening deterioration of the following conditions:   Acute hypoxemic respiratory failure, acute decompensated diastolic CHF, COPD, morbid obesity, multiple comorbid conditions.   Critical care was time spent personally by me on the following activities:  Development of treatment plan with patient or surrogate, discussions with consultants, evaluation of patient's response to treatment, examination of patient, obtaining history from patient or surrogate, ordering and performing treatments and interventions, ordering and review of laboratory studies and re-evaluation of patient's  condition.  I assumed direction of critical care for this patient from another provider in my specialty: no      This document was prepared using Dragon voice recognition software and may include unintentional dictation errors.    Ottie Glazier, M.D.  Division of [place]

## 2019-04-01 NOTE — Progress Notes (Signed)
Pt transferred to RM #[id number] at this time. Family aware. VSS prior to transfer.

## 2019-04-02 DIAGNOSIS — J449 Chronic obstructive pulmonary disease, unspecified: Secondary | ICD-10-CM

## 2019-04-02 LAB — BASIC METABOLIC PANEL
Anion gap: 8 (ref 5–15)
BUN: 33 mg/dL — ABNORMAL HIGH (ref 8–23)
CO2: 29 mmol/L (ref 22–32)
Calcium: 8.4 mg/dL — ABNORMAL LOW (ref 8.9–10.3)
Chloride: 104 mmol/L (ref 98–111)
Creatinine, Ser: 0.93 mg/dL (ref 0.61–1.24)
GFR calc Af Amer: >60 mL/min (ref >60)
GFR calc non Af Amer: >60 mL/min (ref >60)
Glucose, Bld: 124 mg/dL — ABNORMAL HIGH (ref 70–99)
Potassium: 3.6 mmol/L (ref 3.5–5.1)
Sodium: 141 mmol/L (ref 135–145)

## 2019-04-02 LAB — GLUCOSE, CAPILLARY
Glucose-Capillary: 120 mg/dL — ABNORMAL HIGH (ref 70–99)
Glucose-Capillary: 131 mg/dL — ABNORMAL HIGH (ref 70–99)
Glucose-Capillary: 106 mg/dL — ABNORMAL HIGH (ref 70–99)

## 2019-04-02 LAB — CULTURE, BLOOD (ROUTINE X 2): Culture: NO GROWTH

## 2019-04-02 LAB — MAGNESIUM: Magnesium: 2.2 mg/dL (ref 1.7–2.4)

## 2019-04-02 NOTE — TOC Transition Note (Signed)
Transition of Care [facility]) - CM/SW Discharge Note   Patient Details  Name: Dylan [name] MRN: [id number] Date of Birth: 17-Apr-1936  Transition of Care [facility]) CM/SW Contact:  Shelbie Hutching, RN Phone Number: 04/02/2019, 11:00 AM   Clinical Narrative:     Patient will discharge home with hospice care.  Patient will transport via EMS.   Final next level of care: Home w Hospice Care Barriers to Discharge: Barriers Resolved   Patient Goals and CMS Choice Patient states their goals for this hospitalization and ongoing recovery are:: Home with [facility]      Discharge Placement                       Discharge Plan and Services                                     Social Determinants of Health (SDOH) Interventions     Readmission Risk Interventions Readmission Risk Prevention Plan 04/01/2019 02/02/2019 01/31/2019  Transportation Screening - Complete -  Medication Review Press photographer) - Complete Complete  PCP or Specialist appointment within 3-5 days of discharge - Complete -  PCP/Specialist Appt Not Complete comments - - -  [place] or Home Care Consult Complete Complete -  SW Recovery Care/Counseling Consult - Complete -  Palliative Care Screening Complete Complete -  [place] Not Applicable Not Applicable -  SNF Comments - - -  Some recent data might be hidden

## 2019-04-02 NOTE — Progress Notes (Signed)
Pt being discharged home with hospice, discharge instructions reviewed with wife earlier this shift, states understanding, per wife hospice equipment has been delivered to the home, pt being transported at this time via EMS, wife aware of transport, with with no distress or discomfort at discharge

## 2019-04-02 NOTE — Consult Note (Signed)
[facility] Nurse wound consult note Reason for Consult:new Stage 2 areas on coccygeal and right IT areas. Wound type:pressure plus shear Pressure Injury POA: No Measurement:1.5cm round to coccyx, 1cm round to right IT.  Both are <0.1cm deep. Wound PQD:IYME, moist Drainage (amount, consistency, odor) scant serous Periwound:intact, moist Dressing procedure/placement/frequency: We will used silicone foam dressings to mitigate the pressure in the coccygeal and IT areas that are exacerbated by sitting with HOB elevated to breathe.  Patient to discharge with [facility], they have these dressings. He also has a bariatric bed at home. [place] nursing team will not follow, but will remain available to this patient, the nursing and medical teams.  Please re-consult if needed. Thanks, Maudie Flakes, MSN, RN, [place], Arther Abbott  Pager# [phone]

## 2019-04-02 NOTE — Discharge Summary (Signed)
[facility] [place] NAME: Dylan [name]    MR#:  [id number]  DATE OF BIRTH:  1936/05/30  DATE OF ADMISSION:  03/28/2019 ADMITTING PHYSICIAN: Harrie Foreman, MD  DATE OF DISCHARGE: 04/02/2019  PRIMARY CARE PHYSICIAN: Albina Billet, MD   ADMISSION DIAGNOSIS:  Breathing difficulty  DISCHARGE DIAGNOSIS:  Active Problems:   Acute on chronic respiratory failure with hypoxemia (HCC)   Pressure injury of skin   Chronic obstructive pulmonary disease with acute lower respiratory infection (HCC)   SOB (shortness of breath) E. coli urinary tract infection Bacteremia Coronary artery disease  SECONDARY DIAGNOSIS:   Past Medical History:  Diagnosis Date  . CAD (coronary artery disease)   . Cervicalgia   . CHF (congestive heart failure) ([place])   . COPD (chronic obstructive pulmonary disease) ([place])   . Diastolic heart failure ([place])   . Foot drop, right   . History of kidney stones   . Hyperlipidemia    unspecified  . Hypertension   . Myocardial infarction ([place])   . Osteoarthritis   . Shoulder pain, left   . Sleep apnea   . Tremor, essential      ADMITTING HISTORY The patient with past medical history of COPD, CAD, obstructive sleep apnea and hypertension presents to the emergency department via EMS after being found unresponsive at home.  He reportedly had oxygen saturations less than 50% on room air.  The patient was promptly intubated in the emergency department started on broad-spectrum antibiotics prior to the emergency department staff called the hospitalist service for admission.  HOSPITAL COURSE:  Patient was initially admitted to ICU on mechanical ventilation for respiratory failure.  COVID-19 test was negative.  Patient started on broad-spectrum antibiotics.  Subsequently extubated and put on oxygen via nasal cannula and BiPAP at bedtime.  Patient received steroids and aggressive nebulization treatments.  Patient has been  nonambulatory for the last 18 months.  Urine culture grew E. Coli.  Patient received IV meropenem antibiotic during the stay in the hospital.  Palliative care follow the patient and intensivist had a aggressive discussion with the family.  And family do not want any aggressive therapy.  He was made complete DNR.  Patient will be discharged home with hospice services on oral ciprofloxacin antibiotic and supportive care will be continued.  CONSULTS OBTAINED:  Treatment Team:  Yolonda Kida, MD [facility], [facility], MD  DRUG ALLERGIES:  No Known Allergies  DISCHARGE MEDICATIONS:   Allergies as of 04/02/2019   No Known Allergies     Medication List    TAKE these medications   acetaminophen 500 MG tablet Commonly known as: TYLENOL Take 1,000 mg by mouth 2 (two) times daily as needed for moderate pain.   alum & mag hydroxide-simeth 191-478-29 MG/5ML suspension Commonly known as: MAALOX PLUS Take 30 mLs by mouth every 4 (four) hours as needed for indigestion.   aspirin EC 81 MG tablet Take 81 mg by mouth daily.   atorvastatin 80 MG tablet Commonly known as: LIPITOR Take 80 mg by mouth daily.   carvedilol 6.25 MG tablet Commonly known as: COREG Take 6.25 mg by mouth 2 (two) times daily with a meal.   CENTROVITE Tabs Take 1 tablet by mouth daily.   Cholecalciferol 25 MCG (1000 UT) tablet Commonly known as: Vitamin D-1000 Max St Take 1 tablet (1,000 Units total) by mouth daily.   ciprofloxacin 500 MG tablet Commonly known as: Cipro Take 1 tablet (500 mg total)  by mouth 2 (two) times daily for 5 days.   Entresto 24-26 MG Generic drug: sacubitril-valsartan Take 1 tablet by mouth 2 (two) times daily.   FLEET LIQUID GLYCERIN SUPP RE Place 1 suppository rectally every 8 (eight) hours as needed (constipation).   folic acid 400 MCG tablet Commonly known as: V-R FOLIC ACID Take 1 tablet (400 mcg total) by mouth daily.   furosemide 40 MG tablet Commonly known as:  LASIX Take 40 mg by mouth 2 (two) times daily.   gabapentin 100 MG capsule Commonly known as: NEURONTIN Take 100 mg by mouth at bedtime as needed (pain). What changed: Another medication with the same name was removed. Continue taking this medication, and follow the directions you see here.   ipratropium-albuterol 0.5-2.5 (3) MG/3ML Soln Commonly known as: DUONEB Take 3 mLs by nebulization every 6 (six) hours as needed (shortness of breath).   isosorbide mononitrate 30 MG 24 hr tablet Commonly known as: IMDUR Take 30 mg by mouth daily.   ketotifen 0.025 % ophthalmic solution Commonly known as: ZADITOR Place 1 drop into both eyes as needed. What changed: reasons to take this   loratadine 10 MG tablet Commonly known as: CLARITIN Take 1 tablet (10 mg total) by mouth as needed for allergies.   magnesium hydroxide 400 MG/5ML suspension Commonly known as: MILK OF MAGNESIA Take 30 mLs by mouth daily as needed for mild constipation.   morphine CONCENTRATE 10 MG/0.5ML Soln concentrated solution Place 0.25 mLs (5 mg total) under the tongue every 3 (three) hours as needed for severe pain, anxiety or shortness of breath.   nitroGLYCERIN 0.3 MG SL tablet Commonly known as: NITROSTAT Place 0.3 mg under the tongue every 5 (five) minutes as needed for chest pain.   pantoprazole 40 MG tablet Commonly known as: PROTONIX Take 1 tablet (40 mg total) by mouth daily.   polyethylene glycol 17 g packet Commonly known as: MIRALAX / GLYCOLAX Take 17 g by mouth 2 (two) times daily as needed. Hold for loose stools.   potassium chloride SA 20 MEQ tablet Commonly known as: K-DUR Take 1 tablet (20 mEq total) by mouth daily.   primidone 50 MG tablet Commonly known as: MYSOLINE Take 150 mg by mouth 2 (two) times daily.   QUEtiapine 50 MG tablet Commonly known as: SEROQUEL Take 1 tablet (50 mg total) by mouth at bedtime as needed.   sennosides-docusate sodium 8.6-50 MG tablet Commonly known  as: SENOKOT-S Take 2 tablets by mouth daily as needed for constipation.   spironolactone 25 MG tablet Commonly known as: ALDACTONE Take 12.5 mg by mouth daily.   tamsulosin 0.4 MG Caps capsule Commonly known as: FLOMAX 0.4 mg daily.   traMADol 50 MG tablet Commonly known as: ULTRAM Take 50 mg by mouth every 6 (six) hours as needed for moderate pain.   traZODone 50 MG tablet Commonly known as: DESYREL Take 50 mg by mouth at bedtime.   umeclidinium-vilanterol 62.5-25 MCG/INH Aepb Commonly known as: ANORO ELLIPTA Inhale 1 puff into the lungs as needed. What changed: reasons to take this   VITAMIN C PO Take 500 mg by mouth daily.       Today  Patient seen and evaluated on the day of discharge Comfortable on oxygen via nasal cannula No chest pain No shortness of breath No fever Hemodynamically stable Will be discharged home with hospice services VITAL SIGNS:  Blood pressure (!) 154/115, pulse 89, temperature 98.2 F (36.8 C), temperature source Oral, resp. rate (!) 22,  height 5\' 11"  (1.803 m), weight 130 kg, SpO2 96 %.  I/O:    Intake/Output Summary (Last 24 hours) at 04/02/2019 1147 Last data filed at 04/02/2019 0234 Gross per 24 hour  Intake 29.83 ml  Output 800 ml  Net -770.17 ml    PHYSICAL EXAMINATION:  Physical Exam  GENERAL:  83 y.o.-year-old patient lying in the bed with no acute distress.  LUNGS: Normal breath sounds bilaterally, no wheezing, rales,rhonchi or crepitation. No use of accessory muscles of respiration.  CARDIOVASCULAR: S1, S2 normal. No murmurs, rubs, or gallops.  ABDOMEN: Soft, non-tender, non-distended. Bowel sounds present. No organomegaly or mass.  NEUROLOGIC: Moves all 4 extremities. PSYCHIATRIC: The patient is alert and oriented x 3.  SKIN: No obvious rash, lesion, or ulcer.   DATA REVIEW:   CBC Recent Labs  Lab 03/30/19 0446  WBC 11.2*  HGB 11.2*  HCT 36.0*  PLT 173    Chemistries  Recent Labs  Lab 03/29/19 0519   04/02/19 0440  NA 146*   < > 141  K 3.6   < > 3.6  CL 105   < > 104  CO2 31   < > 29  GLUCOSE 99   < > 124*  BUN 34*   < > 33*  CREATININE 1.14   < > 0.93  CALCIUM 8.4*   < > 8.4*  MG 2.5*   < > 2.2  AST 37  --   --   ALT 23  --   --   ALKPHOS 107  --   --   BILITOT 0.9  --   --    < > = values in this interval not displayed.    Cardiac Enzymes No results for input(s): TROPONINI in the last 168 hours.  Microbiology Results  Results for orders placed or performed during the hospital encounter of 03/28/19  Blood Culture (routine x 2)     Status: Abnormal   Collection Time: 03/28/19  2:19 AM   Specimen: BLOOD  Result Value Ref Range Status   Specimen Description   Final    BLOOD RIGHT ANTECUBITAL Performed at [facility], [address] [place], [place] [address]    Special Requests   Final    BOTTLES DRAWN AEROBIC AND ANAEROBIC Blood Culture adequate volume Performed at [facility], [address] [place], [place] [address]    Culture  Setup Time   Final    IN BOTH AEROBIC AND ANAEROBIC BOTTLES GRAM POSITIVE COCCI CRITICAL RESULT CALLED TO, READ BACK BY AND VERIFIED WITH: ASAJAH DUNCAN ON 03/28/19 AT 2108 [facility]    Culture (A)  Final    STAPHYLOCOCCUS SPECIES (COAGULASE NEGATIVE) THE SIGNIFICANCE OF ISOLATING THIS ORGANISM FROM A SINGLE SET OF BLOOD CULTURES WHEN MULTIPLE SETS ARE DRAWN IS UNCERTAIN. PLEASE NOTIFY THE MICROBIOLOGY DEPARTMENT WITHIN ONE WEEK IF SPECIATION AND SENSITIVITIES ARE REQUIRED. Performed at [facility], [address], [place], [place] [address]    Report Status 03/30/2019 FINAL  Final  Blood Culture (routine x 2)     Status: None   Collection Time: 03/28/19  2:19 AM   Specimen: BLOOD  Result Value Ref Range Status   Specimen Description BLOOD RIGHT ARM  Final   Special Requests   Final    BOTTLES DRAWN AEROBIC AND ANAEROBIC Blood Culture results may not be optimal due to an excessive volume of blood received in  culture bottles   Culture   Final    NO  GROWTH 5 DAYS Performed at [facility], [address] [place], [place] [address]    Report Status 04/02/2019 FINAL  Final  Urine culture     Status: Abnormal   Collection Time: 03/28/19  2:19 AM   Specimen: Urine, Random  Result Value Ref Range Status   Specimen Description   Final    URINE, RANDOM Performed at [facility], [address] [place], [place] [address]    Special Requests   Final    NONE Performed at [facility], [address] [place], [place] [address]    Culture >=100,000 COLONIES/mL ESCHERICHIA COLI (A)  Final   Report Status 03/31/2019 FINAL  Final   Organism ID, Bacteria ESCHERICHIA COLI (A)  Final      Susceptibility   Escherichia coli - MIC*    AMPICILLIN >=32 RESISTANT Resistant     CEFAZOLIN >=64 RESISTANT Resistant     CEFTRIAXONE >=64 RESISTANT Resistant     CIPROFLOXACIN 1 SENSITIVE Sensitive     GENTAMICIN <=1 SENSITIVE Sensitive     IMIPENEM RESISTANT Resistant     NITROFURANTOIN <=16 SENSITIVE Sensitive     TRIMETH/SULFA >=320 RESISTANT Resistant     AMPICILLIN/SULBACTAM >=32 RESISTANT Resistant     PIP/TAZO >=128 RESISTANT Resistant     Extended ESBL NEGATIVE Sensitive     * >=100,000 COLONIES/mL ESCHERICHIA COLI  SARS Coronavirus 2 (CEPHEID- Performed in [facility] hospital lab), Hosp Order     Status: None   Collection Time: 03/28/19  2:19 AM   Specimen: Urine, Unspecified Source; Nasopharyngeal  Result Value Ref Range Status   SARS Coronavirus 2 NEGATIVE NEGATIVE Final    Comment: (NOTE) If result is NEGATIVE SARS-CoV-2 target nucleic acids are NOT DETECTED. The SARS-CoV-2 RNA is generally detectable in upper and lower  respiratory specimens during the acute phase of infection. The lowest  concentration of SARS-CoV-2 viral copies this assay can detect is 250  copies / mL. A negative result does not preclude SARS-CoV-2 infection  and should not be used as the  sole basis for treatment or other  patient management decisions.  A negative result may occur with  improper specimen collection / handling, submission of specimen other  than nasopharyngeal swab, presence of viral mutation(s) within the  areas targeted by this assay, and inadequate number of viral copies  (<250 copies / mL). A negative result must be combined with clinical  observations, patient history, and epidemiological information. If result is POSITIVE SARS-CoV-2 target nucleic acids are DETECTED. The SARS-CoV-2 RNA is generally detectable in upper and lower  respiratory specimens dur ing the acute phase of infection.  Positive  results are indicative of active infection with SARS-CoV-2.  Clinical  correlation with patient history and other diagnostic information is  necessary to determine patient infection status.  Positive results do  not rule out bacterial infection or co-infection with other viruses. If result is PRESUMPTIVE POSTIVE SARS-CoV-2 nucleic acids MAY BE PRESENT.   A presumptive positive result was obtained on the submitted specimen  and confirmed on repeat testing.  While 2019 novel coronavirus  (SARS-CoV-2) nucleic acids may be present in the submitted sample  additional confirmatory testing may be necessary for epidemiological  and / or clinical management purposes  to differentiate between  SARS-CoV-2 and other Sarbecovirus currently known to infect humans.  If clinically indicated additional testing with an alternate test  methodology [phone] is advised. The SARS-CoV-2 RNA is generally  detectable in upper and lower respiratory  sp ecimens during the acute  phase of infection. The expected result is Negative. Fact Sheet for Patients:  BoilerBrush.com.cyhttps://www.fda.gov/media/136312/download Fact Sheet for Healthcare Providers: https://pope.com/https://www.fda.gov/media/136313/download This test is not yet approved or cleared by the [place] FDA and has been authorized for detection  and/or diagnosis of SARS-CoV-2 by FDA under an Emergency Use Authorization (EUA).  This EUA will remain in effect (meaning this test can be used) for the duration of the COVID-19 declaration under Section 564(b)(1) of the Act, 21 U.S.C. section 360bbb-3(b)(1), unless the authorization is terminated or revoked sooner. Performed at [facility], [address] [place], [place] [address]   Blood Culture ID Panel (Reflexed)     Status: Abnormal   Collection Time: 03/28/19  2:19 AM  Result Value Ref Range Status   Enterococcus species NOT DETECTED NOT DETECTED Final   Listeria monocytogenes NOT DETECTED NOT DETECTED Final   Staphylococcus species DETECTED (A) NOT DETECTED Final    Comment: Methicillin (oxacillin) resistant coagulase negative staphylococcus. Possible blood culture contaminant (unless isolated from more than one blood culture draw or clinical case suggests pathogenicity). No antibiotic treatment is indicated for blood  culture contaminants. CRITICAL RESULT CALLED TO, READ BACK BY AND VERIFIED WITH: ASAJAH DUNCAN ON 03/28/19 AT 2108 [facility]    Staphylococcus aureus (BCID) NOT DETECTED NOT DETECTED Final   Methicillin resistance DETECTED (A) NOT DETECTED Final    Comment: CRITICAL RESULT CALLED TO, READ BACK BY AND VERIFIED WITH: ASAJAH DUNCAN ON 03/28/19 AT 2108 [facility]    Streptococcus species NOT DETECTED NOT DETECTED Final   Streptococcus agalactiae NOT DETECTED NOT DETECTED Final   Streptococcus pneumoniae NOT DETECTED NOT DETECTED Final   Streptococcus pyogenes NOT DETECTED NOT DETECTED Final   Acinetobacter baumannii NOT DETECTED NOT DETECTED Final   Enterobacteriaceae species NOT DETECTED NOT DETECTED Final   Enterobacter cloacae complex NOT DETECTED NOT DETECTED Final   Escherichia coli NOT DETECTED NOT DETECTED Final   Klebsiella oxytoca NOT DETECTED NOT DETECTED Final   Klebsiella pneumoniae NOT DETECTED NOT DETECTED Final   Proteus species NOT DETECTED NOT  DETECTED Final   Serratia marcescens NOT DETECTED NOT DETECTED Final   Haemophilus influenzae NOT DETECTED NOT DETECTED Final   Neisseria meningitidis NOT DETECTED NOT DETECTED Final   Pseudomonas aeruginosa NOT DETECTED NOT DETECTED Final   Candida albicans NOT DETECTED NOT DETECTED Final   Candida glabrata NOT DETECTED NOT DETECTED Final   Candida krusei NOT DETECTED NOT DETECTED Final   Candida parapsilosis NOT DETECTED NOT DETECTED Final   Candida tropicalis NOT DETECTED NOT DETECTED Final    Comment: Performed at [facility], [address] [place], [place] [address]  MRSA PCR Screening     Status: Abnormal   Collection Time: 03/28/19  5:47 AM   Specimen: Nasal Mucosa; Nasopharyngeal  Result Value Ref Range Status   MRSA by PCR POSITIVE (A) NEGATIVE Final    Comment:        The GeneXpert MRSA Assay (FDA approved for NASAL specimens only), is one component of a comprehensive MRSA colonization surveillance program. It is not intended to diagnose MRSA infection nor to guide or monitor treatment for MRSA infections. RESULT CALLED TO, READ BACK BY AND VERIFIED WITH: KAYLA FOSTER @0731  ON 03/28/2019 BY FMW Performed at [facility], [address] [place], [place] [address]     RADIOLOGY:  No results found.  Follow up with PCP in 1 week.  Management plans discussed with the patient, family and they are  in agreement.  CODE STATUS: DNR    Code Status Orders  (From admission, onward)         Start     Ordered   04/01/19 1017  Do not attempt resuscitation (DNR)  Continuous    Question Answer Comment  In the event of cardiac or respiratory ARREST Do not call a "code blue"   In the event of cardiac or respiratory ARREST Do not perform Intubation, CPR, defibrillation or ACLS   In the event of cardiac or respiratory ARREST Use medication by any route, position, wound care, and other measures to relive pain and suffering. May use oxygen, suction and  manual treatment of airway obstruction as needed for comfort.      04/01/19 1018        Code Status History    Date Active Date Inactive Code Status Order ID Comments User Context   03/31/2019 1957 04/01/2019 1018 Partial Code [id number]  Judithe Modest, NP Inpatient   03/28/2019 0517 03/31/2019 1956 Full Code [id number]  Arnaldo Natal, MD Inpatient   02/21/2019 1125 02/24/2019 2310 Full Code [id number]  Alford Highland, MD ED   01/30/2019 1805 02/03/2019 1754 Full Code [id number]  Jama Flavors, MD ED   11/23/2018 0619 12/01/2018 1541 Full Code [id number]  Arnaldo Natal, MD Inpatient   11/14/2018 0314 11/19/2018 1822 Full Code [id number]  Mansy, Vernetta Honey, MD ED   10/27/2018 2101 10/31/2018 2110 Partial Code [id number]  Altamese Dilling, MD Inpatient   08/26/2018 1137 09/04/2018 1912 Full Code [id number]  Kennedy Bucker, MD ED   06/21/2018 1321 06/24/2018 2008 Partial Code [id number]  Milagros Loll, MD Inpatient   06/21/2018 0803 06/21/2018 1321 Full Code [id number]  Iran Ouch, MD Inpatient   06/01/2018 2009 06/04/2018 1718 Full Code [id number]  Donato Heinz, MD Inpatient   01/11/2018 0012 01/14/2018 1908 Full Code [id number]  Cammy Copa, MD Inpatient   12/01/2017 1924 12/05/2017 1948 Full Code [id number]  Altamese Dilling, MD Inpatient   08/31/2017 0557 09/05/2017 1959 Full Code [id number]  Arnaldo Natal, MD Inpatient   06/10/2016 1934 06/13/2016 1816 Full Code [id number]  Shaune Pollack, MD Inpatient   12/15/2015 0134 12/16/2015 1507 Full Code [id number]  Ihor Austin, MD ED   Advance Care Planning Activity      TOTAL TIME TAKING CARE OF THIS PATIENT ON DAY OF DISCHARGE: more than 34 minutes.   Ihor Austin M.D on 04/02/2019 at 11:47 AM  Between 7am to 6pm - Pager - [phone]  After 6pm go to [email or web address] - password [email or web address] [facility]  [facility]  Office  [phone]  CC: Primary care physician; [name], MD  Note: This dictation was prepared with Dragon dictation  along with smaller phrase technology. Any transcriptional errors that result from this process are unintentional.

## 2019-04-02 NOTE — Progress Notes (Signed)
Visit made. Patient seen sleeping, wife [name] at bedside. She reports he did not sleep well last night. He was alert for breakfast and did eat better this morning. Patient appears to be much more comfortable this morning. [name] did report some confusion, patent will start a sentence and trail off. We did discuss some side effects of the liquid morphine. Plan si for discharge home today vis EMS. Liquid morphine prescription and oral cipro prescriptions have been sent to pharmacy. Hospice triage RN made aware. AVS copy faxed to triage. Hospice team alerted to discharge. Writer requested that [facility] call hospice when patient arrives home. Will continue to follow through discharge. Flo Shanks BSN, RN, [place] [facility] [phone]

## 2019-04-02 NOTE — Progress Notes (Signed)
Daily Progress Note   Patient Name: Dylan [name]       Date: 04/02/2019 DOB: Nov 23, 1935  Age: 83 y.o. MRN#: [id number] Attending Physician: Ihor AustinPyreddy, Pavan, MD Primary Care Physician: Jaclyn Shaggyate, [name], MD Admit Date: 03/28/2019  Reason for Consultation/Follow-up: Establishing goals of care  Subjective: Patient wakes to voice. Answers questions with simple answers. Reports he slept well last night and denies pain or shortness of breath this morning. Ate some of breakfast. Hopes to go home but "in no rush."   Notes reviewed from GOC discussion yesterday. No family at bedside.  Length of Stay: 5  Current Medications: Scheduled Meds:  . Chlorhexidine Gluconate Cloth  6 each Topical Q0600  . enoxaparin (LOVENOX) injection  40 mg Subcutaneous Daily  . feeding supplement (ENSURE ENLIVE)  237 mL Oral TID BM  . furosemide  40 mg Intravenous BID  . ipratropium-albuterol  3 mL Nebulization TID  . midodrine  10 mg Oral TID  . mirtazapine  15 mg Oral QHS  . morphine CONCENTRATE  10 mg Oral Q4H  . multivitamin-lutein  1 capsule Oral Daily  . mupirocin ointment   Nasal BID  . pantoprazole  40 mg Oral Daily    Continuous Infusions: . sodium chloride Stopped (04/01/19 1700)    PRN Meds: sodium chloride, acetaminophen **OR** acetaminophen, albuterol, hydrALAZINE, morphine CONCENTRATE, ondansetron **OR** ondansetron (ZOFRAN) IV, traMADol, traZODone  Physical Exam Vitals signs and nursing note reviewed.  Constitutional:      Appearance: He is ill-appearing.  HENT:     Head: Normocephalic and atraumatic.  Cardiovascular:     Rate and Rhythm: Regular rhythm.  Pulmonary:     Effort: No tachypnea, accessory muscle usage or respiratory distress.     Breath sounds: Decreased breath sounds present.      Comments: 2L Ray Abdominal:     Tenderness: There is no abdominal tenderness.  Skin:    General: Skin is warm and dry.  Neurological:     Mental Status: He is alert and easily aroused.     Comments: Oriented to person, place. situation  Psychiatric:        Mood and Affect: Mood normal.        Speech: Speech normal.            Vital Signs: BP (!) 154/115 (BP  Location: Left Arm)   Pulse 89   Temp 98.2 F (36.8 C) (Oral)   Resp (!) 22   Ht 5\' 11"  (1.803 m)   Wt 130 kg   SpO2 92%   BMI 39.97 kg/m  SpO2: SpO2: 92 % O2 Device: O2 Device: Nasal Cannula O2 Flow Rate: O2 Flow Rate (L/min): 2 L/min  Intake/output summary:   Intake/Output Summary (Last 24 hours) at 04/02/2019 0902 Last data filed at 04/02/2019 0234 Gross per 24 hour  Intake 42.16 ml  Output 1550 ml  Net -1507.84 ml   LBM: Last BM Date: 03/28/19 Baseline Weight: Weight: 136.1 kg Most recent weight: Weight: 130 kg       Palliative Assessment/Data: PPS 30%      Patient Active Problem List   Diagnosis Date Noted  . SOB (shortness of breath)   . Chronic obstructive pulmonary disease with acute lower respiratory infection (HCC)   . Acute on chronic respiratory failure with hypoxemia (HCC) 03/28/2019  . Pressure injury of skin 03/28/2019  . Acute respiratory failure with hypoxemia (HCC) 11/23/2018  . Goals of care, counseling/discussion   . Palliative care by specialist   . DNR (do not resuscitate) discussion   . Acute CHF (congestive heart failure) (HCC) 11/14/2018  . Acute on chronic systolic CHF (congestive heart failure) (HCC) 10/27/2018  . HCAP (healthcare-associated pneumonia) 10/27/2018  . Hypokalemia 10/01/2018  . Hypertensive heart and kidney disease with acute on chronic diastolic congestive heart failure and stage 3 chronic kidney disease (HCC) 09/09/2018  . Chronic constipation 09/09/2018  . BPH with obstruction/lower urinary tract symptoms 09/09/2018  . Obstructive sleep apnea of adult  09/09/2018  . Idiopathic peripheral neuropathy 09/09/2018  . Chronic kidney disease, stage 3, mod decreased GFR (HCC) 09/09/2018  . Anemia of chronic renal failure, stage 3 (moderate) (HCC) 09/09/2018  . Periprosthetic fracture around internal prosthetic left knee joint 08/26/2018  . Acute ST elevation myocardial infarction (STEMI) of inferior wall (HCC) 06/21/2018  . S/P total knee arthroplasty 06/01/2018  . Primary osteoarthritis of left knee 02/05/2018  . Sepsis (HCC) 01/10/2018  . Community acquired pneumonia 12/01/2017  . Pneumonia 12/01/2017  . Hyperlipidemia 09/23/2017  . Weakness 08/31/2017  . Benign essential tremor 10/18/2016  . Class 3 severe obesity due to excess calories with serious comorbidity and body mass index (BMI) of 45.0 to 49.9 in adult (HCC) 10/18/2016  . Varicose veins of lower extremities with ulcer (HCC) 06/17/2016  . Chronic venous insufficiency 06/17/2016  . Lymphedema 06/17/2016  . Acute on chronic diastolic heart failure (HCC) 06/10/2016  . COPD, severe (HCC) 05/24/2016  . AV block, Mobitz 1 02/09/2016  . Coronary atherosclerosis of native coronary artery 02/09/2016  . ST elevation MI (STEMI) (HCC) 02/08/2016  . Right foot drop 04/07/2014  . Rotator cuff rupture, complete 04/22/2013  . Neck pain 04/14/2013    Palliative Care Assessment & Plan   Patient Profile: 83 y.o. male  with past medical history of chronic respiratory failure, COPD on home oxygen and trilogy, CHF EF 35-40%, CAD, MI, HTN, HLD, OSA, hip fracture December 2019, current hospice patient admitted on 03/28/2019 with unresponsiveness and hypoxia. Patient required intubation and ICU admission for acute hypoxic respiratory failure with underlying COPD and CHF. Extubated to [facility] 7/27. Urine culture positive for E. Coli receiving IV antibiotics. Followed by [facility] hosp[facility]. Palliative medicine for goals of care.  Assessment: Acute on chronic respiratory failure UTI due to E.Coli  COPD CHF EF 35-40% CAD Hypertension  Recommendations/Plan:  [place] discussion 7/30 with PMT NP, PCCM, and hospice liaison. Patient/family decision for DNR and comfort focused care plan. Return home with hospice services when medically optimized. Patient appears stable and comfortable this AM.   Continue symptom management regimen.  Followed by [facility] hospice.  Code Status: DNR   Code Status Orders  (From admission, onward)         Start     Ordered   03/28/19 0517  Full code  Continuous     03/28/19 0516        Code Status History    Date Active Date Inactive Code Status Order ID Comments User Context   02/21/2019 1125 02/24/2019 2310 Full Code [id number]  Loletha Grayer, MD ED   01/30/2019 1805 02/03/2019 1754 Full Code [id number]  Otila Back, MD ED   11/23/2018 0619 12/01/2018 1541 Full Code [id number]  Harrie Foreman, MD Inpatient   11/14/2018 0314 11/19/2018 1822 Full Code [id number]  Mansy, Arvella Merles, MD ED   10/27/2018 2101 10/31/2018 2110 Partial Code [id number]  Vaughan Basta, MD Inpatient   08/26/2018 1137 09/04/2018 1912 Full Code [id number]  Hessie Knows, MD ED   06/21/2018 1321 06/24/2018 2008 Partial Code [id number]  Hillary Bow, MD Inpatient   06/21/2018 0803 06/21/2018 1321 Full Code [id number]  Wellington Hampshire, MD Inpatient   06/01/2018 2009 06/04/2018 1718 Full Code [id number]  Dereck Leep, MD Inpatient   01/11/2018 0012 01/14/2018 1908 Full Code [id number]  Amelia Jo, MD Inpatient   12/01/2017 1924 12/05/2017 1948 Full Code [id number]  Vaughan Basta, MD Inpatient   08/31/2017 0557 09/05/2017 1959 Full Code [id number]  Harrie Foreman, MD Inpatient   06/10/2016 1934 06/13/2016 1816 Full Code [id number]  Demetrios Loll, MD Inpatient   12/15/2015 0134 12/16/2015 1507 Full Code [id number]  Saundra Shelling, MD ED   Advance Care Planning Activity       Prognosis:  Poor prognosis with acute on chronic respiratory failure, COPD, CHF  Discharge  Planning:  Home with Hospice  Care plan was discussed with patient, RN  Thank you for allowing the Palliative Medicine Team to assist in the care of this patient.   Time In: 0845 Time Out: 0900 Total Time 15 Prolonged Time Billed no      Greater than 50%  of this time was spent counseling and coordinating care related to the above assessment and plan.  Ihor Dow, DNP, FNP-C Palliative Medicine Team  Phone: [phone] Fax: [phone]  Please contact Palliative Medicine Team phone at [phone] for questions and concerns.

## 2019-04-12 ENCOUNTER — Telehealth (HOSPITAL_COMMUNITY): Payer: Self-pay

## 2019-04-12 NOTE — Telephone Encounter (Signed)
Today spoke with Dylan [name]'s wife [name].  She states Hospice is coming a couple times a week.  He has been off most medications, taking his heart pills and lasix only.  He is not on comfort meds yet but getting to the point shortly.  He is still alert but not eating well.  Mostly liquid diet or soft foods.  They get him up a couple times a day to his recliner.  They are trying to make him comfortable as he can be.  He is aware of what is going on.  [name] is worried about going back to work tomorrow and would like a visit with [name] tomorrow to ease her mind.  Will go out for visit tomorrow and contact her with anything concerning.  Hospice came today and will not come tomorrow.  He has a 24 hour aid that sits with him and cares for him.  Will visit tomorrow.   [place] [phone]

## 2019-04-13 ENCOUNTER — Other Ambulatory Visit (HOSPITAL_COMMUNITY): Payer: Self-pay

## 2019-05-04 ENCOUNTER — Encounter (HOSPITAL_COMMUNITY): Payer: Self-pay

## 2019-05-04 NOTE — Progress Notes (Signed)
Today was a visit at [facility].  He has hospice coming in now.  He appears very weak and only able to answer simple questions.  He is on pain medications for comfort and only his heart and breathing medications.  He is not sleeping throughout the night.  Family is very tired.  He has 24 hour aid that stays with him to help.  He is mostly in the bed, some days gets up to recliner.  Vitals stable.  Legs does not appear to have a lot of edema, they are giving him his lasix.  Patient is declining in health and family has come to realize that, they are in close contact with Hospice.  Will continue to support the family in anyway I can.  Reported to wife of findings for this is her first day back at work.  Advised her to call if I can help.   [place] [phone]

## 2019-05-18 ENCOUNTER — Telehealth (HOSPITAL_COMMUNITY): Payer: Self-pay

## 2019-05-18 NOTE — Telephone Encounter (Signed)
Attempted to contact, no answer left message.  Will continue to contact.   Zale Marcotte  EMT-Paramedic [phone] 

## 2019-06-03 DEATH — deceased

## 2019-06-15 ENCOUNTER — Ambulatory Visit: Payer: Medicare Other | Admitting: Family

## 2019-06-24 ENCOUNTER — Telehealth (HOSPITAL_COMMUNITY): Payer: Self-pay

## 2019-06-24 NOTE — Telephone Encounter (Signed)
Jerrys wife [name] contacted me and wanted to thank me for all the help with [name].  She states he died 2023-05-25 at home.   [place] [phone]

## 2019-12-05 IMAGING — DX PORTABLE CHEST - 1 VIEW
1 series · 1 of 1 positions shown · non-contrast
Comparison: 11/23/2018

CLINICAL DATA: Acute respiratory failure

EXAM:
PORTABLE CHEST 1 VIEW

[chest ap]
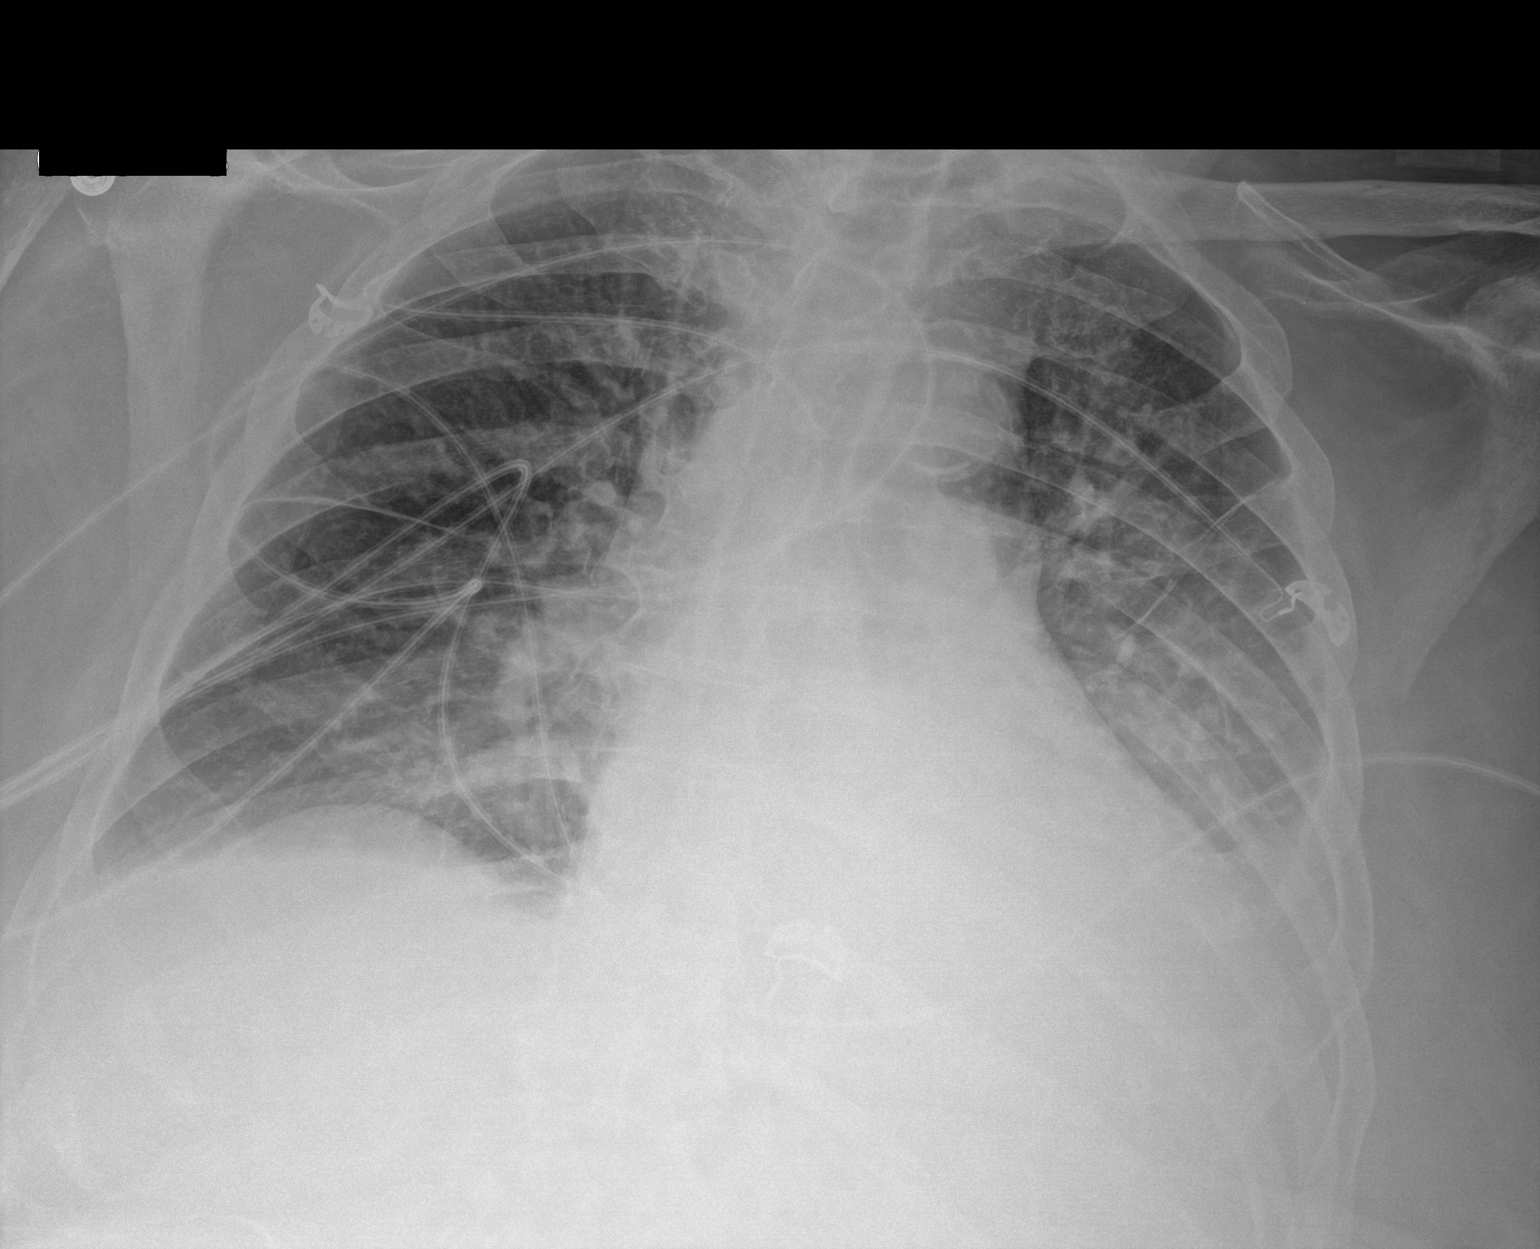

[1 of 1 positions shown; findings below may reference images not displayed]

FINDINGS: Interval extubation and removal of enteric tube.

No frank interstitial edema. Moderate left and small right pleural
effusions. No pneumothorax.

Heart is top-normal in size.
IMPRESSION: Interval extubation.

No frank interstitial edema. Moderate left and small right pleural
effusions, improved.

## 2019-12-07 IMAGING — CT CT HEAD WITHOUT CONTRAST
3 series · 16 of 47 positions shown, 19 images · non-contrast
Comparison: 08/26/2018

CLINICAL DATA: Fatigue and malaise

EXAM:
CT HEAD WITHOUT CONTRAST
TECHNIQUE: Contiguous axial images were obtained from the base of the skull
through the vertex without intravenous contrast.

[Series 2: head wo · axial · 0.46mm/px · z∈[-123,+2]mm · 10 of 31 slices shown, 13 images]
[im 3/31  brain]
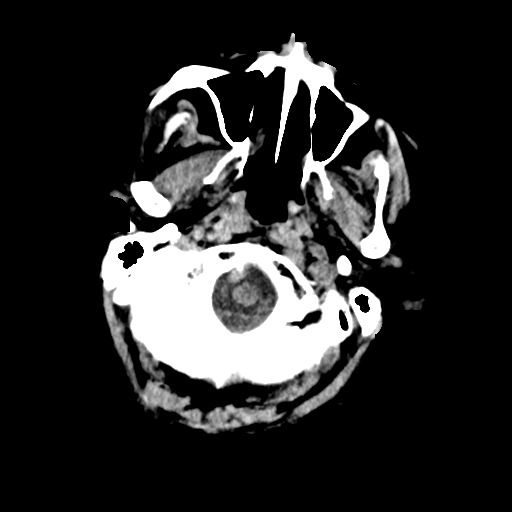
[im 3/31  bone]
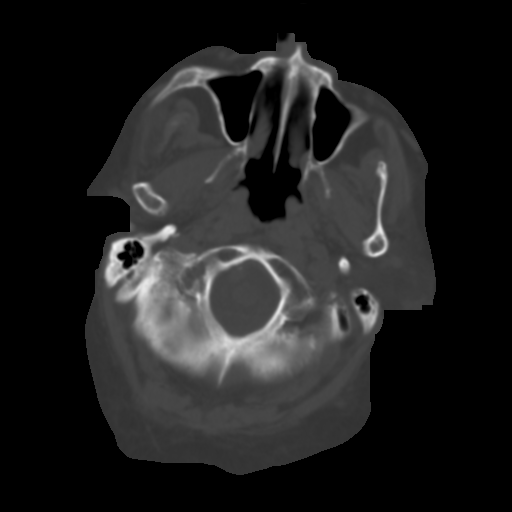
[im 6/31  brain]
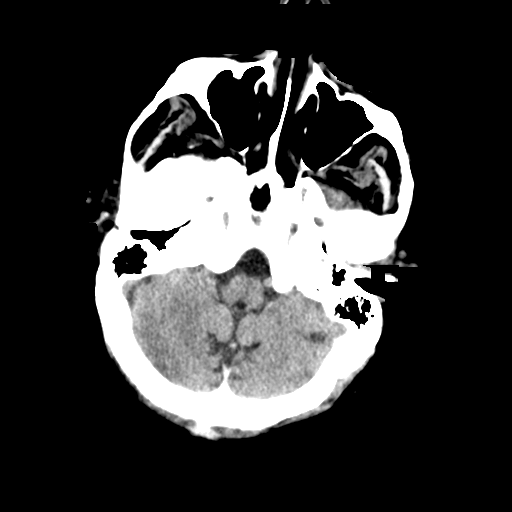
[im 9/31  brain]
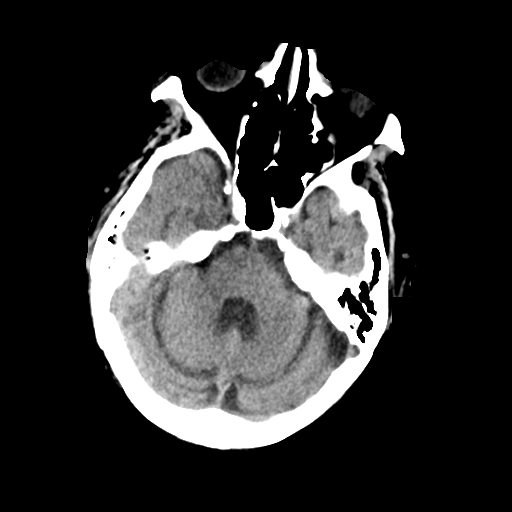
[im 11/31  brain]
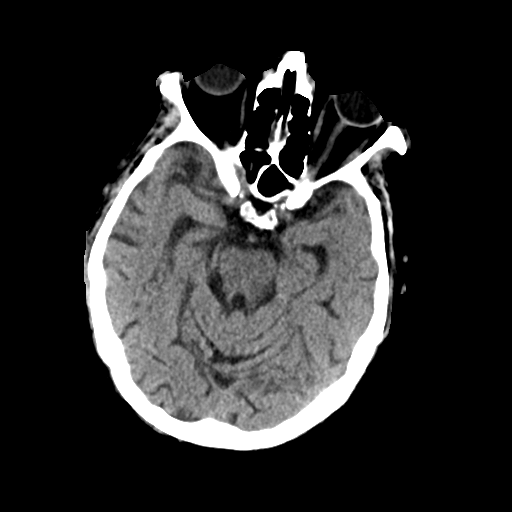
[im 14/31  brain]
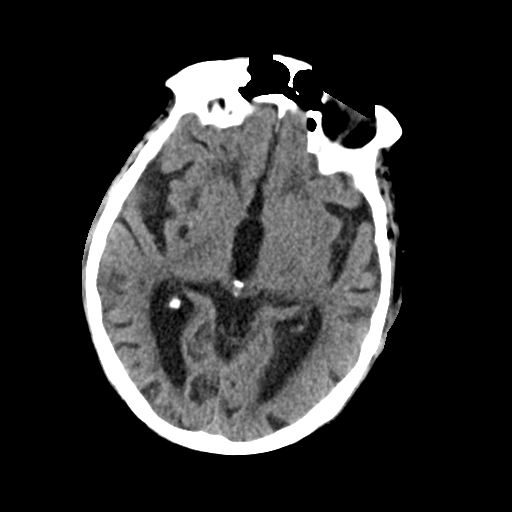
[im 14/31  bone]
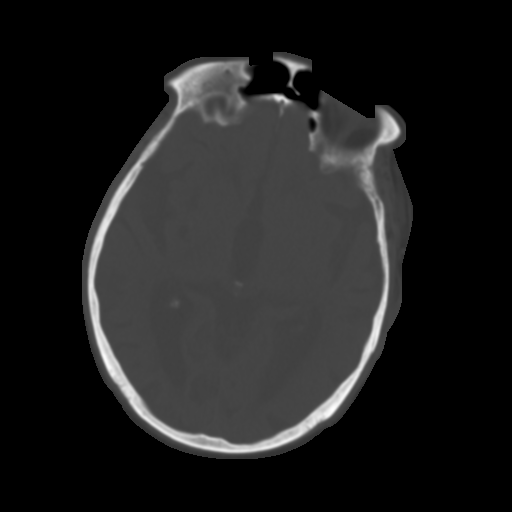
[im 17/31  brain]
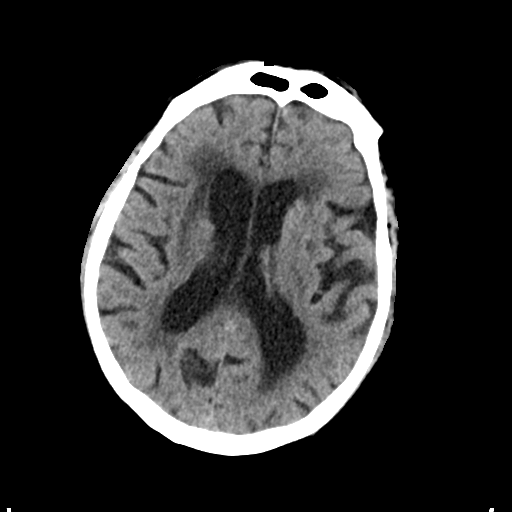
[im 20/31  brain]
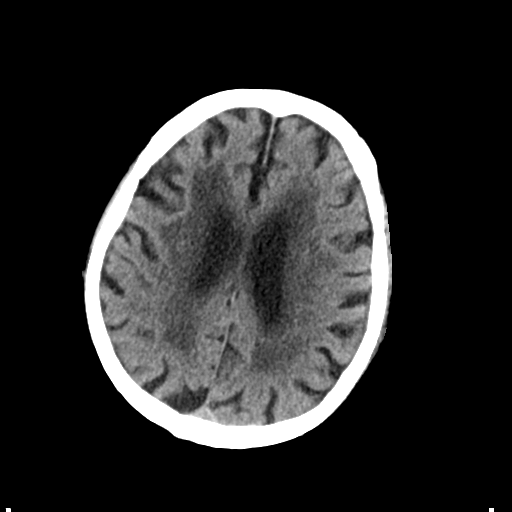
[im 23/31  brain]
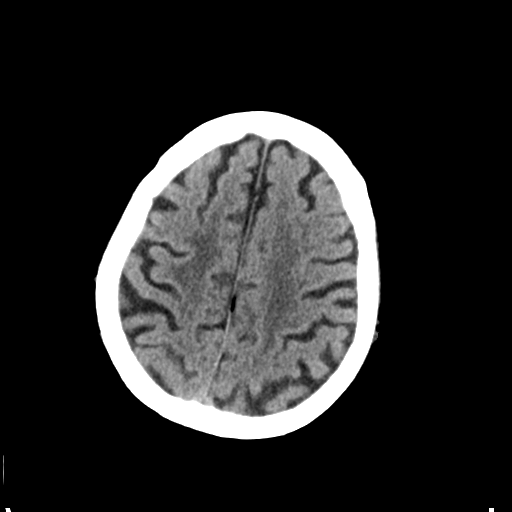
[im 25/31  brain]
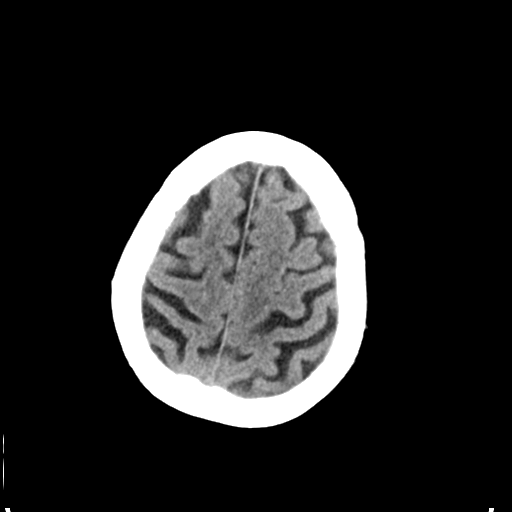
[im 25/31  bone]
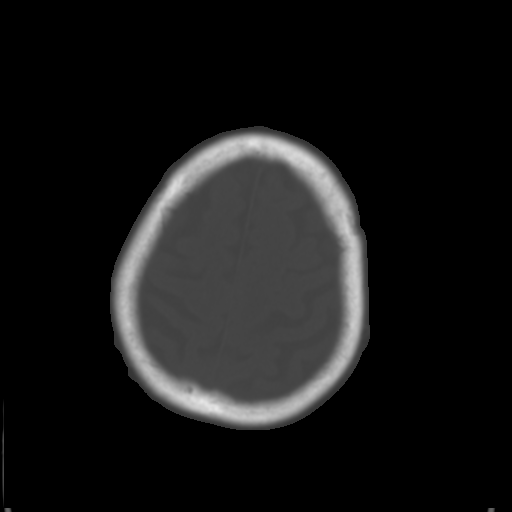
[im 28/31  brain]
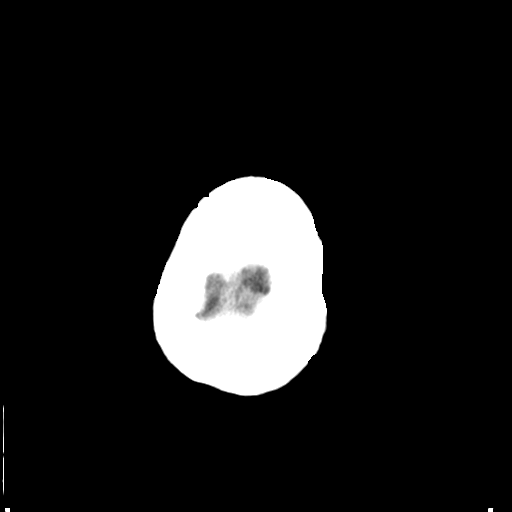

[Series 4: coronal soft tissue · coronal · 0.30mm/px · 3 of 63 slices shown]
[im 21/63  brain]
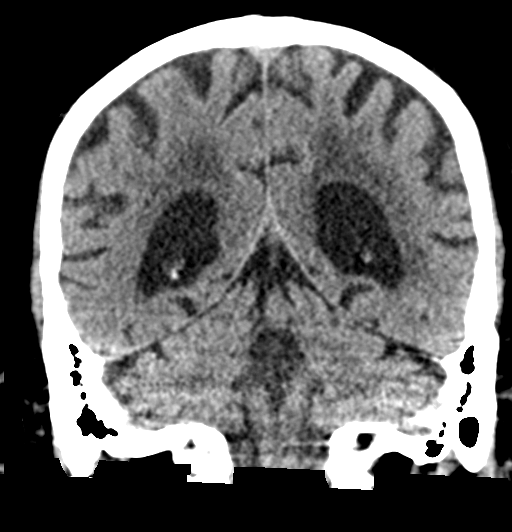
[im 28/63  brain]
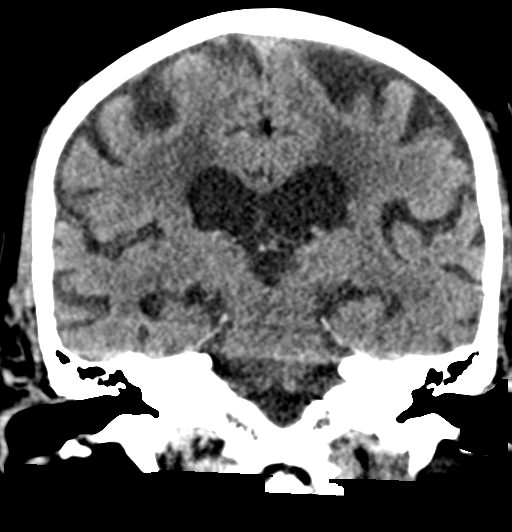
[im 35/63  brain]
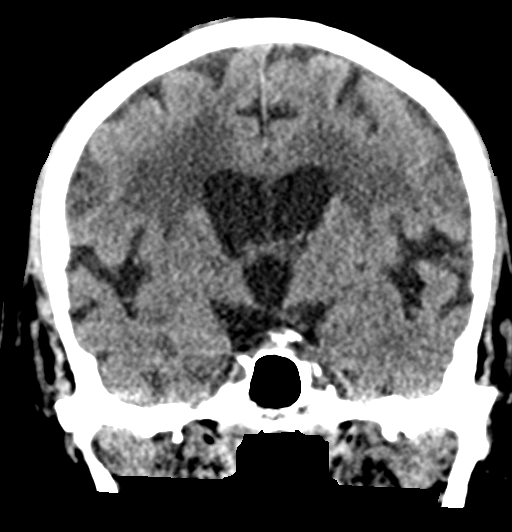

[Series 5: sagittal soft tissue · sagittal · 0.31mm/px · 3 of 51 slices shown]
[im 17/51  brain]
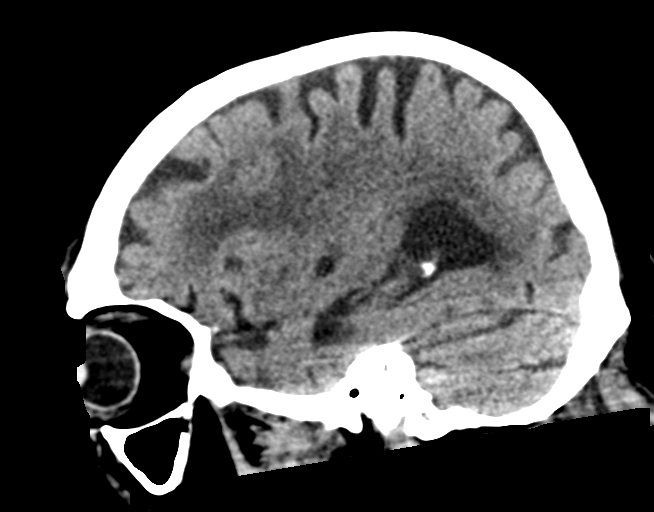
[im 26/51  brain]
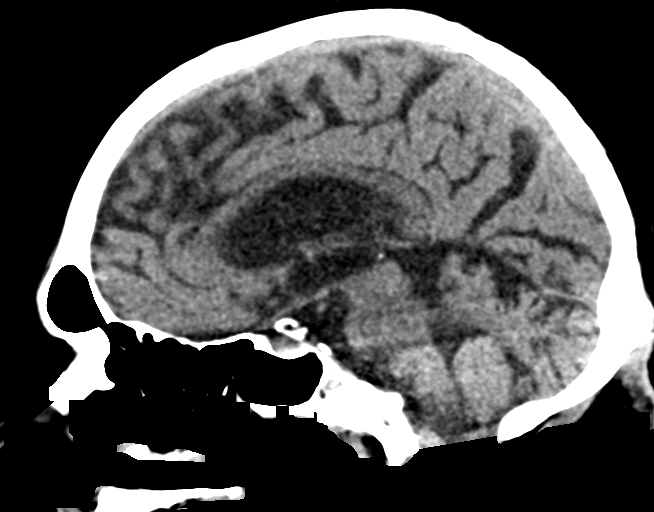
[im 34/51  brain]
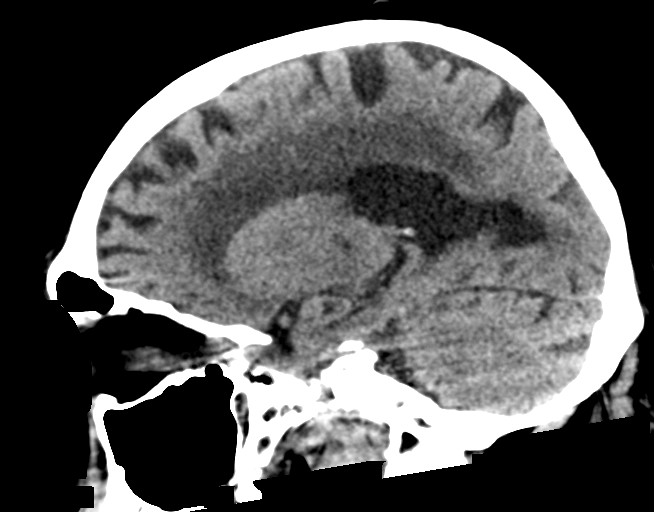

[16 of 47 positions shown; findings below may reference images not displayed]

FINDINGS: Brain: No evidence of acute infarction, hemorrhage, hydrocephalus,
extra-axial collection or mass lesion/mass effect. Confluent
low-density in the cerebral white matter. Remote small vessel
infarcts in the bilateral deep gray nuclei. Generalized volume loss.

Vascular: Atherosclerotic calcification.  No hyperdense vessel

Skull: Negative

Sinuses/Orbits: Negative
IMPRESSION: 1. No acute finding or change from prior.
2. Atrophy and chronic small vessel ischemia.

## 2019-12-07 IMAGING — US US THORACENTESIS ASP PLEURAL SPACE W/IMG GUIDE
1 series · 2 of 2 positions shown · non-contrast
Comparison: none

INDICATION: Left-sided pleural effusion

[Series 1: us thoracentesis asp pleural space w/img guide · 2 of 2 slices shown]
[im 1/2]
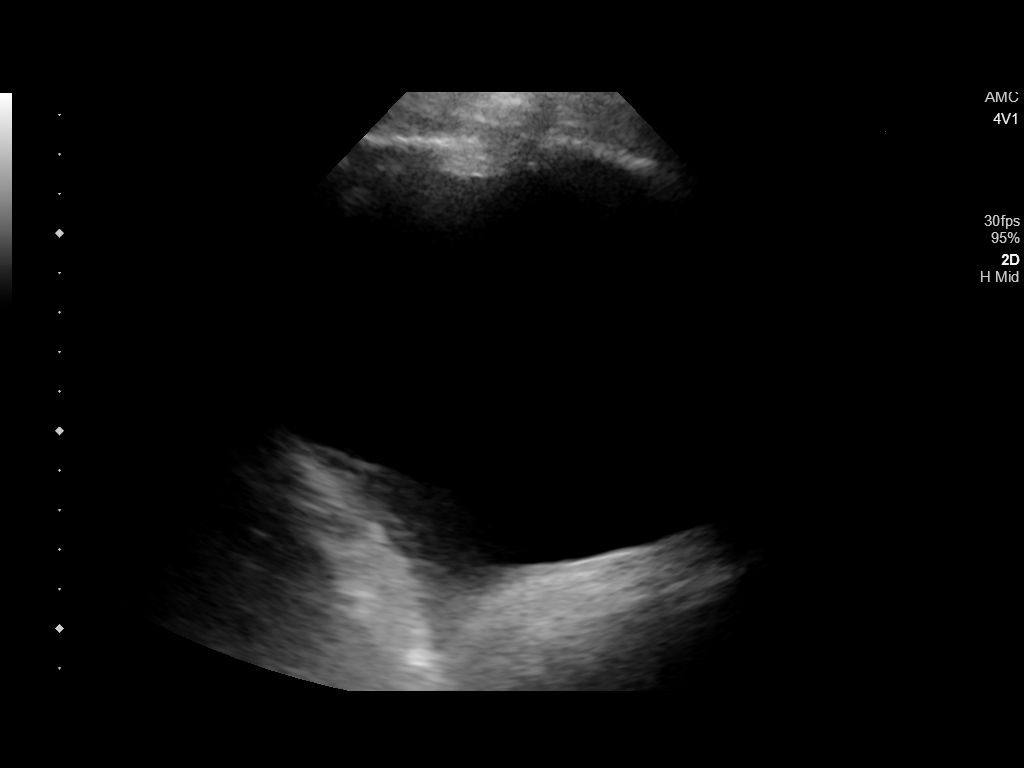
[im 2/2]
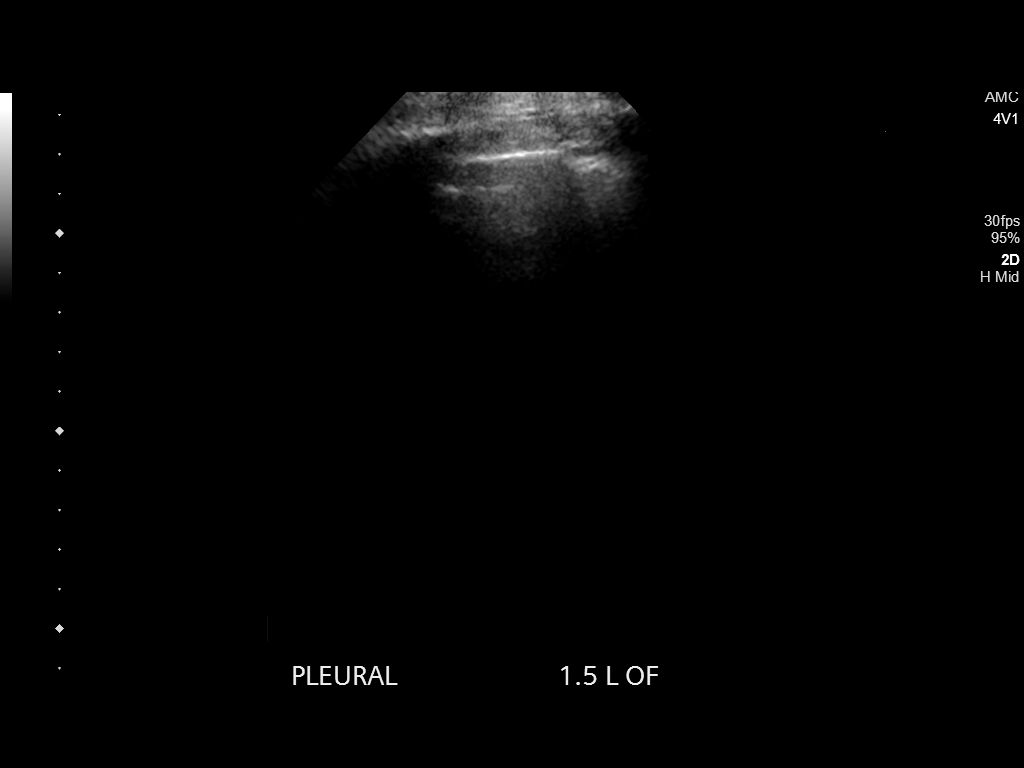

[2 of 2 positions shown; findings below may reference images not displayed]

EXAM:
ULTRASOUND GUIDED LEFT THORACENTESIS

MEDICATIONS:
None.

COMPLICATIONS:
None immediate.

PROCEDURE:
An ultrasound guided thoracentesis was thoroughly discussed with the
patient's wife and questions answered. The benefits, risks,
alternatives and complications were also discussed. The patient
understands and wishes to proceed with the procedure. Written
consent was obtained.

Ultrasound was performed to localize and mark an adequate pocket of
fluid in the left chest. The area was then prepped and draped in the
normal sterile fashion. 1% Lidocaine was used for local anesthesia.
Under ultrasound guidance a 6 Fr Safe-T-Centesis catheter was
introduced. Thoracentesis was performed. The catheter was removed
and a dressing applied.
FINDINGS: A total of approximately 1.5 L of brownish fluid was removed.
Samples were sent to the laboratory as requested by the clinical
team.
IMPRESSION: Successful ultrasound guided left thoracentesis yielding 1.5 L of
pleural fluid.
